# Patient Record
Sex: Female | Born: 1950
Health system: Southern US, Community
[De-identification: ages and names within clinical notes are randomized; demographics above are authoritative.]

## PROBLEM LIST (undated history)

## (undated) DIAGNOSIS — I951 Orthostatic hypotension: Secondary | ICD-10-CM

## (undated) DIAGNOSIS — Z950 Presence of cardiac pacemaker: Secondary | ICD-10-CM

## (undated) DIAGNOSIS — J45909 Unspecified asthma, uncomplicated: Secondary | ICD-10-CM

## (undated) DIAGNOSIS — I495 Sick sinus syndrome: Secondary | ICD-10-CM

## (undated) DIAGNOSIS — S149XXA Injury of unspecified nerves of neck, initial encounter: Secondary | ICD-10-CM

## (undated) DIAGNOSIS — D649 Anemia, unspecified: Secondary | ICD-10-CM

## (undated) DIAGNOSIS — Z9989 Dependence on other enabling machines and devices: Secondary | ICD-10-CM

## (undated) DIAGNOSIS — I639 Cerebral infarction, unspecified: Secondary | ICD-10-CM

## (undated) DIAGNOSIS — K219 Gastro-esophageal reflux disease without esophagitis: Secondary | ICD-10-CM

## (undated) DIAGNOSIS — G709 Myoneural disorder, unspecified: Secondary | ICD-10-CM

## (undated) DIAGNOSIS — L932 Other local lupus erythematosus: Secondary | ICD-10-CM

## (undated) DIAGNOSIS — M8580 Other specified disorders of bone density and structure, unspecified site: Secondary | ICD-10-CM

## (undated) DIAGNOSIS — I5189 Other ill-defined heart diseases: Secondary | ICD-10-CM

## (undated) DIAGNOSIS — M810 Age-related osteoporosis without current pathological fracture: Secondary | ICD-10-CM

## (undated) DIAGNOSIS — M898X9 Other specified disorders of bone, unspecified site: Secondary | ICD-10-CM

## (undated) DIAGNOSIS — I480 Paroxysmal atrial fibrillation: Secondary | ICD-10-CM

## (undated) DIAGNOSIS — F32A Depression, unspecified: Secondary | ICD-10-CM

## (undated) DIAGNOSIS — E785 Hyperlipidemia, unspecified: Secondary | ICD-10-CM

## (undated) DIAGNOSIS — F329 Major depressive disorder, single episode, unspecified: Secondary | ICD-10-CM

## (undated) DIAGNOSIS — M545 Low back pain, unspecified: Secondary | ICD-10-CM

## (undated) DIAGNOSIS — S134XXA Sprain of ligaments of cervical spine, initial encounter: Secondary | ICD-10-CM

## (undated) DIAGNOSIS — S22080A Wedge compression fracture of T11-T12 vertebra, initial encounter for closed fracture: Secondary | ICD-10-CM

## (undated) DIAGNOSIS — I2510 Atherosclerotic heart disease of native coronary artery without angina pectoris: Secondary | ICD-10-CM

## (undated) DIAGNOSIS — E559 Vitamin D deficiency, unspecified: Secondary | ICD-10-CM

## (undated) DIAGNOSIS — I10 Essential (primary) hypertension: Secondary | ICD-10-CM

## (undated) DIAGNOSIS — G4730 Sleep apnea, unspecified: Secondary | ICD-10-CM

## (undated) DIAGNOSIS — G4733 Obstructive sleep apnea (adult) (pediatric): Secondary | ICD-10-CM

## (undated) DIAGNOSIS — F419 Anxiety disorder, unspecified: Secondary | ICD-10-CM

## (undated) DIAGNOSIS — T50905A Adverse effect of unspecified drugs, medicaments and biological substances, initial encounter: Secondary | ICD-10-CM

## (undated) DIAGNOSIS — T7840XA Allergy, unspecified, initial encounter: Secondary | ICD-10-CM

## (undated) DIAGNOSIS — I509 Heart failure, unspecified: Secondary | ICD-10-CM

## (undated) DIAGNOSIS — G8929 Other chronic pain: Secondary | ICD-10-CM

## (undated) DIAGNOSIS — M5020 Other cervical disc displacement, unspecified cervical region: Secondary | ICD-10-CM

## (undated) DIAGNOSIS — G589 Mononeuropathy, unspecified: Secondary | ICD-10-CM

## (undated) DIAGNOSIS — M1990 Unspecified osteoarthritis, unspecified site: Secondary | ICD-10-CM

## (undated) HISTORY — DX: Paroxysmal atrial fibrillation: I48.0

## (undated) HISTORY — DX: Age-related osteoporosis without current pathological fracture: M81.0

## (undated) HISTORY — DX: Vitamin D deficiency, unspecified: E55.9

## (undated) HISTORY — DX: Other ill-defined heart diseases: I51.89

## (undated) HISTORY — DX: Major depressive disorder, single episode, unspecified: F32.9

## (undated) HISTORY — DX: Anemia, unspecified: D64.9

## (undated) HISTORY — DX: Allergy, unspecified, initial encounter: T78.40XA

## (undated) HISTORY — PX: APPENDECTOMY: SHX54

## (undated) HISTORY — PX: COLONOSCOPY: SHX174

## (undated) HISTORY — PX: INSERT / REPLACE / REMOVE PACEMAKER: SUR710

## (undated) HISTORY — DX: Orthostatic hypotension: I95.1

## (undated) HISTORY — DX: Presence of cardiac pacemaker: Z95.0

## (undated) HISTORY — PX: FOREARM FRACTURE SURGERY: SHX649

## (undated) HISTORY — DX: Gastro-esophageal reflux disease without esophagitis: K21.9

## (undated) HISTORY — DX: Wedge compression fracture of t11-T12 vertebra, initial encounter for closed fracture: S22.080A

## (undated) HISTORY — DX: Sleep apnea, unspecified: G47.30

## (undated) HISTORY — DX: Other specified disorders of bone, unspecified site: M89.8X9

## (undated) HISTORY — DX: Atherosclerotic heart disease of native coronary artery without angina pectoris: I25.10

## (undated) HISTORY — DX: Sick sinus syndrome: I49.5

## (undated) HISTORY — DX: Unspecified osteoarthritis, unspecified site: M19.90

## (undated) HISTORY — DX: Other cervical disc displacement, unspecified cervical region: M50.20

## (undated) HISTORY — DX: Sprain of ligaments of cervical spine, initial encounter: S13.4XXA

## (undated) HISTORY — DX: Mononeuropathy, unspecified: G58.9

## (undated) HISTORY — PX: FOREARM HARDWARE REMOVAL: SHX1675

## (undated) HISTORY — DX: Other specified disorders of bone density and structure, unspecified site: M85.80

## (undated) HISTORY — DX: Cerebral infarction, unspecified: I63.9

## (undated) HISTORY — DX: Anxiety disorder, unspecified: F41.9

## (undated) HISTORY — DX: Injury of unspecified nerves of neck, initial encounter: S14.9XXA

## (undated) HISTORY — DX: Depression, unspecified: F32.A

## (undated) HISTORY — DX: Essential (primary) hypertension: I10

---

## 2010-06-07 DIAGNOSIS — S134XXA Sprain of ligaments of cervical spine, initial encounter: Secondary | ICD-10-CM

## 2010-06-07 HISTORY — DX: Sprain of ligaments of cervical spine, initial encounter: S13.4XXA

## 2014-01-06 LAB — HM MAMMOGRAPHY: HM Mammogram: NORMAL

## 2014-08-14 ENCOUNTER — Encounter: Payer: Self-pay | Admitting: Behavioral Health

## 2014-08-14 ENCOUNTER — Telehealth: Payer: Self-pay | Admitting: Behavioral Health

## 2014-08-14 NOTE — Addendum Note (Signed)
Addended by: Eduard Roux E on: 08/14/2014 03:03 PM   Modules accepted: Medications

## 2014-08-14 NOTE — Telephone Encounter (Signed)
Pre-Visit Call completed with patient and chart updated.   Pre-Visit Info documented in Specialty Comments under [employer].    

## 2014-08-14 NOTE — Telephone Encounter (Signed)
Per the patient's husband, he will have his wife to return the call later.

## 2014-08-15 ENCOUNTER — Ambulatory Visit (INDEPENDENT_AMBULATORY_CARE_PROVIDER_SITE_OTHER): Payer: Managed Care, Other (non HMO) | Admitting: Physician Assistant

## 2014-08-15 ENCOUNTER — Encounter: Payer: Self-pay | Admitting: Physician Assistant

## 2014-08-15 ENCOUNTER — Telehealth: Payer: Self-pay | Admitting: *Deleted

## 2014-08-15 VITALS — BP 124/50 | HR 61 | Temp 98.0°F | Ht 66.5 in | Wt 212.4 lb

## 2014-08-15 DIAGNOSIS — M25659 Stiffness of unspecified hip, not elsewhere classified: Secondary | ICD-10-CM

## 2014-08-15 DIAGNOSIS — H8110 Benign paroxysmal vertigo, unspecified ear: Secondary | ICD-10-CM | POA: Diagnosis not present

## 2014-08-15 DIAGNOSIS — I10 Essential (primary) hypertension: Secondary | ICD-10-CM | POA: Diagnosis not present

## 2014-08-15 DIAGNOSIS — F32A Depression, unspecified: Secondary | ICD-10-CM

## 2014-08-15 DIAGNOSIS — F419 Anxiety disorder, unspecified: Secondary | ICD-10-CM

## 2014-08-15 DIAGNOSIS — M25619 Stiffness of unspecified shoulder, not elsewhere classified: Secondary | ICD-10-CM | POA: Diagnosis not present

## 2014-08-15 DIAGNOSIS — F418 Other specified anxiety disorders: Secondary | ICD-10-CM

## 2014-08-15 DIAGNOSIS — F329 Major depressive disorder, single episode, unspecified: Secondary | ICD-10-CM

## 2014-08-15 LAB — RHEUMATOID FACTOR: Rhuematoid fact SerPl-aCnc: <10 IU/mL (ref <=14)

## 2014-08-15 MED ORDER — MECLIZINE HCL 25 MG PO TABS
25.0000 mg | ORAL_TABLET | Freq: Three times a day (TID) | ORAL | Status: DC | PRN
Start: 2014-08-15 — End: 2015-11-24

## 2014-08-15 MED ORDER — MECLIZINE HCL 25 MG PO TABS: 25.0000 mg | ORAL_TABLET | Freq: Three times a day (TID) | ORAL | Status: DC | PRN

## 2014-08-15 NOTE — Patient Instructions (Signed)
Please take Meclizine as directed if needed for dizziness. Stay well hydrated but limit salt intake. If symptoms are not improving we will proceed with imaging. You can try the exercises below to see if they help some with symptoms.  Please go to the lab.  I will call you with your results. We will treat based on findings. Continue your Arctic Spray to help with pain. Stay active.  Please let me know when you are needing refills of chronic medications so that we can get them put under my name.  Welcome to [facility]!

## 2014-08-15 NOTE — Telephone Encounter (Signed)
Medical records received via mail from [facility]. Forwarded to [place]. JG//CMA

## 2014-08-15 NOTE — Progress Notes (Signed)
Patient presents to clinic today to establish care.   Patient with history of hypertension, currently on combination of atenolol 100 mg, amlodipine 10 mg and Diovan-HCT 320-25 mg daily. Patient denies chest pain, palpitations, lightheadedness, dizziness, vision changes or frequent headaches.  Patient also with history of anxiety and depression well-controlled with Zoloft 100 mg daily. Xanax PRN. Denies SI/HI. Denies panic attack.  Is currently on vitamin D supplementation.   Patient endorses positional dizziness over the past week. Denies trauma or injury. Denies lightheadedness, shortness of breath, chest pain or palpitations.  Endorses shoulder and hip stiffness with difficulty standing from seated position.  Past Medical History  Diagnosis Date  . Whiplash injury 06/07/2010  . Bronchitis   . Hypertension   . Anxiety   . Vitamin D deficiency   . Degenerative disorder of bone   . Herniated disc, cervical   . Arthritis     neck and back  . Pinched nerve in neck   . Sleep apnea   . GERD (gastroesophageal reflux disease)   . Osteopenia   . Depression   . Obstructive sleep apnea     Current Outpatient Prescriptions on File Prior to Visit  Medication Sig Dispense Refill  . amLODipine (NORVASC) 10 MG tablet Take 10 mg by mouth daily.    Marland Kitchen atenolol (TENORMIN) 100 MG tablet Take 100 mg by mouth daily.    . Calcium Carbonate-Vitamin D (CALCIUM 600+D) 600-200 MG-UNIT TABS Take 1 tablet by mouth daily.    . Cholecalciferol (VITAMIN D-3 PO) Take by mouth daily.    . Ergocalciferol (VITAMIN D2 PO) Take by mouth once a week.    . sertraline (ZOLOFT) 100 MG tablet Take 100 mg by mouth daily.     No current facility-administered medications on file prior to visit.    Allergies  Allergen Reactions  . Ace Inhibitors Cough  . Sulfur Nausea And Vomiting    Family History  Problem Relation Age of Onset  . Cancer Mother   . Stroke Father   . Hypertension Father   . Diabetes Neg Hx      Social History   Social History  . Marital Status: Married    Spouse Name: N/A  . Number of Children: N/A  . Years of Education: N/A   Social History Main Topics  . Smoking status: Former Smoker    Quit date: 09/06/2013  . Smokeless tobacco: None     Comment: Pt started to use vaporizer-uses rarely  . Alcohol Use: 0.0 oz/week    0 Standard drinks or equivalent per week     Comment: Pt drinks wine 1-2 per month  . Drug Use: No  . Sexual Activity: Not Asked   Other Topics Concern  . None   Social History Narrative   Review of Systems  Constitutional: Negative for fever and weight loss.  Eyes: Negative for blurred vision and double vision.  Cardiovascular: Negative for chest pain and palpitations.  Neurological: Negative for dizziness, loss of consciousness and headaches.  Psychiatric/Behavioral: Positive for depression. Negative for suicidal ideas, hallucinations and substance abuse. The patient is not nervous/anxious and does not have insomnia.    BP 124/50 mmHg  Pulse 61  Temp(Src) 98 F (36.7 C) (Oral)  Ht 5' 6.5" (1.689 m)  Wt 212 lb 6.4 oz (96.344 kg)  BMI 33.77 kg/m2  SpO2 98%  Physical Exam  Constitutional: She is oriented to person, place, and time and well-developed, well-nourished, and in no distress.  HENT:  Head: Normocephalic and atraumatic.  Right Ear: External ear normal.  Left Ear: External ear normal.  Nose: Nose normal.  Mouth/Throat: Oropharynx is clear and moist.  Eyes: Conjunctivae are normal.  Neck: Neck supple.  Cardiovascular: Normal rate, regular rhythm, normal heart sounds and intact distal pulses.   Pulmonary/Chest: Effort normal and breath sounds normal. No respiratory distress. She has no wheezes. She has no rales. She exhibits no tenderness.  Lymphadenopathy:    She has no cervical adenopathy.  Neurological: She is alert and oriented to person, place, and time.  Skin: Skin is warm and dry. No rash noted.  Psychiatric: Affect  normal.  Vitals reviewed.   Recent Results (from the past 2160 hour(s))  TSH     Status: None   Collection Time: 08/15/14  3:10 PM  Result Value Ref Range   TSH 1.15 0.35 - 4.50 uIU/mL  Rheumatoid Factor     Status: None   Collection Time: 08/15/14  3:10 PM  Result Value Ref Range   Rhuematoid fact SerPl-aCnc <10 <=14 IU/mL    Comment:                            Interpretive Table                     Low Positive: 15 - 41 IU/mL                     High Positive:  >= 42 IU/mL    In addition to the RF result, and clinical symptoms including joint  involvement, the 2010 ACR Classification Criteria for  scoring/diagnosing Rheumatoid Arthritis include the results of the  following tests:  CRP (36144), ESR (15010), and CCP (APCA) (31540).  www.rheumatology.org/practice/clinical/classification/ra/ra_2010.asp   CRP High sensitivity     Status: None   Collection Time: 08/15/14  3:10 PM  Result Value Ref Range   CRP, High Sensitivity 2.780 0.000 - 5.000 mg/L    Comment: Note:  An elevated hs-CRP (>5 mg/L) should be repeated after 2 weeks to rule out recent infection or trauma.    Assessment/Plan: Essential hypertension Asymptomatic. BP well controlled. Medications refilled. Continue current regimen.  Benign paroxysmal positional vertigo Rx Meclizine. Epley maneuvers discussed. Handout given. PT for vestibular rehabilitation if not improving.  Anxiety and depression Well-controlled with current regimen. Medications refilled.  Hip stiffness Long-standing history. OA versus PMR. WIll check TSH, RF and CRP today.

## 2014-08-15 NOTE — Progress Notes (Signed)
Pre visit review using our clinic review tool, if applicable. No additional management support is needed unless otherwise documented below in the visit note. 

## 2014-08-16 ENCOUNTER — Telehealth: Payer: Self-pay | Admitting: *Deleted

## 2014-08-16 DIAGNOSIS — M25551 Pain in right hip: Secondary | ICD-10-CM

## 2014-08-16 DIAGNOSIS — M25552 Pain in left hip: Principal | ICD-10-CM

## 2014-08-16 LAB — TSH: TSH: 1.15 u[IU]/mL (ref 0.35–4.50)

## 2014-08-16 LAB — HIGH SENSITIVITY CRP: CRP, High Sensitivity: 2.78 mg/L (ref 0.000–5.000)

## 2014-08-16 NOTE — Telephone Encounter (Signed)
Called and spoke with the pt and informed her of recent lab results and note.  Pt verbalized understanding and agreed to the referral to the Ortho.//AB/CMA

## 2014-08-16 NOTE — Telephone Encounter (Signed)
-----   Message from Brunetta Jeans, PA-C sent at 08/16/2014 12:33 PM EDT ----- All labs are good. No sign of inflammation to indicate autoimmune or rheumatological condition. Would recommend Ortho referral for further assessment and management.

## 2014-08-17 NOTE — Telephone Encounter (Signed)
Referral placed.

## 2014-08-21 DIAGNOSIS — H8110 Benign paroxysmal vertigo, unspecified ear: Secondary | ICD-10-CM | POA: Insufficient documentation

## 2014-08-21 DIAGNOSIS — F419 Anxiety disorder, unspecified: Secondary | ICD-10-CM | POA: Insufficient documentation

## 2014-08-21 DIAGNOSIS — I10 Essential (primary) hypertension: Secondary | ICD-10-CM | POA: Insufficient documentation

## 2014-08-21 DIAGNOSIS — F329 Major depressive disorder, single episode, unspecified: Secondary | ICD-10-CM | POA: Insufficient documentation

## 2014-08-21 DIAGNOSIS — M25659 Stiffness of unspecified hip, not elsewhere classified: Secondary | ICD-10-CM | POA: Insufficient documentation

## 2014-08-21 NOTE — Assessment & Plan Note (Signed)
Well-controlled with current regimen. Medications refilled.

## 2014-08-21 NOTE — Assessment & Plan Note (Signed)
Long-standing history. OA versus PMR. WIll check TSH, RF and CRP today.

## 2014-08-21 NOTE — Assessment & Plan Note (Signed)
Rx Meclizine. Epley maneuvers discussed. Handout given. PT for vestibular rehabilitation if not improving.

## 2014-08-21 NOTE — Assessment & Plan Note (Signed)
Asymptomatic. BP well controlled. Medications refilled. Continue current regimen.

## 2014-10-17 ENCOUNTER — Encounter: Payer: Self-pay | Admitting: Physical Therapy

## 2014-10-17 ENCOUNTER — Ambulatory Visit: Payer: Managed Care, Other (non HMO) | Attending: Orthopedic Surgery | Admitting: Physical Therapy

## 2014-10-17 DIAGNOSIS — M25552 Pain in left hip: Secondary | ICD-10-CM

## 2014-10-17 DIAGNOSIS — M545 Low back pain, unspecified: Secondary | ICD-10-CM

## 2014-10-17 DIAGNOSIS — M25551 Pain in right hip: Secondary | ICD-10-CM | POA: Diagnosis present

## 2014-10-17 NOTE — Therapy (Signed)
[address] [place], [place], [address] Phone: [phone]   Fax:  [phone]  Physical Therapy Evaluation  Patient Details  Name: Shannon [name] MRN: [id number] Date of Birth: Mar 28, 1950 Referring Provider:  Rod Can, MD  Encounter Date: 10/17/2014      PT End of Session - 10/17/14 1348    Visit Number 1   Date for PT Re-Evaluation 12/17/14   PT Start Time 0630   PT Stop Time 1410   PT Time Calculation (min) 51 min   Activity Tolerance Patient tolerated treatment well   Behavior During Therapy [facility] for tasks assessed/performed      Past Medical History  Diagnosis Date  . Whiplash injury 06/07/2010  . Bronchitis   . Hypertension   . Anxiety   . Vitamin D deficiency   . Degenerative disorder of bone   . Herniated disc, cervical   . Arthritis     neck and back  . Pinched nerve in neck   . Sleep apnea   . GERD (gastroesophageal reflux disease)   . Osteopenia   . Depression   . Obstructive sleep apnea     Past Surgical History  Procedure Laterality Date  . Appendectomy    . Arm surgery Left     There were no vitals filed for this visit.  Visit Diagnosis:  Hip pain, left - Plan: PT plan of care cert/re-cert  Hip pain, right - Plan: PT plan of care cert/re-cert  Midline low back pain without sciatica - Plan: PT plan of care cert/re-cert      Subjective Assessment - 10/17/14 1322    Subjective Reports that she has had some low back pain for a number of years, but reports that the hips have started having pain in the last 6 months.     Limitations Sitting;Standing;Walking   How long can you sit comfortably? 2 hours   Diagnostic tests x-rays   Patient Stated Goals have less pain   Currently in Pain? Yes   Pain Score 3    Pain Location Back  and hips   Pain Orientation Right;Left;Lower   Pain Descriptors / Indicators Aching;Tender;Sore   Pain Onset More than a month ago   Pain  Frequency Constant   Aggravating Factors  sitting, stnading and walking 10/10   Pain Relieving Factors pain medication and lying down, pain at best a 3/10   Effect of Pain on Daily Activities can't sit, stand or walk            [facility] PT Assessment - 10/17/14 0001    Assessment   Medical Diagnosis low back pain, and bialteral hip pain   Onset Date/Surgical Date 10/04/14   Prior Therapy chiropractic in the past   Precautions   Precautions None   Balance Screen   Has the patient fallen in the past 6 months No   Has the patient had a decrease in activity level because of a fear of falling?  No   Is the patient reluctant to leave their home because of a fear of falling?  No   Home Environment   Additional Comments housework   Prior Function   Level of Independence Independent   Vocation Retired   Leisure has grandchildren, does not exercise   Posture/Postural Control   Posture Comments fwd head, rounded shoulders, slouched sitting, sits with weigth off of the right side   AROM   Overall AROM Comments Lumbar ROM decreased  25% with some tightness, hip ROM WFL's   Strength   Overall Strength Comments 4-/5 for the hips with some increased pain int he GT area and in the lwo back   Flexibility   Soft Tissue Assessment /Muscle Length --  very tight HS, piriformis and ITB   Palpation   Palpation comment tight and tender in the lumbar area, tender in the /GT area   Special Tests    Special Tests --  flexion exercises decrease pain                   [facility] Adult PT Treatment/Exercise - 10/17/14 0001    Modalities   Modalities Moist Heat;Electrical Stimulation   Moist Heat Therapy   Number Minutes Moist Heat 15 Minutes   Moist Heat Location Hip;Lumbar Spine   Electrical Stimulation   Electrical Stimulation Location hip and low back bilateral   Electrical Stimulation Action premod   Electrical Stimulation Parameters tolerance   Electrical Stimulation Goals Pain                 PT Education - 10/17/14 1347    Education provided Yes   Education Details gave flexibility exercises for HS, piriformis mms and ITB   Person(s) Educated Patient   Methods Explanation;Demonstration;Handout   Comprehension Verbalized understanding          PT Short Term Goals - 10/17/14 1449    PT SHORT TERM GOAL #1   Title independent with initial HEP   Time 2   Period Weeks   Status New           PT Long Term Goals - 10/17/14 1449    PT LONG TERM GOAL #1   Title understand proper posture and body mechanics   Time 8   Period Weeks   Status New   PT LONG TERM GOAL #2   Title decrease pain 50%   Time 8   Period Weeks   Status New   PT LONG TERM GOAL #3   Title increase lumbar ROM to WNL's   Time 8   Period Weeks   Status New   PT LONG TERM GOAL #4   Title sit with 50% less pain   Time 8   Period Weeks   Status New               Plan - 10/17/14 1349    Clinical Impression Statement Patient with low back and bilateral hip pain.  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  Tight HS and ITB.  weak core   Rehab Potential Good   PT Frequency 2x / week   PT Duration 8 weeks   PT Next Visit Plan May add ionto and exercises   Consulted and Agree with Plan of Care Patient         Problem List Patient Active Problem List   Diagnosis Date Noted  . Benign paroxysmal positional vertigo 08/21/2014  . Essential hypertension 08/21/2014  . Anxiety and depression 08/21/2014  . Hip stiffness 08/21/2014    Sumner Boast., PT 10/17/2014, 2:55 PM  [address] [place], [place], [address] Phone: [phone]   Fax:  [phone]

## 2014-10-17 NOTE — Patient Instructions (Signed)
Knee-to-Chest: with Neck Flexion Stretch (Supine)   Pull left knee to chest, tucking chin and lifting head. Hold __10__ seconds. Relax. Repeat _10___ times per set. Do _2___ sets per session. Do __2__ sessions per day.  Trunk: Knees to Chest   Lie on firm, flat surface. Keep head and shoulders flat on surface. Tuck hands behind knees and pull to chest. Hold _10___ seconds. Repeat __10__ times. Do _2___ sessions per day. CAUTION: Movement should be gentle and slow.  Caudal Rotation: Hip Roll, Neutral Lordosis - Supine   Lie with knees bent and slightly elevated, feet flat. Tighten stomach, lower knees out to right side, rotating hips and trunk. Keep stomach tight for return. Repeat _10___ times per set. Do __2__ sets per session. Do _2___ sessions per week.  Pelvic Tilt: Anterior - Legs Bent (Supine)   Rotate pelvis up and arch back. Hold ____ seconds. Relax. Repeat ____ times per set. Do ____ sets per session. Do ____ sessions per day.  Piriformis Stretch   Lying on back, pull right knee toward opposite shoulder. Hold __30__ seconds. Repeat _4___ times. Do _2___ sessions per day.  

## 2014-10-23 ENCOUNTER — Ambulatory Visit: Payer: Managed Care, Other (non HMO) | Admitting: Physical Therapy

## 2014-10-26 ENCOUNTER — Encounter: Payer: Self-pay | Admitting: Physical Therapy

## 2014-10-26 ENCOUNTER — Ambulatory Visit: Payer: Managed Care, Other (non HMO) | Admitting: Physical Therapy

## 2014-10-26 DIAGNOSIS — M545 Low back pain, unspecified: Secondary | ICD-10-CM

## 2014-10-26 DIAGNOSIS — M25551 Pain in right hip: Secondary | ICD-10-CM

## 2014-10-26 DIAGNOSIS — M25552 Pain in left hip: Secondary | ICD-10-CM | POA: Diagnosis not present

## 2014-10-26 NOTE — Therapy (Signed)
[address] [place], [place], [address] Phone: [phone]   Fax:  [phone]  Physical Therapy Treatment  Patient Details  Name: Shannon [name] MRN: [id number] Date of Birth: Mar 03, 1950 No Data Recorded  Encounter Date: 10/26/2014      PT End of Session - 10/26/14 1516    Visit Number 2   PT Start Time 7902   PT Stop Time 1530   PT Time Calculation (min) 62 min   Activity Tolerance Patient tolerated treatment well   Behavior During Therapy [facility] for tasks assessed/performed      Past Medical History  Diagnosis Date  . Whiplash injury 06/07/2010  . Bronchitis   . Hypertension   . Anxiety   . Vitamin D deficiency   . Degenerative disorder of bone   . Herniated disc, cervical   . Arthritis     neck and back  . Pinched nerve in neck   . Sleep apnea   . GERD (gastroesophageal reflux disease)   . Osteopenia   . Depression   . Obstructive sleep apnea     Past Surgical History  Procedure Laterality Date  . Appendectomy    . Arm surgery Left     There were no vitals filed for this visit.  Visit Diagnosis:  Hip pain, right  Midline low back pain without sciatica  Hip pain, left      Subjective Assessment - 10/26/14 1428    Subjective Pt reports that between her exercises and medication things has really helped. She has more mobility and less pain when she sleeps.     Patient Stated Goals have less pain   Currently in Pain? Yes   Pain Score 3   LB 8/10   Pain Orientation Right;Left   Pain Descriptors / Indicators Dull;Aching   Pain Onset More than a month ago                         [facility] Adult PT Treatment/Exercise - 10/26/14 0001    Exercises   Exercises Knee/Hip   Knee/Hip Exercises: Stretches   Passive Hamstring Stretch 5 reps;10 seconds   ITB Stretch 5 reps;10 seconds   Piriformis Stretch 5 reps;10 seconds   Knee/Hip Exercises: Aerobic   Nustep L4 x81mn     Knee/Hip Exercises: Standing   Hip Abduction AROM;1 set;15 reps;Both;Knee straight   Hip Extension AROM;Both;1 set;15 reps;Knee straight   Forward Step Up 10 reps;Step Height: 6";1 set   Knee/Hip Exercises: Seated   Long Arc Quad Both;2 sets;10 reps  On sit fit    Long Arc Quad Weight 3 lbs.   Marching Strengthening;Both;2 sets;10 reps  on sit fit   Marching Weights 3 lbs.   Hamstring Curl Strengthening;Both;2 sets;15 reps   Hamstring Limitations red Tband    Abduction/Adduction  2 sets;20 reps   Abd/Adduction Limitations black Tband    Sit to Sand 10 reps;without UE support;2 sets   Modalities   Modalities Moist Heat;Electrical Stimulation   Moist Heat Therapy   Number Minutes Moist Heat 15 Minutes   Moist Heat Location Hip;Lumbar Spine   Electrical Stimulation   Electrical Stimulation Location hip and low back bilateral   Electrical Stimulation Action premod    Electrical Stimulation Parameters to tolerance    Electrical Stimulation Goals Pain                  PT Short Term Goals -  10/26/14 1518    PT SHORT TERM GOAL #1   Title independent with initial HEP   Status Partially Met           PT Long Term Goals - 10/17/14 1449    PT LONG TERM GOAL #1   Title understand proper posture and body mechanics   Time 8   Period Weeks   Status New   PT LONG TERM GOAL #2   Title decrease pain 50%   Time 8   Period Weeks   Status New   PT LONG TERM GOAL #3   Title increase lumbar ROM to WNL's   Time 8   Period Weeks   Status New   PT LONG TERM GOAL #4   Title sit with 50% less pain   Time 8   Period Weeks   Status New               Plan - 10/26/14 1517    Clinical Impression Statement Pt reports improvement overall increase mobility and decrease pain. Tolerated gym level exercises with Tband, ankle weights, and AROM well.   Rehab Potential Good   PT Frequency 2x / week   PT Duration 8 weeks   PT Next Visit Plan progress with exercises         Problem List Patient Active Problem List   Diagnosis Date Noted  . Benign paroxysmal positional vertigo 08/21/2014  . Essential hypertension 08/21/2014  . Anxiety and depression 08/21/2014  . Hip stiffness 08/21/2014    Scot Jun, PTA  10/26/2014, 3:21 PM  [address] [place], [place], [address] Phone: [phone]   Fax:  [phone]  Name: Shannon [name] MRN: [id number] Date of Birth: June 13, 1950

## 2014-10-27 ENCOUNTER — Ambulatory Visit (INDEPENDENT_AMBULATORY_CARE_PROVIDER_SITE_OTHER): Payer: Managed Care, Other (non HMO) | Admitting: Physician Assistant

## 2014-10-27 ENCOUNTER — Encounter: Payer: Self-pay | Admitting: Physician Assistant

## 2014-10-27 VITALS — BP 132/58 | HR 65 | Temp 98.2°F | Resp 16 | Ht 67.0 in | Wt 215.2 lb

## 2014-10-27 DIAGNOSIS — R5383 Other fatigue: Secondary | ICD-10-CM

## 2014-10-27 LAB — CBC
HCT: 36.2 % (ref 36.0–46.0)
HEMOGLOBIN: 11.9 g/dL — AB (ref 12.0–15.0)
MCH: 28.9 pg (ref 26.0–34.0)
MCHC: 32.9 g/dL (ref 30.0–36.0)
MCV: 87.9 fL (ref 78.0–100.0)
MPV: 9.3 fL (ref 8.6–12.4)
PLATELETS: 331 10*3/uL (ref 150–400)
RBC: 4.12 MIL/uL (ref 3.87–5.11)
RDW: 14.1 % (ref 11.5–15.5)
WBC: 6.6 10*3/uL (ref 4.0–10.5)

## 2014-10-27 LAB — T4, FREE: Free T4: 1.04 ng/dL (ref 0.80–1.80)

## 2014-10-27 LAB — TSH: TSH: 1.507 u[IU]/mL (ref 0.350–4.500)

## 2014-10-27 LAB — VITAMIN B12: VITAMIN B 12: 430 pg/mL (ref 211–911)

## 2014-10-27 NOTE — Patient Instructions (Addendum)
Please go to the lab for blood work. I will call with results. Stop the Diclofenac and continue the other pain medication.  Follow-up with Rheumatology as scheduled.  We will treat based on findings.

## 2014-10-27 NOTE — Progress Notes (Signed)
Pre visit review using our clinic review tool, if applicable. No additional management support is needed unless otherwise documented below in the visit note/SLS  

## 2014-10-28 DIAGNOSIS — R5383 Other fatigue: Secondary | ICD-10-CM | POA: Insufficient documentation

## 2014-10-28 LAB — VITAMIN D 25 HYDROXY (VIT D DEFICIENCY, FRACTURES): VIT D 25 HYDROXY: 49 ng/mL (ref 30–100)

## 2014-10-28 NOTE — Progress Notes (Signed)
Patient presents to clinic today c/o continued fatigue associated with stiffness and weakness in shoulders and hips. Is followed now by Rheumatology. Endorses workup unremarkable thus far. No labs available for review. Has follow-up scheduled this coming week. Is wanting further workup regarding fatigue. Denies hx of anemia or vitamin deficiency.  Past Medical History  Diagnosis Date  . Whiplash injury 06/07/2010  . Bronchitis   . Hypertension   . Anxiety   . Vitamin D deficiency   . Degenerative disorder of bone   . Herniated disc, cervical   . Arthritis     neck and back  . Pinched nerve in neck   . Sleep apnea   . GERD (gastroesophageal reflux disease)   . Osteopenia   . Depression   . Obstructive sleep apnea     Current Outpatient Prescriptions on File Prior to Visit  Medication Sig Dispense Refill  . albuterol (PROVENTIL HFA;VENTOLIN HFA) 108 (90 BASE) MCG/ACT inhaler Inhale 1 puff into the lungs every 4 (four) hours as needed for wheezing or shortness of breath.    . ALPRAZolam (XANAX) 0.5 MG tablet Take 0.5 mg by mouth 3 (three) times daily as needed for anxiety.    Marland Kitchen amLODipine (NORVASC) 10 MG tablet Take 10 mg by mouth daily.    Marland Kitchen atenolol (TENORMIN) 100 MG tablet Take 100 mg by mouth daily.    . Calcium Carbonate-Vitamin D (CALCIUM 600+D) 600-200 MG-UNIT TABS Take 1 tablet by mouth daily.    . Cholecalciferol (VITAMIN D-3 PO) Take by mouth daily.    . meclizine (ANTIVERT) 25 MG tablet Take 1 tablet (25 mg total) by mouth 3 (three) times daily as needed for dizziness. 30 tablet 0  . sertraline (ZOLOFT) 100 MG tablet Take 100 mg by mouth daily.    . valsartan-hydrochlorothiazide (DIOVAN-HCT) 320-25 MG per tablet Take 1 tablet by mouth daily.     No current facility-administered medications on file prior to visit.    Allergies  Allergen Reactions  . Ace Inhibitors Cough  . Sulfur Nausea And Vomiting    Family History  Problem Relation Age of Onset  . Cancer  Mother   . Stroke Father   . Hypertension Father   . Diabetes Neg Hx     Social History   Social History  . Marital Status: Married    Spouse Name: N/A  . Number of Children: N/A  . Years of Education: N/A   Social History Main Topics  . Smoking status: Former Smoker    Quit date: 09/06/2013  . Smokeless tobacco: None     Comment: Pt started to use vaporizer-uses rarely  . Alcohol Use: 0.0 oz/week    0 Standard drinks or equivalent per week     Comment: Pt drinks wine 1-2 per month  . Drug Use: No  . Sexual Activity: Not Asked   Other Topics Concern  . None   Social History Narrative    Review of Systems - See HPI.  All other ROS are negative.  BP 132/58 mmHg  Pulse 65  Temp(Src) 98.2 F (36.8 C) (Oral)  Resp 16  Ht '5\' 7"'  (1.702 m)  Wt 215 lb 4 oz (97.637 kg)  BMI 33.71 kg/m2  SpO2 98%  Physical Exam  Constitutional: She is oriented to person, place, and time and well-developed, well-nourished, and in no distress.  HENT:  Head: Normocephalic and atraumatic.  Eyes: Conjunctivae are normal.  Neck: Neck supple.  Cardiovascular: Normal rate, regular rhythm, normal  heart sounds and intact distal pulses.   Pulmonary/Chest: Effort normal and breath sounds normal. No respiratory distress. She has no wheezes. She has no rales. She exhibits no tenderness.  Neurological: She is alert and oriented to person, place, and time.  Skin: Skin is warm and dry. No rash noted.  Vitals reviewed.   Recent Results (from the past 2160 hour(s))  TSH     Status: None   Collection Time: 08/15/14  3:10 PM  Result Value Ref Range   TSH 1.15 0.35 - 4.50 uIU/mL  Rheumatoid Factor     Status: None   Collection Time: 08/15/14  3:10 PM  Result Value Ref Range   Rhuematoid fact SerPl-aCnc <10 <=14 IU/mL    Comment:                            Interpretive Table                     Low Positive: 15 - 41 IU/mL                     High Positive:  >= 42 IU/mL    In addition to the RF  result, and clinical symptoms including joint  involvement, the 2010 ACR Classification Criteria for  scoring/diagnosing Rheumatoid Arthritis include the results of the  following tests:  CRP (14970), ESR (15010), and CCP (APCA) (26378).  www.rheumatology.org/practice/clinical/classification/ra/ra_2010.asp   CRP High sensitivity     Status: None   Collection Time: 08/15/14  3:10 PM  Result Value Ref Range   CRP, High Sensitivity 2.780 0.000 - 5.000 mg/L    Comment: Note:  An elevated hs-CRP (>5 mg/L) should be repeated after 2 weeks to rule out recent infection or trauma.  CBC     Status: Abnormal   Collection Time: 10/27/14  4:08 PM  Result Value Ref Range   WBC 6.6 4.0 - 10.5 K/uL   RBC 4.12 3.87 - 5.11 MIL/uL   Hemoglobin 11.9 (L) 12.0 - 15.0 g/dL   HCT 36.2 36.0 - 46.0 %   MCV 87.9 78.0 - 100.0 fL   MCH 28.9 26.0 - 34.0 pg   MCHC 32.9 30.0 - 36.0 g/dL   RDW 14.1 11.5 - 15.5 %   Platelets 331 150 - 400 K/uL   MPV 9.3 8.6 - 12.4 fL  TSH     Status: None   Collection Time: 10/27/14  4:08 PM  Result Value Ref Range   TSH 1.507 0.350 - 4.500 uIU/mL  T4, free     Status: None   Collection Time: 10/27/14  4:08 PM  Result Value Ref Range   Free T4 1.04 0.80 - 1.80 ng/dL  B12     Status: None   Collection Time: 10/27/14  4:08 PM  Result Value Ref Range   Vitamin B-12 430 211 - 911 pg/mL  Vitamin D (25 hydroxy)     Status: None   Collection Time: 10/27/14  4:08 PM  Result Value Ref Range   Vit D, 25-Hydroxy 49 30 - 100 ng/mL    Comment: Vitamin D Status           25-OH Vitamin D        Deficiency                <20 ng/mL        Insufficiency         20 -  29 ng/mL        Optimal             > or = 30 ng/mL   For 25-OH Vitamin D testing on patients on D2-supplementation and patients for whom quantitation of D2 and D3 fractions is required, the QuestAssureD 25-OH VIT D, (D2,D3), LC/MS/MS is recommended: order code [id number] (patients > 2 yrs).     Assessment/Plan: Other  fatigue Will recheck CBC, TSH, T4. Will also check B12 and Vitamin D level. Still think their is a PMR component giving hips and shoulder girdle symptoms. Will obtain records from Rheumatology to assess further. Encouraged patient to follow-up with specialist this week as scheduled.

## 2014-10-28 NOTE — Assessment & Plan Note (Signed)
Will recheck CBC, TSH, T4. Will also check B12 and Vitamin D level. Still think their is a PMR component giving hips and shoulder girdle symptoms. Will obtain records from Rheumatology to assess further. Encouraged patient to follow-up with specialist this week as scheduled.

## 2014-10-31 ENCOUNTER — Ambulatory Visit: Payer: Managed Care, Other (non HMO) | Admitting: Physical Therapy

## 2014-10-31 ENCOUNTER — Encounter: Payer: Self-pay | Admitting: Physical Therapy

## 2014-10-31 DIAGNOSIS — M25552 Pain in left hip: Secondary | ICD-10-CM | POA: Diagnosis not present

## 2014-10-31 DIAGNOSIS — M545 Low back pain, unspecified: Secondary | ICD-10-CM

## 2014-10-31 DIAGNOSIS — M25551 Pain in right hip: Secondary | ICD-10-CM

## 2014-10-31 NOTE — Therapy (Signed)
[place] [address] [place], [place], [address] Phone: [phone]   Fax:  [phone]  Physical Therapy Treatment  Patient Details  Name: Shannon [name] MRN: [id number] Date of Birth: [birth year] No Data Recorded  Encounter Date: 10/31/2014      PT End of Session - 10/31/14 1307    Visit Number 3   Date for PT Re-Evaluation 12/17/14   PT Start Time 9242   PT Stop Time 1330   PT Time Calculation (min) 55 min      Past Medical History  Diagnosis Date  . Whiplash injury 06/07/2010  . Bronchitis   . Hypertension   . Anxiety   . Vitamin D deficiency   . Degenerative disorder of bone   . Herniated disc, cervical   . Arthritis     neck and back  . Pinched nerve in neck   . Sleep apnea   . GERD (gastroesophageal reflux disease)   . Osteopenia   . Depression   . Obstructive sleep apnea     Past Surgical History  Procedure Laterality Date  . Appendectomy    . Arm surgery Left     There were no vitals filed for this visit.  Visit Diagnosis:  Hip pain, right  Midline low back pain without sciatica  Hip pain, left      Subjective Assessment - 10/31/14 1239    Subjective left knee very swollen from last session, back/hips much better-getting stronger and up and down easier   Currently in Pain? Yes   Pain Score 3    Pain Location Back                         [facility] Adult PT Treatment/Exercise - 10/31/14 0001    Exercises   Exercises Lumbar   Lumbar Exercises: Supine   Ab Set 15 reps;10 reps  3 way with weighted ball   Large Ball Abdominal Isometric Limitations 15 times   Knee/Hip Exercises: Aerobic   Nustep L4 x28mn    Knee/Hip Exercises: Supine   Bridges with Ball Squeeze Strengthening;Both;2 sets;10 reps   Straight Leg Raises Strengthening;Both;1 set;4 sets;15 reps  red tband,2nd set with abd   Other Supine Knee/Hip Exercises bridge with ball, KTC and obl 15 times each   Other Supine Knee/Hip Exercises marching and hip abd red tband 15 times each   Knee/Hip Exercises: Sidelying   Hip ABduction Strengthening;Both;1 set;15 reps  plus CC and CW circles 2#   Clams 15 each  red tband   Modalities   Modalities Moist Heat;Electrical Stimulation   Moist Heat Therapy   Number Minutes Moist Heat 15 Minutes   Moist Heat Location Hip;Lumbar Spine   Electrical Stimulation   Electrical Stimulation Location hip and low back bilateral   Electrical Stimulation Action premod   Electrical Stimulation Goals Pain                  PT Short Term Goals - 10/26/14 1518    PT SHORT TERM GOAL #1   Title independent with initial HEP   Status Partially Met           PT Long Term Goals - 10/17/14 1449    PT LONG TERM GOAL #1   Title understand proper posture and body mechanics   Time 8   Period Weeks   Status New   PT LONG TERM GOAL #2   Title decrease pain  50%   Time 8   Period Weeks   Status New   PT LONG TERM GOAL #3   Title increase lumbar ROM to WNL's   Time 8   Period Weeks   Status New   PT LONG TERM GOAL #4   Title sit with 50% less pain   Time 8   Period Weeks   Status New               Plan - 10/31/14 1307    Clinical Impression Statement pt tolerated ther ex well, did supine core and hip stab vs stadning to see if decreased aggravation to knees   PT Next Visit Plan progress with exercises        Problem List Patient Active Problem List   Diagnosis Date Noted  . Other fatigue 10/28/2014  . Benign paroxysmal positional vertigo 08/21/2014  . Essential hypertension 08/21/2014  . Anxiety and depression 08/21/2014  . Hip stiffness 08/21/2014    PAYSEUR,ANGIE PTA 10/31/2014, 1:09 PM  [place] [address] [place], [place], [address] Phone: [phone]   Fax:  [phone]  Name: Shannon [name] MRN: [id number] Date of Birth: [birth year]

## 2014-11-02 ENCOUNTER — Ambulatory Visit: Payer: Managed Care, Other (non HMO) | Admitting: Physical Therapy

## 2014-11-06 ENCOUNTER — Encounter: Payer: Managed Care, Other (non HMO) | Admitting: Physical Therapy

## 2014-11-07 ENCOUNTER — Encounter: Payer: Self-pay | Admitting: Physical Therapy

## 2014-11-07 ENCOUNTER — Ambulatory Visit: Payer: Managed Care, Other (non HMO) | Attending: Orthopedic Surgery | Admitting: Physical Therapy

## 2014-11-07 DIAGNOSIS — M25552 Pain in left hip: Secondary | ICD-10-CM | POA: Diagnosis present

## 2014-11-07 DIAGNOSIS — M545 Low back pain, unspecified: Secondary | ICD-10-CM

## 2014-11-07 DIAGNOSIS — M25551 Pain in right hip: Secondary | ICD-10-CM | POA: Diagnosis present

## 2014-11-07 NOTE — Therapy (Signed)
[place] [address] [place], [place], [address] Phone: [phone]   Fax:  [phone]  Physical Therapy Treatment  Patient Details  Name: Shannon [name] MRN: [id number] Date of Birth: 09/13/1950 No Data Recorded  Encounter Date: 11/07/2014      PT End of Session - 11/07/14 1442    Visit Number 4   PT Start Time 4944   PT Stop Time 1452   PT Time Calculation (min) 57 min      Past Medical History  Diagnosis Date  . Whiplash injury 06/07/2010  . Bronchitis   . Hypertension   . Anxiety   . Vitamin D deficiency   . Degenerative disorder of bone   . Herniated disc, cervical   . Arthritis     neck and back  . Pinched nerve in neck   . Sleep apnea   . GERD (gastroesophageal reflux disease)   . Osteopenia   . Depression   . Obstructive sleep apnea     Past Surgical History  Procedure Laterality Date  . Appendectomy    . Arm surgery Left     There were no vitals filed for this visit.  Visit Diagnosis:  Hip pain, right  Midline low back pain without sciatica  Hip pain, left      Subjective Assessment - 11/07/14 1423    Subjective Reports knee is less swollen sitll slightly irritated. Pt c/o of increased stiffness and pain in low back and hip from a long car ride yesterday.    Currently in Pain? Yes   Pain Score 6    Pain Location Back   Pain Orientation Lower;Left;Right                         [facility] Adult PT Treatment/Exercise - 11/07/14 0001    Lumbar Exercises: Supine   Large Ball Abdominal Isometric Limitations 15 times   Knee/Hip Exercises: Aerobic   Nustep L4 x68mn    Knee/Hip Exercises: Supine   Bridges with Ball Squeeze Strengthening;Both;2 sets;10 reps   Straight Leg Raises Strengthening;Both;1 set;4 sets;15 reps  red tband,2nd set with abd   Other Supine Knee/Hip Exercises bridge with ball, KTC and obl 15 times each   Other Supine Knee/Hip Exercises marching and  hip abd red tband 15 times each   Knee/Hip Exercises: Sidelying   Clams 15 each  red tband   Modalities   Modalities Moist Heat;Electrical Stimulation   Moist Heat Therapy   Number Minutes Moist Heat 15 Minutes   Moist Heat Location Hip;Lumbar Spine   Electrical Stimulation   Electrical Stimulation Location hip and low back bilateral   Electrical Stimulation Action premod   Electrical Stimulation Goals Pain                  PT Short Term Goals - 10/26/14 1518    PT SHORT TERM GOAL #1   Title independent with initial HEP   Status Partially Met           PT Long Term Goals - 10/17/14 1449    PT LONG TERM GOAL #1   Title understand proper posture and body mechanics   Time 8   Period Weeks   Status New   PT LONG TERM GOAL #2   Title decrease pain 50%   Time 8   Period Weeks   Status New   PT LONG TERM GOAL #3   Title increase  lumbar ROM to WNL's   Time 8   Period Weeks   Status New   PT LONG TERM GOAL #4   Title sit with 50% less pain   Time 8   Period Weeks   Status New               Plan - 11/07/14 1442    Clinical Impression Statement Pt tolerated all lumbar and abdominal exercises without any complaint of increased pain. Pt requires verbal and tactile ques for proper technique.    PT Next Visit Plan Progress to more standing exercises if knee pain is still decreasing.        Problem List Patient Active Problem List   Diagnosis Date Noted  . Other fatigue 10/28/2014  . Benign paroxysmal positional vertigo 08/21/2014  . Essential hypertension 08/21/2014  . Anxiety and depression 08/21/2014  . Hip stiffness 08/21/2014    Crist Fat, SPTA 11/07/2014, 2:46 PM  [place] [facility] [address] [facility] [place], [place], [address] Phone: [phone]   Fax:  [phone]  Name: Shannon [name] MRN: [id number] Date of Birth: [birth year]

## 2014-11-09 ENCOUNTER — Encounter: Payer: Self-pay | Admitting: Physical Therapy

## 2014-11-09 ENCOUNTER — Ambulatory Visit: Payer: Managed Care, Other (non HMO) | Admitting: Physical Therapy

## 2014-11-09 DIAGNOSIS — M545 Low back pain, unspecified: Secondary | ICD-10-CM

## 2014-11-09 DIAGNOSIS — M25551 Pain in right hip: Secondary | ICD-10-CM

## 2014-11-09 NOTE — Therapy (Signed)
[place] [place], [place], [address] Phone: [phone]   Fax:  [phone]  Physical Therapy Treatment  Patient Details  Name: Shannon [name] MRN: [id number] Date of Birth: 04-Jun-1950 No Data Recorded  Encounter Date: 11/09/2014      PT End of Session - 11/09/14 1512    Visit Number 5   Date for PT Re-Evaluation 12/17/14   PT Start Time 1440   PT Stop Time 1535   PT Time Calculation (min) 55 min      Past Medical History  Diagnosis Date  . Whiplash injury 06/07/2010  . Bronchitis   . Hypertension   . Anxiety   . Vitamin D deficiency   . Degenerative disorder of bone   . Herniated disc, cervical   . Arthritis     neck and back  . Pinched nerve in neck   . Sleep apnea   . GERD (gastroesophageal reflux disease)   . Osteopenia   . Depression   . Obstructive sleep apnea     Past Surgical History  Procedure Laterality Date  . Appendectomy    . Arm surgery Left     There were no vitals filed for this visit.  Visit Diagnosis:  Hip pain, right  Midline low back pain without sciatica      Subjective Assessment - 11/09/14 1443    Subjective feeling better than last session   Currently in Pain? Yes   Pain Score 6    Pain Location Back                         [facility] Adult PT Treatment/Exercise - 11/09/14 0001    Lumbar Exercises: Aerobic   Elliptical Nustep L 5 6 min   Lumbar Exercises: Machines for Strengthening   Cybex Lumbar Extension 20# 2 sets 10   Cybex Knee Extension 5 # 2 sets 10  10# too heavy   Cybex Knee Flexion 20# 2 sets 10   Leg Press 30# 2 sets 10   Other Lumbar Machine Exercise ab pull blue tband 2 sets 10   Other Lumbar Machine Exercise seated row 20# 2 sets 15   Knee/Hip Exercises: Standing   Hip Abduction Stengthening;Right;Left;1 set;15 reps;Knee straight  green tband   Hip Extension Stengthening;Right;Left;1 set;15 reps;Knee straight  green  tband   Moist Heat Therapy   Number Minutes Moist Heat 15 Minutes   Moist Heat Location Hip;Lumbar Spine   Electrical Stimulation   Electrical Stimulation Location hip and low back bilateral   Electrical Stimulation Action premod   Electrical Stimulation Goals Pain                  PT Short Term Goals - 11/09/14 1501    PT SHORT TERM GOAL #1   Title independent with initial HEP   Status Achieved           PT Long Term Goals - 11/09/14 1501    PT LONG TERM GOAL #1   Title understand proper posture and body mechanics   Status On-going   PT LONG TERM GOAL #2   Title decrease pain 50%   Status On-going   PT LONG TERM GOAL #3   Title increase lumbar ROM to WNL's   Baseline WFLS except fflexion decreased 25%   Status On-going   PT LONG TERM GOAL #4   Title sit with 50% less pain   Status On-going  Plan - 11/09/14 1513    Clinical Impression Statement pt tolerated ther ex well, VCing for correct BM thru ther ex. Progressing with goals.   PT Next Visit Plan increase HEP        Problem List Patient Active Problem List   Diagnosis Date Noted  . Other fatigue 10/28/2014  . Benign paroxysmal positional vertigo 08/21/2014  . Essential hypertension 08/21/2014  . Anxiety and depression 08/21/2014  . Hip stiffness 08/21/2014    PAYSEUR,ANGIE PTA 11/09/2014, 3:16 PM  [place] [address] [place], [place], [address] Phone: [phone]   Fax:  [phone]  Name: Shannon [name] MRN: [id number] Date of Birth: Oct 17, 1950

## 2014-11-13 ENCOUNTER — Ambulatory Visit (INDEPENDENT_AMBULATORY_CARE_PROVIDER_SITE_OTHER): Payer: Managed Care, Other (non HMO) | Admitting: Medical

## 2014-11-13 ENCOUNTER — Ambulatory Visit: Payer: Managed Care, Other (non HMO) | Admitting: Physical Therapy

## 2014-11-13 ENCOUNTER — Encounter: Payer: Self-pay | Admitting: Medical

## 2014-11-13 VITALS — BP 112/70 | HR 88 | Temp 98.8°F | Ht 67.0 in | Wt 216.0 lb

## 2014-11-13 DIAGNOSIS — J3489 Other specified disorders of nose and nasal sinuses: Secondary | ICD-10-CM

## 2014-11-13 DIAGNOSIS — M791 Myalgia: Secondary | ICD-10-CM

## 2014-11-13 DIAGNOSIS — IMO0001 Reserved for inherently not codable concepts without codable children: Secondary | ICD-10-CM

## 2014-11-13 DIAGNOSIS — R059 Cough, unspecified: Secondary | ICD-10-CM

## 2014-11-13 DIAGNOSIS — J040 Acute laryngitis: Secondary | ICD-10-CM | POA: Diagnosis not present

## 2014-11-13 DIAGNOSIS — R05 Cough: Secondary | ICD-10-CM

## 2014-11-13 DIAGNOSIS — M609 Myositis, unspecified: Secondary | ICD-10-CM

## 2014-11-13 DIAGNOSIS — J028 Acute pharyngitis due to other specified organisms: Secondary | ICD-10-CM | POA: Diagnosis not present

## 2014-11-13 LAB — POCT INFLUENZA A/B: INFLUENZA A, POC: NEGATIVE

## 2014-11-13 LAB — POCT RAPID STREP A (OFFICE): Rapid Strep A Screen: NEGATIVE

## 2014-11-13 MED ORDER — BENZONATATE 100 MG PO CAPS: 100.0000 mg | ORAL_CAPSULE | Freq: Three times a day (TID) | ORAL | Status: DC | PRN

## 2014-11-13 MED ORDER — AZITHROMYCIN 250 MG PO TABS: ORAL_TABLET | ORAL | Status: DC

## 2014-11-13 MED ORDER — FLUTICASONE PROPIONATE 50 MCG/ACT NA SUSP: 2.0000 | Freq: Every day | NASAL | Status: DC

## 2014-11-13 NOTE — Progress Notes (Signed)
Pre visit review using our clinic review tool, if applicable. No additional management support is needed unless otherwise documented below in the visit note. 

## 2014-11-13 NOTE — Progress Notes (Signed)
Subjective:    Patient ID: Shannon [name], female    DOB: 10-16-1950, 64 y.o.   MRN: [id number]  HPI  One day of pnd, laryngiitis, sinus pressure and st. Mild pain on swallowing. Symptom for last 24 hours.  Last 2 years ago bronchitis each year.  Pt quit smoking almost a year ago.   Review of Systems  Constitutional: Negative for fever and chills.  HENT: Positive for congestion, sinus pressure, sneezing, sore throat and voice change. Negative for trouble swallowing.   Respiratory: Positive for cough. Negative for chest tightness, shortness of breath and wheezing.   Cardiovascular: Negative for chest pain and palpitations.  Gastrointestinal: Negative for abdominal pain.  Musculoskeletal: Positive for back pain.       Hx of back pain. Little more than usual. Not flu like now.  Skin: Negative for pallor and rash.  Neurological: Negative for dizziness, weakness and headaches.  Hematological: Negative for adenopathy. Does not bruise/bleed easily.  Psychiatric/Behavioral: Negative for behavioral problems and confusion.    Past Medical History  Diagnosis Date  . Whiplash injury 06/07/2010  . Bronchitis   . Hypertension   . Anxiety   . Vitamin D deficiency   . Degenerative disorder of bone   . Herniated disc, cervical   . Arthritis     neck and back  . Pinched nerve in neck   . Sleep apnea   . GERD (gastroesophageal reflux disease)   . Osteopenia   . Depression   . Obstructive sleep apnea     Social History   Social History  . Marital Status: Married    Spouse Name: N/A  . Number of Children: N/A  . Years of Education: N/A   Occupational History  . Not on file.   Social History Main Topics  . Smoking status: Former Smoker    Quit date: 09/06/2013  . Smokeless tobacco: Not on file     Comment: Pt started to use vaporizer-uses rarely  . Alcohol Use: 0.0 oz/week    0 Standard drinks or equivalent per week     Comment: Pt drinks wine 1-2 per month  . Drug Use: No    . Sexual Activity: Not on file   Other Topics Concern  . Not on file   Social History Narrative    Past Surgical History  Procedure Laterality Date  . Appendectomy    . Arm surgery Left     Family History  Problem Relation Age of Onset  . Cancer Mother   . Stroke Father   . Hypertension Father   . Diabetes Neg Hx     Allergies  Allergen Reactions  . Ace Inhibitors Cough  . Sulfur Nausea And Vomiting    Current Outpatient Prescriptions on File Prior to Visit  Medication Sig Dispense Refill  . albuterol (PROVENTIL HFA;VENTOLIN HFA) 108 (90 BASE) MCG/ACT inhaler Inhale 1 puff into the lungs every 4 (four) hours as needed for wheezing or shortness of breath.    . ALPRAZolam (XANAX) 0.5 MG tablet Take 0.5 mg by mouth 3 (three) times daily as needed for anxiety.    Marland Kitchen amLODipine (NORVASC) 10 MG tablet Take 10 mg by mouth daily.    Marland Kitchen atenolol (TENORMIN) 100 MG tablet Take 100 mg by mouth daily.    . Calcium Carbonate-Vitamin D (CALCIUM 600+D) 600-200 MG-UNIT TABS Take 1 tablet by mouth daily.    . Cholecalciferol (VITAMIN D-3 PO) Take by mouth daily.    . ergocalciferol (VITAMIN D2) 50000  UNITS capsule Take 50,000 Units by mouth once a week.    . meclizine (ANTIVERT) 25 MG tablet Take 1 tablet (25 mg total) by mouth 3 (three) times daily as needed for dizziness. 30 tablet 0  . sertraline (ZOLOFT) 100 MG tablet Take 100 mg by mouth daily.    . valsartan-hydrochlorothiazide (DIOVAN-HCT) 320-25 MG per tablet Take 1 tablet by mouth daily.     No current facility-administered medications on file prior to visit.    BP 112/70 mmHg  Pulse 88  Temp(Src) 98.8 F (37.1 C) (Oral)  Ht 5\' 7"  (1.702 m)  Wt 216 lb (97.977 kg)  BMI 33.82 kg/m2  SpO2 98%       Objective:   Physical Exam  General  Mental Status - Alert. General Appearance - Well groomed. Not in acute distress.  Skin Rashes- No Rashes.  HEENT Head- Normal. Ear Auditory Canal - Left- Normal. Right -  Normal.Tympanic Membrane- Left- Normal. Right- Normal. Eye Sclera/Conjunctiva- Left- Normal. Right- Normal. Nose & Sinuses Nasal Mucosa- Left-  Mild boggy + Congested. Right-  Mild   boggy + Congested. Mouth & Throat Lips: Upper Lip- Normal: no dryness, cracking, pallor, cyanosis, or vesicular eruption. Lower Lip-Normal: no dryness, cracking, pallor, cyanosis or vesicular eruption. Buccal Mucosa- Bilateral- No Aphthous ulcers. Oropharynx- No Discharge or Erythema. +pnd Tonsils: Characteristics- Bilateral- No Erythema or Congestion. Size/Enlargement- Bilateral- No enlargement. Discharge- bilateral-None.  Neck Neck- Supple. No Masses.   Chest and Lung Exam Auscultation: Breath Sounds:- even and unlabored  Cardiovascular Auscultation:Rythm- Regular, rate and rhythm. Murmurs & Other Heart Sounds:Ausculatation of the heart reveal- No Murmurs.  Lymphatic Head & Neck General Head & Neck Lymphatics: Bilateral: Description- No Localized lymphadenopathy.       Assessment & Plan:  Rest hydrate tylenol for fever,  Flonase nasal spray.  Benzonatate for cough.  Azithromycin antibiotic(if symptoms persist or worsen by wed or Thursday)  Follow up 7 days or as needed

## 2014-11-13 NOTE — Patient Instructions (Addendum)
Rest hydrate tylenol for fever,  Flonase nasal spray.  Benzonatate for cough.  Azithromycin antibiotic(if symptoms persist or worsen by wed or Thursday)  Follow up 7 days or as needed  Not flu test and rapid strep both were negative.

## 2014-11-16 ENCOUNTER — Ambulatory Visit: Payer: Managed Care, Other (non HMO) | Admitting: Physical Therapy

## 2014-11-21 ENCOUNTER — Other Ambulatory Visit: Payer: Self-pay | Admitting: Medical

## 2014-11-21 NOTE — Telephone Encounter (Signed)
Called patient to see why she needs refill on Zithromax. Left message for call back.

## 2014-11-21 NOTE — Telephone Encounter (Signed)
Returning your call   Received: Today    Kirwin, CMA    Phone Number: [phone]    Caller name: Aryana  Relationship to patient: Self  Can be reached: [phone] Reason for call: Pt is returning your call      Patient scheduled for Wed, 11.16.16 to discuss, provider aware; Rx Denied/SLS

## 2014-11-22 ENCOUNTER — Ambulatory Visit (INDEPENDENT_AMBULATORY_CARE_PROVIDER_SITE_OTHER): Payer: Managed Care, Other (non HMO) | Admitting: Physician Assistant

## 2014-11-22 ENCOUNTER — Encounter: Payer: Self-pay | Admitting: Physician Assistant

## 2014-11-22 VITALS — BP 144/69 | HR 56 | Temp 98.1°F | Resp 16 | Ht 67.0 in | Wt 216.2 lb

## 2014-11-22 DIAGNOSIS — M791 Myalgia, unspecified site: Secondary | ICD-10-CM | POA: Insufficient documentation

## 2014-11-22 DIAGNOSIS — M353 Polymyalgia rheumatica: Secondary | ICD-10-CM | POA: Diagnosis not present

## 2014-11-22 MED ORDER — PREDNISONE 5 MG PO TABS
15.0000 mg | ORAL_TABLET | Freq: Every day | ORAL | Status: DC
Start: 2014-11-22 — End: 2014-12-05

## 2014-11-22 NOTE — Progress Notes (Signed)
Pre visit review using our clinic review tool, if applicable. No additional management support is needed unless otherwise documented below in the visit note/SLS  

## 2014-11-22 NOTE — Patient Instructions (Signed)
Please start the steroid as directed daily over the next week. Call me and let me know how you symptoms are doing. You will be contacted by Rheumatology (Ang) for assessment.  Please return to lab one morning around 8Am for a cortisol level check. The ladies at the front desk can help schedule this.

## 2014-11-22 NOTE — Addendum Note (Signed)
Addended by: Raiford Noble on: 11/22/2014 12:45 PM   Modules accepted: Orders

## 2014-11-22 NOTE — Progress Notes (Signed)
Patient presents to clinic today for management of chronic myalgia symptoms. Patient previously evaluated here in office. ESR elevated and patient referred to Rheumatology. Endorses Rheumatology workup negative but feels the specialist is not listening to her complaints of helping give suggestions for diagnosis or management. Patient with hip and shoulder stiffness and pain. Also with chronic lower back pain for which she sees Orthopedics. 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 Patient does note every time she has a macrolide for infection, she feels much better.  Past Medical History  Diagnosis Date  . Whiplash injury 06/07/2010  . Bronchitis   . Hypertension   . Anxiety   . Vitamin D deficiency   . Degenerative disorder of bone   . Herniated disc, cervical   . Arthritis     neck and back  . Pinched nerve in neck   . Sleep apnea   . GERD (gastroesophageal reflux disease)   . Osteopenia   . Depression   . Obstructive sleep apnea     Current Outpatient Prescriptions on File Prior to Visit  Medication Sig Dispense Refill  . albuterol (PROVENTIL HFA;VENTOLIN HFA) 108 (90 BASE) MCG/ACT inhaler Inhale 1 puff into the lungs every 4 (four) hours as needed for wheezing or shortness of breath.    . ALPRAZolam (XANAX) 0.5 MG tablet Take 0.5 mg by mouth 3 (three) times daily as needed for anxiety.    Marland Kitchen amLODipine (NORVASC) 10 MG tablet Take 10 mg by mouth daily.    Marland Kitchen atenolol (TENORMIN) 100 MG tablet Take 100 mg by mouth daily.    Marland Kitchen azithromycin (ZITHROMAX) 250 MG tablet Take 2 tablets by mouth on day 1, followed by 1 tablet by mouth daily for 4 days. 6 tablet 0  . benzonatate (TESSALON) 100 MG capsule Take 1 capsule (100 mg total) by mouth 3 (three) times daily as needed. 21 capsule 0  . Calcium Carbonate-Vitamin D (CALCIUM 600+D) 600-200 MG-UNIT TABS Take 1 tablet by mouth daily.    . Cholecalciferol (VITAMIN D-3 PO) Take by mouth  daily.    . ergocalciferol (VITAMIN D2) 50000 UNITS capsule Take 50,000 Units by mouth once a week.    . fluticasone (FLONASE) 50 MCG/ACT nasal spray Place 2 sprays into both nostrils daily. 16 g 1  . meclizine (ANTIVERT) 25 MG tablet Take 1 tablet (25 mg total) by mouth 3 (three) times daily as needed for dizziness. 30 tablet 0  . sertraline (ZOLOFT) 100 MG tablet Take 100 mg by mouth daily.    . valsartan-hydrochlorothiazide (DIOVAN-HCT) 320-25 MG per tablet Take 1 tablet by mouth daily.     No current facility-administered medications on file prior to visit.    Allergies  Allergen Reactions  . Ace Inhibitors Cough  . Sulfur Nausea And Vomiting  . Voltaren [Diclofenac Sodium] Other (See Comments)    Hypersensitivity    Family History  Problem Relation Age of Onset  . Cancer Mother   . Stroke Father   . Hypertension Father   . Diabetes Neg Hx     Social History   Social History  . Marital Status: Married    Spouse Name: N/A  . Number of Children: N/A  . Years of Education: N/A   Social History Main Topics  . Smoking status: Former Smoker    Quit date: 09/06/2013  . Smokeless tobacco: None     Comment: Pt started to use vaporizer-uses rarely  . Alcohol  Use: 0.0 oz/week    0 Standard drinks or equivalent per week     Comment: Pt drinks wine 1-2 per month  . Drug Use: No  . Sexual Activity: Not Asked   Other Topics Concern  . None   Social History Narrative    Review of Systems - See HPI.  All other ROS are negative.  BP 144/69 mmHg  Pulse 56  Temp(Src) 98.1 F (36.7 C) (Oral)  Resp 16  Ht '5\' 7"'  (1.702 m)  Wt 216 lb 4 oz (98.09 kg)  BMI 33.86 kg/m2  SpO2 97%  Physical Exam  Constitutional: She is oriented to person, place, and time and well-developed, well-nourished, and in no distress.  HENT:  Head: Normocephalic and atraumatic.  Cardiovascular: Normal rate, regular rhythm, normal heart sounds and intact distal pulses.   Pulmonary/Chest: Effort  normal and breath sounds normal. No respiratory distress. She has no wheezes. She has no rales. She exhibits no tenderness.  Neurological: She is oriented to person, place, and time.  Skin: Skin is warm and dry. No rash noted.  Vitals reviewed.   Recent Results (from the past 2160 hour(s))  CBC     Status: Abnormal   Collection Time: 10/27/14  4:08 PM  Result Value Ref Range   WBC 6.6 4.0 - 10.5 K/uL   RBC 4.12 3.87 - 5.11 MIL/uL   Hemoglobin 11.9 (L) 12.0 - 15.0 g/dL   HCT 36.2 36.0 - 46.0 %   MCV 87.9 78.0 - 100.0 fL   MCH 28.9 26.0 - 34.0 pg   MCHC 32.9 30.0 - 36.0 g/dL   RDW 14.1 11.5 - 15.5 %   Platelets 331 150 - 400 K/uL   MPV 9.3 8.6 - 12.4 fL  TSH     Status: None   Collection Time: 10/27/14  4:08 PM  Result Value Ref Range   TSH 1.507 0.350 - 4.500 uIU/mL  T4, free     Status: None   Collection Time: 10/27/14  4:08 PM  Result Value Ref Range   Free T4 1.04 0.80 - 1.80 ng/dL  B12     Status: None   Collection Time: 10/27/14  4:08 PM  Result Value Ref Range   Vitamin B-12 430 211 - 911 pg/mL  Vitamin D (25 hydroxy)     Status: None   Collection Time: 10/27/14  4:08 PM  Result Value Ref Range   Vit D, 25-Hydroxy 49 30 - 100 ng/mL    Comment: Vitamin D Status           25-OH Vitamin D        Deficiency                <20 ng/mL        Insufficiency         20 - 29 ng/mL        Optimal             > or = 30 ng/mL   For 25-OH Vitamin D testing on patients on D2-supplementation and patients for whom quantitation of D2 and D3 fractions is required, the QuestAssureD 25-OH VIT D, (D2,D3), LC/MS/MS is recommended: order code [id number] (patients > 2 yrs).   POCT rapid strep A     Status: None   Collection Time: 11/13/14  4:40 PM  Result Value Ref Range   Rapid Strep A Screen Negative Negative  POCT Influenza A/B     Status: None   Collection Time:  11/13/14  4:40 PM  Result Value Ref Range   Influenza A, POC Negative Negative   Influenza B, POC  Negative     Assessment/Plan: Polymyalgia ([place]) Referral to [facility] for Rheumatology placed. 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 Symptoms improved with Azithromycin (macrolide) which has anti-inflammatory properties. Literature reviewed and it seems there are multiple studies in [place] regarding use of macrolides for chronic inflammatory conditions. Discussed with patient that it is not standard of care at present but she can discuss with new specialist. Will begin trial of low-dose (15 mg) prednisone daily to assess for improvement which should be seen in a PMR setting.

## 2014-11-22 NOTE — Assessment & Plan Note (Signed)
Referral to [facility] for Rheumatology placed. 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 Symptoms improved with Azithromycin (macrolide) which has anti-inflammatory properties. Literature reviewed and it seems there are multiple studies in [place] regarding use of macrolides for chronic inflammatory conditions. Discussed with patient that it is not standard of care at present but she can discuss with new specialist. Will begin trial of low-dose (15 mg) prednisone daily to assess for improvement which should be seen in a PMR setting.

## 2014-11-23 ENCOUNTER — Other Ambulatory Visit (INDEPENDENT_AMBULATORY_CARE_PROVIDER_SITE_OTHER): Payer: Managed Care, Other (non HMO)

## 2014-11-23 DIAGNOSIS — M353 Polymyalgia rheumatica: Secondary | ICD-10-CM

## 2014-11-23 LAB — CORTISOL: CORTISOL PLASMA: 22.1 ug/dL

## 2014-11-28 ENCOUNTER — Ambulatory Visit: Payer: Managed Care, Other (non HMO) | Admitting: Physical Therapy

## 2014-11-28 ENCOUNTER — Encounter: Payer: Self-pay | Admitting: Physical Therapy

## 2014-11-28 ENCOUNTER — Encounter: Payer: Self-pay | Admitting: Physician Assistant

## 2014-11-28 DIAGNOSIS — M25551 Pain in right hip: Secondary | ICD-10-CM

## 2014-11-28 DIAGNOSIS — M25552 Pain in left hip: Secondary | ICD-10-CM

## 2014-11-28 DIAGNOSIS — M545 Low back pain, unspecified: Secondary | ICD-10-CM

## 2014-11-28 NOTE — Therapy (Signed)
[address] [place], [place], [address] Phone: [phone]   Fax:  [phone]  Physical Therapy Treatment  Patient Details  Name: Sharlotte Flam MRN: [id number] Date of Birth: Sep 21, 1950 No Data Recorded  Encounter Date: 11/28/2014      PT End of Session - 11/28/14 1615    Visit Number 6   Date for PT Re-Evaluation 12/17/14   PT Start Time 1530   PT Stop Time 1616   PT Time Calculation (min) 46 min      Past Medical History  Diagnosis Date  . Whiplash injury 06/07/2010  . Bronchitis   . Hypertension   . Anxiety   . Vitamin D deficiency   . Degenerative disorder of bone   . Herniated disc, cervical   . Arthritis     neck and back  . Pinched nerve in neck   . Sleep apnea   . GERD (gastroesophageal reflux disease)   . Osteopenia   . Depression   . Obstructive sleep apnea     Past Surgical History  Procedure Laterality Date  . Appendectomy    . Arm surgery Left     There were no vitals filed for this visit.  Visit Diagnosis:  Hip pain, right  Midline low back pain without sciatica  Hip pain, left      Subjective Assessment - 11/28/14 1533    Subjective Pt reports she is feeling much better. She had to take a few weeks off secondary to a sinus infection. Reports that the antibiotics and steroids she was taking really seemed to reduce the pain in her hip to a tolerable rate. She has been able to do more the last few days than she has in a long time.  Reports she is investigating a research study being done on a bacteria that comes from a herniated disk that her doctors are looking into.    Currently in Pain? Yes   Pain Score 4    Pain Location Back   Pain Orientation Lower;Left;Right   Pain Descriptors / Indicators Dull;Aching                         [facility] Adult PT Treatment/Exercise - 11/28/14 0001    Lumbar Exercises: Machines for Strengthening   Cybex Knee  Extension 5 # 2 sets 15  10# too heavy   Cybex Knee Flexion 20# 2 sets 10   Leg Press 30# 2 sets 10   Other Lumbar Machine Exercise ab pull blue tband 2 sets 10   Other Lumbar Machine Exercise Ab press 25# both sides 2x 10   Knee/Hip Exercises: Stretches   Passive Hamstring Stretch 2 reps;30 seconds   ITB Stretch 2 reps;30 seconds   Gastroc Stretch 2 reps;30 seconds;Both   Knee/Hip Exercises: Machines for Strengthening   Hip Cybex 5# extension/abduction both 2x 10   Manual Therapy   Manual Therapy Passive ROM                  PT Short Term Goals - 11/09/14 1501    PT SHORT TERM GOAL #1   Title independent with initial HEP   Status Achieved           PT Long Term Goals - 11/09/14 1501    PT LONG TERM GOAL #1   Title understand proper posture and body mechanics   Status On-going   PT LONG TERM GOAL #2  Title decrease pain 50%   Status On-going   PT LONG TERM GOAL #3   Title increase lumbar ROM to WNL's   Baseline WFLS except fflexion decreased 25%   Status On-going   PT LONG TERM GOAL #4   Title sit with 50% less pain   Status On-going               Plan - 11/28/14 1616    Clinical Impression Statement Pt able to tolerate all exercises well, reported decreased pain and declined modalities. Self reports weakness and tightness. Pt required less VC's with exercises, and was very modivated for therapy due to decreased pain.    PT Next Visit Plan Continue to add lumbar resistance exercises and stretches.         Problem List Patient Active Problem List   Diagnosis Date Noted  . Polymyalgia ([place]) 11/22/2014  . Other fatigue 10/28/2014  . Benign paroxysmal positional vertigo 08/21/2014  . Essential hypertension 08/21/2014  . Anxiety and depression 08/21/2014  . Hip stiffness 08/21/2014    Crist Fat, SPTA 11/28/2014, 4:19 PM  [place] [place], [place],  [address] Phone: [phone]   Fax:  [phone]  Name: Rosalin Causer MRN: [id number] Date of Birth: 30-Jan-1950

## 2014-12-05 ENCOUNTER — Ambulatory Visit: Payer: Managed Care, Other (non HMO) | Admitting: Physical Therapy

## 2014-12-05 ENCOUNTER — Encounter: Payer: Self-pay | Admitting: Physical Therapy

## 2014-12-05 ENCOUNTER — Other Ambulatory Visit: Payer: Self-pay | Admitting: Physician Assistant

## 2014-12-05 DIAGNOSIS — M545 Low back pain, unspecified: Secondary | ICD-10-CM

## 2014-12-05 DIAGNOSIS — M25552 Pain in left hip: Secondary | ICD-10-CM

## 2014-12-05 DIAGNOSIS — M353 Polymyalgia rheumatica: Secondary | ICD-10-CM

## 2014-12-05 DIAGNOSIS — M25551 Pain in right hip: Secondary | ICD-10-CM | POA: Diagnosis not present

## 2014-12-05 MED ORDER — PREDNISONE 5 MG PO TABS: 15.0000 mg | ORAL_TABLET | Freq: Every day | ORAL | Status: DC

## 2014-12-05 NOTE — Therapy (Signed)
[address] [place], [place], [address] Phone: [phone]   Fax:  [phone]  Physical Therapy Treatment  Patient Details  Name: Shannon [name] MRN: [id number] Date of Birth: Feb 11, 1950 No Data Recorded  Encounter Date: 12/05/2014      PT End of Session - 12/05/14 1239    Visit Number 7   Date for PT Re-Evaluation 12/17/14   PT Start Time [id number]   PT Stop Time 1240   PT Time Calculation (min) 55 min      Past Medical History  Diagnosis Date  . Whiplash injury 06/07/2010  . Bronchitis   . Hypertension   . Anxiety   . Vitamin D deficiency   . Degenerative disorder of bone   . Herniated disc, cervical   . Arthritis     neck and back  . Pinched nerve in neck   . Sleep apnea   . GERD (gastroesophageal reflux disease)   . Osteopenia   . Depression   . Obstructive sleep apnea     Past Surgical History  Procedure Laterality Date  . Appendectomy    . Arm surgery Left     There were no vitals filed for this visit.  Visit Diagnosis:  Hip pain, right  Midline low back pain without sciatica  Hip pain, left      Subjective Assessment - 12/05/14 1155    Subjective Pt reports she is in a little more pain this time than the previous visit, she thinks it is due to the change in the weather and the rain. Still reporting improvement from previous sessions but declines some since last visit.    Currently in Pain? Yes   Pain Score 4    Pain Location Back   Pain Orientation Lower;Right;Left   Pain Descriptors / Indicators Aching;Dull   Pain Type Chronic pain   Pain Onset More than a month ago   Pain Frequency Constant                         [facility] Adult PT Treatment/Exercise - 12/05/14 0001    Lumbar Exercises: Machines for Strengthening   Cybex Knee Extension 5 # 2 sets 15  10# too heavy   Cybex Knee Flexion 20# 2 sets 10   Leg Press 30# 2 sets 10   Lumbar Exercises: Seated    Sit to Stand 10 reps  2 sets    Sit to Stand Limitations with weighted yellow ball press    Lumbar Exercises: Supine   Other Supine Lumbar Exercises seated dyna disk lumbar CW, CCW, Flex/ex    Other Supine Lumbar Exercises seated oblique on dyna disk with blue weighted ball 2x10    Knee/Hip Exercises: Stretches   Passive Hamstring Stretch 2 reps;30 seconds   ITB Stretch 2 reps;30 seconds   Piriformis Stretch 2 reps;30 seconds;Both   Gastroc Stretch 2 reps;30 seconds;Both   Knee/Hip Exercises: Aerobic   Nustep L5 x 31min   Knee/Hip Exercises: Standing   Hip Abduction Stengthening;Left;15 reps;Knee straight;AROM;Right;2 sets;10 reps  5#   Hip Extension Stengthening;Right;Left;15 reps;Knee straight;AROM;2 sets;10 reps  5#   Lateral Step Up Both;2 sets;10 reps  6" up and over    Knee/Hip Exercises: Supine   Bridges with Diona Foley Squeeze Strengthening;Both;2 sets;10 reps  5 sec hold                   PT Short Term Goals -  11/09/14 1501    PT SHORT TERM GOAL #1   Title independent with initial HEP   Status Achieved           PT Long Term Goals - 11/09/14 1501    PT LONG TERM GOAL #1   Title understand proper posture and body mechanics   Status On-going   PT LONG TERM GOAL #2   Title decrease pain 50%   Status On-going   PT LONG TERM GOAL #3   Title increase lumbar ROM to WNL's   Baseline WFLS except fflexion decreased 25%   Status On-going   PT LONG TERM GOAL #4   Title sit with 50% less pain   Status On-going               Plan - 12/05/14 1240    Clinical Impression Statement Pt reported relief with lumbar stabilzation exercises. Pt presented with increased tightness in the lower extrimities.  Pt declined modalities, reported relief with stretching.    PT Next Visit Plan Work on core , lumbar strenghtening, and balance        Problem List Patient Active Problem List   Diagnosis Date Noted  . Polymyalgia ([place]) 11/22/2014  . Other fatigue  10/28/2014  . Benign paroxysmal positional vertigo 08/21/2014  . Essential hypertension 08/21/2014  . Anxiety and depression 08/21/2014  . Hip stiffness 08/21/2014    Crist Fat, SPTA 12/05/2014, 12:46 PM  [address] [place], [place], [address] Phone: [phone]   Fax:  [phone]  Name: Shannon [name] MRN: [id number] Date of Birth: May 10, 1950

## 2014-12-07 ENCOUNTER — Ambulatory Visit: Payer: Managed Care, Other (non HMO) | Attending: Orthopedic Surgery | Admitting: Physical Therapy

## 2014-12-07 ENCOUNTER — Encounter: Payer: Self-pay | Admitting: Physical Therapy

## 2014-12-07 DIAGNOSIS — M545 Low back pain, unspecified: Secondary | ICD-10-CM

## 2014-12-07 DIAGNOSIS — M25552 Pain in left hip: Secondary | ICD-10-CM

## 2014-12-07 DIAGNOSIS — M25551 Pain in right hip: Secondary | ICD-10-CM | POA: Diagnosis not present

## 2014-12-07 NOTE — Therapy (Signed)
[place] [address] [place], [place], [address] Phone: [phone]   Fax:  [phone]  Physical Therapy Treatment  Patient Details  Name: Shannon [name] MRN: [id number] Date of Birth: 08-26-1950 No Data Recorded  Encounter Date: 12/07/2014      PT End of Session - 12/07/14 1519    Visit Number 8   Date for PT Re-Evaluation 12/17/14   PT Start Time 1445   PT Stop Time 1530   PT Time Calculation (min) 45 min      Past Medical History  Diagnosis Date  . Whiplash injury 06/07/2010  . Bronchitis   . Hypertension   . Anxiety   . Vitamin D deficiency   . Degenerative disorder of bone   . Herniated disc, cervical   . Arthritis     neck and back  . Pinched nerve in neck   . Sleep apnea   . GERD (gastroesophageal reflux disease)   . Osteopenia   . Depression   . Obstructive sleep apnea     Past Surgical History  Procedure Laterality Date  . Appendectomy    . Arm surgery Left     There were no vitals filed for this visit.  Visit Diagnosis:  Hip pain, right  Midline low back pain without sciatica  Hip pain, left      Subjective Assessment - 12/07/14 1450    Subjective Pt reports  she is feeling better today. Complained of soreness in the hips but thinks it may be from exercise.    Currently in Pain? Yes   Pain Score 3    Pain Location Back   Pain Orientation Right;Left;Lower   Pain Descriptors / Indicators Aching;Sore   Pain Type Chronic pain                         [facility] Adult PT Treatment/Exercise - 12/07/14 0001    Lumbar Exercises: Machines for Strengthening   Cybex Knee Extension 5 # 2 sets 15  10# too heavy   Cybex Knee Flexion 20# 2 sets 10   Lumbar Exercises: Supine   Bridge 10 reps  2 sets 10 sec hold with ball squeeze    Other Supine Lumbar Exercises seated dyna disk lumbar CW, CCW, Flex/ex    Other Supine Lumbar Exercises seated oblique on dyna disk with blue  weighted ball 2x10    Knee/Hip Exercises: Standing   Hip Abduction Stengthening;Left;15 reps;Knee straight;AROM;Right;2 sets;10 reps  5#   Hip Extension Stengthening;Right;Left;15 reps;Knee straight;AROM;2 sets;10 reps  5#   Lateral Step Up Both;2 sets;15 reps  6" up and over    Manual Therapy   Manual Therapy Passive ROM                  PT Short Term Goals - 11/09/14 1501    PT SHORT TERM GOAL #1   Title independent with initial HEP   Status Achieved           PT Long Term Goals - 12/07/14 1502    PT LONG TERM GOAL #1   Title understand proper posture and body mechanics   Baseline Pt reports she has been working on    Status On-going   PT LONG TERM GOAL #2   Baseline pt reported decrease pain 75%   PT LONG TERM GOAL #3   Title increase lumbar ROM to WNL's   Status On-going   PT LONG TERM  GOAL #4   Baseline sitting is 40% easier    Status On-going               Plan - 12/07/14 1520    Clinical Impression Statement Pt declined any modlities due to decreased c/o  pain. Pt reports she feels as if her quads and L knee are getting stronger.    PT Next Visit Plan Work on core , lumbar strenghtening, and balance        Problem List Patient Active Problem List   Diagnosis Date Noted  . Polymyalgia ([place]) 11/22/2014  . Other fatigue 10/28/2014  . Benign paroxysmal positional vertigo 08/21/2014  . Essential hypertension 08/21/2014  . Anxiety and depression 08/21/2014  . Hip stiffness 08/21/2014    Crist Fat, SPTA 12/07/2014, 3:30 PM  [address] [place], [place], [address] Phone: [phone]   Fax:  [phone]  Name: Emma-Louise Kihn MRN: [id number] Date of Birth: April 17, 1950

## 2014-12-13 ENCOUNTER — Encounter: Payer: Self-pay | Admitting: Physical Therapy

## 2014-12-13 ENCOUNTER — Ambulatory Visit: Payer: Managed Care, Other (non HMO) | Admitting: Physical Therapy

## 2014-12-13 DIAGNOSIS — M25551 Pain in right hip: Secondary | ICD-10-CM

## 2014-12-13 DIAGNOSIS — M25552 Pain in left hip: Secondary | ICD-10-CM

## 2014-12-13 DIAGNOSIS — M545 Low back pain, unspecified: Secondary | ICD-10-CM

## 2014-12-13 NOTE — Therapy (Signed)
[place] [address], [place], [address] Phone: [phone]   Fax:  [phone]  Physical Therapy Treatment  Patient Details  Name: Shannon [name] MRN: [id number] Date of Birth: [birth year] No Data Recorded  Encounter Date: 12/13/2014      PT End of Session - 12/13/14 1426    Visit Number 9   Date for PT Re-Evaluation 12/17/14   PT Start Time 1345   PT Stop Time 1427   PT Time Calculation (min) 42 min   Activity Tolerance Patient tolerated treatment well   Behavior During Therapy [facility] for tasks assessed/performed      Past Medical History  Diagnosis Date  . Whiplash injury 06/07/2010  . Bronchitis   . Hypertension   . Anxiety   . Vitamin D deficiency   . Degenerative disorder of bone   . Herniated disc, cervical   . Arthritis     neck and back  . Pinched nerve in neck   . Sleep apnea   . GERD (gastroesophageal reflux disease)   . Osteopenia   . Depression   . Obstructive sleep apnea     Past Surgical History  Procedure Laterality Date  . Appendectomy    . Arm surgery Left     There were no vitals filed for this visit.  Visit Diagnosis:  Hip pain, left  Midline low back pain without sciatica  Hip pain, right      Subjective Assessment - 12/13/14 1349    Pain Descriptors / Indicators Tightness;Aching                         [facility] Adult PT Treatment/Exercise - 12/13/14 0001    Lumbar Exercises: Stretches   Passive Hamstring Stretch 3 reps;10 seconds   Single Knee to Chest Stretch 10 seconds;5 reps   Piriformis Stretch 10 seconds;5 reps   Lumbar Exercises: Machines for Strengthening   Cybex Knee Extension 5 # 2 sets 15   Cybex Knee Flexion 20# 2 sets 15   Lumbar Exercises: Seated   Sit to Stand 10 reps  2 sets holding blue weighted ball    Lumbar Exercises: Supine   Bridge 15 reps;2 seconds   Other Supine Lumbar Exercises seated dyna disk lumbar CW, CCW,  Flex/ex    Other Supine Lumbar Exercises seated oblique on dyna disk with blue weighted ball 2x10; Bilat shoulder flex with blue weighted ball 2x10     Knee/Hip Exercises: Aerobic   Nustep L4 x 69min   Knee/Hip Exercises: Standing   Hip Abduction Stengthening;Left;15 reps;Knee straight;AROM;Right;2 sets;10 reps   Abduction Limitations #5   Hip Extension Stengthening;Right;Left;15 reps;Knee straight;AROM;2 sets;10 reps   Extension Limitations #5   Lateral Step Up 15 reps;1 set;Both;Step Height: 6"  #3                   PT Short Term Goals - 11/09/14 1501    PT SHORT TERM GOAL #1   Title independent with initial HEP   Status Achieved           PT Long Term Goals - 12/13/14 1424    PT LONG TERM GOAL #2   Title decrease pain 50%   Status Achieved   PT LONG TERM GOAL #4   Title sit with 50% less pain   Status On-going               Plan - 12/13/14 1427  Clinical Impression Statement Pt reports that she fells better overall but overdid it yesterday. Completed all interventions well. Report relief with piriformis stretching.   Rehab Potential Good   PT Frequency 2x / week   PT Duration 8 weeks   PT Next Visit Plan Work on core , lumbar strenghtening, and balance        Problem List Patient Active Problem List   Diagnosis Date Noted  . Polymyalgia ([place]) 11/22/2014  . Other fatigue 10/28/2014  . Benign paroxysmal positional vertigo 08/21/2014  . Essential hypertension 08/21/2014  . Anxiety and depression 08/21/2014  . Hip stiffness 08/21/2014    Scot Jun, PTA  12/13/2014, 2:34 PM  [address] [place], [place], [address] Phone: [phone]   Fax:  [phone]  Name: Shannon [name] MRN: [id number] Date of Birth: 1950-09-29

## 2014-12-19 ENCOUNTER — Ambulatory Visit: Payer: Managed Care, Other (non HMO) | Admitting: Physical Therapy

## 2014-12-19 ENCOUNTER — Encounter: Payer: Self-pay | Admitting: Physical Therapy

## 2014-12-19 DIAGNOSIS — M25551 Pain in right hip: Secondary | ICD-10-CM | POA: Diagnosis not present

## 2014-12-19 DIAGNOSIS — M545 Low back pain, unspecified: Secondary | ICD-10-CM

## 2014-12-19 DIAGNOSIS — M25552 Pain in left hip: Secondary | ICD-10-CM

## 2014-12-19 NOTE — Therapy (Signed)
[address] [place], [place], [address] Phone: [phone]   Fax:  [phone]  Physical Therapy Treatment  Patient Details  Name: Shannon [name] MRN: [id number] Date of Birth: 01-24-1950 No Data Recorded  Encounter Date: 12/19/2014      PT End of Session - 12/19/14 1516    Visit Number 10   Date for PT Re-Evaluation 12/17/14   PT Start Time 1430   PT Stop Time 1516   PT Time Calculation (min) 46 min   Activity Tolerance Patient tolerated treatment well   Behavior During Therapy [facility] for tasks assessed/performed      Past Medical History  Diagnosis Date  . Whiplash injury 06/07/2010  . Bronchitis   . Hypertension   . Anxiety   . Vitamin D deficiency   . Degenerative disorder of bone   . Herniated disc, cervical   . Arthritis     neck and back  . Pinched nerve in neck   . Sleep apnea   . GERD (gastroesophageal reflux disease)   . Osteopenia   . Depression   . Obstructive sleep apnea     Past Surgical History  Procedure Laterality Date  . Appendectomy    . Arm surgery Left     There were no vitals filed for this visit.  Visit Diagnosis:  Hip pain, right  Midline low back pain without sciatica  Hip pain, left      Subjective Assessment - 12/19/14 1432    Subjective Pt reports that things are going well, having difficulty standing from her couch. Was tired after last treatment but wasn't too sore.   Currently in Pain? Yes   Pain Score 4    Pain Location Back            [facility] PT Assessment - 12/19/14 0001    AROM   Overall AROM Comments Lumbar ROM WFL, hip ROM WFL's   Strength   Overall Strength Comments 4/5 for the hips                      [facility] Adult PT Treatment/Exercise - 12/19/14 0001    Lumbar Exercises: Stretches   Passive Hamstring Stretch 3 reps;10 seconds   Single Knee to Chest Stretch 2 reps;10 seconds   Piriformis Stretch 10 seconds;3 reps   Lumbar Exercises: Aerobic   Stationary Bike L0 x3    Elliptical Nustep L 5 7 min   Lumbar Exercises: Machines for Strengthening   Cybex Knee Extension 5 # 2 sets 15   Cybex Knee Flexion 20# 2 sets 15   Leg Press 20# 2 sets 15   Lumbar Exercises: Standing   Row 15 reps;Theraband  2 sets   Theraband Level (Row) Level 3 (Green)   Lumbar Exercises: Seated   Sit to Stand 10 reps  2 sets holding blue weighted ball   Lumbar Exercises: Supine   Other Supine Lumbar Exercises seated dyna disk lumbar CW, CCW, Flex/ex 2x10   Other Supine Lumbar Exercises seated oblique on dyna disk with blue weighted ball 2x10; Bilat shoulder flex with blue weighted ball 2x10     Knee/Hip Exercises: Standing   Hip Abduction Stengthening;Left;15 reps;Knee straight;AROM;Right;2 sets;10 reps   Abduction Limitations #5   Hip Extension Stengthening;Right;Left;15 reps;Knee straight;AROM;2 sets;10 reps   Extension Limitations #5                  PT Short Term Goals -  11/09/14 1501    PT SHORT TERM GOAL #1   Title independent with initial HEP   Status Achieved           PT Long Term Goals - 12/19/14 1516    PT LONG TERM GOAL #3   Title increase lumbar ROM to WNL's   Status Partially Met   PT LONG TERM GOAL #4   Title sit with 50% less pain   Status Partially Met               Plan - 12/19/14 1516    Clinical Impression Statement Continues to reports relief with piriformis stretch. Pt completed all interventions this date without issues. Pt reports that she is doing well. Pt lumbar ROM has improved and she has partially met some goals.    Rehab Potential Good   PT Frequency 2x / week   PT Duration 8 weeks   PT Next Visit Plan Work on core , lumbar strenghtening, and balance        Problem List Patient Active Problem List   Diagnosis Date Noted  . Polymyalgia ([place]) 11/22/2014  . Other fatigue 10/28/2014  . Benign paroxysmal positional vertigo 08/21/2014  . Essential  hypertension 08/21/2014  . Anxiety and depression 08/21/2014  . Hip stiffness 08/21/2014    Scot Jun, PTA  12/19/2014, 3:19 PM  [address] [place], [place], [address] Phone: [phone]   Fax:  [phone]  Name: Shannon [name] MRN: [id number] Date of Birth: 10-25-1950

## 2014-12-25 ENCOUNTER — Encounter: Payer: Self-pay | Admitting: Physician Assistant

## 2014-12-26 ENCOUNTER — Encounter: Payer: Self-pay | Admitting: Physician Assistant

## 2014-12-27 ENCOUNTER — Encounter: Payer: Self-pay | Admitting: Physical Therapy

## 2014-12-27 ENCOUNTER — Ambulatory Visit: Payer: Managed Care, Other (non HMO) | Admitting: Physical Therapy

## 2014-12-27 DIAGNOSIS — M25551 Pain in right hip: Secondary | ICD-10-CM | POA: Diagnosis not present

## 2014-12-27 DIAGNOSIS — M545 Low back pain, unspecified: Secondary | ICD-10-CM

## 2014-12-27 DIAGNOSIS — M25552 Pain in left hip: Secondary | ICD-10-CM

## 2014-12-27 NOTE — Therapy (Addendum)
[address] [place], [place], [address] Phone: [phone]   Fax:  [phone]  Physical Therapy Treatment  Patient Details  Name: Shannon [name] MRN: [id number] Date of Birth: Jun 24, 1950 No Data Recorded  Encounter Date: 12/27/2014      PT End of Session - 12/27/14 1427    Visit Number 11   Date for PT Re-Evaluation 01/17/15   PT Start Time 1340   PT Stop Time 1429   PT Time Calculation (min) 49 min   Activity Tolerance Patient tolerated treatment well   Behavior During Therapy [facility] for tasks assessed/performed      Past Medical History  Diagnosis Date  . Whiplash injury 06/07/2010  . Bronchitis   . Hypertension   . Anxiety   . Vitamin D deficiency   . Degenerative disorder of bone   . Herniated disc, cervical   . Arthritis     neck and back  . Pinched nerve in neck   . Sleep apnea   . GERD (gastroesophageal reflux disease)   . Osteopenia   . Depression   . Obstructive sleep apnea     Past Surgical History  Procedure Laterality Date  . Appendectomy    . Arm surgery Left     There were no vitals filed for this visit.  Visit Diagnosis:  Hip pain, left  Midline low back pain without sciatica  Hip pain, right      Subjective Assessment - 12/27/14 1340    Subjective Things are going good, "I need more sleep"   Currently in Pain? Yes   Pain Score 3    Pain Location Back   Pain Orientation Right                         [facility] Adult PT Treatment/Exercise - 12/27/14 0001    Lumbar Exercises: Machines for Strengthening   Cybex Knee Extension 10 # 2 sets 15   Cybex Knee Flexion 25# 2 sets 15   Leg Press 30#  3 sets 10   Other Lumbar Machine Exercise Seated rows #20 2x15    Other Lumbar Machine Exercise Lat pull downs #20 2x15    Lumbar Exercises: Standing   Row 15 reps;Theraband  2 sets    Theraband Level (Row) Level 3 (Green)   Shoulder Extension 15  reps;Theraband  2 sets    Theraband Level (Shoulder Extension) Level 3 (Green)   Other Standing Lumbar Exercises Standing march #3 2x10    Knee/Hip Exercises: Aerobic   Nustep L4 x 45mn   Knee/Hip Exercises: Standing   Hip ADduction 1 set;Both;15 reps   Hip ADduction Limitations 5   Hip Abduction Stengthening;Left;Knee straight;AROM;Right;1 set;15 reps   Abduction Limitations 5   Hip Extension Stengthening;Right;Left;Knee straight;AROM;1 set;15 reps   Extension Limitations 5   Lateral Step Up Both;Step Height: 6";10 reps;2 sets  #3 ankle weight   Knee/Hip Exercises: Seated   Sit to Sand 10 reps;without UE support;2 sets  OHP with blue weighted ball                  PT Short Term Goals - 11/09/14 1501    PT SHORT TERM GOAL #1   Title independent with initial HEP   Status Achieved           PT Long Term Goals - 12/27/14 1342    PT LONG TERM GOAL #1   Title understand proper  posture and body mechanics   Status Achieved   PT LONG TERM GOAL #4   Title sit with 50% less pain   Status Achieved               Plan - 12/27/14 1428    Clinical Impression Statement Pt performed well in Pt this day, No c/o increase pain during treatment, Tolerated a more active treatment this date without any issues.   Rehab Potential Good   PT Frequency 2x / week   PT Duration 8 weeks   PT Next Visit Plan continue Work on core , lumbar strenghtening, and balance        Problem List Patient Active Problem List   Diagnosis Date Noted  . Polymyalgia ([place]) 11/22/2014  . Other fatigue 10/28/2014  . Benign paroxysmal positional vertigo 08/21/2014  . Essential hypertension 08/21/2014  . Anxiety and depression 08/21/2014  . Hip stiffness 08/21/2014    PHYSICAL THERAPY DISCHARGE SUMMARY  Visits from Start of Care: 11   Plan: Patient agrees to discharge.  Patient goals were partially met. Patient is being discharged due to being pleased with the current functional  level.  ?????       Scot Jun, PTA 12/27/2014, 2:30 PM  [address] [place], [place], [address] Phone: [phone]   Fax:  [phone]  Name: Shannon [name] MRN: [id number] Date of Birth: [birth year]

## 2015-01-03 ENCOUNTER — Ambulatory Visit: Payer: Managed Care, Other (non HMO) | Admitting: Physical Therapy

## 2015-01-03 ENCOUNTER — Encounter: Payer: Self-pay | Admitting: Physician Assistant

## 2015-01-09 ENCOUNTER — Ambulatory Visit: Payer: Managed Care, Other (non HMO) | Admitting: Physical Therapy

## 2015-01-11 ENCOUNTER — Ambulatory Visit: Payer: Managed Care, Other (non HMO) | Admitting: Physical Therapy

## 2015-01-12 ENCOUNTER — Other Ambulatory Visit: Payer: Self-pay | Admitting: Physician Assistant

## 2015-01-12 DIAGNOSIS — M353 Polymyalgia rheumatica: Secondary | ICD-10-CM

## 2015-01-12 MED ORDER — PREDNISONE 5 MG PO TABS: 10.0000 mg | ORAL_TABLET | Freq: Every day | ORAL | Status: DC

## 2015-01-24 ENCOUNTER — Other Ambulatory Visit: Payer: Self-pay | Admitting: Physician Assistant

## 2015-01-24 ENCOUNTER — Encounter: Payer: Self-pay | Admitting: Physician Assistant

## 2015-01-24 MED ORDER — TRAMADOL HCL 50 MG PO TABS: 50.0000 mg | ORAL_TABLET | Freq: Two times a day (BID) | ORAL | Status: DC | PRN

## 2015-01-29 ENCOUNTER — Ambulatory Visit (INDEPENDENT_AMBULATORY_CARE_PROVIDER_SITE_OTHER): Payer: Self-pay | Admitting: Physician Assistant

## 2015-01-29 ENCOUNTER — Encounter: Payer: Self-pay | Admitting: Physician Assistant

## 2015-01-29 VITALS — BP 112/58 | HR 60 | Temp 98.5°F | Resp 18 | Ht 67.0 in | Wt 217.0 lb

## 2015-01-29 DIAGNOSIS — M353 Polymyalgia rheumatica: Secondary | ICD-10-CM

## 2015-01-29 MED ORDER — DULOXETINE HCL 30 MG PO CPEP: 30.0000 mg | ORAL_CAPSULE | Freq: Every day | ORAL | Status: DC

## 2015-01-29 NOTE — Progress Notes (Signed)
Patient presents to clinic today for further management of polymyalgia with arthralgias of hips and pelvic girdle. Patient has had follow-up with Orthopedics who felt she had bursitis of both hips. PT was ordered which patient attended but without marked improvement in her symptoms. Prednisone has been helping with hip stiffness and pain and has completely taken care of shoulder girdle pain. Patient has still not seen Rheumatology at [facility] but has appointment scheduled in the next couple of months. Denies new or worsening symptoms.  Past Medical History  Diagnosis Date  . Whiplash injury 06/07/2010  . Bronchitis   . Hypertension   . Anxiety   . Vitamin D deficiency   . Degenerative disorder of bone   . Herniated disc, cervical   . Arthritis     neck and back  . Pinched nerve in neck   . Sleep apnea   . GERD (gastroesophageal reflux disease)   . Osteopenia   . Depression   . Obstructive sleep apnea     Current Outpatient Prescriptions on File Prior to Visit  Medication Sig Dispense Refill  . albuterol (PROVENTIL HFA;VENTOLIN HFA) 108 (90 BASE) MCG/ACT inhaler Inhale 1 puff into the lungs every 4 (four) hours as needed for wheezing or shortness of breath.    . ALPRAZolam (XANAX) 0.5 MG tablet Take 0.5 mg by mouth 3 (three) times daily as needed for anxiety.    Marland Kitchen amLODipine (NORVASC) 10 MG tablet Take 10 mg by mouth daily.    Marland Kitchen atenolol (TENORMIN) 100 MG tablet Take 100 mg by mouth daily.    . Calcium Carbonate-Vitamin D (CALCIUM 600+D) 600-200 MG-UNIT TABS Take 1 tablet by mouth daily.    . Cholecalciferol (VITAMIN D-3 PO) Take by mouth daily.    . ergocalciferol (VITAMIN D2) 50000 UNITS capsule Take 50,000 Units by mouth once a week.    . meclizine (ANTIVERT) 25 MG tablet Take 1 tablet (25 mg total) by mouth 3 (three) times daily as needed for dizziness. 30 tablet 0  . naproxen (NAPROSYN) 500 MG tablet Take 500 mg by mouth 2 (two) times daily with a meal.    . predniSONE  (DELTASONE) 5 MG tablet Take 2 tablets (10 mg total) by mouth daily with breakfast. 60 tablet 0  . sertraline (ZOLOFT) 100 MG tablet Take 100 mg by mouth daily.    . traMADol (ULTRAM) 50 MG tablet Take 1 tablet (50 mg total) by mouth every 12 (twelve) hours as needed. 60 tablet 0  . valsartan-hydrochlorothiazide (DIOVAN-HCT) 320-25 MG per tablet Take 1 tablet by mouth daily.     No current facility-administered medications on file prior to visit.    Allergies  Allergen Reactions  . Ace Inhibitors Cough  . Sulfur Nausea And Vomiting  . Voltaren [Diclofenac Sodium] Other (See Comments)    Hypersensitivity    Family History  Problem Relation Age of Onset  . Cancer Mother   . Stroke Father   . Hypertension Father   . Diabetes Neg Hx     Social History   Social History  . Marital Status: Married    Spouse Name: N/A  . Number of Children: N/A  . Years of Education: N/A   Social History Main Topics  . Smoking status: Former Smoker    Quit date: 09/06/2013  . Smokeless tobacco: None     Comment: Pt started to use vaporizer-uses rarely  . Alcohol Use: 0.0 oz/week    0 Standard drinks or equivalent per week  Comment: Pt drinks wine 1-2 per month  . Drug Use: No  . Sexual Activity: Not Asked   Other Topics Concern  . None   Social History Narrative   Review of Systems - See HPI.  All other ROS are negative.  BP 112/58 mmHg  Pulse 60  Temp(Src) 98.5 F (36.9 C) (Oral)  Resp 18  Ht 5\' 7"  (1.702 m)  Wt 217 lb (98.431 kg)  BMI 33.98 kg/m2  SpO2 98%  Physical Exam  Constitutional: She is well-developed, well-nourished, and in no distress.  HENT:  Head: Normocephalic and atraumatic.  Eyes: Conjunctivae are normal.  Cardiovascular: Normal rate, regular rhythm, normal heart sounds and intact distal pulses.   Pulmonary/Chest: Effort normal.  Musculoskeletal:       Right hip: She exhibits tenderness. She exhibits normal strength.       Left hip: She exhibits  tenderness. She exhibits normal strength.  Vitals reviewed.   Recent Results (from the past 2160 hour(s))  POCT rapid strep A     Status: None   Collection Time: 11/13/14  4:40 PM  Result Value Ref Range   Rapid Strep A Screen Negative Negative  POCT Influenza A/B     Status: None   Collection Time: 11/13/14  4:40 PM  Result Value Ref Range   Influenza A, POC Negative Negative   Influenza B, POC  Negative  Cortisol     Status: None   Collection Time: 11/23/14  7:52 AM  Result Value Ref Range   Cortisol, Plasma 22.1 ug/dL    Comment: AM:  4.3 - 22.4 ug/dLPM:  3.1 - 16.7 ug/dL    Assessment/Plan: Polymyalgia ([place]) Do still feel there is a PMR component to symptoms. Patient also with bursitis. Recommended for this she follow-up with Orthopedics for injections. Regarding PMR will keep prednisone at 10 mg per day until she sees Rheumatology. Will add on Cymbalta 30 mg daily. Follow-up 3-4 weeks.

## 2015-01-29 NOTE — Progress Notes (Signed)
Pre visit review using our clinic review tool, if applicable. No additional management support is needed unless otherwise documented below in the visit note. 

## 2015-01-29 NOTE — Assessment & Plan Note (Signed)
Do still feel there is a PMR component to symptoms. Patient also with bursitis. Recommended for this she follow-up with Orthopedics for injections. Regarding PMR will keep prednisone at 10 mg per day until she sees Rheumatology. Will add on Cymbalta 30 mg daily. Follow-up 3-4 weeks.

## 2015-01-29 NOTE — Patient Instructions (Signed)
Please continue the prednisone as directed. Begin the Cymbalta daily as directed. Tramadol on rare occasion for severe pain. ES Tylenol otherwise. Please follow-up with Orthopedics for injection for bursitis. Call Dr. Demetrio Lapping office to see if they can put you on a wait list to get you in sooner.  Follow-up in 1 month.

## 2015-02-16 ENCOUNTER — Other Ambulatory Visit: Payer: Self-pay | Admitting: Physician Assistant

## 2015-02-16 DIAGNOSIS — M353 Polymyalgia rheumatica: Secondary | ICD-10-CM

## 2015-02-18 MED ORDER — PREDNISONE 5 MG PO TABS: 10.0000 mg | ORAL_TABLET | Freq: Every day | ORAL | Status: DC

## 2015-02-20 MED ORDER — ERGOCALCIFEROL 1.25 MG (50000 UT) PO CAPS: 50000.0000 [IU] | ORAL_CAPSULE | ORAL | Status: DC

## 2015-02-20 NOTE — Addendum Note (Signed)
Addended by: Raiford Noble on: 02/20/2015 07:16 AM   Modules accepted: Orders

## 2015-03-02 ENCOUNTER — Ambulatory Visit (INDEPENDENT_AMBULATORY_CARE_PROVIDER_SITE_OTHER): Payer: 59 | Admitting: Physician Assistant

## 2015-03-02 ENCOUNTER — Encounter: Payer: Self-pay | Admitting: Physician Assistant

## 2015-03-02 VITALS — BP 138/60 | HR 62 | Temp 98.1°F | Ht 67.0 in | Wt 218.0 lb

## 2015-03-02 DIAGNOSIS — G4733 Obstructive sleep apnea (adult) (pediatric): Secondary | ICD-10-CM | POA: Diagnosis not present

## 2015-03-02 DIAGNOSIS — M353 Polymyalgia rheumatica: Secondary | ICD-10-CM | POA: Diagnosis not present

## 2015-03-02 NOTE — Progress Notes (Signed)
Pre visit review using our clinic review tool, if applicable. No additional management support is needed unless otherwise documented below in the visit note. 

## 2015-03-02 NOTE — Patient Instructions (Signed)
I am glad you are feeling better! Please continue your medications as directed. Let me know if you change your mind about the Cymbalta dose.  Follow-up in 3 months.  I will work on getting new CPAP supplies for you.

## 2015-03-04 DIAGNOSIS — G4733 Obstructive sleep apnea (adult) (pediatric): Secondary | ICD-10-CM | POA: Insufficient documentation

## 2015-03-04 NOTE — Assessment & Plan Note (Signed)
Needs new equipment due to age of current machine and supplier is out of state. Will set up order with [employer]. Patient is aware she may need new study before insurance will pay for new equipment.

## 2015-03-04 NOTE — Progress Notes (Signed)
Patient presents to clinic today for follow-up of chronic pain after the addition of Cymbalta 30 mg daily. Endorses taking medication as directed. Endorses she did not notice any improvement the first couple of weeks but now is noting a substantial difference. States the pain is still present but only mildly so and her mood has improved as well. Has upcoming appointment with Rheumatology.  Patient also requesting order for new CPAP machine and equipment. Endorses her current machine is > 17 years old. Is from company in [place]. Would like to be set up with a supplier in the area. Endorses last sleep study > 5 years ago.   Past Medical History  Diagnosis Date  . Whiplash injury 06/07/2010  . Bronchitis   . Hypertension   . Anxiety   . Vitamin D deficiency   . Degenerative disorder of bone   . Herniated disc, cervical   . Arthritis     neck and back  . Pinched nerve in neck   . Sleep apnea   . GERD (gastroesophageal reflux disease)   . Osteopenia   . Depression   . Obstructive sleep apnea     Current Outpatient Prescriptions on File Prior to Visit  Medication Sig Dispense Refill  . albuterol (PROVENTIL HFA;VENTOLIN HFA) 108 (90 BASE) MCG/ACT inhaler Inhale 1 puff into the lungs every 4 (four) hours as needed for wheezing or shortness of breath.    . ALPRAZolam (XANAX) 0.5 MG tablet Take 0.5 mg by mouth 3 (three) times daily as needed for anxiety.    Marland Kitchen amLODipine (NORVASC) 10 MG tablet Take 10 mg by mouth daily.    Marland Kitchen atenolol (TENORMIN) 100 MG tablet Take 100 mg by mouth daily.    . Calcium Carbonate-Vitamin D (CALCIUM 600+D) 600-200 MG-UNIT TABS Take 1 tablet by mouth daily.    . DULoxetine (CYMBALTA) 30 MG capsule Take 1 capsule (30 mg total) by mouth daily. 30 capsule 3  . ergocalciferol (VITAMIN D2) 50000 units capsule Take 1 capsule (50,000 Units total) by mouth once a week. 12 capsule 0  . meclizine (ANTIVERT) 25 MG tablet Take 1 tablet (25 mg total) by mouth 3 (three) times  daily as needed for dizziness. 30 tablet 0  . naproxen (NAPROSYN) 500 MG tablet Take 500 mg by mouth 2 (two) times daily with a meal.    . predniSONE (DELTASONE) 5 MG tablet Take 2 tablets (10 mg total) by mouth daily with breakfast. 60 tablet 0  . sertraline (ZOLOFT) 100 MG tablet Take 100 mg by mouth daily.    . traMADol (ULTRAM) 50 MG tablet Take 1 tablet (50 mg total) by mouth every 12 (twelve) hours as needed. 60 tablet 0  . valsartan-hydrochlorothiazide (DIOVAN-HCT) 320-25 MG per tablet Take 1 tablet by mouth daily.     No current facility-administered medications on file prior to visit.    Allergies  Allergen Reactions  . Ace Inhibitors Cough  . Sulfur Nausea And Vomiting  . Voltaren [Diclofenac Sodium] Other (See Comments)    Hypersensitivity    Family History  Problem Relation Age of Onset  . Cancer Mother   . Stroke Father   . Hypertension Father   . Diabetes Neg Hx     Social History   Social History  . Marital Status: Married    Spouse Name: N/A  . Number of Children: N/A  . Years of Education: N/A   Social History Main Topics  . Smoking status: Former Smoker  Quit date: 09/06/2013  . Smokeless tobacco: None     Comment: Pt started to use vaporizer-uses rarely  . Alcohol Use: 0.0 oz/week    0 Standard drinks or equivalent per week     Comment: Pt drinks wine 1-2 per month  . Drug Use: No  . Sexual Activity: Not Asked   Other Topics Concern  . None   Social History Narrative    Review of Systems - See HPI.  All other ROS are negative.  BP 138/60 mmHg  Pulse 62  Temp(Src) 98.1 F (36.7 C) (Oral)  Ht 5\' 7"  (1.702 m)  Wt 218 lb (98.884 kg)  BMI 34.14 kg/m2  SpO2 97%  Physical Exam  Constitutional: She is oriented to person, place, and time and well-developed, well-nourished, and in no distress.  HENT:  Head: Normocephalic and atraumatic.  Eyes: Conjunctivae are normal.  Cardiovascular: Normal rate, regular rhythm, normal heart sounds and  intact distal pulses.   Pulmonary/Chest: Effort normal and breath sounds normal. No respiratory distress. She has no wheezes. She has no rales. She exhibits no tenderness.  Neurological: She is alert and oriented to person, place, and time.  Skin: Skin is warm and dry. No rash noted.  Psychiatric: Affect normal.  Vitals reviewed.   No results found for this or any previous visit (from the past 2160 hour(s)).  Assessment/Plan: Polymyalgia ([facility]) Pain much improved with Cymbalta. Will continue in addition to chronic medications. Follow-up 3 months.  OSA (obstructive sleep apnea) Needs new equipment due to age of current machine and supplier is out of state. Will set up order with [employer]. Patient is aware she may need new study before insurance will pay for new equipment.

## 2015-03-04 NOTE — Assessment & Plan Note (Signed)
Pain much improved with Cymbalta. Will continue in addition to chronic medications. Follow-up 3 months.

## 2015-03-06 ENCOUNTER — Encounter (HOSPITAL_BASED_OUTPATIENT_CLINIC_OR_DEPARTMENT_OTHER): Payer: Self-pay

## 2015-03-06 ENCOUNTER — Emergency Department (HOSPITAL_BASED_OUTPATIENT_CLINIC_OR_DEPARTMENT_OTHER)
Admission: EM | Admit: 2015-03-06 | Discharge: 2015-03-06 | Disposition: A | Discharge status: Home / Self Care | Payer: 59 | Attending: Emergency Medicine | Admitting: Emergency Medicine

## 2015-03-06 DIAGNOSIS — W1839XA Other fall on same level, initial encounter: Secondary | ICD-10-CM | POA: Insufficient documentation

## 2015-03-06 DIAGNOSIS — I10 Essential (primary) hypertension: Secondary | ICD-10-CM | POA: Diagnosis not present

## 2015-03-06 DIAGNOSIS — S4991XA Unspecified injury of right shoulder and upper arm, initial encounter: Secondary | ICD-10-CM | POA: Diagnosis not present

## 2015-03-06 DIAGNOSIS — S29001A Unspecified injury of muscle and tendon of front wall of thorax, initial encounter: Secondary | ICD-10-CM | POA: Diagnosis not present

## 2015-03-06 DIAGNOSIS — Y93K1 Activity, walking an animal: Secondary | ICD-10-CM | POA: Insufficient documentation

## 2015-03-06 DIAGNOSIS — Y998 Other external cause status: Secondary | ICD-10-CM | POA: Diagnosis not present

## 2015-03-06 DIAGNOSIS — Y9289 Other specified places as the place of occurrence of the external cause: Secondary | ICD-10-CM | POA: Diagnosis not present

## 2015-03-06 DIAGNOSIS — S6991XA Unspecified injury of right wrist, hand and finger(s), initial encounter: Secondary | ICD-10-CM | POA: Insufficient documentation

## 2015-03-06 NOTE — ED Notes (Signed)
Pt has decided to leave and come back when we are less busy, advised to stay, pt still wants to leave as she does not want to sit and wait that long

## 2015-03-06 NOTE — ED Notes (Signed)
Pt fell around noon and injured right arm while walking the dog, c/o right hand and right wrist pain as well as right rib cage pain

## 2015-03-12 ENCOUNTER — Telehealth: Payer: Self-pay | Admitting: *Deleted

## 2015-03-12 DIAGNOSIS — G4733 Obstructive sleep apnea (adult) (pediatric): Secondary | ICD-10-CM

## 2015-03-12 NOTE — Telephone Encounter (Signed)
Spoke with patient and informed [per provider VO] that we either need the records of her last sleep study [for [facility]], if within past 5 years] or we will need to enter referral to pulmonology to set up new sleep study [with Dr. Alva]; pt states it was much longer than 5 years ago, understood & agreed. New referral placed/SLS 03/06

## 2015-03-15 ENCOUNTER — Other Ambulatory Visit: Payer: Self-pay | Admitting: Physician Assistant

## 2015-03-15 DIAGNOSIS — M353 Polymyalgia rheumatica: Secondary | ICD-10-CM

## 2015-03-16 MED ORDER — AMLODIPINE BESYLATE 10 MG PO TABS: 10.0000 mg | ORAL_TABLET | Freq: Every day | ORAL | Status: DC

## 2015-03-16 MED ORDER — ATENOLOL 100 MG PO TABS: 100.0000 mg | ORAL_TABLET | Freq: Every day | ORAL | Status: DC

## 2015-03-16 MED ORDER — PREDNISONE 5 MG PO TABS: 10.0000 mg | ORAL_TABLET | Freq: Every day | ORAL | Status: DC

## 2015-04-12 ENCOUNTER — Encounter: Payer: Self-pay | Admitting: Physician Assistant

## 2015-04-13 MED ORDER — VALSARTAN-HYDROCHLOROTHIAZIDE 320-25 MG PO TABS: 1.0000 | ORAL_TABLET | Freq: Every day | ORAL | Status: DC

## 2015-04-18 DIAGNOSIS — M353 Polymyalgia rheumatica: Secondary | ICD-10-CM | POA: Insufficient documentation

## 2015-04-25 ENCOUNTER — Other Ambulatory Visit: Payer: Self-pay | Admitting: Physician Assistant

## 2015-04-25 DIAGNOSIS — E559 Vitamin D deficiency, unspecified: Secondary | ICD-10-CM

## 2015-04-25 NOTE — Telephone Encounter (Signed)
Follow lab -- ok to order Vitamin D level

## 2015-04-25 NOTE — Telephone Encounter (Signed)
Last vitamin D level 10/27/14 was 49. Do you want to refill or bring pt in for follow up labs?

## 2015-04-26 ENCOUNTER — Ambulatory Visit (INDEPENDENT_AMBULATORY_CARE_PROVIDER_SITE_OTHER): Payer: 59 | Admitting: Pulmonary Disease

## 2015-04-26 ENCOUNTER — Encounter: Payer: Self-pay | Admitting: Pulmonary Disease

## 2015-04-26 VITALS — BP 129/78 | HR 63 | Ht 67.0 in | Wt 217.0 lb

## 2015-04-26 DIAGNOSIS — G4733 Obstructive sleep apnea (adult) (pediatric): Secondary | ICD-10-CM | POA: Diagnosis not present

## 2015-04-26 NOTE — Assessment & Plan Note (Signed)
We made attempts to contact her DME and her doctor's office in [place] but could not obtain a copy of her sleep study. I would like to obtain her download from her current CPAP We will connect her with her DME to obtain CPAP supplies   Weight loss encouraged, compliance with goal of at least 4-6 hrs every night is the expectation. Advised against medications with sedative side effects Cautioned against driving when sleepy - understanding that sleepiness will vary on a day to day basis

## 2015-04-26 NOTE — Patient Instructions (Signed)
We will set you up with the DME And provide you with CPAP supplies including nasal pillows

## 2015-04-26 NOTE — Progress Notes (Signed)
Subjective:    Patient ID: Shannon [name], female    DOB: 10/06/1950, 65 y.o.   MRN: [id number]  HPI  Chief Complaint  Patient presents with  . Sleep Consult    Referred Dr. Hassell Done; wears CPAP machine every night, approx. 7 hours nightly. needs to be set up with DME locally and new CPAP supplies. Epworth Score: 39    65 year old woman with OSA presents to establish care. She has moved from [place] to [place]. She needs CPAP supplies and has to establish with a new DME  She was initially diagnosed in [place] about 10 years ago and has been maintained on CPAP since then. This is her second machine which she obtained 2 years ago, she uses nasal pillows and is very compliant. She had good results with CPAP including improvement in her daytime fatigue and somnolence and abolishment of snoring per her husband. Epworth sleepiness score is 2 Bedtime is around 10:30 PM, sleep latency is minimal, she sleeps on her left side with one pillow reports one nocturnal awakening and is out of bed by 6 AM feeling rested without dryness of mouth or headaches. She was just started on prednisone due to hip pain and has developed insomnia bedtime as late as 1 AM. She has gained about 20 pounds over the last few years. There is no history suggestive of cataplexy, sleep paralysis or parasomnias  She worked for a [employer] before retiring she quit smoking in October 2015.    Past Medical History  Diagnosis Date  . Whiplash injury 06/07/2010  . Bronchitis   . Hypertension   . Anxiety   . Vitamin D deficiency   . Degenerative disorder of bone   . Herniated disc, cervical   . Arthritis     neck and back  . Pinched nerve in neck   . Sleep apnea   . GERD (gastroesophageal reflux disease)   . Osteopenia   . Depression   . Obstructive sleep apnea    Past Surgical History  Procedure Laterality Date  . Appendectomy    . Arm surgery Left     Allergies  Allergen Reactions    . Ace Inhibitors Cough  . Oxycodone     hallucinations  . Sulfur Nausea And Vomiting  . Voltaren [Diclofenac Sodium] Other (See Comments)    Hypersensitivity    Social History   Social History  . Marital Status: Married    Spouse Name: N/A  . Number of Children: N/A  . Years of Education: N/A   Occupational History  . Not on file.   Social History Main Topics  . Smoking status: Former Smoker    Quit date: 09/06/2013  . Smokeless tobacco: Not on file     Comment: Pt started to use vaporizer-uses rarely  . Alcohol Use: 0.0 oz/week    0 Standard drinks or equivalent per week     Comment: Pt drinks wine 1-2 per month  . Drug Use: No  . Sexual Activity: Not on file   Other Topics Concern  . Not on file   Social History Narrative     Family History  Problem Relation Age of Onset  . Cancer Mother   . Stroke Father   . Hypertension Father   . Diabetes Neg Hx      Review of Systems  Constitutional: Negative for fever, chills and unexpected weight change.  HENT: Negative for congestion, dental problem, ear pain, nosebleeds, postnasal drip, rhinorrhea, sinus pressure,  sneezing, sore throat, trouble swallowing and voice change.   Eyes: Negative for visual disturbance.  Respiratory: Negative for cough, choking and shortness of breath.   Cardiovascular: Negative for chest pain and leg swelling.  Gastrointestinal: Negative for vomiting, abdominal pain and diarrhea.  Genitourinary: Negative for difficulty urinating.  Musculoskeletal: Negative for arthralgias.  Skin: Negative for rash.  Neurological: Negative for tremors, syncope and headaches.  Hematological: Does not bruise/bleed easily.       Objective:   Physical Exam  Gen. Pleasant, obese, in no distress ENT - no lesions, no post nasal drip Neck: No JVD, no thyromegaly, no carotid bruits Lungs: no use of accessory muscles, no dullness to percussion, decreased without rales or rhonchi  Cardiovascular: Rhythm  regular, heart sounds  normal, no murmurs or gallops, no peripheral edema Musculoskeletal: No deformities, no cyanosis or clubbing , no tremors       Assessment & Plan:

## 2015-05-01 NOTE — Telephone Encounter (Signed)
Lab appt scheduled and future orders placed.

## 2015-05-02 ENCOUNTER — Other Ambulatory Visit (INDEPENDENT_AMBULATORY_CARE_PROVIDER_SITE_OTHER): Payer: 59

## 2015-05-02 DIAGNOSIS — E559 Vitamin D deficiency, unspecified: Secondary | ICD-10-CM | POA: Diagnosis not present

## 2015-05-02 LAB — VITAMIN D 25 HYDROXY (VIT D DEFICIENCY, FRACTURES): VITD: 50.74 ng/mL (ref 30.00–100.00)

## 2015-05-07 ENCOUNTER — Encounter: Payer: Self-pay | Admitting: Physician Assistant

## 2015-05-09 MED ORDER — SERTRALINE HCL 100 MG PO TABS: 100.0000 mg | ORAL_TABLET | Freq: Every day | ORAL | Status: DC

## 2015-05-23 ENCOUNTER — Other Ambulatory Visit: Payer: Self-pay | Admitting: Physician Assistant

## 2015-05-23 NOTE — Telephone Encounter (Signed)
Refill sent per [facility] refill protocol/SLS  

## 2015-05-30 ENCOUNTER — Ambulatory Visit: Payer: 59 | Admitting: Physician Assistant

## 2015-05-31 ENCOUNTER — Telehealth: Payer: Self-pay | Admitting: Pulmonary Disease

## 2015-05-31 NOTE — Telephone Encounter (Signed)
PSG 05/2004-mild OSA with AHI 11/hour corrected by CPAP 9 cm

## 2015-06-01 ENCOUNTER — Ambulatory Visit (INDEPENDENT_AMBULATORY_CARE_PROVIDER_SITE_OTHER): Payer: PRIVATE HEALTH INSURANCE | Admitting: Physician Assistant

## 2015-06-01 ENCOUNTER — Encounter: Payer: Self-pay | Admitting: Physician Assistant

## 2015-06-01 VITALS — BP 108/72 | HR 60 | Temp 98.8°F | Ht 67.0 in | Wt 219.4 lb

## 2015-06-01 DIAGNOSIS — F418 Other specified anxiety disorders: Secondary | ICD-10-CM

## 2015-06-01 DIAGNOSIS — I10 Essential (primary) hypertension: Secondary | ICD-10-CM | POA: Diagnosis not present

## 2015-06-01 DIAGNOSIS — F419 Anxiety disorder, unspecified: Secondary | ICD-10-CM

## 2015-06-01 DIAGNOSIS — IMO0001 Reserved for inherently not codable concepts without codable children: Secondary | ICD-10-CM

## 2015-06-01 DIAGNOSIS — R61 Generalized hyperhidrosis: Secondary | ICD-10-CM

## 2015-06-01 DIAGNOSIS — F329 Major depressive disorder, single episode, unspecified: Secondary | ICD-10-CM

## 2015-06-01 LAB — TSH: TSH: 0.6 mIU/L

## 2015-06-01 LAB — T4: T4, Total: 6.7 ug/dL (ref 4.5–12.0)

## 2015-06-01 NOTE — Progress Notes (Signed)
Pre visit review using our clinic review tool, if applicable. No additional management support is needed unless otherwise documented below in the visit note. 

## 2015-06-01 NOTE — Patient Instructions (Signed)
Please go to the lab for blood work. Continue medications as directed but decrease Amlodipine to 5 mg (1/2 tablet) daily. Call Rheumatology to discuss dose of Prednisone.  We will alter regimen based on labs, Rheumatology's input and research I discussed with you.

## 2015-06-04 NOTE — Progress Notes (Signed)
Patient presents to clinic today for follow-up of anxiety/depression and hypertension.   Patient currently on regimen of amlodipine, atenolol, diovan-hct. Endorses taking as directed. Patient denies chest pain, palpitations, dizziness, vision changes or frequent headaches. Has noted lightheadedness on standing.   BP Readings from Last 3 Encounters:  06/01/15 108/72  04/26/15 129/78  03/06/15 125/64   Patient also currently on Sertraline and Cymbalta for mood and chronic pain from fibromyalgia. Endorses doing well overall on current regimen. Denies SI/HI. Is feeling better recently than she has in a long time.   Past Medical History  Diagnosis Date  . Whiplash injury 06/07/2010  . Bronchitis   . Hypertension   . Anxiety   . Vitamin D deficiency   . Degenerative disorder of bone   . Herniated disc, cervical   . Arthritis     neck and back  . Pinched nerve in neck   . Sleep apnea   . GERD (gastroesophageal reflux disease)   . Osteopenia   . Depression   . Obstructive sleep apnea     Current Outpatient Prescriptions on File Prior to Visit  Medication Sig Dispense Refill  . albuterol (PROVENTIL HFA;VENTOLIN HFA) 108 (90 BASE) MCG/ACT inhaler Inhale 1 puff into the lungs every 4 (four) hours as needed for wheezing or shortness of breath.    . ALPRAZolam (XANAX) 0.5 MG tablet Take 0.5 mg by mouth 3 (three) times daily as needed for anxiety.    Marland Kitchen amLODipine (NORVASC) 10 MG tablet Take 1 tablet (10 mg total) by mouth daily. 90 tablet 1  . atenolol (TENORMIN) 100 MG tablet Take 1 tablet (100 mg total) by mouth daily. 90 tablet 1  . Calcium Carbonate-Vitamin D (CALCIUM 600+D) 600-200 MG-UNIT TABS Take 1 tablet by mouth daily.    . DULoxetine (CYMBALTA) 30 MG capsule TAKE ONE CAPSULE BY MOUTH ONCE DAILY 30 capsule 3  . ergocalciferol (VITAMIN D2) 50000 units capsule Take 1 capsule (50,000 Units total) by mouth once a week. (Patient taking differently: Take 50,000 Units by mouth every  30 (thirty) days. ) 12 capsule 0  . meclizine (ANTIVERT) 25 MG tablet Take 1 tablet (25 mg total) by mouth 3 (three) times daily as needed for dizziness. 30 tablet 0  . naproxen (NAPROSYN) 500 MG tablet Take 500 mg by mouth 2 (two) times daily with a meal.    . predniSONE (DELTASONE) 5 MG tablet Take 2 tablets (10 mg total) by mouth daily with breakfast. (Patient taking differently: Take 40 mg by mouth daily with breakfast. ) 60 tablet 1  . sertraline (ZOLOFT) 100 MG tablet Take 1 tablet (100 mg total) by mouth daily. 30 tablet 3  . valsartan-hydrochlorothiazide (DIOVAN-HCT) 320-25 MG tablet Take 1 tablet by mouth daily. 90 tablet 1   No current facility-administered medications on file prior to visit.    Allergies  Allergen Reactions  . Ace Inhibitors Cough  . Oxycodone     hallucinations  . Sulfur Nausea And Vomiting  . Voltaren [Diclofenac Sodium] Other (See Comments)    Hypersensitivity    Family History  Problem Relation Age of Onset  . Cancer Mother   . Stroke Father   . Hypertension Father   . Diabetes Neg Hx     Social History   Social History  . Marital Status: Married    Spouse Name: N/A  . Number of Children: N/A  . Years of Education: N/A   Social History Main Topics  . Smoking status: Former  Smoker    Quit date: 09/06/2013  . Smokeless tobacco: None     Comment: Pt started to use vaporizer-uses rarely  . Alcohol Use: 0.0 oz/week    0 Standard drinks or equivalent per week     Comment: Pt drinks wine 1-2 per month  . Drug Use: No  . Sexual Activity: Not Asked   Other Topics Concern  . None   Social History Narrative    Review of Systems - See HPI.  All other ROS are negative.  BP 108/72 mmHg  Pulse 60  Temp(Src) 98.8 F (37.1 C) (Oral)  Ht 5\' 7"  (1.702 m)  Wt 219 lb 6 oz (99.508 kg)  BMI 34.35 kg/m2  SpO2 97%  Physical Exam  Constitutional: She is oriented to person, place, and time and well-developed, well-nourished, and in no distress.    HENT:  Head: Normocephalic and atraumatic.  Eyes: Conjunctivae are normal.  Cardiovascular: Normal rate, regular rhythm, normal heart sounds and intact distal pulses.   Pulmonary/Chest: Effort normal. No respiratory distress. She has wheezes. She has no rales. She exhibits no tenderness.  Neurological: She is alert and oriented to person, place, and time.  Skin: Skin is warm and dry. No rash noted.  Psychiatric: Affect normal.  Vitals reviewed.   Recent Results (from the past 2160 hour(s))  Vitamin D (25 hydroxy)     Status: None   Collection Time: 05/02/15  1:59 PM  Result Value Ref Range   VITD 50.74 30.00 - 100.00 ng/mL  TSH     Status: None   Collection Time: 06/01/15  4:03 PM  Result Value Ref Range   TSH 0.60 mIU/L    Comment:   Reference Range   > or = 20 Years  0.40-4.50   Pregnancy Range First trimester  0.26-2.66 Second trimester 0.55-2.73 Third trimester  0.43-2.91     T4     Status: None   Collection Time: 06/01/15  4:03 PM  Result Value Ref Range   T4, Total 6.7 4.5 - 12.0 ug/dL    Assessment/Plan: 1. Essential hypertension BP low today. Will decrease amlodipine to 5 mg daily. Continue other medications as directed along with good hydration. FU 2 weeks for reassessment.  2. Sweating Suspect related to amount of prednisone currently prescribed by specialist. Will check TSH and T4. Discussed with her that other medications could be contributing, especially sertraline and Cymbalta and we could consider changing regimen. She declines until lab assessment is complete and until she speaks with her specialist. - TSH - T4  3. Anxiety and Depression Will continue current medication regimen for now as mood overall well controlled.

## 2015-06-06 ENCOUNTER — Other Ambulatory Visit: Payer: Self-pay | Admitting: Physician Assistant

## 2015-06-06 ENCOUNTER — Encounter: Payer: Self-pay | Admitting: Physician Assistant

## 2015-06-06 DIAGNOSIS — IMO0001 Reserved for inherently not codable concepts without codable children: Secondary | ICD-10-CM

## 2015-06-07 ENCOUNTER — Other Ambulatory Visit (INDEPENDENT_AMBULATORY_CARE_PROVIDER_SITE_OTHER): Payer: PRIVATE HEALTH INSURANCE

## 2015-06-07 DIAGNOSIS — M609 Myositis, unspecified: Secondary | ICD-10-CM | POA: Diagnosis not present

## 2015-06-07 DIAGNOSIS — IMO0001 Reserved for inherently not codable concepts without codable children: Secondary | ICD-10-CM

## 2015-06-07 DIAGNOSIS — M791 Myalgia: Secondary | ICD-10-CM | POA: Diagnosis not present

## 2015-06-07 LAB — SEDIMENTATION RATE: Sed Rate: 14 mm/hr (ref 0–30)

## 2015-06-14 ENCOUNTER — Telehealth: Payer: Self-pay | Admitting: Pulmonary Disease

## 2015-06-14 NOTE — Telephone Encounter (Signed)
LMTCB  Per chart RA received copy of SST. Order for supplies was pended until this was received as it will have to be sent to new DME.

## 2015-06-15 NOTE — Telephone Encounter (Signed)
lmomtcb for pt 

## 2015-06-18 NOTE — Telephone Encounter (Signed)
I called Shannon [name] and explained that I was not aware that the sleep study had been brought into the office. I LVM that I had given it to [place] from [place] along with the order

## 2015-06-18 NOTE — Telephone Encounter (Signed)
lmtcb x2 for pt. 

## 2015-06-18 NOTE — Telephone Encounter (Signed)
Spoke with the pt  She states Rodena Piety was waiting on her PSG report before CPAP supplies order could be sent  She states that she dropped off a copy of her results last wk and still has not received her CPAP supplies  Rodena Piety, do you recall this?

## 2015-06-18 NOTE — Telephone Encounter (Signed)
Shannon [name] from [facility] just so happened to be in the office today and I printed her a copy of the sleep study that has now been scanned into the patients chart and gave her a copy of the original order

## 2015-07-25 ENCOUNTER — Telehealth: Payer: Self-pay | Admitting: Pulmonary Disease

## 2015-07-25 NOTE — Telephone Encounter (Signed)
Need to make sure that they have checked BOTH pt's insurances as pt has double coverages.   LMTCB for [employer]

## 2015-07-26 NOTE — Telephone Encounter (Signed)
Called spoke with pt. She states that she discussed the insurance issue with the pt on 07/25/15. She states that the patient only uses one insurance company at this time. She states she offered a cash amount for the CPAP and a web site where she can get discounted supplies. She states the patient is aware of all the options and that she was calling to make [place] aware of situation. She voiced understanding and had no further.   Will send message to RA to make him aware

## 2015-08-14 ENCOUNTER — Encounter: Payer: Self-pay | Admitting: Physician Assistant

## 2015-08-14 ENCOUNTER — Ambulatory Visit (INDEPENDENT_AMBULATORY_CARE_PROVIDER_SITE_OTHER): Payer: PRIVATE HEALTH INSURANCE | Admitting: Physician Assistant

## 2015-08-14 VITALS — BP 116/68 | HR 64 | Temp 98.1°F | Resp 16 | Ht 67.0 in | Wt 221.5 lb

## 2015-08-14 DIAGNOSIS — R0789 Other chest pain: Secondary | ICD-10-CM | POA: Diagnosis not present

## 2015-08-14 DIAGNOSIS — L932 Other local lupus erythematosus: Secondary | ICD-10-CM | POA: Diagnosis not present

## 2015-08-14 DIAGNOSIS — T465X5A Adverse effect of other antihypertensive drugs, initial encounter: Principal | ICD-10-CM

## 2015-08-14 MED ORDER — VALSARTAN 320 MG PO TABS: 320.0000 mg | ORAL_TABLET | Freq: Every day | ORAL | 1 refills | Status: DC

## 2015-08-14 NOTE — Patient Instructions (Signed)
Please stop the Diovan HCT and start the generic Valsartan as directed. Increase amlodipine to 10 mg daily.  Continue all other medications as directed.  Check BP at home and record readings.  If you notice BP > 140/90 on multiple readings, please call me.  Please continue prednisone until you see Rheumatology.  Please speak to him regarding potential for drug-induced lupus. We will see how you respond off of the HCTZ.  Follow-up with me in 2 weeks.

## 2015-08-14 NOTE — Progress Notes (Signed)
Patient presents to clinic today to discuss recent Rheumatology appointments and continued symptoms. Patient is currently followed by specialist at [place] being treated for polymyalgia rheumatica. Denies improvement in symptoms with steroids. States that the rheumatologist is uncertain about her diagnosis. She does endorse she is unhappy because they have not done any further testing. Patient states she has done some research on drug induced lupus and feels that a lot of her symptoms fit. She would like to discuss this today.   Past Medical History:  Diagnosis Date  . Anxiety   . Arthritis    neck and back  . Bronchitis   . Degenerative disorder of bone   . Depression   . GERD (gastroesophageal reflux disease)   . Herniated disc, cervical   . Hypertension   . Obstructive sleep apnea   . Osteopenia   . Pinched nerve in neck   . Sleep apnea   . Vitamin D deficiency   . Whiplash injury 06/07/2010    Current Outpatient Prescriptions on File Prior to Visit  Medication Sig Dispense Refill  . albuterol (PROVENTIL HFA;VENTOLIN HFA) 108 (90 BASE) MCG/ACT inhaler Inhale 1 puff into the lungs every 4 (four) hours as needed for wheezing or shortness of breath.    . ALPRAZolam (XANAX) 0.5 MG tablet Take 0.5 mg by mouth 3 (three) times daily as needed for anxiety.    Marland Kitchen amLODipine (NORVASC) 10 MG tablet Take 1 tablet (10 mg total) by mouth daily. (Patient taking differently: Take 5 mg by mouth daily. ) 90 tablet 1  . atenolol (TENORMIN) 100 MG tablet Take 1 tablet (100 mg total) by mouth daily. 90 tablet 1  . Calcium Carbonate-Vitamin D (CALCIUM 600+D) 600-200 MG-UNIT TABS Take 1 tablet by mouth daily.    . DULoxetine (CYMBALTA) 30 MG capsule TAKE ONE CAPSULE BY MOUTH ONCE DAILY 30 capsule 3  . ergocalciferol (VITAMIN D2) 50000 units capsule Take 1 capsule (50,000 Units total) by mouth once a week. (Patient taking differently: Take 50,000 Units by mouth every 30 (thirty) days. ) 12 capsule  0  . meclizine (ANTIVERT) 25 MG tablet Take 1 tablet (25 mg total) by mouth 3 (three) times daily as needed for dizziness. 30 tablet 0  . predniSONE (DELTASONE) 5 MG tablet Take 2 tablets (10 mg total) by mouth daily with breakfast. (Patient taking differently: Take 12.5 mg by mouth daily with breakfast. ) 60 tablet 1  . valsartan-hydrochlorothiazide (DIOVAN-HCT) 320-25 MG tablet Take 1 tablet by mouth daily. 90 tablet 1  . sertraline (ZOLOFT) 100 MG tablet Take 1 tablet (100 mg total) by mouth daily. (Patient not taking: Reported on 08/14/2015) 30 tablet 3   No current facility-administered medications on file prior to visit.     Allergies  Allergen Reactions  . Ace Inhibitors Cough  . Oxycodone     hallucinations  . Sulfur Nausea And Vomiting  . Voltaren [Diclofenac Sodium] Other (See Comments)    Hypersensitivity    Family History  Problem Relation Age of Onset  . Cancer Mother   . Stroke Father   . Hypertension Father   . Diabetes Neg Hx     Social History   Social History  . Marital status: Married    Spouse name: N/A  . Number of children: N/A  . Years of education: N/A   Social History Main Topics  . Smoking status: Former Smoker    Quit date: 09/06/2013  . Smokeless tobacco: None  Comment: Pt started to use vaporizer-uses rarely  . Alcohol use 0.0 oz/week     Comment: Pt drinks wine 1-2 per month  . Drug use: No  . Sexual activity: Not Asked   Other Topics Concern  . None   Social History Narrative  . None   Review of Systems - See HPI.  All other ROS are negative.  BP 116/68 (BP Location: Left Arm, Patient Position: Sitting, Cuff Size: Large)   Pulse 64   Temp 98.1 F (36.7 C) (Oral)   Resp 16   Ht '5\' 7"'  (1.702 m)   Wt 221 lb 8 oz (100.5 kg)   SpO2 98%   BMI 34.69 kg/m   Physical Exam  Constitutional: She is oriented to person, place, and time and well-developed, well-nourished, and in no distress.  HENT:  Head: Normocephalic and atraumatic.    Eyes: Conjunctivae are normal.  Neck: Neck supple.  Cardiovascular: Normal rate, regular rhythm, normal heart sounds and intact distal pulses.   Pulmonary/Chest: Effort normal and breath sounds normal. No respiratory distress. She has no wheezes. She has no rales. She exhibits no tenderness.  Neurological: She is alert and oriented to person, place, and time.  Skin: Skin is warm and dry. No rash noted.  Psychiatric: Affect normal.  Vitals reviewed.   Recent Results (from the past 2160 hour(s))  TSH     Status: None   Collection Time: 06/01/15  4:03 PM  Result Value Ref Range   TSH 0.60 mIU/L    Comment:   Reference Range   > or = 20 Years  0.40-4.50   Pregnancy Range First trimester  0.26-2.66 Second trimester 0.55-2.73 Third trimester  0.43-2.91     T4     Status: None   Collection Time: 06/01/15  4:03 PM  Result Value Ref Range   T4, Total 6.7 4.5 - 12.0 ug/dL  Sed Rate (ESR)     Status: None   Collection Time: 06/07/15  1:21 PM  Result Value Ref Range   Sed Rate 14 0 - 30 mm/hr    Assessment/Plan: 1. Chest tightness Noted at end of visit. Intermittent and typically associated with anxiety. EKG obtained revealing sinus rhythm. Discussed breathing exercises for stress/anxiety.  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 Will defer to rheumatology for further lab testing per her preference. She has appointment in 2 weeks. Her symptoms, arthralgias, fatigue, intermittent rash, etc, show the potential for drug-induced lupus. She also fits the common age range at presentation 50-70. Her hydrochlorothiazide can definitely be a culprit. We'll attempt trial off of the hydrochlorothiazide, using plain Diovan instead. We'll increase amlodipine dose to compensate for lack of HCTZ. Patient to check blood pressures daily. She is to monitor her progress.    Leeanne Rio, PA-C

## 2015-08-21 ENCOUNTER — Encounter: Payer: Self-pay | Admitting: Physician Assistant

## 2015-08-28 ENCOUNTER — Encounter: Payer: Self-pay | Admitting: Physician Assistant

## 2015-08-28 ENCOUNTER — Ambulatory Visit (INDEPENDENT_AMBULATORY_CARE_PROVIDER_SITE_OTHER): Payer: PRIVATE HEALTH INSURANCE | Admitting: Physician Assistant

## 2015-08-28 VITALS — BP 138/72 | HR 66 | Temp 98.2°F | Resp 16 | Ht 67.0 in | Wt 222.4 lb

## 2015-08-28 DIAGNOSIS — Z780 Asymptomatic menopausal state: Secondary | ICD-10-CM

## 2015-08-28 DIAGNOSIS — E559 Vitamin D deficiency, unspecified: Secondary | ICD-10-CM

## 2015-08-28 DIAGNOSIS — I10 Essential (primary) hypertension: Secondary | ICD-10-CM

## 2015-08-28 NOTE — Patient Instructions (Signed)
Please go to the lab for blood work. I will call with results.  Continue current regimen. Your BP looks good and I am so glad you are feeling better. We will stay off of the HCTZ.  You will be contacted for a bone density test.  Follow-up with me in 6 months.

## 2015-08-29 ENCOUNTER — Other Ambulatory Visit: Payer: Self-pay | Admitting: Physician Assistant

## 2015-08-29 DIAGNOSIS — Z780 Asymptomatic menopausal state: Secondary | ICD-10-CM | POA: Insufficient documentation

## 2015-08-29 DIAGNOSIS — I10 Essential (primary) hypertension: Secondary | ICD-10-CM

## 2015-08-29 DIAGNOSIS — E559 Vitamin D deficiency, unspecified: Secondary | ICD-10-CM | POA: Insufficient documentation

## 2015-08-29 DIAGNOSIS — R9431 Abnormal electrocardiogram [ECG] [EKG]: Secondary | ICD-10-CM

## 2015-08-29 DIAGNOSIS — Z8249 Family history of ischemic heart disease and other diseases of the circulatory system: Secondary | ICD-10-CM

## 2015-08-29 LAB — VITAMIN D 25 HYDROXY (VIT D DEFICIENCY, FRACTURES): VITD: 48.92 ng/mL (ref 30.00–100.00)

## 2015-08-29 MED ORDER — ERGOCALCIFEROL 1.25 MG (50000 UT) PO CAPS: 50000.0000 [IU] | ORAL_CAPSULE | ORAL | 3 refills | Status: DC

## 2015-08-29 NOTE — Assessment & Plan Note (Signed)
With history of vitamin D deficiency. Chronic steroid use. Will obtain bone density. We'll repeat vitamin D level.

## 2015-08-29 NOTE — Assessment & Plan Note (Signed)
Blood pressure stable with new regimen. Symptoms have resolved with discontinuation of hydrochlorothiazide. We'll continue current regimen. Dietary and exercise recommendations reviewed. Follow-up scheduled.

## 2015-08-29 NOTE — Progress Notes (Signed)
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 At last visit patient's Diovan HCT was switched to plain Diovan. Amlodipine was increased to 10 mg. Patient endorses taking medications daily as directed. Endorses almost complete resolution in pain of her hips and shoulders since stopping hydrochlorothiazide. Endorses she is sleeping better and has more energy. Endorses uplifted mood. Patient denies chest pain, palpitations, lightheadedness, dizziness, vision changes or frequent headaches.  BP Readings from Last 3 Encounters:  08/28/15 138/72  08/14/15 116/68  06/01/15 108/72     Past Medical History:  Diagnosis Date  . Anxiety   . Arthritis    neck and back  . Bronchitis   . Degenerative disorder of bone   . Depression   . GERD (gastroesophageal reflux disease)   . Herniated disc, cervical   . Hypertension   . Obstructive sleep apnea   . Osteopenia   . Pinched nerve in neck   . Sleep apnea   . Vitamin D deficiency   . Whiplash injury 06/07/2010    Current Outpatient Prescriptions on File Prior to Visit  Medication Sig Dispense Refill  . albuterol (PROVENTIL HFA;VENTOLIN HFA) 108 (90 BASE) MCG/ACT inhaler Inhale 1 puff into the lungs every 4 (four) hours as needed for wheezing or shortness of breath.    . ALPRAZolam (XANAX) 0.5 MG tablet Take 0.5 mg by mouth 3 (three) times daily as needed for anxiety.    Marland Kitchen amLODipine (NORVASC) 10 MG tablet Take 1 tablet (10 mg total) by mouth daily. 90 tablet 1  . atenolol (TENORMIN) 100 MG tablet Take 1 tablet (100 mg total) by mouth daily. 90 tablet 1  . Calcium Carbonate-Vitamin D (CALCIUM 600+D) 600-200 MG-UNIT TABS Take 1 tablet by mouth daily.    . DULoxetine (CYMBALTA) 30 MG capsule TAKE ONE CAPSULE BY MOUTH ONCE DAILY 30 capsule 3  . ergocalciferol (VITAMIN D2) 50000 units capsule Take 1 capsule (50,000 Units total) by mouth once a week. (Patient  taking differently: Take 50,000 Units by mouth every 30 (thirty) days. ) 12 capsule 0  . meclizine (ANTIVERT) 25 MG tablet Take 1 tablet (25 mg total) by mouth 3 (three) times daily as needed for dizziness. 30 tablet 0  . predniSONE (DELTASONE) 5 MG tablet Take 2 tablets (10 mg total) by mouth daily with breakfast. 60 tablet 1  . valsartan (DIOVAN) 320 MG tablet Take 1 tablet (320 mg total) by mouth daily. 30 tablet 1  . sertraline (ZOLOFT) 100 MG tablet Take 1 tablet (100 mg total) by mouth daily. (Patient not taking: Reported on 08/14/2015) 30 tablet 3   No current facility-administered medications on file prior to visit.     Allergies  Allergen Reactions  . Ace Inhibitors Cough  . Oxycodone     hallucinations  . Sulfur Nausea And Vomiting  . Voltaren [Diclofenac Sodium] Other (See Comments)    Hypersensitivity    Family History  Problem Relation Age of Onset  . Cancer Mother   . Stroke Father   . Hypertension Father   . Diabetes Neg Hx     Social History   Social History  . Marital status: Married    Spouse name: N/A  . Number of children: N/A  . Years of education: N/A   Social History Main Topics  . Smoking status: Former Smoker    Quit date: 09/06/2013  . Smokeless tobacco: None     Comment:  Pt started to use vaporizer-uses rarely  . Alcohol use 0.0 oz/week     Comment: Pt drinks wine 1-2 per month  . Drug use: No  . Sexual activity: Not Asked   Other Topics Concern  . None   Social History Narrative  . None    Review of Systems - See HPI.  All other ROS are negative.  BP 138/72 (BP Location: Left Arm, Patient Position: Sitting, Cuff Size: Large)   Pulse 66   Temp 98.2 F (36.8 C) (Oral)   Resp 16   Ht '5\' 7"'  (1.702 m)   Wt 222 lb 6 oz (100.9 kg)   SpO2 98%   BMI 34.83 kg/m   Physical Exam  Constitutional: She is oriented to person, place, and time and well-developed, well-nourished, and in no distress.  HENT:  Head: Normocephalic and atraumatic.    Eyes: Conjunctivae are normal.  Neck: Neck supple.  Cardiovascular: Normal rate, regular rhythm, normal heart sounds and intact distal pulses.   Pulmonary/Chest: Effort normal and breath sounds normal. No respiratory distress. She has no wheezes. She has no rales. She exhibits no tenderness.  Neurological: She is alert and oriented to person, place, and time.  Skin: Skin is warm and dry. No rash noted.  Psychiatric: Affect normal.  Vitals reviewed.   Recent Results (from the past 2160 hour(s))  TSH     Status: None   Collection Time: 06/01/15  4:03 PM  Result Value Ref Range   TSH 0.60 mIU/L    Comment:   Reference Range   > or = 20 Years  0.40-4.50   Pregnancy Range First trimester  0.26-2.66 Second trimester 0.55-2.73 Third trimester  0.43-2.91     T4     Status: None   Collection Time: 06/01/15  4:03 PM  Result Value Ref Range   T4, Total 6.7 4.5 - 12.0 ug/dL  Sed Rate (ESR)     Status: None   Collection Time: 06/07/15  1:21 PM  Result Value Ref Range   Sed Rate 14 0 - 30 mm/hr  Vitamin D (25 hydroxy)     Status: None   Collection Time: 08/28/15  4:30 PM  Result Value Ref Range   VITD 48.92 30.00 - 100.00 ng/mL    Assessment/Plan: Essential hypertension Blood pressure stable with new regimen. Symptoms have resolved with discontinuation of hydrochlorothiazide. We'll continue current regimen. Dietary and exercise recommendations reviewed. Follow-up scheduled.  Postmenopausal estrogen deficiency With history of vitamin D deficiency. Chronic steroid use. Will obtain bone density. We'll repeat vitamin D level.    Leeanne Rio, PA-C

## 2015-09-04 ENCOUNTER — Inpatient Hospital Stay: Admission: RE | Admit: 2015-09-04 | Payer: PRIVATE HEALTH INSURANCE | Source: Ambulatory Visit

## 2015-09-05 ENCOUNTER — Telehealth (HOSPITAL_COMMUNITY): Payer: Self-pay | Admitting: *Deleted

## 2015-09-05 NOTE — Telephone Encounter (Signed)
Left message on voicemail per DPR in reference to upcoming appointment scheduled on 9/01/07/15 with detailed instructions given per Myocardial Perfusion Study Information Sheet for the test. LM to arrive 15 minutes early, and that it is imperative to arrive on time for appointment to keep from having the test rescheduled. If you need to cancel or reschedule your appointment, please call the office within 24 hours of your appointment. Failure to do so may result in a cancellation of your appointment, and a $50 no show fee. Phone number given for call back for any questions. Hubbard Robinson, RN

## 2015-09-07 ENCOUNTER — Ambulatory Visit (HOSPITAL_COMMUNITY): Payer: No Typology Code available for payment source | Attending: Internal Medicine

## 2015-09-07 DIAGNOSIS — Z8249 Family history of ischemic heart disease and other diseases of the circulatory system: Secondary | ICD-10-CM | POA: Diagnosis not present

## 2015-09-07 DIAGNOSIS — R079 Chest pain, unspecified: Secondary | ICD-10-CM | POA: Insufficient documentation

## 2015-09-07 DIAGNOSIS — R9431 Abnormal electrocardiogram [ECG] [EKG]: Secondary | ICD-10-CM | POA: Diagnosis not present

## 2015-09-07 DIAGNOSIS — I119 Hypertensive heart disease without heart failure: Secondary | ICD-10-CM | POA: Diagnosis not present

## 2015-09-07 DIAGNOSIS — I10 Essential (primary) hypertension: Secondary | ICD-10-CM | POA: Diagnosis not present

## 2015-09-07 LAB — MYOCARDIAL PERFUSION IMAGING
CHL CUP NUCLEAR SSS: 1
CHL CUP RESTING HR STRESS: 56 {beats}/min
LHR: 0.24
LVDIAVOL: 142 mL (ref 46–106)
LVSYSVOL: 75 mL
NUC STRESS TID: 1.15
Peak HR: 81 {beats}/min
SDS: 0
SRS: 1

## 2015-09-07 IMAGING — NM NM MISC PROCEDURE
9 series · 54 of 54 positions shown · non-contrast
Comparison: none

[Series 1: stress-gsp · 6.40mm/px · 6 of 512 frames shown]
[frame 43/512]
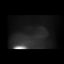
[frame 128/512]
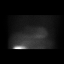
[frame 214/512]
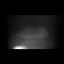
[frame 299/512]
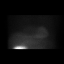
[frame 384/512]
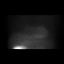
[frame 470/512]
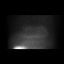

[Series 1: stress-gsp_(id)_sa · 6.4mm · 6.40mm/px · 6 of 512 frames shown]
[frame 43/512]
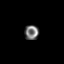
[frame 128/512]
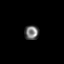
[frame 214/512]
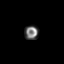
[frame 299/512]
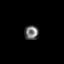
[frame 384/512]
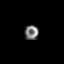
[frame 470/512]
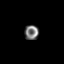

[Series 1: wbr_s-proj_st stress-gsp · 6.40mm/px · 6 of 512 frames shown]
[frame 43/512]
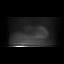
[frame 128/512]
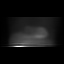
[frame 214/512]
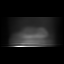
[frame 299/512]
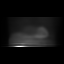
[frame 384/512]
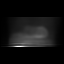
[frame 470/512]
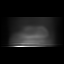

[Series 1: wbr_r-proj_st rest · 6.40mm/px · 6 of 64 frames shown]
[frame 6/64]
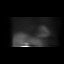
[frame 16/64]
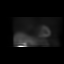
[frame 27/64]
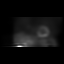
[frame 38/64]
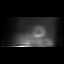
[frame 48/64]
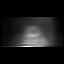
[frame 59/64]
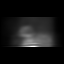

[Series 1: wbr_s-proj_st stress-sum-em · 6.40mm/px · 6 of 64 frames shown]
[frame 6/64]
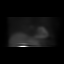
[frame 16/64]
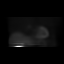
[frame 27/64]
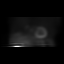
[frame 38/64]
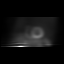
[frame 48/64]
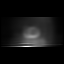
[frame 59/64]
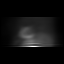

[Series 1: rest_(id)_sa · 6.4mm · 6.40mm/px · 6 of 64 frames shown]
[frame 6/64]
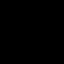
[frame 16/64]
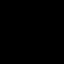
[frame 27/64]
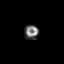
[frame 38/64]
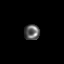
[frame 48/64]
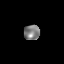
[frame 59/64]
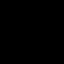

[Series 1: stress-sum-em_(id)_sa · 6.4mm · 6.40mm/px · 6 of 64 frames shown]
[frame 6/64]
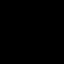
[frame 16/64]
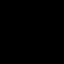
[frame 27/64]
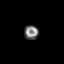
[frame 38/64]
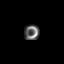
[frame 48/64]
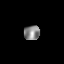
[frame 59/64]
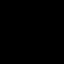

[Series 1: rest · 6.40mm/px · 6 of 64 frames shown]
[frame 6/64]
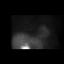
[frame 16/64]
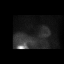
[frame 27/64]
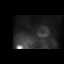
[frame 38/64]
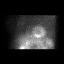
[frame 48/64]
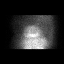
[frame 59/64]
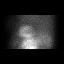

[Series 1: stress-sum-em · 6.40mm/px · 6 of 64 frames shown]
[frame 6/64]
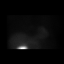
[frame 16/64]
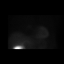
[frame 27/64]
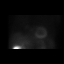
[frame 38/64]
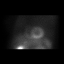
[frame 48/64]
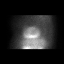
[frame 59/64]
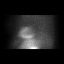

[54 of 54 positions shown; findings below may reference images not displayed]

Canned report from images found in remote index.

Refer to host system for actual result text.

## 2015-09-07 MED ORDER — TECHNETIUM TC 99M TETROFOSMIN IV KIT
32.3000 | PACK | Freq: Once | INTRAVENOUS | Status: AC | PRN
  Administered 2015-09-07: 32.3 via INTRAVENOUS
  Filled 2015-09-07: qty 32

## 2015-09-07 MED ORDER — REGADENOSON 0.4 MG/5ML IV SOLN
0.4000 mg | Freq: Once | INTRAVENOUS | Status: AC
  Administered 2015-09-07: 0.4 mg via INTRAVENOUS

## 2015-09-07 MED ORDER — TECHNETIUM TC 99M TETROFOSMIN IV KIT
10.3000 | PACK | Freq: Once | INTRAVENOUS | Status: AC | PRN
  Administered 2015-09-07: 10 via INTRAVENOUS
  Filled 2015-09-07: qty 10

## 2015-09-11 ENCOUNTER — Other Ambulatory Visit: Payer: Self-pay | Admitting: Physician Assistant

## 2015-09-11 ENCOUNTER — Ambulatory Visit
Admission: RE | Admit: 2015-09-11 | Discharge: 2015-09-11 | Disposition: A | Discharge status: Home / Self Care | Payer: PRIVATE HEALTH INSURANCE | Source: Ambulatory Visit | Attending: Physician Assistant | Admitting: Physician Assistant

## 2015-09-11 DIAGNOSIS — Z780 Asymptomatic menopausal state: Secondary | ICD-10-CM

## 2015-09-11 DIAGNOSIS — E559 Vitamin D deficiency, unspecified: Secondary | ICD-10-CM

## 2015-09-12 ENCOUNTER — Encounter: Payer: Self-pay | Admitting: Physician Assistant

## 2015-09-12 DIAGNOSIS — M8580 Other specified disorders of bone density and structure, unspecified site: Secondary | ICD-10-CM | POA: Insufficient documentation

## 2015-09-16 ENCOUNTER — Other Ambulatory Visit: Payer: Self-pay | Admitting: Physician Assistant

## 2015-09-16 DIAGNOSIS — R9430 Abnormal result of cardiovascular function study, unspecified: Secondary | ICD-10-CM

## 2015-09-18 ENCOUNTER — Encounter: Payer: Self-pay | Admitting: Physician Assistant

## 2015-09-18 ENCOUNTER — Telehealth: Payer: Self-pay | Admitting: Physician Assistant

## 2015-09-18 ENCOUNTER — Telehealth: Payer: Self-pay | Admitting: *Deleted

## 2015-09-18 NOTE — Telephone Encounter (Signed)
Received fax from [place] requesting alternative drug for Atenolol, as it is on Back Order from [place]; called our Outpatient Pharmacy at [facility] and verified that they do have medication in stock. LMOM with contact name and number for return call RE: medication and further provider instructions [or change to Bystolic]/SLS [phone]

## 2015-09-18 NOTE — Telephone Encounter (Signed)
I called pt insurance and was informed that precert is not required for Echo however it must be completed within 30 days of in patient stay or 30 days of outpatient surgery. Please advise.

## 2015-09-19 ENCOUNTER — Other Ambulatory Visit: Payer: Self-pay | Admitting: *Deleted

## 2015-09-19 MED ORDER — ATENOLOL 100 MG PO TABS: 100.0000 mg | ORAL_TABLET | Freq: Every day | ORAL | 1 refills | Status: DC

## 2015-09-19 MED FILL — ATENOLOL 100 MG TABLET: 100 | 90 days supply | Qty: 90 | Fill #0

## 2015-09-19 NOTE — Telephone Encounter (Signed)
Can we proceed with precertification then since she has not recent hospitalization or surgery?

## 2015-09-19 NOTE — Telephone Encounter (Signed)
Called insurance again faxed request for precert/notification as discussed with provider. Notified pt that we are working on it and insurance has given [place] unexpected feedback and request has been faxed as urgent.

## 2015-09-19 NOTE — Telephone Encounter (Signed)
Insurance does not consider the procedure to be "outpatient surgery" so it is not covered. Please advise.

## 2015-09-19 NOTE — Telephone Encounter (Signed)
She just had an abnormal ejection fraction on a Lexiscan done with Cardiology last week. That should cover their requirements of outpatient procedure within 30 days.

## 2015-09-24 ENCOUNTER — Encounter: Payer: Self-pay | Admitting: Physician Assistant

## 2015-09-24 ENCOUNTER — Other Ambulatory Visit: Payer: Self-pay | Admitting: Physician Assistant

## 2015-09-24 MED ORDER — DULOXETINE HCL 30 MG PO CPEP: 30.0000 mg | ORAL_CAPSULE | Freq: Every day | ORAL | 5 refills | Status: DC

## 2015-10-08 ENCOUNTER — Other Ambulatory Visit: Payer: Self-pay | Admitting: Physician Assistant

## 2015-10-08 ENCOUNTER — Encounter: Payer: Self-pay | Admitting: Physician Assistant

## 2015-10-08 NOTE — Telephone Encounter (Signed)
We determined Josem Kaufmann was not required. I rerouted to [facility] today.

## 2015-10-08 NOTE — Telephone Encounter (Signed)
Shannon [name] -- did we ever get an answer? Patient sent Mychart message stating she has not heard anything. Please check on this and contact patient.

## 2015-10-09 NOTE — Telephone Encounter (Signed)
Called Cardiology to discuss with [facility] there as it has still not been scheduled. They had left for the day. LMOM for call back or to have them send me staff message to update me on what the hold up is.

## 2015-10-18 ENCOUNTER — Other Ambulatory Visit (HOSPITAL_COMMUNITY): Payer: PRIVATE HEALTH INSURANCE

## 2015-10-24 ENCOUNTER — Ambulatory Visit (HOSPITAL_COMMUNITY): Payer: No Typology Code available for payment source | Attending: Cardiology

## 2015-10-24 ENCOUNTER — Other Ambulatory Visit: Payer: Self-pay

## 2015-10-24 DIAGNOSIS — R079 Chest pain, unspecified: Secondary | ICD-10-CM | POA: Diagnosis not present

## 2015-10-24 DIAGNOSIS — R0989 Other specified symptoms and signs involving the circulatory and respiratory systems: Secondary | ICD-10-CM

## 2015-10-24 DIAGNOSIS — R9430 Abnormal result of cardiovascular function study, unspecified: Secondary | ICD-10-CM

## 2015-10-29 ENCOUNTER — Other Ambulatory Visit: Payer: Self-pay | Admitting: Physician Assistant

## 2015-10-29 DIAGNOSIS — I5189 Other ill-defined heart diseases: Secondary | ICD-10-CM

## 2015-10-31 NOTE — Progress Notes (Signed)
Cardiology Office Note    Date:  11/02/2015   ID:  Shannon [name], DOB Dec 14, 1950, MRN [id number]  PCP:  Leeanne Rio, PA-C  Cardiologist:  [facility] requests Dr. Johnsie Cancel  CC: Diastolic dysfunction   History of Present Illness:  Shannon [name] is a 65 y.o. female with a history of HTN, GERD, OSA and depression who presents to clinic for cardiac evaluation after a 2D ECHO showed G2DD (she requested a specialist).  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 She recently had a nuclear stress test for atypical chest pain/jaw pain which showed no ischemia but EF 48%. A follow up echo was performed which showed normal LV function with G2DD and mild LAE.  Today she presents for cardiology evaluation. When she was taken off her HCTZ she had some LE swelling but none recently. Only after long car rides. No orthopnea or PND. No dizziness or syncope. She has had some left sided chest pain that she thinks is related to her shoulder. She does have SOB. 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 Not very active due to this chronic pain she has had from the lupus. Chest pain/sob not exertional. She wants to start exercising more. No blood in stool or urine.     Past Medical History:  Diagnosis Date  . Anxiety   . Arthritis    neck and back  . Bronchitis   . Degenerative disorder of bone   . Depression   . GERD (gastroesophageal reflux disease)   . Herniated disc, cervical   . Hypertension   . Obstructive sleep apnea   . Osteopenia   . Pinched nerve in neck   . Sleep apnea   . Vitamin D deficiency   . Whiplash injury 06/07/2010    Past Surgical History:  Procedure Laterality Date  . APPENDECTOMY    . arm surgery Left     Current Medications: Outpatient Medications Prior to Visit  Medication Sig Dispense Refill  . albuterol (PROVENTIL HFA;VENTOLIN HFA) 108  (90 BASE) MCG/ACT inhaler Inhale 1 puff into the lungs every 4 (four) hours as needed for wheezing or shortness of breath.    . ALPRAZolam (XANAX) 0.5 MG tablet Take 0.5 mg by mouth 3 (three) times daily as needed for anxiety.    Marland Kitchen amLODipine (NORVASC) 10 MG tablet Take 1 tablet (10 mg total) by mouth daily. 90 tablet 1  . atenolol (TENORMIN) 100 MG tablet Take 1 tablet (100 mg total) by mouth daily. 90 tablet 1  . Calcium Carbonate-Vitamin D (CALCIUM 600+D) 600-200 MG-UNIT TABS Take 1 tablet by mouth daily.    . DULoxetine (CYMBALTA) 30 MG capsule Take 1 capsule (30 mg total) by mouth daily. 30 capsule 5  . ergocalciferol (VITAMIN D2) 50000 units capsule Take 1 capsule (50,000 Units total) by mouth every 30 (thirty) days. 4 capsule 3  . meclizine (ANTIVERT) 25 MG tablet Take 1 tablet (25 mg total) by mouth 3 (three) times daily as needed for dizziness. 30 tablet 0  . sertraline (ZOLOFT) 100 MG tablet Take 1 tablet (100 mg total) by mouth daily. 30 tablet 3  . valsartan (DIOVAN) 320 MG tablet TAKE ONE TABLET BY MOUTH ONCE DAILY 30 tablet 5  . predniSONE (DELTASONE) 5 MG tablet Take 2 tablets (10 mg total) by mouth daily with breakfast. 60 tablet 1   No facility-administered  medications prior to visit.      Allergies:   Ace inhibitors; Oxycodone; Sulfur; and Voltaren [diclofenac sodium]   Social History   Social History  . Marital status: Married    Spouse name: N/A  . Number of children: N/A  . Years of education: N/A   Social History Main Topics  . Smoking status: Former Smoker    Quit date: 09/06/2013  . Smokeless tobacco: Never Used     Comment: Pt started to use vaporizer-uses rarely  . Alcohol use 0.0 oz/week     Comment: Pt drinks wine 1-2 per month  . Drug use: No  . Sexual activity: Not Asked   Other Topics Concern  . None   Social History Narrative  . None     Family History:  The patient's family history includes Cancer in her mother; Hypertension in her father;  Stroke in her father.     ROS:   Please see the history of present illness.    ROS All other systems reviewed and are negative.   PHYSICAL EXAM:   VS:  BP 116/60 (BP Location: Right Arm)   Pulse 69   Ht 5\' 7"  (1.702 m)   Wt 221 lb 12.8 oz (100.6 kg)   BMI 34.74 kg/m    GEN: Well nourished, well developed, in no acute distress  HEENT: normal  Neck: no JVD, carotid bruits, or masses Cardiac: RRR; no murmurs, rubs, or gallops,no edema  Respiratory:  clear to auscultation bilaterally, normal work of breathing GI: soft, nontender, nondistended, + BS MS: no deformity or atrophy  Skin: warm and dry, no rash Neuro:  Alert and Oriented x 3, Strength and sensation are intact Psych: euthymic mood, full affect  Wt Readings from Last 3 Encounters:  11/02/15 221 lb 12.8 oz (100.6 kg)  08/28/15 222 lb 6 oz (100.9 kg)  08/14/15 221 lb 8 oz (100.5 kg)      Studies/Labs Reviewed:   EKG:  EKG is NOT ordered today.  Recent Labs: 06/01/2015: TSH 0.60   Lipid Panel No results found for: CHOL, TRIG, HDL, CHOLHDL, VLDL, LDLCALC, LDLDIRECT  Additional studies/ records that were reviewed today include:  2D ECHO: 10/24/2015 LV EF: 55% -   60% Study Conclusions - Left ventricle: The cavity size was normal. Wall thickness was   increased in a pattern of mild LVH. Systolic function was normal.   The estimated ejection fraction was in the range of 55% to 60%.   Wall motion was normal; there were no regional wall motion   abnormalities. Features are consistent with a pseudonormal left   ventricular filling pattern, with concomitant abnormal relaxation   and increased filling pressure (grade 2 diastolic dysfunction). - Left atrium: The atrium was moderately dilated. Impressions: - Normal LV systolic function; grade 2 diastolic dysfunction; mild   LAE.   Myoview 09/07/15 Study Highlights    Nuclear stress EF: 48%.  There was no ST segment deviation noted during stress.  The study is  normal.  The left ventricular ejection fraction is mildly decreased (45-54%).   Normal resting and stress perfusion. No ischemia or infarction EF 48% mild diffuse hypokinesis suggest echo or MRI correlation       ASSESSMENT & PLAN:   Diastolic dysfunction: asymptomatic with no clinical CHF. Focus is on BP control and continue CPAP. I have encouraged her to start a graded exercise program. I have discussed s/s CHF and she will call us if she has any issues. I think  she can follow up on a PRN basis as she has an excellent PCP that she has a good relationship with.   Chest pain/SOB: atypical. Recent low risk nuclear stress test.   HTN: BP well controlled 126/74 today.  OSA: complaint with CPAP.   Medication Adjustments/Labs and Tests Ordered: Current medicines are reviewed at length with the patient today.  Concerns regarding medicines are outlined above.  Medication changes, Labs and Tests ordered today are listed in the Patient Instructions below. Patient Instructions  Medication Instructions:  Your physician recommends that you continue on your current medications as directed. Please refer to the Current Medication list given to you today.   Labwork: None ordered  Testing/Procedures: None ordered  Follow-Up: Your physician recommends that you schedule a follow-up appointment in: PRN\    Any Other Special Instructions Will Be Listed Below (If Applicable).     If you need a refill on your cardiac medications before your next appointment, please call your pharmacy.      Signed, Angelena Form, PA-C  11/02/2015 11:57 AM    [facility] [address], [place], [place]  [address] Phone: [phone]; Fax: [phone]

## 2015-11-01 ENCOUNTER — Encounter: Payer: Self-pay | Admitting: Physician Assistant

## 2015-11-02 ENCOUNTER — Encounter: Payer: Self-pay | Admitting: Physician Assistant

## 2015-11-02 ENCOUNTER — Ambulatory Visit (INDEPENDENT_AMBULATORY_CARE_PROVIDER_SITE_OTHER): Payer: PRIVATE HEALTH INSURANCE | Admitting: Physician Assistant

## 2015-11-02 VITALS — BP 116/60 | HR 69 | Ht 67.0 in | Wt 221.8 lb

## 2015-11-02 DIAGNOSIS — Z9989 Dependence on other enabling machines and devices: Secondary | ICD-10-CM

## 2015-11-02 DIAGNOSIS — G4733 Obstructive sleep apnea (adult) (pediatric): Secondary | ICD-10-CM | POA: Diagnosis not present

## 2015-11-02 DIAGNOSIS — I10 Essential (primary) hypertension: Secondary | ICD-10-CM | POA: Diagnosis not present

## 2015-11-02 DIAGNOSIS — I519 Heart disease, unspecified: Secondary | ICD-10-CM

## 2015-11-02 DIAGNOSIS — I5189 Other ill-defined heart diseases: Secondary | ICD-10-CM

## 2015-11-02 NOTE — Patient Instructions (Signed)
Medication Instructions:  Your physician recommends that you continue on your current medications as directed. Please refer to the Current Medication list given to you today.   Labwork: None ordered  Testing/Procedures: None ordered  Follow-Up: Your physician recommends that you schedule a follow-up appointment in: PRN\    Any Other Special Instructions Will Be Listed Below (If Applicable).     If you need a refill on your cardiac medications before your next appointment, please call your pharmacy.

## 2015-11-05 ENCOUNTER — Other Ambulatory Visit: Payer: Self-pay | Admitting: Physician Assistant

## 2015-11-07 DIAGNOSIS — S22080A Wedge compression fracture of T11-T12 vertebra, initial encounter for closed fracture: Secondary | ICD-10-CM

## 2015-11-07 HISTORY — DX: Wedge compression fracture of T11-T12 vertebra, initial encounter for closed fracture: S22.080A

## 2015-11-15 ENCOUNTER — Inpatient Hospital Stay (HOSPITAL_COMMUNITY)
Admission: EM | Admit: 2015-11-15 | Discharge: 2015-11-19 | DRG: 312 | Disposition: A | Discharge status: Home Health | Payer: PRIVATE HEALTH INSURANCE | Attending: Internal Medicine | Admitting: Internal Medicine

## 2015-11-15 ENCOUNTER — Emergency Department (HOSPITAL_COMMUNITY): Payer: PRIVATE HEALTH INSURANCE

## 2015-11-15 DIAGNOSIS — W010XXA Fall on same level from slipping, tripping and stumbling without subsequent striking against object, initial encounter: Secondary | ICD-10-CM | POA: Diagnosis present

## 2015-11-15 DIAGNOSIS — I493 Ventricular premature depolarization: Secondary | ICD-10-CM | POA: Diagnosis present

## 2015-11-15 DIAGNOSIS — K219 Gastro-esophageal reflux disease without esophagitis: Secondary | ICD-10-CM | POA: Diagnosis present

## 2015-11-15 DIAGNOSIS — S22000S Wedge compression fracture of unspecified thoracic vertebra, sequela: Secondary | ICD-10-CM | POA: Diagnosis not present

## 2015-11-15 DIAGNOSIS — M8580 Other specified disorders of bone density and structure, unspecified site: Secondary | ICD-10-CM | POA: Diagnosis present

## 2015-11-15 DIAGNOSIS — I4891 Unspecified atrial fibrillation: Secondary | ICD-10-CM

## 2015-11-15 DIAGNOSIS — F329 Major depressive disorder, single episode, unspecified: Secondary | ICD-10-CM | POA: Diagnosis present

## 2015-11-15 DIAGNOSIS — G8929 Other chronic pain: Secondary | ICD-10-CM | POA: Diagnosis present

## 2015-11-15 DIAGNOSIS — S22000A Wedge compression fracture of unspecified thoracic vertebra, initial encounter for closed fracture: Secondary | ICD-10-CM | POA: Diagnosis present

## 2015-11-15 DIAGNOSIS — W19XXXA Unspecified fall, initial encounter: Secondary | ICD-10-CM | POA: Diagnosis present

## 2015-11-15 DIAGNOSIS — G4733 Obstructive sleep apnea (adult) (pediatric): Secondary | ICD-10-CM | POA: Diagnosis present

## 2015-11-15 DIAGNOSIS — I951 Orthostatic hypotension: Secondary | ICD-10-CM | POA: Diagnosis not present

## 2015-11-15 DIAGNOSIS — Z87891 Personal history of nicotine dependence: Secondary | ICD-10-CM

## 2015-11-15 DIAGNOSIS — E785 Hyperlipidemia, unspecified: Secondary | ICD-10-CM | POA: Diagnosis present

## 2015-11-15 DIAGNOSIS — S22089A Unspecified fracture of T11-T12 vertebra, initial encounter for closed fracture: Secondary | ICD-10-CM | POA: Diagnosis present

## 2015-11-15 DIAGNOSIS — F32A Depression, unspecified: Secondary | ICD-10-CM | POA: Diagnosis present

## 2015-11-15 DIAGNOSIS — F411 Generalized anxiety disorder: Secondary | ICD-10-CM | POA: Diagnosis present

## 2015-11-15 DIAGNOSIS — Z8249 Family history of ischemic heart disease and other diseases of the circulatory system: Secondary | ICD-10-CM

## 2015-11-15 DIAGNOSIS — M545 Low back pain: Secondary | ICD-10-CM | POA: Diagnosis not present

## 2015-11-15 DIAGNOSIS — I10 Essential (primary) hypertension: Secondary | ICD-10-CM | POA: Diagnosis present

## 2015-11-15 DIAGNOSIS — W19XXXS Unspecified fall, sequela: Secondary | ICD-10-CM | POA: Diagnosis not present

## 2015-11-15 DIAGNOSIS — Y92009 Unspecified place in unspecified non-institutional (private) residence as the place of occurrence of the external cause: Secondary | ICD-10-CM

## 2015-11-15 DIAGNOSIS — S22080A Wedge compression fracture of T11-T12 vertebra, initial encounter for closed fracture: Secondary | ICD-10-CM

## 2015-11-15 HISTORY — DX: Heart failure, unspecified: I50.9

## 2015-11-15 HISTORY — DX: Myoneural disorder, unspecified: G70.9

## 2015-11-15 HISTORY — DX: Hyperlipidemia, unspecified: E78.5

## 2015-11-15 LAB — BASIC METABOLIC PANEL
Anion gap: 10 (ref 5–15)
BUN: 10 mg/dL (ref 6–20)
CHLORIDE: 105 mmol/L (ref 101–111)
CO2: 24 mmol/L (ref 22–32)
Calcium: 9.1 mg/dL (ref 8.9–10.3)
Creatinine, Ser: 0.87 mg/dL (ref 0.44–1.00)
GFR calc non Af Amer: >60 mL/min (ref >60)
Glucose, Bld: 126 mg/dL — ABNORMAL HIGH (ref 65–99)
POTASSIUM: 4.3 mmol/L (ref 3.5–5.1)
SODIUM: 139 mmol/L (ref 135–145)

## 2015-11-15 LAB — CBC
HEMATOCRIT: 36.8 % (ref 36.0–46.0)
Hemoglobin: 12 g/dL (ref 12.0–15.0)
MCH: 29.6 pg (ref 26.0–34.0)
MCHC: 32.6 g/dL (ref 30.0–36.0)
MCV: 90.9 fL (ref 78.0–100.0)
Platelets: 334 10*3/uL (ref 150–400)
RBC: 4.05 MIL/uL (ref 3.87–5.11)
RDW: 13.5 % (ref 11.5–15.5)
WBC: 13.8 10*3/uL — AB (ref 4.0–10.5)

## 2015-11-15 LAB — URINE MICROSCOPIC-ADD ON

## 2015-11-15 LAB — I-STAT CHEM 8, ED
BUN: 11 mg/dL (ref 6–20)
CHLORIDE: 104 mmol/L (ref 101–111)
Calcium, Ion: 1.19 mmol/L (ref 1.15–1.40)
Creatinine, Ser: 0.9 mg/dL (ref 0.44–1.00)
Glucose, Bld: 123 mg/dL — ABNORMAL HIGH (ref 65–99)
HEMATOCRIT: 36 % (ref 36.0–46.0)
Hemoglobin: 12.2 g/dL (ref 12.0–15.0)
Potassium: 4.3 mmol/L (ref 3.5–5.1)
SODIUM: 141 mmol/L (ref 135–145)
TCO2: 27 mmol/L (ref 0–100)

## 2015-11-15 LAB — PREPARE FRESH FROZEN PLASMA
UNIT DIVISION: 0
Unit division: 0

## 2015-11-15 LAB — I-STAT TROPONIN, ED: Troponin i, poc: 0.03 ng/mL (ref 0.00–0.08)

## 2015-11-15 LAB — URINALYSIS, ROUTINE W REFLEX MICROSCOPIC
GLUCOSE, UA: NEGATIVE mg/dL
HGB URINE DIPSTICK: NEGATIVE
KETONES UR: 15 mg/dL — AB
LEUKOCYTES UA: NEGATIVE
Nitrite: NEGATIVE
PH: 5.5 (ref 5.0–8.0)
Protein, ur: 30 mg/dL — AB
Specific Gravity, Urine: 1.024 (ref 1.005–1.030)

## 2015-11-15 IMAGING — DX DG LUMBAR SPINE 2-3V
3 series · 3 of 3 positions shown · non-contrast
Comparison: None.

CLINICAL DATA: Fell today with pain in the back

EXAM:
LUMBAR SPINE - 2-3 VIEW

[t lumbar spine ap]
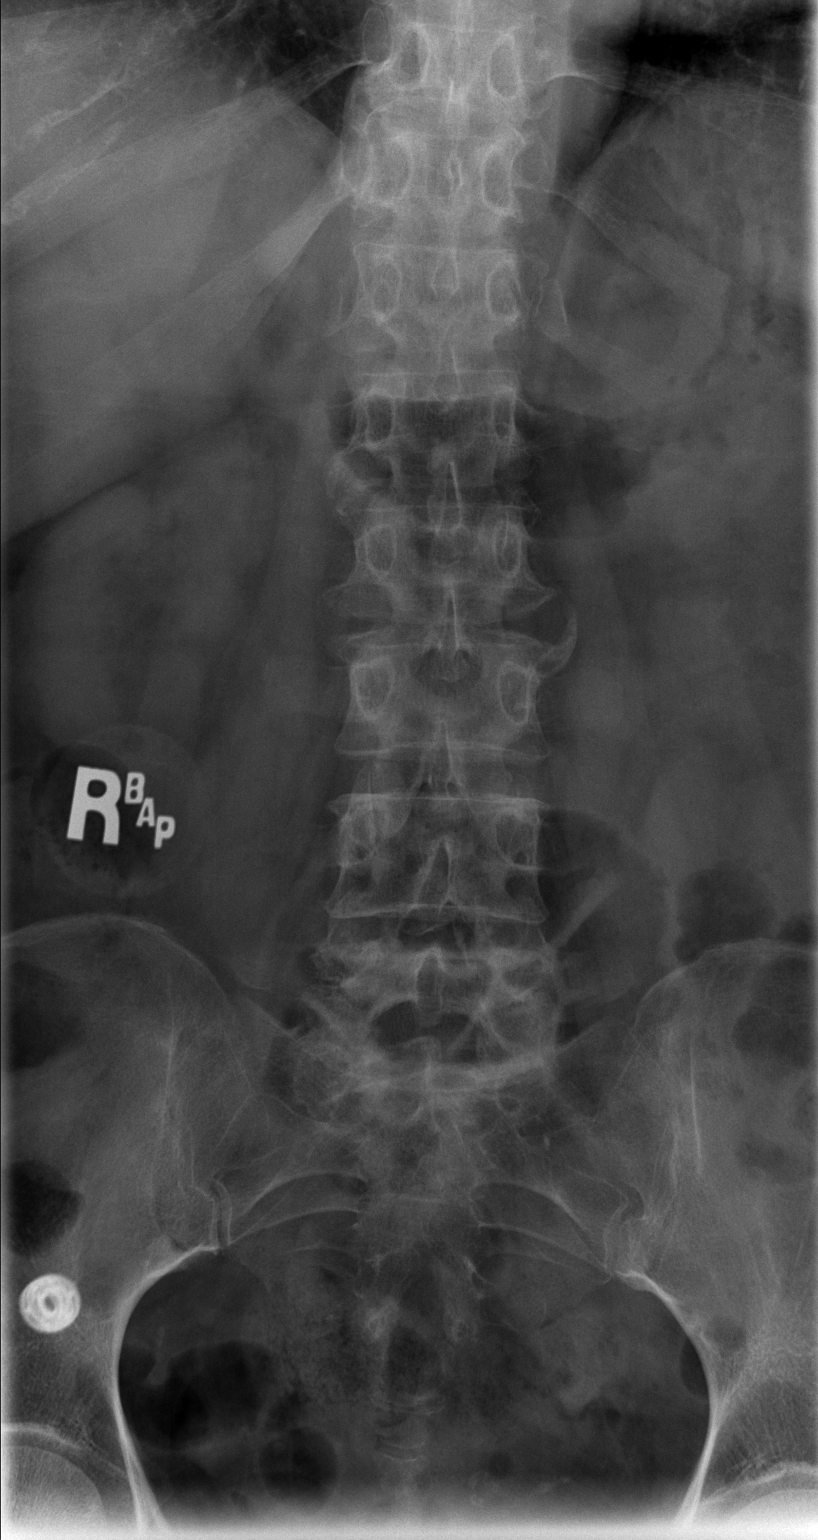

[t lumbar spine lat]
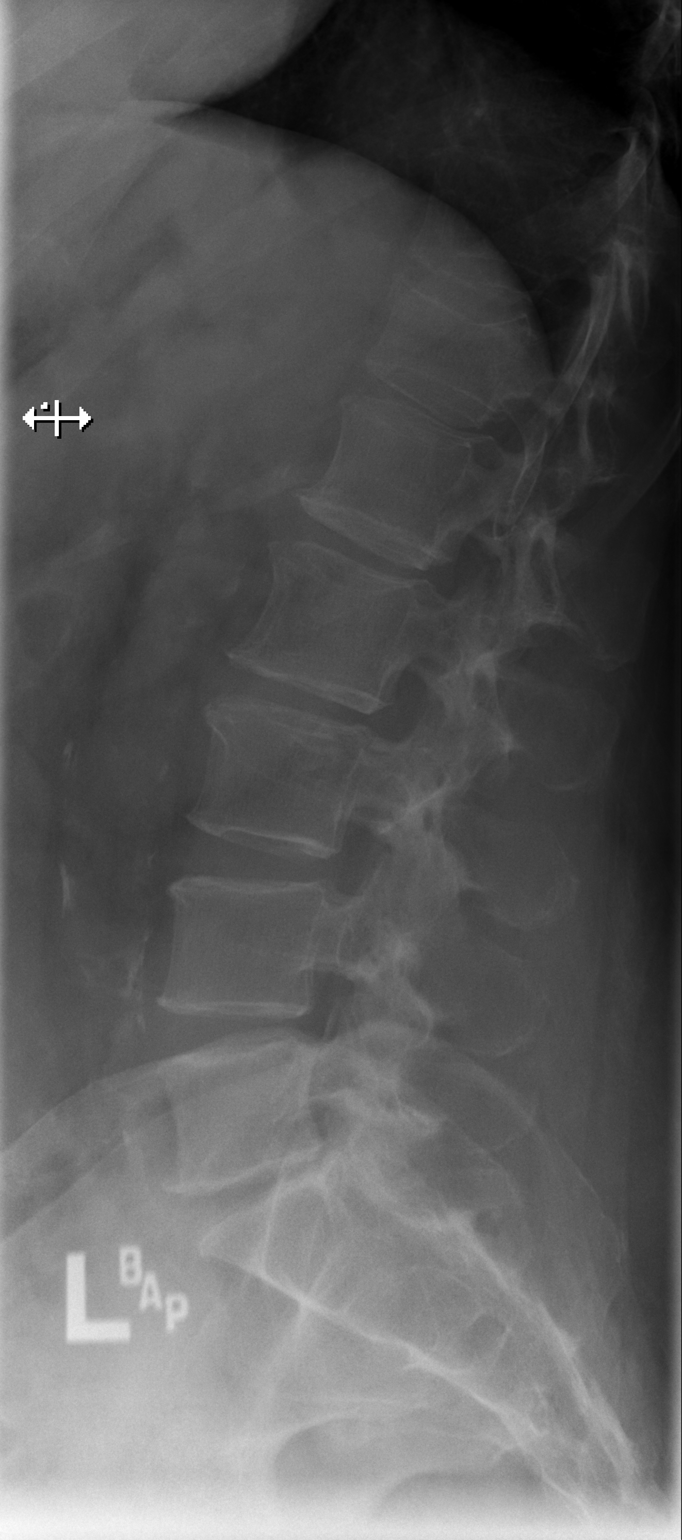

[t lumbar l-5 s-1 spot]
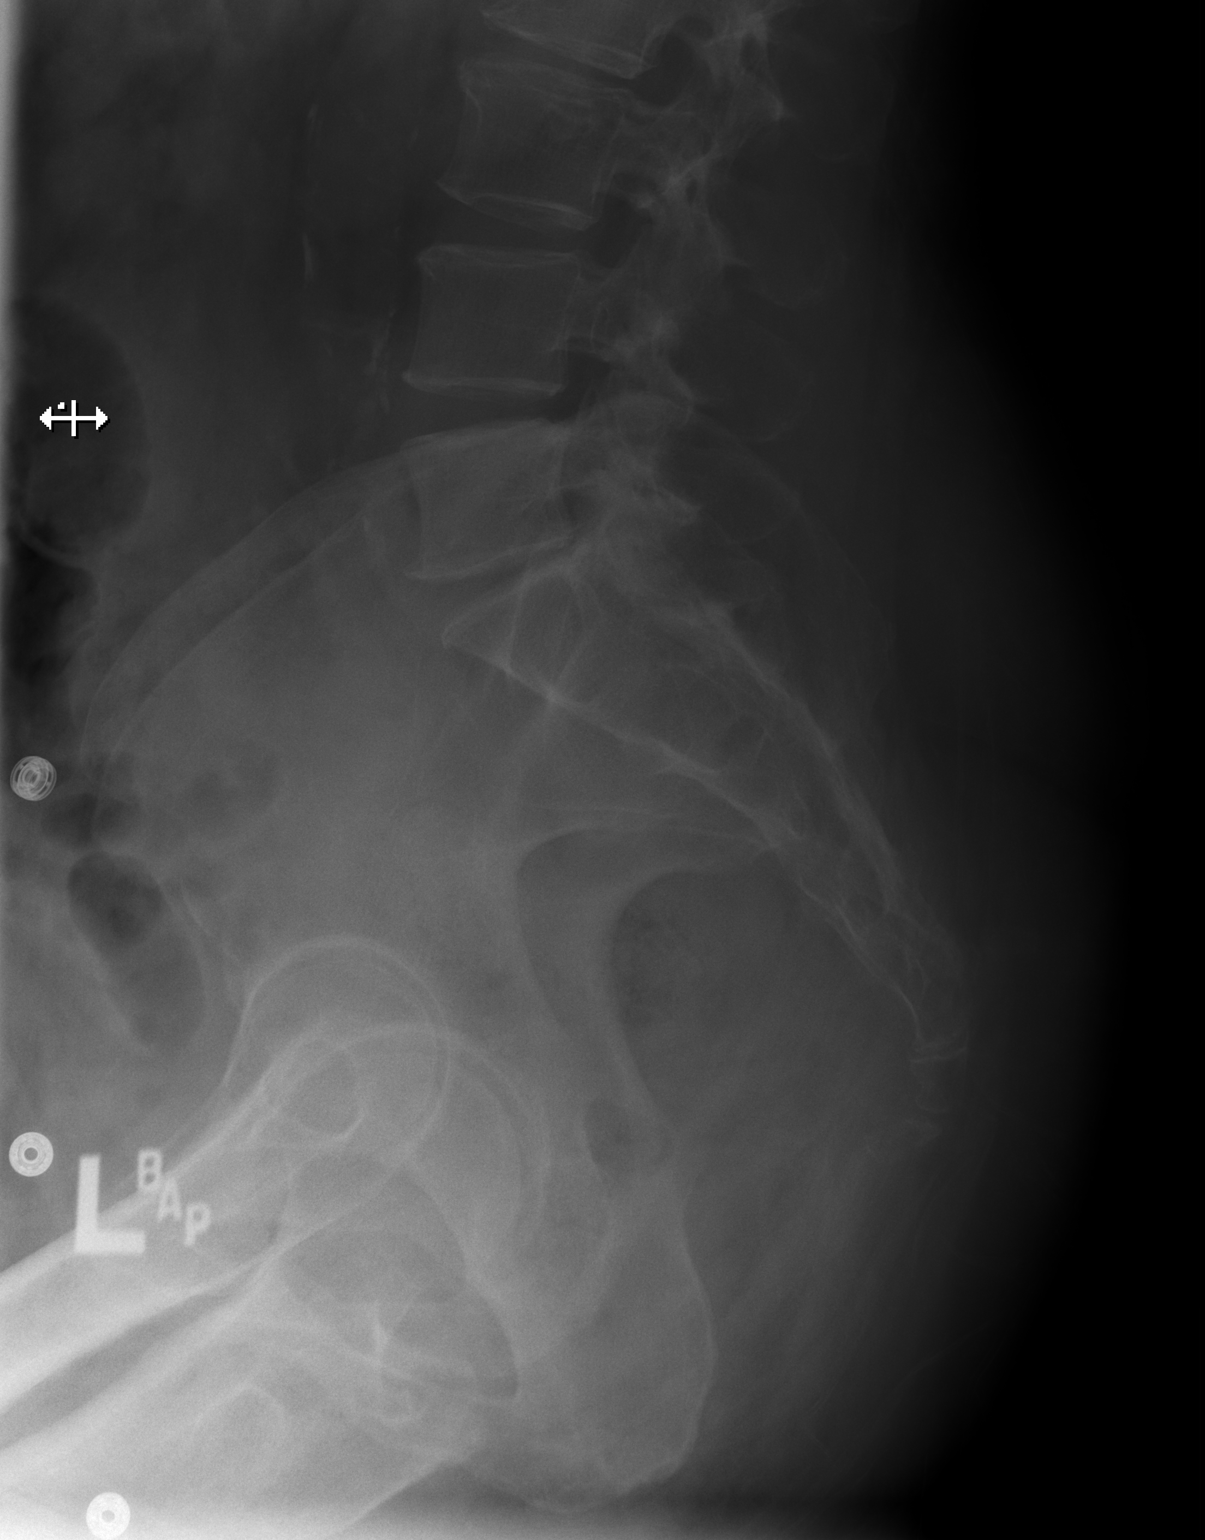

[3 of 3 positions shown; findings below may reference images not displayed]

FINDINGS: The lumbar vertebrae are in normal alignment. Intervertebral disc
spaces appear normal with the exception of degenerative disc disease
at L5-S1. However, there does appear to be a partial compression
deformity of the anterior superior aspect of T12 vertebral body most
likely acute. No retropulsion is seen. CT or MRI could be useful for
further evaluation if indicated.
IMPRESSION: 1. Suspect acute compression fracture of the anterior superior
aspect of T12 vertebral body. Consider CT or MRI if further
assessment is warranted.
2. Degenerative disc disease at L5-S1

## 2015-11-15 IMAGING — DX DG PELVIS 1-2V
2 series · 2 of 2 positions shown · non-contrast
Comparison: None.

CLINICAL DATA: Fell today with pain in the low back

EXAM:
PELVIS - 1-2 VIEW

[t pelvis ap (1 of 2)]
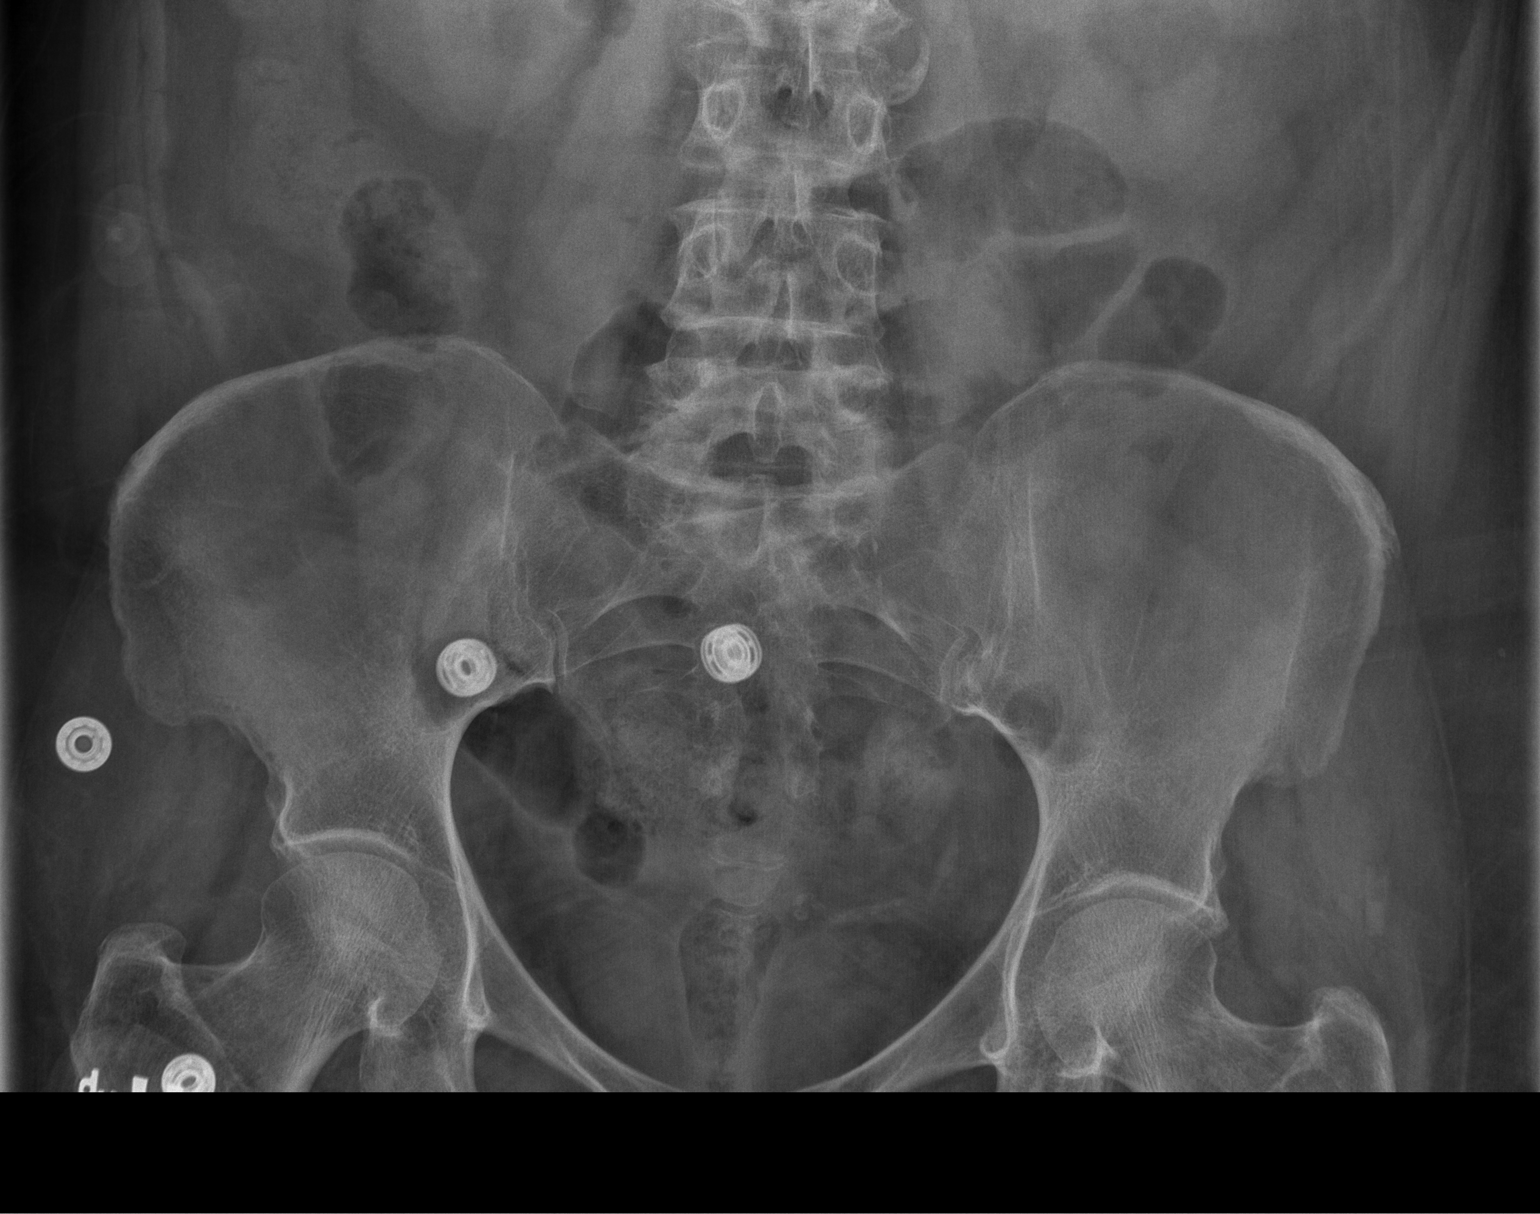

[t pelvis ap (2 of 2)]
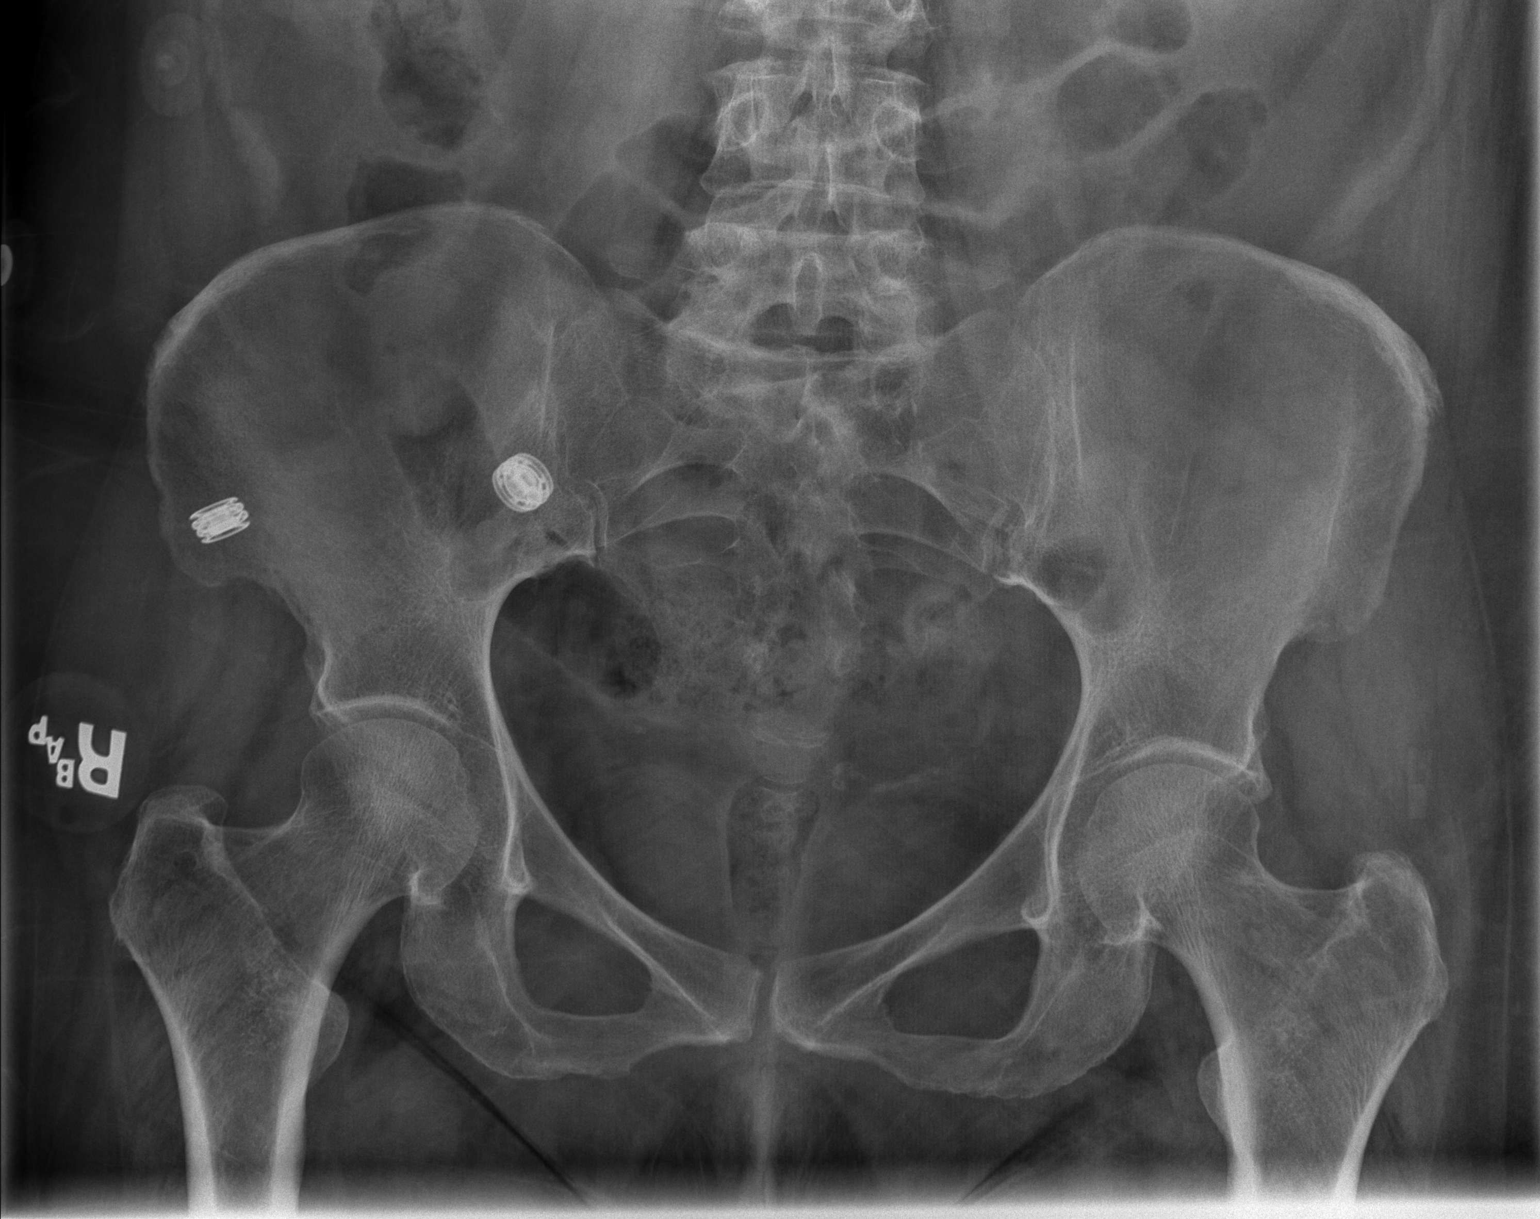

[2 of 2 positions shown; findings below may reference images not displayed]

FINDINGS: Both hips are normal position with normal hip joint spaces. The
pelvic rami are intact. The SI joints are corticated. The sacral
foramina appear normal. No acute fracture is seen.
IMPRESSION: Negative.

## 2015-11-15 IMAGING — CT CT T SPINE W/O CM
3 of 7 series · 5 of 33 positions shown, 6 images · non-contrast
Comparison: Chest and lumbar spine radiographs earlier today

CLINICAL DATA: Fall today. Back pain. T12 compression fracture on
radiographs. Initial encounter.

EXAM:
CT THORACIC SPINE WITHOUT CONTRAST
TECHNIQUE: Multidetector CT imaging of the thoracic spine was performed without
intravenous contrast administration. Multiplanar CT image
reconstructions were also generated.

[Series 3010: orthog upper bone · axial · 0.38mm/px · z∈[+65,+132]mm · 2 of 115 slices shown]
[im 39/115  bone]
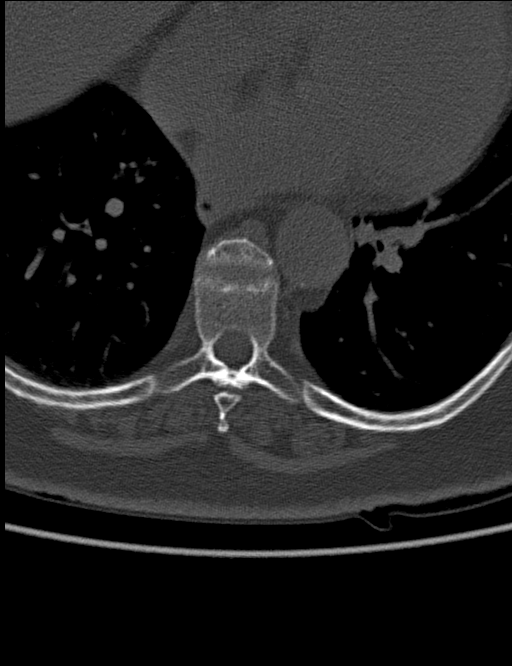
[im 77/115  bone]
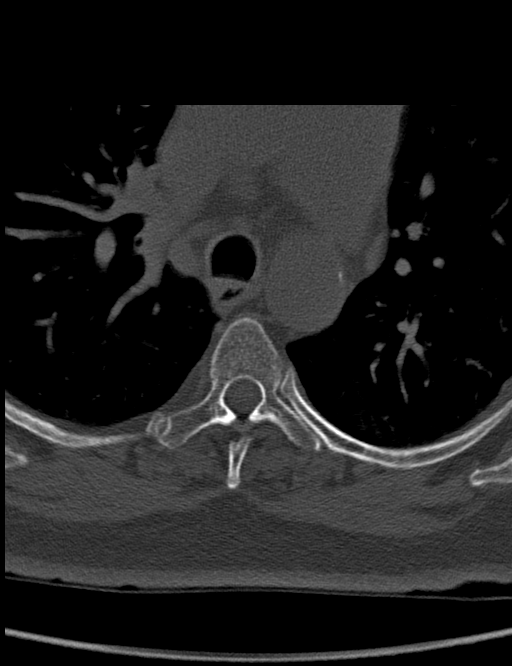

[Series 3011: orthog lower bone · axial · 0.38mm/px · z∈[+40,+129]mm · 2 of 141 slices shown, 3 images]
[im 47/141  soft-tissue]
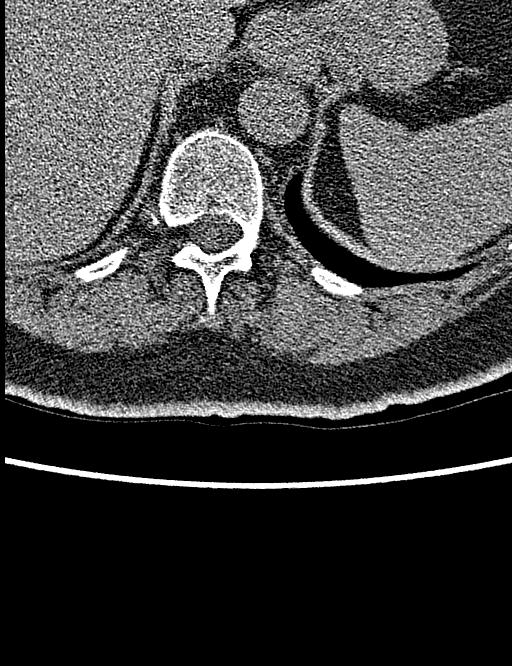
[im 47/141  bone]
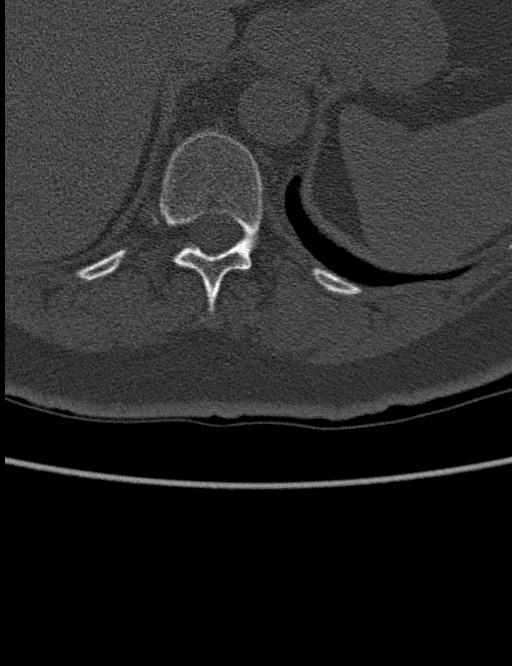
[im 94/141  bone]
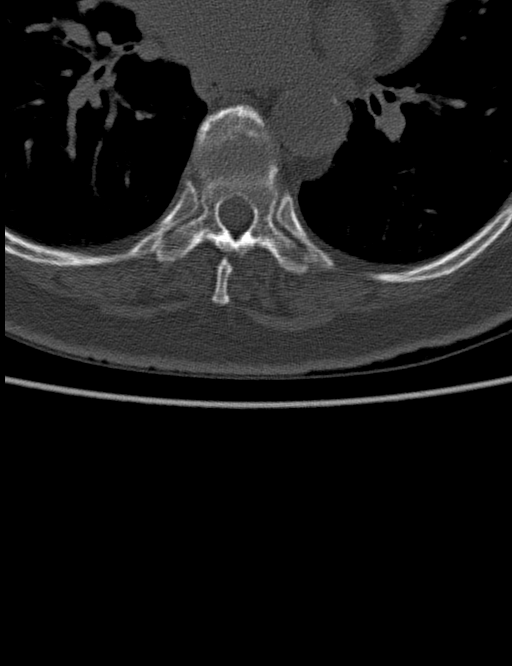

[Series 3012: coronal upper bone · coronal · 0.38mm/px · 1 of 63 slices shown]
[im 3/63  bone]
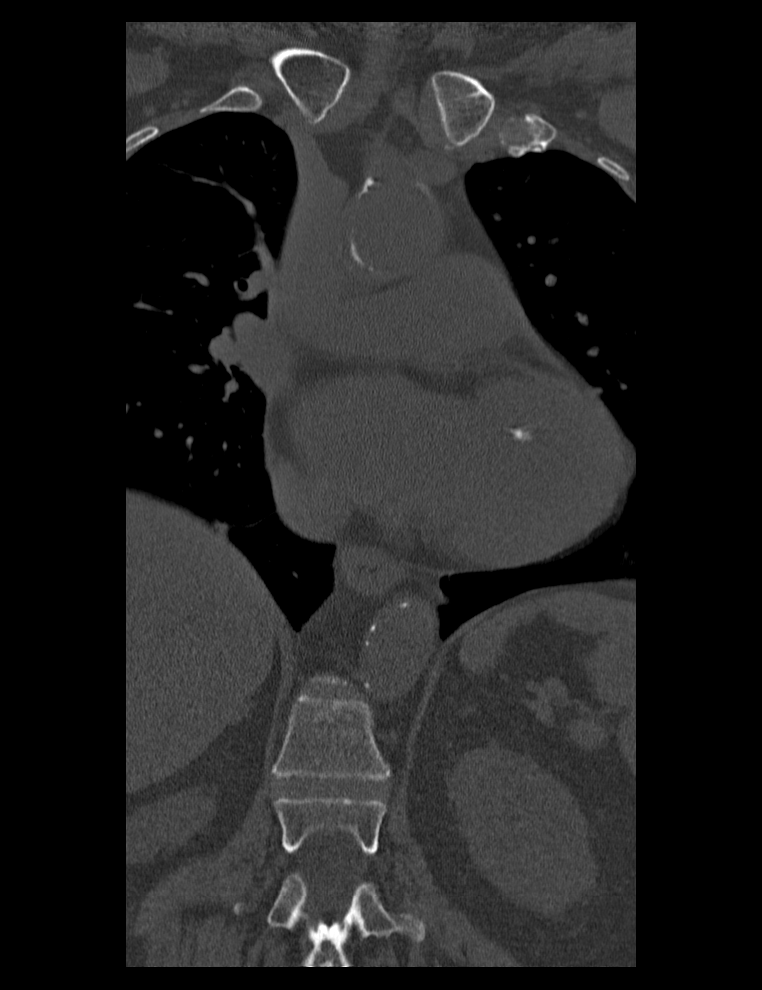

[5 of 33 positions shown; findings below may reference images not displayed]

FINDINGS: Alignment: No subluxation.

Vertebrae: As seen on earlier radiographs, there is a fracture of
T12 involving the superior endplate and anterior vertebral body with
minimal vertebral body height loss predominantly on the left. There
is no retropulsion or posterior element fracture identified. No
additional thoracic spine fracture is identified.

Paraspinal and other soft tissues: Aortic atherosclerosis without
aneurysm. Small sliding hiatal hernia. Coronary artery
atherosclerosis.

Disc levels: Mild thoracic spondylosis. No evidence of significant
spinal stenosis on this non myelographic examination. Mild to
moderate left neural foraminal stenosis at T7-8 due to facet
arthrosis.
IMPRESSION: 1. Acute appearing T12 compression fracture with minimal height
loss.
2. Aortic atherosclerosis.

## 2015-11-15 IMAGING — DX DG CHEST 2V
2 series · 2 of 2 positions shown · non-contrast
Comparison: None.

CLINICAL DATA: Fell today with pain in the lower back

EXAM:
CHEST  2 VIEW

[w chest lat]
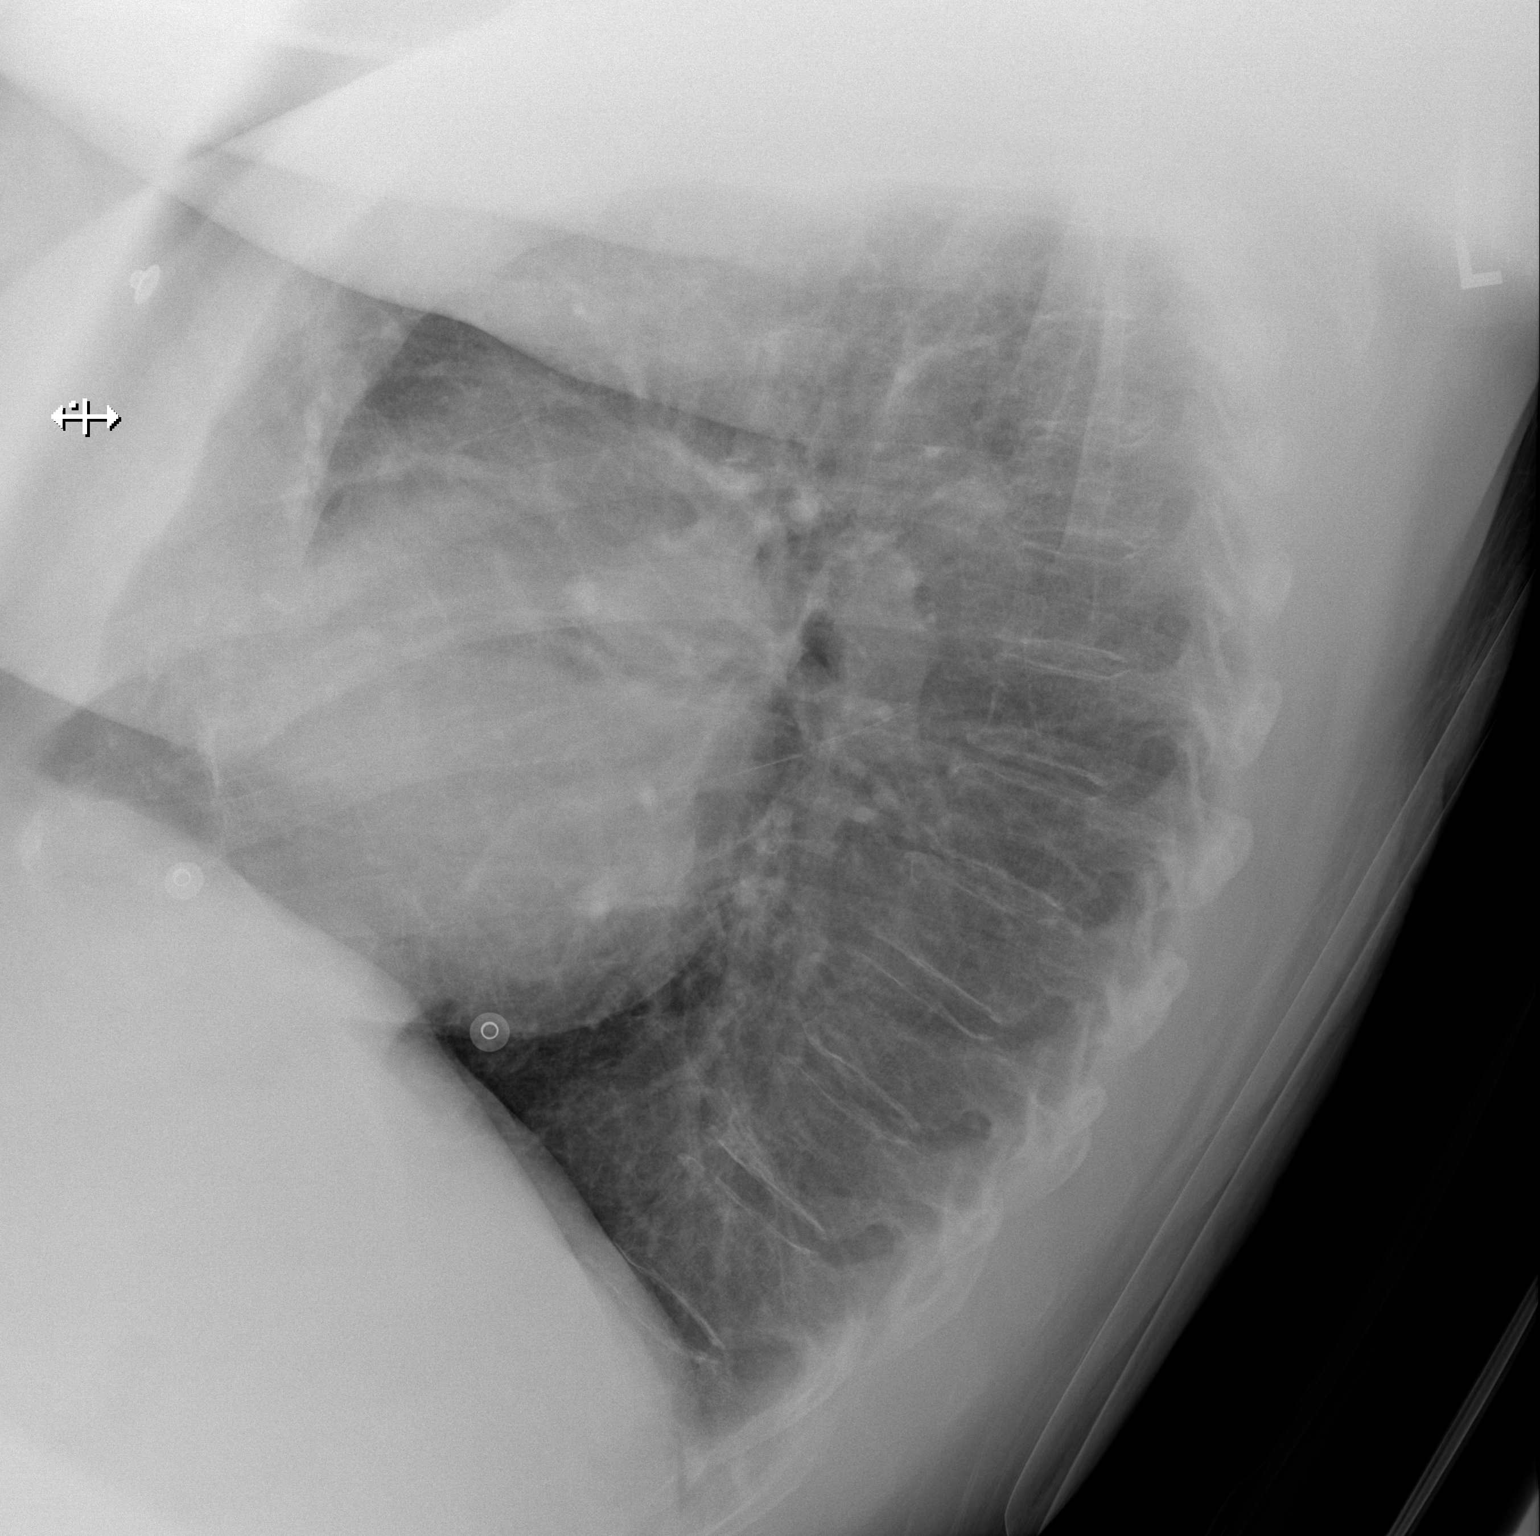

[x chest ap]
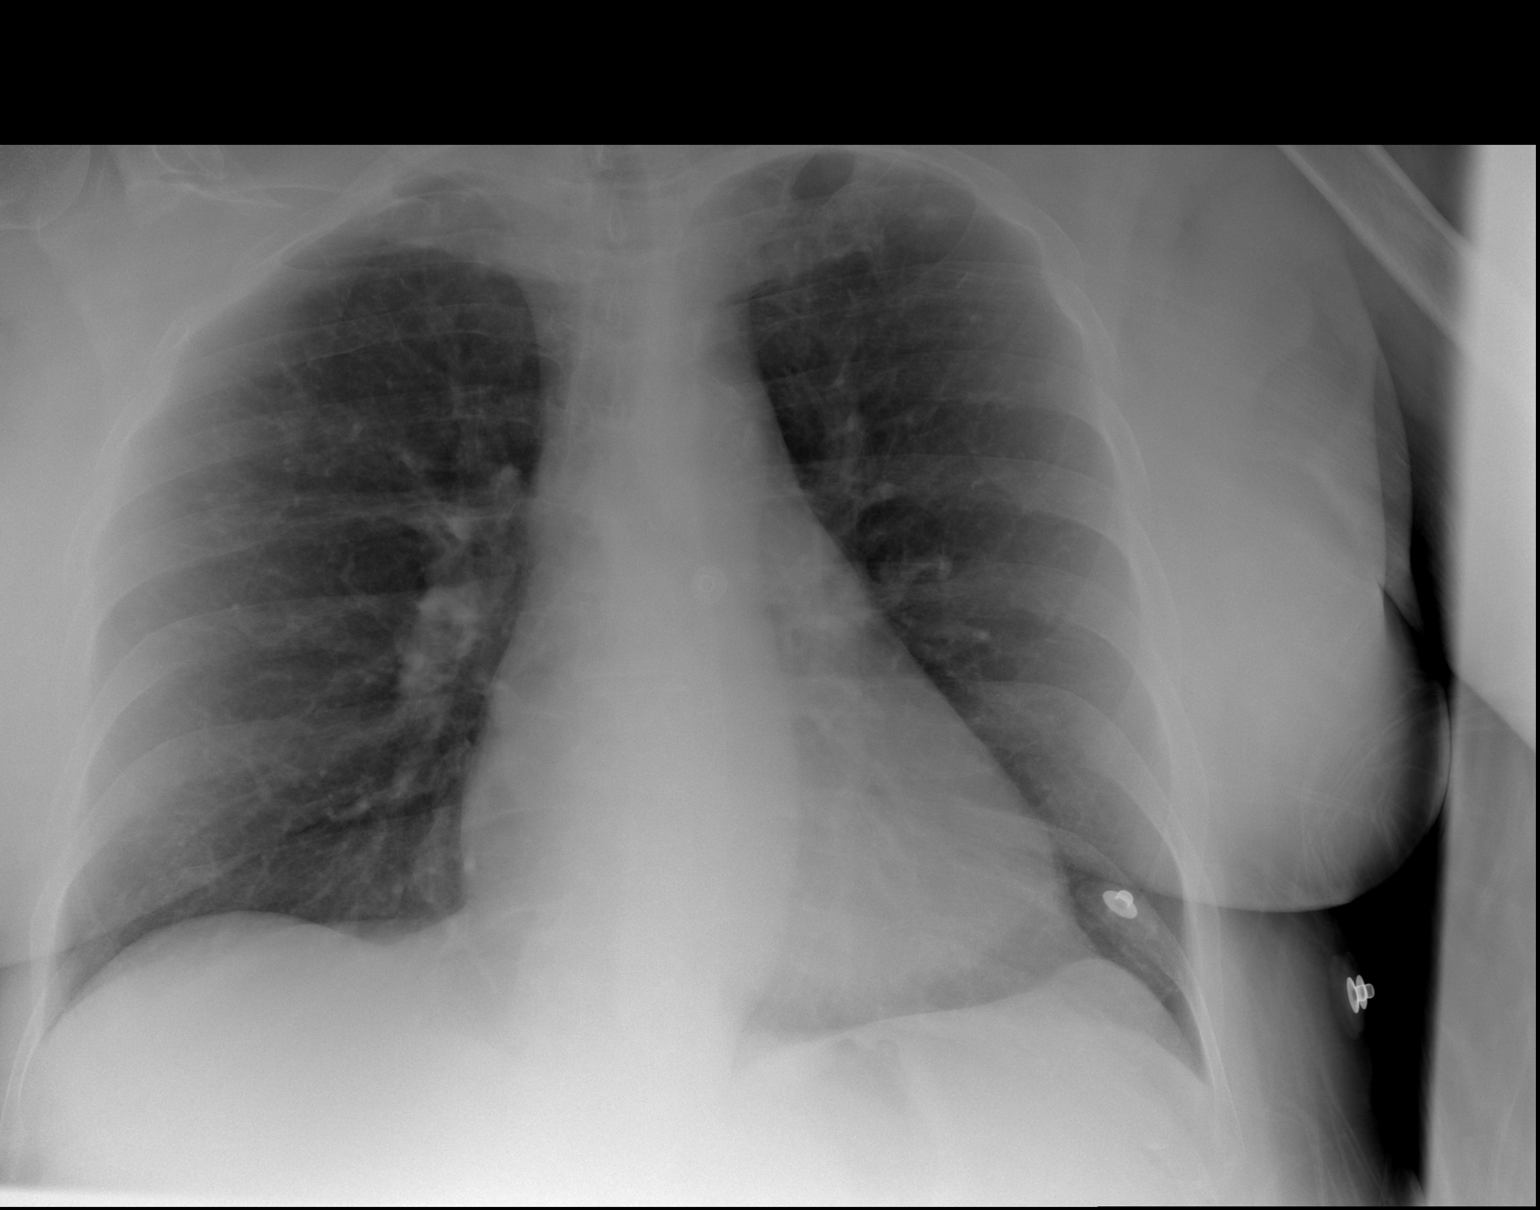

[2 of 2 positions shown; findings below may reference images not displayed]

FINDINGS: No active infiltrate or effusion is seen. Mediastinal and hilar
contours are unremarkable. The heart is within upper limits normal.
No acute bony abnormality is seen.
IMPRESSION: No active lung disease. No fracture is seen. The heart is upper
normal in size.

## 2015-11-15 MED ORDER — FENTANYL CITRATE (PF) 100 MCG/2ML IJ SOLN
50.0000 ug | Freq: Once | INTRAMUSCULAR | Status: AC
  Administered 2015-11-15: 50 ug via INTRAVENOUS
  Filled 2015-11-15: qty 2

## 2015-11-15 MED ORDER — SODIUM CHLORIDE 0.9 % IV SOLN
Freq: Once | INTRAVENOUS | Status: AC
  Administered 2015-11-15: 999 mL/h via INTRAVENOUS

## 2015-11-15 MED ORDER — SODIUM CHLORIDE 0.9 % IV BOLUS (SEPSIS)
500.0000 mL | Freq: Once | INTRAVENOUS | Status: AC
  Administered 2015-11-15: 500 mL via INTRAVENOUS

## 2015-11-15 MED ORDER — KETOROLAC TROMETHAMINE 30 MG/ML IJ SOLN
30.0000 mg | Freq: Once | INTRAMUSCULAR | Status: AC
  Administered 2015-11-15: 30 mg via INTRAVENOUS
  Filled 2015-11-15: qty 1

## 2015-11-15 MED ORDER — HYDROCODONE-ACETAMINOPHEN 5-325 MG PO TABS: 1.0000 | ORAL_TABLET | Freq: Four times a day (QID) | ORAL | 0 refills | Status: DC | PRN

## 2015-11-15 MED ORDER — HYDROCODONE-ACETAMINOPHEN 5-325 MG PO TABS
2.0000 | ORAL_TABLET | Freq: Once | ORAL | Status: AC
  Administered 2015-11-15: 2 via ORAL
  Filled 2015-11-15: qty 2

## 2015-11-15 NOTE — ED Notes (Signed)
Admitting MD at the bedside.  

## 2015-11-15 NOTE — ED Notes (Signed)
Dietary called stating that they do not have soup of the day - beef vegetable soup. Pt/pt's husband requested cream of chicken.

## 2015-11-15 NOTE — ED Triage Notes (Signed)
Pt to ED via [facility]  With c/o fall-- pulled down by dog when walking today-- landed on buttocks-- c/o severe back pain, hx of chronic pain in back.  On EMS arrival to house, pt was extremely diaphoretic, dizzy- c/o dizziness started this am, with lightheadedness.

## 2015-11-15 NOTE — ED Notes (Signed)
Ortho tech paged for brace application. 

## 2015-11-15 NOTE — ED Provider Notes (Signed)
[facility] DEPT Provider Note  CSN: [id number] Arrival date & time: 11/15/15  1402  History   Chief Complaint Chief Complaint  Patient presents with  . Fall  . level 1 downgraded   HPI Shannon [name] is a 65 y.o. female.   Fall  This is a new problem. The current episode started 1 to 2 hours ago. The problem occurs constantly. The problem has been resolved (Pulled down to the ground by her dog). Pertinent negatives include no chest pain and no shortness of breath.   No past medical history on file.  There are no active problems to display for this patient.  No past surgical history on file.  OB History    No data available     Home Medications    Prior to Admission medications   Not on File   Family History No family history on file.  Social History Social History  Substance Use Topics  . Smoking status: Not on file  . Smokeless tobacco: Not on file  . Alcohol use Not on file   Allergies   Patient has no allergy information on record.   Review of Systems Review of Systems  Respiratory: Negative for shortness of breath and stridor.   Cardiovascular: Negative for chest pain.  Neurological: Positive for light-headedness. Negative for syncope.  All other systems reviewed and are negative.  Physical Exam Updated Vital Signs There were no vitals taken for this visit.  Physical Exam  Constitutional: She is oriented to person, place, and time. She appears well-developed and well-nourished. No distress.  HENT:  Head: Normocephalic and atraumatic.  Eyes: EOM are normal. Pupils are equal, round, and reactive to light.  Neck: Normal range of motion.  Cardiovascular: Normal rate and regular rhythm.   Pulmonary/Chest: Effort normal. No respiratory distress. She has no wheezes.  Abdominal: Soft. She exhibits no distension and no mass. There is no tenderness. There is no guarding.  Musculoskeletal: Normal range of motion.  Pelvis stable  Tenderness to lumbar  spine without step offs or deformities  Neurological: She is alert and oriented to person, place, and time. No cranial nerve deficit. Coordination normal.  5/5 plantar flexion,dorsiflexion and hip flexion.   Upper lumbar tenderness without stepoffs or deformities.   Skin: Skin is warm. Capillary refill takes less than 2 seconds. She is not diaphoretic.  Psychiatric: She has a normal mood and affect. Her behavior is normal. Thought content normal.  Nursing note and vitals reviewed.  ED Treatments / Results  Labs (all labs ordered are listed, but only abnormal results are displayed) Labs Reviewed  CBC - Abnormal; Notable for the following:       Result Value   WBC 13.8 (*)    All other components within normal limits  BASIC METABOLIC PANEL  I-STAT TROPOININ, ED  I-STAT CHEM 8, ED  PREPARE FRESH FROZEN PLASMA  TYPE AND SCREEN    EKG  EKG Interpretation  Date/Time:  Thursday November 15 2015 14:05:40 EST Ventricular Rate:  64 PR Interval:    QRS Duration: 95 QT Interval:  459 QTC Calculation: 474 R Axis:   78 Text Interpretation:  Junctional rhythm Anterior infarct, age indeterminate no prior EKG for comparison  Confirmed by LIU MD, DANA [phone]) on 11/15/2015 2:22:13 PM      Radiology No results found.  Procedures Procedures (including critical care time)  Medications Ordered in ED Medications - No data to display   Initial Impression / Assessment and Plan / ED Course  I have reviewed the triage vital signs and the nursing notes.  Pertinent labs & imaging results that were available during my care of the patient were reviewed by me and considered in my medical decision making (see chart for details).  Clinical Course     Final Clinical Impressions(s) / ED Diagnoses   Patient is a 65 year old female who presents to emergency department today as initial level I trauma after a fall. Patient was reportedly being pulled by her dog when she fell to the ground. She  landed on her buttocks and complains only of lower back pain. She denies hitting her head or losing consciousness.  Patient was found by EMS to have a systolic blood pressure in the 70s. She was diaphoretic and pale.  Patient reports a history of lightheadedness and states that her primary care physician has been changing her blood pressure medications. She also endorses previous episodes of dehydration and endorses a decrease in PO intake. She also endorsed the pain in her back was worse and could have been a factor.   Most recently PCP added back amlodipine and discontinued her hydrochlorothiazide. She has chronic pain in her back. She denies any overuse of her pain medication. Patient denies any chest pain or shortness of breath.  Level I was downgraded.  Possible medication induced with her amlodipine. Patient also endorses decrease in PO intake and history of problems with the same. Denies any rectal bleeding. Patient also endorses increased urination last night but denies dysuria. Will obtain POCT urinalysis.   EKG shows sinus rhythm with no ischemia and regular rate.  Patient well appearing with reassuring vitals here.  X-rays show possible T12 compression fracture. We'll obtain CT to reevaluate. Neurovascularly intact. Well appearing.  Patient is point tender along this area. Plan for TLSO brace. After CT, will speak with neurosurgery about plan with TLSO brace and follow-up in the clinic.  Patient will need to follow up with her PCP regarding her intermittent lightheadedness and possibly decrease in amlodipine. Also encouraged increased PO intake.   Patient was in agreement with this plan and care  transferred to Dr. Ellene Route pending CT and POCT UA.  Final diagnoses:  T12 compression fracture [facility])  Fall, initial encounter      Roberto Scales, MD 11/15/15 [place] Liu, MD 11/15/15 1758

## 2015-11-15 NOTE — H&P (Signed)
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 Patient presented to the emergency department on 11/9 after sustaining a fall at home. Patient was told to the ground by her dog and landed on her buttocks. She complained of lumbar back pain. No radiation of the pain. No alleviating or exacerbating factors pain. No fever chills at home. No bowel or bladder incontinence. No abdominal pain. Patient does not take anti-coagulants. Patient states she was in her normal state of health prior to falling at home. Patient reports that she recently stopped taking prednisone; she is on this medication for nearly one year and stopped taking it after a long taper last Tuesday. Patient states she has seen a variety of rheumatologists for chronic joint pain, but was never formally diagnosed with RA. 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 For management of essential hypertension, patient currently takes valsartan, amlodipine, and atenolol. She last took all 3 medications the evening prior to admission. Patient states her primary care doctor has been adjusting her antihypertensive regimen, as patient has experienced presyncope at home.  ED Course:  Patient was hemodynamically stable in the emergency department. She was afebrile, heart rate in the 60s, an initial blood pressure of 118/75. CT of the thoracic spine reveals T12 compression fracture. During recheck of orthostatic vital signs, patient was noted to be hypotensive with a systolic blood pressure in the 50s. Patient was given IV fluid for hydration and vital signs improved.  Review of Systems: A  complete ROS was obtained; pertinent positives negatives are denoted in the HPI. Otherwise, all systems are negative.   Past Medical History -Essential hypertension on 3 drug regimen -Suspected heart failure with reduced ejection fraction. 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 She quit smoking cigarettes approximately 2 years ago. Occasional and social alcohol use. No recreational drug use.  Family History Patient notes both her father mother have essential hypertension.   Physical Exam: Vitals:   11/15/15 2232 11/15/15 2234 11/15/15 2235 11/15/15 2242  BP: 101/62 (!) 72/48 (!) 58/39 116/64  Pulse: 66 67 68 (!) 58  Resp:      Temp:      TempSrc:      SpO2: 94% 94% 100% 95%   General: Appears calm and comfortable. ENT: Grossly normal hearing, MMM. Cardiovascular: RRR. No M/R/G. No LE edema.  Respiratory: CTA bilaterally. No wheezes or crackles. Normal respiratory effort. Abdomen: Soft, non-tender. Bowel sounds present.  Skin: No rash or induration seen on limited exam. Musculoskeletal: Grossly normal tone BUE/BLE. Appropriate ROM. Has lumbar back brace.  Psychiatric: Grossly normal mood and affect. Neurologic: Moves all extremities in coordinated fashion.  I have personally reviewed the following labs, culture data, and imaging studies.  Labs:  Admission laboratory shows mild leukocytosis of 13.8, hemoglobin 12, platelet count 334. Patient has normal renal function. Initial troponin negative. UA is negative for nitrates and leukocytes.  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 Patient presented to the emergency department on 11/9 after sustaining a fall at home.   #1 Fall / T12 Compression Fx Patient's fall was mechanical in nature, as her dog tripped her. No concern for arrhythmia, CV or ACS. Patient is mentating well currently. She was noted to have a T12 compression fracture. Patient is currently well controlled. Plan: -Norco for pain control -Continue lumbar brace  #2 Orthostatic hypotension Patient was noted to have positive orthostasis in the emergency department. It appears patient has been experiencing presyncope with her 3 drug regimen prescribed for essential hypertension, which consists of valsartan, amlodipine, and atenolol. Her hypotension may be secondary to an excessive antihypertensive regimen. However, patient recently completed completed a 64-year-long course of prednisone that was gradually tapered. Adrenal insufficiency is possible, but felt to be less likely at this time. I doubt the symptomatic hypotension is from an infection, as her exam is benign, there is no clear source of infection. Her abdomen is soft and nontender, and I highly doubt there is an intra-peritoneal bleed. Plan: -IV fluids for hydration  -Recheck orthostatic vital signs  -Transthoracic echo in the morning  -Tele -Stress dose steroids for refractory hypertension   #3 HFrEF Patient notes a history of her left ventricle not contracting properly. She denies a history of CAD. Other records are not available to me at this time. She appears approximate euvolemic on examination. It is possible her hypotension may be secondary to a cardiac cause, though I think this is less likely. She is warm and well perfused on examination. Plan: -Judicious use of IV fluid -Check transthoracic echo in the morning  #4 Depression This appears to be a stable condition. Plan: -Continue home duloxetine  DVT prophylaxis: Subq Lovenox Code Status: Full Code Disposition  Plan: Anticipate D/C home in 1-2 days Consults called: N/A Admission status: Observation  Clydene Laming, MD [facility] Page:[phone]  If 7PM-7AM, please contact night-coverage [email or web address] Password [email or web address]

## 2015-11-15 NOTE — ED Provider Notes (Signed)
  Physical Exam  BP 125/60   Pulse 63   Temp 97.9 F (36.6 C) (Oral)   Resp 13   SpO2 99%   Physical Exam  Constitutional: She is oriented to person, place, and time. She appears well-developed and well-nourished. No distress.  Abdominal: Soft. She exhibits no distension. There is no tenderness. There is no rebound and no guarding.  Neurological: She is alert and oriented to person, place, and time.  5/5 strength of hip flexion/extension, knee flexion/extension, and dorsiflexion/plantarflexion bilaterally. Sensation intact in all distributions. No ankle clonus. 2+ symmetric patellar reflexes bilaterally.  Skin: She is not diaphoretic.    ED Course  Procedures  MDM Care of patient taken over from Dr. Martyn Ehrich at 1600. Briefly, patient presents after a fall from standing when she was walking her dog. Initially called as Level 1 found to be hypotensive, though per the patient this has been a recurring problem and she is following her primary care physician for it. EKG reveals junctional rhythm with no priors for comparison. Plain film with possible T12 thoracic compression fracture. Plan is to follow up on CT thoracic spine. If fracture found, will place in TLSO brace and arrange neurosurgery follow up.  Patient found to have T12 compression fracture without height loss. Normal neurological examination on re-assessment. No abdominal tenderness. She was given an additional toradol and Norco for pain relief and was fitted with [employer] Quick-Draw brace on discussion with neurosurgery who recommend outpatient follow-up in 1-2 weeks. Patient was ambulatory and was set to be discharged. While getting ready to be discharged, she became lightheaded and diaphoretic on standing and was found to be hypotensive while standing to 50s/30s, normotensive while laying down. Do not suspect hemorrhagic shock as she has been normotensive for several hours while here and has no abdominal tenderness. Mechanism not likely to  have caused hemorrhagic shock that is just appearing now. She will be admitted to hospitalist service for management of orthostatic hypotension.     Harlin Heys, MD 11/16/15 Laureen Abrahams    Blanchie Dessert, MD 11/16/15 2036

## 2015-11-15 NOTE — ED Notes (Signed)
PT AO x 4, fell out her house while walking her dog out, became very diaphoretic and having low BP on EMS arrival with a BP of 50/40.  Pt assessed on arrival 1L NS given and 50 mcg Fentanyl IV for pain, BP up to 100's/60's, CT back shows a T 12 compression fx and back braise placed on the pt, pt then able to walk with assistance and a walker. Right before getting dc from the hospital, pt got dizzy and diaphoretic with positive Orthostatic hypotension. Another bag of fluids given, pt still AO x 4 no neuro deficit present. Has a 13.8 WBC some ketones and protein on urine, but no infection identified. Junctional rhythm on monitor.

## 2015-11-15 NOTE — ED Notes (Signed)
Pt unable to complete orthostatic vital signs due to pain with repositioning.

## 2015-11-15 NOTE — Progress Notes (Signed)
Orthopedic Tech Progress Note Patient Details:  Shannon [name] 03-Jul-1950 [id number]  Patient ID: Shannon [name], female   DOB: 08/30/1950, 65 y.o.   MRN: [id number]   Shannon [name] 11/15/2015, 6:35 PMCalled [employer] for Quick Draw brace.

## 2015-11-16 DIAGNOSIS — W19XXXA Unspecified fall, initial encounter: Secondary | ICD-10-CM | POA: Diagnosis present

## 2015-11-16 DIAGNOSIS — S22000S Wedge compression fracture of unspecified thoracic vertebra, sequela: Secondary | ICD-10-CM | POA: Diagnosis not present

## 2015-11-16 DIAGNOSIS — F329 Major depressive disorder, single episode, unspecified: Secondary | ICD-10-CM

## 2015-11-16 DIAGNOSIS — I4891 Unspecified atrial fibrillation: Secondary | ICD-10-CM | POA: Diagnosis not present

## 2015-11-16 DIAGNOSIS — F32A Depression, unspecified: Secondary | ICD-10-CM | POA: Diagnosis present

## 2015-11-16 DIAGNOSIS — W19XXXS Unspecified fall, sequela: Secondary | ICD-10-CM | POA: Diagnosis not present

## 2015-11-16 DIAGNOSIS — S22000A Wedge compression fracture of unspecified thoracic vertebra, initial encounter for closed fracture: Secondary | ICD-10-CM | POA: Diagnosis present

## 2015-11-16 DIAGNOSIS — I951 Orthostatic hypotension: Secondary | ICD-10-CM | POA: Diagnosis not present

## 2015-11-16 DIAGNOSIS — I10 Essential (primary) hypertension: Secondary | ICD-10-CM

## 2015-11-16 LAB — COMPREHENSIVE METABOLIC PANEL
ALBUMIN: 3.3 g/dL — AB (ref 3.5–5.0)
ALK PHOS: 62 U/L (ref 38–126)
ALT: 26 U/L (ref 14–54)
ANION GAP: 9 (ref 5–15)
AST: 26 U/L (ref 15–41)
BILIRUBIN TOTAL: 0.4 mg/dL (ref 0.3–1.2)
BUN: 9 mg/dL (ref 6–20)
CALCIUM: 8.8 mg/dL — AB (ref 8.9–10.3)
CO2: 21 mmol/L — AB (ref 22–32)
Chloride: 112 mmol/L — ABNORMAL HIGH (ref 101–111)
Creatinine, Ser: 0.66 mg/dL (ref 0.44–1.00)
GFR calc non Af Amer: >60 mL/min (ref >60)
Glucose, Bld: 118 mg/dL — ABNORMAL HIGH (ref 65–99)
POTASSIUM: 3.6 mmol/L (ref 3.5–5.1)
SODIUM: 142 mmol/L (ref 135–145)
TOTAL PROTEIN: 5.9 g/dL — AB (ref 6.5–8.1)

## 2015-11-16 LAB — CBC
HCT: 34.5 % — ABNORMAL LOW (ref 36.0–46.0)
HEMATOCRIT: 34.1 % — AB (ref 36.0–46.0)
HEMOGLOBIN: 11.2 g/dL — AB (ref 12.0–15.0)
HEMOGLOBIN: 10.9 g/dL — AB (ref 12.0–15.0)
MCH: 29.6 pg (ref 26.0–34.0)
MCH: 29.2 pg (ref 26.0–34.0)
MCHC: 32.5 g/dL (ref 30.0–36.0)
MCHC: 32 g/dL (ref 30.0–36.0)
MCV: 91 fL (ref 78.0–100.0)
MCV: 91.4 fL (ref 78.0–100.0)
PLATELETS: 296 10*3/uL (ref 150–400)
Platelets: 262 10*3/uL (ref 150–400)
RBC: 3.79 MIL/uL — AB (ref 3.87–5.11)
RBC: 3.73 MIL/uL — AB (ref 3.87–5.11)
RDW: 13.8 % (ref 11.5–15.5)
RDW: 14 % (ref 11.5–15.5)
WBC: 12.7 10*3/uL — ABNORMAL HIGH (ref 4.0–10.5)
WBC: 9.3 10*3/uL (ref 4.0–10.5)

## 2015-11-16 LAB — CREATININE, SERUM
CREATININE: 0.78 mg/dL (ref 0.44–1.00)
GFR calc Af Amer: >60 mL/min (ref >60)

## 2015-11-16 LAB — BLOOD PRODUCT ORDER (VERBAL) VERIFICATION

## 2015-11-16 MED ORDER — TRAMADOL HCL 50 MG PO TABS
50.0000 mg | ORAL_TABLET | Freq: Every evening | ORAL | Status: DC | PRN
  Administered 2015-11-16: 50 mg via ORAL
  Filled 2015-11-16: qty 1

## 2015-11-16 MED ORDER — ACETAMINOPHEN 325 MG PO TABS
325.0000 mg | ORAL_TABLET | Freq: Four times a day (QID) | ORAL | Status: DC | PRN
  Administered 2015-11-16 (×2): 650 mg via ORAL
  Filled 2015-11-16 (×2): qty 2

## 2015-11-16 MED ORDER — HYDROCODONE-ACETAMINOPHEN 10-325 MG PO TABS
1.0000 | ORAL_TABLET | Freq: Four times a day (QID) | ORAL | Status: DC | PRN
  Administered 2015-11-16: 1 via ORAL
  Filled 2015-11-16: qty 2

## 2015-11-16 MED ORDER — ENOXAPARIN SODIUM 40 MG/0.4ML ~~LOC~~ SOLN
40.0000 mg | Freq: Every day | SUBCUTANEOUS | Status: DC
  Administered 2015-11-16 – 2015-11-17 (×2): 40 mg via SUBCUTANEOUS
  Filled 2015-11-16 (×2): qty 0.4

## 2015-11-16 MED ORDER — HYDROCODONE-ACETAMINOPHEN 5-325 MG PO TABS
1.0000 | ORAL_TABLET | Freq: Four times a day (QID) | ORAL | Status: DC | PRN
  Administered 2015-11-16 – 2015-11-19 (×15): 1 via ORAL
  Filled 2015-11-16 (×17): qty 1

## 2015-11-16 MED ORDER — DULOXETINE HCL 30 MG PO CPEP
30.0000 mg | ORAL_CAPSULE | Freq: Every day | ORAL | Status: DC
  Administered 2015-11-16 – 2015-11-19 (×4): 30 mg via ORAL
  Filled 2015-11-16 (×4): qty 1

## 2015-11-16 MED ORDER — COSYNTROPIN 0.25 MG IJ SOLR
0.2500 mg | Freq: Once | INTRAMUSCULAR | Status: AC
  Administered 2015-11-17: 0.25 mg via INTRAVENOUS
  Filled 2015-11-16: qty 0.25

## 2015-11-16 MED ORDER — SODIUM CHLORIDE 0.9 % IV BOLUS (SEPSIS)
500.0000 mL | Freq: Once | INTRAVENOUS | Status: AC
  Administered 2015-11-16: 500 mL via INTRAVENOUS

## 2015-11-16 NOTE — Consult Note (Signed)
CARDIOLOGY CONSULT NOTE   Patient ID: Shannon [name] MRN: [id number] DOB/AGE: 1950-08-15 65 y.o.   This patient has two different identity in [place]. This admission her MRN # is listed above. Old MRN # is [id number]. Spoke with IT department and they will merge records with old MRN #.   Admit date: 11/15/2015  Primary Physician   PROVIDER NOT [facility]   New  Reason for Consultation   Orthostatic hypotension Requesting Physician  Dr. Charlies Silvers  HPI: Shannon [name] is a 65 y.o. female with a history of HTN, GERD, OSA and depression who recently seen in clinic for cardiac evaluation after a 2D ECHO showed G2DD (she requested a specialist) now presented to [facility] after fall 11/15/15.   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 She recently had a nuclear stress test for atypical chest pain/jaw pain which showed no ischemia but EF 48%. A follow up echo was performed which showed normal LV function with G2DD and mild LAE.  Seen in Clinic by Angelena Form, PA-C 11/02/15. When she was taken off her HCTZ she had some LE swelling but none recently. Only after long car rides. No orthopnea or PND. No dizziness or syncope. She has had some left sided chest pain that she thinks is related to her shoulder. She does have SOB. 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 Not very active due to this chronic pain she has had from the lupus. Chest pain/sob not exertional. She wants to start exercising more. No blood in stool or urine. Advised life style modification and f/u PRN.   [place] when she sustained fall at home. She pulled down by dog and landed on her buttock. She was felling dizzy before and after fall. No syncope. Upon arrival normal. She was given Fentanyl/Norco for pain. However around 2242 when she about to discharge from ER  to get orthostatic her BP dropped to 72/48-->58/39 (she was dizzy) requiring hydration and overnight admission.   Orthostatic VS for the past 24 hrs:  BP- Lying Pulse- Lying BP- Sitting Pulse- Sitting BP- Standing at 0 minutes Pulse- Standing at 0 minutes  11/16/15 0238 131/66 68 128/72 76 108/55 79  11/15/15 2233 101/62 52 (!) 72/48 63 (!) 58/39 65    EKG showed sinus rhythm at rate of 64 bpm. Troponin negative. Electrolyte normal. CT of the thoracic spine reveals T12 compression fracture. Her BP usually runs in 100/60 Amlodipine 10mg  qd, atenolol 100mg  qd and valsartan 320mg  qd. Her antihypertensive held and BP stable now.    Past Medical History:  Diagnosis Date  . Anxiety   . Arthritis    neck and back  . Bronchitis   . Degenerative disorder of bone   . Depression   . GERD (gastroesophageal reflux disease)   . Herniated disc, cervical   . Hypertension   . Obstructive sleep apnea   . Osteopenia   . Pinched nerve in neck   . Sleep apnea   . Vitamin D deficiency   . Whiplash injury 06/07/2010         Past Surgical History:  Procedure Laterality Date  . APPENDECTOMY    . arm surgery Left      Allergies  Allergen Reactions  . Ace Inhibitors Cough  . Hctz [Hydrochlorothiazide] Other (See Comments)    Caused  drug-induced LUPUS  . Other Other (See Comments)    Unknown anti-inflammatory: Caused extremely aggressive behavior (not Prednisone)  . Oxycodone Other (See Comments)    Hallucinations  . Sulfa Antibiotics Nausea And Vomiting    I have reviewed the patient's current medications . DULoxetine  30 mg Oral Daily  . enoxaparin (LOVENOX) injection  40 mg Subcutaneous Daily    acetaminophen, traMADol  Prior to Admission medications   Medication Sig Start Date End Date Taking? Authorizing Provider  acetaminophen (TYLENOL) 325 MG tablet Take 325-650 mg by mouth every 6 (six) hours as needed for mild pain.   Yes Historical Provider, MD  amLODipine  (NORVASC) 10 MG tablet Take 10 mg by mouth daily.   Yes Historical Provider, MD  atenolol (TENORMIN) 100 MG tablet Take 100 mg by mouth at bedtime.   Yes Historical Provider, MD  Calcium Carb-Cholecalciferol (CALCIUM + D3 PO) Take 1 tablet by mouth at bedtime.   Yes Historical Provider, MD  Cholecalciferol (VITAMIN D-3 PO) Take 1 capsule by mouth at bedtime.   Yes Historical Provider, MD  DULoxetine (CYMBALTA) 30 MG capsule Take 30 mg by mouth daily.   Yes Historical Provider, MD  naproxen sodium (ALEVE) 220 MG tablet Take 220 mg by mouth 2 (two) times daily as needed (for pain).   Yes Historical Provider, MD  traMADol (ULTRAM) 50 MG tablet Take 50 mg by mouth at bedtime as needed (for pain).   Yes Historical Provider, MD  valsartan (DIOVAN) 320 MG tablet Take 320 mg by mouth daily.   Yes Historical Provider, MD  Vitamin D, Ergocalciferol, (DRISDOL) 50000 units CAPS capsule Take 50,000 Units by mouth every 30 (thirty) days.   Yes Historical Provider, MD  HYDROcodone-acetaminophen (NORCO/VICODIN) 5-325 MG tablet Take 1-2 tablets by mouth every 6 (six) hours as needed for severe pain. 11/15/15   Harlin Heys, MD     Social History   Social History  . Marital status: Married    Spouse name: N/A  . Number of children: N/A  . Years of education: N/A   Occupational History  . Not on file.   Social History Main Topics  . Smoking status: Not on file  . Smokeless tobacco: Not on file  . Alcohol use Not on file  . Drug use: Unknown  . Sexual activity: Not on file   Other Topics Concern  . Not on file   Social History Narrative  . No narrative on file    No family status information on file.   The patient's family history includes Cancer in her mother; Hypertension in her father; Stroke in her father.   .   ROS:  Full 14 point review of systems complete and found to be negative unless listed above.  Physical Exam: Blood pressure 132/62, pulse 93, temperature 99.3 F (37.4 C),  temperature source Oral, resp. rate 20, height 5\' 7"  (1.702 m), weight 225 lb 11.2 oz (102.4 kg), SpO2 94 %.  General: Well developed, well nourished, female in no acute distress Head: Eyes PERRLA, No xanthomas. Normocephalic and atraumatic, oropharynx without edema or exudate.  Lungs: Resp regular and unlabored, CTA. Heart: RRR no s3, s4, or murmurs..   Neck: No carotid bruits. No lymphadenopathy.  No JVD. Abdomen: Bowel sounds present, abdomen soft and non-tender without masses or hernias noted. Msk:  No spine or cva tenderness. No weakness, no joint deformities or effusions. Extremities: No clubbing, cyanosis or edema. DP/PT/Radials 2+ and equal bilaterally. Neuro: Alert and oriented X  3. No focal deficits noted. Psych:  Good affect, responds appropriately Skin: No rashes or lesions noted.  Labs:   Lab Results  Component Value Date   WBC 9.3 11/16/2015   HGB 10.9 (L) 11/16/2015   HCT 34.1 (L) 11/16/2015   MCV 91.4 11/16/2015   PLT 262 11/16/2015   No results for input(s): INR in the last 72 hours.  Recent Labs Lab 11/16/15 0317  NA 142  K 3.6  CL 112*  CO2 21*  BUN 9  CREATININE 0.66  CALCIUM 8.8*  PROT 5.9*  BILITOT 0.4  ALKPHOS 62  ALT 26  AST 26  GLUCOSE 118*  ALBUMIN 3.3*   No results found for: MG No results for input(s): CKTOTAL, CKMB, TROPONINI in the last 72 hours.  Recent Labs  11/15/15 1427  TROPIPOC 0.03   Additional studies/ records that were reviewed today include:  2D ECHO: 10/24/2015 LV EF: 55% - 60% Study Conclusions - Left ventricle: The cavity size was normal. Wall thickness was increased in a pattern of mild LVH. Systolic function was normal. The estimated ejection fraction was in the range of 55% to 60%. Wall motion was normal; there were no regional wall motion abnormalities. Features are consistent with a pseudonormal left ventricular filling pattern, with concomitant abnormal relaxation and increased filling pressure  (grade 2 diastolic dysfunction). - Left atrium: The atrium was moderately dilated. Impressions: - Normal LV systolic function; grade 2 diastolic dysfunction; mild LAE.   Myoview 09/07/15 Study Highlights    Nuclear stress EF: 48%.  There was no ST segment deviation noted during stress.  The study is normal.  The left ventricular ejection fraction is mildly decreased (45-54%).  Normal resting and stress perfusion. No ischemia or infarction EF 48% mild diffuse hypokinesis suggest echo or MRI correlation      Radiology:  Dg Chest 2 View  Result Date: 11/15/2015 CLINICAL DATA:  [place] today with pain in the lower back EXAM: CHEST  2 VIEW COMPARISON:  None. FINDINGS: No active infiltrate or effusion is seen. Mediastinal and hilar contours are unremarkable. The heart is within upper limits normal. No acute bony abnormality is seen. IMPRESSION: No active lung disease. No fracture is seen. The heart is upper normal in size. Electronically Signed   By: Ivar Drape M.D.   On: 11/15/2015 15:12   Dg Lumbar Spine 2-3 Views  Result Date: 11/15/2015 CLINICAL DATA:  [place] today with pain in the back EXAM: LUMBAR SPINE - 2-3 VIEW COMPARISON:  None. FINDINGS: The lumbar vertebrae are in normal alignment. Intervertebral disc spaces appear normal with the exception of degenerative disc disease at L5-S1. However, there does appear to be a partial compression deformity of the anterior superior aspect of T12 vertebral body most likely acute. No retropulsion is seen. CT or MRI could be useful for further evaluation if indicated. IMPRESSION: 1. Suspect acute compression fracture of the anterior superior aspect of T12 vertebral body. Consider CT or MRI if further assessment is warranted. 2. Degenerative disc disease at L5-S1 Electronically Signed   By: Ivar Drape M.D.   On: 11/15/2015 15:14   Dg Pelvis 1-2 Views  Result Date: 11/15/2015 CLINICAL DATA:  [place] today with pain in the low back EXAM: PELVIS -  1-2 VIEW COMPARISON:  None. FINDINGS: Both hips are normal position with normal hip joint spaces. The pelvic rami are intact. The SI joints are corticated. The sacral foramina appear normal. No acute fracture is seen. IMPRESSION: Negative. Electronically Signed  By: Ivar Drape M.D.   On: 11/15/2015 15:15   Ct Thoracic Spine Wo Contrast  Result Date: 11/15/2015 CLINICAL DATA:  Fall today. Back pain. T12 compression fracture on radiographs. Initial encounter. EXAM: CT THORACIC SPINE WITHOUT CONTRAST TECHNIQUE: Multidetector CT imaging of the thoracic spine was performed without intravenous contrast administration. Multiplanar CT image reconstructions were also generated. COMPARISON:  Chest and lumbar spine radiographs earlier today FINDINGS: Alignment: No subluxation. Vertebrae: As seen on earlier radiographs, there is a fracture of T12 involving the superior endplate and anterior vertebral body with minimal vertebral body height loss predominantly on the left. There is no retropulsion or posterior element fracture identified. No additional thoracic spine fracture is identified. Paraspinal and other soft tissues: Aortic atherosclerosis without aneurysm. Small sliding hiatal hernia. Coronary artery atherosclerosis. Disc levels: Mild thoracic spondylosis. No evidence of significant spinal stenosis on this non myelographic examination. Mild to moderate left neural foraminal stenosis at T7-8 due to facet arthrosis. IMPRESSION: 1. Acute appearing T12 compression fracture with minimal height loss. 2. Aortic atherosclerosis. Electronically Signed   By: Logan Bores M.D.   On: 11/15/2015 16:50    ASSESSMENT AND PLAN:     1. Orthostatic hypotension - Likely due to multiple antihypertensive. She take all at night (Amlodipine 10mg  qd, atenolol 100mg  qd and valsartan 32mg  qd). She was positive for orthostatic in ER. Her fall like due to this. Currently BP stable. Slowly add antihypertensive and spread out through out  day. May try compression stocking or abdominal binder if ongoing issue. Telemetry showed sinus rhythm at rate of 70-80s with PVCs.   2. Chronic diastolic dysfunction - She is euvolemic.   3. OSA on CPAP - Advised to loose weight.    SignedLeanor Kail, PA 11/16/2015, 2:41 PM Pager [phone]  Co-Sign MD  Attending Note:   The patient was seen and examined.  Agree with assessment and plan as noted above.  Changes made to the above note as needed.  Patient seen and independently examined with Nilda Calamity, PA .   We discussed all aspects of the encounter. I agree with the assessment and plan as stated above.  1. Hypotension:   This is curious that she tolerated her BP meds for years and now became very hypotensive. ? Had drug induced lupus.  She was on prednisone for a year - the prednisone was stopped recently  Although her sodium and potassium levels do not suggest adrenal insufficiency, her extremely low BP is one that is suggestive of adrenal insufficiency. I would do a Cortrosyn stim test tomorrow  2. Hypertension:   Would slowly restart bp meds when then are needed. Would add Valsartan and beta blocker first and hold off on amlodipine for now.    I have spent a total of 40 minutes with patient reviewing hospital  notes , telemetry, EKGs, labs and examining patient as well as establishing an assessment and plan that was discussed with the patient. > 50% of time was spent in direct patient care.    Thayer Headings, Brooke Bonito., MD, [facility] 11/16/2015, 3:33 PM [address]  [place] Pager [phone]

## 2015-11-16 NOTE — Progress Notes (Signed)
Patient ID: Shannon [name], female   DOB: March 11, 1950, 65 y.o.   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 Patient presented to the emergency department on 11/9 after sustaining a fall at home. She complained of lumbar back pain.   Patient was hemodynamically stable in the emergency department. She was afebrile, heart rate in the 60s, an initial blood pressure of 118/75. Her BP was as low as 58/39. CT of the thoracic spine revealed T12 compression fracture and has a brace on.  Assessment & Plan:   Active Problems:   Orthostatic hypotension - Unclear etiology  - 2 D ECHO is pending - Continue to monitor vital signs and we will repeat orthostatic vitals - Appreciate cardiology consult and recommendations  Fall / acute T12 compression fracture - Has a brace placed, will follow-up with neurosurgery on an outpatient basis  Depression - Stable mental status - Continue Cymbalta    DVT prophylaxis: Lovenox subQ Code Status: full code  Family Communication: spoke with pt husband over the phone [phone] Disposition Plan: continue orthostatic vital signs    Consultants:   Cardiology   Procedures:   None   Antimicrobials:   None    Subjective: No overnight events.  Objective: Vitals:   11/15/15 2345 11/16/15 0014 11/16/15 0517 11/16/15 0948  BP: 121/58 (!) 151/67 (!) 145/61 (!) 126/58  Pulse: 60 68 74 78  Resp:  18 18 20   Temp:  98 F (36.7 C) 98.1 F (36.7 C) 98.7 F (37.1 C)  TempSrc:  Oral Oral Oral  SpO2: 100% 100% 96% 95%  Weight:  102.4 kg (225 lb 11.2 oz)    Height:  5\' 7"  (1.702 m)      Intake/Output Summary (Last 24 hours) at 11/16/15  1003 Last data filed at 11/15/15 1840  Gross per 24 hour  Intake             1000 ml  Output                0 ml  Net             1000 ml   Filed Weights   11/16/15 0014  Weight: 102.4 kg (225 lb 11.2 oz)    Examination:  General exam: Appears calm and comfortable  Respiratory system: Clear to auscultation. Respiratory effort normal. Cardiovascular system: S1 & S2 heard, RRR. No JVD, murmurs, rubs, gallops or clicks. No pedal edema. Gastrointestinal system: Abdomen is nondistended, soft and nontender. No organomegaly or masses felt. Normal bowel sounds heard. Central nervous system: Alert and oriented. No focal neurological deficits. Has lumbar brace on  Extremities: Symmetric 5 x 5 power. Skin: No rashes, lesions or ulcers Psychiatry: Judgement and insight appear normal. Mood & affect appropriate.   Data Reviewed: I have personally reviewed following labs and imaging studies  CBC:  Recent Labs Lab 11/15/15 1400 11/15/15 1429 11/16/15 0110 11/16/15 0317  WBC 13.8*  --  12.7* 9.3  HGB 12.0 12.2 11.2* 10.9*  HCT 36.8 36.0 34.5* 34.1*  MCV 90.9  --  91.0 91.4  PLT 334  --  296 [id number]   Basic Metabolic Panel:  Recent Labs Lab 11/15/15 1400 11/15/15 1429 11/16/15 0110 11/16/15 0317  NA 139 141  --  142  K 4.3 4.3  --  3.6  CL 105 104  --  112*  CO2 24  --   --  21*  GLUCOSE 126* 123*  --  118*  BUN 10 11  --  9  CREATININE 0.87 0.90 0.78 0.66  CALCIUM 9.1  --   --  8.8*   GFR: Estimated Creatinine Clearance: 87.4 mL/min (by C-G formula based on SCr of 0.66 mg/dL). Liver Function Tests:  Recent Labs Lab 11/16/15 0317  AST 26  ALT 26  ALKPHOS 62  BILITOT 0.4  PROT 5.9*  ALBUMIN 3.3*   No results for input(s): LIPASE, AMYLASE in the last 168 hours. No results for input(s): AMMONIA in the last 168 hours. Coagulation Profile: No results for input(s): INR, PROTIME in the last 168 hours. Cardiac Enzymes: No results for input(s): CKTOTAL, CKMB, CKMBINDEX,  TROPONINI in the last 168 hours. BNP (last 3 results) No results for input(s): PROBNP in the last 8760 hours. HbA1C: No results for input(s): HGBA1C in the last 72 hours. CBG: No results for input(s): GLUCAP in the last 168 hours. Lipid Profile: No results for input(s): CHOL, HDL, LDLCALC, TRIG, CHOLHDL, LDLDIRECT in the last 72 hours. Thyroid Function Tests: No results for input(s): TSH, T4TOTAL, FREET4, T3FREE, THYROIDAB in the last 72 hours. Anemia Panel: No results for input(s): VITAMINB12, FOLATE, FERRITIN, TIBC, IRON, RETICCTPCT in the last 72 hours. Urine analysis:    Component Value Date/Time   COLORURINE YELLOW 11/15/2015 1838   APPEARANCEUR HAZY (A) 11/15/2015 1838   LABSPEC 1.024 11/15/2015 1838   PHURINE 5.5 11/15/2015 1838   GLUCOSEU NEGATIVE 11/15/2015 1838   HGBUR NEGATIVE 11/15/2015 1838   BILIRUBINUR SMALL (A) 11/15/2015 1838   KETONESUR 15 (A) 11/15/2015 1838   PROTEINUR 30 (A) 11/15/2015 1838   NITRITE NEGATIVE 11/15/2015 1838   LEUKOCYTESUR NEGATIVE 11/15/2015 1838   Sepsis Labs: @LABRCNTIP (procalcitonin:4,lacticidven:4)   )No results found for this or any previous visit (from the past 240 hour(s)).    Radiology Studies: Dg Chest 2 View Result Date: 11/15/2015 No active lung disease. No fracture is seen. The heart is upper normal in size.   Dg Lumbar Spine 2-3 Views Result Date: 11/15/2015 1. Suspect acute compression fracture of the anterior superior aspect of T12 vertebral body. Consider CT or MRI if further assessment is warranted. 2. Degenerative disc disease at L5-S1   Dg Pelvis 1-2 Views Result Date: 11/15/2015 Negative.   Ct Thoracic Spine Wo Contrast Result Date: 11/15/2015  1. Acute appearing T12 compression fracture with minimal height loss. 2. Aortic atherosclerosis.     Scheduled Meds: . DULoxetine  30 mg Oral Daily  . enoxaparin (LOVENOX) injection  40 mg Subcutaneous Daily   Continuous Infusions:   LOS: 0 days    Time spent:  15 minutes  Greater than 50% of the time spent on counseling and coordinating the care.   Leisa Lenz, MD [facility] Pager [phone]  If 7PM-7AM, please contact night-coverage [email or web address] Password [email or web address] 11/16/2015, 10:03 AM

## 2015-11-17 DIAGNOSIS — I10 Essential (primary) hypertension: Secondary | ICD-10-CM | POA: Diagnosis not present

## 2015-11-17 DIAGNOSIS — Z5181 Encounter for therapeutic drug level monitoring: Secondary | ICD-10-CM | POA: Diagnosis not present

## 2015-11-17 DIAGNOSIS — S22080A Wedge compression fracture of T11-T12 vertebra, initial encounter for closed fracture: Secondary | ICD-10-CM

## 2015-11-17 DIAGNOSIS — F329 Major depressive disorder, single episode, unspecified: Secondary | ICD-10-CM | POA: Diagnosis not present

## 2015-11-17 DIAGNOSIS — I4891 Unspecified atrial fibrillation: Secondary | ICD-10-CM | POA: Diagnosis not present

## 2015-11-17 DIAGNOSIS — I951 Orthostatic hypotension: Secondary | ICD-10-CM | POA: Diagnosis not present

## 2015-11-17 LAB — ACTH STIMULATION, 3 TIME POINTS
CORTISOL 30 MIN: 26.7 ug/dL
CORTISOL 60 MIN: 29.7 ug/dL
Cortisol, Base: 9.4 ug/dL

## 2015-11-17 MED ORDER — METOPROLOL TARTRATE 5 MG/5ML IV SOLN
2.5000 mg | Freq: Three times a day (TID) | INTRAVENOUS | Status: DC | PRN
  Administered 2015-11-17: 5 mg via INTRAVENOUS
  Filled 2015-11-17: qty 5

## 2015-11-17 MED ORDER — LOSARTAN POTASSIUM 50 MG PO TABS
25.0000 mg | ORAL_TABLET | Freq: Every day | ORAL | Status: DC
  Administered 2015-11-18 – 2015-11-19 (×2): 25 mg via ORAL
  Filled 2015-11-17 (×3): qty 1

## 2015-11-17 MED ORDER — METOPROLOL TARTRATE 5 MG/5ML IV SOLN
2.5000 mg | Freq: Once | INTRAVENOUS | Status: AC
  Administered 2015-11-17: 2.5 mg via INTRAVENOUS
  Filled 2015-11-17: qty 5

## 2015-11-17 MED ORDER — ATENOLOL 25 MG PO TABS
25.0000 mg | ORAL_TABLET | Freq: Every day | ORAL | Status: DC
  Filled 2015-11-17: qty 1

## 2015-11-17 NOTE — Evaluation (Addendum)
Physical Therapy Evaluation Patient Details Name: Shannon [name] MRN: [id number] DOB: Jul 09, 1950 Today's Date: 11/17/2015   History of Present Illness  Ms. 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 Patient presented to the emergency department on 11/9 after sustaining a fall at home due to dog tripping her. CT revealed T12 compression fracture.Pt also know to be hypotensive in ED with BP in 50s.  Clinical Impression  Pt admitted with above. Pt mobility greatly limited by significant pain upon movement. Pt required mod/maxA to achieve sitting EOB and was unable to attempt standing. Pt became diaphoretic however unable to get BP to take in sitting due to pt dependently supporting self on bilat UE. Pts BP 120/49 upon laying down. Currently at this time pt unsafe to return home as she was indep PTA but now requires mod/maxA due to pain. If pain can get under control and she can tolerate mobility will re-assess d/c recommendations. Recommend OT eval for ADLs assessment and education.    Follow Up Recommendations SNF    Equipment Recommendations  None recommended by PT    Recommendations for Other Services       Precautions / Restrictions Precautions Precautions: Fall Precaution Comments: T12 compression fracture Required Braces or Orthoses: Spinal Brace Spinal Brace: Lumbar corset Restrictions Weight Bearing Restrictions: No      Mobility  Bed Mobility Overal bed mobility: Needs Assistance Bed Mobility: Rolling;Sidelying to Sit;Sit to Sidelying Rolling: Min guard Sidelying to sit: Max assist     Sit to sidelying: Mod assist General bed mobility comments: pt initiated transfer however required maxA to complete sitting due to severe onset of pain. Pt required modA for LE management to get back into  bed  Transfers                 General transfer comment: pt unable to attempt standing due to excruciating pain  Ambulation/Gait             General Gait Details: unable  Stairs            Wheelchair Mobility    Modified Rankin (Stroke Patients Only)       Balance Overall balance assessment: Needs assistance Sitting-balance support: Feet supported;Bilateral upper extremity supported Sitting balance-Leahy Scale: Poor Sitting balance - Comments: pt dependent on UEs to prop self up due to pain. Pt unable to lift UE off bed without modA to maintain balance                                     Pertinent Vitals/Pain Pain Assessment: 0-10 Pain Score: 10-Worst pain ever Pain Location: back when sitting up, 0/10 in supine and at rest Pain Descriptors / Indicators: Sharp;Pressure ("bone on bone") Pain Intervention(s): Limited activity within patient's tolerance;Repositioned    Home Living Family/patient expects to be discharged to:: Private residence Living Arrangements: Spouse/significant other Available Help at Discharge: Family;Available PRN/intermittently (pt reports spouse can quit part time if need) Type of Home: Apartment Home Access: Level entry (but long walk from car)     Home Layout: One level Home Equipment: Walker - 2 wheels;Cane - single point;Grab bars - tub/shower;Shower seat      Prior Function Level of Independence: Independent         Comments: had been walking     Hand  Dominance   Dominant Hand: Right    Extremity/Trunk Assessment   Upper Extremity Assessment: Overall WFL for tasks assessed (unable to fully assess due to severe pain)           Lower Extremity Assessment:  (unable to full assess due to pain)      Cervical / Trunk Assessment: Other exceptions  Communication   Communication: No difficulties  Cognition Arousal/Alertness: Awake/alert Behavior During Therapy: WFL for tasks  assessed/performed Overall Cognitive Status: Within Functional Limits for tasks assessed                      General Comments General comments (skin integrity, edema, etc.): Pts BP 120/49 in supine upon returing from sitting. Pt with complete dependence on UEs an unable to take BP when in sitting    Exercises     Assessment/Plan    PT Assessment Patient needs continued PT services  PT Problem List Decreased strength;Decreased activity tolerance;Decreased balance;Decreased mobility;Decreased knowledge of precautions;Pain          PT Treatment Interventions DME instruction;Gait training;Stair training;Functional mobility training;Therapeutic activities;Therapeutic exercise;Balance training    PT Goals (Current goals can be found in the Care Plan section)  Acute Rehab PT Goals Patient Stated Goal: home PT Goal Formulation: With patient Time For Goal Achievement: 12/01/15 Potential to Achieve Goals: Good Additional Goals Additional Goal #1: Pt indep with donning LSO brace and 100% compliant with precautions ie. not wearing it while in bed.    Frequency Min 5X/week   Barriers to discharge Decreased caregiver support currently spouse with part time job    Co-evaluation               End of Session Equipment Utilized During Treatment: Gait belt;Back brace Activity Tolerance: Patient limited by pain Patient left: in bed;with call bell/phone within reach;with bed alarm set (on side with pillow between needs) Nurse Communication: Mobility status    Functional Assessment Tool Used: clinical judgement Functional Limitation: Mobility: Walking and moving around Mobility: Walking and Moving Around Current Status [id number]): At least 80 percent but less than 100 percent impaired, limited or restricted Mobility: Walking and Moving Around Goal Status [phone]): At least 1 percent but less than 20 percent impaired, limited or restricted    Time: 0932-0957 PT Time Calculation  (min) (ACUTE ONLY): 25 min   Charges:   PT Evaluation $PT Eval Moderate Complexity: 1 Procedure PT Treatments $Therapeutic Activity: 8-22 mins   PT G Codes:   PT G-Codes **NOT FOR INPATIENT CLASS** Functional Assessment Tool Used: clinical judgement Functional Limitation: Mobility: Walking and moving around Mobility: Walking and Moving Around Current Status [id number]): At least 80 percent but less than 100 percent impaired, limited or restricted Mobility: Walking and Moving Around Goal Status [phone]): At least 1 percent but less than 20 percent impaired, limited or restricted    Kingsley Callander 11/17/2015, 10:19 AM  Kittie Plater, PT, DPT Pager #: [phone] Office #: [phone]

## 2015-11-17 NOTE — Progress Notes (Signed)
Patient ID: Antoine Reddington, female   DOB: 11-May-1950, 65 y.o.   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 Patient presented to the emergency department on 11/9 after sustaining a fall at home. She complained of lumbar back pain.   Patient was hemodynamically stable in the emergency department. She was afebrile, heart rate in the 60s, an initial blood pressure of 118/75. Her BP was as low as 58/39. CT of the thoracic spine revealed T12 compression fracture and has a brace placed.  Assessment & Plan:   Active Problems:   Orthostatic hypotension - Unclear etiology  - 2 D ECHO is pending as of 11/11 - Cosyntropin test negative - Cardio following - Resume antihypertensives slowly  Fall / acute T12 compression fracture - Has a brace placed, will follow-up with neurosurgery on an outpatient basis  Depression - Stable mental status - Continue Cymbalta    DVT prophylaxis: Lovenox subQ Code Status: full code  Family Communication: spoke with pt husband over the phone [phone] Disposition Plan: to SNF per PT eval    Consultants:   Cardiology   PT  Procedures:   2 D ECHO pending    Antimicrobials:   None    Subjective: No overnight events.  Objective: Vitals:   11/16/15 2144 11/17/15 0122 11/17/15 0621 11/17/15 0923  BP: (!) 146/59 (!) 158/69 (!) 161/78 137/69  Pulse: 85 93 98 80  Resp: 18 20 20 16   Temp: 99.4 F (37.4 C) 98.6 F (37 C) 99.1 F (37.3 C) 99.3 F (37.4 C)  TempSrc: Oral Oral Oral Oral  SpO2: 93% 95% 93% 94%  Weight:      Height:       No intake or output data in the 24 hours ending 11/17/15 1242 Filed Weights   11/16/15 0014    Weight: 102.4 kg (225 lb 11.2 oz)    Examination:  General exam: Appears calm and comfortable, no distress    Respiratory system: No wheezing, no rhonchi  Cardiovascular system: S1 & S2 heard, Rate controlled  Gastrointestinal system: (+) BS, non tender  Central nervous system: No focal neurological deficits.  Extremities: No edema, palpable pulses  Skin: skin is warm and dry  Psychiatry: Normal mood and affect  Data Reviewed: I have personally reviewed following labs and imaging studies  CBC:  Recent Labs Lab 11/15/15 1400 11/15/15 1429 11/16/15 0110 11/16/15 0317  WBC 13.8*  --  12.7* 9.3  HGB 12.0 12.2 11.2* 10.9*  HCT 36.8 36.0 34.5* 34.1*  MCV 90.9  --  91.0 91.4  PLT 334  --  296 [id number]   Basic Metabolic Panel:  Recent Labs Lab 11/15/15 1400 11/15/15 1429 11/16/15 0110 11/16/15 0317  NA 139 141  --  142  K 4.3 4.3  --  3.6  CL 105 104  --  112*  CO2 24  --   --  21*  GLUCOSE 126* 123*  --  118*  BUN 10 11  --  9  CREATININE 0.87 0.90 0.78 0.66  CALCIUM 9.1  --   --  8.8*   GFR: Estimated Creatinine Clearance: 87.4 mL/min (by C-G formula based on SCr of 0.66 mg/dL). Liver Function Tests:  Recent Labs Lab 11/16/15 0317  AST  26  ALT 26  ALKPHOS 62  BILITOT 0.4  PROT 5.9*  ALBUMIN 3.3*   No results for input(s): LIPASE, AMYLASE in the last 168 hours. No results for input(s): AMMONIA in the last 168 hours. Coagulation Profile: No results for input(s): INR, PROTIME in the last 168 hours. Cardiac Enzymes: No results for input(s): CKTOTAL, CKMB, CKMBINDEX, TROPONINI in the last 168 hours. BNP (last 3 results) No results for input(s): PROBNP in the last 8760 hours. HbA1C: No results for input(s): HGBA1C in the last 72 hours. CBG: No results for input(s): GLUCAP in the last 168 hours. Lipid Profile: No results for input(s): CHOL, HDL, LDLCALC, TRIG, CHOLHDL, LDLDIRECT in the last 72 hours. Thyroid Function Tests: No results for input(s): TSH,  T4TOTAL, FREET4, T3FREE, THYROIDAB in the last 72 hours. Anemia Panel: No results for input(s): VITAMINB12, FOLATE, FERRITIN, TIBC, IRON, RETICCTPCT in the last 72 hours. Urine analysis:    Component Value Date/Time   COLORURINE YELLOW 11/15/2015 1838   APPEARANCEUR HAZY (A) 11/15/2015 1838   LABSPEC 1.024 11/15/2015 1838   PHURINE 5.5 11/15/2015 1838   GLUCOSEU NEGATIVE 11/15/2015 1838   HGBUR NEGATIVE 11/15/2015 1838   BILIRUBINUR SMALL (A) 11/15/2015 1838   KETONESUR 15 (A) 11/15/2015 1838   PROTEINUR 30 (A) 11/15/2015 1838   NITRITE NEGATIVE 11/15/2015 1838   LEUKOCYTESUR NEGATIVE 11/15/2015 1838   Sepsis Labs: @LABRCNTIP (procalcitonin:4,lacticidven:4)   )No results found for this or any previous visit (from the past 240 hour(s)).    Radiology Studies: Dg Chest 2 View Result Date: 11/15/2015 No active lung disease. No fracture is seen. The heart is upper normal in size.   Dg Lumbar Spine 2-3 Views Result Date: 11/15/2015 1. Suspect acute compression fracture of the anterior superior aspect of T12 vertebral body. Consider CT or MRI if further assessment is warranted. 2. Degenerative disc disease at L5-S1   Dg Pelvis 1-2 Views Result Date: 11/15/2015 Negative.   Ct Thoracic Spine Wo Contrast Result Date: 11/15/2015  1. Acute appearing T12 compression fracture with minimal height loss. 2. Aortic atherosclerosis.     Scheduled Meds: . atenolol  25 mg Oral Daily  . DULoxetine  30 mg Oral Daily  . enoxaparin (LOVENOX) injection  40 mg Subcutaneous Daily  . losartan  25 mg Oral Daily   Continuous Infusions:   LOS: 0 days    Time spent: 15 minutes  Greater than 50% of the time spent on counseling and coordinating the care.   Leisa Lenz, MD [facility] Pager [phone]  If 7PM-7AM, please contact night-coverage [email or web address] Password [email or web address] 11/17/2015, 12:42 PM

## 2015-11-17 NOTE — Progress Notes (Signed)
Patient HR 145-160, BP 180/97, 93% room air. Telemetry notified this nurse of heart rhythm changing to afib.  No noted distress or complaints of pain or discomfort. Skin warm and dry, no shortness of breath. , patient does state she was on the phone with a family member who made her upset. Call to MD with new orders to begin low dose metoprolol 2.5mg  IV every 8 hours up to 5mg  as long as BP remains 120/80 or above with heart rate above 60. Cardiologist to review and see patient in the morning.  Will continue to monitor.

## 2015-11-17 NOTE — Progress Notes (Addendum)
    Patient Name: Shannon [name] Date of Encounter: 11/17/2015  Principal Problem:   Orthostatic hypotension Active Problems:   Fall   Thoracic compression fracture [facility])   Depression   Length of Stay: 0  SUBJECTIVE  Denies any dizziness or falls.   CURRENT MEDS . DULoxetine  30 mg Oral Daily  . enoxaparin (LOVENOX) injection  40 mg Subcutaneous Daily   OBJECTIVE  Vitals:   11/16/15 1802 11/16/15 2144 11/17/15 0122 11/17/15 0621  BP: (!) 143/64 (!) 146/59 (!) 158/69 (!) 161/78  Pulse: 86 85 93 98  Resp: 20 18 20 20   Temp: 99.3 F (37.4 C) 99.4 F (37.4 C) 98.6 F (37 C) 99.1 F (37.3 C)  TempSrc: Oral Oral Oral Oral  SpO2: 93% 93% 95% 93%  Weight:      Height:       No intake or output data in the 24 hours ending 11/17/15 0842 Filed Weights   11/16/15 0014  Weight: 225 lb 11.2 oz (102.4 kg)    PHYSICAL EXAM  General: Pleasant, NAD. Neuro: Alert and oriented X 3. Moves all extremities spontaneously. Psych: Normal affect. HEENT:  Normal  Neck: Supple without bruits or JVD. Lungs:  Resp regular and unlabored, CTA. Heart: RRR no s3, s4, or murmurs. Abdomen: Soft, non-tender, non-distended, BS + x 4.  Extremities: No clubbing, cyanosis or edema. DP/PT/Radials 2+ and equal bilaterally.  Accessory Clinical Findings  CBC  Recent Labs  11/16/15 0110 11/16/15 0317  WBC 12.7* 9.3  HGB 11.2* 10.9*  HCT 34.5* 34.1*  MCV 91.0 91.4  PLT 296 [id number]   Basic Metabolic Panel  Recent Labs  11/15/15 1400 11/15/15 1429 11/16/15 0110 11/16/15 0317  NA 139 141  --  142  K 4.3 4.3  --  3.6  CL 105 104  --  112*  CO2 24  --   --  21*  GLUCOSE 126* 123*  --  118*  BUN 10 11  --  9  CREATININE 0.87 0.90 0.78 0.66  CALCIUM 9.1  --   --  8.8*   Liver Function Tests  Recent Labs  11/16/15 0317  AST 26  ALT 26  ALKPHOS 62  BILITOT 0.4  PROT 5.9*  ALBUMIN 3.3*   Radiology/Studies  Dg Chest 2 View  Result Date: 11/15/2015 CLINICAL DATA:  [place] today  with pain in the lower back EXAM: CHEST  2 VIEW COMPARISON:  None. FINDINGS: No active infiltrate or effusion is seen. Mediastinal and hilar contours are unremarkable. The heart is within upper limits normal. No acute bony abnormality is seen. IMPRESSION: No active lung disease. No fracture is seen. The heart is upper normal in size. Electronically Signed   By: Judeth Cornfield: SR, PVCs.    ASSESSMENT AND PLAN  1. Orthostatic hypotension - Likely due to multiple antihypertensive. She take all at night (Amlodipine 10mg  qd, atenolol 100mg  qd and valsartan 32mg  qd). She was positive for orthostatic in ER. S/p discontinuation of chronic prednisone therapy, negative cosyntropine test. I would slowly restart antihypertensives, start with losartan 25 mg po daily and atenolol 25 mg po daily.  We will start Pt& OT since she hasn't been out of bed since the admission.   2. Chronic diastolic dysfunction - She is euvolemic.   3. OSA on CPAP - Advised to loose weight.    Signed, Ena Dawley MD, [facility] 11/17/2015

## 2015-11-18 ENCOUNTER — Observation Stay (HOSPITAL_BASED_OUTPATIENT_CLINIC_OR_DEPARTMENT_OTHER): Payer: PRIVATE HEALTH INSURANCE

## 2015-11-18 ENCOUNTER — Other Ambulatory Visit (HOSPITAL_COMMUNITY): Payer: PRIVATE HEALTH INSURANCE

## 2015-11-18 ENCOUNTER — Encounter (HOSPITAL_COMMUNITY): Payer: Self-pay | Admitting: *Deleted

## 2015-11-18 DIAGNOSIS — Z5181 Encounter for therapeutic drug level monitoring: Secondary | ICD-10-CM

## 2015-11-18 DIAGNOSIS — M545 Low back pain: Secondary | ICD-10-CM | POA: Diagnosis present

## 2015-11-18 DIAGNOSIS — F329 Major depressive disorder, single episode, unspecified: Secondary | ICD-10-CM | POA: Diagnosis present

## 2015-11-18 DIAGNOSIS — I509 Heart failure, unspecified: Secondary | ICD-10-CM | POA: Diagnosis not present

## 2015-11-18 DIAGNOSIS — S22080A Wedge compression fracture of T11-T12 vertebra, initial encounter for closed fracture: Secondary | ICD-10-CM | POA: Diagnosis not present

## 2015-11-18 DIAGNOSIS — Y92009 Unspecified place in unspecified non-institutional (private) residence as the place of occurrence of the external cause: Secondary | ICD-10-CM | POA: Diagnosis not present

## 2015-11-18 DIAGNOSIS — F3289 Other specified depressive episodes: Secondary | ICD-10-CM | POA: Diagnosis not present

## 2015-11-18 DIAGNOSIS — I493 Ventricular premature depolarization: Secondary | ICD-10-CM | POA: Diagnosis present

## 2015-11-18 DIAGNOSIS — S22089A Unspecified fracture of T11-T12 vertebra, initial encounter for closed fracture: Secondary | ICD-10-CM | POA: Diagnosis present

## 2015-11-18 DIAGNOSIS — I951 Orthostatic hypotension: Secondary | ICD-10-CM | POA: Diagnosis present

## 2015-11-18 DIAGNOSIS — S22000S Wedge compression fracture of unspecified thoracic vertebra, sequela: Secondary | ICD-10-CM | POA: Diagnosis not present

## 2015-11-18 DIAGNOSIS — I4891 Unspecified atrial fibrillation: Secondary | ICD-10-CM | POA: Diagnosis present

## 2015-11-18 DIAGNOSIS — K219 Gastro-esophageal reflux disease without esophagitis: Secondary | ICD-10-CM | POA: Diagnosis present

## 2015-11-18 DIAGNOSIS — Z8249 Family history of ischemic heart disease and other diseases of the circulatory system: Secondary | ICD-10-CM | POA: Diagnosis not present

## 2015-11-18 DIAGNOSIS — G4733 Obstructive sleep apnea (adult) (pediatric): Secondary | ICD-10-CM | POA: Diagnosis present

## 2015-11-18 DIAGNOSIS — W19XXXS Unspecified fall, sequela: Secondary | ICD-10-CM | POA: Diagnosis not present

## 2015-11-18 DIAGNOSIS — Z7901 Long term (current) use of anticoagulants: Secondary | ICD-10-CM

## 2015-11-18 DIAGNOSIS — F411 Generalized anxiety disorder: Secondary | ICD-10-CM | POA: Diagnosis present

## 2015-11-18 DIAGNOSIS — G8929 Other chronic pain: Secondary | ICD-10-CM | POA: Diagnosis present

## 2015-11-18 DIAGNOSIS — I10 Essential (primary) hypertension: Secondary | ICD-10-CM | POA: Diagnosis present

## 2015-11-18 DIAGNOSIS — M8580 Other specified disorders of bone density and structure, unspecified site: Secondary | ICD-10-CM | POA: Diagnosis present

## 2015-11-18 DIAGNOSIS — E785 Hyperlipidemia, unspecified: Secondary | ICD-10-CM | POA: Diagnosis present

## 2015-11-18 DIAGNOSIS — W010XXA Fall on same level from slipping, tripping and stumbling without subsequent striking against object, initial encounter: Secondary | ICD-10-CM | POA: Diagnosis present

## 2015-11-18 DIAGNOSIS — Z87891 Personal history of nicotine dependence: Secondary | ICD-10-CM | POA: Diagnosis not present

## 2015-11-18 MED ORDER — METOPROLOL TARTRATE 50 MG PO TABS
50.0000 mg | ORAL_TABLET | Freq: Two times a day (BID) | ORAL | Status: DC
  Administered 2015-11-18 – 2015-11-19 (×3): 50 mg via ORAL
  Filled 2015-11-18 (×3): qty 1

## 2015-11-18 MED ORDER — APIXABAN 5 MG PO TABS
5.0000 mg | ORAL_TABLET | Freq: Two times a day (BID) | ORAL | Status: DC
  Administered 2015-11-18 – 2015-11-19 (×3): 5 mg via ORAL
  Filled 2015-11-18 (×3): qty 1

## 2015-11-18 MED ORDER — SENNA 8.6 MG PO TABS
1.0000 | ORAL_TABLET | Freq: Every day | ORAL | Status: DC
  Administered 2015-11-18 – 2015-11-19 (×2): 8.6 mg via ORAL
  Filled 2015-11-18 (×2): qty 1

## 2015-11-18 NOTE — Progress Notes (Signed)
Physical Therapy Treatment Patient Details Name: Shannon [name] MRN: [id number] DOB: 1950/03/06 Today's Date: 11/18/2015    History of Present Illness Ms. 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 Patient presented to the emergency department on 11/9 after sustaining a fall at home due to dog tripping her. CT revealed T12 compression fracture.Pt also know to be hypotensive in ED with BP in 50s.    PT Comments    Pt much improved today and was able to ambulate. If pt con't to progress and can achieve supervision level of function over the next couple of days pt would be able to d/c home with assist of spouse and HHPT. Pt does con't to become diaphoretic with c/o lightheadedness with mobility. See other comments for BP numbers. Acute PT to con't to follow.  Follow Up Recommendations  SNF     Equipment Recommendations  None recommended by PT    Recommendations for Other Services       Precautions / Restrictions Precautions Precautions: Fall Precaution Comments: T12 compression fracture Required Braces or Orthoses: Spinal Brace Spinal Brace: Lumbar corset Restrictions Weight Bearing Restrictions: No    Mobility  Bed Mobility Overal bed mobility: Needs Assistance Bed Mobility: Rolling;Sidelying to Sit Rolling: Min guard Sidelying to sit: Min assist       General bed mobility comments: pt required significant increase in time however was able to complete without physical assist. pt stopped 1/2 way through due to onset of pain, max enocuragement given  Transfers Overall transfer level: Needs assistance Equipment used: Rolling walker (2 wheeled) Transfers: Sit to/from Stand Sit to Stand: Min assist         General transfer comment: v/c's for hand placement, minA to stabilize pt during  transition of hands from bed to walker  Ambulation/Gait Ambulation/Gait assistance: Min assist;+2 safety/equipment (2nd person for chair follow) Ambulation Distance (Feet): 20 Feet (x1, 30x1) Assistive device: Rolling walker (2 wheeled) Gait Pattern/deviations: Step-through pattern;Decreased stride length Gait velocity: slow Gait velocity interpretation: Below normal speed for age/gender General Gait Details: slow and guarded, pt with report of onset of lightheadedness requiring pt to sit, pt dependent on UEs due to L LE pain   Stairs            Wheelchair Mobility    Modified Rankin (Stroke Patients Only)       Balance Overall balance assessment: Needs assistance Sitting-balance support: Feet supported;No upper extremity supported Sitting balance-Leahy Scale: Fair Sitting balance - Comments: unable to don back brace without assist   Standing balance support: Bilateral upper extremity supported Standing balance-Leahy Scale: Poor Standing balance comment: requies use of RW                    Cognition Arousal/Alertness: Awake/alert Behavior During Therapy: WFL for tasks assessed/performed Overall Cognitive Status: Within Functional Limits for tasks assessed                      Exercises      General Comments General comments (skin integrity, edema, etc.): pt became diapheretic during ambulation, BP intiially 140/69 in sitting, standing 117/68 and s/p amb 127/71.      Pertinent Vitals/Pain Pain Assessment: 0-10 Pain Score: 7  Pain Location: back and lhip Pain Descriptors / Indicators: Sharp Pain Intervention(s): Monitored during session    Home Living  Prior Function            PT Goals (current goals can now be found in the care plan section) Acute Rehab PT Goals Patient Stated Goal: home Progress towards PT goals: Progressing toward goals    Frequency    Min 5X/week      PT Plan Current plan  remains appropriate    Co-evaluation             End of Session Equipment Utilized During Treatment: Gait belt;Back brace Activity Tolerance: Patient tolerated treatment well Patient left: in chair;with call bell/phone within reach;with family/visitor present     Time: [id number] PT Time Calculation (min) (ACUTE ONLY): 32 min  Charges:  $Gait Training: 8-22 mins $Therapeutic Activity: 8-22 mins                    G Codes:      Kingsley Callander 11/18/2015, 1:44 PM   Kittie Plater, PT, DPT Pager #: [phone] Office #: [phone]

## 2015-11-18 NOTE — Clinical Social Work Placement (Signed)
   CLINICAL SOCIAL WORK PLACEMENT  NOTE  Date:  11/18/2015  Patient Details  Name: Shannon [name] MRN: [id number] Date of Birth: 23-Mar-1950  Clinical Social Work is seeking post-discharge placement for this patient at the [place] level of care (*CSW will initial, date and re-position this form in  chart as items are completed):  Yes   Patient/family provided with [facility] Work Department's list of facilities offering this level of care within the geographic area requested by the patient (or if unable, by the patient's family).  Yes   Patient/family informed of their freedom to choose among providers that offer the needed level of care, that participate in Medicare, Medicaid or managed care program needed by the patient, have an available bed and are willing to accept the patient.  Yes   Patient/family informed of [facility]'s ownership interest in [facility] and [facility], as well as of the fact that they are under no obligation to receive care at these facilities.  PASRR submitted to EDS on 11/18/15     PASRR number received on 11/18/15     Existing PASRR number confirmed on       FL2 transmitted to all facilities in geographic area requested by pt/family on 11/18/15     FL2 transmitted to all facilities within larger geographic area on       Patient informed that his/her managed care company has contracts with or will negotiate with certain facilities, including the following:            Patient/family informed of bed offers received.  Patient chooses bed at       Physician recommends and patient chooses bed at      Patient to be transferred to   on  .  Patient to be transferred to facility by       Patient family notified on   of transfer.  Name of family member notified:        PHYSICIAN Please sign FL2     Additional Comment:    _______________________________________________ Glendon Axe A 11/18/2015, 12:34  PM

## 2015-11-18 NOTE — NC FL2 (Signed)
[place] LEVEL OF CARE SCREENING TOOL     IDENTIFICATION  Patient Name: Shannon [name] Birthdate: 25-Oct-1950 Sex: female Admission Date (Current Location): 11/15/2015  [facility] and [place] Number:  Herbalist and Address:  [place], [place] [address], [place], [place] [address]      Provider Number: [id number]  Attending Physician Name and Address:  Robbie Lis, MD  Relative Name and Phone Number:       Current Level of Care: Hospital Recommended Level of Care: [place] Prior Approval Number:    Date Approved/Denied:   PASRR Number:   [id number]  Discharge Plan: SNF    Current Diagnoses: Patient Active Problem List   Diagnosis Date Noted  . New onset atrial fibrillation ([place])   . Fall 11/16/2015  . Thoracic compression fracture ([place]) 11/16/2015  . Depression 11/16/2015  . Orthostatic hypotension 11/15/2015    Orientation RESPIRATION BLADDER Height & Weight     Self, Time, Situation, Place  Normal Continent Weight: 225 lb 11.2 oz (102.4 kg) Height:  5\' 7"  (170.2 cm)  BEHAVIORAL SYMPTOMS/MOOD NEUROLOGICAL BOWEL NUTRITION STATUS   (none )  (none ) Continent Diet (Heart Healthy )  AMBULATORY STATUS COMMUNICATION OF NEEDS Skin   Limited Assist Verbally Normal                       Personal Care Assistance Level of Assistance  Bathing, Feeding, Dressing Bathing Assistance: Limited assistance Feeding assistance: Independent Dressing Assistance: Limited assistance     Functional Limitations Info  Sight, Hearing, Speech Sight Info: Adequate Hearing Info: Adequate Speech Info: Adequate    SPECIAL CARE FACTORS FREQUENCY  PT (By licensed PT)     PT Frequency: 5              Contractures      Additional Factors Info  Code Status, Allergies Code Status Info: FULL CODE  Allergies Info: Ace Inhibitors, Hctz Hydrochlorothiazide, Other, Oxycodone, Sulfa Antibiotics            Current Medications (11/18/2015):  This is the current hospital active medication list Current Facility-Administered Medications  Medication Dose Route Frequency Provider Last Rate Last Dose  . acetaminophen (TYLENOL) tablet 325-650 mg  325-650 mg Oral Q6H PRN Rexanne Mano, MD   650 mg at 11/16/15 1518  . apixaban (ELIQUIS) tablet 5 mg  5 mg Oral BID Dorothy Spark, MD   5 mg at 11/18/15 1006  . DULoxetine (CYMBALTA) DR capsule 30 mg  30 mg Oral Daily Rexanne Mano, MD   30 mg at 11/18/15 1007  . HYDROcodone-acetaminophen (NORCO/VICODIN) 5-325 MG per tablet 1-2 tablet  1-2 tablet Oral Q6H PRN Robbie Lis, MD   1 tablet at 11/18/15 0451  . losartan (COZAAR) tablet 25 mg  25 mg Oral Daily Dorothy Spark, MD   25 mg at 11/18/15 1007  . metoprolol (LOPRESSOR) tablet 50 mg  50 mg Oral BID Dorothy Spark, MD   50 mg at 11/18/15 1006  . senna (SENOKOT) tablet 8.6 mg  1 tablet Oral Daily Robbie Lis, MD      . traMADol Veatrice Bourbon) tablet 50 mg  50 mg Oral QHS PRN Rexanne Mano, MD   50 mg at 11/16/15 0108     Discharge Medications: Please see discharge summary for a list of discharge medications.  Relevant Imaging Results:  Relevant Lab Results:   Additional Information SSN  [id number]  [name]

## 2015-11-18 NOTE — Progress Notes (Signed)
Patient Name: Shannon [name] Date of Encounter: 11/18/2015  Principal Problem:   Orthostatic hypotension Active Problems:   Fall   Thoracic compression fracture [facility])   Depression   Length of Stay: 0  SUBJECTIVE  Denies any dizziness or falls. She developed rapid heart rate the last night at 6 ppm that felt like anxiety attack.   CURRENT MEDS . atenolol  25 mg Oral Daily  . DULoxetine  30 mg Oral Daily  . enoxaparin (LOVENOX) injection  40 mg Subcutaneous Daily  . losartan  25 mg Oral Daily   OBJECTIVE  Vitals:   11/17/15 1858 11/17/15 2133 11/18/15 0141 11/18/15 0517  BP:  132/90 129/79 131/82  Pulse: (!) 176 93 (!) 141 (!) 112  Resp:  18 18 18   Temp:  99.6 F (37.6 C) 99.9 F (37.7 C) 98.7 F (37.1 C)  TempSrc:  Oral Oral Oral  SpO2:  93% 94% 96%  Weight:      Height:       No intake or output data in the 24 hours ending 11/18/15 0810 Filed Weights   11/16/15 0014  Weight: 225 lb 11.2 oz (102.4 kg)    PHYSICAL EXAM  General: Pleasant, NAD. Neuro: Alert and oriented X 3. Moves all extremities spontaneously. Psych: Normal affect. HEENT:  Normal  Neck: Supple without bruits or JVD. Lungs:  Resp regular and unlabored, CTA. Heart: RRR no s3, s4, or murmurs. Abdomen: Soft, non-tender, non-distended, BS + x 4.  Extremities: No clubbing, cyanosis or edema. DP/PT/Radials 2+ and equal bilaterally.  Accessory Clinical Findings  CBC  Recent Labs  11/16/15 0110 11/16/15 0317  WBC 12.7* 9.3  HGB 11.2* 10.9*  HCT 34.5* 34.1*  MCV 91.0 91.4  PLT 296 [id number]   Basic Metabolic Panel  Recent Labs  11/15/15 1400 11/15/15 1429 11/16/15 0110 11/16/15 0317  NA 139 141  --  142  K 4.3 4.3  --  3.6  CL 105 104  --  112*  CO2 24  --   --  21*  GLUCOSE 126* 123*  --  118*  BUN 10 11  --  9  CREATININE 0.87 0.90 0.78 0.66  CALCIUM 9.1  --   --  8.8*   Liver Function Tests  Recent Labs  11/16/15 0317  AST 26  ALT 26  ALKPHOS 62  BILITOT 0.4  PROT  5.9*  ALBUMIN 3.3*   Radiology/Studies  Dg Chest 2 View  Result Date: 11/15/2015 CLINICAL DATA:  [place] today with pain in the lower back EXAM: CHEST  2 VIEW COMPARISON:  None. FINDINGS: No active infiltrate or effusion is seen. Mediastinal and hilar contours are unremarkable. The heart is within upper limits normal. No acute bony abnormality is seen. IMPRESSION: No active lung disease. No fracture is seen. The heart is upper normal in size. Electronically Signed   By: Judeth Cornfield: SR, PVCs.    ASSESSMENT AND PLAN  1. Atrial fibrillation with RVR - new diagnosis, HR up to 170 for 12 hours cardioverted spontaneously on its own, the patient states that she has had similar episodes in the past, she felt it was anxiety.  We will switch atenolol, to metoprolol 50 mg po BID, start Eliquis, she will be discharged to a skilled nursing facility. Echocardiogram is pending. She is a new patient of Dr Johnsie Cancel.  2. Orthostatic hypotension - Likely due to multiple antihypertensive. She take all at night (Amlodipine 10mg  qd, atenolol 100mg  qd and valsartan  32mg  qd). She was positive for orthostatic in ER. S/p discontinuation of chronic prednisone therapy, negative cosyntropine test. I would slowly restart antihypertensives, started with losartan 25 mg po daily. Now adding metoprolol 50 mg po BID.   3. Chronic diastolic dysfunction - She is euvolemic.   4. OSA on CPAP - Advised to loose weight.    Signed, Ena Dawley MD, [facility] 11/18/2015

## 2015-11-18 NOTE — Progress Notes (Signed)
Patient continues to be in Afib with heart rate 140's -160's. Md notified. Order given for one time dose of Metoprolol 2.5 mg IV.  Will continue to monitor.

## 2015-11-18 NOTE — Progress Notes (Signed)
*  PRELIMINARY RESULTS* Echocardiogram 2D Echocardiogram has been performed.  Leavy Cella 11/18/2015, 2:12 PM

## 2015-11-18 NOTE — Clinical Social Work Note (Signed)
Clinical Social Work Assessment  Patient Details  Name: Shannon [name] MRN: [id number] Date of Birth: 1950-11-09  Date of referral:  11/18/15               Reason for consult:  Facility Placement, Discharge Planning                Permission sought to share information with:  Case Manager, Family Supports, Customer service manager Permission granted to share information::  Yes, Verbal Permission Granted  Name::      [name] Yablonski )  Agency::   (SNF's )  Relationship::   (Spouse )  Contact Information:   [phone])  Housing/Transportation Living arrangements for the past 2 months:  [place] of Information:  Patient, Spouse Patient Interpreter Needed:  None Criminal Activity/Legal Involvement Pertinent to Current Situation/Hospitalization:  No - Comment as needed Significant Relationships:  Spouse Lives with:  Spouse Do you feel safe going back to the place where you live?  Yes Need for family participation in patient care:  Yes (Comment)  Care giving concerns:  Patient admitted from home with husband.   Social Worker assessment / plan:  MSW met with patient and husband at length in reference to post-acute placement for SNF. MSW introduced MSW role and SNF process. MSW reviewed and provided SNF list. Patient husband disclosed that patient does NOT have insurance. Patient and husband reported possibility of paying privately at [facility]. MSW encouraged patient and husband to review SNF's on Medicare.gov and choose at least 3 facilities to make inquiries for private pay rates. MSW explored possibility of Letter of Guarantee in the event patient and family unable to pay privately and if approved by CSW AD.   PT contacted MSW in regards to possibility of patient being able to return home. PT stated patient has made some improves and will determine final recommendation after patient works with PT on Monday, 11/13. No further concerns reported at this time. MSW remains  available as needed.   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 Patient and husband agreeable to SNF placement and prefers [employer] area. Husband supportive and strongly involved in care. Pt and husband pleasant and appreciated social work intervention.   Patient/Family's Understanding of and Emotional Response to Diagnosis, Current Treatment, and Prognosis:  Pt and husband knowledgeable of medical interventions.   Emotional Assessment Appearance:  Appears stated age Attitude/Demeanor/Rapport:   (Pleasant ) Affect (typically observed):  Accepting, Calm, Pleasant Orientation:  Oriented to Situation, Oriented to  Time, Oriented to Place, Oriented to Self Alcohol / Substance use:  Not Applicable Psych involvement (Current and /or in the community):  No (Comment)  Discharge Needs  Concerns to be addressed:  Care Coordination Readmission within the last 30 days:  No Current discharge risk:  Dependent with Mobility Barriers to Discharge:  Continued Medical Work up, Inadequate or no insurance   [place], [place] A 11/18/2015, 12:26 PM

## 2015-11-18 NOTE — Progress Notes (Signed)
Patient ID: Shannon [name], female   DOB: 1950-04-20, 65 y.o.   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 Patient presented to the emergency department on 11/9 after sustaining a fall at home. She complained of lumbar back pain.   Patient was hemodynamically stable in the emergency department. She was afebrile, heart rate in the 60s, an initial blood pressure of 118/75. Her BP was as low as 58/39. CT of the thoracic spine revealed T12 compression fracture and has a brace placed.  Assessment & Plan:   Active Problems:   Orthostatic hypotension - Unclear etiology  - 2 D ECHO is pending as of 11/12 - Cosyntropin test negative - Per cardio, hypotension likely from antihypertensives so recommendation was to resume BP meds slowly  - Started losartan 25 mg daily and metoprolol 50 mg PO BID - Cardio following  Atrial fibrillation, new onset  - CHADS vasc score 2 - Per cardio, will need AC, likely eliquis  - Rate controlled with metoprolol - Saponaceously converted to sinus rhythm   Fall / acute T12 compression fracture - Has a brace placed, will follow-up with neurosurgery on an outpatient basis  Depression - Stable mental status - Continue Cymbalta    DVT prophylaxis: On apixaban  Code Status: full code  Family Communication: spoke with pt husband over the phone [phone] Disposition Plan: to SNF per PT eval    Consultants:   Cardiology   PT  Procedures:   2 D ECHO pending    Antimicrobials:   None    Subjective: No overnight events.  Objective: Vitals:   11/17/15 2133 11/18/15 0141 11/18/15 0517 11/18/15 1039  BP: 132/90 129/79 131/82 115/69    Pulse: 93 (!) 141 (!) 112 89  Resp: 18 18 18 20   Temp: 99.6 F (37.6 C) 99.9 F (37.7 C) 98.7 F (37.1 C) 99.4 F (37.4 C)  TempSrc: Oral Oral Oral Oral  SpO2: 93% 94% 96% 94%  Weight:      Height:       No intake or output data in the 24 hours ending 11/18/15 1228 Filed Weights   11/16/15 0014  Weight: 102.4 kg (225 lb 11.2 oz)    Examination:  General exam: no distress    Respiratory system: No wheezing, no rhonchi, bilateral air entry  Cardiovascular system: S1 & S2 heard, rate controlled  Gastrointestinal system: (+) BS, non tender  Central nervous system: Non focal  Extremities: No edema, palpable pulses  Skin: no lesions or ulcers  Psychiatry: Normal mood and affect, not agitated or restless   Data Reviewed: I have personally reviewed following labs and imaging studies  CBC:  Recent Labs Lab 11/15/15 1400 11/15/15 1429 11/16/15 0110 11/16/15 0317  WBC 13.8*  --  12.7* 9.3  HGB 12.0 12.2 11.2* 10.9*  HCT 36.8 36.0 34.5* 34.1*  MCV 90.9  --  91.0 91.4  PLT 334  --  296 [id number]   Basic Metabolic Panel:  Recent Labs Lab 11/15/15 1400 11/15/15 1429 11/16/15 0110 11/16/15 0317  NA 139 141  --  142  K 4.3 4.3  --  3.6  CL 105 104  --  112*  CO2 24  --   --  21*  GLUCOSE 126*  123*  --  118*  BUN 10 11  --  9  CREATININE 0.87 0.90 0.78 0.66  CALCIUM 9.1  --   --  8.8*   GFR: Estimated Creatinine Clearance: 87.4 mL/min (by C-G formula based on SCr of 0.66 mg/dL). Liver Function Tests:  Recent Labs Lab 11/16/15 0317  AST 26  ALT 26  ALKPHOS 62  BILITOT 0.4  PROT 5.9*  ALBUMIN 3.3*   No results for input(s): LIPASE, AMYLASE in the last 168 hours. No results for input(s): AMMONIA in the last 168 hours. Coagulation Profile: No results for input(s): INR, PROTIME in the last 168 hours. Cardiac Enzymes: No results for input(s): CKTOTAL, CKMB, CKMBINDEX, TROPONINI in the last 168 hours. BNP (last 3 results) No results for input(s): PROBNP in the  last 8760 hours. HbA1C: No results for input(s): HGBA1C in the last 72 hours. CBG: No results for input(s): GLUCAP in the last 168 hours. Lipid Profile: No results for input(s): CHOL, HDL, LDLCALC, TRIG, CHOLHDL, LDLDIRECT in the last 72 hours. Thyroid Function Tests: No results for input(s): TSH, T4TOTAL, FREET4, T3FREE, THYROIDAB in the last 72 hours. Anemia Panel: No results for input(s): VITAMINB12, FOLATE, FERRITIN, TIBC, IRON, RETICCTPCT in the last 72 hours. Urine analysis:    Component Value Date/Time   COLORURINE YELLOW 11/15/2015 1838   APPEARANCEUR HAZY (A) 11/15/2015 1838   LABSPEC 1.024 11/15/2015 1838   PHURINE 5.5 11/15/2015 1838   GLUCOSEU NEGATIVE 11/15/2015 1838   HGBUR NEGATIVE 11/15/2015 1838   BILIRUBINUR SMALL (A) 11/15/2015 1838   KETONESUR 15 (A) 11/15/2015 1838   PROTEINUR 30 (A) 11/15/2015 1838   NITRITE NEGATIVE 11/15/2015 1838   LEUKOCYTESUR NEGATIVE 11/15/2015 1838   Sepsis Labs: @LABRCNTIP (procalcitonin:4,lacticidven:4)   )No results found for this or any previous visit (from the past 240 hour(s)).    Radiology Studies: Dg Chest 2 View Result Date: 11/15/2015 No active lung disease. No fracture is seen. The heart is upper normal in size.   Dg Lumbar Spine 2-3 Views Result Date: 11/15/2015 1. Suspect acute compression fracture of the anterior superior aspect of T12 vertebral body. Consider CT or MRI if further assessment is warranted. 2. Degenerative disc disease at L5-S1   Dg Pelvis 1-2 Views Result Date: 11/15/2015 Negative.   Ct Thoracic Spine Wo Contrast Result Date: 11/15/2015  1. Acute appearing T12 compression fracture with minimal height loss. 2. Aortic atherosclerosis.     Scheduled Meds: . apixaban  5 mg Oral BID  . DULoxetine  30 mg Oral Daily  . losartan  25 mg Oral Daily  . metoprolol tartrate  50 mg Oral BID  . senna  1 tablet Oral Daily   Continuous Infusions:   LOS: 0 days    Time spent: 25 minutes  Greater than  50% of the time spent on counseling and coordinating the care.   Leisa Lenz, MD [facility] Pager [phone]  If 7PM-7AM, please contact night-coverage [email or web address] Password [facility] 11/18/2015, 12:28 PM

## 2015-11-19 ENCOUNTER — Encounter (HOSPITAL_COMMUNITY): Payer: Self-pay | Admitting: *Deleted

## 2015-11-19 LAB — CBC
HEMATOCRIT: 36.2 % (ref 36.0–46.0)
HEMOGLOBIN: 11.7 g/dL — AB (ref 12.0–15.0)
MCH: 29.2 pg (ref 26.0–34.0)
MCHC: 32.3 g/dL (ref 30.0–36.0)
MCV: 90.3 fL (ref 78.0–100.0)
Platelets: 335 10*3/uL (ref 150–400)
RBC: 4.01 MIL/uL (ref 3.87–5.11)
RDW: 13.7 % (ref 11.5–15.5)
WBC: 8.2 10*3/uL (ref 4.0–10.5)

## 2015-11-19 MED ORDER — APIXABAN 5 MG PO TABS: 5.0000 mg | ORAL_TABLET | Freq: Two times a day (BID) | ORAL | 0 refills | Status: DC

## 2015-11-19 MED ORDER — POLYETHYLENE GLYCOL 3350 17 G PO PACK
17.0000 g | PACK | Freq: Two times a day (BID) | ORAL | Status: DC
  Administered 2015-11-19: 17 g via ORAL
  Filled 2015-11-19: qty 1

## 2015-11-19 MED ORDER — METOPROLOL TARTRATE 50 MG PO TABS: 50.0000 mg | ORAL_TABLET | Freq: Two times a day (BID) | ORAL | 0 refills | Status: DC

## 2015-11-19 MED ORDER — LOSARTAN POTASSIUM 25 MG PO TABS: 25.0000 mg | ORAL_TABLET | Freq: Every day | ORAL | 0 refills | Status: DC

## 2015-11-19 NOTE — Progress Notes (Signed)
Patient is discharged from room 5C07 at this time. Alert and in stable condition. IV site d/c'd as well as tele. Instructions read to patient with understanding verbalized. Patient stated she has already filled out her prescription for her pain medication. Left unit via wheelchair with husband and all belongings at side.

## 2015-11-19 NOTE — Evaluation (Signed)
Occupational Therapy Evaluation Patient Details Name: Shannon [name] MRN: [id number] DOB: 22-Aug-1950 Today's Date: 11/19/2015    History of Present Illness Ms. 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 Patient presented to the emergency department on 11/9 after sustaining a fall at home due to dog tripping her. CT revealed T12 compression fracture.Pt also know to be hypotensive in ED with BP in 50s.   Clinical Impression   This 65 yo female admitted with above presents to acute OT with all education completed with pt and family. We will sign off. Also called CM to let her know that we and PT had changed our D/C plan for pt to HHOT/PT and pt needs a 3n1 and tub seat.    Follow Up Recommendations  Home health OT;Supervision/Assistance - 24 hour;Other (comment) (24 hour S until family feel she is safe to leave alone)    Equipment Recommendations  3 in 1 bedside comode;Tub/shower seat;Other (comment) (they are aware that tub seat is an out of pocket expense)       Precautions / Restrictions Precautions Precautions: Fall (slipped on wet ground while walking her dog) Precaution Comments: T12 compression fracture Required Braces or Orthoses: Spinal Brace Spinal Brace: Lumbar corset;Applied in sitting position Restrictions Weight Bearing Restrictions: No      Mobility Bed Mobility               General bed mobility comments: Pt up in recliner upon arrival  Transfers Overall transfer level: Needs assistance Equipment used: Rolling walker (2 wheeled) Transfers: Sit to/from Stand Sit to Stand: Min guard         General transfer comment: v/c's for hand placement         ADL Overall ADL's : Needs assistance/impaired Eating/Feeding: Independent;Sitting   Grooming: Set  up;Supervision/safety;Standing   Upper Body Bathing: Set up;Supervision/ safety;Sitting   Lower Body Bathing: Min guard;Sit to/from stand   Upper Body Dressing : Set up;Supervision/safety;Sitting   Lower Body Dressing: Min guard;Sit to/from stand   Toilet Transfer: Min guard;Ambulation;RW;BSC (over toilet)   Toileting- Clothing Manipulation and Hygiene: Min guard;Sit to/from stand   Tub/ Shower Transfer: Min guard;Ambulation;Rolling walker;Shower Scientist, research (medical) Details (indicate cue type and reason): side step with husband A'ing with holding RW                   Pertinent Vitals/Pain Pain Assessment: 0-10 Pain Score: 2  Pain Location: left flank area Pain Descriptors / Indicators: Aching;Sore Pain Intervention(s): Monitored during session     Hand Dominance Right   Extremity/Trunk Assessment Upper Extremity Assessment Upper Extremity Assessment: Overall WFL for tasks assessed           Communication Communication Communication: No difficulties   Cognition Arousal/Alertness: Awake/alert Behavior During Therapy: WFL for tasks assessed/performed Overall Cognitive Status: Within Functional Limits for tasks assessed                                Home Living Family/patient expects to be discharged to:: Private residence Living Arrangements: Spouse/significant other Available Help at Discharge: Family;Available PRN/intermittently (pt reports husband can quit part time job if needed)) Type of Home: Apartment Home Access: Level entry (but long walk from car)     Home Layout: One level     Bathroom Shower/Tub: Tub/shower unit;Curtain Shower/tub characteristics: Curtain  Bathroom Toilet: Handicapped height Bathroom Accessibility: Yes   Home Equipment: Walker - 2 wheels;Cane - single point;Grab bars - tub/shower          Prior Functioning/Environment Level of Independence: Independent        Comments: had been walking         OT Problem List: Decreased strength;Impaired balance (sitting and/or standing);Pain      OT Goals(Current goals can be found in the care plan section) Acute Rehab OT Goals Patient Stated Goal: to go home  OT Frequency:                End of Session Equipment Utilized During Treatment: Rolling walker  Activity Tolerance: Patient tolerated treatment well Patient left:  (with PT walking back to room)   Time: 1415 ("dove tail" session with PT)-1435 OT Time Calculation (min): 20 min Charges:  OT General Charges $OT Visit: 1 Procedure OT Evaluation $OT Eval Moderate Complexity: 1 Procedure  Almon Register [id number] 11/19/2015, 2:55 PM

## 2015-11-19 NOTE — Discharge Instructions (Signed)

## 2015-11-19 NOTE — Progress Notes (Signed)
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 Patient presented to the emergency department on 11/9 after sustaining a fall at home due to dog tripping her. CT revealed T12 compression fracture.Pt also know to be hypotensive in ED with BP in 50s.    PT Comments    Patient with improved tolerance for upright activity (pain and BP). Patient mobilizing well enough to now discharge home with HHPT. Pt/husband in agreement. Educated in way to pad/modify their recliner to support her back. Educated husband that pt does not need a lift chair or wheelchair. No further questions.   Follow Up Recommendations  Home health PT;Supervision for mobility/OOB     Equipment Recommendations  None recommended by PT (Patient received RW in ED)    Recommendations for Other Services       Precautions / Restrictions Precautions Precautions: Fall Precaution Comments: T12 compression fracture Required Braces or Orthoses: Spinal Brace Spinal Brace: Lumbar corset;Applied in sitting position Restrictions Weight Bearing Restrictions: No    Mobility  Bed Mobility               General bed mobility comments: Reports she practiced with OT. Verbally described correct technique  Transfers Overall transfer level: Needs assistance Equipment used: Rolling walker (2 wheeled) Transfers: Sit to/from Stand Sit to Stand: Min guard         General transfer comment: v/c's for hand placement  Ambulation/Gait Ambulation/Gait assistance: Min guard;Supervision Ambulation Distance (Feet): 150 Feet (seated rest; 150 ft again) Assistive device: Rolling walker (2 wheeled) Gait  Pattern/deviations: Step-through pattern;Decreased stride length Gait velocity: slow   General Gait Details: slow and guarded; steady; educated on proper use of RW   Stairs            Wheelchair Mobility    Modified Rankin (Stroke Patients Only)       Balance           Standing balance support: Bilateral upper extremity supported Standing balance-Leahy Scale: Poor                      Cognition Arousal/Alertness: Awake/alert Behavior During Therapy: WFL for tasks assessed/performed Overall Cognitive Status: Within Functional Limits for tasks assessed                      Exercises      General Comments General comments (skin integrity, edema, etc.): husband present for education. Dovetailed session with OT       Pertinent Vitals/Pain Pain Assessment: 0-10 Pain Score: 4  Pain Location: back; left side Pain Descriptors / Indicators: Discomfort;Nagging Pain Intervention(s): Limited activity within patient's tolerance;Monitored during session;Repositioned    Home Living Family/patient expects to be discharged to:: Private residence Living Arrangements: Spouse/significant other Available Help at Discharge: Family;Available PRN/intermittently (pt reports husband can quit part time job if needed)) Type of Home: [place] Access: Level entry (but long walk from car)   Home Layout: One [place]: [place] - 2 wheels;Cane - single point;Grab bars - tub/shower      Prior Function Level of Independence: Independent      Comments: had been walking   PT Goals (current goals can now be found in the care plan section) Acute Rehab  PT Goals Patient Stated Goal: home Time For Goal Achievement: 12/01/15 Progress towards PT goals: Progressing toward goals    Frequency    Min 5X/week      PT Plan Discharge plan needs to be updated    Co-evaluation             End of Session Equipment Utilized During Treatment: Gait  belt;Back brace Activity Tolerance: Patient tolerated treatment well Patient left: in chair;with call bell/phone within reach;with family/visitor present     Time: 1440-1450 PT Time Calculation (min) (ACUTE ONLY): 10 min  Charges:  $Gait Training: 8-22 mins                    G Codes:      Virgilio Broadhead Dec 14, 2015, 3:14 PM Pager [phone]

## 2015-11-19 NOTE — Clinical Social Work Note (Signed)
CSW met with pt and daughter to provide bed offers for SNF. CSW also explained options for private pay as pt may not have a SNF benefit with her insurance.  PT changed recommendations to HHPT. CSW updated RNCM. CSW is signing off as no further needs identified. Pt is for discharge today.   Darden Dates, MSW, LCSW  Clinical Social Worker  [phone]

## 2015-11-19 NOTE — Discharge Summary (Signed)
Physician Discharge Summary  Shannon [name] [id number] DOB: 1950/12/21 DOA: 11/15/2015  PCP: PROVIDER NOT IN SYSTEM  Admit date: 11/15/2015 Discharge date: 11/19/2015  Recommendations for Outpatient Follow-up:  Continue apixaban as prescribed twice a day Continue metoprolol and losartan  Discharge Diagnoses:  Principal Problem:   Orthostatic hypotension Active Problems:   Fall   Thoracic compression fracture [facility])   Depression   New onset atrial fibrillation ([place])    Discharge Condition: stable   Diet recommendation: as tolerated   History of present illness:  65 year old female with past medical history significant for essential hypertension, suspected heart failure with reduced ejection fraction, 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 Patient presented to the emergency department on 11/9 after sustaining a fall at home. She complained of lumbar back pain.   Patient was hemodynamically stable in the emergency department. She was afebrile, heart rate in the 60s, an initial blood pressure of 118/75. Her BP was as low as 58/39. CT of the thoracic spine revealed T12 compression fracture and has a brace placed.  Hospital Course:    Assessment & Plan:   Active Problems:   Orthostatic hypotension - Unclear etiology  - 2 D ECHO showed EF 55% - Cosyntropin test negative - Per cardio, hypotension likely from antihypertensives so recommendation was to resume BP meds slowly  - Started losartan 25 mg daily and metoprolol   Atrial fibrillation, new onset  - CHADS vasc score 2 - Per cardio, continue apixaba - Rate controlled with metoprolol - Saponaceously converted to sinus rhythm   Fall / acute T12 compression fracture - Has a brace placed, will follow-up with neurosurgery on an outpatient basis  Depression - Stable mental status - Continue Cymbalta    DVT prophylaxis: On apixaban  Code Status: full code  Family Communication:  spoke with pt husband over the phone [phone]    Consultants:   Cardiology   PT  Procedures:   2 D ECHO - EF 55%  Antimicrobials:   None     Signed:  Leisa Lenz, MD  [facility] 11/19/2015, 3:34 PM  Pager #: [phone]  Time spent in minutes: less than 30 minutes    Discharge Exam: Vitals:   11/19/15 0922 11/19/15 1406  BP: 121/69 (!) 143/67  Pulse: 71 65  Resp: 16 16  Temp: 97.9 F (36.6 C) 98.2 F (36.8 C)   Vitals:   11/19/15 0616 11/19/15 0647 11/19/15 0922 11/19/15 1406  BP: (!) 164/70 (!) 115/48 121/69 (!) 143/67  Pulse: 66 66 71 65  Resp: 16 16 16 16   Temp: 98 F (36.7 C) 98 F (36.7 C) 97.9 F (36.6 C) 98.2 F (36.8 C)  TempSrc: Oral Oral Oral Oral  SpO2: 97% 97% 96% 95%  Weight:      Height:        General: Pt is alert, follows commands appropriately, not in acute distress Cardiovascular: Regular rate and rhythm, S1/S2 +, no murmurs Respiratory: Clear to auscultation bilaterally, no wheezing, no crackles, no rhonchi Abdominal: Soft, non tender, non distended, bowel sounds +, no guarding Extremities: no edema, no cyanosis, pulses palpable bilaterally DP and PT; lumbar brace (+) Neuro: Grossly nonfocal  Discharge Instructions  Discharge Instructions    Call MD for:  difficulty breathing, headache or visual disturbances    Complete by:  As directed    Call MD for:  redness, tenderness, or signs of infection (pain, swelling, redness, odor or green/yellow discharge around incision site)    Complete by:  As directed    Call MD for:  severe uncontrolled pain    Complete by:  As directed    Diet - low sodium heart healthy    Complete by:  As directed    Discharge instructions    Complete by:  As directed    Continue apixaban as prescribed twice a day Continue metoprolol and losartan   Increase activity slowly    Complete by:  As directed        Medication List    STOP taking these medications   ALEVE 220 MG  tablet Generic drug:  naproxen sodium   amLODipine 10 MG tablet Commonly known as:  NORVASC   atenolol 100 MG tablet Commonly known as:  TENORMIN   CALCIUM + D3 PO   valsartan 320 MG tablet Commonly known as:  DIOVAN     TAKE these medications   acetaminophen 325 MG tablet Commonly known as:  TYLENOL Take 325-650 mg by mouth every 6 (six) hours as needed for mild pain.   apixaban 5 MG Tabs tablet Commonly known as:  ELIQUIS Take 1 tablet (5 mg total) by mouth 2 (two) times daily.   DULoxetine 30 MG capsule Commonly known as:  CYMBALTA Take 30 mg by mouth daily.   HYDROcodone-acetaminophen 5-325 MG tablet Commonly known as:  NORCO/VICODIN Take 1-2 tablets by mouth every 6 (six) hours as needed for severe pain.   losartan 25 MG tablet Commonly known as:  COZAAR Take 1 tablet (25 mg total) by mouth daily. Start taking on:  11/20/2015   metoprolol 50 MG tablet Commonly known as:  LOPRESSOR Take 1 tablet (50 mg total) by mouth 2 (two) times daily.   traMADol 50 MG tablet Commonly known as:  ULTRAM Take 50 mg by mouth at bedtime as needed (for pain).   Vitamin D (Ergocalciferol) 50000 units Caps capsule Commonly known as:  DRISDOL Take 50,000 Units by mouth every 30 (thirty) days.   VITAMIN D-3 PO Take 1 capsule by mouth at bedtime.      Follow-up Information    CABBELL,KYLE L, MD Follow up in 1 week(s).   Specialty:  Neurosurgery Contact information: [address] [place] [address] [facility] Follow up.   Why:  [place] boy information: [place] [address] [phone]        [facility] Follow up.   Why:  Home Health Phycial Therapy and Occupational Therapy Contact information: [place] [address] [phone]        [facility] Follow up.   Specialty:   Rehabilitation Why:  Outpatient Physical Therapy and Occupational Therapy Contact information: [place] [id number] [address] [place] [phone]       Charlie Pitter, PA-C Follow up on 12/05/2015.   Specialties:  Cardiology, Radiology Why:  at 3:00 pm for follow up.  Contact information: [address] [place] [address] [phone]            The results of significant diagnostics from this hospitalization (including imaging, microbiology, ancillary and laboratory) are listed below for reference.    Significant Diagnostic Studies: Dg Chest 2 View  Result Date: 11/15/2015 CLINICAL DATA:  [place] today with pain in the lower back EXAM: CHEST  2 VIEW COMPARISON:  None. FINDINGS: No active infiltrate or  effusion is seen. Mediastinal and hilar contours are unremarkable. The heart is within upper limits normal. No acute bony abnormality is seen. IMPRESSION: No active lung disease. No fracture is seen. The heart is upper normal in size. Electronically Signed   By: Ivar Drape M.D.   On: 11/15/2015 15:12   Dg Lumbar Spine 2-3 Views  Result Date: 11/15/2015 CLINICAL DATA:  [place] today with pain in the back EXAM: LUMBAR SPINE - 2-3 VIEW COMPARISON:  None. FINDINGS: The lumbar vertebrae are in normal alignment. Intervertebral disc spaces appear normal with the exception of degenerative disc disease at L5-S1. However, there does appear to be a partial compression deformity of the anterior superior aspect of T12 vertebral body most likely acute. No retropulsion is seen. CT or MRI could be useful for further evaluation if indicated. IMPRESSION: 1. Suspect acute compression fracture of the anterior superior aspect of T12 vertebral body. Consider CT or MRI if further assessment is warranted. 2. Degenerative disc disease at L5-S1 Electronically Signed   By: Ivar Drape M.D.   On: 11/15/2015 15:14   Dg Pelvis 1-2 Views  Result Date:  11/15/2015 CLINICAL DATA:  [place] today with pain in the low back EXAM: PELVIS - 1-2 VIEW COMPARISON:  None. FINDINGS: Both hips are normal position with normal hip joint spaces. The pelvic rami are intact. The SI joints are corticated. The sacral foramina appear normal. No acute fracture is seen. IMPRESSION: Negative. Electronically Signed   By: Ivar Drape M.D.   On: 11/15/2015 15:15   Ct Thoracic Spine Wo Contrast  Result Date: 11/15/2015 CLINICAL DATA:  Fall today. Back pain. T12 compression fracture on radiographs. Initial encounter. EXAM: CT THORACIC SPINE WITHOUT CONTRAST TECHNIQUE: Multidetector CT imaging of the thoracic spine was performed without intravenous contrast administration. Multiplanar CT image reconstructions were also generated. COMPARISON:  Chest and lumbar spine radiographs earlier today FINDINGS: Alignment: No subluxation. Vertebrae: As seen on earlier radiographs, there is a fracture of T12 involving the superior endplate and anterior vertebral body with minimal vertebral body height loss predominantly on the left. There is no retropulsion or posterior element fracture identified. No additional thoracic spine fracture is identified. Paraspinal and other soft tissues: Aortic atherosclerosis without aneurysm. Small sliding hiatal hernia. Coronary artery atherosclerosis. Disc levels: Mild thoracic spondylosis. No evidence of significant spinal stenosis on this non myelographic examination. Mild to moderate left neural foraminal stenosis at T7-8 due to facet arthrosis. IMPRESSION: 1. Acute appearing T12 compression fracture with minimal height loss. 2. Aortic atherosclerosis. Electronically Signed   By: Logan Bores M.D.   On: 11/15/2015 16:50    Microbiology: No results found for this or any previous visit (from the past 240 hour(s)).   Labs: Basic Metabolic Panel:  Recent Labs Lab 11/15/15 1400 11/15/15 1429 11/16/15 0110 11/16/15 0317  NA 139 141  --  142  K 4.3 4.3  --   3.6  CL 105 104  --  112*  CO2 24  --   --  21*  GLUCOSE 126* 123*  --  118*  BUN 10 11  --  9  CREATININE 0.87 0.90 0.78 0.66  CALCIUM 9.1  --   --  8.8*   Liver Function Tests:  Recent Labs Lab 11/16/15 0317  AST 26  ALT 26  ALKPHOS 62  BILITOT 0.4  PROT 5.9*  ALBUMIN 3.3*   No results for input(s): LIPASE, AMYLASE in the last 168 hours. No results for input(s): AMMONIA in the last 168  hours. CBC:  Recent Labs Lab 11/15/15 1400 11/15/15 1429 11/16/15 0110 11/16/15 0317 11/19/15 0554  WBC 13.8*  --  12.7* 9.3 8.2  HGB 12.0 12.2 11.2* 10.9* 11.7*  HCT 36.8 36.0 34.5* 34.1* 36.2  MCV 90.9  --  91.0 91.4 90.3  PLT 334  --  296 262 335   Cardiac Enzymes: No results for input(s): CKTOTAL, CKMB, CKMBINDEX, TROPONINI in the last 168 hours. BNP: BNP (last 3 results) No results for input(s): BNP in the last 8760 hours.  ProBNP (last 3 results) No results for input(s): PROBNP in the last 8760 hours.  CBG: No results for input(s): GLUCAP in the last 168 hours.

## 2015-11-19 NOTE — Care Management Note (Addendum)
Case Management Note  Patient Details  Name: Shannon [name] MRN: [id number] Date of Birth: 03/23/1950  Subjective/Objective:                    Action/Plan: Pt discharging home with orders for [facility] services. CM met with the patient and provided her a list of [place] agencies in [place]. She selected [place]. Shannon [name] with [facility] notified. Pt does not have coverage for [facility] services but is interested in private pay for at least PT. She does not feel she needs [facility] RN and aide and may not want OT. Shannon [name] going to speak with her about the cost of the services. Pt also with recommendations for 3 in 1 and tub bench. Shannon [name] with [facility] notified and will deliver the 3 in 1 to the room. Pt is going to acquire the tub bench at another venue. No PCP listed. Pt states she sees Dr Raiford Noble. Information given to Shannon [name] at [facility]. Pt discharging home with Eliquis. CM provided her with the 30 day free card and the $10 co pay card with her commercial insurance.  Instructions on use of the cards given to pt and her husband. Bedside RN updated.   Expected Discharge Date:                  Expected Discharge Plan:  [place]  In-House Referral:     Discharge planning Services  CM Consult  Post Acute Care Choice:    Choice offered to:  Patient, Spouse  DME Arranged:  3-N-1 DME Agency:  [place]:  RN, PT, OT, Nurse's Aide (Pt refusing RN/ aide) [place] Agency:  [place]  Status of Service:  Completed, signed off  If discussed at [place] of Stay Meetings, dates discussed:    Additional Comments:  Pollie Friar, RN 11/19/2015, 4:13 PM

## 2015-11-19 NOTE — Progress Notes (Signed)
Patient Name: Shannon [name] Date of Encounter: 11/19/2015  Primary Cardiologist: Dr. Debbe Odea Problem List     Principal Problem:   Orthostatic hypotension Active Problems:   Fall   Thoracic compression fracture [facility])   Depression   New onset atrial fibrillation ([facility])     Subjective   No complaints today  Inpatient Medications    Scheduled Meds: . apixaban  5 mg Oral BID  . DULoxetine  30 mg Oral Daily  . losartan  25 mg Oral Daily  . metoprolol tartrate  50 mg Oral BID  . polyethylene glycol  17 g Oral BID  . senna  1 tablet Oral Daily   Continuous Infusions:  PRN Meds: acetaminophen, HYDROcodone-acetaminophen, traMADol   Vital Signs    Vitals:   11/19/15 0126 11/19/15 0616 11/19/15 0647 11/19/15 0922  BP: 138/64 (!) 164/70 (!) 115/48 121/69  Pulse: 62 66 66 71  Resp: 20 16 16 16   Temp: 98.2 F (36.8 C) 98 F (36.7 C) 98 F (36.7 C) 97.9 F (36.6 C)  TempSrc:  Oral Oral Oral  SpO2: 96% 97% 97% 96%  Weight:      Height:        Intake/Output Summary (Last 24 hours) at 11/19/15 1259 Last data filed at 11/19/15 0834  Gross per 24 hour  Intake              360 ml  Output                0 ml  Net              360 ml   Filed Weights   11/16/15 0014  Weight: 225 lb 11.2 oz (102.4 kg)    Physical Exam    GEN: Well nourished, well developed, in no acute distress.  HEENT: Grossly normal.  Neck: Supple, no JVD, carotid bruits, or masses. Cardiac: RRR, no murmurs, rubs, or gallops. No clubbing, cyanosis, edema.  Radials/DP/PT 2+ and equal bilaterally.  Respiratory:  Respirations regular and unlabored, clear to auscultation bilaterally. GI: Soft, nontender, nondistended, BS + x 4. MS: no deformity or atrophy. Skin: warm and dry, no rash. Neuro:  Strength and sensation are intact. Psych: AAOx3.  Normal affect.  Labs    CBC  Recent Labs  11/19/15 0554  WBC 8.2  HGB 11.7*  HCT 36.2  MCV 90.3  PLT [id number]   Basic Metabolic Panel No  results for input(s): NA, K, CL, CO2, GLUCOSE, BUN, CREATININE, CALCIUM, MG, PHOS in the last 72 hours. Liver Function Tests No results for input(s): AST, ALT, ALKPHOS, BILITOT, PROT, ALBUMIN in the last 72 hours. No results for input(s): LIPASE, AMYLASE in the last 72 hours. Cardiac Enzymes No results for input(s): CKTOTAL, CKMB, CKMBINDEX, TROPONINI in the last 72 hours. BNP Invalid input(s): POCBNP D-Dimer No results for input(s): DDIMER in the last 72 hours. Hemoglobin A1C No results for input(s): HGBA1C in the last 72 hours. Fasting Lipid Panel No results for input(s): CHOL, HDL, LDLCALC, TRIG, CHOLHDL, LDLDIRECT in the last 72 hours. Thyroid Function Tests No results for input(s): TSH, T4TOTAL, T3FREE, THYROIDAB in the last 72 hours.  Invalid input(s): FREET3  Telemetry     - Personally Reviewed  ECG     - Personally Reviewed  Radiology    No results found.  Cardiac Studies   2D echo  Study Conclusions  - Left ventricle: The cavity size was normal. There was moderate   concentric hypertrophy. Systolic  function was normal. The   estimated ejection fraction was in the range of 55% to 60%. Wall   motion was normal; there were no regional wall motion   abnormalities. Left ventricular diastolic function parameters   were normal. - Left atrium: The atrium was mildly to moderately dilated. - Right atrium: The atrium was mildly dilated.  Patient Profile      65 y.o. female with a history of HTN, GERD, OSA and depression who recently seen in clinic for cardiac evaluation after a 2D ECHO showed G2DD (she requested a specialist) now presented to [facility] after fall 11/15/15 and noted to have orthostatic hypotension and brief episode of afib with RVR.    Assessment & Plan    1. Atrial fibrillation with RVR - new diagnosis, HR up to 170 for 12 hours cardioverted spontaneously on its own, the patient states that she has had similar episodes in the past, she felt it was  anxiety.  Continue metoprolol 50 mg po BID.  Started on Eliquis, she will be discharged to a skilled nursing facility. Echocardiogram with normal LVF and biatrial enlargement.  She is a new patient of Dr Johnsie Cancel.  2. Orthostatic hypotension - Likely due to multiple antihypertensive. She took all her meds at night (Amlodipine 10mg  qd, atenolol 100mg  qd and valsartan 32mg  qd). She was positive for orthostatic in ER. S/p discontinuation of chronic prednisone therapy, negative cosyntropine test. Slowly restarting antihypertensives. Continue BB and low dose ARB.   BP well controlled today.  3. Chronic diastolic dysfunction - She is euvolemic.   4. OSA on CPAP - Advised to loose weight.   No new recs at this time.  Please have patient followup in office in 1-2 weeks with Dr. Johnsie Cancel. Will sign off.  Signed, Fransico Him, MD  11/19/2015, 12:59 PM

## 2015-11-20 ENCOUNTER — Encounter: Payer: Self-pay | Admitting: Physician Assistant

## 2015-11-21 ENCOUNTER — Ambulatory Visit: Payer: PRIVATE HEALTH INSURANCE | Admitting: Physician Assistant

## 2015-11-21 ENCOUNTER — Telehealth: Payer: Self-pay | Admitting: *Deleted

## 2015-11-21 ENCOUNTER — Telehealth: Payer: Self-pay | Admitting: Physician Assistant

## 2015-11-21 NOTE — Telephone Encounter (Signed)
Needs verbal orders for physical therapy:  Week 1 - 1 time per week Week 2 - 2 times per week  Please return call to [place] at [phone].

## 2015-11-21 NOTE — Telephone Encounter (Signed)
Admit date: 11/15/2015 Discharge date: 11/19/2015  Recommendations for Outpatient Follow-up:  Continue apixaban as prescribed twice a day Continue metoprolol and losartan  Discharge Diagnoses:  Principal Problem:   Orthostatic hypotension Active Problems:   Fall   Thoracic compression fracture [facility])   Depression   New onset atrial fibrillation [facility])    Discharge Condition: stable     Transition Care Management Follow-up Telephone Call   How have you been since you were released from the hospital? "I think I'm doing really well".   Do you understand why you were in the hospital? yes   Do you understand the discharge instructions? yes   Where were you discharged to? Home with husband   Items Reviewed:  Medications reviewed: yes  Allergies reviewed: yes  Dietary changes reviewed: yes  Referrals reviewed: yes    Functional Questionnaire:   Activities of Daily Living (ADLs):   She states they are independent in the following: ambulation, bathing and hygiene, feeding, continence, grooming, toileting and dressing States they require assistance with the following: using walker and husband preparing meals.   Any transportation issues/concerns?: no   Any patient concerns? no   Confirmed importance and date/time of follow-up visits scheduled yes  Provider Appointment booked with Elyn Aquas PA-C 11/26/15 @1030 .  Confirmed with patient if condition begins to worsen call PCP or go to the ER.  Patient was given the office number and encouraged to call back with question or concerns.  : yes

## 2015-11-21 NOTE — Telephone Encounter (Signed)
Please advise 

## 2015-11-22 ENCOUNTER — Encounter: Payer: Self-pay | Admitting: Physician Assistant

## 2015-11-22 NOTE — Telephone Encounter (Signed)
Ok to give verbal orders?

## 2015-11-22 NOTE — Telephone Encounter (Signed)
Shannon [name] at [facility] care ok per [facility] for verbal orders for physical therapy

## 2015-11-24 ENCOUNTER — Inpatient Hospital Stay (HOSPITAL_COMMUNITY)
Admission: EM | Admit: 2015-11-24 | Discharge: 2015-11-26 | DRG: 309 | Disposition: A | Discharge status: Home Health | Payer: No Typology Code available for payment source | Attending: Internal Medicine | Admitting: Internal Medicine

## 2015-11-24 ENCOUNTER — Encounter (HOSPITAL_COMMUNITY): Payer: Self-pay | Admitting: *Deleted

## 2015-11-24 DIAGNOSIS — Z87891 Personal history of nicotine dependence: Secondary | ICD-10-CM

## 2015-11-24 DIAGNOSIS — Z888 Allergy status to other drugs, medicaments and biological substances status: Secondary | ICD-10-CM

## 2015-11-24 DIAGNOSIS — I480 Paroxysmal atrial fibrillation: Principal | ICD-10-CM | POA: Diagnosis present

## 2015-11-24 DIAGNOSIS — I10 Essential (primary) hypertension: Secondary | ICD-10-CM | POA: Diagnosis present

## 2015-11-24 DIAGNOSIS — F329 Major depressive disorder, single episode, unspecified: Secondary | ICD-10-CM | POA: Diagnosis present

## 2015-11-24 DIAGNOSIS — I248 Other forms of acute ischemic heart disease: Secondary | ICD-10-CM | POA: Diagnosis present

## 2015-11-24 DIAGNOSIS — I5032 Chronic diastolic (congestive) heart failure: Secondary | ICD-10-CM | POA: Diagnosis present

## 2015-11-24 DIAGNOSIS — W19XXXA Unspecified fall, initial encounter: Secondary | ICD-10-CM | POA: Diagnosis present

## 2015-11-24 DIAGNOSIS — R748 Abnormal levels of other serum enzymes: Secondary | ICD-10-CM | POA: Diagnosis not present

## 2015-11-24 DIAGNOSIS — Z882 Allergy status to sulfonamides status: Secondary | ICD-10-CM

## 2015-11-24 DIAGNOSIS — S22089A Unspecified fracture of T11-T12 vertebra, initial encounter for closed fracture: Secondary | ICD-10-CM | POA: Diagnosis present

## 2015-11-24 DIAGNOSIS — R778 Other specified abnormalities of plasma proteins: Secondary | ICD-10-CM | POA: Diagnosis present

## 2015-11-24 DIAGNOSIS — I951 Orthostatic hypotension: Secondary | ICD-10-CM | POA: Diagnosis present

## 2015-11-24 DIAGNOSIS — S22000A Wedge compression fracture of unspecified thoracic vertebra, initial encounter for closed fracture: Secondary | ICD-10-CM | POA: Diagnosis present

## 2015-11-24 DIAGNOSIS — I110 Hypertensive heart disease with heart failure: Secondary | ICD-10-CM | POA: Diagnosis present

## 2015-11-24 DIAGNOSIS — I4891 Unspecified atrial fibrillation: Secondary | ICD-10-CM | POA: Diagnosis not present

## 2015-11-24 DIAGNOSIS — S22000S Wedge compression fracture of unspecified thoracic vertebra, sequela: Secondary | ICD-10-CM | POA: Diagnosis not present

## 2015-11-24 DIAGNOSIS — E559 Vitamin D deficiency, unspecified: Secondary | ICD-10-CM | POA: Diagnosis present

## 2015-11-24 DIAGNOSIS — Z885 Allergy status to narcotic agent status: Secondary | ICD-10-CM

## 2015-11-24 DIAGNOSIS — G4733 Obstructive sleep apnea (adult) (pediatric): Secondary | ICD-10-CM | POA: Diagnosis present

## 2015-11-24 DIAGNOSIS — R7989 Other specified abnormal findings of blood chemistry: Secondary | ICD-10-CM | POA: Diagnosis present

## 2015-11-24 LAB — CBC WITH DIFFERENTIAL/PLATELET
Basophils Absolute: 0 10*3/uL (ref 0.0–0.1)
Basophils Relative: 0 %
EOS ABS: 0.5 10*3/uL (ref 0.0–0.7)
EOS PCT: 6 %
HCT: 37.7 % (ref 36.0–46.0)
Hemoglobin: 12.3 g/dL (ref 12.0–15.0)
LYMPHS ABS: 1.9 10*3/uL (ref 0.7–4.0)
Lymphocytes Relative: 20 %
MCH: 29.3 pg (ref 26.0–34.0)
MCHC: 32.6 g/dL (ref 30.0–36.0)
MCV: 89.8 fL (ref 78.0–100.0)
MONO ABS: 0.6 10*3/uL (ref 0.1–1.0)
MONOS PCT: 6 %
Neutro Abs: 6.5 10*3/uL (ref 1.7–7.7)
Neutrophils Relative %: 68 %
PLATELETS: 374 10*3/uL (ref 150–400)
RBC: 4.2 MIL/uL (ref 3.87–5.11)
RDW: 13.7 % (ref 11.5–15.5)
WBC: 9.6 10*3/uL (ref 4.0–10.5)

## 2015-11-24 LAB — BASIC METABOLIC PANEL
Anion gap: 10 (ref 5–15)
CALCIUM: 9.4 mg/dL (ref 8.9–10.3)
CO2: 24 mmol/L (ref 22–32)
CREATININE: 0.64 mg/dL (ref 0.44–1.00)
Chloride: 106 mmol/L (ref 101–111)
GFR calc non Af Amer: >60 mL/min (ref >60)
Glucose, Bld: 125 mg/dL — ABNORMAL HIGH (ref 65–99)
Potassium: 3.9 mmol/L (ref 3.5–5.1)
SODIUM: 140 mmol/L (ref 135–145)

## 2015-11-24 LAB — TROPONIN I
TROPONIN I: 0.04 ng/mL — AB (ref <0.03)
Troponin I: 0.05 ng/mL (ref <0.03)

## 2015-11-24 MED ORDER — HYDROCODONE-ACETAMINOPHEN 5-325 MG PO TABS
1.0000 | ORAL_TABLET | Freq: Once | ORAL | Status: AC
  Administered 2015-11-24: 1 via ORAL
  Filled 2015-11-24: qty 1

## 2015-11-24 MED ORDER — DILTIAZEM LOAD VIA INFUSION
10.0000 mg | Freq: Once | INTRAVENOUS | Status: AC
  Administered 2015-11-24: 10 mg via INTRAVENOUS
  Filled 2015-11-24: qty 10

## 2015-11-24 MED ORDER — DULOXETINE HCL 30 MG PO CPEP
30.0000 mg | ORAL_CAPSULE | Freq: Every day | ORAL | Status: DC
  Administered 2015-11-24 – 2015-11-26 (×3): 30 mg via ORAL
  Filled 2015-11-24 (×2): qty 1

## 2015-11-24 MED ORDER — METOPROLOL TARTRATE 50 MG PO TABS
50.0000 mg | ORAL_TABLET | Freq: Two times a day (BID) | ORAL | Status: DC
  Administered 2015-11-24 – 2015-11-26 (×4): 50 mg via ORAL
  Filled 2015-11-24 (×4): qty 1

## 2015-11-24 MED ORDER — ONDANSETRON HCL 4 MG/2ML IJ SOLN: 4.0000 mg | Freq: Four times a day (QID) | INTRAMUSCULAR | Status: DC | PRN

## 2015-11-24 MED ORDER — ALBUTEROL SULFATE (2.5 MG/3ML) 0.083% IN NEBU: 3.0000 mL | INHALATION_SOLUTION | RESPIRATORY_TRACT | Status: DC | PRN

## 2015-11-24 MED ORDER — DEXTROSE 5 % IV SOLN
10.0000 mg/h | INTRAVENOUS | Status: DC
  Administered 2015-11-24 (×3): 10 mg/h via INTRAVENOUS
  Filled 2015-11-24 (×2): qty 100

## 2015-11-24 MED ORDER — ACETAMINOPHEN 325 MG PO TABS: 650.0000 mg | ORAL_TABLET | ORAL | Status: DC | PRN

## 2015-11-24 MED ORDER — APIXABAN 5 MG PO TABS
5.0000 mg | ORAL_TABLET | Freq: Two times a day (BID) | ORAL | Status: DC
  Administered 2015-11-24 – 2015-11-26 (×4): 5 mg via ORAL
  Filled 2015-11-24 (×4): qty 1

## 2015-11-24 MED ORDER — ALPRAZOLAM 0.5 MG PO TABS: 0.5000 mg | ORAL_TABLET | Freq: Three times a day (TID) | ORAL | Status: DC | PRN

## 2015-11-24 MED ORDER — DEXTROSE 5 % IV SOLN
5.0000 mg/h | INTRAVENOUS | Status: DC
  Administered 2015-11-24 – 2015-11-25 (×2): 5 mg/h via INTRAVENOUS
  Filled 2015-11-24: qty 100

## 2015-11-24 MED ORDER — LOSARTAN POTASSIUM 25 MG PO TABS
25.0000 mg | ORAL_TABLET | Freq: Every day | ORAL | Status: DC
  Filled 2015-11-24: qty 1

## 2015-11-24 MED ORDER — HYDROCODONE-ACETAMINOPHEN 5-325 MG PO TABS
1.0000 | ORAL_TABLET | Freq: Four times a day (QID) | ORAL | Status: DC | PRN
  Administered 2015-11-24 – 2015-11-26 (×5): 1 via ORAL
  Filled 2015-11-24: qty 2
  Filled 2015-11-24 (×3): qty 1
  Filled 2015-11-24: qty 2

## 2015-11-24 MED ORDER — LOSARTAN POTASSIUM 25 MG PO TABS
25.0000 mg | ORAL_TABLET | Freq: Every day | ORAL | Status: DC
  Administered 2015-11-24 – 2015-11-25 (×2): 25 mg via ORAL
  Filled 2015-11-24: qty 1

## 2015-11-24 MED ORDER — DULOXETINE HCL 30 MG PO CPEP
30.0000 mg | ORAL_CAPSULE | Freq: Every day | ORAL | Status: DC
  Filled 2015-11-24: qty 1

## 2015-11-24 NOTE — ED Notes (Signed)
Pt hooked up and ready for transport.

## 2015-11-24 NOTE — ED Notes (Signed)
Pt requesting regular pain meds for back pain.

## 2015-11-24 NOTE — H&P (Signed)
History and Physical  Shannon [name] [id number] DOB: March 02, 1950 DOA: 11/24/2015  Referring physician: Judithann Sheen, ED provider PCP: Leeanne Rio, PA-C  Outpatient Specialists:   Chief Complaint: Palpitations  HPI: Shannon [name] is a 65 y.o. female with a history of CHF, GERD, hypertension, hyperlipidemia, drug-induced lupus with HCTZ, recent T12 compression fracture, A. fib on Eliquis who was admitted from 11/9 until 11/13 for new onset A. fib. She has not followed up with A. fib clinic after her recent hospitalization. She has been feeling fine until this morning when she woke up, took her hydrocodone and went back to sleep. She woke up with palpitations and had her husband checked her blood pressure and heart rate and it was noted to be elevated. Patient was brought to the hospital for evaluation. Provoking factors.  Emergency Department Course: On presentation, the patient was noted to be in atrial fibrillation with RVR. Patient was started on Cardizem drip, which has improved her symptoms. Initial troponin was 0.05. She reports being compliant with her Eliquis.  Review of Systems:   Pt denies any fevers, chills, nausea, vomiting, diarrhea, constipation, abdominal pain, shortness of breath, dyspnea on exertion, orthopnea, cough, wheezing, palpitations, headache, vision changes, lightheadedness, dizziness, melena, rectal bleeding.  Review of systems are otherwise negative  Past Medical History:  Diagnosis Date  . Anxiety   . Arthritis    neck and back  . Bronchitis   . CHF (congestive heart failure) ([place])   . Degenerative disorder of bone   . Depression   . GERD (gastroesophageal reflux disease)   . Herniated disc, cervical   . Hyperlipidemia   . Hypertension   . Lupus    HCTZ induced  . 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 Obstructive sleep apnea   . Osteopenia   . Pinched nerve in  neck   . Sleep apnea   . Vitamin D deficiency   . Whiplash injury 06/07/2010   Past Surgical History:  Procedure Laterality Date  . APPENDECTOMY    . arm surgery Left    Social History:  reports that she quit smoking about 2 years ago. Her smoking use included Cigarettes. She has quit using smokeless tobacco. She reports that she drinks about 0.6 oz of alcohol per week . She reports that she does not use drugs. Patient lives at [place]  . Ace Inhibitors Cough  . Ace Inhibitors Cough  . Hctz [Hydrochlorothiazide] Other (See Comments)    Caused drug-induced LUPUS  . Other Other (See Comments)    Unknown anti-inflammatory: Caused extremely aggressive behavior (not Prednisone)  . Oxycodone     hallucinations  . Oxycodone Other (See Comments)    Hallucinations  . Sulfa Antibiotics Nausea And Vomiting  . Sulfur Nausea And Vomiting  . Voltaren [Diclofenac Sodium] Other (See Comments)    Hypersensitivity    Family History  Problem Relation Age of Onset  . Cancer Mother   . Stroke Father   . Hypertension Father   . Diabetes Neg Hx       Prior to Admission medications   Medication Sig Start Date End Date Taking? Authorizing Provider  acetaminophen (TYLENOL) 325 MG tablet Take 325-650 mg by mouth every 6 (six) hours as needed for mild pain.   Yes Historical Provider, MD  albuterol (PROVENTIL HFA;VENTOLIN HFA) 108 (90 BASE) MCG/ACT inhaler Inhale 1 puff into the lungs every  4 (four) hours as needed for wheezing or shortness of breath.   Yes Historical Provider, MD  ALPRAZolam Duanne Moron) 0.5 MG tablet Take 0.5 mg by mouth 3 (three) times daily as needed for anxiety.   Yes Historical Provider, MD  apixaban (ELIQUIS) 5 MG TABS tablet Take 1 tablet (5 mg total) by mouth 2 (two) times daily. 11/19/15  Yes Robbie Lis, MD  Cholecalciferol (VITAMIN D-3 PO) Take 1 capsule by mouth at bedtime.   Yes Historical Provider, MD  DULoxetine (CYMBALTA) 30 MG capsule Take 1  capsule (30 mg total) by mouth daily. 09/24/15  Yes Brunetta Jeans, PA-C  ergocalciferol (VITAMIN D2) 50000 units capsule Take 1 capsule (50,000 Units total) by mouth every 30 (thirty) days. 08/29/15  Yes Brunetta Jeans, PA-C  HYDROcodone-acetaminophen (NORCO/VICODIN) 5-325 MG tablet Take 1-2 tablets by mouth every 6 (six) hours as needed for severe pain. 11/15/15  Yes Harlin Heys, MD  losartan (COZAAR) 25 MG tablet Take 1 tablet (25 mg total) by mouth daily. 11/20/15  Yes Robbie Lis, MD  metoprolol (LOPRESSOR) 50 MG tablet Take 1 tablet (50 mg total) by mouth 2 (two) times daily. 11/19/15  Yes Robbie Lis, MD  traMADol (ULTRAM) 50 MG tablet Take 50 mg by mouth at bedtime as needed (for pain).   Yes Historical Provider, MD  amLODipine (NORVASC) 10 MG tablet TAKE ONE TABLET BY MOUTH ONCE DAILY Patient not taking: Reported on 11/21/2015 11/05/15   Brunetta Jeans, PA-C  atenolol (TENORMIN) 100 MG tablet Take 1 tablet (100 mg total) by mouth daily. Patient not taking: Reported on 11/21/2015 09/19/15   Brunetta Jeans, PA-C  sertraline (ZOLOFT) 100 MG tablet Take 1 tablet (100 mg total) by mouth daily. Patient not taking: Reported on 11/21/2015 05/09/15   Brunetta Jeans, PA-C  valsartan (DIOVAN) 320 MG tablet TAKE ONE TABLET BY MOUTH ONCE DAILY Patient not taking: Reported on 11/21/2015 10/08/15   Brunetta Jeans, PA-C    Physical Exam: BP 119/82   Pulse 84   Temp 98.2 F (36.8 C) (Oral)   Resp 16   Ht 5\' 7"  (1.702 m)   Wt 95.7 kg (211 lb)   SpO2 94%   BMI 33.05 kg/m   General: Elderly Caucasian female. Awake and alert and oriented x3. No acute cardiopulmonary distress.  HEENT: Normocephalic atraumatic.  Right and left ears normal in appearance.  Pupils equal, round, reactive to light. Extraocular muscles are intact. Sclerae anicteric and noninjected.  Moist mucosal membranes. No mucosal lesions.  Neck: Neck supple without lymphadenopathy. No carotid bruits. No masses palpated.    Cardiovascular: Irregular rate. No murmurs, rubs, gallops auscultated. No JVD.  Respiratory: Good respiratory effort with no wheezes, rales, rhonchi. Lungs clear to auscultation bilaterally.  No accessory muscle use. Abdomen: Soft, nontender, nondistended. Active bowel sounds. No masses or hepatosplenomegaly  Skin: No rashes, lesions, or ulcerations.  Dry, warm to touch. 2+ dorsalis pedis and radial pulses. Musculoskeletal: No calf or leg pain. All major joints not erythematous nontender.  No upper or lower joint deformation.  Lay still and then due to back pain from compression fracture  No contractures  Psychiatric: Intact judgment and insight. Pleasant and cooperative. Neurologic: No focal neurological deficits. Strength is 5/5 and symmetric in upper and lower extremities.  Cranial nerves II through XII are grossly intact.           Labs on Admission: I have personally reviewed following labs and imaging studies  CBC:  Recent Labs Lab 11/19/15 0554 11/24/15 0939  WBC 8.2 9.6  NEUTROABS  --  6.5  HGB 11.7* 12.3  HCT 36.2 37.7  MCV 90.3 89.8  PLT 335 [id number]   Basic Metabolic Panel:  Recent Labs Lab 11/24/15 0939  NA 140  K 3.9  CL 106  CO2 24  GLUCOSE 125*  BUN <5*  CREATININE 0.64  CALCIUM 9.4   GFR: Estimated Creatinine Clearance: 84.3 mL/min (by C-G formula based on SCr of 0.64 mg/dL). Liver Function Tests: No results for input(s): AST, ALT, ALKPHOS, BILITOT, PROT, ALBUMIN in the last 168 hours. No results for input(s): LIPASE, AMYLASE in the last 168 hours. No results for input(s): AMMONIA in the last 168 hours. Coagulation Profile: No results for input(s): INR, PROTIME in the last 168 hours. Cardiac Enzymes:  Recent Labs Lab 11/24/15 0939  TROPONINI 0.05*   BNP (last 3 results) No results for input(s): PROBNP in the last 8760 hours. HbA1C: No results for input(s): HGBA1C in the last 72 hours. CBG: No results for input(s): GLUCAP in the last 168  hours. Lipid Profile: No results for input(s): CHOL, HDL, LDLCALC, TRIG, CHOLHDL, LDLDIRECT in the last 72 hours. Thyroid Function Tests: No results for input(s): TSH, T4TOTAL, FREET4, T3FREE, THYROIDAB in the last 72 hours. Anemia Panel: No results for input(s): VITAMINB12, FOLATE, FERRITIN, TIBC, IRON, RETICCTPCT in the last 72 hours. Urine analysis:    Component Value Date/Time   COLORURINE YELLOW 11/15/2015 1838   APPEARANCEUR HAZY (A) 11/15/2015 1838   LABSPEC 1.024 11/15/2015 1838   PHURINE 5.5 11/15/2015 1838   GLUCOSEU NEGATIVE 11/15/2015 1838   HGBUR NEGATIVE 11/15/2015 1838   BILIRUBINUR SMALL (A) 11/15/2015 1838   KETONESUR 15 (A) 11/15/2015 1838   PROTEINUR 30 (A) 11/15/2015 1838   NITRITE NEGATIVE 11/15/2015 1838   LEUKOCYTESUR NEGATIVE 11/15/2015 1838   Sepsis Labs: @LABRCNTIP (procalcitonin:4,lacticidven:4) )No results found for this or any previous visit (from the past 240 hour(s)).   Radiological Exams on Admission: No results found.  EKG: Independently reviewed. Atrial fibrillation with RVR. Repolarization abnormalities in leads 2, 3, aVF, V2.  Assessment/Plan: Principal Problem:   Atrial fibrillation with RVR (HCC) Active Problems:   Essential hypertension   Thoracic compression fracture (HCC)   Elevated troponin I level    This patient was discussed with the ED physician, including pertinent vitals, physical exam findings, labs, and imaging.  We also discussed care given by the ED provider.  #1 A fibrillation with RVR  Observation  Continue Cardizem drip and converted to by mouth per protocol  Repeat BMP in the morning  Continue Eliquis #2 elevated troponin I level  We'll repeat troponin I level #3 thoracic compression fracture  Continue pain control 4 essential hypertension  Continue home medications  DVT prophylaxis: Eliquis Consultants: None Code Status: Full code Family Communication: Husband in the room  Disposition Plan:  Patient will be able to return home following observation   Truett Mainland, DO [facility] Pager [phone]  If 7PM-7AM, please contact night-coverage [email or web address] Password [email or web address]

## 2015-11-24 NOTE — ED Notes (Signed)
Called [facility]. They have not seen order for bed request and stated different type of bed request must be made.  Dr Nehemiah Settle paged.

## 2015-11-24 NOTE — ED Notes (Addendum)
Went to draw Troponin and Doctor in room advised same had already been drawn.   Same has not been clicked off.

## 2015-11-24 NOTE — ED Provider Notes (Signed)
[facility] DEPT Provider Note   CSN: [id number] Arrival date & time: 11/24/15  [id number]     History   Chief Complaint Chief Complaint  Patient presents with  . Palpitations    HPI Sole Wheatley is a 65 y.o. female.  Patient is 65 yo F with PMH of CHF, hypertension, recent T12 compression fracture following mechanical fall on 11/8, presenting today via EMS after episode of palpitations determined to be a fib with RVR starting at 7:30 AM. Patient was admitted for episode of hypotension in ED following fall on 11/8, had negative cosyntropin test and echo showing EF 55%, and cardiology determined hypotension was due to HCTZ. She also had transient episode of a fib while admitted which spontaneously converted to sinus rhythm. She was started on losartan, metoprolol, and Eliquis, and scheduled for f/u with cardiology on 11/29. Today she woke up around 7:30 AM, took her morning medications including losartan, metoprolol, and Norco for back pain, with episode of palpitations starting shortly after. Palpitations would not subside so she called EMS who noted her to be in a fib with RVR at 150-160. She was given 10 mg Cardizem and heart rate improved to 90-140 but sustained a fib. She denies any chest pain, shortness of breath, swelling or pain in legs, or headache.      Past Medical History:  Diagnosis Date  . Anxiety   . Arthritis    neck and back  . Bronchitis   . CHF (congestive heart failure) ([place])   . Degenerative disorder of bone   . Depression   . GERD (gastroesophageal reflux disease)   . Herniated disc, cervical   . Hyperlipidemia   . Hypertension   . Lupus    HCTZ induced  . Neuromuscular disorder ([place])    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 HTN  . Obstructive sleep apnea   . Osteopenia   . Pinched nerve in neck   . Sleep apnea   . Vitamin D deficiency   . Whiplash injury 06/07/2010    Patient Active Problem List   Diagnosis Date Noted  . New  onset atrial fibrillation ([place])   . Fall 11/16/2015  . Thoracic compression fracture ([place]) 11/16/2015  . Depression 11/16/2015  . Orthostatic hypotension 11/15/2015  . Osteopenia 09/12/2015  . Postmenopausal estrogen deficiency 08/29/2015  . Vitamin D deficiency 08/29/2015  . OSA (obstructive sleep apnea) 03/04/2015  . Polymyalgia ([place]) 11/22/2014  . Other fatigue 10/28/2014  . Benign paroxysmal positional vertigo 08/21/2014  . Essential hypertension 08/21/2014  . Anxiety and depression 08/21/2014  . Hip stiffness 08/21/2014    Past Surgical History:  Procedure Laterality Date  . APPENDECTOMY    . arm surgery Left     OB History    No data available       Home Medications    Prior to Admission medications   Medication Sig Start Date End Date Taking? Authorizing Provider  acetaminophen (TYLENOL) 325 MG tablet Take 325-650 mg by mouth every 6 (six) hours as needed for mild pain.    Historical Provider, MD  albuterol (PROVENTIL HFA;VENTOLIN HFA) 108 (90 BASE) MCG/ACT inhaler Inhale 1 puff into the lungs every 4 (four) hours as needed for wheezing or shortness of breath.    Historical Provider, MD  ALPRAZolam Duanne Moron) 0.5 MG tablet Take 0.5 mg by mouth 3 (three) times daily as needed for anxiety.    Historical Provider, MD  amLODipine (NORVASC) 10 MG tablet TAKE ONE TABLET BY  MOUTH ONCE DAILY Patient not taking: Reported on 11/21/2015 11/05/15   Brunetta Jeans, PA-C  apixaban (ELIQUIS) 5 MG TABS tablet Take 1 tablet (5 mg total) by mouth 2 (two) times daily. 11/19/15   Robbie Lis, MD  atenolol (TENORMIN) 100 MG tablet Take 1 tablet (100 mg total) by mouth daily. Patient not taking: Reported on 11/21/2015 09/19/15   Brunetta Jeans, PA-C  Calcium Carbonate-Vitamin D (CALCIUM 600+D) 600-200 MG-UNIT TABS Take 1 tablet by mouth daily.    Historical Provider, MD  Cholecalciferol (VITAMIN D-3 PO) Take 1 capsule by mouth at bedtime.    Historical Provider, MD  DULoxetine  (CYMBALTA) 30 MG capsule Take 1 capsule (30 mg total) by mouth daily. 09/24/15   Brunetta Jeans, PA-C  DULoxetine (CYMBALTA) 30 MG capsule Take 30 mg by mouth daily.    Historical Provider, MD  ergocalciferol (VITAMIN D2) 50000 units capsule Take 1 capsule (50,000 Units total) by mouth every 30 (thirty) days. 08/29/15   Brunetta Jeans, PA-C  HYDROcodone-acetaminophen (NORCO/VICODIN) 5-325 MG tablet Take 1-2 tablets by mouth every 6 (six) hours as needed for severe pain. 11/15/15   Harlin Heys, MD  losartan (COZAAR) 25 MG tablet Take 1 tablet (25 mg total) by mouth daily. 11/20/15   Robbie Lis, MD  meclizine (ANTIVERT) 25 MG tablet Take 1 tablet (25 mg total) by mouth 3 (three) times daily as needed for dizziness. Patient not taking: Reported on 11/21/2015 08/15/14   Brunetta Jeans, PA-C  metoprolol (LOPRESSOR) 50 MG tablet Take 1 tablet (50 mg total) by mouth 2 (two) times daily. 11/19/15   Robbie Lis, MD  sertraline (ZOLOFT) 100 MG tablet Take 1 tablet (100 mg total) by mouth daily. Patient not taking: Reported on 11/21/2015 05/09/15   Brunetta Jeans, PA-C  traMADol (ULTRAM) 50 MG tablet Take 50 mg by mouth at bedtime as needed (for pain).    Historical Provider, MD  valsartan (DIOVAN) 320 MG tablet TAKE ONE TABLET BY MOUTH ONCE DAILY Patient not taking: Reported on 11/21/2015 10/08/15   Brunetta Jeans, PA-C  Vitamin D, Ergocalciferol, (DRISDOL) 50000 units CAPS capsule Take 50,000 Units by mouth every 30 (thirty) days.    Historical Provider, MD    Family History Family History  Problem Relation Age of Onset  . Cancer Mother   . Stroke Father   . Hypertension Father   . Diabetes Neg Hx     Social History Social History  Substance Use Topics  . Smoking status: Former Smoker    Types: Cigarettes    Quit date: 2015  . Smokeless tobacco: Former Systems developer     Comment: Pt started to use vaporizer-uses rarely  . Alcohol use 0.6 oz/week    1 Glasses of wine per week     Comment:  social or occationally     Allergies   Ace inhibitors; Ace inhibitors; Hctz [hydrochlorothiazide]; Other; Oxycodone; Oxycodone; Sulfa antibiotics; Sulfur; and Voltaren [diclofenac sodium]   Review of Systems Review of Systems  Constitutional: Negative for chills and fever.  HENT: Negative for ear pain and sore throat.   Eyes: Negative for pain and visual disturbance.  Respiratory: Negative for cough and shortness of breath.   Cardiovascular: Positive for palpitations. Negative for chest pain and leg swelling.  Gastrointestinal: Negative for abdominal pain, blood in stool, nausea and vomiting.  Genitourinary: Negative for dysuria, flank pain and hematuria.  Musculoskeletal: Positive for back pain (secondary to prior T12 fracture). Negative for  neck pain.  Skin: Negative for color change and rash.  Neurological: Negative for dizziness, seizures, syncope, weakness, numbness and headaches.     Physical Exam Updated Vital Signs BP 151/88   Pulse 117   Temp 98.2 F (36.8 C) (Oral)   Resp 24   Ht 5\' 7"  (1.702 m)   Wt 95.7 kg   SpO2 96%   BMI 33.05 kg/m   Physical Exam  Constitutional: She appears well-developed and well-nourished. No distress.  HENT:  Head: Normocephalic and atraumatic.  Mouth/Throat: Oropharynx is clear and moist.  Eyes: Conjunctivae are normal.  Neck: Normal range of motion.  Cardiovascular: Normal heart sounds and intact distal pulses.   Irregularly irregular rhythm noted with HR 90-130  Pulmonary/Chest: Effort normal and breath sounds normal. No respiratory distress.  Abdominal: Soft. There is no tenderness.  Musculoskeletal: Normal range of motion. She exhibits no edema or tenderness.  Neurological: She is alert.  Skin: Skin is warm and dry.  Psychiatric: She has a normal mood and affect.  Nursing note and vitals reviewed.    ED Treatments / Results  Labs (all labs ordered are listed, but only abnormal results are displayed) Labs Reviewed    BASIC METABOLIC PANEL  TROPONIN I  CBC WITH DIFFERENTIAL/PLATELET    EKG  EKG Interpretation  Date/Time:  Saturday November 24 2015 09:35:27 EST Ventricular Rate:  134 PR Interval:    QRS Duration: 98 QT Interval:  288 QTC Calculation: 430 R Axis:   67 Text Interpretation:  Atrial fibrillation Repol abnrm suggests ischemia, diffuse leads Baseline wander in lead(s) I II aVR aVF V1 V2 V3 V4 V5 Confirmed by RAY MD, DANIELLE ([id number]) on 11/24/2015 10:00:45 AM       Radiology No results found.  Procedures Procedures (including critical care time)  Medications Ordered in ED Medications  diltiazem (CARDIZEM) 1 mg/mL load via infusion 10 mg (not administered)    And  diltiazem (CARDIZEM) 100 mg in dextrose 5 % 100 mL (1 mg/mL) infusion (not administered)     Initial Impression / Assessment and Plan / ED Course  I have reviewed the triage vital signs and the nursing notes.  Pertinent labs & imaging results that were available during my care of the patient were reviewed by me and considered in my medical decision making (see chart for details).  Clinical Course    Patient is 65 yo F presenting via EMS after episode of palpitations determined to be a fib with RVR. After receiving 10 mg Cardizem en route, she presented in a fib with HR of 90-130. Cardizem load and infusion was ordered and HR stabilized to 80-90, but she remained in sustained a fib. Denied chest pain, and initial troponin 0.04. EKG was reviewed and patient evaluated by attending Dr. Pattricia Boss, who agreed patient was stable and recommended consult to hospitalist for admission. Hospitalist, Dr. Loma Boston, agreed to admit patient for observation. Patient notified and agreeable to plan.  Final Clinical Impressions(s) / ED Diagnoses   Final diagnoses:  Atrial fibrillation with RVR [facility])    New Prescriptions New Prescriptions   No medications on file     Rosilyn Mings II, [place] 11/24/15 2002     Pattricia Boss, MD 12/03/15 [phone]

## 2015-11-24 NOTE — Progress Notes (Signed)
2W charge nurse attempted to receive report x2.    Izola Price, RN

## 2015-11-24 NOTE — ED Triage Notes (Signed)
Pt states she woke up at 0700 feeling "funny", like palpitations.  When EMS arrived pt hr was 150 - 160 afib. Pt became diaphoretic and EMS gave 10 mg cardizen with improvement of HR to 90 - 140, afib.  BP 131/68, cbg 131.  Pt took her metoprolol and losartan.

## 2015-11-24 NOTE — ED Notes (Signed)
Pt O2 sats dropped to 89% when sleeping on 4L [place].  Upon waking and speaking for several min, sats increased to 99%.

## 2015-11-24 NOTE — ED Notes (Signed)
O2 sats dropped to 68% while pt was sleeping.  Placed on NRB then 4L O2.  PA notified.

## 2015-11-24 NOTE — Plan of Care (Signed)
Problem: Pain Managment: Goal: General experience of comfort will improve Outcome: Progressing Pain medication given with relieve

## 2015-11-24 NOTE — ED Notes (Signed)
Pt eating lunch

## 2015-11-24 NOTE — Plan of Care (Signed)
Problem: Cardiac: Goal: Ability to achieve and maintain adequate cardiopulmonary perfusion will improve Outcome: Progressing Heart rate is decreasing. On Cardizem drip.

## 2015-11-24 NOTE — Plan of Care (Signed)
Problem: Activity: Goal: Risk for activity intolerance will decrease Outcome: Not Progressing Will continue to monitor. HR increases with activity.

## 2015-11-24 NOTE — ED Notes (Signed)
PA notified of elevated trop.

## 2015-11-25 ENCOUNTER — Encounter (HOSPITAL_COMMUNITY): Payer: Self-pay | Admitting: *Deleted

## 2015-11-25 DIAGNOSIS — R748 Abnormal levels of other serum enzymes: Secondary | ICD-10-CM

## 2015-11-25 DIAGNOSIS — S22000S Wedge compression fracture of unspecified thoracic vertebra, sequela: Secondary | ICD-10-CM

## 2015-11-25 DIAGNOSIS — I5032 Chronic diastolic (congestive) heart failure: Secondary | ICD-10-CM

## 2015-11-25 DIAGNOSIS — I248 Other forms of acute ischemic heart disease: Secondary | ICD-10-CM | POA: Diagnosis present

## 2015-11-25 DIAGNOSIS — I110 Hypertensive heart disease with heart failure: Secondary | ICD-10-CM | POA: Diagnosis present

## 2015-11-25 DIAGNOSIS — Z882 Allergy status to sulfonamides status: Secondary | ICD-10-CM | POA: Diagnosis not present

## 2015-11-25 DIAGNOSIS — F329 Major depressive disorder, single episode, unspecified: Secondary | ICD-10-CM | POA: Diagnosis present

## 2015-11-25 DIAGNOSIS — Z885 Allergy status to narcotic agent status: Secondary | ICD-10-CM | POA: Diagnosis not present

## 2015-11-25 DIAGNOSIS — I480 Paroxysmal atrial fibrillation: Secondary | ICD-10-CM | POA: Diagnosis present

## 2015-11-25 DIAGNOSIS — G4733 Obstructive sleep apnea (adult) (pediatric): Secondary | ICD-10-CM | POA: Diagnosis present

## 2015-11-25 DIAGNOSIS — I951 Orthostatic hypotension: Secondary | ICD-10-CM | POA: Diagnosis present

## 2015-11-25 DIAGNOSIS — I10 Essential (primary) hypertension: Secondary | ICD-10-CM | POA: Diagnosis not present

## 2015-11-25 DIAGNOSIS — S22089A Unspecified fracture of T11-T12 vertebra, initial encounter for closed fracture: Secondary | ICD-10-CM | POA: Diagnosis present

## 2015-11-25 DIAGNOSIS — Z87891 Personal history of nicotine dependence: Secondary | ICD-10-CM | POA: Diagnosis not present

## 2015-11-25 DIAGNOSIS — I4891 Unspecified atrial fibrillation: Secondary | ICD-10-CM

## 2015-11-25 DIAGNOSIS — Z888 Allergy status to other drugs, medicaments and biological substances status: Secondary | ICD-10-CM | POA: Diagnosis not present

## 2015-11-25 DIAGNOSIS — W19XXXA Unspecified fall, initial encounter: Secondary | ICD-10-CM | POA: Diagnosis present

## 2015-11-25 DIAGNOSIS — E559 Vitamin D deficiency, unspecified: Secondary | ICD-10-CM | POA: Diagnosis present

## 2015-11-25 LAB — BASIC METABOLIC PANEL
Anion gap: 10 (ref 5–15)
BUN: 11 mg/dL (ref 6–20)
CHLORIDE: 101 mmol/L (ref 101–111)
CO2: 26 mmol/L (ref 22–32)
Calcium: 9.8 mg/dL (ref 8.9–10.3)
Creatinine, Ser: 0.87 mg/dL (ref 0.44–1.00)
GFR calc Af Amer: >60 mL/min (ref >60)
GFR calc non Af Amer: >60 mL/min (ref >60)
GLUCOSE: 107 mg/dL — AB (ref 65–99)
POTASSIUM: 3.5 mmol/L (ref 3.5–5.1)
Sodium: 137 mmol/L (ref 135–145)

## 2015-11-25 MED ORDER — MAGNESIUM HYDROXIDE 400 MG/5ML PO SUSP
30.0000 mL | Freq: Every day | ORAL | Status: DC | PRN
  Administered 2015-11-25: 30 mL via ORAL
  Filled 2015-11-25: qty 30

## 2015-11-25 MED ORDER — SODIUM CHLORIDE 0.9% FLUSH: 3.0000 mL | INTRAVENOUS | Status: DC | PRN

## 2015-11-25 MED ORDER — SODIUM CHLORIDE 0.9% FLUSH: 3.0000 mL | Freq: Two times a day (BID) | INTRAVENOUS | Status: DC

## 2015-11-25 MED ORDER — SODIUM CHLORIDE 0.9 % IV SOLN: 250.0000 mL | INTRAVENOUS | Status: DC

## 2015-11-25 NOTE — Consult Note (Signed)
Cardiology Consultation   Patient ID: Shannon [name]; [id number]; 09/25/1950   Admit date: 11/24/2015 Date of Consult: 11/25/2015  Referring MD:  Dr. Alfredia Ferguson  Patient Care Team: Brunetta Jeans, PA-C as PCP - General (Family Medicine)    Reason for Consultation: AFib with RVR   History of Present Illness: Shannon [name] is a 65 y.o. female with a hx of HTN, GERD, OSA, depression.  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 Nuclear stress test done for chest pain in 9/17 was low risk with EF 48 and no ischemia and subsequent Echo was done and demonstrated normal ejection fraction but mod diastolic dysfunction. She was then seen by Angelena Form, PA-C in the office in 10/17.  FU as needed was recommended.  She was admitted 11/17 with significant orthostatic hypotension in the setting of a fall at home.  CT of the thoracic spine revealed a T12 compression fracture and a brace was placed.  She had a negative cosyntropine test.  Her BP meds were slowly resumed.  She had a brief episode of afib with RVR and converted spontaneously on her own and stated that she had had similar episodes in the past and thought it was anxiety.  She was started on Eliquis.  She also has OSA and uses CPAP at home.  She was discharged home on 11/13/29017 in NSR with an appt to followup in afib clinic which has not yet occurred yet.   She was doing well at home but then this am she woke up and took her pain med and went back to sleep and woke up with palpitations and her husband checked her BP and her HR was elevated and he brought her to the ER.  She felt tightness in her chest as well as diaphoresis.  She was in afib with RVR and was started on cardizem gtt.  Her HR has now slowed down. She says that she feels terrible when in atrial fibrillation.  Cardiology is now asked to consult.     Prior Cardiac Studies: Echo 11/18/15 Mod LVH, EF 55-60,  no RWMA, normal diastolic function, mild to mod LAE, mild RAE  Echo 10/24/15 Mild LVH, EF 55-60, Gr 2 DD, no RWMA, mod LAE  Myoview 09/07/15 Normal resting and stress perfusion. No ischemia or infarction EF 48% mild diffuse hypokinesis suggest echo or MRI correlation   Past Medical History:  Diagnosis Date  . Anxiety   . Arthritis    neck and back  . Bronchitis   . CHF (congestive heart failure) ([place])   . Degenerative disorder of bone   . Depression   . GERD (gastroesophageal reflux disease)   . Herniated disc, cervical   . Hyperlipidemia   . Hypertension   . Lupus    HCTZ induced  . 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 Obstructive sleep apnea   . Osteopenia   . Pinched nerve in neck   . Sleep apnea   . Vitamin D deficiency   . Whiplash injury 06/07/2010    Past Surgical History:  Procedure Laterality Date  . APPENDECTOMY    . arm surgery Left       Home Meds: Prior to Admission medications   Medication Sig Start Date End Date Taking? Authorizing Provider  acetaminophen (TYLENOL) 325 MG tablet Take 325-650 mg by mouth every 6 (six) hours as needed  for mild pain.   Yes Historical Provider, MD  albuterol (PROVENTIL HFA;VENTOLIN HFA) 108 (90 BASE) MCG/ACT inhaler Inhale 1 puff into the lungs every 4 (four) hours as needed for wheezing or shortness of breath.   Yes Historical Provider, MD  ALPRAZolam Duanne Moron) 0.5 MG tablet Take 0.5 mg by mouth 3 (three) times daily as needed for anxiety.   Yes Historical Provider, MD  apixaban (ELIQUIS) 5 MG TABS tablet Take 1 tablet (5 mg total) by mouth 2 (two) times daily. 11/19/15  Yes Robbie Lis, MD  Cholecalciferol (VITAMIN D-3 PO) Take 1 capsule by mouth at bedtime.   Yes Historical Provider, MD  DULoxetine (CYMBALTA) 30 MG capsule Take 1 capsule (30 mg total) by mouth daily. 09/24/15  Yes Brunetta Jeans, PA-C  ergocalciferol (VITAMIN D2) 50000 units capsule Take 1 capsule  (50,000 Units total) by mouth every 30 (thirty) days. 08/29/15  Yes Brunetta Jeans, PA-C  HYDROcodone-acetaminophen (NORCO/VICODIN) 5-325 MG tablet Take 1-2 tablets by mouth every 6 (six) hours as needed for severe pain. 11/15/15  Yes Harlin Heys, MD  losartan (COZAAR) 25 MG tablet Take 1 tablet (25 mg total) by mouth daily. 11/20/15  Yes Robbie Lis, MD  metoprolol (LOPRESSOR) 50 MG tablet Take 1 tablet (50 mg total) by mouth 2 (two) times daily. 11/19/15  Yes Robbie Lis, MD  traMADol (ULTRAM) 50 MG tablet Take 50 mg by mouth at bedtime as needed (for pain).   Yes Historical Provider, MD  amLODipine (NORVASC) 10 MG tablet TAKE ONE TABLET BY MOUTH ONCE DAILY Patient not taking: Reported on 11/21/2015 11/05/15   Brunetta Jeans, PA-C  atenolol (TENORMIN) 100 MG tablet Take 1 tablet (100 mg total) by mouth daily. Patient not taking: Reported on 11/21/2015 09/19/15   Brunetta Jeans, PA-C  sertraline (ZOLOFT) 100 MG tablet Take 1 tablet (100 mg total) by mouth daily. Patient not taking: Reported on 11/21/2015 05/09/15   Brunetta Jeans, PA-C  valsartan (DIOVAN) 320 MG tablet TAKE ONE TABLET BY MOUTH ONCE DAILY Patient not taking: Reported on 11/21/2015 10/08/15   Brunetta Jeans, PA-C    Current Medications: . apixaban  5 mg Oral BID  . DULoxetine  30 mg Oral Daily  . losartan  25 mg Oral Daily  . metoprolol  50 mg Oral BID     Allergies:    Allergies  Allergen Reactions  . Ace Inhibitors Cough  . Ace Inhibitors Cough  . Hctz [Hydrochlorothiazide] Other (See Comments)    Caused drug-induced LUPUS  . Other Other (See Comments)    Unknown anti-inflammatory: Caused extremely aggressive behavior (not Prednisone)  . Oxycodone     hallucinations  . Oxycodone Other (See Comments)    Hallucinations  . Sulfa Antibiotics Nausea And Vomiting  . Sulfur Nausea And Vomiting  . Voltaren [Diclofenac Sodium] Other (See Comments)    Hypersensitivity    Social History:   The patient   reports that she quit smoking about 2 years ago. Her smoking use included Cigarettes. She has quit using smokeless tobacco. She reports that she drinks about 0.6 oz of alcohol per week . She reports that she does not use drugs.    Family History:   The patient's family history includes Cancer in her mother; Hypertension in her father; Stroke in her father.   ROS:  Please see the history of present illness.  ROS All other ROS reviewed and negative.      Vital Signs: Blood pressure Marland Kitchen)  145/83, pulse 91, temperature 98.6 F (37 C), temperature source Oral, resp. rate 20, height 5\' 7"  (1.702 m), weight 214 lb 15.2 oz (97.5 kg), SpO2 95 %.   PHYSICAL EXAM: General:  Well nourished, well developed, in no acute distress HEENT: normal Lymph: no adenopathy Neck: no JVD Endocrine:  No thryomegaly Vascular: No carotid bruits; FA pulses 2+ bilaterally without bruits  Cardiac:  normal S1, S2; RRR; no murmur  Lungs:  clear to auscultation bilaterally, no wheezing, rhonchi or rales  Abd: soft, nontender, no hepatomegaly  Ext: no edema Musculoskeletal:  No deformities, BUE and BLE strength normal and equal Skin: warm and dry  Neuro:  CNs 2-12 intact, no focal abnormalities noted Psych:  Normal affect    EKG:  Atrial fibrillation with anterolateral ST/T wave abnomality  Labs:  Recent Labs  11/24/15 0939 11/24/15 1246  TROPONINI 0.05* 0.04*   Lab Results  Component Value Date   WBC 9.6 11/24/2015   HGB 12.3 11/24/2015   HCT 37.7 11/24/2015   MCV 89.8 11/24/2015   PLT 374 11/24/2015    Recent Labs Lab 11/25/15 0402  NA 137  K 3.5  CL 101  CO2 26  BUN 11  CREATININE 0.87  CALCIUM 9.8  GLUCOSE 107*   No results found for: CHOL, HDL, LDLCALC, TRIG No results found for: DDIMER  Radiology/Studies:  Dg Chest 2 View  Result Date: 11/15/2015 CLINICAL DATA:  [place] today with pain in the lower back EXAM: CHEST  2 VIEW COMPARISON:  None. FINDINGS: No active infiltrate or effusion  is seen. Mediastinal and hilar contours are unremarkable. The heart is within upper limits normal. No acute bony abnormality is seen. IMPRESSION: No active lung disease. No fracture is seen. The heart is upper normal in size. Electronically Signed   By: Ivar Drape M.D.   On: 11/15/2015 15:12   Dg Lumbar Spine 2-3 Views  Result Date: 11/15/2015 CLINICAL DATA:  [place] today with pain in the back EXAM: LUMBAR SPINE - 2-3 VIEW COMPARISON:  None. FINDINGS: The lumbar vertebrae are in normal alignment. Intervertebral disc spaces appear normal with the exception of degenerative disc disease at L5-S1. However, there does appear to be a partial compression deformity of the anterior superior aspect of T12 vertebral body most likely acute. No retropulsion is seen. CT or MRI could be useful for further evaluation if indicated. IMPRESSION: 1. Suspect acute compression fracture of the anterior superior aspect of T12 vertebral body. Consider CT or MRI if further assessment is warranted. 2. Degenerative disc disease at L5-S1 Electronically Signed   By: Ivar Drape M.D.   On: 11/15/2015 15:14   Dg Pelvis 1-2 Views  Result Date: 11/15/2015 CLINICAL DATA:  [place] today with pain in the low back EXAM: PELVIS - 1-2 VIEW COMPARISON:  None. FINDINGS: Both hips are normal position with normal hip joint spaces. The pelvic rami are intact. The SI joints are corticated. The sacral foramina appear normal. No acute fracture is seen. IMPRESSION: Negative. Electronically Signed   By: Ivar Drape M.D.   On: 11/15/2015 15:15   Ct Thoracic Spine Wo Contrast  Result Date: 11/15/2015 CLINICAL DATA:  Fall today. Back pain. T12 compression fracture on radiographs. Initial encounter. EXAM: CT THORACIC SPINE WITHOUT CONTRAST TECHNIQUE: Multidetector CT imaging of the thoracic spine was performed without intravenous contrast administration. Multiplanar CT image reconstructions were also generated. COMPARISON:  Chest and lumbar spine radiographs  earlier today FINDINGS: Alignment: No subluxation. Vertebrae: As seen on earlier radiographs,  there is a fracture of T12 involving the superior endplate and anterior vertebral body with minimal vertebral body height loss predominantly on the left. There is no retropulsion or posterior element fracture identified. No additional thoracic spine fracture is identified. Paraspinal and other soft tissues: Aortic atherosclerosis without aneurysm. Small sliding hiatal hernia. Coronary artery atherosclerosis. Disc levels: Mild thoracic spondylosis. No evidence of significant spinal stenosis on this non myelographic examination. Mild to moderate left neural foraminal stenosis at T7-8 due to facet arthrosis. IMPRESSION: 1. Acute appearing T12 compression fracture with minimal height loss. 2. Aortic atherosclerosis. Electronically Signed   By: Logan Bores M.D.   On: 11/15/2015 16:50     PROBLEM LIST:  Principal Problem:   Atrial fibrillation with RVR (HCC) Active Problems:   Essential hypertension   Thoracic compression fracture (HCC)   Elevated troponin I level     ASSESSMENT AND PLAN:  1. Recurrent afib with RVR - initial episode 11/8 but spontaneously converted to NSR and was sent home in NSR but had not followed up in afib clinic.  Now with recurrent afib with RVR.  She is very symptomatic when she is in afib and last admission was hypotensive on admission in afib.  She has been on Eliquis since 11/8 and has not missed a dose.  Her husband says that she has had some falls but they are due to tripping over the dog or the rub.  I think her risk of Caredioembolic event ([id number] score is 4 HTN/female/h/o CHF) outweigh risk of bleeding and would continue on NOAC.  Will make NPO after MN for TEE/DCCV in am.  Risks of procedure explained including the risk of bleeding, aspiration, esophageal perforation, risk of anesthesia or need for temporary pacing post DCCV.  Patient understands and wishes to proceed.   Will need close followup in EP clinic.  Continue Cardizem IV for now and change to PO after DCCV.    2.  HTN - BP controlled.  She has had problems with hypotension in the past on multiple BP meds.  She needs to be on BB and CCB to prevent further afib after DCCV so would stop cozaar to avoid future hypotension.    3.  T12 thoracic compression fx  4.  Chronic diastolic CHF - appears euvolemic on exam.    5.  OSA on CPAP - encouraged compliance  6.  Elevated troponin - this is minimally elevated in setting of afib with RVR and recent nuclear stress test with no ischemia and normal LVF by echo.  No further workup at this time as this is likely demand ischemia from afib with RVR   Signed, Richardson Dopp, PA-C  11/25/2015 1:51 PM

## 2015-11-25 NOTE — Progress Notes (Signed)
PROGRESS NOTE    Shannon [name]  [id number] DOB: [birth year] DOA: 11/24/2015 PCP: Leeanne Rio, PA-C  Brief Narrative:  Shannon [name] is a 65 y.o. female with a history of CHF, GERD, hypertension, hyperlipidemia, drug-induced lupus with HCTZ, recent T12 compression fracture, A. fib on Eliquis who was admitted from 11/9 until 11/13 for new onset A. fib. She has not followed up with A. fib clinic after her recent hospitalization. She has been feeling fine until this morning when she woke up, took her hydrocodone and went back to sleep. She woke up with palpitations and had her husband checked her blood pressure and heart rate and it was noted to be elevated. Patient was brought to the hospital for evaluation and found to be in Atrial Fibrillation with RVR. She was started on a Cardizem gtt and admitted and Cardiology was consulted for further evaluation and recommendations. Plan is to undergo TEE/DCCV in Am.  Assessment & Plan:   Principal Problem:   Atrial fibrillation with RVR (HCC) Active Problems:   Essential hypertension   Thoracic compression fracture (HCC)   Elevated troponin I level  Paroxysmal A fibrillation with RVR -On Cardizem gtt; Repeat Episode -C/w Apixaban for Anticoagulation as CHADSVASC is 4 -Cardiology Consulted for Further Evaluation and Recc's -Per Cardiology continue IV Cardizem and switch tomorrow to po. -C/w Metoprolol 50 mg po BID -Patient to undergo TEE/DCCV in Am; Will be Made NPO at Midnight  Elevated Troponin in the Setting of Atrial Fibrillation -Troponin I went from 0.05 -> 0.04;  -Likely Demand Ischemia from A Fib RVR -Recent Nuclear Stress Test with Normal LV Function by ECHO and no Ischemia -No further Workup at this time per Cardiology  Obstructive Sleep Apnea -Maintain Compliance with CPAP -Likely caused A. Fib as patient did not wear CPAP Night before.  Essential Hypertension -Has Hx of Hypotension with Atrial  Fibrillation -Per Cardiology Will stop Losartan after DCCV -Will be placed on BB and CCB per Cardiology  Chronic Diastolic CHF -Currently not Decompensated -Continue to Monitor  Depression/Anxiety -C/w Alprazolam 0.5 mg po TIDprn -C/w Duloxetine 30 mg po Daily  Thoracic Compression Fracture at T12 -Pain Control with Hydrocodone/Acetaminophen 1-2 Tab po q6hprn for Severe Pain  Hx of Drug Induced Lupus -No longer on HCTZ. -Stable with No Active Issues at this point  DVT prophylaxis: Anticoagulated with Axpixaban Code Status: FULL Family Communication: No Family present at bedside during the encounter Disposition Plan: Cardioversion in AM and possibly Home after.   Consultants:   Cardiology Dr. Fransico Him  Procedures: TEE/DCCV in AM  Antimicrobials: None  Subjective: Seen and examined at bedside and stated she felt a lot better. No CP/N/V/Abdominal Pain. States she did not wear her CPAP the night she woke up with A.Fib RVR. No other concerns or complaints at this time.   Objective: Vitals:   11/24/15 2100 11/24/15 2117 11/25/15 0501 11/25/15 1415  BP:  (!) 144/126 (!) 145/83 130/82  Pulse:  95 91 88  Resp:   20 20  Temp:  98.5 F (36.9 C) 98.6 F (37 C) 98.2 F (36.8 C)  TempSrc:  Oral Oral Oral  SpO2:  96% 95% 97%  Weight: 97.5 kg (214 lb 15.2 oz)     Height: 5\' 7"  (1.702 m)       Intake/Output Summary (Last 24 hours) at 11/25/15 1513 Last data filed at 11/25/15 1417  Gross per 24 hour  Intake  240 ml  Output             1300 ml  Net            -1060 ml   Filed Weights   11/24/15 0936 11/24/15 2100  Weight: 95.7 kg (211 lb) 97.5 kg (214 lb 15.2 oz)    Examination: Physical Exam:  Constitutional: WN/WD, NAD and appears calm and comfortable Eyes: Lids and conjunctivae normal, sclerae anicteric  ENMT: External Ears, Nose appear normal. Grossly normal hearing.  Neck: Appears normal, supple, no cervical masses, normal ROM, no appreciable  thyromegaly Respiratory: Clear to auscultation bilaterally, no wheezing, rales, rhonchi or crackles. Normal respiratory effort and patient is not tachypenic. No accessory muscle use.  Cardiovascular: Irregularly Irregular, Slight 2/6 Systolic Murmur. No rubs / gallops. S1 and S2 auscultated. No extremity edema. 2+ pedal pulses.  Abdomen: Soft, non-tender, non-distended. No masses palpated. No appreciable hepatosplenomegaly. Bowel sounds positive.  GU: Deferred. Musculoskeletal: No clubbing / cyanosis of digits/nails. No joint deformity upper and lower extremities.  Skin: No rashes, lesions, ulcers. No induration; Warm and dry.  Neurologic: CN 2-12 grossly intact with no focal deficits. Sensation intact in all 4 Extremities. Romberg sign cerebellar reflexes not assessed.  Psychiatric: Normal judgment and insight. Alert and oriented x 3. Normal mood and appropriate affect.   Data Reviewed: I have personally reviewed following labs and imaging studies  CBC:  Recent Labs Lab 11/19/15 0554 11/24/15 0939  WBC 8.2 9.6  NEUTROABS  --  6.5  HGB 11.7* 12.3  HCT 36.2 37.7  MCV 90.3 89.8  PLT 335 [id number]   Basic Metabolic Panel:  Recent Labs Lab 11/24/15 0939 11/25/15 0402  NA 140 137  K 3.9 3.5  CL 106 101  CO2 24 26  GLUCOSE 125* 107*  BUN <5* 11  CREATININE 0.64 0.87  CALCIUM 9.4 9.8   GFR: Estimated Creatinine Clearance: 78.4 mL/min (by C-G formula based on SCr of 0.87 mg/dL). Liver Function Tests: No results for input(s): AST, ALT, ALKPHOS, BILITOT, PROT, ALBUMIN in the last 168 hours. No results for input(s): LIPASE, AMYLASE in the last 168 hours. No results for input(s): AMMONIA in the last 168 hours. Coagulation Profile: No results for input(s): INR, PROTIME in the last 168 hours. Cardiac Enzymes:  Recent Labs Lab 11/24/15 0939 11/24/15 1246  TROPONINI 0.05* 0.04*   BNP (last 3 results) No results for input(s): PROBNP in the last 8760 hours. HbA1C: No results for  input(s): HGBA1C in the last 72 hours. CBG: No results for input(s): GLUCAP in the last 168 hours. Lipid Profile: No results for input(s): CHOL, HDL, LDLCALC, TRIG, CHOLHDL, LDLDIRECT in the last 72 hours. Thyroid Function Tests: No results for input(s): TSH, T4TOTAL, FREET4, T3FREE, THYROIDAB in the last 72 hours. Anemia Panel: No results for input(s): VITAMINB12, FOLATE, FERRITIN, TIBC, IRON, RETICCTPCT in the last 72 hours. Sepsis Labs: No results for input(s): PROCALCITON, LATICACIDVEN in the last 168 hours.  No results found for this or any previous visit (from the past 240 hour(s)).   Radiology Studies: No results found.  Scheduled Meds: . apixaban  5 mg Oral BID  . DULoxetine  30 mg Oral Daily  . metoprolol  50 mg Oral BID  . sodium chloride flush  3 mL Intravenous Q12H   Continuous Infusions: . sodium chloride    . diltiazem (CARDIZEM) infusion 5 mg/hr (11/25/15 1438)    LOS: 0 days   Kerney Elbe, DO [facility] Pager [phone]  If  7PM-7AM, please contact night-coverage [email or web address] Password [email or web address] 11/25/2015, 3:13 PM

## 2015-11-25 NOTE — Discharge Planning (Signed)
Wasted one Vicodin in Radiation protection practitioner with Jeneen Rinks, Therapist, sports.

## 2015-11-25 NOTE — Progress Notes (Signed)
Patient admitted from ED, continues on Cardizem drip. HR in the 120's with exertion. Oriented to room and surroundings. Spouse by the bedside. Denies pain at this time.

## 2015-11-25 NOTE — Progress Notes (Signed)
HR continues in the low 90's to 100. Patient denies any pain. Cardizem drip continues on 70ml/hr. Systolic BP in the [id number]. ' this is the best I ever felt ", patient said. Denies any pain at this time. Will continue to monitor.

## 2015-11-25 NOTE — Progress Notes (Signed)
   11/25/15 0014  BiPAP/CPAP/SIPAP  BiPAP/CPAP/SIPAP Pt Type Adult  Mask Type Nasal pillows  Set Rate 0 breaths/min  Respiratory Rate 15 breaths/min  Oxygen Percent 21 %  Flow Rate 0 lpm  BiPAP/CPAP/SIPAP CPAP  Patient Home Equipment Yes  Auto Titrate Yes  Assist patient with her CPAP home unit and home settings.

## 2015-11-26 ENCOUNTER — Ambulatory Visit: Payer: Self-pay | Admitting: Physician Assistant

## 2015-11-26 LAB — COMPREHENSIVE METABOLIC PANEL
ALBUMIN: 3.3 g/dL — AB (ref 3.5–5.0)
ALT: 22 U/L (ref 14–54)
ANION GAP: 7 (ref 5–15)
AST: 18 U/L (ref 15–41)
Alkaline Phosphatase: 76 U/L (ref 38–126)
BUN: 15 mg/dL (ref 6–20)
CHLORIDE: 98 mmol/L — AB (ref 101–111)
CO2: 29 mmol/L (ref 22–32)
CREATININE: 0.91 mg/dL (ref 0.44–1.00)
Calcium: 9.3 mg/dL (ref 8.9–10.3)
GFR calc non Af Amer: >60 mL/min (ref >60)
Glucose, Bld: 105 mg/dL — ABNORMAL HIGH (ref 65–99)
Potassium: 3.8 mmol/L (ref 3.5–5.1)
SODIUM: 134 mmol/L — AB (ref 135–145)
Total Bilirubin: 0.5 mg/dL (ref 0.3–1.2)
Total Protein: 6.3 g/dL — ABNORMAL LOW (ref 6.5–8.1)

## 2015-11-26 LAB — CBC WITH DIFFERENTIAL/PLATELET
BASOS PCT: 1 %
Basophils Absolute: 0.1 10*3/uL (ref 0.0–0.1)
EOS ABS: 0.6 10*3/uL (ref 0.0–0.7)
EOS PCT: 6 %
HCT: 32.9 % — ABNORMAL LOW (ref 36.0–46.0)
Hemoglobin: 10.7 g/dL — ABNORMAL LOW (ref 12.0–15.0)
LYMPHS ABS: 2.9 10*3/uL (ref 0.7–4.0)
Lymphocytes Relative: 28 %
MCH: 29.2 pg (ref 26.0–34.0)
MCHC: 32.5 g/dL (ref 30.0–36.0)
MCV: 89.9 fL (ref 78.0–100.0)
Monocytes Absolute: 0.8 10*3/uL (ref 0.1–1.0)
Monocytes Relative: 8 %
Neutro Abs: 6.1 10*3/uL (ref 1.7–7.7)
Neutrophils Relative %: 57 %
PLATELETS: 396 10*3/uL (ref 150–400)
RBC: 3.66 MIL/uL — AB (ref 3.87–5.11)
RDW: 13.6 % (ref 11.5–15.5)
WBC: 10.5 10*3/uL (ref 4.0–10.5)

## 2015-11-26 LAB — MAGNESIUM: Magnesium: 2.3 mg/dL (ref 1.7–2.4)

## 2015-11-26 LAB — PHOSPHORUS: PHOSPHORUS: 4.6 mg/dL (ref 2.5–4.6)

## 2015-11-26 MED ORDER — DILTIAZEM HCL ER COATED BEADS 120 MG PO CP24
120.0000 mg | ORAL_CAPSULE | Freq: Every day | ORAL | Status: DC
  Administered 2015-11-26: 120 mg via ORAL
  Filled 2015-11-26: qty 1

## 2015-11-26 MED ORDER — DILTIAZEM HCL ER COATED BEADS 120 MG PO CP24
120.0000 mg | ORAL_CAPSULE | Freq: Every day | ORAL | 0 refills | Status: DC
Start: 2015-11-26 — End: 2015-12-05

## 2015-11-26 NOTE — Progress Notes (Signed)
Pt converted over to NSR 1439. Fransico Him, MD now notified of pt's conversion. Pt currently in sinus brady. Orders are to stop cardizem drip.

## 2015-11-26 NOTE — Discharge Summary (Signed)
Physician Discharge Summary  Shannon [name] DOB: 07/16/1950 DOA: 11/24/2015  PCP: Leeanne Rio, PA-C  Admit date: 11/24/2015 Discharge date: 11/26/2015  Admitted From: Home Disposition:  Home with Resumption of Home PT  Recommendations for Outpatient Follow-up:  1. Follow up with PCP in 1-2 weeks 2. Follow up with Cardiology in 1-2 Weeks 3. Follow up with Atrial Fibrillation Clinic and EP Clinic 4. Please obtain BMP/CBC in one week  Home Health: Yes Equipment/Devices: [facility]  Discharge Condition: Stable CODE STATUS: FULL Diet recommendation: Heart Healthy  Brief/Interim Summary: Shannon Kassebaum Wilsonis a 65 y.o.femalewith a history of CHF, GERD, hypertension, hyperlipidemia, drug-induced lupus with HCTZ, recent T12 compression fracture, A. fib on Eliquis who was admitted from 11/9 until 11/13 for new onset A. fib. She has not followed up with A. fib clinic after her recent hospitalization. She has been feeling fine until this morning when she woke up, took her hydrocodone and went back to sleep. She woke up with palpitations and had her husband checked her blood pressure and heart rate and it was noted to be elevated. Patient was brought to the hospital for evaluation and found to be in Atrial Fibrillation with RVR. She was started on a Cardizem gtt and admitted and Cardiology was consulted for further evaluation and recommendations. She was to undergo TEE/DCCV in this Am but spontaneously converted to NSR. Cardiology evaluated her and placed her on po Cardizem 120 mg daily with Follow up with [facility] Range in the next 7-10 days. She was medically stable for D/C at this point and will follow up with PCP.    Discharge Diagnoses:  Principal Problem:   Atrial fibrillation with RVR (HCC) Active Problems:   Essential hypertension   Thoracic compression fracture (HCC)   Elevated troponin I level  Paroxysmal Afibrillation with RVR -C/w  Apixaban for Anticoagulation as CHADSVASC is 4 -Cardiology Consulted for Further Evaluation and Recc's -C/w Metoprolol 50 mg po BID -Spontaneously conversion to NSR -Started Cardizem 120 mg po Daily. -Follow up with EP Clinic, [facility], and with Cardiology as an outpatient -Appointments with Cardiology [facility] Range in 7-10 days and with Dr. Johnsie Cancel in 6-9 weeks  Elevated Troponin in the Setting of Atrial Fibrillation -Troponin I went from 0.05 -> 0.04;  -Likely Demand Ischemia from A Fib RVR -Recent Nuclear Stress Test with Normal LV Function by ECHO and no Ischemia -No further Workup at this time per Cardiology  Obstructive Sleep Apnea -Maintain Compliance with CPAP -Likely caused A. Fib as patient did not wear CPAP Night before.  Essential Hypertension -Has Hx of Hypotension with Atrial Fibrillation -Per Cardiology Will stop Losartan after DCCV however spontaneously converted so resumed -Will be placed on BB and CCB per Cardiology -C/w Losartan as an outpatient have Cardiology evaluate the need for it.   Chronic Diastolic CHF -Currently not Decompensated -Continue to Monitor  Depression/Anxiety -C/w Alprazolam 0.5 mg po TIDprn -C/w Duloxetine 30 mg po Daily  Thoracic Compression Fracture at T12 -Pain Control with Hydrocodone/Acetaminophen 1-2 Tab po q6hprn for Severe Pain  Hx of Drug Induced Lupus -No longer on HCTZ. -Stable with No Active Issues at this point  Discharge Instructions  Discharge Instructions    Call MD for:  difficulty breathing, headache or visual disturbances    Complete by:  As directed    Call MD for:  persistant dizziness or light-headedness    Complete by:  As directed    Call MD for:  persistant nausea  and vomiting    Complete by:  As directed    Call MD for:  severe uncontrolled pain    Complete by:  As directed    Diet - low sodium heart healthy    Complete by:  As directed    Discharge instructions    Complete  by:  As directed    Follow up with PCP and with Cardiology within 1 week. If symptoms change or worsen. Please return to the ER for Evaluation. .   Increase activity slowly    Complete by:  As directed        Medication List    STOP taking these medications   amLODipine 10 MG tablet Commonly known as:  NORVASC   atenolol 100 MG tablet Commonly known as:  TENORMIN   sertraline 100 MG tablet Commonly known as:  ZOLOFT   valsartan 320 MG tablet Commonly known as:  DIOVAN     TAKE these medications   acetaminophen 325 MG tablet Commonly known as:  TYLENOL Take 325-650 mg by mouth every 6 (six) hours as needed for mild pain.   albuterol 108 (90 Base) MCG/ACT inhaler Commonly known as:  PROVENTIL HFA;VENTOLIN HFA Inhale 1 puff into the lungs every 4 (four) hours as needed for wheezing or shortness of breath.   ALPRAZolam 0.5 MG tablet Commonly known as:  XANAX Take 0.5 mg by mouth 3 (three) times daily as needed for anxiety.   apixaban 5 MG Tabs tablet Commonly known as:  ELIQUIS Take 1 tablet (5 mg total) by mouth 2 (two) times daily.   diltiazem 120 MG 24 hr capsule Commonly known as:  CARDIZEM CD Take 1 capsule (120 mg total) by mouth daily.   DULoxetine 30 MG capsule Commonly known as:  CYMBALTA Take 1 capsule (30 mg total) by mouth daily.   ergocalciferol 50000 units capsule Commonly known as:  VITAMIN D2 Take 1 capsule (50,000 Units total) by mouth every 30 (thirty) days.   HYDROcodone-acetaminophen 5-325 MG tablet Commonly known as:  NORCO/VICODIN Take 1-2 tablets by mouth every 6 (six) hours as needed for severe pain.   losartan 25 MG tablet Commonly known as:  COZAAR Take 1 tablet (25 mg total) by mouth daily.   metoprolol 50 MG tablet Commonly known as:  LOPRESSOR Take 1 tablet (50 mg total) by mouth 2 (two) times daily.   traMADol 50 MG tablet Commonly known as:  ULTRAM Take 50 mg by mouth at bedtime as needed (for pain).   VITAMIN D-3  PO Take 1 capsule by mouth at bedtime.      Follow-up Information    [facility] Follow up.   Specialty:  Cardiology Why:  WOUND CHECK  Contact information: [address], [place]       Leeanne Rio, PA-C Follow up.   Specialty:  Family Medicine Contact information: [address] [place] [address] [phone]          Allergies  Allergen Reactions  . Ace Inhibitors Cough  . Ace Inhibitors Cough  . Hctz [Hydrochlorothiazide] Other (See Comments)    Caused drug-induced LUPUS  . Other Other (See Comments)    Unknown anti-inflammatory: Caused extremely aggressive behavior (not Prednisone)  . Oxycodone     hallucinations  . Oxycodone Other (See Comments)    Hallucinations  . Sulfa Antibiotics Nausea And Vomiting  . Sulfur Nausea And Vomiting  . Voltaren [Diclofenac Sodium]  Other (See Comments)    Hypersensitivity    Consultations:  Cardiology  Procedures/Studies: Dg Chest 2 View  Result Date: 11/15/2015 CLINICAL DATA:  [place] today with pain in the lower back EXAM: CHEST  2 VIEW COMPARISON:  None. FINDINGS: No active infiltrate or effusion is seen. Mediastinal and hilar contours are unremarkable. The heart is within upper limits normal. No acute bony abnormality is seen. IMPRESSION: No active lung disease. No fracture is seen. The heart is upper normal in size. Electronically Signed   By: Ivar Drape M.D.   On: 11/15/2015 15:12   Dg Lumbar Spine 2-3 Views  Result Date: 11/15/2015 CLINICAL DATA:  [place] today with pain in the back EXAM: LUMBAR SPINE - 2-3 VIEW COMPARISON:  None. FINDINGS: The lumbar vertebrae are in normal alignment. Intervertebral disc spaces appear normal with the exception of degenerative disc disease at L5-S1. However, there does appear to be a partial compression deformity of the anterior superior aspect of T12 vertebral body most likely acute. No  retropulsion is seen. CT or MRI could be useful for further evaluation if indicated. IMPRESSION: 1. Suspect acute compression fracture of the anterior superior aspect of T12 vertebral body. Consider CT or MRI if further assessment is warranted. 2. Degenerative disc disease at L5-S1 Electronically Signed   By: Ivar Drape M.D.   On: 11/15/2015 15:14   Dg Pelvis 1-2 Views  Result Date: 11/15/2015 CLINICAL DATA:  [place] today with pain in the low back EXAM: PELVIS - 1-2 VIEW COMPARISON:  None. FINDINGS: Both hips are normal position with normal hip joint spaces. The pelvic rami are intact. The SI joints are corticated. The sacral foramina appear normal. No acute fracture is seen. IMPRESSION: Negative. Electronically Signed   By: Ivar Drape M.D.   On: 11/15/2015 15:15   Ct Thoracic Spine Wo Contrast  Result Date: 11/15/2015 CLINICAL DATA:  Fall today. Back pain. T12 compression fracture on radiographs. Initial encounter. EXAM: CT THORACIC SPINE WITHOUT CONTRAST TECHNIQUE: Multidetector CT imaging of the thoracic spine was performed without intravenous contrast administration. Multiplanar CT image reconstructions were also generated. COMPARISON:  Chest and lumbar spine radiographs earlier today FINDINGS: Alignment: No subluxation. Vertebrae: As seen on earlier radiographs, there is a fracture of T12 involving the superior endplate and anterior vertebral body with minimal vertebral body height loss predominantly on the left. There is no retropulsion or posterior element fracture identified. No additional thoracic spine fracture is identified. Paraspinal and other soft tissues: Aortic atherosclerosis without aneurysm. Small sliding hiatal hernia. Coronary artery atherosclerosis. Disc levels: Mild thoracic spondylosis. No evidence of significant spinal stenosis on this non myelographic examination. Mild to moderate left neural foraminal stenosis at T7-8 due to facet arthrosis. IMPRESSION: 1. Acute appearing T12  compression fracture with minimal height loss. 2. Aortic atherosclerosis. Electronically Signed   By: Logan Bores M.D.   On: 11/15/2015 16:50    Subjective: Seen and examined at bedside and doing well. No other concerns or complaints at this time. No N/V/Abdominal Pain or Cp. Ready to be D/C'd home.   Discharge Exam: Vitals:   11/26/15 0209 11/26/15 0455  BP: (!) 149/68 (!) 146/72  Pulse: 62 70  Resp:  20  Temp:  98.3 F (36.8 C)   Vitals:   11/25/15 1415 11/25/15 2030 11/26/15 0209 11/26/15 0455  BP: 130/82 137/63 (!) 149/68 (!) 146/72  Pulse: 88 70 62 70  Resp: 20 20  20   Temp: 98.2 F (36.8 C) 98.4 F (36.9 C)  98.3 F (36.8 C)  TempSrc: Oral Oral  Oral  SpO2: 97% 96%  98%  Weight:      Height:       General: Pt is alert, awake, not in acute distress Cardiovascular: RRR, S1/S2 +, no rubs, no gallops Respiratory: CTA bilaterally, no wheezing, no rhonchi Abdominal: Soft, NT, ND, bowel sounds + Extremities: no edema, no cyanosis   The results of significant diagnostics from this hospitalization (including imaging, microbiology, ancillary and laboratory) are listed below for reference.    Microbiology: No results found for this or any previous visit (from the past 240 hour(s)).   Labs: BNP (last 3 results) No results for input(s): BNP in the last 8760 hours. Basic Metabolic Panel:  Recent Labs Lab 11/24/15 0939 11/25/15 0402 11/26/15 0221  NA 140 137 134*  K 3.9 3.5 3.8  CL 106 101 98*  CO2 24 26 29   GLUCOSE 125* 107* 105*  BUN <5* 11 15  CREATININE 0.64 0.87 0.91  CALCIUM 9.4 9.8 9.3  MG  --   --  2.3  PHOS  --   --  4.6   Liver Function Tests:  Recent Labs Lab 11/26/15 0221  AST 18  ALT 22  ALKPHOS 76  BILITOT 0.5  PROT 6.3*  ALBUMIN 3.3*   No results for input(s): LIPASE, AMYLASE in the last 168 hours. No results for input(s): AMMONIA in the last 168 hours. CBC:  Recent Labs Lab 11/24/15 0939 11/26/15 0221  WBC 9.6 10.5  NEUTROABS  6.5 6.1  HGB 12.3 10.7*  HCT 37.7 32.9*  MCV 89.8 89.9  PLT 374 396   Cardiac Enzymes:  Recent Labs Lab 11/24/15 0939 11/24/15 1246  TROPONINI 0.05* 0.04*   BNP: Invalid input(s): POCBNP CBG: No results for input(s): GLUCAP in the last 168 hours. D-Dimer No results for input(s): DDIMER in the last 72 hours. Hgb A1c No results for input(s): HGBA1C in the last 72 hours. Lipid Profile No results for input(s): CHOL, HDL, LDLCALC, TRIG, CHOLHDL, LDLDIRECT in the last 72 hours. Thyroid function studies No results for input(s): TSH, T4TOTAL, T3FREE, THYROIDAB in the last 72 hours.  Invalid input(s): FREET3 Anemia work up No results for input(s): VITAMINB12, FOLATE, FERRITIN, TIBC, IRON, RETICCTPCT in the last 72 hours. Urinalysis    Component Value Date/Time   COLORURINE YELLOW 11/15/2015 1838   APPEARANCEUR HAZY (A) 11/15/2015 1838   LABSPEC 1.024 11/15/2015 1838   PHURINE 5.5 11/15/2015 1838   GLUCOSEU NEGATIVE 11/15/2015 1838   HGBUR NEGATIVE 11/15/2015 1838   BILIRUBINUR SMALL (A) 11/15/2015 1838   KETONESUR 15 (A) 11/15/2015 1838   PROTEINUR 30 (A) 11/15/2015 1838   NITRITE NEGATIVE 11/15/2015 1838   LEUKOCYTESUR NEGATIVE 11/15/2015 1838   Sepsis Labs Invalid input(s): PROCALCITONIN,  WBC,  LACTICIDVEN Microbiology No results found for this or any previous visit (from the past 240 hour(s)).  Time coordinating discharge: Over 30 minutes  SIGNED:  Kerney Elbe, DO [facility] 11/26/2015, 11:46 AM Pager [phone]  If 7PM-7AM, please contact night-coverage [email or web address] Password [email or web address]

## 2015-11-26 NOTE — Care Management Note (Signed)
Case Management Note Marvetta Gibbons RN, BSN Unit 2W-Case Manager [phone]  Patient Details  Name: Shannon [name] MRN: [id number] Date of Birth: April 08, 1950  Subjective/Objective:   Pt admitted with afib-new                 Action/Plan: PTA pt lived at home with spouse- per Santiago Glad with [facility] was active with HHPT- order placed to resume [place] with [facility] notified of discharge and resumption of services for home.   Expected Discharge Date:  11/26/15               Expected Discharge Plan:  [place]  In-House Referral:     Discharge planning Services  CM Consult  Post Acute Care Choice:  Home Health, Resumption of Svcs/PTA Provider Choice offered to:  Patient  DME Arranged:    DME Agency:     HH Arranged:  PT [place]:  [place]  Status of Service:  Completed, signed off  If discussed at [place] of Stay Meetings, dates discussed:    Additional Comments:  Dawayne Patricia, RN 11/26/2015, 2:44 PM

## 2015-11-26 NOTE — Progress Notes (Signed)
Order received to discharge patient.  Patient expresses readiness to discharge.  Telemetry monitor removed and CCMD notified.  PIV access removed without difficulty.  Discharge instructions, follow up, medications and instructions for their use were discussed with patient and patient voiced understanding.  

## 2015-11-26 NOTE — Progress Notes (Signed)
This RN administered 1 tablet of vicodin to pt. This pt's assigned RN was at lunch. The pt wanted 2 tablets originally, but decided that she just wanted 1 tablet after both tablets had been opened. This RN wasted 1 tablet in the sharps container and 1 tablet was given to the pt. Delsa Sale, RN witnessed this waste.   Grant Fontana BSN, RN

## 2015-11-26 NOTE — Progress Notes (Signed)
Patient Name: Shannon [name] Date of Encounter: 11/26/2015  Primary Cardiologist: Dr. Debbe Odea Problem List     Principal Problem:   Atrial fibrillation with RVR [facility]) Active Problems:   Essential hypertension   Thoracic compression fracture (HCC)   Elevated troponin I level    Patient Profile     Shannon [name] is a 65 y.o. female with a hx of HTN, GERD, OSA, and depression.  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 Nuclear stress test done for chest pain in 9/17 was low risk with EF 48 and no ischemia and subsequent Echo was done and demonstrated normal ejection fraction but mod diastolic dysfunction.   She was admitted 11/17 with significant orthostatic hypotension in the setting of a fall at home.  CT of the thoracic spine revealed a T12 compression fracture and a brace was placed.   She had a brief episode of afib with RVR and converted spontaneously on her own. Was placed on Eliquis. Discharged home on 11/19/15 and presented back on 11/24/15 with chest pain, found to be in afib w/ RVR.    Subjective   Feels better. Converted to NSR.   Inpatient Medications    Scheduled Meds: . apixaban  5 mg Oral BID  . DULoxetine  30 mg Oral Daily  . metoprolol  50 mg Oral BID   Continuous Infusions: . diltiazem (CARDIZEM) infusion Stopped (11/26/15 0213)   PRN Meds: acetaminophen, albuterol, ALPRAZolam, HYDROcodone-acetaminophen, magnesium hydroxide, ondansetron (ZOFRAN) IV   Vital Signs    Vitals:   11/25/15 1415 11/25/15 2030 11/26/15 0209 11/26/15 0455  BP: 130/82 137/63 (!) 149/68 (!) 146/72  Pulse: 88 70 62 70  Resp: 20 20  20   Temp: 98.2 F (36.8 C) 98.4 F (36.9 C)  98.3 F (36.8 C)  TempSrc: Oral Oral  Oral  SpO2: 97% 96%  98%  Weight:      Height:        Intake/Output Summary (Last 24 hours) at 11/26/15 0850 Last data filed at 11/25/15 1417  Gross per 24 hour  Intake               240 ml  Output                0 ml  Net              240 ml   Filed Weights   11/24/15 0936 11/24/15 2100  Weight: 211 lb (95.7 kg) 214 lb 15.2 oz (97.5 kg)    Physical Exam   GEN: Well nourished, well developed, in no acute distress.  HEENT: Grossly normal.  Neck: Supple, no JVD, carotid bruits, or masses. Cardiac: RRR, no murmurs, rubs, or gallops. No clubbing, cyanosis, edema.  Radials/DP/PT 2+ and equal bilaterally.  Respiratory:  Respirations regular and unlabored, clear to auscultation bilaterally. GI: Soft, nontender, nondistended, BS + x 4. MS: no deformity or atrophy. Skin: warm and dry, no rash. Neuro:  Strength and sensation are intact. Psych: AAOx3.  Normal affect.  Labs    CBC  Recent Labs  11/24/15 0939 11/26/15 0221  WBC 9.6 10.5  NEUTROABS 6.5 6.1  HGB 12.3 10.7*  HCT 37.7 32.9*  MCV 89.8 89.9  PLT 374 [id number]   Basic Metabolic Panel  Recent Labs  11/25/15 0402 11/26/15 0221  NA 137 134*  K 3.5 3.8  CL 101 98*  CO2 26 29  GLUCOSE 107* 105*  BUN 11 15  CREATININE 0.87 0.91  CALCIUM 9.8 9.3  MG  --  2.3  PHOS  --  4.6   Liver Function Tests  Recent Labs  11/26/15 0221  AST 18  ALT 22  ALKPHOS 76  BILITOT 0.5  PROT 6.3*  ALBUMIN 3.3*   No results for input(s): LIPASE, AMYLASE in the last 72 hours. Cardiac Enzymes  Recent Labs  11/24/15 0939 11/24/15 1246  TROPONINI 0.05* 0.04*   BNP Invalid input(s): POCBNP D-Dimer No results for input(s): DDIMER in the last 72 hours. Hemoglobin A1C No results for input(s): HGBA1C in the last 72 hours. Fasting Lipid Panel No results for input(s): CHOL, HDL, LDLCALC, TRIG, CHOLHDL, LDLDIRECT in the last 72 hours. Thyroid Function Tests No results for input(s): TSH, T4TOTAL, T3FREE, THYROIDAB in the last 72 hours.  Invalid input(s): FREET3  Telemetry    NSR 70s - Personally Reviewed  Radiology    No results found.    Patient Profile     Shannon [name] is a 65  y.o. female with a hx of HTN, GERD, OSA, and depression.  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 Nuclear stress test done for chest pain in 9/17 was low risk with EF 48 and no ischemia and subsequent Echo was done and demonstrated normal ejection fraction but mod diastolic dysfunction.   She was admitted 11/17 with significant orthostatic hypotension in the setting of a fall at home.  CT of the thoracic spine revealed a T12 compression fracture and a brace was placed.   She had a brief episode of afib with RVR and converted spontaneously on her own. Was placed on Eliquis. Discharged home on 11/19/15 and presented back on 11/24/15 with chest pain, found to be in afib w/ RVR.   Assessment & Plan    1. Recurrent afib with RVR - initial episode 11/8 but spontaneously converted to NSR and was sent home in NSR but had not followed up in afib clinic.  Now with recurrent afib with RVR.  She is very symptomatic when she is in afib and last admission was hypotensive on admission in afib.  She has been on Eliquis since 11/8 and has not missed a dose.  Her  risk of Caredioembolic event ([id number] score is 4 HTN/female/h/o CHF) outweigh risk of bleeding and would continue on NOAC. Spontaneous conversion to NSR overnight. HR controlled in the 70s. Continue Metoprolol. Transition to PO Cardizem 120 mg daily. Will need close followup in EP clinic.    2.  HTN - BP controlled.  She has had problems with hypotension in the past on multiple BP meds.  She needs to be on BB and CCB to prevent further afib after DCCV. Add Cardizem 120 mg daily. Cozaar was discontinued to avoid future hypotension.    3.  T12 thoracic compression fx  4.  Chronic diastolic CHF - appears euvolemic on exam.    5.  OSA on CPAP - encouraged compliance  6.  Elevated troponin - this is minimally elevated in setting of afib with RVR and recent nuclear stress test with no ischemia  and normal LVF by echo.  No further workup at this time as this is likely demand ischemia from afib with RVR   Signed, Lyda Jester, PA-C  11/26/2015, 8:50 AM   Agree with note written by Ellen Henri  PAC  PAF converted to NSR. On Eliquis  Feels better. Would start on PO long acting Cardizem CD 120. Can be D/Cd home today. Follow up with [facility] 7-10 days then Dr Johnsie Cancel 6-9 weeks.  Quay Burow 11/26/2015 10:42 AM

## 2015-11-27 ENCOUNTER — Ambulatory Visit (INDEPENDENT_AMBULATORY_CARE_PROVIDER_SITE_OTHER): Payer: No Typology Code available for payment source | Admitting: Physician Assistant

## 2015-11-27 ENCOUNTER — Encounter: Payer: Self-pay | Admitting: Physician Assistant

## 2015-11-27 ENCOUNTER — Telehealth: Payer: Self-pay

## 2015-11-27 VITALS — BP 102/60 | HR 64 | Temp 98.8°F | Resp 16 | Ht 67.0 in | Wt 221.0 lb

## 2015-11-27 DIAGNOSIS — S22000S Wedge compression fracture of unspecified thoracic vertebra, sequela: Secondary | ICD-10-CM

## 2015-11-27 DIAGNOSIS — Z23 Encounter for immunization: Secondary | ICD-10-CM | POA: Diagnosis not present

## 2015-11-27 DIAGNOSIS — I4891 Unspecified atrial fibrillation: Secondary | ICD-10-CM | POA: Diagnosis not present

## 2015-11-27 DIAGNOSIS — D649 Anemia, unspecified: Secondary | ICD-10-CM

## 2015-11-27 MED ORDER — HYDROCODONE-ACETAMINOPHEN 5-325 MG PO TABS: 1.0000 | ORAL_TABLET | Freq: Three times a day (TID) | ORAL | 0 refills | Status: DC | PRN

## 2015-11-27 NOTE — Telephone Encounter (Signed)
Information provided by patients husband, [name] (pt unavailable).   Transition Care Management Follow-up Telephone Call   Date discharged? 11/26/15   How have you been since you were released from the hospital? "she's still in a lot of pain, still using her walker, but she hasn't had an increase in her heart rate or gone back into A-Fib"   Do you understand why you were in the hospital? Yes. Information on A-Fib emailed to patient via [facility] portal.    Do you understand the discharge instructions? yes   Where were you discharged to? Home   Items Reviewed:  Medications reviewed: yes  Allergies reviewed: yes  Dietary changes reviewed: yes, Heart Healthy diet reviewed. Heart healthy diet information emailed to pt via [facility] portal to [email or web address].   Referrals reviewed: yes, cardiology   Functional Questionnaire:   Activities of Daily Living (ADLs):   She states they are independent in the following: ambulation, bathing and hygiene, feeding, continence, grooming, toileting and dressing. Patient uses walker and needs assistance (from husband) getting in/out of shower d/t fall/compression fracture.  States they require assistance with the following: none   Any transportation issues/concerns?: no   Any patient concerns? yes, low Sodium level (134) while in hospital.    Confirmed importance and date/time of follow-up visits scheduled yes  Provider Appointment booked with PCP today (11/27/15 at 11).   Confirmed with patient if condition begins to worsen call PCP or go to the ER.  Patient was given the office number and encouraged to call back with question or concerns.  : yes

## 2015-11-27 NOTE — Patient Instructions (Signed)
Please call the [place] office to schedule lab appointment for next Monday.  Please continue medications as directed by Cardiology. Do not take more than as directed. Continue working on reducing intake of pain medication. Can switch to Tylenol as pain continued to improve. Continue Physical Therapy as scheduled.  Follow-up with me in 2 weeks. Follow-up with Cardiology as scheduled. Do not forget to call Cardiology to schedule follow-up with the A. Fib clinic.  If you note any chest pain, racing heart, shortness of breath, lightheadedness or dizziness, please call 911.
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
## 2015-11-27 NOTE — Progress Notes (Signed)
Pre visit review using our clinic review tool, if applicable. No additional management support is needed unless otherwise documented below in the visit note. 

## 2015-11-28 ENCOUNTER — Ambulatory Visit: Payer: PRIVATE HEALTH INSURANCE | Admitting: Cardiovascular Disease

## 2015-12-04 ENCOUNTER — Other Ambulatory Visit (INDEPENDENT_AMBULATORY_CARE_PROVIDER_SITE_OTHER): Payer: No Typology Code available for payment source

## 2015-12-04 DIAGNOSIS — D649 Anemia, unspecified: Secondary | ICD-10-CM | POA: Diagnosis not present

## 2015-12-04 LAB — BASIC METABOLIC PANEL
BUN: 10 mg/dL (ref 6–23)
CALCIUM: 9.4 mg/dL (ref 8.4–10.5)
CO2: 27 mEq/L (ref 19–32)
Chloride: 103 mEq/L (ref 96–112)
Creatinine, Ser: 0.78 mg/dL (ref 0.40–1.20)
GFR: 78.91 mL/min (ref >60.00)
Glucose, Bld: 118 mg/dL — ABNORMAL HIGH (ref 70–99)
POTASSIUM: 4 meq/L (ref 3.5–5.1)
SODIUM: 138 meq/L (ref 135–145)

## 2015-12-05 ENCOUNTER — Encounter: Payer: Self-pay | Admitting: Physician Assistant

## 2015-12-05 ENCOUNTER — Ambulatory Visit (INDEPENDENT_AMBULATORY_CARE_PROVIDER_SITE_OTHER): Payer: No Typology Code available for payment source | Admitting: Physician Assistant

## 2015-12-05 VITALS — BP 140/60 | HR 67 | Ht 67.0 in | Wt 223.4 lb

## 2015-12-05 DIAGNOSIS — I5189 Other ill-defined heart diseases: Secondary | ICD-10-CM

## 2015-12-05 DIAGNOSIS — I519 Heart disease, unspecified: Secondary | ICD-10-CM | POA: Diagnosis not present

## 2015-12-05 DIAGNOSIS — I10 Essential (primary) hypertension: Secondary | ICD-10-CM

## 2015-12-05 DIAGNOSIS — I951 Orthostatic hypotension: Secondary | ICD-10-CM

## 2015-12-05 DIAGNOSIS — I480 Paroxysmal atrial fibrillation: Secondary | ICD-10-CM

## 2015-12-05 LAB — CBC
HEMATOCRIT: 31.4 % — AB (ref 36.0–46.0)
Hemoglobin: 10.4 g/dL — ABNORMAL LOW (ref 12.0–15.0)
MCHC: 33.1 g/dL (ref 30.0–36.0)
MCV: 88.6 fl (ref 78.0–100.0)
Platelets: 393 10*3/uL (ref 150.0–400.0)
RBC: 3.55 Mil/uL — ABNORMAL LOW (ref 3.87–5.11)
RDW: 14.4 % (ref 11.5–15.5)
WBC: 8.1 10*3/uL (ref 4.0–10.5)

## 2015-12-05 NOTE — Progress Notes (Addendum)
Cardiology Office Note    Date:  12/05/2015  ID:  Shannon [name], DOB Dec 09, 1950, MRN [id number] PCP:  Shannon Rio, PA-C  Cardiologist:  Seen as a new patient 11/02/15 by Shannon [name] at which time she requested Shannon [name]   Chief Complaint: f/u afib  History of Present Illness:  Shannon [name] is a 65 y.o. 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  To recap hx, nuclear stress test done for chest pain in 9/17 was low risk with EF 48% and no ischemia. Subsequent echo was done 10/2015 demonstrating EF 55-60%, grade 2 DD. She was admitted 11/9-11/13/17 with fall and profound orthostatic hypotension felt due to antihypertensives, which were adjusted. She sustained a T12 compresison fx. She had a negative cosyntropine test. She had a brief episode of afib with RVR and converted spontaneously on her own and stated that she had had similar episodes in the past and thought it was anxiety. She was started on Eliquis. She was readmitted 11/18-11/19/17 with palpitations and recurrent atrial fib RVR. She was placed on IV cardizem with plans for TEE/DCCV but again spontaneously converted to NSR. She was subsequently transitioned to oral cardizem and discharged home. Troponin was 0.05, felt due to demand ischemia. Otherwise labs notable for K 3.8, Cr 0.91, glucose 105, Mg 2.3, Hgb 12.3. Thyroid function was normal in 05/2015. Repeat echo 11/18/15: mod LVH, EF 0000000, normal diastolic parameters, mild-mod LAE, mildly dilated RA. CHADSVASC is at least 2 for female and HTN (1 point was added in hospital for possible diastolic CHF although most recent echo showed normal diastolic parameters).  She returns for follow-up today with her husband. She is doing well. She has not had any recurrent palpitations on current regimen. She  denies chest pain or dyspnea. She has not been using her CPAP recently because it's been difficult with her back injury. She was compliant prior to the fracture though. She is happy to report she was actually discharged from PT after 2 visits because she was doing so well. She has not had any recurrent symptoms of orthostasis. No recurrent falls. She is still using a walker to be safe. It was recommended to stop her losartan to be able to maximize rate controlling meds but this was still on her discharge list - she denies taking it. No bleeding issues. PCP rechecked CBC/BMET yesterday, labs pending at this time.   Past Medical History:  Diagnosis Date  . Anxiety   . Arthritis    neck and back  . Degenerative disorder of bone   . Depression   . Diastolic dysfunction   . GERD (gastroesophageal reflux disease)   . Herniated disc, cervical   . Hyperlipidemia   . Hypertension   . Lupus    HCTZ induced  . 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 Obstructive sleep apnea   . Orthostatic hypotension   . Osteopenia   . PAF (paroxysmal atrial fibrillation) ([place])   . Pinched nerve in neck   . T12 compression fracture ([place]) 11/2015  . Vitamin D deficiency   . Whiplash injury 06/07/2010    Past Surgical History:  Procedure Laterality Date  . APPENDECTOMY    . arm surgery Left     Current Medications: Current Outpatient Prescriptions  Medication Sig Dispense Refill  .  acetaminophen (TYLENOL) 325 MG tablet Take 325-650 mg by mouth every 6 (six) hours as needed for mild pain.    Marland Kitchen albuterol (PROVENTIL HFA;VENTOLIN HFA) 108 (90 BASE) MCG/ACT inhaler Inhale 1 puff into the lungs every 4 (four) hours as needed for wheezing or shortness of breath.    . ALPRAZolam (XANAX) 0.5 MG tablet Take 0.5 mg by mouth 3 (three) times daily as needed for anxiety.    Marland Kitchen apixaban (ELIQUIS) 5 MG TABS tablet Take 1 tablet (5 mg total) by mouth 2 (two) times daily. 60  tablet 0  . Cholecalciferol (VITAMIN D-3 PO) Take 1 capsule by mouth at bedtime.    Marland Kitchen diltiazem (CARDIZEM) 120 MG tablet Take 120 mg by mouth 4 (four) times daily.    . DULoxetine (CYMBALTA) 30 MG capsule Take 1 capsule (30 mg total) by mouth daily. 30 capsule 5  . ergocalciferol (VITAMIN D2) 50000 units capsule Take 1 capsule (50,000 Units total) by mouth every 30 (thirty) days. 4 capsule 3  . HYDROcodone-acetaminophen (NORCO/VICODIN) 5-325 MG tablet Take 1 tablet by mouth every 8 (eight) hours as needed for severe pain. 30 tablet 0  . metoprolol (LOPRESSOR) 50 MG tablet Take 1 tablet (50 mg total) by mouth 2 (two) times daily. 60 tablet 0   No current facility-administered medications for this visit.      Allergies:   Ace inhibitors; Ace inhibitors; Hctz [hydrochlorothiazide]; Other; Oxycodone; Oxycodone; Sulfa antibiotics; Sulfur; and Voltaren [diclofenac sodium]   Social History   Social History  . Marital status: Married    Spouse name: N/A  . Number of children: N/A  . Years of education: N/A   Social History Main Topics  . Smoking status: Former Smoker    Types: Cigarettes    Quit date: 2015  . Smokeless tobacco: Former Systems developer     Comment: Pt started to use vaporizer-uses rarely  . Alcohol use 0.6 oz/week    1 Glasses of wine per week     Comment: social or occationally  . Drug use: No  . Sexual activity: Yes   Other Topics Concern  . None   Social History Narrative   ** Merged History Encounter **         Family History:  The patient's family history includes Cancer in her mother; Hypertension in her father; Stroke in her father.   ROS:   Please see the history of present illness. All other systems are reviewed and otherwise negative.    PHYSICAL EXAM:   VS:  BP 140/60   Pulse 67   Ht 5\' 7"  (1.702 m)   Wt 223 lb 6 oz (101.3 kg)   SpO2 95%   BMI 34.99 kg/m   BMI: Body mass index is 34.99 kg/m. GEN: Well nourished, well developed well appearing WF in no  acute distress  HEENT: normocephalic, atraumatic Neck: no JVD, carotid bruits, or masses Cardiac: RRR; no murmurs, rubs, or gallops, no edema  Respiratory:  clear to auscultation bilaterally, normal work of breathing GI: soft, nontender, nondistended, + BS MS: no deformity or atrophy  Skin: warm and dry, no rash Neuro:  Alert and Oriented x 3, Strength and sensation are intact, follows commands Psych: euthymic mood, full affect  Wt Readings from Last 3 Encounters:  12/05/15 223 lb 6 oz (101.3 kg)  11/27/15 221 lb (100.2 kg)  11/24/15 214 lb 15.2 oz (97.5 kg)      Studies/Labs Reviewed:   EKG:  EKG was ordered today and personally  reviewed by me and demonstrates sinus bradycardia 57bpm, nonspecific ST-T changes with TWI V2-V5, I, avL - appears stable  Recent Labs: 06/01/2015: TSH 0.60 11/26/2015: ALT 22; Magnesium 2.3 12/04/2015: BUN 10; Creatinine, Ser 0.78; Hemoglobin 10.4; Platelets 393.0; Potassium 4.0; Sodium 138   Lipid Panel No results found for: CHOL, TRIG, HDL, CHOLHDL, VLDL, LDLCALC, LDLDIRECT  Additional studies/ records that were reviewed today include: Summarized above.    ASSESSMENT & PLAN:   1. Paroxysmal atrial fib - maintaining NSR on current regimen. Long disussion about what atrial fib means and the various ways of helping control it. We discussed importance of healthy weight loss and compliance with OSA, as well as how we weigh risk of bleeding versus risk of stroke. Continue current medications. If she has any further episodes would need to consider antiarrhythmic therapy. We also discussed AliveCor as a possible monitoring mechanism. Addendum: Hgb returned slightly lower than prior - see PCP note - primary care plans to further follow and evaluate - appreciate their attention to detail. Sent message to PA about this, thanking him for following this and asking him to let us know if we can help in any way. 2. Diastolic dysfunction - no evidence of HF on exam.  Continue BP control.  3. Essential HTN - see below. 4. Orthostatic hypotension - no evidence of recurrence. Will remove losartan from med list. I would not press for stricter BP control given her history of profound orthostasis. Continue current regimen.  Disposition: F/u with Shannon [name] in 3 months. (The patient's husband is a patient of his.) At this time will defer afib clinic referral given controlled symptoms.   Medication Adjustments/Labs and Tests Ordered: Current medicines are reviewed at length with the patient today.  Concerns regarding medicines are outlined above. Medication changes, Labs and Tests ordered today are summarized above and listed in the Patient Instructions accessible in Encounters.   Raechel Ache PA-C  12/05/2015 3:18 PM    [facility] [place], [place], [place]  [address] Phone: [phone]; Fax: [phone]

## 2015-12-05 NOTE — Patient Instructions (Addendum)
Medication Instructions:  Your physician recommends that you continue on your current medications as directed. Please refer to the Current Medication list given to you today.   Labwork: None ordered  Testing/Procedures: None ordered  Follow-Up: Your physician recommends that you schedule a follow-up appointment in: 3 MONTHS WITH DR. NISHAN   Any Other Special Instructions Will Be Listed Below (If Applicable).   If you need a refill on your cardiac medications before your next appointment, please call your pharmacy.   

## 2015-12-07 ENCOUNTER — Observation Stay (HOSPITAL_COMMUNITY)
Admission: EM | Admit: 2015-12-07 | Discharge: 2015-12-08 | Disposition: A | Discharge status: Home / Self Care | Payer: No Typology Code available for payment source | Attending: Internal Medicine | Admitting: Internal Medicine

## 2015-12-07 ENCOUNTER — Encounter (HOSPITAL_COMMUNITY): Payer: Self-pay | Admitting: Emergency Medicine

## 2015-12-07 DIAGNOSIS — F329 Major depressive disorder, single episode, unspecified: Secondary | ICD-10-CM | POA: Diagnosis not present

## 2015-12-07 DIAGNOSIS — M5137 Other intervertebral disc degeneration, lumbosacral region: Secondary | ICD-10-CM | POA: Insufficient documentation

## 2015-12-07 DIAGNOSIS — K449 Diaphragmatic hernia without obstruction or gangrene: Secondary | ICD-10-CM | POA: Diagnosis not present

## 2015-12-07 DIAGNOSIS — I4891 Unspecified atrial fibrillation: Secondary | ICD-10-CM

## 2015-12-07 DIAGNOSIS — E876 Hypokalemia: Secondary | ICD-10-CM | POA: Diagnosis not present

## 2015-12-07 DIAGNOSIS — G4733 Obstructive sleep apnea (adult) (pediatric): Secondary | ICD-10-CM | POA: Diagnosis not present

## 2015-12-07 DIAGNOSIS — Z833 Family history of diabetes mellitus: Secondary | ICD-10-CM | POA: Insufficient documentation

## 2015-12-07 DIAGNOSIS — M320 Drug-induced systemic lupus erythematosus: Secondary | ICD-10-CM | POA: Diagnosis not present

## 2015-12-07 DIAGNOSIS — I10 Essential (primary) hypertension: Secondary | ICD-10-CM | POA: Diagnosis present

## 2015-12-07 DIAGNOSIS — E559 Vitamin D deficiency, unspecified: Secondary | ICD-10-CM | POA: Insufficient documentation

## 2015-12-07 DIAGNOSIS — Z7901 Long term (current) use of anticoagulants: Secondary | ICD-10-CM | POA: Diagnosis not present

## 2015-12-07 DIAGNOSIS — Z87891 Personal history of nicotine dependence: Secondary | ICD-10-CM | POA: Insufficient documentation

## 2015-12-07 DIAGNOSIS — K5900 Constipation, unspecified: Secondary | ICD-10-CM | POA: Diagnosis not present

## 2015-12-07 DIAGNOSIS — K219 Gastro-esophageal reflux disease without esophagitis: Secondary | ICD-10-CM | POA: Diagnosis not present

## 2015-12-07 DIAGNOSIS — M479 Spondylosis, unspecified: Secondary | ICD-10-CM | POA: Insufficient documentation

## 2015-12-07 DIAGNOSIS — M8580 Other specified disorders of bone density and structure, unspecified site: Secondary | ICD-10-CM | POA: Diagnosis not present

## 2015-12-07 DIAGNOSIS — I480 Paroxysmal atrial fibrillation: Secondary | ICD-10-CM | POA: Diagnosis present

## 2015-12-07 DIAGNOSIS — I5032 Chronic diastolic (congestive) heart failure: Secondary | ICD-10-CM | POA: Diagnosis present

## 2015-12-07 DIAGNOSIS — Z9889 Other specified postprocedural states: Secondary | ICD-10-CM | POA: Insufficient documentation

## 2015-12-07 DIAGNOSIS — D72829 Elevated white blood cell count, unspecified: Secondary | ICD-10-CM | POA: Insufficient documentation

## 2015-12-07 DIAGNOSIS — Z888 Allergy status to other drugs, medicaments and biological substances status: Secondary | ICD-10-CM | POA: Insufficient documentation

## 2015-12-07 DIAGNOSIS — R748 Abnormal levels of other serum enzymes: Secondary | ICD-10-CM | POA: Insufficient documentation

## 2015-12-07 DIAGNOSIS — F419 Anxiety disorder, unspecified: Secondary | ICD-10-CM | POA: Diagnosis present

## 2015-12-07 DIAGNOSIS — Z886 Allergy status to analgesic agent status: Secondary | ICD-10-CM | POA: Insufficient documentation

## 2015-12-07 DIAGNOSIS — E785 Hyperlipidemia, unspecified: Secondary | ICD-10-CM | POA: Insufficient documentation

## 2015-12-07 DIAGNOSIS — Z8249 Family history of ischemic heart disease and other diseases of the circulatory system: Secondary | ICD-10-CM | POA: Insufficient documentation

## 2015-12-07 DIAGNOSIS — I110 Hypertensive heart disease with heart failure: Secondary | ICD-10-CM | POA: Diagnosis not present

## 2015-12-07 DIAGNOSIS — Z885 Allergy status to narcotic agent status: Secondary | ICD-10-CM | POA: Insufficient documentation

## 2015-12-07 DIAGNOSIS — G588 Other specified mononeuropathies: Secondary | ICD-10-CM | POA: Diagnosis not present

## 2015-12-07 DIAGNOSIS — I951 Orthostatic hypotension: Secondary | ICD-10-CM | POA: Insufficient documentation

## 2015-12-07 DIAGNOSIS — G709 Myoneural disorder, unspecified: Secondary | ICD-10-CM | POA: Insufficient documentation

## 2015-12-07 DIAGNOSIS — Z9049 Acquired absence of other specified parts of digestive tract: Secondary | ICD-10-CM | POA: Insufficient documentation

## 2015-12-07 DIAGNOSIS — Z823 Family history of stroke: Secondary | ICD-10-CM | POA: Insufficient documentation

## 2015-12-07 DIAGNOSIS — I700 Atherosclerosis of aorta: Secondary | ICD-10-CM | POA: Diagnosis not present

## 2015-12-07 DIAGNOSIS — Z882 Allergy status to sulfonamides status: Secondary | ICD-10-CM | POA: Insufficient documentation

## 2015-12-07 LAB — CBC
HCT: 37.2 % (ref 36.0–46.0)
HEMOGLOBIN: 12.3 g/dL (ref 12.0–15.0)
MCH: 29.4 pg (ref 26.0–34.0)
MCHC: 33.1 g/dL (ref 30.0–36.0)
MCV: 88.8 fL (ref 78.0–100.0)
PLATELETS: 414 10*3/uL — AB (ref 150–400)
RBC: 4.19 MIL/uL (ref 3.87–5.11)
RDW: 13.4 % (ref 11.5–15.5)
WBC: 10.6 10*3/uL — ABNORMAL HIGH (ref 4.0–10.5)

## 2015-12-07 NOTE — ED Triage Notes (Signed)
Per EMS pt diagnosed with a-fib 3 weeks ago. Pt takes metroprolol and eloquis for a-fib, pt felt fluttering started an hour ago and EMS states pt has HR of 150.

## 2015-12-07 NOTE — ED Provider Notes (Addendum)
[facility] DEPT Provider Note   CSN: [id number] Arrival date & time: 12/07/15  2331 By signing my name below, I, Georgette Shell, attest that this documentation has been prepared under the direction and in the presence of Ezequiel Essex, MD. Electronically Signed: Georgette Shell, ED Scribe. 12/07/15. 11:59 PM.  History   Chief Complaint Chief Complaint  Patient presents with  . Atrial Fibrillation   HPI Comments: Shannon [name] is a 65 y.o. female with h/o HTN and A-fib on Eliquis and Metroprolol who presents to the Emergency Department complaining of sensation of heart palpations onset ~10 pm tonight with associated shortness of breath and urinary frequency. Pt reports normal bowel movement and normal appetite. Pt was just diagnosed with A-fib three weeks ago. She relates her symptoms at this time to her A-fib; pt also notes she urinates more frequently with A-fib exacerbations. Pt is on oxygen at home. Denies any pain at this time. Pt further denies leg swelling, light-headedness, a dizziness, fever, or any other associated symptoms.   The history is provided by the patient. No language interpreter was used.    Past Medical History:  Diagnosis Date  . Anxiety   . Arthritis    neck and back  . Degenerative disorder of bone   . Depression   . Diastolic dysfunction   . GERD (gastroesophageal reflux disease)   . Herniated disc, cervical   . Hyperlipidemia   . Hypertension   . Lupus    HCTZ induced  . 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 Obstructive sleep apnea   . Orthostatic hypotension   . Osteopenia   . PAF (paroxysmal atrial fibrillation) ([place])   . Pinched nerve in neck   . T12 compression fracture ([place]) 11/2015  . Vitamin D deficiency   . Whiplash injury 06/07/2010    Patient Active Problem List   Diagnosis Date Noted  . Elevated troponin I level 11/24/2015  . Atrial fibrillation with RVR ([place])   . Fall  11/16/2015  . Thoracic compression fracture ([place]) 11/16/2015  . Depression 11/16/2015  . Orthostatic hypotension 11/15/2015  . Osteopenia 09/12/2015  . Postmenopausal estrogen deficiency 08/29/2015  . Vitamin D deficiency 08/29/2015  . OSA (obstructive sleep apnea) 03/04/2015  . Polymyalgia ([place]) 11/22/2014  . Other fatigue 10/28/2014  . Benign paroxysmal positional vertigo 08/21/2014  . Essential hypertension 08/21/2014  . Anxiety and depression 08/21/2014  . Hip stiffness 08/21/2014    Past Surgical History:  Procedure Laterality Date  . APPENDECTOMY    . arm surgery Left     OB History    No data available       Home Medications    Prior to Admission medications   Medication Sig Start Date End Date Taking? Authorizing Provider  acetaminophen (TYLENOL) 325 MG tablet Take 325-650 mg by mouth every 6 (six) hours as needed for mild pain.    Historical Provider, MD  albuterol (PROVENTIL HFA;VENTOLIN HFA) 108 (90 BASE) MCG/ACT inhaler Inhale 1 puff into the lungs every 4 (four) hours as needed for wheezing or shortness of breath.    Historical Provider, MD  ALPRAZolam Duanne Moron) 0.5 MG tablet Take 0.5 mg by mouth 3 (three) times daily as needed for anxiety.    Historical Provider, MD  apixaban (ELIQUIS) 5 MG TABS tablet Take 1 tablet (5 mg total) by mouth 2 (two) times daily. 11/19/15   Robbie Lis, MD  Cholecalciferol (VITAMIN D-3 PO) Take  1 capsule by mouth at bedtime.    Historical Provider, MD  diltiazem (CARDIZEM) 120 MG tablet Take 120 mg by mouth 4 (four) times daily.    Historical Provider, MD  DULoxetine (CYMBALTA) 30 MG capsule Take 1 capsule (30 mg total) by mouth daily. 09/24/15   Brunetta Jeans, PA-C  ergocalciferol (VITAMIN D2) 50000 units capsule Take 1 capsule (50,000 Units total) by mouth every 30 (thirty) days. 08/29/15   Brunetta Jeans, PA-C  HYDROcodone-acetaminophen (NORCO/VICODIN) 5-325 MG tablet Take 1 tablet by mouth every 8 (eight) hours as needed for  severe pain. 11/27/15   Brunetta Jeans, PA-C  metoprolol (LOPRESSOR) 50 MG tablet Take 1 tablet (50 mg total) by mouth 2 (two) times daily. 11/19/15   Robbie Lis, MD    Family History Family History  Problem Relation Age of Onset  . Cancer Mother   . Stroke Father   . Hypertension Father   . Diabetes Neg Hx     Social History Social History  Substance Use Topics  . Smoking status: Former Smoker    Types: Cigarettes    Quit date: 2015  . Smokeless tobacco: Former Systems developer     Comment: Pt started to use vaporizer-uses rarely  . Alcohol use 0.6 oz/week    1 Glasses of wine per week     Comment: social or occationally     Allergies   Ace inhibitors; Ace inhibitors; Hctz [hydrochlorothiazide]; Other; Oxycodone; Oxycodone; Sulfa antibiotics; Sulfur; and Voltaren [diclofenac sodium]   Review of Systems Review of Systems 10 Systems reviewed and all are negative for acute change except as noted in the HPI. Physical Exam Updated Vital Signs BP 181/94 (BP Location: Left Arm)   Pulse 102   Temp 98.2 F (36.8 C) (Oral)   Resp 17   Ht 5\' 7"  (1.702 m)   Wt 223 lb (101.2 kg)   SpO2 96%   BMI 34.93 kg/m   Physical Exam  Constitutional: She is oriented to person, place, and time. She appears well-developed and well-nourished. No distress.  HENT:  Head: Normocephalic and atraumatic.  Mouth/Throat: Oropharynx is clear and moist. No oropharyngeal exudate.  Eyes: Conjunctivae and EOM are normal. Pupils are equal, round, and reactive to light.  Neck: Normal range of motion. Neck supple.  No meningismus.  Cardiovascular: Normal heart sounds and intact distal pulses.  An irregularly irregular rhythm present. Tachycardia present.   No murmur heard. Irregularly irregular tachycardia. Rate varies from 100-130s.  Pulmonary/Chest: Effort normal and breath sounds normal. No respiratory distress. She has no wheezes. She has no rales.  Abdominal: Soft. There is no tenderness. There is no  rebound and no guarding.  Musculoskeletal: Normal range of motion. She exhibits no edema or tenderness.  Neurological: She is alert and oriented to person, place, and time. No cranial nerve deficit. She exhibits normal muscle tone. Coordination normal.   5/5 strength throughout. CN 2-12 intact.Equal grip strength.   Skin: Skin is warm.  Psychiatric: She has a normal mood and affect. Her behavior is normal.  Nursing note and vitals reviewed.    ED Treatments / Results  DIAGNOSTIC STUDIES: Oxygen Saturation is 96% on RA, adequate by my interpretation.    COORDINATION OF CARE: 11:56 PM Discussed treatment plan with pt at bedside and pt agreed to plan.  Labs (all  labs ordered are listed, but only abnormal results are displayed) Labs Reviewed  BASIC METABOLIC PANEL - Abnormal; Notable for the following:  Result Value   Potassium 3.3 (*)    Glucose, Bld 129 (*)    All other components within normal limits  CBC - Abnormal; Notable for the following:    WBC 10.6 (*)    Platelets 414 (*)    All other components within normal limits  LIPID PANEL - Abnormal; Notable for the following:    LDL Cholesterol 118 (*)    All other components within normal limits  TROPONIN I - Abnormal; Notable for the following:    Troponin I 0.16 (*)    All other components within normal limits  BRAIN NATRIURETIC PEPTIDE - Abnormal; Notable for the following:    B Natriuretic Peptide 341.2 (*)    All other components within normal limits  PROTIME-INR  TSH  HEMOGLOBIN A1C  TROPONIN I  TROPONIN I  BASIC METABOLIC PANEL  CBC  URINALYSIS, ROUTINE W REFLEX MICROSCOPIC (NOT AT [facility])  I-STAT TROPOININ, ED    EKG  EKG Interpretation None       Radiology No results found.  Procedures .Cardioversion Date/Time: 12/08/2015 1:47 AM Performed by: Ezequiel Essex Authorized by: Ezequiel Essex   Consent:    Consent obtained:  Written   Consent given by:  Patient   Risks discussed:   Cutaneous burn, death, induced arrhythmia and pain   Alternatives discussed:  Rate-control medication and alternative treatment Pre-procedure details:    Cardioversion basis:  Emergent   Rhythm:  Atrial fibrillation   Electrode placement:  Anterior-posterior Attempt one:    Cardioversion mode:  Synchronous   Waveform:  Biphasic   Shock (Joules):  100   Shock outcome:  No change in rhythm Attempt two:    Cardioversion mode:  Synchronous   Waveform:  Biphasic   Shock (Joules):  150   Shock outcome:  No change in rhythm Attempt three:    Cardioversion mode:  Synchronous   Waveform:  Biphasic   Shock (Joules):  200   Shock outcome:  No change in rhythm Post-procedure details:    Patient status:  Awake   Patient tolerance of procedure:  Tolerated well, no immediate complications Comments:     Brief episode of sinus but back to Afib   (including critical care time)  Medications Ordered in ED Medications - No data to display   Initial Impression / Assessment and Plan / ED Course  I have reviewed the triage vital signs and the nursing notes.  Pertinent labs & imaging results that were available during my care of the patient were reviewed by me and considered in my medical decision making (see chart for details).  Clinical Course     Final Clinical Impressions(s) / ED Diagnoses   Final diagnoses:  Atrial fibrillation with RVR ([place])  Patient presents with palpitations that onset around 10 PM while at rest. No chest pain or shortness of breath. History of atrial fibrillation of Eliquis and metoprolol, states compliance.  EKG with rapid Afib and nonspecific ST depressions.  Patient admits to no missed doses of Eliquis in the past 3 weeks. Discussed with cardiology Dr. Karlyn Agee who agrees with cardioversion given patient's palpitations and shortness of breath.  Cardioversion attempted as above with brief return to sinus but will be back to A. Fib. Labs mild hypokalemia and minimal  troponin elevation  Discussed with cardiology Dr> Karlyn Agee who agrees with Cardizem loading dose and medicine admission. Admission d/w Dr. Blaine Hamper.   This patients CHA2DS2-VASc Score and unadjusted Ischemic Stroke Rate (% per year) is equal to 2  Above score calculated as 1 point each if present [CHF, HTN, DM, Vascular=MI/PAD/Aortic Plaque, Age if 65-74, or Female] Above score calculated as 2 points each if present [Age > 75, or Stroke/TIA/TE]       Procedural sedation Performed by: Ezequiel Essex Consent: Verbal consent obtained. Risks and benefits: risks, benefits and alternatives were discussed Required items: required blood products, implants, devices, and special equipment available Patient identity confirmed: arm band and provided demographic data Time out: Immediately prior to procedure a "time out" was called to verify the correct patient, procedure, equipment, support staff and site/side marked as required.  Sedation type: moderate (conscious) sedation NPO time confirmed and considedered  Sedatives: ETOMIDATE  Physician Time at Bedside: 15  Vitals: Vital signs were monitored during sedation. Cardiac Monitor, pulse oximeter Patient tolerance: Patient tolerated the procedure well with no immediate complications. Comments: Pt with uneventful recovered. Returned to pre-procedural sedation baseline  CRITICAL CARE Performed by: Ezequiel Essex Total critical care time: 35 minutes Critical care time was exclusive of separately billable procedures and treating other patients. Critical care was necessary to treat or prevent imminent or life-threatening deterioration. Critical care was time spent personally by me on the following activities: development of treatment plan with patient and/or surrogate as well as nursing, discussions with consultants, evaluation of patient's response to treatment, examination of patient, obtaining history from patient or surrogate, ordering and performing  treatments and interventions, ordering and review of laboratory studies, ordering and review of radiographic studies, pulse oximetry and re-evaluation of patient's condition.   New Prescriptions New Prescriptions   No medications on file  I personally performed the services described in this documentation, which was scribed in my presence. The recorded information has been reviewed and is accurate.     Ezequiel Essex, MD 12/08/15 [id number]    Ezequiel Essex, MD 12/08/15 [phone]

## 2015-12-07 NOTE — ED Notes (Signed)
Pt states she laid down for be at approximately 2200 hrs when she felt her heart start to race. Pt's family called 9-1-1.

## 2015-12-08 ENCOUNTER — Emergency Department (HOSPITAL_COMMUNITY): Payer: No Typology Code available for payment source

## 2015-12-08 DIAGNOSIS — F418 Other specified anxiety disorders: Secondary | ICD-10-CM | POA: Diagnosis not present

## 2015-12-08 DIAGNOSIS — I10 Essential (primary) hypertension: Secondary | ICD-10-CM | POA: Diagnosis not present

## 2015-12-08 DIAGNOSIS — I4891 Unspecified atrial fibrillation: Secondary | ICD-10-CM | POA: Diagnosis not present

## 2015-12-08 DIAGNOSIS — E876 Hypokalemia: Secondary | ICD-10-CM

## 2015-12-08 DIAGNOSIS — I5032 Chronic diastolic (congestive) heart failure: Secondary | ICD-10-CM | POA: Diagnosis present

## 2015-12-08 LAB — BASIC METABOLIC PANEL
ANION GAP: 11 (ref 5–15)
Anion gap: 11 (ref 5–15)
BUN: 6 mg/dL (ref 6–20)
BUN: 9 mg/dL (ref 6–20)
CALCIUM: 9.4 mg/dL (ref 8.9–10.3)
CALCIUM: 9.5 mg/dL (ref 8.9–10.3)
CHLORIDE: 102 mmol/L (ref 101–111)
CO2: 24 mmol/L (ref 22–32)
CO2: 25 mmol/L (ref 22–32)
CREATININE: 0.7 mg/dL (ref 0.44–1.00)
CREATININE: 0.75 mg/dL (ref 0.44–1.00)
Chloride: 103 mmol/L (ref 101–111)
GFR calc non Af Amer: >60 mL/min (ref >60)
GLUCOSE: 116 mg/dL — AB (ref 65–99)
GLUCOSE: 129 mg/dL — AB (ref 65–99)
Potassium: 3.3 mmol/L — ABNORMAL LOW (ref 3.5–5.1)
Potassium: 3.4 mmol/L — ABNORMAL LOW (ref 3.5–5.1)
Sodium: 138 mmol/L (ref 135–145)
Sodium: 138 mmol/L (ref 135–145)

## 2015-12-08 LAB — LIPID PANEL
CHOL/HDL RATIO: 3.9 ratio
CHOLESTEROL: 189 mg/dL (ref 0–200)
HDL: 48 mg/dL (ref >40)
LDL CALC: 118 mg/dL — AB (ref 0–99)
TRIGLYCERIDES: 116 mg/dL (ref <150)
VLDL: 23 mg/dL (ref 0–40)

## 2015-12-08 LAB — TROPONIN I
TROPONIN I: 0.06 ng/mL — AB (ref <0.03)
Troponin I: 0.16 ng/mL (ref <0.03)
Troponin I: 0.06 ng/mL (ref <0.03)

## 2015-12-08 LAB — BRAIN NATRIURETIC PEPTIDE: B Natriuretic Peptide: 341.2 pg/mL — ABNORMAL HIGH (ref 0.0–100.0)

## 2015-12-08 LAB — TSH: TSH: 1.002 u[IU]/mL (ref 0.350–4.500)

## 2015-12-08 LAB — I-STAT TROPONIN, ED: Troponin i, poc: 0.03 ng/mL (ref 0.00–0.08)

## 2015-12-08 LAB — MAGNESIUM: MAGNESIUM: 2.1 mg/dL (ref 1.7–2.4)

## 2015-12-08 LAB — PROTIME-INR
INR: 1.18
Prothrombin Time: 15.1 seconds (ref 11.4–15.2)

## 2015-12-08 IMAGING — DX DG CHEST 2V
2 series · 2 of 2 positions shown · non-contrast
Comparison: None.

CLINICAL DATA: Acute onset of atrial fibrillation and shortness of
breath. Initial encounter.

EXAM:
CHEST  2 VIEW

[chest pa]
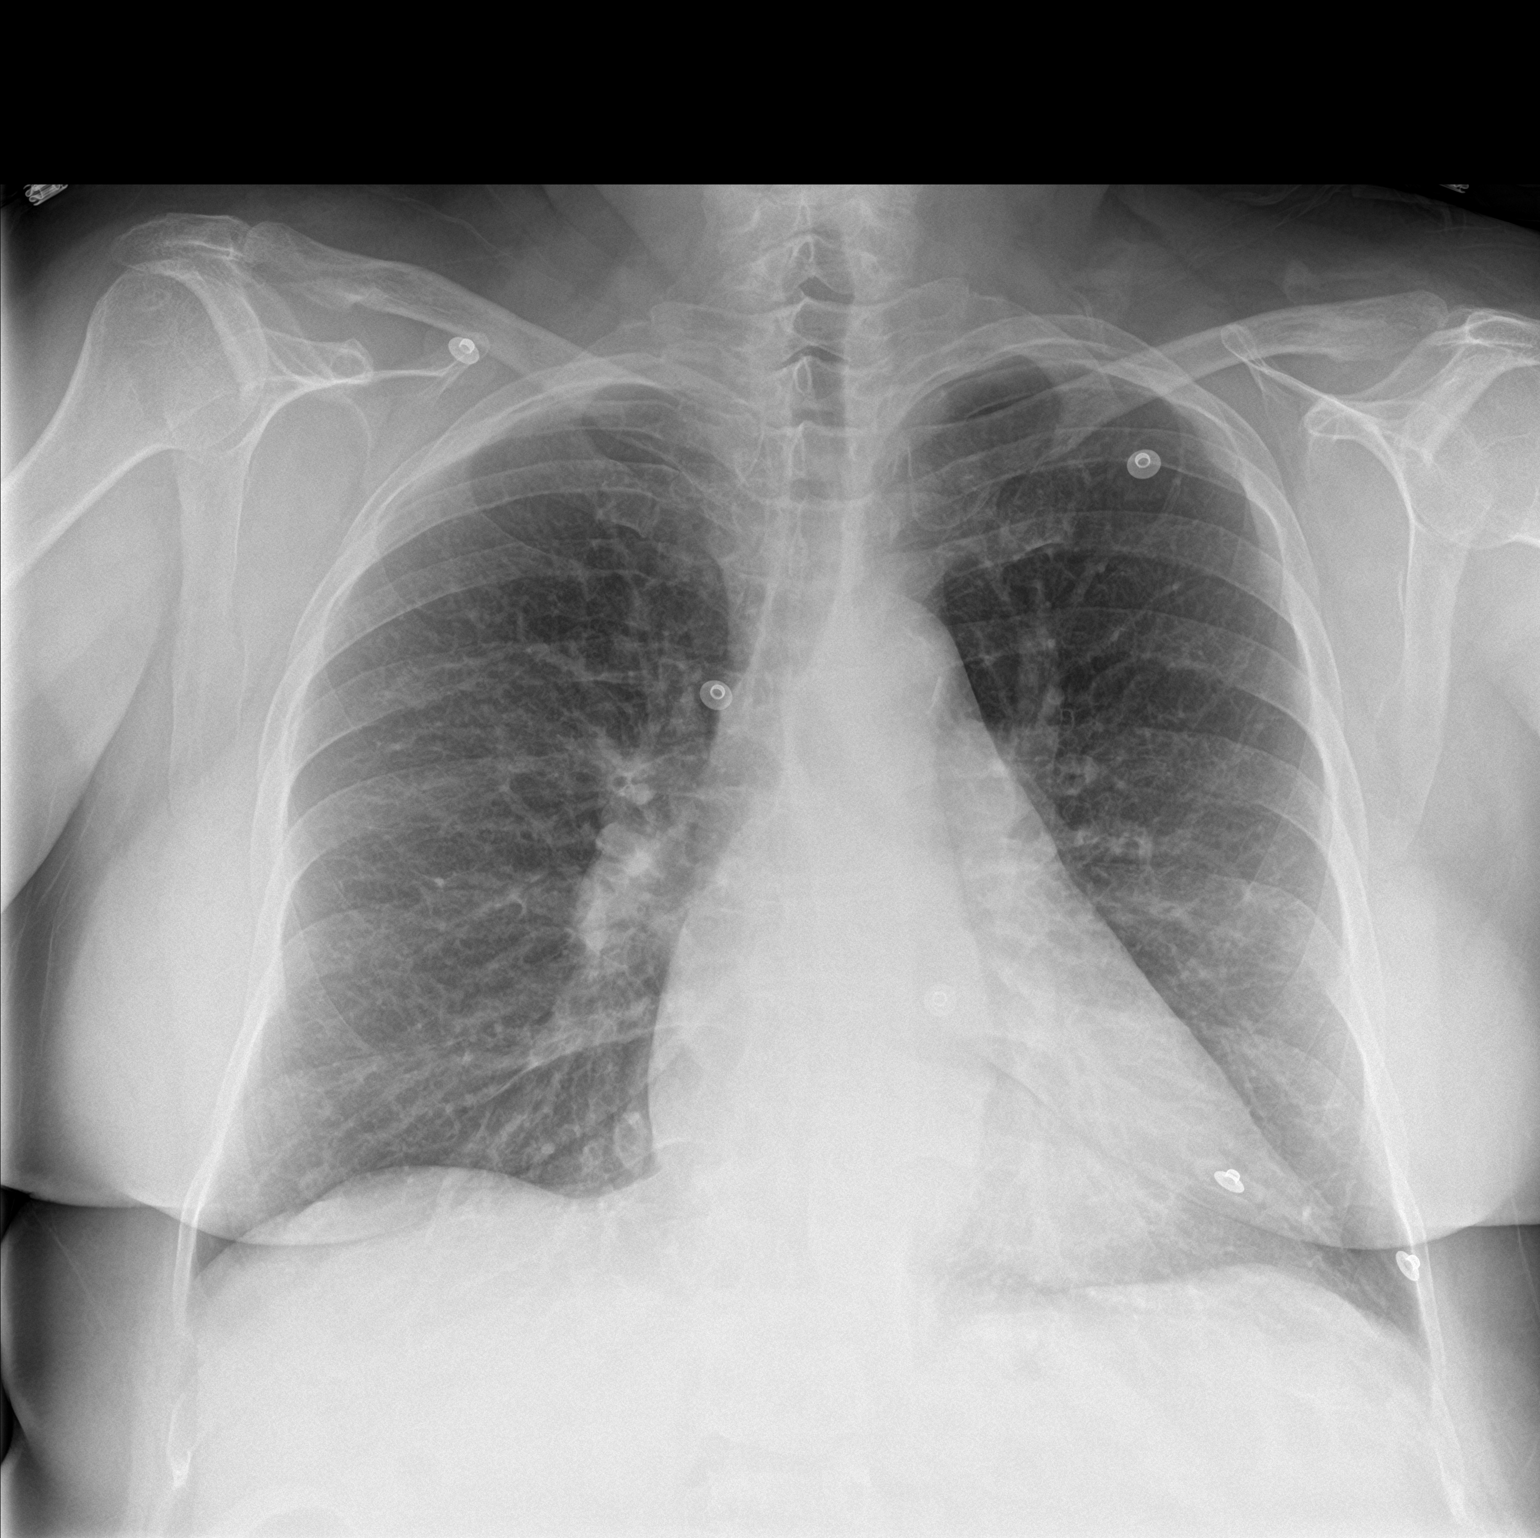

[chest lat]
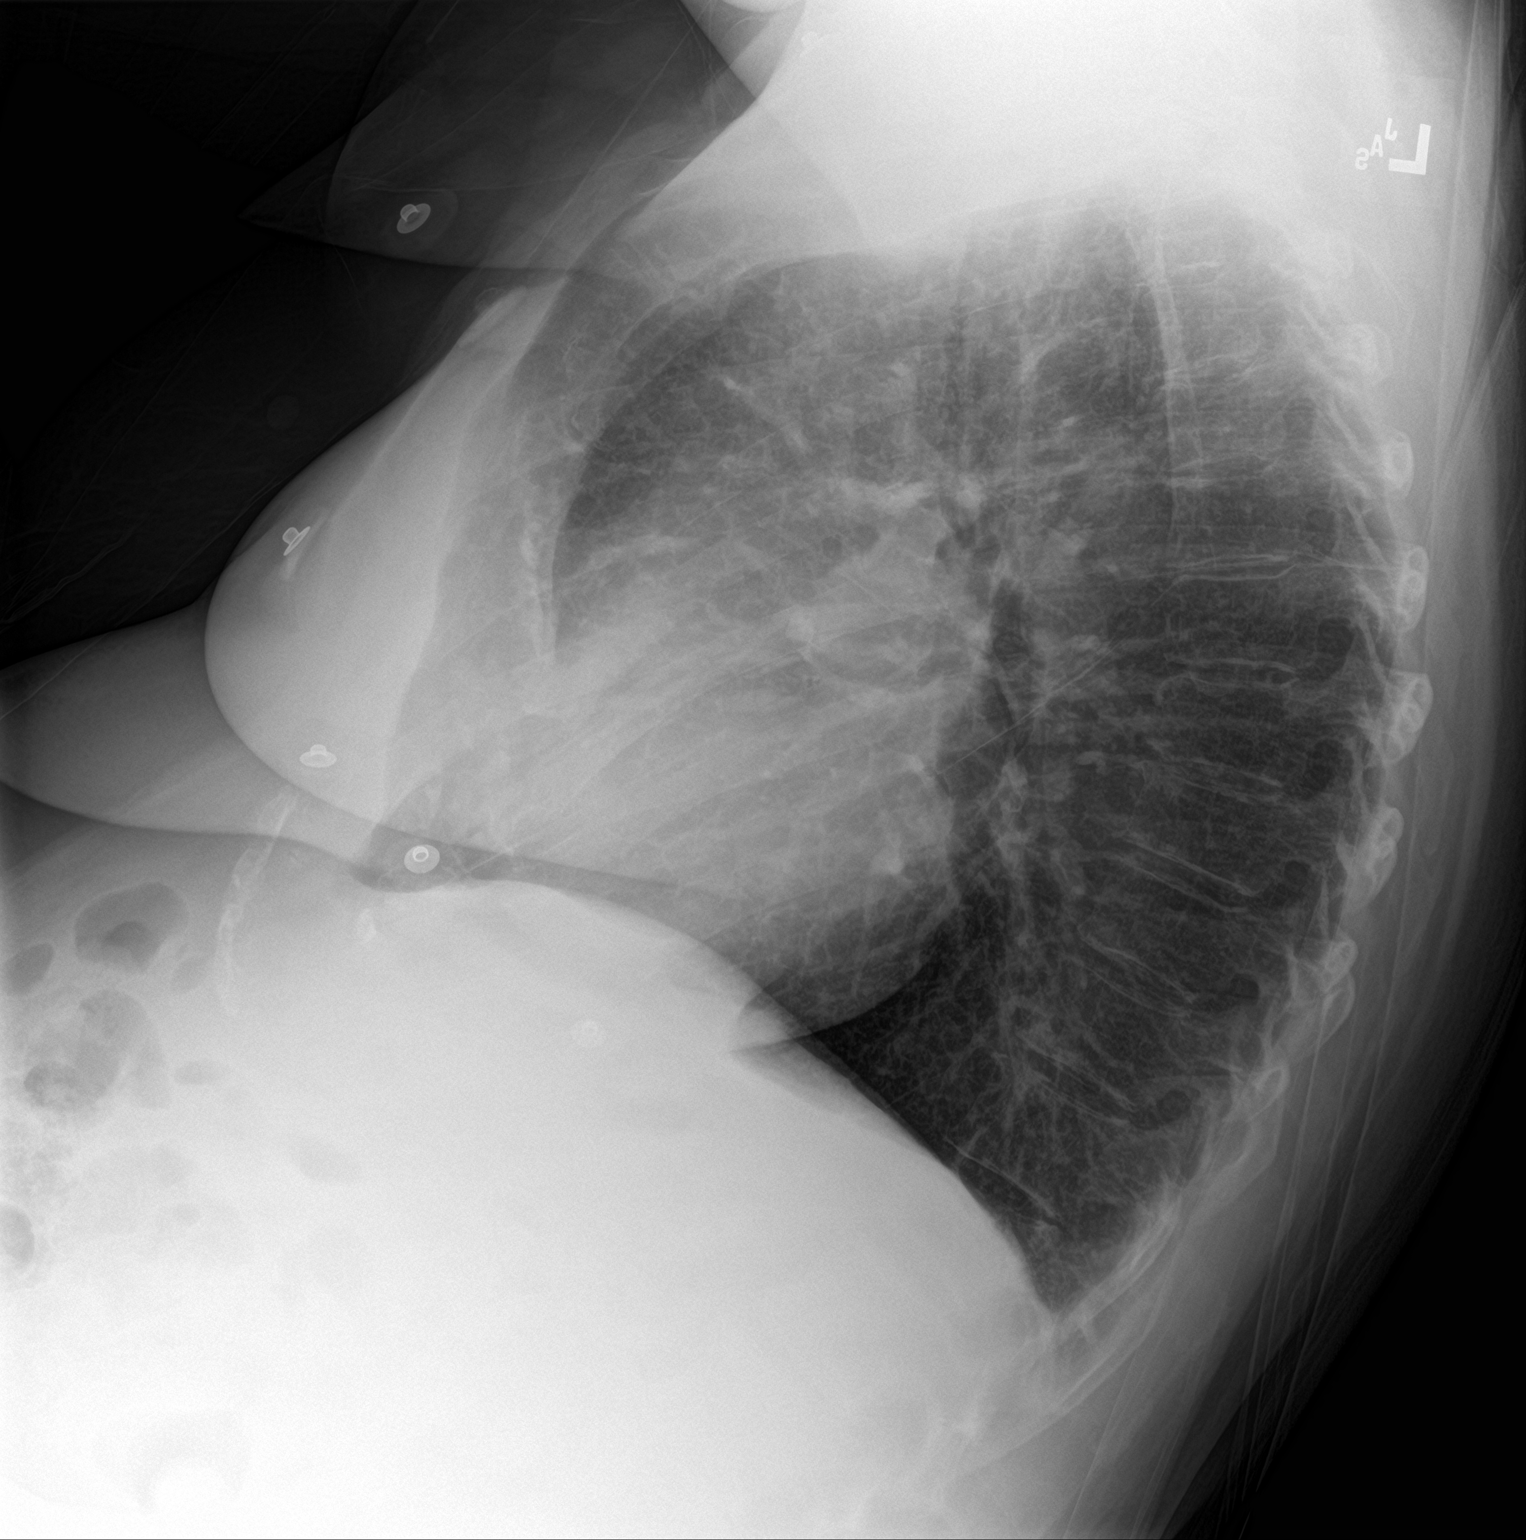

[2 of 2 positions shown; findings below may reference images not displayed]

FINDINGS: The lungs are well-aerated. Vascular congestion is noted. Mild
peribronchial thickening is noted. There is no evidence of focal
opacification, pleural effusion or pneumothorax.

The heart is normal in size; the mediastinal contour is within
normal limits. No acute osseous abnormalities are seen.
IMPRESSION: Vascular congestion noted.  Mild peribronchial thickening noted.

## 2015-12-08 MED ORDER — POTASSIUM CHLORIDE CRYS ER 20 MEQ PO TBCR
40.0000 meq | EXTENDED_RELEASE_TABLET | Freq: Once | ORAL | Status: AC
  Administered 2015-12-08: 40 meq via ORAL
  Filled 2015-12-08: qty 2

## 2015-12-08 MED ORDER — SENNOSIDES-DOCUSATE SODIUM 8.6-50 MG PO TABS
1.0000 | ORAL_TABLET | Freq: Every evening | ORAL | Status: DC | PRN
  Filled 2015-12-08: qty 1

## 2015-12-08 MED ORDER — ZOLPIDEM TARTRATE 5 MG PO TABS: 5.0000 mg | ORAL_TABLET | Freq: Every evening | ORAL | Status: DC | PRN

## 2015-12-08 MED ORDER — DILTIAZEM LOAD VIA INFUSION
10.0000 mg | Freq: Once | INTRAVENOUS | Status: AC
  Administered 2015-12-08: 10 mg via INTRAVENOUS
  Filled 2015-12-08: qty 10

## 2015-12-08 MED ORDER — ALPRAZOLAM 0.25 MG PO TABS: 0.5000 mg | ORAL_TABLET | Freq: Three times a day (TID) | ORAL | Status: DC | PRN

## 2015-12-08 MED ORDER — ETOMIDATE 2 MG/ML IV SOLN
INTRAVENOUS | Status: AC | PRN
  Administered 2015-12-08: 10 mg via INTRAVENOUS

## 2015-12-08 MED ORDER — DILTIAZEM HCL 30 MG PO TABS
30.0000 mg | ORAL_TABLET | Freq: Four times a day (QID) | ORAL | Status: DC
  Administered 2015-12-08: 30 mg via ORAL
  Filled 2015-12-08 (×2): qty 1

## 2015-12-08 MED ORDER — FUROSEMIDE 10 MG/ML IJ SOLN: 20.0000 mg | Freq: Three times a day (TID) | INTRAMUSCULAR | Status: DC

## 2015-12-08 MED ORDER — APIXABAN 5 MG PO TABS
5.0000 mg | ORAL_TABLET | Freq: Two times a day (BID) | ORAL | Status: DC
  Administered 2015-12-08: 5 mg via ORAL
  Filled 2015-12-08 (×3): qty 1

## 2015-12-08 MED ORDER — HYDROCODONE-ACETAMINOPHEN 5-325 MG PO TABS
1.0000 | ORAL_TABLET | ORAL | Status: DC | PRN
  Administered 2015-12-08: 1 via ORAL

## 2015-12-08 MED ORDER — ETOMIDATE 2 MG/ML IV SOLN
5.0000 mg | Freq: Once | INTRAVENOUS | Status: AC
  Administered 2015-12-08: 5 mg via INTRAVENOUS

## 2015-12-08 MED ORDER — DULOXETINE HCL 30 MG PO CPEP
30.0000 mg | ORAL_CAPSULE | Freq: Every day | ORAL | Status: DC
  Administered 2015-12-08: 30 mg via ORAL
  Filled 2015-12-08: qty 1

## 2015-12-08 MED ORDER — HYDROCODONE-ACETAMINOPHEN 5-325 MG PO TABS
1.0000 | ORAL_TABLET | Freq: Three times a day (TID) | ORAL | Status: DC | PRN
  Filled 2015-12-08: qty 1

## 2015-12-08 MED ORDER — ONDANSETRON HCL 4 MG/2ML IJ SOLN: 4.0000 mg | Freq: Four times a day (QID) | INTRAMUSCULAR | Status: DC | PRN

## 2015-12-08 MED ORDER — POLYETHYLENE GLYCOL 3350 17 G PO PACK: 17.0000 g | PACK | Freq: Every day | ORAL | Status: DC | PRN

## 2015-12-08 MED ORDER — DILTIAZEM HCL 100 MG IV SOLR
INTRAVENOUS | Status: AC
  Filled 2015-12-08: qty 100

## 2015-12-08 MED ORDER — FUROSEMIDE 10 MG/ML IJ SOLN
20.0000 mg | Freq: Every day | INTRAMUSCULAR | Status: DC
  Administered 2015-12-08: 20 mg via INTRAVENOUS
  Filled 2015-12-08: qty 2

## 2015-12-08 MED ORDER — ALBUTEROL SULFATE (2.5 MG/3ML) 0.083% IN NEBU: 2.5000 mg | INHALATION_SOLUTION | RESPIRATORY_TRACT | Status: DC | PRN

## 2015-12-08 MED ORDER — FLECAINIDE ACETATE 100 MG PO TABS: 100.0000 mg | ORAL_TABLET | Freq: Two times a day (BID) | ORAL | 0 refills | Status: DC

## 2015-12-08 MED ORDER — POTASSIUM CHLORIDE 20 MEQ/15ML (10%) PO SOLN: 20.0000 meq | Freq: Once | ORAL | Status: DC

## 2015-12-08 MED ORDER — FLECAINIDE ACETATE 100 MG PO TABS
300.0000 mg | ORAL_TABLET | Freq: Once | ORAL | Status: AC
  Administered 2015-12-08: 300 mg via ORAL
  Filled 2015-12-08: qty 3

## 2015-12-08 MED ORDER — METOPROLOL TARTRATE 25 MG PO TABS
50.0000 mg | ORAL_TABLET | Freq: Two times a day (BID) | ORAL | Status: DC
  Administered 2015-12-08: 50 mg via ORAL
  Filled 2015-12-08: qty 2

## 2015-12-08 MED ORDER — VITAMIN D 1000 UNITS PO TABS: 1000.0000 [IU] | ORAL_TABLET | Freq: Every day | ORAL | Status: DC

## 2015-12-08 MED ORDER — DILTIAZEM HCL 100 MG IV SOLR
5.0000 mg/h | INTRAVENOUS | Status: DC
  Administered 2015-12-08: 5 mg/h via INTRAVENOUS

## 2015-12-08 NOTE — Sedation Documentation (Signed)
Shocked at 100 J.

## 2015-12-08 NOTE — Sedation Documentation (Signed)
Pt shocked at 200J

## 2015-12-08 NOTE — Sedation Documentation (Signed)
Pt shocked at 150J.

## 2015-12-08 NOTE — Progress Notes (Signed)
Per Dr. Lovena Le, patient can be discharge home on Flecainide 100 mg BID, and metoprolol. No diltiazem. She will need to follow up in our office to see Dr. Lovena Le for EKG and ongoing evaluation in 2 weeks.

## 2015-12-08 NOTE — ED Notes (Signed)
Notified admitting provider of critical lab

## 2015-12-08 NOTE — Discharge Instructions (Signed)
Atrial Fibrillation °Introduction °Atrial fibrillation is a type of heartbeat that is irregular or fast (rapid). If you have this condition, your heart keeps quivering in a weird (chaotic) way. This condition can make it so your heart cannot pump blood normally. Having this condition gives a person more risk for stroke, heart failure, and other heart problems. There are different types of atrial fibrillation. Talk with your doctor to learn about the type that you have. °Follow these instructions at home: °· Take over-the-counter and prescription medicines only as told by your doctor. °· If your doctor prescribed a blood-thinning medicine, take it exactly as told. Taking too much of it can cause bleeding. If you do not take enough of it, you will not have the protection that you need against stroke and other problems. °· Do not use any tobacco products. These include cigarettes, chewing tobacco, and e-cigarettes. If you need help quitting, ask your doctor. °· If you have apnea (obstructive sleep apnea), manage it as told by your doctor. °· Do not drink alcohol. °· Do not drink beverages that have caffeine. These include coffee, soda, and tea. °· Maintain a healthy weight. Do not use diet pills unless your doctor says they are safe for you. Diet pills may make heart problems worse. °· Follow diet instructions as told by your doctor. °· Exercise regularly as told by your doctor. °· Keep all follow-up visits as told by your doctor. This is important. °Contact a doctor if: °· You notice a change in the speed, rhythm, or strength of your heartbeat. °· You are taking a blood-thinning medicine and you notice more bruising. °· You get tired more easily when you move or exercise. °Get help right away if: °· You have pain in your chest or your belly (abdomen). °· You have sweating or weakness. °· You feel sick to your stomach (nauseous). °· You notice blood in your throw up (vomit), poop (stool), or pee (urine). °· You are  short of breath. °· You suddenly have swollen feet and ankles. °· You feel dizzy. °· Your suddenly get weak or numb in your face, arms, or legs, especially if it happens on one side of your body. °· You have trouble talking, trouble understanding, or both. °· Your face or your eyelid droops on one side. °These symptoms may be an emergency. Do not wait to see if the symptoms will go away. Get medical help right away. Call your local emergency services (911 in the [place] Do not drive yourself to the hospital.  °This information is not intended to replace advice given to you by your health care provider. Make sure you discuss any questions you have with your health care provider. °Document Released: 10/02/2007 Document Revised: 05/31/2015 Document Reviewed: 04/19/2014 °© 2017 Elsevier ° °

## 2015-12-08 NOTE — Consult Note (Signed)
Chief Complaint: f/u afib  History of Present Illness:  Shannon [name] is a 65 y.o. 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  To recap hx, nuclear stress test done for chest pain in 9/17 was low risk with EF 48% and no ischemia. Subsequent echo was done 10/2015 demonstrating EF 55-60%, grade 2 DD. She was admitted 11/9-11/13/17 with fall and profound orthostatic hypotension felt due to antihypertensives, which were adjusted. She sustained a T12 compresison fx. She had a negative cosyntropine test. She had a brief episode of afib with RVR and converted spontaneously on her own and stated that she had had similar episodes in the past and thought it was anxiety. She was started on Eliquis. She was readmitted 11/18-11/19/17 with palpitations and recurrent atrial fib RVR. She was placed on IV cardizem with plans for TEE/DCCV but again spontaneously converted to NSR. She was subsequently transitioned to oral cardizem and discharged home. Troponin was 0.05, felt due to demand ischemia. Otherwise labs notable for K 3.8, Cr 0.91, glucose 105, Mg 2.3, Hgb 12.3. Thyroid function was normal in 05/2015. Repeat echo 11/18/15: mod LVH, EF 0000000, normal diastolic parameters, mild-mod LAE, mildly dilated RA. CHADSVASC is at least 2 for female and HTN (1 point was added in hospital for possible diastolic CHF although most recent echo showed normal diastolic parameters).  She returns for follow-up today with her husband. She is doing well. She has not had any recurrent palpitations on current regimen. She denies chest pain or dyspnea. She has not been using her CPAP recently because it's been difficult with her back injury. She was compliant prior to the fracture though. She is happy to report she was actually discharged from PT after 2 visits because  she was doing so well. She has not had any recurrent symptoms of orthostasis. No recurrent falls. She is still using a walker to be safe. It was recommended to stop her losartan to be able to maximize rate controlling meds but this was still on her discharge list - she denies taking it. No bleeding issues. PCP rechecked CBC/BMET yesterday, labs pending at this time.       Past Medical History:  Diagnosis Date  . Anxiety   . Arthritis    neck and back  . Degenerative disorder of bone   . Depression   . Diastolic dysfunction   . GERD (gastroesophageal reflux disease)   . Herniated disc, cervical   . Hyperlipidemia   . Hypertension   . Lupus    HCTZ induced  . 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 Obstructive sleep apnea   . Orthostatic hypotension   . Osteopenia   . PAF (paroxysmal atrial fibrillation) ([place])   . Pinched nerve in neck   . T12 compression fracture ([place]) 11/2015  . Vitamin D deficiency   . Whiplash injury 06/07/2010         Past Surgical History:  Procedure Laterality Date  . APPENDECTOMY    . arm surgery Left     Current Medications:       Current Outpatient Prescriptions  Medication Sig Dispense Refill  . acetaminophen (TYLENOL) 325 MG tablet Take 325-650 mg by mouth every 6 (six) hours as needed for mild pain.    Marland Kitchen albuterol (PROVENTIL HFA;VENTOLIN HFA) 108 (90 BASE)  MCG/ACT inhaler Inhale 1 puff into the lungs every 4 (four) hours as needed for wheezing or shortness of breath.    . ALPRAZolam (XANAX) 0.5 MG tablet Take 0.5 mg by mouth 3 (three) times daily as needed for anxiety.    Marland Kitchen apixaban (ELIQUIS) 5 MG TABS tablet Take 1 tablet (5 mg total) by mouth 2 (two) times daily. 60 tablet 0  . Cholecalciferol (VITAMIN D-3 PO) Take 1 capsule by mouth at bedtime.    Marland Kitchen diltiazem (CARDIZEM) 120 MG tablet Take 120 mg by mouth 4 (four) times daily.    . DULoxetine (CYMBALTA)  30 MG capsule Take 1 capsule (30 mg total) by mouth daily. 30 capsule 5  . ergocalciferol (VITAMIN D2) 50000 units capsule Take 1 capsule (50,000 Units total) by mouth every 30 (thirty) days. 4 capsule 3  . HYDROcodone-acetaminophen (NORCO/VICODIN) 5-325 MG tablet Take 1 tablet by mouth every 8 (eight) hours as needed for severe pain. 30 tablet 0  . metoprolol (LOPRESSOR) 50 MG tablet Take 1 tablet (50 mg total) by mouth 2 (two) times daily. 60 tablet 0   No current facility-administered medications for this visit.      Allergies:   Ace inhibitors; Ace inhibitors; Hctz [hydrochlorothiazide]; Other; Oxycodone; Oxycodone; Sulfa antibiotics; Sulfur; and Voltaren [diclofenac sodium]   Social History        Social History  . Marital status: Married    Spouse name: N/A  . Number of children: N/A  . Years of education: N/A         Social History Main Topics  . Smoking status: Former Smoker    Types: Cigarettes    Quit date: 2015  . Smokeless tobacco: Former Systems developer     Comment: Pt started to use vaporizer-uses rarely  . Alcohol use 0.6 oz/week    1 Glasses of wine per week     Comment: social or occationally  . Drug use: No  . Sexual activity: Yes       Other Topics Concern  . None      Social History Narrative   ** Merged History Encounter **         Family History:  The patient's family history includes Cancer in her mother; Hypertension in her father; Stroke in her father.   ROS:   Please see the history of present illness. All other systems are reviewed and otherwise negative.    PHYSICAL EXAM:   VS:  BP 140/60   Pulse 67   Ht 5\' 7"  (1.702 m)   Wt 223 lb 6 oz (101.3 kg)   SpO2 95%   BMI 34.99 kg/m   BMI: Body mass index is 34.99 kg/m. GEN: Well nourished, well developed well appearing WF in no acute distress  HEENT: normocephalic, atraumatic Neck: no JVD, carotid bruits, or masses Cardiac: RRR; no murmurs, rubs, or gallops, no edema   Respiratory:  clear to auscultation bilaterally, normal work of breathing GI: soft, nontender, nondistended, + BS MS: no deformity or atrophy  Skin: warm and dry, no rash Neuro:  Alert and Oriented x 3, Strength and sensation are intact, follows commands Psych: euthymic mood, full affect     Wt Readings from Last 3 Encounters:  12/05/15 223 lb 6 oz (101.3 kg)  11/27/15 221 lb (100.2 kg)  11/24/15 214 lb 15.2 oz (97.5 kg)      Studies/Labs Reviewed:   EKG:  EKG was ordered today and personally reviewed by me and demonstrates sinus bradycardia 57bpm, nonspecific  ST-T changes with TWI V2-V5, I, avL - appears stable  Recent Labs: 06/01/2015: TSH 0.60 11/26/2015: ALT 22; Magnesium 2.3 12/04/2015: BUN 10; Creatinine, Ser 0.78; Hemoglobin 10.4; Platelets 393.0; Potassium 4.0; Sodium 138   Lipid Panel Labs (Brief)  No results found for: CHOL, TRIG, HDL, CHOLHDL, VLDL, LDLCALC, LDLDIRECT    Additional studies/ records that were reviewed today include: Summarized above.    ASSESSMENT & PLAN:   1. Paroxysmal atrial fib - maintaining NSR on current regimen. Long disussion about what atrial fib means and the various ways of helping control it. We discussed importance of healthy weight loss and compliance with OSA, as well as how we weigh risk of bleeding versus risk of stroke. Continue current medications. If she has any further episodes would need to consider antiarrhythmic therapy. We also discussed AliveCor as a possible monitoring mechanism. Addendum: Hgb returned slightly lower than prior - see PCP note - primary care plans to further follow and evaluate - appreciate their attention to detail. Sent message to PA about this, thanking him for following this and asking him to let us know if we can help in any way. 2. Diastolic dysfunction - no evidence of HF on exam. Continue BP control.  3. Essential HTN - see below. 4. Orthostatic hypotension - no evidence of recurrence.  Will remove losartan from med list. I would not press for stricter BP control given her history of profound orthostasis. Continue current regimen.  Disposition: F/u with Dr. Johnsie Cancel in 3 months. (The patient's husband is a patient of his.) At this time will defer afib clinic referral given controlled symptoms.   Medication Adjustments/Labs and Tests Ordered: Current medicines are reviewed at length with the patient today.  Concerns regarding medicines are outlined above. Medication changes, Labs and Tests ordered today are summarized above and listed in the Patient Instructions accessible in Encounters.   Raechel Ache PA-C  12/05/2015 3:18 PM    [facility] [address], [place], [place]  [address] Phone: [phone]; Fax: [phone]   EP Attending  Patient seen and examined. Since her clinic visit a week ago, the patient has done well until she went into atrial fib last night at 10 p.m. She presented to the ED and was cardioverted 3 times and had ERAF each time. She has been treated with IV cardizem to control her ventricular rate. She feels better but can tell that she is still out of rhythm. The patient has not had syncope. She denies peripheral edema or sob. She has a h/o using too much caffeine but has cut down on her consumption in the past few weeks. She has not been prescribed an AA drug up until now. Her exam is notable for a well appearing woman in NAD. Lungs are clear and CV reveals and IRIR rhythm. Abdomen is soft and nontender and her extremities are without edema. Neuro is normal. ECG reveals atrial fib with a RVR/CVR. Tele the same.  A/P 1. PAF - she has failed DCCV. I have recommended she be given 300 mg of oral flecainide with hopes of converting her to NSR. She could undergo repeat DCCV at 1600 if she has not converted. She will need to be monitored for 2 hours after conversion. If she does not convert on her own, would suggest she undergo  repeat DCCV rather than admit to the hospital for observation. Finally, she will need to be placed on flecainide 100 mg twice daily with an  ECG in a week if she is able to be discharged home. This would be in addition to her beta blocker and her [place].  Mikle Bosworth.D.

## 2015-12-08 NOTE — ED Notes (Addendum)
Spoke with Dr. Ree Kida and she said that it will be at least an hour before she can discharge patient. Dr. Has been made aware again of continued Sinus Rhythm.

## 2015-12-08 NOTE — ED Notes (Signed)
Pt eating breakfast at this time.  

## 2015-12-08 NOTE — Consult Note (Addendum)
CARDIOLOGY CONSULT NOTE   Patient ID: Rexanne Sadoff MRN: [id number] DOB/AGE: [birth year] 65 y.o.  Admit Date: 12/07/2015 Referring Physician: TRH-Niu MD Primary Physician: Leeanne Rio, PA-C Consulting Cardiologist: Sallyanne Kuster, MD Primary Cardiologist New-Requests Dr. Johnsie Cancel  Reason for Consultation: Atrial fib with RVR  Clinical Summary Ms. Rueb is a 65 y.o.female with known history of anxiety, depression, hypertension, obstructive sleep apnea, and GERD, who is not been seen by cardiologist, but wishes to be established with Dr. Johnsie Cancel, has been seen by Melina Copa PA on 12/05/2015, for posthospitalization evaluation of diastolic dysfunction, atypical chest pain, hypertension, and history of paroxysmal atrial fibrillation. She apparently had drug-induced lupus which improved with removal of HCTZ portion of Diovan HCTZ regimen and changed to plain Diovan.She was stable but did have continued complaints of back pain.  She was recently admitted to the hospital 11/9 through 11/13 after fall and profound orthostatic hypotension felt due to antihypertension medications which were adjusted. Diovan was discontinued. The patient fell sustaining a T 12 compression fracture. During that hospitalization she did have a brief episode of A. fib RVR and converted spontaneously on her own to normal sinus rhythm. She was started on ELIQUIS. She has been medically compliant, and does use CPAP as directed.  She was in her usual state of health when she was sitting in bed last evening playing a video game on her phone when she had sudden onset of rapid heart rhythm. She had recently bought an apple Y and checked her heart rate on the watch and found it to be 123 bpm. She called EMS and was brought to ER. She was not on diltiazem as an outpatient due to hypotension. She presented to the emergency room with complaints of A. fib RVR heart rate of 150 bpm. The patient had attempted cardioversion without  success 3. Patient's potassium was low at 3.3 which was repleted.  She is currently comfortable with exception of back pain from her fracture. Heart rate is labile 107-226 bpm A. fib. She is on a diltiazem drip at 10 mg an hour..  Allergies  Allergen Reactions  . Ace Inhibitors Cough  . Ace Inhibitors Cough  . Hctz [Hydrochlorothiazide] Other (See Comments)    Caused drug-induced LUPUS  . Other Other (See Comments)    Unknown anti-inflammatory: Caused extremely aggressive behavior (not Prednisone)  . Oxycodone     hallucinations  . Oxycodone Other (See Comments)    Hallucinations  . Sulfa Antibiotics Nausea And Vomiting  . Sulfur Nausea And Vomiting  . Voltaren [Diclofenac Sodium] Other (See Comments)    Hypersensitivity    Medications Scheduled Medications: . apixaban  5 mg Oral BID  . cholecalciferol  1,000 Units Oral QHS  . DULoxetine  30 mg Oral Daily  . furosemide  20 mg Intravenous Daily  . metoprolol  50 mg Oral BID  . potassium chloride  20 mEq Oral Once  . potassium chloride  40 mEq Oral Once     Infusions: . diltiazem (CARDIZEM) infusion 10 mg/hr (12/08/15 0720)     PRN Medications:  albuterol, ALPRAZolam, HYDROcodone-acetaminophen, ondansetron (ZOFRAN) IV, polyethylene glycol, senna-docusate, zolpidem   Past Medical History:  Diagnosis Date  . Anxiety   . Arthritis    neck and back  . Degenerative disorder of bone   . Depression   . Diastolic dysfunction   . GERD (gastroesophageal reflux disease)   . Herniated disc, cervical   . Hyperlipidemia   . Hypertension   . Lupus  HCTZ induced  . 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 Obstructive sleep apnea   . Orthostatic hypotension   . Osteopenia   . PAF (paroxysmal atrial fibrillation) ([place])   . Pinched nerve in neck   . T12 compression fracture ([place]) 11/2015  . Vitamin D deficiency   . Whiplash injury 06/07/2010    Past Surgical History:   Procedure Laterality Date  . APPENDECTOMY    . arm surgery Left     Family History  Problem Relation Age of Onset  . Cancer Mother   . Stroke Father   . Hypertension Father   . Diabetes Neg Hx     Social History Ms. Hain reports that she quit smoking about 2 years ago. Her smoking use included Cigarettes. She has quit using smokeless tobacco. Ms. Tharaldson reports that she drinks about 0.6 oz of alcohol per week .  Review of Systems Complete review of systems are found to be negative unless outlined in H&P above.  Physical Examination Blood pressure 131/67, pulse 97, temperature 98.4 F (36.9 C), temperature source Oral, resp. rate 13, height 5\' 7"  (1.702 m), weight 223 lb (101.2 kg), SpO2 98 %. No intake or output data in the 24 hours ending 12/08/15 0852  Telemetry:Atrial fibrillation labile heart rates between 107 and 129 bpm at rest  GEN: No acute distress HEENT: Conjunctiva and lids normal, oropharynx clear with moist mucosa. Neck: Supple, no elevated JVP or carotid bruits, no thyromegaly. Lungs: Clear to auscultation, nonlabored breathing at rest. Cardiac: Iregular rate and rhythm, no S3 or significant systolic murmur, no pericardial rub. Abdomen: Soft, nontender, no hepatomegaly, bowel sounds present, no guarding or rebound. Extremities: No pitting edema, distal pulses 2+. Skin: Warm and dry. Musculoskeletal: No kyphosis. Complaints of back pain, especially with movement Neuropsychiatric: Alert and oriented x3, affect grossly appropriate.  Prior Cardiac Testing/Procedures Myoview 09/07/15 Study Highlights    Nuclear stress EF: 48%.  There was no ST segment deviation noted during stress.  The study is normal.  The left ventricular ejection fraction is mildly decreased (45-54%).  Normal resting and stress perfusion. No ischemia or infarction EF 48% mild diffuse hypokinesis suggest echo or MRI correlation    2D ECHO: 10/24/2015 LV EF: 55% - 60% Study  Conclusions - Left ventricle: The cavity size was normal. Wall thickness was increased in a pattern of mild LVH. Systolic function was normal. The estimated ejection fraction was in the range of 55% to 60%. Wall motion was normal; there were no regional wall motion abnormalities. Features are consistent with a pseudonormal left ventricular filling pattern, with concomitant abnormal relaxation and increased filling pressure (grade 2 diastolic dysfunction). - Left atrium: The atrium was moderately dilated. Impressions: - Normal LV systolic function; grade 2 diastolic dysfunction; mild LAE. Lab Results  Basic Metabolic Panel:  Recent Labs Lab 12/04/15 1125 12/07/15 2340  NA 138 138  K 4.0 3.3*  CL 103 103  CO2 27 24  GLUCOSE 118* 129*  BUN 10 9  CREATININE 0.78 0.75  CALCIUM 9.4 9.4   CBC:  Recent Labs Lab 12/04/15 1125 12/07/15 2340  WBC 8.1 10.6*  HGB 10.4* 12.3  HCT 31.4* 37.2  MCV 88.6 88.8  PLT 393.0 414*    Cardiac Enzymes:  Recent Labs Lab 12/08/15 0252  TROPONINI 0.16*    Radiology: Dg Chest 2 View  Result Date: 12/08/2015 CLINICAL DATA:  Acute onset of atrial fibrillation and shortness  of breath. Initial encounter. EXAM: CHEST  2 VIEW COMPARISON:  None. FINDINGS: The lungs are well-aerated. Vascular congestion is noted. Mild peribronchial thickening is noted. There is no evidence of focal opacification, pleural effusion or pneumothorax. The heart is normal in size; the mediastinal contour is within normal limits. No acute osseous abnormalities are seen. IMPRESSION: Vascular congestion noted.  Mild peribronchial thickening noted. Electronically Signed   By: Garald Balding M.D.   On: 12/08/2015 00:54     ECG: Will order. Not completed in ER.   Impression and Recommendations  1. Atrial fib with RVR: Patient had similar episode during hospitalization approximately 3 weeks ago. The patient converted to normal sinus rhythm spontaneously. She  was not sent home on diltiazem. She states she never received a prescription for this although discharge summary and follow-up appointment state that she is on diltiazem 120 mg daily   She was in her usual state of health sitting in the bed playing a video game when heart rate increased suddenly became irregular and rapid. She is currently on IV diltiazem at 10 mg an hour being titrated for blood pressure. Heart rate remains elevated between 107 and 129 bpm. She had attempted cardioversion 3 since admission to the ER which was unsuccessful.  Would transition to by mouth diltiazem, 30 mg every 6 hours. Eventually returned her to full dose 120 mg XL daily Blood pressure is stable at 131/67 on diltiazem drip. Continue ELIQUIS 5 mg twice a day. There is evidence of elevated white blood cells. Would check UA.  2. Hypertension: History of orthostatic hypotension with discontinuation of Diovan at home due to fall. Blood pressure stable now.  3. Hypokalemia: Now being repleted.  Can likely return home in a.m. when she has been started back on by mouth medication and heart rate is better controlled.  Signed: Phill Myron. [place] NP [place]  12/08/2015, 8:52 AM Co-Sign MD  EP Attending  Patient seen and examined. Please refer to my consult note dictated seperately.  Mikle Bosworth.D.

## 2015-12-08 NOTE — Sedation Documentation (Signed)
Pt back in A-fib.

## 2015-12-08 NOTE — ED Notes (Addendum)
No response from cardiology doc. Had receptionist page again.
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
## 2015-12-08 NOTE — ED Notes (Signed)
Pt reports that she just began to have a "not right feeling" in her chest.  Her heart rate has increased, RN titrated the Cardizem up 2.5 to 7.5 per order.

## 2015-12-08 NOTE — ED Notes (Addendum)
Cardiology team notified of Sinus Rhythm and is texting Dr. Lovena Le now.

## 2015-12-08 NOTE — ED Notes (Signed)
Paged Dr. Lovena Le. Patient converted to NSR.

## 2015-12-08 NOTE — Sedation Documentation (Signed)
Pt still in Afib

## 2015-12-08 NOTE — ED Notes (Signed)
Dr. Lovena Le ordered flecainide tablet 300mg  for patient's AFIB.  If she converts, she needs to be monitored for 1-2 hours prior to being discharged.  If patient ends up converting and staying converted patient does not have to be admitted.

## 2015-12-08 NOTE — Discharge Summary (Signed)
Physician Discharge Summary  Shannon [name] [id number] DOB: Sep 21, 1950 DOA: 12/07/2015  PCP: Shannon Rio, PA-C  Admit date: 12/07/2015 Discharge date: 12/08/2015  Time spent: 45 minutes  Recommendations for Outpatient Follow-up:  Patient will be discharged to home.  Patient will need to follow up with primary care provider within one week of discharge.  Patient should continue medications as prescribed.  Patient should follow a heart healthy diet.   Discharge Diagnoses:  Principal Problem:   Atrial fibrillation with RVR (Shannon [name]) Active Problems:   Essential hypertension   Anxiety and depression   Hypokalemia   Chronic diastolic CHF (congestive heart failure) (Shannon [name])   Discharge Condition: Stable  Diet recommendation: Heart healthy  Filed Weights   12/07/15 2334  Weight: 101.2 kg (223 lb)    History of present illness:  On 12/08/2015 by Shannon [name] a 65 y.o.femalewith medical history significant of hypertension, GERD, depression, anxiety, OSA on CPAP, dCHF, recently diagnosed A fib on Eliquis,who presents with palpitation and shortness breast.  Patient was diagnosed as atrial fibrillation 3 weeks ago, and started with metoprolol and Eliquis. She states that she started having heart racing at about ~10 PM. It is associated with shortness of breath and heart racing. She denies chest pain, fever, chills, cough. She has constipation, but no nausea, vomiting, diarrhea, abdominal pain. No dysuria or burning on urination. No unilateral weakness. No dizziness or lightheadedness. Pt was started with cardizem gtt and her SOB has improved.  Hospital Course:  Atrial fibrillation with RVR -Triggering factor is not clear. potassium is low at 3.3 and magnesium 2.1  -No signs of infection, pending UA -CHADSVASC 3 -Started a few weeks ago, and was supposed to be on diltiazem -Echocardiogram 10/24/2015 showed an EF of 0000000, grade 2 diastolic  dysfunction -Echocardiogram 11/18/2015 showed EF of 55-60% -Myoview 09/07/2015 showed EF of 48%, normal resting and stress perfusion, no ischemia or infarction -Cardioversion was attempted 3 times in the emergency department overnight -Continue Eliquis -Was placed on Cardizem drip -TSH 1.002 -Cardiology consulted and appreciated -EP, Dr. Lovena [name], consulted. Patient given flecainide 300mg  PO.  Converted back to sinus rhythm.  Recommended patient continue flecainide 100mg  BID and metoprolol and follow up in 2 weeks.  Chronic diastolic CHF (congestive heart failure)  -Echocardiogram as stated above -BNP mildly elevated at 341 -Chest x-ray showed mild vascular congestion -Suspect due to atrial fibrillation with RVR -Patient started on Lasix IV 20 mg daily -Continue metoprolol  Elevated troponin -Upon admission, troponin 0.16, currently trending downward to 0.06 -Suspect secondary to demand ischemia and atrial fibrillation with RVR  Essential hypertension -continue metoprolol -on IV lasix  Hypokalemia -Replacing, continue to monitor -Magnesium normal, 2.1  Depression and anxiety -Continue Cymbalta, as needed Xanax  Consultants Cardiology   Procedures  Cardioversion by EDP  Discharge Exam: Vitals:   12/08/15 1600 12/08/15 1615  BP: 110/65 104/56  Pulse: (!) 58 (!) 52  Resp: 15 20  Temp:      Exam  General: Well developed, well nourished, NAD, appears stated age  HEENT: NCAT, mucous membranes moist.   Cardiovascular: S1 S2 auscultated, regular  Respiratory: Clear to auscultation bilaterally with equal chest rise  Abdomen: Soft, nontender, nondistended, + bowel sounds  Extremities: warm dry without cyanosis clubbing or edema  Neuro: AAOx3, nonfocal  Psych: Normal affect and demeanor with intact judgement and insight  Discharge Instructions Discharge Instructions    Discharge instructions    Complete by:  As directed  Patient will be discharged to  home. Follow up with the primary care physician in one week. Followed Dr. Lovena [name], cardiologist [place] within 2 weeks. Patient to resume normal activity as tolerated. Follow heart healthy diet.     Current Discharge Medication List    START taking these medications   Details  flecainide (TAMBOCOR) 100 MG tablet Take 1 tablet (100 mg total) by mouth 2 (two) times daily. Qty: 60 tablet, Refills: 0      CONTINUE these medications which have NOT CHANGED   Details  albuterol (PROVENTIL HFA;VENTOLIN HFA) 108 (90 BASE) MCG/ACT inhaler Inhale 1 puff into the lungs every 4 (four) hours as needed for wheezing or shortness of breath.    ALPRAZolam (XANAX) 0.5 MG tablet Take 0.5 mg by mouth 3 (three) times daily as needed for anxiety.    apixaban (ELIQUIS) 5 MG TABS tablet Take 1 tablet (5 mg total) by mouth 2 (two) times daily. Qty: 60 tablet, Refills: 0    Cholecalciferol (VITAMIN D-3 PO) Take 1 capsule by mouth at bedtime.    DULoxetine (CYMBALTA) 30 MG capsule Take 1 capsule (30 mg total) by mouth daily. Qty: 30 capsule, Refills: 5    HYDROcodone-acetaminophen (NORCO/VICODIN) 5-325 MG tablet Take 1 tablet by mouth every 8 (eight) hours as needed for severe pain. Qty: 30 tablet, Refills: 0    metoprolol (LOPRESSOR) 50 MG tablet Take 1 tablet (50 mg total) by mouth 2 (two) times daily. Qty: 60 tablet, Refills: 0      STOP taking these medications     ergocalciferol (VITAMIN D2) 50000 units capsule        Allergies  Allergen Reactions  . Ace Inhibitors Cough  . Ace Inhibitors Cough  . Hctz [Hydrochlorothiazide] Other (See Comments)    Caused drug-induced LUPUS  . Other Other (See Comments)    Unknown anti-inflammatory: Caused extremely aggressive behavior (not Prednisone)  . Oxycodone     hallucinations  . Oxycodone Other (See Comments)    Hallucinations  . Sulfa Antibiotics Nausea And Vomiting  . Sulfur Nausea And Vomiting  . Voltaren [Diclofenac Sodium] Other (See Comments)      Hypersensitivity   Follow-up Information    Shannon Rio, PA-C. Schedule an appointment as soon as possible for a visit.   Specialty:  Family Medicine Why:  Hospital follow up  Contact information: [address] [place] [address] [phone]        Shannon Peru, MD. Schedule an appointment as soon as possible for a visit in 2 week(s).   Specialty:  Cardiology Why:  Hospital follow up  Contact information: [id number] [place] [address] [phone]            The results of significant diagnostics from this hospitalization (including imaging, microbiology, ancillary and laboratory) are listed below for reference.    Significant Diagnostic Studies: Dg Chest 2 View  Result Date: 12/08/2015 CLINICAL DATA:  Acute onset of atrial fibrillation and shortness of breath. Initial encounter. EXAM: CHEST  2 VIEW COMPARISON:  None. FINDINGS: The lungs are well-aerated. Vascular congestion is noted. Mild peribronchial thickening is noted. There is no evidence of focal opacification, pleural effusion or pneumothorax. The heart is normal in size; the mediastinal contour is within normal limits. No acute osseous abnormalities are seen. IMPRESSION: Vascular congestion noted.  Mild peribronchial thickening noted. Electronically Signed   By: Garald Balding M.D.   On: 12/08/2015 00:54   Dg Chest 2 View  Result Date: 11/15/2015 CLINICAL DATA:  [place] today with pain in the lower back EXAM: CHEST  2 VIEW COMPARISON:  None. FINDINGS: No active infiltrate or effusion is seen. Mediastinal and hilar contours are unremarkable. The heart is within upper limits normal. No acute bony abnormality is seen. IMPRESSION: No active lung disease. No fracture is seen. The heart is upper normal in size. Electronically Signed   By: Ivar Drape M.D.   On: 11/15/2015 15:12   Dg Lumbar Spine 2-3 Views  Result Date: 11/15/2015 CLINICAL DATA:  [place] today with pain in the back  EXAM: LUMBAR SPINE - 2-3 VIEW COMPARISON:  None. FINDINGS: The lumbar vertebrae are in normal alignment. Intervertebral disc spaces appear normal with the exception of degenerative disc disease at L5-S1. However, there does appear to be a partial compression deformity of the anterior superior aspect of T12 vertebral body most likely acute. No retropulsion is seen. CT or MRI could be useful for further evaluation if indicated. IMPRESSION: 1. Suspect acute compression fracture of the anterior superior aspect of T12 vertebral body. Consider CT or MRI if further assessment is warranted. 2. Degenerative disc disease at L5-S1 Electronically Signed   By: Ivar Drape M.D.   On: 11/15/2015 15:14   Dg Pelvis 1-2 Views  Result Date: 11/15/2015 CLINICAL DATA:  [place] today with pain in the low back EXAM: PELVIS - 1-2 VIEW COMPARISON:  None. FINDINGS: Both hips are normal position with normal hip joint spaces. The pelvic rami are intact. The SI joints are corticated. The sacral foramina appear normal. No acute fracture is seen. IMPRESSION: Negative. Electronically Signed   By: Ivar Drape M.D.   On: 11/15/2015 15:15   Ct Thoracic Spine Wo Contrast  Result Date: 11/15/2015 CLINICAL DATA:  [place] today. Back pain. T12 compression fracture on radiographs. Initial encounter. EXAM: CT THORACIC SPINE WITHOUT CONTRAST TECHNIQUE: Multidetector CT imaging of the thoracic spine was performed without intravenous contrast administration. Multiplanar CT image reconstructions were also generated. COMPARISON:  Chest and lumbar spine radiographs earlier today FINDINGS: Alignment: No subluxation. Vertebrae: As seen on earlier radiographs, there is a fracture of T12 involving the superior endplate and anterior vertebral body with minimal vertebral body height loss predominantly on the left. There is no retropulsion or posterior element fracture identified. No additional thoracic spine fracture is identified. Paraspinal and other soft tissues:  Aortic atherosclerosis without aneurysm. Small sliding hiatal hernia. Coronary artery atherosclerosis. Disc levels: Mild thoracic spondylosis. No evidence of significant spinal stenosis on this non myelographic examination. Mild to moderate left neural foraminal stenosis at T7-8 due to facet arthrosis. IMPRESSION: 1. Acute appearing T12 compression fracture with minimal height loss. 2. Aortic atherosclerosis. Electronically Signed   By: Logan Bores M.D.   On: 11/15/2015 16:50    Microbiology: No results found for this or any previous visit (from the past 240 hour(s)).   Labs: Basic Metabolic Panel:  Recent Labs Lab 12/04/15 1125 12/07/15 2340 12/08/15 0945  NA 138 138 138  K 4.0 3.3* 3.4*  CL 103 103 102  CO2 27 24 25   GLUCOSE 118* 129* 116*  BUN 10 9 6   CREATININE 0.78 0.75 0.70  CALCIUM 9.4 9.4 9.5  MG  --   --  2.1   Liver Function Tests: No results for input(s): AST, ALT, ALKPHOS, BILITOT, PROT, ALBUMIN in the last 168 hours. No results for input(s): LIPASE, AMYLASE in the last 168 hours. No results for input(s): AMMONIA in the last 168 hours. CBC:  Recent Labs Lab 12/04/15 1125 12/07/15 2340  WBC 8.1 10.6*  HGB 10.4* 12.3  HCT 31.4* 37.2  MCV 88.6 88.8  PLT 393.0 414*   Cardiac Enzymes:  Recent Labs Lab 12/08/15 0252 12/08/15 0945 12/08/15 1539  TROPONINI 0.16* 0.06* 0.06*   BNP: BNP (last 3 results)  Recent Labs  12/08/15 0257  BNP 341.2*    ProBNP (last 3 results) No results for input(s): PROBNP in the last 8760 hours.  CBG: No results for input(s): GLUCAP in the last 168 hours.     SignedCristal Ford  [facility] 12/08/2015, 5:56 PM

## 2015-12-08 NOTE — Progress Notes (Signed)
PROGRESS NOTE    Shannon [name]  [id number] DOB: 06-24-1950 DOA: 12/07/2015 PCP: Leeanne Rio, PA-C   Chief Complaint  Patient presents with  . Atrial Fibrillation    Brief Narrative:  HPI On 12/08/2015 by Dr. Ivor Costa Shannon [name] is a 65 y.o. female with medical history significant of hypertension, GERD, depression, anxiety, OSA on CPAP, dCHF, recently diagnosed A fib on Eliquis,who presents with palpitation and shortness breast.  Patient was diagnosed as atrial fibrillation 3 weeks ago, and started with metoprolol and Eliquis. She states that she started having heart racing at about ~10 PM. It is associated with shortness of breath and heart racing. She denies chest pain, fever, chills, cough. She has constipation, but no nausea, vomiting, diarrhea, abdominal pain. No dysuria or burning on urination. No unilateral weakness. No dizziness or lightheadedness. Pt was started with cardizem gtt and her SOB has improved. Assessment & Plan   Atrial fibrillation with RVR -Triggering factor is not clear. potassium is low at 3.3 and magnesium 2.1  -No signs of infection, pending UA -CHADSVASC 3 -Started a few weeks ago, and was supposed to be on diltiazem -Echocardiogram 10/24/2015 showed an EF of 0000000, grade 2 diastolic dysfunction -Echocardiogram 11/18/2015 showed EF of 55-60% -Myoview 09/07/2015 showed EF of 48%, normal resting and stress perfusion, no ischemia or infarction -Cardioversion was attempted 3 times in the emergency department overnight -Continue Eliquis -Currently on Cardizem drip -TSH 1.002 -Cardiology consulted and appreciated  Chronic diastolic CHF (congestive heart failure)  -Echocardiogram as stated above -BNP mildly elevated at 341 -Chest x-ray showed mild vascular congestion -Suspect due to atrial fibrillation with RVR -Patient started on Lasix IV 20 mg daily -Continue metoprolol -Monitor intake and output, daily weights  Elevated  troponin -Upon admission, troponin 0.16, currently trending downward to 0.06 -Suspect secondary to demand ischemia and atrial fibrillation with RVR  Essential hypertension -continue metoprolol -on IV lasix  Hypokalemia -Replacing, continue to monitor -Magnesium normal, 2.1  Depression and anxiety -Continue Cymbalta, as needed Xanax  DVT Prophylaxis  Eliquis  Code Status: Full  Family Communication: Husband at bedside  Disposition Plan: Currently observation.   Consultants Cardiology   Procedures  Cardioversion by EDP  Antibiotics   Anti-infectives    None      Subjective:   [name] seen and examined today.  Denies currently chest pain, shortness of breath, abdominal pain, nausea, vomiting, diarrhea, constipation, headache, dizziness. Endorses frequent urination, but with no pain or burning.  Does have some chest fullness when her heart rate increases and feels short of breath.  Objective:   Vitals:   12/08/15 0945 12/08/15 0952 12/08/15 1015 12/08/15 1033  BP: 152/91 152/91 155/78 (!) 137/101  Pulse: 98 100 86   Resp: 19  22   Temp:      TempSrc:      SpO2: 98%  98%   Weight:      Height:        Intake/Output Summary (Last 24 hours) at 12/08/15 1143 Last data filed at 12/08/15 0934  Gross per 24 hour  Intake          2519.71 ml  Output                0 ml  Net          2519.71 ml   Filed Weights   12/07/15 2334  Weight: 101.2 kg (223 lb)    Exam  General: Well developed, well nourished, NAD, appears  stated age  25: NCAT, mucous membranes moist.   Cardiovascular: S1 S2 auscultated, irregularly irregular  Respiratory: Clear to auscultation bilaterally with equal chest rise  Abdomen: Soft, nontender, nondistended, + bowel sounds  Extremities: warm dry without cyanosis clubbing or edema  Neuro: AAOx3, nonfocal  Psych: Normal affect and demeanor with intact judgement and insight   Data Reviewed: I have personally reviewed  following labs and imaging studies  CBC:  Recent Labs Lab 12/04/15 1125 12/07/15 2340  WBC 8.1 10.6*  HGB 10.4* 12.3  HCT 31.4* 37.2  MCV 88.6 88.8  PLT 393.0 [id number]*   Basic Metabolic Panel:  Recent Labs Lab 12/04/15 1125 12/07/15 2340 12/08/15 0945  NA 138 138 138  K 4.0 3.3* 3.4*  CL 103 103 102  CO2 27 24 25   GLUCOSE 118* 129* 116*  BUN 10 9 6   CREATININE 0.78 0.75 0.70  CALCIUM 9.4 9.4 9.5  MG  --   --  2.1   GFR: Estimated Creatinine Clearance: 86.8 mL/min (by C-G formula based on SCr of 0.7 mg/dL). Liver Function Tests: No results for input(s): AST, ALT, ALKPHOS, BILITOT, PROT, ALBUMIN in the last 168 hours. No results for input(s): LIPASE, AMYLASE in the last 168 hours. No results for input(s): AMMONIA in the last 168 hours. Coagulation Profile:  Recent Labs Lab 12/08/15 0252  INR 1.18   Cardiac Enzymes:  Recent Labs Lab 12/08/15 0252 12/08/15 0945  TROPONINI 0.16* 0.06*   BNP (last 3 results) No results for input(s): PROBNP in the last 8760 hours. HbA1C: No results for input(s): HGBA1C in the last 72 hours. CBG: No results for input(s): GLUCAP in the last 168 hours. Lipid Profile:  Recent Labs  12/08/15 0305  CHOL 189  HDL 48  LDLCALC 118*  TRIG 116  CHOLHDL 3.9   Thyroid Function Tests:  Recent Labs  12/08/15 0255  TSH 1.002   Anemia Panel: No results for input(s): VITAMINB12, FOLATE, FERRITIN, TIBC, IRON, RETICCTPCT in the last 72 hours. Urine analysis:    Component Value Date/Time   COLORURINE YELLOW 11/15/2015 1838   APPEARANCEUR HAZY (A) 11/15/2015 1838   LABSPEC 1.024 11/15/2015 1838   PHURINE 5.5 11/15/2015 1838   GLUCOSEU NEGATIVE 11/15/2015 1838   HGBUR NEGATIVE 11/15/2015 1838   BILIRUBINUR SMALL (A) 11/15/2015 1838   KETONESUR 15 (A) 11/15/2015 1838   PROTEINUR 30 (A) 11/15/2015 1838   NITRITE NEGATIVE 11/15/2015 1838   LEUKOCYTESUR NEGATIVE 11/15/2015 1838   Sepsis  Labs: @LABRCNTIP (procalcitonin:4,lacticidven:4)  )No results found for this or any previous visit (from the past 240 hour(s)).    Radiology Studies: Dg Chest 2 View  Result Date: 12/08/2015 CLINICAL DATA:  Acute onset of atrial fibrillation and shortness of breath. Initial encounter. EXAM: CHEST  2 VIEW COMPARISON:  None. FINDINGS: The lungs are well-aerated. Vascular congestion is noted. Mild peribronchial thickening is noted. There is no evidence of focal opacification, pleural effusion or pneumothorax. The heart is normal in size; the mediastinal contour is within normal limits. No acute osseous abnormalities are seen. IMPRESSION: Vascular congestion noted.  Mild peribronchial thickening noted. Electronically Signed   By: Garald Balding M.D.   On: 12/08/2015 00:54     Scheduled Meds: . apixaban  5 mg Oral BID  . cholecalciferol  1,000 Units Oral QHS  . diltiazem  30 mg Oral Q6H  . DULoxetine  30 mg Oral Daily  . furosemide  20 mg Intravenous Daily  . metoprolol  50 mg Oral BID  . potassium  chloride  20 mEq Oral Once   Continuous Infusions: . diltiazem (CARDIZEM) infusion 10 mL/hr at 12/08/15 0956     LOS: 0 days   Time Spent in minutes   30 minutes  Simya Tercero D.O. on 12/08/2015 at 11:43 AM  Between 7am to 7pm - Pager - [phone]  After 7pm go to [email or web address] - password [email or web address]  And look for the night coverage person covering for me after hours  [facility] Office  [phone]

## 2015-12-08 NOTE — ED Notes (Signed)
Pt still in AFIB at this time.

## 2015-12-09 LAB — HEMOGLOBIN A1C
HEMOGLOBIN A1C: 5.5 % (ref 4.8–5.6)
MEAN PLASMA GLUCOSE: 111 mg/dL

## 2015-12-10 ENCOUNTER — Telehealth: Payer: Self-pay

## 2015-12-10 NOTE — Telephone Encounter (Signed)
Transition Care Management Follow-up Telephone Call   Date discharged? 12/07/15--Pt states she was never admitted. There were no rooms for admission, therefore stayed in Emergency Department until discharge.    How have you been since you were released from the hospital? "alright"   Do you understand why you were in the hospital? yes, "a-fib"   Do you understand the discharge instructions? yes   Where were you discharged to? Home.    Items Reviewed:  Medications reviewed: yes, same with addition of Tambocor  Allergies reviewed: yes  Dietary changes reviewed: yes, decrease coffee to 1 cup/day  Referrals reviewed: none.    Functional Questionnaire:   Activities of Daily Living (ADLs):   She states they are independent in the following: ambulation, bathing and hygiene, feeding, continence, grooming, toileting and dressing States they require assistance with the following: none   Any transportation issues/concerns?: no   Any patient concerns? no   Confirmed importance and date/time of follow-up visits scheduled yes  Provider Appointment booked with PCP 12/11/15.  Confirmed with patient if condition begins to worsen call PCP or go to the ER.  Patient was given the office number and encouraged to call back with question or concerns.  : yes

## 2015-12-11 ENCOUNTER — Ambulatory Visit (INDEPENDENT_AMBULATORY_CARE_PROVIDER_SITE_OTHER): Payer: No Typology Code available for payment source | Admitting: Physician Assistant

## 2015-12-11 ENCOUNTER — Encounter: Payer: Self-pay | Admitting: Physician Assistant

## 2015-12-11 VITALS — BP 116/60 | HR 46 | Temp 98.8°F | Resp 14 | Ht 67.0 in | Wt 218.0 lb

## 2015-12-11 DIAGNOSIS — R001 Bradycardia, unspecified: Secondary | ICD-10-CM

## 2015-12-11 DIAGNOSIS — I4891 Unspecified atrial fibrillation: Secondary | ICD-10-CM

## 2015-12-11 DIAGNOSIS — E876 Hypokalemia: Secondary | ICD-10-CM | POA: Diagnosis not present

## 2015-12-11 LAB — COMPREHENSIVE METABOLIC PANEL
ALT: 12 U/L (ref 0–35)
AST: 11 U/L (ref 0–37)
Albumin: 4 g/dL (ref 3.5–5.2)
Alkaline Phosphatase: 108 U/L (ref 39–117)
BUN: 15 mg/dL (ref 6–23)
CO2: 28 meq/L (ref 19–32)
Calcium: 9.4 mg/dL (ref 8.4–10.5)
Chloride: 103 mEq/L (ref 96–112)
Creatinine, Ser: 0.9 mg/dL (ref 0.40–1.20)
GFR: 66.89 mL/min (ref >60.00)
GLUCOSE: 98 mg/dL (ref 70–99)
POTASSIUM: 4 meq/L (ref 3.5–5.1)
SODIUM: 138 meq/L (ref 135–145)
TOTAL PROTEIN: 6.7 g/dL (ref 6.0–8.3)
Total Bilirubin: 0.4 mg/dL (ref 0.2–1.2)

## 2015-12-11 MED ORDER — HYDROCODONE-ACETAMINOPHEN 5-325 MG PO TABS: 1.0000 | ORAL_TABLET | Freq: Three times a day (TID) | ORAL | 0 refills | Status: DC | PRN
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
## 2015-12-11 NOTE — Patient Instructions (Signed)
I have spoken with the Cardiology office.  They recommend we cut the Flecainide in half (50 mg or 1/2 tablet twice daily).  If heart rate not improving we may need to tweak the dose of Metoprolol as well.   Symptoms should improve with this change.  Continue all other medications as directed. Stay well-hydrated and get plenty of rest.   I am rechecking labs today.  I want to see you on Thursday for reassessment.   If you note any significant shortness of breath, racing heart/palpitations, chest discomfort, please call 911.   Continue pain medication as directed for now.

## 2015-12-11 NOTE — Progress Notes (Signed)
Pre visit review using our clinic review tool, if applicable. No additional management support is needed unless otherwise documented below in the visit note. 

## 2015-12-13 ENCOUNTER — Ambulatory Visit (INDEPENDENT_AMBULATORY_CARE_PROVIDER_SITE_OTHER): Payer: No Typology Code available for payment source | Admitting: Physician Assistant

## 2015-12-13 ENCOUNTER — Encounter: Payer: Self-pay | Admitting: Physician Assistant

## 2015-12-13 VITALS — BP 130/72 | HR 63 | Temp 99.2°F | Resp 16 | Ht 67.0 in | Wt 217.0 lb

## 2015-12-13 DIAGNOSIS — I4891 Unspecified atrial fibrillation: Secondary | ICD-10-CM
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
## 2015-12-13 NOTE — Patient Instructions (Signed)
EKG shows improved pulse but no changes from prior.  Continue current medication regimen until follow-up with Cardiology.  Keep close eye on heart rate and blood pressure. If you notice low pulse < 60 or high pulse above 100, please let us or Cardiology know. If there is any recurrence of palpitations, please call 911 or go to the ER.  Continue staying active as this will help to recondition you so you have more energy and do not get winded as easily.   Again, follow-up with Cardiology next week as scheduled.

## 2015-12-13 NOTE — Progress Notes (Signed)
Pre visit review using our clinic review tool, if applicable. No additional management support is needed unless otherwise documented below in the visit note. 

## 2015-12-16 ENCOUNTER — Encounter: Payer: Self-pay | Admitting: Physician Assistant

## 2015-12-17 ENCOUNTER — Telehealth: Payer: Self-pay | Admitting: Physician Assistant

## 2015-12-17 NOTE — Telephone Encounter (Signed)
Spoke with patient concerning MyChart message.  Endorses doing very well since EMS assessment.  HR averaging -- 70-80. Is taking medications as directed.  Energy level is still doing well. Denies recurrence of racing heart. Denies lightheadedness, dizziness, SOB, chest pain.   Patient did speak with Cardiology -- appointment still on Wednesday but has been switched to see the Electrophysiologist.   Discussed alarm signs/symptoms that would prompt immediate ER assessment. Patient and husband voice understanding and agreement with plan.

## 2015-12-17 NOTE — Telephone Encounter (Signed)
Received mychart message from patient regarding recurrent AF. Recently seen by Dr. Lovena Le and started on flecainide. Spoke with [facility] triage to help add patient in on EP schedule (or AF clinic if EP full). They will call patient with this information. Stayce Delancy PA-C

## 2015-12-19 ENCOUNTER — Encounter: Payer: Self-pay | Admitting: Internal Medicine

## 2015-12-19 ENCOUNTER — Ambulatory Visit (INDEPENDENT_AMBULATORY_CARE_PROVIDER_SITE_OTHER): Payer: No Typology Code available for payment source | Admitting: Internal Medicine

## 2015-12-19 ENCOUNTER — Ambulatory Visit: Payer: Self-pay | Admitting: Physician Assistant

## 2015-12-19 VITALS — BP 142/70 | HR 60 | Ht 67.0 in | Wt 218.9 lb

## 2015-12-19 DIAGNOSIS — I4891 Unspecified atrial fibrillation: Secondary | ICD-10-CM | POA: Diagnosis not present

## 2015-12-19 MED ORDER — FLECAINIDE ACETATE 50 MG PO TABS: 75.0000 mg | ORAL_TABLET | Freq: Two times a day (BID) | ORAL | 6 refills | Status: DC

## 2015-12-19 MED ORDER — METOPROLOL TARTRATE 50 MG PO TABS: 50.0000 mg | ORAL_TABLET | Freq: Two times a day (BID) | ORAL | 6 refills | Status: DC

## 2015-12-19 NOTE — Progress Notes (Signed)
HPI Shannon [name] returns today for followup of PAF. She is a pleasant obese middle aged woman who has developed atrial fib with a RVR. She was initially placed on 100 mg of flecainide along with 50 mg of lopressor, both taken bid. She developed symptomatic bradycardia and had her dose of flecainide reduced to 50 bid. She has had occaisional episodes of recurrent atrial fib with a  RVR. She experiences sob with her atrial fib.  Allergies  Allergen Reactions  . Ace Inhibitors Cough  . Hctz [Hydrochlorothiazide] Other (See Comments)    Caused drug-induced LUPUS  . Other Other (See Comments)    Unknown anti-inflammatory: Caused extremely aggressive behavior (not Prednisone)  . Oxycodone Other (See Comments)    Hallucinations  . Sulfa Antibiotics Nausea And Vomiting  . Sulfur Nausea And Vomiting  . Voltaren [Diclofenac Sodium] Other (See Comments)    Hypersensitivity     Current Outpatient Prescriptions  Medication Sig Dispense Refill  . albuterol (PROVENTIL HFA;VENTOLIN HFA) 108 (90 BASE) MCG/ACT inhaler Inhale 1 puff into the lungs every 4 (four) hours as needed for wheezing or shortness of breath.    . ALPRAZolam (XANAX) 0.5 MG tablet Take 0.5 mg by mouth 3 (three) times daily as needed for anxiety.    Marland Kitchen apixaban (ELIQUIS) 5 MG TABS tablet Take 1 tablet (5 mg total) by mouth 2 (two) times daily. 60 tablet 0  . Cholecalciferol (VITAMIN D-3 PO) Take 1 capsule by mouth at bedtime.    . DULoxetine (CYMBALTA) 30 MG capsule Take 1 capsule (30 mg total) by mouth daily. 30 capsule 5  . ergocalciferol (VITAMIN D2) 50000 units capsule Take 1 capsule (50,000 Units total) by mouth every 30 (thirty) days. 4 capsule 3  . HYDROcodone-acetaminophen (NORCO/VICODIN) 5-325 MG tablet Take 1 tablet by mouth every 8 (eight) hours as needed for severe pain. 30 tablet 0  . metoprolol (LOPRESSOR) 50 MG tablet Take 1 tablet (50 mg total) by mouth 2 (two) times daily. 60 tablet 6  . flecainide (TAMBOCOR) 50  MG tablet Take 1.5 tablets (75 mg total) by mouth 2 (two) times daily. 90 tablet 6   No current facility-administered medications for this visit.      Past Medical History:  Diagnosis Date  . Anxiety   . Arthritis    neck and back  . Degenerative disorder of bone   . Depression   . Diastolic dysfunction   . GERD (gastroesophageal reflux disease)   . Herniated disc, cervical   . Hyperlipidemia   . Hypertension   . Lupus    HCTZ induced  . 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 Obstructive sleep apnea   . Orthostatic hypotension   . Osteopenia   . PAF (paroxysmal atrial fibrillation) ([place])   . Pinched nerve in neck   . T12 compression fracture ([place]) 11/2015  . Vitamin D deficiency   . Whiplash injury 06/07/2010    ROS:   All systems reviewed and negative except as noted in the HPI.   Past Surgical History:  Procedure Laterality Date  . APPENDECTOMY    . arm surgery Left      Family History  Problem Relation Age of Onset  . Cancer Mother   . Stroke Father   . Hypertension Father   . Diabetes Neg Hx      Social History   Social History  . Marital status: Married  Spouse name: N/A  . Number of children: N/A  . Years of education: N/A   Occupational History  . Not on file.   Social History Main Topics  . Smoking status: Former Smoker    Types: Cigarettes    Quit date: 2015  . Smokeless tobacco: Former Systems developer     Comment: Pt started to use vaporizer-uses rarely  . Alcohol use 0.6 oz/week    1 Glasses of wine per week     Comment: social or occationally  . Drug use: No  . Sexual activity: Yes   Other Topics Concern  . Not on file   Social History Narrative   ** Merged History Encounter **         BP (!) 142/70   Pulse 60   Ht 5\' 7"  (1.702 m)   Wt 218 lb 14.4 oz (99.3 kg)   BMI 34.28 kg/m   Physical Exam:  obese appearing NAD HEENT: Unremarkable Neck:  No JVD, no  thyromegally Lymphatics:  No adenopathy Back:  No CVA tenderness Lungs:  Clear with no wheezes HEART:  IRegular rate rhythm, no murmurs, no rubs, no clicks Abd:  soft, positive bowel sounds, no organomegally, no rebound, no guarding Ext:  2 plus pulses, no edema, no cyanosis, no clubbing Skin:  No rashes no nodules Neuro:  CN II through XII intact, motor grossly intact  ECG - nsr   Assess/Plan: 1. PAF - we discussed the treatment options in detail. I have recommended she increase her flecainide to 75 bid. She will continue lopressor. If her HR goes back down, we will need to reduce the metoprolol to 25 bid. 2. HTN - her blood pressure is reasonably well controlled. If high when we see her back, consider additional medical therapy. 3. Sinus node dysfunction - she became quite bradycardic with 100 bid of flecainide and 50 bid of metoprolol. We discussed whether she will need additional medical adjustments. 4. Coags - she is tolerating her medications nicely.  Mikle Bosworth.D.

## 2015-12-19 NOTE — Patient Instructions (Addendum)
Medication Instructions:  Your physician has recommended you make the following change in your medication:  1) Increase Flecainide to 75 mg twice daily   Labwork: None ordered   Testing/Procedures: None ordered   Follow-Up: Your physician wants you to follow-up in: 6 weeks with Dr. Lovena Le  Any Other Special Instructions Will Be Listed Below (If Applicable).     If you need a refill on your cardiac medications before your next appointment, please call your pharmacy.

## 2015-12-22 ENCOUNTER — Encounter: Payer: Self-pay | Admitting: Physician Assistant

## 2015-12-24 ENCOUNTER — Encounter: Payer: Self-pay | Admitting: Physician Assistant

## 2015-12-24 ENCOUNTER — Telehealth: Payer: Self-pay | Admitting: Physician Assistant

## 2015-12-24 ENCOUNTER — Encounter: Payer: Self-pay | Admitting: Internal Medicine

## 2015-12-24 ENCOUNTER — Telehealth: Payer: Self-pay

## 2015-12-24 MED ORDER — APIXABAN 5 MG PO TABS: 5.0000 mg | ORAL_TABLET | Freq: Two times a day (BID) | ORAL | 1 refills | Status: DC

## 2015-12-24 MED ORDER — ALPRAZOLAM 0.5 MG PO TABS: 0.5000 mg | ORAL_TABLET | Freq: Three times a day (TID) | ORAL | 1 refills | Status: DC | PRN

## 2015-12-24 NOTE — Telephone Encounter (Signed)
Pt states that husband is at pharmacy to p/u eliquis and can not use the coupon card due to not having insurance and asking what should she do, please advise

## 2015-12-24 NOTE — Telephone Encounter (Signed)
Noted. Please have her let us know if she has any issue running out before her insurance takes effect.

## 2015-12-24 NOTE — Telephone Encounter (Signed)
Spoke with patient and she doesn't have insurance right now. She gets new insurance the 1st of the year. The pharmacy states they couldn't use the co-pay card without insurance. She called Cardiology to send in 1/2 a rx of the Eliquis which will cost her $200 instead of $400 for 30 days supply. At the 1st of the year she will be able to use the co-pay card and pay $10 a month.

## 2015-12-24 NOTE — Telephone Encounter (Signed)
Called, spoke with pt. Pt sent MyChart msg concerning HR 120s-150s. Pt stated HR 120-155 at 2 AM this morning. Pt stated she is taking all meds as prescribed. Pt has not missed any doses. Pt stated HR slowly decreased throughout the day. 2-3 hours ago HR 60s. HR now is 66. Pt stated she took 1/4 of a tablet (25 mg) extra this morning. Informed to please call our office for HR issues. Informed will speak with Dr. Lovena Le to advise. Informed I will call back tomorrow.

## 2015-12-25 NOTE — Telephone Encounter (Signed)
Called, spoke with pt. Pt stated HR 87 this AM. Pt stated she is feeling better today. Pt stated she has experienced some SOB in the past while taking the Flecainide, but today she is not. Dr. Lovena Le stated if symptoms return (HR 120-150s) will likely increase Flecianide to 100 mg twice a day and decrease Metoprolol. Informed pt to call our office if pt has SOB or HR increases or starts racing. Pt verbalized understanding and agreed with plan.

## 2015-12-26 ENCOUNTER — Other Ambulatory Visit: Payer: Self-pay | Admitting: Physician Assistant

## 2015-12-26 DIAGNOSIS — Z79899 Other long term (current) drug therapy: Secondary | ICD-10-CM

## 2015-12-27 ENCOUNTER — Encounter: Payer: Self-pay | Admitting: Physician Assistant

## 2015-12-27 ENCOUNTER — Ambulatory Visit: Payer: Self-pay | Admitting: Nurse Practitioner

## 2015-12-27 MED ORDER — HYDROCODONE-ACETAMINOPHEN 5-325 MG PO TABS: 1.0000 | ORAL_TABLET | Freq: Three times a day (TID) | ORAL | 0 refills | Status: DC | PRN

## 2015-12-28 ENCOUNTER — Telehealth: Payer: Self-pay | Admitting: Internal Medicine

## 2015-12-28 ENCOUNTER — Ambulatory Visit (INDEPENDENT_AMBULATORY_CARE_PROVIDER_SITE_OTHER): Payer: No Typology Code available for payment source | Admitting: Interventional Cardiology

## 2015-12-28 ENCOUNTER — Ambulatory Visit: Payer: No Typology Code available for payment source

## 2015-12-28 ENCOUNTER — Encounter: Payer: Self-pay | Admitting: Interventional Cardiology

## 2015-12-28 VITALS — BP 142/72 | HR 60 | Resp 22 | Ht 67.0 in | Wt 217.0 lb

## 2015-12-28 DIAGNOSIS — I5033 Acute on chronic diastolic (congestive) heart failure: Secondary | ICD-10-CM

## 2015-12-28 DIAGNOSIS — R0609 Other forms of dyspnea: Secondary | ICD-10-CM | POA: Diagnosis not present

## 2015-12-28 DIAGNOSIS — I10 Essential (primary) hypertension: Secondary | ICD-10-CM

## 2015-12-28 DIAGNOSIS — Z79899 Other long term (current) drug therapy: Secondary | ICD-10-CM

## 2015-12-28 DIAGNOSIS — I4891 Unspecified atrial fibrillation: Secondary | ICD-10-CM

## 2015-12-28 MED ORDER — METOPROLOL TARTRATE 25 MG PO TABS: 25.0000 mg | ORAL_TABLET | Freq: Two times a day (BID) | ORAL | 6 refills | Status: DC

## 2015-12-28 MED ORDER — POTASSIUM CHLORIDE CRYS ER 20 MEQ PO TBCR: 20.0000 meq | EXTENDED_RELEASE_TABLET | Freq: Every day | ORAL | 6 refills | Status: DC

## 2015-12-28 MED ORDER — FUROSEMIDE 40 MG PO TABS: 40.0000 mg | ORAL_TABLET | Freq: Every day | ORAL | 6 refills | Status: DC

## 2015-12-28 NOTE — Progress Notes (Signed)
Cardiology Office Note   Date:  12/28/2015   ID:  [name], DOB [birth year], MRN [id number]  PCP:  Leeanne Rio, PA-C    No chief complaint on file. SHOB   Wt Readings from Last 3 Encounters:  12/28/15 217 lb (98.4 kg)  12/19/15 218 lb 14.4 oz (99.3 kg)  12/13/15 217 lb (98.4 kg)       History of Present Illness: Shannon [name] is a 65 y.o. female  who has had paroxysmal atrial fibrillation. She came in today for a routine ECG after increase in her flecainide dose. She reported some shortness of breath to the nurse. She reports that when she gets up and tries to walk, she has significant shortness of breath. It resolves with sitting. Of note, she had an elevated BNP several weeks ago. She has not been on any diuretic. She previously was on HCTZ but this was stopped due to an allergy. She has had trouble with getting short of breath when lying flat. In the middle the night, she'll get up and sleep on a recliner with some relief. She has had a fracture in her back which has also been very limiting to her. She is not using a walker.  She was initially started on 100 mg of flecainide twice a day and had low blood pressure readings. Her flecainide was decreased to 50 mg twice a day. She had more atrial fibrillation on this lower dose of flecainide to her flecainide was increased 75 mg twice a day. She does report having one episode of atrial fibrillation on this 75 mg dose. No lightheadedness or syncope. No other palpitations other than the atrial fibrillation. Her activity is quite limited because of her back injury.  ECG today revealed anterior T-wave inversion. Going back through the chart, she has had a similar pattern on previous ECGs. Her QT interval was measured by the machine is elevated but in the precordial leads, the QT interval appears to be less than half of the RR interval.  Her blood pressure was quite elevated this morning. She states it was in the  [id number] systolic range when she first woke up. After taking her medicines, it is down into the 140s range.    Past Medical History:  Diagnosis Date  . Anxiety   . Arthritis    neck and back  . Degenerative disorder of bone   . Depression   . Diastolic dysfunction   . GERD (gastroesophageal reflux disease)   . Herniated disc, cervical   . Hyperlipidemia   . Hypertension   . Lupus    HCTZ induced  . 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 Obstructive sleep apnea   . Orthostatic hypotension   . Osteopenia   . PAF (paroxysmal atrial fibrillation) ([place])   . Pinched nerve in neck   . T12 compression fracture ([place]) 11/2015  . Vitamin D deficiency   . Whiplash injury 06/07/2010    Past Surgical History:  Procedure Laterality Date  . APPENDECTOMY    . arm surgery Left      Current Outpatient Prescriptions  Medication Sig Dispense Refill  . albuterol (PROVENTIL HFA;VENTOLIN HFA) 108 (90 BASE) MCG/ACT inhaler Inhale 1 puff into the lungs every 4 (four) hours as needed for wheezing or shortness of breath.    . ALPRAZolam (XANAX) 0.5 MG tablet Take 1 tablet (0.5 mg total) by mouth 3 (three) times daily  as needed for anxiety. 30 tablet 1  . apixaban (ELIQUIS) 5 MG TABS tablet Take 1 tablet (5 mg total) by mouth 2 (two) times daily. 60 tablet 1  . Cholecalciferol (VITAMIN D-3 PO) Take 1 capsule by mouth at bedtime.    . DULoxetine (CYMBALTA) 30 MG capsule Take 1 capsule (30 mg total) by mouth daily. 30 capsule 5  . ergocalciferol (VITAMIN D2) 50000 units capsule Take 1 capsule (50,000 Units total) by mouth every 30 (thirty) days. 4 capsule 3  . flecainide (TAMBOCOR) 50 MG tablet Take 1.5 tablets (75 mg total) by mouth 2 (two) times daily. 90 tablet 6  . furosemide (LASIX) 40 MG tablet Take 1 tablet (40 mg total) by mouth daily. 30 tablet 6  . HYDROcodone-acetaminophen (NORCO/VICODIN) 5-325 MG tablet Take 1 tablet by mouth every 8 (eight)  hours as needed for severe pain. 15 tablet 0  . metoprolol tartrate (LOPRESSOR) 25 MG tablet Take 1 tablet (25 mg total) by mouth 2 (two) times daily. 60 tablet 6  . potassium chloride SA (KLOR-CON M20) 20 MEQ tablet Take 1 tablet (20 mEq total) by mouth daily. 30 tablet 6   No current facility-administered medications for this visit.     Allergies:   Ace inhibitors; Hctz [hydrochlorothiazide]; Other; Oxycodone; Sulfa antibiotics; Sulfur; and Voltaren [diclofenac sodium]    Social History:  The patient  reports that she quit smoking about 2 years ago. Her smoking use included Cigarettes. She has quit using smokeless tobacco. She reports that she drinks about 0.6 oz of alcohol per week . She reports that she does not use drugs.   Family History:  The patient's family history includes Cancer in her mother; Hypertension in her father; Stroke in her father.    ROS:  Please see the history of present illness.   Otherwise, review of systems are positive for [facility], worse with lying falt or exertion.   All other systems are reviewed and negative.    PHYSICAL EXAM: VS:  BP (!) 142/72 (BP Location: Right Arm, Patient Position: Sitting, Cuff Size: Normal)   Pulse 60   Resp (!) 22   Ht 5\' 7"  (1.702 m)   Wt 217 lb (98.4 kg)   SpO2 96%   BMI 33.99 kg/m  , BMI Body mass index is 33.99 kg/m. GEN: Well nourished, well developed, in no acute distress  HEENT: normal  Neck: no JVD, carotid bruits, or masses Cardiac: bradycardic; no murmurs, rubs, or gallops,no edema  Respiratory:  clear to auscultation bilaterally, normal work of breathing GI: soft, nontender, nondistended, + BS MS: no deformity or atrophy  Skin: warm and dry, no rash Neuro:  Slow gait with walker Psych: euthymic mood, full affect   EKG:   The ekg ordered today demonstrates sinus bradycardia, anterior T wave inversions, QT interval appears to be < half of the RR interval   Recent Labs: 12/07/2015: Hemoglobin 12.3; Platelets  414 12/08/2015: B Natriuretic Peptide 341.2; Magnesium 2.1; TSH 1.002 12/11/2015: ALT 12; BUN 15; Creatinine, Ser 0.90; Potassium 4.0; Sodium 138   Lipid Panel    Component Value Date/Time   CHOL 189 12/08/2015 0305   TRIG 116 12/08/2015 0305   HDL 48 12/08/2015 0305   CHOLHDL 3.9 12/08/2015 0305   VLDL 23 12/08/2015 0305   LDLCALC 118 (H) 12/08/2015 0305     Other studies Reviewed: Additional studies/ records that were reviewed today with results demonstrating: prior labs and ECG.   ASSESSMENT AND PLAN:  1. Acute on  chronic diastolic heart failure: We'll start furosemide 40 mg daily along with potassium 20 mEq daily. She'll need follow-up in one week with electrolytes checked. 2. Atrial fibrillation: Continue flecainide at 75 mg twice a day. She had more atrial fibrillation when on 50 mg twice a day. Will defer antiarrhythmic therapy changes to Dr. Lovena Le. Given her bradycardia, will decrease metoprolol to 25 mg twice a day. 3. Hypertension: Hopefully, furosemide will help with blood pressure. She is concerned more about low blood pressure readings. If her blood pressure drops too low, she can hold her furosemide. I suspect she may still have some elevated blood pressure readings in the future. If that is the case, could start losartan and could consider stopping potassium at that point.   Current medicines are reviewed at length with the patient today.  The patient concerns regarding her medicines were addressed.  The following changes have been made:  As above  Labs/ tests ordered today include:  No orders of the defined types were placed in this encounter.   Recommend 150 minutes/week of aerobic exercise Low fat, low carb, high fiber diet recommended  Disposition:   FU in 1week with Dr. Lovena Le.   Signed, Larae Grooms, MD  12/28/2015 1:10 PM    [facility] [place] [address], [place], [place]  [address] Phone: [phone]; Fax: [phone]

## 2015-12-28 NOTE — Patient Instructions (Addendum)
Medication Instructions:  1) DECREASE Metoprolol to 25 mg twice a day 2) START Lasix 40 mg daily 3) START Potassium 20 mg daily  Labwork: None Ordered   Testing/Procedures: None Ordered   Follow-Up: Appointment on 01/09/15 at 1:30 (arriving at 1:15 PM) with Melina Copa, PA    Any Other Special Instructions Will Be Listed Below (If Applicable).     If you need a refill on your cardiac medications before your next appointment, please call your pharmacy.

## 2015-12-28 NOTE — Patient Instructions (Signed)
Nurse Visit   1) Reason for visit: EKG r/t Flecainide increase to 75 mg BID    2) Name of MD requesting visit: Dr. Lovena Le  3) H&P: Pt started increased dose of Flecainide on 12/19/15  4) ROS related to problem: Pt reports tolerating medication well. Pt reports SOB and elevated BP this morning (prior to taking medication this AM) 187/108.    5) Assessment and plan per MD: Dr. Irish Lack reviewed EKG. Pt should keep taking Flecainide 75 mg twice daily. Dr. Irish Lack saw pt as an add-on consult (for SOB and elevated BP). See Dr. Michael Boston note for visit.

## 2015-12-28 NOTE — Telephone Encounter (Signed)
Called, spoke with pt. Pt stated she is experiencing SOB, which was worse last night. Informed pt to sit upright instead of lying down. Asked if pt has taken Albuterol. Pt stated "no". Pt stated she thinks she is our of Albuterol. Pt was able to carry on conversation with little difficulty. Pt's BP this morning was 187/108. Pt's systolic BP was 123456 yesterday (pt could not recall exact BP reading). Pt stated she went out to a restaurant to eat last night. Informed pt needs to stay away form salty foods. Pt is scheduled to come in for a nurse visit for an EKG (related to Flecainide increase). Informed pt if SOB worsens or pt develops other symptoms (dizziness, CP) to report to the emergency department. Otherwise, we will see her in the office for her EKG at 11:00 AM. Informed pt may arrive early, if she wishes. Pt verbalized understanding and agreed with plan.

## 2015-12-28 NOTE — Telephone Encounter (Signed)
Pt c/o Shortness Of Breath: STAT if SOB developed within the last 24 hours or pt is noticeably SOB on the phone  1. Are you currently SOB (can you hear that pt is SOB on the phone)? Pt's husband calling 2. How long have you been experiencing SOB? Since yesterday 3. Are you SOB when sitting or when up moving around? Moving around 4.  Are you currently experiencing any other symptoms? Had some dizziness-but not at time of call

## 2016-01-02 NOTE — Addendum Note (Signed)
Addended by: [name] on: 01/02/2016 09:55 AM   Modules accepted: Orders

## 2016-01-04 ENCOUNTER — Telehealth: Payer: Self-pay | Admitting: Internal Medicine

## 2016-01-04 NOTE — Telephone Encounter (Signed)
New Message:   Pt says she needs talk to you about her afib. She says she is trying to get her numbers down.

## 2016-01-04 NOTE — Telephone Encounter (Signed)
Patient states she was in afib all day since 0900.  She took her medications as instructed. When she did not convert after taking her morning Flecainide, she took 25 mg a couple hours later. About an hour later, she took Flecainide 25 mg again because she still had not converted. She states Dr. Lovena Le told her taking an extra Flecainide would be fine.  She reports her HR ranged from 88-133 on her BP monitor while in afib.  After the second 25 mg dose was taken, she states her heart "calmed down." She feels better now and thinks she has converted back to NSR. Her HR is now reading 68 and she denies fluttering or any other symptom. BP stable. Instructed patient to continue to monitor VS and bring BP and HR log to OV already scheduled OV with [facility] Dunn 1/3. She was grateful for call.

## 2016-01-04 NOTE — Telephone Encounter (Signed)
This encounter was created in error - please disregard.

## 2016-01-05 NOTE — Telephone Encounter (Signed)
The patient would actually be better suited to follow up with EP NP or EP MD given her difficult to control atrial fib followed by Dr. Lovena Le - thanks!

## 2016-01-08 ENCOUNTER — Telehealth: Payer: Self-pay

## 2016-01-08 NOTE — Telephone Encounter (Signed)
Received incoming call from pt. Moved appt from [facility] Dunn's schedule to Dr. Tanna Furry schedule. Appt 01/09/16 arriving at 10:00 AM for 10:15 AM appt time. Pt verbalized understanding and agreed with plan.

## 2016-01-08 NOTE — Telephone Encounter (Signed)
Called, left voice message for pt. Informed Dr. Lovena Le has an appointment opening for tomorrow (1/3). I would like to move pt's appt from [facility] Dunn's schedule to Dr. Tanna Furry. Requested call back asap.

## 2016-01-09 ENCOUNTER — Ambulatory Visit (INDEPENDENT_AMBULATORY_CARE_PROVIDER_SITE_OTHER): Payer: BLUE CROSS/BLUE SHIELD | Admitting: Internal Medicine

## 2016-01-09 ENCOUNTER — Telehealth: Payer: Self-pay

## 2016-01-09 ENCOUNTER — Encounter: Payer: Self-pay | Admitting: Internal Medicine

## 2016-01-09 VITALS — BP 128/76 | HR 52 | Ht 67.0 in | Wt 215.0 lb

## 2016-01-09 DIAGNOSIS — I4891 Unspecified atrial fibrillation: Secondary | ICD-10-CM

## 2016-01-09 DIAGNOSIS — Z79899 Other long term (current) drug therapy: Secondary | ICD-10-CM

## 2016-01-09 MED ORDER — CARVEDILOL 6.25 MG PO TABS: 6.2500 mg | ORAL_TABLET | Freq: Two times a day (BID) | ORAL | 11 refills | Status: DC

## 2016-01-09 MED ORDER — FLECAINIDE ACETATE 50 MG PO TABS: ORAL_TABLET | ORAL | 11 refills | Status: DC

## 2016-01-09 NOTE — Patient Instructions (Addendum)
Medication Instructions:  Your physician has recommended you make the following change in your medication:  1) INCREASE Flecainide to 50-100 mg daily as needed for palpitations 2) Weak off Lasix and Potassium 3) Stop Metoprolol 4) START Carvedilol 6.25 twice daily  Labwork: Today: BMP   Testing/Procedures: None Ordered   Follow-Up: Your physician wants you to follow-up in: 1 year with Dr. Lovena Le. You will receive a reminder letter in the mail two months in advance. If you don't receive a letter, please call our office to schedule the follow-up appointment.   Special Instructions:  Call your pharmacy to see which drug is the least expensive (Xarelto/Pradaxa/Savaysa). Call our office if you would like to switch from Eliquis to one of the drugs listed above.

## 2016-01-09 NOTE — Telephone Encounter (Signed)
Pt requested Flecainide and Carvedilol to be sent in to [facility]. Sent new Rx to [facility].

## 2016-01-09 NOTE — Progress Notes (Signed)
HPI Shannon [name] returns today for followup of PAF. She is a pleasant obese middle aged woman who has developed atrial fib with a RVR. She was initially placed on 100 mg of flecainide along with 50 mg of lopressor, both taken bid. She developed symptomatic bradycardia and had her dose of flecainide reduced to 50 bid. She has had occaisional episodes of recurrent atrial fib with a  RVR. When I saw her last, she was increased to 75 bid and metoprolol was reduced to 25 bid. She has improved. She notes though that her blood pressure has been high. She takes an extra 50 mg of flecainide. Finally, she thinks that the lasix she was prescribed has had no effect on her dyspnea. Allergies  Allergen Reactions  . Ace Inhibitors Cough  . Hctz [Hydrochlorothiazide] Other (See Comments)    Caused drug-induced LUPUS  . Other Other (See Comments)    Unknown anti-inflammatory: Caused extremely aggressive behavior (not Prednisone)  . Oxycodone Other (See Comments)    Hallucinations  . Sulfa Antibiotics Nausea And Vomiting  . Sulfur Nausea And Vomiting  . Voltaren [Diclofenac Sodium] Other (See Comments)    Hypersensitivity     Current Outpatient Prescriptions  Medication Sig Dispense Refill  . ALPRAZolam (XANAX) 0.5 MG tablet Take 1 tablet (0.5 mg total) by mouth 3 (three) times daily as needed for anxiety. 30 tablet 1  . apixaban (ELIQUIS) 5 MG TABS tablet Take 1 tablet (5 mg total) by mouth 2 (two) times daily. 60 tablet 1  . Cholecalciferol (VITAMIN D-3 PO) Take 1 capsule by mouth at bedtime.    . DULoxetine (CYMBALTA) 30 MG capsule Take 1 capsule (30 mg total) by mouth daily. 30 capsule 5  . ergocalciferol (VITAMIN D2) 50000 units capsule Take 1 capsule (50,000 Units total) by mouth every 30 (thirty) days. 4 capsule 3  . flecainide (TAMBOCOR) 50 MG tablet Take 1.5 tablets (75 mg total) by mouth 2 (two) times daily. 90 tablet 6  . furosemide (LASIX) 40 MG tablet Take 1 tablet (40 mg total) by  mouth daily. 30 tablet 6  . HYDROcodone-acetaminophen (NORCO/VICODIN) 5-325 MG tablet Take 1 tablet by mouth every 8 (eight) hours as needed for severe pain. 15 tablet 0  . metoprolol tartrate (LOPRESSOR) 25 MG tablet Take 1 tablet (25 mg total) by mouth 2 (two) times daily. 60 tablet 6  . potassium chloride SA (KLOR-CON M20) 20 MEQ tablet Take 1 tablet (20 mEq total) by mouth daily. 30 tablet 6   No current facility-administered medications for this visit.      Past Medical History:  Diagnosis Date  . Anxiety   . Arthritis    neck and back  . Degenerative disorder of bone   . Depression   . Diastolic dysfunction   . GERD (gastroesophageal reflux disease)   . Herniated disc, cervical   . Hyperlipidemia   . Hypertension   . Lupus    HCTZ induced  . 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 Obstructive sleep apnea   . Orthostatic hypotension   . Osteopenia   . PAF (paroxysmal atrial fibrillation) ([place])   . Pinched nerve in neck   . T12 compression fracture ([place]) 11/2015  . Vitamin D deficiency   . Whiplash injury 06/07/2010    ROS:   All systems reviewed and negative except as noted in the HPI.   Past Surgical History:  Procedure  Laterality Date  . APPENDECTOMY    . arm surgery Left      Family History  Problem Relation Age of Onset  . Cancer Mother   . Stroke Father   . Hypertension Father   . Diabetes Neg Hx      Social History   Social History  . Marital status: Married    Spouse name: N/A  . Number of children: N/A  . Years of education: N/A   Occupational History  . Not on file.   Social History Main Topics  . Smoking status: Former Smoker    Types: Cigarettes    Quit date: 2015  . Smokeless tobacco: Former Systems developer     Comment: Pt started to use vaporizer-uses rarely  . Alcohol use 0.6 oz/week    1 Glasses of wine per week     Comment: social or occationally  . Drug use: No  . Sexual activity:  Yes   Other Topics Concern  . Not on file   Social History Narrative   ** Merged History Encounter **         BP 128/76   Pulse (!) 52   Ht 5\' 7"  (1.702 m)   Wt 215 lb (97.5 kg)   BMI 33.67 kg/m   Physical Exam:  obese appearing NAD HEENT: Unremarkable Neck:  6 cm JVD, no thyromegally Lymphatics:  No adenopathy Back:  No CVA tenderness Lungs:  Clear with no wheezes HEART:  IRegular rate rhythm, no murmurs, no rubs, no clicks Abd:  soft, positive bowel sounds, no organomegally, no rebound, no guarding Ext:  2 plus pulses, no edema, no cyanosis, no clubbing Skin:  No rashes no nodules Neuro:  CN II through XII intact, motor grossly intact  ECG - nsr   Assess/Plan: 1. PAF - we discussed the treatment options in detail. I have recommended she continue her flecainide to 75 bid. She will discontinue lopressor switching to coreg. 2. HTN - her blood pressure is now not well controlled. We will start coreg 3. Sinus node dysfunction - she became quite bradycardic with 100 bid of flecainide but appears to be ok on 75 mg bid.  4. Coags - she is tolerating her medications nicely.  Mikle Bosworth.D.

## 2016-01-10 LAB — BASIC METABOLIC PANEL
BUN/Creatinine Ratio: 11 — ABNORMAL LOW (ref 12–28)
BUN: 9 mg/dL (ref 8–27)
CALCIUM: 9.7 mg/dL (ref 8.7–10.3)
CO2: 27 mmol/L (ref 18–29)
CREATININE: 0.81 mg/dL (ref 0.57–1.00)
Chloride: 98 mmol/L (ref 96–106)
GFR calc Af Amer: 89 mL/min/{1.73_m2} (ref >59)
GFR, EST NON AFRICAN AMERICAN: 77 mL/min/{1.73_m2} (ref >59)
Glucose: 93 mg/dL (ref 65–99)
Potassium: 4.2 mmol/L (ref 3.5–5.2)
Sodium: 142 mmol/L (ref 134–144)

## 2016-01-11 ENCOUNTER — Encounter: Payer: Self-pay | Admitting: *Deleted

## 2016-01-14 ENCOUNTER — Telehealth: Payer: Self-pay

## 2016-01-14 NOTE — Telephone Encounter (Signed)
Called, unable to reach pt. Left voice message to see how pt is doing. Following up from pt's ov last week. Requested called back.

## 2016-01-15 NOTE — Telephone Encounter (Signed)
Received incoming call from pt. Pt stated systolic BPs have been [id number]. Pt experiencing a little SOB with exertion. Pt is taking Lasix 20 mg twice a day and potassium 1/2 tab (10 mEq) daily. Pt stated she would like to switch from Eliquis to to Xarelto. Will forward to Dr. Lovena Le to advise.

## 2016-01-15 NOTE — Telephone Encounter (Signed)
Ok to switch to Xarelto 20 mg daily with evening meal. GT

## 2016-01-16 ENCOUNTER — Telehealth: Payer: Self-pay

## 2016-01-16 MED ORDER — FLECAINIDE ACETATE 50 MG PO TABS: 75.0000 mg | ORAL_TABLET | Freq: Two times a day (BID) | ORAL | 3 refills | Status: DC

## 2016-01-16 NOTE — Telephone Encounter (Addendum)
Called pt. Informed Dr. Lovena Le stated pt may switch from Elquis to Xarelto 20 mg daily. Pt stated she just got Rx of Eliquis filled in 121/18/17. Pt would like to wait to switch when Rx for Eliquis is complete. Informed we will wait to switch the medication until 02/24/16.  Pt gave update on how BP/HR/SOB has been. Pt stated BP at 10:00 AM today was 144/110, HR 121. Pt stated she has been taking Flecainide 75 mg twice a day. Pt stated she took extra 100 mg at 2:30 AM due to increased HR/palpitations. Pt taking all medications as prescribed. Pt taking Lasix 20 mg daily and potassium 1/2 tab (10 mEq) daily (pt was instructed at last vist to wean off both medications).  Informed pt to keep taking daily BPs and HR.  Pt has a hx of HR fluctuating from upper 50s to > 100. Informed pt to call our office if HR sustains above 110s. Pt stated SOB is a little worse but not "bad". Pt stated SOB is worse when in cold weather. Informed pt to maintain a low sodium diet. Informed I will forward information to Dr. Lovena Le to advise.

## 2016-01-20 ENCOUNTER — Encounter: Payer: Self-pay | Admitting: Internal Medicine

## 2016-01-21 MED ORDER — CARVEDILOL 12.5 MG PO TABS: 12.5000 mg | ORAL_TABLET | Freq: Two times a day (BID) | ORAL | 1 refills | Status: DC

## 2016-01-21 NOTE — Telephone Encounter (Signed)
I spoke with patient. Patient states BP continues to be high: BP in the 160s-170s Recent BP readings:  01/20/16 172/85 pulse 68   01/19/16 158/88 pulse 66-after medication 01/19/16 174/93 pulse 68 -after medication 01/19/16 189/108 pulse 80-before medication 01/19/16 195/78 pulse 78-before medication  Pt states 01/09/16 metoprolol 25mg  bid was changed to coreg 6.25mg    Pt states she does have headache and blurred vision when BP high. Pt states last lasix dose was on Wed 01/16/16-pt states she had a third kind of back pain while on lasix that resolved after she stopped lasix. She wanted Dr Lovena Le to know that she felt like the third kind of back pain was associated with lasix.   01/21/16--pt states BP this AM 173/92, pulse 64  I reviewed with Dr Lillard Anes Dr Curt Bears increase coreg to 12.5mg  bid restart amlodipine 5mg  daily.

## 2016-01-25 NOTE — Telephone Encounter (Signed)
°  New Prob   Requesting a new prescription for Xarelto. Pt states she uses [facility] on [address].  Pt also has some questions regarding taking a generic multi vitamin.  Please call.

## 2016-01-29 ENCOUNTER — Ambulatory Visit: Payer: Self-pay | Admitting: Internal Medicine

## 2016-01-29 MED ORDER — RIVAROXABAN 20 MG PO TABS: 20.0000 mg | ORAL_TABLET | Freq: Every day | ORAL | 11 refills | Status: DC

## 2016-01-29 NOTE — Telephone Encounter (Signed)
Called, pt unavailable. Left voice message to cal lback. D/c'd Eliquis.  Sent in new Rx for Xarelto 20 mg daily to [facility].

## 2016-01-29 NOTE — Telephone Encounter (Signed)
New Message   Per pt husband has been waiting on a call from [place] regarding his wife & her new prescription for Xerelto.

## 2016-01-30 ENCOUNTER — Telehealth: Payer: Self-pay | Admitting: Internal Medicine

## 2016-01-30 NOTE — Telephone Encounter (Signed)
Pt called to request refill, said she already requested it and not at pharmacy, I told her per [place] it was called into [facility] 01-29-16 at 607pm.

## 2016-02-16 ENCOUNTER — Encounter: Payer: Self-pay | Admitting: Physician Assistant

## 2016-02-29 ENCOUNTER — Encounter: Payer: Self-pay | Admitting: Physician Assistant

## 2016-02-29 ENCOUNTER — Ambulatory Visit: Payer: PRIVATE HEALTH INSURANCE | Admitting: Physician Assistant

## 2016-02-29 ENCOUNTER — Ambulatory Visit (INDEPENDENT_AMBULATORY_CARE_PROVIDER_SITE_OTHER): Payer: BLUE CROSS/BLUE SHIELD | Admitting: Physician Assistant

## 2016-02-29 VITALS — BP 130/70 | HR 68 | Temp 98.8°F | Resp 14 | Ht 67.0 in | Wt 218.0 lb

## 2016-02-29 DIAGNOSIS — F418 Other specified anxiety disorders: Secondary | ICD-10-CM

## 2016-02-29 DIAGNOSIS — F419 Anxiety disorder, unspecified: Secondary | ICD-10-CM

## 2016-02-29 DIAGNOSIS — I10 Essential (primary) hypertension: Secondary | ICD-10-CM

## 2016-02-29 DIAGNOSIS — S22000S Wedge compression fracture of unspecified thoracic vertebra, sequela: Secondary | ICD-10-CM

## 2016-02-29 DIAGNOSIS — F329 Major depressive disorder, single episode, unspecified: Secondary | ICD-10-CM

## 2016-02-29 NOTE — Progress Notes (Signed)
Patient presents to clinic today for follow-up of hypertension, hyperlipidemia and thoracic vertebral fracture. Patient is currently on a regimen of Carvedilol 12.5 mg BID, Amlodipine 5 mg daily and Flecainide 50 mg daily. Is followed by Cardiology for atrial fibrillation. Has FU scheduled with specialist. Patient denies chest pain, palpitations, lightheadedness, dizziness, vision changes or frequent headaches.   BP Readings from Last 3 Encounters:  02/29/16 130/70  01/09/16 128/76  12/28/15 (!) 142/72    Patient also suffered a thoracic fracture at the end of last year. Endorses doing well overall. Has had resolution of pain in this area. Has noted some right-sided low back pain over the past week. Denies trauma or injury to the area. Denies radiation of pain into lower extremities. Denies saddle anesthesia. Denies numbness, tingling of extremities. Denies change to bowel/bladder habits.   Past Medical History:  Diagnosis Date  . Anxiety   . Arthritis    neck and back  . Degenerative disorder of bone   . Depression   . Diastolic dysfunction   . GERD (gastroesophageal reflux disease)   . Herniated disc, cervical   . Hyperlipidemia   . Hypertension   . Lupus    HCTZ induced  . 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 Obstructive sleep apnea   . Orthostatic hypotension   . Osteopenia   . PAF (paroxysmal atrial fibrillation) ([place])   . Pinched nerve in neck   . T12 compression fracture ([place]) 11/2015  . Vitamin D deficiency   . Whiplash injury 06/07/2010    Current Outpatient Prescriptions on File Prior to Visit  Medication Sig Dispense Refill  . ALPRAZolam (XANAX) 0.5 MG tablet Take 1 tablet (0.5 mg total) by mouth 3 (three) times daily as needed for anxiety. 30 tablet 1  . amLODipine (NORVASC) 5 MG tablet Take 1 tablet (5 mg total) by mouth daily.    . carvedilol (COREG) 12.5 MG tablet Take 1 tablet (12.5 mg total) by mouth 2  (two) times daily. 180 tablet 1  . Cholecalciferol (VITAMIN D-3 PO) Take 1 capsule by mouth at bedtime.    . DULoxetine (CYMBALTA) 30 MG capsule Take 1 capsule (30 mg total) by mouth daily. 30 capsule 5  . ergocalciferol (VITAMIN D2) 50000 units capsule Take 1 capsule (50,000 Units total) by mouth every 30 (thirty) days. 4 capsule 3  . flecainide (TAMBOCOR) 50 MG tablet Take 1.5 tablets (75 mg total) by mouth 2 (two) times daily. May take additional 1-2 tablets (50-100 mg) daily as needed for palpitations 120 tablet 3  . rivaroxaban (XARELTO) 20 MG TABS tablet Take 1 tablet (20 mg total) by mouth daily with supper. 30 tablet 11   No current facility-administered medications on file prior to visit.     Allergies  Allergen Reactions  . Ace Inhibitors Cough  . Hctz [Hydrochlorothiazide] Other (See Comments)    Caused drug-induced LUPUS  . Other Other (See Comments)    Unknown anti-inflammatory: Caused extremely aggressive behavior (not Prednisone)  . Oxycodone Other (See Comments)    Hallucinations  . Sulfa Antibiotics Nausea And Vomiting  . Sulfur Nausea And Vomiting  . Voltaren [Diclofenac Sodium] Other (See Comments)    Hypersensitivity    Family History  Problem Relation Age of Onset  . Cancer Mother   . Stroke Father   . Hypertension Father   . Hypertension Maternal Grandmother   . Diabetes Neg Hx  Social History   Social History  . Marital status: Married    Spouse name: N/A  . Number of children: N/A  . Years of education: N/A   Social History Main Topics  . Smoking status: Former Smoker    Types: Cigarettes    Quit date: 2015  . Smokeless tobacco: Former Systems developer     Comment: Pt started to use vaporizer-uses rarely  . Alcohol use 0.6 oz/week    1 Glasses of wine per week     Comment: social or occationally  . Drug use: No  . Sexual activity: Yes   Other Topics Concern  . None   Social History Narrative   ** Merged History Encounter **       Review of  Systems - See HPI.  All other ROS are negative.  BP 130/70   Pulse 68   Temp 98.8 F (37.1 C) (Oral)   Resp 14   Ht '5\' 7"'  (1.702 m)   Wt 218 lb (98.9 kg)   SpO2 97%   BMI 34.14 kg/m   Physical Exam  Constitutional: She is oriented to person, place, and time and well-developed, well-nourished, and in no distress.  HENT:  Head: Normocephalic and atraumatic.  Eyes: Conjunctivae are normal.  Neck: Neck supple.  Cardiovascular: Normal rate, regular rhythm and normal heart sounds.   Pulmonary/Chest: Effort normal and breath sounds normal. No respiratory distress. She has no wheezes. She has no rales. She exhibits no tenderness.  Musculoskeletal:       Cervical back: Normal.       Thoracic back: Normal.       Lumbar back: She exhibits pain. She exhibits normal range of motion, no tenderness, no bony tenderness and no spasm.  Neurological: She is alert and oriented to person, place, and time.  Skin: Skin is warm and dry. No rash noted.  Psychiatric: Affect normal.  Vitals reviewed.   Recent Results (from the past 2160 hour(s))  CBC     Status: Abnormal   Collection Time: 12/04/15 11:25 AM  Result Value Ref Range   WBC 8.1 4.0 - 10.5 K/uL   RBC 3.55 (L) 3.87 - 5.11 Mil/uL   Platelets 393.0 150.0 - 400.0 K/uL   Hemoglobin 10.4 (L) 12.0 - 15.0 g/dL   HCT 31.4 (L) 36.0 - 46.0 %   MCV 88.6 78.0 - 100.0 fl   MCHC 33.1 30.0 - 36.0 g/dL   RDW 14.4 11.5 - 56.3 %  Basic metabolic panel     Status: Abnormal   Collection Time: 12/04/15 11:25 AM  Result Value Ref Range   Sodium 138 135 - 145 mEq/L   Potassium 4.0 3.5 - 5.1 mEq/L   Chloride 103 96 - 112 mEq/L   CO2 27 19 - 32 mEq/L   Glucose, Bld 118 (H) 70 - 99 mg/dL   BUN 10 6 - 23 mg/dL   Creatinine, Ser 0.78 0.40 - 1.20 mg/dL   Calcium 9.4 8.4 - 10.5 mg/dL   GFR 78.91 >60.00 mL/min  Basic metabolic panel     Status: Abnormal   Collection Time: 12/07/15 11:40 PM  Result Value Ref Range   Sodium 138 135 - 145 mmol/L   Potassium  3.3 (L) 3.5 - 5.1 mmol/L   Chloride 103 101 - 111 mmol/L   CO2 24 22 - 32 mmol/L   Glucose, Bld 129 (H) 65 - 99 mg/dL   BUN 9 6 - 20 mg/dL   Creatinine, Ser 0.75 0.44 -  1.00 mg/dL   Calcium 9.4 8.9 - 10.3 mg/dL   GFR calc non Af Amer >60 >60 mL/min   GFR calc Af Amer >60 >60 mL/min    Comment: (NOTE) The eGFR has been calculated using the CKD EPI equation. This calculation has not been validated in all clinical situations. eGFR's persistently <60 mL/min signify possible Chronic Kidney Disease.    Anion gap 11 5 - 15  CBC     Status: Abnormal   Collection Time: 12/07/15 11:40 PM  Result Value Ref Range   WBC 10.6 (H) 4.0 - 10.5 K/uL   RBC 4.19 3.87 - 5.11 MIL/uL   Hemoglobin 12.3 12.0 - 15.0 g/dL   HCT 37.2 36.0 - 46.0 %   MCV 88.8 78.0 - 100.0 fL   MCH 29.4 26.0 - 34.0 pg   MCHC 33.1 30.0 - 36.0 g/dL   RDW 13.4 11.5 - 15.5 %   Platelets 414 (H) 150 - 400 K/uL  I-stat troponin, ED     Status: None   Collection Time: 12/07/15 11:52 PM  Result Value Ref Range   Troponin i, poc 0.03 0.00 - 0.08 ng/mL   Comment 3            Comment: Due to the release kinetics of cTnI, a negative result within the first hours of the onset of symptoms does not rule out myocardial infarction with certainty. If myocardial infarction is still suspected, repeat the test at appropriate intervals.   Protime-INR     Status: None   Collection Time: 12/08/15  2:52 AM  Result Value Ref Range   Prothrombin Time 15.1 11.4 - 15.2 seconds   INR 1.18   Troponin I (q 6hr x 3)     Status: Abnormal   Collection Time: 12/08/15  2:52 AM  Result Value Ref Range   Troponin I 0.16 (HH) <0.03 ng/mL    Comment: CRITICAL RESULT CALLED TO, READ BACK BY AND VERIFIED WITH: GLOUSTER,J RN 12/08/2015 0404 JORDANS   TSH     Status: None   Collection Time: 12/08/15  2:55 AM  Result Value Ref Range   TSH 1.002 0.350 - 4.500 uIU/mL    Comment: Performed by a 3rd Generation assay with a functional sensitivity of <=0.01  uIU/mL.  Brain natriuretic peptide     Status: Abnormal   Collection Time: 12/08/15  2:57 AM  Result Value Ref Range   B Natriuretic Peptide 341.2 (H) 0.0 - 100.0 pg/mL  Hemoglobin A1c     Status: None   Collection Time: 12/08/15  3:05 AM  Result Value Ref Range   Hgb A1c MFr Bld 5.5 4.8 - 5.6 %    Comment: (NOTE)         Pre-diabetes: 5.7 - 6.4         Diabetes: >6.4         Glycemic control for adults with diabetes: <7.0    Mean Plasma Glucose 111 mg/dL    Comment: (NOTE) Performed At: [facility] [place], [place] [id number] Lindon Romp MD [id number]   Lipid panel     Status: Abnormal   Collection Time: 12/08/15  3:05 AM  Result Value Ref Range   Cholesterol 189 0 - 200 mg/dL   Triglycerides 116 <150 mg/dL   HDL 48 >40 mg/dL   Total CHOL/HDL Ratio 3.9 RATIO   VLDL 23 0 - 40 mg/dL   LDL Cholesterol 118 (H) 0 - 99 mg/dL    Comment:  Total Cholesterol/HDL:CHD Risk Coronary Heart Disease Risk Table                     Men   Women  1/2 Average Risk   3.4   3.3  Average Risk       5.0   4.4  2 X Average Risk   9.6   7.1  3 X Average Risk  23.4   11.0        Use the calculated Patient Ratio above and the CHD Risk Table to determine the patient's CHD Risk.        ATP III CLASSIFICATION (LDL):  <100     mg/dL   Optimal  100-129  mg/dL   Near or Above                    Optimal  130-159  mg/dL   Borderline  160-189  mg/dL   High  >190     mg/dL   Very High   Troponin I (q 6hr x 3)     Status: Abnormal   Collection Time: 12/08/15  9:45 AM  Result Value Ref Range   Troponin I 0.06 (HH) <0.03 ng/mL    Comment: CRITICAL VALUE NOTED.  VALUE IS CONSISTENT WITH PREVIOUSLY REPORTED AND CALLED VALUE.  Basic metabolic panel     Status: Abnormal   Collection Time: 12/08/15  9:45 AM  Result Value Ref Range   Sodium 138 135 - 145 mmol/L   Potassium 3.4 (L) 3.5 - 5.1 mmol/L   Chloride 102 101 - 111 mmol/L   CO2 25 22 - 32 mmol/L   Glucose,  Bld 116 (H) 65 - 99 mg/dL   BUN 6 6 - 20 mg/dL   Creatinine, Ser 0.70 0.44 - 1.00 mg/dL   Calcium 9.5 8.9 - 10.3 mg/dL   GFR calc non Af Amer >60 >60 mL/min   GFR calc Af Amer >60 >60 mL/min    Comment: (NOTE) The eGFR has been calculated using the CKD EPI equation. This calculation has not been validated in all clinical situations. eGFR's persistently <60 mL/min signify possible Chronic Kidney Disease.    Anion gap 11 5 - 15  Magnesium     Status: None   Collection Time: 12/08/15  9:45 AM  Result Value Ref Range   Magnesium 2.1 1.7 - 2.4 mg/dL  Troponin I (q 6hr x 3)     Status: Abnormal   Collection Time: 12/08/15  3:39 PM  Result Value Ref Range   Troponin I 0.06 (HH) <0.03 ng/mL    Comment: CRITICAL VALUE NOTED.  VALUE IS CONSISTENT WITH PREVIOUSLY REPORTED AND CALLED VALUE.  Comp Met (CMET)     Status: None   Collection Time: 12/11/15  2:49 PM  Result Value Ref Range   Sodium 138 135 - 145 mEq/L   Potassium 4.0 3.5 - 5.1 mEq/L   Chloride 103 96 - 112 mEq/L   CO2 28 19 - 32 mEq/L   Glucose, Bld 98 70 - 99 mg/dL   BUN 15 6 - 23 mg/dL   Creatinine, Ser 0.90 0.40 - 1.20 mg/dL   Total Bilirubin 0.4 0.2 - 1.2 mg/dL   Alkaline Phosphatase 108 39 - 117 U/L   AST 11 0 - 37 U/L   ALT 12 0 - 35 U/L   Total Protein 6.7 6.0 - 8.3 g/dL   Albumin 4.0 3.5 - 5.2 g/dL   Calcium 9.4 8.4 - 10.5  mg/dL   GFR 66.89 >60.00 mL/min  Basic metabolic panel     Status: Abnormal   Collection Time: 01/09/16 11:55 AM  Result Value Ref Range   Glucose 93 65 - 99 mg/dL   BUN 9 8 - 27 mg/dL   Creatinine, Ser 0.81 0.57 - 1.00 mg/dL   GFR calc non Af Amer 77 >59 mL/min/1.73   GFR calc Af Amer 89 >59 mL/min/1.73   BUN/Creatinine Ratio 11 (L) 12 - 28   Sodium 142 134 - 144 mmol/L   Potassium 4.2 3.5 - 5.2 mmol/L   Chloride 98 96 - 106 mmol/L   CO2 27 18 - 29 mmol/L   Calcium 9.7 8.7 - 10.3 mg/dL    Assessment/Plan: Essential hypertension BP stable. Asymptomatic. Continue current regimen. FU  with specialists as scheduled.   Thoracic compression fracture [place]) Doing very well. Thoracic pain has completely resolved. Is having mild lumbosacral pain without radicular symptoms. Exam unremarkable. Will consider restarting physical therapy. Discussed OTC pain relief. Stretching exercises reviewed. Again if not resolving, will need PT.  Anxiety and depression Doing well. Continue current regimen.    Leeanne Rio, PA-C

## 2016-02-29 NOTE — Progress Notes (Signed)
Pre visit review using our clinic review tool, if applicable. No additional management support is needed unless otherwise documented below in the visit note. 

## 2016-02-29 NOTE — Patient Instructions (Signed)
Please take Tylenol Arthritis as directed. Apply topical Icy Hot or Aspercreme to the area. Limit heavy lifting or overexertion. Please consider repeat physical therapy assessment.  Please continue chronic medications as directed. Send me a message if pain is not improving over the weekend.

## 2016-03-02 NOTE — Assessment & Plan Note (Signed)
Doing well. Continue current regimen.  

## 2016-03-02 NOTE — Assessment & Plan Note (Signed)
Doing very well. Thoracic pain has completely resolved. Is having mild lumbosacral pain without radicular symptoms. Exam unremarkable. Will consider restarting physical therapy. Discussed OTC pain relief. Stretching exercises reviewed. Again if not resolving, will need PT.

## 2016-03-02 NOTE — Assessment & Plan Note (Signed)
BP stable. Asymptomatic. Continue current regimen. FU with specialists as scheduled.

## 2016-03-04 ENCOUNTER — Encounter: Payer: Self-pay | Admitting: Physician Assistant

## 2016-03-04 ENCOUNTER — Ambulatory Visit: Payer: Self-pay | Admitting: Cardiovascular Disease

## 2016-03-06 ENCOUNTER — Encounter: Payer: Self-pay | Admitting: Physician Assistant

## 2016-03-20 ENCOUNTER — Observation Stay (HOSPITAL_COMMUNITY)
Admission: EM | Admit: 2016-03-20 | Discharge: 2016-03-21 | Disposition: A | Discharge status: Home / Self Care | Payer: BLUE CROSS/BLUE SHIELD | Attending: Internal Medicine | Admitting: Internal Medicine

## 2016-03-20 ENCOUNTER — Emergency Department (HOSPITAL_COMMUNITY): Payer: BLUE CROSS/BLUE SHIELD

## 2016-03-20 ENCOUNTER — Encounter (HOSPITAL_COMMUNITY): Payer: Self-pay

## 2016-03-20 ENCOUNTER — Telehealth: Payer: Self-pay | Admitting: Physician Assistant

## 2016-03-20 DIAGNOSIS — G709 Myoneural disorder, unspecified: Secondary | ICD-10-CM | POA: Insufficient documentation

## 2016-03-20 DIAGNOSIS — M898X9 Other specified disorders of bone, unspecified site: Secondary | ICD-10-CM | POA: Insufficient documentation

## 2016-03-20 DIAGNOSIS — Z886 Allergy status to analgesic agent status: Secondary | ICD-10-CM | POA: Insufficient documentation

## 2016-03-20 DIAGNOSIS — Z882 Allergy status to sulfonamides status: Secondary | ICD-10-CM | POA: Insufficient documentation

## 2016-03-20 DIAGNOSIS — I480 Paroxysmal atrial fibrillation: Secondary | ICD-10-CM

## 2016-03-20 DIAGNOSIS — Z7901 Long term (current) use of anticoagulants: Secondary | ICD-10-CM | POA: Insufficient documentation

## 2016-03-20 DIAGNOSIS — M320 Drug-induced systemic lupus erythematosus: Secondary | ICD-10-CM | POA: Insufficient documentation

## 2016-03-20 DIAGNOSIS — M8580 Other specified disorders of bone density and structure, unspecified site: Secondary | ICD-10-CM | POA: Insufficient documentation

## 2016-03-20 DIAGNOSIS — Z87891 Personal history of nicotine dependence: Secondary | ICD-10-CM | POA: Diagnosis not present

## 2016-03-20 DIAGNOSIS — Z885 Allergy status to narcotic agent status: Secondary | ICD-10-CM | POA: Insufficient documentation

## 2016-03-20 DIAGNOSIS — I4581 Long QT syndrome: Secondary | ICD-10-CM | POA: Insufficient documentation

## 2016-03-20 DIAGNOSIS — I4891 Unspecified atrial fibrillation: Secondary | ICD-10-CM | POA: Diagnosis present

## 2016-03-20 DIAGNOSIS — M4692 Unspecified inflammatory spondylopathy, cervical region: Secondary | ICD-10-CM | POA: Insufficient documentation

## 2016-03-20 DIAGNOSIS — I5032 Chronic diastolic (congestive) heart failure: Secondary | ICD-10-CM | POA: Diagnosis not present

## 2016-03-20 DIAGNOSIS — I110 Hypertensive heart disease with heart failure: Secondary | ICD-10-CM | POA: Insufficient documentation

## 2016-03-20 DIAGNOSIS — E785 Hyperlipidemia, unspecified: Secondary | ICD-10-CM | POA: Insufficient documentation

## 2016-03-20 DIAGNOSIS — Z823 Family history of stroke: Secondary | ICD-10-CM | POA: Insufficient documentation

## 2016-03-20 DIAGNOSIS — E559 Vitamin D deficiency, unspecified: Secondary | ICD-10-CM | POA: Insufficient documentation

## 2016-03-20 DIAGNOSIS — I4892 Unspecified atrial flutter: Secondary | ICD-10-CM | POA: Insufficient documentation

## 2016-03-20 DIAGNOSIS — E669 Obesity, unspecified: Secondary | ICD-10-CM | POA: Diagnosis not present

## 2016-03-20 DIAGNOSIS — I700 Atherosclerosis of aorta: Secondary | ICD-10-CM | POA: Diagnosis not present

## 2016-03-20 DIAGNOSIS — Z8249 Family history of ischemic heart disease and other diseases of the circulatory system: Secondary | ICD-10-CM | POA: Insufficient documentation

## 2016-03-20 DIAGNOSIS — I481 Persistent atrial fibrillation: Principal | ICD-10-CM | POA: Insufficient documentation

## 2016-03-20 DIAGNOSIS — Z809 Family history of malignant neoplasm, unspecified: Secondary | ICD-10-CM | POA: Insufficient documentation

## 2016-03-20 DIAGNOSIS — Z6833 Body mass index (BMI) 33.0-33.9, adult: Secondary | ICD-10-CM | POA: Diagnosis not present

## 2016-03-20 DIAGNOSIS — I10 Essential (primary) hypertension: Secondary | ICD-10-CM | POA: Diagnosis not present

## 2016-03-20 DIAGNOSIS — R748 Abnormal levels of other serum enzymes: Secondary | ICD-10-CM | POA: Diagnosis not present

## 2016-03-20 DIAGNOSIS — I471 Supraventricular tachycardia: Secondary | ICD-10-CM | POA: Diagnosis not present

## 2016-03-20 DIAGNOSIS — Z79899 Other long term (current) drug therapy: Secondary | ICD-10-CM | POA: Diagnosis not present

## 2016-03-20 DIAGNOSIS — G4733 Obstructive sleep apnea (adult) (pediatric): Secondary | ICD-10-CM | POA: Diagnosis present

## 2016-03-20 DIAGNOSIS — F329 Major depressive disorder, single episode, unspecified: Secondary | ICD-10-CM | POA: Diagnosis not present

## 2016-03-20 DIAGNOSIS — Z9889 Other specified postprocedural states: Secondary | ICD-10-CM | POA: Diagnosis not present

## 2016-03-20 DIAGNOSIS — M5020 Other cervical disc displacement, unspecified cervical region: Secondary | ICD-10-CM | POA: Insufficient documentation

## 2016-03-20 DIAGNOSIS — F419 Anxiety disorder, unspecified: Secondary | ICD-10-CM | POA: Diagnosis not present

## 2016-03-20 LAB — CBC WITH DIFFERENTIAL/PLATELET
BASOS ABS: 0 10*3/uL (ref 0.0–0.1)
Basophils Relative: 0 %
EOS ABS: 0.2 10*3/uL (ref 0.0–0.7)
Eosinophils Relative: 2 %
HCT: 41.3 % (ref 36.0–46.0)
HEMOGLOBIN: 13.4 g/dL (ref 12.0–15.0)
LYMPHS ABS: 1.7 10*3/uL (ref 0.7–4.0)
Lymphocytes Relative: 17 %
MCH: 28.3 pg (ref 26.0–34.0)
MCHC: 32.4 g/dL (ref 30.0–36.0)
MCV: 87.3 fL (ref 78.0–100.0)
Monocytes Absolute: 0.7 10*3/uL (ref 0.1–1.0)
Monocytes Relative: 7 %
NEUTROS PCT: 74 %
Neutro Abs: 7.3 10*3/uL (ref 1.7–7.7)
Platelets: 299 10*3/uL (ref 150–400)
RBC: 4.73 MIL/uL (ref 3.87–5.11)
RDW: 14.6 % (ref 11.5–15.5)
WBC: 9.9 10*3/uL (ref 4.0–10.5)

## 2016-03-20 LAB — COMPREHENSIVE METABOLIC PANEL
ALK PHOS: 118 U/L (ref 38–126)
ALT: 23 U/L (ref 14–54)
ANION GAP: 9 (ref 5–15)
AST: 23 U/L (ref 15–41)
Albumin: 4.1 g/dL (ref 3.5–5.0)
BUN: 8 mg/dL (ref 6–20)
CALCIUM: 9.9 mg/dL (ref 8.9–10.3)
CHLORIDE: 105 mmol/L (ref 101–111)
CO2: 25 mmol/L (ref 22–32)
Creatinine, Ser: 0.73 mg/dL (ref 0.44–1.00)
Glucose, Bld: 101 mg/dL — ABNORMAL HIGH (ref 65–99)
Potassium: 3.9 mmol/L (ref 3.5–5.1)
SODIUM: 139 mmol/L (ref 135–145)
Total Bilirubin: 0.4 mg/dL (ref 0.3–1.2)
Total Protein: 7.8 g/dL (ref 6.5–8.1)

## 2016-03-20 LAB — PROTIME-INR
INR: 1.41
PROTHROMBIN TIME: 17.4 s — AB (ref 11.4–15.2)

## 2016-03-20 LAB — TROPONIN I
TROPONIN I: 0.05 ng/mL — AB (ref <0.03)
Troponin I: 0.04 ng/mL (ref <0.03)
Troponin I: 0.04 ng/mL (ref <0.03)

## 2016-03-20 IMAGING — CR DG CHEST 2V
2 series · 2 of 2 positions shown · non-contrast
Comparison: None.

CLINICAL DATA: Atrial fibrillation. Shortness of breath and
dizziness.

EXAM:
CHEST  2 VIEW

[chest pa]
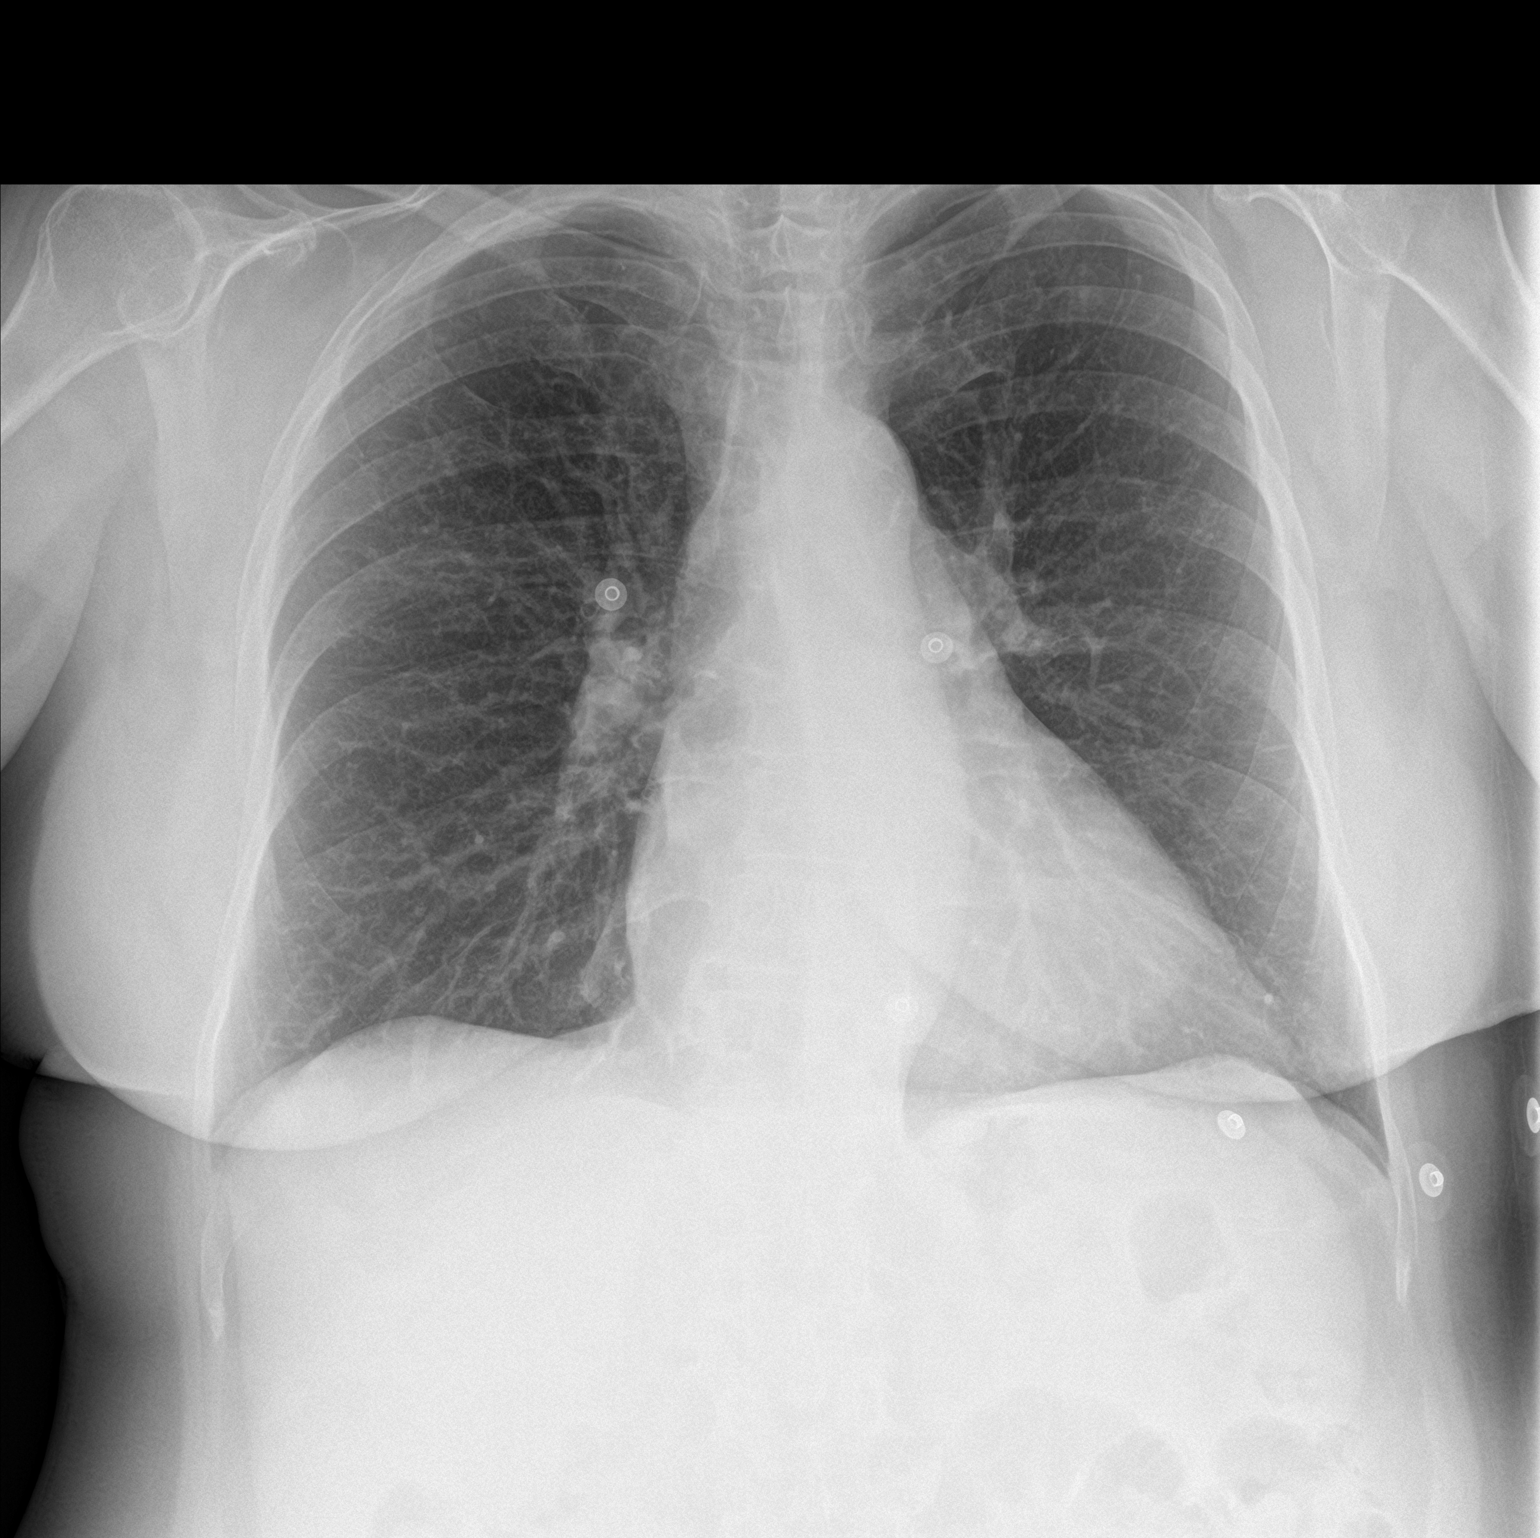

[chest lat]
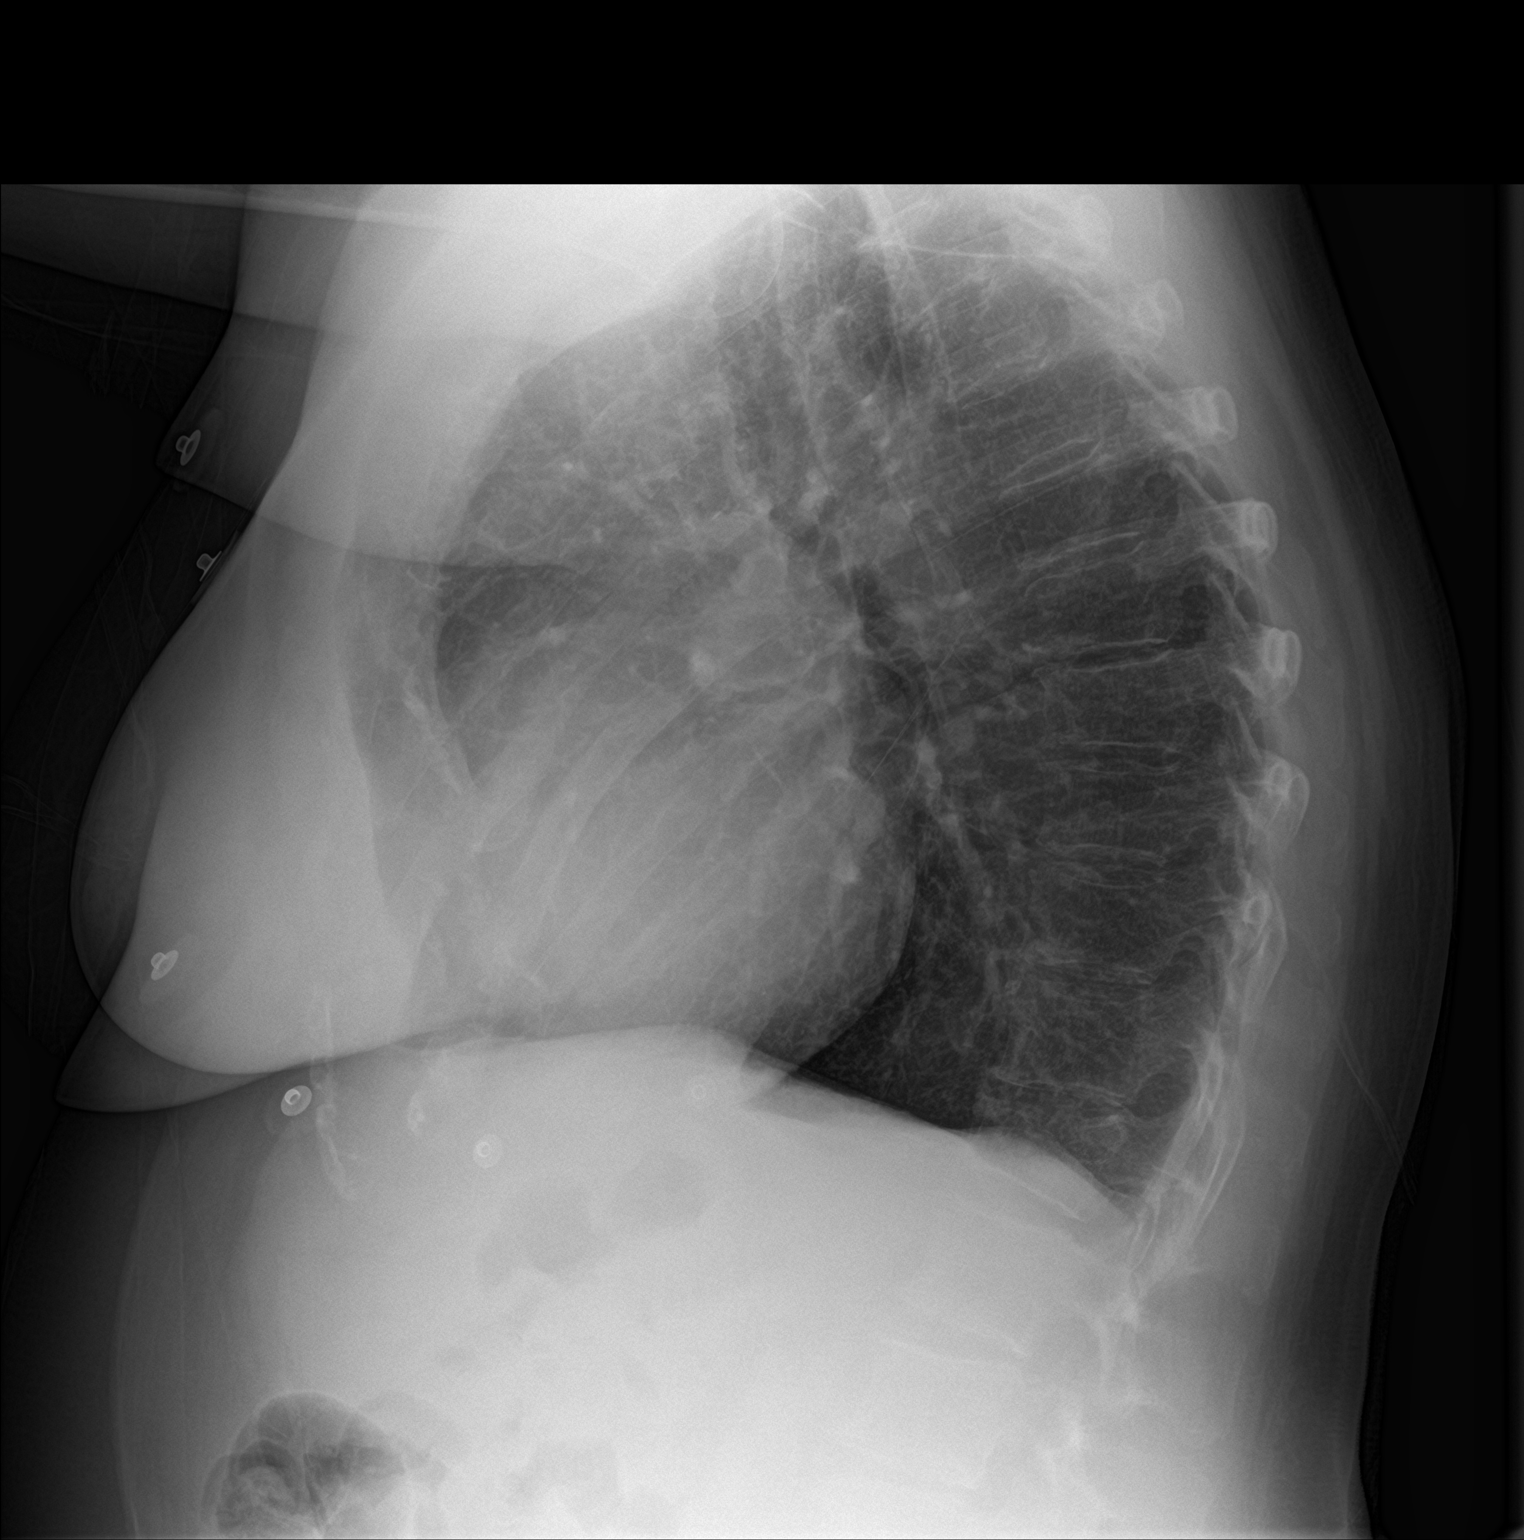

[2 of 2 positions shown; findings below may reference images not displayed]

FINDINGS: Lungs are clear. Heart is upper normal in size with pulmonary
vascularity within normal limits. No adenopathy. There is
atherosclerotic calcification in the aorta. There is anterior
wedging of the L1 vertebral body.
IMPRESSION: No edema or consolidation. Age uncertain anterior wedging of the L1
vertebral body. Aortic atherosclerosis. No adenopathy.

## 2016-03-20 MED ORDER — SODIUM CHLORIDE 0.9 % IV BOLUS (SEPSIS)
1000.0000 mL | Freq: Once | INTRAVENOUS | Status: AC
  Administered 2016-03-20: 1000 mL via INTRAVENOUS

## 2016-03-20 MED ORDER — NITROGLYCERIN 0.4 MG SL SUBL: 0.4000 mg | SUBLINGUAL_TABLET | SUBLINGUAL | Status: DC | PRN

## 2016-03-20 MED ORDER — ACETAMINOPHEN 325 MG PO TABS: 650.0000 mg | ORAL_TABLET | ORAL | Status: DC | PRN

## 2016-03-20 MED ORDER — AMLODIPINE BESYLATE 5 MG PO TABS
5.0000 mg | ORAL_TABLET | Freq: Every day | ORAL | Status: DC
  Administered 2016-03-21: 5 mg via ORAL
  Filled 2016-03-20: qty 1

## 2016-03-20 MED ORDER — DILTIAZEM HCL-DEXTROSE 100-5 MG/100ML-% IV SOLN (PREMIX)
5.0000 mg/h | INTRAVENOUS | Status: DC
  Administered 2016-03-20: 5 mg/h via INTRAVENOUS
  Filled 2016-03-20: qty 100

## 2016-03-20 MED ORDER — RIVAROXABAN 20 MG PO TABS
20.0000 mg | ORAL_TABLET | Freq: Every day | ORAL | Status: DC
  Administered 2016-03-20: 20 mg via ORAL
  Filled 2016-03-20: qty 1

## 2016-03-20 MED ORDER — ASPIRIN EC 81 MG PO TBEC
81.0000 mg | DELAYED_RELEASE_TABLET | Freq: Every day | ORAL | Status: DC
  Administered 2016-03-21: 81 mg via ORAL
  Filled 2016-03-20: qty 1

## 2016-03-20 MED ORDER — VITAMIN D (ERGOCALCIFEROL) 1.25 MG (50000 UNIT) PO CAPS
50000.0000 [IU] | ORAL_CAPSULE | ORAL | Status: DC
  Administered 2016-03-20: 50000 [IU] via ORAL
  Filled 2016-03-20: qty 1

## 2016-03-20 MED ORDER — FLECAINIDE ACETATE 50 MG PO TABS: 75.0000 mg | ORAL_TABLET | Freq: Two times a day (BID) | ORAL | Status: DC

## 2016-03-20 MED ORDER — ONDANSETRON HCL 4 MG/2ML IJ SOLN: 4.0000 mg | Freq: Four times a day (QID) | INTRAMUSCULAR | Status: DC | PRN

## 2016-03-20 MED ORDER — DULOXETINE HCL 30 MG PO CPEP
30.0000 mg | ORAL_CAPSULE | Freq: Every day | ORAL | Status: DC
  Administered 2016-03-21: 30 mg via ORAL
  Filled 2016-03-20: qty 1

## 2016-03-20 MED ORDER — SODIUM CHLORIDE 0.9% FLUSH: 3.0000 mL | INTRAVENOUS | Status: DC | PRN

## 2016-03-20 MED ORDER — SODIUM CHLORIDE 0.9% FLUSH
3.0000 mL | Freq: Two times a day (BID) | INTRAVENOUS | Status: DC
  Administered 2016-03-20 – 2016-03-21 (×2): 3 mL via INTRAVENOUS

## 2016-03-20 MED ORDER — ALPRAZOLAM 0.5 MG PO TABS: 0.5000 mg | ORAL_TABLET | Freq: Three times a day (TID) | ORAL | Status: DC | PRN

## 2016-03-20 MED ORDER — FLECAINIDE ACETATE 50 MG PO TABS
75.0000 mg | ORAL_TABLET | Freq: Two times a day (BID) | ORAL | Status: DC
  Administered 2016-03-20 – 2016-03-21 (×2): 75 mg via ORAL
  Filled 2016-03-20 (×2): qty 2

## 2016-03-20 MED ORDER — SODIUM CHLORIDE 0.9 % IV SOLN: 250.0000 mL | INTRAVENOUS | Status: DC | PRN

## 2016-03-20 MED ORDER — CARVEDILOL 12.5 MG PO TABS
12.5000 mg | ORAL_TABLET | Freq: Two times a day (BID) | ORAL | Status: DC
  Administered 2016-03-20: 12.5 mg via ORAL
  Filled 2016-03-20 (×2): qty 1

## 2016-03-20 NOTE — ED Notes (Signed)
Patient transported to X-ray 

## 2016-03-20 NOTE — H&P (Signed)
CARDIOLOGY CONSULT NOTE   Patient ID: Shannon [name] MRN: [id number] DOB/AGE: [birth year] 66 y.o.  Admit date: 03/20/2016  Requesting Physician: Dr. Amedeo Plenty (ER Attending) Primary Physician:   Leeanne Rio, PA-C Primary Cardiologist:   Dr. Lovena Le Reason for Consultation:   Afib with RVR  HPI: Shannon [name] is a 66 y.o. female with a history of paroxysmal atrial fibrillation first diagnosed on 11/24/2015, failed DCCV x 3, on Xarelto. 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   Patient has been having her afib rate managed by Dr. Lovena Le.  Flecainide 100 mg BID caused her to be bradycardic and 50 mg BID and she was still having break through tachycardia.   She has been instructed to come to the ER by Dr. Lovena Le after calling the office to tell him that She noticed last night that she has had a flutter sensation in her chest. Mild associated pressure and an episode of dizziness and near syncope x 3 with dyspnea on exertion.  The patient tries to stay away from caffeine but had a glass of tea from [employer] yesterday evening which precluded her tachycardia. She has had her daily dose of Flecainide today and yesterday. Took extra 25 mg at midnight last night and extra 25 mg, 6 am this morning  In the emergency department by EMS she has been asymptomatic with HR 100-140, she still has very mild chest pressure. Non acute chest xray, chest xray is non ischemic with afib w/rvr. BP is 129/90, Troponin is 0.05 which what it was 3 months ago when she was diagnosed with afib.      Past Medical History:  Diagnosis Date  . Anxiety   . Arthritis    neck and back  . Degenerative disorder of bone   . Depression   . Diastolic dysfunction     . GERD (gastroesophageal reflux disease)   . Herniated disc, cervical   . Hyperlipidemia   . Hypertension   . Lupus    HCTZ induced  . 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 Obstructive sleep apnea   . Orthostatic hypotension   . Osteopenia   . PAF (paroxysmal atrial fibrillation) ([place])   . Pinched nerve in neck   . T12 compression fracture ([place]) 11/2015  . Vitamin D deficiency   . Whiplash injury 06/07/2010     Past Surgical History:  Procedure Laterality Date  . APPENDECTOMY    . arm surgery Left     Allergies  Allergen Reactions  . Ace Inhibitors Cough  . Hctz [Hydrochlorothiazide] Other (See Comments)    Caused drug-induced LUPUS  . Other Other (See Comments)    Unknown anti-inflammatory: Caused extremely aggressive behavior (not Prednisone)  . Oxycodone Other (See Comments)    Hallucinations  . Sulfa Antibiotics Nausea And Vomiting  . Sulfur Nausea And Vomiting  . Voltaren [Diclofenac Sodium] Other (See Comments)    Hypersensitivity    I have reviewed the patient's current medications   Prior to Admission medications   Medication Sig Start Date End Date Taking? Authorizing Provider  acetaminophen (TYLENOL) 650 MG CR tablet Take 1,300 mg by mouth every 8 (eight) hours as needed for pain.   Yes Historical Provider,  MD  amLODipine (NORVASC) 5 MG tablet Take 1 tablet (5 mg total) by mouth daily. 01/21/16  Yes Will Meredith Leeds, MD  carvedilol (COREG) 12.5 MG tablet Take 1 tablet (12.5 mg total) by mouth 2 (two) times daily. 01/21/16 04/20/16 Yes Will Meredith Leeds, MD  Cholecalciferol (VITAMIN D-3 PO) Take 1 capsule by mouth at bedtime.   Yes Historical Provider, MD  DULoxetine (CYMBALTA) 30 MG capsule Take 1 capsule (30 mg total) by mouth daily. 09/24/15  Yes Brunetta Jeans, PA-C  ergocalciferol (VITAMIN D2) 50000 units capsule Take 1 capsule (50,000 Units total) by mouth every 30 (thirty) days. 08/29/15  Yes  Brunetta Jeans, PA-C  flecainide (TAMBOCOR) 50 MG tablet Take 1.5 tablets (75 mg total) by mouth 2 (two) times daily. May take additional 1-2 tablets (50-100 mg) daily as needed for palpitations 01/16/16  Yes Evans Lance, MD  rivaroxaban (XARELTO) 20 MG TABS tablet Take 1 tablet (20 mg total) by mouth daily with supper. 01/29/16  Yes Evans Lance, MD  ALPRAZolam Duanne Moron) 0.5 MG tablet Take 1 tablet (0.5 mg total) by mouth 3 (three) times daily as needed for anxiety. 12/24/15   Brunetta Jeans, PA-C     Social History   Social History  . Marital status: Married    Spouse name: N/A  . Number of children: N/A  . Years of education: N/A   Occupational History  . Not on file.   Social History Main Topics  . Smoking status: Former Smoker    Types: Cigarettes    Quit date: 2015  . Smokeless tobacco: Former Systems developer     Comment: Pt started to use vaporizer-uses rarely  . Alcohol use 0.6 oz/week    1 Glasses of wine per week     Comment: social or occationally  . Drug use: No  . Sexual activity: Yes   Other Topics Concern  . Not on file   Social History Narrative   ** Merged History Encounter **        Family Status  Relation Status  . Mother Deceased  . Father Deceased  . Maternal Grandmother Deceased  . Maternal Grandfather Deceased  . Paternal Grandmother Deceased  . Paternal Grandfather Deceased  . Neg Hx    Family History  Problem Relation Age of Onset  . Cancer Mother   . Stroke Father   . Hypertension Father   . Hypertension Maternal Grandmother   . Diabetes Neg Hx           ROS:  Full 14 point review of systems complete and found to be negative unless listed above.  Physical Exam: Blood pressure 131/89, pulse (!) 117, temperature 97.3 F (36.3 C), temperature source Oral, resp. rate 20, height 5\' 7"  (1.702 m), weight 213 lb (96.6 kg), SpO2 97 %.   General: Well developed, well nourished, female in no acute distress Head: No xanthomas. Normocephalic and  atraumatic, Lungs: normal effort and rate of breathing. Heart: irregularly irregular, tachycardic  Neck: No carotid bruits. No lymphadenopathy. Abdomen: Bowel sounds present, abdomen soft and non-tender Msk:  Spontaneously moves all 4 extremities Extremities: No clubbing or cyanosis.  No le edema  Neuro: Alert and oriented X 3. No focal deficits noted. Psych:  Good affect, responds appropriately Skin: No rashes or lesions noted.     Labs:   Lab Results  Component Value Date   WBC 9.9 03/20/2016   HGB 13.4 03/20/2016   HCT 41.3 03/20/2016   MCV 87.3  03/20/2016   PLT 299 03/20/2016    Recent Labs  03/20/16 1250  INR 1.41     Recent Labs Lab 03/20/16 1250  NA 139  K 3.9  CL 105  CO2 25  BUN 8  CREATININE 0.73  CALCIUM 9.9  PROT 7.8  BILITOT 0.4  ALKPHOS 118  ALT 23  AST 23  GLUCOSE 101*  ALBUMIN 4.1    Recent Labs  03/20/16 1250  TROPONINI 0.05*    Echo   11/18/2015 Study Conclusions  - Left ventricle: The cavity size was normal. There was moderate   concentric hypertrophy. Systolic function was normal. The   estimated ejection fraction was in the range of 55% to 60%. Wall   motion was normal; there were no regional wall motion   abnormalities. Left ventricular diastolic function parameters   were normal. - Left atrium: The atrium was mildly to moderately dilated. - Right atrium: The atrium was mildly dilated.  Nuclear Perfusion scan 09/2015   Study Highlights     Nuclear stress EF: 48%.  There was no ST segment deviation noted during stress.  The study is normal.  The left ventricular ejection fraction is mildly decreased (45-54%).   Normal resting and stress perfusion. No ischemia or infarction EF 48% mild diffuse hypokinesis suggest echo or MRI correlation      ECG:     Atrial fibrillation with RVR, HR 108 - personally reviewed   Radiology:  Dg Chest 2 View 03/20/2016 No edema or consolidation. Age uncertain anterior  wedging of the L1 vertebral body. Aortic atherosclerosis. No adenopathy.       ASSESSMENT AND PLAN:      Principal Problem:   Paroxysmal atrial fibrillation with RVR (HCC) Active Problems:   Essential hypertension   OSA (obstructive sleep apnea)   Chronic diastolic CHF (congestive heart failure) (HCC)  Atrial fibrillation with rvr :  --   Give Cardizem bolus and start on drip --   Patient feels like she has been doing well on Flecainide but we will discuss with Dr. Lovena Le and EP in the morning to      Discuss possible change in current dose vs change in antiarrhythmic (Tikosyn) vs potential ablation. -  plan to obtain rate control, admit and observe over night, continue home meds  Chronic diastolic CHF:  -- appears euvolemic on exam.  -- Of note pt cannot take lasix because it made her drug induced polyarthralgia return  OSA on CPAP - reports compliance  Elevated troponin - this is minimally elevated in setting of afib with RVR and recent nuclear stress test with no ischemia and normal LVF by echo. - Cycle Troponins    Signed: Linus Mako, PA-C 03/20/2016 4:40 PM

## 2016-03-20 NOTE — Telephone Encounter (Signed)
Patient's husband calling and states that his wife has been in Afib since last night. He states that her HR has been anywhere from 100-140. He states that she is lightheaded and dizzy now and that her BP is low. Her BP was 94/69 and is now 90/60. He states that she has taken her meds as directed and that she took an additional 1/2 tablet of flecainide twice last night which has not helped. He states that she is starting to feel worse. Given her symptoms the patient was advised to be seen in the ER.

## 2016-03-20 NOTE — ED Notes (Signed)
Cardiology at bedside.

## 2016-03-20 NOTE — ED Notes (Signed)
ED Provider at bedside. 

## 2016-03-20 NOTE — Telephone Encounter (Signed)
Shannon [name] states his wife has been in A Fib for most of the night--she is now having dizzy spells, and her blood pressure is low.

## 2016-03-20 NOTE — ED Provider Notes (Signed)
[facility] DEPT Provider Note   CSN: [id number] Arrival date & time: 03/20/16  1153     History   Chief Complaint Chief Complaint  Patient presents with  . Atrial Fibrillation    HPI Shannon [name] is a 66 y.o. female.    Pt is a 66 y/o F with PMH of afib (on xarelto), htn, hld, gerd, anxiety, and depression who presents to ED for heart palpitations, shortness of breath, and dizziness, onset yesterday evening, took HR ranging from 100-140s, called her cardiologist (Dr. Lovena Le) this AM and recommended taking additional 0.5 tab of flexainide, but symptoms persist. Denies CP. Reports heart palpitations started while sitting, shortness of breath is worse with exertion, dizziness with position changes; states she has experienced similar symptoms when she was previously in afib. Denies recent head trauma, fall, or LOC. States that she took her BP this AM and noted to be 42V systolic/ 95G diastolic. Denies fevers, chills, unexplained weight loss, vision or gait changes, CP, cough, hemoptysis, abd pain, n/v/d, dysuria, extremity numbness/tingling/weakness, or any additional concerns. Hx of electrical cardioversion on 12/07/15, did not convert at that time. Last echo 11/18/15- LVEF 55-60%.      Past Medical History:  Diagnosis Date  . Anxiety   . Arthritis    neck and back  . Degenerative disorder of bone   . Depression   . Diastolic dysfunction   . GERD (gastroesophageal reflux disease)   . Herniated disc, cervical   . Hyperlipidemia   . Hypertension   . Lupus    HCTZ induced  . 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 Obstructive sleep apnea   . Orthostatic hypotension   . Osteopenia   . PAF (paroxysmal atrial fibrillation) ([place])   . Pinched nerve in neck   . T12 compression fracture ([place]) 11/2015  . Vitamin D deficiency   . Whiplash injury 06/07/2010    Patient Active Problem List   Diagnosis Date Noted  .  Paroxysmal atrial fibrillation with RVR ([place]) 03/20/2016  . Chronic diastolic CHF (congestive heart failure) ([place]) 12/08/2015  . Atrial fibrillation with RVR ([place])   . Thoracic compression fracture ([place]) 11/16/2015  . Orthostatic hypotension 11/15/2015  . Osteopenia 09/12/2015  . Vitamin D deficiency 08/29/2015  . OSA (obstructive sleep apnea) 03/04/2015  . Polymyalgia ([place]) 11/22/2014  . Essential hypertension 08/21/2014  . Anxiety and depression 08/21/2014  . Hip stiffness 08/21/2014    Past Surgical History:  Procedure Laterality Date  . APPENDECTOMY    . arm surgery Left     OB History    No data available       Home Medications    Prior to Admission medications   Medication Sig Start Date End Date Taking? Authorizing Provider  acetaminophen (TYLENOL) 650 MG CR tablet Take 1,300 mg by mouth every 8 (eight) hours as needed for pain.   Yes Historical Provider, MD  amLODipine (NORVASC) 5 MG tablet Take 1 tablet (5 mg total) by mouth daily. 01/21/16  Yes Will Meredith Leeds, MD  carvedilol (COREG) 12.5 MG tablet Take 1 tablet (12.5 mg total) by mouth 2 (two) times daily. 01/21/16 04/20/16 Yes Will Meredith Leeds, MD  Cholecalciferol (VITAMIN D-3 PO) Take 1 capsule by mouth at bedtime.   Yes Historical Provider, MD  DULoxetine (CYMBALTA) 30 MG capsule Take 1 capsule (30 mg total) by mouth daily. 09/24/15  Yes Brunetta Jeans, PA-C  ergocalciferol (VITAMIN D2) 50000  units capsule Take 1 capsule (50,000 Units total) by mouth every 30 (thirty) days. 08/29/15  Yes Brunetta Jeans, PA-C  flecainide (TAMBOCOR) 50 MG tablet Take 1.5 tablets (75 mg total) by mouth 2 (two) times daily. May take additional 1-2 tablets (50-100 mg) daily as needed for palpitations 01/16/16  Yes Evans Lance, MD  rivaroxaban (XARELTO) 20 MG TABS tablet Take 1 tablet (20 mg total) by mouth daily with supper. 01/29/16  Yes Evans Lance, MD  ALPRAZolam Duanne Moron) 0.5 MG tablet Take 1 tablet (0.5 mg total) by mouth 3  (three) times daily as needed for anxiety. 12/24/15   Brunetta Jeans, PA-C    Family History Family History  Problem Relation Age of Onset  . Cancer Mother   . Stroke Father   . Hypertension Father   . Hypertension Maternal Grandmother   . Diabetes Neg Hx     Social History Social History  Substance Use Topics  . Smoking status: Former Smoker    Types: Cigarettes    Quit date: 2015  . Smokeless tobacco: Former Systems developer     Comment: Pt started to use vaporizer-uses rarely  . Alcohol use 0.6 oz/week    1 Glasses of wine per week     Comment: social or occationally     Allergies   Ace inhibitors; Hctz [hydrochlorothiazide]; Other; Oxycodone; Sulfa antibiotics; Sulfur; and Voltaren [diclofenac sodium]   Review of Systems Review of Systems  Constitutional: Negative for chills, fever and unexpected weight change.  Eyes: Negative for pain and visual disturbance.  Respiratory: Positive for shortness of breath. Negative for cough.   Cardiovascular: Positive for palpitations. Negative for chest pain and leg swelling.  Gastrointestinal: Negative for abdominal pain, diarrhea, nausea and vomiting.  Genitourinary: Negative for dysuria and hematuria.  Musculoskeletal: Negative for back pain, neck pain and neck stiffness.  Skin: Negative for rash.  Neurological: Positive for dizziness. Negative for weakness, numbness and headaches.  All other systems reviewed and are negative.    Physical Exam Updated Vital Signs BP 129/90   Pulse (!) 113   Temp 97.3 F (36.3 C) (Oral)   Resp 13   Ht 5\' 7"  (1.702 m)   Wt 96.6 kg   SpO2 98%   BMI 33.36 kg/m   Physical Exam  Constitutional: She is oriented to person, place, and time. She appears well-developed and well-nourished.  HENT:  Head: Normocephalic and atraumatic.  Right Ear: Tympanic membrane and ear canal normal.  Left Ear: Tympanic membrane and ear canal normal.  Nose: Nose normal.  Mouth/Throat: Uvula is midline, oropharynx  is clear and moist and mucous membranes are normal.  Eyes: Conjunctivae and EOM are normal. Pupils are equal, round, and reactive to light.  Neck: Normal range of motion. Neck supple.  Cardiovascular: Normal heart sounds.  An irregularly irregular rhythm present. Tachycardia present.  Exam reveals no gallop and no friction rub.   No murmur heard. Pulses:      Radial pulses are 2+ on the right side, and 2+ on the left side.       Dorsalis pedis pulses are 2+ on the right side, and 2+ on the left side.  Pulmonary/Chest: Effort normal and breath sounds normal. She has no wheezes. She has no rales.  Abdominal: Soft. Bowel sounds are normal. There is no tenderness. There is no rebound and no guarding.  Musculoskeletal: Normal range of motion. She exhibits no edema.  Neurological: She is alert and oriented to person, place, and  time. She has normal strength.  No acute neuro deficits.   Skin: Skin is warm and dry.  Nursing note and vitals reviewed.    ED Treatments / Results  Labs (all labs ordered are listed, but only abnormal results are displayed) Labs Reviewed  COMPREHENSIVE METABOLIC PANEL - Abnormal; Notable for the following:       Result Value   Glucose, Bld 101 (*)    All other components within normal limits  TROPONIN I - Abnormal; Notable for the following:    Troponin I 0.05 (*)    All other components within normal limits  PROTIME-INR - Abnormal; Notable for the following:    Prothrombin Time 17.4 (*)    All other components within normal limits  CBC WITH DIFFERENTIAL/PLATELET    EKG  EKG Interpretation  Date/Time:  Thursday March 20 2016 12:04:16 EDT Ventricular Rate:  108 PR Interval:    QRS Duration: 95 QT Interval:  399 QTC Calculation: 535 R Axis:   -24 Text Interpretation:  Ectopic atrial tachycardia, unifocal Borderline left axis deviation Repol abnrm suggests ischemia, diffuse leads Prolonged QT interval Suspect Atrial Fib Confirmed by Rogene Houston  MD, SCOTT  [phone]) on 03/20/2016 12:24:46 PM       Radiology Dg Chest 2 View  Result Date: 03/20/2016 CLINICAL DATA:  Atrial fibrillation. Shortness of breath and dizziness. EXAM: CHEST  2 VIEW COMPARISON:  None. FINDINGS: Lungs are clear. Heart is upper normal in size with pulmonary vascularity within normal limits. No adenopathy. There is atherosclerotic calcification in the aorta. There is anterior wedging of the L1 vertebral body. IMPRESSION: No edema or consolidation. Age uncertain anterior wedging of the L1 vertebral body. Aortic atherosclerosis. No adenopathy. Electronically Signed   By: Lowella Grip III M.D.   On: 03/20/2016 13:11    Procedures Procedures (including critical care time)  Medications Ordered in ED Medications  sodium chloride 0.9 % bolus 1,000 mL (1,000 mLs Intravenous New Bag/Given 03/20/16 1412)     Initial Impression / Assessment and Plan / ED Course  I have reviewed the triage vital signs and the nursing notes.  Pertinent labs & imaging results that were available during my care of the patient were reviewed by me and considered in my medical decision making (see chart for details).    Pt is a 66 y/o F who presents to ED for heart palpitations, SOB, and dizziness, noted to be in afib. Denies chest pain. Will get labs for electrolyte abnormality, CXR for acute pulmonoary process and EKG for ischemia. Took: Carverdilol 12.5mg , norvasc 5mg , Xarelto, and flecainide 100mg  this AM. Doubt PE at this time. Will discuss with her cardiologist Dr. Lovena Le.  1:51 PM Pt updated on results, trop 0.05. Left message with Dr. Tanna Furry office (cardiology) to discuss further.  2:13 PM Discussed with Dr. Lovena Le, updated on results. Will plan to have cardiology consulted. Pt updated on results and plan, denies CP, offers no additional concerns at this time.   3:45 PM  Discussed with cardiology PA, will discuss with attending and call back with plan   The patient was discussed with  and seen by Dr. Rogene Houston who agrees with the treatment plan.  EKG: atrial fibrillation, rate 108.    Final Clinical Impressions(s) / ED Diagnoses   Final diagnoses:  Atrial fibrillation, unspecified type [facility])    New Prescriptions New Prescriptions   No medications on file     Ulice Bold, NP 03/20/16 [place], MD 03/24/16 1856

## 2016-03-20 NOTE — ED Triage Notes (Signed)
Pt BIB [facility] from home where pt reports that she felt herself go into Afib yesterday around 1730. Pt reports attempting to take more of her medications to help, as advised by her PCP. Pt reports she felt this lower her BP and her PCP advised her to come to ED. [facility] reports the call was for SOB. EMS also reports that pt had exertional SOB, dizziness, and diaphoresis. Initial BP by EMS 100/60.

## 2016-03-20 NOTE — ED Notes (Signed)
Pt wheeled to restroom; reports she feels tired and weak upon getting back to room and back on monitor.

## 2016-03-20 NOTE — Progress Notes (Signed)
Patient arrives to room 2W36 from the ER.  Telemetry monitor applied and CCMD notified.  Patient oriented to unit and room to include call light and phone.  Will continue to monitor.

## 2016-03-21 ENCOUNTER — Encounter: Payer: Self-pay | Admitting: Physician Assistant

## 2016-03-21 DIAGNOSIS — I481 Persistent atrial fibrillation: Secondary | ICD-10-CM | POA: Diagnosis not present

## 2016-03-21 DIAGNOSIS — I480 Paroxysmal atrial fibrillation: Secondary | ICD-10-CM | POA: Diagnosis not present

## 2016-03-21 DIAGNOSIS — G4733 Obstructive sleep apnea (adult) (pediatric): Secondary | ICD-10-CM | POA: Diagnosis not present

## 2016-03-21 DIAGNOSIS — I110 Hypertensive heart disease with heart failure: Secondary | ICD-10-CM | POA: Diagnosis not present

## 2016-03-21 DIAGNOSIS — I10 Essential (primary) hypertension: Secondary | ICD-10-CM | POA: Diagnosis not present

## 2016-03-21 DIAGNOSIS — I5032 Chronic diastolic (congestive) heart failure: Secondary | ICD-10-CM | POA: Diagnosis not present

## 2016-03-21 LAB — CBC
HCT: 35.6 % — ABNORMAL LOW (ref 36.0–46.0)
Hemoglobin: 11.3 g/dL — ABNORMAL LOW (ref 12.0–15.0)
MCH: 28.2 pg (ref 26.0–34.0)
MCHC: 31.7 g/dL (ref 30.0–36.0)
MCV: 88.8 fL (ref 78.0–100.0)
Platelets: 312 10*3/uL (ref 150–400)
RBC: 4.01 MIL/uL (ref 3.87–5.11)
RDW: 15.3 % (ref 11.5–15.5)
WBC: 8.4 10*3/uL (ref 4.0–10.5)

## 2016-03-21 LAB — HIV ANTIBODY (ROUTINE TESTING W REFLEX): HIV Screen 4th Generation wRfx: NONREACTIVE

## 2016-03-21 LAB — BASIC METABOLIC PANEL
ANION GAP: 8 (ref 5–15)
BUN: 12 mg/dL (ref 6–20)
CALCIUM: 9.4 mg/dL (ref 8.9–10.3)
CO2: 27 mmol/L (ref 22–32)
Chloride: 103 mmol/L (ref 101–111)
Creatinine, Ser: 0.99 mg/dL (ref 0.44–1.00)
GFR, EST NON AFRICAN AMERICAN: 59 mL/min — AB (ref >60)
GLUCOSE: 103 mg/dL — AB (ref 65–99)
POTASSIUM: 3.9 mmol/L (ref 3.5–5.1)
Sodium: 138 mmol/L (ref 135–145)

## 2016-03-21 LAB — TROPONIN I: TROPONIN I: 0.04 ng/mL — AB (ref <0.03)

## 2016-03-21 MED ORDER — CARVEDILOL 6.25 MG PO TABS
6.2500 mg | ORAL_TABLET | Freq: Two times a day (BID) | ORAL | Status: DC
  Administered 2016-03-21: 6.25 mg via ORAL

## 2016-03-21 NOTE — Progress Notes (Signed)
Discharged to home with family office visits in place teaching done  

## 2016-03-21 NOTE — Discharge Instructions (Signed)
Come to short stay at [facility] at 6:45AM Monday morning. Nothing to eat or drink after midnight Sunday night. Stay on your medications as scheduled. Do not miss any doses of your Xarelto.  Come to short stay at [facility] at 6AM on Tuesday morning. Nothing to eat or drink after midnight Monday night. Ok to take medications as scheduled Tuesday morning. Do not miss any doses of Xarelto.

## 2016-03-21 NOTE — Consult Note (Signed)
ELECTROPHYSIOLOGY CONSULT NOTE    Patient ID: Shannon [name] MRN: [id number], DOB/AGE: December 09, 1950 66 y.o.  Admit date: 03/20/2016 Date of Consult: 03/21/2016  Primary Physician: Leeanne Rio, PA-C Primary Electrophysiologist: Leamon Arnt MD: [facility]  Reason for Consultation: atrial fibrillation  HPI:  Shannon [name] is a 66 y.o. female with a past medical history significant for persistent atrial fibrillation, hyperlipidemia, chronic diastolic heart failure, obesity, and OSA.  She has been managed with Flecainide and BB.  With higher doses of BB, she has had symptomatic bradycardia.  She last underwent cardioversion in December of last year and has maintained SR since. Her Metoprolol was changed to Coreg with subsequent worsening fatigue.  Yesterday, she went into AF and took an extra 25mg  of Flecainide followed by another extra 25mg  yesterday evening. She then developed hypotension and pre-syncope for which EMS was called. On their arrival, she was in AF with RVR and hypotensive. She was transferred to [facility] for further evaluation. Her BP improved with fluid resuscitation. She was placed on Dilt drip and converted to SR.  EP has been asked to evaluate for treatment options.  Echo 11/2015 demonstrated normal LVEF, LA 44.  She currently feels much improved. She denies chest pain, shortness of breath, LE edema, recent fevers, chills, nausea or vomiting.   She has been working on lifesytle modification. She is compliant with CPAP, has been working on diet, does not drink ETOH, has cut back on caffeine.  She has not been able to exercise because of back pain (vertebral fracture).   Past Medical History:  Diagnosis Date  . Anxiety   . Arthritis    neck and back  . Degenerative disorder of bone   . Depression   . Diastolic dysfunction   . GERD (gastroesophageal reflux disease)   . Herniated disc, cervical   . Hyperlipidemia   . Hypertension   . Lupus    HCTZ  induced  . 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 Obstructive sleep apnea   . Orthostatic hypotension   . Osteopenia   . PAF (paroxysmal atrial fibrillation) ([place])   . Pinched nerve in neck   . T12 compression fracture ([place]) 11/2015  . Vitamin D deficiency   . Whiplash injury 06/07/2010     Surgical History:  Past Surgical History:  Procedure Laterality Date  . APPENDECTOMY    . arm surgery Left      Prescriptions Prior to Admission  Medication Sig Dispense Refill Last Dose  . acetaminophen (TYLENOL) 650 MG CR tablet Take 1,300 mg by mouth every 8 (eight) hours as needed for pain.   Past Week at Unknown time  . amLODipine (NORVASC) 5 MG tablet Take 1 tablet (5 mg total) by mouth daily.   03/20/2016 at Unknown time  . carvedilol (COREG) 12.5 MG tablet Take 1 tablet (12.5 mg total) by mouth 2 (two) times daily. 180 tablet 1 03/20/2016 at 0900  . Cholecalciferol (VITAMIN D-3 PO) Take 1 capsule by mouth at bedtime.   03/19/2016 at Unknown time  . DULoxetine (CYMBALTA) 30 MG capsule Take 1 capsule (30 mg total) by mouth daily. 30 capsule 5 03/19/2016 at Unknown time  . ergocalciferol (VITAMIN D2) 50000 units capsule Take 1 capsule (50,000 Units total) by mouth every 30 (thirty) days. 4 capsule 3 Past Week at Unknown time  . flecainide (TAMBOCOR) 50 MG tablet Take 1.5 tablets (75 mg total) by mouth 2 (  two) times daily. May take additional 1-2 tablets (50-100 mg) daily as needed for palpitations 120 tablet 3 03/20/2016 at Unknown time  . rivaroxaban (XARELTO) 20 MG TABS tablet Take 1 tablet (20 mg total) by mouth daily with supper. 30 tablet 11 03/19/2016 at 2200  . ALPRAZolam (XANAX) 0.5 MG tablet Take 1 tablet (0.5 mg total) by mouth 3 (three) times daily as needed for anxiety. 30 tablet 1 Unknown at Unknown    Inpatient Medications:  . amLODipine  5 mg Oral Daily  . aspirin EC  81 mg Oral Daily  . carvedilol  12.5 mg Oral BID  . DULoxetine   30 mg Oral Daily  . flecainide  75 mg Oral BID  . rivaroxaban  20 mg Oral Q supper  . sodium chloride flush  3 mL Intravenous Q12H  . Vitamin D (Ergocalciferol)  50,000 Units Oral Q30 days    Allergies:  Allergies  Allergen Reactions  . Ace Inhibitors Cough  . Hctz [Hydrochlorothiazide] Other (See Comments)    Caused drug-induced LUPUS  . Other Other (See Comments)    Unknown anti-inflammatory: Caused extremely aggressive behavior (not Prednisone)  . Oxycodone Other (See Comments)    Hallucinations  . Sulfa Antibiotics Nausea And Vomiting  . Sulfur Nausea And Vomiting  . Voltaren [Diclofenac Sodium] Other (See Comments)    Hypersensitivity    Social History   Social History  . Marital status: Married    Spouse name: N/A  . Number of children: N/A  . Years of education: N/A   Occupational History  . Not on file.   Social History Main Topics  . Smoking status: Former Smoker    Types: Cigarettes    Quit date: 2015  . Smokeless tobacco: Former Systems developer     Comment: Pt started to use vaporizer-uses rarely  . Alcohol use 0.6 oz/week    1 Glasses of wine per week     Comment: social or occationally  . Drug use: No  . Sexual activity: Yes   Other Topics Concern  . Not on file   Social History Narrative   ** Merged History Encounter **         Family History  Problem Relation Age of Onset  . Cancer Mother   . Stroke Father   . Hypertension Father   . Hypertension Maternal Grandmother   . Diabetes Neg Hx      Review of Systems: All other systems reviewed and are otherwise negative except as noted above.  Physical Exam: Vitals:   03/20/16 1800 03/20/16 1838 03/20/16 2129 03/21/16 0543  BP: (!) 141/96 138/73 (!) 106/58 (!) 110/54  Pulse: (!) 123 84 66 (!) 57  Resp: 20 (!) 22 18   Temp:  98.4 F (36.9 C) 98.2 F (36.8 C) 98.2 F (36.8 C)  TempSrc:  Oral Oral Oral  SpO2: 97% 98% 98% 98%  Weight:    216 lb 14.9 oz (98.4 kg)  Height:        GEN- The  patient is obese appearing, alert and oriented x 3 today.   HEENT: normocephalic, atraumatic; sclera clear, conjunctiva pink; hearing intact; oropharynx clear; neck supple  Lungs- Clear to ausculation bilaterally, normal work of breathing.  No wheezes, rales, rhonchi Heart- Regular rate and rhythm, no murmurs, rubs or gallops, PMI not laterally displaced GI- soft, non-tender, non-distended, bowel sounds present, no hepatosplenomegaly Extremities- no clubbing, cyanosis, or edema; DP/PT/radial pulses 2+ bilaterally MS- no significant deformity or atrophy Skin- warm and  dry, no rash or lesion Psych- euthymic mood, full affect Neuro- strength and sensation are intact  Labs:   Lab Results  Component Value Date   WBC 8.4 03/21/2016   HGB 11.3 (L) 03/21/2016   HCT 35.6 (L) 03/21/2016   MCV 88.8 03/21/2016   PLT 312 03/21/2016    Recent Labs Lab 03/20/16 1250 03/21/16 0511  NA 139 138  K 3.9 3.9  CL 105 103  CO2 25 27  BUN 8 12  CREATININE 0.73 0.99  CALCIUM 9.9 9.4  PROT 7.8  --   BILITOT 0.4  --   ALKPHOS 118  --   ALT 23  --   AST 23  --   GLUCOSE 101* 103*      Radiology/Studies: Dg Chest 2 View Result Date: 03/20/2016 CLINICAL DATA:  Atrial fibrillation. Shortness of breath and dizziness. EXAM: CHEST  2 VIEW COMPARISON:  None. FINDINGS: Lungs are clear. Heart is upper normal in size with pulmonary vascularity within normal limits. No adenopathy. There is atherosclerotic calcification in the aorta. There is anterior wedging of the L1 vertebral body. IMPRESSION: No edema or consolidation. Age uncertain anterior wedging of the L1 vertebral body. Aortic atherosclerosis. No adenopathy. Electronically Signed   By: Lowella Grip III M.D.   On: 03/20/2016 13:11    EKG: sinus brady, 57, TWI (personally reviewed)  TELEMETRY:  Sinus brady, sinus rhythm (personally reviewed)  Assessment/Plan: 1.  Persistent atrial fibrillation The patient has symptomatic persistent atrial  fibrillation and has failed Flecainide. Other AAD options are limited 2/2 symptomatic bradycardia. We have discussed Tikosyn or ablation as options today.  She is leaning towards ablation but would like to discuss further with Dr Rayann Heman She reports compliance with Xarelto for last 4 weeks with no missed doses. Continue Xarelto long term for CHADS2VASC of 2  2.  HTN Stable No change required today  3.  Obesity Body mass index is 33.98 kg/m. Weight loss advised We have discussed walking in pool at nearby [facility]  4. OSA She is compliant with CPAP    Signed, Shannon Marshall, NP 03/21/2016 8:58 AM   I have seen, examined the patient, and reviewed the above assessment and plan.  On exam, overweight, RRR.  Changes to above are made where necessary.  She has persistent afib and has failed medical therapy with flecainide.  Given bradycardia, medical options include tikosyn and amiodarone. Therapeutic strategies for afib including medicine and ablation were discussed in detail with the patient today. Risk, benefits, and alternatives to EP study and radiofrequency ablation for afib were also discussed in detail today. These risks include but are not limited to stroke, bleeding, vascular damage, tamponade, perforation, damage to the esophagus, lungs, and other structures, pulmonary vein stenosis, worsening renal function, and death. The patient understands these risk and wishes to proceed.  We will therefore proceed with catheter ablation at the next available time. She is scheduled for TEE on Monday and ablation this coming Tuesday. Compliance with xarelto in the interim is encouraged.  OK to discharge to home at this time.   Co Sign: [name], MD 03/21/2016 12:23 PM

## 2016-03-21 NOTE — Discharge Summary (Signed)
ELECTROPHYSIOLOGY PROCEDURE DISCHARGE SUMMARY    Patient ID: Shannon [name],  MRN: [id number], DOB/AGE: [birth year] 66 y.o.  Admit date: 03/20/2016 Discharge date: 03/21/2016  Primary Care Physician: Leeanne Rio, PA-C Electrophysiologist: Lovena Le  Primary Discharge Diagnosis:  1.  Persistent atrial fibrillation  Secondary Discharge Diagnosis: 1.  Chronic diastolic heart failure 2.  HTN 3.  Obesity 4.  OSA   Allergies  Allergen Reactions  . Ace Inhibitors Cough  . Hctz [Hydrochlorothiazide] Other (See Comments)    Caused drug-induced LUPUS  . Other Other (See Comments)    Unknown anti-inflammatory: Caused extremely aggressive behavior (not Prednisone)  . Oxycodone Other (See Comments)    Hallucinations  . Sulfa Antibiotics Nausea And Vomiting  . Sulfur Nausea And Vomiting  . Voltaren [Diclofenac Sodium] Other (See Comments)    Hypersensitivity    Brief HPI/Hospital Course:  Shannon [name] is a 66 y.o. female with a past medical history significant for persistent atrial fibrillation, hyperlipidemia, chronic diastolic heart failure, obesity, and OSA.  She has been managed with Flecainide and BB.  With higher doses of BB, she has had symptomatic bradycardia.  She last underwent cardioversion in December of last year and has maintained SR since. Her Metoprolol was changed to Coreg with subsequent worsening fatigue.  Yesterday, she went into AF and took an extra 25mg  of Flecainide followed by another extra 25mg  yesterday evening. She then developed hypotension and pre-syncope for which EMS was called. On their arrival, she was in AF with RVR and hypotensive. She was transferred to [facility] for further evaluation. Her BP improved with fluid resuscitation. She was placed on Dilt drip and converted to SR.  She was seen by Dr Rayann Heman with treatment options discussed. She was offered Tikosyn vs ablation. After discussion of risks, benefits, she would prefer to pursue  ablation.Will plan for next week.  Instructions in AVS.     Physical Exam: Vitals:   03/20/16 1838 03/20/16 2129 03/21/16 0543 03/21/16 1307  BP: 138/73 (!) 106/58 (!) 110/54 (!) 110/52  Pulse: 84 66 (!) 57 60  Resp: (!) 22 18  20   Temp: 98.4 F (36.9 C) 98.2 F (36.8 C) 98.2 F (36.8 C) 98.1 F (36.7 C)  TempSrc: Oral Oral Oral Oral  SpO2: 98% 98% 98% 98%  Weight:   216 lb 14.9 oz (98.4 kg)   Height:          Labs:   Lab Results  Component Value Date   WBC 8.4 03/21/2016   HGB 11.3 (L) 03/21/2016   HCT 35.6 (L) 03/21/2016   MCV 88.8 03/21/2016   PLT 312 03/21/2016     Recent Labs Lab 03/20/16 1250 03/21/16 0511  NA 139 138  K 3.9 3.9  CL 105 103  CO2 25 27  BUN 8 12  CREATININE 0.73 0.99  CALCIUM 9.9 9.4  PROT 7.8  --   BILITOT 0.4  --   ALKPHOS 118  --   ALT 23  --   AST 23  --   GLUCOSE 101* 103*     Discharge Medications:  Current Discharge Medication List    CONTINUE these medications which have NOT CHANGED   Details  acetaminophen (TYLENOL) 650 MG CR tablet Take 1,300 mg by mouth every 8 (eight) hours as needed for pain.    amLODipine (NORVASC) 5 MG tablet Take 1 tablet (5 mg total) by mouth daily.    carvedilol (COREG) 12.5 MG tablet Take 1 tablet (12.5 mg  total) by mouth 2 (two) times daily. Qty: 180 tablet, Refills: 1    Cholecalciferol (VITAMIN D-3 PO) Take 1 capsule by mouth at bedtime.    DULoxetine (CYMBALTA) 30 MG capsule Take 1 capsule (30 mg total) by mouth daily. Qty: 30 capsule, Refills: 5    ergocalciferol (VITAMIN D2) 50000 units capsule Take 1 capsule (50,000 Units total) by mouth every 30 (thirty) days. Qty: 4 capsule, Refills: 3    flecainide (TAMBOCOR) 50 MG tablet Take 1.5 tablets (75 mg total) by mouth 2 (two) times daily. May take additional 1-2 tablets (50-100 mg) daily as needed for palpitations Qty: 120 tablet, Refills: 3    rivaroxaban (XARELTO) 20 MG TABS tablet Take 1 tablet (20 mg total) by mouth daily with  supper. Qty: 30 tablet, Refills: 11    ALPRAZolam (XANAX) 0.5 MG tablet Take 1 tablet (0.5 mg total) by mouth 3 (three) times daily as needed for anxiety. Qty: 30 tablet, Refills: 1        Disposition: Pt is being discharged home today in good condition. Discharge Instructions    Diet - low sodium heart healthy    Complete by:  As directed    Increase activity slowly    Complete by:  As directed        Duration of Discharge Encounter: Greater than 30 minutes including physician time.  Signed, Chanetta Marshall, NP 03/21/2016 2:34 PM  I have seen, examined the patient, and reviewed the above assessment and plan.  On exam, RRR.   Changes to above are made where necessary.  DC to home.  Co Sign: [name], MD 03/21/2016 3:11 PM

## 2016-03-24 ENCOUNTER — Ambulatory Visit (HOSPITAL_BASED_OUTPATIENT_CLINIC_OR_DEPARTMENT_OTHER): Payer: BLUE CROSS/BLUE SHIELD

## 2016-03-24 ENCOUNTER — Encounter (HOSPITAL_COMMUNITY): Payer: Self-pay | Admitting: *Deleted

## 2016-03-24 ENCOUNTER — Encounter (HOSPITAL_COMMUNITY)
Admission: AD | Disposition: A | Discharge status: Home / Self Care | Payer: Self-pay | Source: Ambulatory Visit | Attending: Cardiovascular Disease

## 2016-03-24 ENCOUNTER — Ambulatory Visit (HOSPITAL_COMMUNITY)
Admission: AD | Admit: 2016-03-24 | Discharge: 2016-03-24 | Disposition: A | Discharge status: Home / Self Care | Payer: BLUE CROSS/BLUE SHIELD | Source: Ambulatory Visit | Attending: Cardiovascular Disease | Admitting: Cardiovascular Disease

## 2016-03-24 DIAGNOSIS — F329 Major depressive disorder, single episode, unspecified: Secondary | ICD-10-CM | POA: Insufficient documentation

## 2016-03-24 DIAGNOSIS — M329 Systemic lupus erythematosus, unspecified: Secondary | ICD-10-CM | POA: Diagnosis not present

## 2016-03-24 DIAGNOSIS — Z885 Allergy status to narcotic agent status: Secondary | ICD-10-CM | POA: Diagnosis not present

## 2016-03-24 DIAGNOSIS — G4733 Obstructive sleep apnea (adult) (pediatric): Secondary | ICD-10-CM | POA: Insufficient documentation

## 2016-03-24 DIAGNOSIS — I951 Orthostatic hypotension: Secondary | ICD-10-CM | POA: Diagnosis not present

## 2016-03-24 DIAGNOSIS — M47899 Other spondylosis, site unspecified: Secondary | ICD-10-CM | POA: Insufficient documentation

## 2016-03-24 DIAGNOSIS — Z888 Allergy status to other drugs, medicaments and biological substances status: Secondary | ICD-10-CM | POA: Diagnosis not present

## 2016-03-24 DIAGNOSIS — Z7982 Long term (current) use of aspirin: Secondary | ICD-10-CM | POA: Insufficient documentation

## 2016-03-24 DIAGNOSIS — M8580 Other specified disorders of bone density and structure, unspecified site: Secondary | ICD-10-CM | POA: Diagnosis not present

## 2016-03-24 DIAGNOSIS — F419 Anxiety disorder, unspecified: Secondary | ICD-10-CM | POA: Insufficient documentation

## 2016-03-24 DIAGNOSIS — I481 Persistent atrial fibrillation: Secondary | ICD-10-CM | POA: Diagnosis present

## 2016-03-24 DIAGNOSIS — Z6833 Body mass index (BMI) 33.0-33.9, adult: Secondary | ICD-10-CM | POA: Insufficient documentation

## 2016-03-24 DIAGNOSIS — M47892 Other spondylosis, cervical region: Secondary | ICD-10-CM | POA: Diagnosis not present

## 2016-03-24 DIAGNOSIS — Z79899 Other long term (current) drug therapy: Secondary | ICD-10-CM | POA: Insufficient documentation

## 2016-03-24 DIAGNOSIS — E785 Hyperlipidemia, unspecified: Secondary | ICD-10-CM | POA: Diagnosis not present

## 2016-03-24 DIAGNOSIS — Z87891 Personal history of nicotine dependence: Secondary | ICD-10-CM | POA: Insufficient documentation

## 2016-03-24 DIAGNOSIS — I110 Hypertensive heart disease with heart failure: Secondary | ICD-10-CM | POA: Insufficient documentation

## 2016-03-24 DIAGNOSIS — I340 Nonrheumatic mitral (valve) insufficiency: Secondary | ICD-10-CM

## 2016-03-24 DIAGNOSIS — I480 Paroxysmal atrial fibrillation: Secondary | ICD-10-CM

## 2016-03-24 DIAGNOSIS — K219 Gastro-esophageal reflux disease without esophagitis: Secondary | ICD-10-CM | POA: Insufficient documentation

## 2016-03-24 DIAGNOSIS — E559 Vitamin D deficiency, unspecified: Secondary | ICD-10-CM | POA: Insufficient documentation

## 2016-03-24 DIAGNOSIS — I483 Typical atrial flutter: Secondary | ICD-10-CM

## 2016-03-24 DIAGNOSIS — I5032 Chronic diastolic (congestive) heart failure: Secondary | ICD-10-CM | POA: Diagnosis not present

## 2016-03-24 DIAGNOSIS — E669 Obesity, unspecified: Secondary | ICD-10-CM | POA: Diagnosis not present

## 2016-03-24 HISTORY — PX: TEE WITHOUT CARDIOVERSION: SHX5443

## 2016-03-24 SURGERY — ECHOCARDIOGRAM, TRANSESOPHAGEAL
Anesthesia: Moderate Sedation

## 2016-03-24 MED ORDER — MIDAZOLAM HCL 10 MG/2ML IJ SOLN
INTRAMUSCULAR | Status: DC | PRN
  Administered 2016-03-24: 1 mg via INTRAVENOUS
  Administered 2016-03-24: 2 mg via INTRAVENOUS

## 2016-03-24 MED ORDER — DIPHENHYDRAMINE HCL 50 MG/ML IJ SOLN
INTRAMUSCULAR | Status: AC
  Filled 2016-03-24: qty 1

## 2016-03-24 MED ORDER — BUTAMBEN-TETRACAINE-BENZOCAINE 2-2-14 % EX AERO
INHALATION_SPRAY | CUTANEOUS | Status: DC | PRN
  Administered 2016-03-24: 2 via TOPICAL

## 2016-03-24 MED ORDER — HYDRALAZINE HCL 20 MG/ML IJ SOLN
INTRAMUSCULAR | Status: AC
  Filled 2016-03-24: qty 1

## 2016-03-24 MED ORDER — SODIUM CHLORIDE 0.9 % IV SOLN
INTRAVENOUS | Status: DC
  Administered 2016-03-24: 07:00:00 via INTRAVENOUS

## 2016-03-24 MED ORDER — FENTANYL CITRATE (PF) 100 MCG/2ML IJ SOLN
INTRAMUSCULAR | Status: DC | PRN
  Administered 2016-03-24: 25 ug via INTRAVENOUS
  Administered 2016-03-24: 12.5 ug via INTRAVENOUS

## 2016-03-24 MED ORDER — FENTANYL CITRATE (PF) 100 MCG/2ML IJ SOLN
INTRAMUSCULAR | Status: AC
  Filled 2016-03-24: qty 2

## 2016-03-24 MED ORDER — MIDAZOLAM HCL 5 MG/ML IJ SOLN
INTRAMUSCULAR | Status: AC
  Filled 2016-03-24: qty 2

## 2016-03-24 MED ORDER — METOPROLOL TARTRATE 5 MG/5ML IV SOLN
INTRAVENOUS | Status: AC
  Filled 2016-03-24: qty 5

## 2016-03-24 NOTE — Progress Notes (Signed)
  Echocardiogram Echocardiogram Transesophageal has been performed.  Shannon [name] 03/24/2016, 8:43 AM

## 2016-03-24 NOTE — Op Note (Signed)
INDICATIONS: atrial flutter  PROCEDURE:   Informed consent was obtained prior to the procedure. The risks, benefits and alternatives for the procedure were discussed and the patient comprehended these risks.  Risks include, but are not limited to, cough, sore throat, vomiting, nausea, somnolence, esophageal and stomach trauma or perforation, bleeding, low blood pressure, aspiration, pneumonia, infection, trauma to the teeth and death.    After a procedural time-out, the oropharynx was anesthetized with 20% benzocaine spray.   During this procedure the patient was administered a total of Versed 3 mg and Fentanyl 37.5 mcg to achieve and maintain moderate conscious sedation.  The patient's heart rate, blood pressure, and oxygen saturationweare monitored continuously during the procedure. The period of conscious sedation was 9 minutes, of which I was present face-to-face 100% of this time.  The transesophageal probe was inserted in the esophagus and stomach without difficulty and multiple views were obtained.  The patient was kept under observation until the patient left the procedure room.  The patient left the procedure room in stable condition.   Agitated microbubble saline contrast was not administered.  COMPLICATIONS:    There were no immediate complications.  FINDINGS:  No LA thrombus. Mild LA dilation. Normal LVEF and wall motion. 1-2+ MR.  RECOMMENDATIONS:     Proceed with RF ablation  Time Spent Directly with the Patient:  30 minutes   Emett Stapel 03/24/2016, 8:26 AM

## 2016-03-24 NOTE — Discharge Instructions (Signed)

## 2016-03-24 NOTE — H&P (View-Only) (Signed)
ELECTROPHYSIOLOGY CONSULT NOTE    Patient ID: Shannon [name] MRN: [id number], DOB/AGE: 1950/10/11 66 y.o.  Admit date: 03/20/2016 Date of Consult: 03/21/2016  Primary Physician: Leeanne Rio, PA-C Primary Electrophysiologist: Leamon Arnt MD: [facility]  Reason for Consultation: atrial fibrillation  HPI:  Shannon [name] is a 66 y.o. female with a past medical history significant for persistent atrial fibrillation, hyperlipidemia, chronic diastolic heart failure, obesity, and OSA.  She has been managed with Flecainide and BB.  With higher doses of BB, she has had symptomatic bradycardia.  She last underwent cardioversion in December of last year and has maintained SR since. Her Metoprolol was changed to Coreg with subsequent worsening fatigue.  Yesterday, she went into AF and took an extra 25mg  of Flecainide followed by another extra 25mg  yesterday evening. She then developed hypotension and pre-syncope for which EMS was called. On their arrival, she was in AF with RVR and hypotensive. She was transferred to [facility] for further evaluation. Her BP improved with fluid resuscitation. She was placed on Dilt drip and converted to SR.  EP has been asked to evaluate for treatment options.  Echo 11/2015 demonstrated normal LVEF, LA 44.  She currently feels much improved. She denies chest pain, shortness of breath, LE edema, recent fevers, chills, nausea or vomiting.   She has been working on lifesytle modification. She is compliant with CPAP, has been working on diet, does not drink ETOH, has cut back on caffeine.  She has not been able to exercise because of back pain (vertebral fracture).   Past Medical History:  Diagnosis Date  . Anxiety   . Arthritis    neck and back  . Degenerative disorder of bone   . Depression   . Diastolic dysfunction   . GERD (gastroesophageal reflux disease)   . Herniated disc, cervical   . Hyperlipidemia   . Hypertension   . Lupus    HCTZ  induced  . 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 Obstructive sleep apnea   . Orthostatic hypotension   . Osteopenia   . PAF (paroxysmal atrial fibrillation) ([place])   . Pinched nerve in neck   . T12 compression fracture ([place]) 11/2015  . Vitamin D deficiency   . Whiplash injury 06/07/2010     Surgical History:  Past Surgical History:  Procedure Laterality Date  . APPENDECTOMY    . arm surgery Left      Prescriptions Prior to Admission  Medication Sig Dispense Refill Last Dose  . acetaminophen (TYLENOL) 650 MG CR tablet Take 1,300 mg by mouth every 8 (eight) hours as needed for pain.   Past Week at Unknown time  . amLODipine (NORVASC) 5 MG tablet Take 1 tablet (5 mg total) by mouth daily.   03/20/2016 at Unknown time  . carvedilol (COREG) 12.5 MG tablet Take 1 tablet (12.5 mg total) by mouth 2 (two) times daily. 180 tablet 1 03/20/2016 at 0900  . Cholecalciferol (VITAMIN D-3 PO) Take 1 capsule by mouth at bedtime.   03/19/2016 at Unknown time  . DULoxetine (CYMBALTA) 30 MG capsule Take 1 capsule (30 mg total) by mouth daily. 30 capsule 5 03/19/2016 at Unknown time  . ergocalciferol (VITAMIN D2) 50000 units capsule Take 1 capsule (50,000 Units total) by mouth every 30 (thirty) days. 4 capsule 3 Past Week at Unknown time  . flecainide (TAMBOCOR) 50 MG tablet Take 1.5 tablets (75 mg total) by mouth 2 (  two) times daily. May take additional 1-2 tablets (50-100 mg) daily as needed for palpitations 120 tablet 3 03/20/2016 at Unknown time  . rivaroxaban (XARELTO) 20 MG TABS tablet Take 1 tablet (20 mg total) by mouth daily with supper. 30 tablet 11 03/19/2016 at 2200  . ALPRAZolam (XANAX) 0.5 MG tablet Take 1 tablet (0.5 mg total) by mouth 3 (three) times daily as needed for anxiety. 30 tablet 1 Unknown at Unknown    Inpatient Medications:  . amLODipine  5 mg Oral Daily  . aspirin EC  81 mg Oral Daily  . carvedilol  12.5 mg Oral BID  . DULoxetine   30 mg Oral Daily  . flecainide  75 mg Oral BID  . rivaroxaban  20 mg Oral Q supper  . sodium chloride flush  3 mL Intravenous Q12H  . Vitamin D (Ergocalciferol)  50,000 Units Oral Q30 days    Allergies:  Allergies  Allergen Reactions  . Ace Inhibitors Cough  . Hctz [Hydrochlorothiazide] Other (See Comments)    Caused drug-induced LUPUS  . Other Other (See Comments)    Unknown anti-inflammatory: Caused extremely aggressive behavior (not Prednisone)  . Oxycodone Other (See Comments)    Hallucinations  . Sulfa Antibiotics Nausea And Vomiting  . Sulfur Nausea And Vomiting  . Voltaren [Diclofenac Sodium] Other (See Comments)    Hypersensitivity    Social History   Social History  . Marital status: Married    Spouse name: N/A  . Number of children: N/A  . Years of education: N/A   Occupational History  . Not on file.   Social History Main Topics  . Smoking status: Former Smoker    Types: Cigarettes    Quit date: 2015  . Smokeless tobacco: Former Systems developer     Comment: Pt started to use vaporizer-uses rarely  . Alcohol use 0.6 oz/week    1 Glasses of wine per week     Comment: social or occationally  . Drug use: No  . Sexual activity: Yes   Other Topics Concern  . Not on file   Social History Narrative   ** Merged History Encounter **         Family History  Problem Relation Age of Onset  . Cancer Mother   . Stroke Father   . Hypertension Father   . Hypertension Maternal Grandmother   . Diabetes Neg Hx      Review of Systems: All other systems reviewed and are otherwise negative except as noted above.  Physical Exam: Vitals:   03/20/16 1800 03/20/16 1838 03/20/16 2129 03/21/16 0543  BP: (!) 141/96 138/73 (!) 106/58 (!) 110/54  Pulse: (!) 123 84 66 (!) 57  Resp: 20 (!) 22 18   Temp:  98.4 F (36.9 C) 98.2 F (36.8 C) 98.2 F (36.8 C)  TempSrc:  Oral Oral Oral  SpO2: 97% 98% 98% 98%  Weight:    216 lb 14.9 oz (98.4 kg)  Height:        GEN- The  patient is obese appearing, alert and oriented x 3 today.   HEENT: normocephalic, atraumatic; sclera clear, conjunctiva pink; hearing intact; oropharynx clear; neck supple  Lungs- Clear to ausculation bilaterally, normal work of breathing.  No wheezes, rales, rhonchi Heart- Regular rate and rhythm, no murmurs, rubs or gallops, PMI not laterally displaced GI- soft, non-tender, non-distended, bowel sounds present, no hepatosplenomegaly Extremities- no clubbing, cyanosis, or edema; DP/PT/radial pulses 2+ bilaterally MS- no significant deformity or atrophy Skin- warm and  dry, no rash or lesion Psych- euthymic mood, full affect Neuro- strength and sensation are intact  Labs:   Lab Results  Component Value Date   WBC 8.4 03/21/2016   HGB 11.3 (L) 03/21/2016   HCT 35.6 (L) 03/21/2016   MCV 88.8 03/21/2016   PLT 312 03/21/2016    Recent Labs Lab 03/20/16 1250 03/21/16 0511  NA 139 138  K 3.9 3.9  CL 105 103  CO2 25 27  BUN 8 12  CREATININE 0.73 0.99  CALCIUM 9.9 9.4  PROT 7.8  --   BILITOT 0.4  --   ALKPHOS 118  --   ALT 23  --   AST 23  --   GLUCOSE 101* 103*      Radiology/Studies: Dg Chest 2 View Result Date: 03/20/2016 CLINICAL DATA:  Atrial fibrillation. Shortness of breath and dizziness. EXAM: CHEST  2 VIEW COMPARISON:  None. FINDINGS: Lungs are clear. Heart is upper normal in size with pulmonary vascularity within normal limits. No adenopathy. There is atherosclerotic calcification in the aorta. There is anterior wedging of the L1 vertebral body. IMPRESSION: No edema or consolidation. Age uncertain anterior wedging of the L1 vertebral body. Aortic atherosclerosis. No adenopathy. Electronically Signed   By: Lowella Grip III M.D.   On: 03/20/2016 13:11    EKG: sinus brady, 57, TWI (personally reviewed)  TELEMETRY:  Sinus brady, sinus rhythm (personally reviewed)  Assessment/Plan: 1.  Persistent atrial fibrillation The patient has symptomatic persistent atrial  fibrillation and has failed Flecainide. Other AAD options are limited 2/2 symptomatic bradycardia. We have discussed Tikosyn or ablation as options today.  She is leaning towards ablation but would like to discuss further with Dr Rayann Heman She reports compliance with Xarelto for last 4 weeks with no missed doses. Continue Xarelto long term for CHADS2VASC of 2  2.  HTN Stable No change required today  3.  Obesity Body mass index is 33.98 kg/m. Weight loss advised We have discussed walking in pool at nearby [facility]  4. OSA She is compliant with CPAP    Signed, Chanetta Marshall, NP 03/21/2016 8:58 AM   I have seen, examined the patient, and reviewed the above assessment and plan.  On exam, overweight, RRR.  Changes to above are made where necessary.  She has persistent afib and has failed medical therapy with flecainide.  Given bradycardia, medical options include tikosyn and amiodarone. Therapeutic strategies for afib including medicine and ablation were discussed in detail with the patient today. Risk, benefits, and alternatives to EP study and radiofrequency ablation for afib were also discussed in detail today. These risks include but are not limited to stroke, bleeding, vascular damage, tamponade, perforation, damage to the esophagus, lungs, and other structures, pulmonary vein stenosis, worsening renal function, and death. The patient understands these risk and wishes to proceed.  We will therefore proceed with catheter ablation at the next available time. She is scheduled for TEE on Monday and ablation this coming Tuesday. Compliance with xarelto in the interim is encouraged.  OK to discharge to home at this time.   Co Sign: [name], MD 03/21/2016 12:23 PM

## 2016-03-24 NOTE — Interval H&P Note (Signed)
History and Physical Interval Note:  03/24/2016 8:26 AM  Shannon [name]  has presented today for surgery, with the diagnosis of a flutter  The various methods of treatment have been discussed with the patient and family. After consideration of risks, benefits and other options for treatment, the patient has consented to  Procedure(s): TRANSESOPHAGEAL ECHOCARDIOGRAM (TEE) (N/A) as a surgical intervention .  The patient's history has been reviewed, patient examined, no change in status, stable for surgery.  I have reviewed the patient's chart and labs.  Questions were answered to the patient's satisfaction.     Parisha Beaulac

## 2016-03-25 ENCOUNTER — Encounter (HOSPITAL_COMMUNITY)
Admission: RE | Disposition: A | Discharge status: Home / Self Care | Payer: Self-pay | Source: Ambulatory Visit | Attending: Internal Medicine

## 2016-03-25 ENCOUNTER — Encounter (HOSPITAL_COMMUNITY): Payer: Self-pay | Admitting: Cardiovascular Disease

## 2016-03-25 ENCOUNTER — Ambulatory Visit (HOSPITAL_COMMUNITY): Payer: BLUE CROSS/BLUE SHIELD | Admitting: Anesthesiology

## 2016-03-25 ENCOUNTER — Ambulatory Visit (HOSPITAL_COMMUNITY)
Admission: RE | Admit: 2016-03-25 | Discharge: 2016-03-26 | Disposition: A | Discharge status: Home / Self Care | Payer: BLUE CROSS/BLUE SHIELD | Source: Ambulatory Visit | Attending: Internal Medicine | Admitting: Internal Medicine

## 2016-03-25 DIAGNOSIS — G4733 Obstructive sleep apnea (adult) (pediatric): Secondary | ICD-10-CM | POA: Insufficient documentation

## 2016-03-25 DIAGNOSIS — Z7901 Long term (current) use of anticoagulants: Secondary | ICD-10-CM | POA: Diagnosis not present

## 2016-03-25 DIAGNOSIS — Z87891 Personal history of nicotine dependence: Secondary | ICD-10-CM | POA: Insufficient documentation

## 2016-03-25 DIAGNOSIS — I5032 Chronic diastolic (congestive) heart failure: Secondary | ICD-10-CM | POA: Insufficient documentation

## 2016-03-25 DIAGNOSIS — F329 Major depressive disorder, single episode, unspecified: Secondary | ICD-10-CM | POA: Diagnosis not present

## 2016-03-25 DIAGNOSIS — I4891 Unspecified atrial fibrillation: Secondary | ICD-10-CM | POA: Diagnosis present

## 2016-03-25 DIAGNOSIS — M1990 Unspecified osteoarthritis, unspecified site: Secondary | ICD-10-CM | POA: Diagnosis not present

## 2016-03-25 DIAGNOSIS — M8580 Other specified disorders of bone density and structure, unspecified site: Secondary | ICD-10-CM | POA: Diagnosis not present

## 2016-03-25 DIAGNOSIS — I110 Hypertensive heart disease with heart failure: Secondary | ICD-10-CM | POA: Insufficient documentation

## 2016-03-25 DIAGNOSIS — Z823 Family history of stroke: Secondary | ICD-10-CM | POA: Insufficient documentation

## 2016-03-25 DIAGNOSIS — E785 Hyperlipidemia, unspecified: Secondary | ICD-10-CM | POA: Insufficient documentation

## 2016-03-25 DIAGNOSIS — F419 Anxiety disorder, unspecified: Secondary | ICD-10-CM | POA: Insufficient documentation

## 2016-03-25 DIAGNOSIS — I483 Typical atrial flutter: Secondary | ICD-10-CM | POA: Insufficient documentation

## 2016-03-25 DIAGNOSIS — Z882 Allergy status to sulfonamides status: Secondary | ICD-10-CM | POA: Diagnosis not present

## 2016-03-25 DIAGNOSIS — Z8249 Family history of ischemic heart disease and other diseases of the circulatory system: Secondary | ICD-10-CM | POA: Insufficient documentation

## 2016-03-25 DIAGNOSIS — I481 Persistent atrial fibrillation: Secondary | ICD-10-CM | POA: Insufficient documentation

## 2016-03-25 DIAGNOSIS — M329 Systemic lupus erythematosus, unspecified: Secondary | ICD-10-CM | POA: Diagnosis not present

## 2016-03-25 DIAGNOSIS — Z6833 Body mass index (BMI) 33.0-33.9, adult: Secondary | ICD-10-CM | POA: Insufficient documentation

## 2016-03-25 DIAGNOSIS — E559 Vitamin D deficiency, unspecified: Secondary | ICD-10-CM | POA: Insufficient documentation

## 2016-03-25 DIAGNOSIS — E669 Obesity, unspecified: Secondary | ICD-10-CM | POA: Insufficient documentation

## 2016-03-25 DIAGNOSIS — K219 Gastro-esophageal reflux disease without esophagitis: Secondary | ICD-10-CM | POA: Insufficient documentation

## 2016-03-25 HISTORY — DX: Dependence on other enabling machines and devices: Z99.89

## 2016-03-25 HISTORY — DX: Low back pain: M54.5

## 2016-03-25 HISTORY — DX: Other local lupus erythematosus: T50.905A

## 2016-03-25 HISTORY — DX: Unspecified asthma, uncomplicated: J45.909

## 2016-03-25 HISTORY — DX: Low back pain, unspecified: M54.50

## 2016-03-25 HISTORY — PX: ATRIAL FIBRILLATION ABLATION: EP1191

## 2016-03-25 HISTORY — DX: Other chronic pain: G89.29

## 2016-03-25 HISTORY — DX: Obstructive sleep apnea (adult) (pediatric): G47.33

## 2016-03-25 HISTORY — DX: Other local lupus erythematosus: L93.2

## 2016-03-25 LAB — POCT ACTIVATED CLOTTING TIME
ACTIVATED CLOTTING TIME: 164 s
ACTIVATED CLOTTING TIME: 175 s
Activated Clotting Time: 285 seconds
Activated Clotting Time: 301 seconds
Activated Clotting Time: 318 seconds

## 2016-03-25 SURGERY — ATRIAL FIBRILLATION ABLATION
Anesthesia: General

## 2016-03-25 MED ORDER — LIDOCAINE HCL (CARDIAC) 20 MG/ML IV SOLN
INTRAVENOUS | Status: DC | PRN
  Administered 2016-03-25: 100 mg via INTRAVENOUS

## 2016-03-25 MED ORDER — ISOPROTERENOL HCL 0.2 MG/ML IJ SOLN
INTRAMUSCULAR | Status: AC
  Filled 2016-03-25: qty 5

## 2016-03-25 MED ORDER — PROTAMINE SULFATE 10 MG/ML IV SOLN
INTRAVENOUS | Status: DC | PRN
  Administered 2016-03-25: 10 mg via INTRAVENOUS

## 2016-03-25 MED ORDER — MENTHOL 3 MG MT LOZG
1.0000 | LOZENGE | OROMUCOSAL | Status: DC | PRN
  Administered 2016-03-25: 3 mg via ORAL
  Filled 2016-03-25: qty 9

## 2016-03-25 MED ORDER — SODIUM CHLORIDE 0.9 % IV SOLN
INTRAVENOUS | Status: DC | PRN
  Administered 2016-03-25 (×2): via INTRAVENOUS

## 2016-03-25 MED ORDER — ACETAMINOPHEN 325 MG PO TABS
650.0000 mg | ORAL_TABLET | ORAL | Status: DC | PRN
  Administered 2016-03-25: 23:00:00 650 mg via ORAL
  Filled 2016-03-25: qty 2

## 2016-03-25 MED ORDER — ONDANSETRON HCL 4 MG/2ML IJ SOLN: 4.0000 mg | Freq: Four times a day (QID) | INTRAMUSCULAR | Status: DC | PRN

## 2016-03-25 MED ORDER — ALPRAZOLAM 0.5 MG PO TABS: 0.5000 mg | ORAL_TABLET | Freq: Three times a day (TID) | ORAL | Status: DC | PRN

## 2016-03-25 MED ORDER — FENTANYL CITRATE (PF) 100 MCG/2ML IJ SOLN
INTRAMUSCULAR | Status: DC | PRN
  Administered 2016-03-25 (×6): 25 ug via INTRAVENOUS
  Administered 2016-03-25: 100 ug via INTRAVENOUS
  Administered 2016-03-25 (×2): 25 ug via INTRAVENOUS

## 2016-03-25 MED ORDER — HEPARIN SODIUM (PORCINE) 1000 UNIT/ML IJ SOLN
INTRAMUSCULAR | Status: DC | PRN
  Administered 2016-03-25: 3000 [IU] via INTRAVENOUS
  Administered 2016-03-25: 1000 [IU] via INTRAVENOUS
  Administered 2016-03-25: 5000 [IU] via INTRAVENOUS

## 2016-03-25 MED ORDER — HEPARIN SODIUM (PORCINE) 1000 UNIT/ML IJ SOLN
INTRAMUSCULAR | Status: DC | PRN
  Administered 2016-03-25 (×2): 1000 [IU] via INTRAVENOUS

## 2016-03-25 MED ORDER — DEXTROSE 5 % IV SOLN
INTRAVENOUS | Status: DC | PRN
  Administered 2016-03-25: 20 ug/min via INTRAVENOUS

## 2016-03-25 MED ORDER — MIDAZOLAM HCL 5 MG/5ML IJ SOLN
INTRAMUSCULAR | Status: DC | PRN
  Administered 2016-03-25: 2 mg via INTRAVENOUS

## 2016-03-25 MED ORDER — HEPARIN SODIUM (PORCINE) 1000 UNIT/ML IJ SOLN
INTRAMUSCULAR | Status: AC
  Filled 2016-03-25: qty 1

## 2016-03-25 MED ORDER — PROPOFOL 10 MG/ML IV BOLUS
INTRAVENOUS | Status: DC | PRN
  Administered 2016-03-25: 50 mg via INTRAVENOUS
  Administered 2016-03-25: 150 mg via INTRAVENOUS

## 2016-03-25 MED ORDER — LIDOCAINE HCL 2 % IJ SOLN: 0.1000 mL | Freq: Once | INTRAMUSCULAR | Status: DC

## 2016-03-25 MED ORDER — SODIUM CHLORIDE 0.9% FLUSH
3.0000 mL | Freq: Two times a day (BID) | INTRAVENOUS | Status: DC
  Administered 2016-03-25: 3 mL via INTRAVENOUS

## 2016-03-25 MED ORDER — PHENYLEPHRINE HCL 10 MG/ML IJ SOLN
INTRAMUSCULAR | Status: DC | PRN
  Administered 2016-03-25 (×2): 40 ug via INTRAVENOUS
  Administered 2016-03-25 (×2): 80 ug via INTRAVENOUS

## 2016-03-25 MED ORDER — SODIUM CHLORIDE 0.9 % IV SOLN: 250.0000 mL | INTRAVENOUS | Status: DC | PRN

## 2016-03-25 MED ORDER — CARVEDILOL 12.5 MG PO TABS
12.5000 mg | ORAL_TABLET | Freq: Two times a day (BID) | ORAL | Status: DC
  Administered 2016-03-25 – 2016-03-26 (×2): 12.5 mg via ORAL
  Filled 2016-03-25 (×2): qty 1

## 2016-03-25 MED ORDER — SODIUM CHLORIDE 0.9 % IV SOLN
INTRAVENOUS | Status: DC
  Administered 2016-03-25: 07:00:00 via INTRAVENOUS

## 2016-03-25 MED ORDER — EPHEDRINE SULFATE 50 MG/ML IJ SOLN
INTRAMUSCULAR | Status: DC | PRN
  Administered 2016-03-25 (×3): 5 mg via INTRAVENOUS
  Administered 2016-03-25: 10 mg via INTRAVENOUS

## 2016-03-25 MED ORDER — BUPIVACAINE HCL (PF) 0.25 % IJ SOLN
INTRAMUSCULAR | Status: DC | PRN
  Administered 2016-03-25: 20 mL

## 2016-03-25 MED ORDER — IOPAMIDOL (ISOVUE-370) INJECTION 76%
INTRAVENOUS | Status: DC | PRN
  Administered 2016-03-25: 3 mL via INTRAVENOUS

## 2016-03-25 MED ORDER — DULOXETINE HCL 30 MG PO CPEP
30.0000 mg | ORAL_CAPSULE | Freq: Every day | ORAL | Status: DC
  Administered 2016-03-25 – 2016-03-26 (×2): 30 mg via ORAL
  Filled 2016-03-25 (×2): qty 1

## 2016-03-25 MED ORDER — AMLODIPINE BESYLATE 5 MG PO TABS
5.0000 mg | ORAL_TABLET | Freq: Every day | ORAL | Status: DC
  Administered 2016-03-25 – 2016-03-26 (×2): 5 mg via ORAL
  Filled 2016-03-25 (×2): qty 1

## 2016-03-25 MED ORDER — IOPAMIDOL (ISOVUE-370) INJECTION 76%
INTRAVENOUS | Status: AC
  Filled 2016-03-25: qty 50

## 2016-03-25 MED ORDER — BUPIVACAINE HCL (PF) 0.25 % IJ SOLN
INTRAMUSCULAR | Status: AC
  Filled 2016-03-25: qty 30

## 2016-03-25 MED ORDER — ISOPROTERENOL HCL 0.2 MG/ML IJ SOLN
INTRAVENOUS | Status: DC | PRN
  Administered 2016-03-25: 20 ug/min via INTRAVENOUS

## 2016-03-25 MED ORDER — OFF THE BEAT BOOK
Freq: Once | Status: AC
  Administered 2016-03-25: 21:00:00
  Filled 2016-03-25: qty 1

## 2016-03-25 MED ORDER — RIVAROXABAN 20 MG PO TABS
20.0000 mg | ORAL_TABLET | Freq: Every day | ORAL | Status: DC
  Administered 2016-03-25: 17:00:00 20 mg via ORAL
  Filled 2016-03-25: qty 1

## 2016-03-25 MED ORDER — SODIUM CHLORIDE 0.9% FLUSH: 3.0000 mL | INTRAVENOUS | Status: DC | PRN

## 2016-03-25 SURGICAL SUPPLY — 18 items
BAG SNAP BAND KOVER 36X36 (MISCELLANEOUS) ×2 IMPLANT
BLANKET WARM UNDERBOD FULL ACC (MISCELLANEOUS) ×2 IMPLANT
CATH NAVISTAR SMARTTOUCH DF (ABLATOR) ×2 IMPLANT
CATH SOUNDSTAR ECO REPROCESSED (CATHETERS) ×2 IMPLANT
CATH VARIABLE LASSO NAV 2515 (CATHETERS) ×2 IMPLANT
CATH WEBSTER BI DIR CS D-F CRV (CATHETERS) ×2 IMPLANT
COVER SWIFTLINK CONNECTOR (BAG) ×2 IMPLANT
NEEDLE TRANSEP BRK 71CM 407200 (NEEDLE) ×2 IMPLANT
PACK EP LATEX FREE (CUSTOM PROCEDURE TRAY) ×1
PACK EP LF (CUSTOM PROCEDURE TRAY) ×1 IMPLANT
PAD DEFIB LIFELINK (PAD) ×2 IMPLANT
PATCH CARTO3 (PAD) ×2 IMPLANT
SHEATH AVANTI 11F 11CM (SHEATH) ×2 IMPLANT
SHEATH PINNACLE 7F 10CM (SHEATH) ×4 IMPLANT
SHEATH PINNACLE 9F 10CM (SHEATH) ×2 IMPLANT
SHEATH SWARTZ TS SL2 63CM 8.5F (SHEATH) ×2 IMPLANT
SHIELD RADPAD SCOOP 12X17 (MISCELLANEOUS) ×2 IMPLANT
TUBING SMART ABLATE COOLFLOW (TUBING) ×2 IMPLANT

## 2016-03-25 NOTE — Interval H&P Note (Signed)
History and Physical Interval Note:  03/25/2016 7:42 AM  Shannon [name]  has presented today for surgery, with the diagnosis of a-fib  The various methods of treatment have been discussed with the patient and family. After consideration of risks, benefits and other options for treatment, the patient has consented to  Procedure(s): Atrial Fibrillation Ablation (N/A) as a surgical intervention .  The patient's history has been reviewed, patient examined, no change in status, stable for surgery.  I have reviewed the patient's chart and labs.  Questions were answered to the patient's satisfaction.    TEE is reviewed.  No LAA thrombus.  Will proceed with ablation.  [name]

## 2016-03-25 NOTE — Anesthesia Postprocedure Evaluation (Signed)
Anesthesia Post Note  Patient: Shannon [name]  Procedure(s) Performed: Procedure(s) (LRB): Atrial Fibrillation Ablation (N/A)  Patient location during evaluation: PACU Anesthesia Type: General Level of consciousness: awake and alert Pain management: pain level controlled Vital Signs Assessment: post-procedure vital signs reviewed and stable Respiratory status: spontaneous breathing, nonlabored ventilation, respiratory function stable and patient connected to nasal cannula oxygen Cardiovascular status: blood pressure returned to baseline and stable Postop Assessment: no signs of nausea or vomiting Anesthetic complications: no       Last Vitals:  Vitals:   03/25/16 1211 03/25/16 1226  BP: (!) 169/74 (!) 156/68  Pulse: 71 68  Resp: 16 16  Temp: (!) 35.5 C     Last Pain:  Vitals:   03/25/16 1140  TempSrc: Tympanic                 Quashawn Jewkes DAVID

## 2016-03-25 NOTE — Progress Notes (Signed)
1,779mL of urine removed via in and out cath due to patient being unable to voluntarily urinate while on bedpan. VS stable and WNLs before sheath pull. Right pedal pulse +2.

## 2016-03-25 NOTE — Anesthesia Preprocedure Evaluation (Addendum)
Anesthesia Evaluation  Patient identified by MRN, date of birth, ID band Patient awake    Reviewed: Allergy & Precautions, H&P , NPO status , Patient's Chart, lab work & pertinent test results  Airway Mallampati: I  TM Distance: >3 FB Neck ROM: Full    Dental  (+) Dental Advidsory Given, Teeth Intact, Missing   Pulmonary sleep apnea and Continuous Positive Airway Pressure Ventilation , former smoker,    Pulmonary exam normal        Cardiovascular hypertension, Pt. on medications +CHF  Normal cardiovascular exam     Neuro/Psych PSYCHIATRIC DISORDERS  Neuromuscular disease    GI/Hepatic GERD  Medicated and Controlled,  Endo/Other    Renal/GU      Musculoskeletal  (+) Arthritis ,   Abdominal   Peds  Hematology   Anesthesia Other Findings   Reproductive/Obstetrics                           Anesthesia Physical Anesthesia Plan  ASA: III  Anesthesia Plan: General   Post-op Pain Management:    Induction: Intravenous  Airway Management Planned: LMA  Additional Equipment:   Intra-op Plan:   Post-operative Plan: Extubation in OR  Informed Consent: I have reviewed the patients History and Physical, chart, labs and discussed the procedure including the risks, benefits and alternatives for the proposed anesthesia with the patient or authorized representative who has indicated his/her understanding and acceptance.   Dental Advisory Given  Plan Discussed with: CRNA and Surgeon  Anesthesia Plan Comments:       Anesthesia Quick Evaluation

## 2016-03-25 NOTE — Progress Notes (Signed)
In and out cath was performed after patient was unable to urinate for over an hour. 1,741mL of urine was removed. 35F, 31F, and 45F venous sheaths were removed at 1328. Hemostasis was achieved at 1340. Pedal pulses palpable at +2. Gauze and tegaderm was placed over sheath removal sites and patient was instructed to keep the right leg straight for 6 hours and to  splint the area for coughing, sneezing, or laughing. Chaney Born, 6C RN was updated on patient's condition and pending arrival. Last VS: BP 159/62, HR 66 in sinus rhythm, RR 20, O2 sat 100%.

## 2016-03-25 NOTE — Progress Notes (Signed)
ACT: 175 at 1147

## 2016-03-25 NOTE — H&P (View-Only) (Signed)
ELECTROPHYSIOLOGY CONSULT NOTE    Patient ID: Shannon [name] MRN: [id number], DOB/AGE: [birth year] 66 y.o.  Admit date: 03/20/2016 Date of Consult: 03/21/2016  Primary Physician: Leeanne Rio, PA-C Primary Electrophysiologist: Leamon Arnt MD: [facility]  Reason for Consultation: atrial fibrillation  HPI:  Shannon [name] is a 66 y.o. female with a past medical history significant for persistent atrial fibrillation, hyperlipidemia, chronic diastolic heart failure, obesity, and OSA.  She has been managed with Flecainide and BB.  With higher doses of BB, she has had symptomatic bradycardia.  She last underwent cardioversion in December of last year and has maintained SR since. Her Metoprolol was changed to Coreg with subsequent worsening fatigue.  Yesterday, she went into AF and took an extra 25mg  of Flecainide followed by another extra 25mg  yesterday evening. She then developed hypotension and pre-syncope for which EMS was called. On their arrival, she was in AF with RVR and hypotensive. She was transferred to [facility] for further evaluation. Her BP improved with fluid resuscitation. She was placed on Dilt drip and converted to SR.  EP has been asked to evaluate for treatment options.  Echo 11/2015 demonstrated normal LVEF, LA 44.  She currently feels much improved. She denies chest pain, shortness of breath, LE edema, recent fevers, chills, nausea or vomiting.   She has been working on lifesytle modification. She is compliant with CPAP, has been working on diet, does not drink ETOH, has cut back on caffeine.  She has not been able to exercise because of back pain (vertebral fracture).   Past Medical History:  Diagnosis Date  . Anxiety   . Arthritis    neck and back  . Degenerative disorder of bone   . Depression   . Diastolic dysfunction   . GERD (gastroesophageal reflux disease)   . Herniated disc, cervical   . Hyperlipidemia   . Hypertension   . Lupus    HCTZ  induced  . 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 Obstructive sleep apnea   . Orthostatic hypotension   . Osteopenia   . PAF (paroxysmal atrial fibrillation) ([place])   . Pinched nerve in neck   . T12 compression fracture ([place]) 11/2015  . Vitamin D deficiency   . Whiplash injury 06/07/2010     Surgical History:  Past Surgical History:  Procedure Laterality Date  . APPENDECTOMY    . arm surgery Left      Prescriptions Prior to Admission  Medication Sig Dispense Refill Last Dose  . acetaminophen (TYLENOL) 650 MG CR tablet Take 1,300 mg by mouth every 8 (eight) hours as needed for pain.   Past Week at Unknown time  . amLODipine (NORVASC) 5 MG tablet Take 1 tablet (5 mg total) by mouth daily.   03/20/2016 at Unknown time  . carvedilol (COREG) 12.5 MG tablet Take 1 tablet (12.5 mg total) by mouth 2 (two) times daily. 180 tablet 1 03/20/2016 at 0900  . Cholecalciferol (VITAMIN D-3 PO) Take 1 capsule by mouth at bedtime.   03/19/2016 at Unknown time  . DULoxetine (CYMBALTA) 30 MG capsule Take 1 capsule (30 mg total) by mouth daily. 30 capsule 5 03/19/2016 at Unknown time  . ergocalciferol (VITAMIN D2) 50000 units capsule Take 1 capsule (50,000 Units total) by mouth every 30 (thirty) days. 4 capsule 3 Past Week at Unknown time  . flecainide (TAMBOCOR) 50 MG tablet Take 1.5 tablets (75 mg total) by mouth 2 (  two) times daily. May take additional 1-2 tablets (50-100 mg) daily as needed for palpitations 120 tablet 3 03/20/2016 at Unknown time  . rivaroxaban (XARELTO) 20 MG TABS tablet Take 1 tablet (20 mg total) by mouth daily with supper. 30 tablet 11 03/19/2016 at 2200  . ALPRAZolam (XANAX) 0.5 MG tablet Take 1 tablet (0.5 mg total) by mouth 3 (three) times daily as needed for anxiety. 30 tablet 1 Unknown at Unknown    Inpatient Medications:  . amLODipine  5 mg Oral Daily  . aspirin EC  81 mg Oral Daily  . carvedilol  12.5 mg Oral BID  . DULoxetine   30 mg Oral Daily  . flecainide  75 mg Oral BID  . rivaroxaban  20 mg Oral Q supper  . sodium chloride flush  3 mL Intravenous Q12H  . Vitamin D (Ergocalciferol)  50,000 Units Oral Q30 days    Allergies:  Allergies  Allergen Reactions  . Ace Inhibitors Cough  . Hctz [Hydrochlorothiazide] Other (See Comments)    Caused drug-induced LUPUS  . Other Other (See Comments)    Unknown anti-inflammatory: Caused extremely aggressive behavior (not Prednisone)  . Oxycodone Other (See Comments)    Hallucinations  . Sulfa Antibiotics Nausea And Vomiting  . Sulfur Nausea And Vomiting  . Voltaren [Diclofenac Sodium] Other (See Comments)    Hypersensitivity    Social History   Social History  . Marital status: Married    Spouse name: N/A  . Number of children: N/A  . Years of education: N/A   Occupational History  . Not on file.   Social History Main Topics  . Smoking status: Former Smoker    Types: Cigarettes    Quit date: 2015  . Smokeless tobacco: Former Systems developer     Comment: Pt started to use vaporizer-uses rarely  . Alcohol use 0.6 oz/week    1 Glasses of wine per week     Comment: social or occationally  . Drug use: No  . Sexual activity: Yes   Other Topics Concern  . Not on file   Social History Narrative   ** Merged History Encounter **         Family History  Problem Relation Age of Onset  . Cancer Mother   . Stroke Father   . Hypertension Father   . Hypertension Maternal Grandmother   . Diabetes Neg Hx      Review of Systems: All other systems reviewed and are otherwise negative except as noted above.  Physical Exam: Vitals:   03/20/16 1800 03/20/16 1838 03/20/16 2129 03/21/16 0543  BP: (!) 141/96 138/73 (!) 106/58 (!) 110/54  Pulse: (!) 123 84 66 (!) 57  Resp: 20 (!) 22 18   Temp:  98.4 F (36.9 C) 98.2 F (36.8 C) 98.2 F (36.8 C)  TempSrc:  Oral Oral Oral  SpO2: 97% 98% 98% 98%  Weight:    216 lb 14.9 oz (98.4 kg)  Height:        GEN- The  patient is obese appearing, alert and oriented x 3 today.   HEENT: normocephalic, atraumatic; sclera clear, conjunctiva pink; hearing intact; oropharynx clear; neck supple  Lungs- Clear to ausculation bilaterally, normal work of breathing.  No wheezes, rales, rhonchi Heart- Regular rate and rhythm, no murmurs, rubs or gallops, PMI not laterally displaced GI- soft, non-tender, non-distended, bowel sounds present, no hepatosplenomegaly Extremities- no clubbing, cyanosis, or edema; DP/PT/radial pulses 2+ bilaterally MS- no significant deformity or atrophy Skin- warm and  dry, no rash or lesion Psych- euthymic mood, full affect Neuro- strength and sensation are intact  Labs:   Lab Results  Component Value Date   WBC 8.4 03/21/2016   HGB 11.3 (L) 03/21/2016   HCT 35.6 (L) 03/21/2016   MCV 88.8 03/21/2016   PLT 312 03/21/2016    Recent Labs Lab 03/20/16 1250 03/21/16 0511  NA 139 138  K 3.9 3.9  CL 105 103  CO2 25 27  BUN 8 12  CREATININE 0.73 0.99  CALCIUM 9.9 9.4  PROT 7.8  --   BILITOT 0.4  --   ALKPHOS 118  --   ALT 23  --   AST 23  --   GLUCOSE 101* 103*      Radiology/Studies: Dg Chest 2 View Result Date: 03/20/2016 CLINICAL DATA:  Atrial fibrillation. Shortness of breath and dizziness. EXAM: CHEST  2 VIEW COMPARISON:  None. FINDINGS: Lungs are clear. Heart is upper normal in size with pulmonary vascularity within normal limits. No adenopathy. There is atherosclerotic calcification in the aorta. There is anterior wedging of the L1 vertebral body. IMPRESSION: No edema or consolidation. Age uncertain anterior wedging of the L1 vertebral body. Aortic atherosclerosis. No adenopathy. Electronically Signed   By: Lowella Grip III M.D.   On: 03/20/2016 13:11    EKG: sinus brady, 57, TWI (personally reviewed)  TELEMETRY:  Sinus brady, sinus rhythm (personally reviewed)  Assessment/Plan: 1.  Persistent atrial fibrillation The patient has symptomatic persistent atrial  fibrillation and has failed Flecainide. Other AAD options are limited 2/2 symptomatic bradycardia. We have discussed Tikosyn or ablation as options today.  She is leaning towards ablation but would like to discuss further with Dr Rayann Heman She reports compliance with Xarelto for last 4 weeks with no missed doses. Continue Xarelto long term for CHADS2VASC of 2  2.  HTN Stable No change required today  3.  Obesity Body mass index is 33.98 kg/m. Weight loss advised We have discussed walking in pool at nearby [facility]  4. OSA She is compliant with CPAP    Signed, Chanetta Marshall, NP 03/21/2016 8:58 AM   I have seen, examined the patient, and reviewed the above assessment and plan.  On exam, overweight, RRR.  Changes to above are made where necessary.  She has persistent afib and has failed medical therapy with flecainide.  Given bradycardia, medical options include tikosyn and amiodarone. Therapeutic strategies for afib including medicine and ablation were discussed in detail with the patient today. Risk, benefits, and alternatives to EP study and radiofrequency ablation for afib were also discussed in detail today. These risks include but are not limited to stroke, bleeding, vascular damage, tamponade, perforation, damage to the esophagus, lungs, and other structures, pulmonary vein stenosis, worsening renal function, and death. The patient understands these risk and wishes to proceed.  We will therefore proceed with catheter ablation at the next available time. She is scheduled for TEE on Monday and ablation this coming Tuesday. Compliance with xarelto in the interim is encouraged.  OK to discharge to home at this time.   Co Sign: [name], MD 03/21/2016 12:23 PM

## 2016-03-25 NOTE — Transfer of Care (Signed)
Immediate Anesthesia Transfer of Care Note  Patient: Shannon [name]  Procedure(s) Performed: Procedure(s): Atrial Fibrillation Ablation (N/A)  Patient Location: Cath Lab  Anesthesia Type:General  Level of Consciousness: awake, alert  and oriented  Airway & Oxygen Therapy: Patient Spontanous Breathing and Patient connected to nasal cannula oxygen  Post-op Assessment: Report given to RN, Post -op Vital signs reviewed and stable and Patient moving all extremities X 4  Post vital signs: Reviewed and stable  Last Vitals:  Vitals:   03/25/16 0612  BP: (!) 149/71  Pulse: (!) 56  Resp: 18  Temp: 37.1 C    Last Pain:  Vitals:   03/25/16 0612  TempSrc: Oral      Patients Stated Pain Goal: 3 (68/34/19 6222)  Complications: No apparent anesthesia complications

## 2016-03-25 NOTE — Care Management Note (Signed)
Case Management Note  Patient Details  Name: Shannon [name] MRN: [id number] Date of Birth: May 19, 1950  Subjective/Objective:   s/p afib ablation, patient is already on xarelto on home meds,  NCM will cont to follow for dc needs.                  Action/Plan:   Expected Discharge Date:                  Expected Discharge Plan:  Home/Self Care  In-House Referral:     Discharge planning Services  CM Consult  Post Acute Care Choice:    Choice offered to:     DME Arranged:    DME Agency:     HH Arranged:    HH Agency:     Status of Service:  In process, will continue to follow  If discussed at Long Length of Stay Meetings, dates discussed:    Additional Comments:  Zenon Mayo, RN 03/25/2016, 3:21 PM

## 2016-03-25 NOTE — Progress Notes (Signed)
[id number]

## 2016-03-25 NOTE — Discharge Summary (Signed)
ELECTROPHYSIOLOGY PROCEDURE DISCHARGE SUMMARY    Patient ID: Shannon [name],  MRN: [id number], DOB/AGE: May 22, 1950 66 y.o.  Admit date: 03/25/2016 Discharge date: 03/26/16  Primary Care Physician: Leeanne Rio, PA-C Electrophysiologist: Lovena Le  Primary Discharge Diagnosis:  Persistent atrial fibrillation and atrial flutter status post ablation this admission  Secondary Discharge Diagnosis:  1.  Hyperlipidemia 2.  Chronic diastolic heart failure 3.  Obesity 4.  OSA  Procedures This Admission:  1.  Electrophysiology study and radiofrequency catheter ablation on 03/25/16 by Dr [name].  This study demonstrated sinus rhythm upon presentation; intracardiac echo reveals a moderately enlarged left atrium with four separate pulmonary veins without evidence of pulmonary vein stenosis; successful electrical isolation and anatomical encircling of all four pulmonary veins with radiofrequency current; cavo-tricuspid isthmus ablation was performed with complete bidirectional isthmus block achieved; additional ablation performed at the SVC/RA junction; no inducible arrhythmias following ablation both on and off of Isuprel; no early apparent complications..    Brief HPI: Shannon [name] is a 66 y.o. female with a history of persistent atrial fibrillation.  They have failed medical therapy with flecainide. Risks, benefits, and alternatives to catheter ablation of atrial fibrillation were reviewed with the patient who wished to proceed.  The patient underwent TEE prior to the procedure which demonstrated normal LV function and no LAA thrombus.    Hospital Course:  The patient was admitted and underwent EPS/RFCA of atrial fibrillation with details as outlined above.  They were monitored on telemetry overnight which demonstrated SR, 70's.  Groin was without complication on the day of discharge.  The patient was examined and considered to be stable for discharge.  Wound care and  activity restrictions were reviewed with the patient.  The patient will be seen back by Roderic Palau, NP in 4 weeks and Dr Rayann Heman in 12 weeks for post ablation follow up.   This patients CHA2DS2-VASc Score and unadjusted Ischemic Stroke Rate (% per year) is equal to 2.2 % stroke rate/year from a score of 2 Above score calculated as 1 point each if present [CHF, HTN, DM, Vascular=MI/PAD/Aortic Plaque, Age if 65-74, or Female] Above score calculated as 2 points each if present [Age > 75, or Stroke/TIA/TE]   Physical Exam: Vitals:   03/25/16 1949 03/25/16 2000 03/26/16 0616 03/26/16 0700  BP: (!) 129/59  (!) 155/66 113/63  Pulse: 65  76 75  Resp: (!) 25 (!) 21 (!) 25 19  Temp: 98.7 F (37.1 C)  99.2 F (37.3 C) 99.5 F (37.5 C)  TempSrc: Oral  Oral Oral  SpO2: 97%  95% 94%  Weight:   214 lb (97.1 kg)   Height:        GEN- The patient is well appearing, alert and oriented x 3 today.   HEENT: normocephalic, atraumatic; sclera clear, conjunctiva pink; hearing intact; oropharynx clear; neck supple  Lungs- CTA b/l, normal work of breathing.  No wheezes, rales, rhonchi Heart- RRR, no murmurs, no rubs or gallops  GI- soft, non-tender, non-distended, bowel sounds present  Extremities- no clubbing, cyanosis, or edema; DP/PT/radial pulses 2+ bilaterally, groin without hematoma/bruit MS- no significant deformity or atrophy Skin- warm and dry, no rash or lesion Psych- euthymic mood, full affect Neuro- strength and sensation are intact   Labs:   Lab Results  Component Value Date   WBC 8.4 03/21/2016   HGB 11.3 (L) 03/21/2016   HCT 35.6 (L) 03/21/2016   MCV 88.8 03/21/2016   PLT 312 03/21/2016  Recent Labs Lab 03/20/16 1250 03/21/16 0511  NA 139 138  K 3.9 3.9  CL 105 103  CO2 25 27  BUN 8 12  CREATININE 0.73 0.99  CALCIUM 9.9 9.4  PROT 7.8  --   BILITOT 0.4  --   ALKPHOS 118  --   ALT 23  --   AST 23  --   GLUCOSE 101* 103*     Discharge Medications:  Allergies  as of 03/26/2016      Reactions   Ace Inhibitors Cough   Hctz [hydrochlorothiazide] Other (See Comments)   Caused drug-induced LUPUS   Other Other (See Comments)   Unknown anti-inflammatory: Caused extremely aggressive behavior (not Prednisone)   Oxycodone Other (See Comments)   Hallucinations   Sulfa Antibiotics Nausea And Vomiting   Sulfur Nausea And Vomiting   Voltaren [diclofenac Sodium] Other (See Comments)   Hypersensitivity      Medication List    TAKE these medications   acetaminophen 650 MG CR tablet Commonly known as:  TYLENOL Take 1,300 mg by mouth every 8 (eight) hours as needed for pain.   ALPRAZolam 0.5 MG tablet Commonly known as:  XANAX Take 1 tablet (0.5 mg total) by mouth 3 (three) times daily as needed for anxiety.   amLODipine 5 MG tablet Commonly known as:  NORVASC Take 1 tablet (5 mg total) by mouth daily.   carvedilol 12.5 MG tablet Commonly known as:  COREG Take 1 tablet (12.5 mg total) by mouth 2 (two) times daily.   DULoxetine 30 MG capsule Commonly known as:  CYMBALTA Take 1 capsule (30 mg total) by mouth daily.   ergocalciferol 50000 units capsule Commonly known as:  VITAMIN D2 Take 1 capsule (50,000 Units total) by mouth every 30 (thirty) days.   flecainide 50 MG tablet Commonly known as:  TAMBOCOR Take 1.5 tablets (75 mg total) by mouth 2 (two) times daily. May take additional 1-2 tablets (50-100 mg) daily as needed for palpitations   pantoprazole 40 MG tablet Commonly known as:  PROTONIX Take 1 tablet (40 mg total) by mouth daily.   rivaroxaban 20 MG Tabs tablet Commonly known as:  XARELTO Take 1 tablet (20 mg total) by mouth daily with supper.   VITAMIN D-3 PO Take 1 capsule by mouth at bedtime.       Disposition: Home Discharge Instructions    Diet - low sodium heart healthy    Complete by:  As directed    Increase activity slowly    Complete by:  As directed      Follow-up Information    [facility] Follow up on 04/22/2016.   Specialty:  Cardiology Why:  at 11:30AM  Contact information: [address] [id number] [place] [address] [place], [place] Follow up on 06/30/2016.   Specialty:  Cardiology Why:  at 11:45AM Contact information: [place] [address] [phone]           Duration of Discharge Encounter: Greater than 30 minutes including physician time.  Signed, [name], PA-C 03/26/2016 10:37 AM   I have seen, examined the patient, and reviewed the above assessment and plan.  On exam, RRR.  Groin without hematoma/ bruit.   Changes to above are made where necessary.  DC to home.  Co Sign: [name], MD 03/26/2016 10:38 AM

## 2016-03-25 NOTE — Anesthesia Procedure Notes (Signed)
Procedure Name: LMA Insertion Date/Time: 03/25/2016 8:00 AM Performed by: Neldon Newport Pre-anesthesia Checklist: Timeout performed, Patient being monitored, Suction available, Emergency Drugs available and Patient identified Patient Re-evaluated:Patient Re-evaluated prior to inductionOxygen Delivery Method: Circle system utilized Preoxygenation: Pre-oxygenation with 100% oxygen Intubation Type: IV induction Ventilation: Mask ventilation without difficulty LMA: LMA inserted LMA Size: 4.0 Tube type: Oral Number of attempts: 1 Placement Confirmation: breath sounds checked- equal and bilateral and positive ETCO2 Tube secured with: Tape Dental Injury: Teeth and Oropharynx as per pre-operative assessment

## 2016-03-26 ENCOUNTER — Encounter (HOSPITAL_COMMUNITY): Payer: Self-pay | Admitting: Internal Medicine

## 2016-03-26 DIAGNOSIS — E785 Hyperlipidemia, unspecified: Secondary | ICD-10-CM | POA: Diagnosis not present

## 2016-03-26 DIAGNOSIS — I4891 Unspecified atrial fibrillation: Secondary | ICD-10-CM | POA: Diagnosis not present

## 2016-03-26 DIAGNOSIS — I483 Typical atrial flutter: Secondary | ICD-10-CM | POA: Diagnosis not present

## 2016-03-26 DIAGNOSIS — I481 Persistent atrial fibrillation: Secondary | ICD-10-CM | POA: Diagnosis not present

## 2016-03-26 DIAGNOSIS — I5032 Chronic diastolic (congestive) heart failure: Secondary | ICD-10-CM | POA: Diagnosis not present

## 2016-03-26 MED ORDER — PANTOPRAZOLE SODIUM 40 MG PO TBEC: 40.0000 mg | DELAYED_RELEASE_TABLET | Freq: Every day | ORAL | 0 refills | Status: DC

## 2016-03-26 NOTE — Care Management Note (Signed)
Case Management Note  Patient Details  Name: Janira Mandell MRN: [id number] Date of Birth: 06/22/1950  Subjective/Objective:   s/p afib ablation, patient is for dc today, no needs.                  Action/Plan:   Expected Discharge Date:  03/26/16               Expected Discharge Plan:  Home/Self Care  In-House Referral:     Discharge planning Services  CM Consult  Post Acute Care Choice:    Choice offered to:     DME Arranged:    DME Agency:     HH Arranged:    HH Agency:     Status of Service:  Completed, signed off  If discussed at [employer] of Stay Meetings, dates discussed:    Additional Comments:  Zenon Mayo, RN 03/26/2016, 11:47 AM

## 2016-03-26 NOTE — Discharge Instructions (Signed)
No driving for 4 days. No lifting over 5 lbs for 1 week. No vigorous or sexual activity for 1 week. You may return to work in 1 week. Keep procedure site clean & dry. If you notice increased pain, swelling, bleeding or pus, call/return!  You may shower, but no soaking baths/hot tubs/pools for 1 week.   You have an appointment set up with the [facility].  Multiple studies have shown that being followed by a dedicated atrial fibrillation clinic in addition to the standard care you receive from your other physicians improves health. We believe that enrollment in the atrial fibrillation clinic will allow [place] to better care for you.   The phone number to the [facility] is [phone]. The clinic is staffed Monday through Friday from 8:30am to 5pm.  Parking Directions: The clinic is located in the Heart and Vascular Building connected to [facility]. 1)From [address] turn on to [address] and go to the 3rd entrance  (Heart and Vascular entrance) on the right. 2)Look to the right for Heart &Vascular Parking Garage. 3)A code for the entrance is required please call the clinic to receive this.   4)Take the elevators to the 1st floor. Registration is in the room with the glass walls at the end of the hallway.  If you have any trouble parking or locating the clinic, please dont hesitate to call [phone].

## 2016-03-26 NOTE — Progress Notes (Signed)
Patient has home CPAP and was on when RT entered room. Patient tolerating well at this time. RT will continue to monitor.

## 2016-03-30 ENCOUNTER — Other Ambulatory Visit: Payer: Self-pay | Admitting: Physician Assistant

## 2016-04-03 ENCOUNTER — Telehealth: Payer: Self-pay | Admitting: Internal Medicine

## 2016-04-03 ENCOUNTER — Emergency Department (HOSPITAL_COMMUNITY)
Admission: EM | Admit: 2016-04-03 | Discharge: 2016-04-04 | Disposition: A | Discharge status: Home / Self Care | Payer: BLUE CROSS/BLUE SHIELD | Attending: Emergency Medicine | Admitting: Emergency Medicine

## 2016-04-03 ENCOUNTER — Encounter (HOSPITAL_COMMUNITY): Payer: Self-pay | Admitting: Emergency Medicine

## 2016-04-03 DIAGNOSIS — Z87891 Personal history of nicotine dependence: Secondary | ICD-10-CM | POA: Diagnosis not present

## 2016-04-03 DIAGNOSIS — J45909 Unspecified asthma, uncomplicated: Secondary | ICD-10-CM | POA: Insufficient documentation

## 2016-04-03 DIAGNOSIS — I10 Essential (primary) hypertension: Secondary | ICD-10-CM | POA: Diagnosis not present

## 2016-04-03 DIAGNOSIS — Z7901 Long term (current) use of anticoagulants: Secondary | ICD-10-CM | POA: Insufficient documentation

## 2016-04-03 DIAGNOSIS — R1031 Right lower quadrant pain: Secondary | ICD-10-CM | POA: Diagnosis not present

## 2016-04-03 LAB — COMPREHENSIVE METABOLIC PANEL
ALBUMIN: 3.8 g/dL (ref 3.5–5.0)
ALT: 15 U/L (ref 14–54)
ANION GAP: 9 (ref 5–15)
AST: 17 U/L (ref 15–41)
Alkaline Phosphatase: 103 U/L (ref 38–126)
BUN: 13 mg/dL (ref 6–20)
CHLORIDE: 101 mmol/L (ref 101–111)
CO2: 27 mmol/L (ref 22–32)
Calcium: 9.6 mg/dL (ref 8.9–10.3)
Creatinine, Ser: 0.83 mg/dL (ref 0.44–1.00)
GFR calc Af Amer: >60 mL/min (ref >60)
GFR calc non Af Amer: >60 mL/min (ref >60)
GLUCOSE: 107 mg/dL — AB (ref 65–99)
Potassium: 3.8 mmol/L (ref 3.5–5.1)
Sodium: 137 mmol/L (ref 135–145)
Total Bilirubin: 0.6 mg/dL (ref 0.3–1.2)
Total Protein: 7.1 g/dL (ref 6.5–8.1)

## 2016-04-03 LAB — CBC WITH DIFFERENTIAL/PLATELET
BASOS ABS: 0 10*3/uL (ref 0.0–0.1)
BASOS PCT: 0 %
EOS ABS: 0.3 10*3/uL (ref 0.0–0.7)
Eosinophils Relative: 4 %
HCT: 32.8 % — ABNORMAL LOW (ref 36.0–46.0)
Hemoglobin: 10.5 g/dL — ABNORMAL LOW (ref 12.0–15.0)
LYMPHS PCT: 25 %
Lymphs Abs: 2.4 10*3/uL (ref 0.7–4.0)
MCH: 27.9 pg (ref 26.0–34.0)
MCHC: 32 g/dL (ref 30.0–36.0)
MCV: 87.2 fL (ref 78.0–100.0)
Monocytes Absolute: 0.8 10*3/uL (ref 0.1–1.0)
Monocytes Relative: 9 %
NEUTROS PCT: 62 %
Neutro Abs: 5.8 10*3/uL (ref 1.7–7.7)
Platelets: 390 10*3/uL (ref 150–400)
RBC: 3.76 MIL/uL — ABNORMAL LOW (ref 3.87–5.11)
RDW: 14.9 % (ref 11.5–15.5)
WBC: 9.4 10*3/uL (ref 4.0–10.5)

## 2016-04-03 LAB — PROTIME-INR
INR: 1.22
Prothrombin Time: 15.5 seconds — ABNORMAL HIGH (ref 11.4–15.2)

## 2016-04-03 LAB — I-STAT CG4 LACTIC ACID, ED: LACTIC ACID, VENOUS: 0.61 mmol/L (ref 0.5–1.9)

## 2016-04-03 MED ORDER — CARVEDILOL 12.5 MG PO TABS
12.5000 mg | ORAL_TABLET | Freq: Once | ORAL | Status: AC
  Administered 2016-04-03: 12.5 mg via ORAL
  Filled 2016-04-03: qty 1

## 2016-04-03 MED ORDER — RIVAROXABAN 20 MG PO TABS
20.0000 mg | ORAL_TABLET | Freq: Once | ORAL | Status: AC
  Administered 2016-04-03: 20 mg via ORAL
  Filled 2016-04-03: qty 1

## 2016-04-03 MED ORDER — ACETAMINOPHEN 325 MG PO TABS
650.0000 mg | ORAL_TABLET | Freq: Once | ORAL | Status: AC
  Administered 2016-04-03: 650 mg via ORAL
  Filled 2016-04-03: qty 2

## 2016-04-03 NOTE — Telephone Encounter (Signed)
New message      Pt recently had a cath.  Pt is in excruciating pain at the groin site.  Please advise

## 2016-04-03 NOTE — ED Triage Notes (Signed)
Pt. Stated, I had a cardiac cath 7 days ago and in the site started hurting really sharpe. Its releived with ice and some Tylenol last night.

## 2016-04-03 NOTE — Telephone Encounter (Signed)
Shannon [name] is calling because she had a procedure las week and was told to call if she has any increased pain . She states that the pain was very intense on last night and she took some Tylenol and it eased up some . Please call

## 2016-04-03 NOTE — Telephone Encounter (Signed)
I called and spoke to patient. She states last night her incision from cath was extremely painful. She states incision is in groin.  She states she took APAP and the pain went away.  She reports the incision is fine today, quarter sized brusing w/ pea sized knot at incision site.  Patient denies pain, swelling, and heat at site at this time. She states she is much better today, just wanted to report pain last night.  I advised her pea sized knot w/ quarter sized brusing is WNL after this procedure, but she should not have increased pain at this time.  I advised if incision becomes painful, swollen, or hot to touch call back to office immediately. She voiced understanding, agreed with plan, and voiced thanks for call.

## 2016-04-03 NOTE — Telephone Encounter (Signed)
Patient's husband, [name], is calling.  He states patient has started having excruciating pain at groin cath site.  He states she has cried out in pain. I advised him this is not WNL post cath and to take patient straight to [facility] ED.  He voiced understanding and thanks.

## 2016-04-03 NOTE — ED Provider Notes (Signed)
[facility] DEPT Provider Note   CSN: [id number] Arrival date & time: 04/03/16  1708     History   Chief Complaint Chief Complaint  Patient presents with  . Groin Pain    HPI Shannon [name] is a 66 y.o. female with a hx of Anxiety, asthma, chronic low back pain contrary to compression fracture, A. fib, GERD, hypertension, hyperlipidemia, presents to the Emergency Department 9 days after cardiac cath for A. fib ablation by Dr. Rayann Heman on 03/25/2016 complaining of gradual, persistent, improving right groin pain onset this afternoon around 3 PM. Patient reports she was feeling better today and when out running errands and lunch after which her right groin began to give her pain. She reports applying ice, taking Tylenol and resting with improvement. She denies swelling or bruising. She's had no drainage from the site. No erythema or induration.  She denies fevers or chills. She has been compliant with her Xarelto.  The history is provided by the patient and medical records. No language interpreter was used.    Past Medical History:  Diagnosis Date  . Anxiety   . Arthritis    "neck and lower back" (03/25/2016)  . Asthma 1990s X 1   "short term inhaler use"   . Chronic lower back pain   . Degenerative disorder of bone   . Depression   . Diastolic dysfunction   . Drug-induced lupus erythematosus    HCTZ induced; "still gettin over it" (03/25/2016)  . GERD (gastroesophageal reflux disease)   . Herniated disc, cervical   . Hyperlipidemia   . Hypertension   . 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 Orthostatic hypotension   . OSA on CPAP   . Osteopenia   . PAF (paroxysmal atrial fibrillation) ([place])   . Pinched nerve in neck   . T12 compression fracture ([place]) 11/2015  . Vitamin D deficiency   . Whiplash injury 06/07/2010    Patient Active Problem List   Diagnosis Date Noted  . A-fib ([place]) 03/25/2016  . Typical atrial  flutter ([place])   . Paroxysmal atrial fibrillation with RVR ([place]) 03/20/2016  . Chronic diastolic CHF (congestive heart failure) ([place]) 12/08/2015  . Elevated troponin 11/24/2015  . Atrial fibrillation with RVR ([place])   . Thoracic compression fracture ([place]) 11/16/2015  . Orthostatic hypotension 11/15/2015  . Osteopenia 09/12/2015  . Vitamin D deficiency 08/29/2015  . OSA (obstructive sleep apnea) 03/04/2015  . Polymyalgia ([place]) 11/22/2014  . Essential hypertension 08/21/2014  . Anxiety and depression 08/21/2014  . Hip stiffness 08/21/2014    Past Surgical History:  Procedure Laterality Date  . APPENDECTOMY  1990s  . ATRIAL FIBRILLATION ABLATION N/A 03/25/2016   Procedure: Atrial Fibrillation Ablation;  Surgeon: [name], MD;  Location: [facility];  Service: Cardiovascular;  Laterality: N/A;  . FOREARM FRACTURE SURGERY Left ~ 02/2011   "broke arm; shattered wrist"  . FOREARM HARDWARE REMOVAL Right ~ 07/2011  . FRACTURE SURGERY    . TEE WITHOUT CARDIOVERSION N/A 03/24/2016   Procedure: TRANSESOPHAGEAL ECHOCARDIOGRAM (TEE);  Surgeon: Sanda Klein, MD;  Location: [facility] ENDOSCOPY;  Service: Cardiovascular;  Laterality: N/A;    OB History    No data available       Home Medications    Prior to Admission medications   Medication Sig Start Date End Date Taking? Authorizing Provider  acetaminophen (TYLENOL) 650 MG CR tablet Take 650-1,300 mg by mouth every 8 (eight) hours  as needed for pain.    Yes Historical Provider, MD  amLODipine (NORVASC) 5 MG tablet Take 1 tablet (5 mg total) by mouth daily. 01/21/16  Yes Will Meredith Leeds, MD  carvedilol (COREG) 12.5 MG tablet Take 1 tablet (12.5 mg total) by mouth 2 (two) times daily. 01/21/16 04/20/16 Yes Will Meredith Leeds, MD  Cholecalciferol (VITAMIN D-3 PO) Take 1 tablet by mouth at bedtime.    Yes Historical Provider, MD  DULoxetine (CYMBALTA) 30 MG capsule TAKE ONE CAPSULE BY MOUTH ONCE DAILY Patient taking differently: Take 30 mg  by mouth at bedtime 03/31/16  Yes Brunetta Jeans, PA-C  ergocalciferol (VITAMIN D2) 50000 units capsule Take 1 capsule (50,000 Units total) by mouth every 30 (thirty) days. 08/29/15  Yes Brunetta Jeans, PA-C  flecainide (TAMBOCOR) 50 MG tablet Take 1.5 tablets (75 mg total) by mouth 2 (two) times daily. May take additional 1-2 tablets (50-100 mg) daily as needed for palpitations 01/16/16  Yes Evans Lance, MD  pantoprazole (PROTONIX) 40 MG tablet Take 1 tablet (40 mg total) by mouth daily. Patient taking differently: Take 40 mg by mouth at bedtime.  03/26/16  Yes Renee Dyane Dustman, PA-C  rivaroxaban (XARELTO) 20 MG TABS tablet Take 1 tablet (20 mg total) by mouth daily with supper. Patient taking differently: Take 20 mg by mouth at bedtime.  01/29/16  Yes Evans Lance, MD  ALPRAZolam Duanne Moron) 0.5 MG tablet Take 1 tablet (0.5 mg total) by mouth 3 (three) times daily as needed for anxiety. Patient not taking: Reported on 04/03/2016 12/24/15   Brunetta Jeans, PA-C    Family History Family History  Problem Relation Age of Onset  . Cancer Mother   . Stroke Father   . Hypertension Father   . Hypertension Maternal Grandmother   . Diabetes Neg Hx     Social History Social History  Substance Use Topics  . Smoking status: Former Smoker    Packs/day: 0.50    Years: 44.00    Types: Cigarettes, E-cigarettes    Quit date: 09/06/2013  . Smokeless tobacco: Never Used  . Alcohol use Yes     Comment: 03/25/2016 "nothing for a couple years now; was having a drink on anniversary and Christmas"     Allergies   Ace inhibitors; Hctz [hydrochlorothiazide]; Oxycodone; Prednisone; Sulfa antibiotics; Sulfur; and Voltaren [diclofenac sodium]   Review of Systems Review of Systems  Musculoskeletal:       R groin pain  All other systems reviewed and are negative.    Physical Exam Updated Vital Signs BP (!) 170/68   Pulse 75   Temp 99 F (37.2 C) (Oral)   Resp 14   SpO2 99%   Physical Exam    Constitutional: She appears well-developed and well-nourished. No distress.  Awake, alert, nontoxic appearance  HENT:  Head: Normocephalic and atraumatic.  Mouth/Throat: Oropharynx is clear and moist. No oropharyngeal exudate.  Eyes: Conjunctivae are normal. No scleral icterus.  Neck: Normal range of motion. Neck supple.  Cardiovascular: Normal rate, regular rhythm and intact distal pulses.   Pulses:      Radial pulses are 2+ on the right side, and 2+ on the left side.       Dorsalis pedis pulses are 2+ on the right side, and 2+ on the left side.       Posterior tibial pulses are 2+ on the right side, and 2+ on the left side.  Pulmonary/Chest: Effort normal and breath sounds normal. No respiratory  distress. She has no wheezes.  Equal chest expansion  Abdominal: Soft. Bowel sounds are normal. She exhibits no mass. There is no tenderness. There is no rebound and no guarding.  Musculoskeletal: Normal range of motion. She exhibits no edema.  Mild TTP to the right groin.  No lymphadenopathy. Cannulation site is clean and healing well. No erythema, induration or swelling. No fluctuance. No pulsatile mass palpable. Full range of motion of the right hip knee and ankle. No swelling or erythema to the right lower leg. No weakness. No calf tenderness.  Neurological: She is alert.  Speech is clear and goal oriented Moves extremities without ataxia  Skin: Skin is warm and dry. She is not diaphoretic.  Psychiatric: She has a normal mood and affect.  Nursing note and vitals reviewed.    ED Treatments / Results  Labs (all labs ordered are listed, but only abnormal results are displayed) Labs Reviewed  CBC WITH DIFFERENTIAL/PLATELET - Abnormal; Notable for the following:       Result Value   RBC 3.76 (*)    Hemoglobin 10.5 (*)    HCT 32.8 (*)    All other components within normal limits  COMPREHENSIVE METABOLIC PANEL  PROTIME-INR  I-STAT CG4 LACTIC ACID, ED    EKG  EKG  Interpretation None       Radiology No results found.  Procedures Procedures (including critical care time)  EMERGENCY DEPARTMENT US EXTREMITY EXAM "Study:  Limited evaluation of right groin for pseudoaneurysm post cannulation for cath"  INDICATIONS: groin pain Visualization of  regions in transverse plane with full compression visualized.   PERFORMED BY: Myself and Dr. Sherry Ruffing IMAGES ARCHIVED?: Yes VIEWS USED: Saphenous-femoral junction INTERPRETATION: No DVT visualized and no visualization of large fluid collection or pseudoaneurysm   Medications Ordered in ED Medications  acetaminophen (TYLENOL) tablet 650 mg (not administered)  rivaroxaban (XARELTO) tablet 20 mg (not administered)  carvedilol (COREG) tablet 12.5 mg (not administered)     Initial Impression / Assessment and Plan / ED Course  I have reviewed the triage vital signs and the nursing notes.  Pertinent labs & imaging results that were available during my care of the patient were reviewed by me and considered in my medical decision making (see chart for details).  Clinical Course as of Apr 04 2339  Thu Apr 03, 2016  2329 The patient was discussed with and seen by Dr. Sherry Ruffing who agrees with the treatment plan.   [HM]  2329 Pt noted to be hypertensive.  No HA, CP, SOB.  No signs of hypertensive urgency.  Pt with Hx of same and has not had her evening medication dose.  Home medications given. BP: (!) 170/68 [HM]    Clinical Course User Index [HM] Tameeka Luo, PA-C    Pt presents with right groin pain 9 days after cath.  Bedside US without Pseudoaneurysm, compressible. Clinical signs and symptoms are not supportive or suggestive of infection. Labs reassuring.  Slight drop in hemoglobin from 11.3 on 03/21/2016 to 10.5 today.  No leukocytosis. Normal lactic acid. No signs of infection at the site. Patient is to follow with her primary care physician and cardiology. She is to return here for new or  worsening symptoms including swelling, redness, fevers or other concerns. Patient and husband state understanding and are in agreement with the plan.  BP (!) 159/70   Pulse 64   Temp 99 F (37.2 C) (Oral)   Resp 14   SpO2 98%    Final Clinical  Impressions(s) / ED Diagnoses   Final diagnoses:  Right inguinal pain    New Prescriptions New Prescriptions   No medications on file     Abigail Butts, PA-C 04/04/16 0037    Gwenyth Allegra Tegeler, MD 04/08/16 [phone]

## 2016-04-04 MED ORDER — HYDROCODONE-ACETAMINOPHEN 5-325 MG PO TABS: 1.0000 | ORAL_TABLET | Freq: Three times a day (TID) | ORAL | 0 refills | Status: DC | PRN

## 2016-04-04 NOTE — Discharge Instructions (Signed)
1. Medications: vicodin for severe pain - do not combine with other pain medications or additional tylenol, usual home medications 2. Treatment: rest, drink plenty of fluids, ice, gentle stretching 3. Follow Up: Please followup with your primary doctor in 1 day for discussion of your diagnoses and further evaluation after today's visit; if you do not have a primary care doctor use the resource guide provided to find one; Please return to the ER for swelling, bruising, increasing pain, fever, redness, or other concerns.

## 2016-04-05 NOTE — Telephone Encounter (Signed)
Pt seen in ED and [place] was normal. Typically, post AF ablation complications are best evaulated by EP MD or AFib clinic rather than ED. Will have AF clinic nurse follow-up with patient.

## 2016-04-07 NOTE — Telephone Encounter (Signed)
Left message to return my call to check on patient per Dr. Rayann Heman request.

## 2016-04-22 ENCOUNTER — Encounter (HOSPITAL_COMMUNITY): Payer: Self-pay | Admitting: Nurse Practitioner

## 2016-04-22 ENCOUNTER — Ambulatory Visit (HOSPITAL_COMMUNITY)
Admission: RE | Admit: 2016-04-22 | Discharge: 2016-04-22 | Disposition: A | Discharge status: Home / Self Care | Payer: BLUE CROSS/BLUE SHIELD | Source: Ambulatory Visit | Attending: Nurse Practitioner | Admitting: Nurse Practitioner

## 2016-04-22 VITALS — BP 120/68 | HR 55 | Ht 67.0 in | Wt 215.2 lb

## 2016-04-22 DIAGNOSIS — J45909 Unspecified asthma, uncomplicated: Secondary | ICD-10-CM | POA: Insufficient documentation

## 2016-04-22 DIAGNOSIS — G4733 Obstructive sleep apnea (adult) (pediatric): Secondary | ICD-10-CM | POA: Diagnosis not present

## 2016-04-22 DIAGNOSIS — Z87891 Personal history of nicotine dependence: Secondary | ICD-10-CM | POA: Insufficient documentation

## 2016-04-22 DIAGNOSIS — M8580 Other specified disorders of bone density and structure, unspecified site: Secondary | ICD-10-CM | POA: Insufficient documentation

## 2016-04-22 DIAGNOSIS — I10 Essential (primary) hypertension: Secondary | ICD-10-CM | POA: Diagnosis not present

## 2016-04-22 DIAGNOSIS — E559 Vitamin D deficiency, unspecified: Secondary | ICD-10-CM | POA: Diagnosis not present

## 2016-04-22 DIAGNOSIS — F329 Major depressive disorder, single episode, unspecified: Secondary | ICD-10-CM | POA: Insufficient documentation

## 2016-04-22 DIAGNOSIS — I480 Paroxysmal atrial fibrillation: Secondary | ICD-10-CM | POA: Diagnosis present

## 2016-04-22 DIAGNOSIS — Z8249 Family history of ischemic heart disease and other diseases of the circulatory system: Secondary | ICD-10-CM | POA: Insufficient documentation

## 2016-04-22 DIAGNOSIS — Z8679 Personal history of other diseases of the circulatory system: Secondary | ICD-10-CM

## 2016-04-22 DIAGNOSIS — Z888 Allergy status to other drugs, medicaments and biological substances status: Secondary | ICD-10-CM | POA: Diagnosis not present

## 2016-04-22 DIAGNOSIS — Z823 Family history of stroke: Secondary | ICD-10-CM | POA: Diagnosis not present

## 2016-04-22 DIAGNOSIS — Z885 Allergy status to narcotic agent status: Secondary | ICD-10-CM | POA: Insufficient documentation

## 2016-04-22 DIAGNOSIS — Z79899 Other long term (current) drug therapy: Secondary | ICD-10-CM | POA: Insufficient documentation

## 2016-04-22 DIAGNOSIS — E785 Hyperlipidemia, unspecified: Secondary | ICD-10-CM | POA: Diagnosis not present

## 2016-04-22 DIAGNOSIS — Z882 Allergy status to sulfonamides status: Secondary | ICD-10-CM | POA: Insufficient documentation

## 2016-04-22 DIAGNOSIS — Z9889 Other specified postprocedural states: Secondary | ICD-10-CM

## 2016-04-22 DIAGNOSIS — K219 Gastro-esophageal reflux disease without esophagitis: Secondary | ICD-10-CM | POA: Insufficient documentation

## 2016-04-22 DIAGNOSIS — F419 Anxiety disorder, unspecified: Secondary | ICD-10-CM | POA: Diagnosis not present

## 2016-04-22 DIAGNOSIS — Z7901 Long term (current) use of anticoagulants: Secondary | ICD-10-CM | POA: Diagnosis not present

## 2016-04-22 NOTE — Progress Notes (Signed)
Primary Care Physician: Leeanne Rio, PA-C Referring Physician:Dr. Brien Few Shannon [name] is a 66 y.o. female with a h/o afib s/p ablation 3/21 by Dr. Rayann Heman in the [place] [facility] for f/u. She did go to the ER 3/21 for rt groin pain, with no significant findings.  She currently denies any groin pain or swallowing difficulties.She still feels somewhat winded and fatigued, improved since her procedure though. Has only noted two afib episodes since surgery, of shorter duration and less noticeable than before.  Today, she denies symptoms of palpitations, chest pain, shortness of breath, orthopnea, PND, lower extremity edema, dizziness, presyncope, syncope, or neurologic sequela. The patient is tolerating medications without difficulties and is otherwise without complaint today.   Past Medical History:  Diagnosis Date  . Anxiety   . Arthritis    "neck and lower back" (03/25/2016)  . Asthma 1990s X 1   "short term inhaler use"   . Chronic lower back pain   . Degenerative disorder of bone   . Depression   . Diastolic dysfunction   . Drug-induced lupus erythematosus    HCTZ induced; "still gettin over it" (03/25/2016)  . GERD (gastroesophageal reflux disease)   . Herniated disc, cervical   . Hyperlipidemia   . Hypertension   . Neuromuscular disorder ([place])    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 HTN  . Orthostatic hypotension   . OSA on CPAP   . Osteopenia   . PAF (paroxysmal atrial fibrillation) ([place])   . Pinched nerve in neck   . T12 compression fracture ([place]) 11/2015  . Vitamin D deficiency   . Whiplash injury 06/07/2010   Past Surgical History:  Procedure Laterality Date  . APPENDECTOMY  1990s  . ATRIAL FIBRILLATION ABLATION N/A 03/25/2016   Procedure: Atrial Fibrillation Ablation;  Surgeon: [name], MD;  Location: [facility];  Service: Cardiovascular;  Laterality: N/A;  . FOREARM FRACTURE SURGERY Left ~ 02/2011   "broke arm; shattered  wrist"  . FOREARM HARDWARE REMOVAL Right ~ 07/2011  . FRACTURE SURGERY    . TEE WITHOUT CARDIOVERSION N/A 03/24/2016   Procedure: TRANSESOPHAGEAL ECHOCARDIOGRAM (TEE);  Surgeon: Sanda Klein, MD;  Location: [facility] ENDOSCOPY;  Service: Cardiovascular;  Laterality: N/A;    Current Outpatient Prescriptions  Medication Sig Dispense Refill  . acetaminophen (TYLENOL) 650 MG CR tablet Take 650-1,300 mg by mouth every 8 (eight) hours as needed for pain.     Marland Kitchen ALPRAZolam (XANAX) 0.5 MG tablet Take 1 tablet (0.5 mg total) by mouth 3 (three) times daily as needed for anxiety. (Patient not taking: Reported on 04/03/2016) 30 tablet 1  . amLODipine (NORVASC) 5 MG tablet Take 1 tablet (5 mg total) by mouth daily.    . carvedilol (COREG) 12.5 MG tablet Take 1 tablet (12.5 mg total) by mouth 2 (two) times daily. 180 tablet 1  . Cholecalciferol (VITAMIN D-3 PO) Take 1 tablet by mouth at bedtime.     . DULoxetine (CYMBALTA) 30 MG capsule TAKE ONE CAPSULE BY MOUTH ONCE DAILY (Patient taking differently: Take 30 mg by mouth at bedtime) 30 capsule 5  . ergocalciferol (VITAMIN D2) 50000 units capsule Take 1 capsule (50,000 Units total) by mouth every 30 (thirty) days. 4 capsule 3  . flecainide (TAMBOCOR) 50 MG tablet Take 1.5 tablets (75 mg total) by mouth 2 (two) times daily. May take additional 1-2 tablets (50-100 mg) daily as needed for palpitations 120 tablet 3  . HYDROcodone-acetaminophen (NORCO/VICODIN) 5-325 MG  tablet Take 1-2 tablets by mouth every 8 (eight) hours as needed for moderate pain or severe pain. 5 tablet 0  . pantoprazole (PROTONIX) 40 MG tablet Take 1 tablet (40 mg total) by mouth daily. (Patient taking differently: Take 40 mg by mouth at bedtime. ) 45 tablet 0  . rivaroxaban (XARELTO) 20 MG TABS tablet Take 1 tablet (20 mg total) by mouth daily with supper. (Patient taking differently: Take 20 mg by mouth at bedtime. ) 30 tablet 11   No current facility-administered medications for this encounter.      Allergies  Allergen Reactions  . Ace Inhibitors Cough  . Hctz [Hydrochlorothiazide] Other (See Comments)    Caused drug-induced LUPUS  . Oxycodone Other (See Comments)    Hallucinations  . Prednisone Other (See Comments)    Made patient very aggressive  . Sulfa Antibiotics Nausea And Vomiting  . Sulfur Nausea And Vomiting  . Voltaren [Diclofenac Sodium] Other (See Comments)    Made patient become aggressive    Social History   Social History  . Marital status: Married    Spouse name: N/A  . Number of children: N/A  . Years of education: N/A   Occupational History  . Not on file.   Social History Main Topics  . Smoking status: Former Smoker    Packs/day: 0.50    Years: 44.00    Types: Cigarettes, E-cigarettes    Quit date: 09/06/2013  . Smokeless tobacco: Never Used  . Alcohol use Yes     Comment: 03/25/2016 "nothing for a couple years now; was having a drink on anniversary and Christmas"  . Drug use: No  . Sexual activity: Not Currently   Other Topics Concern  . Not on file   Social History Narrative   ** Merged History Encounter **        Family History  Problem Relation Age of Onset  . Cancer Mother   . Stroke Father   . Hypertension Father   . Hypertension Maternal Grandmother   . Diabetes Neg Hx     ROS- All systems are reviewed and negative except as per the HPI above  Physical Exam: There were no vitals filed for this visit. Wt Readings from Last 3 Encounters:  03/26/16 214 lb (97.1 kg)  03/21/16 216 lb 14.9 oz (98.4 kg)  02/29/16 218 lb (98.9 kg)    Labs: Lab Results  Component Value Date   NA 137 04/03/2016   K 3.8 04/03/2016   CL 101 04/03/2016   CO2 27 04/03/2016   GLUCOSE 107 (H) 04/03/2016   BUN 13 04/03/2016   CREATININE 0.83 04/03/2016   CALCIUM 9.6 04/03/2016   PHOS 4.6 11/26/2015   MG 2.1 12/08/2015   Lab Results  Component Value Date   INR 1.22 04/03/2016   Lab Results  Component Value Date   CHOL 189 12/08/2015    HDL 48 12/08/2015   LDLCALC 118 (H) 12/08/2015   TRIG 116 12/08/2015     GEN- The patient is well appearing, alert and oriented x 3 today.   Head- normocephalic, atraumatic Eyes-  Sclera clear, conjunctiva pink Ears- hearing intact Oropharynx- clear Neck- supple, no JVP Lymph- no cervical lymphadenopathy Lungs- Clear to ausculation bilaterally, normal work of breathing Heart- Regular rate and rhythm, no murmurs, rubs or gallops, PMI not laterally displaced GI- soft, NT, ND, + BS Extremities- no clubbing, cyanosis, or edema MS- no significant deformity or atrophy Skin- no rash or lesion Psych- euthymic mood, full affect  Neuro- strength and sensation are intact  EKG-sinus brady at 55 bpm, pr int 154 ms, qrs int 84 ms, qtc 453 ms Epic records reviewed    Assessment and Plan: 1. afib s/p ablation with little afib burden Continue flecainide  Continue carvedilol Continue xarelto Resume activities as tolerance will allow  2. HTN Well managed  f/u with Dr. Rayann Heman 6/25  afib clinic as needed  Butch Penny C. Marisha Renier, [facility] [address] [place], [place] [address] [phone]

## 2016-04-26 ENCOUNTER — Telehealth: Payer: Self-pay | Admitting: Cardiology

## 2016-04-26 NOTE — Telephone Encounter (Signed)
Pt called reporting her HR has been ranging between 60-120 today according to her Fitbit. Blood pressure has been systolic 88-757V. Took her morning medications including Flecainide. Overall does not feel symptomatic. States she is due for her evening dose of Flecainide, last BP 115/60. Instructed ok to take at this time, informed that if she becomes symptomatic, Bp drops, or HR remains elevated >120 sustained to come in for evaluation. She was ok with this plan, and thanked me for the call back.   Reino Bellis NP-c

## 2016-04-28 ENCOUNTER — Other Ambulatory Visit: Payer: Self-pay | Admitting: Physician Assistant

## 2016-04-28 ENCOUNTER — Other Ambulatory Visit (HOSPITAL_COMMUNITY): Payer: Self-pay | Admitting: *Deleted

## 2016-04-28 MED ORDER — FLECAINIDE ACETATE 50 MG PO TABS: 75.0000 mg | ORAL_TABLET | Freq: Two times a day (BID) | ORAL | 3 refills | Status: DC

## 2016-04-30 ENCOUNTER — Other Ambulatory Visit (HOSPITAL_COMMUNITY): Payer: Self-pay | Admitting: *Deleted

## 2016-04-30 MED ORDER — FLECAINIDE ACETATE 50 MG PO TABS: 75.0000 mg | ORAL_TABLET | Freq: Two times a day (BID) | ORAL | 3 refills | Status: DC

## 2016-05-20 ENCOUNTER — Ambulatory Visit (INDEPENDENT_AMBULATORY_CARE_PROVIDER_SITE_OTHER): Payer: BLUE CROSS/BLUE SHIELD | Admitting: Physician Assistant

## 2016-05-20 ENCOUNTER — Encounter: Payer: Self-pay | Admitting: Physician Assistant

## 2016-05-20 VITALS — BP 124/72 | HR 60 | Temp 98.4°F | Resp 14 | Ht 67.0 in | Wt 215.0 lb

## 2016-05-20 DIAGNOSIS — F419 Anxiety disorder, unspecified: Secondary | ICD-10-CM

## 2016-05-20 DIAGNOSIS — S22000S Wedge compression fracture of unspecified thoracic vertebra, sequela: Secondary | ICD-10-CM | POA: Diagnosis not present

## 2016-05-20 DIAGNOSIS — M353 Polymyalgia rheumatica: Secondary | ICD-10-CM | POA: Diagnosis not present

## 2016-05-20 DIAGNOSIS — I4891 Unspecified atrial fibrillation: Secondary | ICD-10-CM | POA: Diagnosis not present

## 2016-05-20 DIAGNOSIS — R5382 Chronic fatigue, unspecified: Secondary | ICD-10-CM | POA: Diagnosis not present

## 2016-05-20 DIAGNOSIS — F329 Major depressive disorder, single episode, unspecified: Secondary | ICD-10-CM

## 2016-05-20 LAB — COMPREHENSIVE METABOLIC PANEL
ALBUMIN: 4.4 g/dL (ref 3.5–5.2)
ALK PHOS: 127 U/L — AB (ref 39–117)
ALT: 19 U/L (ref 0–35)
AST: 18 U/L (ref 0–37)
BILIRUBIN TOTAL: 0.4 mg/dL (ref 0.2–1.2)
BUN: 11 mg/dL (ref 6–23)
CO2: 31 mEq/L (ref 19–32)
Calcium: 9.7 mg/dL (ref 8.4–10.5)
Chloride: 101 mEq/L (ref 96–112)
Creatinine, Ser: 0.74 mg/dL (ref 0.40–1.20)
GFR: 83.73 mL/min (ref >60.00)
GLUCOSE: 107 mg/dL — AB (ref 70–99)
Potassium: 4.1 mEq/L (ref 3.5–5.1)
Sodium: 138 mEq/L (ref 135–145)
TOTAL PROTEIN: 7.1 g/dL (ref 6.0–8.3)

## 2016-05-20 LAB — VITAMIN B12: Vitamin B-12: 308 pg/mL (ref 211–911)

## 2016-05-20 LAB — T4, FREE: Free T4: 0.85 ng/dL (ref 0.60–1.60)

## 2016-05-20 LAB — TSH: TSH: 1.21 u[IU]/mL (ref 0.35–4.50)

## 2016-05-20 LAB — CBC
HCT: 35.2 % — ABNORMAL LOW (ref 36.0–46.0)
HEMOGLOBIN: 11.6 g/dL — AB (ref 12.0–15.0)
MCHC: 32.8 g/dL (ref 30.0–36.0)
MCV: 87.9 fl (ref 78.0–100.0)
Platelets: 349 10*3/uL (ref 150.0–400.0)
RBC: 4 Mil/uL (ref 3.87–5.11)
RDW: 14.8 % (ref 11.5–15.5)
WBC: 7.4 10*3/uL (ref 4.0–10.5)

## 2016-05-20 MED ORDER — DULOXETINE HCL 60 MG PO CPEP: 60.0000 mg | ORAL_CAPSULE | Freq: Every day | ORAL | 1 refills | Status: DC

## 2016-05-20 NOTE — Progress Notes (Signed)
Pre visit review using our clinic review tool, if applicable. No additional management support is needed unless otherwise documented below in the visit note. 

## 2016-05-20 NOTE — Progress Notes (Signed)
Patient presents to clinic today c/o fatigue over the past month s/p her orthopedic problems and cardiac procedures. Patient sustaining a thoracic vertebral fracture in February of this year. Also with cardiac ablation for atrial fibrillation on 03/25/16.  Since that time patient noted initial increase in energy and was feeling better overall but notes this lasted only a short while. Notes significant decline in energy. States she will sleep from 2AM to 10AM, eat breakfast, do some things around the house and then go back to sleep at noon, sleeping until 3-4 PM in the evening. Is sleeping well at night, wearing her CPAP. States she starts out with good energy level that rapidly deteriorates. Notes some mental sluggishness as well without memory changes.  Patient denies any chest pain, racing heart frequent headaches. Will note some lightheadedness on rare occasions when standing too quickly. Home BP averaging 130-150/70-85 per patient. Wears a pulse monitor due to her atrial fibrillation. Denies heart rates above 110 max. Usually in the 60s per patient.  Patient notes frustration due to her medical conditions, feeling that she cannot do much any more. This is causing significant depressed mood and anhedonia. Patient denies any SI/HI. Is taking her Cymbalta as directed and notes it does help her some. Has good support system at home.   Past Medical History:  Diagnosis Date  . Anxiety   . Arthritis    "neck and lower back" (03/25/2016)  . Asthma 1990s X 1   "short term inhaler use"   . Chronic lower back pain   . Degenerative disorder of bone   . Depression   . Diastolic dysfunction   . Drug-induced lupus erythematosus    HCTZ induced; "still gettin over it" (03/25/2016)  . GERD (gastroesophageal reflux disease)   . Herniated disc, cervical   . Hyperlipidemia   . Hypertension   . Neuromuscular disorder ([place])    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 HTN  . Orthostatic hypotension     . OSA on CPAP   . Osteopenia   . PAF (paroxysmal atrial fibrillation) ([place])   . Pinched nerve in neck   . T12 compression fracture ([place]) 11/2015  . Vitamin D deficiency   . Whiplash injury 06/07/2010    Current Outpatient Prescriptions on File Prior to Visit  Medication Sig Dispense Refill  . acetaminophen (TYLENOL) 650 MG CR tablet Take 650-1,300 mg by mouth every 8 (eight) hours as needed for pain.     Marland Kitchen ALPRAZolam (XANAX) 0.5 MG tablet Take 1 tablet (0.5 mg total) by mouth 3 (three) times daily as needed for anxiety. 30 tablet 1  . amLODipine (NORVASC) 5 MG tablet Take 1 tablet (5 mg total) by mouth daily.    . carvedilol (COREG) 12.5 MG tablet Take 1 tablet (12.5 mg total) by mouth 2 (two) times daily. 180 tablet 1  . Cholecalciferol (VITAMIN D-3) 1000 units CAPS Take 1 tablet by mouth at bedtime.     . ergocalciferol (VITAMIN D2) 50000 units capsule Take 1 capsule (50,000 Units total) by mouth every 30 (thirty) days. 4 capsule 3  . flecainide (TAMBOCOR) 50 MG tablet Take 1.5 tablets (75 mg total) by mouth 2 (two) times daily. May take additional 1-2 tablets (50-100 mg) daily as needed for breakthrough afib 115 tablet 3  . rivaroxaban (XARELTO) 20 MG TABS tablet Take 1 tablet (20 mg total) by mouth daily with supper. (Patient taking differently: Take 20 mg by mouth at bedtime. ) 30 tablet 11  No current facility-administered medications on file prior to visit.     Allergies  Allergen Reactions  . Ace Inhibitors Cough  . Hctz [Hydrochlorothiazide] Other (See Comments)    Caused drug-induced LUPUS  . Oxycodone Other (See Comments)    Hallucinations  . Prednisone Other (See Comments)    Made patient very aggressive  . Sulfa Antibiotics Nausea And Vomiting  . Sulfur Nausea And Vomiting  . Voltaren [Diclofenac Sodium] Other (See Comments)    Made patient become aggressive    Family History  Problem Relation Age of Onset  . Cancer Mother   . Stroke Father   . Hypertension  Father   . Hypertension Maternal Grandmother   . Diabetes Neg Hx     Social History   Social History  . Marital status: Married    Spouse name: N/A  . Number of children: N/A  . Years of education: N/A   Social History Main Topics  . Smoking status: Former Smoker    Packs/day: 0.50    Years: 44.00    Types: Cigarettes, E-cigarettes    Quit date: 09/06/2013  . Smokeless tobacco: Never Used  . Alcohol use Yes     Comment: 03/25/2016 "nothing for a couple years now; was having a drink on anniversary and Christmas"  . Drug use: No  . Sexual activity: Not Currently   Other Topics Concern  . None   Social History Narrative   ** Merged History Encounter **       Review of Systems - See HPI.  All other ROS are negative.  BP 124/72   Pulse 60   Temp 98.4 F (36.9 C) (Oral)   Resp 14   Ht '5\' 7"'  (1.702 m)   Wt 215 lb (97.5 kg)   SpO2 98%   BMI 33.67 kg/m   Physical Exam  Constitutional: She is oriented to person, place, and time and well-developed, well-nourished, and in no distress.  HENT:  Head: Normocephalic and atraumatic.  Eyes: Conjunctivae are normal.  Neck: Neck supple. No thyromegaly present.  Cardiovascular: Normal rate, regular rhythm, normal heart sounds and intact distal pulses.   Pulmonary/Chest: Effort normal and breath sounds normal. No respiratory distress. She has no wheezes. She has no rales. She exhibits no tenderness.  Abdominal: Soft. Bowel sounds are normal. She exhibits no distension and no mass. There is no tenderness. There is no rebound and no guarding.  Lymphadenopathy:    She has no cervical adenopathy.  Neurological: She is alert and oriented to person, place, and time.  Skin: Skin is warm and dry. No rash noted.  Psychiatric: Affect normal.  Vitals reviewed.  Recent Results (from the past 2160 hour(s))  Comprehensive metabolic panel     Status: Abnormal   Collection Time: 03/20/16 12:50 PM  Result Value Ref Range   Sodium 139 135 - 145  mmol/L   Potassium 3.9 3.5 - 5.1 mmol/L   Chloride 105 101 - 111 mmol/L   CO2 25 22 - 32 mmol/L   Glucose, Bld 101 (H) 65 - 99 mg/dL   BUN 8 6 - 20 mg/dL   Creatinine, Ser 0.73 0.44 - 1.00 mg/dL   Calcium 9.9 8.9 - 10.3 mg/dL   Total Protein 7.8 6.5 - 8.1 g/dL   Albumin 4.1 3.5 - 5.0 g/dL   AST 23 15 - 41 U/L   ALT 23 14 - 54 U/L   Alkaline Phosphatase 118 38 - 126 U/L   Total Bilirubin 0.4 0.3 -  1.2 mg/dL   GFR calc non Af Amer >60 >60 mL/min   GFR calc Af Amer >60 >60 mL/min    Comment: (NOTE) The eGFR has been calculated using the CKD EPI equation. This calculation has not been validated in all clinical situations. eGFR's persistently <60 mL/min signify possible Chronic Kidney Disease.    Anion gap 9 5 - 15  Troponin I     Status: Abnormal   Collection Time: 03/20/16 12:50 PM  Result Value Ref Range   Troponin I 0.05 (HH) <0.03 ng/mL    Comment: CRITICAL RESULT CALLED TO, READ BACK BY AND VERIFIED WITH: J.BAILEY,RN [id number] 03/20/16 TYSOR,T   CBC with Differential     Status: None   Collection Time: 03/20/16 12:50 PM  Result Value Ref Range   WBC 9.9 4.0 - 10.5 K/uL   RBC 4.73 3.87 - 5.11 MIL/uL   Hemoglobin 13.4 12.0 - 15.0 g/dL   HCT 41.3 36.0 - 46.0 %   MCV 87.3 78.0 - 100.0 fL   MCH 28.3 26.0 - 34.0 pg   MCHC 32.4 30.0 - 36.0 g/dL   RDW 14.6 11.5 - 15.5 %   Platelets 299 150 - 400 K/uL   Neutrophils Relative % 74 %   Neutro Abs 7.3 1.7 - 7.7 K/uL   Lymphocytes Relative 17 %   Lymphs Abs 1.7 0.7 - 4.0 K/uL   Monocytes Relative 7 %   Monocytes Absolute 0.7 0.1 - 1.0 K/uL   Eosinophils Relative 2 %   Eosinophils Absolute 0.2 0.0 - 0.7 K/uL   Basophils Relative 0 %   Basophils Absolute 0.0 0.0 - 0.1 K/uL  Protime-INR     Status: Abnormal   Collection Time: 03/20/16 12:50 PM  Result Value Ref Range   Prothrombin Time 17.4 (H) 11.4 - 15.2 seconds   INR 1.41   HIV antibody (Routine Testing)     Status: None   Collection Time: 03/20/16  5:35 PM  Result Value Ref  Range   HIV Screen 4th Generation wRfx Non Reactive Non Reactive    Comment: (NOTE) Performed At: [facility] [address] [place], [place] [id number] Lindon Romp MD [id number]   Troponin I     Status: Abnormal   Collection Time: 03/20/16  5:35 PM  Result Value Ref Range   Troponin I 0.04 (HH) <0.03 ng/mL    Comment: CRITICAL VALUE NOTED.  VALUE IS CONSISTENT WITH PREVIOUSLY REPORTED AND CALLED VALUE.  Troponin I     Status: Abnormal   Collection Time: 03/20/16 10:44 PM  Result Value Ref Range   Troponin I 0.04 (HH) <0.03 ng/mL    Comment: CRITICAL VALUE NOTED.  VALUE IS CONSISTENT WITH PREVIOUSLY REPORTED AND CALLED VALUE.  Troponin I     Status: Abnormal   Collection Time: 03/21/16  5:11 AM  Result Value Ref Range   Troponin I 0.04 (HH) <0.03 ng/mL    Comment: CRITICAL VALUE NOTED.  VALUE IS CONSISTENT WITH PREVIOUSLY REPORTED AND CALLED VALUE.  Basic metabolic panel     Status: Abnormal   Collection Time: 03/21/16  5:11 AM  Result Value Ref Range   Sodium 138 135 - 145 mmol/L   Potassium 3.9 3.5 - 5.1 mmol/L   Chloride 103 101 - 111 mmol/L   CO2 27 22 - 32 mmol/L   Glucose, Bld 103 (H) 65 - 99 mg/dL   BUN 12 6 - 20 mg/dL   Creatinine, Ser 0.99 0.44 - 1.00 mg/dL   Calcium  9.4 8.9 - 10.3 mg/dL   GFR calc non Af Amer 59 (L) >60 mL/min   GFR calc Af Amer >60 >60 mL/min    Comment: (NOTE) The eGFR has been calculated using the CKD EPI equation. This calculation has not been validated in all clinical situations. eGFR's persistently <60 mL/min signify possible Chronic Kidney Disease.    Anion gap 8 5 - 15  CBC     Status: Abnormal   Collection Time: 03/21/16  5:11 AM  Result Value Ref Range   WBC 8.4 4.0 - 10.5 K/uL   RBC 4.01 3.87 - 5.11 MIL/uL   Hemoglobin 11.3 (L) 12.0 - 15.0 g/dL   HCT 35.6 (L) 36.0 - 46.0 %   MCV 88.8 78.0 - 100.0 fL   MCH 28.2 26.0 - 34.0 pg   MCHC 31.7 30.0 - 36.0 g/dL   RDW 15.3 11.5 - 15.5 %   Platelets 312 150 - 400 K/uL    POCT Activated clotting time     Status: None   Collection Time: 03/25/16  9:02 AM  Result Value Ref Range   Activated Clotting Time 285 seconds  POCT Activated clotting time     Status: None   Collection Time: 03/25/16  9:32 AM  Result Value Ref Range   Activated Clotting Time 318 seconds  POCT Activated clotting time     Status: None   Collection Time: 03/25/16 10:06 AM  Result Value Ref Range   Activated Clotting Time 301 seconds  POCT Activated clotting time     Status: None   Collection Time: 03/25/16 11:47 AM  Result Value Ref Range   Activated Clotting Time 175 seconds  POCT Activated clotting time     Status: None   Collection Time: 03/25/16 12:20 PM  Result Value Ref Range   Activated Clotting Time 164 seconds  CBC with Differential     Status: Abnormal   Collection Time: 04/03/16 10:47 PM  Result Value Ref Range   WBC 9.4 4.0 - 10.5 K/uL   RBC 3.76 (L) 3.87 - 5.11 MIL/uL   Hemoglobin 10.5 (L) 12.0 - 15.0 g/dL   HCT 32.8 (L) 36.0 - 46.0 %   MCV 87.2 78.0 - 100.0 fL   MCH 27.9 26.0 - 34.0 pg   MCHC 32.0 30.0 - 36.0 g/dL   RDW 14.9 11.5 - 15.5 %   Platelets 390 150 - 400 K/uL   Neutrophils Relative % 62 %   Neutro Abs 5.8 1.7 - 7.7 K/uL   Lymphocytes Relative 25 %   Lymphs Abs 2.4 0.7 - 4.0 K/uL   Monocytes Relative 9 %   Monocytes Absolute 0.8 0.1 - 1.0 K/uL   Eosinophils Relative 4 %   Eosinophils Absolute 0.3 0.0 - 0.7 K/uL   Basophils Relative 0 %   Basophils Absolute 0.0 0.0 - 0.1 K/uL  Comprehensive metabolic panel     Status: Abnormal   Collection Time: 04/03/16 10:47 PM  Result Value Ref Range   Sodium 137 135 - 145 mmol/L   Potassium 3.8 3.5 - 5.1 mmol/L   Chloride 101 101 - 111 mmol/L   CO2 27 22 - 32 mmol/L   Glucose, Bld 107 (H) 65 - 99 mg/dL   BUN 13 6 - 20 mg/dL   Creatinine, Ser 0.83 0.44 - 1.00 mg/dL   Calcium 9.6 8.9 - 10.3 mg/dL   Total Protein 7.1 6.5 - 8.1 g/dL   Albumin 3.8 3.5 - 5.0 g/dL   AST 17  15 - 41 U/L   ALT 15 14 - 54 U/L    Alkaline Phosphatase 103 38 - 126 U/L   Total Bilirubin 0.6 0.3 - 1.2 mg/dL   GFR calc non Af Amer >60 >60 mL/min   GFR calc Af Amer >60 >60 mL/min    Comment: (NOTE) The eGFR has been calculated using the CKD EPI equation. This calculation has not been validated in all clinical situations. eGFR's persistently <60 mL/min signify possible Chronic Kidney Disease.    Anion gap 9 5 - 15  Protime-INR     Status: Abnormal   Collection Time: 04/03/16 10:47 PM  Result Value Ref Range   Prothrombin Time 15.5 (H) 11.4 - 15.2 seconds   INR 1.22   I-Stat CG4 Lactic Acid, ED     Status: None   Collection Time: 04/03/16 11:34 PM  Result Value Ref Range   Lactic Acid, Venous 0.61 0.5 - 1.9 mmol/L  CBC     Status: Abnormal   Collection Time: 05/20/16 12:20 PM  Result Value Ref Range   WBC 7.4 4.0 - 10.5 K/uL   RBC 4.00 3.87 - 5.11 Mil/uL   Platelets 349.0 150.0 - 400.0 K/uL   Hemoglobin 11.6 (L) 12.0 - 15.0 g/dL   HCT 35.2 (L) 36.0 - 46.0 %   MCV 87.9 78.0 - 100.0 fl   MCHC 32.8 30.0 - 36.0 g/dL   RDW 14.8 11.5 - 15.5 %  Comp Met (CMET)     Status: Abnormal   Collection Time: 05/20/16 12:20 PM  Result Value Ref Range   Sodium 138 135 - 145 mEq/L   Potassium 4.1 3.5 - 5.1 mEq/L   Chloride 101 96 - 112 mEq/L   CO2 31 19 - 32 mEq/L   Glucose, Bld 107 (H) 70 - 99 mg/dL   BUN 11 6 - 23 mg/dL   Creatinine, Ser 0.74 0.40 - 1.20 mg/dL   Total Bilirubin 0.4 0.2 - 1.2 mg/dL   Alkaline Phosphatase 127 (H) 39 - 117 U/L   AST 18 0 - 37 U/L   ALT 19 0 - 35 U/L   Total Protein 7.1 6.0 - 8.3 g/dL   Albumin 4.4 3.5 - 5.2 g/dL   Calcium 9.7 8.4 - 10.5 mg/dL   GFR 83.73 >60.00 mL/min  TSH     Status: None   Collection Time: 05/20/16 12:20 PM  Result Value Ref Range   TSH 1.21 0.35 - 4.50 uIU/mL  T4, free     Status: None   Collection Time: 05/20/16 12:20 PM  Result Value Ref Range   Free T4 0.85 0.60 - 1.60 ng/dL    Comment: Specimens from patients who are undergoing biotin therapy and /or  ingesting biotin supplements may contain high levels of biotin.  The higher biotin concentration in these specimens interferes with this Free T4 assay.  Specimens that contain high levels  of biotin may cause false high results for this Free T4 assay.  Please interpret results in light of the total clinical presentation of the patient.    B12     Status: None   Collection Time: 05/20/16 12:20 PM  Result Value Ref Range   Vitamin B-12 308 211 - 911 pg/mL   Assessment/Plan: Anxiety and depression - will add on counseling to current regimen. Close follow-up scheduled. Atrial fibrillation with RVR ([place]) - follow-up with Cardiology as scheduled. Closed compression fracture of thoracic vertebra, sequela - resume PT Polymyalgia ([place]) - continue current regimen. Start  PT.  Chronic fatigue secondary to conditions above.  Multifactorial -- Atrial Fibrillation + Sedentary Lifestyle + Low nutrient intake + Depression + OSA. Will need comprehensive approach. Lab panel today as noted below. Will need to restart physical therapy. Will set this up. Continue depression medications as directed for now. Will start counseling. Handout given. Will also work on appropriate diet and nutrient intake. Will also get her set back up with Dr. Elsworth Soho for repeat sleep study. - CBC - Comp Met (CMET) - TSH - T4, free - B12 - Vitamin D 1,25 dihydroxy    Leeanne Rio, PA-C

## 2016-05-20 NOTE — Patient Instructions (Addendum)
Please go to the lab for blood work. I will call you with your results. We will treat accordingly.  Please start water aerobics. I will set you up with PT that takes Medicare as your insurance is changing in one month. Use the handout given to schedule an appointment with Clint Bolder.  I will speak with Pulmonology regarding sleep study.  Start the new dose of the Cymbalta. Follow-up with me in 3-4 weeks.  If you note any worsening mood, resume prior dose of Cymbalta and call the office. Any thoughts of harming yourself or others, please go to the ER.

## 2016-05-21 ENCOUNTER — Encounter: Payer: Self-pay | Admitting: Physician Assistant

## 2016-05-22 LAB — VITAMIN D 1,25 DIHYDROXY
VITAMIN D 1, 25 (OH) TOTAL: 47 pg/mL (ref 18–72)
VITAMIN D3 1, 25 (OH): 47 pg/mL

## 2016-05-23 ENCOUNTER — Telehealth: Payer: Self-pay | Admitting: Emergency Medicine

## 2016-05-23 NOTE — Telephone Encounter (Signed)
Advised patient to eat breakfast with protein before taking her medications. Encouraged staying well hydrated. Continue to monitor blood pressures over the weekend and call back on Monday with readings for any adjustments. Advised her to contact Cardiology for any recommendations. She is agreeable.

## 2016-05-23 NOTE — Telephone Encounter (Signed)
Would have her keep a very consistent diet, making sure to eat breakfast before taking medication. Crackers are not sufficient. Needs to add on protein to the toast and jelly. Make sure she is staying well hydrated. Keep close eye on BP over the next couple of days. We may have to tweak her regimen. Also recommend they reach out to Cardiology who is managing medications.

## 2016-05-23 NOTE — Telephone Encounter (Signed)
Patient husband [name] called stating patient blood pressure keeps dropping.  Yesterday blood pressure at 10:30a was 144/71 Today at 10:00a was 159/92 She took her medications at 11:00a 116/64 Today at 11:23a she felt dizzy bp was 103/59 She ate crackers and bp was 133/73 She only had toast with jelly for breakfast  They are concerned about the fluctuating blood pressures. Please advise

## 2016-05-26 ENCOUNTER — Telehealth: Payer: Self-pay | Admitting: Internal Medicine

## 2016-05-26 ENCOUNTER — Telehealth (HOSPITAL_COMMUNITY): Payer: Self-pay | Admitting: *Deleted

## 2016-05-26 NOTE — Telephone Encounter (Signed)
Pt cld in this morning wanting some advice on her BP.Marland Kitchen Pt stated that BP was dropping.  Pt stated that she would take BP in the morning and would have readings like 159/92 and after taking her medication this would go down to 116/64.  She also cited readings in the 130s.  I asked pt if these were making her feel bad because the lower reading she was providing were within normal limits.  Pt stated that it did.  I saw she had already contacted PCP and asked pt if she would schedule with PCP for these bps since they are following bp meds.  She wanted to call and get triage advice from Dr. Forde Dandy nurse instead since she had already contacted primary and they told her to be sure she was eating before taking her meds.

## 2016-05-26 NOTE — Telephone Encounter (Signed)
w Message:   Please call,she says she is having problems with her blood pressure.Her pressure is dropping quick and she can hardly stand and she is real dizzy.

## 2016-05-26 NOTE — Telephone Encounter (Signed)
Called, spoke with pt. Pt stated she noticed notable BP decrease in BP since 05/23/16. On 05/23/16, pt started taking vitamin B12, and also her dose of Cymbalta increased from 30 mg to 60 mg. BP this morning (prior to meds) 159/92, after meds BP 116/64. Pt states she feels fatigued. Reminded for pt to change positions slowly. Informed I will forward to Dr. Lovena Le to advise.

## 2016-05-27 ENCOUNTER — Other Ambulatory Visit: Payer: Self-pay | Admitting: Physician Assistant

## 2016-05-27 DIAGNOSIS — D649 Anemia, unspecified: Secondary | ICD-10-CM

## 2016-05-27 DIAGNOSIS — R7989 Other specified abnormal findings of blood chemistry: Secondary | ICD-10-CM

## 2016-05-27 DIAGNOSIS — R5382 Chronic fatigue, unspecified: Secondary | ICD-10-CM | POA: Insufficient documentation

## 2016-05-27 DIAGNOSIS — R945 Abnormal results of liver function studies: Secondary | ICD-10-CM

## 2016-05-27 MED ORDER — THERA VITAL M PO TABS: 1.0000 | ORAL_TABLET | Freq: Every day | ORAL | 0 refills | Status: DC

## 2016-05-27 MED ORDER — CYANOCOBALAMIN 500 MCG PO TABS: 500.0000 ug | ORAL_TABLET | Freq: Every day | ORAL | 0 refills | Status: DC

## 2016-05-27 NOTE — Telephone Encounter (Signed)
Called, spoke with pt. Informed Dr. Tanna Furry recommendation is for pt to f/u with PCP.  Pt informed today that her tiredness started prior to 5/18. With this new information, I will forward to Dr. Lovena Le and informed pt of his recommendation. Pt verbalized understanding and thanked me for calling.

## 2016-05-28 ENCOUNTER — Encounter: Payer: Self-pay | Admitting: Physician Assistant

## 2016-05-28 ENCOUNTER — Telehealth: Payer: Self-pay

## 2016-05-28 NOTE — Telephone Encounter (Signed)
error 

## 2016-05-28 NOTE — Telephone Encounter (Signed)
Called, spoke with pt. Informed pt Dr. Rayann Heman stated pt may stop Flecainide and make appt in the [facility]-fib Clinic this week, or see him next week. Pt stated she would like to set up appt in the [facility]. Pt stated her heart rhythm feels regular, and she will stop the Flecainide. Informed I someone will call her to schedule appt in A-fib Clinic. Pt verbalized understanding and thanked me for calling.

## 2016-05-28 NOTE — Telephone Encounter (Signed)
Returned pt's call r/t tiredness. Pt informed PCP stated could be from cardiac medication. Dr. Lovena Le will defer to Dr. Rayann Heman (Dr. Rayann Heman did a-fib ablation on 03/25/16). Will forward to Dr. Rayann Heman to advise.

## 2016-05-29 NOTE — Telephone Encounter (Signed)
appt made for 5/29 per pt request.

## 2016-06-03 ENCOUNTER — Encounter (HOSPITAL_COMMUNITY): Payer: Self-pay | Admitting: Nurse Practitioner

## 2016-06-03 ENCOUNTER — Ambulatory Visit (HOSPITAL_COMMUNITY)
Admission: RE | Admit: 2016-06-03 | Discharge: 2016-06-03 | Disposition: A | Discharge status: Home / Self Care | Payer: BLUE CROSS/BLUE SHIELD | Source: Ambulatory Visit | Attending: Nurse Practitioner | Admitting: Nurse Practitioner

## 2016-06-03 VITALS — BP 126/72 | HR 67 | Ht 67.0 in | Wt 216.0 lb

## 2016-06-03 DIAGNOSIS — I4891 Unspecified atrial fibrillation: Secondary | ICD-10-CM | POA: Diagnosis not present

## 2016-06-03 DIAGNOSIS — I491 Atrial premature depolarization: Secondary | ICD-10-CM | POA: Diagnosis not present

## 2016-06-03 DIAGNOSIS — Z87891 Personal history of nicotine dependence: Secondary | ICD-10-CM | POA: Diagnosis not present

## 2016-06-03 DIAGNOSIS — Z7901 Long term (current) use of anticoagulants: Secondary | ICD-10-CM | POA: Diagnosis not present

## 2016-06-03 DIAGNOSIS — Z79899 Other long term (current) drug therapy: Secondary | ICD-10-CM | POA: Insufficient documentation

## 2016-06-03 DIAGNOSIS — I10 Essential (primary) hypertension: Secondary | ICD-10-CM | POA: Insufficient documentation

## 2016-06-03 NOTE — Progress Notes (Signed)
Primary Care Physician: Brunetta Jeans, PA-C Referring Physician:Dr. Brien Few Shannon [name] is a 66 y.o. female with a h/o afib s/p ablation 3/21 by Dr. Rayann Heman in the [facility] for f/u. She did go to the ER 3/21 for rt groin pain, with no significant findings.  She currently denies any groin pain or swallowing difficulties.She still feels somewhat winded and fatigued, improved since her procedure though. Has only noted two afib episodes since surgery, of shorter duration and less noticeable than before.  F/u in afib clinic 5/29 She  Was c/o of fatigue and was told to stop flecainide and f/u here. She stopped the drug over the weekend and is feeling better with no afib noted. She is also c/o that she will note that her BP will drop in the am making her feel weak after she takes her am meds.  Today, she denies symptoms of palpitations, chest pain, shortness of breath, orthopnea, PND, lower extremity edema, dizziness, presyncope, syncope, or neurologic sequela. The patient is tolerating medications without difficulties and is otherwise without complaint today.   Past Medical History:  Diagnosis Date  . Anxiety   . Arthritis    "neck and lower back" (03/25/2016)  . Asthma 1990s X 1   "short term inhaler use"   . Chronic lower back pain   . Degenerative disorder of bone   . Depression   . Diastolic dysfunction   . Drug-induced lupus erythematosus    HCTZ induced; "still gettin over it" (03/25/2016)  . GERD (gastroesophageal reflux disease)   . Herniated disc, cervical   . Hyperlipidemia   . Hypertension   . Neuromuscular disorder ([place])    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 HTN  . Orthostatic hypotension   . OSA on CPAP   . Osteopenia   . PAF (paroxysmal atrial fibrillation) ([place])   . Pinched nerve in neck   . T12 compression fracture ([place]) 11/2015  . Vitamin D deficiency   . Whiplash injury 06/07/2010   Past Surgical History:  Procedure Laterality  Date  . APPENDECTOMY  1990s  . ATRIAL FIBRILLATION ABLATION N/A 03/25/2016   Procedure: Atrial Fibrillation Ablation;  Surgeon: [name], MD;  Location: [facility];  Service: Cardiovascular;  Laterality: N/A;  . FOREARM FRACTURE SURGERY Left ~ 02/2011   "broke arm; shattered wrist"  . FOREARM HARDWARE REMOVAL Right ~ 07/2011  . FRACTURE SURGERY    . TEE WITHOUT CARDIOVERSION N/A 03/24/2016   Procedure: TRANSESOPHAGEAL ECHOCARDIOGRAM (TEE);  Surgeon: Sanda Klein, MD;  Location: [facility] ENDOSCOPY;  Service: Cardiovascular;  Laterality: N/A;    Current Outpatient Prescriptions  Medication Sig Dispense Refill  . acetaminophen (TYLENOL) 650 MG CR tablet Take 650-1,300 mg by mouth every 8 (eight) hours as needed for pain.     Marland Kitchen ALPRAZolam (XANAX) 0.5 MG tablet Take 1 tablet (0.5 mg total) by mouth 3 (three) times daily as needed for anxiety. 30 tablet 1  . amLODipine (NORVASC) 5 MG tablet Take 1 tablet (5 mg total) by mouth daily.    . carvedilol (COREG) 12.5 MG tablet Take 1 tablet (12.5 mg total) by mouth 2 (two) times daily. 180 tablet 1  . Cholecalciferol (VITAMIN D-3) 1000 units CAPS Take 1 tablet by mouth at bedtime.     . cyanocobalamin 500 MCG tablet Take 1 tablet (500 mcg total) by mouth daily. 30 tablet 0  . DULoxetine (CYMBALTA) 60 MG capsule Take 1 capsule (60 mg total) by  mouth daily. 30 capsule 1  . Multiple Vitamins-Minerals (MULTIVITAMIN) tablet Take 1 tablet by mouth daily. 30 tablet 0  . rivaroxaban (XARELTO) 20 MG TABS tablet Take 1 tablet (20 mg total) by mouth daily with supper. (Patient taking differently: Take 20 mg by mouth at bedtime. ) 30 tablet 11  . ergocalciferol (VITAMIN D2) 50000 units capsule Take 1 capsule (50,000 Units total) by mouth every 30 (thirty) days. (Patient not taking: Reported on 06/03/2016) 4 capsule 3   No current facility-administered medications for this encounter.     Allergies  Allergen Reactions  . Ace Inhibitors Cough  . Hctz  [Hydrochlorothiazide] Other (See Comments)    Caused drug-induced LUPUS  . Oxycodone Other (See Comments)    Hallucinations  . Prednisone Other (See Comments)    Made patient very aggressive  . Sulfa Antibiotics Nausea And Vomiting  . Sulfur Nausea And Vomiting  . Voltaren [Diclofenac Sodium] Other (See Comments)    Made patient become aggressive    Social History   Social History  . Marital status: Married    Spouse name: N/A  . Number of children: N/A  . Years of education: N/A   Occupational History  . Not on file.   Social History Main Topics  . Smoking status: Former Smoker    Packs/day: 0.50    Years: 44.00    Types: Cigarettes, E-cigarettes    Quit date: 09/06/2013  . Smokeless tobacco: Never Used  . Alcohol use Yes     Comment: 03/25/2016 "nothing for a couple years now; was having a drink on anniversary and Christmas"  . Drug use: No  . Sexual activity: Not Currently   Other Topics Concern  . Not on file   Social History Narrative   ** Merged History Encounter **        Family History  Problem Relation Age of Onset  . Cancer Mother   . Stroke Father   . Hypertension Father   . Hypertension Maternal Grandmother   . Diabetes Neg Hx     ROS- All systems are reviewed and negative except as per the HPI above  Physical Exam: Vitals:   06/03/16 1522  BP: 126/72  Pulse: 67  Weight: 216 lb (98 kg)  Height: 5\' 7"  (1.702 m)   Wt Readings from Last 3 Encounters:  06/03/16 216 lb (98 kg)  05/20/16 215 lb (97.5 kg)  04/22/16 215 lb 3.2 oz (97.6 kg)    Labs: Lab Results  Component Value Date   NA 138 05/20/2016   K 4.1 05/20/2016   CL 101 05/20/2016   CO2 31 05/20/2016   GLUCOSE 107 (H) 05/20/2016   BUN 11 05/20/2016   CREATININE 0.74 05/20/2016   CALCIUM 9.7 05/20/2016   PHOS 4.6 11/26/2015   MG 2.1 12/08/2015   Lab Results  Component Value Date   INR 1.22 04/03/2016   Lab Results  Component Value Date   CHOL 189 12/08/2015   HDL 48  12/08/2015   LDLCALC 118 (H) 12/08/2015   TRIG 116 12/08/2015     GEN- The patient is well appearing, alert and oriented x 3 today.   Head- normocephalic, atraumatic Eyes-  Sclera clear, conjunctiva pink Ears- hearing intact Oropharynx- clear Neck- supple, no JVP Lymph- no cervical lymphadenopathy Lungs- Clear to ausculation bilaterally, normal work of breathing Heart- Regular rate and rhythm, no murmurs, rubs or gallops, PMI not laterally displaced GI- soft, NT, ND, + BS Extremities- no clubbing, cyanosis, or edema MS-  no significant deformity or atrophy Skin- no rash or lesion Psych- euthymic mood, full affect Neuro- strength and sensation are intact  EKG-sinus rhythm  with PAC's at 67 bpm, pr int 134 ms, qrs int 78 ms, qtc 469 ms Epic records reviewed    Assessment and Plan: 1. afib s/p ablation with little afib burden Off flecainide for 2 days with improvement in fatigue Continue carvedilol  Continue xarelto Resume activities as tolerance will allow  2. HTN  Drop  in BP  with weakness after am meds(carvedilol/amlodipine) Advised to separate amlodipine and take with supper  f/u with Dr. Rayann Heman 6/25  afib clinic as needed  Butch Penny C. Antonino Nienhuis, [facility] [address] [place], [place] [address] [phone]

## 2016-06-10 ENCOUNTER — Telehealth: Payer: Self-pay | Admitting: Physical Therapy

## 2016-06-10 NOTE — Telephone Encounter (Signed)
Left messages to call to schedule PT eval on 5/30 & 06/10/16. No return call

## 2016-06-13 ENCOUNTER — Other Ambulatory Visit: Payer: Self-pay | Admitting: Physician Assistant

## 2016-06-13 NOTE — Telephone Encounter (Signed)
Xanax last rx 12/24/15 #30 1RF No CSC No UDS Last office visit: 05/20/16  Please advise in PCP abscence

## 2016-06-13 NOTE — Telephone Encounter (Signed)
Rx faxed to [facility]

## 2016-06-16 ENCOUNTER — Encounter: Payer: Self-pay | Admitting: Physical Therapy

## 2016-06-16 ENCOUNTER — Ambulatory Visit: Payer: Medicare Other | Attending: Family Medicine | Admitting: Physical Therapy

## 2016-06-16 DIAGNOSIS — M546 Pain in thoracic spine: Secondary | ICD-10-CM | POA: Diagnosis not present

## 2016-06-16 DIAGNOSIS — M6281 Muscle weakness (generalized): Secondary | ICD-10-CM | POA: Diagnosis not present

## 2016-06-16 DIAGNOSIS — M545 Low back pain, unspecified: Secondary | ICD-10-CM

## 2016-06-16 DIAGNOSIS — R262 Difficulty in walking, not elsewhere classified: Secondary | ICD-10-CM | POA: Diagnosis not present

## 2016-06-16 DIAGNOSIS — M62830 Muscle spasm of back: Secondary | ICD-10-CM | POA: Diagnosis not present

## 2016-06-16 NOTE — Therapy (Signed)
[address] [place], [place], [address] Phone: [phone]   Fax:  [phone]  Physical Therapy Evaluation  Patient Details  Name: Shannon [name] MRN: [id number] Date of Birth: 08/04/1950 Referring Provider: Elyn Aquas  Encounter Date: 06/16/2016      PT End of Session - 06/16/16 1036    Visit Number 1   Date for PT Re-Evaluation 08/16/16   PT Start Time 1005   PT Stop Time 1100   PT Time Calculation (min) 55 min   Activity Tolerance Patient tolerated treatment well   Behavior During Therapy [facility] for tasks assessed/performed      Past Medical History:  Diagnosis Date  . Anxiety   . Arthritis    "neck and lower back" (03/25/2016)  . Asthma 1990s X 1   "short term inhaler use"   . Chronic lower back pain   . Degenerative disorder of bone   . Depression   . Diastolic dysfunction   . Drug-induced lupus erythematosus    HCTZ induced; "still gettin over it" (03/25/2016)  . GERD (gastroesophageal reflux disease)   . Herniated disc, cervical   . Hyperlipidemia   . Hypertension   . Neuromuscular disorder ([place])    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 HTN  . Orthostatic hypotension   . OSA on CPAP   . Osteopenia   . PAF (paroxysmal atrial fibrillation) ([place])   . Pinched nerve in neck   . T12 compression fracture ([place]) 11/2015  . Vitamin D deficiency   . Whiplash injury 06/07/2010    Past Surgical History:  Procedure Laterality Date  . APPENDECTOMY  1990s  . ATRIAL FIBRILLATION ABLATION N/A 03/25/2016   Procedure: Atrial Fibrillation Ablation;  Surgeon: [name], MD;  Location: [facility];  Service: Cardiovascular;  Laterality: N/A;  . FOREARM FRACTURE SURGERY Left ~ 02/2011   "broke arm; shattered wrist"  . FOREARM HARDWARE REMOVAL Right ~ 07/2011  . FRACTURE SURGERY    . TEE WITHOUT CARDIOVERSION N/A 03/24/2016   Procedure: TRANSESOPHAGEAL ECHOCARDIOGRAM (TEE);   Surgeon: Sanda Klein, MD;  Location: [facility] ENDOSCOPY;  Service: Cardiovascular;  Laterality: N/A;    There were no vitals filed for this visit.       Subjective Assessment - 06/16/16 1010    Subjective Patient reports that she had a fall in November and sustained a thoracic compression fracture T-12.  She has been hospitalized 3 different occasions due to blood pressure and A-fib as well as the fracture.  She had heart ablation, she reports that she has not been active since November 2017.  She reports difficulty doing ADL's and activtiies and having pain in the thoracic spine.   Limitations Standing;Walking;House hold activities;Lifting   How long can you stand comfortably? 10-15 minutes   Patient Stated Goals have less pain, be able to do more   Currently in Pain? Yes   Pain Score 4    Pain Location Back   Pain Orientation Mid;Lower;Upper   Pain Descriptors / Indicators Aching;Sore;Burning   Pain Type Acute pain   Pain Onset More than a month ago   Pain Frequency Constant   Aggravating Factors  lifting, trying to do housework pain will go up to 9/10   Pain Relieving Factors Tylenol, lying down 3/10   Effect of Pain on Daily Activities can't do any housework, difficulty walking            [facility] PT  Assessment - 06/16/16 0001      Assessment   Medical Diagnosis weakness, thoracic pain   Referring Provider Elyn Aquas   Onset Date/Surgical Date 05/16/16   Prior Therapy no     Precautions   Precautions None     Balance Screen   Has the patient fallen in the past 6 months No   Has the patient had a decrease in activity level because of a fear of falling?  Yes   Is the patient reluctant to leave their home because of a fear of falling?  Yes     Home Environment   Additional Comments no stairs, has had to stop housework,      Prior Function   Level of Independence Independent   Vocation Retired   Leisure some stretching     Posture/Postural Control   Posture Comments  fwd head     ROM / Strength   AROM / PROM / Strength AROM;Strength     AROM   Overall AROM Comments Lumbar ROM was decreased 75% with pain in the T/L area with all motions     Strength   Overall Strength Comments shoulders 4-/5 with some pain in the mid back, LE's 4-/5 with some back pain     Flexibility   Soft Tissue Assessment /Muscle Length --  some HS and piriformis tightness     Palpation   Palpation comment she is very tight and tender in the paraspinals from T8-L4            Objective measurements completed on examination: See above findings.          [facility] Adult PT Treatment/Exercise - 06/16/16 0001      Modalities   Modalities Electrical Stimulation;Moist Heat     Moist Heat Therapy   Number Minutes Moist Heat 15 Minutes   Moist Heat Location Lumbar Spine     Electrical Stimulation   Electrical Stimulation Location T/L area   Electrical Stimulation Action IFC   Electrical Stimulation Parameters supine   Electrical Stimulation Goals Pain                PT Education - 06/16/16 1035    Education provided Yes   Education Details Wms flexion, HS and piriformis stretches   Person(s) Educated Patient   Methods Explanation;Demonstration;Handout   Comprehension Verbalized understanding          PT Short Term Goals - 06/16/16 1042      PT SHORT TERM GOAL #1   Title independent with initial HEP   Time 2   Period Weeks   Status New           PT Long Term Goals - 06/16/16 1042      PT LONG TERM GOAL #1   Title understand proper posture and body mechanics   Time 8   Period Weeks   Status New     PT LONG TERM GOAL #2   Title decrease pain 50%   Time 8   Period Weeks   Status New     PT LONG TERM GOAL #3   Title increase lumbar ROM 50%   Time 8   Period Weeks   Status New     PT LONG TERM GOAL #4   Title vaccum 2 rooms without pain >6/10   Time 8   Period Weeks   Status New                Plan - 06/16/16  1036    Clinical Impression Statement Patient reports a fall on Halloween sustaining a compression fracture of T12, she reports that she has had difficulty recovering as she was hospitalized 3 times since the fall for SOB and a-fib.  She report that she has not done much due to fear and pain, presents today with pain in the T/L area, decreased ROM and decreased ability to be active.   History and Personal Factors relevant to plan of care: A-Fib, lumbar DDD, stenosis   Clinical Presentation Evolving   Clinical Decision Making Low   Rehab Potential Good   PT Frequency 2x / week   PT Duration 8 weeks   PT Treatment/Interventions ADLs/Self Care Home Management;Cryotherapy;Electrical Stimulation;Moist Heat;Therapeutic activities;Therapeutic exercise;Neuromuscular re-education;Patient/family education;Manual techniques   PT Next Visit Plan slowly add exercises to get her moving, monitor soreness after exercises   Consulted and Agree with Plan of Care Patient      Patient will benefit from skilled therapeutic intervention in order to improve the following deficits and impairments:  Decreased activity tolerance, Decreased balance, Decreased mobility, Decreased strength, Postural dysfunction, Improper body mechanics, Impaired flexibility, Pain, Increased muscle spasms, Decreased range of motion  Visit Diagnosis: Pain in thoracic spine - Plan: PT plan of care cert/re-cert  Acute bilateral low back pain without sciatica - Plan: PT plan of care cert/re-cert  Muscle spasm of back - Plan: PT plan of care cert/re-cert  Muscle weakness (generalized) - Plan: PT plan of care cert/re-cert  Difficulty in walking, not elsewhere classified - Plan: PT plan of care cert/re-cert      G-Codes - 33/82/50 1044    Functional Assessment Tool Used (Outpatient Only) foto 62% limitation   Functional Limitation Mobility: Walking and moving around   Mobility: Walking and Moving Around Current Status ([id number]) At least 60  percent but less than 80 percent impaired, limited or restricted   Mobility: Walking and Moving Around Goal Status [phone]) At least 40 percent but less than 60 percent impaired, limited or restricted       Problem List Patient Active Problem List   Diagnosis Date Noted  . Chronic fatigue 05/27/2016  . A-fib ([place]) 03/25/2016  . Typical atrial flutter ([place])   . Chronic diastolic CHF (congestive heart failure) ([place]) 12/08/2015  . Elevated troponin 11/24/2015  . Atrial fibrillation with RVR ([place])   . Thoracic compression fracture ([place]) 11/16/2015  . Orthostatic hypotension 11/15/2015  . Osteopenia 09/12/2015  . Vitamin D deficiency 08/29/2015  . OSA (obstructive sleep apnea) 03/04/2015  . Polymyalgia ([place]) 11/22/2014  . Essential hypertension 08/21/2014  . Anxiety and depression 08/21/2014  . Hip stiffness 08/21/2014    Sumner Boast., PT 06/16/2016, 10:55 AM  [address] [place], [place], [address] Phone: [phone]   Fax:  [phone]  Name: Shannon [name] MRN: [id number] Date of Birth: 1950-08-24

## 2016-06-17 ENCOUNTER — Other Ambulatory Visit (INDEPENDENT_AMBULATORY_CARE_PROVIDER_SITE_OTHER): Payer: Medicare Other

## 2016-06-17 DIAGNOSIS — R945 Abnormal results of liver function studies: Secondary | ICD-10-CM | POA: Diagnosis not present

## 2016-06-17 DIAGNOSIS — D649 Anemia, unspecified: Secondary | ICD-10-CM

## 2016-06-17 DIAGNOSIS — R7989 Other specified abnormal findings of blood chemistry: Secondary | ICD-10-CM

## 2016-06-17 LAB — IRON: IRON: 55 ug/dL (ref 42–145)

## 2016-06-17 LAB — CBC WITH DIFFERENTIAL/PLATELET
Basophils Absolute: 0.1 10*3/uL (ref 0.0–0.1)
Basophils Relative: 0.8 % (ref 0.0–3.0)
EOS ABS: 0.3 10*3/uL (ref 0.0–0.7)
EOS PCT: 4.1 % (ref 0.0–5.0)
HCT: 34.3 % — ABNORMAL LOW (ref 36.0–46.0)
HEMOGLOBIN: 11.5 g/dL — AB (ref 12.0–15.0)
LYMPHS ABS: 1.4 10*3/uL (ref 0.7–4.0)
Lymphocytes Relative: 22.2 % (ref 12.0–46.0)
MCHC: 33.4 g/dL (ref 30.0–36.0)
MCV: 88.3 fl (ref 78.0–100.0)
MONO ABS: 0.4 10*3/uL (ref 0.1–1.0)
Monocytes Relative: 5.9 % (ref 3.0–12.0)
NEUTROS PCT: 67 % (ref 43.0–77.0)
Neutro Abs: 4.4 10*3/uL (ref 1.4–7.7)
Platelets: 327 10*3/uL (ref 150.0–400.0)
RBC: 3.89 Mil/uL (ref 3.87–5.11)
RDW: 14.1 % (ref 11.5–15.5)
WBC: 6.5 10*3/uL (ref 4.0–10.5)

## 2016-06-17 LAB — HEPATIC FUNCTION PANEL
ALT: 20 U/L (ref 0–35)
AST: 17 U/L (ref 0–37)
Albumin: 4 g/dL (ref 3.5–5.2)
Alkaline Phosphatase: 125 U/L — ABNORMAL HIGH (ref 39–117)
BILIRUBIN DIRECT: 0.1 mg/dL (ref 0.0–0.3)
BILIRUBIN TOTAL: 0.3 mg/dL (ref 0.2–1.2)
Total Protein: 6.8 g/dL (ref 6.0–8.3)

## 2016-06-17 LAB — VITAMIN B12: VITAMIN B 12: 375 pg/mL (ref 211–911)

## 2016-06-18 ENCOUNTER — Ambulatory Visit: Payer: Medicare Other | Admitting: Physical Therapy

## 2016-06-18 ENCOUNTER — Encounter: Payer: Self-pay | Admitting: Physical Therapy

## 2016-06-18 DIAGNOSIS — M545 Low back pain, unspecified: Secondary | ICD-10-CM

## 2016-06-18 DIAGNOSIS — M62830 Muscle spasm of back: Secondary | ICD-10-CM | POA: Diagnosis not present

## 2016-06-18 DIAGNOSIS — R262 Difficulty in walking, not elsewhere classified: Secondary | ICD-10-CM | POA: Diagnosis not present

## 2016-06-18 DIAGNOSIS — M6281 Muscle weakness (generalized): Secondary | ICD-10-CM | POA: Diagnosis not present

## 2016-06-18 DIAGNOSIS — M546 Pain in thoracic spine: Secondary | ICD-10-CM | POA: Diagnosis not present

## 2016-06-18 NOTE — Therapy (Signed)
[place] [place], [place], [address] Phone: [phone]   Fax:  [phone]  Physical Therapy Treatment  Patient Details  Name: Shannon [name] MRN: [id number] Date of Birth: Sep 02, 1950 Referring Provider: Elyn Aquas  Encounter Date: 06/18/2016      PT End of Session - 06/18/16 1434    Visit Number 2   Date for PT Re-Evaluation 08/16/16   PT Start Time 4818   PT Stop Time 1445   PT Time Calculation (min) 57 min   Activity Tolerance Patient tolerated treatment well   Behavior During Therapy [facility] for tasks assessed/performed      Past Medical History:  Diagnosis Date  . Anxiety   . Arthritis    "neck and lower back" (03/25/2016)  . Asthma 1990s X 1   "short term inhaler use"   . Chronic lower back pain   . Degenerative disorder of bone   . Depression   . Diastolic dysfunction   . Drug-induced lupus erythematosus    HCTZ induced; "still gettin over it" (03/25/2016)  . GERD (gastroesophageal reflux disease)   . Herniated disc, cervical   . Hyperlipidemia   . Hypertension   . Neuromuscular disorder ([place])    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 HTN  . Orthostatic hypotension   . OSA on CPAP   . Osteopenia   . PAF (paroxysmal atrial fibrillation) ([place])   . Pinched nerve in neck   . T12 compression fracture ([place]) 11/2015  . Vitamin D deficiency   . Whiplash injury 06/07/2010    Past Surgical History:  Procedure Laterality Date  . APPENDECTOMY  1990s  . ATRIAL FIBRILLATION ABLATION N/A 03/25/2016   Procedure: Atrial Fibrillation Ablation;  Surgeon: [name], MD;  Location: [place] [facility];  Service: Cardiovascular;  Laterality: N/A;  . FOREARM FRACTURE SURGERY Left ~ 02/2011   "broke arm; shattered wrist"  . FOREARM HARDWARE REMOVAL Right ~ 07/2011  . FRACTURE SURGERY    . TEE WITHOUT CARDIOVERSION N/A 03/24/2016   Procedure: TRANSESOPHAGEAL ECHOCARDIOGRAM (TEE);   Surgeon: Sanda Klein, MD;  Location: [facility] ENDOSCOPY;  Service: Cardiovascular;  Laterality: N/A;    There were no vitals filed for this visit.      Subjective Assessment - 06/18/16 1348    Subjective Patient reports that she felt really pretty good the rest of the day and the next after the treatment.  She reports that the exercises were good.  She reports pain is back today.   Currently in Pain? Yes   Pain Score 3    Pain Location Back   Pain Orientation Mid;Lower;Upper                         [facility] Adult PT Treatment/Exercise - 06/18/16 0001      Exercises   Exercises Lumbar     Lumbar Exercises: Aerobic   Elliptical NuStep Level 3 x 5 minutes     Lumbar Exercises: Machines for Strengthening   Cybex Knee Extension 5# 2x10   Cybex Knee Flexion 25# 2x10   Other Lumbar Machine Exercise seated row 10# 2x10, lat pulls 15# 2x10   Other Lumbar Machine Exercise straight arm pull down 20# 2x10     Lumbar Exercises: Seated   Sit to Stand 20 reps   Sit to Stand Limitations holding 5# weight     Lumbar Exercises: Supine   Other Supine Lumbar  Exercises feet on ball K2C, trunk rotation, small bridges and isometric abdominals     Moist Heat Therapy   Number Minutes Moist Heat 15 Minutes   Moist Heat Location Lumbar Spine     Electrical Stimulation   Electrical Stimulation Location T/L area   Electrical Stimulation Action IFC   Electrical Stimulation Parameters supine   Electrical Stimulation Goals Pain                  PT Short Term Goals - 06/16/16 1042      PT SHORT TERM GOAL #1   Title independent with initial HEP   Time 2   Period Weeks   Status New           PT Long Term Goals - 06/16/16 1042      PT LONG TERM GOAL #1   Title understand proper posture and body mechanics   Time 8   Period Weeks   Status New     PT LONG TERM GOAL #2   Title decrease pain 50%   Time 8   Period Weeks   Status New     PT LONG TERM GOAL #3    Title increase lumbar ROM 50%   Time 8   Period Weeks   Status New     PT LONG TERM GOAL #4   Title vaccum 2 rooms without pain >6/10   Time 8   Period Weeks   Status New               Plan - 06/18/16 1434    Clinical Impression Statement Patient doing very well with exercises today but will have to see if she is sore and how long that last since she reports to [place] that she has not done anything since October.   PT Next Visit Plan monitor soreness and adjust accordingly   Consulted and Agree with Plan of Care Patient      Patient will benefit from skilled therapeutic intervention in order to improve the following deficits and impairments:  Decreased activity tolerance, Decreased balance, Decreased mobility, Decreased strength, Postural dysfunction, Improper body mechanics, Impaired flexibility, Pain, Increased muscle spasms, Decreased range of motion  Visit Diagnosis: Pain in thoracic spine  Acute bilateral low back pain without sciatica  Muscle spasm of back  Muscle weakness (generalized)  Difficulty in walking, not elsewhere classified     Problem List Patient Active Problem List   Diagnosis Date Noted  . Chronic fatigue 05/27/2016  . A-fib ([place]) 03/25/2016  . Typical atrial flutter ([place])   . Chronic diastolic CHF (congestive heart failure) ([place]) 12/08/2015  . Elevated troponin 11/24/2015  . Atrial fibrillation with RVR ([place])   . Thoracic compression fracture ([place]) 11/16/2015  . Orthostatic hypotension 11/15/2015  . Osteopenia 09/12/2015  . Vitamin D deficiency 08/29/2015  . OSA (obstructive sleep apnea) 03/04/2015  . Polymyalgia ([place]) 11/22/2014  . Essential hypertension 08/21/2014  . Anxiety and depression 08/21/2014  . Hip stiffness 08/21/2014    Sumner Boast., PT 06/18/2016, 2:36 PM  [address] [place], [place], [address] Phone: [phone]   Fax:   [phone]  Name: Shannon [name] MRN: [id number] Date of Birth: Mar 08, 1950

## 2016-06-23 ENCOUNTER — Encounter: Payer: Self-pay | Admitting: Physical Therapy

## 2016-06-23 ENCOUNTER — Ambulatory Visit: Payer: Medicare Other | Admitting: Physical Therapy

## 2016-06-23 DIAGNOSIS — M6281 Muscle weakness (generalized): Secondary | ICD-10-CM | POA: Diagnosis not present

## 2016-06-23 DIAGNOSIS — R262 Difficulty in walking, not elsewhere classified: Secondary | ICD-10-CM

## 2016-06-23 DIAGNOSIS — M62830 Muscle spasm of back: Secondary | ICD-10-CM

## 2016-06-23 DIAGNOSIS — M546 Pain in thoracic spine: Secondary | ICD-10-CM

## 2016-06-23 DIAGNOSIS — M545 Low back pain, unspecified: Secondary | ICD-10-CM

## 2016-06-23 NOTE — Therapy (Signed)
[place], [place], [address] Phone: [phone]   Fax:  [phone]  Physical Therapy Treatment  Patient Details  Name: Shannon [name] MRN: [id number] Date of Birth: 07-15-1950 Referring Provider: Elyn Aquas  Encounter Date: 06/23/2016      PT End of Session - 06/23/16 1424    Visit Number 3   Date for PT Re-Evaluation 08/16/16   PT Start Time 1700   PT Stop Time 1500   PT Time Calculation (min) 63 min   Activity Tolerance Patient tolerated treatment well   Behavior During Therapy [facility] for tasks assessed/performed      Past Medical History:  Diagnosis Date  . Anxiety   . Arthritis    "neck and lower back" (03/25/2016)  . Asthma 1990s X 1   "short term inhaler use"   . Chronic lower back pain   . Degenerative disorder of bone   . Depression   . Diastolic dysfunction   . Drug-induced lupus erythematosus    HCTZ induced; "still gettin over it" (03/25/2016)  . GERD (gastroesophageal reflux disease)   . Herniated disc, cervical   . Hyperlipidemia   . Hypertension   . Neuromuscular disorder ([place])    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 HTN  . Orthostatic hypotension   . OSA on CPAP   . Osteopenia   . PAF (paroxysmal atrial fibrillation) ([place])   . Pinched nerve in neck   . T12 compression fracture ([place]) 11/2015  . Vitamin D deficiency   . Whiplash injury 06/07/2010    Past Surgical History:  Procedure Laterality Date  . APPENDECTOMY  1990s  . ATRIAL FIBRILLATION ABLATION N/A 03/25/2016   Procedure: Atrial Fibrillation Ablation;  Surgeon: [name], MD;  Location: [place] [facility];  Service: Cardiovascular;  Laterality: N/A;  . FOREARM FRACTURE SURGERY Left ~ 02/2011   "broke arm; shattered wrist"  . FOREARM HARDWARE REMOVAL Right ~ 07/2011  . FRACTURE SURGERY    . TEE WITHOUT CARDIOVERSION N/A 03/24/2016   Procedure: TRANSESOPHAGEAL ECHOCARDIOGRAM (TEE);   Surgeon: Sanda Klein, MD;  Location: [facility] ENDOSCOPY;  Service: Cardiovascular;  Laterality: N/A;    There were no vitals filed for this visit.      Subjective Assessment - 06/23/16 1401    Subjective I was sore for a few days, Just mms so I think it was okay.   Currently in Pain? Yes   Pain Score 3    Pain Location Back   Pain Orientation Mid;Lower;Upper   Pain Descriptors / Indicators Sore                         [facility] Adult PT Treatment/Exercise - 06/23/16 0001      Lumbar Exercises: Stretches   Passive Hamstring Stretch 3 reps;20 seconds   Piriformis Stretch 4 reps;20 seconds     Lumbar Exercises: Aerobic   Elliptical NuStep Level 3 x 5 minutes     Lumbar Exercises: Machines for Strengthening   Cybex Knee Extension 5# 2x10   Cybex Knee Flexion 25# 2x10   Other Lumbar Machine Exercise seated row 10# 2x10, lat pulls 15# 2x10   Other Lumbar Machine Exercise straight arm pull down 20# 2x10     Lumbar Exercises: Seated   Sit to Stand 20 reps   Sit to Stand Limitations holding 5# weight     Lumbar Exercises: Supine   Other Supine  Lumbar Exercises feet on ball K2C, trunk rotation, small bridges and isometric abdominals     Moist Heat Therapy   Number Minutes Moist Heat 15 Minutes   Moist Heat Location Lumbar Spine     Electrical Stimulation   Electrical Stimulation Location T/L area   Electrical Stimulation Action IFC   Electrical Stimulation Parameters supine   Electrical Stimulation Goals Pain                  PT Short Term Goals - 06/23/16 1426      PT SHORT TERM GOAL #1   Title independent with initial HEP   Status Achieved           PT Long Term Goals - 06/16/16 1042      PT LONG TERM GOAL #1   Title understand proper posture and body mechanics   Time 8   Period Weeks   Status New     PT LONG TERM GOAL #2   Title decrease pain 50%   Time 8   Period Weeks   Status New     PT LONG TERM GOAL #3   Title increase  lumbar ROM 50%   Time 8   Period Weeks   Status New     PT LONG TERM GOAL #4   Title vaccum 2 rooms without pain >6/10   Time 8   Period Weeks   Status New               Plan - 06/23/16 1425    Clinical Impression Statement Tried to stay about the same on the exercises, added some stretches, since she was sore for a few days   PT Next Visit Plan monitor soreness and adjust accordingly   Consulted and Agree with Plan of Care Patient      Patient will benefit from skilled therapeutic intervention in order to improve the following deficits and impairments:  Decreased activity tolerance, Decreased balance, Decreased mobility, Decreased strength, Postural dysfunction, Improper body mechanics, Impaired flexibility, Pain, Increased muscle spasms, Decreased range of motion  Visit Diagnosis: Pain in thoracic spine  Acute bilateral low back pain without sciatica  Muscle spasm of back  Muscle weakness (generalized)  Difficulty in walking, not elsewhere classified     Problem List Patient Active Problem List   Diagnosis Date Noted  . Chronic fatigue 05/27/2016  . A-fib ([place]) 03/25/2016  . Typical atrial flutter ([place])   . Chronic diastolic CHF (congestive heart failure) ([place]) 12/08/2015  . Elevated troponin 11/24/2015  . Atrial fibrillation with RVR ([place])   . Thoracic compression fracture ([place]) 11/16/2015  . Orthostatic hypotension 11/15/2015  . Osteopenia 09/12/2015  . Vitamin D deficiency 08/29/2015  . OSA (obstructive sleep apnea) 03/04/2015  . Polymyalgia ([place]) 11/22/2014  . Essential hypertension 08/21/2014  . Anxiety and depression 08/21/2014  . Hip stiffness 08/21/2014    Sumner Boast., PT 06/23/2016, 2:42 PM  [place] [address] [address] [place], [place], [address] Phone: [phone]   Fax:  [phone]  Name: Shannon [name] MRN: [id number] Date of Birth: 1950-07-03

## 2016-06-26 ENCOUNTER — Other Ambulatory Visit: Payer: Self-pay | Admitting: Physician Assistant

## 2016-06-26 ENCOUNTER — Encounter: Payer: Self-pay | Admitting: Physical Therapy

## 2016-06-26 ENCOUNTER — Other Ambulatory Visit: Payer: Self-pay | Admitting: Emergency Medicine

## 2016-06-26 ENCOUNTER — Ambulatory Visit: Payer: Medicare Other | Admitting: Physical Therapy

## 2016-06-26 DIAGNOSIS — R748 Abnormal levels of other serum enzymes: Secondary | ICD-10-CM

## 2016-06-26 DIAGNOSIS — M62830 Muscle spasm of back: Secondary | ICD-10-CM

## 2016-06-26 DIAGNOSIS — D508 Other iron deficiency anemias: Secondary | ICD-10-CM

## 2016-06-26 DIAGNOSIS — M6281 Muscle weakness (generalized): Secondary | ICD-10-CM | POA: Diagnosis not present

## 2016-06-26 DIAGNOSIS — M545 Low back pain, unspecified: Secondary | ICD-10-CM

## 2016-06-26 DIAGNOSIS — R262 Difficulty in walking, not elsewhere classified: Secondary | ICD-10-CM

## 2016-06-26 DIAGNOSIS — M546 Pain in thoracic spine: Secondary | ICD-10-CM

## 2016-06-26 DIAGNOSIS — D649 Anemia, unspecified: Secondary | ICD-10-CM

## 2016-06-26 MED ORDER — IRON 325 (65 FE) MG PO TABS: 1.0000 | ORAL_TABLET | Freq: Every day | ORAL | 2 refills | Status: DC

## 2016-06-26 MED ORDER — CYANOCOBALAMIN 500 MCG PO TABS: 500.0000 ug | ORAL_TABLET | Freq: Every day | ORAL | 2 refills | Status: DC

## 2016-06-26 MED ORDER — IRON 325 (65 FE) MG PO TABS: 1.0000 | ORAL_TABLET | Freq: Every day | ORAL | 0 refills | Status: DC

## 2016-06-26 NOTE — Therapy (Signed)
[place], [place], [address] Phone: [phone]   Fax:  [phone]  Physical Therapy Treatment  Patient Details  Name: Shannon [name] MRN: [id number] Date of Birth: 07/22/1950 Referring Provider: Elyn Aquas  Encounter Date: 06/26/2016      PT End of Session - 06/26/16 1518    Visit Number 4   Date for PT Re-Evaluation 08/16/16   PT Start Time 1440   PT Stop Time 1538   PT Time Calculation (min) 58 min   Activity Tolerance Patient tolerated treatment well   Behavior During Therapy [facility] for tasks assessed/performed      Past Medical History:  Diagnosis Date  . Anxiety   . Arthritis    "neck and lower back" (03/25/2016)  . Asthma 1990s X 1   "short term inhaler use"   . Chronic lower back pain   . Degenerative disorder of bone   . Depression   . Diastolic dysfunction   . Drug-induced lupus erythematosus    HCTZ induced; "still gettin over it" (03/25/2016)  . GERD (gastroesophageal reflux disease)   . Herniated disc, cervical   . Hyperlipidemia   . Hypertension   . Neuromuscular disorder ([place])    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 HTN  . Orthostatic hypotension   . OSA on CPAP   . Osteopenia   . PAF (paroxysmal atrial fibrillation) ([place])   . Pinched nerve in neck   . T12 compression fracture ([place]) 11/2015  . Vitamin D deficiency   . Whiplash injury 06/07/2010    Past Surgical History:  Procedure Laterality Date  . APPENDECTOMY  1990s  . ATRIAL FIBRILLATION ABLATION N/A 03/25/2016   Procedure: Atrial Fibrillation Ablation;  Surgeon: [name], MD;  Location: [place] [facility];  Service: Cardiovascular;  Laterality: N/A;  . FOREARM FRACTURE SURGERY Left ~ 02/2011   "broke arm; shattered wrist"  . FOREARM HARDWARE REMOVAL Right ~ 07/2011  . FRACTURE SURGERY    . TEE WITHOUT CARDIOVERSION N/A 03/24/2016   Procedure: TRANSESOPHAGEAL ECHOCARDIOGRAM (TEE);   Surgeon: Sanda Klein, MD;  Location: [facility] ENDOSCOPY;  Service: Cardiovascular;  Laterality: N/A;    There were no vitals filed for this visit.      Subjective Assessment - 06/26/16 1443    Subjective I was better after the last treatment   Currently in Pain? Yes   Pain Score 3    Pain Location Back   Pain Orientation Mid;Lower;Upper   Pain Relieving Factors treatment here helps                         [facility] Adult PT Treatment/Exercise - 06/26/16 0001      Lumbar Exercises: Stretches   Piriformis Stretch 4 reps;20 seconds   Piriformis Stretch Limitations Gastroc stretches     Lumbar Exercises: Aerobic   Elliptical NuStep Level 4 x 5 minutes     Lumbar Exercises: Machines for Strengthening   Cybex Knee Extension 5# 2x10   Cybex Knee Flexion 25# 2x10   Other Lumbar Machine Exercise seated row 10# 2x10, lat pulls 15# 2x10   Other Lumbar Machine Exercise straight arm pull down 20# 2x10     Lumbar Exercises: Seated   Sit to Stand 20 reps   Sit to Stand Limitations holding 5# weight     Lumbar Exercises: Supine   Other Supine Lumbar Exercises feet on ball K2C, trunk rotation,  small bridges and isometric abdominals     Moist Heat Therapy   Number Minutes Moist Heat 15 Minutes   Moist Heat Location Lumbar Spine     Electrical Stimulation   Electrical Stimulation Location T/L area   Electrical Stimulation Action IFC   Electrical Stimulation Parameters supine   Electrical Stimulation Goals Pain                  PT Short Term Goals - 06/23/16 1426      PT SHORT TERM GOAL #1   Title independent with initial HEP   Status Achieved           PT Long Term Goals - 06/16/16 1042      PT LONG TERM GOAL #1   Title understand proper posture and body mechanics   Time 8   Period Weeks   Status New     PT LONG TERM GOAL #2   Title decrease pain 50%   Time 8   Period Weeks   Status New     PT LONG TERM GOAL #3   Title increase lumbar ROM  50%   Time 8   Period Weeks   Status New     PT LONG TERM GOAL #4   Title vaccum 2 rooms without pain >6/10   Time 8   Period Weeks   Status New               Plan - 06/26/16 1519    Clinical Impression Statement Patient doing well and reports feeling better, I did not add anything except increased level and time on the N[place], everything else was the same   PT Next Visit Plan increase exercises next week   Consulted and Agree with Plan of Care Patient      Patient will benefit from skilled therapeutic intervention in order to improve the following deficits and impairments:  Decreased activity tolerance, Decreased balance, Decreased mobility, Decreased strength, Postural dysfunction, Improper body mechanics, Impaired flexibility, Pain, Increased muscle spasms, Decreased range of motion  Visit Diagnosis: Pain in thoracic spine  Acute bilateral low back pain without sciatica  Muscle spasm of back  Muscle weakness (generalized)  Difficulty in walking, not elsewhere classified     Problem List Patient Active Problem List   Diagnosis Date Noted  . Chronic fatigue 05/27/2016  . A-fib ([place]) 03/25/2016  . Typical atrial flutter ([place])   . Chronic diastolic CHF (congestive heart failure) ([place]) 12/08/2015  . Elevated troponin 11/24/2015  . Atrial fibrillation with RVR ([place])   . Thoracic compression fracture ([place]) 11/16/2015  . Orthostatic hypotension 11/15/2015  . Osteopenia 09/12/2015  . Vitamin D deficiency 08/29/2015  . OSA (obstructive sleep apnea) 03/04/2015  . Polymyalgia ([place]) 11/22/2014  . Essential hypertension 08/21/2014  . Anxiety and depression 08/21/2014  . Hip stiffness 08/21/2014    Sumner Boast., PT 06/26/2016, 3:20 PM  [address] [address] [place], [place], [address] Phone: [phone]   Fax:  [phone]  Name: Janica Eldred MRN: [id number] Date of Birth:  02-02-1950

## 2016-06-30 ENCOUNTER — Ambulatory Visit (INDEPENDENT_AMBULATORY_CARE_PROVIDER_SITE_OTHER): Payer: Medicare Other | Admitting: Internal Medicine

## 2016-06-30 VITALS — BP 140/80 | HR 69 | Ht 67.0 in | Wt 216.2 lb

## 2016-06-30 DIAGNOSIS — Z9889 Other specified postprocedural states: Secondary | ICD-10-CM | POA: Diagnosis not present

## 2016-06-30 DIAGNOSIS — Z8679 Personal history of other diseases of the circulatory system: Secondary | ICD-10-CM | POA: Diagnosis not present

## 2016-06-30 DIAGNOSIS — I10 Essential (primary) hypertension: Secondary | ICD-10-CM

## 2016-06-30 DIAGNOSIS — G4733 Obstructive sleep apnea (adult) (pediatric): Secondary | ICD-10-CM

## 2016-06-30 DIAGNOSIS — I4891 Unspecified atrial fibrillation: Secondary | ICD-10-CM | POA: Diagnosis not present

## 2016-06-30 DIAGNOSIS — Z9989 Dependence on other enabling machines and devices: Secondary | ICD-10-CM

## 2016-06-30 NOTE — Progress Notes (Signed)
PCP: Brunetta Jeans, PA-C Primary EP: Dr Harland German Shannon [name] is a 66 y.o. female who presents today for routine electrophysiology followup.  Since his recent afib ablation, the patient reports doing very well.  she denies procedure related complications and is pleased with the results of the procedure.  Today, she denies symptoms of palpitations, chest pain, shortness of breath,  lower extremity edema, dizziness, presyncope, or syncope.  The patient is otherwise without complaint today.   Past Medical History:  Diagnosis Date  . Anxiety   . Arthritis    "neck and lower back" (03/25/2016)  . Asthma 1990s X 1   "short term inhaler use"   . Chronic lower back pain   . Degenerative disorder of bone   . Depression   . Diastolic dysfunction   . Drug-induced lupus erythematosus    HCTZ induced; "still gettin over it" (03/25/2016)  . GERD (gastroesophageal reflux disease)   . Herniated disc, cervical   . Hyperlipidemia   . Hypertension   . Neuromuscular disorder ([place])    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 HTN  . Orthostatic hypotension   . OSA on CPAP   . Osteopenia   . PAF (paroxysmal atrial fibrillation) ([place])   . Pinched nerve in neck   . T12 compression fracture ([place]) 11/2015  . Vitamin D deficiency   . Whiplash injury 06/07/2010   Past Surgical History:  Procedure Laterality Date  . APPENDECTOMY  1990s  . ATRIAL FIBRILLATION ABLATION N/A 03/25/2016   Procedure: Atrial Fibrillation Ablation;  Surgeon: [name], MD;  Location: [place] CV LAB;  Service: Cardiovascular;  Laterality: N/A;  . FOREARM FRACTURE SURGERY Left ~ 02/2011   "broke arm; shattered wrist"  . FOREARM HARDWARE REMOVAL Right ~ 07/2011  . FRACTURE SURGERY    . TEE WITHOUT CARDIOVERSION N/A 03/24/2016   Procedure: TRANSESOPHAGEAL ECHOCARDIOGRAM (TEE);  Surgeon: Sanda Klein, MD;  Location: [facility] ENDOSCOPY;  Service: Cardiovascular;  Laterality: N/A;    ROS- all systems are  personally reviewed and negatives except as per HPI above  Current Outpatient Prescriptions  Medication Sig Dispense Refill  . acetaminophen (TYLENOL) 650 MG CR tablet Take 650-1,300 mg by mouth every 8 (eight) hours as needed for pain.     Marland Kitchen ALPRAZolam (XANAX) 0.5 MG tablet TAKE ONE TABLET BY MOUTH THREE TIMES DAILY AS NEEDED FOR ANXIETY 30 tablet 0  . amLODipine (NORVASC) 5 MG tablet Take 1 tablet (5 mg total) by mouth daily.    . cholecalciferol (VITAMIN D) 1000 units tablet Take 1,000 Units by mouth daily.    . cyanocobalamin 500 MCG tablet Take 1 tablet (500 mcg total) by mouth daily. 90 tablet 2  . DULoxetine (CYMBALTA) 60 MG capsule Take 1 capsule (60 mg total) by mouth daily. 30 capsule 1  . Ferrous Sulfate (IRON) 325 (65 Fe) MG TABS Take 1 tablet by mouth daily. 90 each 2  . Multiple Vitamins-Minerals (MULTIVITAMIN) tablet Take 1 tablet by mouth daily. 30 tablet 0  . rivaroxaban (XARELTO) 20 MG TABS tablet Take 20 mg by mouth daily with supper.    . carvedilol (COREG) 12.5 MG tablet Take 1 tablet (12.5 mg total) by mouth 2 (two) times daily. 180 tablet 1   No current facility-administered medications for this visit.     Physical Exam: Vitals:   06/30/16 1200  BP: 140/80  Pulse: 69  Weight: 216 lb 3.2 oz (98.1 kg)  Height: 5\' 7"  (1.702 m)  GEN- The patient is well appearing, alert and oriented x 3 today.   Head- normocephalic, atraumatic Eyes-  Sclera clear, conjunctiva pink Ears- hearing intact Oropharynx- clear Lungs- Clear to ausculation bilaterally, normal work of breathing Heart- Regular rate and rhythm, no murmurs, rubs or gallops, PMI not laterally displaced GI- soft, NT, ND, + BS Extremities- no clubbing, cyanosis, or edema  EKG tracing ordered today is personally reviewed and shows sinus rhythm  Assessment and Plan:  1. Persistent/ paroxysmal atrial fibrillation Doing well s/p ablation chads2vasc score is 3. Continue long term anticoagulation  2.  HTN Stable No change required today  3. OSA Compliance with CPAP encouarged  4. Overweight Body mass index is 33.86 kg/m. Lifestyle modification encouraged  Return to see Butch Penny in the AF clinic in 3 months Dr Lovena Le to see in 6 months I will see when needed   [name] MD, [facility] 06/30/2016 12:18 PM

## 2016-06-30 NOTE — Patient Instructions (Addendum)
Medication Instructions:  Your physician recommends that you continue on your current medications as directed. Please refer to the Current Medication list given to you today.  Labwork: None ordered  Testing/Procedures: None ordered  Follow-Up: Your physician recommends that you schedule a follow-up appointment in:  Afib clinic with Roderic Palau, NP in 3 months.  Follow up with Dr. Lovena Le in 6 months.   Any Other Special Instructions Will Be Listed Below (If Applicable).     If you need a refill on your cardiac medications before your next appointment, please call your pharmacy.

## 2016-07-01 ENCOUNTER — Encounter: Payer: Self-pay | Admitting: Physical Therapy

## 2016-07-01 ENCOUNTER — Ambulatory Visit: Payer: Medicare Other | Admitting: Physical Therapy

## 2016-07-01 DIAGNOSIS — M545 Low back pain, unspecified: Secondary | ICD-10-CM

## 2016-07-01 DIAGNOSIS — M6281 Muscle weakness (generalized): Secondary | ICD-10-CM

## 2016-07-01 DIAGNOSIS — R262 Difficulty in walking, not elsewhere classified: Secondary | ICD-10-CM | POA: Diagnosis not present

## 2016-07-01 DIAGNOSIS — M62830 Muscle spasm of back: Secondary | ICD-10-CM | POA: Diagnosis not present

## 2016-07-01 DIAGNOSIS — M546 Pain in thoracic spine: Secondary | ICD-10-CM

## 2016-07-01 NOTE — Therapy (Signed)
[address] [place], [place], [address] Phone: [phone]   Fax:  [phone]  Physical Therapy Treatment  Patient Details  Name: Shannon [name] MRN: [id number] Date of Birth: Oct 13, 1950 Referring Provider: Elyn Aquas  Encounter Date: 07/01/2016      PT End of Session - 07/01/16 1126    Visit Number 5   PT Start Time 1055   PT Stop Time 1150   PT Time Calculation (min) 55 min      Past Medical History:  Diagnosis Date  . Anxiety   . Arthritis    "neck and lower back" (03/25/2016)  . Asthma 1990s X 1   "short term inhaler use"   . Chronic lower back pain   . Degenerative disorder of bone   . Depression   . Diastolic dysfunction   . Drug-induced lupus erythematosus    HCTZ induced; "still gettin over it" (03/25/2016)  . GERD (gastroesophageal reflux disease)   . Herniated disc, cervical   . Hyperlipidemia   . Hypertension   . Neuromuscular disorder ([place])    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 HTN  . Orthostatic hypotension   . OSA on CPAP   . Osteopenia   . PAF (paroxysmal atrial fibrillation) ([place])   . Pinched nerve in neck   . T12 compression fracture ([place]) 11/2015  . Vitamin D deficiency   . Whiplash injury 06/07/2010    Past Surgical History:  Procedure Laterality Date  . APPENDECTOMY  1990s  . ATRIAL FIBRILLATION ABLATION N/A 03/25/2016   Procedure: Atrial Fibrillation Ablation;  Surgeon: [name], MD;  Location: [facility];  Service: Cardiovascular;  Laterality: N/A;  . FOREARM FRACTURE SURGERY Left ~ 02/2011   "broke arm; shattered wrist"  . FOREARM HARDWARE REMOVAL Right ~ 07/2011  . FRACTURE SURGERY    . TEE WITHOUT CARDIOVERSION N/A 03/24/2016   Procedure: TRANSESOPHAGEAL ECHOCARDIOGRAM (TEE);  Surgeon: Sanda Klein, MD;  [facility]: [facility] ENDOSCOPY;  Service: Cardiovascular;  Laterality: N/A;    There were no vitals filed for this visit.       Subjective Assessment - 07/01/16 1100    Subjective went to [employer] yesterday- I think I used too much weight. I brought pictures   Currently in Pain? Yes   Pain Score 2    Pain Location Back                         [facility] Adult PT Treatment/Exercise - 07/01/16 0001      Lumbar Exercises: Aerobic   Stationary Bike Ellliptical I 4 R 3 2 min   Elliptical NuStep Level 5 x 6 minutes     Lumbar Exercises: Machines for Strengthening   Cybex Lumbar Extension black tband 2 sets 10   Cybex Knee Extension 5# 2x10   Cybex Knee Flexion 25# 2x10   Other Lumbar Machine Exercise lat pull 15 # 2 sets 10     Lumbar Exercises: Standing   Other Standing Lumbar Exercises 5# pulley ext and retraction  15 each   Other Standing Lumbar Exercises pulley hip ext and abd 15 each 5#     Moist Heat Therapy   Number Minutes Moist Heat 15 Minutes   Moist Heat Location Lumbar Spine     Electrical Stimulation   Electrical Stimulation Location T/L area   Electrical Stimulation Action IFC   Electrical Stimulation Parameters supine  Electrical Stimulation Goals Pain                PT Education - 07/01/16 1115    Education provided Yes   Education Details educated on gym ex safety and progression based off pictures pt had on cell phone from gym   Person(s) Educated Patient   Methods Explanation;Verbal cues   Comprehension Verbalized understanding          PT Short Term Goals - 06/23/16 1426      PT SHORT TERM GOAL #1   Title independent with initial HEP   Status Achieved           PT Long Term Goals - 06/16/16 1042      PT LONG TERM GOAL #1   Title understand proper posture and body mechanics   Time 8   Period Weeks   Status New     PT LONG TERM GOAL #2   Title decrease pain 50%   Time 8   Period Weeks   Status New     PT LONG TERM GOAL #3   Title increase lumbar ROM 50%   Time 8   Period Weeks   Status New     PT LONG TERM GOAL #4   Title  vaccum 2 rooms without pain >6/10   Time 8   Period Weeks   Status New               Plan - 07/01/16 1127    Clinical Impression Statement some increased pain and shaking with additional ex today but overall tolerated well and pt morivated to get stronger and slowly transitioning into apt gym- educated provided today   PT Next Visit Plan assess additional ex and progress      Patient will benefit from skilled therapeutic intervention in order to improve the following deficits and impairments:     Visit Diagnosis: Pain in thoracic spine  Acute bilateral low back pain without sciatica  Muscle spasm of back  Muscle weakness (generalized)     Problem List Patient Active Problem List   Diagnosis Date Noted  . Chronic fatigue 05/27/2016  . A-fib ([place]) 03/25/2016  . Typical atrial flutter ([place])   . Chronic diastolic CHF (congestive heart failure) ([place]) 12/08/2015  . Elevated troponin 11/24/2015  . Atrial fibrillation with RVR ([place])   . Thoracic compression fracture ([place]) 11/16/2015  . Orthostatic hypotension 11/15/2015  . Osteopenia 09/12/2015  . Vitamin D deficiency 08/29/2015  . OSA (obstructive sleep apnea) 03/04/2015  . Polymyalgia ([place]) 11/22/2014  . Essential hypertension 08/21/2014  . Anxiety and depression 08/21/2014  . Hip stiffness 08/21/2014    PAYSEUR,ANGIE PTA 07/01/2016, 11:29 AM  [address] [place], [place], [address] Phone: [phone]   Fax:  [phone]  Name: Shannon [name] MRN: [id number] Date of Birth: 1950/06/18

## 2016-07-01 NOTE — Telephone Encounter (Signed)
error 

## 2016-07-03 ENCOUNTER — Encounter: Payer: Self-pay | Admitting: Physical Therapy

## 2016-07-03 ENCOUNTER — Ambulatory Visit: Payer: Medicare Other | Admitting: Physical Therapy

## 2016-07-03 DIAGNOSIS — M545 Low back pain, unspecified: Secondary | ICD-10-CM

## 2016-07-03 DIAGNOSIS — M6281 Muscle weakness (generalized): Secondary | ICD-10-CM

## 2016-07-03 DIAGNOSIS — M546 Pain in thoracic spine: Secondary | ICD-10-CM | POA: Diagnosis not present

## 2016-07-03 DIAGNOSIS — M62830 Muscle spasm of back: Secondary | ICD-10-CM

## 2016-07-03 DIAGNOSIS — R262 Difficulty in walking, not elsewhere classified: Secondary | ICD-10-CM

## 2016-07-03 NOTE — Therapy (Signed)
[address] [place], [place], [address] Phone: [phone]   Fax:  [phone]  Physical Therapy Treatment  Patient Details  Name: Shannon [name] MRN: [id number] Date of Birth: Jan 16, 1950 Referring Provider: Elyn Aquas  Encounter Date: 07/03/2016      PT End of Session - 07/03/16 1039    Visit Number 6   Date for PT Re-Evaluation 08/16/16   PT Start Time 6144   PT Stop Time 1110   PT Time Calculation (min) 55 min      Past Medical History:  Diagnosis Date  . Anxiety   . Arthritis    "neck and lower back" (03/25/2016)  . Asthma 1990s X 1   "short term inhaler use"   . Chronic lower back pain   . Degenerative disorder of bone   . Depression   . Diastolic dysfunction   . Drug-induced lupus erythematosus    HCTZ induced; "still gettin over it" (03/25/2016)  . GERD (gastroesophageal reflux disease)   . Herniated disc, cervical   . Hyperlipidemia   . Hypertension   . Neuromuscular disorder ([place])    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 HTN  . Orthostatic hypotension   . OSA on CPAP   . Osteopenia   . PAF (paroxysmal atrial fibrillation) ([place])   . Pinched nerve in neck   . T12 compression fracture ([place]) 11/2015  . Vitamin D deficiency   . Whiplash injury 06/07/2010    Past Surgical History:  Procedure Laterality Date  . APPENDECTOMY  1990s  . ATRIAL FIBRILLATION ABLATION N/A 03/25/2016   Procedure: Atrial Fibrillation Ablation;  Surgeon: [name], MD;  Location: [facility];  Service: Cardiovascular;  Laterality: N/A;  . FOREARM FRACTURE SURGERY Left ~ 02/2011   "broke arm; shattered wrist"  . FOREARM HARDWARE REMOVAL Right ~ 07/2011  . FRACTURE SURGERY    . TEE WITHOUT CARDIOVERSION N/A 03/24/2016   Procedure: TRANSESOPHAGEAL ECHOCARDIOGRAM (TEE);  Surgeon: Sanda Klein, MD;  Location: [facility] ENDOSCOPY;  Service: Cardiovascular;  Laterality: N/A;    There were  no vitals filed for this visit.      Subjective Assessment - 07/03/16 1013    Subjective sore ,but a good sore after last session   Currently in Pain? Yes   Pain Score 2    Pain Location Back                         [facility] Adult PT Treatment/Exercise - 07/03/16 0001      Lumbar Exercises: Aerobic   Stationary Bike Ellliptical I 4 R 3 3 min   UBE (Upper Arm Bike) standing 1.5 fwd and 1.5 back L 3     Lumbar Exercises: Machines for Strengthening   Cybex Lumbar Extension black tband 2 sets 10   Cybex Knee Extension 5# 2x15   Cybex Knee Flexion 25# 2x15   Other Lumbar Machine Exercise lat pull 15 # 2 sets 15  seated row 15# 2 sets 15   Other Lumbar Machine Exercise wt ball obl 15 each     Lumbar Exercises: Standing   Other Standing Lumbar Exercises step up fwd and sw with opp leg mvmt 2 HHA 6" 15 times each     Moist Heat Therapy   Number Minutes Moist Heat 15 Minutes   Moist Heat Location Lumbar Spine     Electrical Stimulation   Electrical Stimulation Location  T/L area   Electrical Stimulation Action IFC   Electrical Stimulation Parameters supine   Electrical Stimulation Goals Pain                  PT Short Term Goals - 06/23/16 1426      PT SHORT TERM GOAL #1   Title independent with initial HEP   Status Achieved           PT Long Term Goals - 07/03/16 1030      PT LONG TERM GOAL #1   Title understand proper posture and body mechanics   Status Partially Met     PT LONG TERM GOAL #2   Title decrease pain 50%   Baseline 25% better   Status Partially Met     PT LONG TERM GOAL #3   Title increase lumbar ROM 50%   Baseline lumbar ROM WFLs   Status Achieved     PT LONG TERM GOAL #4   Title vaccum 2 rooms without pain >6/10   Baseline have not tried but was able to load dishwasher, cooking dinner better with less rest   Status Partially Met               Plan - 07/03/16 1039    Clinical Impression Statement pt  tolerated increased reps today and some add'l stab ex without C/O pain some postural cuing needed. progressing with LTGs , ROM gaol met. pain decreasing and func increasing   PT Next Visit Plan assess additional ex and progress      Patient will benefit from skilled therapeutic intervention in order to improve the following deficits and impairments:     Visit Diagnosis: Pain in thoracic spine  Acute bilateral low back pain without sciatica  Muscle spasm of back  Muscle weakness (generalized)  Difficulty in walking, not elsewhere classified     Problem List Patient Active Problem List   Diagnosis Date Noted  . Chronic fatigue 05/27/2016  . A-fib ([place]) 03/25/2016  . Typical atrial flutter ([place])   . Chronic diastolic CHF (congestive heart failure) ([place]) 12/08/2015  . Elevated troponin 11/24/2015  . Atrial fibrillation with RVR ([place])   . Thoracic compression fracture ([place]) 11/16/2015  . Orthostatic hypotension 11/15/2015  . Osteopenia 09/12/2015  . Vitamin D deficiency 08/29/2015  . OSA (obstructive sleep apnea) 03/04/2015  . Polymyalgia ([place]) 11/22/2014  . Essential hypertension 08/21/2014  . Anxiety and depression 08/21/2014  . Hip stiffness 08/21/2014    Ashle Stief,ANGIE PTA 07/03/2016, 10:41 AM  [address] [place], [place], [address] Phone: [phone]   Fax:  [phone]  Name: Shannon [name] MRN: [id number] Date of Birth: August 10, 1950

## 2016-07-07 ENCOUNTER — Encounter: Payer: Self-pay | Admitting: Physical Therapy

## 2016-07-07 ENCOUNTER — Ambulatory Visit: Payer: Medicare Other | Attending: Family Medicine | Admitting: Physical Therapy

## 2016-07-07 DIAGNOSIS — M6281 Muscle weakness (generalized): Secondary | ICD-10-CM | POA: Insufficient documentation

## 2016-07-07 DIAGNOSIS — M545 Low back pain, unspecified: Secondary | ICD-10-CM

## 2016-07-07 DIAGNOSIS — M62830 Muscle spasm of back: Secondary | ICD-10-CM | POA: Diagnosis not present

## 2016-07-07 DIAGNOSIS — R262 Difficulty in walking, not elsewhere classified: Secondary | ICD-10-CM | POA: Insufficient documentation

## 2016-07-07 DIAGNOSIS — M546 Pain in thoracic spine: Secondary | ICD-10-CM | POA: Diagnosis not present

## 2016-07-07 DIAGNOSIS — M25552 Pain in left hip: Secondary | ICD-10-CM | POA: Diagnosis not present

## 2016-07-07 NOTE — Therapy (Signed)
[address] [place], [place], [address] Phone: [phone]   Fax:  [phone]  Physical Therapy Treatment  Patient Details  Name: Shannon [name] MRN: [id number] Date of Birth: 09/10/1950 Referring Provider: Elyn Aquas  Encounter Date: 07/07/2016      PT End of Session - 07/07/16 1442    Visit Number 7   Date for PT Re-Evaluation 08/16/16   PT Start Time 7209   PT Stop Time 1443   PT Time Calculation (min) 47 min   Activity Tolerance Patient tolerated treatment well   Behavior During Therapy [facility] for tasks assessed/performed      Past Medical History:  Diagnosis Date  . Anxiety   . Arthritis    "neck and lower back" (03/25/2016)  . Asthma 1990s X 1   "short term inhaler use"   . Chronic lower back pain   . Degenerative disorder of bone   . Depression   . Diastolic dysfunction   . Drug-induced lupus erythematosus    HCTZ induced; "still gettin over it" (03/25/2016)  . GERD (gastroesophageal reflux disease)   . Herniated disc, cervical   . Hyperlipidemia   . Hypertension   . Neuromuscular disorder ([place])    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 HTN  . Orthostatic hypotension   . OSA on CPAP   . Osteopenia   . PAF (paroxysmal atrial fibrillation) ([place])   . Pinched nerve in neck   . T12 compression fracture ([place]) 11/2015  . Vitamin D deficiency   . Whiplash injury 06/07/2010    Past Surgical History:  Procedure Laterality Date  . APPENDECTOMY  1990s  . ATRIAL FIBRILLATION ABLATION N/A 03/25/2016   Procedure: Atrial Fibrillation Ablation;  Surgeon: [name], MD;  Location: [facility];  Service: Cardiovascular;  Laterality: N/A;  . FOREARM FRACTURE SURGERY Left ~ 02/2011   "broke arm; shattered wrist"  . FOREARM HARDWARE REMOVAL Right ~ 07/2011  . FRACTURE SURGERY    . TEE WITHOUT CARDIOVERSION N/A 03/24/2016   Procedure: TRANSESOPHAGEAL ECHOCARDIOGRAM (TEE);   Surgeon: Sanda Klein, MD;  Location: [facility] ENDOSCOPY;  Service: Cardiovascular;  Laterality: N/A;    There were no vitals filed for this visit.      Subjective Assessment - 07/07/16 1400    Subjective "I was very busy over the weekend, I tolerated it better than I thought I could."   Currently in Pain? Yes   Pain Score 2    Pain Location Back                         [facility] Adult PT Treatment/Exercise - 07/07/16 0001      Lumbar Exercises: Aerobic   Stationary Bike Ellliptical I 4 R 3 3 min   Elliptical NuStep Level 5 x 6 minutes   UBE (Upper Arm Bike) standing 1.5 fwd and 1.5 back L 3     Lumbar Exercises: Machines for Strengthening   Cybex Knee Extension 5# 2x15   Cybex Knee Flexion 25# 2x15   Leg Press 20# 2x10   Other Lumbar Machine Exercise Lats, rows 15# 2x15   Other Lumbar Machine Exercise chest press 10#, triceps 20#, biceps 5# all 2x15.  Hip abduction and extension 5#                  PT Short Term Goals - 06/23/16 1426      PT SHORT  TERM GOAL #1   Title independent with initial HEP   Status Achieved           PT Long Term Goals - 07/03/16 1030      PT LONG TERM GOAL #1   Title understand proper posture and body mechanics   Status Partially Met     PT LONG TERM GOAL #2   Title decrease pain 50%   Baseline 25% better   Status Partially Met     PT LONG TERM GOAL #3   Title increase lumbar ROM 50%   Baseline lumbar ROM WFLs   Status Achieved     PT LONG TERM GOAL #4   Title vaccum 2 rooms without pain >6/10   Baseline have not tried but was able to load dishwasher, cooking dinner better with less rest   Status Partially Met               Plan - 07/07/16 1443    Clinical Impression Statement Patient did very well with the exercises, used her phone to show me her gym equipment where she lives and we tried to simulate them.  She reports that she is tolerating cooking again.   PT Next Visit Plan add exercises as  tolerated   Consulted and Agree with Plan of Care Patient      Patient will benefit from skilled therapeutic intervention in order to improve the following deficits and impairments:  Decreased activity tolerance, Decreased balance, Decreased mobility, Decreased strength, Postural dysfunction, Improper body mechanics, Impaired flexibility, Pain, Increased muscle spasms, Decreased range of motion  Visit Diagnosis: Pain in thoracic spine  Acute bilateral low back pain without sciatica  Muscle spasm of back     Problem List Patient Active Problem List   Diagnosis Date Noted  . Chronic fatigue 05/27/2016  . A-fib ([place]) 03/25/2016  . Typical atrial flutter ([place])   . Chronic diastolic CHF (congestive heart failure) ([place]) 12/08/2015  . Elevated troponin 11/24/2015  . Atrial fibrillation with RVR ([place])   . Thoracic compression fracture ([place]) 11/16/2015  . Orthostatic hypotension 11/15/2015  . Osteopenia 09/12/2015  . Vitamin D deficiency 08/29/2015  . OSA (obstructive sleep apnea) 03/04/2015  . Polymyalgia ([place]) 11/22/2014  . Essential hypertension 08/21/2014  . Anxiety and depression 08/21/2014  . Hip stiffness 08/21/2014    Sumner Boast., PT 07/07/2016, 2:45 PM  [address] [place], [place], [address] Phone: [phone]   Fax:  [phone]  Name: Shannon [name] MRN: [id number] Date of Birth: [birth year]

## 2016-07-08 ENCOUNTER — Other Ambulatory Visit (INDEPENDENT_AMBULATORY_CARE_PROVIDER_SITE_OTHER): Payer: Medicare Other

## 2016-07-08 DIAGNOSIS — D508 Other iron deficiency anemias: Secondary | ICD-10-CM

## 2016-07-08 DIAGNOSIS — R748 Abnormal levels of other serum enzymes: Secondary | ICD-10-CM

## 2016-07-08 LAB — CBC WITH DIFFERENTIAL/PLATELET
BASOS PCT: 1.1 % (ref 0.0–3.0)
Basophils Absolute: 0.1 10*3/uL (ref 0.0–0.1)
EOS ABS: 0.2 10*3/uL (ref 0.0–0.7)
Eosinophils Relative: 3.7 % (ref 0.0–5.0)
HEMATOCRIT: 34.8 % — AB (ref 36.0–46.0)
HEMOGLOBIN: 11.6 g/dL — AB (ref 12.0–15.0)
LYMPHS PCT: 27.9 % (ref 12.0–46.0)
Lymphs Abs: 1.8 10*3/uL (ref 0.7–4.0)
MCHC: 33.3 g/dL (ref 30.0–36.0)
MCV: 88 fl (ref 78.0–100.0)
MONO ABS: 0.5 10*3/uL (ref 0.1–1.0)
Monocytes Relative: 6.9 % (ref 3.0–12.0)
Neutro Abs: 4 10*3/uL (ref 1.4–7.7)
Neutrophils Relative %: 60.4 % (ref 43.0–77.0)
Platelets: 342 10*3/uL (ref 150.0–400.0)
RBC: 3.96 Mil/uL (ref 3.87–5.11)
RDW: 14 % (ref 11.5–15.5)
WBC: 6.5 10*3/uL (ref 4.0–10.5)

## 2016-07-08 LAB — GAMMA GT: GGT: 18 U/L (ref 7–51)

## 2016-07-10 ENCOUNTER — Ambulatory Visit: Payer: Medicare Other | Admitting: Physical Therapy

## 2016-07-10 DIAGNOSIS — R262 Difficulty in walking, not elsewhere classified: Secondary | ICD-10-CM | POA: Diagnosis not present

## 2016-07-10 DIAGNOSIS — M546 Pain in thoracic spine: Secondary | ICD-10-CM | POA: Diagnosis not present

## 2016-07-10 DIAGNOSIS — M545 Low back pain, unspecified: Secondary | ICD-10-CM

## 2016-07-10 DIAGNOSIS — M62830 Muscle spasm of back: Secondary | ICD-10-CM | POA: Diagnosis not present

## 2016-07-10 DIAGNOSIS — M6281 Muscle weakness (generalized): Secondary | ICD-10-CM | POA: Diagnosis not present

## 2016-07-10 DIAGNOSIS — M25552 Pain in left hip: Secondary | ICD-10-CM | POA: Diagnosis not present

## 2016-07-10 NOTE — Therapy (Signed)
[address] [place], [place], [address] Phone: [phone]   Fax:  [phone]  Physical Therapy Treatment  Patient Details  Name: Shannon [name] MRN: [id number] Date of Birth: 12/16/1950 Referring Provider: Elyn Aquas  Encounter Date: 07/10/2016      PT End of Session - 07/10/16 1522    Visit Number 8   Date for PT Re-Evaluation 08/16/16   PT Start Time 7209   PT Stop Time 1535   PT Time Calculation (min) 50 min      Past Medical History:  Diagnosis Date  . Anxiety   . Arthritis    "neck and lower back" (03/25/2016)  . Asthma 1990s X 1   "short term inhaler use"   . Chronic lower back pain   . Degenerative disorder of bone   . Depression   . Diastolic dysfunction   . Drug-induced lupus erythematosus    HCTZ induced; "still gettin over it" (03/25/2016)  . GERD (gastroesophageal reflux disease)   . Herniated disc, cervical   . Hyperlipidemia   . Hypertension   . Neuromuscular disorder ([place])    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 HTN  . Orthostatic hypotension   . OSA on CPAP   . Osteopenia   . PAF (paroxysmal atrial fibrillation) ([place])   . Pinched nerve in neck   . T12 compression fracture ([place]) 11/2015  . Vitamin D deficiency   . Whiplash injury 06/07/2010    Past Surgical History:  Procedure Laterality Date  . APPENDECTOMY  1990s  . ATRIAL FIBRILLATION ABLATION N/A 03/25/2016   Procedure: Atrial Fibrillation Ablation;  Surgeon: [name], MD;  Location: [facility];  Service: Cardiovascular;  Laterality: N/A;  . FOREARM FRACTURE SURGERY Left ~ 02/2011   "broke arm; shattered wrist"  . FOREARM HARDWARE REMOVAL Right ~ 07/2011  . FRACTURE SURGERY    . TEE WITHOUT CARDIOVERSION N/A 03/24/2016   Procedure: TRANSESOPHAGEAL ECHOCARDIOGRAM (TEE);  Surgeon: Sanda Klein, MD;  Location: [facility] ENDOSCOPY;  Service: Cardiovascular;  Laterality: N/A;    There were  no vitals filed for this visit.      Subjective Assessment - 07/10/16 1444    Subjective humidity with AFib making me tired today   Currently in Pain? No/denies                         [facility] Adult PT Treatment/Exercise - 07/10/16 0001      Lumbar Exercises: Aerobic   Stationary Bike Ellliptical I 4 R 3 3 min   Elliptical NuStep Level 4 5 minutes  decrease d/t SOB   UBE (Upper Arm Bike) standing 1.5 fwd and 1.5 back L 3     Lumbar Exercises: Machines for Strengthening   Cybex Lumbar Extension black tband 2 sets 10   Other Lumbar Machine Exercise Lats and rows 20# 2 sets 10     Lumbar Exercises: Standing   Wall Slides 5 reps;3 seconds   Other Standing Lumbar Exercises BOSU step up fwd and SW 10 each  5# pulley rotation 15   Other Standing Lumbar Exercises 5# dead lift with upright row 2 sets 10     Lumbar Exercises: Seated   Sit to Stand 5 reps  3 sets 3# UE ex                  PT Short Term Goals - 06/23/16 1426  PT SHORT TERM GOAL #1   Title independent with initial HEP   Status Achieved           PT Long Term Goals - 07/10/16 1520      PT LONG TERM GOAL #1   Title understand proper posture and body mechanics   Status Achieved     PT LONG TERM GOAL #2   Title decrease pain 50%   Status On-going     PT LONG TERM GOAL #4   Title vaccum 2 rooms without pain >6/10   Status Partially Met               Plan - 07/10/16 1523    Clinical Impression Statement pt had trouble with SOB today d/t humidity but overall did well and progressing with increased func. declined modalities as she was not hurting today.   PT Next Visit Plan func strength and endurance to return to func ADLs      Patient will benefit from skilled therapeutic intervention in order to improve the following deficits and impairments:  Decreased activity tolerance, Decreased balance, Decreased mobility, Decreased strength, Postural dysfunction, Improper body  mechanics, Impaired flexibility, Pain, Increased muscle spasms, Decreased range of motion  Visit Diagnosis: Pain in thoracic spine  Acute bilateral low back pain without sciatica  Muscle spasm of back  Muscle weakness (generalized)  Difficulty in walking, not elsewhere classified     Problem List Patient Active Problem List   Diagnosis Date Noted  . Chronic fatigue 05/27/2016  . A-fib ([place]) 03/25/2016  . Typical atrial flutter ([place])   . Chronic diastolic CHF (congestive heart failure) ([place]) 12/08/2015  . Elevated troponin 11/24/2015  . Atrial fibrillation with RVR ([place])   . Thoracic compression fracture ([place]) 11/16/2015  . Orthostatic hypotension 11/15/2015  . Osteopenia 09/12/2015  . Vitamin D deficiency 08/29/2015  . OSA (obstructive sleep apnea) 03/04/2015  . Polymyalgia ([place]) 11/22/2014  . Essential hypertension 08/21/2014  . Anxiety and depression 08/21/2014  . Hip stiffness 08/21/2014    Caelin Rosen,ANGIE PTA 07/10/2016, 3:25 PM  [address] [place], [place], [address] Phone: [phone]   Fax:  [phone]  Name: Jakaylah Schlafer MRN: [id number] Date of Birth: 11-23-1950

## 2016-07-14 ENCOUNTER — Ambulatory Visit: Payer: Medicare Other | Admitting: Physical Therapy

## 2016-07-14 ENCOUNTER — Encounter: Payer: Self-pay | Admitting: Physical Therapy

## 2016-07-14 DIAGNOSIS — M546 Pain in thoracic spine: Secondary | ICD-10-CM | POA: Diagnosis not present

## 2016-07-14 DIAGNOSIS — M6281 Muscle weakness (generalized): Secondary | ICD-10-CM

## 2016-07-14 DIAGNOSIS — M545 Low back pain, unspecified: Secondary | ICD-10-CM

## 2016-07-14 DIAGNOSIS — M62830 Muscle spasm of back: Secondary | ICD-10-CM

## 2016-07-14 DIAGNOSIS — R262 Difficulty in walking, not elsewhere classified: Secondary | ICD-10-CM | POA: Diagnosis not present

## 2016-07-14 DIAGNOSIS — M25552 Pain in left hip: Secondary | ICD-10-CM | POA: Diagnosis not present
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
## 2016-07-17 ENCOUNTER — Encounter: Payer: Self-pay | Admitting: Physical Therapy

## 2016-07-17 ENCOUNTER — Ambulatory Visit: Payer: Medicare Other | Admitting: Physical Therapy

## 2016-07-17 DIAGNOSIS — M62830 Muscle spasm of back: Secondary | ICD-10-CM | POA: Diagnosis not present

## 2016-07-17 DIAGNOSIS — M545 Low back pain, unspecified: Secondary | ICD-10-CM

## 2016-07-17 DIAGNOSIS — M6281 Muscle weakness (generalized): Secondary | ICD-10-CM

## 2016-07-17 DIAGNOSIS — R262 Difficulty in walking, not elsewhere classified: Secondary | ICD-10-CM | POA: Diagnosis not present

## 2016-07-17 DIAGNOSIS — M546 Pain in thoracic spine: Secondary | ICD-10-CM

## 2016-07-17 DIAGNOSIS — M25552 Pain in left hip: Secondary | ICD-10-CM | POA: Diagnosis not present
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
## 2016-07-20 ENCOUNTER — Encounter: Payer: Self-pay | Admitting: Physician Assistant

## 2016-07-21 ENCOUNTER — Encounter: Payer: Self-pay | Admitting: Physical Therapy

## 2016-07-21 ENCOUNTER — Ambulatory Visit: Payer: Medicare Other | Admitting: Physical Therapy

## 2016-07-21 DIAGNOSIS — R262 Difficulty in walking, not elsewhere classified: Secondary | ICD-10-CM | POA: Diagnosis not present

## 2016-07-21 DIAGNOSIS — M62830 Muscle spasm of back: Secondary | ICD-10-CM | POA: Diagnosis not present

## 2016-07-21 DIAGNOSIS — M545 Low back pain, unspecified: Secondary | ICD-10-CM

## 2016-07-21 DIAGNOSIS — M25552 Pain in left hip: Secondary | ICD-10-CM

## 2016-07-21 DIAGNOSIS — M546 Pain in thoracic spine: Secondary | ICD-10-CM | POA: Diagnosis not present

## 2016-07-21 DIAGNOSIS — M6281 Muscle weakness (generalized): Secondary | ICD-10-CM
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
## 2016-07-24 ENCOUNTER — Ambulatory Visit: Payer: Medicare Other | Admitting: Physical Therapy

## 2016-07-24 ENCOUNTER — Encounter: Payer: Self-pay | Admitting: Physical Therapy

## 2016-07-24 DIAGNOSIS — R262 Difficulty in walking, not elsewhere classified: Secondary | ICD-10-CM | POA: Diagnosis not present

## 2016-07-24 DIAGNOSIS — M545 Low back pain, unspecified: Secondary | ICD-10-CM

## 2016-07-24 DIAGNOSIS — M6281 Muscle weakness (generalized): Secondary | ICD-10-CM

## 2016-07-24 DIAGNOSIS — M546 Pain in thoracic spine: Secondary | ICD-10-CM

## 2016-07-24 DIAGNOSIS — M62830 Muscle spasm of back: Secondary | ICD-10-CM | POA: Diagnosis not present

## 2016-07-24 DIAGNOSIS — M25552 Pain in left hip: Secondary | ICD-10-CM | POA: Diagnosis not present
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
## 2016-07-28 ENCOUNTER — Ambulatory Visit: Payer: Medicare Other | Admitting: Physical Therapy

## 2016-07-28 ENCOUNTER — Encounter: Payer: Self-pay | Admitting: Physical Therapy

## 2016-07-28 DIAGNOSIS — M546 Pain in thoracic spine: Secondary | ICD-10-CM | POA: Diagnosis not present

## 2016-07-28 DIAGNOSIS — M62830 Muscle spasm of back: Secondary | ICD-10-CM | POA: Diagnosis not present

## 2016-07-28 DIAGNOSIS — M6281 Muscle weakness (generalized): Secondary | ICD-10-CM

## 2016-07-28 DIAGNOSIS — M545 Low back pain, unspecified: Secondary | ICD-10-CM

## 2016-07-28 DIAGNOSIS — R262 Difficulty in walking, not elsewhere classified: Secondary | ICD-10-CM | POA: Diagnosis not present

## 2016-07-28 DIAGNOSIS — M25552 Pain in left hip: Secondary | ICD-10-CM | POA: Diagnosis not present

## 2016-07-28 NOTE — Therapy (Addendum)
[address] [place], [place], [address] Phone: [phone]   Fax:  [phone]  Physical Therapy Treatment  Patient Details  Name: Shannon [name] MRN: [id number] Date of Birth: 02-27-1950 Referring Provider: Elyn Aquas  Encounter Date: 07/28/2016      PT End of Session - 07/28/16 1511    Visit Number 13   Date for PT Re-Evaluation 08/16/16   PT Start Time 6568   PT Stop Time 1526   PT Time Calculation (min) 58 min   Activity Tolerance Patient tolerated treatment well   Behavior During Therapy [facility] for tasks assessed/performed      Past Medical History:  Diagnosis Date  . Anxiety   . Arthritis    "neck and lower back" (03/25/2016)  . Asthma 1990s X 1   "short term inhaler use"   . Chronic lower back pain   . Degenerative disorder of bone   . Depression   . Diastolic dysfunction   . Drug-induced lupus erythematosus    HCTZ induced; "still gettin over it" (03/25/2016)  . GERD (gastroesophageal reflux disease)   . Herniated disc, cervical   . Hyperlipidemia   . Hypertension   . Neuromuscular disorder ([place])    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 HTN  . Orthostatic hypotension   . OSA on CPAP   . Osteopenia   . PAF (paroxysmal atrial fibrillation) ([place])   . Pinched nerve in neck   . T12 compression fracture ([place]) 11/2015  . Vitamin D deficiency   . Whiplash injury 06/07/2010    Past Surgical History:  Procedure Laterality Date  . APPENDECTOMY  1990s  . ATRIAL FIBRILLATION ABLATION N/A 03/25/2016   Procedure: Atrial Fibrillation Ablation;  Surgeon: [name], MD;  Location: [place] [facility];  Service: Cardiovascular;  Laterality: N/A;  . FOREARM FRACTURE SURGERY Left ~ 02/2011   "broke arm; shattered wrist"  . FOREARM HARDWARE REMOVAL Right ~ 07/2011  . FRACTURE SURGERY    . TEE WITHOUT CARDIOVERSION N/A 03/24/2016   Procedure: TRANSESOPHAGEAL ECHOCARDIOGRAM (TEE);   Surgeon: Sanda Klein, MD;  Location: [facility] ENDOSCOPY;  Service: Cardiovascular;  Laterality: N/A;    There were no vitals filed for this visit.      Subjective Assessment - 07/28/16 1427    Subjective "I have been in pain but I don't know why" Pt reports that she has been in pain since she left.   Currently in Pain? Yes   Pain Score 6    Pain Location Back   Pain Orientation Right;Left;Lower                         [facility] Adult PT Treatment/Exercise - 07/28/16 0001      Lumbar Exercises: Stretches   Passive Hamstring Stretch 4 reps;10 seconds   Double Knee to Chest Stretch 2 reps;10 seconds   Lower Trunk Rotation 2 reps;10 seconds   Piriformis Stretch 3 reps;10 seconds     Lumbar Exercises: Aerobic   Stationary Bike Ellliptical I 10 R 5 4 min   Elliptical NuStep Level 4 5 minutes     Lumbar Exercises: Machines for Strengthening   Cybex Knee Extension 10# 2x15   Cybex Knee Flexion 25# 2x15   Other Lumbar Machine Exercise Lats and rows 25# 2 sets 15     Lumbar Exercises: Supine   Bridge 10 reps;2 seconds  x2     Moist  Heat Therapy   Number Minutes Moist Heat 15 Minutes   Moist Heat Location Lumbar Spine     Electrical Stimulation   Electrical Stimulation Location T/L area   Electrical Stimulation Action IFC   Electrical Stimulation Parameters supine   Electrical Stimulation Goals Pain                  PT Short Term Goals - 06/23/16 1426      PT SHORT TERM GOAL #1   Title independent with initial HEP   Status Achieved           PT Long Term Goals - 07/24/16 1454      PT LONG TERM GOAL #1   Title understand proper posture and body mechanics   Status Achieved     PT LONG TERM GOAL #2   Title decrease pain 50%   Status Partially Met     PT LONG TERM GOAL #3   Title increase lumbar ROM 50%   Status Achieved     PT LONG TERM GOAL #4   Title vaccum 2 rooms without pain >6/10   Status Not Met               Plan -  07/28/16 1512    Clinical Impression Statement Pt with reports of constant LBP that has hindered her ability to cook dinner at night. Reports som initial pain with hip bridges but it eased off after a few reps. Positive response to lumbar stretching. Good strength with all machine interventions.   Rehab Potential Good   PT Frequency 2x / week   PT Duration 8 weeks   PT Treatment/Interventions ADLs/Self Care Home Management;Cryotherapy;Electrical Stimulation;Moist Heat;Therapeutic activities;Therapeutic exercise;Neuromuscular re-education;Patient/family education;Manual techniques   PT Next Visit Plan func strength and endurance to return to func ADLs; Functional endurance.      Patient will benefit from skilled therapeutic intervention in order to improve the following deficits and impairments:  Decreased activity tolerance, Decreased balance, Decreased mobility, Decreased strength, Postural dysfunction, Improper body mechanics, Impaired flexibility, Pain, Increased muscle spasms, Decreased range of motion  Visit Diagnosis: Pain in thoracic spine  Acute bilateral low back pain without sciatica  Muscle spasm of back  Muscle weakness (generalized)  Difficulty in walking, not elsewhere classified     Problem List Patient Active Problem List   Diagnosis Date Noted  . Chronic fatigue 05/27/2016  . A-fib ([place]) 03/25/2016  . Typical atrial flutter ([place])   . Chronic diastolic CHF (congestive heart failure) ([place]) 12/08/2015  . Elevated troponin 11/24/2015  . Atrial fibrillation with RVR ([place])   . Thoracic compression fracture ([place]) 11/16/2015  . Orthostatic hypotension 11/15/2015  . Osteopenia 09/12/2015  . Vitamin D deficiency 08/29/2015  . OSA (obstructive sleep apnea) 03/04/2015  . Polymyalgia ([place]) 11/22/2014  . Essential hypertension 08/21/2014  . Anxiety and depression 08/21/2014  . Hip stiffness 08/21/2014    Scot Jun, PTA 07/28/2016, 3:15 PM  [address] [place], [place], [address] Phone: [phone]   Fax:  [phone]  Name: Shannon [name] MRN: [id number] Date of Birth: [birth year] PHYSICAL THERAPY DISCHARGE SUMMARY   Plan: Patient agrees to discharge.  Patient goals were not met. Patient is being discharged due to not returning since the last visit.  ?????

## 2016-07-30 ENCOUNTER — Other Ambulatory Visit: Payer: Self-pay | Admitting: Physician Assistant

## 2016-07-30 ENCOUNTER — Telehealth: Payer: Self-pay

## 2016-07-30 ENCOUNTER — Encounter: Payer: Self-pay | Admitting: Physician Assistant

## 2016-07-30 ENCOUNTER — Ambulatory Visit (INDEPENDENT_AMBULATORY_CARE_PROVIDER_SITE_OTHER): Payer: Medicare Other

## 2016-07-30 ENCOUNTER — Ambulatory Visit (INDEPENDENT_AMBULATORY_CARE_PROVIDER_SITE_OTHER): Payer: Medicare Other | Admitting: Physician Assistant

## 2016-07-30 VITALS — BP 110/64 | HR 72 | Temp 99.0°F | Resp 14 | Ht 67.0 in | Wt 217.0 lb

## 2016-07-30 DIAGNOSIS — M47814 Spondylosis without myelopathy or radiculopathy, thoracic region: Secondary | ICD-10-CM | POA: Diagnosis not present

## 2016-07-30 DIAGNOSIS — M47816 Spondylosis without myelopathy or radiculopathy, lumbar region: Secondary | ICD-10-CM | POA: Diagnosis not present

## 2016-07-30 DIAGNOSIS — G8929 Other chronic pain: Secondary | ICD-10-CM

## 2016-07-30 DIAGNOSIS — M545 Low back pain: Secondary | ICD-10-CM

## 2016-07-30 DIAGNOSIS — M4854XS Collapsed vertebra, not elsewhere classified, thoracic region, sequela of fracture: Secondary | ICD-10-CM

## 2016-07-30 DIAGNOSIS — S22080A Wedge compression fracture of T11-T12 vertebra, initial encounter for closed fracture: Secondary | ICD-10-CM

## 2016-07-30 IMAGING — DX DG THORACIC SPINE 3V
3 series · 3 of 3 positions shown · non-contrast
Comparison: Chest x-ray of [DATE] and thoracic CT scan [DATE]

CLINICAL DATA: Mid to lower back pain since a fall in [TZ]
resulting in T12 compression. No recent injury.

EXAM:
THORACIC SPINE - 3 VIEWS

[thoracic spine ap]
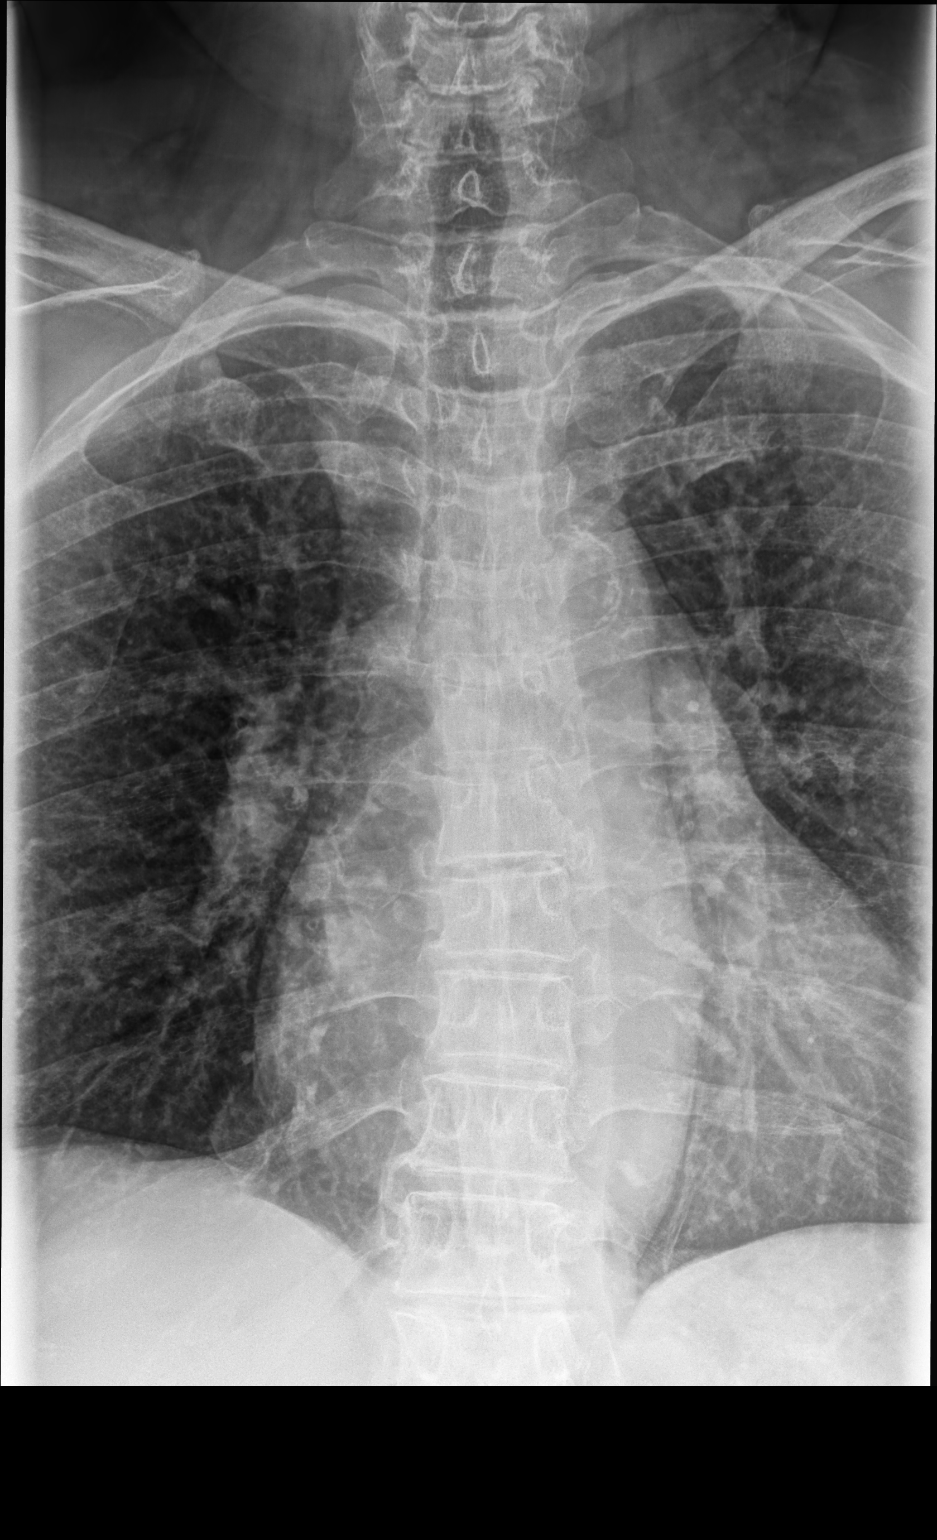

[thoracic spine lat (1 of 2)]
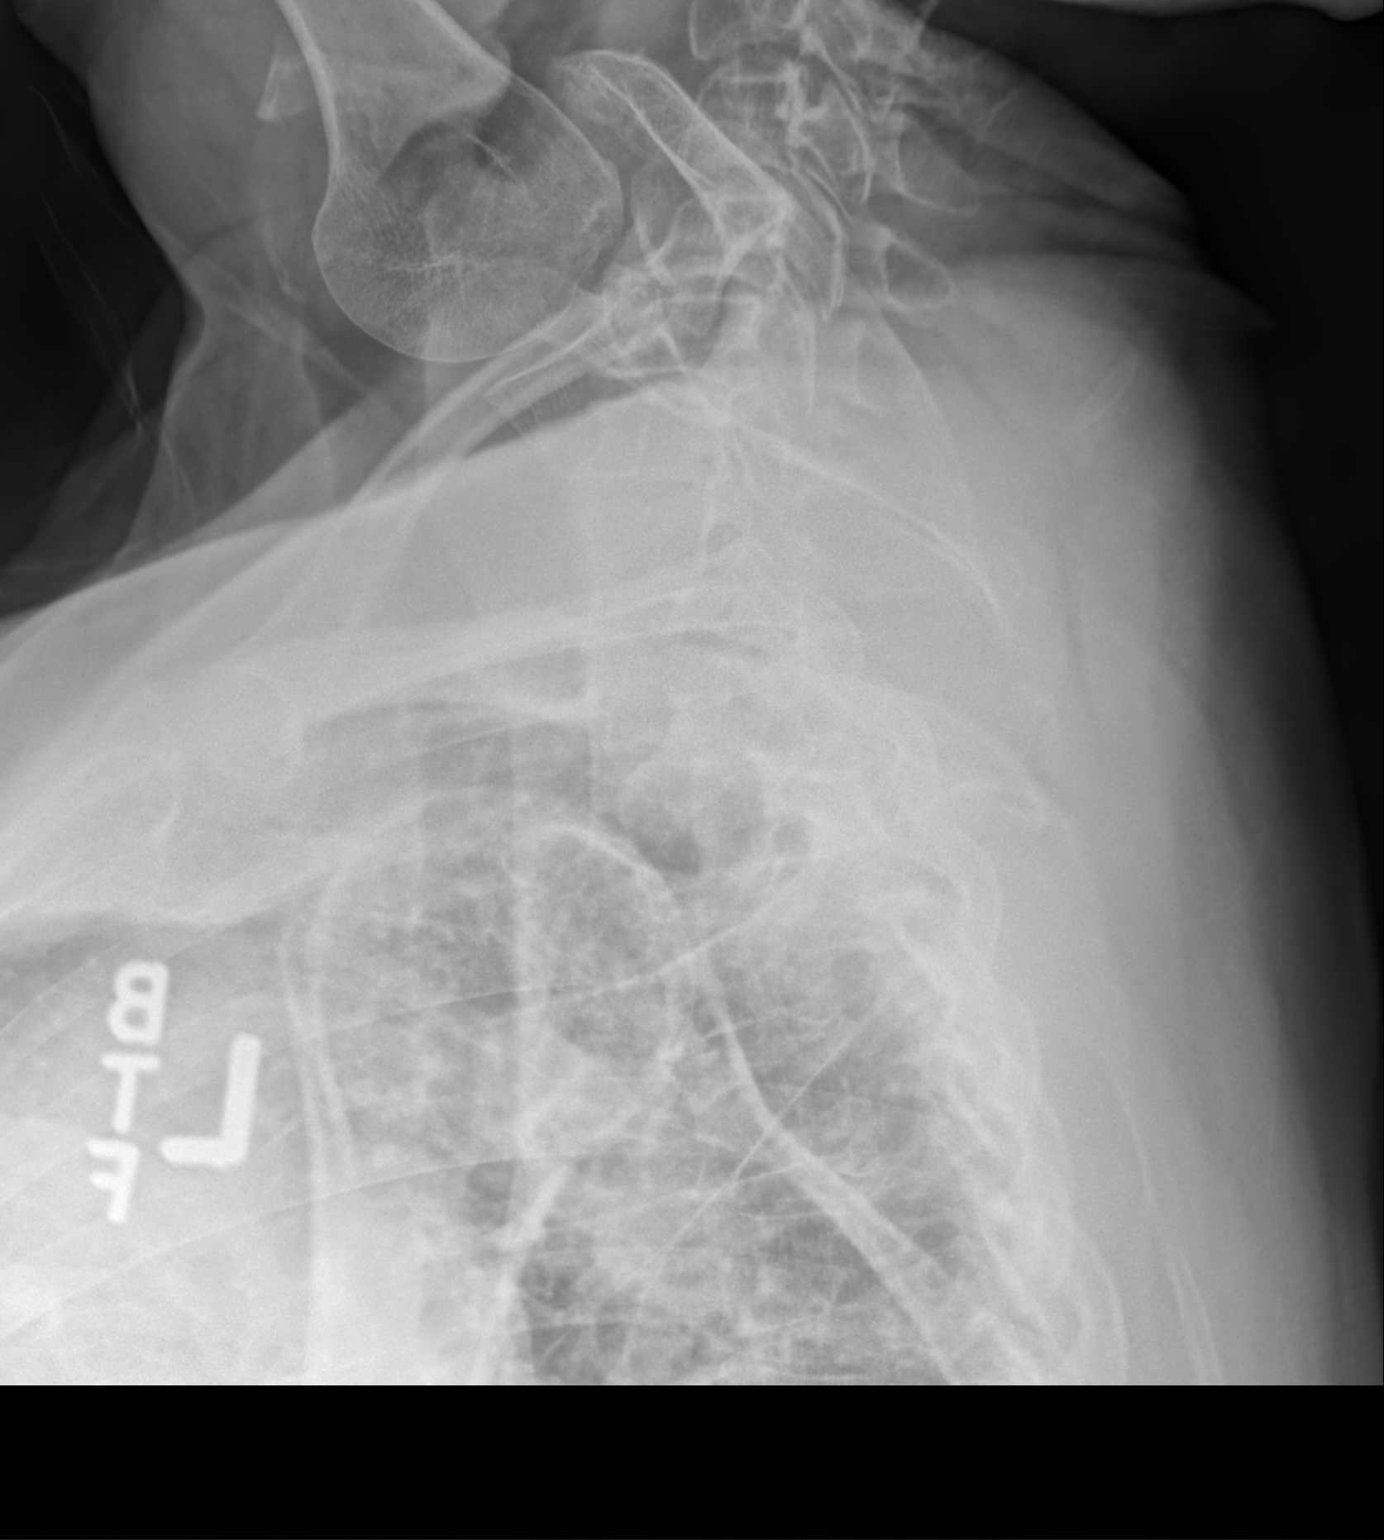

[thoracic spine lat (2 of 2)]
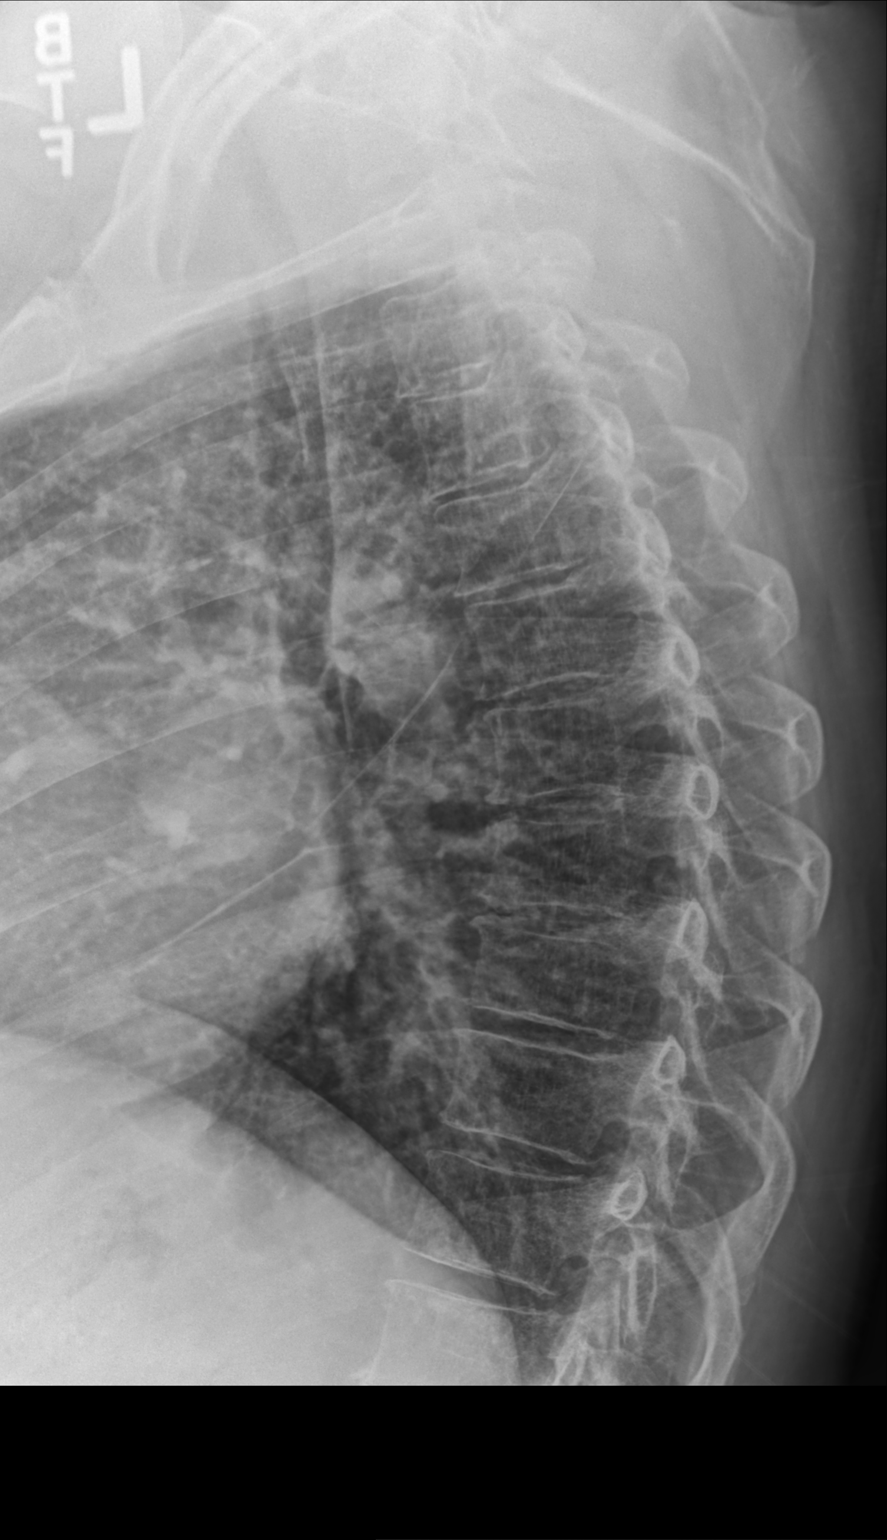

[3 of 3 positions shown; findings below may reference images not displayed]

FINDINGS: The thoracic vertebral bodies are preserved in height with exception
of T12 where anterior wedge compression amounting to approximately
30% is present and stable since the above chest x-ray. It is more
conspicuous than on the [REDACTED] thoracic spine CT scan. The
disc space heights are reasonably well-maintained. The pedicles are
intact.
IMPRESSION: Partial compression of the body of T12 which is stable since
[DATE]. No new compression fracture or other acute bony
abnormality.

## 2016-07-30 IMAGING — DX DG LUMBAR SPINE COMPLETE 4+V
5 series · 5 of 5 positions shown · non-contrast
Comparison: Lumbar spine series dated [DATE]

CLINICAL DATA: Status post fall in [DATE] with persistent
mid to lower back pain. Known T12 compression fracture.

EXAM:
LUMBAR SPINE - COMPLETE 4+ VIEW

[lumbar spine ap]
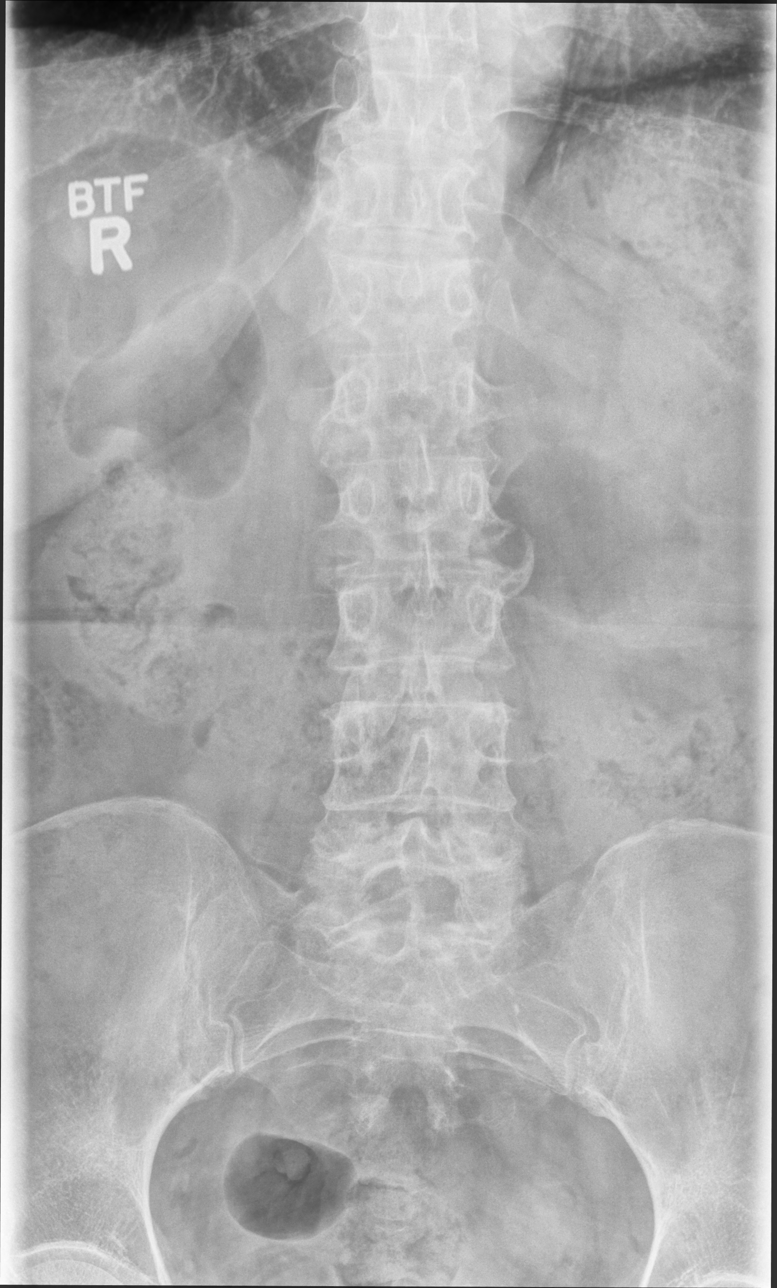

[lumbar spine oblique (1 of 2)]
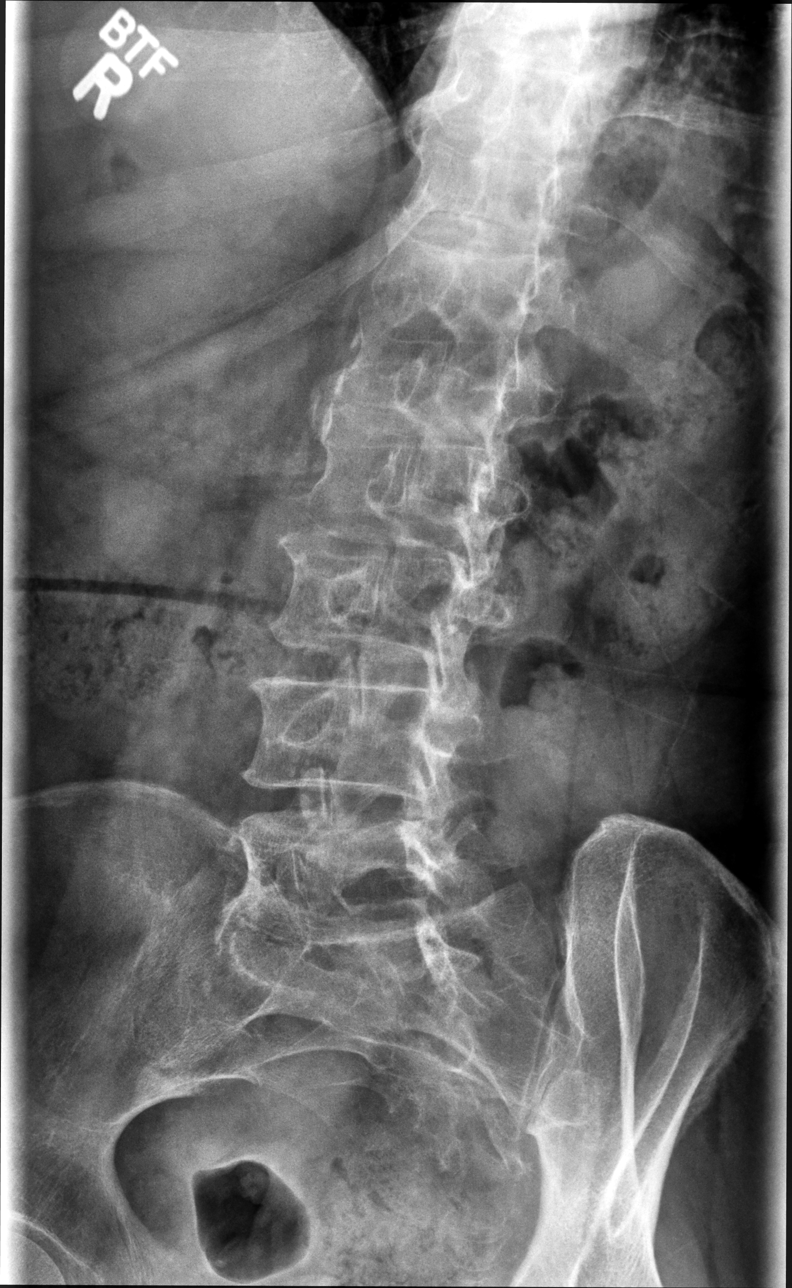

[lumbar spine oblique (2 of 2)]
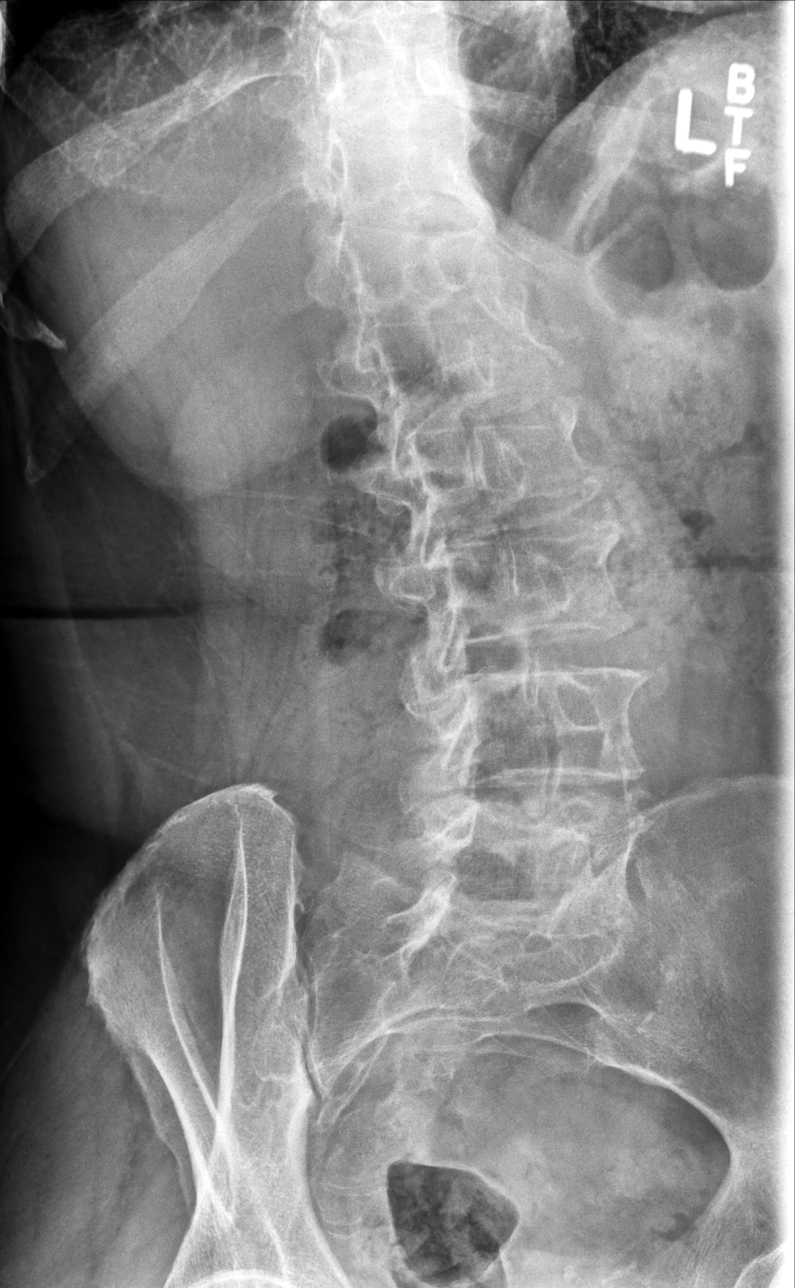

[lumbar spine lat (1 of 2)]
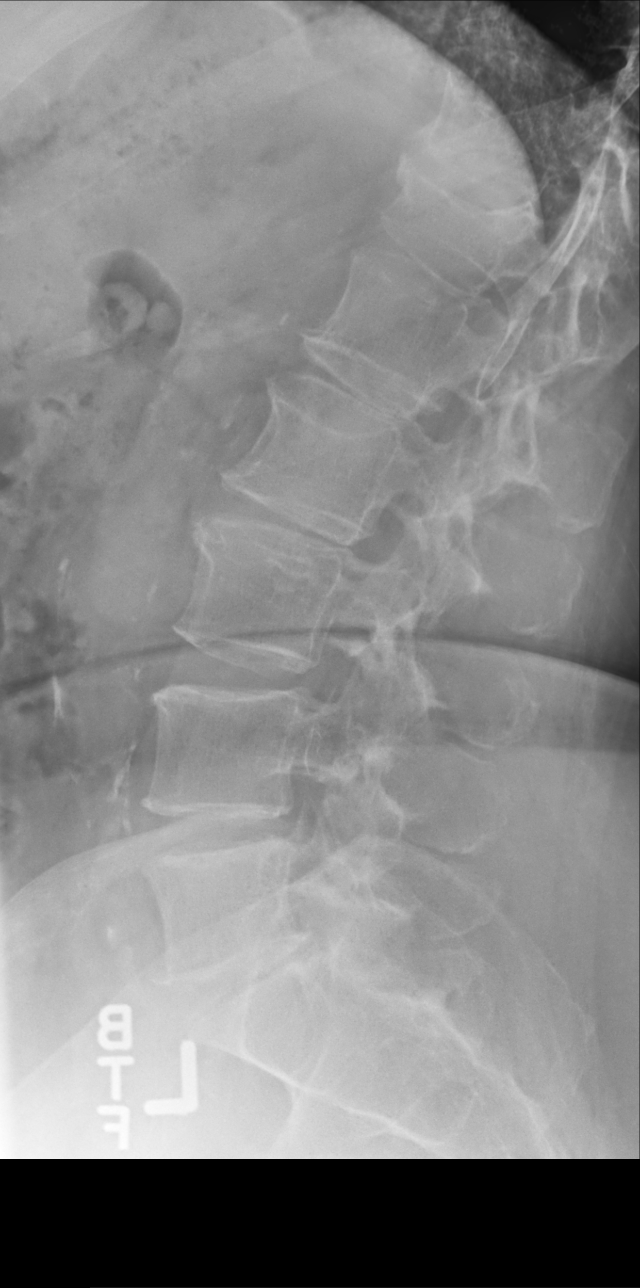

[lumbar spine lat (2 of 2)]
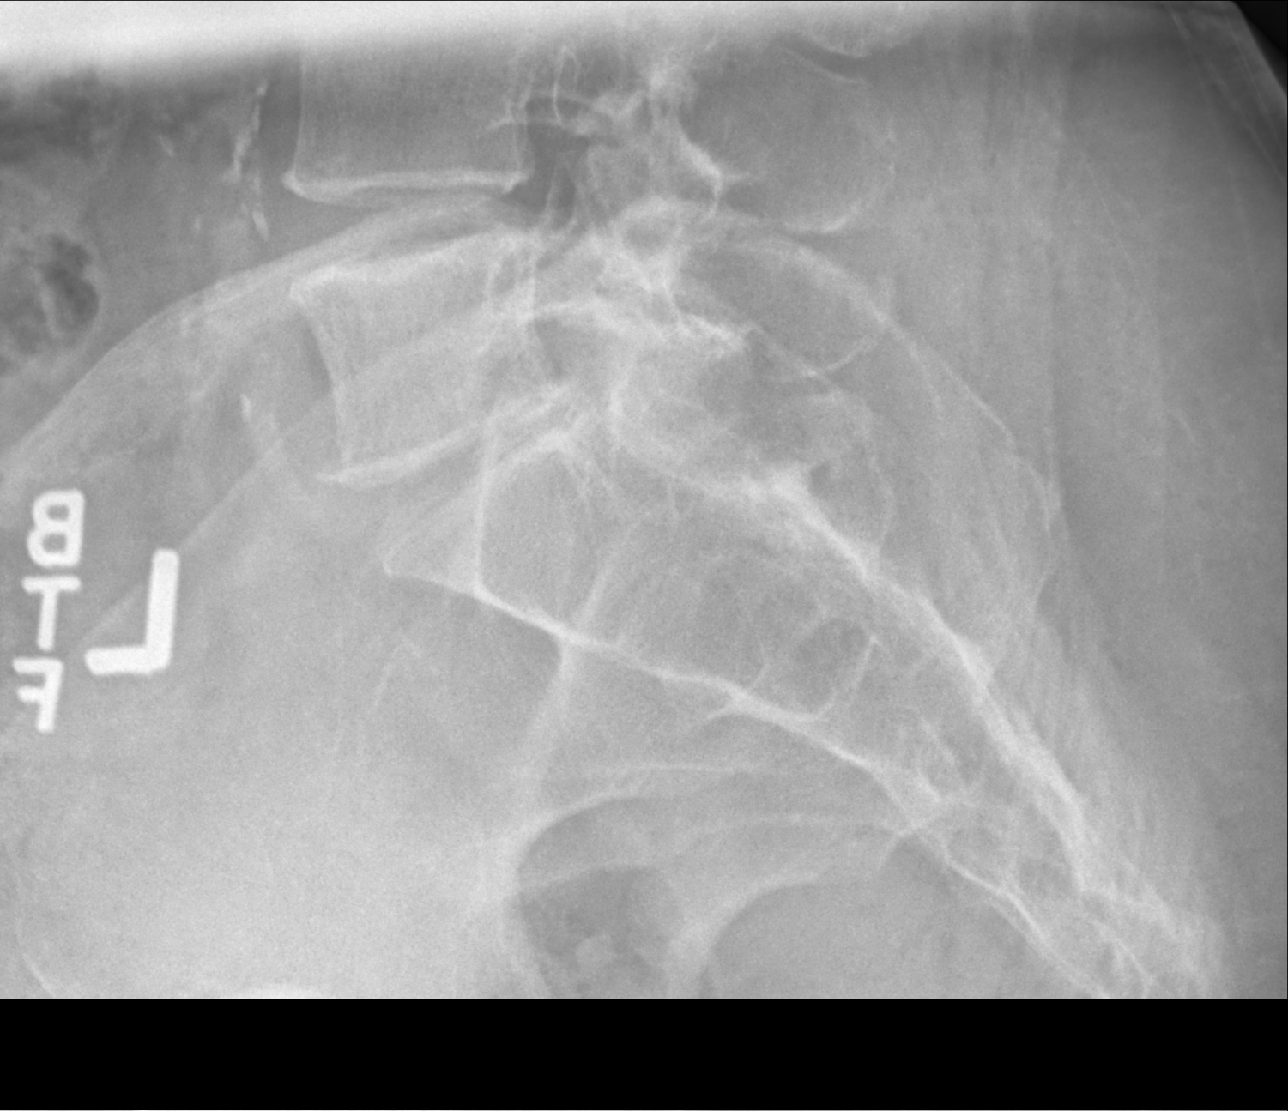

[5 of 5 positions shown; findings below may reference images not displayed]

FINDINGS: The lumbar vertebral bodies are preserved in height. The disc space
heights are well maintained. There is no spondylolisthesis. There is
facet joint hypertrophy at L4-5 and L5-S1. There is calcification in
the wall of the abdominal aorta. A T12 compression fracture is
visible and is seen to be more conspicuous than on the [REDACTED]
study.
IMPRESSION: No acute or significant chronic bony abnormality of the lumbar
spine.

T12 compression fracture which is been previously demonstrated on
the thoracic spine series of today's date. It is not new but is more
prominent than on the previous [DATE] lumbar spine series.

Abdominal aortic atherosclerosis.

## 2016-07-30 NOTE — Patient Instructions (Signed)
Please speak with Levada Dy regarding your x-ray. She will give you instructions to the [place] office for imaging. I will call with results. I would recommend holding off on physical therapy for the next week while we are looking into things further.  We will alter regimen based on findings. We may need Orthopedic involvement.

## 2016-07-30 NOTE — Progress Notes (Signed)
Patient presents to clinic today c/o recurrence of thoracic back pain over the past several week. Patient with history of T12 compression fracture last year. Is still in physical therapy to help with strengthening. Has done very well with PT, noting improved upper body and core strength and increased flexibility without pain. Noted since a few weeks ago she has had recurrence of pain in the area of previous compression fracture. Pain is intermittent but is more frequent and severe. Patient notes pain is sharp and non-radiating. Denies trauma, injury or fall. Is taking Tylenol Arthritis with relief of pain.   Past Medical History:  Diagnosis Date  . Anxiety   . Arthritis    "neck and lower back" (03/25/2016)  . Asthma 1990s X 1   "short term inhaler use"   . Chronic lower back pain   . Degenerative disorder of bone   . Depression   . Diastolic dysfunction   . Drug-induced lupus erythematosus    HCTZ induced; "still gettin over it" (03/25/2016)  . GERD (gastroesophageal reflux disease)   . Herniated disc, cervical   . Hyperlipidemia   . Hypertension   . Neuromuscular disorder ([place])    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 HTN  . Orthostatic hypotension   . OSA on CPAP   . Osteopenia   . PAF (paroxysmal atrial fibrillation) ([place])   . Pinched nerve in neck   . T12 compression fracture ([place]) 11/2015  . Vitamin D deficiency   . Whiplash injury 06/07/2010    Current Outpatient Prescriptions on File Prior to Visit  Medication Sig Dispense Refill  . acetaminophen (TYLENOL) 650 MG CR tablet Take 650-1,300 mg by mouth every 8 (eight) hours as needed for pain.     Marland Kitchen ALPRAZolam (XANAX) 0.5 MG tablet TAKE ONE TABLET BY MOUTH THREE TIMES DAILY AS NEEDED FOR ANXIETY 30 tablet 0  . amLODipine (NORVASC) 5 MG tablet Take 1 tablet (5 mg total) by mouth daily.    . carvedilol (COREG) 12.5 MG tablet Take 1 tablet (12.5 mg total) by mouth 2 (two) times daily. 180 tablet 1  .  cholecalciferol (VITAMIN D) 1000 units tablet Take 1,000 Units by mouth daily.    . cyanocobalamin 500 MCG tablet Take 1 tablet (500 mcg total) by mouth daily. 90 tablet 2  . DULoxetine (CYMBALTA) 60 MG capsule Take 1 capsule (60 mg total) by mouth daily. 30 capsule 1  . Ferrous Sulfate (IRON) 325 (65 Fe) MG TABS Take 1 tablet by mouth daily. 90 each 2  . Multiple Vitamins-Minerals (MULTIVITAMIN) tablet Take 1 tablet by mouth daily. 30 tablet 0  . rivaroxaban (XARELTO) 20 MG TABS tablet Take 20 mg by mouth daily with supper.     No current facility-administered medications on file prior to visit.     Allergies  Allergen Reactions  . Ace Inhibitors Cough  . Hctz [Hydrochlorothiazide] Other (See Comments)    Caused drug-induced LUPUS  . Oxycodone Other (See Comments)    Hallucinations  . Prednisone Other (See Comments)    Made patient very aggressive  . Sulfa Antibiotics Nausea And Vomiting  . Sulfur Nausea And Vomiting  . Voltaren [Diclofenac Sodium] Other (See Comments)    Made patient become aggressive    Family History  Problem Relation Age of Onset  . Cancer Mother   . Stroke Father   . Hypertension Father   . Hypertension Maternal Grandmother   . Diabetes Neg Hx  Social History   Social History  . Marital status: Married    Spouse name: N/A  . Number of children: N/A  . Years of education: N/A   Social History Main Topics  . Smoking status: Former Smoker    Packs/day: 0.50    Years: 44.00    Types: Cigarettes, E-cigarettes    Quit date: 09/06/2013  . Smokeless tobacco: Never Used  . Alcohol use Yes     Comment: 03/25/2016 "nothing for a couple years now; was having a drink on anniversary and Christmas"  . Drug use: No  . Sexual activity: Not Currently   Other Topics Concern  . None   Social History Narrative   ** Merged History Encounter **        Review of Systems - See HPI.  All other ROS are negative.  There were no vitals taken for this  visit.  Physical Exam  Constitutional: She is oriented to person, place, and time and well-developed, well-nourished, and in no distress.  HENT:  Head: Normocephalic and atraumatic.  Neck: Neck supple.  Cardiovascular: Normal rate, regular rhythm, normal heart sounds and intact distal pulses.   Pulmonary/Chest: Effort normal. No respiratory distress. She has no wheezes. She has no rales. She exhibits no tenderness.  Musculoskeletal:       Cervical back: Normal.       Thoracic back: She exhibits bony tenderness and pain. She exhibits normal range of motion and no tenderness.       Lumbar back: She exhibits tenderness and pain. She exhibits normal range of motion, no bony tenderness and no spasm.  Neurological: She is alert and oriented to person, place, and time.  Skin: Skin is warm and dry. No rash noted.  Psychiatric: Affect normal.  Vitals reviewed.   Recent Results (from the past 2160 hour(s))  CBC     Status: Abnormal   Collection Time: 05/20/16 12:20 PM  Result Value Ref Range   WBC 7.4 4.0 - 10.5 K/uL   RBC 4.00 3.87 - 5.11 Mil/uL   Platelets 349.0 150.0 - 400.0 K/uL   Hemoglobin 11.6 (L) 12.0 - 15.0 g/dL   HCT 35.2 (L) 36.0 - 46.0 %   MCV 87.9 78.0 - 100.0 fl   MCHC 32.8 30.0 - 36.0 g/dL   RDW 14.8 11.5 - 15.5 %  Comp Met (CMET)     Status: Abnormal   Collection Time: 05/20/16 12:20 PM  Result Value Ref Range   Sodium 138 135 - 145 mEq/L   Potassium 4.1 3.5 - 5.1 mEq/L   Chloride 101 96 - 112 mEq/L   CO2 31 19 - 32 mEq/L   Glucose, Bld 107 (H) 70 - 99 mg/dL   BUN 11 6 - 23 mg/dL   Creatinine, Ser 0.74 0.40 - 1.20 mg/dL   Total Bilirubin 0.4 0.2 - 1.2 mg/dL   Alkaline Phosphatase 127 (H) 39 - 117 U/L   AST 18 0 - 37 U/L   ALT 19 0 - 35 U/L   Total Protein 7.1 6.0 - 8.3 g/dL   Albumin 4.4 3.5 - 5.2 g/dL   Calcium 9.7 8.4 - 10.5 mg/dL   GFR 83.73 >60.00 mL/min  TSH     Status: None   Collection Time: 05/20/16 12:20 PM  Result Value Ref Range   TSH 1.21 0.35 -  4.50 uIU/mL  T4, free     Status: None   Collection Time: 05/20/16 12:20 PM  Result Value Ref Range  Free T4 0.85 0.60 - 1.60 ng/dL    Comment: Specimens from patients who are undergoing biotin therapy and /or ingesting biotin supplements may contain high levels of biotin.  The higher biotin concentration in these specimens interferes with this Free T4 assay.  Specimens that contain high levels  of biotin may cause false high results for this Free T4 assay.  Please interpret results in light of the total clinical presentation of the patient.    B12     Status: None   Collection Time: 05/20/16 12:20 PM  Result Value Ref Range   Vitamin B-12 308 211 - 911 pg/mL  Vitamin D 1,25 dihydroxy     Status: None   Collection Time: 05/20/16 12:20 PM  Result Value Ref Range   Vitamin D 1, 25 (OH)2 Total 47 18 - 72 pg/mL   Vitamin D3 1, 25 (OH)2 47 pg/mL   Vitamin D2 1, 25 (OH)2 <8 pg/mL    Comment: Vitamin D3, 1,25(OH)2 indicates both endogenous production and supplementation.  Vitamin D2, 1,25(OH)2 is an indicator of exogeous sources, such as diet or supplementation.  Interpretation and therapy are based on measurement of Vitamin D,1,25(OH)2, Total. This test was developed and its analytical performance characteristics have been determined by [facility], [place], [place]. It has not been cleared or approved by the FDA. This assay has been validated pursuant to the CLIA regulations and is used for clinical purposes.   Vitamin B12     Status: None   Collection Time: 06/17/16 10:08 AM  Result Value Ref Range   Vitamin B-12 375 211 - 911 pg/mL  CBC with Differential/Platelet     Status: Abnormal   Collection Time: 06/17/16 10:08 AM  Result Value Ref Range   WBC 6.5 4.0 - 10.5 K/uL   RBC 3.89 3.87 - 5.11 Mil/uL   Hemoglobin 11.5 (L) 12.0 - 15.0 g/dL   HCT 34.3 (L) 36.0 - 46.0 %   MCV 88.3 78.0 - 100.0 fl   MCHC 33.4 30.0 - 36.0 g/dL   RDW 14.1 11.5 - 15.5 %    Platelets 327.0 150.0 - 400.0 K/uL   Neutrophils Relative % 67.0 43.0 - 77.0 %   Lymphocytes Relative 22.2 12.0 - 46.0 %   Monocytes Relative 5.9 3.0 - 12.0 %   Eosinophils Relative 4.1 0.0 - 5.0 %   Basophils Relative 0.8 0.0 - 3.0 %   Neutro Abs 4.4 1.4 - 7.7 K/uL   Lymphs Abs 1.4 0.7 - 4.0 K/uL   Monocytes Absolute 0.4 0.1 - 1.0 K/uL   Eosinophils Absolute 0.3 0.0 - 0.7 K/uL   Basophils Absolute 0.1 0.0 - 0.1 K/uL  Hepatic function panel     Status: Abnormal   Collection Time: 06/17/16 10:08 AM  Result Value Ref Range   Total Bilirubin 0.3 0.2 - 1.2 mg/dL   Bilirubin, Direct 0.1 0.0 - 0.3 mg/dL   Alkaline Phosphatase 125 (H) 39 - 117 U/L   AST 17 0 - 37 U/L   ALT 20 0 - 35 U/L   Total Protein 6.8 6.0 - 8.3 g/dL   Albumin 4.0 3.5 - 5.2 g/dL  Iron     Status: None   Collection Time: 06/17/16 10:08 AM  Result Value Ref Range   Iron 55 42 - 145 ug/dL  Gamma GT     Status: None   Collection Time: 07/08/16  1:36 PM  Result Value Ref Range   GGT 18 7 - 51 U/L  CBC with Differential/Platelet     Status: Abnormal   Collection Time: 07/08/16  1:36 PM  Result Value Ref Range   WBC 6.5 4.0 - 10.5 K/uL   RBC 3.96 3.87 - 5.11 Mil/uL   Hemoglobin 11.6 (L) 12.0 - 15.0 g/dL   HCT 34.8 (L) 36.0 - 46.0 %   MCV 88.0 78.0 - 100.0 fl   MCHC 33.3 30.0 - 36.0 g/dL   RDW 14.0 11.5 - 15.5 %   Platelets 342.0 150.0 - 400.0 K/uL   Neutrophils Relative % 60.4 43.0 - 77.0 %   Lymphocytes Relative 27.9 12.0 - 46.0 %   Monocytes Relative 6.9 3.0 - 12.0 %   Eosinophils Relative 3.7 0.0 - 5.0 %   Basophils Relative 1.1 0.0 - 3.0 %   Neutro Abs 4.0 1.4 - 7.7 K/uL   Lymphs Abs 1.8 0.7 - 4.0 K/uL   Monocytes Absolute 0.5 0.1 - 1.0 K/uL   Eosinophils Absolute 0.2 0.0 - 0.7 K/uL   Basophils Absolute 0.1 0.0 - 0.1 K/uL    Assessment/Plan: 1. T12 compression fracture ([place]) X-ray today to reassess. Hold PT until further assessment has been obtained. Tylenol Arthritis as directed.  - DG Thoracic Spine  W/Swimmers; Future  2. Chronic bilateral low back pain without sciatica X-ray today to reassess. Hold PT until further assessment has been obtained. Tylenol Arthritis as directed.  - DG Lumbar Spine Complete; Future   Leeanne Rio, PA-C

## 2016-07-30 NOTE — Progress Notes (Signed)
Pre visit review using our clinic review tool, if applicable. No additional management support is needed unless otherwise documented below in the visit note. 

## 2016-07-31 ENCOUNTER — Ambulatory Visit: Payer: Medicare Other | Admitting: Physical Therapy

## 2016-08-01 ENCOUNTER — Other Ambulatory Visit (HOSPITAL_COMMUNITY): Payer: Self-pay | Admitting: *Deleted

## 2016-08-01 MED ORDER — CARVEDILOL 12.5 MG PO TABS: 12.5000 mg | ORAL_TABLET | Freq: Two times a day (BID) | ORAL | 1 refills | Status: DC

## 2016-08-07 ENCOUNTER — Encounter: Payer: Self-pay | Admitting: Physician Assistant

## 2016-08-08 NOTE — Telephone Encounter (Signed)
Levada Dy -- can we please check an update on neurosurgery referral for Mrs Cressy? Thank you.

## 2016-08-09 IMAGING — US US RENAL
1 series · 14 of 23 positions shown · non-contrast
Comparison: MRI of the lumbar spine [DATE]

CLINICAL DATA: Followup incidental RIGHT hydronephrosis.

EXAM:
RENAL / URINARY TRACT ULTRASOUND COMPLETE

[Series 1: us renal · 0.21mm/px · 14 of 23 slices shown]
[im 1/23]
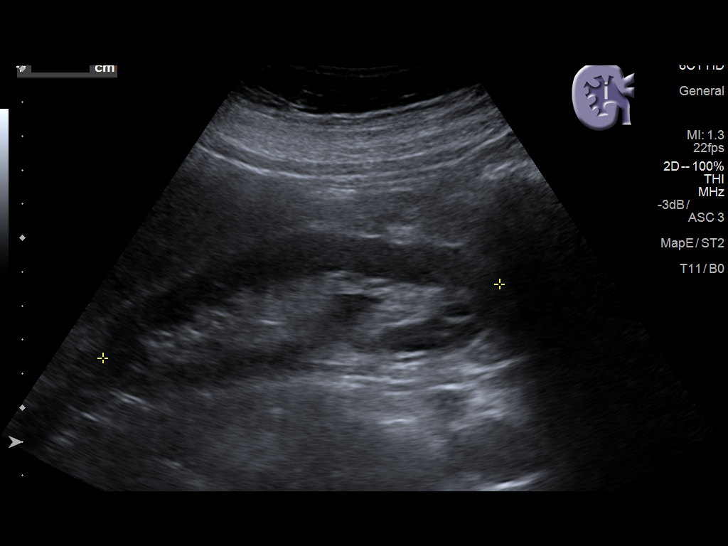
[im 3/23]
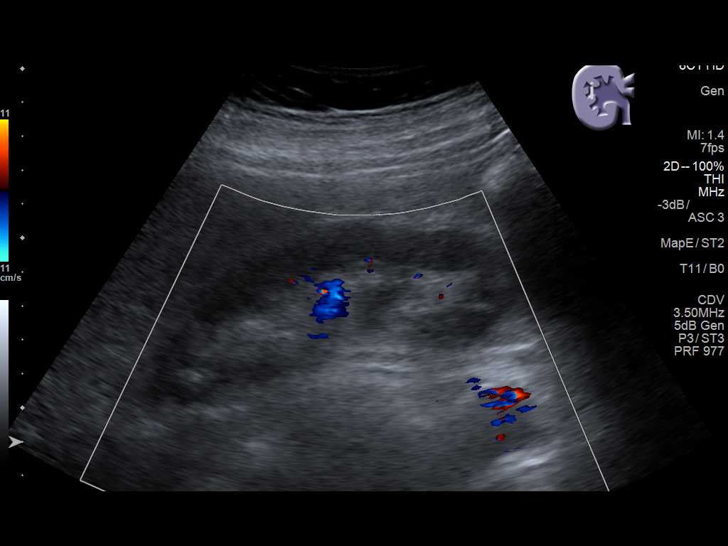
[im 5/23]
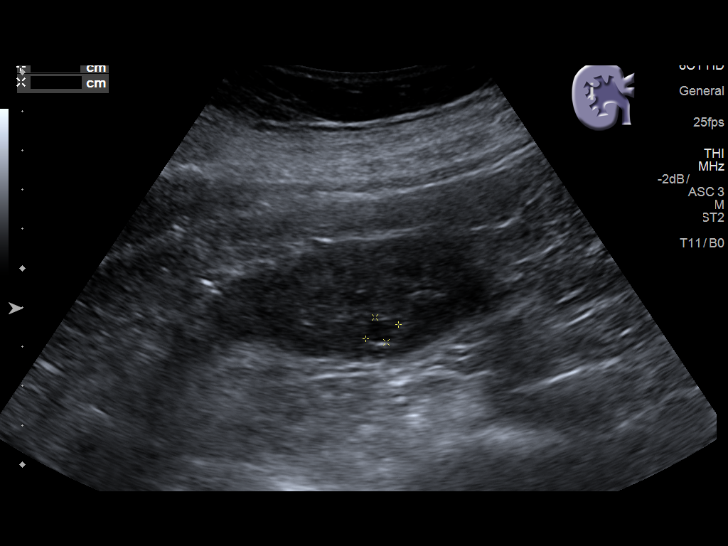
[im 6/23]
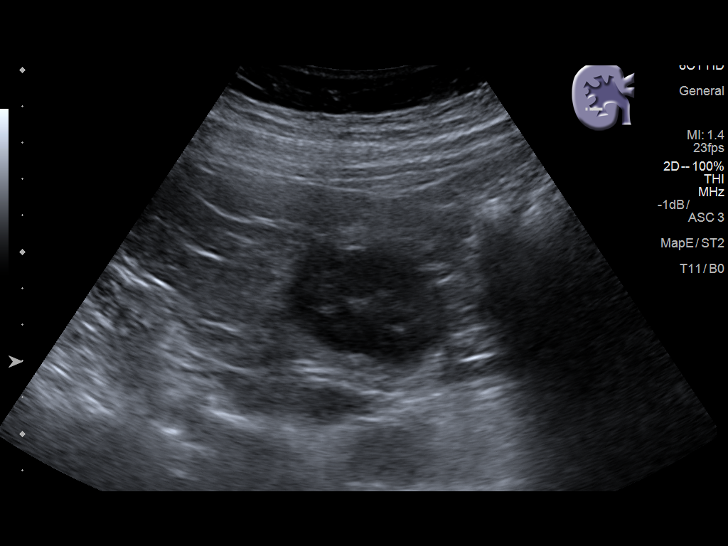
[im 8/23]
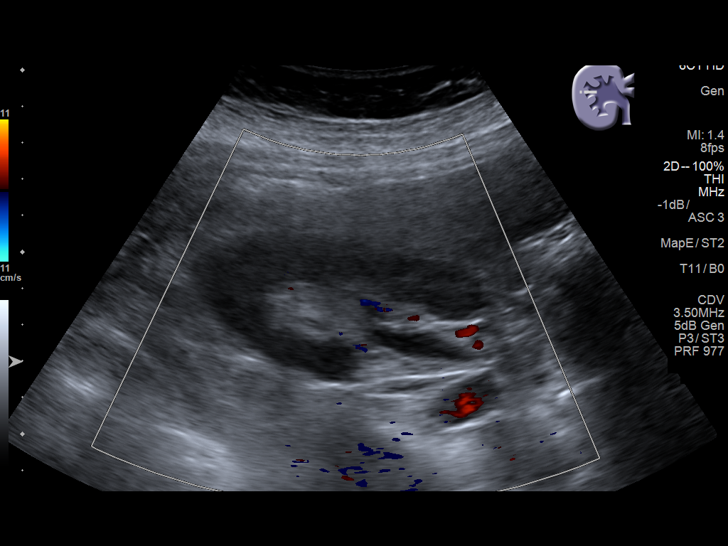
[im 10/23]
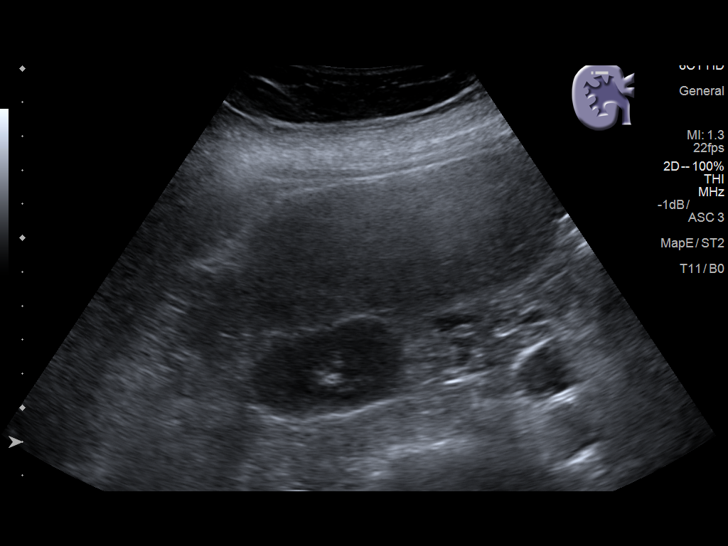
[im 11/23]
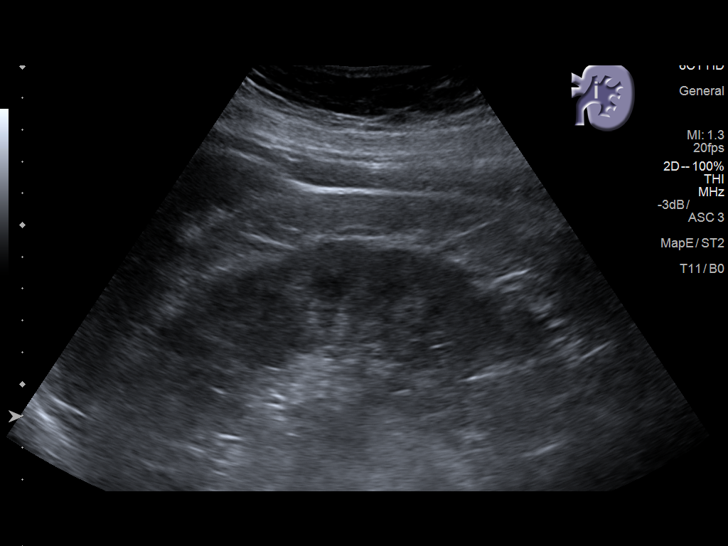
[im 13/23]
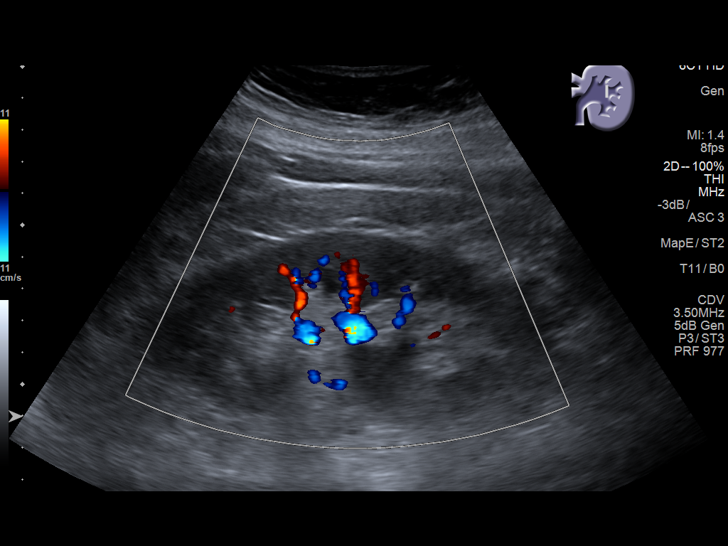
[im 14/23]
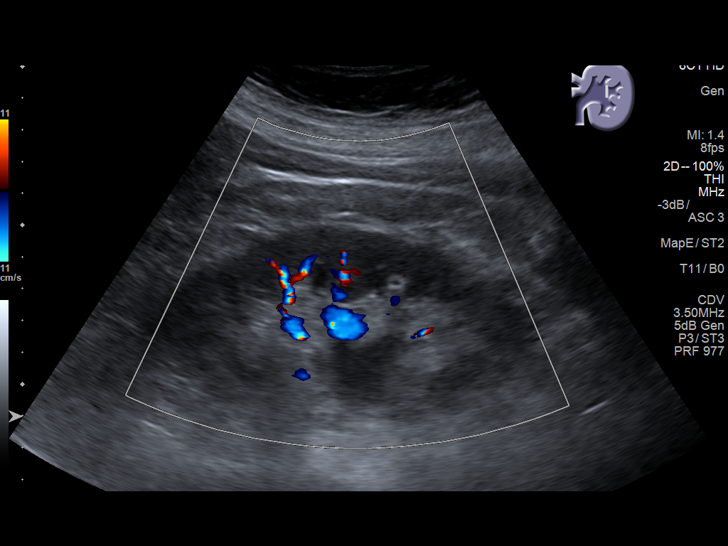
[im 16/23]
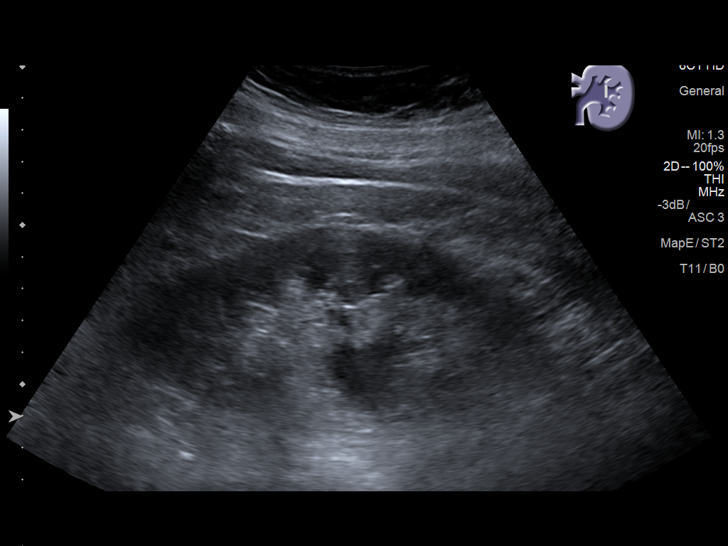
[im 18/23]
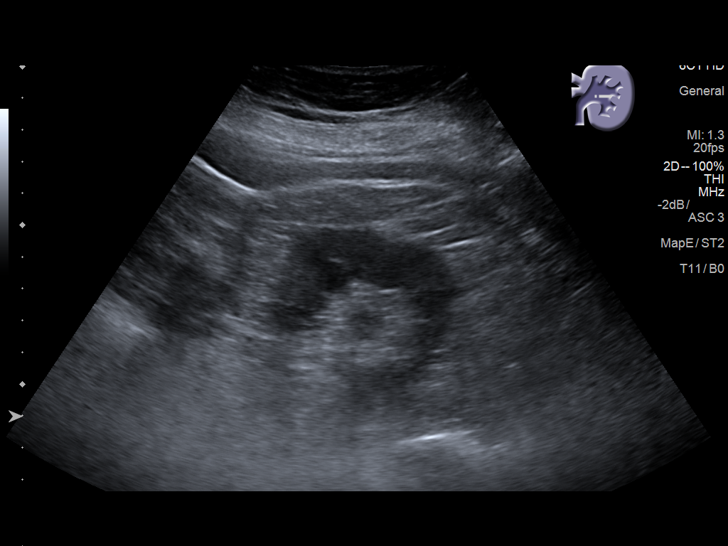
[im 19/23]
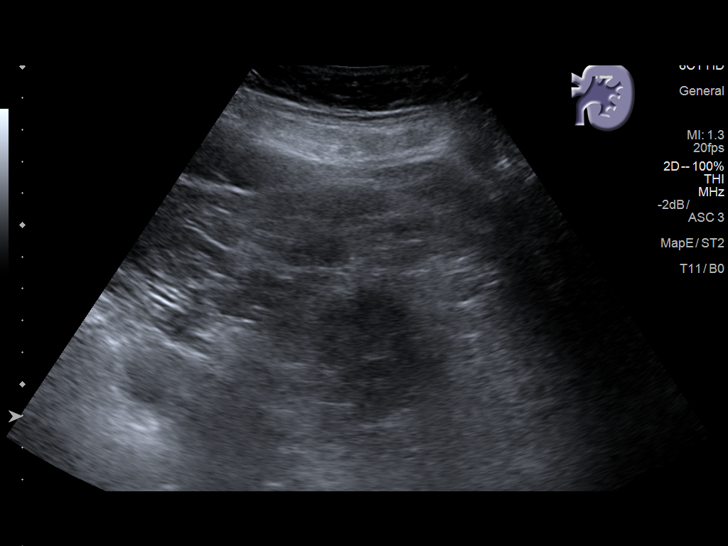
[im 21/23]
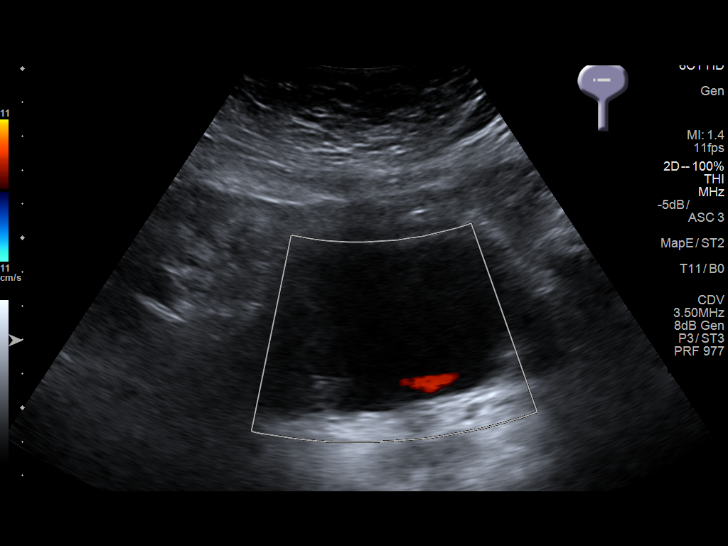
[im 23/23]
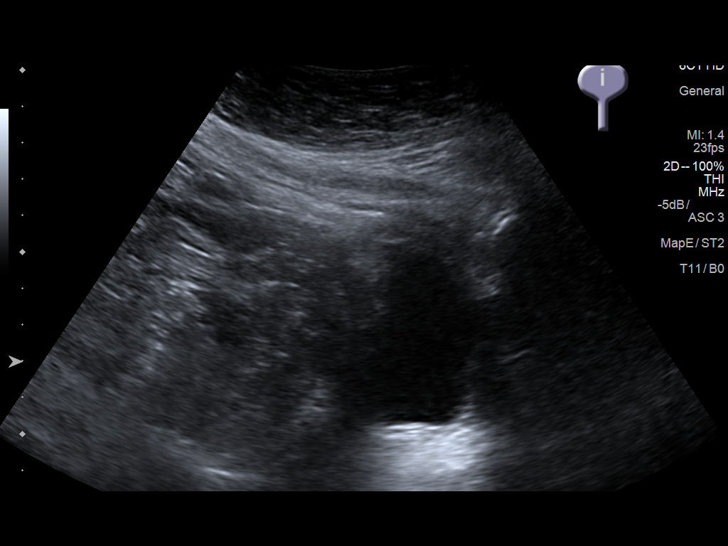

[14 of 23 positions shown; findings below may reference images not displayed]

FINDINGS: Right Kidney:

Length: 11.9 cm. Echogenicity within normal limits. Mild RIGHT
hydronephrosis. 9 mm anechoic cyst.

Left Kidney:

Length: 13.5 cm. Echogenicity within normal limits. Mild RIGHT
hydronephrosis.

Bladder:

Appears normal for degree of bladder distention. Bilateral ureteral
jets present.
IMPRESSION: Mild bilateral hydronephrosis.

## 2016-08-12 ENCOUNTER — Encounter: Payer: Self-pay | Admitting: Physician Assistant

## 2016-08-13 ENCOUNTER — Other Ambulatory Visit: Payer: Self-pay | Admitting: Physician Assistant

## 2016-08-13 DIAGNOSIS — S22000S Wedge compression fracture of unspecified thoracic vertebra, sequela: Secondary | ICD-10-CM

## 2016-08-17 ENCOUNTER — Other Ambulatory Visit: Payer: Self-pay | Admitting: Physician Assistant

## 2016-08-26 DIAGNOSIS — M4696 Unspecified inflammatory spondylopathy, lumbar region: Secondary | ICD-10-CM | POA: Diagnosis not present

## 2016-08-26 DIAGNOSIS — M545 Low back pain: Secondary | ICD-10-CM | POA: Diagnosis not present

## 2016-09-03 DIAGNOSIS — M5416 Radiculopathy, lumbar region: Secondary | ICD-10-CM | POA: Diagnosis not present

## 2016-09-03 DIAGNOSIS — M546 Pain in thoracic spine: Secondary | ICD-10-CM | POA: Diagnosis not present

## 2016-09-09 DIAGNOSIS — M545 Low back pain: Secondary | ICD-10-CM | POA: Diagnosis not present

## 2016-09-09 DIAGNOSIS — M4696 Unspecified inflammatory spondylopathy, lumbar region: Secondary | ICD-10-CM | POA: Diagnosis not present

## 2016-09-10 ENCOUNTER — Encounter: Payer: Self-pay | Admitting: Physician Assistant

## 2016-09-10 ENCOUNTER — Ambulatory Visit (HOSPITAL_BASED_OUTPATIENT_CLINIC_OR_DEPARTMENT_OTHER)
Admission: RE | Admit: 2016-09-10 | Discharge: 2016-09-10 | Disposition: A | Discharge status: Home / Self Care | Payer: Medicare Other | Source: Ambulatory Visit | Attending: Physician Assistant | Admitting: Physician Assistant

## 2016-09-10 DIAGNOSIS — N1330 Unspecified hydronephrosis: Secondary | ICD-10-CM | POA: Diagnosis not present

## 2016-09-11 ENCOUNTER — Encounter: Payer: Self-pay | Admitting: Physician Assistant

## 2016-09-11 ENCOUNTER — Other Ambulatory Visit: Payer: Self-pay | Admitting: Physician Assistant

## 2016-09-11 DIAGNOSIS — N1330 Unspecified hydronephrosis: Secondary | ICD-10-CM

## 2016-09-11 DIAGNOSIS — N134 Hydroureter: Secondary | ICD-10-CM | POA: Diagnosis not present

## 2016-09-12 ENCOUNTER — Encounter: Payer: Self-pay | Admitting: Physician Assistant

## 2016-09-12 DIAGNOSIS — M47816 Spondylosis without myelopathy or radiculopathy, lumbar region: Secondary | ICD-10-CM | POA: Diagnosis not present

## 2016-09-17 DIAGNOSIS — N134 Hydroureter: Secondary | ICD-10-CM | POA: Diagnosis not present

## 2016-09-17 DIAGNOSIS — N2889 Other specified disorders of kidney and ureter: Secondary | ICD-10-CM | POA: Diagnosis not present

## 2016-09-17 DIAGNOSIS — I700 Atherosclerosis of aorta: Secondary | ICD-10-CM | POA: Diagnosis not present

## 2016-09-24 DIAGNOSIS — M47816 Spondylosis without myelopathy or radiculopathy, lumbar region: Secondary | ICD-10-CM | POA: Diagnosis not present

## 2016-09-30 DIAGNOSIS — N1339 Other hydronephrosis: Secondary | ICD-10-CM | POA: Diagnosis not present

## 2016-10-02 ENCOUNTER — Ambulatory Visit (HOSPITAL_COMMUNITY)
Admission: RE | Admit: 2016-10-02 | Discharge: 2016-10-02 | Disposition: A | Discharge status: Home / Self Care | Payer: Medicare Other | Source: Ambulatory Visit | Attending: Nurse Practitioner | Admitting: Nurse Practitioner

## 2016-10-02 ENCOUNTER — Encounter (HOSPITAL_COMMUNITY): Payer: Self-pay | Admitting: Nurse Practitioner

## 2016-10-02 VITALS — BP 116/72 | HR 69 | Ht 67.0 in | Wt 220.0 lb

## 2016-10-02 DIAGNOSIS — Z7901 Long term (current) use of anticoagulants: Secondary | ICD-10-CM | POA: Diagnosis not present

## 2016-10-02 DIAGNOSIS — J45909 Unspecified asthma, uncomplicated: Secondary | ICD-10-CM | POA: Diagnosis not present

## 2016-10-02 DIAGNOSIS — E785 Hyperlipidemia, unspecified: Secondary | ICD-10-CM | POA: Insufficient documentation

## 2016-10-02 DIAGNOSIS — I480 Paroxysmal atrial fibrillation: Secondary | ICD-10-CM | POA: Diagnosis not present

## 2016-10-02 DIAGNOSIS — M8580 Other specified disorders of bone density and structure, unspecified site: Secondary | ICD-10-CM | POA: Insufficient documentation

## 2016-10-02 DIAGNOSIS — K219 Gastro-esophageal reflux disease without esophagitis: Secondary | ICD-10-CM | POA: Diagnosis not present

## 2016-10-02 DIAGNOSIS — F329 Major depressive disorder, single episode, unspecified: Secondary | ICD-10-CM | POA: Diagnosis not present

## 2016-10-02 DIAGNOSIS — Z79899 Other long term (current) drug therapy: Secondary | ICD-10-CM | POA: Insufficient documentation

## 2016-10-02 DIAGNOSIS — I10 Essential (primary) hypertension: Secondary | ICD-10-CM | POA: Diagnosis not present

## 2016-10-02 DIAGNOSIS — I4891 Unspecified atrial fibrillation: Secondary | ICD-10-CM

## 2016-10-02 DIAGNOSIS — G4733 Obstructive sleep apnea (adult) (pediatric): Secondary | ICD-10-CM | POA: Insufficient documentation

## 2016-10-02 DIAGNOSIS — F419 Anxiety disorder, unspecified: Secondary | ICD-10-CM | POA: Insufficient documentation

## 2016-10-02 DIAGNOSIS — Z87891 Personal history of nicotine dependence: Secondary | ICD-10-CM | POA: Insufficient documentation

## 2016-10-02 DIAGNOSIS — E559 Vitamin D deficiency, unspecified: Secondary | ICD-10-CM | POA: Diagnosis not present

## 2016-10-02 MED ORDER — ESOMEPRAZOLE MAGNESIUM 20 MG PO CPDR: 20.0000 mg | DELAYED_RELEASE_CAPSULE | Freq: Every day | ORAL | 0 refills | Status: DC

## 2016-10-02 NOTE — Progress Notes (Signed)
Primary Care Physician: Brunetta Jeans, PA-C Referring Physician:Dr. Brien Few Shannon [name] is a 66 y.o. female with a h/o afib s/p ablation 03/26/16 by Dr. Rayann Heman, in the [facility] for f/u. She did go to the ER 3/21 for rt groin pain, with no significant findings. She is reporting no afib. She has noticed more indigestion. She is off antiarrythmic. Takes xarelto with a chadsvasc score of at least 4.  Today, she denies symptoms of palpitations, chest pain, shortness of breath, orthopnea, PND, lower extremity edema, dizziness, presyncope, syncope, or neurologic sequela. The patient is tolerating medications without difficulties and is otherwise without complaint today.   Past Medical History:  Diagnosis Date  . Anxiety   . Arthritis    "neck and lower back" (03/25/2016)  . Asthma 1990s X 1   "short term inhaler use"   . Chronic lower back pain   . Degenerative disorder of bone   . Depression   . Diastolic dysfunction   . Drug-induced lupus erythematosus    HCTZ induced; "still gettin over it" (03/25/2016)  . GERD (gastroesophageal reflux disease)   . Herniated disc, cervical   . Hyperlipidemia   . Hypertension   . Neuromuscular disorder ([place])    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 HTN  . Orthostatic hypotension   . OSA on CPAP   . Osteopenia   . PAF (paroxysmal atrial fibrillation) ([place])   . Pinched nerve in neck   . T12 compression fracture ([place]) 11/2015  . Vitamin D deficiency   . Whiplash injury 06/07/2010   Past Surgical History:  Procedure Laterality Date  . APPENDECTOMY  1990s  . ATRIAL FIBRILLATION ABLATION N/A 03/25/2016   Procedure: Atrial Fibrillation Ablation;  Surgeon: [name], MD;  Location: [facility];  Service: Cardiovascular;  Laterality: N/A;  . FOREARM FRACTURE SURGERY Left ~ 02/2011   "broke arm; shattered wrist"  . FOREARM HARDWARE REMOVAL Right ~ 07/2011  . FRACTURE SURGERY    . TEE WITHOUT CARDIOVERSION N/A  03/24/2016   Procedure: TRANSESOPHAGEAL ECHOCARDIOGRAM (TEE);  Surgeon: Sanda Klein, MD;  Location: [facility] ENDOSCOPY;  Service: Cardiovascular;  Laterality: N/A;    Current Outpatient Prescriptions  Medication Sig Dispense Refill  . acetaminophen (TYLENOL) 650 MG CR tablet Take 650-1,300 mg by mouth every 8 (eight) hours as needed for pain.     Marland Kitchen ALPRAZolam (XANAX) 0.5 MG tablet TAKE ONE TABLET BY MOUTH THREE TIMES DAILY AS NEEDED FOR ANXIETY 30 tablet 0  . amLODipine (NORVASC) 5 MG tablet Take 1 tablet (5 mg total) by mouth daily.    . carvedilol (COREG) 12.5 MG tablet Take 1 tablet (12.5 mg total) by mouth 2 (two) times daily. 180 tablet 1  . cholecalciferol (VITAMIN D) 1000 units tablet Take 1,000 Units by mouth daily.    . cyanocobalamin 500 MCG tablet Take 1 tablet (500 mcg total) by mouth daily. 90 tablet 2  . DULoxetine (CYMBALTA) 60 MG capsule TAKE 1 CAPSULE BY MOUTH EVERY DAY 90 capsule 1  . Ferrous Sulfate (IRON) 325 (65 Fe) MG TABS Take 1 tablet by mouth daily. 90 each 2  . Multiple Vitamins-Minerals (MULTIVITAMIN) tablet Take 1 tablet by mouth daily. 30 tablet 0  . rivaroxaban (XARELTO) 20 MG TABS tablet Take 20 mg by mouth daily with supper.    Marland Kitchen tiZANidine (ZANAFLEX) 4 MG capsule Take 4 mg by mouth 3 (three) times daily as needed for muscle spasms.    Marland Kitchen esomeprazole (  NEXIUM) 20 MG capsule Take 1 capsule (20 mg total) by mouth daily. 30 capsule 0   No current facility-administered medications for this encounter.     Allergies  Allergen Reactions  . Ace Inhibitors Cough  . Hctz [Hydrochlorothiazide] Other (See Comments)    Caused drug-induced LUPUS  . Oxycodone Other (See Comments)    Hallucinations  . Prednisone Other (See Comments)    Made patient very aggressive  . Sulfa Antibiotics Nausea And Vomiting  . Sulfur Nausea And Vomiting  . Voltaren [Diclofenac Sodium] Other (See Comments)    Made patient become aggressive    Social History   Social History  . Marital  status: Married    Spouse name: N/A  . Number of children: N/A  . Years of education: N/A   Occupational History  . Not on file.   Social History Main Topics  . Smoking status: Former Smoker    Packs/day: 0.50    Years: 44.00    Types: Cigarettes, E-cigarettes    Quit date: 09/06/2013  . Smokeless tobacco: Never Used  . Alcohol use Yes     Comment: 03/25/2016 "nothing for a couple years now; was having a drink on anniversary and Christmas"  . Drug use: No  . Sexual activity: Not Currently   Other Topics Concern  . Not on file   Social History Narrative   ** Merged History Encounter **        Family History  Problem Relation Age of Onset  . Cancer Mother   . Stroke Father   . Hypertension Father   . Hypertension Maternal Grandmother   . Diabetes Neg Hx     ROS- All systems are reviewed and negative except as per the HPI above  Physical Exam: Vitals:   10/02/16 1137  BP: 116/72  Pulse: 69  Weight: 220 lb (99.8 kg)  Height: 5\' 7"  (1.702 m)   Wt Readings from Last 3 Encounters:  10/02/16 220 lb (99.8 kg)  07/30/16 217 lb (98.4 kg)  06/30/16 216 lb 3.2 oz (98.1 kg)    Labs: Lab Results  Component Value Date   NA 138 05/20/2016   K 4.1 05/20/2016   CL 101 05/20/2016   CO2 31 05/20/2016   GLUCOSE 107 (H) 05/20/2016   BUN 11 05/20/2016   CREATININE 0.74 05/20/2016   CALCIUM 9.7 05/20/2016   PHOS 4.6 11/26/2015   MG 2.1 12/08/2015   Lab Results  Component Value Date   INR 1.22 04/03/2016   Lab Results  Component Value Date   CHOL 189 12/08/2015   HDL 48 12/08/2015   LDLCALC 118 (H) 12/08/2015   TRIG 116 12/08/2015     GEN- The patient is well appearing, alert and oriented x 3 today.   Head- normocephalic, atraumatic Eyes-  Sclera clear, conjunctiva pink Ears- hearing intact Oropharynx- clear Neck- supple, no JVP Lymph- no cervical lymphadenopathy Lungs- Clear to ausculation bilaterally, normal work of breathing Heart- Regular rate and  rhythm, no murmurs, rubs or gallops, PMI not laterally displaced GI- soft, NT, ND, + BS Extremities- no clubbing, cyanosis, or edema MS- no significant deformity or atrophy Skin- no rash or lesion Psych- euthymic mood, full affect Neuro- strength and sensation are intact  EKG-sinus rhythm, at 69 bpm, pr int 142 ms, qrs int 80 ms, qtc 458 ms Epic records reviewed    Assessment and Plan: 1. afib S/p ablation with no reported afib burden   Off flecainide   Continue carvedilol  Continue  xarelto for chadsvasc score of at least 4  2. HTN Stable  3. C/o indigestion following meals and at HS Had been on Nexxium in the past Will rx Nexium 20 mg  a day x one month, if helps , can speak to PCP re being on long term Reviewed how to minimize indigestion symptoms  Afib clinic as needed Dr. Lovena Le as scheduled in December  Carmon Sahli C. Warnie Belair, [facility] [address] [place], [place] [address] [phone]

## 2016-10-02 NOTE — Patient Instructions (Signed)
Your physician has recommended you make the following change in your medication:  1)Nexium 20mg  once a day -- if improvement talk with primary physician

## 2016-10-21 DIAGNOSIS — M47816 Spondylosis without myelopathy or radiculopathy, lumbar region: Secondary | ICD-10-CM | POA: Diagnosis not present

## 2016-10-30 ENCOUNTER — Other Ambulatory Visit (HOSPITAL_COMMUNITY): Payer: Self-pay | Admitting: Nurse Practitioner

## 2016-11-06 DIAGNOSIS — M47816 Spondylosis without myelopathy or radiculopathy, lumbar region: Secondary | ICD-10-CM | POA: Diagnosis not present

## 2016-11-07 ENCOUNTER — Other Ambulatory Visit (HOSPITAL_COMMUNITY): Payer: Self-pay | Admitting: Nurse Practitioner

## 2016-11-20 DIAGNOSIS — Z23 Encounter for immunization: Secondary | ICD-10-CM | POA: Diagnosis not present

## 2016-11-25 DIAGNOSIS — M47816 Spondylosis without myelopathy or radiculopathy, lumbar region: Secondary | ICD-10-CM | POA: Diagnosis not present

## 2016-12-04 ENCOUNTER — Telehealth (HOSPITAL_COMMUNITY): Payer: Self-pay | Admitting: *Deleted

## 2016-12-04 NOTE — Telephone Encounter (Signed)
This patient called to let us know that she has a nerve ablation with Dr. Patrick Jupiter at [place] scheduled for December 12th, however, they did mention stopping her blood thinner xarelto.  She would like to know if this is would be okay for her.  Please advise.  This patient is Dr. Forde Dandy.   Thank you

## 2016-12-04 NOTE — Telephone Encounter (Signed)
Faxed back to number provided.

## 2016-12-04 NOTE — Telephone Encounter (Signed)
Patient with diagnosis of Afib on Xarelto for anticoagulation.    Procedure: Lumbar nerve ablation Date of procedure: 12/17/16  CHADS2-VASc score of  4 (CHF, HTN, AGE, DM2, stroke/tia x 2, CAD, AGE, female)  CrCl 124mL/min  Per office protocol, patient can hold Xarelto for 2-3 days prior to procedure.    Spoke with Dr. Leland Her office and request hold Xarelto for 2 days prior to procedure.   Fax [phone] - Edgardo Roys

## 2016-12-12 ENCOUNTER — Encounter: Payer: Self-pay | Admitting: Internal Medicine

## 2016-12-17 ENCOUNTER — Telehealth: Payer: Self-pay | Admitting: Internal Medicine

## 2016-12-17 NOTE — Telephone Encounter (Signed)
Clearance in Epic chart from 12/04/16, refaxed to number provided in message, Attn Allissa.  Called spoke with pt made aware clearance was faxed on 12/04/16 and I refaxed today to [phone] attn Allissa as requested in 12/04/16 phone note.  She will contact office to confirm receipt.

## 2016-12-17 NOTE — Telephone Encounter (Signed)
Shannon [name] is calling because Dr. Mina Marble office states that they did not received the clearance to stop her Xarelto . If you could please . Thanks

## 2016-12-31 ENCOUNTER — Encounter: Payer: Self-pay | Admitting: Internal Medicine

## 2016-12-31 ENCOUNTER — Ambulatory Visit (INDEPENDENT_AMBULATORY_CARE_PROVIDER_SITE_OTHER): Payer: Medicare Other | Admitting: Internal Medicine

## 2016-12-31 VITALS — BP 140/80 | HR 66 | Ht 67.0 in | Wt 219.4 lb

## 2016-12-31 DIAGNOSIS — I480 Paroxysmal atrial fibrillation: Secondary | ICD-10-CM

## 2016-12-31 DIAGNOSIS — I10 Essential (primary) hypertension: Secondary | ICD-10-CM | POA: Diagnosis not present

## 2016-12-31 NOTE — Patient Instructions (Signed)

## 2016-12-31 NOTE — Progress Notes (Signed)
HPI Shannon [name] returns today for ongoing evaluation and management of atrial fib and bradycardia. She is s/p EPS and RFA of atrial fib about a year ago. In the interim, she has done well execpt for some back problems. She denies chest pain or sob. No syncope. She is limited by her back. When she bends over she has bendopnea.  Allergies  Allergen Reactions  . Ace Inhibitors Cough  . Hctz [Hydrochlorothiazide] Other (See Comments)    Caused drug-induced LUPUS  . Oxycodone Other (See Comments)    Hallucinations  . Prednisone Other (See Comments)    Made patient very aggressive  . Sulfa Antibiotics Nausea And Vomiting  . Sulfur Nausea And Vomiting  . Voltaren [Diclofenac Sodium] Other (See Comments)    Made patient become aggressive     Current Outpatient Medications  Medication Sig Dispense Refill  . acetaminophen (TYLENOL) 650 MG CR tablet Take 650-1,300 mg by mouth every 8 (eight) hours as needed for pain.     Marland Kitchen ALPRAZolam (XANAX) 0.5 MG tablet TAKE ONE TABLET BY MOUTH THREE TIMES DAILY AS NEEDED FOR ANXIETY 30 tablet 0  . amLODipine (NORVASC) 5 MG tablet Take 1 tablet (5 mg total) by mouth daily.    . carvedilol (COREG) 12.5 MG tablet Take 1 tablet (12.5 mg total) by mouth 2 (two) times daily. 180 tablet 1  . cholecalciferol (VITAMIN D) 1000 units tablet Take 1,000 Units by mouth daily.    . cyanocobalamin 500 MCG tablet Take 1 tablet (500 mcg total) by mouth daily. 90 tablet 2  . DULoxetine (CYMBALTA) 60 MG capsule TAKE 1 CAPSULE BY MOUTH EVERY DAY 90 capsule 1  . esomeprazole (NEXIUM) 20 MG capsule Take 1 capsule (20 mg total) by mouth daily. 30 capsule 0  . Ferrous Sulfate (IRON) 325 (65 Fe) MG TABS Take 1 tablet by mouth daily. 90 each 2  . Multiple Vitamins-Minerals (MULTIVITAMIN) tablet Take 1 tablet by mouth daily. 30 tablet 0  . rivaroxaban (XARELTO) 20 MG TABS tablet Take 20 mg by mouth daily with supper.    Marland Kitchen tiZANidine (ZANAFLEX) 4 MG capsule Take 4 mg by mouth 3  (three) times daily as needed for muscle spasms.     No current facility-administered medications for this visit.      Past Medical History:  Diagnosis Date  . Anxiety   . Arthritis    "neck and lower back" (03/25/2016)  . Asthma 1990s X 1   "short term inhaler use"   . Chronic lower back pain   . Degenerative disorder of bone   . Depression   . Diastolic dysfunction   . Drug-induced lupus erythematosus    HCTZ induced; "still gettin over it" (03/25/2016)  . GERD (gastroesophageal reflux disease)   . Herniated disc, cervical   . Hyperlipidemia   . Hypertension   . Neuromuscular disorder ([place])    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 HTN  . Orthostatic hypotension   . OSA on CPAP   . Osteopenia   . PAF (paroxysmal atrial fibrillation) ([place])   . Pinched nerve in neck   . T12 compression fracture ([place]) 11/2015  . Vitamin D deficiency   . Whiplash injury 06/07/2010    ROS:   All systems reviewed and negative except as noted in the HPI.   Past Surgical History:  Procedure Laterality Date  . APPENDECTOMY  1990s  . ATRIAL FIBRILLATION ABLATION N/A 03/25/2016   Procedure: Atrial Fibrillation Ablation;  Surgeon: [name], MD;  Location: [facility];  Service: Cardiovascular;  Laterality: N/A;  . FOREARM FRACTURE SURGERY Left ~ 02/2011   "broke arm; shattered wrist"  . FOREARM HARDWARE REMOVAL Right ~ 07/2011  . FRACTURE SURGERY    . TEE WITHOUT CARDIOVERSION N/A 03/24/2016   Procedure: TRANSESOPHAGEAL ECHOCARDIOGRAM (TEE);  Surgeon: Sanda Klein, MD;  Location: [facility] ENDOSCOPY;  Service: Cardiovascular;  Laterality: N/A;     Family History  Problem Relation Age of Onset  . Cancer Mother   . Stroke Father   . Hypertension Father   . Hypertension Maternal Grandmother   . Diabetes Neg Hx      Social History   Socioeconomic History  . Marital status: Married    Spouse name: Not on file  . Number of children: Not on file  . Years of  education: Not on file  . Highest education level: Not on file  Social Needs  . Financial resource strain: Not on file  . Food insecurity - worry: Not on file  . Food insecurity - inability: Not on file  . Transportation needs - medical: Not on file  . Transportation needs - non-medical: Not on file  Occupational History  . Not on file  Tobacco Use  . Smoking status: Former Smoker    Packs/day: 0.50    Years: 44.00    Pack years: 22.00    Types: Cigarettes, E-cigarettes    Last attempt to quit: 09/06/2013    Years since quitting: 3.3  . Smokeless tobacco: Never Used  Substance and Sexual Activity  . Alcohol use: Yes    Comment: 03/25/2016 "nothing for a couple years now; was having a drink on anniversary and Christmas"  . Drug use: No  . Sexual activity: Not Currently  Other Topics Concern  . Not on file  Social History Narrative   ** Merged History Encounter **         BP 140/80   Pulse 66   Ht 5\' 7"  (1.702 m)   Wt 219 lb 6.4 oz (99.5 kg)   SpO2 96%   BMI 34.36 kg/m   Physical Exam:  Well appearing overweight 66 yo woman, NAD HEENT: Unremarkable Neck:  6 cm JVD, no thyromegally Lymphatics:  No adenopathy Back:  No CVA tenderness Lungs:  Clear with no wheezes HEART:  Regular rate rhythm, no murmurs, no rubs, no clicks Abd:  soft, positive bowel sounds, no organomegally, no rebound, no guarding Ext:  2 plus pulses, no edema, no cyanosis, no clubbing Skin:  No rashes no nodules Neuro:  CN II through XII intact, motor grossly intact  EKG - NSR with NSST abnormality  Assess/Plan: 1. PAF - her symptoms are very well controlled and she is off of her flecainide.  2. HTN - her blood pressure is reasonably well controlled.  3. bendopnea - she has diastolic dysfunction. I have asked that she lose weight, eat less salt, and exercise for 30 minutes 5 times a week.  Cristopher Peru, M.D.

## 2017-01-15 DIAGNOSIS — M47816 Spondylosis without myelopathy or radiculopathy, lumbar region: Secondary | ICD-10-CM | POA: Diagnosis not present

## 2017-01-24 ENCOUNTER — Other Ambulatory Visit: Payer: Self-pay | Admitting: Internal Medicine

## 2017-01-30 ENCOUNTER — Other Ambulatory Visit (HOSPITAL_COMMUNITY): Payer: Self-pay | Admitting: Nurse Practitioner

## 2017-02-09 DIAGNOSIS — M47816 Spondylosis without myelopathy or radiculopathy, lumbar region: Secondary | ICD-10-CM | POA: Diagnosis not present

## 2017-02-14 ENCOUNTER — Other Ambulatory Visit: Payer: Self-pay | Admitting: Physician Assistant

## 2017-02-16 ENCOUNTER — Other Ambulatory Visit: Payer: Self-pay | Admitting: Internal Medicine

## 2017-02-16 NOTE — Telephone Encounter (Signed)
Pt last saw Dr Lovena Le 12/31/16, last labs 05/20/16 Creat 0.74, age 67, weight 99.5kg, CrCl 119.05, based on CrCl pt is on appropriate dosage of Xarelto 20mg  QD.  Will refill rx.

## 2017-03-02 DIAGNOSIS — M47816 Spondylosis without myelopathy or radiculopathy, lumbar region: Secondary | ICD-10-CM | POA: Diagnosis not present

## 2017-03-05 ENCOUNTER — Ambulatory Visit: Payer: Medicare Other | Admitting: Psychology

## 2017-03-05 ENCOUNTER — Ambulatory Visit (INDEPENDENT_AMBULATORY_CARE_PROVIDER_SITE_OTHER): Payer: Medicare Other | Admitting: Psychology

## 2017-03-05 DIAGNOSIS — F331 Major depressive disorder, recurrent, moderate: Secondary | ICD-10-CM

## 2017-03-09 DIAGNOSIS — M545 Low back pain: Secondary | ICD-10-CM | POA: Diagnosis not present

## 2017-03-09 DIAGNOSIS — M546 Pain in thoracic spine: Secondary | ICD-10-CM | POA: Diagnosis not present

## 2017-03-16 ENCOUNTER — Other Ambulatory Visit: Payer: Self-pay | Admitting: Physician Assistant

## 2017-03-16 DIAGNOSIS — D508 Other iron deficiency anemias: Secondary | ICD-10-CM

## 2017-03-17 ENCOUNTER — Ambulatory Visit (INDEPENDENT_AMBULATORY_CARE_PROVIDER_SITE_OTHER): Payer: Medicare Other | Admitting: Psychology

## 2017-03-17 DIAGNOSIS — F331 Major depressive disorder, recurrent, moderate: Secondary | ICD-10-CM

## 2017-03-19 DIAGNOSIS — M545 Low back pain: Secondary | ICD-10-CM | POA: Diagnosis not present

## 2017-03-19 DIAGNOSIS — M546 Pain in thoracic spine: Secondary | ICD-10-CM | POA: Diagnosis not present

## 2017-03-24 DIAGNOSIS — M546 Pain in thoracic spine: Secondary | ICD-10-CM | POA: Diagnosis not present

## 2017-03-26 DIAGNOSIS — B349 Viral infection, unspecified: Secondary | ICD-10-CM | POA: Diagnosis not present

## 2017-03-26 DIAGNOSIS — J3089 Other allergic rhinitis: Secondary | ICD-10-CM | POA: Diagnosis not present

## 2017-03-30 ENCOUNTER — Ambulatory Visit (INDEPENDENT_AMBULATORY_CARE_PROVIDER_SITE_OTHER): Payer: Medicare Other | Admitting: Psychology

## 2017-03-30 ENCOUNTER — Ambulatory Visit: Payer: Medicare Other | Admitting: Psychology

## 2017-03-30 DIAGNOSIS — F331 Major depressive disorder, recurrent, moderate: Secondary | ICD-10-CM

## 2017-03-31 DIAGNOSIS — M6281 Muscle weakness (generalized): Secondary | ICD-10-CM | POA: Diagnosis not present

## 2017-03-31 DIAGNOSIS — M546 Pain in thoracic spine: Secondary | ICD-10-CM | POA: Diagnosis not present

## 2017-04-01 DIAGNOSIS — M546 Pain in thoracic spine: Secondary | ICD-10-CM | POA: Diagnosis not present

## 2017-04-01 DIAGNOSIS — M6281 Muscle weakness (generalized): Secondary | ICD-10-CM | POA: Diagnosis not present

## 2017-04-07 DIAGNOSIS — M6281 Muscle weakness (generalized): Secondary | ICD-10-CM | POA: Diagnosis not present

## 2017-04-07 DIAGNOSIS — M546 Pain in thoracic spine: Secondary | ICD-10-CM | POA: Diagnosis not present

## 2017-04-10 DIAGNOSIS — M6281 Muscle weakness (generalized): Secondary | ICD-10-CM | POA: Diagnosis not present

## 2017-04-10 DIAGNOSIS — M546 Pain in thoracic spine: Secondary | ICD-10-CM | POA: Diagnosis not present

## 2017-04-14 DIAGNOSIS — M546 Pain in thoracic spine: Secondary | ICD-10-CM | POA: Diagnosis not present

## 2017-04-14 DIAGNOSIS — M6281 Muscle weakness (generalized): Secondary | ICD-10-CM | POA: Diagnosis not present

## 2017-04-17 DIAGNOSIS — M6281 Muscle weakness (generalized): Secondary | ICD-10-CM | POA: Diagnosis not present

## 2017-04-17 DIAGNOSIS — M546 Pain in thoracic spine: Secondary | ICD-10-CM | POA: Diagnosis not present

## 2017-04-20 DIAGNOSIS — M6281 Muscle weakness (generalized): Secondary | ICD-10-CM | POA: Diagnosis not present

## 2017-04-20 DIAGNOSIS — M546 Pain in thoracic spine: Secondary | ICD-10-CM | POA: Diagnosis not present

## 2017-04-21 ENCOUNTER — Ambulatory Visit (INDEPENDENT_AMBULATORY_CARE_PROVIDER_SITE_OTHER): Payer: Medicare Other | Admitting: Psychology

## 2017-04-21 DIAGNOSIS — F331 Major depressive disorder, recurrent, moderate: Secondary | ICD-10-CM | POA: Diagnosis not present

## 2017-04-22 DIAGNOSIS — M546 Pain in thoracic spine: Secondary | ICD-10-CM | POA: Diagnosis not present

## 2017-04-22 DIAGNOSIS — M6281 Muscle weakness (generalized): Secondary | ICD-10-CM | POA: Diagnosis not present

## 2017-05-01 DIAGNOSIS — M6281 Muscle weakness (generalized): Secondary | ICD-10-CM | POA: Diagnosis not present

## 2017-05-01 DIAGNOSIS — M546 Pain in thoracic spine: Secondary | ICD-10-CM | POA: Diagnosis not present

## 2017-05-05 DIAGNOSIS — M546 Pain in thoracic spine: Secondary | ICD-10-CM | POA: Diagnosis not present

## 2017-05-05 DIAGNOSIS — M6281 Muscle weakness (generalized): Secondary | ICD-10-CM | POA: Diagnosis not present

## 2017-05-08 DIAGNOSIS — M546 Pain in thoracic spine: Secondary | ICD-10-CM | POA: Diagnosis not present

## 2017-05-08 DIAGNOSIS — M6281 Muscle weakness (generalized): Secondary | ICD-10-CM | POA: Diagnosis not present

## 2017-05-12 ENCOUNTER — Other Ambulatory Visit: Payer: Self-pay | Admitting: Physician Assistant

## 2017-05-12 ENCOUNTER — Ambulatory Visit (INDEPENDENT_AMBULATORY_CARE_PROVIDER_SITE_OTHER): Payer: Medicare Other | Admitting: Psychology

## 2017-05-12 DIAGNOSIS — F331 Major depressive disorder, recurrent, moderate: Secondary | ICD-10-CM | POA: Diagnosis not present

## 2017-05-12 DIAGNOSIS — M546 Pain in thoracic spine: Secondary | ICD-10-CM | POA: Diagnosis not present

## 2017-05-12 DIAGNOSIS — M6281 Muscle weakness (generalized): Secondary | ICD-10-CM | POA: Diagnosis not present

## 2017-05-15 DIAGNOSIS — M546 Pain in thoracic spine: Secondary | ICD-10-CM | POA: Diagnosis not present

## 2017-05-15 DIAGNOSIS — M6281 Muscle weakness (generalized): Secondary | ICD-10-CM | POA: Diagnosis not present

## 2017-05-18 DIAGNOSIS — M546 Pain in thoracic spine: Secondary | ICD-10-CM | POA: Diagnosis not present

## 2017-05-18 DIAGNOSIS — M6281 Muscle weakness (generalized): Secondary | ICD-10-CM | POA: Diagnosis not present

## 2017-06-03 ENCOUNTER — Encounter: Payer: Self-pay | Admitting: Physician Assistant

## 2017-06-03 MED ORDER — LORAZEPAM 0.5 MG PO TABS: 0.5000 mg | ORAL_TABLET | Freq: Two times a day (BID) | ORAL | 0 refills | Status: DC | PRN

## 2017-06-16 ENCOUNTER — Ambulatory Visit (INDEPENDENT_AMBULATORY_CARE_PROVIDER_SITE_OTHER): Payer: Medicare Other | Admitting: Psychology

## 2017-06-16 DIAGNOSIS — F331 Major depressive disorder, recurrent, moderate: Secondary | ICD-10-CM

## 2017-06-19 ENCOUNTER — Other Ambulatory Visit: Payer: Self-pay | Admitting: Physician Assistant

## 2017-07-07 ENCOUNTER — Ambulatory Visit: Payer: Medicare Other | Admitting: Psychology

## 2017-07-14 ENCOUNTER — Other Ambulatory Visit: Payer: Self-pay | Admitting: Internal Medicine

## 2017-07-14 NOTE — Telephone Encounter (Signed)
Pt last saw Dr Lovena Le 12/31/16, last labs per [facility] 09/11/16 Creat 0.300, age 67, weight 99.5kg, CrCl 289.75, based on CrCl pt is on appropriate dosage of Xarelto 20mg  QD.  Will refill rx.

## 2017-07-29 ENCOUNTER — Ambulatory Visit (INDEPENDENT_AMBULATORY_CARE_PROVIDER_SITE_OTHER): Payer: Medicare Other | Admitting: Psychology

## 2017-07-29 DIAGNOSIS — F331 Major depressive disorder, recurrent, moderate: Secondary | ICD-10-CM | POA: Diagnosis not present

## 2017-07-31 ENCOUNTER — Encounter: Payer: Self-pay | Admitting: Physician Assistant

## 2017-07-31 MED ORDER — LORAZEPAM 0.5 MG PO TABS: 0.5000 mg | ORAL_TABLET | Freq: Two times a day (BID) | ORAL | 0 refills | Status: DC | PRN

## 2017-07-31 NOTE — Telephone Encounter (Signed)
Lorazepam last filled on 06/03/17 #15. Please advise

## 2017-07-31 NOTE — Addendum Note (Signed)
Addended by: Brunetta Jeans on: 07/31/2017 01:50 PM   Modules accepted: Orders

## 2017-08-26 ENCOUNTER — Ambulatory Visit (INDEPENDENT_AMBULATORY_CARE_PROVIDER_SITE_OTHER): Payer: Medicare Other | Admitting: Psychology

## 2017-08-26 ENCOUNTER — Ambulatory Visit: Payer: Self-pay | Admitting: Pulmonary Disease

## 2017-08-26 DIAGNOSIS — F331 Major depressive disorder, recurrent, moderate: Secondary | ICD-10-CM

## 2017-09-10 ENCOUNTER — Encounter: Payer: Self-pay | Admitting: Adult Health

## 2017-09-10 ENCOUNTER — Ambulatory Visit (INDEPENDENT_AMBULATORY_CARE_PROVIDER_SITE_OTHER): Payer: Medicare Other | Admitting: Adult Health

## 2017-09-10 VITALS — BP 122/76 | HR 65 | Ht 67.0 in | Wt 222.0 lb

## 2017-09-10 DIAGNOSIS — G4733 Obstructive sleep apnea (adult) (pediatric): Secondary | ICD-10-CM

## 2017-09-10 NOTE — Patient Instructions (Addendum)
Continue on CPAP At bedtime  .  Wear each night for at least 4hr each night .  Work on healthy weight .  CPAP download.  [facility]  Follow up with Dr. Halford Chessman or Anslie Spadafora in 1 year and As needed

## 2017-09-10 NOTE — Progress Notes (Signed)
@Patient  ID: Shannon [name], female    DOB: 1950/11/29, 67 y.o.   MRN: [id number]    Referring provider: Delorse Limber  HPI: 67 year old female followed for obstructive sleep apnea.  Patient has been on CPAP for many years previously moved from [place] to [place].  09/10/2017 Follow up : OSA  Patient presents for a follow-up for sleep apnea.  Patient was last seen in the office in 2017.  Patient says she is doing very well on CPAP.  She wears it every night getting at least 8 hours of usage every night.  She feels rested with no significant daytime sleepiness.  Patient says she has been having to get her supplies online.  It would like to try to get a local home care company.  She says that she feels rested with no significant daytime sleepiness and feels that she benefits from CPAP.  She says she cannot go without her CPAP.Marland Kitchen   Allergies  Allergen Reactions  . Ace Inhibitors Cough  . Hctz [Hydrochlorothiazide] Other (See Comments)    Caused drug-induced LUPUS  . Oxycodone Other (See Comments)    Hallucinations  . Prednisone Other (See Comments)    Made patient very aggressive  . Sulfa Antibiotics Nausea And Vomiting  . Sulfur Nausea And Vomiting  . Voltaren [Diclofenac Sodium] Other (See Comments)    Made patient become aggressive    Immunization History  Administered Date(s) Administered  . Influenza,inj,Quad PF,6+ Mos 12/07/2014, 11/27/2015  . Influenza-Unspecified 10/06/2013  . Pneumococcal Conjugate-13 09/23/2011  . Tdap 09/08/2006  . Zoster 09/23/2011    Past Medical History:  Diagnosis Date  . Anxiety   . Arthritis    "neck and lower back" (03/25/2016)  . Asthma 1990s X 1   "short term inhaler use"   . Chronic lower back pain   . Degenerative disorder of bone   . Depression   . Diastolic dysfunction   . Drug-induced lupus erythematosus    HCTZ induced; "still gettin over it" (03/25/2016)  . GERD (gastroesophageal reflux disease)   .  Herniated disc, cervical   . Hyperlipidemia   . Hypertension   . Neuromuscular disorder ([place])    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 HTN  . Orthostatic hypotension   . OSA on CPAP   . Osteopenia   . PAF (paroxysmal atrial fibrillation) ([place])   . Pinched nerve in neck   . T12 compression fracture ([place]) 11/2015  . Vitamin D deficiency   . Whiplash injury 06/07/2010    Tobacco History: Social History   Tobacco Use  Smoking Status Former Smoker  . Packs/day: 0.50  . Years: 44.00  . Pack years: 22.00  . Types: Cigarettes, E-cigarettes  . Last attempt to quit: 09/06/2013  . Years since quitting: 4.0  Smokeless Tobacco Never Used   Counseling given: Not Answered   Outpatient Medications Prior to Visit  Medication Sig Dispense Refill  . acetaminophen (TYLENOL) 650 MG CR tablet Take 650-1,300 mg by mouth every 8 (eight) hours as needed for pain.     Marland Kitchen amLODipine (NORVASC) 10 MG tablet TAKE 1 TABLET BY MOUTH EVERY DAY (Patient taking differently: 5 mg daily. ) 90 tablet 1  . carvedilol (COREG) 12.5 MG tablet Take 1 tablet (12.5 mg total) by mouth 2 (two) times daily. 180 tablet 3  . cholecalciferol (VITAMIN D) 1000 units tablet Take 1,000 Units by mouth daily.    . cyanocobalamin 500 MCG tablet Take 1 tablet (  500 mcg total) by mouth daily. 90 tablet 2  . DULoxetine (CYMBALTA) 60 MG capsule TAKE 1 CAPSULE BY MOUTH EVERY DAY 90 capsule 1  . ferrous sulfate 325 (65 FE) MG tablet TAKE 1 TABLET BY MOUTH EVERY DAY 90 tablet 2  . LORazepam (ATIVAN) 0.5 MG tablet Take 1 tablet (0.5 mg total) by mouth 2 (two) times daily as needed for anxiety. 30 tablet 0  . Multiple Vitamins-Minerals (MULTIVITAMIN) tablet Take 1 tablet by mouth daily. 30 tablet 0  . XARELTO 20 MG TABS tablet TAKE 1 TABLET BY MOUTH EVERY DAY 90 tablet 1  . amLODipine (NORVASC) 5 MG tablet Take 1 tablet (5 mg total) by mouth daily.    Marland Kitchen esomeprazole (NEXIUM) 20 MG capsule Take 1 capsule (20 mg total) by mouth  daily. 30 capsule 0  . tiZANidine (ZANAFLEX) 4 MG capsule Take 4 mg by mouth 3 (three) times daily as needed for muscle spasms.     No facility-administered medications prior to visit.      Review of Systems  Constitutional:   No  weight loss, night sweats,  Fevers, chills, fatigue, or  lassitude.  HEENT:   No headaches,  Difficulty swallowing,  Tooth/dental problems, or  Sore throat,                No sneezing, itching, ear ache, nasal congestion, post nasal drip,   CV:  No chest pain,  Orthopnea, PND, swelling in lower extremities, anasarca, dizziness, palpitations, syncope.   GI  No heartburn, indigestion, abdominal pain, nausea, vomiting, diarrhea, change in bowel habits, loss of appetite, bloody stools.   Resp: No shortness of breath with exertion or at rest.  No excess mucus, no productive cough,  No non-productive cough,  No coughing up of blood.  No change in color of mucus.  No wheezing.  No chest wall deformity  Skin: no rash or lesions.  GU: no dysuria, change in color of urine, no urgency or frequency.  No flank pain, no hematuria   MS:  No joint pain or swelling.  No decreased range of motion.  No back pain.    Physical Exam  BP 122/76 (BP Location: Right Arm, Patient Position: Sitting, Cuff Size: Normal)   Pulse 65   Ht 5\' 7"  (1.702 m)   Wt 222 lb (100.7 kg)   SpO2 97%   BMI 34.77 kg/m   GEN: A/Ox3; pleasant , NAD, obese    HEENT:  [place]/AT,  EACs-clear, TMs-wnl, NOSE-clear, THROAT-clear, no lesions, no postnasal drip or exudate noted. Class 2 MP airway   NECK:  Supple w/ fair ROM; no JVD; normal carotid impulses w/o bruits; no thyromegaly or nodules palpated; no lymphadenopathy.    RESP  Clear  P & A; w/o, wheezes/ rales/ or rhonchi. no accessory muscle use, no dullness to percussion  CARD:  RRR, no m/r/g, no peripheral edema, pulses intact, no cyanosis or clubbing.  GI:   Soft & nt; nml bowel sounds; no organomegaly or masses detected.   Musco: Warm bil,  no deformities or joint swelling noted.   Neuro: alert, no focal deficits noted.    Skin: Warm, no lesions or rashes    Lab Results:  BMET   ProBNP No results found for: PROBNP  Imaging: No results found.   Assessment & Plan:   No problem-specific Assessment & Plan notes found for this encounter.     Rexene Edison, NP 09/10/2017

## 2017-09-11 NOTE — Assessment & Plan Note (Signed)
Well controlled on CPAP  Will return with SD card for download   Plan  Patient Instructions  Continue on CPAP At bedtime  .  Wear each night for at least 4hr each night .  Work on healthy weight .  CPAP download.  [facility].  Follow up with Dr. Halford Chessman or [place] in 1 year and As needed

## 2017-09-11 NOTE — Assessment & Plan Note (Signed)
Wt loss  

## 2017-09-16 ENCOUNTER — Other Ambulatory Visit: Payer: Self-pay | Admitting: Internal Medicine

## 2017-09-23 ENCOUNTER — Ambulatory Visit (INDEPENDENT_AMBULATORY_CARE_PROVIDER_SITE_OTHER): Payer: Medicare Other | Admitting: Psychology

## 2017-09-23 DIAGNOSIS — F331 Major depressive disorder, recurrent, moderate: Secondary | ICD-10-CM

## 2017-10-19 ENCOUNTER — Ambulatory Visit (INDEPENDENT_AMBULATORY_CARE_PROVIDER_SITE_OTHER): Payer: Medicare Other | Admitting: Psychology

## 2017-10-19 DIAGNOSIS — F331 Major depressive disorder, recurrent, moderate: Secondary | ICD-10-CM

## 2017-10-23 ENCOUNTER — Encounter (HOSPITAL_BASED_OUTPATIENT_CLINIC_OR_DEPARTMENT_OTHER): Payer: Self-pay

## 2017-10-23 ENCOUNTER — Ambulatory Visit (HOSPITAL_BASED_OUTPATIENT_CLINIC_OR_DEPARTMENT_OTHER)
Admission: RE | Admit: 2017-10-23 | Discharge: 2017-10-23 | Disposition: A | Discharge status: Home / Self Care | Payer: Medicare Other | Source: Ambulatory Visit | Attending: Family Medicine | Admitting: Family Medicine

## 2017-10-23 ENCOUNTER — Other Ambulatory Visit: Payer: Self-pay | Admitting: Physician Assistant

## 2017-10-23 DIAGNOSIS — Z1231 Encounter for screening mammogram for malignant neoplasm of breast: Secondary | ICD-10-CM

## 2017-10-23 IMAGING — MG DIGITAL SCREENING BILATERAL MAMMOGRAM WITH TOMO AND CAD
8 series · 8 of 24 positions shown · non-contrast
Comparison: Previous exam(s).

CLINICAL DATA: Screening.

EXAM:
DIGITAL SCREENING BILATERAL MAMMOGRAM WITH TOMO AND CAD

[R CC synth-2D]
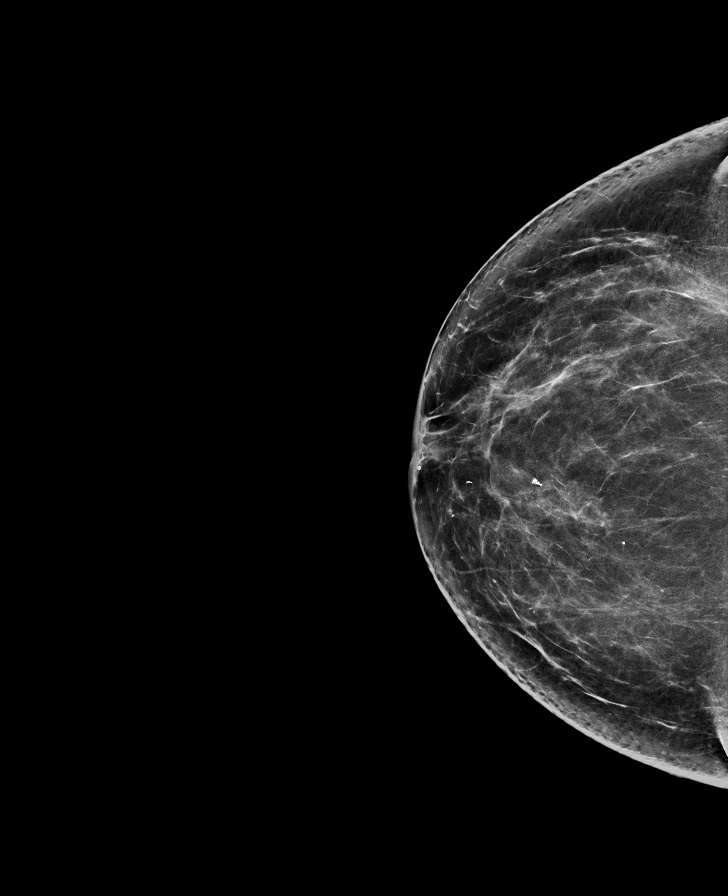

[L CC synth-2D]
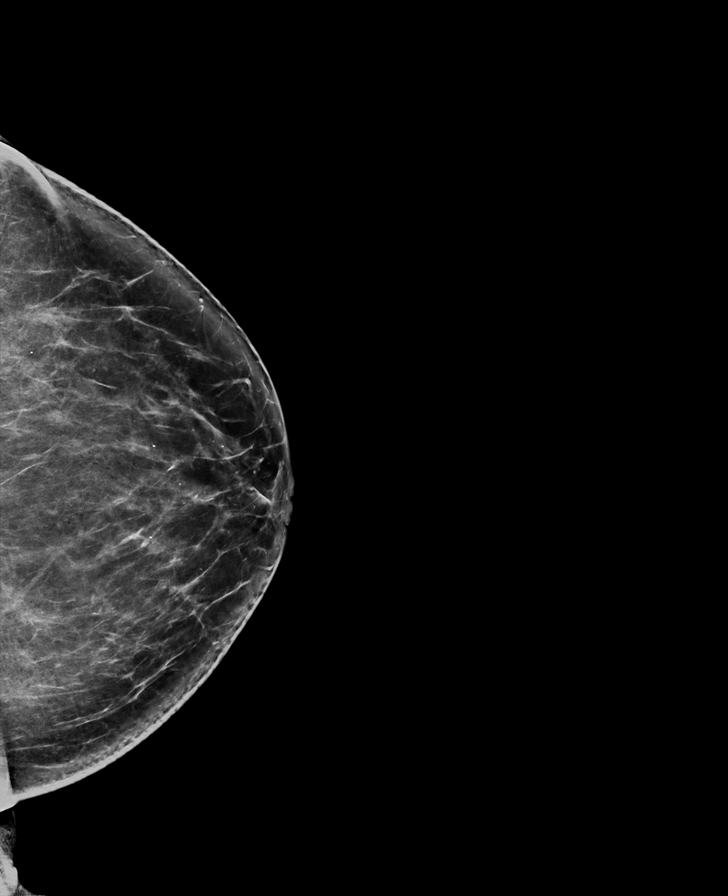

[L MLO synth-2D]
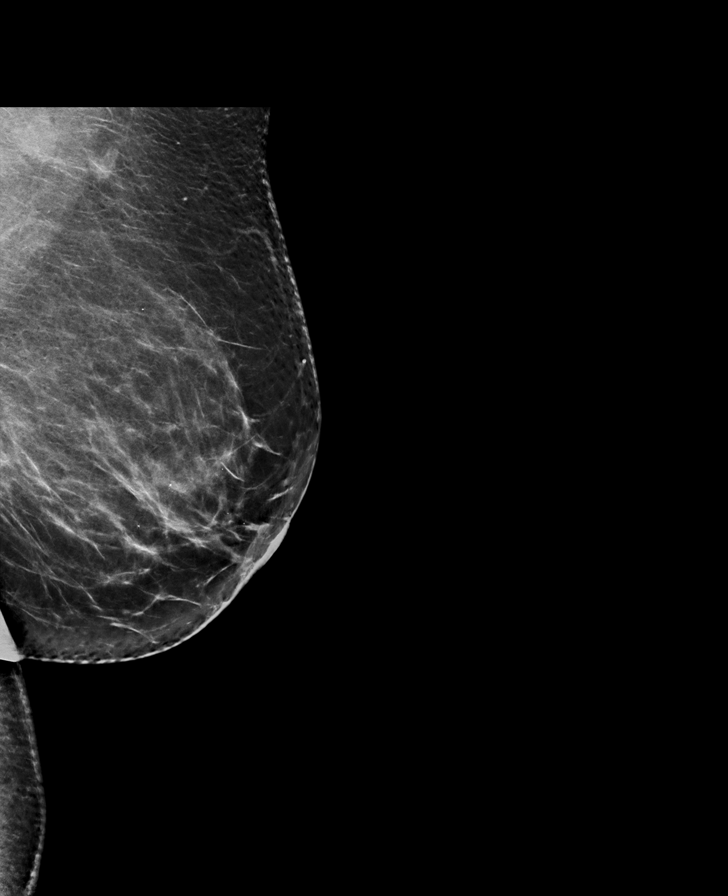

[R MLO synth-2D]
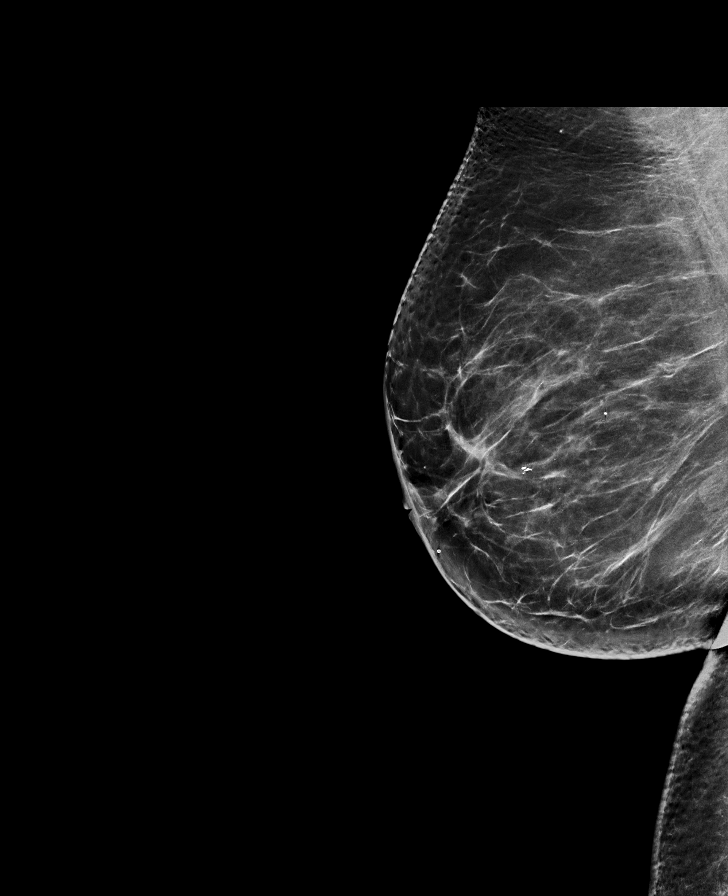

[R CC tomo · tomo slice 40/79.0]
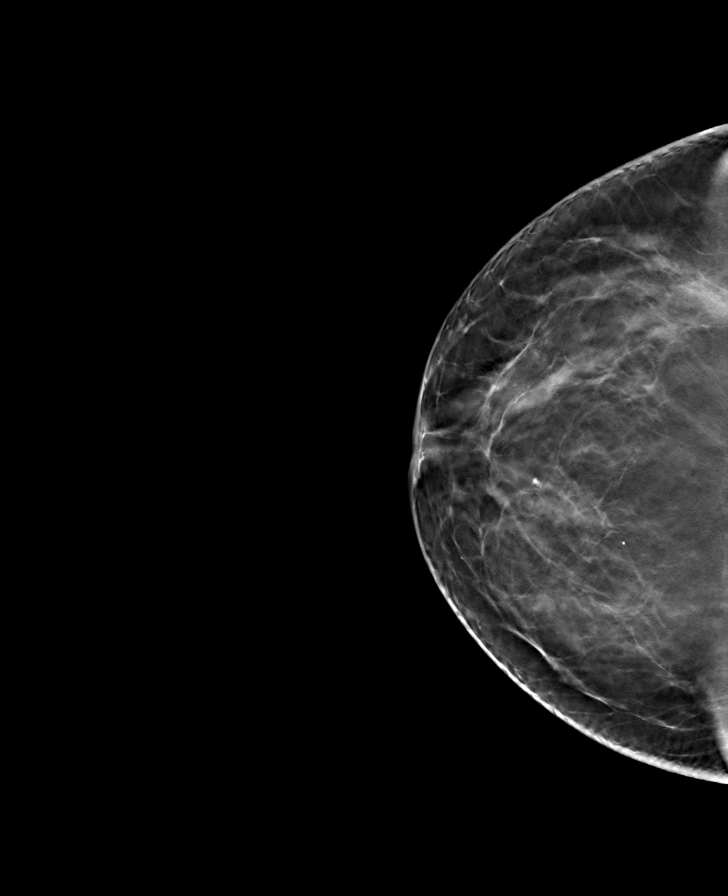

[R MLO tomo · tomo slice 44/87.0]
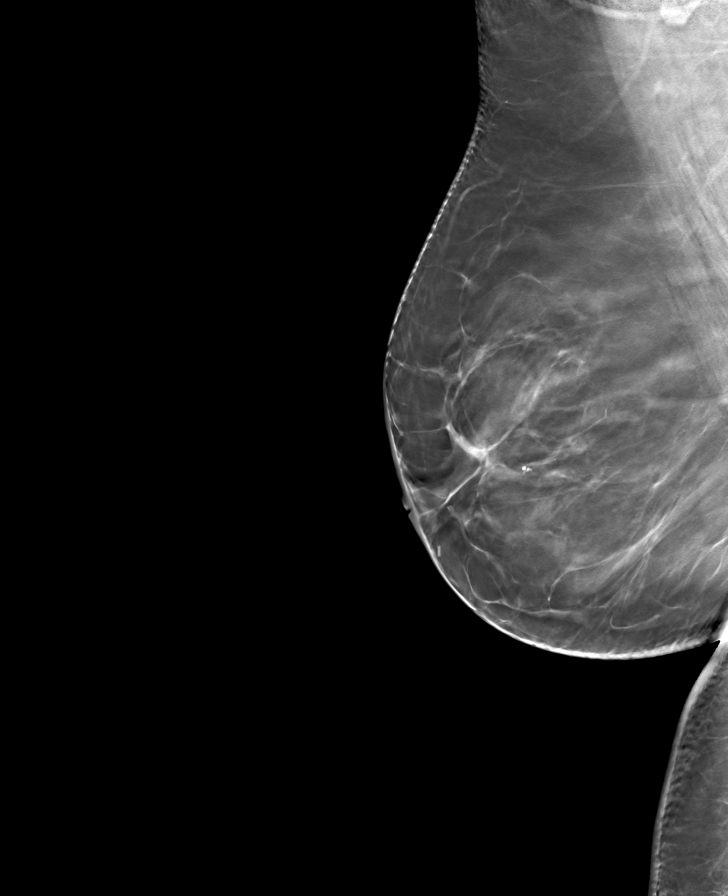

[L CC tomo · tomo slice 40/79.0]
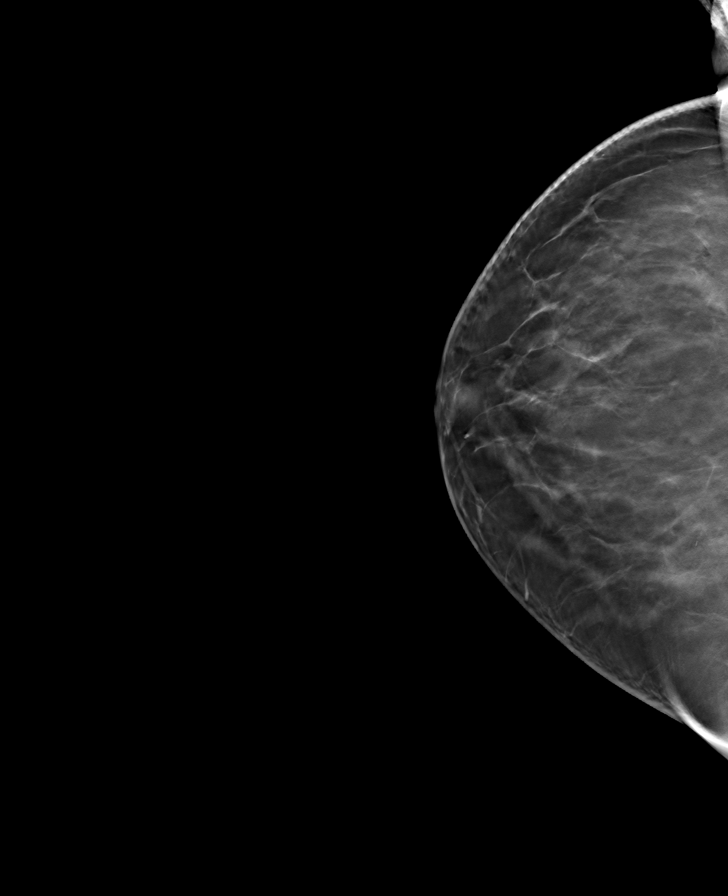

[L MLO tomo · tomo slice 47/92.0]
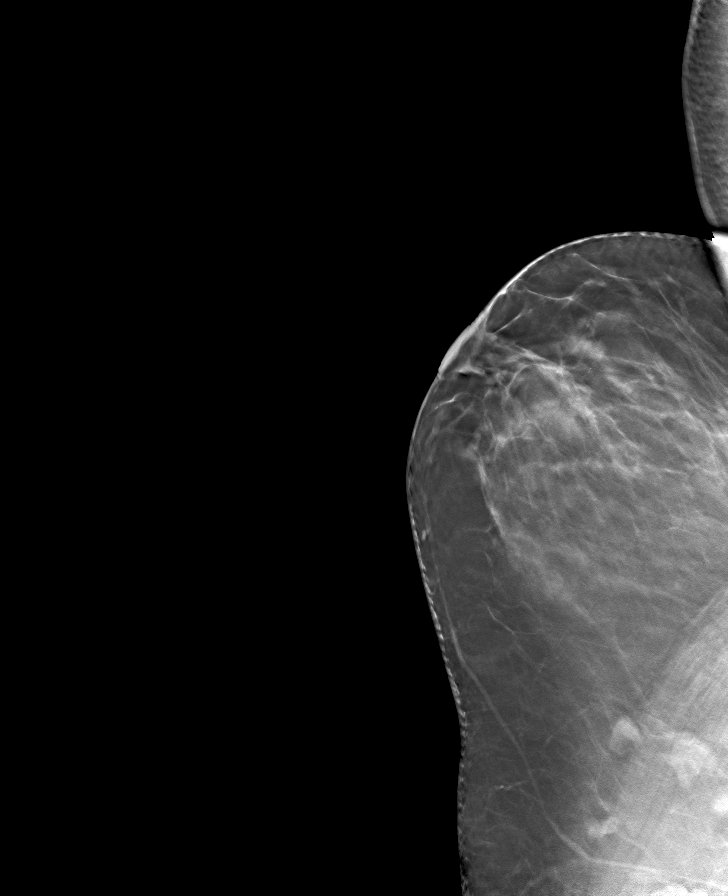

[8 of 24 positions shown; findings below may reference images not displayed]

ACR Breast Density Category b: There are scattered areas of
fibroglandular density.
FINDINGS: There are no findings suspicious for malignancy. Images were
processed with CAD.
IMPRESSION: No mammographic evidence of malignancy. A result letter of this
screening mammogram will be mailed directly to the patient.

RECOMMENDATION:
Screening mammogram in one year. (Code:[TQ])

BI-RADS CATEGORY  1: Negative.

## 2017-10-26 ENCOUNTER — Telehealth: Payer: Self-pay | Admitting: General Practice

## 2017-10-26 NOTE — Telephone Encounter (Signed)
Called pt and LMOVM to inform of Dr. Virgil Benedict recommendation. Need to know what pharmacy pt prefers Rx to be sent to.   [place] for [facility] to Discuss results / PCP recommendations / Schedule patient.

## 2017-10-26 NOTE — Telephone Encounter (Signed)
I don't see any reason in her chart that would prevent her from getting the shingles shot, but Medicare requires this to be done at the pharmacy.  We can send a prescription for Shingrix

## 2017-10-26 NOTE — Telephone Encounter (Signed)
[place] for pt to receive shingles vaccination?   Copied from [place] [id number]. Topic: General - Inquiry >> Oct 23, 2017  5:03 PM Rutherford Nail, [place] wrote: Reason for CRM: patient husband calling to check and see if she could get the shingles shot. Please advise.

## 2017-10-28 ENCOUNTER — Ambulatory Visit (INDEPENDENT_AMBULATORY_CARE_PROVIDER_SITE_OTHER): Payer: Medicare Other

## 2017-10-28 DIAGNOSIS — Z23 Encounter for immunization: Secondary | ICD-10-CM | POA: Diagnosis not present

## 2017-11-03 ENCOUNTER — Other Ambulatory Visit: Payer: Self-pay | Admitting: Physician Assistant

## 2017-11-23 ENCOUNTER — Ambulatory Visit (INDEPENDENT_AMBULATORY_CARE_PROVIDER_SITE_OTHER): Payer: Medicare Other | Admitting: Psychology

## 2017-11-23 DIAGNOSIS — F331 Major depressive disorder, recurrent, moderate: Secondary | ICD-10-CM | POA: Diagnosis not present

## 2017-11-26 ENCOUNTER — Other Ambulatory Visit: Payer: Self-pay | Admitting: Physician Assistant

## 2017-11-26 ENCOUNTER — Encounter: Payer: Self-pay | Admitting: Physician Assistant

## 2017-11-27 MED ORDER — DULOXETINE HCL 60 MG PO CPEP: 60.0000 mg | ORAL_CAPSULE | Freq: Every day | ORAL | 0 refills | Status: DC

## 2017-12-01 ENCOUNTER — Emergency Department (HOSPITAL_BASED_OUTPATIENT_CLINIC_OR_DEPARTMENT_OTHER)
Admission: EM | Admit: 2017-12-01 | Discharge: 2017-12-01 | Disposition: A | Discharge status: Home / Self Care | Payer: Medicare Other | Attending: Emergency Medicine | Admitting: Emergency Medicine

## 2017-12-01 ENCOUNTER — Other Ambulatory Visit: Payer: Self-pay

## 2017-12-01 ENCOUNTER — Encounter (HOSPITAL_BASED_OUTPATIENT_CLINIC_OR_DEPARTMENT_OTHER): Payer: Self-pay | Admitting: Emergency Medicine

## 2017-12-01 DIAGNOSIS — I110 Hypertensive heart disease with heart failure: Secondary | ICD-10-CM | POA: Diagnosis not present

## 2017-12-01 DIAGNOSIS — I5032 Chronic diastolic (congestive) heart failure: Secondary | ICD-10-CM | POA: Diagnosis not present

## 2017-12-01 DIAGNOSIS — Z87891 Personal history of nicotine dependence: Secondary | ICD-10-CM | POA: Diagnosis not present

## 2017-12-01 DIAGNOSIS — I4891 Unspecified atrial fibrillation: Secondary | ICD-10-CM | POA: Diagnosis not present

## 2017-12-01 DIAGNOSIS — R002 Palpitations: Secondary | ICD-10-CM | POA: Diagnosis present

## 2017-12-01 LAB — COMPREHENSIVE METABOLIC PANEL
ALK PHOS: 123 U/L (ref 38–126)
ALT: 19 U/L (ref 0–44)
ANION GAP: 9 (ref 5–15)
AST: 19 U/L (ref 15–41)
Albumin: 4.1 g/dL (ref 3.5–5.0)
BUN: 13 mg/dL (ref 8–23)
CALCIUM: 9.3 mg/dL (ref 8.9–10.3)
CHLORIDE: 104 mmol/L (ref 98–111)
CO2: 25 mmol/L (ref 22–32)
CREATININE: 0.74 mg/dL (ref 0.44–1.00)
Glucose, Bld: 146 mg/dL — ABNORMAL HIGH (ref 70–99)
Potassium: 3.6 mmol/L (ref 3.5–5.1)
SODIUM: 138 mmol/L (ref 135–145)
Total Bilirubin: 0.5 mg/dL (ref 0.3–1.2)
Total Protein: 8 g/dL (ref 6.5–8.1)

## 2017-12-01 LAB — CBC WITH DIFFERENTIAL/PLATELET
Abs Immature Granulocytes: 0.02 10*3/uL (ref 0.00–0.07)
BASOS PCT: 1 %
Basophils Absolute: 0.1 10*3/uL (ref 0.0–0.1)
EOS PCT: 3 %
Eosinophils Absolute: 0.3 10*3/uL (ref 0.0–0.5)
HCT: 41.5 % (ref 36.0–46.0)
Hemoglobin: 13.1 g/dL (ref 12.0–15.0)
Immature Granulocytes: 0 %
Lymphocytes Relative: 19 %
Lymphs Abs: 2 10*3/uL (ref 0.7–4.0)
MCH: 29 pg (ref 26.0–34.0)
MCHC: 31.6 g/dL (ref 30.0–36.0)
MCV: 91.8 fL (ref 80.0–100.0)
MONO ABS: 0.8 10*3/uL (ref 0.1–1.0)
MONOS PCT: 8 %
Neutro Abs: 7.1 10*3/uL (ref 1.7–7.7)
Neutrophils Relative %: 69 %
PLATELETS: 334 10*3/uL (ref 150–400)
RBC: 4.52 MIL/uL (ref 3.87–5.11)
RDW: 13.3 % (ref 11.5–15.5)
WBC: 10.3 10*3/uL (ref 4.0–10.5)
nRBC: 0 % (ref 0.0–0.2)

## 2017-12-01 MED ORDER — CARVEDILOL 12.5 MG PO TABS
12.5000 mg | ORAL_TABLET | Freq: Once | ORAL | Status: DC
  Filled 2017-12-01: qty 1

## 2017-12-01 NOTE — ED Triage Notes (Addendum)
Pt reports she has had tachycardia throughout the night. She reports a rate as high as 170. Hx of A-fib. Also reports chest pain and SOB. She took 2 flecainide.

## 2017-12-01 NOTE — ED Provider Notes (Signed)
Emergency Department Provider Note   I have reviewed the triage vital signs and the nursing notes.   HISTORY  Chief Complaint Palpitations   HPI Shannon [name] is a 67 y.o. female history of A. fib with RVR status post ablation on Xarelto and Coreg presents the emergency department today with palpitations.  Patient states that she feels right-sided chest pain that radiates to her neck with sometimes shortness of breath when she has her atrial fibrillation.  The start on 2:00 this morning.  She took a flecainide which seemed to slow down a little bit but did not take it away so she took another one at 6:00 and did not improve so came here for further evaluation.  She has not been sick recently.  She had normal oral intake.  No fevers.  No urinary symptoms.  No lower externally swelling.  No shortness of breath at this time. No other associated or modifying symptoms.    Past Medical History:  Diagnosis Date  . Anxiety   . Arthritis    "neck and lower back" (03/25/2016)  . Asthma 1990s X 1   "short term inhaler use"   . Chronic lower back pain   . Degenerative disorder of bone   . Depression   . Diastolic dysfunction   . Drug-induced lupus erythematosus    HCTZ induced; "still gettin over it" (03/25/2016)  . GERD (gastroesophageal reflux disease)   . Herniated disc, cervical   . Hyperlipidemia   . Hypertension   . Neuromuscular disorder ([place])    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 HTN  . Orthostatic hypotension   . OSA on CPAP   . Osteopenia   . PAF (paroxysmal atrial fibrillation) ([place])   . Pinched nerve in neck   . T12 compression fracture ([place]) 11/2015  . Vitamin D deficiency   . Whiplash injury 06/07/2010    Patient Active Problem List   Diagnosis Date Noted  . Morbid obesity ([place]) 09/11/2017  . Chronic fatigue 05/27/2016  . A-fib ([place]) 03/25/2016  . Typical atrial flutter ([place])   . Chronic diastolic CHF (congestive heart failure) ([place])  12/08/2015  . Thoracic compression fracture ([place]) 11/16/2015  . Orthostatic hypotension 11/15/2015  . Osteopenia 09/12/2015  . Vitamin D deficiency 08/29/2015  . OSA (obstructive sleep apnea) 03/04/2015  . Polymyalgia ([place]) 11/22/2014  . Essential hypertension 08/21/2014  . Anxiety and depression 08/21/2014  . Hip stiffness 08/21/2014    Past Surgical History:  Procedure Laterality Date  . APPENDECTOMY  1990s  . ATRIAL FIBRILLATION ABLATION N/A 03/25/2016   Procedure: Atrial Fibrillation Ablation;  Surgeon: [name], MD;  Location: [facility];  Service: Cardiovascular;  Laterality: N/A;  . FOREARM FRACTURE SURGERY Left ~ 02/2011   "broke arm; shattered wrist"  . FOREARM HARDWARE REMOVAL Right ~ 07/2011  . FRACTURE SURGERY    . TEE WITHOUT CARDIOVERSION N/A 03/24/2016   Procedure: TRANSESOPHAGEAL ECHOCARDIOGRAM (TEE);  Surgeon: Sanda Klein, MD;  Location[facility];  Service: Cardiovascular;  Laterality: N/A;    Current Outpatient Rx  . Order #: [id number] Class: Historical Med  . Order #: [id number] Class: Historical Med  . Order #: [id number] Class: Normal  . Order #: [id number] Class: Normal  . Order #: [id number] Class: Historical Med  . Order #: [id number] Class: Normal  . Order #: [id number] Class: Normal  . Order #: [id number] Class: Normal  . Order #: [id number] Class: Normal  . Order #: [id number] Class: Normal  . Order #: [id number] Class: OTC  . Order #:  [id number] Class: Normal    Allergies Ace inhibitors; Hctz [hydrochlorothiazide]; Oxycodone; Prednisone; Sulfa antibiotics; Sulfur; and Voltaren [diclofenac sodium]  Family History  Problem Relation Age of Onset  . Cancer Mother   . Stroke Father   . Hypertension Father   . Hypertension Maternal Grandmother   . Diabetes Neg Hx     Social History Social History   Tobacco Use  . Smoking status: Former Smoker    Packs/day: 0.50    Years: 44.00    Pack years: 22.00    Types: Cigarettes, E-cigarettes    Last  attempt to quit: 09/06/2013    Years since quitting: 4.2  . Smokeless tobacco: Never Used  Substance Use Topics  . Alcohol use: Yes    Comment: 03/25/2016 "nothing for a couple years now; was having a drink on anniversary and Christmas"  . Drug use: No    Review of Systems  All other systems negative except as documented in the HPI. All pertinent positives and negatives as reviewed in the HPI. ____________________________________________   PHYSICAL EXAM:  VITAL SIGNS: ED Triage Vitals  Enc Vitals Group     BP 12/01/17 0803 128/87     Pulse Rate 12/01/17 0803 97     Resp 12/01/17 0803 20     Temp 12/01/17 0803 98.7 F (37.1 C)     Temp Source 12/01/17 0803 Oral     SpO2 12/01/17 0803 100 %     Weight 12/01/17 0801 210 lb (95.3 kg)     Height 12/01/17 0801 5\' 6"  (1.676 m)     Head Circumference --      Peak Flow --      Pain Score 12/01/17 0801 2     Pain Loc --      Pain Edu? --      Excl. in [place]? --     Constitutional: Alert and oriented. Well appearing and in no acute distress. Eyes: Conjunctivae are normal. PERRL. EOMI. Head: Atraumatic. Nose: No congestion/rhinnorhea. Mouth/Throat: Mucous membranes are moist.  Oropharynx non-erythematous. Neck: No stridor.  No meningeal signs.   Cardiovascular: tachycardic rate, irregular rhythm. Good peripheral circulation. Grossly normal heart sounds.   Respiratory: Normal respiratory effort.  No retractions. Lungs CTAB. Gastrointestinal: Soft and nontender. No distention.  Musculoskeletal: No lower extremity tenderness nor edema. No gross deformities of extremities. Neurologic:  Normal speech and language. No gross focal neurologic deficits are appreciated.  Skin:  Skin is warm, dry and intact. No rash noted.   ____________________________________________   LABS (all labs ordered are listed, but only abnormal results are displayed)  Labs Reviewed  COMPREHENSIVE METABOLIC PANEL - Abnormal; Notable for the following  components:      Result Value   Glucose, Bld 146 (*)    All other components within normal limits  CBC WITH DIFFERENTIAL/PLATELET   ____________________________________________  EKG   EKG Interpretation  Date/Time:  Tuesday December 01 2017 08:06:04 EST Ventricular Rate:  128 PR Interval:    QRS Duration: 95 QT Interval:  348 QTC Calculation: 508 R Axis:   89 Text Interpretation:  Atrial fibrillation Borderline right axis deviation Probable anteroseptal infarct, old Minimal ST depression, lateral leads Prolonged QT interval Afib RVR new since most recent in 09/2016 Confirmed by Merrily Pew [phone]) on 12/01/2017 8:46:38 AM       EKG Interpretation  Date/Time:  Tuesday December 01 2017 08:12:24 EST Ventricular Rate:  59 PR Interval:    QRS Duration: 95 QT Interval:  467 QTC Calculation: 463  R Axis:   101 Text Interpretation:  Normal sinus rhythm Anteroseptal infarct, age indeterminate Baseline wander in lead(s) I III aVR aVL aVF V1 V2 V3 V4 improved afib rvr from earlier in day Confirmed by Merrily Pew [phone] on 12/01/2017 8:47:24 AM        ____________________________________________  RADIOLOGY  No results found.  ____________________________________________   PROCEDURES  Procedure(s) performed:   Procedures  No critical care provided, spontaneously converted after home flecainide. ____________________________________________   INITIAL IMPRESSION / ASSESSMENT AND PLAN / ED COURSE  We discussed in cardioversion patient cardioverted on her own.  Suspect the flecainide finally took effect.  We will give her home medications, check labs and observe for an hour or 2 to make sure she does not return into A. fib.  We will follow-up with cardiology as scheduled. Already on optimum treatment for same.     CHA2DS2/VAS Stroke Risk Points  Current as of 6 minutes ago     4 >= 2 Points: High Risk  1 - 1.99 Points: Medium Risk  0 Points: Low Risk    This is the  only CHA2DS2/VAS Stroke Risk Points available for the past  year.:  Last Change: N/A     Details    This score determines the patient's risk of having a stroke if the  patient has atrial fibrillation.       Points Metrics  1 Has Congestive Heart Failure:  Yes    Current as of 6 minutes ago  0 Has Vascular Disease:  No    Current as of 6 minutes ago  1 Has Hypertension:  Yes    Current as of 6 minutes ago  1 Age:  39    Current as of 6 minutes ago  0 Has Diabetes:  No    Current as of 6 minutes ago  0 Had Stroke:  No  Had TIA:  No  Had thromboembolism:  No    Current as of 6 minutes ago  1 Female:  Yes    Current as of 6 minutes ago      Observed on telemetry for over an hour with no recurrent atrial fibrillation or other arrhythmias.  Patient is symptom-free and chest pain-free will not initiate cardiac work-up.  Patient discharged in sinus rhythm to go home and take her normal morning medications and follow up with cardiology.      Pertinent labs & imaging results that were available during my care of the patient were reviewed by me and considered in my medical decision making (see chart for details).  ____________________________________________  FINAL CLINICAL IMPRESSION(S) / ED DIAGNOSES  Final diagnoses:  Atrial fibrillation with rapid ventricular response ([place])     MEDICATIONS GIVEN DURING THIS VISIT:  Medications  carvedilol (COREG) tablet 12.5 mg (12.5 mg Oral Not Given 12/01/17 0909)     NEW OUTPATIENT MEDICATIONS STARTED DURING THIS VISIT:  New Prescriptions   No medications on file    Note:  This note was prepared with assistance of Dragon voice recognition software. Occasional wrong-word or sound-a-like substitutions may have occurred due to the inherent limitations of voice recognition software.   Merrily Pew, MD 12/01/17 [phone]

## 2017-12-08 ENCOUNTER — Encounter: Payer: Self-pay | Admitting: Physician Assistant

## 2017-12-08 ENCOUNTER — Other Ambulatory Visit: Payer: Self-pay | Admitting: Physician Assistant

## 2017-12-08 ENCOUNTER — Ambulatory Visit (INDEPENDENT_AMBULATORY_CARE_PROVIDER_SITE_OTHER): Payer: Medicare Other | Admitting: Physician Assistant

## 2017-12-08 ENCOUNTER — Other Ambulatory Visit: Payer: Self-pay

## 2017-12-08 VITALS — BP 120/78 | HR 64 | Temp 98.4°F | Resp 14 | Ht 67.0 in | Wt 220.0 lb

## 2017-12-08 DIAGNOSIS — Z23 Encounter for immunization: Secondary | ICD-10-CM | POA: Diagnosis not present

## 2017-12-08 DIAGNOSIS — E538 Deficiency of other specified B group vitamins: Secondary | ICD-10-CM

## 2017-12-08 DIAGNOSIS — M8580 Other specified disorders of bone density and structure, unspecified site: Secondary | ICD-10-CM

## 2017-12-08 DIAGNOSIS — E559 Vitamin D deficiency, unspecified: Secondary | ICD-10-CM | POA: Diagnosis not present

## 2017-12-08 DIAGNOSIS — Z780 Asymptomatic menopausal state: Secondary | ICD-10-CM

## 2017-12-08 DIAGNOSIS — Z1212 Encounter for screening for malignant neoplasm of rectum: Secondary | ICD-10-CM

## 2017-12-08 DIAGNOSIS — E611 Iron deficiency: Secondary | ICD-10-CM

## 2017-12-08 DIAGNOSIS — Z1211 Encounter for screening for malignant neoplasm of colon: Secondary | ICD-10-CM

## 2017-12-08 DIAGNOSIS — D508 Other iron deficiency anemias: Secondary | ICD-10-CM

## 2017-12-08 LAB — VITAMIN D 25 HYDROXY (VIT D DEFICIENCY, FRACTURES): VITD: 58.28 ng/mL (ref 30.00–100.00)

## 2017-12-08 LAB — VITAMIN B12: Vitamin B-12: 937 pg/mL — ABNORMAL HIGH (ref 211–911)

## 2017-12-08 LAB — IRON: IRON: 68 ug/dL (ref 42–145)

## 2017-12-08 NOTE — Patient Instructions (Signed)
Please go to the lab today for blood work.  I will call you with your results. We will alter treatment regimen(s) if indicated by your results.   I have sent in a prescription for the lower dose of Cymbalta. Please start taking daily as directed. Follow-up with me in 3-4 weeks.

## 2017-12-08 NOTE — Progress Notes (Signed)
Patient presents to clinic today to discuss medications. She is currently taking Vit D supplement OTC for osteopenia, Iron supplement and B12 supplement. Wants to know if she still needs these things. Last DEXA > 2 years ago. No hx of pathological fracture. Is requesting labs today.  Past Medical History:  Diagnosis Date  . Anxiety   . Arthritis    "neck and lower back" (03/25/2016)  . Asthma 1990s X 1   "short term inhaler use"   . Chronic lower back pain   . Degenerative disorder of bone   . Depression   . Diastolic dysfunction   . Drug-induced lupus erythematosus    HCTZ induced; "still gettin over it" (03/25/2016)  . GERD (gastroesophageal reflux disease)   . Herniated disc, cervical   . Hyperlipidemia   . Hypertension   . Neuromuscular disorder ([place])    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 HTN  . Orthostatic hypotension   . OSA on CPAP   . Osteopenia   . PAF (paroxysmal atrial fibrillation) ([place])   . Pinched nerve in neck   . T12 compression fracture ([place]) 11/2015  . Vitamin D deficiency   . Whiplash injury 06/07/2010    Current Outpatient Medications on File Prior to Visit  Medication Sig Dispense Refill  . acetaminophen (TYLENOL) 650 MG CR tablet Take 650-1,300 mg by mouth every 8 (eight) hours as needed for pain.     Marland Kitchen amLODipine (NORVASC) 10 MG tablet TAKE 1 TABLET BY MOUTH EVERY DAY (Patient taking differently: 5 mg daily. ) 90 tablet 1  . carvedilol (COREG) 12.5 MG tablet Take 1 tablet (12.5 mg total) by mouth 2 (two) times daily. 180 tablet 3  . cholecalciferol (VITAMIN D) 1000 units tablet Take 1,000 Units by mouth daily.    . cyanocobalamin 500 MCG tablet Take 1 tablet (500 mcg total) by mouth daily. 90 tablet 2  . DULoxetine (CYMBALTA) 60 MG capsule Take 1 capsule (60 mg total) by mouth daily. 30 capsule 0  . ferrous sulfate 325 (65 FE) MG tablet TAKE 1 TABLET BY MOUTH EVERY DAY 90 tablet 2  . flecainide (TAMBOCOR) 50 MG tablet Take 50 mg by  mouth 2 (two) times daily.    Marland Kitchen LORazepam (ATIVAN) 0.5 MG tablet Take 1 tablet (0.5 mg total) by mouth 2 (two) times daily as needed for anxiety. 30 tablet 0  . Multiple Vitamins-Minerals (MULTIVITAMIN) tablet Take 1 tablet by mouth daily. 30 tablet 0  . XARELTO 20 MG TABS tablet TAKE 1 TABLET BY MOUTH EVERY DAY 90 tablet 1   No current facility-administered medications on file prior to visit.     Allergies  Allergen Reactions  . Ace Inhibitors Cough  . Hctz [Hydrochlorothiazide] Other (See Comments)    Caused drug-induced LUPUS  . Oxycodone Other (See Comments)    Hallucinations  . Prednisone Other (See Comments)    Made patient very aggressive  . Sulfa Antibiotics Nausea And Vomiting  . Sulfur Nausea And Vomiting  . Voltaren [Diclofenac Sodium] Other (See Comments)    Made patient become aggressive    Family History  Problem Relation Age of Onset  . Cancer Mother   . Stroke Father   . Hypertension Father   . Hypertension Maternal Grandmother   . Diabetes Neg Hx     Social History   Socioeconomic History  . Marital status: Married    Spouse name: Not on file  . Number of children: Not on file  .  Years of education: Not on file  . Highest education level: Not on file  Occupational History  . Not on file  Social Needs  . Financial resource strain: Not on file  . Food insecurity:    Worry: Not on file    Inability: Not on file  . Transportation needs:    Medical: Not on file    Non-medical: Not on file  Tobacco Use  . Smoking status: Former Smoker    Packs/day: 0.50    Years: 44.00    Pack years: 22.00    Types: Cigarettes, E-cigarettes    Last attempt to quit: 09/06/2013    Years since quitting: 4.2  . Smokeless tobacco: Never Used  Substance and Sexual Activity  . Alcohol use: Yes    Comment: 03/25/2016 "nothing for a couple years now; was having a drink on anniversary and Christmas"  . Drug use: No  . Sexual activity: Not Currently  Lifestyle  . Physical  activity:    Days per week: Not on file    Minutes per session: Not on file  . Stress: Not on file  Relationships  . Social connections:    Talks on phone: Not on file    Gets together: Not on file    Attends religious service: Not on file    Active member of club or organization: Not on file    Attends meetings of clubs or organizations: Not on file    Relationship status: Not on file  Other Topics Concern  . Not on file  Social History Narrative   ** Merged History Encounter **        Review of Systems - See HPI.  All other ROS are negative.  BP 120/78   Pulse 64   Temp 98.4 F (36.9 C) (Oral)   Resp 14   Ht 5\' 7"  (1.702 m)   Wt 220 lb (99.8 kg)   SpO2 98%   BMI 34.46 kg/m   Physical Exam  Constitutional: She is oriented to person, place, and time. She appears well-developed and well-nourished.  HENT:  Head: Normocephalic and atraumatic.  Cardiovascular: Normal rate, regular rhythm, normal heart sounds and intact distal pulses.  Pulmonary/Chest: Effort normal and breath sounds normal.  Neurological: She is alert and oriented to person, place, and time.  Psychiatric: She has a normal mood and affect.  Vitals reviewed.   Recent Results (from the past 2160 hour(s))  CBC with Differential     Status: None   Collection Time: 12/01/17  8:15 AM  Result Value Ref Range   WBC 10.3 4.0 - 10.5 K/uL   RBC 4.52 3.87 - 5.11 MIL/uL   Hemoglobin 13.1 12.0 - 15.0 g/dL   HCT 41.5 36.0 - 46.0 %   MCV 91.8 80.0 - 100.0 fL   MCH 29.0 26.0 - 34.0 pg   MCHC 31.6 30.0 - 36.0 g/dL   RDW 13.3 11.5 - 15.5 %   Platelets 334 150 - 400 K/uL   nRBC 0.0 0.0 - 0.2 %   Neutrophils Relative % 69 %   Neutro Abs 7.1 1.7 - 7.7 K/uL   Lymphocytes Relative 19 %   Lymphs Abs 2.0 0.7 - 4.0 K/uL   Monocytes Relative 8 %   Monocytes Absolute 0.8 0.1 - 1.0 K/uL   Eosinophils Relative 3 %   Eosinophils Absolute 0.3 0.0 - 0.5 K/uL   Basophils Relative 1 %   Basophils Absolute 0.1 0.0 - 0.1 K/uL  Immature Granulocytes 0 %   Abs Immature Granulocytes 0.02 0.00 - 0.07 K/uL    Comment: Performed at [facility], [address] [place], [place] [address]  Comprehensive metabolic panel     Status: Abnormal   Collection Time: 12/01/17  8:15 AM  Result Value Ref Range   Sodium 138 135 - 145 mmol/L   Potassium 3.6 3.5 - 5.1 mmol/L   Chloride 104 98 - 111 mmol/L   CO2 25 22 - 32 mmol/L   Glucose, Bld 146 (H) 70 - 99 mg/dL   BUN 13 8 - 23 mg/dL   Creatinine, Ser 0.74 0.44 - 1.00 mg/dL   Calcium 9.3 8.9 - 10.3 mg/dL   Total Protein 8.0 6.5 - 8.1 g/dL   Albumin 4.1 3.5 - 5.0 g/dL   AST 19 15 - 41 U/L   ALT 19 0 - 44 U/L   Alkaline Phosphatase 123 38 - 126 U/L   Total Bilirubin 0.5 0.3 - 1.2 mg/dL   GFR calc non Af Amer >60 >60 mL/min   GFR calc Af Amer >60 >60 mL/min   Anion gap 9 5 - 15    Comment: Performed at [facility], [address] [place], [place] [address]    Assessment/Plan: 1. Osteopenia, unspecified location Due for repeat bone density scan. Is taking Vit D OTC as directed. Will check Vit D level. Order for DEXA placed. - Vitamin D (25 hydroxy)  2. Iron deficiency - Iron  3. Vitamin D deficiency - Vitamin D (25 hydroxy)  4. B12 deficiency - B12  5. Need for pneumococcal vaccination Pneumovax updated today.  - Pneumococcal polysaccharide vaccine 23-valent greater than or equal to 2yo subcutaneous/IM   Leeanne Rio, PA-C

## 2017-12-09 ENCOUNTER — Encounter: Payer: Self-pay | Admitting: Physician Assistant

## 2017-12-11 ENCOUNTER — Telehealth: Payer: Self-pay | Admitting: Physician Assistant

## 2017-12-11 MED ORDER — DULOXETINE HCL 30 MG PO CPEP: 30.0000 mg | ORAL_CAPSULE | Freq: Every day | ORAL | 3 refills | Status: DC

## 2017-12-11 NOTE — Telephone Encounter (Signed)
Copied from [place] [id number]. Topic: Quick Communication - See Telephone Encounter >> Dec 11, 2017 11:47 AM Ahmed Prima L wrote: CRM for notification. See Telephone encounter for: 12/11/17.  Patient states she was in the office on 12/3 and he was going to send in a new script for DULoxetine (CYMBALTA) 60 MG capsule and was decreasing it to 30 mg. She just checked with the pharmacy and they do not have it. She said she needs it before the end of the day because she only have one pill left.   [id number] - [place] [place] [address] [place] [address]

## 2017-12-11 NOTE — Addendum Note (Signed)
Addended by: Leonidas Romberg on: 12/11/2017 10:02 AM   Modules accepted: Orders

## 2017-12-11 NOTE — Telephone Encounter (Signed)
LMOVM advising patient that rx for Cymbalta 30 mg was sent to the pharmacy

## 2017-12-11 NOTE — Telephone Encounter (Signed)
Medication has been sent in 

## 2017-12-15 ENCOUNTER — Encounter: Payer: Self-pay | Admitting: Physician Assistant

## 2017-12-19 ENCOUNTER — Other Ambulatory Visit: Payer: Self-pay | Admitting: Physician Assistant

## 2018-01-02 ENCOUNTER — Other Ambulatory Visit: Payer: Self-pay | Admitting: Physician Assistant

## 2018-01-10 DIAGNOSIS — Z1211 Encounter for screening for malignant neoplasm of colon: Secondary | ICD-10-CM | POA: Diagnosis not present

## 2018-01-10 DIAGNOSIS — Z1212 Encounter for screening for malignant neoplasm of rectum: Secondary | ICD-10-CM | POA: Diagnosis not present

## 2018-01-10 LAB — COLOGUARD

## 2018-01-11 ENCOUNTER — Ambulatory Visit (INDEPENDENT_AMBULATORY_CARE_PROVIDER_SITE_OTHER): Payer: Medicare Other | Admitting: Psychology

## 2018-01-11 DIAGNOSIS — F331 Major depressive disorder, recurrent, moderate: Secondary | ICD-10-CM

## 2018-01-13 ENCOUNTER — Ambulatory Visit (INDEPENDENT_AMBULATORY_CARE_PROVIDER_SITE_OTHER): Payer: Medicare Other | Admitting: Internal Medicine

## 2018-01-13 ENCOUNTER — Encounter: Payer: Self-pay | Admitting: Internal Medicine

## 2018-01-13 VITALS — BP 130/72 | HR 63 | Ht 67.0 in | Wt 222.0 lb

## 2018-01-13 DIAGNOSIS — I480 Paroxysmal atrial fibrillation: Secondary | ICD-10-CM

## 2018-01-13 DIAGNOSIS — I10 Essential (primary) hypertension: Secondary | ICD-10-CM | POA: Diagnosis not present

## 2018-01-13 MED ORDER — FLECAINIDE ACETATE 50 MG PO TABS: 50.0000 mg | ORAL_TABLET | Freq: Two times a day (BID) | ORAL | 3 refills | Status: DC

## 2018-01-13 NOTE — Patient Instructions (Addendum)
Medication Instructions:  Your physician has recommended you make the following change in your medication:  1.  Start taking Flecainide 50 mg---Take one tablet by mouth twice a day.  Labwork: None ordered.  Testing/Procedures: You will follow up with a nurse visit for a 12 lead EKG in 2-3 weeks for new start flecainide.  Follow-Up: Your physician wants you to follow-up in: 3 months with Dr. Lovena Le.     Any Other Special Instructions Will Be Listed Below (If Applicable).  If you need a refill on your cardiac medications before your next appointment, please call your pharmacy.

## 2018-01-13 NOTE — Progress Notes (Signed)
HPI [name] returns today for ongoing evaluation and management of atrial fib and bradycardia. She is s/p EPS and RFA of atrial fib about a year ago. In the interim, she has done well execpt for some back problems. She denies chest pain or sob. No syncope. She is limited by her back. When she bends over she has bendopnea. She thinks that her heart has gone out of rhythm 5-6 times in the past 6 months. The last episode she took 150 mg of flecainide and finally went back to NSR after presenting to the ED. Allergies  Allergen Reactions  . Ace Inhibitors Cough  . Hctz [Hydrochlorothiazide] Other (See Comments)    Caused drug-induced LUPUS  . Oxycodone Other (See Comments)    Hallucinations  . Prednisone Other (See Comments)    Made patient very aggressive  . Sulfa Antibiotics Nausea And Vomiting  . Sulfur Nausea And Vomiting  . Voltaren [Diclofenac Sodium] Other (See Comments)    Made patient become aggressive     Current Outpatient Medications  Medication Sig Dispense Refill  . acetaminophen (TYLENOL) 650 MG CR tablet Take 650-1,300 mg by mouth every 8 (eight) hours as needed for pain.     Marland Kitchen amLODipine (NORVASC) 10 MG tablet TAKE 1 TABLET BY MOUTH EVERY DAY 90 tablet 1  . carvedilol (COREG) 12.5 MG tablet Take 1 tablet (12.5 mg total) by mouth 2 (two) times daily. 180 tablet 3  . cholecalciferol (VITAMIN D) 1000 units tablet Take 1,000 Units by mouth daily.    . DULoxetine (CYMBALTA) 30 MG capsule TAKE 1 CAPSULE BY MOUTH EVERY DAY 90 capsule 1  . LORazepam (ATIVAN) 0.5 MG tablet Take 1 tablet (0.5 mg total) by mouth 2 (two) times daily as needed for anxiety. 30 tablet 0  . XARELTO 20 MG TABS tablet TAKE 1 TABLET BY MOUTH EVERY DAY 90 tablet 1  . flecainide (TAMBOCOR) 50 MG tablet Take 1 tablet (50 mg total) by mouth 2 (two) times daily. 180 tablet 3   No current facility-administered medications for this visit.      Past Medical History:  Diagnosis Date  . Anxiety   .  Arthritis    "neck and lower back" (03/25/2016)  . Asthma 1990s X 1   "short term inhaler use"   . Chronic lower back pain   . Degenerative disorder of bone   . Depression   . Diastolic dysfunction   . Drug-induced lupus erythematosus    HCTZ induced; "still gettin over it" (03/25/2016)  . GERD (gastroesophageal reflux disease)   . Herniated disc, cervical   . Hyperlipidemia   . Hypertension   . Neuromuscular disorder ([place])    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 HTN  . Orthostatic hypotension   . OSA on CPAP   . Osteopenia   . PAF (paroxysmal atrial fibrillation) ([place])   . Pinched nerve in neck   . T12 compression fracture ([place]) 11/2015  . Vitamin D deficiency   . Whiplash injury 06/07/2010    ROS:   All systems reviewed and negative except as noted in the HPI.   Past Surgical History:  Procedure Laterality Date  . APPENDECTOMY  1990s  . ATRIAL FIBRILLATION ABLATION N/A 03/25/2016   Procedure: Atrial Fibrillation Ablation;  Surgeon: [name], MD;  Location: [facility];  Service: Cardiovascular;  Laterality: N/A;  . FOREARM FRACTURE SURGERY Left ~ 02/2011   "broke arm; shattered wrist"  . FOREARM  HARDWARE REMOVAL Right ~ 07/2011  . FRACTURE SURGERY    . TEE WITHOUT CARDIOVERSION N/A 03/24/2016   Procedure: TRANSESOPHAGEAL ECHOCARDIOGRAM (TEE);  Surgeon: Sanda Klein, MD;  Location: [facility] ENDOSCOPY;  Service: Cardiovascular;  Laterality: N/A;     Family History  Problem Relation Age of Onset  . Cancer Mother   . Stroke Father   . Hypertension Father   . Hypertension Maternal Grandmother   . Diabetes Neg Hx      Social History   Socioeconomic History  . Marital status: Married    Spouse name: Not on file  . Number of children: Not on file  . Years of education: Not on file  . Highest education level: Not on file  Occupational History  . Not on file  Social Needs  . Financial resource strain: Not on file  . Food insecurity:     Worry: Not on file    Inability: Not on file  . Transportation needs:    Medical: Not on file    Non-medical: Not on file  Tobacco Use  . Smoking status: Former Smoker    Packs/day: 0.50    Years: 44.00    Pack years: 22.00    Types: Cigarettes, E-cigarettes    Last attempt to quit: 09/06/2013    Years since quitting: 4.3  . Smokeless tobacco: Never Used  Substance and Sexual Activity  . Alcohol use: Yes    Comment: 03/25/2016 "nothing for a couple years now; was having a drink on anniversary and Christmas"  . Drug use: No  . Sexual activity: Not Currently  Lifestyle  . Physical activity:    Days per week: Not on file    Minutes per session: Not on file  . Stress: Not on file  Relationships  . Social connections:    Talks on phone: Not on file    Gets together: Not on file    Attends religious service: Not on file    Active member of club or organization: Not on file    Attends meetings of clubs or organizations: Not on file    Relationship status: Not on file  . Intimate partner violence:    Fear of current or ex partner: Not on file    Emotionally abused: Not on file    Physically abused: Not on file    Forced sexual activity: Not on file  Other Topics Concern  . Not on file  Social History Narrative   ** Merged History Encounter **         BP 130/72   Pulse 63   Ht 5\' 7"  (1.702 m)   Wt 222 lb (100.7 kg)   SpO2 98%   BMI 34.77 kg/m   Physical Exam:  Well appearing overweight woman, NAD HEENT: Unremarkable Neck:  6 cm JVD, no thyromegally Lymphatics:  No adenopathy Back:  No CVA tenderness Lungs:  Clear with no wheezes HEART:  Regular rate rhythm, no murmurs, no rubs, no clicks Abd:  soft, positive bowel sounds, no organomegally, no rebound, no guarding Ext:  2 plus pulses, no edema, no cyanosis, no clubbing Skin:  No rashes no nodules Neuro:  CN II through XII intact, motor grossly intact  EKG - none   Assess/Plan: 1. PAF - I have discussed her  treatment options in detail. I have recommended that she start flecainide 50 mg bid with an additional dose or two as needed for any breakthrough arrhythmias. 2. HTN - her blood pressure is well controlled.  Mikle Bosworth.D.

## 2018-01-15 LAB — COLOGUARD: Cologuard: POSITIVE — AB

## 2018-01-20 ENCOUNTER — Other Ambulatory Visit: Payer: Self-pay | Admitting: Internal Medicine

## 2018-01-20 NOTE — Telephone Encounter (Signed)
Xarelto 20mg  refill request received; pt is 68 yrs old, wt-100.7kg, Crea-0.74 on 12/01/2017, last seen by Dr. Lovena Le on 01/13/2018, CrCl-118.34ml/min. Will send in refill to requested pharmacy.

## 2018-01-22 ENCOUNTER — Other Ambulatory Visit (INDEPENDENT_AMBULATORY_CARE_PROVIDER_SITE_OTHER): Payer: Medicare Other

## 2018-01-22 ENCOUNTER — Other Ambulatory Visit (HOSPITAL_COMMUNITY): Payer: Self-pay | Admitting: Internal Medicine

## 2018-01-22 ENCOUNTER — Telehealth: Payer: Self-pay

## 2018-01-22 ENCOUNTER — Other Ambulatory Visit: Payer: Self-pay

## 2018-01-22 DIAGNOSIS — E611 Iron deficiency: Secondary | ICD-10-CM | POA: Diagnosis not present

## 2018-01-22 DIAGNOSIS — E538 Deficiency of other specified B group vitamins: Secondary | ICD-10-CM | POA: Diagnosis not present

## 2018-01-22 DIAGNOSIS — R195 Other fecal abnormalities: Secondary | ICD-10-CM

## 2018-01-22 DIAGNOSIS — E559 Vitamin D deficiency, unspecified: Secondary | ICD-10-CM | POA: Diagnosis not present

## 2018-01-22 LAB — VITAMIN D 25 HYDROXY (VIT D DEFICIENCY, FRACTURES): VITD: 56.4 ng/mL (ref 30.00–100.00)

## 2018-01-22 LAB — IRON: Iron: 69 ug/dL (ref 42–145)

## 2018-01-22 LAB — VITAMIN B12: Vitamin B-12: 459 pg/mL (ref 211–911)

## 2018-01-22 NOTE — Telephone Encounter (Signed)
LM requesting call back. Cologuard test positive, per PCP, GI referral ordered.

## 2018-01-25 NOTE — Telephone Encounter (Signed)
Spoke with patient regarding positive Cologuard. Explained GI referral and Cologuard accuracy rate. Patient verbalized understanding and plans to schedule colonoscopy ASAP.

## 2018-01-26 ENCOUNTER — Encounter: Payer: Self-pay | Admitting: Physician Assistant

## 2018-01-30 ENCOUNTER — Other Ambulatory Visit: Payer: Self-pay | Admitting: Internal Medicine

## 2018-02-01 ENCOUNTER — Ambulatory Visit (INDEPENDENT_AMBULATORY_CARE_PROVIDER_SITE_OTHER): Payer: Medicare Other

## 2018-02-01 VITALS — BP 163/79 | HR 73

## 2018-02-01 DIAGNOSIS — I480 Paroxysmal atrial fibrillation: Secondary | ICD-10-CM | POA: Diagnosis not present

## 2018-02-01 NOTE — Patient Instructions (Signed)
Medication Instructions:  Your physician recommends that you continue on your current medications as directed. Please refer to the Current Medication list given to you today.  Labwork: None ordered.  Testing/Procedures: None ordered.  Follow-Up: Your physician wants you to follow-up in: as scheduled.    Any Other Special Instructions Will Be Listed Below (If Applicable).  If you need a refill on your cardiac medications before your next appointment, please call your pharmacy.   

## 2018-02-01 NOTE — Telephone Encounter (Signed)
Pt last saw Dr Lovena Le 01/13/18, last labs 12/01/17 Creat 0.74, age 68, weight 100.7kg, CrCl 118.88, based on CrCl pt is on appropriate dosage of Xarelto 20mg  QD.  Will refill rx.

## 2018-02-01 NOTE — Progress Notes (Signed)
1.) Reason for visit: Initiation of Flecainide 50 mg BID   2.) Name of MD requesting visit: Dr. Lovena Le  3.) H&P: Pt started flecainide 50 mg BID 01/13/2018; states she feels well, had 2 brief episodes where she felt a "twinge" in her heart but was gone before she could focus on it.    4.) ROS related to problem: Pt reports she is tolerating medication well.  Does complain of some fatigue.  She thinks fatigue started after she was discovered to have afib and started on a "bunch of different medications"  5.) Assessment and plan per MD: EKG reviewed by DOD.  No action at this time.  Pt has follow up appointment scheduled.

## 2018-02-03 ENCOUNTER — Encounter: Payer: Self-pay | Admitting: Physician Assistant

## 2018-02-03 ENCOUNTER — Ambulatory Visit (INDEPENDENT_AMBULATORY_CARE_PROVIDER_SITE_OTHER): Payer: Medicare Other | Admitting: Physician Assistant

## 2018-02-03 ENCOUNTER — Telehealth: Payer: Self-pay

## 2018-02-03 VITALS — BP 116/60 | HR 56 | Ht 66.0 in | Wt 220.2 lb

## 2018-02-03 DIAGNOSIS — Z7901 Long term (current) use of anticoagulants: Secondary | ICD-10-CM

## 2018-02-03 DIAGNOSIS — I4811 Longstanding persistent atrial fibrillation: Secondary | ICD-10-CM | POA: Diagnosis not present

## 2018-02-03 DIAGNOSIS — R195 Other fecal abnormalities: Secondary | ICD-10-CM | POA: Diagnosis not present

## 2018-02-03 MED ORDER — NA SULFATE-K SULFATE-MG SULF 17.5-3.13-1.6 GM/177ML PO SOLN: ORAL | 0 refills | Status: DC

## 2018-02-03 NOTE — Patient Instructions (Signed)
If you are age 67 or older, your body mass index should be between 23-30. Your Body mass index is 35.55 kg/m. If this is out of the aforementioned range listed, please consider follow up with your Primary Care Provider.  If you are age 40 or younger, your body mass index should be between 19-25. Your Body mass index is 35.55 kg/m. If this is out of the aformentioned range listed, please consider follow up with your Primary Care Provider.   You have been scheduled for a colonoscopy. Please follow written instructions given to you at your visit today.  Please pick up your prep supplies at the pharmacy within the next 1-3 days. If you use inhalers (even only as needed), please bring them with you on the day of your procedure. Your physician has requested that you go to www.startemmi.com and enter the access code given to you at your visit today. This web site gives a general overview about your procedure. However, you should still follow specific instructions given to you by our office regarding your preparation for the procedure.  We have sent the following medications to your pharmacy for you to pick up at your convenience: [place] will be contacted by our office prior to your procedure for directions on holding your Xarelto.  If you do not hear from our office 1 week prior to your scheduled procedure, please call [phone] to discuss.   Thank you for choosing me and [facility].    Ellouise Newer, PA-C

## 2018-02-03 NOTE — Progress Notes (Signed)
Chief Complaint: Positive Cologuard test  HPI:    Shannon [name] is a 68 year old Caucasian female with a past medical history as listed below including A. fib on Xarelto and CHF (TEE 03/24/2016 with LVEF 55-60%), who was referred to me by Brunetta Jeans, PA-C for a positive Cologuard test.     Today, the patient presents clinic accompanied by her husband and explains that she had a positive Cologuard test.  She is not exactly sure what this means.  Explains that she has had a previous colonoscopy but it has been "years ago" and that one was "normal".  She is not sure where this was done or who did it.  Does tell me that she was on Iron for about 6 months and had some GI issues with constipation and some diarrhea, came off of this in December and is still "getting back to normal".  Tells me typically she has a high-fiber diet and this helps keep her regular.    Denies fever, chills, weight loss, nausea, vomiting, heartburn, reflux, change in bowel habits or blood in the stool.       Past Medical History:  Diagnosis Date  . Anxiety   . Arthritis    "neck and lower back" (03/25/2016)  . Asthma 1990s X 1   "short term inhaler use"   . Chronic lower back pain   . Degenerative disorder of bone   . Depression   . Diastolic dysfunction   . Drug-induced lupus erythematosus    HCTZ induced; "still gettin over it" (03/25/2016)  . GERD (gastroesophageal reflux disease)   . Herniated disc, cervical   . Hyperlipidemia   . Hypertension   . Neuromuscular disorder ([place])    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 HTN  . Orthostatic hypotension   . OSA on CPAP   . Osteopenia   . PAF (paroxysmal atrial fibrillation) ([place])   . Pinched nerve in neck   . T12 compression fracture ([place]) 11/2015  . Vitamin D deficiency   . Whiplash injury 06/07/2010    Past Surgical History:  Procedure Laterality Date  . APPENDECTOMY  1990s  . ATRIAL FIBRILLATION ABLATION N/A 03/25/2016   Procedure:  Atrial Fibrillation Ablation;  Surgeon: [name], MD;  Location: [place];  Service: Cardiovascular;  Laterality: N/A;  . FOREARM FRACTURE SURGERY Left ~ 02/2011   "broke arm; shattered wrist"  . FOREARM HARDWARE REMOVAL Right ~ 07/2011  . FRACTURE SURGERY    . TEE WITHOUT CARDIOVERSION N/A 03/24/2016   Procedure: TRANSESOPHAGEAL ECHOCARDIOGRAM (TEE);  Surgeon: Sanda Klein, MD;  Location: [facility] ENDOSCOPY;  Service: Cardiovascular;  Laterality: N/A;    Current Outpatient Medications  Medication Sig Dispense Refill  . acetaminophen (TYLENOL) 650 MG CR tablet Take 650-1,300 mg by mouth every 8 (eight) hours as needed for pain.     Marland Kitchen amLODipine (NORVASC) 10 MG tablet TAKE 1 TABLET BY MOUTH EVERY DAY 90 tablet 1  . carvedilol (COREG) 12.5 MG tablet TAKE 1 TABLET (12.5 MG TOTAL) BY MOUTH 2 (TWO) TIMES DAILY. 180 tablet 3  . cholecalciferol (VITAMIN D) 1000 units tablet Take 1,000 Units by mouth daily.    . DULoxetine (CYMBALTA) 30 MG capsule TAKE 1 CAPSULE BY MOUTH EVERY DAY 90 capsule 1  . flecainide (TAMBOCOR) 50 MG tablet Take 1 tablet (50 mg total) by mouth 2 (two) times daily. 180 tablet 3  . LORazepam (ATIVAN) 0.5 MG tablet Take 1 tablet (0.5 mg total) by mouth  2 (two) times daily as needed for anxiety. 30 tablet 0  . XARELTO 20 MG TABS tablet TAKE 1 TABLET BY MOUTH EVERY DAY 90 tablet 2   No current facility-administered medications for this visit.     Allergies as of 02/03/2018 - Review Complete 02/01/2018  Allergen Reaction Noted  . Ace inhibitors Cough 11/15/2015  . Hctz [hydrochlorothiazide] Other (See Comments) 11/15/2015  . Oxycodone Other (See Comments) 11/15/2015  . Prednisone Other (See Comments) 04/03/2016  . Sulfa antibiotics Nausea And Vomiting 11/15/2015  . Sulfur Nausea And Vomiting 08/14/2014  . Voltaren [diclofenac sodium] Other (See Comments) 11/22/2014    Family History  Problem Relation Age of Onset  . Cancer Mother   . Stroke Father   .  Hypertension Father   . Hypertension Maternal Grandmother   . Diabetes Neg Hx     Social History   Socioeconomic History  . Marital status: Married    Spouse name: Not on file  . Number of children: Not on file  . Years of education: Not on file  . Highest education level: Not on file  Occupational History  . Not on file  Social Needs  . Financial resource strain: Not on file  . Food insecurity:    Worry: Not on file    Inability: Not on file  . Transportation needs:    Medical: Not on file    Non-medical: Not on file  Tobacco Use  . Smoking status: Former Smoker    Packs/day: 0.50    Years: 44.00    Pack years: 22.00    Types: Cigarettes, E-cigarettes    Last attempt to quit: 09/06/2013    Years since quitting: 4.4  . Smokeless tobacco: Never Used  Substance and Sexual Activity  . Alcohol use: Yes    Comment: 03/25/2016 "nothing for a couple years now; was having a drink on anniversary and Christmas"  . Drug use: No  . Sexual activity: Not Currently  Lifestyle  . Physical activity:    Days per week: Not on file    Minutes per session: Not on file  . Stress: Not on file  Relationships  . Social connections:    Talks on phone: Not on file    Gets together: Not on file    Attends religious service: Not on file    Active member of club or organization: Not on file    Attends meetings of clubs or organizations: Not on file    Relationship status: Not on file  . Intimate partner violence:    Fear of current or ex partner: Not on file    Emotionally abused: Not on file    Physically abused: Not on file    Forced sexual activity: Not on file  Other Topics Concern  . Not on file  Social History Narrative   ** Merged History Encounter **        Review of Systems:    Constitutional: No weight loss, fever or chills Skin: No rash Cardiovascular: No chest pain Respiratory: No SOB  Gastrointestinal: See HPI and otherwise negative Genitourinary: No dysuria    Neurological: No headache, dizziness or syncope Musculoskeletal: No new muscle or joint pain Hematologic: No bleeding  Psychiatric: No history of depression or anxiety   Physical Exam:  Vital signs: BP 116/60 (BP Location: Left Arm, Patient Position: Sitting, Cuff Size: Normal)   Pulse (!) 56   Ht 5\' 6"  (1.676 m) Comment: height measured without shoes  Wt 220 lb  4 oz (99.9 kg)   BMI 35.55 kg/m   Constitutional:   Pleasant overweight Caucasian female appears to be in NAD, Well developed, Well nourished, alert and cooperative Respiratory: Respirations even and unlabored. Lungs clear to auscultation bilaterally.   No wheezes, crackles, or rhonchi.  Cardiovascular: Normal S1, S2. No MRG. Regular rate and rhythm. No peripheral edema, cyanosis or pallor.  Gastrointestinal:  Soft, nondistended, nontender. No rebound or guarding. Normal bowel sounds. No appreciable masses or hepatomegaly. Rectal:  Not performed.  Psychiatric: Demonstrates good judgement and reason without abnormal affect or behaviors.  RELEVANT LABS AND IMAGING: CBC    Component Value Date/Time   WBC 10.3 12/01/2017 0815   RBC 4.52 12/01/2017 0815   HGB 13.1 12/01/2017 0815   HCT 41.5 12/01/2017 0815   PLT 334 12/01/2017 0815   MCV 91.8 12/01/2017 0815   MCH 29.0 12/01/2017 0815   MCHC 31.6 12/01/2017 0815   RDW 13.3 12/01/2017 0815   LYMPHSABS 2.0 12/01/2017 0815   MONOABS 0.8 12/01/2017 0815   EOSABS 0.3 12/01/2017 0815   BASOSABS 0.1 12/01/2017 0815    CMP     Component Value Date/Time   NA 138 12/01/2017 0815   NA 142 01/09/2016 1155   K 3.6 12/01/2017 0815   CL 104 12/01/2017 0815   CO2 25 12/01/2017 0815   GLUCOSE 146 (H) 12/01/2017 0815   BUN 13 12/01/2017 0815   BUN 9 01/09/2016 1155   CREATININE 0.74 12/01/2017 0815   CALCIUM 9.3 12/01/2017 0815   PROT 8.0 12/01/2017 0815   ALBUMIN 4.1 12/01/2017 0815   AST 19 12/01/2017 0815   ALT 19 12/01/2017 0815   ALKPHOS 123 12/01/2017 0815   BILITOT  0.5 12/01/2017 0815   GFRNONAA >60 12/01/2017 0815   GFRAA >60 12/01/2017 0815    Assessment: 1.  Positive Cologuard: 01/10/2018, last colon "years ago", normal per pt 2.  Chronic anticoagulation: For A. fib with Xarelto  Plan: 1.  Scheduled patient for a diagnostic colonoscopy in the [place] with Dr. Havery Moros given positive Cologuard testing recently.  Did discuss risks, benefits, limitations and alternatives and the patient agrees to proceed. 2.  Patient was advised to hold her Xarelto for 2 days prior to time of procedure.  We will communicate with her prescribing physician to ensure that holding her Xarelto is acceptable for her. 3.  Patient to follow in clinic per recommendations from Dr. Havery Moros after time of procedure.  Ellouise Newer, PA-C [facility] 02/03/2018, 10:29 AM  Cc: Brunetta Jeans, PA-C

## 2018-02-03 NOTE — Telephone Encounter (Signed)
[facility] Pre-operative Risk Assessment     Request for surgical clearance:     Endoscopy Procedure  What type of surgery is being performed?     Colonoscopy  When is this surgery scheduled?     02/23/18  What type of clearance is required ?   Pharmacy  Are there any medications that need to be held prior to surgery and how long? HOLD XARELTO FOR 2 DAYS PRIOR  Practice name and name of physician performing surgery?      [facility]/Dr. Havery Moros  What is your office phone and fax number?      Phone- [phone]  [phone]  Anesthesia type (None, local, MAC, general) ?       MAC

## 2018-02-04 ENCOUNTER — Ambulatory Visit (INDEPENDENT_AMBULATORY_CARE_PROVIDER_SITE_OTHER): Payer: Medicare Other | Admitting: Psychology

## 2018-02-04 DIAGNOSIS — F331 Major depressive disorder, recurrent, moderate: Secondary | ICD-10-CM | POA: Diagnosis not present

## 2018-02-04 NOTE — Progress Notes (Signed)
Agree with assessment and plan as outlined.  

## 2018-02-05 ENCOUNTER — Telehealth: Payer: Self-pay

## 2018-02-05 NOTE — Telephone Encounter (Signed)
The pt filled out a Office manager pt asst application for Xarelto. She included her 2018 proof of income (not 2019) and no out of pocket expense report from her pharmacy.  Dr Lovena Le has signed the application and I have faxed it to [facility] and [place].

## 2018-02-09 ENCOUNTER — Telehealth (HOSPITAL_COMMUNITY): Payer: Self-pay | Admitting: *Deleted

## 2018-02-09 NOTE — Telephone Encounter (Signed)
Patient with diagnosis of afib on Xarelto for anticoagulation.    Procedure: Colonoscopy Date of procedure: 02/23/2018  CHADS2-VASc score of  4 (CHF, HTN, AGE, DM2, stroke/tia x 2, CAD, AGE, female)  Per office protocol, patient can hold xarelto for 2 days prior to procedure.

## 2018-02-09 NOTE — Telephone Encounter (Signed)
Patient called in stating yesterday she had a really bad day not feeling well. BP was dropping under 742 systolic she had no change in her HR in the 70s but had all the other symptoms of "afib coming."  She decided not to take flecainide yesterday and feels much better today. Wondering if she should continue flecainide or not - -discussed with Roderic Palau NP - will try flecainide again today if symptoms return patient will call back to discussion of different medications - pt in agreement.

## 2018-02-10 ENCOUNTER — Encounter (HOSPITAL_COMMUNITY): Payer: Self-pay | Admitting: Nurse Practitioner

## 2018-02-10 ENCOUNTER — Ambulatory Visit (HOSPITAL_COMMUNITY)
Admission: RE | Admit: 2018-02-10 | Discharge: 2018-02-10 | Disposition: A | Discharge status: Home / Self Care | Payer: Medicare Other | Source: Ambulatory Visit | Attending: Nurse Practitioner | Admitting: Nurse Practitioner

## 2018-02-10 VITALS — BP 128/72 | HR 60 | Ht 66.0 in | Wt 221.0 lb

## 2018-02-10 DIAGNOSIS — I2510 Atherosclerotic heart disease of native coronary artery without angina pectoris: Secondary | ICD-10-CM | POA: Diagnosis not present

## 2018-02-10 DIAGNOSIS — Z801 Family history of malignant neoplasm of trachea, bronchus and lung: Secondary | ICD-10-CM | POA: Diagnosis not present

## 2018-02-10 DIAGNOSIS — Z7901 Long term (current) use of anticoagulants: Secondary | ICD-10-CM | POA: Insufficient documentation

## 2018-02-10 DIAGNOSIS — I10 Essential (primary) hypertension: Secondary | ICD-10-CM | POA: Insufficient documentation

## 2018-02-10 DIAGNOSIS — F419 Anxiety disorder, unspecified: Secondary | ICD-10-CM | POA: Insufficient documentation

## 2018-02-10 DIAGNOSIS — Z79899 Other long term (current) drug therapy: Secondary | ICD-10-CM | POA: Diagnosis not present

## 2018-02-10 DIAGNOSIS — Z87891 Personal history of nicotine dependence: Secondary | ICD-10-CM | POA: Insufficient documentation

## 2018-02-10 DIAGNOSIS — Z8249 Family history of ischemic heart disease and other diseases of the circulatory system: Secondary | ICD-10-CM | POA: Diagnosis not present

## 2018-02-10 DIAGNOSIS — I4819 Other persistent atrial fibrillation: Secondary | ICD-10-CM | POA: Diagnosis not present

## 2018-02-10 DIAGNOSIS — Z885 Allergy status to narcotic agent status: Secondary | ICD-10-CM | POA: Diagnosis not present

## 2018-02-10 DIAGNOSIS — I4892 Unspecified atrial flutter: Secondary | ICD-10-CM | POA: Insufficient documentation

## 2018-02-10 DIAGNOSIS — Z882 Allergy status to sulfonamides status: Secondary | ICD-10-CM | POA: Diagnosis not present

## 2018-02-10 DIAGNOSIS — Z833 Family history of diabetes mellitus: Secondary | ICD-10-CM | POA: Insufficient documentation

## 2018-02-10 DIAGNOSIS — Z886 Allergy status to analgesic agent status: Secondary | ICD-10-CM | POA: Insufficient documentation

## 2018-02-10 DIAGNOSIS — E559 Vitamin D deficiency, unspecified: Secondary | ICD-10-CM | POA: Insufficient documentation

## 2018-02-10 DIAGNOSIS — G4733 Obstructive sleep apnea (adult) (pediatric): Secondary | ICD-10-CM | POA: Diagnosis not present

## 2018-02-10 DIAGNOSIS — F329 Major depressive disorder, single episode, unspecified: Secondary | ICD-10-CM | POA: Insufficient documentation

## 2018-02-10 DIAGNOSIS — Z888 Allergy status to other drugs, medicaments and biological substances status: Secondary | ICD-10-CM | POA: Insufficient documentation

## 2018-02-10 DIAGNOSIS — I480 Paroxysmal atrial fibrillation: Secondary | ICD-10-CM

## 2018-02-10 DIAGNOSIS — R9431 Abnormal electrocardiogram [ECG] [EKG]: Secondary | ICD-10-CM | POA: Insufficient documentation

## 2018-02-10 MED ORDER — FLECAINIDE ACETATE 50 MG PO TABS: ORAL_TABLET | ORAL | 3 refills | Status: DC

## 2018-02-10 NOTE — Telephone Encounter (Signed)
Dr. Lovena Le Pt for colonoscopy, I see she was just placed on flecainide - is it ok for her to proceed or hold for a moth.  Scheduled for 02/23/18  thanks

## 2018-02-10 NOTE — Progress Notes (Signed)
Primary Care Physician: Brunetta Jeans, PA-C Referring Physician:Dr. Allred Primary Electrophysiologist: Dr Harland German Shannon [name] is a 68 y.o. female with a h/o afib s/p ablation 03/26/16 by Dr. Rayann Heman, in the [facility] for f/u. She did go to the ER 3/21 for rt groin pain, with no significant findings. She had been doing well with "pill-in-the-pocket" flecainide but she noted more afib episodes in the last 6 months. She presented to the ER on 12/01/17 with afib and spontaneously converted. Her flecainide was increased to 50 mg BID at her visit with Dr Lovena Le. She has not had further afib but she does report dizziness and fatigue with the daily flecainide. She had a trial off the medication for a day and she felt back to normal. Intervals on ECG stable.   Today, she denies symptoms of chest pain, shortness of breath, orthopnea, PND, lower extremity edema, presyncope, syncope, or neurologic sequela.  Past Medical History:  Diagnosis Date  . Anemia   . Anxiety   . Arthritis    "neck and lower back" (03/25/2016)  . Asthma 1990s X 1   "short term inhaler use"   . CAD (coronary artery disease)   . Chronic lower back pain   . Degenerative disorder of bone   . Depression   . Diastolic dysfunction   . Drug-induced lupus erythematosus    HCTZ induced; "still gettin over it" (03/25/2016)  . GERD (gastroesophageal reflux disease)   . Herniated disc, cervical   . Hyperlipidemia   . Hypertension   . Neuromuscular disorder ([place])    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 HTN  . Orthostatic hypotension   . OSA on CPAP   . Osteopenia   . PAF (paroxysmal atrial fibrillation) ([place])   . Pinched nerve in neck   . T12 compression fracture ([place]) 11/2015  . Vitamin D deficiency   . Whiplash injury 06/07/2010   Past Surgical History:  Procedure Laterality Date  . APPENDECTOMY  1990s  . ATRIAL FIBRILLATION ABLATION N/A 03/25/2016   Procedure: Atrial Fibrillation Ablation;   Surgeon: [name], MD;  Location: [facility];  Service: Cardiovascular;  Laterality: N/A;  . FOREARM FRACTURE SURGERY Left ~ 02/2011   "broke arm; shattered wrist"  . FOREARM HARDWARE REMOVAL Left ~ 07/2011  . Spinal Nerve Ablation    . TEE WITHOUT CARDIOVERSION N/A 03/24/2016   Procedure: TRANSESOPHAGEAL ECHOCARDIOGRAM (TEE);  Surgeon: Sanda Klein, MD;  Location: [facility] ENDOSCOPY;  Service: Cardiovascular;  Laterality: N/A;    Current Outpatient Medications  Medication Sig Dispense Refill  . acetaminophen (TYLENOL) 650 MG CR tablet Take 650-1,300 mg by mouth every 8 (eight) hours as needed for pain.     Marland Kitchen amLODipine (NORVASC) 10 MG tablet TAKE 1 TABLET BY MOUTH EVERY DAY 90 tablet 1  . carvedilol (COREG) 12.5 MG tablet TAKE 1 TABLET (12.5 MG TOTAL) BY MOUTH 2 (TWO) TIMES DAILY. 180 tablet 3  . cholecalciferol (VITAMIN D) 1000 units tablet Take 1,000 Units by mouth daily.    . DULoxetine (CYMBALTA) 30 MG capsule TAKE 1 CAPSULE BY MOUTH EVERY DAY 90 capsule 1  . LORazepam (ATIVAN) 0.5 MG tablet Take 1 tablet (0.5 mg total) by mouth 2 (two) times daily as needed for anxiety. 30 tablet 0  . XARELTO 20 MG TABS tablet TAKE 1 TABLET BY MOUTH EVERY DAY 90 tablet 2  . flecainide (TAMBOCOR) 50 MG tablet Take 2 tablets by mouth daily as needed for breakthrough  afib. 180 tablet 3  . Na Sulfate-K Sulfate-Mg Sulf 17.5-3.13-1.6 GM/177ML SOLN Suprep-Use as directed (Patient not taking: Reported on 02/10/2018) 354 mL 0   No current facility-administered medications for this encounter.     Allergies  Allergen Reactions  . Ace Inhibitors Cough  . Hctz [Hydrochlorothiazide] Other (See Comments)    Caused drug-induced LUPUS  . Oxycodone Other (See Comments)    Hallucinations  . Prednisone Other (See Comments)    Made patient very aggressive  . Sulfa Antibiotics Nausea And Vomiting  . Sulfur Nausea And Vomiting  . Voltaren [Diclofenac Sodium] Other (See Comments)    Made patient become  aggressive    Social History   Socioeconomic History  . Marital status: Married    Spouse name: Not on file  . Number of children: 2  . Years of education: Not on file  . Highest education level: Not on file  Occupational History  . Occupation: retired  Scientific laboratory technician  . Financial resource strain: Not on file  . Food insecurity:    Worry: Not on file    Inability: Not on file  . Transportation needs:    Medical: Not on file    Non-medical: Not on file  Tobacco Use  . Smoking status: Former Smoker    Packs/day: 0.50    Years: 44.00    Pack years: 22.00    Types: Cigarettes, E-cigarettes    Last attempt to quit: 09/06/2013    Years since quitting: 4.4  . Smokeless tobacco: Never Used  Substance and Sexual Activity  . Alcohol use: Yes    Comment: 03/25/2016 "nothing for a couple years now; was having a drink on anniversary and Christmas"  . Drug use: No  . Sexual activity: Not Currently  Lifestyle  . Physical activity:    Days per week: Not on file    Minutes per session: Not on file  . Stress: Not on file  Relationships  . Social connections:    Talks on phone: Not on file    Gets together: Not on file    Attends religious service: Not on file    Active member of club or organization: Not on file    Attends meetings of clubs or organizations: Not on file    Relationship status: Not on file  . Intimate partner violence:    Fear of current or ex partner: Not on file    Emotionally abused: Not on file    Physically abused: Not on file    Forced sexual activity: Not on file  Other Topics Concern  . Not on file  Social History Narrative   ** Merged History Encounter **        Family History  Problem Relation Age of Onset  . Lung cancer Mother   . Stroke Father   . Hypertension Father   . Heart disease Father   . Hypertension Maternal Grandmother   . Stroke Maternal Grandfather   . Heart disease Maternal Grandfather   . Diabetes Paternal Grandmother   . Heart  disease Paternal Grandmother   . Diabetes Paternal Grandfather   . Bipolar disorder Daughter   . Other Daughter        fatty liver  . Other Son        fattye liver, born with 1 kidney    ROS- All systems are reviewed and negative except as per the HPI above  Physical Exam: Vitals:   02/10/18 1529  BP: 128/72  Pulse: 60  Weight: 100.2 kg  Height: 5\' 6"  (1.676 m)   Wt Readings from Last 3 Encounters:  02/10/18 100.2 kg  02/03/18 99.9 kg  01/13/18 100.7 kg    Labs: Lab Results  Component Value Date   NA 138 12/01/2017   K 3.6 12/01/2017   CL 104 12/01/2017   CO2 25 12/01/2017   GLUCOSE 146 (H) 12/01/2017   BUN 13 12/01/2017   CREATININE 0.74 12/01/2017   CALCIUM 9.3 12/01/2017   PHOS 4.6 11/26/2015   MG 2.1 12/08/2015   Lab Results  Component Value Date   INR 1.22 04/03/2016   Lab Results  Component Value Date   CHOL 189 12/08/2015   HDL 48 12/08/2015   LDLCALC 118 (H) 12/08/2015   TRIG 116 12/08/2015     GEN- The patient is well appearing, alert and oriented x 3 today.   HEENT-head normocephalic, atraumatic, sclera clear, conjunctiva pink, hearing intact, trachea midline. Lungs- Clear to ausculation bilaterally, normal work of breathing Heart- Regular rate and rhythm, no murmurs, rubs or gallops  GI- soft, NT, ND, + BS Extremities- no clubbing, cyanosis, or edema MS- no significant deformity or atrophy Skin- no rash or lesion Psych- euthymic mood, full affect Neuro- strength and sensation are intact   EKG- SR HR 60, old sept infarct, PR 176, QRS 84, QTc 448  TEE 03/24/16 - Left ventricle: There was mild concentric hypertrophy. Systolic   function was normal. The estimated ejection fraction was in the   range of 55% to 60%. Wall motion was normal; there were no   regional wall motion abnormalities. There was a reduced   contribution of atrial contraction to ventricular filling, due to   increased ventricular diastolic pressure or atrial  contractile   dysfunction. - Aortic valve: There was trivial regurgitation. - Mitral valve: There was mild to moderate regurgitation directed   centrally. - Left atrium: The atrium was mildly to moderately dilated. No   evidence of thrombus in the atrial cavity or appendage. - Right atrium: No evidence of thrombus in the atrial cavity or   appendage.  Assessment and Plan: 1. Persistent afib/flutter S/p afib.flutter ablation with Dr Rayann Heman 03/25/16.  She has been on daily flecainide for the last month but has developed intolerable side effects including dizziness and fatigue. Will change flecainide back to 100 mg PRN. Will refer to Dr Rayann Heman for consideration for repeat ablation.  This patients CHA2DS2-VASc Score and unadjusted Ischemic Stroke Rate (% per year) is equal to 4.8 % stroke rate/year from a score of 4  Above score calculated as 1 point each if present [CHF, HTN, DM, Vascular=MI/PAD/Aortic Plaque, Age if 65-74, or Female] Above score calculated as 2 points each if present [Age > 75, or Stroke/TIA/TE]   2. HTN Stable, no changes today.  3. Positive Cologuard test Plans per PCP and GI. Colonoscopy scheduled later this month.  Follow up with Dr Rayann Heman for evaluation for ablation. Follow up with Dr Lovena Le as scheduled.  [facility] [address] [place], [place] [address] [phone]

## 2018-02-12 ENCOUNTER — Telehealth: Payer: Self-pay

## 2018-02-12 NOTE — Telephone Encounter (Addendum)
error 

## 2018-02-12 NOTE — Telephone Encounter (Signed)
Patient advised okay to HOLD Xarelto 2 days prior to procedure. Patient verbalized understanding.

## 2018-02-15 NOTE — Telephone Encounter (Signed)
I can think of no reason to hold flecainide. GT

## 2018-02-16 NOTE — Telephone Encounter (Signed)
See recommendation below by clinical pharmacist, please forward to requesting provider and also instruct patient to hold Xarelto for 2 days prior to her colonoscopy

## 2018-02-17 NOTE — Telephone Encounter (Signed)
Patient returned call and was advised of recommendations.

## 2018-02-17 NOTE — Telephone Encounter (Signed)
Note routed to Dr Doyne Keel office via [employer]. Left message advising patient to call back to review medication recommendations.

## 2018-02-23 ENCOUNTER — Encounter: Payer: Self-pay | Admitting: Gastroenterology

## 2018-02-23 ENCOUNTER — Ambulatory Visit (AMBULATORY_SURGERY_CENTER): Payer: Medicare Other | Admitting: Gastroenterology

## 2018-02-23 VITALS — BP 141/78 | HR 62 | Temp 97.3°F | Resp 12 | Ht 66.0 in | Wt 220.0 lb

## 2018-02-23 DIAGNOSIS — D122 Benign neoplasm of ascending colon: Secondary | ICD-10-CM

## 2018-02-23 DIAGNOSIS — G4733 Obstructive sleep apnea (adult) (pediatric): Secondary | ICD-10-CM | POA: Diagnosis not present

## 2018-02-23 DIAGNOSIS — I10 Essential (primary) hypertension: Secondary | ICD-10-CM | POA: Diagnosis not present

## 2018-02-23 DIAGNOSIS — D124 Benign neoplasm of descending colon: Secondary | ICD-10-CM | POA: Diagnosis not present

## 2018-02-23 DIAGNOSIS — D127 Benign neoplasm of rectosigmoid junction: Secondary | ICD-10-CM

## 2018-02-23 DIAGNOSIS — I2510 Atherosclerotic heart disease of native coronary artery without angina pectoris: Secondary | ICD-10-CM | POA: Diagnosis not present

## 2018-02-23 DIAGNOSIS — D120 Benign neoplasm of cecum: Secondary | ICD-10-CM | POA: Diagnosis not present

## 2018-02-23 DIAGNOSIS — R195 Other fecal abnormalities: Secondary | ICD-10-CM | POA: Diagnosis not present

## 2018-02-23 DIAGNOSIS — K621 Rectal polyp: Secondary | ICD-10-CM | POA: Diagnosis not present

## 2018-02-23 DIAGNOSIS — D128 Benign neoplasm of rectum: Secondary | ICD-10-CM

## 2018-02-23 MED ORDER — SODIUM CHLORIDE 0.9 % IV SOLN: 500.0000 mL | Freq: Once | INTRAVENOUS | Status: DC

## 2018-02-23 NOTE — Patient Instructions (Signed)
Please read handouts provided. Await pathology results. Continue present medications. Resume Xarelto tomorrow ( 24 hours ).      YOU HAD AN ENDOSCOPIC PROCEDURE TODAY AT [facility]:   Refer to the procedure report that was given to you for any specific questions about what was found during the examination.  If the procedure report does not answer your questions, please call your gastroenterologist to clarify.  If you requested that your care partner not be given the details of your procedure findings, then the procedure report has been included in a sealed envelope for you to review at your convenience later.  YOU SHOULD EXPECT: Some feelings of bloating in the abdomen. Passage of more gas than usual.  Walking can help get rid of the air that was put into your GI tract during the procedure and reduce the bloating. If you had a lower endoscopy (such as a colonoscopy or flexible sigmoidoscopy) you may notice spotting of blood in your stool or on the toilet paper. If you underwent a bowel prep for your procedure, you may not have a normal bowel movement for a few days.  Please Note:  You might notice some irritation and congestion in your nose or some drainage.  This is from the oxygen used during your procedure.  There is no need for concern and it should clear up in a day or so.  SYMPTOMS TO REPORT IMMEDIATELY:   Following lower endoscopy (colonoscopy or flexible sigmoidoscopy):  Excessive amounts of blood in the stool  Significant tenderness or worsening of abdominal pains  Swelling of the abdomen that is new, acute  Fever of 100F or higher    For urgent or emergent issues, a gastroenterologist can be reached at any hour by calling [phone].   DIET:  We do recommend a small meal at first, but then you may proceed to your regular diet.  Drink plenty of fluids but you should avoid alcoholic beverages for 24 hours.  ACTIVITY:  You should plan to take it easy for  the rest of today and you should NOT DRIVE or use heavy machinery until tomorrow (because of the sedation medicines used during the test).    FOLLOW UP: Our staff will call the number listed on your records the next business day following your procedure to check on you and address any questions or concerns that you may have regarding the information given to you following your procedure. If we do not reach you, we will leave a message.  However, if you are feeling well and you are not experiencing any problems, there is no need to return our call.  We will assume that you have returned to your regular daily activities without incident.  If any biopsies were taken you will be contacted by phone or by letter within the next 1-3 weeks.  Please call us at [phone] if you have not heard about the biopsies in 3 weeks.    SIGNATURES/CONFIDENTIALITY: You and/or your care partner have signed paperwork which will be entered into your electronic medical record.  These signatures attest to the fact that that the information above on your After Visit Summary has been reviewed and is understood.  Full responsibility of the confidentiality of this discharge information lies with you and/or your care-partner.

## 2018-02-23 NOTE — Op Note (Addendum)
[facility] Patient Name: Shannon [name] Procedure Date: 02/23/2018 2:06 PM MRN: [id number] Endoscopist: Remo Lipps P. Havery Moros , MD Age: 68 Referring MD:  Date of Birth: July 24, 1950 Gender: Female Account #: [id number] Procedure:                Colonoscopy Indications:              Positive Cologuard test Medicines:                Monitored Anesthesia Care Procedure:                Pre-Anesthesia Assessment:                           - Prior to the procedure, a History and Physical                            was performed, and patient medications and                            allergies were reviewed. The patient's tolerance of                            previous anesthesia was also reviewed. The risks                            and benefits of the procedure and the sedation                            options and risks were discussed with the patient.                            All questions were answered, and informed consent                            was obtained. Prior Anticoagulants: The patient has                            taken Xarelto (rivaroxaban), last dose was 2 days                            prior to procedure. ASA Grade Assessment: III - A                            patient with severe systemic disease. After                            reviewing the risks and benefits, the patient was                            deemed in satisfactory condition to undergo the                            procedure.  After obtaining informed consent, the colonoscope                            was passed under direct vision. Throughout the                            procedure, the patient's blood pressure, pulse, and                            oxygen saturations were monitored continuously. The                            Model PCF-[id number]) scope was introduced                            through the anus and advanced to the the terminal                 ileum, with identification of the appendiceal                            orifice and IC valve. The colonoscopy was performed                            without difficulty. The patient tolerated the                            procedure well. The quality of the bowel                            preparation was adequate. The terminal ileum,                            ileocecal valve, appendiceal orifice, and rectum                            were photographed. Scope In: 2:16:00 PM Scope Out: 2:47:56 PM Scope Withdrawal Time: 0 hours 21 minutes 59 seconds  Total Procedure Duration: 0 hours 31 minutes 56 seconds  Findings:                 The perianal and digital rectal examinations were                            normal.                           The terminal ileum appeared normal.                           A 3 mm polyp was found in the cecum. The polyp was                            sessile. The polyp was removed with a cold snare.  Resection and retrieval were complete.                           Three sessile polyps were found in the ascending                            colon. The polyps were 3 to 4 mm in size. These                            polyps were removed with a cold snare. Resection                            and retrieval were complete.                           A 3 mm polyp was found in the descending colon. The                            polyp was sessile. The polyp was removed with a                            cold snare. Resection and retrieval were complete.                           A 8 mm polyp was found in the recto-sigmoid colon.                            The polyp was pedunculated. The polyp was removed                            with a hot snare. Resection and retrieval were                            complete.                           A 4 mm polyp was found in the recto-sigmoid colon.                            The polyp was  sessile. The polyp was removed with a                            cold snare. Resection and retrieval were complete.                           Multiple small polyps in the rectum were noted,                            most likely hyperplastic. The two largest were                            removed as a representative sample. The polyps were  3 to 4 mm in size. These polyps were removed with a                            cold snare. Resection and retrieval were complete.                           There was spasm in the entire colon.                           The exam was otherwise without abnormality. Complications:            No immediate complications. Estimated blood loss:                            Minimal. Estimated Blood Loss:     Estimated blood loss was minimal. Impression:               - The examined portion of the ileum was normal.                           - One 3 mm polyp in the cecum, removed with a cold                            snare. Resected and retrieved.                           - Three 3 to 4 mm polyps in the ascending colon,                            removed with a cold snare. Resected and retrieved.                           - One 3 mm polyp in the descending colon, removed                            with a cold snare. Resected and retrieved.                           - One 8 mm polyp at the recto-sigmoid colon,                            removed with a hot snare. Resected and retrieved.                           - One 4 mm polyp at the recto-sigmoid colon,                            removed with a cold snare. Resected and retrieved.                           - Two 3 to 4 mm polyps in the rectum, removed with  a cold snare. Resected and retrieved.                           - Colonic spasm.                           - The examination was otherwise normal. Recommendation:           - Patient has a contact number  available for                            emergencies. The signs and symptoms of potential                            delayed complications were discussed with the                            patient. Return to normal activities tomorrow.                            Written discharge instructions were provided to the                            patient.                           - Resume previous diet.                           - Continue present medications.                           - Resume Xarelto tomorrow (24 hours)                           - Await pathology results. Remo Lipps P. Havery Moros, MD 02/23/2018 2:54:47 PM This report has been signed electronically.

## 2018-02-23 NOTE — Progress Notes (Signed)
Report given to PACU, vss 

## 2018-02-23 NOTE — Progress Notes (Signed)
Called to room to assist during endoscopic procedure.  Patient ID and intended procedure confirmed with present staff. Received instructions for my participation in the procedure from the performing physician.  

## 2018-02-24 ENCOUNTER — Telehealth: Payer: Self-pay | Admitting: *Deleted

## 2018-02-24 NOTE — Telephone Encounter (Signed)
  Follow up Call-  Call back number 02/23/2018  Post procedure Call Back phone  # [phone]  Permission to leave phone message Yes  Some recent data might be hidden     Patient questions:  Do you have a fever, pain , or abdominal swelling? No. Pain Score  0 *  Have you tolerated food without any problems? Yes.    Have you been able to return to your normal activities? Yes.    Do you have any questions about your discharge instructions: Diet   No. Medications  No. Follow up visit  No.  Do you have questions or concerns about your Care? No.  Actions: * If pain score is 4 or above: No action needed, pain <4.

## 2018-03-04 ENCOUNTER — Ambulatory Visit: Payer: Self-pay | Admitting: Internal Medicine

## 2018-03-09 ENCOUNTER — Ambulatory Visit (INDEPENDENT_AMBULATORY_CARE_PROVIDER_SITE_OTHER): Payer: Medicare Other | Admitting: Psychology

## 2018-03-09 DIAGNOSIS — F331 Major depressive disorder, recurrent, moderate: Secondary | ICD-10-CM

## 2018-03-09 NOTE — Telephone Encounter (Signed)
**Note De-Identified Angy Swearengin Obfuscation** Letter received Shannon [name] fax from [place] and [place] stating that they have denied the pt asst for her Xarelto. Reason: The pt does not meet the programs eligibility requirements at this time as the she has insurance coverage for Xarelto.  The letter states that they have contacted the pt as well.

## 2018-03-10 ENCOUNTER — Ambulatory Visit (INDEPENDENT_AMBULATORY_CARE_PROVIDER_SITE_OTHER): Payer: Medicare Other | Admitting: Internal Medicine

## 2018-03-10 ENCOUNTER — Encounter: Payer: Self-pay | Admitting: Internal Medicine

## 2018-03-10 VITALS — BP 132/80 | HR 61 | Ht 66.0 in | Wt 221.4 lb

## 2018-03-10 DIAGNOSIS — I10 Essential (primary) hypertension: Secondary | ICD-10-CM

## 2018-03-10 DIAGNOSIS — I480 Paroxysmal atrial fibrillation: Secondary | ICD-10-CM | POA: Diagnosis not present

## 2018-03-10 NOTE — Progress Notes (Signed)
PCP: Brunetta Jeans, PA-C   Primary EP: Dr Harland German Shannon [name] is a 68 y.o. female who presents today for routine electrophysiology followup.  Since last being seen in our clinic, the patient reports doing very well.  She reports increasing afib for which she was on flecainide.  She stopped flecainide in January and interestingly, feels that she has not had afib since.  She is pleased wth current health state.  Today, she denies symptoms of palpitations, chest pain, shortness of breath,  lower extremity edema, dizziness, presyncope, or syncope.  The patient is otherwise without complaint today.   Past Medical History:  Diagnosis Date  . Allergy   . Anemia   . Anxiety   . Arthritis    "neck and lower back" (03/25/2016)  . Asthma 1990s X 1   "short term inhaler use"   . CAD (coronary artery disease)   . Chronic lower back pain   . Degenerative disorder of bone   . Depression   . Diastolic dysfunction   . Drug-induced lupus erythematosus    HCTZ induced; "still gettin over it" (03/25/2016)  . GERD (gastroesophageal reflux disease)   . Herniated disc, cervical   . Hyperlipidemia   . Hypertension   . Neuromuscular disorder ([place])    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 HTN  . Orthostatic hypotension   . OSA on CPAP   . Osteopenia   . PAF (paroxysmal atrial fibrillation) ([place])   . Pinched nerve in neck   . Sleep apnea    wears CPAP  . T12 compression fracture ([place]) 11/2015  . Vitamin D deficiency   . Whiplash injury 06/07/2010   Past Surgical History:  Procedure Laterality Date  . APPENDECTOMY  1990s  . ATRIAL FIBRILLATION ABLATION N/A 03/25/2016   Procedure: Atrial Fibrillation Ablation;  Surgeon: [name], MD;  Location: [facility];  Service: Cardiovascular;  Laterality: N/A;  . COLONOSCOPY    . FOREARM FRACTURE SURGERY Left ~ 02/2011   "broke arm; shattered wrist"  . FOREARM HARDWARE REMOVAL Left ~ 07/2011  . Spinal Nerve Ablation      . TEE WITHOUT CARDIOVERSION N/A 03/24/2016   Procedure: TRANSESOPHAGEAL ECHOCARDIOGRAM (TEE);  Surgeon: Sanda Klein, MD;  Location: [facility] ENDOSCOPY;  Service: Cardiovascular;  Laterality: N/A;    ROS- all systems are reviewed and negatives except as per HPI above  Current Outpatient Medications  Medication Sig Dispense Refill  . acetaminophen (TYLENOL) 650 MG CR tablet Take 650-1,300 mg by mouth every 8 (eight) hours as needed for pain.     Marland Kitchen amLODipine (NORVASC) 10 MG tablet TAKE 1 TABLET BY MOUTH EVERY DAY 90 tablet 1  . carvedilol (COREG) 12.5 MG tablet TAKE 1 TABLET (12.5 MG TOTAL) BY MOUTH 2 (TWO) TIMES DAILY. 180 tablet 3  . cholecalciferol (VITAMIN D) 1000 units tablet Take 1,000 Units by mouth daily.    . DULoxetine (CYMBALTA) 30 MG capsule TAKE 1 CAPSULE BY MOUTH EVERY DAY 90 capsule 1  . flecainide (TAMBOCOR) 50 MG tablet Take 2 tablets by mouth daily as needed for breakthrough afib. 180 tablet 3  . LORazepam (ATIVAN) 0.5 MG tablet Take 1 tablet (0.5 mg total) by mouth 2 (two) times daily as needed for anxiety. 30 tablet 0  . XARELTO 20 MG TABS tablet TAKE 1 TABLET BY MOUTH EVERY DAY 90 tablet 2   No current facility-administered medications for this visit.     Physical Exam: Vitals:  03/10/18 1549  BP: 132/80  Pulse: 61  SpO2: 93%  Weight: 221 lb 6.4 oz (100.4 kg)  Height: 5\' 6"  (1.676 m)    GEN- The patient is well appearing, alert and oriented x 3 today.   Head- normocephalic, atraumatic Eyes-  Sclera clear, conjunctiva pink Ears- hearing intact Oropharynx- clear Lungs- Clear to ausculation bilaterally, normal work of breathing Heart- Regular rate and rhythm, no murmurs, rubs or gallops, PMI not laterally displaced GI- soft, NT, ND, + BS Extremities- no clubbing, cyanosis, or edema  Wt Readings from Last 3 Encounters:  03/10/18 221 lb 6.4 oz (100.4 kg)  02/23/18 220 lb (99.8 kg)  02/10/18 221 lb (100.2 kg)    EKG tracing ordered today is personally  reviewed and shows sinus rhythm  Assessment and Plan:  1. Paroxysmal atrial fibrillation Currently improved She wishes to defer ablation chads2vasc score is 4. She is on anticoagulation  2. HTN Stable No change required today  3. OSA She is compliant with CPAP  4. Obesity Body mass index is 35.73 kg/m. Lifestyle modification was discussed at length today  Return in 3 months  [name] MD, [facility] 03/10/2018 4:22 PM

## 2018-03-10 NOTE — Patient Instructions (Addendum)
Medication Instructions:  Your physician recommends that you continue on your current medications as directed. Please refer to the Current Medication list given to you today.  Labwork: None ordered.  Testing/Procedures: None ordered.  Follow-Up: Your physician wants you to follow-up in: 3 months with Dr. Allred.      Any Other Special Instructions Will Be Listed Below (If Applicable).  If you need a refill on your cardiac medications before your next appointment, please call your pharmacy.   

## 2018-04-08 ENCOUNTER — Telehealth: Payer: Self-pay | Admitting: Internal Medicine

## 2018-04-08 NOTE — Telephone Encounter (Signed)
New message   [facility] for pt to call back regarding appt on 04.08.20 with Dr. Lovena Le. Will ask pt to move Dr. Lovena Le appt out 6 months, saw Allred in March and has f/u scheduled for 06.26.20.

## 2018-04-12 ENCOUNTER — Ambulatory Visit: Payer: 59 | Admitting: Psychology

## 2018-04-13 NOTE — Telephone Encounter (Signed)
Complete

## 2018-04-14 ENCOUNTER — Ambulatory Visit: Payer: Medicare Other | Admitting: Internal Medicine

## 2018-04-29 ENCOUNTER — Telehealth: Payer: Self-pay

## 2018-04-29 NOTE — Telephone Encounter (Signed)
LM requesting call back to schedule AWV. 

## 2018-05-03 ENCOUNTER — Encounter: Payer: Self-pay | Admitting: Emergency Medicine

## 2018-06-15 ENCOUNTER — Other Ambulatory Visit: Payer: Self-pay | Admitting: Physician Assistant

## 2018-06-27 ENCOUNTER — Other Ambulatory Visit: Payer: Self-pay | Admitting: Physician Assistant

## 2018-06-28 NOTE — Telephone Encounter (Signed)
Patient is due for follow up visit for Mood. She is also due for Medicare Wellness Visit.

## 2018-06-29 ENCOUNTER — Encounter: Payer: Self-pay | Admitting: Emergency Medicine

## 2018-06-30 ENCOUNTER — Encounter: Payer: Self-pay | Admitting: Physician Assistant

## 2018-06-30 ENCOUNTER — Ambulatory Visit (INDEPENDENT_AMBULATORY_CARE_PROVIDER_SITE_OTHER): Payer: Medicare Other | Admitting: Physician Assistant

## 2018-06-30 ENCOUNTER — Other Ambulatory Visit: Payer: Self-pay

## 2018-06-30 VITALS — BP 133/82 | HR 67 | Temp 96.8°F | Resp 16 | Ht 67.0 in | Wt 215.0 lb

## 2018-06-30 DIAGNOSIS — I4811 Longstanding persistent atrial fibrillation: Secondary | ICD-10-CM

## 2018-06-30 DIAGNOSIS — I10 Essential (primary) hypertension: Secondary | ICD-10-CM

## 2018-06-30 DIAGNOSIS — F329 Major depressive disorder, single episode, unspecified: Secondary | ICD-10-CM

## 2018-06-30 DIAGNOSIS — I5032 Chronic diastolic (congestive) heart failure: Secondary | ICD-10-CM | POA: Diagnosis not present

## 2018-06-30 DIAGNOSIS — G4733 Obstructive sleep apnea (adult) (pediatric): Secondary | ICD-10-CM | POA: Diagnosis not present

## 2018-06-30 DIAGNOSIS — F419 Anxiety disorder, unspecified: Secondary | ICD-10-CM | POA: Diagnosis not present

## 2018-06-30 NOTE — Progress Notes (Signed)
I have discussed the procedure for the virtual visit with the patient who has given consent to proceed with assessment and treatment.   Myriam Brandhorst S Bernis Stecher, CMA     

## 2018-06-30 NOTE — Progress Notes (Signed)
Virtual Visit via Video   I connected with patient on 06/30/18 at  2:00 PM EDT by a video enabled telemedicine application and verified that I am speaking with the correct person using two identifiers.  Location patient: Home Location provider: [place], Office Persons participating in the virtual visit: Patient, Provider, PA-Student Anibal Henderson), CMA (Eduard Clos)  I discussed the limitations of evaluation and management by telemedicine and the availability of in person appointments. The patient expressed understanding and agreed to proceed.  Subjective:   HPI:   Patient presents today via Doxy.Me today to follow-up on chronic medical issues.   Hypertension, Atrial Fibrillation, CHF, OSA -- Is followed by PCP and Cardiology. Is currently on a regimen of Carvedilol 12.5 mg BID, Norvasc 10 mg QD, Tambocor 100 mg PRN, Carelto 20 mg QD. Endorses taking medications as directed. Patient denies chest pain, palpitations, lightheadedness, dizziness, vision changes or frequent headaches. Notes no issue with her afib since Jan 1st. Has follow up with cardiology Friday  Anxiety and Depression - Is currently on a regimen of Cymbalta 20 mg Qd and Ativan PRN. Endorses taking as directed. Notes doing extremely well overall. She does note she had a few days of anxiety (move, covid). Denies Si/HI.   ROS:   See pertinent positives and negatives per HPI.  Patient Active Problem List   Diagnosis Date Noted  . Morbid obesity ([place]) 09/11/2017  . Chronic fatigue 05/27/2016  . A-fib ([place]) 03/25/2016  . Typical atrial flutter ([place])   . Chronic diastolic CHF (congestive heart failure) ([place]) 12/08/2015  . Thoracic compression fracture ([place]) 11/16/2015  . Orthostatic hypotension 11/15/2015  . Osteopenia 09/12/2015  . Vitamin D deficiency 08/29/2015  . OSA (obstructive sleep apnea) 03/04/2015  . Polymyalgia ([place]) 11/22/2014  . Essential hypertension 08/21/2014  . Anxiety and depression  08/21/2014  . Hip stiffness 08/21/2014    Social History   Tobacco Use  . Smoking status: Former Smoker    Packs/day: 0.50    Years: 44.00    Pack years: 22.00    Types: Cigarettes, E-cigarettes    Quit date: 09/06/2013    Years since quitting: 4.8  . Smokeless tobacco: Never Used  Substance Use Topics  . Alcohol use: Yes    Comment: 03/25/2016 "nothing for a couple years now; was having a drink on anniversary and Christmas"    Current Outpatient Medications:  .  acetaminophen (TYLENOL) 650 MG CR tablet, Take 650-1,300 mg by mouth every 8 (eight) hours as needed for pain. , Disp: , Rfl:  .  amLODipine (NORVASC) 10 MG tablet, TAKE 1 TABLET BY MOUTH EVERY DAY, Disp: 90 tablet, Rfl: 1 .  carvedilol (COREG) 12.5 MG tablet, TAKE 1 TABLET (12.5 MG TOTAL) BY MOUTH 2 (TWO) TIMES DAILY., Disp: 180 tablet, Rfl: 3 .  cholecalciferol (VITAMIN D) 1000 units tablet, Take 1,000 Units by mouth daily., Disp: , Rfl:  .  DULoxetine (CYMBALTA) 30 MG capsule, Take 1 capsule (30 mg total) by mouth daily. ** Do NOT chew, crush, or open capsule ** Please schedule a follow up appointment for more refills., Disp: 90 capsule, Rfl: 0 .  flecainide (TAMBOCOR) 50 MG tablet, Take 2 tablets by mouth daily as needed for breakthrough afib., Disp: 180 tablet, Rfl: 3 .  LORazepam (ATIVAN) 0.5 MG tablet, Take 1 tablet (0.5 mg total) by mouth 2 (two) times daily as needed for anxiety., Disp: 30 tablet, Rfl: 0 .  XARELTO 20 MG TABS tablet, TAKE 1 TABLET BY MOUTH  EVERY DAY, Disp: 90 tablet, Rfl: 2  Allergies  Allergen Reactions  . Ace Inhibitors Cough  . Hctz [Hydrochlorothiazide] Other (See Comments)    Caused drug-induced LUPUS  . Oxycodone Other (See Comments)    Hallucinations  . Prednisone Other (See Comments)    Made patient very aggressive  . Sulfa Antibiotics Nausea And Vomiting  . Sulfur Nausea And Vomiting  . Voltaren [Diclofenac Sodium] Other (See Comments)    Made patient become aggressive    Objective:    BP 133/82   Pulse 67   Temp (!) 96.8 F (36 C) (Tympanic)   Resp 16   Ht 5\' 7"  (1.702 m)   Wt 215 lb (97.5 kg)   SpO2 93%   BMI 33.67 kg/m   Patient is well-developed, well-nourished in no acute distress.  Resting comfortably in desk chair at home.  Head is normocephalic, atraumatic.  No labored breathing.  Speech is clear and coherent with logical contest.  Patient is alert and oriented at baseline.   Assessment and Plan:   1. Essential hypertension 2. Longstanding persistent atrial fibrillation 3. Chronic diastolic CHF (congestive heart failure) ([place]) 4. OSA (obstructive sleep apnea) BP stable. Doing well overall on current regimen. Wearing CPAP at night. No signs of volume overload on limited examination. Continue current regimen. Follow-up with Cardiology Friday as scheduled.   5. Anxiety and depression Stable at present. Will continue current regimen.   Patient is to schedule a CPE within the next 2 months as she is overdue for wellness exam and labs.  Leeanne Rio, [place] 06/30/2018

## 2018-07-01 ENCOUNTER — Telehealth: Payer: Self-pay

## 2018-07-01 NOTE — Telephone Encounter (Signed)
Spoke with pt regarding appt on 07/02/18. Pt stated she will check vitals prior to appt and did not have any questions at this time.

## 2018-07-02 ENCOUNTER — Telehealth (INDEPENDENT_AMBULATORY_CARE_PROVIDER_SITE_OTHER): Payer: Medicare Other | Admitting: Internal Medicine

## 2018-07-02 ENCOUNTER — Encounter: Payer: Self-pay | Admitting: Internal Medicine

## 2018-07-02 VITALS — BP 133/77 | HR 71 | Ht 67.0 in | Wt 215.0 lb

## 2018-07-02 DIAGNOSIS — I10 Essential (primary) hypertension: Secondary | ICD-10-CM | POA: Diagnosis not present

## 2018-07-02 DIAGNOSIS — I480 Paroxysmal atrial fibrillation: Secondary | ICD-10-CM

## 2018-07-02 DIAGNOSIS — G4733 Obstructive sleep apnea (adult) (pediatric): Secondary | ICD-10-CM

## 2018-07-02 NOTE — Progress Notes (Signed)
Electrophysiology TeleHealth Note   Due to national recommendations of social distancing due to COVID 19, an audio/video telehealth visit is felt to be most appropriate for this patient at this time.  See MyChart message from today for the patient's consent to telehealth for [facility].    Date:  07/02/2018   ID:  Shannon [name], DOB [birth year], MRN [id number]  Location: patient's home  Provider location:  [facility]  Evaluation Performed: Follow-up visit  PCP:  Brunetta Jeans, PA-C   Electrophysiologist:  Dr Rayann Heman  Chief Complaint:  palpitations  History of Present Illness:    Shannon [name] is a 68 y.o. female who presents via telehealth conferencing today.  Since last being seen in our clinic, the patient reports doing very well.  She is pleased that she has not have any afib since January.  She thinks that her stress is better.  Today, she denies symptoms of palpitations, chest pain, shortness of breath,  lower extremity edema, dizziness, presyncope, or syncope.  The patient is otherwise without complaint today.  The patient denies symptoms of fevers, chills, cough, or new SOB worrisome for COVID 19.  Past Medical History:  Diagnosis Date  . Allergy   . Anemia   . Anxiety   . Arthritis    "neck and lower back" (03/25/2016)  . Asthma 1990s X 1   "short term inhaler use"   . CAD (coronary artery disease)   . Chronic lower back pain   . Degenerative disorder of bone   . Depression   . Diastolic dysfunction   . Drug-induced lupus erythematosus    HCTZ induced; "still gettin over it" (03/25/2016)  . GERD (gastroesophageal reflux disease)   . Herniated disc, cervical   . Hyperlipidemia   . Hypertension   . Neuromuscular disorder ([place])    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 HTN  . Orthostatic hypotension   . OSA on CPAP   . Osteopenia   . PAF (paroxysmal atrial fibrillation) ([place])   . Pinched nerve in neck   . Sleep apnea    wears CPAP  . T12 compression fracture ([place]) 11/2015  . Vitamin D deficiency   . Whiplash injury 06/07/2010    Past Surgical History:  Procedure Laterality Date  . APPENDECTOMY  1990s  . ATRIAL FIBRILLATION ABLATION N/A 03/25/2016   Procedure: Atrial Fibrillation Ablation;  Surgeon: [name], MD;  Location: [place] [facility];  Service: Cardiovascular;  Laterality: N/A;  . COLONOSCOPY    . FOREARM FRACTURE SURGERY Left ~ 02/2011   "broke arm; shattered wrist"  . FOREARM HARDWARE REMOVAL Left ~ 07/2011  . Spinal Nerve Ablation    . TEE WITHOUT CARDIOVERSION N/A 03/24/2016   Procedure: TRANSESOPHAGEAL ECHOCARDIOGRAM (TEE);  Surgeon: Sanda Klein, MD;  Location: [facility] ENDOSCOPY;  Service: Cardiovascular;  Laterality: N/A;    Current Outpatient Medications  Medication Sig Dispense Refill  . acetaminophen (TYLENOL) 650 MG CR tablet Take 650-1,300 mg by mouth every 8 (eight) hours as needed for pain.     Marland Kitchen amLODipine (NORVASC) 10 MG tablet TAKE 1 TABLET BY MOUTH EVERY DAY 90 tablet 1  . carvedilol (COREG) 12.5 MG tablet TAKE 1 TABLET (12.5 MG TOTAL) BY MOUTH 2 (TWO) TIMES DAILY. 180 tablet 3  . cholecalciferol (VITAMIN D) 1000 units tablet Take 1,000 Units by mouth daily.    . DULoxetine (CYMBALTA) 30 MG capsule Take 1 capsule (30 mg total) by mouth daily. ** Do  NOT chew, crush, or open capsule ** Please schedule a follow up appointment for more refills. 90 capsule 0  . flecainide (TAMBOCOR) 50 MG tablet Take 2 tablets by mouth daily as needed for breakthrough afib. 180 tablet 3  . LORazepam (ATIVAN) 0.5 MG tablet Take 1 tablet (0.5 mg total) by mouth 2 (two) times daily as needed for anxiety. 30 tablet 0  . XARELTO 20 MG TABS tablet TAKE 1 TABLET BY MOUTH EVERY DAY 90 tablet 2   No current facility-administered medications for this visit.     Allergies:   Ace inhibitors, Hctz [hydrochlorothiazide], Oxycodone, Prednisone, Sulfa antibiotics, Sulfur, and Voltaren [diclofenac sodium]    Social History:  The patient  reports that she quit smoking about 4 years ago. Her smoking use included cigarettes and e-cigarettes. She has a 22.00 pack-year smoking history. She has never used smokeless tobacco. She reports current alcohol use. She reports that she does not use drugs.   Family History:  The patient's  family history includes Bipolar disorder in her daughter; Diabetes in her paternal grandfather and paternal grandmother; Heart disease in her father, maternal grandfather, and paternal grandmother; Hypertension in her father and maternal grandmother; Lung cancer in her mother; Other in her daughter and son; Stroke in her father and maternal grandfather.   ROS:  Please see the history of present illness.   All other systems are personally reviewed and negative.    Exam:    Vital Signs:  BP 133/77   Pulse 71   Ht 5\' 7"  (1.702 m)   Wt 215 lb (97.5 kg)   BMI 33.67 kg/m   Well appearing, NAD, OP clear, normal WOB   Labs/Other Tests and Data Reviewed:    Recent Labs: 12/01/2017: ALT 19; BUN 13; Creatinine, Ser 0.74; Hemoglobin 13.1; Platelets 334; Potassium 3.6; Sodium 138   Wt Readings from Last 3 Encounters:  07/02/18 215 lb (97.5 kg)  06/30/18 215 lb (97.5 kg)  03/10/18 221 lb 6.4 oz (100.4 kg)     ASSESSMENT & PLAN:    1.  Paroxysmal atrial fibrillation Remains improved Wishes to defer ablation chads2vasc score is 4.  Continue xarelto  2. OSA Compliant with CPAP  3. HTN Stable No change required today  4. Overweight Lifestyle modification encouraged  Follow-up:  6 months with me  Patient Risk:  after full review of this patients clinical status, I feel that they are at moderate risk at this time.  Today, I have spent 15 minutes with the patient with telehealth technology discussing arrhythmia management .    Army Fossa, MD  07/02/2018 12:10 PM     [place] [address] [place] [address] [phone]  (office) [phone] (fax)

## 2018-07-15 ENCOUNTER — Encounter: Payer: Self-pay | Admitting: Physician Assistant

## 2018-07-15 DIAGNOSIS — F419 Anxiety disorder, unspecified: Secondary | ICD-10-CM

## 2018-07-15 DIAGNOSIS — F329 Major depressive disorder, single episode, unspecified: Secondary | ICD-10-CM

## 2018-07-15 MED ORDER — LORAZEPAM 0.5 MG PO TABS: 0.5000 mg | ORAL_TABLET | Freq: Two times a day (BID) | ORAL | 0 refills | Status: DC | PRN

## 2018-07-15 NOTE — Telephone Encounter (Signed)
Lorazepam last rx 07/31/17 #30 LOV: 06/30/18  Please advise

## 2018-08-05 LAB — HM MAMMOGRAPHY: HM Mammogram: NORMAL (ref 0–4)

## 2018-08-11 ENCOUNTER — Ambulatory Visit (INDEPENDENT_AMBULATORY_CARE_PROVIDER_SITE_OTHER): Payer: Medicare Other | Admitting: Internal Medicine

## 2018-08-11 ENCOUNTER — Encounter: Payer: Self-pay | Admitting: Internal Medicine

## 2018-08-11 ENCOUNTER — Other Ambulatory Visit: Payer: Self-pay

## 2018-08-11 VITALS — BP 132/78 | HR 61 | Ht 67.0 in | Wt 207.0 lb

## 2018-08-11 DIAGNOSIS — I483 Typical atrial flutter: Secondary | ICD-10-CM

## 2018-08-11 DIAGNOSIS — I10 Essential (primary) hypertension: Secondary | ICD-10-CM | POA: Diagnosis not present

## 2018-08-11 DIAGNOSIS — I4811 Longstanding persistent atrial fibrillation: Secondary | ICD-10-CM

## 2018-08-11 NOTE — Progress Notes (Signed)
HPI Mrs. Saxe returns today for followup of PAF. She has a h/o obesity and HTN. Her atrial fib has been well controlled over the past 2 years with minimal episodes. She has not had any problems tolerating flecainide. No exertional chest pain. She does have some chest wall pain which she says is intermittent.  Allergies  Allergen Reactions  . Ace Inhibitors Cough  . Hctz [Hydrochlorothiazide] Other (See Comments)    Caused drug-induced LUPUS  . Oxycodone Other (See Comments)    Hallucinations  . Prednisone Other (See Comments)    Made patient very aggressive  . Sulfa Antibiotics Nausea And Vomiting  . Sulfur Nausea And Vomiting  . Voltaren [Diclofenac Sodium] Other (See Comments)    Made patient become aggressive     Current Outpatient Medications  Medication Sig Dispense Refill  . acetaminophen (TYLENOL) 650 MG CR tablet Take 650-1,300 mg by mouth every 8 (eight) hours as needed for pain.     Marland Kitchen amLODipine (NORVASC) 10 MG tablet TAKE 1 TABLET BY MOUTH EVERY DAY 90 tablet 1  . carvedilol (COREG) 12.5 MG tablet TAKE 1 TABLET (12.5 MG TOTAL) BY MOUTH 2 (TWO) TIMES DAILY. 180 tablet 3  . cholecalciferol (VITAMIN D) 1000 units tablet Take 1,000 Units by mouth daily.    . DULoxetine (CYMBALTA) 30 MG capsule Take 1 capsule (30 mg total) by mouth daily. ** Do NOT chew, crush, or open capsule ** Please schedule a follow up appointment for more refills. 90 capsule 0  . flecainide (TAMBOCOR) 50 MG tablet Take 2 tablets by mouth daily as needed for breakthrough afib. 180 tablet 3  . LORazepam (ATIVAN) 0.5 MG tablet Take 1 tablet (0.5 mg total) by mouth 2 (two) times daily as needed for anxiety. 30 tablet 0  . XARELTO 20 MG TABS tablet TAKE 1 TABLET BY MOUTH EVERY DAY 90 tablet 2   No current facility-administered medications for this visit.      Past Medical History:  Diagnosis Date  . Allergy   . Anemia   . Anxiety   . Arthritis    "neck and lower back" (03/25/2016)  . Asthma  1990s X 1   "short term inhaler use"   . CAD (coronary artery disease)   . Chronic lower back pain   . Degenerative disorder of bone   . Depression   . Diastolic dysfunction   . Drug-induced lupus erythematosus    HCTZ induced; "still gettin over it" (03/25/2016)  . GERD (gastroesophageal reflux disease)   . Herniated disc, cervical   . Hyperlipidemia   . Hypertension   . Neuromuscular disorder ([place])    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 HTN  . Orthostatic hypotension   . OSA on CPAP   . Osteopenia   . PAF (paroxysmal atrial fibrillation) ([place])   . Pinched nerve in neck   . Sleep apnea    wears CPAP  . T12 compression fracture ([place]) 11/2015  . Vitamin D deficiency   . Whiplash injury 06/07/2010    ROS:   All systems reviewed and negative except as noted in the HPI.   Past Surgical History:  Procedure Laterality Date  . APPENDECTOMY  1990s  . ATRIAL FIBRILLATION ABLATION N/A 03/25/2016   Procedure: Atrial Fibrillation Ablation;  Surgeon: [name], MD;  Location: [place] [facility];  Service: Cardiovascular;  Laterality: N/A;  . COLONOSCOPY    . FOREARM FRACTURE SURGERY Left ~ 02/2011   "broke  arm; shattered wrist"  . FOREARM HARDWARE REMOVAL Left ~ 07/2011  . Spinal Nerve Ablation    . TEE WITHOUT CARDIOVERSION N/A 03/24/2016   Procedure: TRANSESOPHAGEAL ECHOCARDIOGRAM (TEE);  Surgeon: Sanda Klein, MD;  Location: [facility] ENDOSCOPY;  Service: Cardiovascular;  Laterality: N/A;     Family History  Problem Relation Age of Onset  . Lung cancer Mother   . Stroke Father   . Hypertension Father   . Heart disease Father   . Hypertension Maternal Grandmother   . Stroke Maternal Grandfather   . Heart disease Maternal Grandfather   . Diabetes Paternal Grandmother   . Heart disease Paternal Grandmother   . Diabetes Paternal Grandfather   . Bipolar disorder Daughter   . Other Daughter        fatty liver  . Other Son        fattye liver, born with 1  kidney  . Colon cancer Neg Hx   . Esophageal cancer Neg Hx   . Rectal cancer Neg Hx   . Stomach cancer Neg Hx      Social History   Socioeconomic History  . Marital status: Married    Spouse name: Not on file  . Number of children: 2  . Years of education: Not on file  . Highest education level: Not on file  Occupational History  . Occupation: retired  Scientific laboratory technician  . Financial resource strain: Not on file  . Food insecurity    Worry: Not on file    Inability: Not on file  . Transportation needs    Medical: Not on file    Non-medical: Not on file  Tobacco Use  . Smoking status: Former Smoker    Packs/day: 0.50    Years: 44.00    Pack years: 22.00    Types: Cigarettes, E-cigarettes    Quit date: 09/06/2013    Years since quitting: 4.9  . Smokeless tobacco: Never Used  Substance and Sexual Activity  . Alcohol use: Yes    Comment: 03/25/2016 "nothing for a couple years now; was having a drink on anniversary and Christmas"  . Drug use: No  . Sexual activity: Not Currently  Lifestyle  . Physical activity    Days per week: Not on file    Minutes per session: Not on file  . Stress: Not on file  Relationships  . Social Herbalist on phone: Not on file    Gets together: Not on file    Attends religious service: Not on file    Active member of club or organization: Not on file    Attends meetings of clubs or organizations: Not on file    Relationship status: Not on file  . Intimate partner violence    Fear of current or ex partner: Not on file    Emotionally abused: Not on file    Physically abused: Not on file    Forced sexual activity: Not on file  Other Topics Concern  . Not on file  Social History Narrative   ** Merged History Encounter **         BP 132/78   Pulse 61   Ht 5\' 7"  (1.702 m)   Wt 207 lb (93.9 kg)   SpO2 97%   BMI 32.42 kg/m   Physical Exam:  Well appearing NAD HEENT: Unremarkable Neck:  No JVD, no thyromegally Lymphatics:   No adenopathy Back:  No CVA tenderness Lungs:  Clear with no wheezes HEART:  Regular  rate rhythm, no murmurs, no rubs, no clicks Abd:  soft, positive bowel sounds, no organomegally, no rebound, no guarding Ext:  2 plus pulses, no edema, no cyanosis, no clubbing Skin:  No rashes no nodules Neuro:  CN II through XII intact, motor grossly intact  ECG - nsr  Assess/Plan: 1. PAF - she is maintaining NSR very nicely and she is tolerating the flecainide. She will continue her current meds.  2. HTN - her blood pressure is reasonably well controlled. Continue. 3. Obesity - she has lost 5 lbs. She is encouraged to lose more.  Mikle Bosworth.D.

## 2018-08-11 NOTE — Patient Instructions (Signed)

## 2018-08-30 ENCOUNTER — Other Ambulatory Visit (HOSPITAL_COMMUNITY): Payer: Self-pay | Admitting: *Deleted

## 2018-08-30 MED ORDER — FLECAINIDE ACETATE 50 MG PO TABS: ORAL_TABLET | ORAL | 2 refills | Status: DC

## 2018-09-27 ENCOUNTER — Other Ambulatory Visit: Payer: Self-pay | Admitting: Physician Assistant

## 2018-10-01 ENCOUNTER — Encounter: Payer: Self-pay | Admitting: Adult Health

## 2018-10-01 ENCOUNTER — Ambulatory Visit (INDEPENDENT_AMBULATORY_CARE_PROVIDER_SITE_OTHER): Payer: Medicare Other | Admitting: Adult Health

## 2018-10-01 ENCOUNTER — Other Ambulatory Visit: Payer: Self-pay

## 2018-10-01 DIAGNOSIS — G4733 Obstructive sleep apnea (adult) (pediatric): Secondary | ICD-10-CM | POA: Diagnosis not present

## 2018-10-01 NOTE — Patient Instructions (Signed)
Continue on CPAP At bedtime  .  Wear each night for at least 4hr each night .  Work on healthy weight .  CPAP download.  Order for New CPAP machine  Follow up with Dr. Elsworth Soho or [place] NP in 1 year and As needed

## 2018-10-01 NOTE — Progress Notes (Signed)
Virtual Visit via Telephone Note  I connected with [name] on 10/01/18 at  2:00 PM EDT by telephone and verified that I am speaking with the correct person using two identifiers.  Location: Patient: Home  Provider: Office    I discussed the limitations, risks, security and privacy concerns of performing an evaluation and management service by telephone and the availability of in person appointments. I also discussed with the patient that there may be a patient responsible charge related to this service. The patient expressed understanding and agreed to proceed.   History of Present Illness: 68 year old female followed for obstructive sleep apnea.  Patient has been on CPAP for many years previously moved from [place] to [place]. Medical history significant for chronic diastolic heart failure, A. fib   Today's tele-visit is a one-year follow-up for sleep apnea.  Patient is on nocturnal CPAP.  She says she wears her CPAP every night.  She gets in about 8 hours each night.  She feels rested with no significant daytime sleepiness.  She feels she benefits from CPAP.  She says her CPAP machine has recently brought up a message that she has passed motor usage limit.  She says her machine is several years old.  She wishes to get a new machine. She says she cannot go without her CPAP.  Going to get flu shot soon.   Observations/Objective: PSG 05/2004-mild OSA with AHI 11/hour corrected by CPAP 9 cm  Assessment and Plan: Obstructive sleep apnea with excellent compliance on CPAP. CPAP download requested.  Order for new CPAP machine  Obesity  -Healthy weight loss   Plan  Patient Instructions  Continue on CPAP At bedtime  .  Wear each night for at least 4hr each night .  Work on healthy weight .  CPAP download.  Order for New CPAP machine  Follow up with Dr. Elsworth Soho or Harvest Deist NP in 1 year and As needed         Follow Up Instructions: Follow up in 1 year and As needed      I discussed the assessment and treatment plan with the patient. The patient was provided an opportunity to ask questions and all were answered. The patient agreed with the plan and demonstrated an understanding of the instructions.   The patient was advised to call back or seek an in-person evaluation if the symptoms worsen or if the condition fails to improve as anticipated.  I provided 22  minutes of non-face-to-face time during this encounter.   Rexene Edison, NP

## 2018-10-06 ENCOUNTER — Encounter: Payer: Self-pay | Admitting: General Practice

## 2018-10-12 ENCOUNTER — Encounter: Payer: Self-pay | Admitting: Physician Assistant

## 2018-10-12 ENCOUNTER — Ambulatory Visit (INDEPENDENT_AMBULATORY_CARE_PROVIDER_SITE_OTHER): Payer: Medicare Other | Admitting: Physician Assistant

## 2018-10-12 ENCOUNTER — Other Ambulatory Visit: Payer: Self-pay

## 2018-10-12 VITALS — BP 102/62 | HR 68 | Temp 98.2°F | Resp 16 | Ht 66.0 in | Wt 222.0 lb

## 2018-10-12 DIAGNOSIS — Z23 Encounter for immunization: Secondary | ICD-10-CM

## 2018-10-12 DIAGNOSIS — Z1231 Encounter for screening mammogram for malignant neoplasm of breast: Secondary | ICD-10-CM

## 2018-10-12 DIAGNOSIS — I10 Essential (primary) hypertension: Secondary | ICD-10-CM | POA: Diagnosis not present

## 2018-10-12 DIAGNOSIS — Z0000 Encounter for general adult medical examination without abnormal findings: Secondary | ICD-10-CM

## 2018-10-12 LAB — COMPREHENSIVE METABOLIC PANEL
ALT: 16 U/L (ref 0–35)
AST: 16 U/L (ref 0–37)
Albumin: 4.1 g/dL (ref 3.5–5.2)
Alkaline Phosphatase: 106 U/L (ref 39–117)
BUN: 14 mg/dL (ref 6–23)
CO2: 29 mEq/L (ref 19–32)
Calcium: 9.5 mg/dL (ref 8.4–10.5)
Chloride: 102 mEq/L (ref 96–112)
Creatinine, Ser: 0.78 mg/dL (ref 0.40–1.20)
GFR: 73.59 mL/min (ref >60.00)
Glucose, Bld: 97 mg/dL (ref 70–99)
Potassium: 4.3 mEq/L (ref 3.5–5.1)
Sodium: 138 mEq/L (ref 135–145)
Total Bilirubin: 0.3 mg/dL (ref 0.2–1.2)
Total Protein: 7 g/dL (ref 6.0–8.3)

## 2018-10-12 LAB — LIPID PANEL
Cholesterol: 178 mg/dL (ref 0–200)
HDL: 44.3 mg/dL (ref >39.00)
LDL Cholesterol: 103 mg/dL — ABNORMAL HIGH (ref 0–99)
NonHDL: 133.81
Total CHOL/HDL Ratio: 4
Triglycerides: 154 mg/dL — ABNORMAL HIGH (ref 0.0–149.0)
VLDL: 30.8 mg/dL (ref 0.0–40.0)

## 2018-10-12 NOTE — Patient Instructions (Signed)
Please go to the lab for blood work.   Our office will call you with your results unless you have chosen to receive results via MyChart.  If your blood work is normal we will follow-up each year for physicals and as scheduled for chronic medical problems.  If anything is abnormal we will treat accordingly and get you in for a follow-up.  Please start OTC Nasacort once daily.  Saline nasal rinse in the evening. Let's see how this does for allergy symptoms.  Let me know if not improving.    Preventive Care 22 Years and Older, Female Preventive care refers to lifestyle choices and visits with your health care provider that can promote health and wellness. This includes:  A yearly physical exam. This is also called an annual well check.  Regular dental and eye exams.  Immunizations.  Screening for certain conditions.  Healthy lifestyle choices, such as diet and exercise. What can I expect for my preventive care visit? Physical exam Your health care provider will check:  Height and weight. These may be used to calculate body mass index (BMI), which is a measurement that tells if you are at a healthy weight.  Heart rate and blood pressure.  Your skin for abnormal spots. Counseling Your health care provider may ask you questions about:  Alcohol, tobacco, and drug use.  Emotional well-being.  Home and relationship well-being.  Sexual activity.  Eating habits.  History of falls.  Memory and ability to understand (cognition).  Work and work Statistician.  Pregnancy and menstrual history. What immunizations do I need?  Influenza (flu) vaccine  This is recommended every year. Tetanus, diphtheria, and pertussis (Tdap) vaccine  You may need a Td booster every 10 years. Varicella (chickenpox) vaccine  You may need this vaccine if you have not already been vaccinated. Zoster (shingles) vaccine  You may need this after age 60. Pneumococcal conjugate (PCV13) vaccine   One dose is recommended after age 26. Pneumococcal polysaccharide (PPSV23) vaccine  One dose is recommended after age 61. Measles, mumps, and rubella (MMR) vaccine  You may need at least one dose of MMR if you were born in 1957 or later. You may also need a second dose. Meningococcal conjugate (MenACWY) vaccine  You may need this if you have certain conditions. Hepatitis A vaccine  You may need this if you have certain conditions or if you travel or work in places where you may be exposed to hepatitis A. Hepatitis B vaccine  You may need this if you have certain conditions or if you travel or work in places where you may be exposed to hepatitis B. Haemophilus influenzae type b (Hib) vaccine  You may need this if you have certain conditions. You may receive vaccines as individual doses or as more than one vaccine together in one shot (combination vaccines). Talk with your health care provider about the risks and benefits of combination vaccines. What tests do I need? Blood tests  Lipid and cholesterol levels. These may be checked every 5 years, or more frequently depending on your overall health.  Hepatitis C test.  Hepatitis B test. Screening  Lung cancer screening. You may have this screening every year starting at age 50 if you have a 30-pack-year history of smoking and currently smoke or have quit within the past 15 years.  Colorectal cancer screening. All adults should have this screening starting at age 35 and continuing until age 4. Your health care provider may recommend screening at age 63  if you are at increased risk. You will have tests every 1-10 years, depending on your results and the type of screening test.  Diabetes screening. This is done by checking your blood sugar (glucose) after you have not eaten for a while (fasting). You may have this done every 1-3 years.  Mammogram. This may be done every 1-2 years. Talk with your health care provider about how often  you should have regular mammograms.  BRCA-related cancer screening. This may be done if you have a family history of breast, ovarian, tubal, or peritoneal cancers. Other tests  Sexually transmitted disease (STD) testing.  Bone density scan. This is done to screen for osteoporosis. You may have this done starting at age 82. Follow these instructions at home: Eating and drinking  Eat a diet that includes fresh fruits and vegetables, whole grains, lean protein, and low-fat dairy products. Limit your intake of foods with high amounts of sugar, saturated fats, and salt.  Take vitamin and mineral supplements as recommended by your health care provider.  Do not drink alcohol if your health care provider tells you not to drink.  If you drink alcohol: ? Limit how much you have to 0-1 drink a day. ? Be aware of how much alcohol is in your drink. In the [place] one drink equals one 12 oz bottle of beer (355 mL), one 5 oz glass of wine (148 mL), or one 1 oz glass of hard liquor (44 mL). Lifestyle  Take daily care of your teeth and gums.  Stay active. Exercise for at least 30 minutes on 5 or more days each week.  Do not use any products that contain nicotine or tobacco, such as cigarettes, e-cigarettes, and chewing tobacco. If you need help quitting, ask your health care provider.  If you are sexually active, practice safe sex. Use a condom or other form of protection in order to prevent STIs (sexually transmitted infections).  Talk with your health care provider about taking a low-dose aspirin or statin. What's next?  Go to your health care provider once a year for a well check visit.  Ask your health care provider how often you should have your eyes and teeth checked.  Stay up to date on all vaccines. This information is not intended to replace advice given to you by your health care provider. Make sure you discuss any questions you have with your health care provider. Document Released:  01/19/2015 Document Revised: 12/17/2017 Document Reviewed: 12/17/2017 Elsevier Patient Education  2020 [employer]. .
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
## 2018-10-16 ENCOUNTER — Other Ambulatory Visit: Payer: Self-pay | Admitting: Internal Medicine

## 2018-10-18 NOTE — Telephone Encounter (Signed)
Pt last saw Dr Lovena Le 08/11/18, last labs 10/12/18 Creat 0.78, age 68, weight 100.7kg, CrCl 111.26, based on CrCl pt is on appropriate dosage of Xarelto 20mg  QD.  Will refill rx.

## 2018-11-05 ENCOUNTER — Ambulatory Visit: Payer: Medicare Other | Admitting: Adult Health

## 2018-11-09 ENCOUNTER — Other Ambulatory Visit (HOSPITAL_BASED_OUTPATIENT_CLINIC_OR_DEPARTMENT_OTHER): Payer: Self-pay | Admitting: Physician Assistant

## 2018-11-09 DIAGNOSIS — Z1231 Encounter for screening mammogram for malignant neoplasm of breast: Secondary | ICD-10-CM

## 2018-11-10 ENCOUNTER — Ambulatory Visit (HOSPITAL_BASED_OUTPATIENT_CLINIC_OR_DEPARTMENT_OTHER)
Admission: RE | Admit: 2018-11-10 | Discharge: 2018-11-10 | Disposition: A | Discharge status: Home / Self Care | Payer: Medicare Other | Source: Ambulatory Visit | Attending: Physician Assistant | Admitting: Physician Assistant

## 2018-11-10 ENCOUNTER — Other Ambulatory Visit: Payer: Self-pay

## 2018-11-10 DIAGNOSIS — Z1231 Encounter for screening mammogram for malignant neoplasm of breast: Secondary | ICD-10-CM

## 2018-11-10 IMAGING — MG DIGITAL SCREENING BILAT W/ TOMO W/ CAD
8 series · 8 of 24 positions shown · non-contrast
Comparison: Previous exam(s).

CLINICAL DATA: Screening.

EXAM:
DIGITAL SCREENING BILATERAL MAMMOGRAM WITH TOMO AND CAD

[L MLO synth-2D]
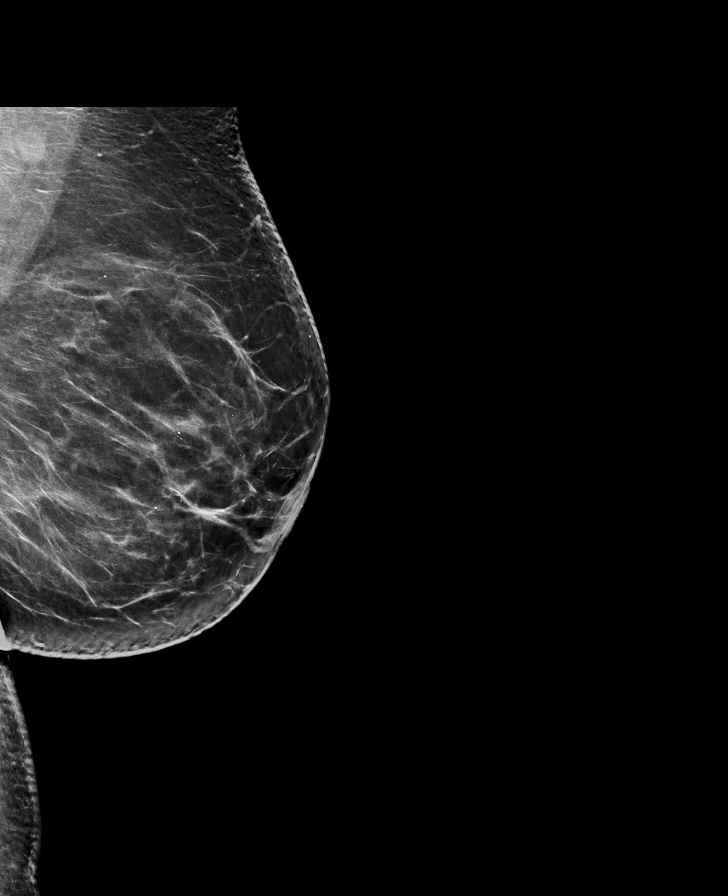

[R CC synth-2D]
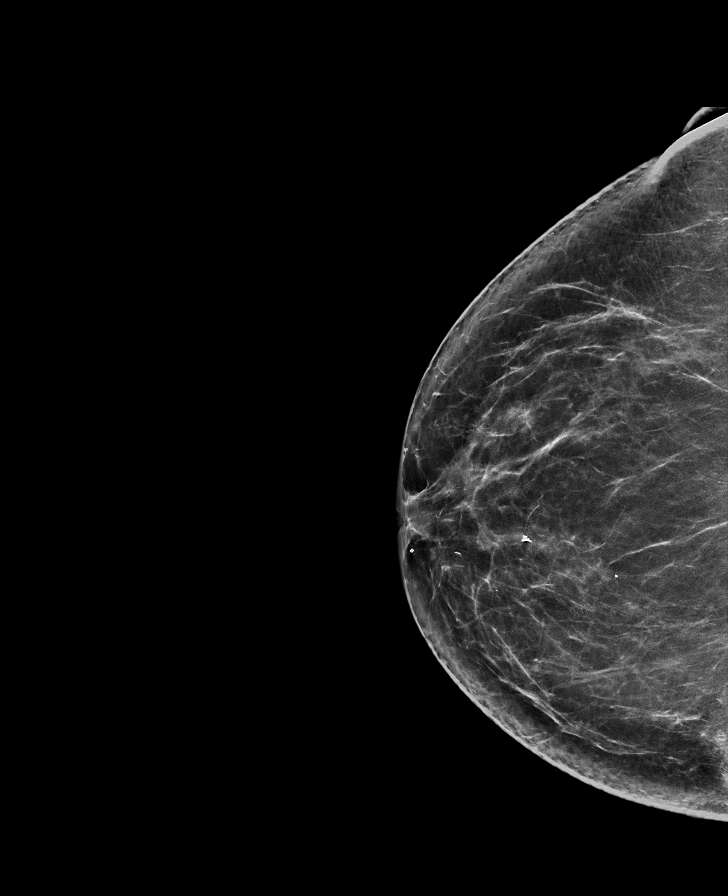

[R MLO synth-2D]
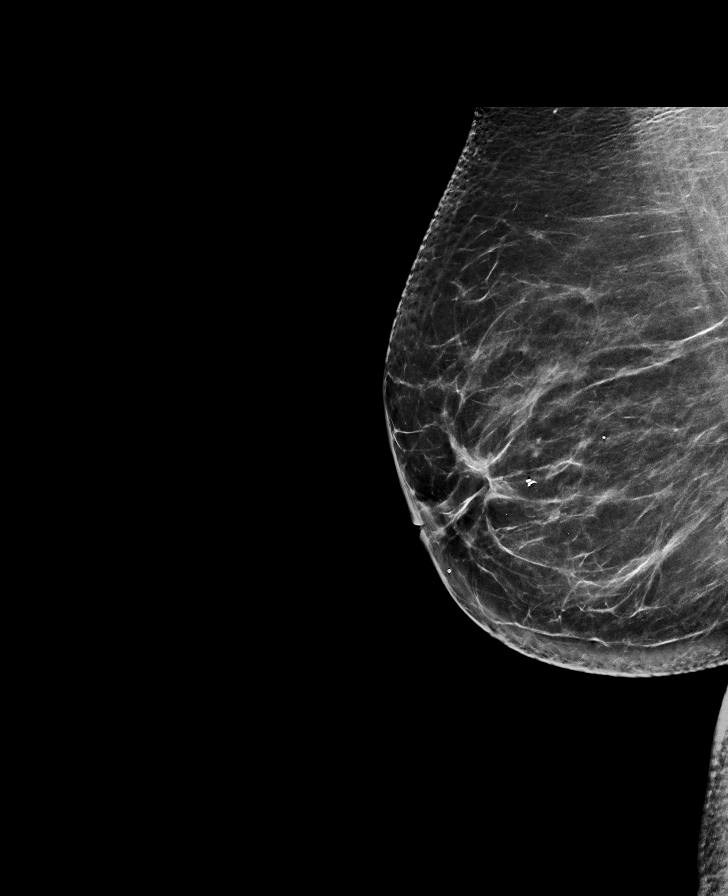

[L CC synth-2D]
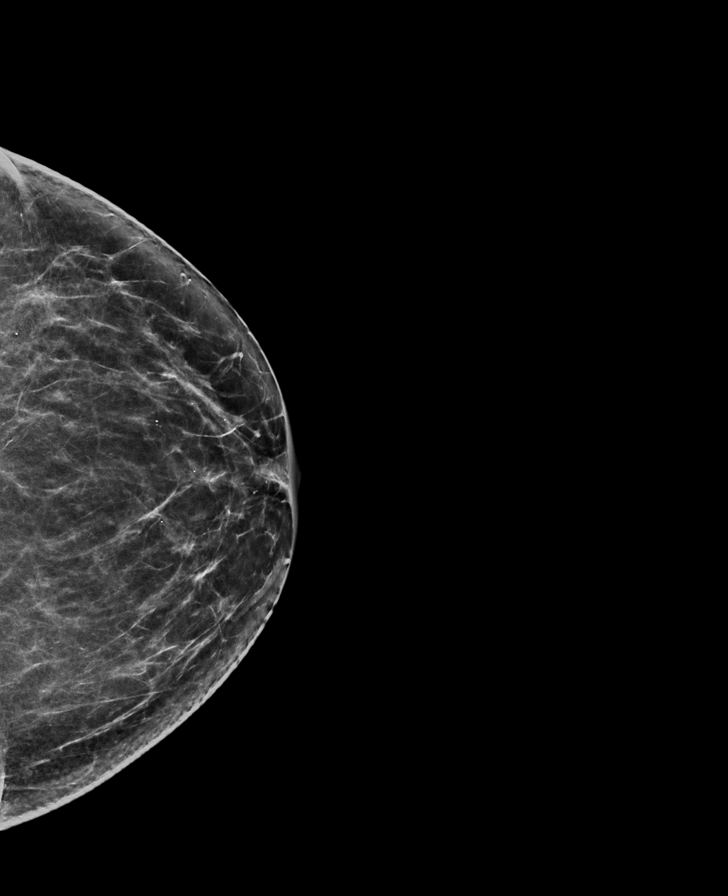

[R MLO tomo · tomo slice 42/83.0]
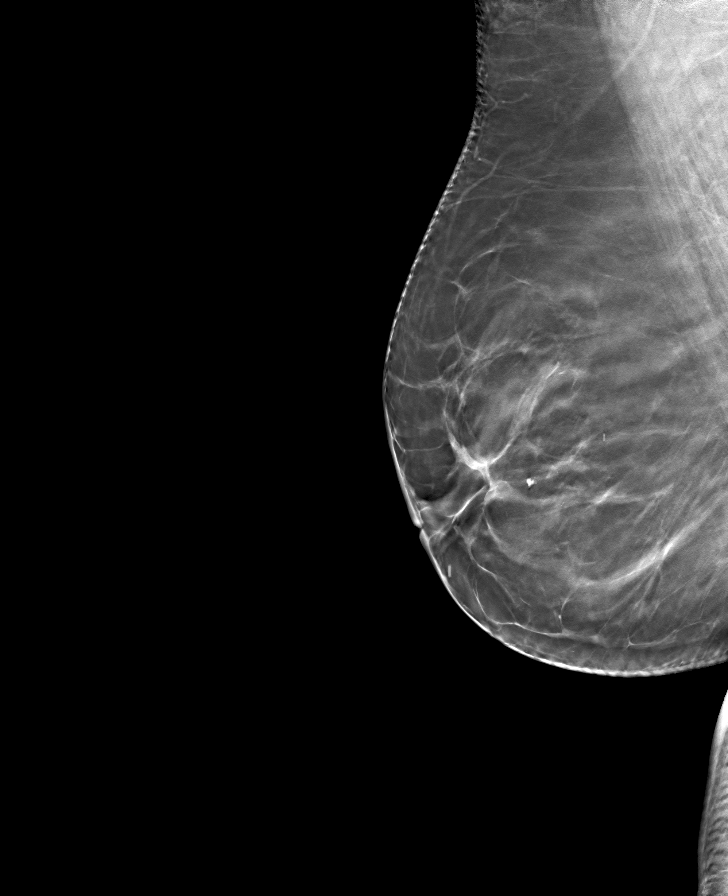

[R CC tomo · tomo slice 39/78.0]
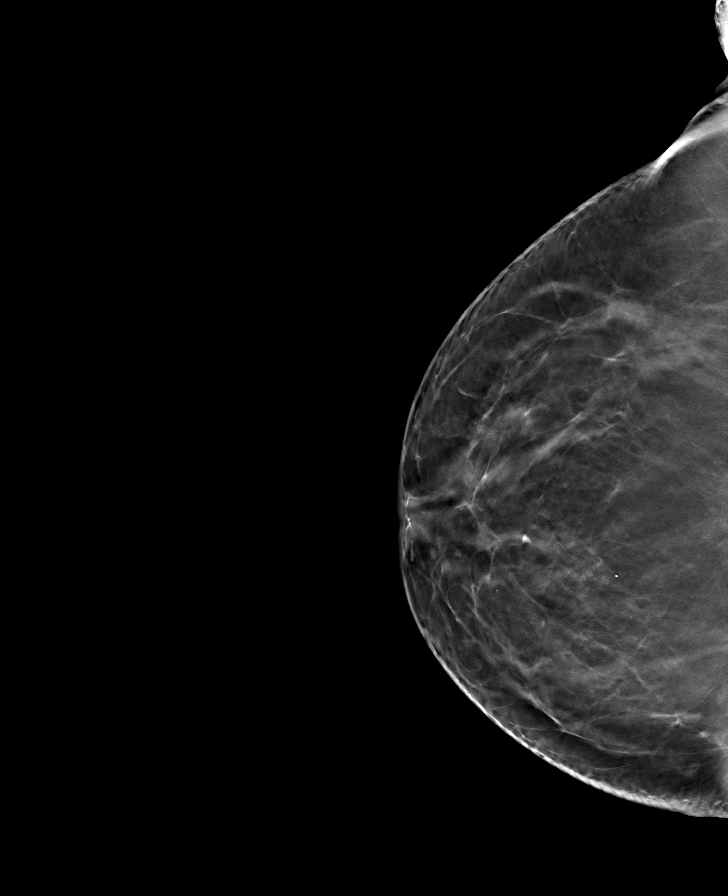

[L MLO tomo · tomo slice 43/84.0]
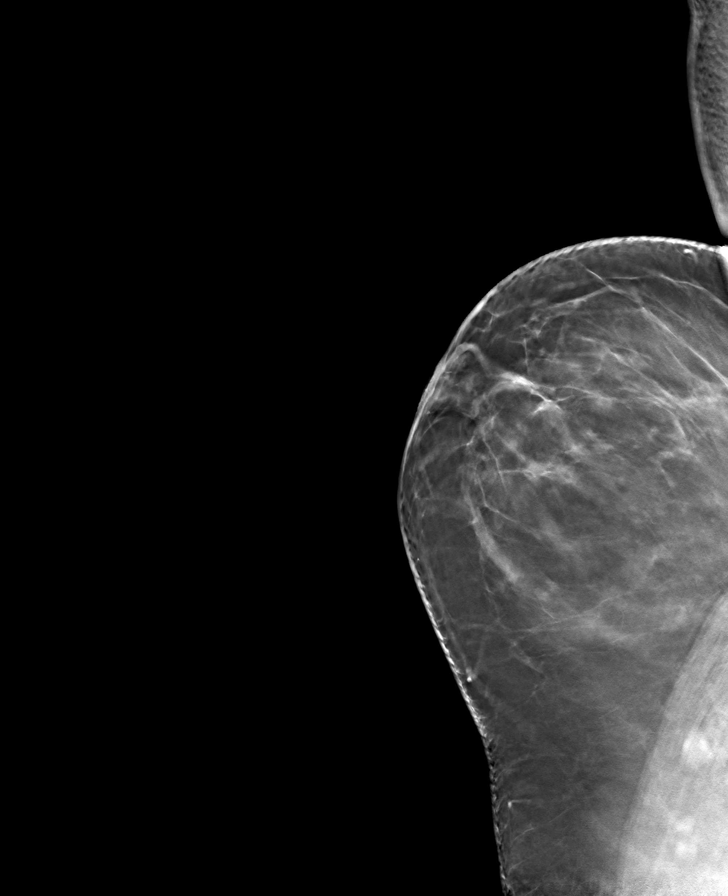

[L CC tomo · tomo slice 36/71.0]
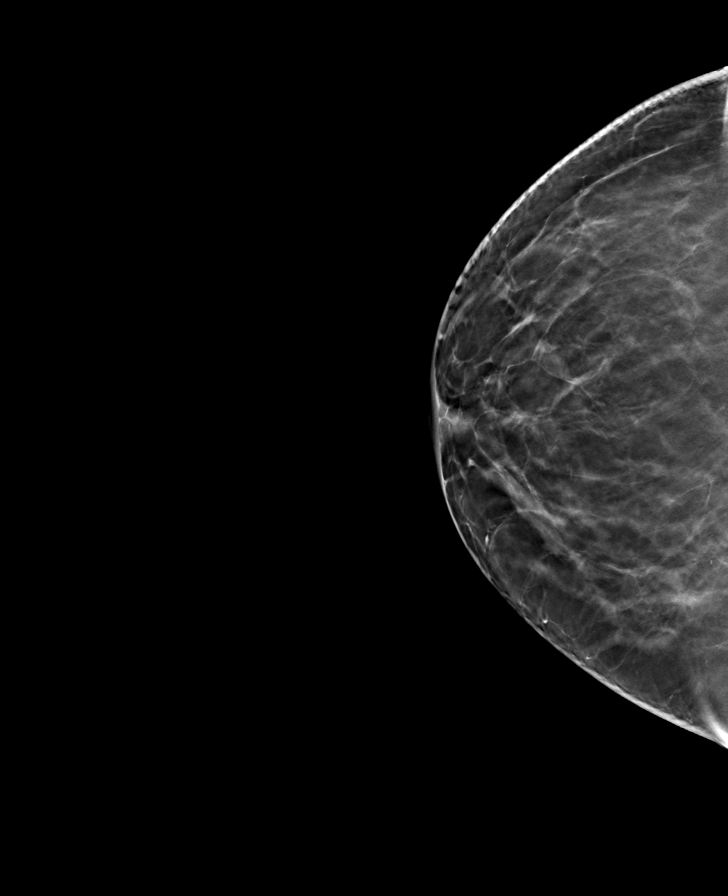

[8 of 24 positions shown; findings below may reference images not displayed]

ACR Breast Density Category b: There are scattered areas of
fibroglandular density.
FINDINGS: There are no findings suspicious for malignancy. Images were
processed with CAD.
IMPRESSION: No mammographic evidence of malignancy. A result letter of this
screening mammogram will be mailed directly to the patient.

RECOMMENDATION:
Screening mammogram in one year. (Code:[TQ])

BI-RADS CATEGORY  1: Negative.

## 2018-11-23 ENCOUNTER — Ambulatory Visit (INDEPENDENT_AMBULATORY_CARE_PROVIDER_SITE_OTHER): Payer: Medicare Other | Admitting: Adult Health

## 2018-11-23 ENCOUNTER — Encounter: Payer: Self-pay | Admitting: Adult Health

## 2018-11-23 ENCOUNTER — Other Ambulatory Visit: Payer: Self-pay

## 2018-11-23 DIAGNOSIS — G4733 Obstructive sleep apnea (adult) (pediatric): Secondary | ICD-10-CM

## 2018-11-23 DIAGNOSIS — J309 Allergic rhinitis, unspecified: Secondary | ICD-10-CM

## 2018-11-23 NOTE — Progress Notes (Signed)
Virtual Visit via Telephone Note  I connected with Shannon [name] on 11/23/18 at  1:30 PM EST by telephone and verified that I am speaking with the correct person using two identifiers.  Location: Patient: Office  Provider: Home    I discussed the limitations, risks, security and privacy concerns of performing an evaluation and management service by telephone and the availability of in person appointments. I also discussed with the patient that there may be a patient responsible charge related to this service. The patient expressed understanding and agreed to proceed.   History of Present Illness: 68 year old female followed for obstructive sleep apnea.  Has been on CPAP for many years previously moved from [place] to [facility] history significant for chronic diastolic heart failure and atrial fibrillation  Today's televisit as a follow-up for obstructive sleep apnea.  Patient has recently gotten a new CPAP machine.  She has underlying obstructive sleep apnea is on nocturnal CPAP.  Says she is doing very well.  She likes her new machine a lot.  Says her mask is fitting well.  She has no issues to report.  She says she wears her machine every single night never goes without it when she is sleeping.  She feels that she benefits from CPAP.  Her 30-day download shows excellent 100% usage with daily average usage at 8 hours.  She is on auto CPAP 4 to 20 cm H2O.  AHI 0.2.  Does complain that [place] more [place] and seems to be more damp that she has had more allergy symptoms with nasal drainage and itchy watery eyes.  She has been using Nasonex and saline.   Observations/Objective: PSG 05/2004-mild OSA with AHI 11/hour corrected by CPAP 9 cm   Assessment and Plan: Obstructive sleep apnea with excellent control and compliance on nocturnal CPAP. Continue on current settings.  Allergic rhinitis-add Claritin.  Use saline nasal  rinses.  Add saline nasal gel at bedtime.  May use Nasonex as needed  Plan  Patient Instructions  Continue on CPAP At bedtime  .  Keep up the good work. Do not drive if sleepy Work on healthy weight .  Add Claritin 10 mg at bedtime as needed for nasal drainage May use Nasonex daily as needed Saline nasal rinses as needed May use saline nasal gel at bedtime as needed Follow up with Dr. Elsworth Soho or  NP in 1 year and As needed        Follow Up Instructions: Follow-up in 1 year and as needed   I discussed the assessment and treatment plan with the patient. The patient was provided an opportunity to ask questions and all were answered. The patient agreed with the plan and demonstrated an understanding of the instructions.   The patient was advised to call back or seek an in-person evaluation if the symptoms worsen or if the condition fails to improve as anticipated.  I provided 22 minutes of non-face-to-face time during this encounter.   Rexene Edison, NP

## 2018-11-23 NOTE — Patient Instructions (Signed)
Continue on CPAP At bedtime  .  Keep up the good work. Do not drive if sleepy Work on healthy weight .  Add Claritin 10 mg at bedtime as needed for nasal drainage May use Nasonex daily as needed Saline nasal rinses as needed May use saline nasal gel at bedtime as needed Follow up with Dr. Elsworth Soho or Guneet Delpino NP in 1 year and As needed

## 2018-12-20 ENCOUNTER — Telehealth (INDEPENDENT_AMBULATORY_CARE_PROVIDER_SITE_OTHER): Payer: Medicare Other | Admitting: Internal Medicine

## 2018-12-20 ENCOUNTER — Encounter: Payer: Self-pay | Admitting: Internal Medicine

## 2018-12-20 VITALS — BP 157/89 | HR 71 | Ht 66.0 in | Wt 222.0 lb

## 2018-12-20 DIAGNOSIS — I480 Paroxysmal atrial fibrillation: Secondary | ICD-10-CM | POA: Diagnosis not present

## 2018-12-20 DIAGNOSIS — I10 Essential (primary) hypertension: Secondary | ICD-10-CM

## 2018-12-20 NOTE — Progress Notes (Signed)
Electrophysiology TeleHealth Note   Due to national recommendations of social distancing due to COVID 19, an audio/video telehealth visit is felt to be most appropriate for this patient at this time.  See MyChart message from today for the patient's consent to telehealth for [facility].   Date:  12/20/2018   ID:  Shannon [name], DOB May 07, 1950, MRN [id number]  Location: patient's home  Provider location:  [facility]  Evaluation Performed: Follow-up visit  PCP:  Brunetta Jeans, PA-C   Electrophysiologist:  Dr Rayann Heman  Chief Complaint:  palpitations  History of Present Illness:    Shannon [name] is a 68 y.o. female who presents via telehealth conferencing today.  Since last being seen in our clinic, the patient reports doing very well. Her afib has been reasonably controlled with flecainide.  About two weeks ago, she had an episode where she jumped up quickly from bed when her alarm went off.  She was dizzy and found her BP to be low (100/60).  Today, she denies symptoms of palpitations, chest pain, shortness of breath,  lower extremity edema, dizziness, presyncope, or syncope.  The patient is otherwise without complaint today.  The patient denies symptoms of fevers, chills, cough, or new SOB worrisome for COVID 19.  Past Medical History:  Diagnosis Date  . Allergy   . Anemia   . Anxiety   . Arthritis    "neck and lower back" (03/25/2016)  . Asthma 1990s X 1   "short term inhaler use"   . CAD (coronary artery disease)   . Chronic lower back pain   . Degenerative disorder of bone   . Depression   . Diastolic dysfunction   . Drug-induced lupus erythematosus    HCTZ induced; "still gettin over it" (03/25/2016)  . GERD (gastroesophageal reflux disease)   . Herniated disc, cervical   . Hyperlipidemia   . Hypertension   . Neuromuscular disorder ([place])    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 HTN  . Orthostatic hypotension   . OSA on CPAP     . Osteopenia   . PAF (paroxysmal atrial fibrillation) ([place])   . Pinched nerve in neck   . Sleep apnea    wears CPAP  . T12 compression fracture ([place]) 11/2015  . Vitamin D deficiency   . Whiplash injury 06/07/2010    Past Surgical History:  Procedure Laterality Date  . APPENDECTOMY  1990s  . ATRIAL FIBRILLATION ABLATION N/A 03/25/2016   Procedure: Atrial Fibrillation Ablation;  Surgeon: [name], MD;  Location: [place] [facility];  Service: Cardiovascular;  Laterality: N/A;  . COLONOSCOPY    . FOREARM FRACTURE SURGERY Left ~ 02/2011   "broke arm; shattered wrist"  . FOREARM HARDWARE REMOVAL Left ~ 07/2011  . Spinal Nerve Ablation    . TEE WITHOUT CARDIOVERSION N/A 03/24/2016   Procedure: TRANSESOPHAGEAL ECHOCARDIOGRAM (TEE);  Surgeon: Sanda Klein, MD;  Location: [facility] ENDOSCOPY;  Service: Cardiovascular;  Laterality: N/A;    Current Outpatient Medications  Medication Sig Dispense Refill  . acetaminophen (TYLENOL) 650 MG CR tablet Take 650-1,300 mg by mouth every 8 (eight) hours as needed for pain.     Marland Kitchen amLODipine (NORVASC) 10 MG tablet TAKE 1 TABLET BY MOUTH EVERY DAY (Patient taking differently: Take 5 mg by mouth daily. ) 90 tablet 1  . carvedilol (COREG) 12.5 MG tablet TAKE 1 TABLET (12.5 MG TOTAL) BY MOUTH 2 (TWO) TIMES DAILY. 180 tablet 3  . cholecalciferol (  VITAMIN D) 1000 units tablet Take 1,000 Units by mouth daily.    . DULoxetine (CYMBALTA) 30 MG capsule TAKE 1 CAPSULE BY MOUTH DAILY. ** DO NOT CHEW, CRUSH, OR OPEN CAPSULE ** PLEASE SCHEDULE A FOLLOW UP APPOINTMENT FOR MORE REFILLS. 90 capsule 0  . flecainide (TAMBOCOR) 50 MG tablet Take 2 tablets by mouth daily as needed for breakthrough afib. 60 tablet 2  . LORazepam (ATIVAN) 0.5 MG tablet Take 1 tablet (0.5 mg total) by mouth 2 (two) times daily as needed for anxiety. 30 tablet 0  . XARELTO 20 MG TABS tablet TAKE 1 TABLET BY MOUTH EVERY DAY 30 tablet 6   No current facility-administered medications for this visit.     Allergies:   Ace inhibitors, Hctz [hydrochlorothiazide], Oxycodone, Prednisone, Sulfa antibiotics, Sulfur, and Voltaren [diclofenac sodium]   Social History:  The patient  reports that she quit smoking about 5 years ago. Her smoking use included cigarettes and e-cigarettes. She has a 22.00 pack-year smoking history. She has never used smokeless tobacco. She reports current alcohol use. She reports that she does not use drugs.   Family History:  The patient's family history includes Bipolar disorder in her daughter; Diabetes in her paternal grandfather and paternal grandmother; Heart disease in her father, maternal grandfather, and paternal grandmother; Hypertension in her father and maternal grandmother; Lung cancer in her mother; Other in her daughter and son; Stroke in her father and maternal grandfather.   ROS:  Please see the history of present illness.   All other systems are personally reviewed and negative.    Exam:    Vital Signs:  BP (!) 157/89   Pulse 71   Ht 5\' 6"  (1.676 m)   Wt 222 lb (100.7 kg)   BMI 35.83 kg/m   Well sounding and appearing, alert and conversant, regular work of breathing,  good skin color Eyes- anicteric, neuro- grossly intact, skin- no apparent rash or lesions or cyanosis, mouth- oral mucosa is pink  Labs/Other Tests and Data Reviewed:    Recent Labs: 10/12/2018: ALT 16; BUN 14; Creatinine, Ser 0.78; Potassium 4.3; Sodium 138   Wt Readings from Last 3 Encounters:  12/20/18 222 lb (100.7 kg)  10/12/18 222 lb (100.7 kg)  08/11/18 207 lb (93.9 kg)     ASSESSMENT & PLAN:    1.  Paroxysmal atrial fibrillation Doing well, with rare afib since starting flecainide She is not interested in repeat ablation currently We discussed the importance of lifestyle modification at length today.  Regular exercise and weight reduction would help both her AF and elevated BP chads2vasc score is 4.  She is on xarelto  2. HTN Elevated today but reports typically  good BP control with rare low BPs No change required today  3. Obesity Lifestyle modification encouraged  4. OSA Compliant with CPAP  5. Dizziness Only a single episode in the setting of waking quickly and low BP If episodes increase in frequency or palpitations increase, we could consider [name] for afib management post ablation   Follow-up:  Follow-up with AF clinic in 6 months and Dr Lovena Le as scheduled I will see when needed   Patient Risk:  after full review of this patients clinical status, I feel that they are at moderate risk at this time.  Today, I have spent 15 minutes with the patient with telehealth technology discussing arrhythmia management .    Army Fossa, MD  12/20/2018 9:40 AM     [facility] [address] [place] [address] [phone] (office) [phone] (fax)

## 2018-12-27 ENCOUNTER — Other Ambulatory Visit: Payer: Self-pay | Admitting: Physician Assistant

## 2019-01-31 ENCOUNTER — Other Ambulatory Visit: Payer: Self-pay

## 2019-01-31 MED ORDER — CARVEDILOL 12.5 MG PO TABS: 12.5000 mg | ORAL_TABLET | Freq: Two times a day (BID) | ORAL | 3 refills | Status: DC

## 2019-02-07 ENCOUNTER — Other Ambulatory Visit: Payer: Self-pay | Admitting: Physician Assistant

## 2019-02-19 DIAGNOSIS — Z23 Encounter for immunization: Secondary | ICD-10-CM | POA: Diagnosis not present

## 2019-02-21 ENCOUNTER — Encounter: Payer: Self-pay | Admitting: Physician Assistant

## 2019-02-23 ENCOUNTER — Ambulatory Visit (HOSPITAL_COMMUNITY)
Admission: RE | Admit: 2019-02-23 | Discharge: 2019-02-23 | Disposition: A | Discharge status: Home / Self Care | Payer: Medicare Other | Source: Ambulatory Visit | Attending: Nurse Practitioner | Admitting: Nurse Practitioner

## 2019-02-23 ENCOUNTER — Other Ambulatory Visit: Payer: Self-pay

## 2019-02-23 ENCOUNTER — Encounter (HOSPITAL_COMMUNITY): Payer: Self-pay | Admitting: Nurse Practitioner

## 2019-02-23 VITALS — BP 140/76 | HR 58 | Ht 66.0 in | Wt 224.8 lb

## 2019-02-23 DIAGNOSIS — I2510 Atherosclerotic heart disease of native coronary artery without angina pectoris: Secondary | ICD-10-CM | POA: Diagnosis not present

## 2019-02-23 DIAGNOSIS — F329 Major depressive disorder, single episode, unspecified: Secondary | ICD-10-CM | POA: Insufficient documentation

## 2019-02-23 DIAGNOSIS — Z8249 Family history of ischemic heart disease and other diseases of the circulatory system: Secondary | ICD-10-CM | POA: Insufficient documentation

## 2019-02-23 DIAGNOSIS — D649 Anemia, unspecified: Secondary | ICD-10-CM | POA: Insufficient documentation

## 2019-02-23 DIAGNOSIS — Z87891 Personal history of nicotine dependence: Secondary | ICD-10-CM | POA: Insufficient documentation

## 2019-02-23 DIAGNOSIS — F419 Anxiety disorder, unspecified: Secondary | ICD-10-CM | POA: Insufficient documentation

## 2019-02-23 DIAGNOSIS — I9589 Other hypotension: Secondary | ICD-10-CM | POA: Diagnosis not present

## 2019-02-23 DIAGNOSIS — E785 Hyperlipidemia, unspecified: Secondary | ICD-10-CM | POA: Insufficient documentation

## 2019-02-23 DIAGNOSIS — Z79899 Other long term (current) drug therapy: Secondary | ICD-10-CM | POA: Diagnosis not present

## 2019-02-23 DIAGNOSIS — G8929 Other chronic pain: Secondary | ICD-10-CM | POA: Diagnosis not present

## 2019-02-23 DIAGNOSIS — I4891 Unspecified atrial fibrillation: Secondary | ICD-10-CM | POA: Insufficient documentation

## 2019-02-23 DIAGNOSIS — M545 Low back pain: Secondary | ICD-10-CM | POA: Diagnosis not present

## 2019-02-23 DIAGNOSIS — I483 Typical atrial flutter: Secondary | ICD-10-CM

## 2019-02-23 DIAGNOSIS — I110 Hypertensive heart disease with heart failure: Secondary | ICD-10-CM | POA: Insufficient documentation

## 2019-02-23 DIAGNOSIS — R0609 Other forms of dyspnea: Secondary | ICD-10-CM

## 2019-02-23 DIAGNOSIS — Z7901 Long term (current) use of anticoagulants: Secondary | ICD-10-CM | POA: Diagnosis not present

## 2019-02-23 DIAGNOSIS — I480 Paroxysmal atrial fibrillation: Secondary | ICD-10-CM

## 2019-02-23 DIAGNOSIS — D6869 Other thrombophilia: Secondary | ICD-10-CM | POA: Diagnosis not present

## 2019-02-23 DIAGNOSIS — R0602 Shortness of breath: Secondary | ICD-10-CM

## 2019-02-23 DIAGNOSIS — R0600 Dyspnea, unspecified: Secondary | ICD-10-CM | POA: Diagnosis not present

## 2019-02-23 DIAGNOSIS — M5412 Radiculopathy, cervical region: Secondary | ICD-10-CM | POA: Diagnosis not present

## 2019-02-23 DIAGNOSIS — G4733 Obstructive sleep apnea (adult) (pediatric): Secondary | ICD-10-CM | POA: Insufficient documentation

## 2019-02-23 DIAGNOSIS — I509 Heart failure, unspecified: Secondary | ICD-10-CM | POA: Diagnosis not present

## 2019-02-23 DIAGNOSIS — M1389 Other specified arthritis, multiple sites: Secondary | ICD-10-CM | POA: Diagnosis not present

## 2019-02-23 DIAGNOSIS — E559 Vitamin D deficiency, unspecified: Secondary | ICD-10-CM | POA: Diagnosis not present

## 2019-02-23 LAB — CBC
HCT: 38.7 % (ref 36.0–46.0)
Hemoglobin: 12.1 g/dL (ref 12.0–15.0)
MCH: 29.2 pg (ref 26.0–34.0)
MCHC: 31.3 g/dL (ref 30.0–36.0)
MCV: 93.5 fL (ref 80.0–100.0)
Platelets: 327 10*3/uL (ref 150–400)
RBC: 4.14 MIL/uL (ref 3.87–5.11)
RDW: 13.7 % (ref 11.5–15.5)
WBC: 7.4 10*3/uL (ref 4.0–10.5)
nRBC: 0 % (ref 0.0–0.2)

## 2019-02-23 LAB — COMPREHENSIVE METABOLIC PANEL
ALT: 22 U/L (ref 0–44)
AST: 21 U/L (ref 15–41)
Albumin: 3.7 g/dL (ref 3.5–5.0)
Alkaline Phosphatase: 89 U/L (ref 38–126)
Anion gap: 9 (ref 5–15)
BUN: 9 mg/dL (ref 8–23)
CO2: 25 mmol/L (ref 22–32)
Calcium: 9.4 mg/dL (ref 8.9–10.3)
Chloride: 103 mmol/L (ref 98–111)
Creatinine, Ser: 0.73 mg/dL (ref 0.44–1.00)
GFR calc Af Amer: >60 mL/min (ref >60)
GFR calc non Af Amer: >60 mL/min (ref >60)
Glucose, Bld: 121 mg/dL — ABNORMAL HIGH (ref 70–99)
Potassium: 4 mmol/L (ref 3.5–5.1)
Sodium: 137 mmol/L (ref 135–145)
Total Bilirubin: 0.9 mg/dL (ref 0.3–1.2)
Total Protein: 7.2 g/dL (ref 6.5–8.1)

## 2019-02-23 LAB — TSH: TSH: 1.486 u[IU]/mL (ref 0.350–4.500)

## 2019-02-23 NOTE — Progress Notes (Addendum)
Primary Care Physician: Brunetta Jeans, PA-C Referring Physician:Dr. Brien Few Shannon [name] is a 69 y.o. female with a h/o afib s/p ablation , HTN, CHF, OSA on CPAP that is here today for 2 episodes of early am slow irregular pulse associated with low  BP, diaphoresis, lightheadedness. The first one was in December that was associated with extreme diaphoresis. She was seen by Dr. Rayann Heman around that time and he was not too concerned as it was an isolated event. This last week, she had another episode very similar to the first, but she did not perspire as heavily. She has not appreciated any rapid afib. She has noted increased shortness of breath with minimal exertional activities for the last 6 months. She states that her sister recently had a syncopal episode and was found to need a PPM. CHA2DS2VASc score of 4.   Today, she denies symptoms of palpitations, chest pain, shortness of breath, orthopnea, PND, lower extremity edema, dizziness, presyncope, syncope, or neurologic sequela. The patient is tolerating medications without difficulties and is otherwise without complaint today.   Past Medical History:  Diagnosis Date  . Allergy   . Anemia   . Anxiety   . Arthritis    "neck and lower back" (03/25/2016)  . Asthma 1990s X 1   "short term inhaler use"   . CAD (coronary artery disease)   . Chronic lower back pain   . Degenerative disorder of bone   . Depression   . Diastolic dysfunction   . Drug-induced lupus erythematosus    HCTZ induced; "still gettin over it" (03/25/2016)  . GERD (gastroesophageal reflux disease)   . Herniated disc, cervical   . Hyperlipidemia   . Hypertension   . Neuromuscular disorder ([place])    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 HTN  . Orthostatic hypotension   . OSA on CPAP   . Osteopenia   . PAF (paroxysmal atrial fibrillation) ([place])   . Pinched nerve in neck   . Sleep apnea    wears CPAP  . T12 compression fracture ([place]) 11/2015   . Vitamin D deficiency   . Whiplash injury 06/07/2010   Past Surgical History:  Procedure Laterality Date  . APPENDECTOMY  1990s  . ATRIAL FIBRILLATION ABLATION N/A 03/25/2016   Procedure: Atrial Fibrillation Ablation;  Surgeon: [name], MD;  Location: [place] [facility];  Service: Cardiovascular;  Laterality: N/A;  . COLONOSCOPY    . FOREARM FRACTURE SURGERY Left ~ 02/2011   "broke arm; shattered wrist"  . FOREARM HARDWARE REMOVAL Left ~ 07/2011  . Spinal Nerve Ablation    . TEE WITHOUT CARDIOVERSION N/A 03/24/2016   Procedure: TRANSESOPHAGEAL ECHOCARDIOGRAM (TEE);  Surgeon: Sanda Klein, MD;  Location: [facility] ENDOSCOPY;  Service: Cardiovascular;  Laterality: N/A;    Current Outpatient Medications  Medication Sig Dispense Refill  . acetaminophen (TYLENOL) 650 MG CR tablet Take 650-1,300 mg by mouth every 8 (eight) hours as needed for pain.     Marland Kitchen amLODipine (NORVASC) 10 MG tablet TAKE 1 TABLET BY MOUTH EVERY DAY (Patient taking differently: 5 mg. Taking 1/2 tablet daily) 90 tablet 1  . carvedilol (COREG) 12.5 MG tablet Take 1 tablet (12.5 mg total) by mouth 2 (two) times daily. 180 tablet 3  . cholecalciferol (VITAMIN D) 1000 units tablet Take 1,000 Units by mouth daily.    . DULoxetine (CYMBALTA) 30 MG capsule Take 1 capsule (30 mg total) by mouth daily. 90 capsule 1  . flecainide (TAMBOCOR)  50 MG tablet Take 2 tablets by mouth daily as needed for breakthrough afib. 60 tablet 2  . LORazepam (ATIVAN) 0.5 MG tablet Take 1 tablet (0.5 mg total) by mouth 2 (two) times daily as needed for anxiety. 30 tablet 0  . XARELTO 20 MG TABS tablet TAKE 1 TABLET BY MOUTH EVERY DAY 30 tablet 6   No current facility-administered medications for this encounter.    Allergies  Allergen Reactions  . Ace Inhibitors Cough  . Hctz [Hydrochlorothiazide] Other (See Comments)    Caused drug-induced LUPUS  . Oxycodone Other (See Comments)    Hallucinations  . Prednisone Other (See Comments)    Made  patient very aggressive  . Sulfa Antibiotics Nausea And Vomiting  . Sulfur Nausea And Vomiting  . Voltaren [Diclofenac Sodium] Other (See Comments)    Made patient become aggressive    Social History   Socioeconomic History  . Marital status: Married    Spouse name: Not on file  . Number of children: 2  . Years of education: Not on file  . Highest education level: Not on file  Occupational History  . Occupation: retired  Tobacco Use  . Smoking status: Former Smoker    Packs/day: 0.50    Years: 44.00    Pack years: 22.00    Types: Cigarettes, E-cigarettes    Quit date: 09/06/2013    Years since quitting: 5.4  . Smokeless tobacco: Never Used  Substance and Sexual Activity  . Alcohol use: Yes    Comment: 03/25/2016 "nothing for a couple years now; was having a drink on anniversary and Christmas"  . Drug use: No  . Sexual activity: Not Currently  Other Topics Concern  . Not on file  Social History Narrative   ** Merged History Encounter **       Social Determinants of Health   Financial Resource Strain:   . Difficulty of Paying Living Expenses: Not on file  Food Insecurity:   . Worried About Charity fundraiser in the Last Year: Not on file  . Ran Out of Food in the Last Year: Not on file  Transportation Needs:   . Lack of Transportation (Medical): Not on file  . Lack of Transportation (Non-Medical): Not on file  Physical Activity:   . Days of Exercise per Week: Not on file  . Minutes of Exercise per Session: Not on file  Stress:   . Feeling of Stress : Not on file  Social Connections:   . Frequency of Communication with Friends and Family: Not on file  . Frequency of Social Gatherings with Friends and Family: Not on file  . Attends Religious Services: Not on file  . Active Member of Clubs or Organizations: Not on file  . Attends Archivist Meetings: Not on file  . Marital Status: Not on file  Intimate Partner Violence:   . Fear of Current or Ex-Partner:  Not on file  . Emotionally Abused: Not on file  . Physically Abused: Not on file  . Sexually Abused: Not on file    Family History  Problem Relation Age of Onset  . Lung cancer Mother   . Stroke Father   . Hypertension Father   . Heart disease Father   . Hypertension Maternal Grandmother   . Stroke Maternal Grandfather   . Heart disease Maternal Grandfather   . Diabetes Paternal Grandmother   . Heart disease Paternal Grandmother   . Diabetes Paternal Grandfather   . Bipolar disorder  Daughter   . Other Daughter        fatty liver  . Other Son        fattye liver, born with 1 kidney  . Colon cancer Neg Hx   . Esophageal cancer Neg Hx   . Rectal cancer Neg Hx   . Stomach cancer Neg Hx     ROS- All systems are reviewed and negative except as per the HPI above  Physical Exam: Vitals:   02/23/19 1346  BP: 140/76  Pulse: (!) 58  Weight: 102 kg  Height: 5\' 6"  (1.676 m)   Wt Readings from Last 3 Encounters:  02/23/19 102 kg  12/20/18 100.7 kg  10/12/18 100.7 kg    Labs: Lab Results  Component Value Date   NA 138 10/12/2018   K 4.3 10/12/2018   CL 102 10/12/2018   CO2 29 10/12/2018   GLUCOSE 97 10/12/2018   BUN 14 10/12/2018   CREATININE 0.78 10/12/2018   CALCIUM 9.5 10/12/2018   PHOS 4.6 11/26/2015   MG 2.1 12/08/2015   Lab Results  Component Value Date   INR 1.22 04/03/2016   Lab Results  Component Value Date   CHOL 178 10/12/2018   HDL 44.30 10/12/2018   LDLCALC 103 (H) 10/12/2018   TRIG 154.0 (H) 10/12/2018     GEN- The patient is well appearing, alert and oriented x 3 today.   Head- normocephalic, atraumatic Eyes-  Sclera clear, conjunctiva pink Ears- hearing intact Oropharynx- clear Neck- supple, no JVP Lymph- no cervical lymphadenopathy Lungs- Clear to ausculation bilaterally, normal work of breathing Heart- slow, regular rate and rhythm, no murmurs, rubs or gallops, PMI not laterally displaced GI- soft, NT, ND, + BS Extremities- no  clubbing, cyanosis, or edema MS- no significant deformity or atrophy Skin- no rash or lesion Psych- euthymic mood, full affect Neuro- strength and sensation are intact  EKG- Sinus brady at 58 bpm, pr int 112 ms, qrs int 78 ms, qtc 429 ms  Epic records reviewed    Assessment and Plan: 1. 2 Episodes of slow HR/BP  Discussed with her having a Linq implanted as Dr. Rayann Heman had previously recommended  She would like to proceed with this   2. Afib  Appears to be quiet  No change in approach  3. Shortness of breath with exertion  Pt has noted increase in  this in the last 6 months Prior TEE  03/2016 showed mild to moderate MR  Repeat echo lexi Myoview to assess for ischemia  Tsh/cbc/cmet today   4. CHA2DS2VASc score of 4 Continue xarelto 20 mg daily  5. HTN Stable today   Geroge Baseman. Knoah Nedeau, [facility] [address] [place], [place] [address] [phone]

## 2019-02-23 NOTE — Patient Instructions (Signed)
Scheduling will contact you for appointment with Dr. Rayann Heman for linq placement  Scheduling will contact you for echocardiogram and stress testing

## 2019-02-25 MED ORDER — AMLODIPINE BESYLATE 10 MG PO TABS: ORAL_TABLET | ORAL | Status: DC

## 2019-02-25 NOTE — Addendum Note (Signed)
Encounter addended by: Enid Derry, CMA on: 02/25/2019 10:49 AM  Actions taken: Order Reconciliation Section accessed, Order list changed

## 2019-02-28 ENCOUNTER — Ambulatory Visit: Payer: Medicare Other | Admitting: Internal Medicine

## 2019-03-07 ENCOUNTER — Other Ambulatory Visit: Payer: Self-pay

## 2019-03-07 ENCOUNTER — Ambulatory Visit (INDEPENDENT_AMBULATORY_CARE_PROVIDER_SITE_OTHER): Payer: Medicare Other | Admitting: Internal Medicine

## 2019-03-07 ENCOUNTER — Encounter: Payer: Self-pay | Admitting: Internal Medicine

## 2019-03-07 VITALS — BP 138/72 | HR 70 | Ht 67.0 in | Wt 224.0 lb

## 2019-03-07 DIAGNOSIS — I10 Essential (primary) hypertension: Secondary | ICD-10-CM | POA: Diagnosis not present

## 2019-03-07 DIAGNOSIS — I480 Paroxysmal atrial fibrillation: Secondary | ICD-10-CM

## 2019-03-07 DIAGNOSIS — R002 Palpitations: Secondary | ICD-10-CM | POA: Diagnosis not present

## 2019-03-07 DIAGNOSIS — G4733 Obstructive sleep apnea (adult) (pediatric): Secondary | ICD-10-CM

## 2019-03-07 HISTORY — PX: OTHER SURGICAL HISTORY: SHX169

## 2019-03-07 NOTE — Progress Notes (Signed)
PCP: Brunetta Jeans, PA-C  Primary EP: Dr Brien Few Elefteria Mungovan is a 69 y.o. female who presents today for routine electrophysiology followup.  Since last being seen in our clinic, the patient reports doing reasonably well.  She has been having palpitations of unclear etiology.  She is not certain as to whether this is afib or not. She also has postural dizziness associated with low BP. Today, she denies symptoms of chest pain, shortness of breath,  lower extremity edema, presyncope, or syncope.  The patient is otherwise without complaint today.   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 HTN   Orthostatic hypotension    OSA on CPAP    Osteopenia    PAF (paroxysmal atrial fibrillation) ([place])    Pinched nerve in neck    Sleep apnea    wears CPAP   T12 compression fracture ([place]) 11/2015   Vitamin D deficiency    Whiplash injury 06/07/2010   Past Surgical History:  Procedure Laterality Date   APPENDECTOMY  1990s   ATRIAL FIBRILLATION ABLATION N/A 03/25/2016   Procedure: Atrial Fibrillation Ablation;  Surgeon: [name], MD;  Location: [place] [facility];  Service: Cardiovascular;  Laterality: N/A;   COLONOSCOPY     FOREARM FRACTURE SURGERY Left ~ 02/2011   "broke arm; shattered wrist"   FOREARM HARDWARE REMOVAL Left ~ 07/2011   Spinal Nerve Ablation     TEE WITHOUT CARDIOVERSION N/A 03/24/2016   Procedure: TRANSESOPHAGEAL ECHOCARDIOGRAM (TEE);  Surgeon: Sanda Klein, MD;  Location: [facility] ENDOSCOPY;  Service: Cardiovascular;  Laterality: N/A;    ROS- all systems are reviewed and negatives except as per HPI above  Current Outpatient Medications  Medication Sig Dispense Refill   acetaminophen (TYLENOL) 650 MG CR tablet Take 650-1,300 mg by mouth every 8 (eight) hours as needed for pain.      amLODipine (NORVASC) 10 MG tablet Taking by mouth 1/2 tablet daily     carvedilol (COREG) 12.5 MG tablet Take 1 tablet (12.5 mg total) by mouth 2 (two) times daily. 180 tablet 3   cholecalciferol (VITAMIN D) 1000 units tablet Take 1,000 Units by mouth daily.     DULoxetine (CYMBALTA) 30 MG capsule Take 1 capsule (30 mg total) by mouth daily. 90 capsule 1   flecainide (TAMBOCOR) 50 MG tablet Take 2 tablets by mouth daily as needed for breakthrough afib. 60 tablet 2   LORazepam (ATIVAN) 0.5 MG tablet Take 1 tablet (0.5 mg total) by mouth 2 (two) times daily as needed for anxiety. 30 tablet 0   XARELTO 20 MG TABS tablet TAKE 1 TABLET BY MOUTH EVERY DAY 30 tablet 6   No current facility-administered medications for this visit.    Physical Exam: Vitals:   03/07/19 1259  BP: 138/72  Pulse: 70  SpO2: 96%  Weight: 224 lb (101.6 kg)  Height: 5\' 7"  (1.702 m)    GEN- The patient is well appearing, alert and oriented x 3 today.   Head- normocephalic, atraumatic Eyes-  Sclera clear, conjunctiva pink Ears- hearing intact Oropharynx- clear Lungs-   normal work of breathing Heart- Regular rate and rhythm  GI- soft  Extremities- no clubbing, cyanosis, or edema  Wt Readings from Last 3 Encounters:  03/07/19 224 lb (101.6 kg)  02/23/19 224 lb 12.8 oz (102 kg)  12/20/18 222 lb (100.7 kg)    Assessment and Plan:  1. Paroxysmal atrial fibrillation The patient has doing well with flecainide. She is s/p prior ablation.  She has palpitations of unclear significance.  She may be willing to  consider repeat ablation if she is noted to have increased AF by [name].  Prior short term monitoring has not be helpful.   I would therefore advise implantation of an implantable loop recorder for long term arrhythmia monitoring.  Risks and benefits to [name] were discussed at length with the patient today, including but not limited to risks of bleeding and infection.  Extensive device education was performed.  Remote monitoring was also discussed at length today.  The patient understands and wishes to proceed.  We will proceed at this time with [name] implantation.  Continue xarelto for chads2vasc score of 4.  2. OSA Compliant with CPAP  3. HTN She has had low BPs, primarily with change in posture Consider reducing rate control medicines depending on findings of [name]   [name] MD, [facility] 03/07/2019 4:25 PM      DESCRIPTION OF PROCEDURE:  Informed written consent was obtained.  The patient required no sedation for the procedure today.  The patients left chest was prepped and draped. Mapping over the patient's chest was performed to identify the appropriate [name] site.  This area was found to be the left parasternal region over the 3rd-4th intercostal space.  The skin overlying this region was infiltrated with lidocaine for local analgesia.  A 0.5-cm incision was made at the implant site.  A subcutaneous [name] pocket was fashioned using a combination of sharp and blunt dissection.  A Medtronic Reveal Linq model LNQ 22 implantable loop recorder [id number]) was then placed into the pocket R waves were very prominent and measured > 0.2 mV. EBL<1 ml.  Steri- Strips and a sterile dressing were then applied.  There were no early apparent complications.     CONCLUSIONS:   1. Successful implantation of a Medtronic Reveal LINQ implantable loop recorder for further evaluation of palpitations and for afib management post ablation  2. No early apparent complications.   [name] MD, [facility] 03/07/2019 4:25 PM

## 2019-03-07 NOTE — Patient Instructions (Addendum)
Medication Instructions:  Your physician recommends that you continue on your current medications as directed. Please refer to the Current Medication list given to you today.  Labwork: None ordered.  Testing/Procedures: None ordered.  Follow-Up: Your physician wants you to follow-up in: 2 months with Dr. Rayann Heman with a virtual [place] visit.  May 09, 2019 at 2:00 pm    Any Other Special Instructions Will Be Listed Below (If Applicable).  If you need a refill on your cardiac medications before your next appointment, please call your pharmacy.    Implantable Loop Recorder Placement, Care After This sheet gives you information about how to care for yourself after your procedure. Your health care provider may also give you more specific instructions. If you have problems or questions, contact your health care provider. What can I expect after the procedure? After the procedure, it is common to have:  Soreness or discomfort near the incision.  Some swelling or bruising near the incision. Follow these instructions at home: Incision care   Follow instructions from your health care provider about how to take care of your incision. Make sure you:  You can remove your outer dressing after 24 hours ? Leave adhesive strips in place. These skin closures may need to stay in place for 2 weeks or longer. If adhesive strip edges start to loosen and curl up, you may trim the loose edges. Do not remove adhesive strips completely unless your health care provider tells you to do that.  Check your incision area every day for signs of infection. Check for: ? Redness, swelling, or pain. ? Fluid or blood. ? Warmth. ? Pus or a bad smell.  Do not take baths, swim, or use a hot tub until your health care provider approves. Ask your health care provider if you can take showers. Activity   Return to your normal activities as told by your health care provider. Ask your health care provider  what activities are safe for you. General instructions  Follow instructions from your health care provider about how to manage your implantable loop recorder and transmit the information. Learn how to activate a recording if this is necessary for your type of device.  Do not go through a metal detection gate, and do not let someone hold a metal detector over your chest. Show your ID card.  Do not have an MRI unless you check with your health care provider first.  Take over-the-counter and prescription medicines only as told by your health care provider.  Keep all follow-up visits as told by your health care provider. This is important. Contact a health care provider if:  You have redness, swelling, or pain around your incision.  You have a fever.  You have pain that is not relieved by your pain medicine.  You have triggered your device because of fainting (syncope) or because of a heartbeat that feels like it is racing, slow, fluttering, or skipping (palpitations). Get help right away if you have:  Chest pain.  Difficulty breathing. Summary  After the procedure, it is common to have soreness or discomfort near the incision.  Change your dressing as told by your health care provider.  Follow instructions from your health care provider about how to manage your implantable loop recorder and transmit the information.  Keep all follow-up visits as told by your health care provider. This is important. This information is not intended to replace advice given to you by your health care provider. Make sure you discuss any  questions you have with your health care provider. Document Revised: 02/07/2017 Document Reviewed: 02/07/2017 Elsevier Patient Education  2020 [employer].

## 2019-03-08 ENCOUNTER — Telehealth (HOSPITAL_COMMUNITY): Payer: Self-pay | Admitting: *Deleted

## 2019-03-08 NOTE — Telephone Encounter (Signed)
Patient given detailed instructions per Myocardial Perfusion Study Information Sheet for the test on 03/11/19 at 10:15. Patient notified to arrive 15 minutes early and that it is imperative to arrive on time for appointment to keep from having the test rescheduled.  If you need to cancel or reschedule your appointment, please call the office within 24 hours of your appointment. . Patient verbalized understanding.Shannon [name]

## 2019-03-11 ENCOUNTER — Ambulatory Visit (HOSPITAL_COMMUNITY): Payer: Medicare Other | Attending: Internal Medicine

## 2019-03-11 ENCOUNTER — Ambulatory Visit (HOSPITAL_BASED_OUTPATIENT_CLINIC_OR_DEPARTMENT_OTHER): Payer: Medicare Other

## 2019-03-11 ENCOUNTER — Other Ambulatory Visit: Payer: Self-pay

## 2019-03-11 DIAGNOSIS — I480 Paroxysmal atrial fibrillation: Secondary | ICD-10-CM | POA: Diagnosis not present

## 2019-03-11 DIAGNOSIS — R0600 Dyspnea, unspecified: Secondary | ICD-10-CM | POA: Diagnosis not present

## 2019-03-11 DIAGNOSIS — R0609 Other forms of dyspnea: Secondary | ICD-10-CM

## 2019-03-11 LAB — MYOCARDIAL PERFUSION IMAGING
LV dias vol: 110 mL (ref 46–106)
LV sys vol: 57 mL
Peak HR: 83 {beats}/min
Rest HR: 65 {beats}/min
SDS: 0
SRS: 0
SSS: 0
TID: 0.96

## 2019-03-11 LAB — ECHOCARDIOGRAM COMPLETE
Height: 66 in
Weight: 3584 oz

## 2019-03-11 MED ORDER — TECHNETIUM TC 99M TETROFOSMIN IV KIT
10.9000 | PACK | Freq: Once | INTRAVENOUS | Status: AC | PRN
  Administered 2019-03-11: 10.9 via INTRAVENOUS
  Filled 2019-03-11: qty 11

## 2019-03-11 MED ORDER — TECHNETIUM TC 99M TETROFOSMIN IV KIT
31.9000 | PACK | Freq: Once | INTRAVENOUS | Status: AC | PRN
  Administered 2019-03-11: 31.9 via INTRAVENOUS
  Filled 2019-03-11: qty 32

## 2019-03-11 MED ORDER — REGADENOSON 0.4 MG/5ML IV SOLN
0.4000 mg | Freq: Once | INTRAVENOUS | Status: AC
  Administered 2019-03-11: 0.4 mg via INTRAVENOUS

## 2019-03-14 ENCOUNTER — Encounter (HOSPITAL_COMMUNITY): Payer: Self-pay | Admitting: *Deleted

## 2019-03-20 DIAGNOSIS — Z23 Encounter for immunization: Secondary | ICD-10-CM | POA: Diagnosis not present

## 2019-04-07 ENCOUNTER — Ambulatory Visit (INDEPENDENT_AMBULATORY_CARE_PROVIDER_SITE_OTHER): Payer: Medicare Other | Admitting: *Deleted

## 2019-04-07 ENCOUNTER — Other Ambulatory Visit: Payer: Self-pay

## 2019-04-07 DIAGNOSIS — Z950 Presence of cardiac pacemaker: Secondary | ICD-10-CM | POA: Diagnosis not present

## 2019-04-07 LAB — CUP PACEART REMOTE DEVICE CHECK
Date Time Interrogation Session: 20210401170239
Implantable Pulse Generator Implant Date: 20210301

## 2019-04-08 NOTE — Progress Notes (Signed)
[name] Remote 

## 2019-04-14 ENCOUNTER — Other Ambulatory Visit: Payer: Self-pay | Admitting: Internal Medicine

## 2019-04-14 ENCOUNTER — Other Ambulatory Visit: Payer: Self-pay | Admitting: Physician Assistant

## 2019-04-14 NOTE — Telephone Encounter (Signed)
Last OV 03/07/19 Scr 0.73 on 02/23/19 ABW crcl 119

## 2019-04-26 ENCOUNTER — Telehealth: Payer: Self-pay | Admitting: Physician Assistant

## 2019-04-26 NOTE — Progress Notes (Signed)
  Chronic Care Management   Note  04/26/2019 Name: Shannon [name] MRN: [id number] DOB: [birth year]  Shannon [name] is a 69 y.o. year old female who is a primary care patient of Delorse Limber. I reached out to [name] by phone today in response to a referral sent by [name]'s PCP, Brunetta Jeans, PA-C.   [name] was given information about Chronic Care Management services today including:  1. CCM service includes personalized support from designated clinical staff supervised by her physician, including individualized plan of care and coordination with other care providers 2. 24/7 contact phone numbers for assistance for urgent and routine care needs. 3. Service will only be billed when office clinical staff spend 20 minutes or more in a month to coordinate care. 4. Only one practitioner may furnish and bill the service in a calendar month. 5. The patient may stop CCM services at any time (effective at the end of the month) by phone call to the office staff.   Patient agreed to services and verbal consent obtained.   Follow up plan:   Earney Hamburg [employer] Scheduler

## 2019-04-26 NOTE — Chronic Care Management (AMB) (Signed)
Patient agreed to recorded call and understands Medicare deductible.

## 2019-04-28 ENCOUNTER — Ambulatory Visit: Payer: Medicare Other | Admitting: Internal Medicine

## 2019-05-07 LAB — CUP PACEART REMOTE DEVICE CHECK
Date Time Interrogation Session: 20210501001455
Implantable Pulse Generator Implant Date: 20210301

## 2019-05-09 ENCOUNTER — Encounter: Payer: Self-pay | Admitting: Internal Medicine

## 2019-05-09 ENCOUNTER — Telehealth: Payer: Self-pay

## 2019-05-09 ENCOUNTER — Ambulatory Visit (INDEPENDENT_AMBULATORY_CARE_PROVIDER_SITE_OTHER): Payer: Medicare Other | Admitting: *Deleted

## 2019-05-09 ENCOUNTER — Telehealth (INDEPENDENT_AMBULATORY_CARE_PROVIDER_SITE_OTHER): Payer: Medicare Other | Admitting: Internal Medicine

## 2019-05-09 ENCOUNTER — Other Ambulatory Visit: Payer: Self-pay

## 2019-05-09 VITALS — BP 143/75 | HR 55 | Ht 66.0 in | Wt 222.0 lb

## 2019-05-09 DIAGNOSIS — G4733 Obstructive sleep apnea (adult) (pediatric): Secondary | ICD-10-CM

## 2019-05-09 DIAGNOSIS — I480 Paroxysmal atrial fibrillation: Secondary | ICD-10-CM | POA: Diagnosis not present

## 2019-05-09 DIAGNOSIS — R0600 Dyspnea, unspecified: Secondary | ICD-10-CM

## 2019-05-09 DIAGNOSIS — R0609 Other forms of dyspnea: Secondary | ICD-10-CM

## 2019-05-09 DIAGNOSIS — I4811 Longstanding persistent atrial fibrillation: Secondary | ICD-10-CM

## 2019-05-09 DIAGNOSIS — D6869 Other thrombophilia: Secondary | ICD-10-CM

## 2019-05-09 DIAGNOSIS — R0602 Shortness of breath: Secondary | ICD-10-CM

## 2019-05-09 LAB — CUP PACEART REMOTE DEVICE CHECK
Date Time Interrogation Session: 20210502170326
Implantable Pulse Generator Implant Date: 20210301

## 2019-05-09 MED ORDER — CARVEDILOL 6.25 MG PO TABS: 6.2500 mg | ORAL_TABLET | Freq: Two times a day (BID) | ORAL | 3 refills | Status: DC

## 2019-05-09 NOTE — Telephone Encounter (Signed)
Order placed for PFT's.  Updated coreg on med list.  Cancelled June appt with afib clinic.

## 2019-05-09 NOTE — Telephone Encounter (Signed)
-----   Message from [name], MD sent at 05/09/2019  2:12 PM EDT ----- Cancel AF clinic appointment for June.  Follow-up in AF Clinic in 3 months,  I will see in 6 months   PFTs with spirometry to evaluate SOB  Reduce coreg to 6.25mg  BID
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
## 2019-05-10 NOTE — Progress Notes (Signed)
Carelink Summary Report / Loop Recorder 

## 2019-05-12 ENCOUNTER — Telehealth (HOSPITAL_COMMUNITY): Payer: Self-pay | Admitting: *Deleted

## 2019-05-12 NOTE — Telephone Encounter (Signed)
Patient called in stating about 2am she felt herself go into AF HR in the 70s. She took her normal medications around 1030 and thinks things have settled down. Worried this is a result of decreasing coreg earlier this week per Dr. Rayann Heman recommendations. Pt is feeling much better since reducing coreg shortness of breath wise. Pt will keep track of her episodes if increase she will call back as meds may need to be changed since she doesn't tolerate higher doses of BB. Pt verbalized understanding.

## 2019-05-16 ENCOUNTER — Telehealth: Payer: Self-pay

## 2019-05-16 NOTE — Telephone Encounter (Signed)
Error

## 2019-05-18 ENCOUNTER — Other Ambulatory Visit: Payer: Self-pay | Admitting: General Practice

## 2019-05-18 DIAGNOSIS — I10 Essential (primary) hypertension: Secondary | ICD-10-CM

## 2019-05-18 DIAGNOSIS — M8580 Other specified disorders of bone density and structure, unspecified site: Secondary | ICD-10-CM

## 2019-05-18 DIAGNOSIS — F419 Anxiety disorder, unspecified: Secondary | ICD-10-CM

## 2019-06-08 ENCOUNTER — Telehealth: Payer: Self-pay

## 2019-06-08 NOTE — Telephone Encounter (Signed)
Received [name] alert for 3 symptoms, 2 AF alerts longest 6 hr 50 min., w/ VR 120-140's. Known PAF. Per med list currently taking Coreg 6.25 mg BID, Xarelto 20 mg daily.   Called patient to assess symptoms. No answer, LMOVM.

## 2019-06-08 NOTE — Telephone Encounter (Signed)
Called patient to assess, patient reports she kept her 3 grandchildren for 3 days prior to onset on AF. Patient states she woke up 06/07/19 felt fatigued and "out of it", reports of going back to sleep waking up later then knowing she was in AF. Patient states she took her nighttime medications including Coreg 1.25 mg BID and Xarolto. Patient reports her Coreg dose was lowered and was told be Dr. Rayann Heman she may have some break through AF episodes. Patient states she feels like she is back in regular rhythm today and feels much better. Advised patient to call DC back if she has any questions or concerns, verbalizes understanding.

## 2019-06-08 NOTE — Telephone Encounter (Signed)
The pt was returning the [facility] phone call. I told her the nurse is with a pt and will return her call as soon as she can.

## 2019-06-09 ENCOUNTER — Telehealth: Payer: Self-pay

## 2019-06-09 ENCOUNTER — Ambulatory Visit (INDEPENDENT_AMBULATORY_CARE_PROVIDER_SITE_OTHER): Payer: Medicare Other | Admitting: *Deleted

## 2019-06-09 ENCOUNTER — Ambulatory Visit: Payer: Medicare Other

## 2019-06-09 DIAGNOSIS — I4811 Longstanding persistent atrial fibrillation: Secondary | ICD-10-CM

## 2019-06-09 DIAGNOSIS — F419 Anxiety disorder, unspecified: Secondary | ICD-10-CM

## 2019-06-09 DIAGNOSIS — I10 Essential (primary) hypertension: Secondary | ICD-10-CM

## 2019-06-09 LAB — CUP PACEART REMOTE DEVICE CHECK
Date Time Interrogation Session: 20210602170509
Implantable Pulse Generator Implant Date: 20210301

## 2019-06-09 NOTE — Telephone Encounter (Signed)
Called patient to inform of PCP's recommendations. Patient scheduled an appointment. Unable to schedule a wellness visit until after 10/12/19.

## 2019-06-09 NOTE — Telephone Encounter (Signed)
Patient is requesting refill on lorazepam 0.5 mg tablet. Preferred pharmacy is [id number] - [place], [place].

## 2019-06-09 NOTE — Telephone Encounter (Signed)
Patient will need follow up before additional refills of the alprazolam can be sent in. Been almost a year since I've seen her

## 2019-06-09 NOTE — Progress Notes (Signed)
Chronic Care Management Pharmacy  Name: Shannon [name]  MRN: [id number] DOB: [birth year]  Chief Complaint/ HPI  Shannon [name],  69 y.o. , female presents for their Initial CCM visit with the clinical pharmacist via telephone due to COVID-19 Pandemic.  PCP : Shannon Jeans, PA-C  Their chronic conditions include:  Encounter Diagnoses  Name Primary?  . Essential hypertension Yes  . Anxiety and depression   . Longstanding persistent atrial fibrillation ([place])    Consult Visit: 05/12/2019 (Afib clinic): c/o AF, feeling better from sob standpoint, difficulty tolerating higher doses of BB 03/07/2019 (Dr. Rayann Heman, Cardiology): Postural dizziness associated with low BP, palpitations of unclear etiology. Advised implantation of an implantable loop recorder for long term arrhythmia monitoring - patient wanting to move forward. Continue xarelto for chads2vasc 4.  Coreg dose decreased.   Patient Active Problem List   Diagnosis Date Noted  . Palpitations 03/07/2019  . Morbid obesity ([place]) 09/11/2017  . Chronic fatigue 05/27/2016  . A-fib ([place]) 03/25/2016  . Typical atrial flutter ([place])   . Chronic diastolic CHF (congestive heart failure) ([place]) 12/08/2015  . Thoracic compression fracture ([place]) 11/16/2015  . Orthostatic hypotension 11/15/2015  . Osteopenia 09/12/2015  . Vitamin D deficiency 08/29/2015  . OSA (obstructive sleep apnea) 03/04/2015  . Polymyalgia ([place]) 11/22/2014  . Essential hypertension 08/21/2014  . Anxiety and depression 08/21/2014  . Hip stiffness 08/21/2014   Past Surgical History:  Procedure Laterality Date  . APPENDECTOMY  1990s  . ATRIAL FIBRILLATION ABLATION N/A 03/25/2016   Procedure: Atrial Fibrillation Ablation;  Surgeon: [name], MD;  Location: [facility];  Service: Cardiovascular;  Laterality: N/A;  . COLONOSCOPY    . FOREARM FRACTURE SURGERY Left ~ 02/2011   "broke arm; shattered wrist"  . FOREARM HARDWARE REMOVAL Left ~ 07/2011  .  implantable loop recorder placement  03/07/2019   Medtronic Reveal Linq model LNQ 22 implantable loop recorder [id number]) implanted by Dr Rayann Heman for Afib management  . Spinal Nerve Ablation    . TEE WITHOUT CARDIOVERSION N/A 03/24/2016   Procedure: TRANSESOPHAGEAL ECHOCARDIOGRAM (TEE);  Surgeon: Sanda Klein, MD;  Location: [facility]COPY;  Service: Cardiovascular;  Laterality: N/A;   Social History   Socioeconomic History  . Marital status: Married    Spouse name: Not on file  . Number of children: 2  . Years of education: Not on file  . Highest education level: Not on file  Occupational History  . Occupation: retired  Tobacco Use  . Smoking status: Former Smoker    Packs/day: 0.50    Years: 44.00    Pack years: 22.00    Types: Cigarettes, E-cigarettes    Quit date: 09/06/2013    Years since quitting: 5.7  . Smokeless tobacco: Never Used  Substance and Sexual Activity  . Alcohol use: Yes    Comment: 03/25/2016 "nothing for a couple years now; was having a drink on anniversary and Christmas"  . Drug use: No  . Sexual activity: Not Currently  Other Topics Concern  . Not on file  Social History Narrative   ** Merged History Encounter **       Social Determinants of Health   Financial Resource Strain:   . Difficulty of Paying Living Expenses:   Food Insecurity:   . Worried About Charity fundraiser in the Last Year:   . Arboriculturist in the Last Year:   Transportation Needs:   . Film/video editor (Medical):   Marland Kitchen Lack  of Transportation (Non-Medical):   Physical Activity:   . Days of Exercise per Week:   . Minutes of Exercise per Session:   Stress:   . Feeling of Stress :   Social Connections:   . Frequency of Communication with Friends and Family:   . Frequency of Social Gatherings with Friends and Family:   . Attends Religious Services:   . Active Member of Clubs or Organizations:   . Attends Archivist Meetings:   Marland Kitchen Marital Status:    Family  History  Problem Relation Age of Onset  . Lung cancer Mother   . Stroke Father   . Hypertension Father   . Heart disease Father   . Hypertension Maternal Grandmother   . Stroke Maternal Grandfather   . Heart disease Maternal Grandfather   . Diabetes Paternal Grandmother   . Heart disease Paternal Grandmother   . Diabetes Paternal Grandfather   . Bipolar disorder Daughter   . Other Daughter        fatty liver  . Other Son        fattye liver, born with 1 kidney  . Colon cancer Neg Hx   . Esophageal cancer Neg Hx   . Rectal cancer Neg Hx   . Stomach cancer Neg Hx    Allergies  Allergen Reactions  . Ace Inhibitors Cough  . Hctz [Hydrochlorothiazide] Other (See Comments)    Caused drug-induced LUPUS  . Oxycodone Other (See Comments)    Hallucinations  . Prednisone Other (See Comments)    Made patient very aggressive  . Sulfa Antibiotics Nausea And Vomiting  . Sulfur Nausea And Vomiting  . Voltaren [Diclofenac Sodium] Other (See Comments)    Made patient become aggressive   Patient Care Team    Relationship Specialty Notifications Start End  Shannon Jeans, PA-C PCP - General Family Medicine  11/21/15   Madelin Rear, [facility] Pharmacist Pharmacist  04/26/19    Comment: PHONE NUMBER [phone]   Outpatient Encounter Medications as of 06/09/2019  Medication Sig  . acetaminophen (TYLENOL) 650 MG CR tablet Take 650-1,300 mg by mouth every 8 (eight) hours as needed for pain.   Marland Kitchen amLODipine (NORVASC) 10 MG tablet Taking by mouth 1/2 tablet daily  . carvedilol (COREG) 6.25 MG tablet Take 1 tablet (6.25 mg total) by mouth 2 (two) times daily.  . cholecalciferol (VITAMIN D) 1000 units tablet Take 1,000 Units by mouth daily.  . DULoxetine (CYMBALTA) 30 MG capsule TAKE 1 CAPSULE BY MOUTH EVERY DAY  . flecainide (TAMBOCOR) 50 MG tablet Take 2 tablets by mouth daily as needed for breakthrough afib.  Marland Kitchen LORazepam (ATIVAN) 0.5 MG tablet Take 1 tablet (0.5 mg total) by mouth 2 (two) times  daily as needed for anxiety.  Alveda Reasons 20 MG TABS tablet TAKE 1 TABLET BY MOUTH EVERY DAY   No facility-administered encounter medications on file as of 06/09/2019.   Current Diagnosis/Assessment: Goals Addressed            This Visit's Progress   . PharmD Care Plan       CARE PLAN ENTRY  Current Barriers:  . Chronic Disease Management support, education, and care coordination needs related to Hypertension, Atrial Fibrillation, Depression, and Anxiety   Hypertension . Pharmacist Clinical Goal(s): o Over the next 90 days, patient will work with PharmD and providers to maintain BP goal <140/90 . Current regimen:  o Amlodipine 10 mg tablet - half tablet once daily  . Interventions: o Home bp  management . Patient self care activities - Over the next 90 days, patient will: o Check BP at least once every 1-2 weeks, document, and provide at future appointments o Ensure daily salt intake < 2300 mg/day AFib . Pharmacist Clinical Goal(s) o Over the next 90 days, patient will work with PharmD and providers to minimize symptoms of Afib . Current regimen:  o Xarelto 20 mg once daily o Carvedilol 6.25 mg twice daily o Flecainide 50 mg tablet - two tablets daily as needed for breakthrough afib . Interventions: o Continue current management . Patient self care activities - Over the next 90 days, patient will: o Continue current management  Anxiety/depression . Pharmacist Clinical Goal(s) o Over the next 90 days, patient will work with PharmD and providers to minimize symptoms of anxiety/depression . Current regimen:  . Lorazepam 0.5 mg twice daily as needed . Duloxetine 30 mg once daily  . Interventions: o Continue current management . Patient self care activities - Over the next 90 days, patient will: o Continue current management  Medication management . Pharmacist Clinical Goal(s): o Over the next 90 days, patient will work with PharmD and providers to maintain optimal  medication adherence . [facility] . Interventions o Comprehensive medication review performed. o Continue current medication management strategy . Patient self care activities - Over the next 90 days, patient will: o Focus on medication adherence by continuing current management o Take medications as prescribed o Report any questions or concerns to PharmD and/or provider(s) Initial goal documentation.      Hypertension   Denies dizziness, SOB, chest pain within last week. Expresses difficulty halving 10 mg amlodipine tablet to 5 mg daily. Patient checks BP at home daily. Readings at goal <140/90.   BP Readings from Last 3 Encounters:  05/09/19 (!) 143/75  03/07/19 138/72  02/23/19 140/76   Patient is currently controlled on the following medications:  . Amlodipine 10 mg tablet - half tablet (5 mg) once daily   Plan  Recommend stop amlodipine 10 mg tab as half tablet (5 mg) daily.  Recommend start 5 mg tab daily.  AFIB   Pulse Readings from Last 3 Encounters:  05/09/19 (!) 55  03/07/19 70  02/23/19 (!) 58   Patient is currently rate controlled. HR <70 BPM.  Patient is currently controlled on the following medications:  . Carvedilol 6.25 mg twice daily . Flecainide 50 mg tablet - two tablets daily as needed for breakthrough afib  Prevention of stroke in afib. CHA2DS2-VASc Score = 4. Denies any abnormal bruising, bleeding from nose or gums or blood in urine or stool. Patient is currently controlled on the following medications:  . Xarelto 20 mg once every night   Plan  Continue current management  Anxiety/Depression   Reports continued benefit from medications. Denies any side effects at this time. Patient is currently controlled on the following medications:  . Lorazepam 0.5 mg twice daily as needed . Duloxetine 30 mg once daily   Plan  Continue current medications.  Vaccines   Reviewed and discussed patient's vaccination history.     Immunization History  Administered Date(s) Administered  . Fluad Quad(high Dose 65+) 10/12/2018  . Influenza, High Dose Seasonal PF 11/20/2016  . Influenza,inj,Quad PF,6+ Mos 12/07/2014, 11/27/2015, 10/28/2017  . Influenza-Unspecified 10/06/2013  . Pneumococcal Conjugate-13 09/23/2011  . Pneumococcal Polysaccharide-23 12/08/2017  . Tdap 09/08/2006  . Zoster 09/23/2011   Plan  Recommended patient receive Shingrix and Tdap vaccine in pharmacy.   Medication Management  Denies any issues with current medication management. [facility] medications from:  [id number] [place], [address] [place] [address] Phone: [phone] Fax: [phone]   Plan  Continue current medication management strategy.  Follow up: 2 month phone visit. ______________ Visit Information SDOH (Social Determinants of Health) assessments performed: Yes.  Ms. Easterwood was given information about Chronic Care Management services today including:  1. CCM service includes personalized support from designated clinical staff supervised by her physician, including individualized plan of care and coordination with other care providers 2. 24/7 contact phone numbers for assistance for urgent and routine care needs. 3. Standard insurance, coinsurance, copays and deductibles apply for chronic care management only during months in which we provide at least 20 minutes of these services. Most insurances cover these services at 100%, however patients may be responsible for any copay, coinsurance and/or deductible if applicable. This service may help you avoid the need for more expensive face-to-face services. 4. Only one practitioner may furnish and bill the service in a calendar month. 5. The patient may stop CCM services at any time (effective at the end of the month) by phone call to the office staff.  Patient agreed to services and verbal consent obtained.    Madelin Rear, Pharm.D., BCGP Clinical Pharmacist [facility] at [facility] [phone]

## 2019-06-10 ENCOUNTER — Telehealth (INDEPENDENT_AMBULATORY_CARE_PROVIDER_SITE_OTHER): Payer: Medicare Other | Admitting: Physician Assistant

## 2019-06-10 ENCOUNTER — Encounter: Payer: Self-pay | Admitting: Physician Assistant

## 2019-06-10 ENCOUNTER — Other Ambulatory Visit: Payer: Self-pay

## 2019-06-10 DIAGNOSIS — F329 Major depressive disorder, single episode, unspecified: Secondary | ICD-10-CM | POA: Diagnosis not present

## 2019-06-10 DIAGNOSIS — F419 Anxiety disorder, unspecified: Secondary | ICD-10-CM

## 2019-06-10 MED ORDER — LORAZEPAM 0.5 MG PO TABS: 0.5000 mg | ORAL_TABLET | Freq: Two times a day (BID) | ORAL | 1 refills | Status: DC | PRN

## 2019-06-10 NOTE — Patient Instructions (Addendum)
Please call me at [phone] (direct line) with any questions - thank you!  - Edyth Gunnels., Clinical Pharmacist  Goals Addressed            This Visit's Progress   . PharmD Care Plan       CARE PLAN ENTRY  Current Barriers:  . Chronic Disease Management support, education, and care coordination needs related to Hypertension, Atrial Fibrillation, Depression, and Anxiety   Hypertension . Pharmacist Clinical Goal(s): o Over the next 90 days, patient will work with PharmD and providers to maintain BP goal <140/90 . Current regimen:  o Amlodipine 10 mg tablet - half tablet once daily  . Interventions: o Home bp management . Patient self care activities - Over the next 90 days, patient will: o Check BP at least once every 1-2 weeks, document, and provide at future appointments o Ensure daily salt intake < 2300 mg/day AFib . Pharmacist Clinical Goal(s) o Over the next 90 days, patient will work with PharmD and providers to minimize symptoms of Afib . Current regimen:  o Xarelto 20 mg once daily o Carvedilol 6.25 mg twice daily o Flecainide 50 mg tablet - two tablets daily as needed for breakthrough afib . Interventions: o Continue current management . Patient self care activities - Over the next 90 days, patient will: o Continue current management  Anxiety/depression . Pharmacist Clinical Goal(s) o Over the next 90 days, patient will work with PharmD and providers to minimize symptoms of anxiety/depression . Current regimen:  . Lorazepam 0.5 mg twice daily as needed . Duloxetine 30 mg once daily  . Interventions: o Continue current management . Patient self care activities - Over the next 90 days, patient will: o Continue current management  Medication management . Pharmacist Clinical Goal(s): o Over the next 90 days, patient will work with PharmD and providers to maintain optimal medication adherence . [facility] . Interventions o Comprehensive medication  review performed. o Continue current medication management strategy . Patient self care activities - Over the next 90 days, patient will: o Focus on medication adherence by continuing current management o Take medications as prescribed o Report any questions or concerns to PharmD and/or provider(s) Initial goal documentation.      Shannon [name] was given information about Chronic Care Management services today including:  1. CCM service includes personalized support from designated clinical staff supervised by her physician, including individualized plan of care and coordination with other care providers 2. 24/7 contact phone numbers for assistance for urgent and routine care needs. 3. Standard insurance, coinsurance, copays and deductibles apply for chronic care management only during months in which we provide at least 20 minutes of these services. Most insurances cover these services at 100%, however patients may be responsible for any copay, coinsurance and/or deductible if applicable. This service may help you avoid the need for more expensive face-to-face services. 4. Only one practitioner may furnish and bill the service in a calendar month. 5. The patient may stop CCM services at any time (effective at the end of the month) by phone call to the office staff.  Patient agreed to services and verbal consent obtained.   The patient verbalized understanding of instructions provided today and agreed to receive a mailed copy of patient instruction and/or educational materials. Telephone follow up appointment with pharmacy team member scheduled for: See next appointment with "Care Management Staff" under "What's Next" below.   Thank you!  Madelin Rear, Pharm.D., BCGP Clinical Pharmacist [facility]  at [facility] [phone]  Hypertension, Adult High blood pressure (hypertension) is when the force of blood pumping through the arteries is too strong. The arteries are the  blood vessels that carry blood from the heart throughout the body. Hypertension forces the heart to work harder to pump blood and may cause arteries to become narrow or stiff. Untreated or uncontrolled hypertension can cause a heart attack, heart failure, a stroke, kidney disease, and other problems. A blood pressure reading consists of a higher number over a lower number. Ideally, your blood pressure should be below 120/80. The first ("top") number is called the systolic pressure. It is a measure of the pressure in your arteries as your heart beats. The second ("bottom") number is called the diastolic pressure. It is a measure of the pressure in your arteries as the heart relaxes. What are the causes? The exact cause of this condition is not known. There are some conditions that result in or are related to high blood pressure. What increases the risk? Some risk factors for high blood pressure are under your control. The following factors may make you more likely to develop this condition:  Smoking.  Having type 2 diabetes mellitus, high cholesterol, or both.  Not getting enough exercise or physical activity.  Being overweight.  Having too much fat, sugar, calories, or salt (sodium) in your diet.  Drinking too much alcohol. Some risk factors for high blood pressure may be difficult or impossible to change. Some of these factors include:  Having chronic kidney disease.  Having a family history of high blood pressure.  Age. Risk increases with age.  Race. You may be at higher risk if you are African American.  Gender. Men are at higher risk than women before age 76. After age 61, women are at higher risk than men.  Having obstructive sleep apnea.  Stress. What are the signs or symptoms? High blood pressure may not cause symptoms. Very high blood pressure (hypertensive crisis) may cause:  Headache.  Anxiety.  Shortness of breath.  Nosebleed.  Nausea and vomiting.  Vision  changes.  Severe chest pain.  Seizures. How is this diagnosed? This condition is diagnosed by measuring your blood pressure while you are seated, with your arm resting on a flat surface, your legs uncrossed, and your feet flat on the floor. The cuff of the blood pressure monitor will be placed directly against the skin of your upper arm at the level of your heart. It should be measured at least twice using the same arm. Certain conditions can cause a difference in blood pressure between your right and left arms. Certain factors can cause blood pressure readings to be lower or higher than normal for a short period of time:  When your blood pressure is higher when you are in a health care provider's office than when you are at home, this is called white coat hypertension. Most people with this condition do not need medicines.  When your blood pressure is higher at home than when you are in a health care provider's office, this is called masked hypertension. Most people with this condition may need medicines to control blood pressure. If you have a high blood pressure reading during one visit or you have normal blood pressure with other risk factors, you may be asked to:  Return on a different day to have your blood pressure checked again.  Monitor your blood pressure at home for 1 week or longer. If you are diagnosed with  hypertension, you may have other blood or imaging tests to help your health care provider understand your overall risk for other conditions. How is this treated? This condition is treated by making healthy lifestyle changes, such as eating healthy foods, exercising more, and reducing your alcohol intake. Your health care provider may prescribe medicine if lifestyle changes are not enough to get your blood pressure under control, and if:  Your systolic blood pressure is above 130.  Your diastolic blood pressure is above 80. Your personal target blood pressure may vary depending  on your medical conditions, your age, and other factors. Follow these instructions at home: Eating and drinking   Eat a diet that is high in fiber and potassium, and low in sodium, added sugar, and fat. An example eating plan is called the DASH (Dietary Approaches to Stop Hypertension) diet. To eat this way: ? Eat plenty of fresh fruits and vegetables. Try to fill one half of your plate at each meal with fruits and vegetables. ? Eat whole grains, such as whole-wheat pasta, brown rice, or whole-grain bread. Fill about one fourth of your plate with whole grains. ? Eat or drink low-fat dairy products, such as skim milk or low-fat yogurt. ? Avoid fatty cuts of meat, processed or cured meats, and poultry with skin. Fill about one fourth of your plate with lean proteins, such as fish, chicken without skin, beans, eggs, or tofu. ? Avoid pre-made and processed foods. These tend to be higher in sodium, added sugar, and fat.  Reduce your daily sodium intake. Most people with hypertension should eat less than 1,500 mg of sodium a day.  Do not drink alcohol if: ? Your health care provider tells you not to drink. ? You are pregnant, may be pregnant, or are planning to become pregnant.  If you drink alcohol: ? Limit how much you use to:  0-1 drink a day for women.  0-2 drinks a day for men. ? Be aware of how much alcohol is in your drink. In the [place] one drink equals one 12 oz bottle of beer (355 mL), one 5 oz glass of wine (148 mL), or one 1 oz glass of hard liquor (44 mL). Lifestyle   Work with your health care provider to maintain a healthy body weight or to lose weight. Ask what an ideal weight is for you.  Get at least 30 minutes of exercise most days of the week. Activities may include walking, swimming, or biking.  Include exercise to strengthen your muscles (resistance exercise), such as Pilates or lifting weights, as part of your weekly exercise routine. Try to do these types of  exercises for 30 minutes at least 3 days a week.  Do not use any products that contain nicotine or tobacco, such as cigarettes, e-cigarettes, and chewing tobacco. If you need help quitting, ask your health care provider.  Monitor your blood pressure at home as told by your health care provider.  Keep all follow-up visits as told by your health care provider. This is important. Medicines  Take over-the-counter and prescription medicines only as told by your health care provider. Follow directions carefully. Blood pressure medicines must be taken as prescribed.  Do not skip doses of blood pressure medicine. Doing this puts you at risk for problems and can make the medicine less effective.  Ask your health care provider about side effects or reactions to medicines that you should watch for. Contact a health care provider if you:  Think you are  having a reaction to a medicine you are taking.  Have headaches that keep coming back (recurring).  Feel dizzy.  Have swelling in your ankles.  Have trouble with your vision. Get help right away if you:  Develop a severe headache or confusion.  Have unusual weakness or numbness.  Feel faint.  Have severe pain in your chest or abdomen.  Vomit repeatedly.  Have trouble breathing. Summary  Hypertension is when the force of blood pumping through your arteries is too strong. If this condition is not controlled, it may put you at risk for serious complications.  Your personal target blood pressure may vary depending on your medical conditions, your age, and other factors. For most people, a normal blood pressure is less than 120/80.  Hypertension is treated with lifestyle changes, medicines, or a combination of both. Lifestyle changes include losing weight, eating a healthy, low-sodium diet, exercising more, and limiting alcohol. This information is not intended to replace advice given to you by your health care provider. Make sure you  discuss any questions you have with your health care provider. Document Revised: 09/02/2017 Document Reviewed: 09/02/2017 Elsevier Patient Education  2020 [employer].

## 2019-06-10 NOTE — Progress Notes (Signed)
Virtual Visit via Video   I connected with patient on 06/10/19 at  3:30 PM EDT by a video enabled telemedicine application and verified that I am speaking with the correct person using two identifiers.  Location patient: Home Location provider: [facility], Office Persons participating in the virtual visit: Patient, Provider, [place] (Patina Moore)  I discussed the limitations of evaluation and management by telemedicine and the availability of in person appointments. The patient expressed understanding and agreed to proceed.  Subjective:   HPI:   Patient presents via [place] today for follow-up of anxiety and mood. Is currently on a regimen of duloxetine 30 mg once daily and lorazepam as needed.  Is taking the duloxetine daily as directed.  Noting stable mood overall.  Anxiety is well controlled.  Will only occasionally have more acute anxiety, previously centered around her cardiovascular health.  Will take lorazepam if she is feeling really stressed but does not remember the last time she took it.  Her current prescription is expiring and she needs a refill of medication.  Denies new concerns today  ROS:   See pertinent positives and negatives per HPI.  Patient Active Problem List   Diagnosis Date Noted  . Palpitations 03/07/2019  . Morbid obesity ([place]) 09/11/2017  . Chronic fatigue 05/27/2016  . A-fib ([place]) 03/25/2016  . Typical atrial flutter ([place])   . Chronic diastolic CHF (congestive heart failure) ([place]) 12/08/2015  . Thoracic compression fracture ([place]) 11/16/2015  . Orthostatic hypotension 11/15/2015  . Osteopenia 09/12/2015  . Vitamin D deficiency 08/29/2015  . OSA (obstructive sleep apnea) 03/04/2015  . Polymyalgia ([place]) 11/22/2014  . Essential hypertension 08/21/2014  . Anxiety and depression 08/21/2014  . Hip stiffness 08/21/2014    Social History   Tobacco Use  . Smoking status: Former Smoker    Packs/day: 0.50    Years: 44.00    Pack years:  22.00    Types: Cigarettes, E-cigarettes    Quit date: 09/06/2013    Years since quitting: 5.7  . Smokeless tobacco: Never Used  Substance Use Topics  . Alcohol use: Yes    Comment: 03/25/2016 "nothing for a couple years now; was having a drink on anniversary and Christmas"    Current Outpatient Medications:  .  acetaminophen (TYLENOL) 650 MG CR tablet, Take 650-1,300 mg by mouth every 8 (eight) hours as needed for pain. , Disp: , Rfl:  .  amLODipine (NORVASC) 10 MG tablet, Taking by mouth 1/2 tablet daily, Disp: , Rfl:  .  carvedilol (COREG) 6.25 MG tablet, Take 1 tablet (6.25 mg total) by mouth 2 (two) times daily., Disp: 180 tablet, Rfl: 3 .  cholecalciferol (VITAMIN D) 1000 units tablet, Take 1,000 Units by mouth daily., Disp: , Rfl:  .  DULoxetine (CYMBALTA) 30 MG capsule, TAKE 1 CAPSULE BY MOUTH EVERY DAY, Disp: 90 capsule, Rfl: 1 .  flecainide (TAMBOCOR) 50 MG tablet, Take 2 tablets by mouth daily as needed for breakthrough afib., Disp: 60 tablet, Rfl: 2 .  loratadine (CLARITIN) 10 MG tablet, Take 10 mg by mouth daily., Disp: , Rfl:  .  LORazepam (ATIVAN) 0.5 MG tablet, Take 1 tablet (0.5 mg total) by mouth 2 (two) times daily as needed for anxiety., Disp: 30 tablet, Rfl: 0 .  triamcinolone (NASACORT ALLERGY 24HR) 55 MCG/ACT AERO nasal inhaler, Place 2 sprays into the nose daily., Disp: , Rfl:  .  XARELTO 20 MG TABS tablet, TAKE 1 TABLET BY MOUTH EVERY DAY, Disp: 90 tablet, Rfl: 1  Allergies  Allergen Reactions  . Ace Inhibitors Cough  . Hctz [Hydrochlorothiazide] Other (See Comments)    Caused drug-induced LUPUS  . Oxycodone Other (See Comments)    Hallucinations  . Prednisone Other (See Comments)    Made patient very aggressive  . Sulfa Antibiotics Nausea And Vomiting  . Sulfur Nausea And Vomiting  . Voltaren [Diclofenac Sodium] Other (See Comments)    Made patient become aggressive    Objective:   BP 136/72   Pulse 61   Wt 221 lb (100.2 kg)   BMI 35.67 kg/m    Patient is well-developed, well-nourished in no acute distress.  Resting comfortably at home.  Head is normocephalic, atraumatic.  No labored breathing.  Speech is clear and coherent with logical content.  Patient is alert and oriented at baseline.   Assessment and Plan:   1. Anxiety and depression Doing very well.  We will continue current regimen.  Refill of lorazepam sent to the pharmacy.  Will update CSC at her next in office visit (CPE in October) - LORazepam (ATIVAN) 0.5 MG tablet; Take 1 tablet (0.5 mg total) by mouth 2 (two) times daily as needed for anxiety.  Dispense: 30 tablet; Refill: 1 .   Leeanne Rio, PA-C 06/10/2019

## 2019-06-10 NOTE — Progress Notes (Signed)
I have discussed the procedure for the virtual visit with the patient who has given consent to proceed with assessment and treatment.   Delrick Dehart N Dalila Arca, LPN    . 

## 2019-06-13 ENCOUNTER — Ambulatory Visit
Admission: EM | Admit: 2019-06-13 | Discharge: 2019-06-13 | Disposition: A | Discharge status: Home / Self Care | Payer: Medicare Other | Attending: Emergency Medicine | Admitting: Emergency Medicine

## 2019-06-13 DIAGNOSIS — J069 Acute upper respiratory infection, unspecified: Secondary | ICD-10-CM

## 2019-06-13 MED ORDER — BENZONATATE 100 MG PO CAPS: 100.0000 mg | ORAL_CAPSULE | Freq: Three times a day (TID) | ORAL | 0 refills | Status: DC | PRN

## 2019-06-13 MED ORDER — MUCINEX 600 MG PO TB12: 1200.0000 mg | ORAL_TABLET | Freq: Two times a day (BID) | ORAL | 0 refills | Status: DC | PRN

## 2019-06-13 NOTE — Discharge Instructions (Signed)
Take the [facility] as needed for cough and the Mucinex as needed for congestion.    Follow up with your primary care provider if your symptoms are not improving.

## 2019-06-13 NOTE — ED Triage Notes (Signed)
Patient reports headaches, sinus pressure behind the eyes and forehead, ear itching and pain, and jaw tenderness. Reports she had both COVID vaccines and denies concern for COVID.
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
## 2019-06-14 ENCOUNTER — Encounter: Payer: Self-pay | Admitting: Physician Assistant

## 2019-06-14 NOTE — Progress Notes (Signed)
Carelink Summary Report / Loop Recorder 

## 2019-06-17 ENCOUNTER — Encounter: Payer: Self-pay | Admitting: Physician Assistant

## 2019-06-17 MED ORDER — DOXYCYCLINE HYCLATE 100 MG PO TABS: 100.0000 mg | ORAL_TABLET | Freq: Two times a day (BID) | ORAL | 0 refills | Status: DC

## 2019-06-21 ENCOUNTER — Telehealth: Payer: Self-pay | Admitting: *Deleted

## 2019-06-21 NOTE — Telephone Encounter (Signed)
LINQ alert received for AF events on 06/20/19, longest 9hr 25min. Correlating symptom episode reveals AF w/RVR. V rates overall poorly controlled, see 30 day Cardiac Compass trend below. AF burden 3.3% since 04/18/19. Carvedilol decreased to 6.25mg  BID on 05/09/19. On Xarelto and flecainide.  Reprogrammed LINQ alerts for AF rates/duration due to frequent alerts for known PAF. Routed to Dr. Rayann Heman for any additional recommendations.

## 2019-06-30 ENCOUNTER — Ambulatory Visit (HOSPITAL_COMMUNITY): Payer: Medicare Other | Admitting: Nurse Practitioner

## 2019-07-08 NOTE — Telephone Encounter (Signed)
Continue to monitor

## 2019-07-12 ENCOUNTER — Ambulatory Visit (INDEPENDENT_AMBULATORY_CARE_PROVIDER_SITE_OTHER): Payer: Medicare Other | Admitting: *Deleted

## 2019-07-12 DIAGNOSIS — I5032 Chronic diastolic (congestive) heart failure: Secondary | ICD-10-CM

## 2019-07-12 LAB — CUP PACEART REMOTE DEVICE CHECK
Date Time Interrogation Session: 20210705230212
Implantable Pulse Generator Implant Date: 20210301

## 2019-07-13 NOTE — Progress Notes (Signed)
Carelink Summary Report / Loop Recorder 

## 2019-08-01 ENCOUNTER — Telehealth: Payer: Self-pay

## 2019-08-01 NOTE — Telephone Encounter (Signed)
[facility] alert- Increase in AF burden.  Recent AF episodes with RVR at times.  Spoke with pt, she was aware of AF episodes last week.  She reports she was on vacation and routine was off.  She did take extra Carvedilol on 7/19 and 7/21, but on Friday she did not as she felt like it was improving despite what monitor results show.  Confirmed with [facility] that pt currently in NSR

## 2019-08-10 ENCOUNTER — Telehealth: Payer: Medicare Other

## 2019-08-12 ENCOUNTER — Ambulatory Visit: Payer: Medicare Other

## 2019-08-12 NOTE — Progress Notes (Unsigned)
***not seen [facility]  Name: Shannon [name]  MRN: [id number] DOB: 08-Jul-1950  Chief Complaint/ HPI  Shannon [name],  69 y.o. , female presents for their Initial CCM visit with the clinical pharmacist via telephone due to COVID-19 Pandemic.  PCP : Brunetta Jeans, PA-C  Their chronic conditions include:  No diagnosis found. Consult Visit: 06/13/2019 (ED): acute URI 05/12/2019 (Afib clinic): c/o AF, feeling better from sob standpoint, difficulty tolerating higher doses of BB 03/07/2019 (Dr. Rayann Heman, Cardiology): Postural dizziness associated with low BP, palpitations of unclear etiology. Advised implantation of an implantable loop recorder for long term arrhythmia monitoring - patient wanting to move forward. Continue xarelto for chads2vasc 4.  Coreg dose decreased.   Patient Active Problem List   Diagnosis Date Noted  . Palpitations 03/07/2019  . Morbid obesity ([place]) 09/11/2017  . Chronic fatigue 05/27/2016  . A-fib ([place]) 03/25/2016  . Typical atrial flutter ([place])   . Chronic diastolic CHF (congestive heart failure) ([place]) 12/08/2015  . Thoracic compression fracture ([place]) 11/16/2015  . Orthostatic hypotension 11/15/2015  . Osteopenia 09/12/2015  . Vitamin D deficiency 08/29/2015  . OSA (obstructive sleep apnea) 03/04/2015  . Polymyalgia ([place]) 11/22/2014  . Essential hypertension 08/21/2014  . Anxiety and depression 08/21/2014  . Hip stiffness 08/21/2014   Past Surgical History:  Procedure Laterality Date  . APPENDECTOMY  1990s  . ATRIAL FIBRILLATION ABLATION N/A 03/25/2016   Procedure: Atrial Fibrillation Ablation;  Surgeon: [name], MD;  Location: [facility];  Service: Cardiovascular;  Laterality: N/A;  . COLONOSCOPY    . FOREARM FRACTURE SURGERY Left ~ 02/2011   "broke arm; shattered wrist"  . FOREARM HARDWARE REMOVAL Left ~ 07/2011  . implantable loop recorder placement  03/07/2019   Medtronic Reveal Linq model LNQ 22  implantable loop recorder ([id number]) implanted by Dr Rayann Heman for Afib management  . Spinal Nerve Ablation    . TEE WITHOUT CARDIOVERSION N/A 03/24/2016   Procedure: TRANSESOPHAGEAL ECHOCARDIOGRAM (TEE);  Surgeon: Sanda Klein, MD;  Location: [facility];  Service: Cardiovascular;  Laterality: N/A;   Social History   Socioeconomic History  . Marital status: Married    Spouse name: Not on file  . Number of children: 2  . Years of education: Not on file  . Highest education level: Not on file  Occupational History  . Occupation: retired  Tobacco Use  . Smoking status: Former Smoker    Packs/day: 0.50    Years: 44.00    Pack years: 22.00    Types: Cigarettes, E-cigarettes    Quit date: 09/06/2013    Years since quitting: 5.9  . Smokeless tobacco: Never Used  Vaping Use  . Vaping Use: Former  Substance and Sexual Activity  . Alcohol use: Yes    Comment: 03/25/2016 "nothing for a couple years now; was having a drink on anniversary and Christmas"  . Drug use: No  . Sexual activity: Not Currently  Other Topics Concern  . Not on file  Social History Narrative   ** Merged History Encounter **       Social Determinants of Health   Financial Resource Strain:   . Difficulty of Paying Living Expenses:   Food Insecurity:   . Worried About Charity fundraiser in the Last Year:   . Arboriculturist in the Last Year:   Transportation Needs:   . Film/video editor (Medical):   Marland Kitchen Lack of Transportation (Non-Medical):   Physical Activity:   .  Days of Exercise per Week:   . Minutes of Exercise per Session:   Stress:   . Feeling of Stress :   Social Connections:   . Frequency of Communication with Friends and Family:   . Frequency of Social Gatherings with Friends and Family:   . Attends Religious Services:   . Active Member of Clubs or Organizations:   . Attends Archivist Meetings:   Marland Kitchen Marital Status:    Family History  Problem Relation Age of Onset  . Lung  cancer Mother   . Stroke Father   . Hypertension Father   . Heart disease Father   . Hypertension Maternal Grandmother   . Stroke Maternal Grandfather   . Heart disease Maternal Grandfather   . Diabetes Paternal Grandmother   . Heart disease Paternal Grandmother   . Diabetes Paternal Grandfather   . Bipolar disorder Daughter   . Other Daughter        fatty liver  . Other Son        fattye liver, born with 1 kidney  . Colon cancer Neg Hx   . Esophageal cancer Neg Hx   . Rectal cancer Neg Hx   . Stomach cancer Neg Hx    Allergies  Allergen Reactions  . Ace Inhibitors Cough  . Hctz [Hydrochlorothiazide] Other (See Comments)    Caused drug-induced LUPUS  . Oxycodone Other (See Comments)    Hallucinations  . Prednisone Other (See Comments)    Made patient very aggressive  . Sulfa Antibiotics Nausea And Vomiting  . Sulfur Nausea And Vomiting  . Voltaren [Diclofenac Sodium] Other (See Comments)    Made patient become aggressive   Patient Care Team    Relationship Specialty Notifications Start End  Brunetta Jeans, [place] PCP - General Family Medicine  11/21/15   Madelin Rear, [facility] Pharmacist Pharmacist  04/26/19    Comment: PHONE NUMBER [phone]   Outpatient Encounter Medications as of 08/12/2019  Medication Sig  . acetaminophen (TYLENOL) 650 MG CR tablet Take 650-1,300 mg by mouth every 8 (eight) hours as needed for pain.   Marland Kitchen amLODipine (NORVASC) 10 MG tablet Taking by mouth 1/2 tablet daily  . benzonatate (TESSALON) 100 MG capsule Take 1 capsule (100 mg total) by mouth 3 (three) times daily as needed for cough.  . carvedilol (COREG) 6.25 MG tablet Take 1 tablet (6.25 mg total) by mouth 2 (two) times daily.  . cholecalciferol (VITAMIN D) 1000 units tablet Take 1,000 Units by mouth daily.  Marland Kitchen doxycycline (VIBRA-TABS) 100 MG tablet Take 1 tablet (100 mg total) by mouth 2 (two) times daily.  . DULoxetine (CYMBALTA) 30 MG capsule TAKE 1 CAPSULE BY MOUTH EVERY DAY  . flecainide  (TAMBOCOR) 50 MG tablet Take 2 tablets by mouth daily as needed for breakthrough afib.  Marland Kitchen guaiFENesin (MUCINEX) 600 MG 12 hr tablet Take 2 tablets (1,200 mg total) by mouth 2 (two) times daily as needed.  . loratadine (CLARITIN) 10 MG tablet Take 10 mg by mouth daily.  Marland Kitchen LORazepam (ATIVAN) 0.5 MG tablet Take 1 tablet (0.5 mg total) by mouth 2 (two) times daily as needed for anxiety.  . triamcinolone (NASACORT ALLERGY 24HR) 55 MCG/ACT AERO nasal inhaler Place 2 sprays into the nose daily.  Alveda Reasons 20 MG TABS tablet TAKE 1 TABLET BY MOUTH EVERY DAY   No facility-administered encounter medications on file as of 08/12/2019.   Current Diagnosis/Assessment: Goals Addressed   None    Hypertension   Denies  dizziness, SOB, chest pain within last week. Expresses difficulty halving 10 mg amlodipine tablet to 5 mg daily. Patient checks BP at home daily. Readings at goal <140/90.   BP Readings from Last 3 Encounters:  06/13/19 (!) 152/84  06/10/19 136/72  05/09/19 (!) 143/75   Patient is currently controlled on the following medications:  . Amlodipine 10 mg tablet - half tablet (5 mg) once daily   Plan  Recommend stop amlodipine 10 mg tab as half tablet (5 mg) daily.  Recommend start 5 mg tab daily.  AFIB   Pulse Readings from Last 3 Encounters:  05/09/19 (!) 55  03/07/19 70  02/23/19 (!) 58   Patient is currently rate controlled. HR <70 BPM.  Patient is currently controlled on the following medications:  . Carvedilol 6.25 mg twice daily . Flecainide 50 mg tablet - two tablets daily as needed for breakthrough afib  Prevention of stroke in afib. CHA2DS2-VASc Score = 4. Denies any abnormal bruising, bleeding from nose or gums or blood in urine or stool. Patient is currently controlled on the following medications:  . Xarelto 20 mg once every night   Plan  Continue current management  Heart failure    Patient has failed these meds in past: *** Patient is currently {CHL  Controlled/[phone]} on the following medications:  . ***  We discussed:  ***  Plan  Continue {CHL HP Upstream Pharmacy Plans:[phone]}   Anxiety/Depression   PHQ9 SCORE ONLY 06/10/2019 10/12/2018 06/30/2018  PHQ-9 Total Score 1 0 0   Reports continued benefit from medications. Denies any side effects at this time. Patient is currently controlled on the following medications:  . Lorazepam 0.5 mg twice daily as needed . Duloxetine 30 mg once daily   Plan  Continue current medications.  Vaccines   Reviewed and discussed patient's vaccination history.    Immunization History  Administered Date(s) Administered  . Fluad Quad(high Dose 65+) 10/12/2018  . Influenza, High Dose Seasonal PF 11/20/2016  . Influenza,inj,Quad PF,6+ Mos 12/07/2014, 11/27/2015, 10/28/2017  . Influenza-Unspecified 10/06/2013  . Pneumococcal Conjugate-13 09/23/2011  . Pneumococcal Polysaccharide-23 12/08/2017  . Tdap 09/08/2006  . Zoster 09/23/2011   Plan  Recommended patient receive Shingrix and Tdap vaccine in pharmacy.   Medication Management   Denies any issues with current medication management. Receives prescription medications from:  [id number] [place], [address] [place] [address] Phone: [phone] Fax: [phone]   Plan  Continue current medication management strategy.  Follow up: 2 month phone visit. ______________ Visit Information SDOH (Social Determinants of Health) assessments performed: Yes.  Ms. Hodgkins was given information about Chronic Care Management services today including:  1. CCM service includes personalized support from designated clinical staff supervised by her physician, including individualized plan of care and coordination with other care providers 2. 24/7 contact phone numbers for assistance for urgent and routine care needs. 3. Standard insurance, coinsurance, copays and deductibles apply for  chronic care management only during months in which we provide at least 20 minutes of these services. Most insurances cover these services at 100%, however patients may be responsible for any copay, coinsurance and/or deductible if applicable. This service may help you avoid the need for more expensive face-to-face services. 4. Only one practitioner may furnish and bill the service in a calendar month. 5. The patient may stop CCM services at any time (effective at the end of the month) by phone call to the office staff.  Patient agreed to  services and verbal consent obtained.   Madelin Rear, Pharm.D., BCGP Clinical Pharmacist [facility] [phone]

## 2019-08-15 ENCOUNTER — Ambulatory Visit (INDEPENDENT_AMBULATORY_CARE_PROVIDER_SITE_OTHER): Payer: Medicare Other | Admitting: *Deleted

## 2019-08-15 DIAGNOSIS — I4811 Longstanding persistent atrial fibrillation: Secondary | ICD-10-CM | POA: Diagnosis not present

## 2019-08-15 LAB — CUP PACEART REMOTE DEVICE CHECK
Date Time Interrogation Session: 20210807230504
Implantable Pulse Generator Implant Date: 20210301

## 2019-08-16 NOTE — Progress Notes (Signed)
Carelink Summary Report / Loop Recorder 

## 2019-08-18 ENCOUNTER — Ambulatory Visit (HOSPITAL_COMMUNITY)
Admission: RE | Admit: 2019-08-18 | Discharge: 2019-08-18 | Disposition: A | Discharge status: Home / Self Care | Payer: Medicare Other | Source: Ambulatory Visit | Attending: Nurse Practitioner | Admitting: Nurse Practitioner

## 2019-08-18 ENCOUNTER — Other Ambulatory Visit: Payer: Self-pay

## 2019-08-18 ENCOUNTER — Encounter (HOSPITAL_COMMUNITY): Payer: Self-pay | Admitting: Nurse Practitioner

## 2019-08-18 VITALS — BP 148/64 | HR 58 | Ht 67.0 in | Wt 225.0 lb

## 2019-08-18 DIAGNOSIS — Z79899 Other long term (current) drug therapy: Secondary | ICD-10-CM | POA: Insufficient documentation

## 2019-08-18 DIAGNOSIS — Z885 Allergy status to narcotic agent status: Secondary | ICD-10-CM | POA: Diagnosis not present

## 2019-08-18 DIAGNOSIS — G4733 Obstructive sleep apnea (adult) (pediatric): Secondary | ICD-10-CM | POA: Insufficient documentation

## 2019-08-18 DIAGNOSIS — E559 Vitamin D deficiency, unspecified: Secondary | ICD-10-CM | POA: Diagnosis not present

## 2019-08-18 DIAGNOSIS — I480 Paroxysmal atrial fibrillation: Secondary | ICD-10-CM

## 2019-08-18 DIAGNOSIS — I2510 Atherosclerotic heart disease of native coronary artery without angina pectoris: Secondary | ICD-10-CM | POA: Diagnosis not present

## 2019-08-18 DIAGNOSIS — I4891 Unspecified atrial fibrillation: Secondary | ICD-10-CM | POA: Insufficient documentation

## 2019-08-18 DIAGNOSIS — Z7901 Long term (current) use of anticoagulants: Secondary | ICD-10-CM | POA: Diagnosis not present

## 2019-08-18 DIAGNOSIS — Z8249 Family history of ischemic heart disease and other diseases of the circulatory system: Secondary | ICD-10-CM | POA: Insufficient documentation

## 2019-08-18 DIAGNOSIS — Z888 Allergy status to other drugs, medicaments and biological substances status: Secondary | ICD-10-CM | POA: Diagnosis not present

## 2019-08-18 DIAGNOSIS — I509 Heart failure, unspecified: Secondary | ICD-10-CM | POA: Diagnosis not present

## 2019-08-18 DIAGNOSIS — Z9989 Dependence on other enabling machines and devices: Secondary | ICD-10-CM | POA: Insufficient documentation

## 2019-08-18 DIAGNOSIS — I110 Hypertensive heart disease with heart failure: Secondary | ICD-10-CM | POA: Diagnosis not present

## 2019-08-18 DIAGNOSIS — F329 Major depressive disorder, single episode, unspecified: Secondary | ICD-10-CM | POA: Diagnosis not present

## 2019-08-18 DIAGNOSIS — F419 Anxiety disorder, unspecified: Secondary | ICD-10-CM | POA: Diagnosis not present

## 2019-08-18 DIAGNOSIS — Z9049 Acquired absence of other specified parts of digestive tract: Secondary | ICD-10-CM | POA: Insufficient documentation

## 2019-08-18 DIAGNOSIS — K219 Gastro-esophageal reflux disease without esophagitis: Secondary | ICD-10-CM | POA: Diagnosis not present

## 2019-08-18 DIAGNOSIS — J45909 Unspecified asthma, uncomplicated: Secondary | ICD-10-CM | POA: Insufficient documentation

## 2019-08-18 DIAGNOSIS — Z882 Allergy status to sulfonamides status: Secondary | ICD-10-CM | POA: Diagnosis not present

## 2019-08-18 DIAGNOSIS — Z87891 Personal history of nicotine dependence: Secondary | ICD-10-CM | POA: Diagnosis not present

## 2019-08-18 DIAGNOSIS — M545 Low back pain: Secondary | ICD-10-CM | POA: Diagnosis not present

## 2019-08-18 DIAGNOSIS — D6869 Other thrombophilia: Secondary | ICD-10-CM

## 2019-08-18 DIAGNOSIS — M8580 Other specified disorders of bone density and structure, unspecified site: Secondary | ICD-10-CM | POA: Diagnosis not present

## 2019-08-18 DIAGNOSIS — M1990 Unspecified osteoarthritis, unspecified site: Secondary | ICD-10-CM | POA: Insufficient documentation

## 2019-08-18 DIAGNOSIS — E785 Hyperlipidemia, unspecified: Secondary | ICD-10-CM | POA: Diagnosis not present

## 2019-08-18 DIAGNOSIS — G709 Myoneural disorder, unspecified: Secondary | ICD-10-CM | POA: Diagnosis not present

## 2019-08-18 DIAGNOSIS — G8929 Other chronic pain: Secondary | ICD-10-CM | POA: Diagnosis not present
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
## 2019-09-19 ENCOUNTER — Ambulatory Visit (INDEPENDENT_AMBULATORY_CARE_PROVIDER_SITE_OTHER): Payer: Medicare Other | Admitting: *Deleted

## 2019-09-19 DIAGNOSIS — I4811 Longstanding persistent atrial fibrillation: Secondary | ICD-10-CM

## 2019-09-19 LAB — CUP PACEART REMOTE DEVICE CHECK
Date Time Interrogation Session: 20210910091602
Implantable Pulse Generator Implant Date: 20210301

## 2019-09-20 ENCOUNTER — Telehealth: Payer: Self-pay

## 2019-09-20 NOTE — Telephone Encounter (Signed)
[facility] alert-   average VR >threshold. AF burden 8.9%, EGM's show AF w/ RVR 130-180's. Known PAF on [place].   Presenting Rhythm WNL.    Patient last seen in AF clinic 08/18/19  Attemtped to reach patient to determine if symptomatic and med compliance, no answer/ no VM.  Forwarding to AF clinic for f/u.

## 2019-09-20 NOTE — Telephone Encounter (Signed)
Per last office visit on 08/18/19 pt has occasional  breakthrough episodes but responds quickly to extra carvedilol. Pt will call if issues arise that do not respond to extra medication.

## 2019-09-20 NOTE — Progress Notes (Signed)
Carelink Summary Report / Loop Recorder 

## 2019-10-14 ENCOUNTER — Other Ambulatory Visit: Payer: Self-pay | Admitting: Internal Medicine

## 2019-10-20 NOTE — Telephone Encounter (Signed)
Patient has since been evaluated in AF Clinic.

## 2019-10-22 LAB — CUP PACEART REMOTE DEVICE CHECK
Date Time Interrogation Session: 20211016004647
Implantable Pulse Generator Implant Date: 20210301

## 2019-10-24 ENCOUNTER — Ambulatory Visit (INDEPENDENT_AMBULATORY_CARE_PROVIDER_SITE_OTHER): Payer: Medicare Other

## 2019-10-24 DIAGNOSIS — I480 Paroxysmal atrial fibrillation: Secondary | ICD-10-CM

## 2019-10-31 NOTE — Progress Notes (Signed)
Carelink Summary Report / Loop Recorder 

## 2019-11-07 ENCOUNTER — Encounter: Payer: Self-pay | Admitting: Physician Assistant

## 2019-11-14 ENCOUNTER — Encounter: Payer: Self-pay | Admitting: Physician Assistant

## 2019-11-14 ENCOUNTER — Ambulatory Visit (INDEPENDENT_AMBULATORY_CARE_PROVIDER_SITE_OTHER): Payer: Medicare Other | Admitting: Physician Assistant

## 2019-11-14 ENCOUNTER — Other Ambulatory Visit: Payer: Self-pay

## 2019-11-14 VITALS — BP 120/70 | HR 61 | Temp 98.0°F | Resp 16 | Ht 67.0 in | Wt 227.0 lb

## 2019-11-14 DIAGNOSIS — R42 Dizziness and giddiness: Secondary | ICD-10-CM

## 2019-11-14 NOTE — Patient Instructions (Signed)
Please keep well-hydrated and get plenty of rest.  You may want to consider use of compression stockings in the morning to help cut down on BP drop with position change -- this is exacerbated due to your medication so you may want to follow-up again with cardiology.  I am checking labs today to assess for other contributing factors.  I will call as soon as results are in.
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
## 2019-11-15 LAB — CBC WITH DIFFERENTIAL/PLATELET
Basophils Absolute: 0.1 10*3/uL (ref 0.0–0.1)
Basophils Relative: 1 % (ref 0.0–3.0)
Eosinophils Absolute: 0.3 10*3/uL (ref 0.0–0.7)
Eosinophils Relative: 4.1 % (ref 0.0–5.0)
HCT: 38.5 % (ref 36.0–46.0)
Hemoglobin: 12.9 g/dL (ref 12.0–15.0)
Lymphocytes Relative: 22.2 % (ref 12.0–46.0)
Lymphs Abs: 1.8 10*3/uL (ref 0.7–4.0)
MCHC: 33.6 g/dL (ref 30.0–36.0)
MCV: 88.1 fl (ref 78.0–100.0)
Monocytes Absolute: 0.7 10*3/uL (ref 0.1–1.0)
Monocytes Relative: 8.6 % (ref 3.0–12.0)
Neutro Abs: 5.1 10*3/uL (ref 1.4–7.7)
Neutrophils Relative %: 64.1 % (ref 43.0–77.0)
Platelets: 310 10*3/uL (ref 150.0–400.0)
RBC: 4.36 Mil/uL (ref 3.87–5.11)
RDW: 14.2 % (ref 11.5–15.5)
WBC: 8 10*3/uL (ref 4.0–10.5)

## 2019-11-15 LAB — COMPREHENSIVE METABOLIC PANEL
ALT: 17 U/L (ref 0–35)
AST: 18 U/L (ref 0–37)
Albumin: 4.3 g/dL (ref 3.5–5.2)
Alkaline Phosphatase: 95 U/L (ref 39–117)
BUN: 13 mg/dL (ref 6–23)
CO2: 29 mEq/L (ref 19–32)
Calcium: 9.1 mg/dL (ref 8.4–10.5)
Chloride: 101 mEq/L (ref 96–112)
Creatinine, Ser: 0.73 mg/dL (ref 0.40–1.20)
GFR: 84.5 mL/min (ref >60.00)
Glucose, Bld: 95 mg/dL (ref 70–99)
Potassium: 3.9 mEq/L (ref 3.5–5.1)
Sodium: 138 mEq/L (ref 135–145)
Total Bilirubin: 0.5 mg/dL (ref 0.2–1.2)
Total Protein: 7.1 g/dL (ref 6.0–8.3)

## 2019-11-17 ENCOUNTER — Telehealth: Payer: Self-pay | Admitting: Physician Assistant

## 2019-11-17 ENCOUNTER — Telehealth: Payer: Self-pay

## 2019-11-17 NOTE — Telephone Encounter (Signed)
Left message for patient to schedule Annual Wellness Visit.  Please schedule with Nurse Health Advisor Martha Stanley, RN at [place]  

## 2019-11-17 NOTE — Telephone Encounter (Signed)
ERROR

## 2019-11-21 ENCOUNTER — Ambulatory Visit (INDEPENDENT_AMBULATORY_CARE_PROVIDER_SITE_OTHER): Payer: Medicare Other

## 2019-11-21 DIAGNOSIS — I480 Paroxysmal atrial fibrillation: Secondary | ICD-10-CM | POA: Diagnosis not present

## 2019-11-21 LAB — CUP PACEART REMOTE DEVICE CHECK
Date Time Interrogation Session: 20211115092113
Implantable Pulse Generator Implant Date: 20210301

## 2019-11-22 NOTE — Progress Notes (Signed)
Carelink Summary Report / Loop Recorder 

## 2019-12-02 ENCOUNTER — Other Ambulatory Visit: Payer: Self-pay | Admitting: Physician Assistant

## 2019-12-07 ENCOUNTER — Other Ambulatory Visit: Payer: Self-pay | Admitting: Physician Assistant

## 2019-12-13 ENCOUNTER — Telehealth: Payer: Self-pay

## 2019-12-13 NOTE — Telephone Encounter (Signed)
[name] II alert received 12/12/19 for AF burden 12.8%, longest in duration 16 hours 40 minutes, average v rates 60-160's. EGM's suggest AF. Presenting today (12/13/19) VS 69. VR's are noted elevated as high as 207 bpm.   Patient reports she has experienced generalized weakness with lightheadedness over the past week. States she has not been able to get up much d/t not feeling well. AF w/ RVR events noted during this time. Denies syncopal. Patient states she has been taking Coreg 6.25 mg BID. Has not taken flecainide in over 6 months. Patient states she thought her AF was managed well but wasn't sure why she has not felt well for the past week. Patient was evaluated for by PCP 11/14/19, positive orthostatic symptoms with position change, blood pressure dipping but not extremely so (per note). Advised patient to follow up with cardiology. Per Laroy Apple, patient needs to follow up per recall per her last OV note.   Patient reports she felt improvement yesterday and today. ED precautions advised.   Advised patient she is overdue to be seen by Dr. Rayann Heman, I will send to scheduling for recall. Doylene Canning will offer patient apt within the next week, if patient not able to make any available, will send to AF clinic.
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
## 2019-12-14 ENCOUNTER — Encounter: Payer: Self-pay | Admitting: Physician Assistant

## 2019-12-14 ENCOUNTER — Other Ambulatory Visit: Payer: Self-pay

## 2019-12-14 ENCOUNTER — Ambulatory Visit (INDEPENDENT_AMBULATORY_CARE_PROVIDER_SITE_OTHER): Payer: Medicare Other | Admitting: Physician Assistant

## 2019-12-14 VITALS — BP 120/70 | HR 69 | Ht 67.0 in | Wt 225.0 lb

## 2019-12-14 DIAGNOSIS — Z4509 Encounter for adjustment and management of other cardiac device: Secondary | ICD-10-CM

## 2019-12-14 DIAGNOSIS — I5032 Chronic diastolic (congestive) heart failure: Secondary | ICD-10-CM

## 2019-12-14 DIAGNOSIS — R0602 Shortness of breath: Secondary | ICD-10-CM | POA: Diagnosis not present

## 2019-12-14 DIAGNOSIS — R55 Syncope and collapse: Secondary | ICD-10-CM

## 2019-12-14 DIAGNOSIS — I4811 Longstanding persistent atrial fibrillation: Secondary | ICD-10-CM

## 2019-12-14 DIAGNOSIS — I480 Paroxysmal atrial fibrillation: Secondary | ICD-10-CM | POA: Diagnosis not present

## 2019-12-14 DIAGNOSIS — I10 Essential (primary) hypertension: Secondary | ICD-10-CM | POA: Diagnosis not present

## 2019-12-14 MED ORDER — CARVEDILOL 6.25 MG PO TABS: ORAL_TABLET | ORAL | 1 refills | Status: DC

## 2019-12-14 NOTE — Patient Instructions (Signed)
Medication Instructions:   START TAKING  CARVEDILOL (COREG) 6.25 MG  IN AM AND 12.5 MG IN PM   *If you need a refill on your cardiac medications before your next appointment, please call your pharmacy*   Lab Work: NONE ORDERED  TODAY   If you have labs (blood work) drawn today and your tests are completely normal, you will receive your results only by: Marland Kitchen MyChart Message (if you have MyChart) OR . A paper copy in the mail If you have any lab test that is abnormal or we need to change your treatment, we will call you to review the results.   Testing/Procedures:  NONE ORDERED  TODAY     Follow-Up: At [facility], you and your health needs are our priority.  As part of our continuing mission to provide you with exceptional heart care, we have created designated Provider Care Teams.  These Care Teams include your primary Cardiologist (physician) and Advanced Practice Providers (APPs -  Physician Assistants and Nurse Practitioners) who all work together to provide you with the care you need, when you need it.  We recommend signing up for the patient portal called "MyChart".  Sign up information is provided on this After Visit Summary.  MyChart is used to connect with patients for Virtual Visits (Telemedicine).  Patients are able to view lab/test results, encounter notes, upcoming appointments, etc.  Non-urgent messages can be sent to your provider as well.   To learn more about what you can do with MyChart, go to [email or web address].    Your next appointment:   1 month(s)  The format for your next appointment:   In Person  Provider:   [name], MD   Other Instructions

## 2019-12-22 ENCOUNTER — Ambulatory Visit (INDEPENDENT_AMBULATORY_CARE_PROVIDER_SITE_OTHER): Payer: Medicare Other

## 2019-12-22 DIAGNOSIS — I480 Paroxysmal atrial fibrillation: Secondary | ICD-10-CM | POA: Diagnosis not present

## 2019-12-23 ENCOUNTER — Encounter: Payer: Self-pay | Admitting: Adult Health

## 2019-12-23 ENCOUNTER — Ambulatory Visit (INDEPENDENT_AMBULATORY_CARE_PROVIDER_SITE_OTHER): Payer: Medicare Other | Admitting: Adult Health

## 2019-12-23 ENCOUNTER — Other Ambulatory Visit: Payer: Self-pay

## 2019-12-23 ENCOUNTER — Ambulatory Visit (INDEPENDENT_AMBULATORY_CARE_PROVIDER_SITE_OTHER): Payer: Medicare Other

## 2019-12-23 VITALS — BP 120/70 | HR 69 | Temp 97.2°F | Ht 67.0 in | Wt 226.8 lb

## 2019-12-23 DIAGNOSIS — R0600 Dyspnea, unspecified: Secondary | ICD-10-CM | POA: Diagnosis not present

## 2019-12-23 DIAGNOSIS — G4733 Obstructive sleep apnea (adult) (pediatric): Secondary | ICD-10-CM | POA: Diagnosis not present

## 2019-12-23 DIAGNOSIS — R0609 Other forms of dyspnea: Secondary | ICD-10-CM | POA: Insufficient documentation

## 2019-12-23 DIAGNOSIS — J4520 Mild intermittent asthma, uncomplicated: Secondary | ICD-10-CM | POA: Diagnosis not present

## 2019-12-23 DIAGNOSIS — R0602 Shortness of breath: Secondary | ICD-10-CM | POA: Diagnosis not present

## 2019-12-23 DIAGNOSIS — Z23 Encounter for immunization: Secondary | ICD-10-CM

## 2019-12-23 DIAGNOSIS — J45909 Unspecified asthma, uncomplicated: Secondary | ICD-10-CM | POA: Insufficient documentation

## 2019-12-23 DIAGNOSIS — I517 Cardiomegaly: Secondary | ICD-10-CM | POA: Diagnosis not present

## 2019-12-23 IMAGING — DX DG CHEST 2V
2 series · 2 of 2 positions shown · non-contrast
Comparison: None.

CLINICAL DATA: Shortness of breath.  Dyspnea on exertion.

EXAM:
CHEST - 2 VIEW

[chest pa]
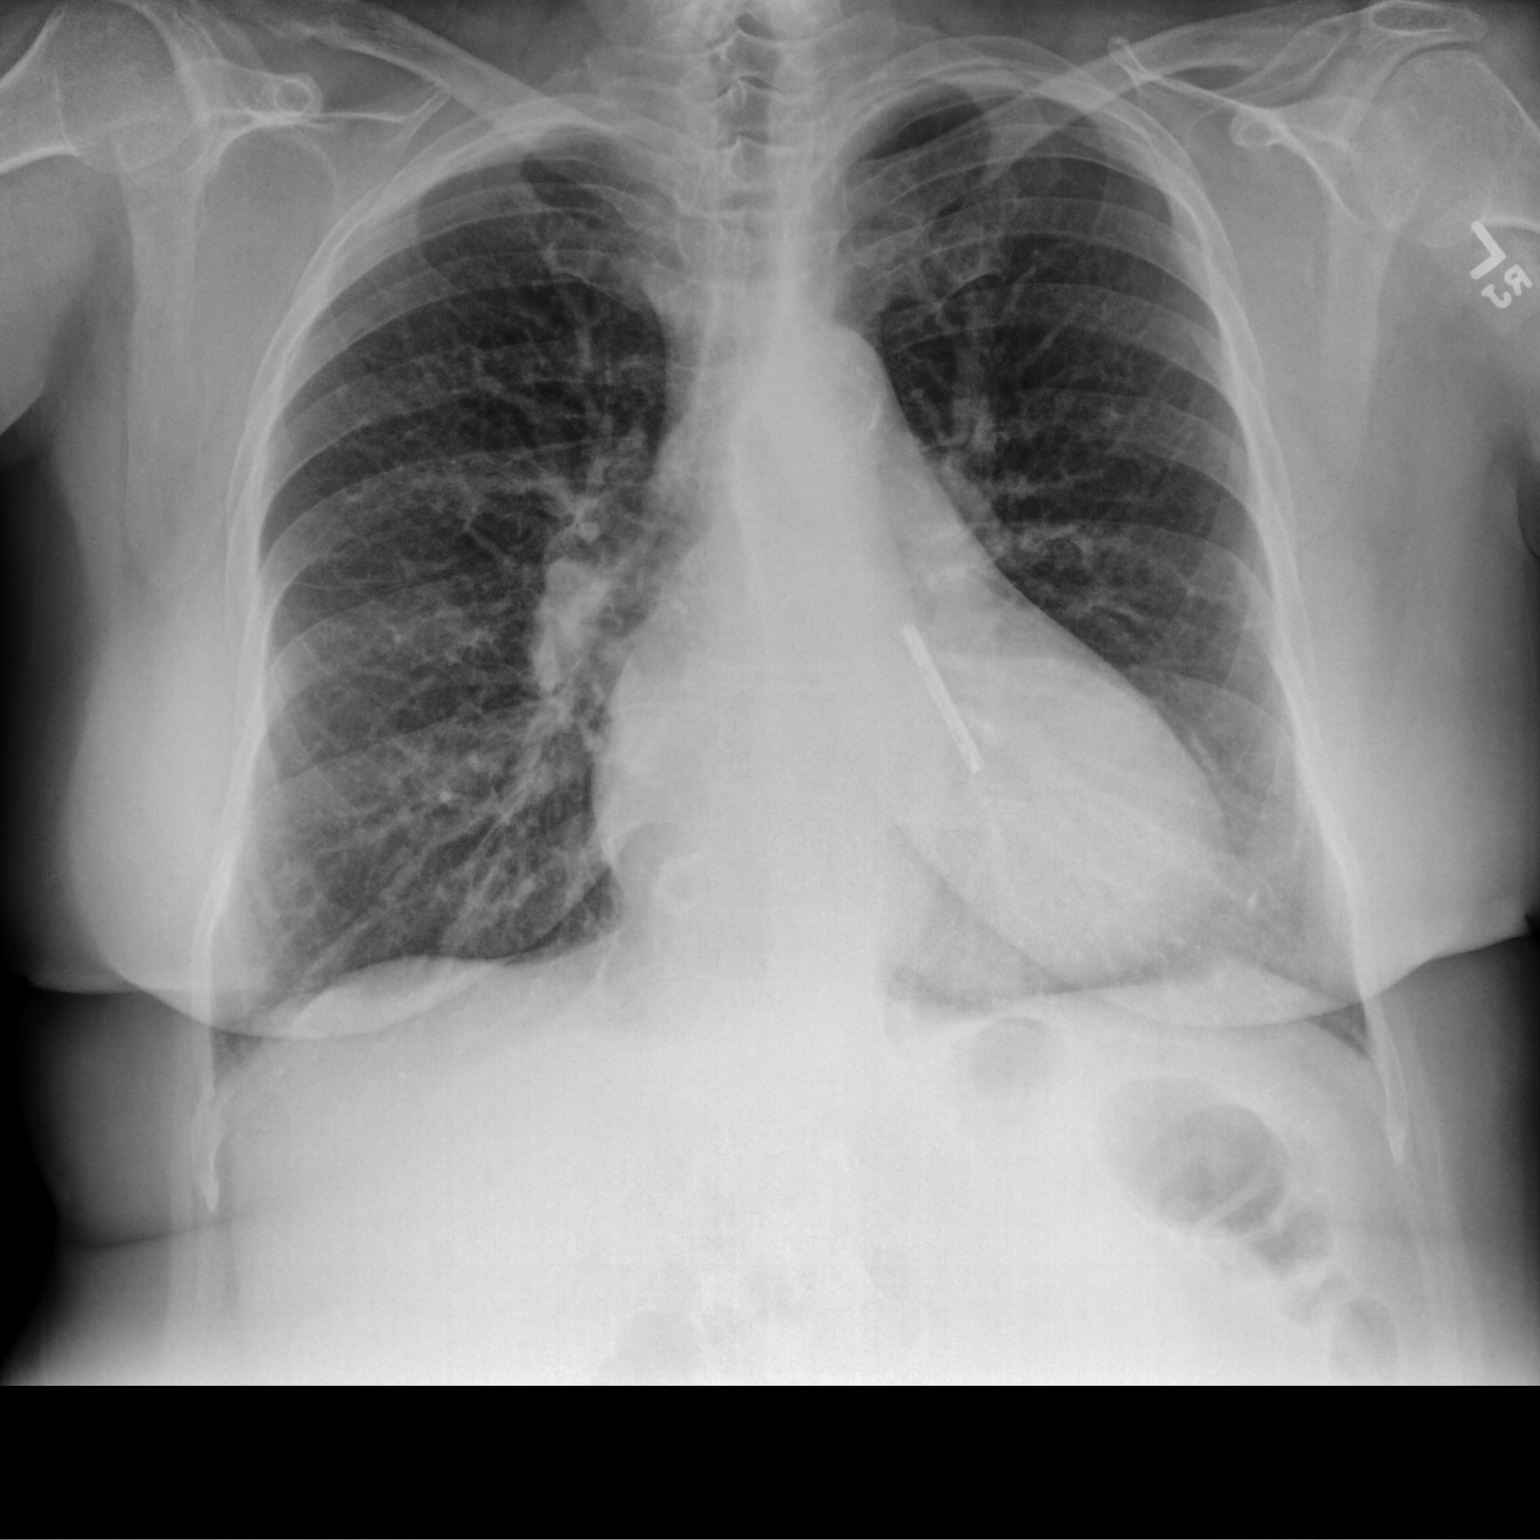

[chest lat]
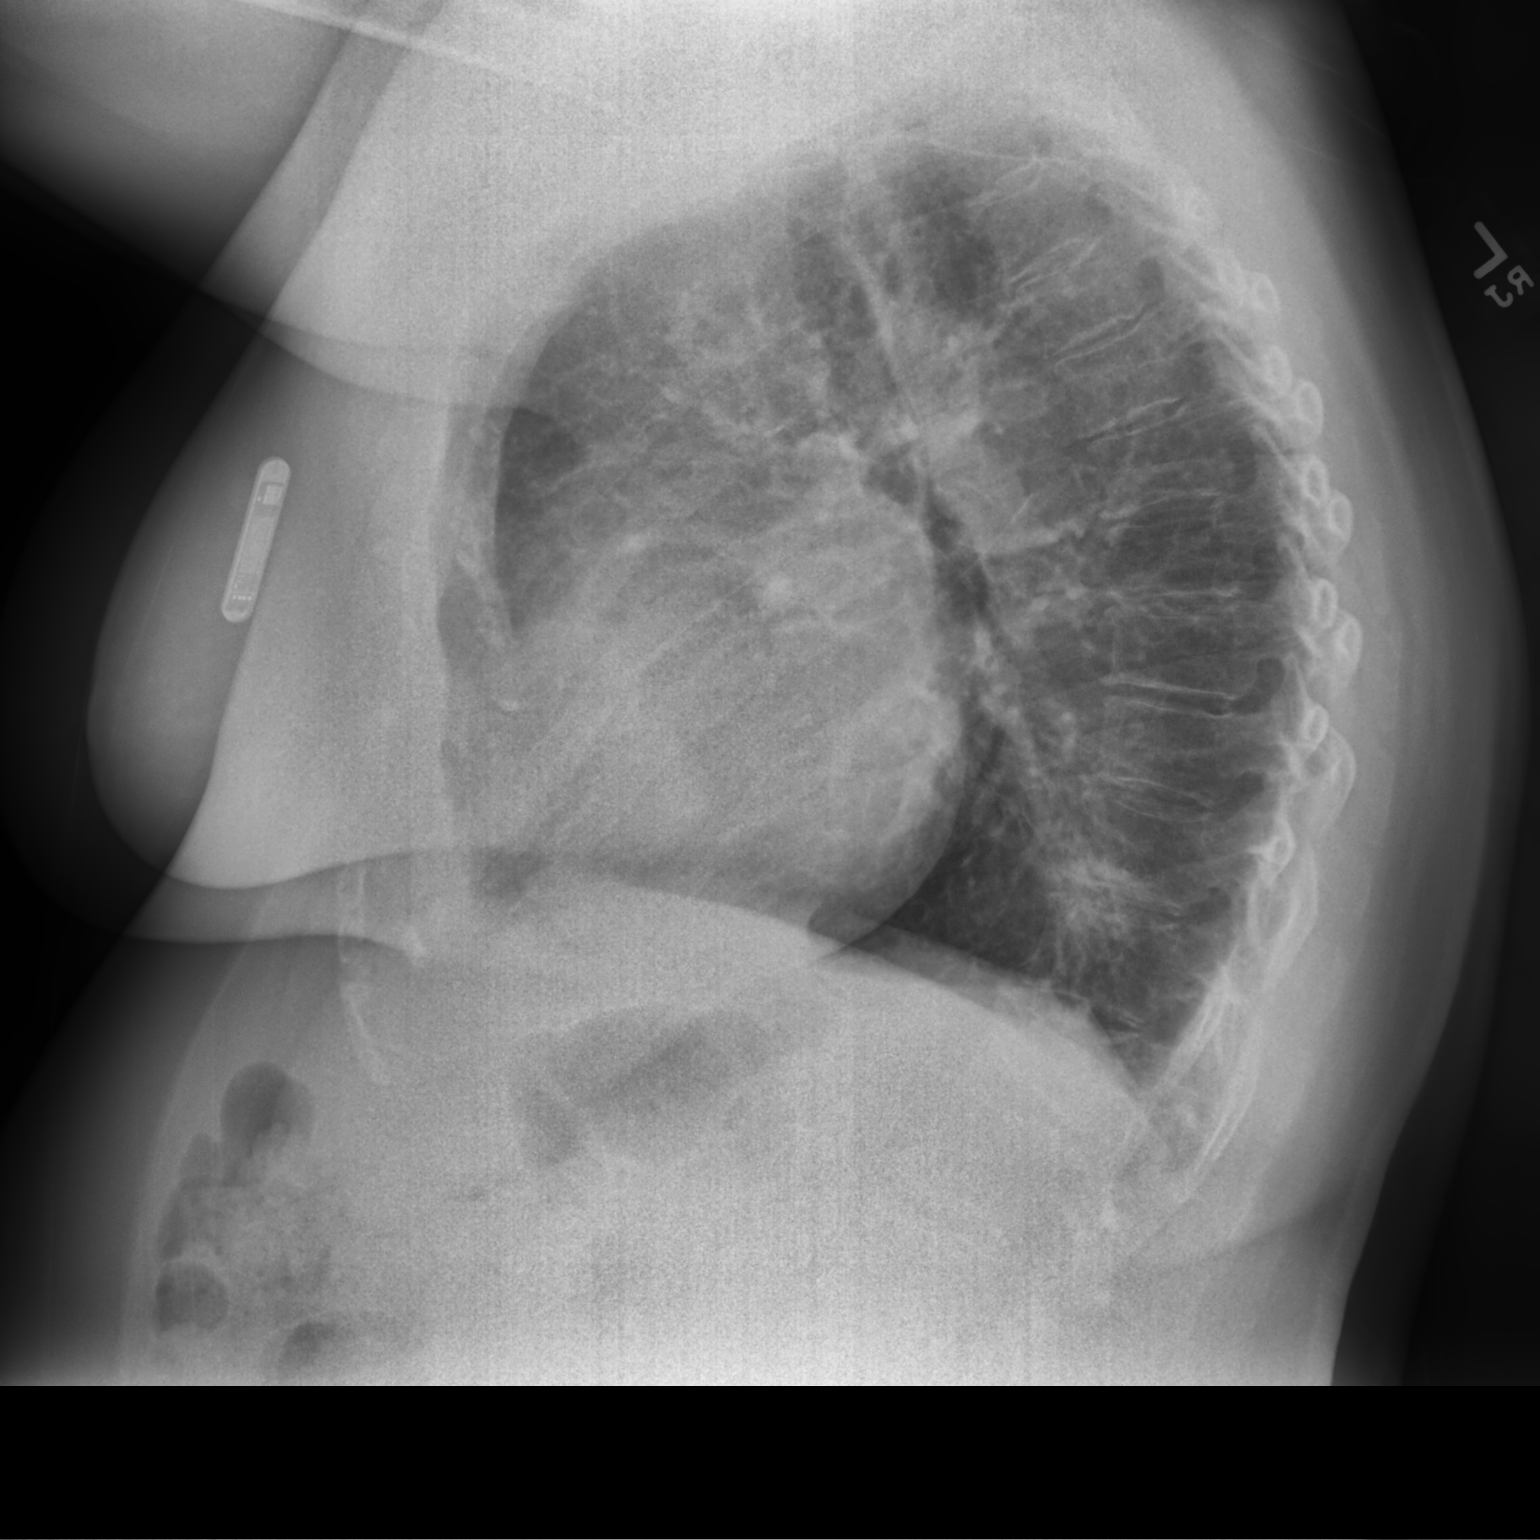

[2 of 2 positions shown; findings below may reference images not displayed]

FINDINGS: There is cardiomegaly without edema. No consolidative process,
pneumothorax or effusion. Aortic atherosclerosis. Loop recorder
noted. No acute or focal bony abnormality.
IMPRESSION: No acute disease.

Cardiomegaly.

Aortic Atherosclerosis ([7Q]-[7Q]).

## 2019-12-23 MED ORDER — ALBUTEROL SULFATE HFA 108 (90 BASE) MCG/ACT IN AERS: 1.0000 | INHALATION_SPRAY | Freq: Four times a day (QID) | RESPIRATORY_TRACT | 2 refills | Status: DC | PRN

## 2019-12-23 NOTE — Assessment & Plan Note (Signed)
Dyspnea with exertion x1 year questionable etiology.  Patient carries a diagnosis of asthma.  Will check PFTs on return.  Add albuterol as needed.  Control for triggers.  Check chest x-ray today.  Plan  Patient Instructions  Continue on CPAP At bedtime  .  Keep up the good work. Do not drive if sleepy Order for dreamwear nasal mask .  Work on healthy weight .  Saline nasal rinses as needed May use saline nasal gel at bedtime as needed May try Albuterol 1 puff every 6hr as needed wheezing  Begin Zyrtec 10mg  At bedtime  .  Chest xray today  Follow up with Dr. Elsworth Soho or Suhas Estis NP in 4-6 weeks with PFT  and As needed

## 2019-12-23 NOTE — Assessment & Plan Note (Signed)
Excellent control and compliance on nocturnal CPAP.  No changes.  Plan  Patient Instructions  Continue on CPAP At bedtime  .  Keep up the good work. Do not drive if sleepy Work on healthy weight .  Saline nasal rinses as needed May use saline nasal gel at bedtime as needed Follow up with Dr. Elsworth Soho or Trenyce Loera NP in 1 year and As needed

## 2019-12-23 NOTE — Assessment & Plan Note (Signed)
Healthy weight loss 

## 2019-12-23 NOTE — Patient Instructions (Addendum)
Continue on CPAP At bedtime  .  Keep up the good work. Do not drive if sleepy Order for dreamwear nasal mask .  Work on healthy weight .  Saline nasal rinses as needed May use saline nasal gel at bedtime as needed May try Albuterol 1 puff every 6hr as needed wheezing  Begin Zyrtec 10mg  At bedtime  .  Chest xray today  Follow up with Dr. Elsworth Soho or Nereida Schepp NP in 4-6 weeks with PFT  and As needed

## 2019-12-23 NOTE — Assessment & Plan Note (Signed)
History of asthma.  Patient with increased symptoms over the last several months.  With intermittent wheezing and dry cough.  We will control for triggers such as postnasal drainage.  Add in saline nasal spray.  Zyrtec daily.  Albuterol as needed.  Check chest x-ray today.  And PFTs on return

## 2019-12-23 NOTE — Progress Notes (Signed)
@Patient  ID: Shannon [name], female    DOB: [birth year], 69 y.o.   MRN: [id number]  Chief Complaint  Patient presents with  . Follow-up  . Sleep Apnea    Referring provider: Delorse Limber  HPI: 69 year old female followed for obstructive sleep apnea. History significant for chronic diastolic heart failure and A. fib   TEST/EVENTS :  PSG 05/2004-mild OSA with AHI 11/hour corrected by CPAP 9 cm  12/23/2019 Follow up : OSA Patient presents for 1 year follow-up.  Patient has underlying sleep apnea.  Patient is on nocturnal CPAP.  Patient says she is doing very well CPAP.  She never misses a night.  Cannot sleep without her CPAP.  Patient feels rested with no significant daytime sleepiness.  Feels that she benefits from CPAP.  CPAP download shows excellent compliance with daily average usage at 10 hours.  Patient has 100% compliance.  Patient is on auto CPAP 4 to 20 cm H2O.  Daily average pressure at 11.9 cm H2O.  AHI 0.1.  Complains of 1 year of dyspnea , worse with activities. Worse with cold and hot air., enclosed spaces. Mild wheezing . Previous dx with asthma in past.  Albuterol inhaler in past but not for a while .  Remodeling home. 2 dogs. Has some post nasal drainage.  No gerd. Mild dry cough . Former smoker .      Allergies  Allergen Reactions  . Ace Inhibitors Cough  . Hctz [Hydrochlorothiazide] Other (See Comments)    Caused drug-induced LUPUS  . Oxycodone Other (See Comments)    Hallucinations  . Prednisone Other (See Comments)    Made patient very aggressive  . Sulfa Antibiotics Nausea And Vomiting  . Sulfur Nausea And Vomiting  . Voltaren [Diclofenac Sodium] Other (See Comments)    Made patient become aggressive    Immunization History  Administered Date(s) Administered  . Fluad Quad(high Dose 65+) 10/12/2018  . Influenza, High Dose Seasonal PF 11/20/2016  . Influenza,inj,Quad PF,6+ Mos 12/07/2014, 11/27/2015, 10/28/2017  .  Influenza-Unspecified 10/06/2013  . Moderna Sars-Covid-2 Vaccination 02/19/2019, 03/20/2019  . Pneumococcal Conjugate-13 09/23/2011  . Pneumococcal Polysaccharide-23 12/08/2017  . Tdap 09/08/2006  . Zoster 09/23/2011    Past Medical History:  Diagnosis Date  . Allergy   . Anemia   . Anxiety   . Arthritis    "neck and lower back" (03/25/2016)  . Asthma 1990s X 1   "short term inhaler use"   . CAD (coronary artery disease)   . Chronic lower back pain   . Degenerative disorder of bone   . Depression   . Diastolic dysfunction   . Drug-induced lupus erythematosus    HCTZ induced; "still gettin over it" (03/25/2016)  . GERD (gastroesophageal reflux disease)   . Herniated disc, cervical   . Hyperlipidemia   . Hypertension   . Neuromuscular disorder ([place])    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 HTN  . Orthostatic hypotension   . OSA on CPAP   . Osteopenia   . PAF (paroxysmal atrial fibrillation) ([place])   . Pinched nerve in neck   . Sleep apnea    wears CPAP  . T12 compression fracture ([place]) 11/2015  . Vitamin D deficiency   . Whiplash injury 06/07/2010    Tobacco History: Social History   Tobacco Use  Smoking Status Former Smoker  . Packs/day: 0.50  . Years: 44.00  . Pack years: 22.00  . Types: Cigarettes, E-cigarettes  . Quit date:  09/06/2013  . Years since quitting: 6.2  Smokeless Tobacco Never Used   Counseling given: Not Answered   Outpatient Medications Prior to Visit  Medication Sig Dispense Refill  . acetaminophen (TYLENOL) 650 MG CR tablet Take 650-1,300 mg by mouth every 8 (eight) hours as needed for pain.     Marland Kitchen amLODipine (NORVASC) 10 MG tablet TAKE 1 TABLET BY MOUTH EVERY DAY 90 tablet 1  . carvedilol (COREG) 6.25 MG tablet Take 6.25 mg in am 12.5  mg in pm 90 tablet 1  . cholecalciferol (VITAMIN D) 1000 units tablet Take 1,000 Units by mouth daily.    . DULoxetine (CYMBALTA) 30 MG capsule TAKE 1 CAPSULE BY MOUTH EVERY DAY 90 capsule 1  .  flecainide (TAMBOCOR) 50 MG tablet Take 2 tablets by mouth daily as needed for breakthrough afib. 60 tablet 2  . loratadine (CLARITIN) 10 MG tablet Take 10 mg by mouth daily.    Marland Kitchen LORazepam (ATIVAN) 0.5 MG tablet Take 1 tablet (0.5 mg total) by mouth 2 (two) times daily as needed for anxiety. 30 tablet 1  . triamcinolone (NASACORT) 55 MCG/ACT AERO nasal inhaler Place 2 sprays into the nose daily.    Alveda Reasons 20 MG TABS tablet TAKE 1 TABLET BY MOUTH EVERY DAY 90 tablet 0   No facility-administered medications prior to visit.     Review of Systems:   Constitutional:   No  weight loss, night sweats,  Fevers, chills, fatigue, or  lassitude.  HEENT:   No headaches,  Difficulty swallowing,  Tooth/dental problems, or  Sore throat,                No sneezing, itching, ear ache, nasal congestion, post nasal drip,   CV:  No chest pain,  Orthopnea, PND, swelling in lower extremities, anasarca, dizziness, palpitations, syncope.   GI  No heartburn, indigestion, abdominal pain, nausea, vomiting, diarrhea, change in bowel habits, loss of appetite, bloody stools.   Resp: No shortness of breath with exertion or at rest.  No excess mucus, no productive cough,  No non-productive cough,  No coughing up of blood.  No change in color of mucus.  No wheezing.  No chest wall deformity  Skin: no rash or lesions.  GU: no dysuria, change in color of urine, no urgency or frequency.  No flank pain, no hematuria   MS:  No joint pain or swelling.  No decreased range of motion.  No back pain.    Physical Exam  BP 120/70 (BP Location: Right Arm, Patient Position: Sitting, Cuff Size: Normal)   Pulse 69   Temp (!) 97.2 F (36.2 C) (Temporal)   Ht 5\' 7"  (1.702 m)   Wt 226 lb 12.8 oz (102.9 kg)   BMI 35.52 kg/m   GEN: A/Ox3; pleasant , NAD, well nourished    HEENT:  [place]/AT,   NOSE-clear, THROAT-clear, no lesions, no postnasal drip or exudate noted.   NECK:  Supple w/ fair ROM; no JVD; normal carotid impulses  w/o bruits; no thyromegaly or nodules palpated; no lymphadenopathy.    RESP  Clear  P & A; w/o, wheezes/ rales/ or rhonchi. no accessory muscle use, no dullness to percussion  CARD:  RRR, no m/r/g, no peripheral edema, pulses intact, no cyanosis or clubbing.  GI:   Soft & nt; nml bowel sounds; no organomegaly or masses detected.   Musco: Warm bil, no deformities or joint swelling noted.   Neuro: alert, no focal deficits noted.  Skin: Warm, no lesions or rashes    Lab Results:  BNP  ProBNP No results found for: PROBNP  Imaging: No results found.    No flowsheet data found.  No results found for: NITRICOXIDE      Assessment & Plan:   OSA (obstructive sleep apnea) Excellent control and compliance on nocturnal CPAP.  No changes.  Plan  Patient Instructions  Continue on CPAP At bedtime  .  Keep up the good work. Do not drive if sleepy Work on healthy weight .  Saline nasal rinses as needed May use saline nasal gel at bedtime as needed Follow up with Dr. Elsworth Soho or Johnrobert Foti NP in 1 year and As needed        Morbid obesity ([place]) Healthy weight loss   Dyspnea Dyspnea with exertion x1 year questionable etiology.  Patient carries a diagnosis of asthma.  Will check PFTs on return.  Add albuterol as needed.  Control for triggers.  Check chest x-ray today.  Plan  Patient Instructions  Continue on CPAP At bedtime  .  Keep up the good work. Do not drive if sleepy Order for dreamwear nasal mask .  Work on healthy weight .  Saline nasal rinses as needed May use saline nasal gel at bedtime as needed May try Albuterol 1 puff every 6hr as needed wheezing  Begin Zyrtec 10mg  At bedtime  .  Chest xray today  Follow up with Dr. Elsworth Soho or Starlene Consuegra NP in 4-6 weeks with PFT  and As needed        Asthma History of asthma.  Patient with increased symptoms over the last several months.  With intermittent wheezing and dry cough.  We will control for triggers such as  postnasal drainage.  Add in saline nasal spray.  Zyrtec daily.  Albuterol as needed.  Check chest x-ray today.  And PFTs on return     Rexene Edison, NP 12/23/2019

## 2019-12-23 NOTE — Addendum Note (Signed)
Addended by: Vanessa Barbara on: 12/23/2019 04:43 PM   Modules accepted: Orders

## 2019-12-26 LAB — CUP PACEART REMOTE DEVICE CHECK
Date Time Interrogation Session: 20211220073310
Implantable Pulse Generator Implant Date: 20210301

## 2020-01-04 DIAGNOSIS — Z23 Encounter for immunization: Secondary | ICD-10-CM | POA: Diagnosis not present

## 2020-01-05 NOTE — Progress Notes (Signed)
Carelink Summary Report / Loop Recorder 

## 2020-01-10 ENCOUNTER — Other Ambulatory Visit: Payer: Self-pay | Admitting: Internal Medicine

## 2020-01-10 MED ORDER — RIVAROXABAN 20 MG PO TABS: 20.0000 mg | ORAL_TABLET | Freq: Every day | ORAL | 1 refills | Status: DC

## 2020-01-10 NOTE — Telephone Encounter (Signed)
Prescription refill request for Xarelto received.   Indication: Afib Last office visit: Shannon [name] 08/18/2019 Weight: 102.9 kg Age: 70 yo Scr: 0.73, 11/14/2019 CrCl: 121 ml/min   Prescription refill sent.

## 2020-01-10 NOTE — Addendum Note (Signed)
Addended by: Mellody Dance B on: 01/10/2020 12:12 PM   Modules accepted: Orders

## 2020-01-10 NOTE — Addendum Note (Signed)
Addended by: Mellody Dance B on: 01/10/2020 12:43 PM   Modules accepted: Orders

## 2020-01-11 ENCOUNTER — Other Ambulatory Visit: Payer: Self-pay | Admitting: Physician Assistant

## 2020-01-13 MED ORDER — CARVEDILOL 6.25 MG PO TABS: ORAL_TABLET | ORAL | 3 refills | Status: DC

## 2020-01-13 NOTE — Addendum Note (Signed)
Addended by: Carter Kitten D on: 01/13/2020 04:03 PM   Modules accepted: Orders

## 2020-01-16 ENCOUNTER — Encounter: Payer: Self-pay | Admitting: Internal Medicine

## 2020-01-16 ENCOUNTER — Other Ambulatory Visit: Payer: Self-pay

## 2020-01-16 ENCOUNTER — Encounter: Payer: Self-pay | Admitting: *Deleted

## 2020-01-16 ENCOUNTER — Ambulatory Visit: Payer: Medicare Other | Admitting: Internal Medicine

## 2020-01-16 VITALS — BP 126/70 | HR 56 | Ht 67.0 in | Wt 226.0 lb

## 2020-01-16 DIAGNOSIS — D6869 Other thrombophilia: Secondary | ICD-10-CM

## 2020-01-16 DIAGNOSIS — G4733 Obstructive sleep apnea (adult) (pediatric): Secondary | ICD-10-CM | POA: Diagnosis not present

## 2020-01-16 DIAGNOSIS — I10 Essential (primary) hypertension: Secondary | ICD-10-CM | POA: Diagnosis not present

## 2020-01-16 DIAGNOSIS — R0602 Shortness of breath: Secondary | ICD-10-CM

## 2020-01-16 DIAGNOSIS — I4891 Unspecified atrial fibrillation: Secondary | ICD-10-CM

## 2020-01-16 DIAGNOSIS — I480 Paroxysmal atrial fibrillation: Secondary | ICD-10-CM | POA: Diagnosis not present

## 2020-01-16 MED ORDER — FLECAINIDE ACETATE 50 MG PO TABS: 50.0000 mg | ORAL_TABLET | Freq: Two times a day (BID) | ORAL | 3 refills | Status: DC

## 2020-01-16 NOTE — Progress Notes (Signed)
PCP: Brunetta Jeans, PA-C   Primary EP: Dr Brien Few [name] is a 70 y.o. female who presents today for routine electrophysiology followup.  Since last being seen in our clinic, the patient reports doing reasonably well.   She continues to have episodes of afib.  + palpitations, fatigue and SOB. Today, she denies symptoms of chest pain, lower extremity edema, dizziness, presyncope, or syncope.  The patient is otherwise without complaint today.   Past Medical History:  Diagnosis Date  . Allergy   . Anemia   . Anxiety   . Arthritis    "neck and lower back" (03/25/2016)  . Asthma 1990s X 1   "short term inhaler use"   . CAD (coronary artery disease)   . Chronic lower back pain   . Degenerative disorder of bone   . Depression   . Diastolic dysfunction   . Drug-induced lupus erythematosus    HCTZ induced; "still gettin over it" (03/25/2016)  . GERD (gastroesophageal reflux disease)   . Herniated disc, cervical   . Hyperlipidemia   . Hypertension   . 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 Orthostatic hypotension   . OSA on CPAP   . Osteopenia   . PAF (paroxysmal atrial fibrillation) ([place])   . Pinched nerve in neck   . Sleep apnea    wears CPAP  . T12 compression fracture ([place]) 11/2015  . Vitamin D deficiency   . Whiplash injury 06/07/2010   Past Surgical History:  Procedure Laterality Date  . APPENDECTOMY  1990s  . ATRIAL FIBRILLATION ABLATION N/A 03/25/2016   Procedure: Atrial Fibrillation Ablation;  Surgeon: [name], MD;  Location: [facility];  Service: Cardiovascular;  Laterality: N/A;  . COLONOSCOPY    . FOREARM FRACTURE SURGERY Left ~ 02/2011   "broke arm; shattered wrist"  . FOREARM HARDWARE REMOVAL Left ~ 07/2011  . implantable loop recorder placement  03/07/2019   Medtronic Reveal Linq model LNQ 22 implantable loop recorder ([id number]) implanted by Dr Rayann Heman for Afib management  .  Spinal Nerve Ablation    . TEE WITHOUT CARDIOVERSION N/A 03/24/2016   Procedure: TRANSESOPHAGEAL ECHOCARDIOGRAM (TEE);  Surgeon: Sanda Klein, MD;  [facility];  Service: Cardiovascular;  Laterality: N/A;    ROS- all systems are reviewed and negatives except as per HPI above  Current Outpatient Medications  Medication Sig Dispense Refill  . acetaminophen (TYLENOL) 650 MG CR tablet Take 650-1,300 mg by mouth every 8 (eight) hours as needed for pain.     Marland Kitchen albuterol (VENTOLIN HFA) 108 (90 Base) MCG/ACT inhaler Inhale 1-2 puffs into the lungs every 6 (six) hours as needed for wheezing or shortness of breath. 8 g 2  . amLODipine (NORVASC) 10 MG tablet TAKE 1 TABLET BY MOUTH EVERY DAY 90 tablet 1  . carvedilol (COREG) 6.25 MG tablet TAKE 1 TABLET BY MOUTH IN THE MORNING AND 2 TABLETS BY MOUTH IN THE EVENING 270 tablet 3  . cholecalciferol (VITAMIN D) 1000 units tablet Take 1,000 Units by mouth daily.    . DULoxetine (CYMBALTA) 30 MG capsule TAKE 1 CAPSULE BY MOUTH EVERY DAY 90 capsule 1  . flecainide (TAMBOCOR) 50 MG tablet Take 2 tablets by mouth daily as needed for breakthrough afib. 60 tablet 2  . loratadine (CLARITIN) 10 MG tablet Take 10 mg by mouth daily.    Marland Kitchen LORazepam (ATIVAN) 0.5 MG tablet Take 1 tablet (0.5  mg total) by mouth 2 (two) times daily as needed for anxiety. 30 tablet 1  . rivaroxaban (XARELTO) 20 MG TABS tablet Take 1 tablet (20 mg total) by mouth daily with supper. 90 tablet 1  . triamcinolone (NASACORT) 55 MCG/ACT AERO nasal inhaler Place 2 sprays into the nose daily.     No current facility-administered medications for this visit.    Physical Exam: Vitals:   01/16/20 1453  BP: 126/70  Pulse: (!) 56  SpO2: 97%  Weight: 226 lb (102.5 kg)  Height: 5' 7" (1.702 m)    GEN- The patient is well appearing, alert and oriented x 3 today.   Head- normocephalic, atraumatic Eyes-  Sclera clear, conjunctiva pink Ears- hearing intact Oropharynx- clear Lungs-   normal work of breathing Heart- Regular rate and rhythm  GI- soft,  Extremities- no clubbing, cyanosis, or edema  Wt Readings from Last 3 Encounters:  01/16/20 226 lb (102.5 kg)  12/23/19 226 lb 12.8 oz (102.9 kg)  12/14/19 225 lb (102.1 kg)    EKG tracing ordered today is personally reviewed and shows sinus with PACs  Assessment and Plan:  1. Paroxysmal atrial fibrillation afib burden is 12% by [name] chads2vasc score is 3.  She is on xarelto  2. HTN Stable No change required today  3. Chronic diastolic dysfunction Stable No change required today  4. OSA Compliance with CPAP advised  5. Overweight Body mass index is 35.4 kg/m. Lifestyle modification advised  Risks, benefits and potential toxicities for medications prescribed and/or refilled reviewed with patient today.   Dhani Dannemiller MD, FACC 01/16/2020 3:19 PM     

## 2020-01-16 NOTE — H&P (View-Only) (Signed)
PCP: Brunetta Jeans, PA-C   Primary EP: Dr Brien Few Shannon [name] is a 70 y.o. female who presents today for routine electrophysiology followup.  Since last being seen in our clinic, the patient reports doing reasonably well.   She continues to have episodes of afib.  + palpitations, fatigue and SOB. Today, she denies symptoms of chest pain, lower extremity edema, dizziness, presyncope, or syncope.  The patient is otherwise without complaint today.   Past Medical History:  Diagnosis Date  . Allergy   . Anemia   . Anxiety   . Arthritis    "neck and lower back" (03/25/2016)  . Asthma 1990s X 1   "short term inhaler use"   . CAD (coronary artery disease)   . Chronic lower back pain   . Degenerative disorder of bone   . Depression   . Diastolic dysfunction   . Drug-induced lupus erythematosus    HCTZ induced; "still gettin over it" (03/25/2016)  . GERD (gastroesophageal reflux disease)   . Herniated disc, cervical   . Hyperlipidemia   . Hypertension   . 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 Orthostatic hypotension   . OSA on CPAP   . Osteopenia   . PAF (paroxysmal atrial fibrillation) ([place])   . Pinched nerve in neck   . Sleep apnea    wears CPAP  . T12 compression fracture ([place]) 11/2015  . Vitamin D deficiency   . Whiplash injury 06/07/2010   Past Surgical History:  Procedure Laterality Date  . APPENDECTOMY  1990s  . ATRIAL FIBRILLATION ABLATION N/A 03/25/2016   Procedure: Atrial Fibrillation Ablation;  Surgeon: [name], MD;  Location: [facility];  Service: Cardiovascular;  Laterality: N/A;  . COLONOSCOPY    . FOREARM FRACTURE SURGERY Left ~ 02/2011   "broke arm; shattered wrist"  . FOREARM HARDWARE REMOVAL Left ~ 07/2011  . implantable loop recorder placement  03/07/2019   Medtronic Reveal Linq model LNQ 22 implantable loop recorder ([id number]) implanted by Dr Rayann Heman for Afib management  .  Spinal Nerve Ablation    . TEE WITHOUT CARDIOVERSION N/A 03/24/2016   Procedure: TRANSESOPHAGEAL ECHOCARDIOGRAM (TEE);  Surgeon: Sanda Klein, MD;  Location: [facility] ENDOSCOPY;  Service: Cardiovascular;  Laterality: N/A;    ROS- all systems are reviewed and negatives except as per HPI above  Current Outpatient Medications  Medication Sig Dispense Refill  . acetaminophen (TYLENOL) 650 MG CR tablet Take 650-1,300 mg by mouth every 8 (eight) hours as needed for pain.     Marland Kitchen albuterol (VENTOLIN HFA) 108 (90 Base) MCG/ACT inhaler Inhale 1-2 puffs into the lungs every 6 (six) hours as needed for wheezing or shortness of breath. 8 g 2  . amLODipine (NORVASC) 10 MG tablet TAKE 1 TABLET BY MOUTH EVERY DAY 90 tablet 1  . carvedilol (COREG) 6.25 MG tablet TAKE 1 TABLET BY MOUTH IN THE MORNING AND 2 TABLETS BY MOUTH IN THE EVENING 270 tablet 3  . cholecalciferol (VITAMIN D) 1000 units tablet Take 1,000 Units by mouth daily.    . DULoxetine (CYMBALTA) 30 MG capsule TAKE 1 CAPSULE BY MOUTH EVERY DAY 90 capsule 1  . flecainide (TAMBOCOR) 50 MG tablet Take 2 tablets by mouth daily as needed for breakthrough afib. 60 tablet 2  . loratadine (CLARITIN) 10 MG tablet Take 10 mg by mouth daily.    Marland Kitchen LORazepam (ATIVAN) 0.5 MG tablet Take 1 tablet (0.5  mg total) by mouth 2 (two) times daily as needed for anxiety. 30 tablet 1  . rivaroxaban (XARELTO) 20 MG TABS tablet Take 1 tablet (20 mg total) by mouth daily with supper. 90 tablet 1  . triamcinolone (NASACORT) 55 MCG/ACT AERO nasal inhaler Place 2 sprays into the nose daily.     No current facility-administered medications for this visit.    Physical Exam: Vitals:   01/16/20 1453  BP: 126/70  Pulse: (!) 56  SpO2: 97%  Weight: 226 lb (102.5 kg)  Height: 5\' 7"  (1.702 m)    GEN- The patient is well appearing, alert and oriented x 3 today.   Head- normocephalic, atraumatic Eyes-  Sclera clear, conjunctiva pink Ears- hearing intact Oropharynx- clear Lungs-   normal work of breathing Heart- Regular rate and rhythm  GI- soft,  Extremities- no clubbing, cyanosis, or edema  Wt Readings from Last 3 Encounters:  01/16/20 226 lb (102.5 kg)  12/23/19 226 lb 12.8 oz (102.9 kg)  12/14/19 225 lb (102.1 kg)    EKG tracing ordered today is personally reviewed and shows sinus with PACs  Assessment and Plan:  1. Paroxysmal atrial fibrillation afib burden is 12% by [name] chads2vasc score is 3.  She is on xarelto  2. HTN Stable No change required today  3. Chronic diastolic dysfunction Stable No change required today  4. OSA Compliance with CPAP advised  5. Overweight Body mass index is 35.4 kg/m. Lifestyle modification advised  Risks, benefits and potential toxicities for medications prescribed and/or refilled reviewed with patient today.   [name] MD, [facility] 01/16/2020 3:19 PM

## 2020-01-16 NOTE — Patient Instructions (Addendum)
Medication Instructions:  Your physician recommends that you continue on your current medications as directed. Please refer to the Current Medication list given to you today.  Labwork: CBC, BMP  Testing/Procedures: None ordered.   Any Other Special Instructions Will Be Listed Below (If Applicable).  If you need a refill on your cardiac medications before your next appointment, please call your pharmacy.    Cardiac Ablation Cardiac ablation is a procedure to destroy (ablate) some heart tissue that is sending bad signals. These bad signals cause problems in heart rhythm. The heart has many areas that make these signals. If there are problems in these areas, they can make the heart beat in a way that is not normal. Destroying some tissues can help make the heart rhythm normal. Tell your doctor about:  Any allergies you have.  All medicines you are taking. These include vitamins, herbs, eye drops, creams, and over-the-counter medicines.  Any problems you or family members have had with medicines that make you fall asleep (anesthetics).  Any blood disorders you have.  Any surgeries you have had.  Any medical conditions you have, such as kidney failure.  Whether you are pregnant or may be pregnant. What are the risks? This is a safe procedure. But problems may occur, including:  Infection.  Bruising and bleeding.  Bleeding into the chest.  Stroke or blood clots.  Damage to nearby areas of your body.  Allergies to medicines or dyes.  The need for a pacemaker if the normal system is damaged.  Failure of the procedure to treat the problem. What happens before the procedure? Medicines Ask your doctor about:  Changing or stopping your normal medicines. This is important.  Taking aspirin and ibuprofen. Do not take these medicines unless your doctor tells you to take them.  Taking other medicines, vitamins, herbs, and supplements. General instructions  Follow  instructions from your doctor about what you cannot eat or drink.  Plan to have someone take you home from the hospital or clinic.  If you will be going home right after the procedure, plan to have someone with you for 24 hours.  Ask your doctor what steps will be taken to prevent infection. What happens during the procedure?  An IV tube will be put into one of your veins.  You will be given a medicine to help you relax.  The skin on your neck or groin will be numbed.  A cut (incision) will be made in your neck or groin. A needle will be put through your cut and into a large vein.  A tube (catheter) will be put into the needle. The tube will be moved to your heart.  Dye may be put through the tube. This helps your doctor see your heart.  Small devices (electrodes) on the tube will send out signals.  A type of energy will be used to destroy some heart tissue.  The tube will be taken out.  Pressure will be held on your cut. This helps stop bleeding.  A bandage will be put over your cut. The exact procedure may vary among doctors and hospitals.   What happens after the procedure?  You will be watched until you leave the hospital or clinic. This includes checking your heart rate, breathing rate, oxygen, and blood pressure.  Your cut will be watched for bleeding. You will need to lie still for a few hours.  Do not drive for 24 hours or as long as your doctor tells you. Summary  Cardiac ablation is a procedure to destroy some heart tissue. This is done to treat heart rhythm problems.  Tell your doctor about any medical conditions you may have. Tell him or her about all medicines you are taking to treat them.  This is a safe procedure. But problems may occur. These include infection, bruising, bleeding, and damage to nearby areas of your body.  Follow what your doctor tells you about food and drink. You may also be told to change or stop some of your medicines.  After the  procedure, do not drive for 24 hours or as long as your doctor tells you. This information is not intended to replace advice given to you by your health care provider. Make sure you discuss any questions you have with your health care provider. Document Revised: 11/25/2018 Document Reviewed: 11/25/2018 Elsevier Patient Education  2021 [employer].

## 2020-01-17 LAB — CBC WITH DIFFERENTIAL/PLATELET
Basophils Absolute: 0.1 10*3/uL (ref 0.0–0.2)
Basos: 1 %
EOS (ABSOLUTE): 0.4 10*3/uL (ref 0.0–0.4)
Eos: 4 %
Hematocrit: 38.4 % (ref 34.0–46.6)
Hemoglobin: 12.6 g/dL (ref 11.1–15.9)
Immature Grans (Abs): 0 10*3/uL (ref 0.0–0.1)
Immature Granulocytes: 0 %
Lymphocytes Absolute: 2 10*3/uL (ref 0.7–3.1)
Lymphs: 21 %
MCH: 29.2 pg (ref 26.6–33.0)
MCHC: 32.8 g/dL (ref 31.5–35.7)
MCV: 89 fL (ref 79–97)
Monocytes Absolute: 0.9 10*3/uL (ref 0.1–0.9)
Monocytes: 10 %
Neutrophils Absolute: 6.2 10*3/uL (ref 1.4–7.0)
Neutrophils: 64 %
Platelets: 327 10*3/uL (ref 150–450)
RBC: 4.32 x10E6/uL (ref 3.77–5.28)
RDW: 12.7 % (ref 11.7–15.4)
WBC: 9.6 10*3/uL (ref 3.4–10.8)

## 2020-01-17 LAB — BASIC METABOLIC PANEL
BUN/Creatinine Ratio: 15 (ref 12–28)
BUN: 14 mg/dL (ref 8–27)
CO2: 26 mmol/L (ref 20–29)
Calcium: 9.7 mg/dL (ref 8.7–10.3)
Chloride: 101 mmol/L (ref 96–106)
Creatinine, Ser: 0.96 mg/dL (ref 0.57–1.00)
GFR calc Af Amer: 70 mL/min/{1.73_m2} (ref >59)
GFR calc non Af Amer: 61 mL/min/{1.73_m2} (ref >59)
Glucose: 97 mg/dL (ref 65–99)
Potassium: 4.1 mmol/L (ref 3.5–5.2)
Sodium: 139 mmol/L (ref 134–144)

## 2020-01-20 ENCOUNTER — Telehealth (HOSPITAL_COMMUNITY): Payer: Self-pay | Admitting: Emergency Medicine

## 2020-01-20 NOTE — Telephone Encounter (Signed)
Reaching out to patient to offer assistance regarding upcoming cardiac imaging study; pt verbalizes understanding of appt date/time, parking situation and where to check in, pre-test NPO status and medications ordered, and verified current allergies; name and call back number provided for further questions should they arise Romesha Scherer RN Navigator Cardiac Imaging [facility] [phone] office [phone] cell 

## 2020-01-23 ENCOUNTER — Ambulatory Visit (INDEPENDENT_AMBULATORY_CARE_PROVIDER_SITE_OTHER): Payer: Medicare Other

## 2020-01-23 DIAGNOSIS — I480 Paroxysmal atrial fibrillation: Secondary | ICD-10-CM

## 2020-01-24 ENCOUNTER — Ambulatory Visit (HOSPITAL_COMMUNITY)
Admission: RE | Admit: 2020-01-24 | Discharge: 2020-01-24 | Disposition: A | Discharge status: Home / Self Care | Payer: Medicare Other | Source: Ambulatory Visit | Attending: Internal Medicine | Admitting: Internal Medicine

## 2020-01-24 ENCOUNTER — Other Ambulatory Visit: Payer: Self-pay

## 2020-01-24 DIAGNOSIS — I4891 Unspecified atrial fibrillation: Secondary | ICD-10-CM

## 2020-01-24 IMAGING — CT CT HEART MORPH/PULM VEIN W/ CM & W/O CA SCORE
2 of 7 series · 11 of 20 positions shown, 13 images · IV contrast (Omni 300)
Comparison: None.
COMPARISON: None.

Addendum:
EXAM:
OVER-READ INTERPRETATION  CT CHEST

The following report is an over-read performed by radiologist Dr.
LEDA [REDACTED] on [DATE]. This
over-read does not include interpretation of cardiac or coronary
anatomy or pathology. The coronary calcium score/coronary CTA
interpretation by the cardiologist is attached.
CLINICAL DATA: Atrial fibrillation scheduled for an ablation.
Cardiac CT/CTA
TECHNIQUE: The patient was scanned on a Siemens Force [REDACTED]ice  scanner.

[Series 10: 0-95% · axial · 0.36mm/px · z∈[-81,+9]mm · 5 of 2690 slices shown]
[im 449/2690  vessel]
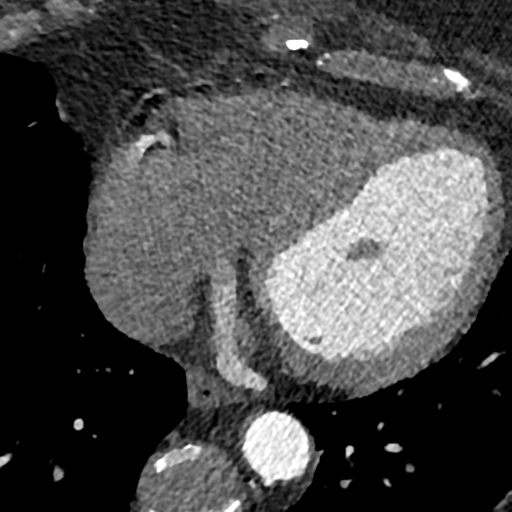
[im 897/2690  vessel]
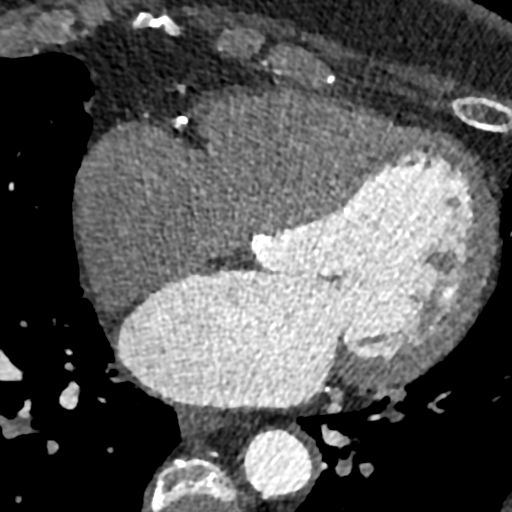
[im 1345/2690  vessel]
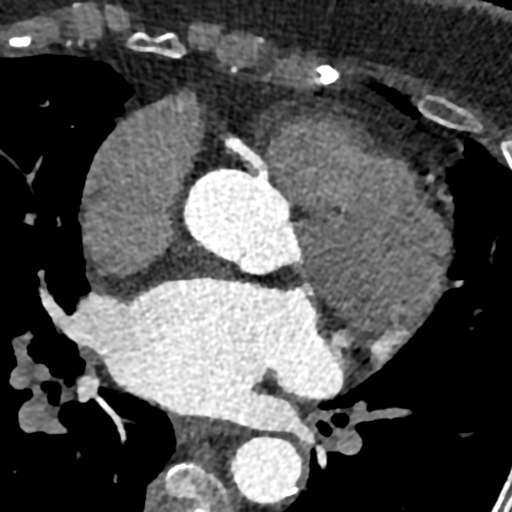
[im 1793/2690  vessel]
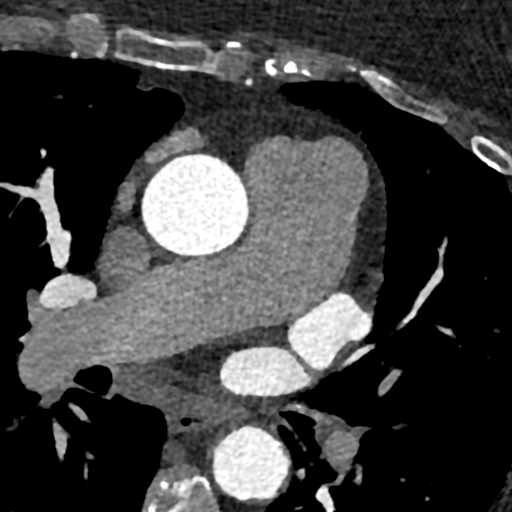
[im 2241/2690  vessel]
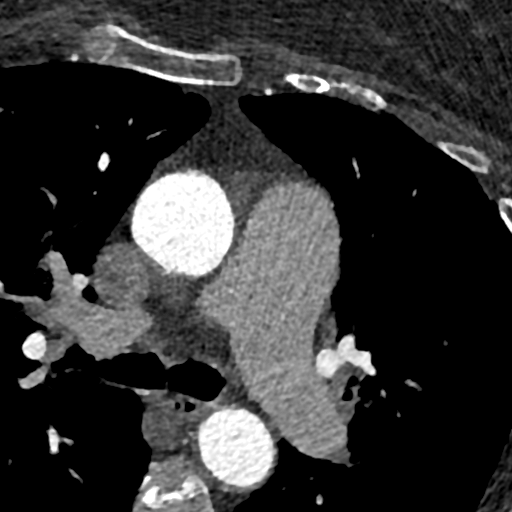

[Series 13: 5-95% · axial · 0.72mm/px · z∈[-84,+12]mm · 6 of 2690 slices shown, 8 images]
[im 385/2690  vessel]
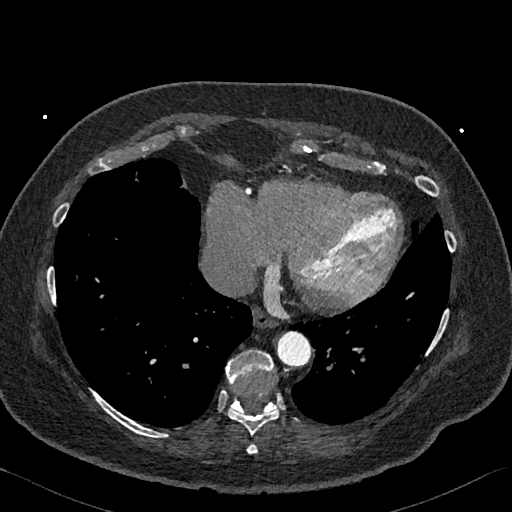
[im 385/2690  lung]
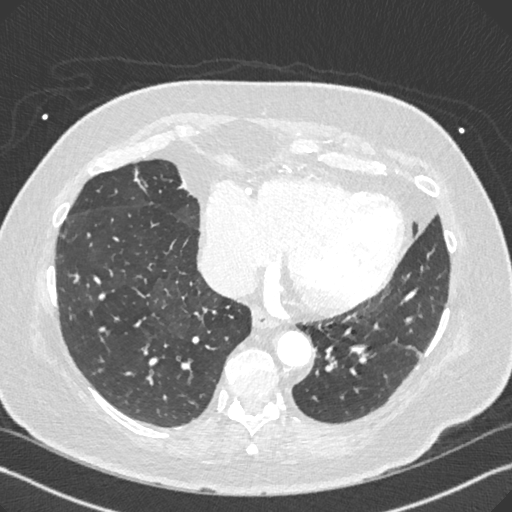
[im 769/2690  vessel]
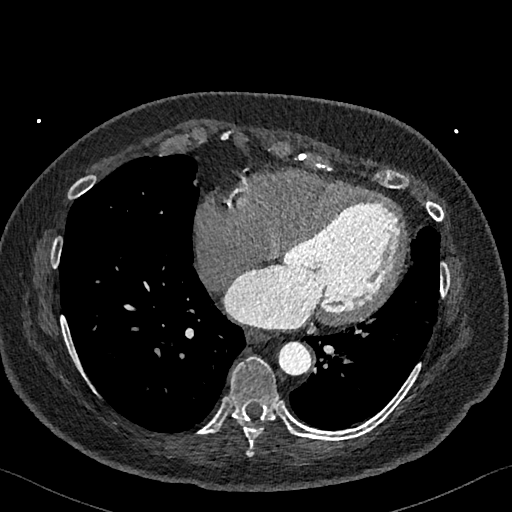
[im 1153/2690  vessel]
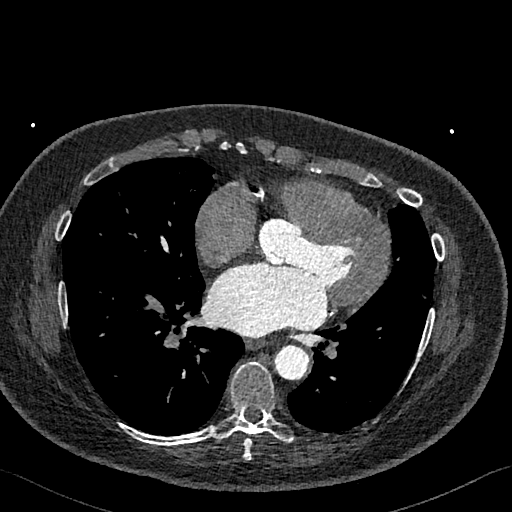
[im 1537/2690  vessel]
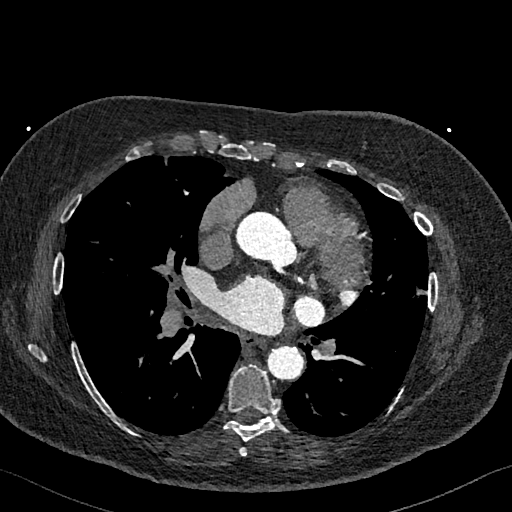
[im 1921/2690  vessel]
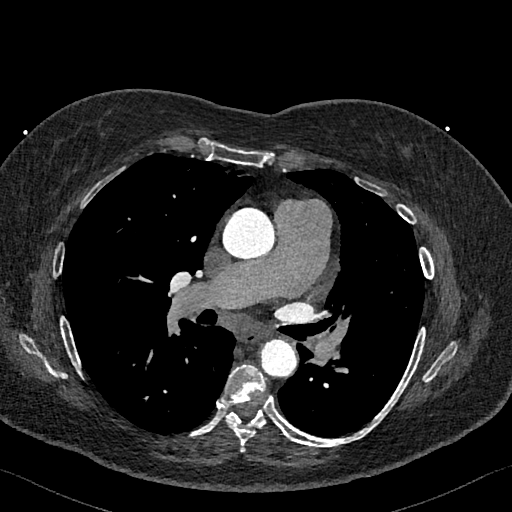
[im 1921/2690  lung]
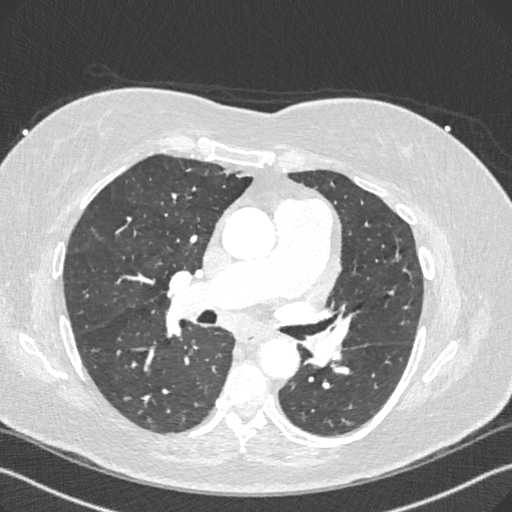
[im 2305/2690  vessel]
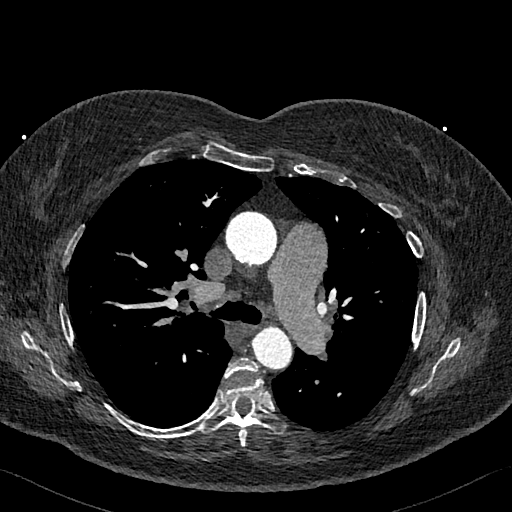

[11 of 20 positions shown; findings below may reference images not displayed]

FINDINGS: Aortic atherosclerosis. Dilatation of the pulmonic trunk (4.3 cm in
diameter). Within the visualized portions of the thorax there are no
suspicious appearing pulmonary nodules or masses, there is no acute
consolidative airspace disease, no pleural effusions, no
pneumothorax and no lymphadenopathy. Visualized portions of the
upper abdomen are unremarkable. There are no aggressive appearing
lytic or blastic lesions noted in the visualized portions of the
skeleton. Metallic density in the subcutaneous fat of the left
breast, presumably an implantable loop recorder.
IMPRESSION: 1.  Aortic Atherosclerosis ([MT]-[MT]).
2. Dilatation of the pulmonic trunk (4.3 cm in diameter), concerning
for pulmonary arterial hypertension.
FINDINGS: A 120 kV prospective scan was triggered in the ascending thoracic
aorta at 140 HU's. Gantry rotation speed was 250 msecs and
collimation was .6 mm. No beta blockade and no NTG was given. The 3D
data set was reconstructed for best systolic and diastolic phases
along with delayed images of the LEDA Images analyzed on a dedicated
work station using MPR, MIP and VRT modes. The patient received 80
cc of contrast.

Moderate bi atrial enlargement. No LEDA thrombus. Large appendage
with anterior and posterior lobes. No obvious ASD/PFO. No
pericardial effusion Normal aortic root 3.7 cm Dilated main
pulmonary artery 4.1 cm in double oblique view. Normal PV anatomy
with no anomaly Measurements below

RUPV: Ostium 17 mm   area 2.97 cm2

RLPV:  Ostium 17.3 mm   area 2.37 cm2

LUPV:  Ostium 16.8 mm  area 2.11 cm2

LLPV:  Ostium 13.4 mm   area 1.37 cm2

Calcium score LM and 3 vessel coronary calcium noted Score 562
IMPRESSION: 1.  No LEDA thrombus

2.  Moderate bi atrial enlargement

3.  Normal PV anatomy measurements above

4.  Dilated main pulmonary artery 4.1 cm

5.  No obvious PFO / ASD

6.  Normal aortic root 3.7 cm with aortic atherosclerosis

7.  Coronary calcium score 562 which is 94 th percentile for age/sex

LEDA

*** End of Addendum ***
EXAM:
OVER-READ INTERPRETATION  CT CHEST

The following report is an over-read performed by radiologist Dr.
LEDA [REDACTED] on [DATE]. This
over-read does not include interpretation of cardiac or coronary
anatomy or pathology. The coronary calcium score/coronary CTA
interpretation by the cardiologist is attached.
FINDINGS: Aortic atherosclerosis. Dilatation of the pulmonic trunk (4.3 cm in
diameter). Within the visualized portions of the thorax there are no
suspicious appearing pulmonary nodules or masses, there is no acute
consolidative airspace disease, no pleural effusions, no
pneumothorax and no lymphadenopathy. Visualized portions of the
upper abdomen are unremarkable. There are no aggressive appearing
lytic or blastic lesions noted in the visualized portions of the
skeleton. Metallic density in the subcutaneous fat of the left
breast, presumably an implantable loop recorder.
IMPRESSION: 1.  Aortic Atherosclerosis ([MT]-[MT]).
2. Dilatation of the pulmonic trunk (4.3 cm in diameter), concerning
for pulmonary arterial hypertension.

## 2020-01-24 MED ORDER — IOHEXOL 350 MG/ML SOLN
100.0000 mL | Freq: Once | INTRAVENOUS | Status: AC | PRN
  Administered 2020-01-24: 100 mL via INTRAVENOUS

## 2020-01-26 LAB — CUP PACEART REMOTE DEVICE CHECK
Date Time Interrogation Session: 20220120152612
Implantable Pulse Generator Implant Date: 20210301

## 2020-01-28 ENCOUNTER — Other Ambulatory Visit (HOSPITAL_COMMUNITY)
Admission: RE | Admit: 2020-01-28 | Discharge: 2020-01-28 | Disposition: A | Discharge status: Home / Self Care | Payer: Medicare Other | Source: Ambulatory Visit | Attending: Internal Medicine | Admitting: Internal Medicine

## 2020-01-28 DIAGNOSIS — Z20822 Contact with and (suspected) exposure to covid-19: Secondary | ICD-10-CM | POA: Insufficient documentation

## 2020-01-28 DIAGNOSIS — Z01812 Encounter for preprocedural laboratory examination: Secondary | ICD-10-CM | POA: Insufficient documentation

## 2020-01-28 LAB — SARS CORONAVIRUS 2 (TAT 6-24 HRS): SARS Coronavirus 2: NEGATIVE

## 2020-01-30 NOTE — Progress Notes (Signed)
Instructed patient on the following items: Arrival time 0730 Nothing to eat or drink after midnight No meds AM of procedure Responsible person to drive you home and stay with you for 24 hrs  Have you missed any doses of anti-coagulant Xarelto- hasn't missed any doses

## 2020-01-31 ENCOUNTER — Ambulatory Visit (HOSPITAL_COMMUNITY): Payer: Medicare Other | Admitting: Certified Registered Nurse Anesthetist

## 2020-01-31 ENCOUNTER — Encounter (HOSPITAL_COMMUNITY)
Admission: RE | Disposition: A | Discharge status: Home / Self Care | Payer: Self-pay | Source: Home / Self Care | Attending: Internal Medicine

## 2020-01-31 ENCOUNTER — Other Ambulatory Visit: Payer: Self-pay

## 2020-01-31 ENCOUNTER — Ambulatory Visit (HOSPITAL_COMMUNITY)
Admission: RE | Admit: 2020-01-31 | Discharge: 2020-01-31 | Disposition: A | Discharge status: Home / Self Care | Payer: Medicare Other | Attending: Internal Medicine | Admitting: Internal Medicine

## 2020-01-31 ENCOUNTER — Telehealth: Payer: Self-pay

## 2020-01-31 ENCOUNTER — Encounter (HOSPITAL_COMMUNITY): Payer: Self-pay | Admitting: Internal Medicine

## 2020-01-31 DIAGNOSIS — Z79899 Other long term (current) drug therapy: Secondary | ICD-10-CM | POA: Diagnosis not present

## 2020-01-31 DIAGNOSIS — I10 Essential (primary) hypertension: Secondary | ICD-10-CM | POA: Insufficient documentation

## 2020-01-31 DIAGNOSIS — I480 Paroxysmal atrial fibrillation: Secondary | ICD-10-CM | POA: Diagnosis not present

## 2020-01-31 DIAGNOSIS — Z6835 Body mass index (BMI) 35.0-35.9, adult: Secondary | ICD-10-CM | POA: Insufficient documentation

## 2020-01-31 DIAGNOSIS — G4733 Obstructive sleep apnea (adult) (pediatric): Secondary | ICD-10-CM | POA: Insufficient documentation

## 2020-01-31 DIAGNOSIS — Z7901 Long term (current) use of anticoagulants: Secondary | ICD-10-CM | POA: Insufficient documentation

## 2020-01-31 DIAGNOSIS — E663 Overweight: Secondary | ICD-10-CM | POA: Insufficient documentation

## 2020-01-31 HISTORY — PX: ATRIAL FIBRILLATION ABLATION: EP1191

## 2020-01-31 SURGERY — ATRIAL FIBRILLATION ABLATION
Anesthesia: General

## 2020-01-31 MED ORDER — HEPARIN SODIUM (PORCINE) 1000 UNIT/ML IJ SOLN
INTRAMUSCULAR | Status: DC | PRN
  Administered 2020-01-31: 1000 [IU] via INTRAVENOUS
  Administered 2020-01-31: 15000 [IU] via INTRAVENOUS

## 2020-01-31 MED ORDER — SUGAMMADEX SODIUM 200 MG/2ML IV SOLN
INTRAVENOUS | Status: DC | PRN
  Administered 2020-01-31: 200 mg via INTRAVENOUS

## 2020-01-31 MED ORDER — HEPARIN (PORCINE) IN NACL 1000-0.9 UT/500ML-% IV SOLN
INTRAVENOUS | Status: DC | PRN
  Administered 2020-01-31: 500 mL

## 2020-01-31 MED ORDER — HEPARIN SODIUM (PORCINE) 1000 UNIT/ML IJ SOLN
INTRAMUSCULAR | Status: DC | PRN
  Administered 2020-01-31 (×2): 1000 [IU] via INTRAVENOUS

## 2020-01-31 MED ORDER — ISOPROTERENOL HCL 0.2 MG/ML IJ SOLN
INTRAVENOUS | Status: DC | PRN
  Administered 2020-01-31: 10 ug/min via INTRAVENOUS

## 2020-01-31 MED ORDER — EPHEDRINE SULFATE-NACL 50-0.9 MG/10ML-% IV SOSY
PREFILLED_SYRINGE | INTRAVENOUS | Status: DC | PRN
  Administered 2020-01-31: 10 mg via INTRAVENOUS
  Administered 2020-01-31: 5 mg via INTRAVENOUS

## 2020-01-31 MED ORDER — HEPARIN SODIUM (PORCINE) 1000 UNIT/ML IJ SOLN
INTRAMUSCULAR | Status: AC
  Filled 2020-01-31: qty 1

## 2020-01-31 MED ORDER — DEXAMETHASONE SODIUM PHOSPHATE 10 MG/ML IJ SOLN
INTRAMUSCULAR | Status: DC | PRN
  Administered 2020-01-31: 5 mg via INTRAVENOUS

## 2020-01-31 MED ORDER — PHENYLEPHRINE 40 MCG/ML (10ML) SYRINGE FOR IV PUSH (FOR BLOOD PRESSURE SUPPORT)
PREFILLED_SYRINGE | INTRAVENOUS | Status: DC | PRN
  Administered 2020-01-31 (×2): 80 ug via INTRAVENOUS

## 2020-01-31 MED ORDER — PHENYLEPHRINE HCL-NACL 10-0.9 MG/250ML-% IV SOLN
INTRAVENOUS | Status: DC | PRN
  Administered 2020-01-31: 25 ug/min via INTRAVENOUS

## 2020-01-31 MED ORDER — PROTAMINE SULFATE 10 MG/ML IV SOLN
INTRAVENOUS | Status: DC | PRN
  Administered 2020-01-31 (×2): 20 mg via INTRAVENOUS

## 2020-01-31 MED ORDER — SODIUM CHLORIDE 0.9 % IV SOLN
INTRAVENOUS | Status: AC | PRN
  Administered 2020-01-31: 250 mL via INTRAVENOUS

## 2020-01-31 MED ORDER — SODIUM CHLORIDE 0.9 % IV SOLN: INTRAVENOUS | Status: DC

## 2020-01-31 MED ORDER — MIDAZOLAM HCL 5 MG/5ML IJ SOLN
INTRAMUSCULAR | Status: DC | PRN
  Administered 2020-01-31: 2 mg via INTRAVENOUS

## 2020-01-31 MED ORDER — PANTOPRAZOLE SODIUM 40 MG PO TBEC: 40.0000 mg | DELAYED_RELEASE_TABLET | Freq: Every day | ORAL | 0 refills | Status: DC

## 2020-01-31 MED ORDER — PROPOFOL 10 MG/ML IV BOLUS
INTRAVENOUS | Status: DC | PRN
  Administered 2020-01-31: 150 mg via INTRAVENOUS

## 2020-01-31 MED ORDER — FENTANYL CITRATE (PF) 100 MCG/2ML IJ SOLN
INTRAMUSCULAR | Status: DC | PRN
  Administered 2020-01-31: 100 ug via INTRAVENOUS

## 2020-01-31 MED ORDER — SODIUM CHLORIDE 0.9% FLUSH: 3.0000 mL | Freq: Two times a day (BID) | INTRAVENOUS | Status: DC

## 2020-01-31 MED ORDER — LIDOCAINE 2% (20 MG/ML) 5 ML SYRINGE
INTRAMUSCULAR | Status: DC | PRN
  Administered 2020-01-31: 100 mg via INTRAVENOUS

## 2020-01-31 MED ORDER — ISOPROTERENOL HCL 0.2 MG/ML IJ SOLN
INTRAMUSCULAR | Status: AC
  Filled 2020-01-31: qty 5

## 2020-01-31 MED ORDER — ONDANSETRON HCL 4 MG/2ML IJ SOLN: 4.0000 mg | Freq: Four times a day (QID) | INTRAMUSCULAR | Status: DC | PRN

## 2020-01-31 MED ORDER — ONDANSETRON HCL 4 MG/2ML IJ SOLN
INTRAMUSCULAR | Status: DC | PRN
  Administered 2020-01-31: 4 mg via INTRAVENOUS

## 2020-01-31 MED ORDER — SODIUM CHLORIDE 0.9 % IV SOLN: 250.0000 mL | INTRAVENOUS | Status: DC | PRN

## 2020-01-31 MED ORDER — ROCURONIUM BROMIDE 10 MG/ML (PF) SYRINGE
PREFILLED_SYRINGE | INTRAVENOUS | Status: DC | PRN
  Administered 2020-01-31: 70 mg via INTRAVENOUS

## 2020-01-31 MED ORDER — SODIUM CHLORIDE 0.9% FLUSH: 3.0000 mL | INTRAVENOUS | Status: DC | PRN

## 2020-01-31 MED ORDER — HEPARIN (PORCINE) IN NACL 1000-0.9 UT/500ML-% IV SOLN
INTRAVENOUS | Status: AC
  Filled 2020-01-31: qty 500

## 2020-01-31 MED ORDER — ACETAMINOPHEN 325 MG PO TABS
650.0000 mg | ORAL_TABLET | ORAL | Status: DC | PRN
  Filled 2020-01-31: qty 2

## 2020-01-31 SURGICAL SUPPLY — 19 items
BLANKET WARM UNDERBOD FULL ACC (MISCELLANEOUS) ×2 IMPLANT
CATH MAPPNG PENTARAY F 2-6-2MM (CATHETERS) ×1 IMPLANT
CATH SMTCH THERMOCOOL SF DF (CATHETERS) ×2 IMPLANT
CATH SOUNDSTAR ECO 8FR (CATHETERS) ×2 IMPLANT
CATH WEB BI DIR CSDF CRV REPRO (CATHETERS) ×2 IMPLANT
CLOSURE PERCLOSE PROSTYLE (VASCULAR PRODUCTS) ×6 IMPLANT
COVER SWIFTLINK CONNECTOR (BAG) ×2 IMPLANT
MAT PREVALON FULL STRYKER (MISCELLANEOUS) ×2 IMPLANT
NEEDLE BAYLIS TRANSSEPTAL 71CM (NEEDLE) ×2 IMPLANT
PACK EP LATEX FREE (CUSTOM PROCEDURE TRAY) ×2
PACK EP LF (CUSTOM PROCEDURE TRAY) ×1 IMPLANT
PAD PRO RADIOLUCENT 2001M-C (PAD) ×2 IMPLANT
PATCH CARTO3 (PAD) ×2 IMPLANT
PENTARAY F 2-6-2MM (CATHETERS) ×2
SHEATH PINNACLE 7F 10CM (SHEATH) ×4 IMPLANT
SHEATH PINNACLE 9F 10CM (SHEATH) ×2 IMPLANT
SHEATH PROBE COVER 6X72 (BAG) ×2 IMPLANT
SHEATH SWARTZ TS SL2 63CM 8.5F (SHEATH) ×2 IMPLANT
TUBING SMART ABLATE COOLFLOW (TUBING) ×2 IMPLANT

## 2020-01-31 NOTE — Interval H&P Note (Signed)
History and Physical Interval Note:  01/31/2020 9:26 AM  Shannon [name]  has presented today for surgery, with the diagnosis of afib.  The various methods of treatment have been discussed with the patient and family. After consideration of risks, benefits and other options for treatment, the patient has consented to  Procedure(s): ATRIAL FIBRILLATION ABLATION (N/A) as a surgical intervention.  The patient's history has been reviewed, patient examined, no change in status, stable for surgery.  I have reviewed the patient's chart and labs.  Questions were answered to the patient's satisfaction.    She reports compliance with xarelto without interruption. Cardiac CT reviewed at length.  She declines ischemic symptoms at this time.  Risk, benefits, and alternatives to EP study and radiofrequency ablation for afib were also discussed in detail today. These risks include but are not limited to stroke, bleeding, vascular damage, tamponade, perforation, damage to the esophagus, lungs, and other structures, pulmonary vein stenosis, worsening renal function, and death. The patient understands these risk and wishes to proceed.    [name]

## 2020-01-31 NOTE — Progress Notes (Signed)
Up and walked and tolerated well; right groin stable, no bleeding or hematoma 

## 2020-01-31 NOTE — Anesthesia Preprocedure Evaluation (Addendum)
Anesthesia Evaluation  Patient identified by MRN, date of birth, ID band Patient awake    Reviewed: Allergy & Precautions, H&P , NPO status , Patient's Chart, lab work & pertinent test results  Airway Mallampati: I  TM Distance: >3 FB Neck ROM: Full    Dental  (+) Dental Advidsory Given, Teeth Intact, Missing,    Pulmonary neg pulmonary ROS, sleep apnea and Continuous Positive Airway Pressure Ventilation , former smoker,    Pulmonary exam normal        Cardiovascular hypertension, Pt. on medications and Pt. on home beta blockers +CHF  Normal cardiovascular exam     Neuro/Psych PSYCHIATRIC DISORDERS Anxiety Depression  Neuromuscular disease    GI/Hepatic GERD  Medicated and Controlled,  Endo/Other    Renal/GU   negative genitourinary   Musculoskeletal  (+) Arthritis ,   Abdominal (+) + obese,   Peds  Hematology   Anesthesia Other Findings   Reproductive/Obstetrics                            Anesthesia Physical  Anesthesia Plan  ASA: III  Anesthesia Plan: General   Post-op Pain Management:    Induction: Intravenous  PONV Risk Score and Plan:   Airway Management Planned: LMA  Additional Equipment: None  Intra-op Plan:   Post-operative Plan: Extubation in OR  Informed Consent: I have reviewed the patients History and Physical, chart, labs and discussed the procedure including the risks, benefits and alternatives for the proposed anesthesia with the patient or authorized representative who has indicated his/her understanding and acceptance.     Dental Advisory Given  Plan Discussed with: CRNA  Anesthesia Plan Comments:         Anesthesia Quick Evaluation

## 2020-01-31 NOTE — Discharge Instructions (Signed)
Cardiac Ablation, Care After This sheet gives you information about how to care for yourself after your procedure. Your health care provider may also give you more specific instructions. If you have problems or questions, contact your health care provider. What can I expect after the procedure? After the procedure, it is common to have:  Bruising around the insertion site.  Tenderness around the insertion site.  Skipped heartbeats.  Tiredness (fatigue). Follow these instructions at home: Insertion site care  Follow instructions from your health care provider about how to take care of your insertion site. Make sure you: ? Wash your hands with soap and water for at least 20 seconds before and after you change your bandage (dressing). If soap and water are not available, use hand sanitizer. ? Change your dressing as told by your health care provider. ? Leave stitches (sutures), skin glue, or adhesive strips in place. These skin closures may need to stay in place for up to 2 weeks. If adhesive strip edges start to loosen and curl up, you may trim the loose edges. Do not remove adhesive strips completely unless your health care provider tells you to do that.  Check your insertion site every day for signs of infection. Check for: ? More redness, swelling, or pain. ? Fluid or blood. ? Warmth. ? Pus or a bad smell.  If your insertion site starts to bleed, lie down on your back, apply firm pressure to the area, and contact your health care provider.   Driving  If you were given a sedative during the procedure, it can affect you for several hours. Do not drive or operate machinery until your health care provider says that it is safe.  Ask your health care provider when it is safe for you to drive again after the procedure.   Activity  Avoid activities that take a lot of effort for at least 3 days after your procedure.  Do not lift anything that is heavier than 10 lb (4.5 kg), or the limit  that you are told, until your health care provider says that it is safe.  Return to your normal activities as told by your health care provider. Ask your health care provider what activities are safe for you.   General instructions  Take over-the-counter and prescription medicines only as told by your health care provider.  Do not use any products that contain nicotine or tobacco, such as cigarettes, e-cigarettes, and chewing tobacco. If you need help quitting, ask your health care provider.  Do not take baths, swim, or use a hot tub until your health care provider approves. Ask your health care provider if you may take showers. You may only be allowed to take sponge baths.  Do not drink alcohol for 24 hours after your procedure.  Keep all follow-up visits as told by your health care provider. This is important. Contact a health care provider if you:  Notice these things around the catheter insertion site: ? More redness, swelling, or pain. ? Fluid or blood that stops after applying firm pressure to the area. ? Warmth when you touch the area. ? Pus or a bad smell.  Have a fever.  Are sweating a lot.  Feel nauseous.  Have pain or numbness in the arm or leg closest to your insertion site. Get help right away if:  Your insertion site suddenly swells.  Your insertion site is bleeding and the bleeding does not stop after applying firm pressure to the area.  You  have chest pain or discomfort that spreads to your neck, jaw, or arm.  You have a fast or irregular heartbeat.  You have shortness of breath.  You are dizzy or light-headed and feel the need to lie down. These symptoms may represent a serious problem that is an emergency. Do not wait to see if the symptoms will go away. Get medical help right away. Call your local emergency services (911 in the [place] Do not drive yourself to the hospital. Summary  After the procedure, it is common to have bruising and tenderness at the  insertion site in your groin or your neck.  Check your insertion site every day for more redness, swelling, or pain. These are signs of infection.  Contact a health care provider if you notice any signs of infection. Also, contact a health care provider if you start sweating a lot, feel nauseous, or have pain or numbness in the arm or leg closest to your insertion site.  Get help right away if your puncture site is bleeding and the bleeding does not stop after applying firm pressure to the area. This is a medical emergency. This information is not intended to replace advice given to you by your health care provider. Make sure you discuss any questions you have with your health care provider. Document Revised: 11/01/2018 Document Reviewed: 11/01/2018 Elsevier Patient Education  [employer].

## 2020-01-31 NOTE — Transfer of Care (Signed)
Immediate Anesthesia Transfer of Care Note  Patient: Shannon [name]  Procedure(s) Performed: ATRIAL FIBRILLATION ABLATION (N/A )  Patient Location: Cath Lab  Anesthesia Type:General  Level of Consciousness: awake and drowsy  Airway & Oxygen Therapy: Patient Spontanous Breathing and Patient connected to nasal cannula oxygen  Post-op Assessment: Report given to RN, Post -op Vital signs reviewed and stable and Patient moving all extremities X 4  Post vital signs: Reviewed and stable  Last Vitals:  Vitals Value Taken Time  BP    Temp    Pulse    Resp    SpO2      Last Pain:  Vitals:   01/31/20 0819  TempSrc:   PainSc: 6       Patients Stated Pain Goal: 4 (09/40/76 8088)  Complications: No complications documented.

## 2020-01-31 NOTE — Anesthesia Procedure Notes (Signed)
Procedure Name: Intubation Date/Time: 01/31/2020 9:55 AM Performed by: Harden Mo, CRNA Pre-anesthesia Checklist: Patient identified, Emergency Drugs available, Suction available and Patient being monitored Patient Re-evaluated:Patient Re-evaluated prior to induction Oxygen Delivery Method: Circle System Utilized Preoxygenation: Pre-oxygenation with 100% oxygen Induction Type: IV induction Ventilation: Mask ventilation without difficulty and Oral airway inserted - appropriate to patient size Laryngoscope Size: Sabra Heck and 2 Grade View: Grade I Tube type: Oral Tube size: 7.5 mm Number of attempts: 1 Airway Equipment and Method: Stylet and Oral airway Placement Confirmation: ETT inserted through vocal cords under direct vision,  positive ETCO2 and breath sounds checked- equal and bilateral Secured at: 22 cm Tube secured with: Tape Dental Injury: Teeth and Oropharynx as per pre-operative assessment

## 2020-01-31 NOTE — Chronic Care Management (AMB) (Signed)
    Chronic Care Management Pharmacy Assistant   Name: Shannon [name]  MRN: [id number] DOB: 01-13-1950  Reason for Encounter: Schedule follow up appointment  I reached out to the patient to schedule her follow up appointment with clinical pharmacist Madelin Rear, Pharm.D., BCGP. I left a message for the patient to return my call [phone].  April D Calhoun, [place] Pharmacist Assistant [phone]

## 2020-02-01 ENCOUNTER — Encounter (HOSPITAL_COMMUNITY): Payer: Self-pay | Admitting: Internal Medicine

## 2020-02-01 NOTE — Anesthesia Postprocedure Evaluation (Signed)
Anesthesia Post Note  Patient: Shannon [name]  Procedure(s) Performed: ATRIAL FIBRILLATION ABLATION (N/A )     Patient location during evaluation: Endoscopy Anesthesia Type: General Level of consciousness: awake and sedated Pain management: pain level controlled Vital Signs Assessment: post-procedure vital signs reviewed and stable Respiratory status: spontaneous breathing Cardiovascular status: stable Postop Assessment: no apparent nausea or vomiting Anesthetic complications: no   No complications documented.  Last Vitals:  Vitals:   01/31/20 1340 01/31/20 1355  BP: (!) 155/63 (!) 149/68  Pulse: 63 65  Resp: (!) 23 (!) 23  Temp:    SpO2: 96% 93%    Last Pain:  Vitals:   02/01/20 1220  TempSrc:   PainSc: 0-No pain   Pain Goal: Patients Stated Pain Goal: 0 (01/31/20 1202)                 Huston Foley

## 2020-02-03 ENCOUNTER — Ambulatory Visit: Payer: Medicare Other | Admitting: Adult Health

## 2020-02-03 LAB — POCT ACTIVATED CLOTTING TIME
Activated Clotting Time: 321 seconds
Activated Clotting Time: 338 seconds

## 2020-02-06 NOTE — Progress Notes (Signed)
Carelink Summary Report / Loop Recorder 

## 2020-02-10 ENCOUNTER — Telehealth: Payer: Self-pay | Admitting: Emergency Medicine

## 2020-02-10 NOTE — Telephone Encounter (Signed)
Patient reports that she ha increased fatigue, SOB with activity and palpitations 02/09/20. She reports on 02/09/20 she took an extra 100 mg of Flecainide at 1730 and then her flecainide 50 mg dose at HS. + Xarelto. She recently had ablation 01/31/20. She reports she feels better today. Patient has LINQ II and unable to send manual transmission with this device. Will forward to [facility] Clinic for evaluation.

## 2020-02-13 NOTE — Telephone Encounter (Signed)
Pt back in nsr as of 2/4. Will continue to monitor.

## 2020-02-17 ENCOUNTER — Other Ambulatory Visit (HOSPITAL_COMMUNITY): Payer: Medicare Other

## 2020-02-20 ENCOUNTER — Ambulatory Visit: Payer: Medicare Other | Admitting: Adult Health

## 2020-02-20 ENCOUNTER — Telehealth (HOSPITAL_COMMUNITY): Payer: Self-pay | Admitting: *Deleted

## 2020-02-20 NOTE — Telephone Encounter (Signed)
Patient states she woke up this morning in AF HRs 53-127 BP 111/72 feels ok just notices her heart rate "bouncing all around".  Pt has not taken any extra medications since the start of current episode. She will take an extra 50mg  of flecainide now and her normal dose this evening. If still out tomorrow can bring in for assessment/medication changes. Pt in agreement.

## 2020-02-23 ENCOUNTER — Ambulatory Visit (INDEPENDENT_AMBULATORY_CARE_PROVIDER_SITE_OTHER): Payer: Medicare Other

## 2020-02-23 DIAGNOSIS — I480 Paroxysmal atrial fibrillation: Secondary | ICD-10-CM

## 2020-02-28 LAB — CUP PACEART REMOTE DEVICE CHECK
Date Time Interrogation Session: 20220222163533
Implantable Pulse Generator Implant Date: 20210301

## 2020-02-29 ENCOUNTER — Ambulatory Visit (HOSPITAL_COMMUNITY)
Admit: 2020-02-29 | Discharge: 2020-02-29 | Disposition: A | Discharge status: Home / Self Care | Payer: Medicare Other | Source: Ambulatory Visit | Attending: Nurse Practitioner | Admitting: Nurse Practitioner

## 2020-02-29 ENCOUNTER — Encounter (HOSPITAL_COMMUNITY): Payer: Self-pay | Admitting: Nurse Practitioner

## 2020-02-29 ENCOUNTER — Other Ambulatory Visit: Payer: Self-pay

## 2020-02-29 VITALS — BP 134/80 | HR 61 | Ht 67.0 in | Wt 227.4 lb

## 2020-02-29 DIAGNOSIS — I4891 Unspecified atrial fibrillation: Secondary | ICD-10-CM | POA: Insufficient documentation

## 2020-02-29 DIAGNOSIS — Z87891 Personal history of nicotine dependence: Secondary | ICD-10-CM | POA: Insufficient documentation

## 2020-02-29 DIAGNOSIS — D6869 Other thrombophilia: Secondary | ICD-10-CM

## 2020-02-29 DIAGNOSIS — Z888 Allergy status to other drugs, medicaments and biological substances status: Secondary | ICD-10-CM | POA: Diagnosis not present

## 2020-02-29 DIAGNOSIS — Z885 Allergy status to narcotic agent status: Secondary | ICD-10-CM | POA: Insufficient documentation

## 2020-02-29 DIAGNOSIS — Z7901 Long term (current) use of anticoagulants: Secondary | ICD-10-CM | POA: Diagnosis not present

## 2020-02-29 DIAGNOSIS — I480 Paroxysmal atrial fibrillation: Secondary | ICD-10-CM

## 2020-02-29 DIAGNOSIS — I10 Essential (primary) hypertension: Secondary | ICD-10-CM | POA: Diagnosis not present

## 2020-02-29 DIAGNOSIS — Z8249 Family history of ischemic heart disease and other diseases of the circulatory system: Secondary | ICD-10-CM | POA: Insufficient documentation

## 2020-02-29 DIAGNOSIS — R0602 Shortness of breath: Secondary | ICD-10-CM | POA: Insufficient documentation

## 2020-02-29 DIAGNOSIS — Z79899 Other long term (current) drug therapy: Secondary | ICD-10-CM | POA: Insufficient documentation
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
## 2020-02-29 NOTE — Progress Notes (Signed)
Carelink Summary Report / Loop Recorder 

## 2020-03-09 ENCOUNTER — Other Ambulatory Visit: Payer: Self-pay | Admitting: Internal Medicine

## 2020-03-09 ENCOUNTER — Other Ambulatory Visit (HOSPITAL_COMMUNITY)
Admission: RE | Admit: 2020-03-09 | Discharge: 2020-03-09 | Disposition: A | Discharge status: Home / Self Care | Payer: Medicare Other | Source: Ambulatory Visit | Attending: Adult Health | Admitting: Adult Health

## 2020-03-09 DIAGNOSIS — Z01812 Encounter for preprocedural laboratory examination: Secondary | ICD-10-CM | POA: Insufficient documentation

## 2020-03-09 DIAGNOSIS — Z20822 Contact with and (suspected) exposure to covid-19: Secondary | ICD-10-CM | POA: Diagnosis not present

## 2020-03-09 LAB — SARS CORONAVIRUS 2 (TAT 6-24 HRS): SARS Coronavirus 2: NEGATIVE

## 2020-03-09 NOTE — Telephone Encounter (Signed)
Pt's pharmacy is requesting a refill on pantoprazole. Would Dr. Rayann Heman like to refill this medication, If not can it be taken off pt's medication list? Please address

## 2020-03-12 ENCOUNTER — Ambulatory Visit: Payer: Medicare Other | Admitting: Adult Health

## 2020-03-14 ENCOUNTER — Other Ambulatory Visit: Payer: Self-pay

## 2020-03-14 MED ORDER — FLECAINIDE ACETATE 50 MG PO TABS: 50.0000 mg | ORAL_TABLET | Freq: Two times a day (BID) | ORAL | 3 refills | Status: DC

## 2020-03-23 ENCOUNTER — Other Ambulatory Visit (HOSPITAL_COMMUNITY)
Admission: RE | Admit: 2020-03-23 | Discharge: 2020-03-23 | Disposition: A | Discharge status: Home / Self Care | Payer: Medicare Other | Source: Ambulatory Visit | Attending: Adult Health | Admitting: Adult Health

## 2020-03-23 DIAGNOSIS — Z01812 Encounter for preprocedural laboratory examination: Secondary | ICD-10-CM | POA: Diagnosis present

## 2020-03-23 DIAGNOSIS — Z20822 Contact with and (suspected) exposure to covid-19: Secondary | ICD-10-CM | POA: Insufficient documentation

## 2020-03-23 LAB — SARS CORONAVIRUS 2 (TAT 6-24 HRS): SARS Coronavirus 2: NEGATIVE

## 2020-03-26 ENCOUNTER — Encounter: Payer: Self-pay | Admitting: Adult Health

## 2020-03-26 ENCOUNTER — Other Ambulatory Visit: Payer: Self-pay

## 2020-03-26 ENCOUNTER — Ambulatory Visit (INDEPENDENT_AMBULATORY_CARE_PROVIDER_SITE_OTHER): Payer: Medicare Other | Admitting: Pulmonary Disease

## 2020-03-26 ENCOUNTER — Ambulatory Visit (INDEPENDENT_AMBULATORY_CARE_PROVIDER_SITE_OTHER): Payer: Medicare Other

## 2020-03-26 ENCOUNTER — Ambulatory Visit (INDEPENDENT_AMBULATORY_CARE_PROVIDER_SITE_OTHER): Payer: Medicare Other | Admitting: Adult Health

## 2020-03-26 VITALS — BP 142/62 | HR 53 | Temp 97.4°F | Ht 67.0 in | Wt 224.0 lb

## 2020-03-26 DIAGNOSIS — R0609 Other forms of dyspnea: Secondary | ICD-10-CM

## 2020-03-26 DIAGNOSIS — I5032 Chronic diastolic (congestive) heart failure: Secondary | ICD-10-CM | POA: Diagnosis not present

## 2020-03-26 DIAGNOSIS — R0600 Dyspnea, unspecified: Secondary | ICD-10-CM

## 2020-03-26 DIAGNOSIS — J4540 Moderate persistent asthma, uncomplicated: Secondary | ICD-10-CM

## 2020-03-26 DIAGNOSIS — I480 Paroxysmal atrial fibrillation: Secondary | ICD-10-CM

## 2020-03-26 DIAGNOSIS — G4733 Obstructive sleep apnea (adult) (pediatric): Secondary | ICD-10-CM | POA: Diagnosis not present

## 2020-03-26 LAB — PULMONARY FUNCTION TEST
DL/VA % pred: 85 %
DL/VA: 3.49 ml/min/mmHg/L
DLCO cor % pred: 74 %
DLCO cor: 16.23 ml/min/mmHg
DLCO unc % pred: 74 %
DLCO unc: 16.23 ml/min/mmHg
FEF 25-75 Post: 1.07 L/sec
FEF 25-75 Pre: 0.65 L/sec
FEF2575-%Change-Post: 65 %
FEF2575-%Pred-Post: 49 %
FEF2575-%Pred-Pre: 29 %
FEV1-%Change-Post: 21 %
FEV1-%Pred-Post: 57 %
FEV1-%Pred-Pre: 47 %
FEV1-Post: 1.51 L
FEV1-Pre: 1.25 L
FEV1FVC-%Change-Post: 8 %
FEV1FVC-%Pred-Pre: 77 %
FEV6-%Change-Post: 11 %
FEV6-%Pred-Post: 71 %
FEV6-%Pred-Pre: 63 %
FEV6-Post: 2.35 L
FEV6-Pre: 2.11 L
FEV6FVC-%Change-Post: 0 %
FEV6FVC-%Pred-Post: 104 %
FEV6FVC-%Pred-Pre: 104 %
FVC-%Change-Post: 11 %
FVC-%Pred-Post: 68 %
FVC-%Pred-Pre: 61 %
FVC-Post: 2.36 L
FVC-Pre: 2.11 L
Post FEV1/FVC ratio: 64 %
Post FEV6/FVC ratio: 100 %
Pre FEV1/FVC ratio: 59 %
Pre FEV6/FVC Ratio: 100 %
RV % pred: 191 %
RV: 4.39 L
TLC % pred: 123 %
TLC: 6.82 L

## 2020-03-26 MED ORDER — BREO ELLIPTA 100-25 MCG/INH IN AEPB: 1.0000 | INHALATION_SPRAY | Freq: Every day | RESPIRATORY_TRACT | 5 refills | Status: DC

## 2020-03-26 NOTE — Patient Instructions (Signed)
Begin BREO 1 puff daily , rinse after use.  Albuterol inhaler As needed   Continue on CPAP At bedtime  .  Keep up the good work. Do not drive if sleepy Order for dreamwear nasal mask again  Work on healthy weight .  Saline nasal rinses as needed May use saline nasal gel at bedtime as needed Albuterol 1 puff every 6hr as needed wheezing  Zyrtec 10mg  At bedtime As needed   Follow up with Dr. Elsworth Soho or Breely Panik NP in 3 months and As needed

## 2020-03-26 NOTE — Patient Instructions (Signed)
Pulmonary function test performed today. 
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
## 2020-03-26 NOTE — Assessment & Plan Note (Signed)
Appears compensated without evidence of volume overload on exam continue follow-up with cardiology

## 2020-03-26 NOTE — Assessment & Plan Note (Signed)
Excellent control on nocturnal CPAP.  No changes.  Order to DME to supply the DreamWear nasal mask.  Plan Patient Instructions  Begin BREO 1 puff daily , rinse after use.  Albuterol inhaler As needed   Continue on CPAP At bedtime  .  Keep up the good work. Do not drive if sleepy Order for dreamwear nasal mask again  Work on healthy weight .  Saline nasal rinses as needed May use saline nasal gel at bedtime as needed Albuterol 1 puff every 6hr as needed wheezing  Zyrtec 10mg  At bedtime As needed   Follow up with Dr. Elsworth Soho or Graci Hulce NP in 3 months and As needed

## 2020-03-26 NOTE — Assessment & Plan Note (Signed)
Suspect was multifactorial as patient has severe airflow obstruction consistent with chronic obstructive asthma plus or minus COPD along with diastolic heart failure and A. fib.  Patient has had improvement after recent ablation. We will continue to monitor.

## 2020-03-26 NOTE — Assessment & Plan Note (Signed)
PFTs show severe chronic obstructive asthma.  Patient has significant reversibility consistent with underlying asthma -suspect patient may also have some underlying COPD as she has a strong smoking history.  For now we will add in Breo 1 puff daily.  Activity as tolerated. She does not qualify for the low-dose CT screening program.  Recent chest x-ray showed no acute process Albuterol as needed.  Asthma action plan discussed. Plan  Patient Instructions  Begin BREO 1 puff daily , rinse after use.  Albuterol inhaler As needed   Continue on CPAP At bedtime  .  Keep up the good work. Do not drive if sleepy Order for dreamwear nasal mask again  Work on healthy weight .  Saline nasal rinses as needed May use saline nasal gel at bedtime as needed Albuterol 1 puff every 6hr as needed wheezing  Zyrtec 10mg  At bedtime As needed   Follow up with Dr. Elsworth Soho or Sanvi Ehler NP in 3 months and As needed

## 2020-03-26 NOTE — Progress Notes (Signed)
Full PFT performed today. °

## 2020-03-29 ENCOUNTER — Telehealth (HOSPITAL_COMMUNITY): Payer: Self-pay | Admitting: *Deleted

## 2020-03-29 MED ORDER — CARVEDILOL 6.25 MG PO TABS: ORAL_TABLET | ORAL | 3 refills | Status: DC

## 2020-03-29 NOTE — Telephone Encounter (Signed)
Patient called in stating she continues to have SOB. She recently saw Pulmonary and was diagnosed with asthma she started [facility] with some improvement but wonders if coreg or flecainide are contributing.  Discussed with Roderic Palau NP will decrease coreg to 6.25mg  in AM and 12.5mg  in the PM see if improvement without palpations if so decrease to 6.25mg  BID. Pt will touch base next week with update.

## 2020-04-02 LAB — CUP PACEART REMOTE DEVICE CHECK
Date Time Interrogation Session: 20220327062632
Implantable Pulse Generator Implant Date: 20210301

## 2020-04-02 NOTE — Progress Notes (Signed)
Carelink Summary Report / Loop Recorder 

## 2020-04-06 ENCOUNTER — Telehealth: Payer: Self-pay | Admitting: Adult Health

## 2020-04-06 NOTE — Telephone Encounter (Signed)
Called patient but she did not answer. Left message for her to call back.  

## 2020-04-06 NOTE — Telephone Encounter (Signed)
Called patient back. She stated that [employer] did send the supplies but they sent the wrong supplies again. She stated that she was told by [employer] last week that if they did not send the right supplies, to let us know. I advised her I would contact [employer] to see what is going on. She verbalized understanding.   Will await community message response from [place], [place] and [place].

## 2020-04-12 NOTE — Telephone Encounter (Signed)
New, Jackquline [place], CMA; Darlina Guys; [place], [place]; Skeet Latch [place],   I have sent a message to the supply team. We will contact patient.   Thank you,   Brad New  ------------------------------------------- Will keep encounter open to follow back up.

## 2020-04-13 NOTE — Telephone Encounter (Signed)
Sorry to hear that you are not feeling well Can hold Breo for the next week to see if symptoms improve off Breo and then we can rechallenge if you would like. Hard to tell if this is cardiac or pulmonary.  It could be a combination of both.  Last visit pulmonary function testing showed you have significant asthma. May use albuterol if needed. If not feeling better will need an office visit to sort out.  Please contact office for sooner follow up if symptoms do not improve or worsen or seek emergency care

## 2020-04-13 NOTE — Telephone Encounter (Signed)
PCC's, please see email from pt and ry and find out if there is something else we need to do. The order was already put in. Thanks!  I am again asking for Dr Royal Piedra to again call [facility] and again give them the exact device number needed for my C-Pap nose device. They have again not been able to fill the order that was sent almost a month ago.  The person that called this time said my next order is due to be sent between April 13-18. If they can't get it straight in time I will be sent the wrong thing again.  Please understand I know you have been trying to get this straight and the problem lies with [place].  Thanks!    C-Pap nose device

## 2020-04-13 NOTE — Telephone Encounter (Signed)
Spoke to [place].  He sent message to resupply team on 4/4 about mask & they were to reach out to pt.  He checked his notes and doesn't look like they have tried to contact her yet.  He is going to send them another message today.  Will route to triage so they can let pt know thru [place].

## 2020-04-13 NOTE — Telephone Encounter (Signed)
Please advise on patient mychart message  I am noticing side effects that appear to be coming from [place]. They include constant pain in my bones especially where I have had injuries, vision problems, changes in heart beat that are different from normal and last night my palms were itching.  The first few days I was on Breo I noticed my oxygen levels were between 82 and 94 almost constantly. I talked with my cardiologist and my heart medicine was cut in half. My oxygen level has improved.  I have had some instances of shortness of breath and have not missed a dose of the Breo.  I have noticed that my shortness of breath seems to have changed when my heart medicines were increased (harder to breath) and then decreased (breathing easier).  After my heart ablation in January I felt GREAT.  Then I had an AFib incident and my medicine was doubled. That's when I started with shortness of breath again.  Could my heart medications be contributing to my breathing problems? Or, could my asthma stem from my heart medications?

## 2020-04-18 ENCOUNTER — Ambulatory Visit: Payer: Self-pay | Admitting: Physician Assistant

## 2020-04-26 ENCOUNTER — Ambulatory Visit (INDEPENDENT_AMBULATORY_CARE_PROVIDER_SITE_OTHER): Payer: Medicare Other

## 2020-04-26 DIAGNOSIS — R55 Syncope and collapse: Secondary | ICD-10-CM | POA: Diagnosis not present

## 2020-04-26 LAB — CUP PACEART REMOTE DEVICE CHECK
Date Time Interrogation Session: 20220421104236
Implantable Pulse Generator Implant Date: 20210301

## 2020-04-30 ENCOUNTER — Encounter: Payer: Self-pay | Admitting: Internal Medicine

## 2020-04-30 ENCOUNTER — Other Ambulatory Visit: Payer: Self-pay

## 2020-04-30 ENCOUNTER — Ambulatory Visit: Payer: Medicare Other | Admitting: Internal Medicine

## 2020-04-30 VITALS — BP 140/72 | HR 65 | Ht 67.0 in | Wt 225.4 lb

## 2020-04-30 DIAGNOSIS — I10 Essential (primary) hypertension: Secondary | ICD-10-CM | POA: Diagnosis not present

## 2020-04-30 DIAGNOSIS — I5032 Chronic diastolic (congestive) heart failure: Secondary | ICD-10-CM | POA: Diagnosis not present

## 2020-04-30 DIAGNOSIS — I480 Paroxysmal atrial fibrillation: Secondary | ICD-10-CM | POA: Diagnosis not present

## 2020-04-30 DIAGNOSIS — G4733 Obstructive sleep apnea (adult) (pediatric): Secondary | ICD-10-CM

## 2020-04-30 MED ORDER — CARVEDILOL 6.25 MG PO TABS: 3.1250 mg | ORAL_TABLET | Freq: Two times a day (BID) | ORAL | 0 refills | Status: DC
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
## 2020-04-30 NOTE — Patient Instructions (Addendum)
Medication Instructions: Stop Protonix Stop Flecainide  Reduce your Coreg to 3.125 mg two times a day, then in 6 weeks stop Your physician recommends that you continue on your current medications as directed. Please refer to the Current Medication list given to you today.  Labwork: None ordered.  Testing/Procedures: None ordered.  Follow-Up: Your physician wants you to follow-up in: 08/31/20 at 2:45 pm with [name], MD    Any Other Special Instructions Will Be Listed Below (If Applicable).  If you need a refill on your cardiac medications before your next appointment, please call your pharmacy.

## 2020-05-15 ENCOUNTER — Other Ambulatory Visit: Payer: Self-pay | Admitting: Internal Medicine

## 2020-05-15 NOTE — Progress Notes (Signed)
Carelink Summary Report / Loop Recorder 

## 2020-05-16 NOTE — Telephone Encounter (Signed)
Pt last saw Dr Rayann Heman 04/30/20, last labs 01/16/20 Creat 0.96, age 70, weight 102.2kg, CrCl 90.49, based on CrCl pt is on appropriate dosage of Xarelto 20mg  QD.  Will refill rx.

## 2020-05-28 ENCOUNTER — Ambulatory Visit (INDEPENDENT_AMBULATORY_CARE_PROVIDER_SITE_OTHER): Payer: Medicare Other

## 2020-05-28 DIAGNOSIS — I480 Paroxysmal atrial fibrillation: Secondary | ICD-10-CM | POA: Diagnosis not present

## 2020-05-31 LAB — CUP PACEART REMOTE DEVICE CHECK
Date Time Interrogation Session: 20220526074602
Implantable Pulse Generator Implant Date: 20210301

## 2020-06-18 NOTE — Progress Notes (Signed)
Carelink Summary Report / Loop Recorder 

## 2020-06-20 ENCOUNTER — Encounter: Payer: Medicare Other | Admitting: Internal Medicine

## 2020-06-21 ENCOUNTER — Other Ambulatory Visit: Payer: Self-pay

## 2020-06-21 DIAGNOSIS — F32A Depression, unspecified: Secondary | ICD-10-CM

## 2020-06-21 MED ORDER — DULOXETINE HCL 30 MG PO CPEP: ORAL_CAPSULE | ORAL | 1 refills | Status: DC

## 2020-06-28 ENCOUNTER — Ambulatory Visit (INDEPENDENT_AMBULATORY_CARE_PROVIDER_SITE_OTHER): Payer: Medicare Other

## 2020-06-28 ENCOUNTER — Ambulatory Visit: Payer: Medicare Other | Admitting: Adult Health

## 2020-06-28 ENCOUNTER — Encounter: Payer: Self-pay | Admitting: Adult Health

## 2020-06-28 ENCOUNTER — Other Ambulatory Visit: Payer: Self-pay

## 2020-06-28 VITALS — BP 142/78 | HR 69 | Ht 67.0 in | Wt 225.4 lb

## 2020-06-28 DIAGNOSIS — G4733 Obstructive sleep apnea (adult) (pediatric): Secondary | ICD-10-CM

## 2020-06-28 DIAGNOSIS — I5032 Chronic diastolic (congestive) heart failure: Secondary | ICD-10-CM | POA: Diagnosis not present

## 2020-06-28 DIAGNOSIS — I480 Paroxysmal atrial fibrillation: Secondary | ICD-10-CM | POA: Diagnosis not present

## 2020-06-28 DIAGNOSIS — J4540 Moderate persistent asthma, uncomplicated: Secondary | ICD-10-CM

## 2020-06-28 DIAGNOSIS — R0600 Dyspnea, unspecified: Secondary | ICD-10-CM | POA: Diagnosis not present

## 2020-06-28 DIAGNOSIS — R0609 Other forms of dyspnea: Secondary | ICD-10-CM

## 2020-06-28 NOTE — Assessment & Plan Note (Signed)
Continue on CPAP at bedtime.  Plan  Patient Instructions  Albuterol inhaler As needed   Continue on CPAP At bedtime  .  Keep up the good work. Do not drive if sleepy Work on healthy weight .  Saline nasal rinses as needed May use saline nasal gel at bedtime as needed Zyrtec 10mg  At bedtime As needed   Follow up with Dr. Elsworth Soho or Eriyonna Matsushita NP in 34-6 months and As needed
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
## 2020-06-28 NOTE — Patient Instructions (Addendum)
Albuterol inhaler As needed   Continue on CPAP At bedtime  .  Keep up the good work. Do not drive if sleepy Work on healthy weight .  Saline nasal rinses as needed May use saline nasal gel at bedtime as needed Zyrtec 10mg  At bedtime As needed   Follow up with Dr. Elsworth Soho or Brynlyn Dade NP in 34-6 months and As needed

## 2020-06-28 NOTE — Assessment & Plan Note (Addendum)
Intolerant to [employer].  Symptom burden has improved off of flecainide.  Repeat spirometry today shows no change in lung function.  Patient still has severe airflow obstruction.  We discussed this in detail as she may have underlying COPD with asthma versus chronic obstructive asthma.  Her initial PFTs did show significant reversibility.  We talked about the importance of a maintenance regimen with underlying asthma however she prefers not to be on any inhaler currently especially a ICS inhaler.  For now she is doing well with no significant albuterol use or activity tolerance limitations.  We will continue to monitor closely   Plan  Patient Instructions  Albuterol inhaler As needed   Continue on CPAP At bedtime  .  Keep up the good work. Do not drive if sleepy Work on healthy weight .  Saline nasal rinses as needed May use saline nasal gel at bedtime as needed Zyrtec 10mg  At bedtime As needed   Follow up with Dr. Elsworth Soho or Marynell Bies NP in 34-6 months and As needed

## 2020-06-28 NOTE — Assessment & Plan Note (Signed)
Dyspnea and activity tolerance has improved.  Continue on current regimen.

## 2020-06-28 NOTE — Assessment & Plan Note (Signed)
Appears euvolemic without evidence of fluid overload on exam Continue on current regimen

## 2020-07-02 LAB — CUP PACEART REMOTE DEVICE CHECK
Date Time Interrogation Session: 20220627081944
Implantable Pulse Generator Implant Date: 20210301

## 2020-07-10 ENCOUNTER — Ambulatory Visit (INDEPENDENT_AMBULATORY_CARE_PROVIDER_SITE_OTHER): Payer: Medicare Other | Admitting: Internal Medicine

## 2020-07-10 ENCOUNTER — Other Ambulatory Visit: Payer: Self-pay

## 2020-07-10 ENCOUNTER — Encounter: Payer: Self-pay | Admitting: Internal Medicine

## 2020-07-10 VITALS — BP 164/82 | HR 71 | Temp 98.5°F | Resp 16 | Ht 67.0 in | Wt 225.0 lb

## 2020-07-10 DIAGNOSIS — I480 Paroxysmal atrial fibrillation: Secondary | ICD-10-CM

## 2020-07-10 DIAGNOSIS — I5032 Chronic diastolic (congestive) heart failure: Secondary | ICD-10-CM | POA: Diagnosis not present

## 2020-07-10 DIAGNOSIS — E559 Vitamin D deficiency, unspecified: Secondary | ICD-10-CM | POA: Diagnosis not present

## 2020-07-10 DIAGNOSIS — M8949 Other hypertrophic osteoarthropathy, multiple sites: Secondary | ICD-10-CM | POA: Diagnosis not present

## 2020-07-10 DIAGNOSIS — Z0001 Encounter for general adult medical examination with abnormal findings: Secondary | ICD-10-CM

## 2020-07-10 DIAGNOSIS — E785 Hyperlipidemia, unspecified: Secondary | ICD-10-CM | POA: Diagnosis not present

## 2020-07-10 DIAGNOSIS — Z0000 Encounter for general adult medical examination without abnormal findings: Secondary | ICD-10-CM | POA: Diagnosis not present

## 2020-07-10 DIAGNOSIS — D509 Iron deficiency anemia, unspecified: Secondary | ICD-10-CM

## 2020-07-10 DIAGNOSIS — Z23 Encounter for immunization: Secondary | ICD-10-CM

## 2020-07-10 DIAGNOSIS — I10 Essential (primary) hypertension: Secondary | ICD-10-CM | POA: Diagnosis not present

## 2020-07-10 DIAGNOSIS — F411 Generalized anxiety disorder: Secondary | ICD-10-CM

## 2020-07-10 DIAGNOSIS — M159 Polyosteoarthritis, unspecified: Secondary | ICD-10-CM

## 2020-07-10 DIAGNOSIS — F32A Depression, unspecified: Secondary | ICD-10-CM

## 2020-07-10 DIAGNOSIS — M150 Primary generalized (osteo)arthritis: Secondary | ICD-10-CM

## 2020-07-10 DIAGNOSIS — F331 Major depressive disorder, recurrent, moderate: Secondary | ICD-10-CM

## 2020-07-10 DIAGNOSIS — Z1231 Encounter for screening mammogram for malignant neoplasm of breast: Secondary | ICD-10-CM

## 2020-07-10 MED ORDER — LORAZEPAM 0.5 MG PO TABS
0.5000 mg | ORAL_TABLET | Freq: Three times a day (TID) | ORAL | 2 refills | Status: DC | PRN
Start: 2020-07-10 — End: 2021-04-01

## 2020-07-10 MED ORDER — BOOSTRIX 5-2.5-18.5 LF-MCG/0.5 IM SUSP: 0.5000 mL | Freq: Once | INTRAMUSCULAR | 0 refills | Status: AC

## 2020-07-10 MED ORDER — DULOXETINE HCL 60 MG PO CPEP: 60.0000 mg | ORAL_CAPSULE | Freq: Every day | ORAL | 1 refills | Status: DC

## 2020-07-10 MED ORDER — TRAZODONE HCL 50 MG PO TABS: 50.0000 mg | ORAL_TABLET | Freq: Every evening | ORAL | 1 refills | Status: DC | PRN

## 2020-07-10 NOTE — Progress Notes (Signed)
Subjective:  Patient ID: Shannon [name], female    DOB: 07-08-1950  Age: 70 y.o. MRN: [id number]  CC: Annual Exam, Hypertension, Hyperlipidemia, and Depression  This visit occurred during the SARS-CoV-2 public health emergency.  Safety protocols were in place, including screening questions prior to the visit, additional usage of staff PPE, and extensive cleaning of exam room while observing appropriate contact time as indicated for disinfecting solutions.    HPI Shannon [name] presents for a CPX, f/up, and to establish.  She complains that her nerves are shot.  Her husband is ill with cardiac disease.  She complains of insomnia with difficulty falling asleep, frequent awakenings, and early morning awakenings.  She is taking duloxetine and lorazepam.  She has a history of iron deficiency anemia and would like to recheck her iron level.  She has a history of diastolic dysfunction and denies any recent episodes of diaphoresis, dizziness, lightheadedness, or edema.  Outpatient Medications Prior to Visit  Medication Sig Dispense Refill   acetaminophen (TYLENOL) 650 MG CR tablet Take 650-1,300 mg by mouth every 8 (eight) hours as needed for pain.      albuterol (VENTOLIN HFA) 108 (90 Base) MCG/ACT inhaler Inhale 1-2 puffs into the lungs every 6 (six) hours as needed for wheezing or shortness of breath. 8 g 2   amLODipine (NORVASC) 10 MG tablet TAKE 1 TABLET BY MOUTH EVERY DAY 90 tablet 1   carvedilol (COREG) 6.25 MG tablet Take 6.25 mg by mouth daily.     Cholecalciferol (VITAMIN D) 50 MCG (2000 UT) CAPS Take 2,000 Units by mouth daily.     loratadine (CLARITIN) 10 MG tablet Take 10 mg by mouth at bedtime.     triamcinolone (NASACORT) 55 MCG/ACT AERO nasal inhaler Place 2 sprays into the nose daily.     XARELTO 20 MG TABS tablet TAKE 1 TABLET BY MOUTH  DAILY WITH SUPPER 90 tablet 1   DULoxetine (CYMBALTA) 30 MG capsule TAKE 1 CAPSULE BY MOUTH EVERY DAY 90 capsule 1   LORazepam  (ATIVAN) 0.5 MG tablet Take 1 tablet (0.5 mg total) by mouth 2 (two) times daily as needed for anxiety. 30 tablet 1   carvedilol (COREG) 6.25 MG tablet Take 0.5 tablets (3.125 mg total) by mouth 2 (two) times daily with a meal. TAKE 1 TABLET BY MOUTH IN THE MORNING AND 2 TABLETS BY MOUTH IN THE EVENING 42 tablet 0   No facility-administered medications prior to visit.    ROS Review of Systems  Constitutional:  Negative for diaphoresis and fatigue.  HENT: Negative.  Negative for sinus pressure and sore throat.   Eyes: Negative.   Respiratory: Negative.  Negative for choking, shortness of breath and wheezing.   Cardiovascular:  Positive for palpitations. Negative for chest pain and leg swelling.       She had a brief episode of palpitations a week ago but none since then.  Gastrointestinal:  Negative for abdominal pain, constipation, diarrhea, nausea and vomiting.  Endocrine: Negative.   Genitourinary: Negative.  Negative for difficulty urinating.  Musculoskeletal: Negative.  Negative for arthralgias and myalgias.  Skin:  Negative for color change and pallor.  Allergic/Immunologic: Negative.   Neurological: Negative.   Hematological: Negative.  Negative for adenopathy. Does not bruise/bleed easily.  Psychiatric/Behavioral:  Positive for dysphoric mood and sleep disturbance. Negative for confusion, decreased concentration and suicidal ideas. The patient is not nervous/anxious.    Objective:  BP (!) 164/82 (BP Location: Left Arm, Patient Position: Sitting, Cuff  Size: Large)   Pulse 71   Temp 98.5 F (36.9 C) (Oral)   Resp 16   Ht 5\' 7"  (1.702 m)   Wt 225 lb (102.1 kg)   SpO2 96%   BMI 35.24 kg/m   BP Readings from Last 3 Encounters:  07/10/20 (!) 164/82  06/28/20 (!) 142/78  04/30/20 140/72    Wt Readings from Last 3 Encounters:  07/10/20 225 lb (102.1 kg)  06/28/20 225 lb 6.4 oz (102.2 kg)  04/30/20 225 lb 6.4 oz (102.2 kg)    Physical Exam Constitutional:       Appearance: Normal appearance.  HENT:     Nose: Nose normal.     Mouth/Throat:     Mouth: Mucous membranes are moist.  Eyes:     General: No scleral icterus.    Conjunctiva/sclera: Conjunctivae normal.  Cardiovascular:     Rate and Rhythm: Normal rate and regular rhythm.     Heart sounds: No murmur heard. Pulmonary:     Effort: Pulmonary effort is normal.     Breath sounds: No stridor. No wheezing, rhonchi or rales.  Abdominal:     General: Abdomen is protuberant. Bowel sounds are normal. There is no distension.     Palpations: Abdomen is soft. There is no hepatomegaly, splenomegaly or mass.     Tenderness: There is no abdominal tenderness. There is no guarding.  Musculoskeletal:        General: Normal range of motion.     Cervical back: Neck supple.     Right lower leg: No edema.     Left lower leg: No edema.  Lymphadenopathy:     Cervical: No cervical adenopathy.  Skin:    General: Skin is warm and dry.     Findings: No rash.  Neurological:     General: No focal deficit present.     Mental Status: She is alert and oriented to person, place, and time. Mental status is at baseline.  Psychiatric:        Mood and Affect: Mood normal.        Behavior: Behavior normal.    Lab Results  Component Value Date   WBC 8.9 07/10/2020   HGB 12.4 07/10/2020   HCT 36.6 07/10/2020   PLT 341.0 07/10/2020   GLUCOSE 90 07/10/2020   CHOL 180 07/10/2020   TRIG 210.0 (H) 07/10/2020   HDL 42.50 07/10/2020   LDLDIRECT 106.0 07/10/2020   LDLCALC 103 (H) 10/12/2018   ALT 19 07/10/2020   AST 22 07/10/2020   NA 137 07/10/2020   K 4.1 07/10/2020   CL 100 07/10/2020   CREATININE 0.77 07/10/2020   BUN 13 07/10/2020   CO2 30 07/10/2020   TSH 1.14 07/10/2020   INR 1.22 04/03/2016   HGBA1C 5.7 07/10/2020    No results found.  Assessment & Plan:   Shannon [name] was seen today for annual exam, hypertension, hyperlipidemia and depression.  Diagnoses and all orders for this visit:  Paroxysmal  atrial fibrillation ([place])- She has good rate and rhythm control.  Will continue the DOAC. -     Thyroid Panel With TSH; Future -     Thyroid Panel With TSH  Chronic diastolic CHF (congestive heart failure) ([place])- I recommended that she take empagliflozin for risk reduction. -     empagliflozin (JARDIANCE) 10 MG TABS tablet; Take 1 tablet (10 mg total) by mouth daily before breakfast.  Essential hypertension- Her blood pressure is adequately well controlled. -  CBC with Differential/Platelet; Future -     Basic metabolic panel; Future -     Thyroid Panel With TSH; Future -     Hepatic function panel; Future -     Hepatic function panel -     Thyroid Panel With TSH -     Basic metabolic panel -     CBC with Differential/Platelet  Dyslipidemia, goal LDL below 130- Her ASCVD risk score is elevated at 18%.  I recommended that she take a statin for CV risk reduction. -     Lipid panel; Future -     Thyroid Panel With TSH; Future -     Hepatic function panel; Future -     Hepatic function panel -     Thyroid Panel With TSH -     Lipid panel -     Pitavastatin Calcium 1 MG TABS; Take 1 tablet (1 mg total) by mouth daily.  Iron deficiency anemia, unspecified iron deficiency anemia type- Her H&H and iron levels are normal now. -     Iron; Future -     Ferritin; Future -     CBC with Differential/Platelet; Future -     CBC with Differential/Platelet -     Ferritin -     Iron  Vitamin D deficiency disease -     VITAMIN D 25 Hydroxy (Vit-D Deficiency, Fractures); Future -     VITAMIN D 25 Hydroxy (Vit-D Deficiency, Fractures)  Morbid obesity ([place])- Labs are negative for secondary causes or complications. -     Thyroid Panel With TSH; Future -     Hemoglobin A1c; Future -     Hemoglobin A1c -     Thyroid Panel With TSH  Primary osteoarthritis involving multiple joints-I recommended that she increase the dose of duloxetine. -     DULoxetine (CYMBALTA) 60 MG capsule; Take 1 capsule  (60 mg total) by mouth daily.  GAD (generalized anxiety disorder)- Will increase the dose of duloxetine.  Will add on trazodone.  Will continue the benzodiazepine as needed. -     DULoxetine (CYMBALTA) 60 MG capsule; Take 1 capsule (60 mg total) by mouth daily. -     traZODone (DESYREL) 50 MG tablet; Take 1 tablet (50 mg total) by mouth at bedtime as needed for sleep. -     LORazepam (ATIVAN) 0.5 MG tablet; Take 1 tablet (0.5 mg total) by mouth every 8 (eight) hours as needed for anxiety.  Moderate episode of recurrent major depressive disorder ([place])- See above. -     DULoxetine (CYMBALTA) 60 MG capsule; Take 1 capsule (60 mg total) by mouth daily. -     traZODone (DESYREL) 50 MG tablet; Take 1 tablet (50 mg total) by mouth at bedtime as needed for sleep.  Need for prophylactic vaccination with combined diphtheria-tetanus-pertussis (DTP) vaccine -     Tdap (BOOSTRIX) 5-2.5-18.5 LF-MCG/0.5 injection; Inject 0.5 mLs into the muscle once for 1 dose.  Anxiety and depression  Encounter for general adult medical examination with abnormal findings- Exam completed, labs reviewed, vaccines reviewed and updated, cancer screenings addressed, patient education was given.  Visit for screening mammogram -     MM DIGITAL SCREENING BILATERAL; Future  Other orders -     LDL cholesterol, direct  I have discontinued Shannon [name]'s DULoxetine. I have also changed her LORazepam. Additionally, I am having her start on DULoxetine, traZODone, Boostrix, Pitavastatin Calcium, and empagliflozin. Lastly, I am having her maintain her acetaminophen, Vitamin D,  triamcinolone, loratadine, amLODipine, albuterol, Xarelto, and carvedilol.  Meds ordered this encounter  Medications   DULoxetine (CYMBALTA) 60 MG capsule    Sig: Take 1 capsule (60 mg total) by mouth daily.    Dispense:  90 capsule    Refill:  1   traZODone (DESYREL) 50 MG tablet    Sig: Take 1 tablet (50 mg total) by mouth at bedtime as needed for  sleep.    Dispense:  90 tablet    Refill:  1   Tdap (BOOSTRIX) 5-2.5-18.5 LF-MCG/0.5 injection    Sig: Inject 0.5 mLs into the muscle once for 1 dose.    Dispense:  0.5 mL    Refill:  0   LORazepam (ATIVAN) 0.5 MG tablet    Sig: Take 1 tablet (0.5 mg total) by mouth every 8 (eight) hours as needed for anxiety.    Dispense:  60 tablet    Refill:  2   Pitavastatin Calcium 1 MG TABS    Sig: Take 1 tablet (1 mg total) by mouth daily.    Dispense:  90 tablet    Refill:  1   empagliflozin (JARDIANCE) 10 MG TABS tablet    Sig: Take 1 tablet (10 mg total) by mouth daily before breakfast.    Dispense:  90 tablet    Refill:  1   In addition to time spent on CPE, I spent 45 minutes in preparing to see the patient by review of recent labs, obtaining and reviewing separately obtained history, communicating with the patient, ordering medications and labs, and documenting clinical information in the EHR including the differential Dx, treatment, and any further evaluation and other management of 1. Paroxysmal atrial fibrillation (HCC) 2. Chronic diastolic CHF (congestive heart failure) ([place]) 3. Essential hypertension 4. Dyslipidemia, goal LDL below 130 5. Iron deficiency anemia, unspecified iron deficiency anemia type 6. Vitamin D deficiency disease 7. Morbid obesity ([place]) 8. Primary osteoarthritis involving multiple joints 9. GAD (generalized anxiety disorder) 10. Moderate episode of recurrent major depressive disorder ([place])       Follow-up: Return in about 3 months (around 10/10/2020).  Scarlette Calico, MD

## 2020-07-10 NOTE — Patient Instructions (Signed)
Health Maintenance, Female Adopting a healthy lifestyle and getting preventive care are important in promoting health and wellness. Ask your health care provider about: The right schedule for you to have regular tests and exams. Things you can do on your own to prevent diseases and keep yourself healthy. What should I know about diet, weight, and exercise? Eat a healthy diet  Eat a diet that includes plenty of vegetables, fruits, low-fat dairy products, and lean protein. Do not eat a lot of foods that are high in solid fats, added sugars, or sodium.  Maintain a healthy weight Body mass index (BMI) is used to identify weight problems. It estimates body fat based on height and weight. Your health care provider can help determineyour BMI and help you achieve or maintain a healthy weight. Get regular exercise Get regular exercise. This is one of the most important things you can do for your health. Most adults should: Exercise for at least 150 minutes each week. The exercise should increase your heart rate and make you sweat (moderate-intensity exercise). Do strengthening exercises at least twice a week. This is in addition to the moderate-intensity exercise. Spend less time sitting. Even light physical activity can be beneficial. Watch cholesterol and blood lipids Have your blood tested for lipids and cholesterol at 69 years of age, then havethis test every 5 years. Have your cholesterol levels checked more often if: Your lipid or cholesterol levels are high. You are older than 69 years of age. You are at high risk for heart disease. What should I know about cancer screening? Depending on your health history and family history, you may need to have cancer screening at various ages. This may include screening for: Breast cancer. Cervical cancer. Colorectal cancer. Skin cancer. Lung cancer. What should I know about heart disease, diabetes, and high blood pressure? Blood pressure and heart  disease High blood pressure causes heart disease and increases the risk of stroke. This is more likely to develop in people who have high blood pressure readings, are of African descent, or are overweight. Have your blood pressure checked: Every 3-5 years if you are 18-39 years of age. Every year if you are 40 years old or older. Diabetes Have regular diabetes screenings. This checks your fasting blood sugar level. Have the screening done: Once every three years after age 40 if you are at a normal weight and have a low risk for diabetes. More often and at a younger age if you are overweight or have a high risk for diabetes. What should I know about preventing infection? Hepatitis B If you have a higher risk for hepatitis B, you should be screened for this virus. Talk with your health care provider to find out if you are at risk forhepatitis B infection. Hepatitis C Testing is recommended for: Everyone born from 1945 through 1965. Anyone with known risk factors for hepatitis C. Sexually transmitted infections (STIs) Get screened for STIs, including gonorrhea and chlamydia, if: You are sexually active and are younger than 69 years of age. You are older than 69 years of age and your health care provider tells you that you are at risk for this type of infection. Your sexual activity has changed since you were last screened, and you are at increased risk for chlamydia or gonorrhea. Ask your health care provider if you are at risk. Ask your health care provider about whether you are at high risk for HIV. Your health care provider may recommend a prescription medicine to help   prevent HIV infection. If you choose to take medicine to prevent HIV, you should first get tested for HIV. You should then be tested every 3 months for as long as you are taking the medicine. Pregnancy If you are about to stop having your period (premenopausal) and you may become pregnant, seek counseling before you get  pregnant. Take 400 to 800 micrograms (mcg) of folic acid every day if you become pregnant. Ask for birth control (contraception) if you want to prevent pregnancy. Osteoporosis and menopause Osteoporosis is a disease in which the bones lose minerals and strength with aging. This can result in bone fractures. If you are 65 years old or older, or if you are at risk for osteoporosis and fractures, ask your health care provider if you should: Be screened for bone loss. Take a calcium or vitamin D supplement to lower your risk of fractures. Be given hormone replacement therapy (HRT) to treat symptoms of menopause. Follow these instructions at home: Lifestyle Do not use any products that contain nicotine or tobacco, such as cigarettes, e-cigarettes, and chewing tobacco. If you need help quitting, ask your health care provider. Do not use street drugs. Do not share needles. Ask your health care provider for help if you need support or information about quitting drugs. Alcohol use Do not drink alcohol if: Your health care provider tells you not to drink. You are pregnant, may be pregnant, or are planning to become pregnant. If you drink alcohol: Limit how much you use to 0-1 drink a day. Limit intake if you are breastfeeding. Be aware of how much alcohol is in your drink. In the [place] one drink equals one 12 oz bottle of beer (355 mL), one 5 oz glass of wine (148 mL), or one 1 oz glass of hard liquor (44 mL). General instructions Schedule regular health, dental, and eye exams. Stay current with your vaccines. Tell your health care provider if: You often feel depressed. You have ever been abused or do not feel safe at home. Summary Adopting a healthy lifestyle and getting preventive care are important in promoting health and wellness. Follow your health care provider's instructions about healthy diet, exercising, and getting tested or screened for diseases. Follow your health care provider's  instructions on monitoring your cholesterol and blood pressure. This information is not intended to replace advice given to you by your health care provider. Make sure you discuss any questions you have with your healthcare provider. Document Revised: 12/16/2017 Document Reviewed: 12/16/2017 Elsevier Patient Education  2022 Elsevier Inc.  

## 2020-07-11 LAB — HEPATIC FUNCTION PANEL
ALT: 19 U/L (ref 0–35)
AST: 22 U/L (ref 0–37)
Albumin: 4.1 g/dL (ref 3.5–5.2)
Alkaline Phosphatase: 90 U/L (ref 39–117)
Bilirubin, Direct: 0.1 mg/dL (ref 0.0–0.3)
Total Bilirubin: 0.4 mg/dL (ref 0.2–1.2)
Total Protein: 7.5 g/dL (ref 6.0–8.3)

## 2020-07-11 LAB — CBC WITH DIFFERENTIAL/PLATELET
Basophils Absolute: 0.1 10*3/uL (ref 0.0–0.1)
Basophils Relative: 0.7 % (ref 0.0–3.0)
Eosinophils Absolute: 0.4 10*3/uL (ref 0.0–0.7)
Eosinophils Relative: 4.4 % (ref 0.0–5.0)
HCT: 36.6 % (ref 36.0–46.0)
Hemoglobin: 12.4 g/dL (ref 12.0–15.0)
Lymphocytes Relative: 21.7 % (ref 12.0–46.0)
Lymphs Abs: 1.9 10*3/uL (ref 0.7–4.0)
MCHC: 33.8 g/dL (ref 30.0–36.0)
MCV: 87.6 fl (ref 78.0–100.0)
Monocytes Absolute: 0.9 10*3/uL (ref 0.1–1.0)
Monocytes Relative: 9.7 % (ref 3.0–12.0)
Neutro Abs: 5.7 10*3/uL (ref 1.4–7.7)
Neutrophils Relative %: 63.5 % (ref 43.0–77.0)
Platelets: 341 10*3/uL (ref 150.0–400.0)
RBC: 4.18 Mil/uL (ref 3.87–5.11)
RDW: 14.4 % (ref 11.5–15.5)
WBC: 8.9 10*3/uL (ref 4.0–10.5)

## 2020-07-11 LAB — BASIC METABOLIC PANEL
BUN: 13 mg/dL (ref 6–23)
CO2: 30 mEq/L (ref 19–32)
Calcium: 9.6 mg/dL (ref 8.4–10.5)
Chloride: 100 mEq/L (ref 96–112)
Creatinine, Ser: 0.77 mg/dL (ref 0.40–1.20)
GFR: 78.89 mL/min (ref >60.00)
Glucose, Bld: 90 mg/dL (ref 70–99)
Potassium: 4.1 mEq/L (ref 3.5–5.1)
Sodium: 137 mEq/L (ref 135–145)

## 2020-07-11 LAB — THYROID PANEL WITH TSH
Free Thyroxine Index: 2.2 (ref 1.4–3.8)
T3 Uptake: 29 % (ref 22–35)
T4, Total: 7.7 ug/dL (ref 5.1–11.9)
TSH: 1.14 mIU/L (ref 0.40–4.50)

## 2020-07-11 LAB — LIPID PANEL
Cholesterol: 180 mg/dL (ref 0–200)
HDL: 42.5 mg/dL (ref >39.00)
NonHDL: 137.43
Total CHOL/HDL Ratio: 4
Triglycerides: 210 mg/dL — ABNORMAL HIGH (ref 0.0–149.0)
VLDL: 42 mg/dL — ABNORMAL HIGH (ref 0.0–40.0)

## 2020-07-11 LAB — FERRITIN: Ferritin: 108.2 ng/mL (ref 10.0–291.0)

## 2020-07-11 LAB — HEMOGLOBIN A1C: Hgb A1c MFr Bld: 5.7 % (ref 4.6–6.5)

## 2020-07-11 LAB — IRON: Iron: 69 ug/dL (ref 42–145)

## 2020-07-11 LAB — VITAMIN D 25 HYDROXY (VIT D DEFICIENCY, FRACTURES): VITD: 40 ng/mL (ref 30.00–100.00)

## 2020-07-11 LAB — LDL CHOLESTEROL, DIRECT: Direct LDL: 106 mg/dL

## 2020-07-12 ENCOUNTER — Encounter: Payer: Self-pay | Admitting: Internal Medicine

## 2020-07-12 ENCOUNTER — Other Ambulatory Visit: Payer: Self-pay | Admitting: Internal Medicine

## 2020-07-12 MED ORDER — PITAVASTATIN CALCIUM 1 MG PO TABS: 1.0000 mg | ORAL_TABLET | Freq: Every day | ORAL | 1 refills | Status: DC

## 2020-07-12 MED ORDER — EMPAGLIFLOZIN 10 MG PO TABS: 10.0000 mg | ORAL_TABLET | Freq: Every day | ORAL | 1 refills | Status: DC

## 2020-07-15 DIAGNOSIS — Z0000 Encounter for general adult medical examination without abnormal findings: Secondary | ICD-10-CM | POA: Insufficient documentation

## 2020-07-15 DIAGNOSIS — Z0001 Encounter for general adult medical examination with abnormal findings: Secondary | ICD-10-CM | POA: Insufficient documentation

## 2020-07-18 NOTE — Progress Notes (Signed)
Carelink Summary Report / Loop Recorder 

## 2020-07-30 ENCOUNTER — Ambulatory Visit (INDEPENDENT_AMBULATORY_CARE_PROVIDER_SITE_OTHER): Payer: Medicare Other

## 2020-07-30 DIAGNOSIS — I480 Paroxysmal atrial fibrillation: Secondary | ICD-10-CM | POA: Diagnosis not present

## 2020-08-04 LAB — CUP PACEART REMOTE DEVICE CHECK
Date Time Interrogation Session: 20220729200219
Implantable Pulse Generator Implant Date: 20210301

## 2020-08-05 DIAGNOSIS — G4733 Obstructive sleep apnea (adult) (pediatric): Secondary | ICD-10-CM | POA: Diagnosis not present

## 2020-08-05 DIAGNOSIS — I10 Essential (primary) hypertension: Secondary | ICD-10-CM | POA: Diagnosis not present

## 2020-08-21 NOTE — Progress Notes (Signed)
Carelink Summary Report / Loop Recorder 

## 2020-08-30 ENCOUNTER — Ambulatory Visit (INDEPENDENT_AMBULATORY_CARE_PROVIDER_SITE_OTHER): Payer: Medicare Other

## 2020-08-30 DIAGNOSIS — I480 Paroxysmal atrial fibrillation: Secondary | ICD-10-CM

## 2020-08-31 ENCOUNTER — Ambulatory Visit: Payer: Medicare Other | Admitting: Internal Medicine

## 2020-08-31 ENCOUNTER — Other Ambulatory Visit: Payer: Self-pay

## 2020-08-31 VITALS — BP 132/68 | HR 69 | Ht 67.0 in | Wt 224.6 lb

## 2020-08-31 DIAGNOSIS — I480 Paroxysmal atrial fibrillation: Secondary | ICD-10-CM

## 2020-08-31 DIAGNOSIS — I5032 Chronic diastolic (congestive) heart failure: Secondary | ICD-10-CM | POA: Diagnosis not present

## 2020-08-31 DIAGNOSIS — I10 Essential (primary) hypertension: Secondary | ICD-10-CM

## 2020-08-31 DIAGNOSIS — G4733 Obstructive sleep apnea (adult) (pediatric): Secondary | ICD-10-CM
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
## 2020-08-31 NOTE — Patient Instructions (Addendum)
Medication Instructions:  Your physician recommends that you continue on your current medications as directed. Please refer to the Current Medication list given to you today.  Labwork: None ordered.  Testing/Procedures: None ordered.  Follow-Up: Your physician wants you to follow-up in: 02/27/21 at 3:15 pm [name], PA-C     Any Other Special Instructions Will Be Listed Below (If Applicable).  If you need a refill on your cardiac medications before your next appointment, please call your pharmacy.

## 2020-09-05 LAB — CUP PACEART REMOTE DEVICE CHECK
Date Time Interrogation Session: 20220831101845
Implantable Pulse Generator Implant Date: 20210301

## 2020-09-07 ENCOUNTER — Other Ambulatory Visit: Payer: Self-pay | Admitting: Internal Medicine

## 2020-09-09 ENCOUNTER — Telehealth: Payer: Self-pay

## 2020-09-09 NOTE — Telephone Encounter (Signed)
Called and left message for patient to call PCP's office to schedule her Medicare Wellness Visit.

## 2020-09-13 NOTE — Progress Notes (Signed)
Carelink Summary Report / Loop Recorder 

## 2020-10-01 ENCOUNTER — Ambulatory Visit: Payer: Medicare Other

## 2020-10-04 ENCOUNTER — Other Ambulatory Visit: Payer: Self-pay

## 2020-10-04 ENCOUNTER — Encounter: Payer: Self-pay | Admitting: Registered Nurse

## 2020-10-04 ENCOUNTER — Ambulatory Visit (INDEPENDENT_AMBULATORY_CARE_PROVIDER_SITE_OTHER): Payer: Medicare Other | Admitting: Registered Nurse

## 2020-10-04 VITALS — BP 155/66 | HR 65 | Temp 98.3°F | Resp 18 | Ht 67.0 in | Wt 225.4 lb

## 2020-10-04 DIAGNOSIS — E559 Vitamin D deficiency, unspecified: Secondary | ICD-10-CM

## 2020-10-04 DIAGNOSIS — I10 Essential (primary) hypertension: Secondary | ICD-10-CM | POA: Diagnosis not present

## 2020-10-04 DIAGNOSIS — F32A Depression, unspecified: Secondary | ICD-10-CM | POA: Diagnosis not present

## 2020-10-04 DIAGNOSIS — Z0000 Encounter for general adult medical examination without abnormal findings: Secondary | ICD-10-CM

## 2020-10-04 DIAGNOSIS — F419 Anxiety disorder, unspecified: Secondary | ICD-10-CM

## 2020-10-04 LAB — CUP PACEART REMOTE DEVICE CHECK
Date Time Interrogation Session: 20220929000322
Implantable Pulse Generator Implant Date: 20210301

## 2020-10-04 MED ORDER — DULOXETINE HCL 30 MG PO CPEP: 30.0000 mg | ORAL_CAPSULE | Freq: Every day | ORAL | 3 refills | Status: DC

## 2020-10-04 MED ORDER — AMLODIPINE BESYLATE 5 MG PO TABS: 5.0000 mg | ORAL_TABLET | Freq: Every day | ORAL | 1 refills | Status: DC
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
## 2020-10-04 NOTE — Patient Instructions (Addendum)
[name] to meet you!  Labs in 3 months, a visit a few days after that. Early January ok.  I'll be following along with mammo, colonoscopy, and other results.  Please call if you need anything!  Thanks,  Rich     If you have lab work done today you will be contacted with your lab results within the next 2 weeks.  If you have not heard from [place] then please contact us. The fastest way to get your results is to register for My Chart.   IF you received an x-ray today, you will receive an invoice from [facility]. Please contact [facility] at [phone] with questions or concerns regarding your invoice.   IF you received labwork today, you will receive an invoice from [facility]. Please contact [facility] at [phone] with questions or concerns regarding your invoice.   Our billing staff will not be able to assist you with questions regarding bills from these companies.  You will be contacted with the lab results as soon as they are available. The fastest way to get your results is to activate your My Chart account. Instructions are located on the last page of this paperwork. If you have not heard from [place] regarding the results in 2 weeks, please contact this office.

## 2020-10-08 ENCOUNTER — Ambulatory Visit (INDEPENDENT_AMBULATORY_CARE_PROVIDER_SITE_OTHER): Payer: Medicare Other

## 2020-10-08 DIAGNOSIS — I480 Paroxysmal atrial fibrillation: Secondary | ICD-10-CM | POA: Diagnosis not present

## 2020-10-08 LAB — CUP PACEART REMOTE DEVICE CHECK
Date Time Interrogation Session: 20221003120508
Implantable Pulse Generator Implant Date: 20210301

## 2020-10-11 ENCOUNTER — Other Ambulatory Visit: Payer: Self-pay

## 2020-10-11 ENCOUNTER — Ambulatory Visit
Admission: RE | Admit: 2020-10-11 | Discharge: 2020-10-11 | Disposition: A | Discharge status: Home / Self Care | Payer: Medicare Other | Source: Ambulatory Visit | Attending: Internal Medicine | Admitting: Internal Medicine

## 2020-10-11 DIAGNOSIS — Z1231 Encounter for screening mammogram for malignant neoplasm of breast: Secondary | ICD-10-CM

## 2020-10-11 IMAGING — MG MM DIGITAL SCREENING BILAT W/ TOMO AND CAD
8 series · 8 of 24 positions shown · non-contrast
Comparison: Previous exam(s).

CLINICAL DATA: Screening.

EXAM:
DIGITAL SCREENING BILATERAL MAMMOGRAM WITH TOMOSYNTHESIS AND CAD
TECHNIQUE: Bilateral screening digital craniocaudal and mediolateral oblique
mammograms were obtained. Bilateral screening digital breast
tomosynthesis was performed. The images were evaluated with
computer-aided detection.

[L MLO synth-2D]
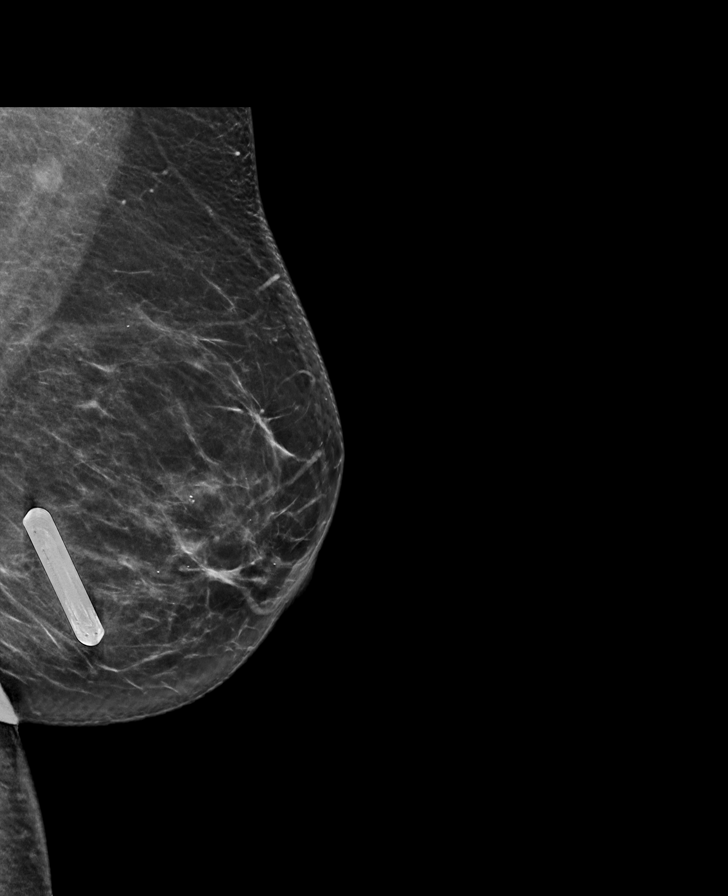

[R MLO synth-2D]
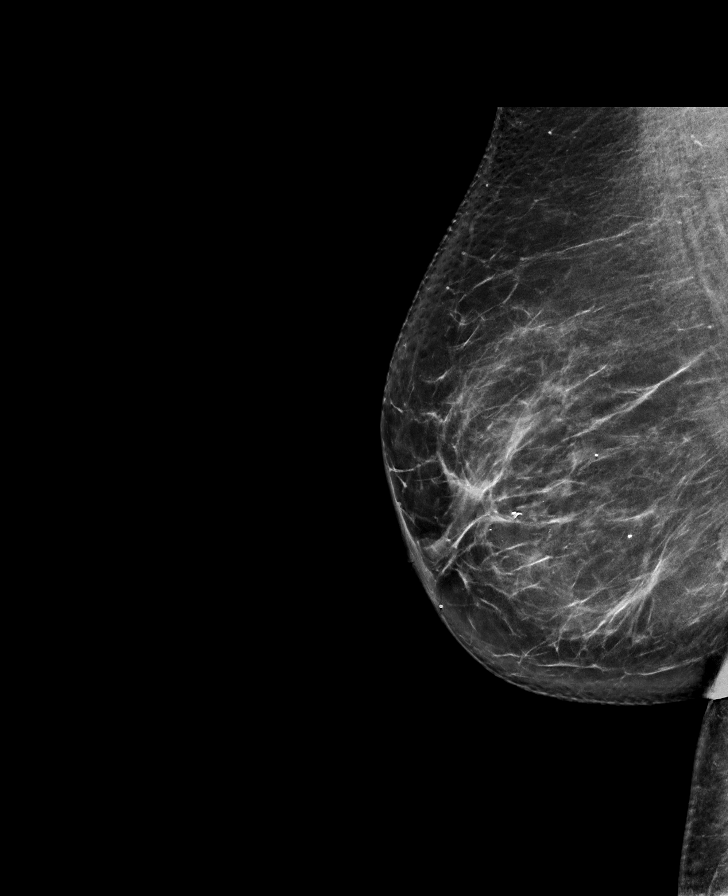

[L CC synth-2D]
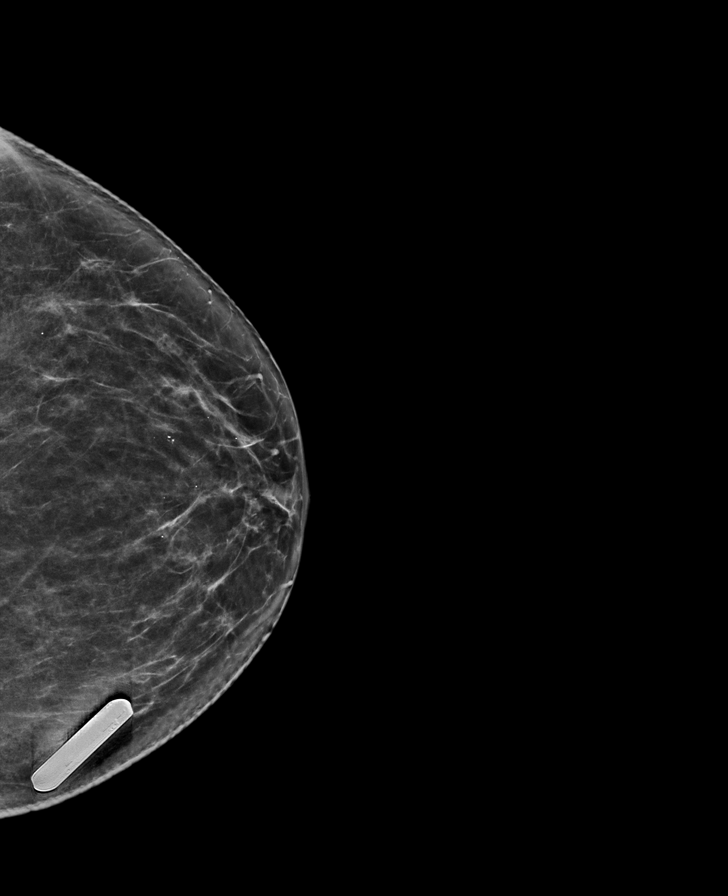

[R CC synth-2D]
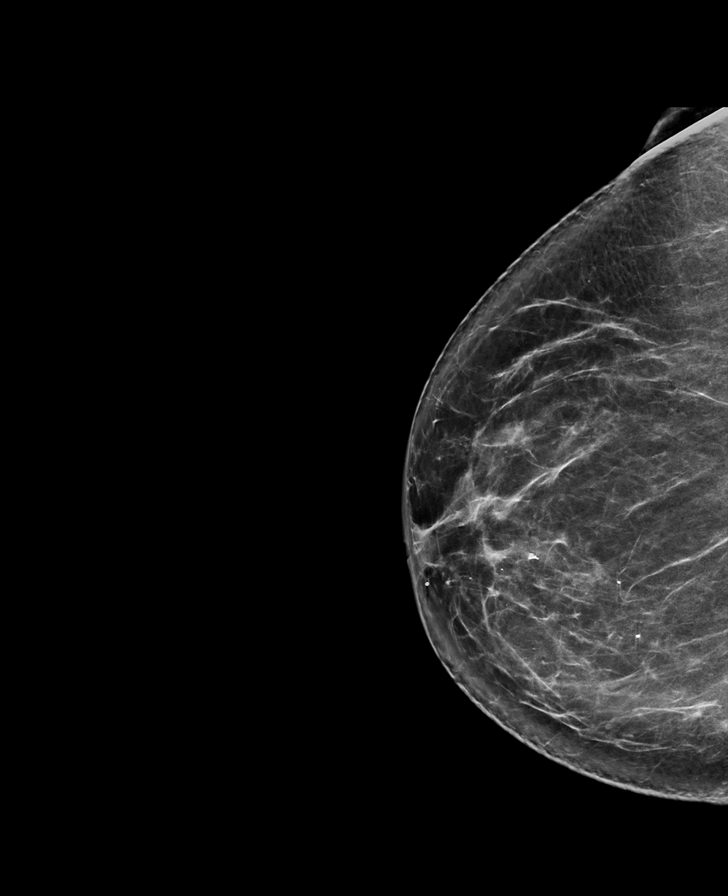

[R MLO tomo · tomo slice 43/86.0]
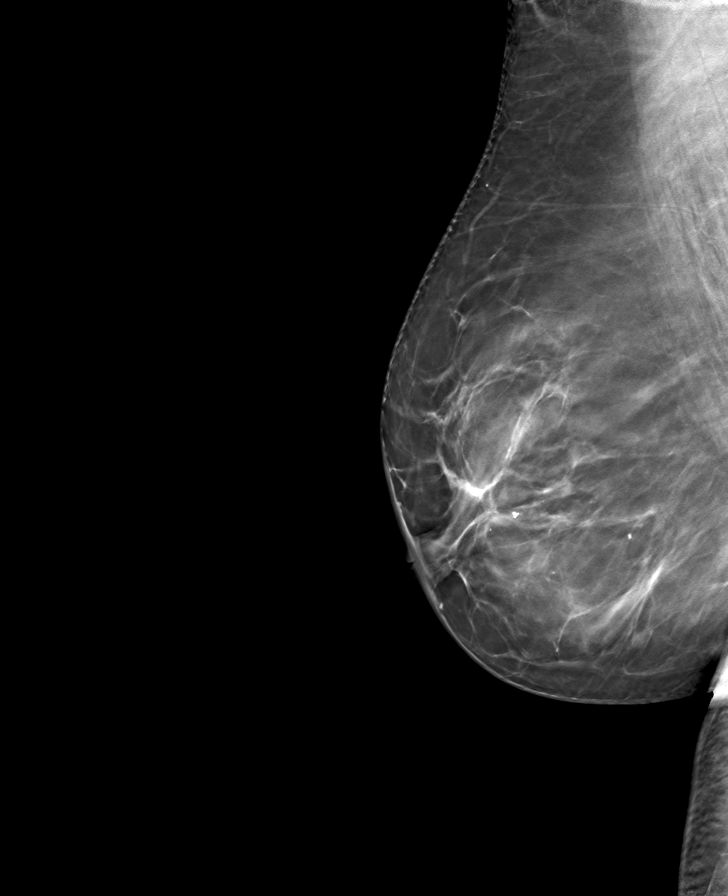

[R CC tomo · tomo slice 42/83.0]
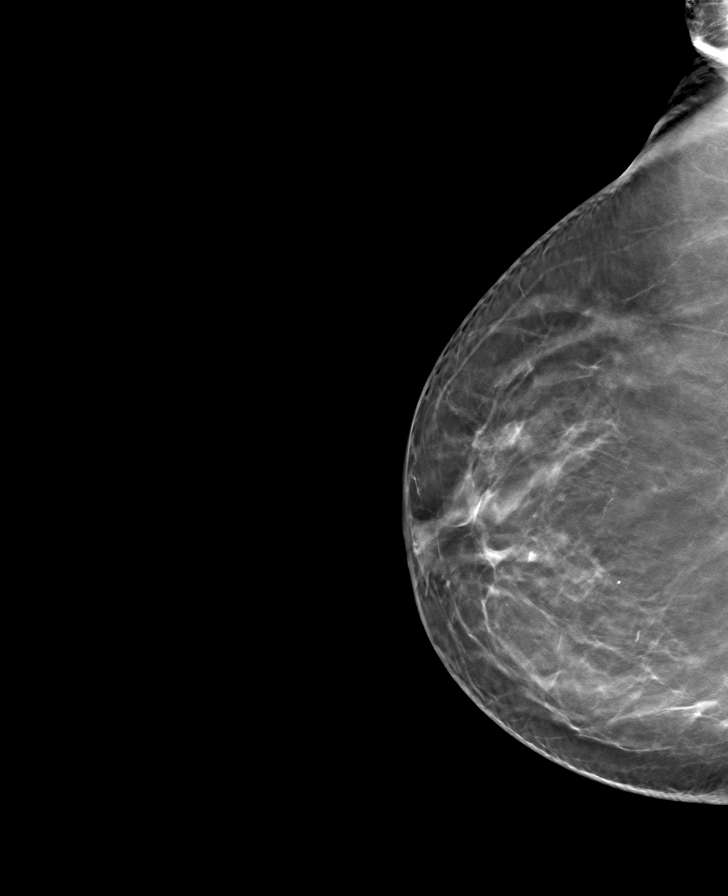

[L MLO tomo · tomo slice 39/78.0]
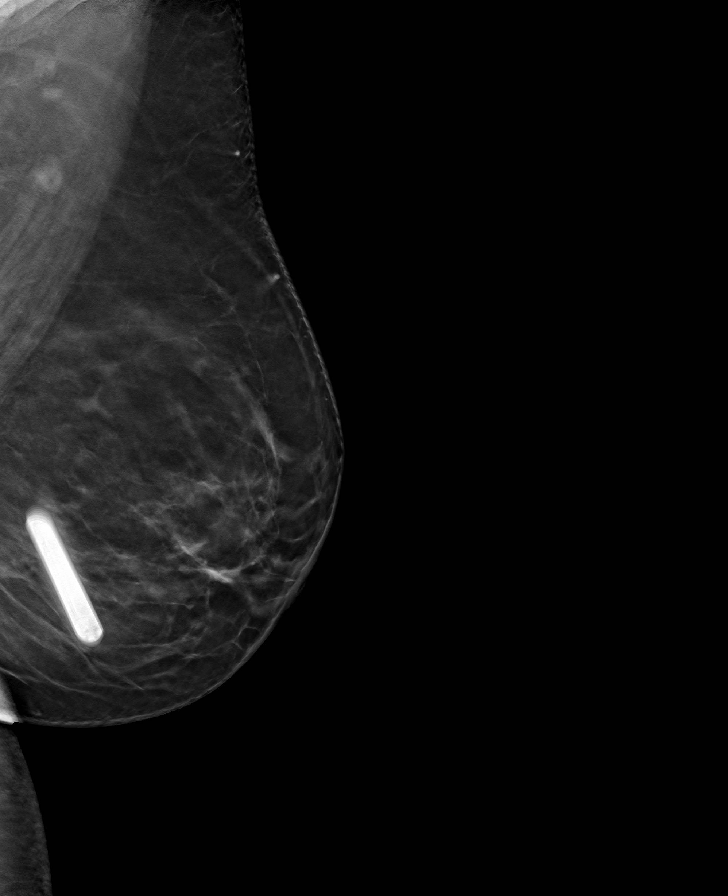

[L CC tomo · tomo slice 37/72.0]
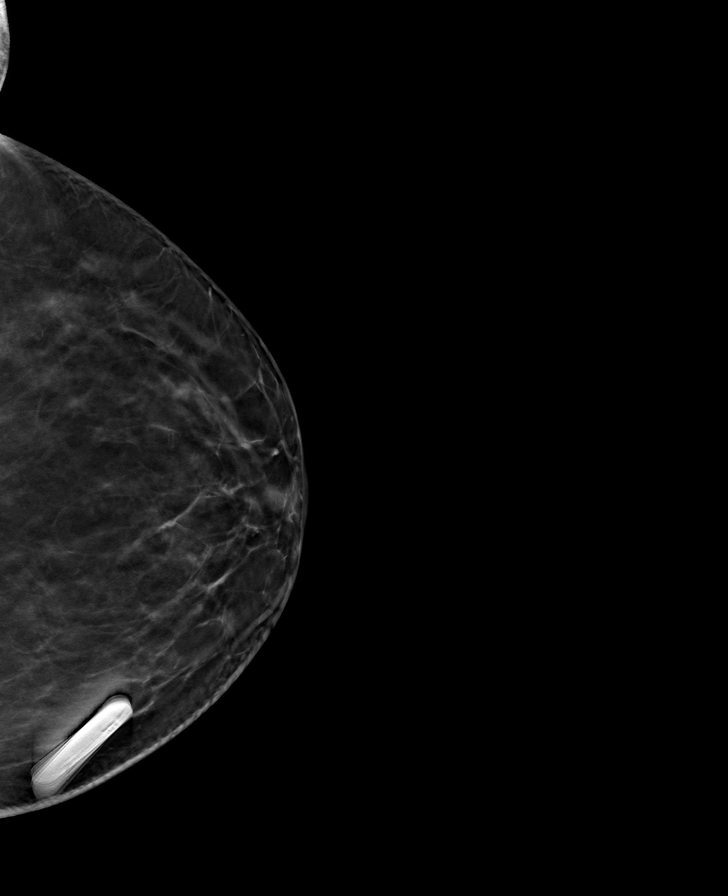

[8 of 24 positions shown; findings below may reference images not displayed]

ACR Breast Density Category b: There are scattered areas of
fibroglandular density.
FINDINGS: There are no findings suspicious for malignancy.
IMPRESSION: No mammographic evidence of malignancy. A result letter of this
screening mammogram will be mailed directly to the patient.

RECOMMENDATION:
Screening mammogram in one year. (Code:[BY])

BI-RADS CATEGORY  1: Negative.

## 2020-10-15 NOTE — Progress Notes (Signed)
Carelink Summary Report / Loop Recorder 

## 2020-10-18 ENCOUNTER — Other Ambulatory Visit: Payer: Self-pay | Admitting: Internal Medicine

## 2020-10-19 NOTE — Telephone Encounter (Signed)
Xarelto 20mg  refill request received. Pt is 70 years old, weight-102.2kg, Crea-0.77 on 07/10/2020, last seen by Dr. Rayann Heman on 08/31/2020, Diagnosis-Afib, CrCl-111.68ml/min; Dose is appropriate based on dosing criteria. Will send in refill to requested pharmacy.

## 2020-11-03 DIAGNOSIS — I10 Essential (primary) hypertension: Secondary | ICD-10-CM | POA: Diagnosis not present

## 2020-11-03 DIAGNOSIS — G4733 Obstructive sleep apnea (adult) (pediatric): Secondary | ICD-10-CM | POA: Diagnosis not present

## 2020-11-12 ENCOUNTER — Ambulatory Visit (INDEPENDENT_AMBULATORY_CARE_PROVIDER_SITE_OTHER): Payer: Medicare Other

## 2020-11-12 DIAGNOSIS — I480 Paroxysmal atrial fibrillation: Secondary | ICD-10-CM

## 2020-11-13 LAB — CUP PACEART REMOTE DEVICE CHECK
Date Time Interrogation Session: 20221107055832
Implantable Pulse Generator Implant Date: 20210301

## 2020-11-14 NOTE — Progress Notes (Signed)
Carelink Summary Report / Loop Recorder 

## 2020-12-13 LAB — CUP PACEART REMOTE DEVICE CHECK
Date Time Interrogation Session: 20221128004152
Implantable Pulse Generator Implant Date: 20210301

## 2020-12-17 ENCOUNTER — Ambulatory Visit (INDEPENDENT_AMBULATORY_CARE_PROVIDER_SITE_OTHER): Payer: Medicare Other

## 2020-12-17 DIAGNOSIS — I480 Paroxysmal atrial fibrillation: Secondary | ICD-10-CM | POA: Diagnosis not present

## 2020-12-25 NOTE — Progress Notes (Signed)
Carelink Summary Report / Loop Recorder 

## 2020-12-28 ENCOUNTER — Other Ambulatory Visit: Payer: Self-pay | Admitting: Internal Medicine

## 2020-12-28 DIAGNOSIS — I10 Essential (primary) hypertension: Secondary | ICD-10-CM

## 2020-12-28 DIAGNOSIS — I5032 Chronic diastolic (congestive) heart failure: Secondary | ICD-10-CM

## 2021-01-03 ENCOUNTER — Other Ambulatory Visit (INDEPENDENT_AMBULATORY_CARE_PROVIDER_SITE_OTHER): Payer: Medicare Other

## 2021-01-03 ENCOUNTER — Other Ambulatory Visit: Payer: Self-pay | Admitting: Internal Medicine

## 2021-01-03 DIAGNOSIS — I10 Essential (primary) hypertension: Secondary | ICD-10-CM | POA: Diagnosis not present

## 2021-01-03 DIAGNOSIS — E559 Vitamin D deficiency, unspecified: Secondary | ICD-10-CM

## 2021-01-03 LAB — CBC WITH DIFFERENTIAL/PLATELET
Basophils Absolute: 0 10*3/uL (ref 0.0–0.1)
Basophils Relative: 0.7 % (ref 0.0–3.0)
Eosinophils Absolute: 0.3 10*3/uL (ref 0.0–0.7)
Eosinophils Relative: 4.3 % (ref 0.0–5.0)
HCT: 38.9 % (ref 36.0–46.0)
Hemoglobin: 12.8 g/dL (ref 12.0–15.0)
Lymphocytes Relative: 21.9 % (ref 12.0–46.0)
Lymphs Abs: 1.5 10*3/uL (ref 0.7–4.0)
MCHC: 32.8 g/dL (ref 30.0–36.0)
MCV: 88.2 fl (ref 78.0–100.0)
Monocytes Absolute: 0.6 10*3/uL (ref 0.1–1.0)
Monocytes Relative: 8.5 % (ref 3.0–12.0)
Neutro Abs: 4.5 10*3/uL (ref 1.4–7.7)
Neutrophils Relative %: 64.6 % (ref 43.0–77.0)
Platelets: 327 10*3/uL (ref 150.0–400.0)
RBC: 4.41 Mil/uL (ref 3.87–5.11)
RDW: 14.3 % (ref 11.5–15.5)
WBC: 6.9 10*3/uL (ref 4.0–10.5)

## 2021-01-03 LAB — COMPREHENSIVE METABOLIC PANEL
ALT: 17 U/L (ref 0–35)
AST: 18 U/L (ref 0–37)
Albumin: 4.1 g/dL (ref 3.5–5.2)
Alkaline Phosphatase: 91 U/L (ref 39–117)
BUN: 11 mg/dL (ref 6–23)
CO2: 30 mEq/L (ref 19–32)
Calcium: 9.2 mg/dL (ref 8.4–10.5)
Chloride: 100 mEq/L (ref 96–112)
Creatinine, Ser: 0.87 mg/dL (ref 0.40–1.20)
GFR: 67.91 mL/min (ref >60.00)
Glucose, Bld: 103 mg/dL — ABNORMAL HIGH (ref 70–99)
Potassium: 3.8 mEq/L (ref 3.5–5.1)
Sodium: 139 mEq/L (ref 135–145)
Total Bilirubin: 0.5 mg/dL (ref 0.2–1.2)
Total Protein: 7.1 g/dL (ref 6.0–8.3)

## 2021-01-03 LAB — HEMOGLOBIN A1C: Hgb A1c MFr Bld: 5.7 % (ref 4.6–6.5)

## 2021-01-03 LAB — LIPID PANEL
Cholesterol: 194 mg/dL (ref 0–200)
HDL: 53.4 mg/dL (ref >39.00)
LDL Cholesterol: 111 mg/dL — ABNORMAL HIGH (ref 0–99)
NonHDL: 140.77
Total CHOL/HDL Ratio: 4
Triglycerides: 147 mg/dL (ref 0.0–149.0)
VLDL: 29.4 mg/dL (ref 0.0–40.0)

## 2021-01-03 LAB — VITAMIN D 25 HYDROXY (VIT D DEFICIENCY, FRACTURES): VITD: 38.87 ng/mL (ref 30.00–100.00)

## 2021-01-03 LAB — TSH: TSH: 1.52 u[IU]/mL (ref 0.35–5.50)

## 2021-01-10 ENCOUNTER — Ambulatory Visit: Payer: Medicare Other | Admitting: Registered Nurse

## 2021-01-11 ENCOUNTER — Other Ambulatory Visit: Payer: Self-pay | Admitting: Registered Nurse

## 2021-01-11 ENCOUNTER — Ambulatory Visit: Payer: Medicare Other | Admitting: Registered Nurse

## 2021-01-11 DIAGNOSIS — Z1211 Encounter for screening for malignant neoplasm of colon: Secondary | ICD-10-CM

## 2021-01-11 DIAGNOSIS — E2839 Other primary ovarian failure: Secondary | ICD-10-CM

## 2021-01-11 DIAGNOSIS — Z23 Encounter for immunization: Secondary | ICD-10-CM

## 2021-01-11 MED ORDER — SHINGRIX 50 MCG/0.5ML IM SUSR: 0.5000 mL | Freq: Once | INTRAMUSCULAR | 1 refills | Status: AC

## 2021-01-21 ENCOUNTER — Ambulatory Visit (INDEPENDENT_AMBULATORY_CARE_PROVIDER_SITE_OTHER): Payer: Medicare Other

## 2021-01-21 DIAGNOSIS — R55 Syncope and collapse: Secondary | ICD-10-CM

## 2021-01-23 LAB — CUP PACEART REMOTE DEVICE CHECK
Date Time Interrogation Session: 20230118054811
Implantable Pulse Generator Implant Date: 20210301

## 2021-01-30 DIAGNOSIS — H4711 Papilledema associated with increased intracranial pressure: Secondary | ICD-10-CM | POA: Diagnosis not present

## 2021-01-30 NOTE — Progress Notes (Signed)
Carelink Summary Report / Loop Recorder 

## 2021-01-31 ENCOUNTER — Encounter: Payer: Self-pay | Admitting: Nurse Practitioner

## 2021-01-31 ENCOUNTER — Ambulatory Visit: Payer: Medicare Other | Admitting: Nurse Practitioner

## 2021-01-31 VITALS — BP 124/80 | HR 67 | Ht 67.0 in | Wt 223.0 lb

## 2021-01-31 DIAGNOSIS — Z860100 Personal history of colon polyps, unspecified: Secondary | ICD-10-CM

## 2021-01-31 DIAGNOSIS — Z7901 Long term (current) use of anticoagulants: Secondary | ICD-10-CM | POA: Diagnosis not present

## 2021-01-31 MED ORDER — SUTAB 1479-225-188 MG PO TABS: 1.0000 | ORAL_TABLET | ORAL | 0 refills | Status: DC

## 2021-01-31 MED ORDER — NA SULFATE-K SULFATE-MG SULF 17.5-3.13-1.6 GM/177ML PO SOLN: 1.0000 | ORAL | 0 refills | Status: DC
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
## 2021-01-31 NOTE — Patient Instructions (Signed)
PROCEDURES: You have been scheduled for a colonoscopy. Please follow the written instructions given to you at your visit today. Please pick up your prep supplies at the pharmacy within the next 1-3 days. If you use inhalers (even only as needed), please bring them with you on the day of your procedure.  You will be contacted by our office prior to your procedure for directions on holding your Xarelto.  If you do not hear from our office 1 week prior to your scheduled procedure, please call [phone] to discuss.   It was great seeing you today! Thank you for entrusting me with your care and choosing [facility].  Tye Savoy, NP  The [place] [facility] providers would like to encourage you to use [facility] to communicate with providers for non-urgent requests or questions.  Due to long hold times on the telephone, sending your provider a message by [facility] may be faster and more efficient way to get a response. Please allow 48 business hours for a response.  Please remember that this is for non-urgent requests/questions.  If you are age 4 or older, your body mass index should be between 23-30. Your Body mass index is 34.93 kg/m. If this is out of the aforementioned range listed, please consider follow up with your Primary Care Provider.  If you are age 6 or younger, your body mass index should be between 19-25. Your Body mass index is 34.93 kg/m. If this is out of the aformentioned range listed, please consider follow up with your Primary Care Provider.

## 2021-02-01 DIAGNOSIS — I10 Essential (primary) hypertension: Secondary | ICD-10-CM | POA: Diagnosis not present

## 2021-02-01 DIAGNOSIS — G4733 Obstructive sleep apnea (adult) (pediatric): Secondary | ICD-10-CM | POA: Diagnosis not present

## 2021-02-01 NOTE — Progress Notes (Signed)
Agree with assessment and plan as outlined.  

## 2021-02-04 ENCOUNTER — Ambulatory Visit: Payer: Medicare Other | Admitting: Diagnostic Neuroimaging

## 2021-02-06 ENCOUNTER — Encounter (HOSPITAL_COMMUNITY): Payer: Self-pay | Admitting: Emergency Medicine

## 2021-02-06 ENCOUNTER — Observation Stay (HOSPITAL_COMMUNITY)
Admission: EM | Admit: 2021-02-06 | Discharge: 2021-02-07 | Disposition: A | Discharge status: Home / Self Care | Payer: Medicare Other | Attending: Emergency Medicine | Admitting: Emergency Medicine

## 2021-02-06 ENCOUNTER — Emergency Department (HOSPITAL_COMMUNITY): Payer: Medicare Other

## 2021-02-06 DIAGNOSIS — R200 Anesthesia of skin: Secondary | ICD-10-CM | POA: Diagnosis not present

## 2021-02-06 DIAGNOSIS — Z20822 Contact with and (suspected) exposure to covid-19: Secondary | ICD-10-CM | POA: Insufficient documentation

## 2021-02-06 DIAGNOSIS — Z7951 Long term (current) use of inhaled steroids: Secondary | ICD-10-CM | POA: Diagnosis not present

## 2021-02-06 DIAGNOSIS — I110 Hypertensive heart disease with heart failure: Secondary | ICD-10-CM | POA: Diagnosis not present

## 2021-02-06 DIAGNOSIS — Z87891 Personal history of nicotine dependence: Secondary | ICD-10-CM | POA: Insufficient documentation

## 2021-02-06 DIAGNOSIS — R41841 Cognitive communication deficit: Secondary | ICD-10-CM | POA: Diagnosis not present

## 2021-02-06 DIAGNOSIS — I4891 Unspecified atrial fibrillation: Secondary | ICD-10-CM | POA: Diagnosis present

## 2021-02-06 DIAGNOSIS — G4733 Obstructive sleep apnea (adult) (pediatric): Secondary | ICD-10-CM | POA: Diagnosis present

## 2021-02-06 DIAGNOSIS — Z79899 Other long term (current) drug therapy: Secondary | ICD-10-CM | POA: Diagnosis not present

## 2021-02-06 DIAGNOSIS — F32A Depression, unspecified: Secondary | ICD-10-CM | POA: Diagnosis present

## 2021-02-06 DIAGNOSIS — I5032 Chronic diastolic (congestive) heart failure: Secondary | ICD-10-CM | POA: Diagnosis not present

## 2021-02-06 DIAGNOSIS — I2510 Atherosclerotic heart disease of native coronary artery without angina pectoris: Secondary | ICD-10-CM | POA: Diagnosis not present

## 2021-02-06 DIAGNOSIS — H5340 Unspecified visual field defects: Secondary | ICD-10-CM

## 2021-02-06 DIAGNOSIS — Z7901 Long term (current) use of anticoagulants: Secondary | ICD-10-CM | POA: Diagnosis not present

## 2021-02-06 DIAGNOSIS — J45909 Unspecified asthma, uncomplicated: Secondary | ICD-10-CM | POA: Diagnosis not present

## 2021-02-06 DIAGNOSIS — I639 Cerebral infarction, unspecified: Secondary | ICD-10-CM | POA: Diagnosis not present

## 2021-02-06 DIAGNOSIS — R9431 Abnormal electrocardiogram [ECG] [EKG]: Secondary | ICD-10-CM | POA: Diagnosis not present

## 2021-02-06 DIAGNOSIS — E785 Hyperlipidemia, unspecified: Secondary | ICD-10-CM | POA: Diagnosis present

## 2021-02-06 DIAGNOSIS — F419 Anxiety disorder, unspecified: Secondary | ICD-10-CM | POA: Diagnosis present

## 2021-02-06 DIAGNOSIS — R519 Headache, unspecified: Secondary | ICD-10-CM | POA: Diagnosis not present

## 2021-02-06 DIAGNOSIS — I10 Essential (primary) hypertension: Secondary | ICD-10-CM | POA: Diagnosis present

## 2021-02-06 LAB — DIFFERENTIAL
Abs Immature Granulocytes: 0.02 10*3/uL (ref 0.00–0.07)
Basophils Absolute: 0.1 10*3/uL (ref 0.0–0.1)
Basophils Relative: 1 %
Eosinophils Absolute: 0.3 10*3/uL (ref 0.0–0.5)
Eosinophils Relative: 4 %
Immature Granulocytes: 0 %
Lymphocytes Relative: 19 %
Lymphs Abs: 1.3 10*3/uL (ref 0.7–4.0)
Monocytes Absolute: 0.7 10*3/uL (ref 0.1–1.0)
Monocytes Relative: 10 %
Neutro Abs: 4.5 10*3/uL (ref 1.7–7.7)
Neutrophils Relative %: 66 %

## 2021-02-06 LAB — COMPREHENSIVE METABOLIC PANEL
ALT: 19 U/L (ref 0–44)
AST: 20 U/L (ref 15–41)
Albumin: 3.9 g/dL (ref 3.5–5.0)
Alkaline Phosphatase: 77 U/L (ref 38–126)
Anion gap: 10 (ref 5–15)
BUN: 12 mg/dL (ref 8–23)
CO2: 29 mmol/L (ref 22–32)
Calcium: 9.9 mg/dL (ref 8.9–10.3)
Chloride: 102 mmol/L (ref 98–111)
Creatinine, Ser: 0.92 mg/dL (ref 0.44–1.00)
GFR, Estimated: >60 mL/min (ref >60)
Glucose, Bld: 89 mg/dL (ref 70–99)
Potassium: 4.3 mmol/L (ref 3.5–5.1)
Sodium: 141 mmol/L (ref 135–145)
Total Bilirubin: 0.6 mg/dL (ref 0.3–1.2)
Total Protein: 7.5 g/dL (ref 6.5–8.1)

## 2021-02-06 LAB — RAPID URINE DRUG SCREEN, HOSP PERFORMED
Amphetamines: NOT DETECTED
Barbiturates: NOT DETECTED
Benzodiazepines: NOT DETECTED
Cocaine: NOT DETECTED
Opiates: NOT DETECTED
Tetrahydrocannabinol: NOT DETECTED

## 2021-02-06 LAB — URINALYSIS, ROUTINE W REFLEX MICROSCOPIC
Bilirubin Urine: NEGATIVE
Glucose, UA: NEGATIVE mg/dL
Hgb urine dipstick: NEGATIVE
Ketones, ur: NEGATIVE mg/dL
Leukocytes,Ua: NEGATIVE
Nitrite: NEGATIVE
Protein, ur: NEGATIVE mg/dL
Specific Gravity, Urine: <1.005 — ABNORMAL LOW (ref 1.005–1.030)
pH: 6.5 (ref 5.0–8.0)

## 2021-02-06 LAB — CBC
HCT: 42.8 % (ref 36.0–46.0)
Hemoglobin: 13.7 g/dL (ref 12.0–15.0)
MCH: 29.3 pg (ref 26.0–34.0)
MCHC: 32 g/dL (ref 30.0–36.0)
MCV: 91.6 fL (ref 80.0–100.0)
Platelets: 342 10*3/uL (ref 150–400)
RBC: 4.67 MIL/uL (ref 3.87–5.11)
RDW: 13.8 % (ref 11.5–15.5)
WBC: 6.8 10*3/uL (ref 4.0–10.5)
nRBC: 0 % (ref 0.0–0.2)

## 2021-02-06 LAB — RESP PANEL BY RT-PCR (FLU A&B, COVID) ARPGX2
Influenza A by PCR: NEGATIVE
Influenza B by PCR: NEGATIVE
SARS Coronavirus 2 by RT PCR: NEGATIVE

## 2021-02-06 LAB — APTT: aPTT: 37 seconds — ABNORMAL HIGH (ref 24–36)

## 2021-02-06 LAB — PROTIME-INR
INR: 1.5 — ABNORMAL HIGH (ref 0.8–1.2)
Prothrombin Time: 17.9 seconds — ABNORMAL HIGH (ref 11.4–15.2)

## 2021-02-06 IMAGING — CT CT HEAD W/O CM
4 series · 17 of 47 positions shown, 19 images · non-contrast
Comparison: None.

CLINICAL DATA: Visual field deficits in the right eye, headaches,
left-sided numbness



[Series 3: head without · axial · non-contrast · 0.45mm/px · z∈[-119,+6]mm · 7 of 35 slices shown, 9 images]
[im 5/35  brain]
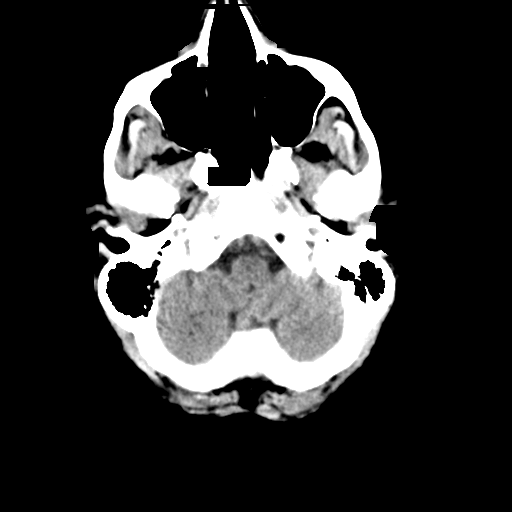
[im 5/35  bone]
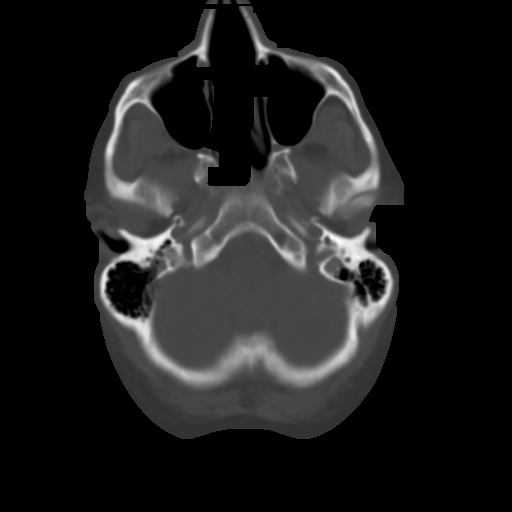
[im 9/35  brain]
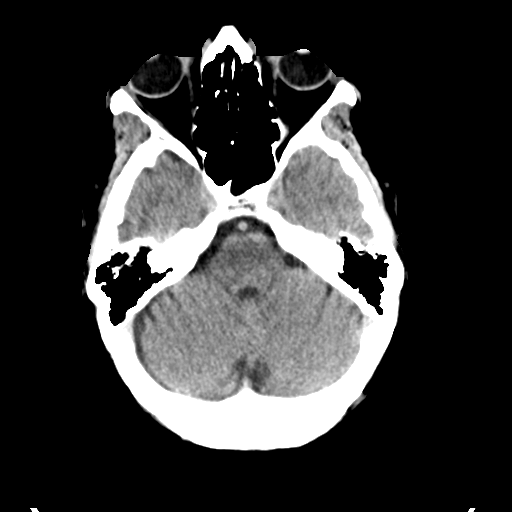
[im 13/35  brain]
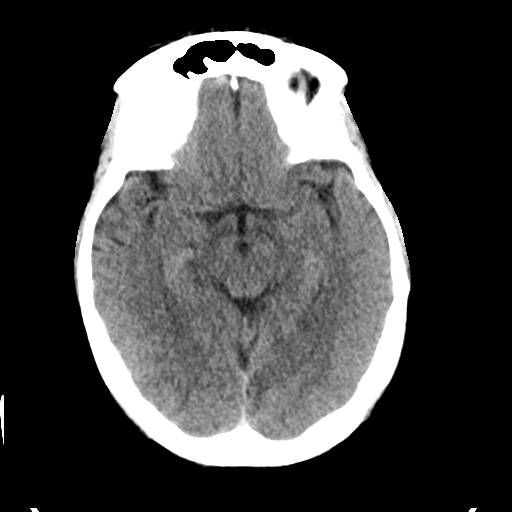
[im 18/35  brain]
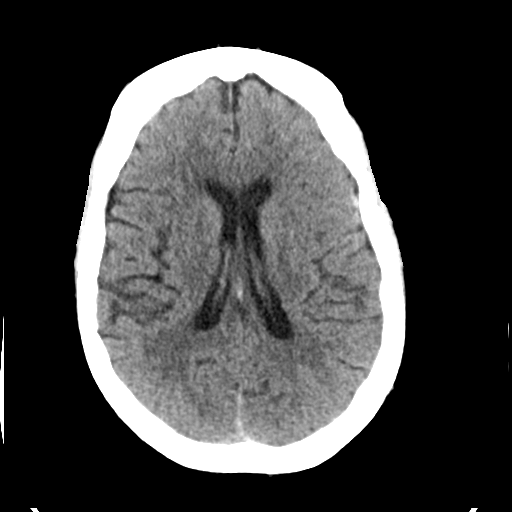
[im 22/35  brain]
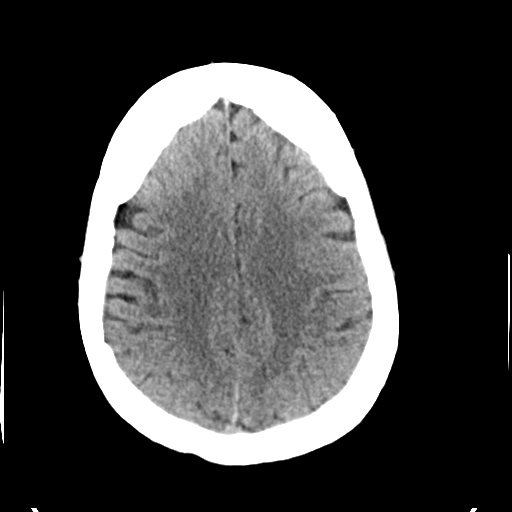
[im 22/35  bone]
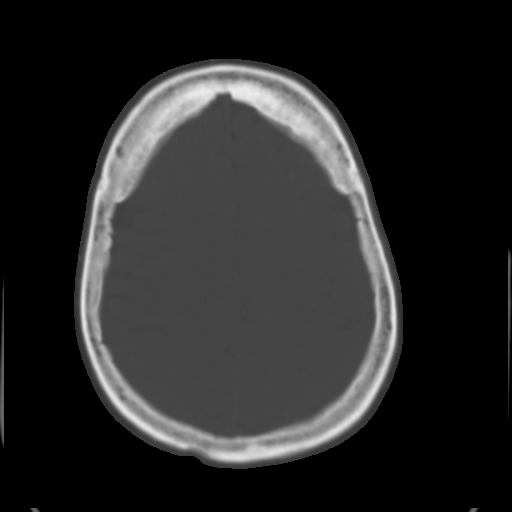
[im 26/35  brain]
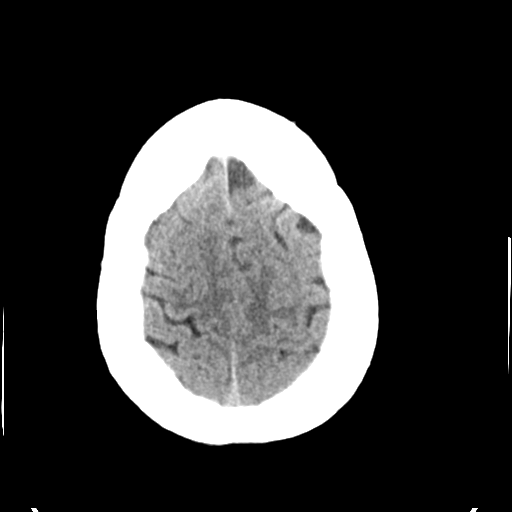
[im 30/35  brain]
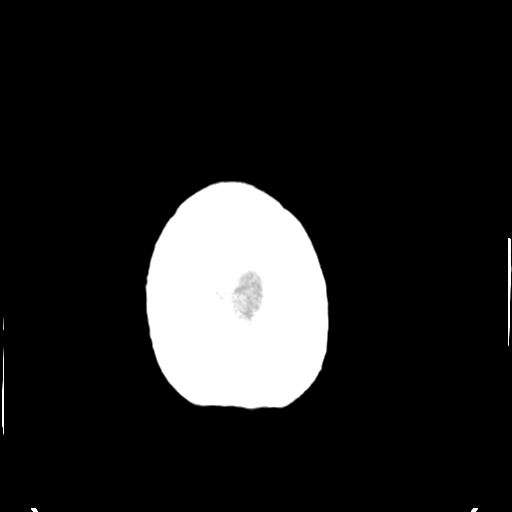

[Series 4: head bone · axial · 0.45mm/px · z∈[-123,-63]mm · 4 of 86 slices shown]
[im 9/86  bone]
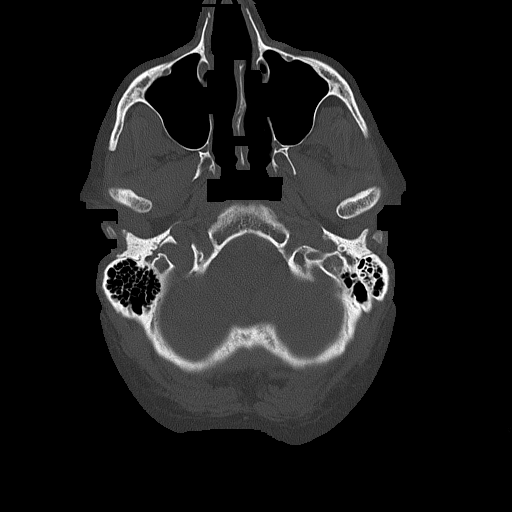
[im 18/86  bone]
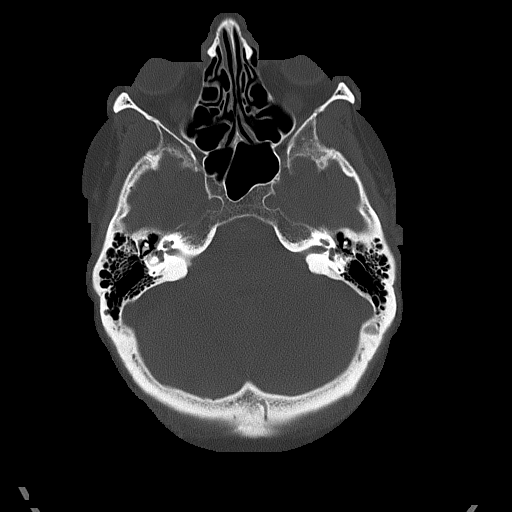
[im 26/86  bone]
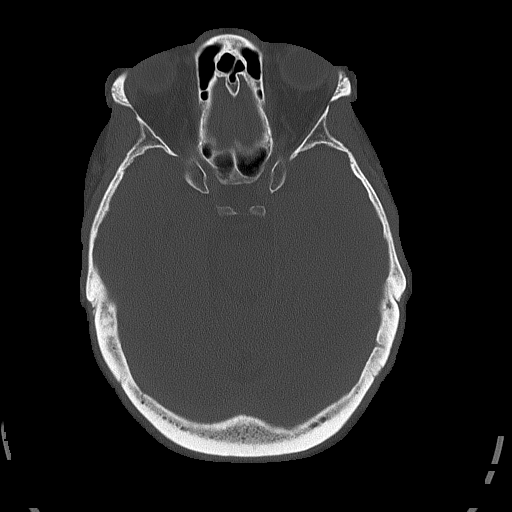
[im 39/86  bone]
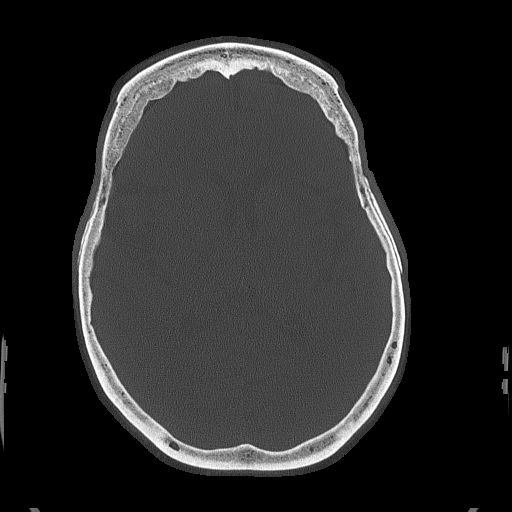

[Series 5: head without cor · coronal · non-contrast · 0.32mm/px · 3 of 73 slices shown]
[im 25/73  brain]
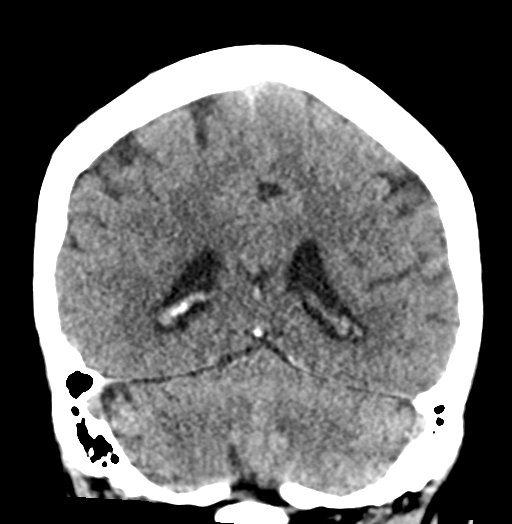
[im 33/73  brain]
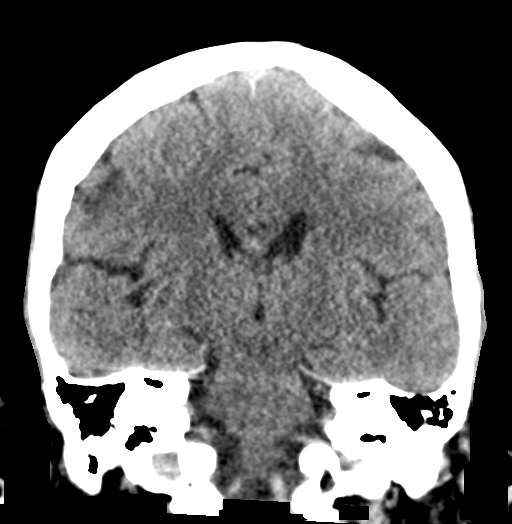
[im 41/73  brain]
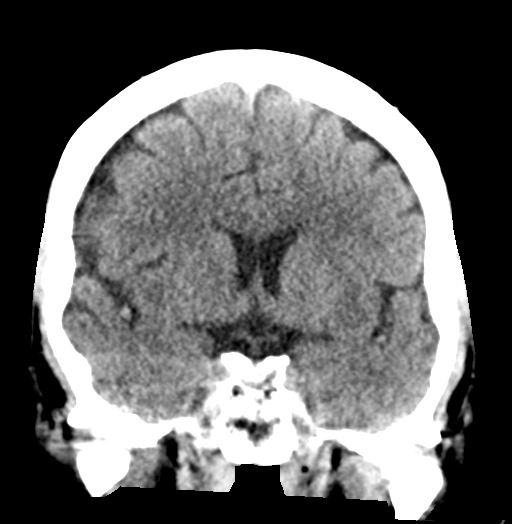

[Series 6: head without sag · sagittal · non-contrast · 0.33mm/px · 3 of 56 slices shown]
[im 19/56  brain]
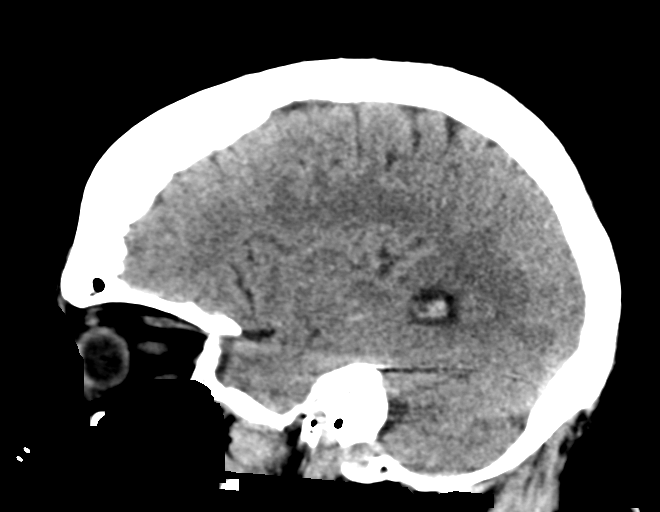
[im 28/56  brain]
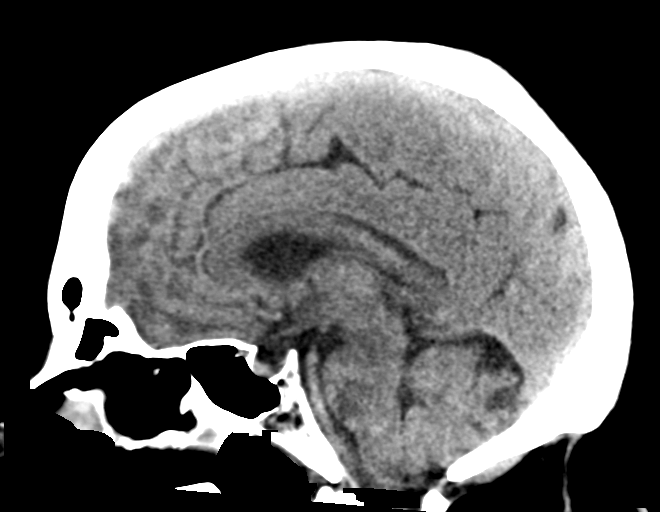
[im 37/56  brain]
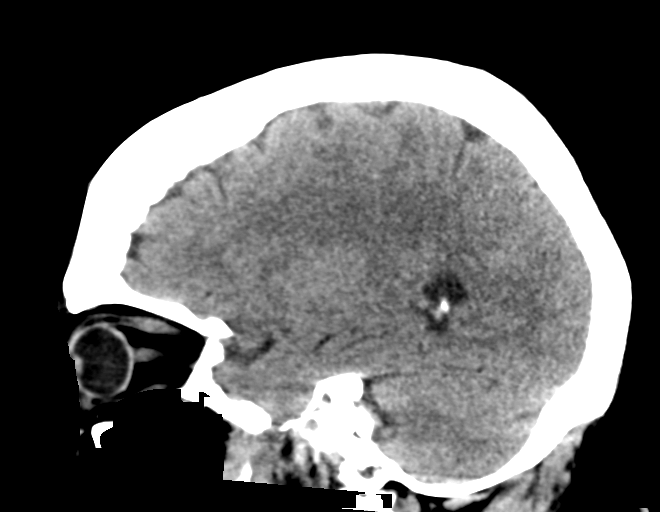

[17 of 47 positions shown; findings below may reference images not displayed]

FINDINGS: Brain: No acute intracranial findings are seen. There are no signs
of bleeding. Ventricles are not dilated. There is no focal edema or
mass effect. Cortical sulci are prominent.

Vascular: Unremarkable.

Skull: Unremarkable.

Sinuses/Orbits: Unremarkable.

Other: None
IMPRESSION: No acute intracranial findings are seen in noncontrast CT brain.

## 2021-02-06 IMAGING — MR MR HEAD W/O CM
10 of 14 series · 28 of 48 positions shown · non-contrast
Comparison: Same day CT head.

CLINICAL DATA: Neuro deficit, acute, stroke suspected Vision loss,
binocular right upper visual field lost



[Series 3: DWI · axial · 3.0mm · 1.09mm/px · z∈[-5,+138]mm · 6 of 98 slices shown (1 of 4)]
[im 1/98]
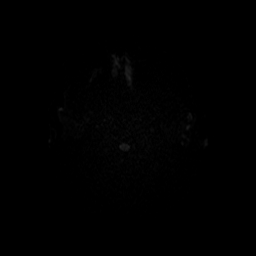
[im 20/98]
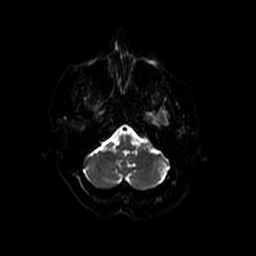
[im 39/98]
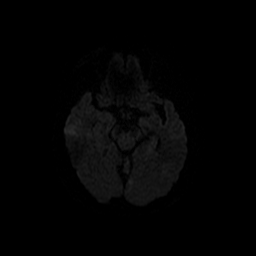
[im 59/98]
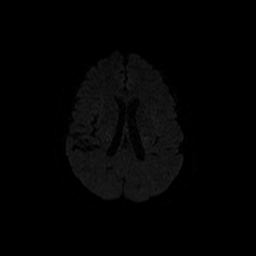
[im 78/98]
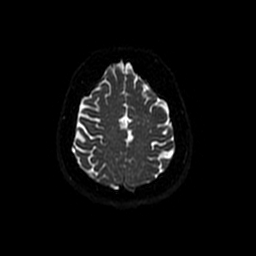
[im 98/98]
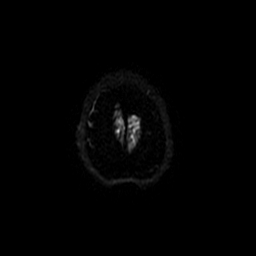

[Series 4: (id) mt fs · axial · 1.4mm · 0.43mm/px · z∈[-6,+41]mm · 4 of 136 slices shown]
[im 1/136]
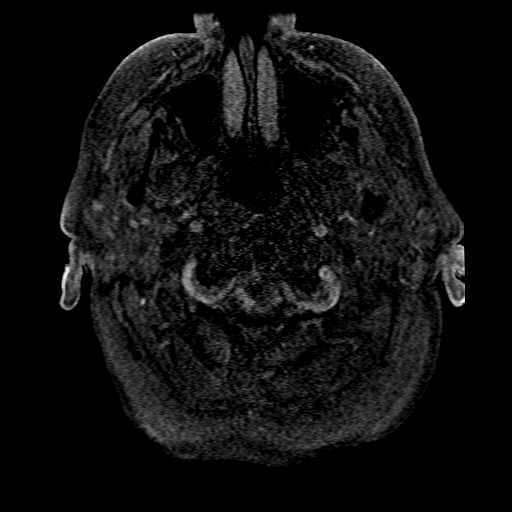
[im 23/136]
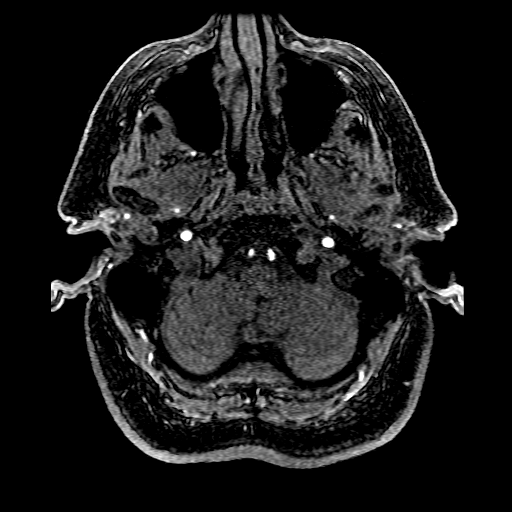
[im 46/136]
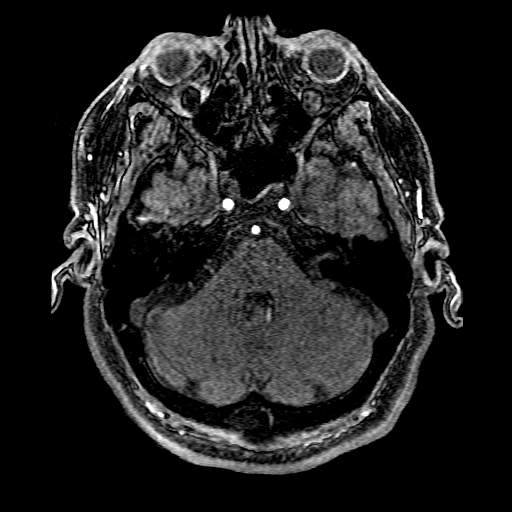
[im 68/136]
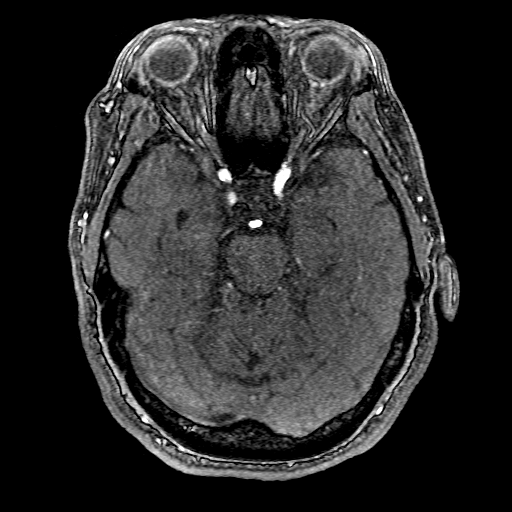

[Series 5: DWI · coronal · 5.0mm · 1.09mm/px · 4 of 68 slices shown (2 of 4)]
[im 1/68]
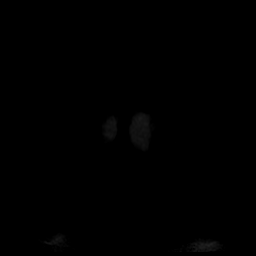
[im 23/68]
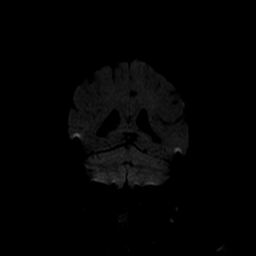
[im 45/68]
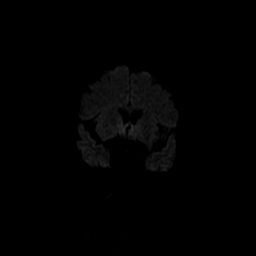
[im 68/68]
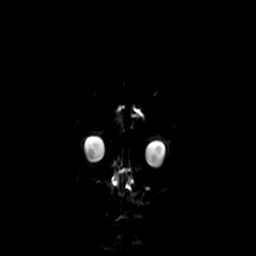

[Series 6: T1 · sagittal · 5.0mm · 0.47mm/px · 1 of 23 slices shown (1 of 2)]
[im 1/23]
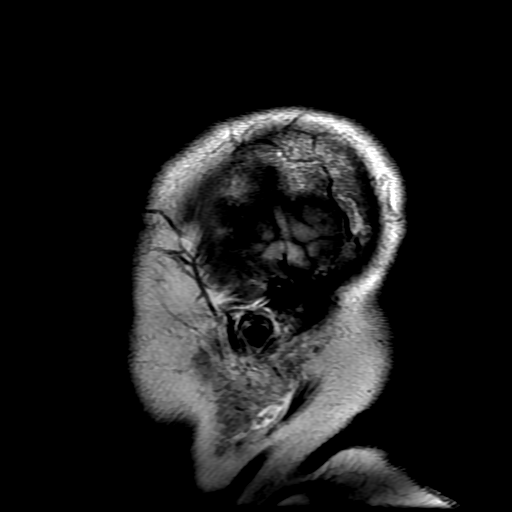

[Series 7: T2 · axial · 5.0mm · 0.43mm/px · 1 of 25 slices shown (1 of 2)]
[im 1/25]
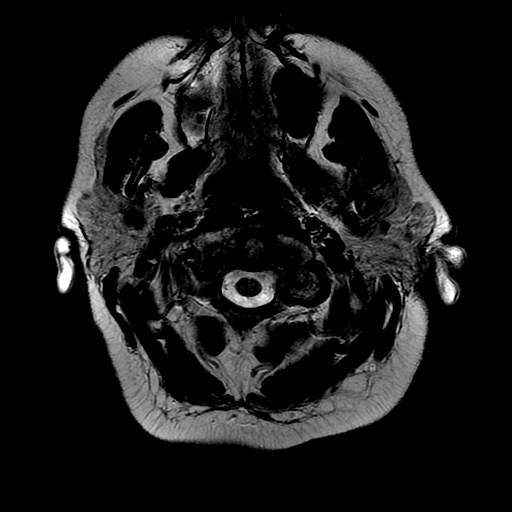

[Series 8: FLAIR · axial · 3.0mm · 0.43mm/px · 1 of 25 slices shown]
[im 1/25]
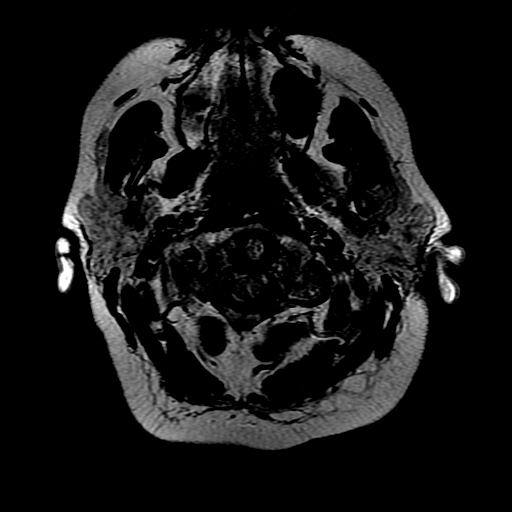

[Series 10: T1 · axial · 3.0mm · 0.47mm/px · z∈[+9,+155]mm · 5 of 100 slices shown (2 of 2)]
[im 1/100]
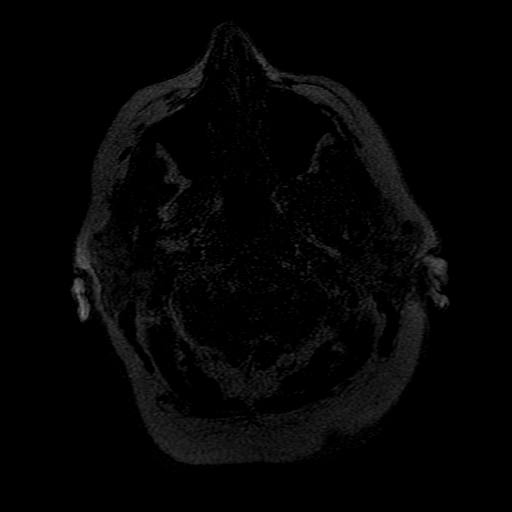
[im 25/100]
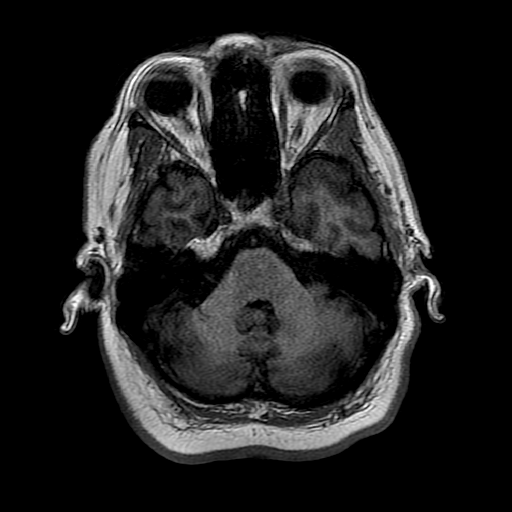
[im 50/100]
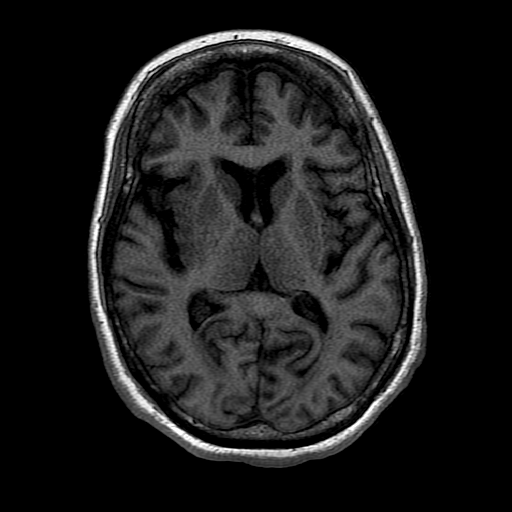
[im 75/100]
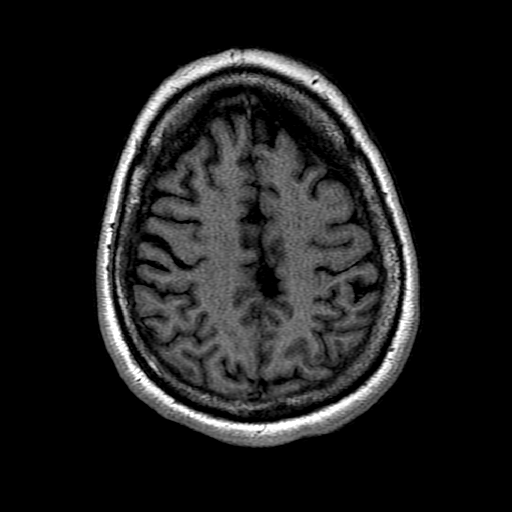
[im 100/100]
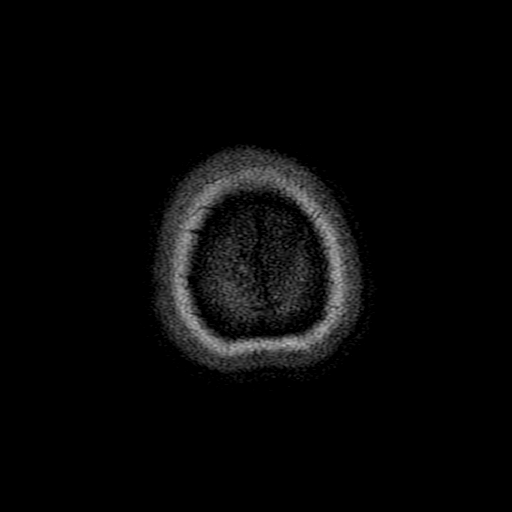

[Series 11: T2 · coronal · 5.0mm · 0.39mm/px · 1 of 26 slices shown (2 of 2)]
[im 1/26]
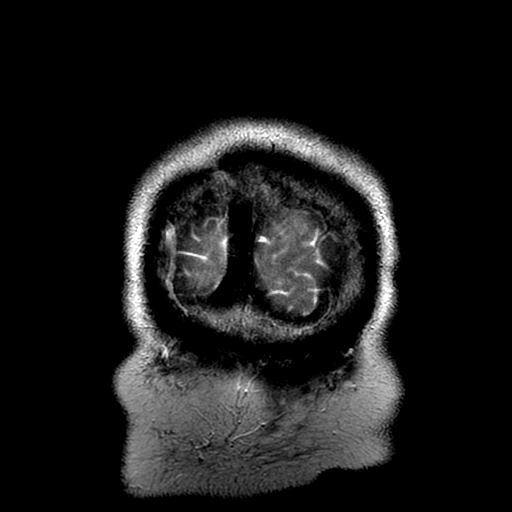

[Series 300: DWI · axial · 3.0mm · 1.09mm/px · z∈[-5,+138]mm · 3 of 49 slices shown (3 of 4)]
[im 1/49]
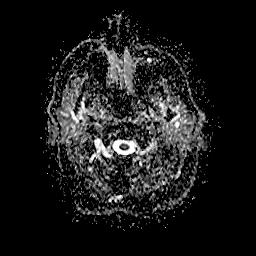
[im 25/49]
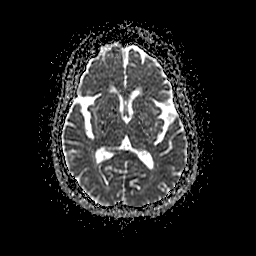
[im 49/49]
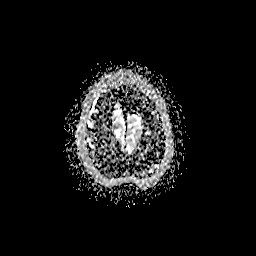

[Series 500: DWI · coronal · 5.0mm · 1.09mm/px · 2 of 34 slices shown (4 of 4)]
[im 1/34]
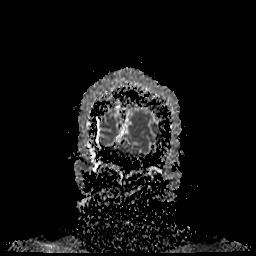
[im 34/34]
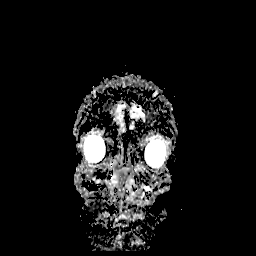

[28 of 48 positions shown; findings below may reference images not displayed]

FINDINGS: MRI HEAD FINDINGS

Brain: Acute medial left occipital infarct. Associated edema without
mass effect. No hydrocephalus. No mass lesion, midline shift,
extra-axial fluid collection, or acute hemorrhage. Patchy white
matter T2/FLAIR hyperintensities, nonspecific but compatible with
chronic microvascular ischemic disease.

Vascular: See below.

Skull and upper cervical spine: Normal marrow signal.

Sinuses/Orbits: Mild paranasal sinus mucosal thickening.
Unremarkable orbits.

Other: No mastoid effusions.

MRA HEAD FINDINGS

Anterior circulation: Bilateral intracranial ICAs, MCAs, and ACAs
are patent without proximal hemodynamically significant stenosis.
Small (1-2 mm) outpouching arising from the inferior aspect of the
right supraclinoid ICA (series 4, image 72; series [69], image 175))

Posterior circulation: Bilateral intradural vertebral arteries,
basilar artery and posterior cerebral arteries are patent without
proximal hemodynamically significant stenosis. No aneurysm
identified.

MRA NECK FINDINGS

Right carotid system: Limited evaluation proximally without evidence
of visible significant (greater than 50%) stenosis.

Left carotid system: Limited evaluation proximally without evidence
of visible significant (greater than 50%) stenosis.

Vertebral arteries: Limited evaluation proximally without evidence
of visible significant (greater than 50%) stenosis. Tortuous left
vertebral artery
IMPRESSION: MRI:

Acute medial left occipital infarct. Associated edema without mass
effect.

MRA head:

1. No large vessel occlusion or proximal hemodynamically significant
stenosis.
2. Small (1-2 mm) outpouching arising from the inferior aspect of
the right supraclinoid ICA, which may represent an aneurysm versus
an infundibulum with vessel not well seen by MRA. A follow-up CTA
could further characterize if clinically indicated.

MRA neck:

Limited study proximally without evidence of significant (greater
than 50%) stenosis.

## 2021-02-06 IMAGING — MR MR MRA NECK W/O CM
2 series · 40 of 48 positions shown · non-contrast
Comparison: Same day CT head.

CLINICAL DATA: Neuro deficit, acute, stroke suspected Vision loss,
binocular right upper visual field lost



[Series 13: ax (id) fspgr · axial · 2.2mm · 0.86mm/px · z∈[-86,+84]mm · 31 of 131 slices shown]
[im 1/131]
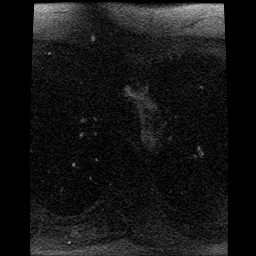
[im 4/131]
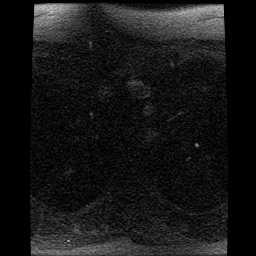
[im 8/131]
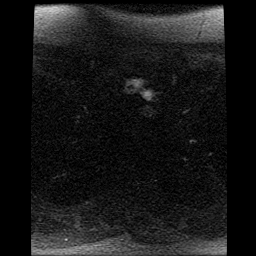
[im 12/131]
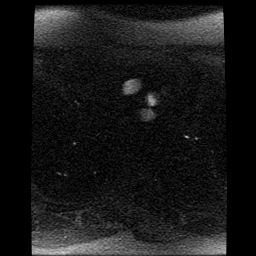
[im 15/131]
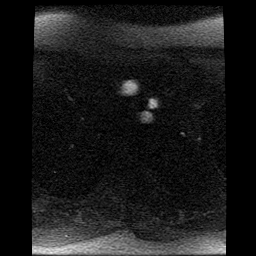
[im 19/131]
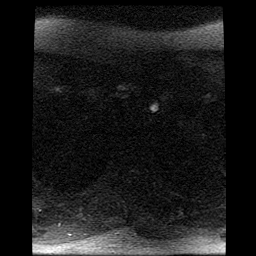
[im 23/131]
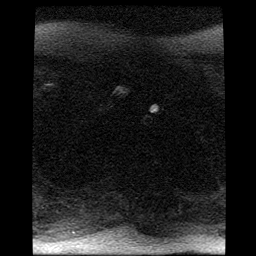
[im 27/131]
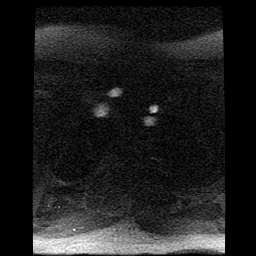
[im 30/131]
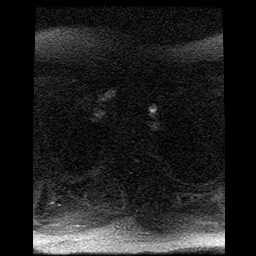
[im 34/131]
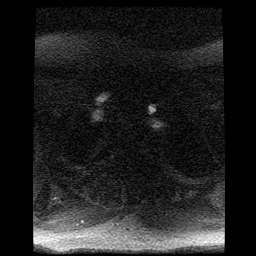
[im 38/131]
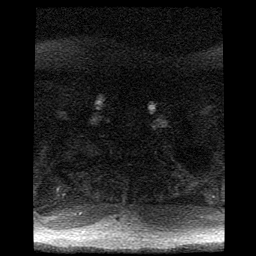
[im 41/131]
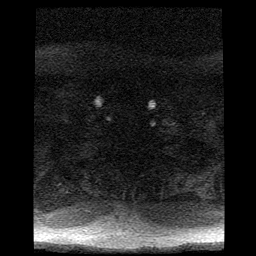
[im 45/131]
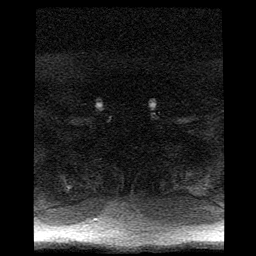
[im 49/131]
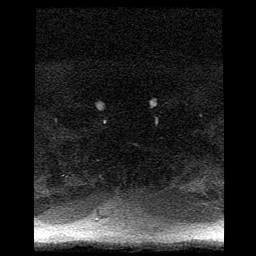
[im 53/131]
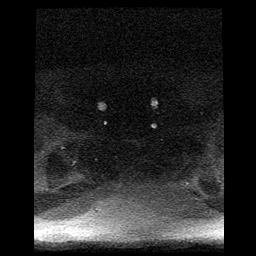
[im 56/131]
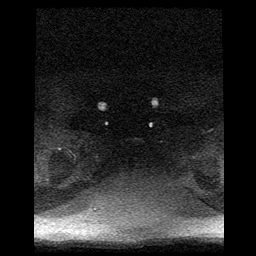
[im 60/131]
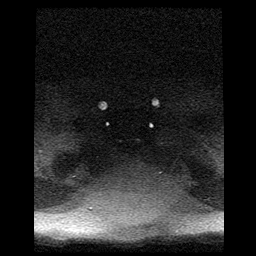
[im 64/131]
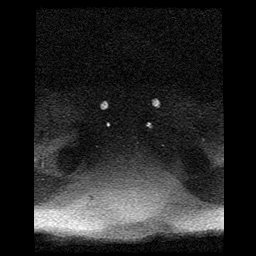
[im 67/131]
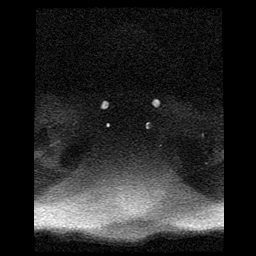
[im 71/131]
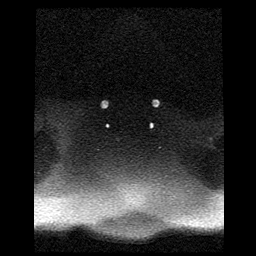
[im 75/131]
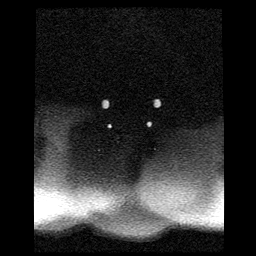
[im 79/131]
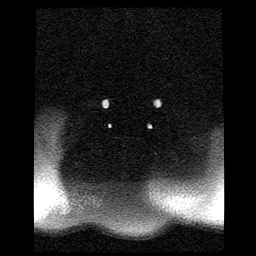
[im 82/131]
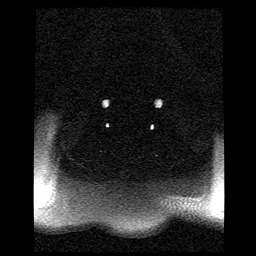
[im 86/131]
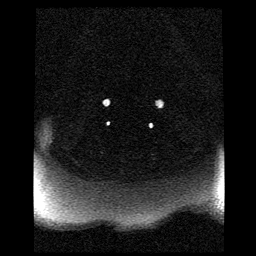
[im 90/131]
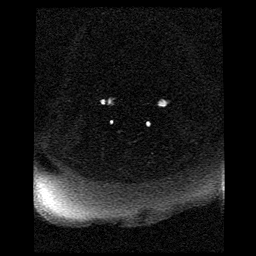
[im 93/131]
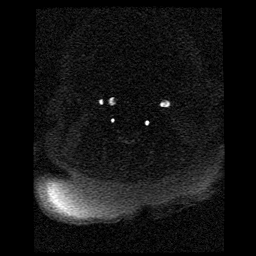
[im 97/131]
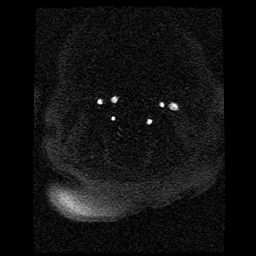
[im 101/131]
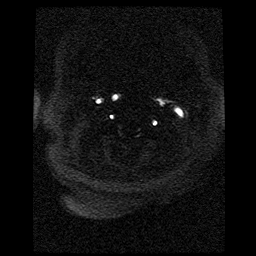
[im 108/131]
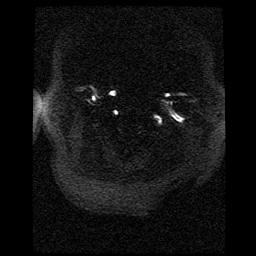
[im 112/131]
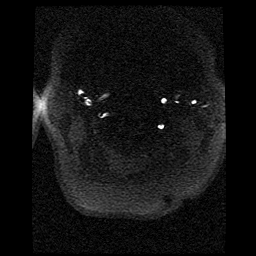
[im 123/131]
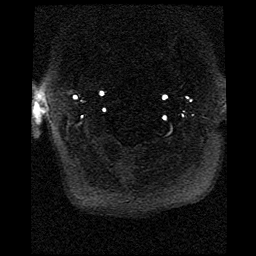

[Series 14: ax (id) · axial · 2.8mm · 0.47mm/px · z∈[+29,+90]mm · 9 of 44 slices shown]
[im 1/44]
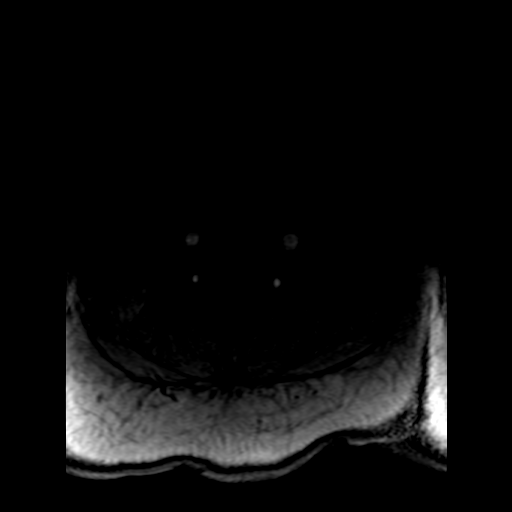
[im 8/44]
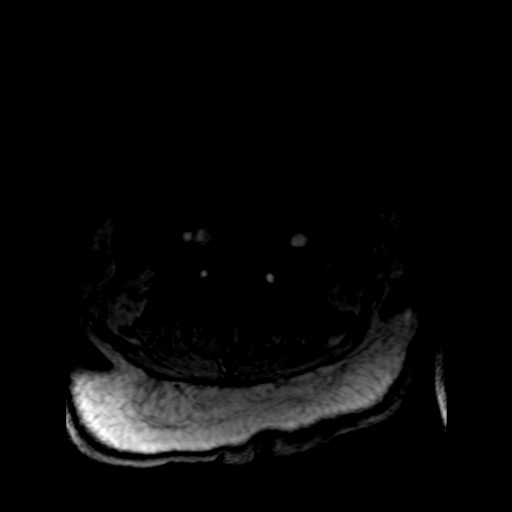
[im 12/44]
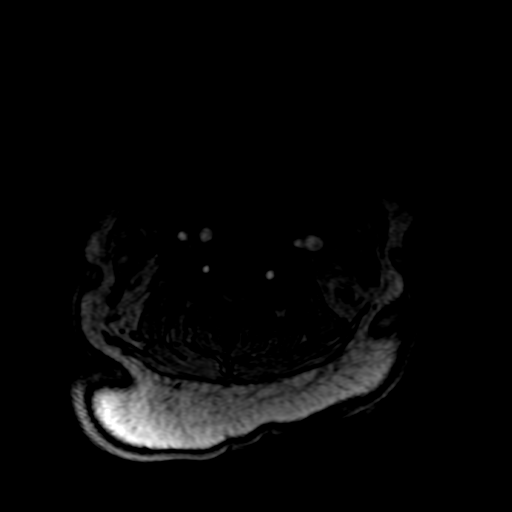
[im 20/44]
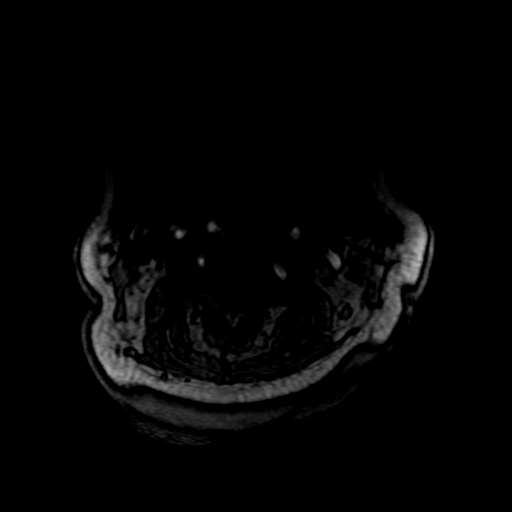
[im 24/44]
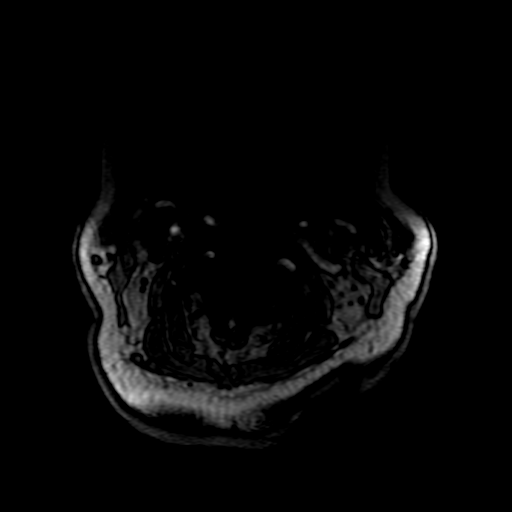
[im 32/44]
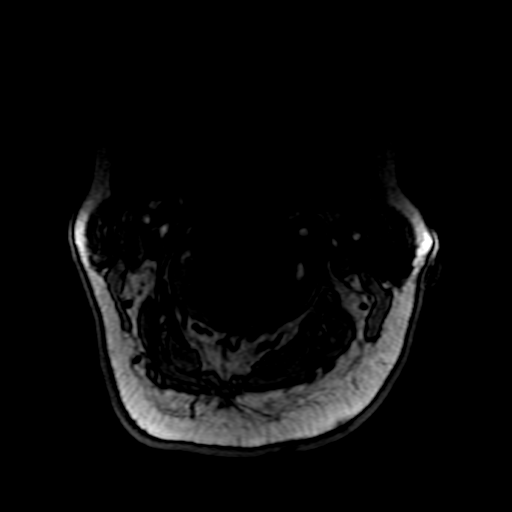
[im 36/44]
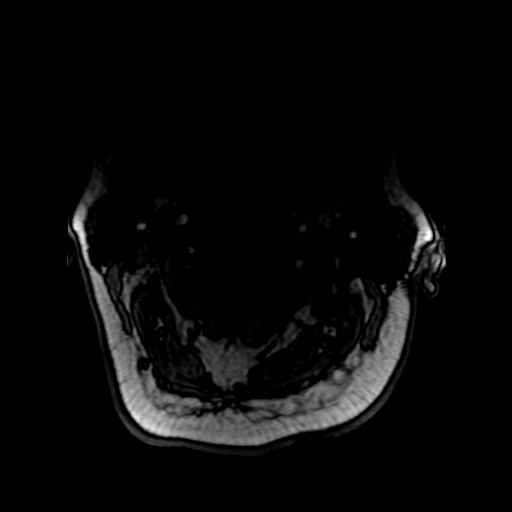
[im 40/44]
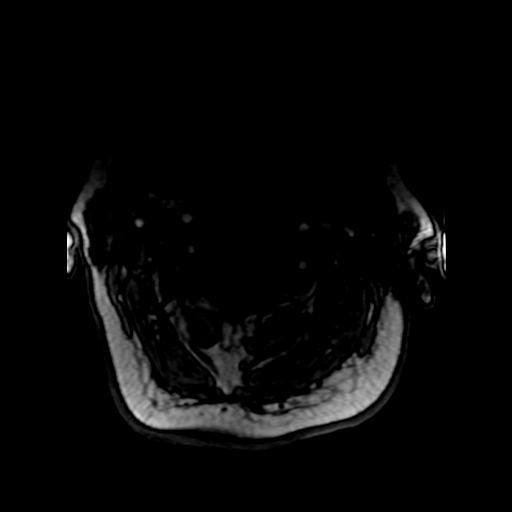
[im 44/44]
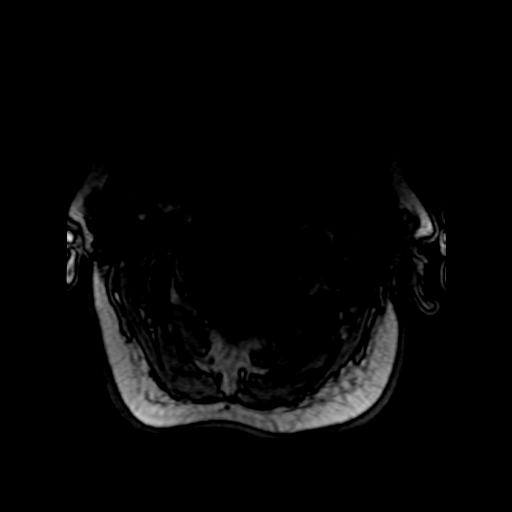

[40 of 48 positions shown; findings below may reference images not displayed]

FINDINGS: MRI HEAD FINDINGS

Brain: Acute medial left occipital infarct. Associated edema without
mass effect. No hydrocephalus. No mass lesion, midline shift,
extra-axial fluid collection, or acute hemorrhage. Patchy white
matter T2/FLAIR hyperintensities, nonspecific but compatible with
chronic microvascular ischemic disease.

Vascular: See below.

Skull and upper cervical spine: Normal marrow signal.

Sinuses/Orbits: Mild paranasal sinus mucosal thickening.
Unremarkable orbits.

Other: No mastoid effusions.

MRA HEAD FINDINGS

Anterior circulation: Bilateral intracranial ICAs, MCAs, and ACAs
are patent without proximal hemodynamically significant stenosis.
Small (1-2 mm) outpouching arising from the inferior aspect of the
right supraclinoid ICA (series 4, image 72; series [69], image 175))

Posterior circulation: Bilateral intradural vertebral arteries,
basilar artery and posterior cerebral arteries are patent without
proximal hemodynamically significant stenosis. No aneurysm
identified.

MRA NECK FINDINGS

Right carotid system: Limited evaluation proximally without evidence
of visible significant (greater than 50%) stenosis.

Left carotid system: Limited evaluation proximally without evidence
of visible significant (greater than 50%) stenosis.

Vertebral arteries: Limited evaluation proximally without evidence
of visible significant (greater than 50%) stenosis. Tortuous left
vertebral artery
IMPRESSION: MRI:

Acute medial left occipital infarct. Associated edema without mass
effect.

MRA head:

1. No large vessel occlusion or proximal hemodynamically significant
stenosis.
2. Small (1-2 mm) outpouching arising from the inferior aspect of
the right supraclinoid ICA, which may represent an aneurysm versus
an infundibulum with vessel not well seen by MRA. A follow-up CTA
could further characterize if clinically indicated.

MRA neck:

Limited study proximally without evidence of significant (greater
than 50%) stenosis.

## 2021-02-06 MED ORDER — METOPROLOL TARTRATE 5 MG/5ML IV SOLN: 5.0000 mg | Freq: Four times a day (QID) | INTRAVENOUS | Status: DC | PRN

## 2021-02-06 MED ORDER — RIVAROXABAN 10 MG PO TABS: 20.0000 mg | ORAL_TABLET | Freq: Every day | ORAL | Status: DC

## 2021-02-06 MED ORDER — STROKE: EARLY STAGES OF RECOVERY BOOK
Freq: Once | Status: AC
  Filled 2021-02-06: qty 1

## 2021-02-06 MED ORDER — ASPIRIN 325 MG PO TABS
325.0000 mg | ORAL_TABLET | Freq: Every day | ORAL | Status: DC
  Administered 2021-02-06: 325 mg via ORAL
  Filled 2021-02-06: qty 1

## 2021-02-06 MED ORDER — ASPIRIN 81 MG PO CHEW: 81.0000 mg | CHEWABLE_TABLET | Freq: Once | ORAL | Status: DC

## 2021-02-06 MED ORDER — DIPHENHYDRAMINE HCL 25 MG PO CAPS
25.0000 mg | ORAL_CAPSULE | Freq: Once | ORAL | Status: AC
Start: 2021-02-06 — End: 2021-02-06
  Administered 2021-02-06: 25 mg via ORAL
  Filled 2021-02-06: qty 1

## 2021-02-06 MED ORDER — ACETAMINOPHEN 325 MG PO TABS: 650.0000 mg | ORAL_TABLET | Freq: Four times a day (QID) | ORAL | Status: DC | PRN

## 2021-02-06 MED ORDER — ACETAMINOPHEN 650 MG RE SUPP: 650.0000 mg | Freq: Four times a day (QID) | RECTAL | Status: DC | PRN

## 2021-02-06 MED ORDER — SODIUM CHLORIDE 0.9 % IV SOLN: 250.0000 mL | INTRAVENOUS | Status: DC | PRN

## 2021-02-06 MED ORDER — ALBUTEROL SULFATE (2.5 MG/3ML) 0.083% IN NEBU: 3.0000 mL | INHALATION_SOLUTION | Freq: Four times a day (QID) | RESPIRATORY_TRACT | Status: DC | PRN

## 2021-02-06 MED ORDER — ROSUVASTATIN CALCIUM 5 MG PO TABS
10.0000 mg | ORAL_TABLET | Freq: Every day | ORAL | Status: DC
  Administered 2021-02-06 – 2021-02-07 (×2): 10 mg via ORAL
  Filled 2021-02-06 (×2): qty 2

## 2021-02-06 MED ORDER — SODIUM CHLORIDE 0.9% FLUSH: 3.0000 mL | INTRAVENOUS | Status: DC | PRN

## 2021-02-06 MED ORDER — DULOXETINE HCL 30 MG PO CPEP
30.0000 mg | ORAL_CAPSULE | Freq: Every day | ORAL | Status: DC
  Administered 2021-02-07: 30 mg via ORAL
  Filled 2021-02-06 (×2): qty 1

## 2021-02-06 MED ORDER — LORAZEPAM 0.5 MG PO TABS: 0.5000 mg | ORAL_TABLET | Freq: Three times a day (TID) | ORAL | Status: DC | PRN

## 2021-02-06 MED ORDER — PROCHLORPERAZINE EDISYLATE 10 MG/2ML IJ SOLN
10.0000 mg | Freq: Once | INTRAMUSCULAR | Status: AC
  Administered 2021-02-06: 10 mg via INTRAVENOUS
  Filled 2021-02-06: qty 2

## 2021-02-06 MED ORDER — SODIUM CHLORIDE 0.9% FLUSH
3.0000 mL | Freq: Two times a day (BID) | INTRAVENOUS | Status: DC
  Administered 2021-02-07: 3 mL via INTRAVENOUS

## 2021-02-06 MED ORDER — ASPIRIN EC 81 MG PO TBEC: 81.0000 mg | DELAYED_RELEASE_TABLET | Freq: Every day | ORAL | Status: DC

## 2021-02-06 NOTE — Assessment & Plan Note (Addendum)
Hx ablation x2, followed by Dr. Rayann Heman. Currently back in [facility]. Continue coreg, prn flecainide

## 2021-02-06 NOTE — Assessment & Plan Note (Signed)
Stable, no signs of exacerbation Continue albuterol prn

## 2021-02-06 NOTE — ED Provider Triage Note (Signed)
Emergency Medicine Provider Triage Evaluation Note  Shannon [name] , a 71 y.o. female  was evaluated in triage.  Patient has history of atrial fibrillation and is on anticoagulation.  Pt complains of vision change and right eyes starting yesterday morning.  She states that she cannot see laterally out of her vision in that eye.  Vision changes making it difficult to walk.  No arm or leg weakness, difficulty speaking.  She has had a headache, especially behind her eyes.  She states that she has been having some vision changes recently and ophthalmologist referred her to a neurologist.  Review of Systems  Positive: Vision change Negative: Speech difficulty  Physical Exam  There were no vitals taken for this visit. Gen:   Awake, no distress   Resp:  Normal effort  MSK:   Moves extremities without difficulty  Other:  R eye lateral visual field deficit  Medical Decision Making  Medically screening exam initiated at 1:01 PM.  Appropriate orders placed.  Shannon [name] was informed that the remainder of the evaluation will be completed by another provider, this initial triage assessment does not replace that evaluation, and the importance of remaining in the ED until their evaluation is complete.  No code stroke, greater than 24 hours since onset.   Carlisle Cater, PA-C 02/06/21 1303

## 2021-02-06 NOTE — Assessment & Plan Note (Addendum)
No signs of exacerbation, euvolemic Last echo: 3/21 EF of 60-65% with indeterminate diastolic parameters. Normal LVF.

## 2021-02-06 NOTE — Assessment & Plan Note (Addendum)
Goal LDL <70 in setting of acute CVA Starting crestor 10mg /daily

## 2021-02-06 NOTE — ED Provider Notes (Signed)
[facility] EMERGENCY DEPARTMENT Provider Note   CSN: [id number] Arrival date & time: 02/06/21  1213     History  Chief Complaint  Patient presents with   Visual Field Change    Providence Stivers is a 71 y.o. female.  Here with right-sided visual loss, headache.  Has atrial fibrillation on Xarelto.  Was seen by ophthalmologist about a week ago because she has been having some blurred vision at times.  She was told to follow-up with a neurologist but she does not know why.  She has not had vision loss until yesterday morning.  She states that vision loss happened before her headache.  Vision got better as the day went on but got worse again this morning.  Denies any weakness or numbness.  The history is provided by the patient.  Eye Problem Location:  Both eyes Severity:  Mild Onset quality:  Gradual Duration:  2 days Timing:  Constant Progression:  Worsening Chronicity:  New Relieved by:  Nothing Worsened by:  Nothing Ineffective treatments:  None tried Associated symptoms: blurred vision, decreased vision, double vision and headaches   Associated symptoms: no inflammation, no itching, no nausea, no numbness, no photophobia, no redness, no swelling, no vomiting and no weakness       Home Medications Prior to Admission medications   Medication Sig Start Date End Date Taking? Authorizing Provider  acetaminophen (TYLENOL) 650 MG CR tablet Take 650-1,300 mg by mouth every 8 (eight) hours as needed for pain.     [provider]  albuterol (VENTOLIN HFA) 108 (90 Base) MCG/ACT inhaler Inhale 1-2 puffs into the lungs every 6 (six) hours as needed for wheezing or shortness of breath. 12/23/19   Parrett, Fonnie Mu, NP  amLODipine (NORVASC) 5 MG tablet Take 1 tablet (5 mg total) by mouth daily. 10/04/20   Maximiano Coss, NP  carvedilol (COREG) 6.25 MG tablet TAKE 1 TABLET BY MOUTH IN  THE MORNING AND 2 TABLETS  BY MOUTH IN THE EVENING 01/01/21   Allred,  Jeneen Rinks, MD  Cholecalciferol (VITAMIN D) 50 MCG (2000 UT) CAPS Take 2,000 Units by mouth daily.    [provider]  DULoxetine (CYMBALTA) 30 MG capsule Take 1 capsule (30 mg total) by mouth daily. 10/04/20   Maximiano Coss, NP  flecainide (TAMBOCOR) 50 MG tablet Take 1 tablet (50 mg total) by mouth as needed. 01/04/21   Allred, Jeneen Rinks, MD  loratadine (CLARITIN) 10 MG tablet Take 10 mg by mouth at bedtime.    [provider]  LORazepam (ATIVAN) 0.5 MG tablet Take 1 tablet (0.5 mg total) by mouth every 8 (eight) hours as needed for anxiety. 07/10/20   Janith Lima, MD  Na Sulfate-K Sulfate-Mg Sulf (SUPREP BOWEL PREP KIT) 17.5-3.13-1.6 GM/177ML SOLN Take 1 kit by mouth as directed. For colonoscopy prep 01/31/21   Willia Craze, NP  rivaroxaban (XARELTO) 20 MG TABS tablet TAKE 1 TABLET BY MOUTH  DAILY WITH SUPPER 10/19/20   Allred, Jeneen Rinks, MD  traZODone (DESYREL) 50 MG tablet Take 1 tablet (50 mg total) by mouth at bedtime as needed for sleep. 07/10/20   Janith Lima, MD  triamcinolone (NASACORT) 55 MCG/ACT AERO nasal inhaler Place 2 sprays into the nose daily.    [provider]      Allergies    Ace inhibitors, Elemental sulfur, Hctz [hydrochlorothiazide], Oxycodone, Prednisone, Sulfa antibiotics, and Voltaren [diclofenac sodium]    Review of Systems   Review of Systems  Eyes:  Positive for blurred vision and double vision. Negative for photophobia, redness and itching.  Gastrointestinal:  Negative for nausea and vomiting.  Neurological:  Positive for headaches. Negative for weakness and numbness.   Physical Exam Updated Vital Signs BP (!) 162/75 (BP Location: Right Arm)    Pulse 64    Temp 98.6 F (37 C) (Oral)    Resp 18    SpO2 99%  Physical Exam Vitals and nursing note reviewed.  Constitutional:      General: She is not in acute distress.    Appearance: She is well-developed. She is not ill-appearing.  HENT:     Head: Normocephalic and atraumatic.      Right Ear: Tympanic membrane normal.     Left Ear: Tympanic membrane normal.     Nose: Nose normal.     Mouth/Throat:     Mouth: Mucous membranes are moist.  Eyes:     Extraocular Movements: Extraocular movements intact.     Conjunctiva/sclera: Conjunctivae normal.     Pupils: Pupils are equal, round, and reactive to light.     Comments: Patient with right homonymous superior quadrantanopsia, but otherwise visual field appears to be intact, has double vision with both eyes open that improves with each eye close  Cardiovascular:     Rate and Rhythm: Normal rate and regular rhythm.     Heart sounds: No murmur heard. Pulmonary:     Effort: Pulmonary effort is normal. No respiratory distress.     Breath sounds: Normal breath sounds.  Abdominal:     Palpations: Abdomen is soft.     Tenderness: There is no abdominal tenderness.  Musculoskeletal:        General: No swelling.     Cervical back: Normal range of motion and neck supple.  Skin:    General: Skin is warm and dry.     Capillary Refill: Capillary refill takes less than 2 seconds.  Neurological:     General: No focal deficit present.     Mental Status: She is alert and oriented to person, place, and time.     Cranial Nerves: No cranial nerve deficit.     Sensory: No sensory deficit.     Motor: No weakness.     Coordination: Coordination normal.     Comments: 5+ out of 5 strength, normal sensation, no drift, normal finger-nose-finger, right-sided visual field deficit with decreased vision in the superior visual field in both the right and left eye, normal speech  Psychiatric:        Mood and Affect: Mood normal.    ED Results / Procedures / Treatments   Labs (all labs ordered are listed, but only abnormal results are displayed) Labs Reviewed  PROTIME-INR - Abnormal; Notable for the following components:      Result Value   Prothrombin Time 17.9 (*)    INR 1.5 (*)    All other components within normal limits  APTT -  Abnormal; Notable for the following components:   aPTT 37 (*)    All other components within normal limits  RESP PANEL BY RT-PCR (FLU A&B, COVID) ARPGX2  CBC  DIFFERENTIAL  COMPREHENSIVE METABOLIC PANEL  RAPID URINE DRUG SCREEN, HOSP PERFORMED  URINALYSIS, ROUTINE W REFLEX MICROSCOPIC    EKG None  Radiology CT HEAD WO CONTRAST  Result Date: 02/06/2021 CLINICAL DATA:  Visual field deficits in the right eye, headaches, left-sided numbness EXAM: CT HEAD WITHOUT CONTRAST TECHNIQUE: Contiguous axial images were obtained from the base of the  skull through the vertex without intravenous contrast. RADIATION DOSE REDUCTION: This exam was performed according to the departmental dose-optimization program which includes automated exposure control, adjustment of the mA and/or kV according to patient size and/or use of iterative reconstruction technique. COMPARISON:  None. FINDINGS: Brain: No acute intracranial findings are seen. There are no signs of bleeding. Ventricles are not dilated. There is no focal edema or mass effect. Cortical sulci are prominent. Vascular: Unremarkable. Skull: Unremarkable. Sinuses/Orbits: Unremarkable. Other: None IMPRESSION: No acute intracranial findings are seen in noncontrast CT brain. Electronically Signed   By: Elmer Picker M.D.   On: 02/06/2021 13:36    Procedures Procedures    Medications Ordered in ED Medications  prochlorperazine (COMPAZINE) injection 10 mg (has no administration in time range)  diphenhydrAMINE (BENADRYL) capsule 25 mg (has no administration in time range)    ED Course/ Medical Decision Making/ A&P                           Medical Decision Making Amount and/or Complexity of Data Reviewed Labs: ordered. Radiology: ordered.   Keslee Harrington is here with visual field loss.  History of atrial fibrillation on Xarelto, CAD, reflux.  States that yesterday morning she noticed decreased vision in her right upper visual field.  Got  better as the day went on but worse again this morning.  Developed a headache as the day went on because she thought she was straining to look.  She saw ophthalmologist last week because of some blurred vision at times but she has never had complete loss of vision.  She states that she was referred to neurology but does not know why and that her eye exam is overall normal she thinks.  On exam she does appear to have a right homonymous superior quadrantanopsia.  Otherwise her neurologic exam is normal.  She is already had a head CT prior to my evaluation per my interpretation shows no head bleed or other acute findings.  Radiology also with normal head CT read.  Given her recent ophthalmology exam and this seems to be less likely an eye and will further pursue stroke work-up with MRI and MRA of head and neck after talking with Dr. Theda Sers with neurology on the phone.  Basic labs including CBC, BMP to be ordered.  Differential includes stroke versus complex migraine versus ocular issue.  My review and interpretation of her labs show no significant anemia, electrolyte abnormality, kidney injury.  Patient is awaiting MRI and MRA of head and neck at time of handoff to oncoming ED staff.  She has been given a headache cocktail Compazine and Benadryl.  Please see oncoming ED provider's note for further results, evaluation, disposition of the patient.  This chart was dictated using voice recognition software.  Despite best efforts to proofread,  errors can occur which can change the documentation meaning.         Final Clinical Impression(s) / ED Diagnoses Final diagnoses:  Visual field defect    Rx / DC Orders ED Discharge Orders     None         Lennice Sites, DO 02/06/21 1428

## 2021-02-06 NOTE — Assessment & Plan Note (Signed)
Stable, continue cymbalta and ativan

## 2021-02-06 NOTE — ED Provider Notes (Signed)
°  Physical Exam  BP (!) 162/75 (BP Location: Right Arm)    Pulse 64    Temp 98.6 F (37 C) (Oral)    Resp 18    SpO2 99%     Procedures  Procedures  ED Course / MDM    Medical Decision Making Amount and/or Complexity of Data Reviewed Labs: ordered. Radiology: ordered.  Risk OTC drugs. Prescription drug management. Decision regarding hospitalization.   3F presenting with right upper quadrant visual field loss bilaterally. Last normal yesterday morning when it came on suddenly. Saw optho last week. Normal exam, blurred vision no loss. Endorses headache. On Xarelto for afib.   MRI imaging was significant for an acute medial left occipital infarct.  With associated edema without mass effect.  Discussed with the patient when her last known normal was.  She states that she went to bed 2 nights ago around midnight and was normal and woke up around 8 AM yesterday with symptoms.  325 mg of aspirin administered.  Dr. Theda Sers of on-call neurology paged and will evaluate the patient.  Hospitalist medicine consulted for admission.       Regan Lemming, MD 02/06/21 [phone]
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
## 2021-02-06 NOTE — Assessment & Plan Note (Signed)
Continue cpap at night  
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
## 2021-02-06 NOTE — ED Triage Notes (Signed)
Patient here with complaint of new loss of visual field on top left side of right eye, left sided numbness, and pounding headache that started yesterday. Patient alert, oriented, no dysarthria, no arm drift.

## 2021-02-06 NOTE — Assessment & Plan Note (Addendum)
Restart home meds starting 2/3

## 2021-02-06 NOTE — Assessment & Plan Note (Addendum)
71 year old with right visual field deficits found to have acute medial left occipital infarct.  - Cleared for discharge by neurology, Dr. Leonie Man. Changed to eliquis based on good adherence to xarelto and suspicion for cardioembolic mechanism of cortical stroke.  - Add rosuvastatin 10mg  for LDL 117.  - Further risk factor modification per PCP.

## 2021-02-07 ENCOUNTER — Other Ambulatory Visit: Payer: Self-pay

## 2021-02-07 ENCOUNTER — Other Ambulatory Visit (HOSPITAL_COMMUNITY): Payer: Self-pay

## 2021-02-07 ENCOUNTER — Observation Stay (HOSPITAL_BASED_OUTPATIENT_CLINIC_OR_DEPARTMENT_OTHER): Payer: Medicare Other

## 2021-02-07 DIAGNOSIS — I10 Essential (primary) hypertension: Secondary | ICD-10-CM | POA: Diagnosis not present

## 2021-02-07 DIAGNOSIS — I480 Paroxysmal atrial fibrillation: Secondary | ICD-10-CM | POA: Diagnosis not present

## 2021-02-07 DIAGNOSIS — E785 Hyperlipidemia, unspecified: Secondary | ICD-10-CM

## 2021-02-07 DIAGNOSIS — I6389 Other cerebral infarction: Secondary | ICD-10-CM

## 2021-02-07 DIAGNOSIS — F419 Anxiety disorder, unspecified: Secondary | ICD-10-CM | POA: Diagnosis not present

## 2021-02-07 DIAGNOSIS — I5032 Chronic diastolic (congestive) heart failure: Secondary | ICD-10-CM | POA: Diagnosis not present

## 2021-02-07 DIAGNOSIS — I639 Cerebral infarction, unspecified: Secondary | ICD-10-CM | POA: Diagnosis not present

## 2021-02-07 DIAGNOSIS — F32A Depression, unspecified: Secondary | ICD-10-CM | POA: Diagnosis not present

## 2021-02-07 DIAGNOSIS — G4733 Obstructive sleep apnea (adult) (pediatric): Secondary | ICD-10-CM

## 2021-02-07 DIAGNOSIS — J4540 Moderate persistent asthma, uncomplicated: Secondary | ICD-10-CM | POA: Diagnosis not present

## 2021-02-07 LAB — CBC
HCT: 40.3 % (ref 36.0–46.0)
Hemoglobin: 12.8 g/dL (ref 12.0–15.0)
MCH: 28.8 pg (ref 26.0–34.0)
MCHC: 31.8 g/dL (ref 30.0–36.0)
MCV: 90.8 fL (ref 80.0–100.0)
Platelets: 302 10*3/uL (ref 150–400)
RBC: 4.44 MIL/uL (ref 3.87–5.11)
RDW: 13.7 % (ref 11.5–15.5)
WBC: 9.2 10*3/uL (ref 4.0–10.5)
nRBC: 0 % (ref 0.0–0.2)

## 2021-02-07 LAB — BASIC METABOLIC PANEL
Anion gap: 7 (ref 5–15)
BUN: 10 mg/dL (ref 8–23)
CO2: 25 mmol/L (ref 22–32)
Calcium: 8.8 mg/dL — ABNORMAL LOW (ref 8.9–10.3)
Chloride: 103 mmol/L (ref 98–111)
Creatinine, Ser: 0.7 mg/dL (ref 0.44–1.00)
GFR, Estimated: >60 mL/min (ref >60)
Glucose, Bld: 107 mg/dL — ABNORMAL HIGH (ref 70–99)
Potassium: 3.6 mmol/L (ref 3.5–5.1)
Sodium: 135 mmol/L (ref 135–145)

## 2021-02-07 LAB — ECHOCARDIOGRAM COMPLETE
AR max vel: 2.22 cm2
AV Area VTI: 2.24 cm2
AV Area mean vel: 2.14 cm2
AV Mean grad: 4 mmHg
AV Peak grad: 8.2 mmHg
Ao pk vel: 1.43 m/s
Area-P 1/2: 3.77 cm2
S' Lateral: 4.1 cm

## 2021-02-07 LAB — LIPID PANEL
Cholesterol: 182 mg/dL (ref 0–200)
HDL: 45 mg/dL (ref >40)
LDL Cholesterol: 117 mg/dL — ABNORMAL HIGH (ref 0–99)
Total CHOL/HDL Ratio: 4 RATIO
Triglycerides: 101 mg/dL (ref <150)
VLDL: 20 mg/dL (ref 0–40)

## 2021-02-07 LAB — HIV ANTIBODY (ROUTINE TESTING W REFLEX): HIV Screen 4th Generation wRfx: NONREACTIVE

## 2021-02-07 MED ORDER — ROSUVASTATIN CALCIUM 10 MG PO TABS: 10.0000 mg | ORAL_TABLET | Freq: Every day | ORAL | 0 refills | Status: DC

## 2021-02-07 MED ORDER — ASPIRIN 81 MG PO CHEW: 81.0000 mg | CHEWABLE_TABLET | Freq: Every day | ORAL | Status: DC

## 2021-02-07 MED ORDER — APIXABAN 5 MG PO TABS: 5.0000 mg | ORAL_TABLET | Freq: Two times a day (BID) | ORAL | Status: DC

## 2021-02-07 MED ORDER — APIXABAN 5 MG PO TABS: 5.0000 mg | ORAL_TABLET | Freq: Two times a day (BID) | ORAL | 0 refills | Status: DC

## 2021-02-07 NOTE — Evaluation (Signed)
Occupational Therapy Evaluation Patient Details Name: Shannon [name] MRN: [id number] DOB: 16-Nov-1950 Today's Date: 02/07/2021   History of Present Illness Pt is a 71 yo female presenting 2/1 with visual field deficit and found to have R medial occipital stroke. PMH includes: GERD, OSA on CPAP, HTN, HLD, afib, depression, CAD   Clinical Impression   Pt admitted for concerns listed above. PTA pt reported that she was independent with all ADL's and IADL's including caring for her grandchildren. At this time pt presents with a R superior field cut, affecting both eyes. Overall pt continues to mobilize well and is independent with all ADL's. Recommended for pt to follow up with an eye doctor when she can, otherwise pt has no further OT needs and acute OT will sign off.       Recommendations for follow up therapy are one component of a multi-disciplinary discharge planning process, led by the attending physician.  Recommendations may be updated based on patient status, additional functional criteria and insurance authorization.   Follow Up Recommendations  No OT follow up    Assistance Recommended at Discharge None  Patient can return home with the following      Functional Status Assessment  Patient has had a recent decline in their functional status and demonstrates the ability to make significant improvements in function in a reasonable and predictable amount of time.  Equipment Recommendations  None recommended by OT    Recommendations for Other Services       Precautions / Restrictions Precautions Precautions: None Precaution Comments: small right visual field deficit Restrictions Weight Bearing Restrictions: No      Mobility Bed Mobility Overal bed mobility: Independent                  Transfers Overall transfer level: Modified independent Equipment used: None                      Balance Overall balance assessment: Modified Independent                                          ADL either performed or assessed with clinical judgement   ADL Overall ADL's : Modified independent;At baseline                                       General ADL Comments: Pt able to complete all ADL's with no difficulties, functional mobility is well balanced and safe     Vision Baseline Vision/History: 0 No visual deficits Ability to See in Adequate Light: 0 Adequate Patient Visual Report: Peripheral vision impairment Vision Assessment?: Yes Eye Alignment: Within Functional Limits Ocular Range of Motion: Within Functional Limits Alignment/Gaze Preference: Within Defined Limits Tracking/Visual Pursuits: Able to track stimulus in all quads without difficulty Saccades: Within functional limits Convergence: Within functional limits Visual Fields: Right superior homonymous quadranopsia     Perception     Praxis      Pertinent Vitals/Pain Pain Assessment Pain Assessment: No/denies pain     Hand Dominance Right   Extremity/Trunk Assessment Upper Extremity Assessment Upper Extremity Assessment: Overall WFL for tasks assessed   Lower Extremity Assessment Lower Extremity Assessment: Overall WFL for tasks assessed   Cervical / Trunk Assessment Cervical / Trunk Assessment: Normal   Communication Communication Communication:  No difficulties   Cognition Arousal/Alertness: Awake/alert Behavior During Therapy: WFL for tasks assessed/performed Overall Cognitive Status: Within Functional Limits for tasks assessed                                       General Comments  VSS on RA    Exercises     Shoulder Instructions      Home Living Family/patient expects to be discharged to:: Private residence Living Arrangements: Children Available Help at Discharge: Family;Available 24 hours/day Type of Home: House Home Access: Stairs to enter [employer] of Steps: 3-4 Entrance  Stairs-Rails: Left Home Layout: Two level;Able to live on main level with bedroom/bathroom     Bathroom Shower/Tub: Hospital doctor Toilet: Handicapped height     Home Equipment: Conservation officer, nature (2 wheels);Shower seat      Lives With: Spouse;Daughter    Prior Functioning/Environment Prior Level of Function : Independent/Modified Independent                        OT Problem List: Impaired vision/perception      OT Treatment/Interventions:      OT Goals(Current goals can be found in the care plan section) Acute Rehab OT Goals Patient Stated Goal: To get better OT Goal Formulation: All assessment and education complete, DC therapy Time For Goal Achievement: 02/07/21 Potential to Achieve Goals: Good  OT Frequency:      Co-evaluation              AM-PAC OT "6 Clicks" Daily Activity     Outcome Measure Help from another person eating meals?: None Help from another person taking care of personal grooming?: None Help from another person toileting, which includes using toliet, bedpan, or urinal?: None Help from another person bathing (including washing, rinsing, drying)?: None Help from another person to put on and taking off regular upper body clothing?: None Help from another person to put on and taking off regular lower body clothing?: None 6 Click Score: 24   End of Session Nurse Communication: Mobility status  Activity Tolerance: Patient tolerated treatment well Patient left: Other (comment) (In hall with PT)  OT Visit Diagnosis: Other abnormalities of gait and mobility (R26.89);Muscle weakness (generalized) (M62.81)                Time: 1601-0932 OT Time Calculation (min): 17 min Charges:  OT General Charges $OT Visit: 1 Visit OT Evaluation $OT Eval Moderate Complexity: 1 Mod  Shannon Schiano H., OTR/L Acute Rehabilitation  Shannon [name] 02/07/2021, 4:16 PM

## 2021-02-07 NOTE — Plan of Care (Signed)
°  Problem: Education: Goal: Knowledge of disease or condition will improve Outcome: Progressing Goal: Understanding of medication regimen will improve Outcome: Progressing Goal: Individualized Educational Video(s) Outcome: Progressing   Problem: Cardiac: Goal: Ability to achieve and maintain adequate cardiopulmonary perfusion will improve Outcome: Progressing   Problem: Clinical Measurements: Goal: Ability to maintain clinical measurements within normal limits will improve Outcome: Progressing Goal: Will remain free from infection Outcome: Progressing Goal: Diagnostic test results will improve Outcome: Progressing Goal: Respiratory complications will improve Outcome: Progressing Goal: Cardiovascular complication will be avoided Outcome: Progressing

## 2021-02-07 NOTE — Care Management Obs Status (Signed)
[facility] NOTIFICATION   Patient Details  Name: Elida Harbin MRN: [id number] Date of Birth: Apr 25, 1950   Medicare Observation Status Notification Given:  Yes    Pollie Friar, RN 02/07/2021, 3:39 PM

## 2021-02-07 NOTE — Progress Notes (Signed)
Shannon [name]  D/C'd Home per MD order.  Discussed with the patient and all questions fully answered.  VSS, Skin clean, dry and intact without evidence of skin break down, no evidence of skin tears noted. IV catheter discontinued intact. Site without signs and symptoms of complications. Dressing and pressure applied.  An After Visit Summary was printed and given to the patient. Patient received prescription.  D/c education completed with patien including follow up instructions, medication list, d/c activities limitations if indicated, with other d/c instructions as indicated by MD - patient able to verbalize understanding, all questions fully answered.   Patient instructed to return to ED, call 911, or call MD for any changes in condition.   Patient escorted via [place], and D/C home via private auto.  Dorris Carnes 02/07/2021 4:51 PM

## 2021-02-07 NOTE — Progress Notes (Signed)
Echocardiogram 2D Echocardiogram has been performed.  Arlyss Gandy 02/07/2021, 10:37 AM

## 2021-02-07 NOTE — Evaluation (Signed)
Physical Therapy Evaluation Patient Details Name: Shannon [name] MRN: [id number] DOB: June 11, 1950 Today's Date: 02/07/2021  History of Present Illness  Pt is a 71 yo female presenting 2/1 with visual field deficit and found to have R medial occipital stroke. PMH includes: GERD, OSA on CPAP, HTN, HLD, afib, depression, CAD  Clinical Impression  Pt is at or close to baseline functioning and should be safe at home with PRN assist of husband/family. There are no further acute PT needs.  Will sign off at this time.        Recommendations for follow up therapy are one component of a multi-disciplinary discharge planning process, led by the attending physician.  Recommendations may be updated based on patient status, additional functional criteria and insurance authorization.  Follow Up Recommendations No PT follow up    Assistance Recommended at Discharge PRN  Patient can return home with the following       Equipment Recommendations None recommended by PT  Recommendations for Other Services       Functional Status Assessment Patient has had a recent decline in their functional status and demonstrates the ability to make significant improvements in function in a reasonable and predictable amount of time.     Precautions / Restrictions Precautions Precautions: None Precaution Comments: small right visual field deficit      Mobility  Bed Mobility               General bed mobility comments: OOB on arrival    Transfers Overall transfer level: Modified independent                      Ambulation/Gait Ambulation/Gait assistance: Modified independent (Device/Increase time), Independent Gait Distance (Feet): 500 Feet Assistive device: None Gait Pattern/deviations: WFL(Within Functional Limits)   Gait velocity interpretation: >2.62 ft/sec, indicative of community ambulatory   General Gait Details: steady, slower gait speed than age appropriate, but can modify  speed significantly  Stairs Stairs: Yes Stairs assistance: Modified independent (Device/Increase time) Stair Management: One rail Right, Alternating pattern, Forwards Number of Stairs: 3 General stair comments: safe with the rail  Wheelchair Mobility    Modified Rankin (Stroke Patients Only) Modified Rankin (Stroke Patients Only) Modified Rankin: Slight disability     Balance Overall balance assessment: Modified Independent                               Standardized Balance Assessment Standardized Balance Assessment : Dynamic Gait Index   Dynamic Gait Index Level Surface: Normal Change in Gait Speed: Mild Impairment Gait with Horizontal Head Turns: Mild Impairment Gait with Vertical Head Turns: Normal Gait and Pivot Turn: Normal Step Over Obstacle: Normal Step Around Obstacles: Normal Steps: Mild Impairment Total Score: 21       Pertinent Vitals/Pain Pain Assessment Pain Assessment: No/denies pain    Home Living Family/patient expects to be discharged to:: Private residence Living Arrangements: Children Available Help at Discharge: Family;Available 24 hours/day Type of Home: House                  Prior Function Prior Level of Function : Independent/Modified Independent                     Hand Dominance        Extremity/Trunk Assessment   Upper Extremity Assessment Upper Extremity Assessment: Overall WFL for tasks assessed    Lower Extremity Assessment Lower Extremity  Assessment: Overall WFL for tasks assessed    Cervical / Trunk Assessment Cervical / Trunk Assessment: Normal  Communication   Communication: No difficulties  Cognition Arousal/Alertness: Awake/alert Behavior During Therapy: WFL for tasks assessed/performed Overall Cognitive Status: Within Functional Limits for tasks assessed                                          General Comments General comments (skin integrity, edema, etc.): low  risk of falls    Exercises     Assessment/Plan    PT Assessment Patient does not need any further PT services  PT Problem List         PT Treatment Interventions      PT Goals (Current goals can be found in the Care Plan section)  Acute Rehab PT Goals Patient Stated Goal: home today PT Goal Formulation: All assessment and education complete, DC therapy    Frequency       Co-evaluation               AM-PAC PT "6 Clicks" Mobility  Outcome Measure Help needed turning from your back to your side while in a flat bed without using bedrails?: None Help needed moving from lying on your back to sitting on the side of a flat bed without using bedrails?: None Help needed moving to and from a bed to a chair (including a wheelchair)?: None Help needed standing up from a chair using your arms (e.g., wheelchair or bedside chair)?: None Help needed to walk in hospital room?: None Help needed climbing 3-5 steps with a railing? : None 6 Click Score: 24    End of Session   Activity Tolerance: Patient tolerated treatment well Patient left: in bed;with call bell/phone within reach Nurse Communication: Mobility status      Time: 8119-1478 PT Time Calculation (min) (ACUTE ONLY): 16 min   Charges:   PT Evaluation $PT Eval Low Complexity: 1 Low          02/07/2021  Ginger Carne., PT Acute Rehabilitation Services [phone]  (pager) [phone]  (office)  Shannon [name] 02/07/2021, 2:56 PM
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
## 2021-02-07 NOTE — TOC Benefit Eligibility Note (Signed)
Patient Advocate Encounter ° °Insurance verification completed.   ° °The patient is currently admitted and upon discharge could be taking Eliquis 5 mg. ° °The current 30 day co-pay is, $47.00.  ° °The patient is insured through [employer] Medicare Part D  ° ° ° °Rumaysa Sabatino, CPhT °Pharmacy Patient Advocate Specialist °[facility] Patient Advocate Team °Direct Number: [phone]  Fax: [phone] ° ° ° ° ° °  °

## 2021-02-07 NOTE — Discharge Summary (Signed)
Physician Discharge Summary   Patient: Shannon [name] MRN: [id number] DOB: 1950/11/11  Admit date:     02/06/2021  Discharge date: 02/07/21  Discharge Physician: Patrecia Pour   PCP: Maximiano Coss, NP   Recommendations at discharge:    Follow up with PCP for ongoing management of hyperlipidemia, started rosuvastatin 19m.  Follow up with cardiology routinely for AFib Follow up with neurology for post-stroke care  Discharge Diagnoses: Principal Problem:   Acute CVA (cerebrovascular accident) ([facility] Active Problems:   Essential hypertension   Anxiety and depression   OSA (obstructive sleep apnea)   Chronic diastolic CHF (congestive heart failure) (HCC)   A-fib (HCC)   Asthma   Dyslipidemia, goal LDL below 130  Resolved Problems:   * No resolved hospital problems. *   Hospital Course: DJolleen Semanis a 71y.o. female with medical history significant of atrial fibrillation on xarelto, anxiety and depression, asthma, chronic diastolic CHF, HTN, IDA, OSA, polymyalgia rheumatica who presented to ED with complaints of visual field deficits. She was seen at eye doctor last Thursday and had some fluid behind her left eye. She went into atrial fibrillation on Sunday night and felt fine. She took flecainide and sometime early in the AM on Monday she got really lightheaded and broke out in a sweat. She was in atrial fibrillation for most of day on Monday, took another flecainide, and then converted back into NSR Monday afternoon.  On Tuesday AM, she wok up around 10AM and had a blind spot in her right eye. This lasted all day, but she thought it was getting a little bit better. As the day went on she states she got a bad headache and had a lot of pressure behind her eye, like it was swollen. She went to bed, woke up this AM and her head/eye started to hurt more prompting her to come to ED. She continues to have a blind spot on the right upper side. She denies any slurred speech,  facial drooping or weakness. Thought her gait was "different" yesterday. Has not missed any doses of xarelto.    Fully independent with all ADLs. No assistance with walking.  History of CVA in her father and maternal father.    Overall has been feeling well. Denies any fever/chills, chest pain or palpitations, shortness of breath or cough, abdominal pain, N/v/D, dysuria or leg swelling.      ER Course:   Vitals: afebrile, bp: 164/65, HR: 68, RR: 16, oxygen: 98% RA Pertinent labs: INR: 1.5, uds pending, ua pending  CTH: no acute findings MRI/MRA brain: acute medial left occipital infarct. Associated edema with no mass effect.  Small (1-2 mm) outpouching arising from the inferior aspect of the right supraclinoid ICA, which may represent an aneurysm versus an infundibulum with vessel not well seen by MRA. A follow-up CTA could further characterize if clinically indicated. In ED: code stroke, neurology consulted and [facility] was asked to admit. Patient given [id number].   The patient was admitted for stroke workup which included an unremarkable echocardiogram. Xarelto was switched to eliquis and statin started for hyperlipidemia. Symptoms appear to be somewhat improved at discharge. The patient is eager to return home this morning.  Assessment and Plan: * [place] 89[facility] old with right visual field deficits found to have acute medial left occipital infarct.  - Cleared for discharge by neurology, Dr. SeLeonie ManChanged to eliquis based on good adherence to xarelto and  suspicion for cardioembolic mechanism of cortical stroke.  - Add rosuvastatin 37m for LDL 117.  - Further risk factor modification per PCP.  Dyslipidemia, goal LDL below 130- (present on admission) Goal LDL <70 in setting of acute CVA Starting crestor 148mdaily   Asthma- (present on admission) Stable, no signs of exacerbation Continue albuterol prn   A-fib ([facility] (present  on admission) Hx ablation x2, followed by Dr. AlRayann HemanCurrently back in NSR. Continue coreg, prn flecainide  Chronic diastolic CHF (congestive heart failure) ([facility] ([facility] [place] [facility]) No signs of exacerbation, euvolemic Last echo: 3/21 EF of 60-65% with indeterminate diastolic parameters. Normal LVF.   OSA (obstructive sleep apnea)- (present on admission) Continue cpap at night   Anxiety and depression- (present on admission) Stable, continue cymbalta and ativan   Essential hypertension- (present on admission) Restart home meds starting 2/3   Consultants: Neurology Procedures performed: None  Disposition: Home Diet recommendation:  Discharge Diet Orders (From admission, onward)     Start     Ordered   02/07/21 0000  Diet - low sodium heart healthy        02/07/21 1549           Cardiac diet  DISCHARGE MEDICATION: Allergies as of 02/07/2021       Reactions   Ace Inhibitors Cough   Elemental Sulfur Nausea And Vomiting   Hctz [hydrochlorothiazide] Other (See Comments)   Caused drug-induced LUPUS   Oxycodone Other (See Comments)   Hallucinations   Prednisone Other (See Comments)   Made patient very aggressive   Sulfa Antibiotics Nausea And Vomiting   Voltaren [diclofenac Sodium] Other (See Comments)   Made patient become aggressive        Medication List     STOP taking these medications    traZODone 50 MG tablet Commonly known as: DESYREL   Xarelto 20 MG Tabs tablet Generic drug: rivaroxaban       TAKE these medications    acetaminophen 650 MG CR tablet Commonly known as: TYLENOL Take 650-1,300 mg by mouth every 8 (eight) hours as needed for pain.   albuterol 108 (90 Base) MCG/ACT inhaler Commonly known as: VENTOLIN HFA Inhale 1-2 puffs into the lungs every 6 (six) hours as needed for wheezing or shortness of breath.   amLODipine 5 MG tablet Commonly known as: NORVASC Take 1 tablet (5 mg total) by mouth daily.   apixaban 5 MG Tabs  tablet Commonly known as: ELIQUIS Take 1 tablet (5 mg total) by mouth 2 (two) times daily.   carvedilol 6.25 MG tablet Commonly known as: COREG TAKE 1 TABLET BY MOUTH IN  THE MORNING AND 2 TABLETS  BY MOUTH IN THE EVENING What changed: See the new instructions.   [id number] Apply 1 application topically as needed (Dry Skin).   DRY EYE RELIEF OP Apply 1 drop to eye as needed (Dryness).   DULoxetine 30 MG capsule Commonly known as: Cymbalta Take 1 capsule (30 mg total) by mouth daily.   flecainide 50 MG tablet Commonly known as: TAMBOCOR Take 1 tablet (50 mg total) by mouth as needed. What changed: reasons to take this   loratadine 10 MG tablet Commonly known as: CLARITIN Take 10 mg by mouth at bedtime.   LORazepam 0.5 MG tablet Commonly known as: ATIVAN Take 1 tablet (0.5 mg total) by mouth every 8 (eight) hours as needed for anxiety.   Na Sulfate-K Sulfate-Mg Sulf 17.5-3.13-1.6 GM/177ML Soln Commonly known as: Suprep Bowel Prep Kit Take 1 kit  by mouth as directed. For colonoscopy prep   rosuvastatin 10 MG tablet Commonly known as: CRESTOR Take 1 tablet (10 mg total) by mouth daily. Start taking on: February 08, 2021   triamcinolone 55 MCG/ACT Aero nasal inhaler Commonly known as: NASACORT Place 2 sprays into the nose daily.   Vitamin D 50 MCG (2000 UT) Caps Take 2,000 Units by mouth daily.        Follow-up Information     Maximiano Coss, NP Follow up.   Specialty: Adult Health Nurse Practitioner Contact information: [address] [place] [address] [phone]         Garvin Fila, MD Follow up.   Specialties: Neurology, Radiology Contact information: [address] [place] [address] [phone]                 Discharge Exam: BP (!) 140/57 (BP Location: Right Arm)    Pulse 70    Temp 98.6 F (37 C) (Oral)    Resp 16    SpO2 96%    Discrete Right Upper visual field cut in both eyes. No other focal  deficits. Pleasant and in no distress getting echo performed.   Condition at discharge: good  The results of significant diagnostics from this hospitalization (including imaging, microbiology, ancillary and laboratory) are listed below for reference.   Imaging Studies: CT HEAD WO CONTRAST  Result Date: 02/06/2021 CLINICAL DATA:  Visual field deficits in the right eye, headaches, left-sided numbness EXAM: CT HEAD WITHOUT CONTRAST TECHNIQUE: Contiguous axial images were obtained from the base of the skull through the vertex without intravenous contrast. RADIATION DOSE REDUCTION: This exam was performed according to the departmental dose-optimization program which includes automated exposure control, adjustment of the mA and/or kV according to patient size and/or use of iterative reconstruction technique. COMPARISON:  None. FINDINGS: Brain: No acute intracranial findings are seen. There are no signs of bleeding. Ventricles are not dilated. There is no focal edema or mass effect. Cortical sulci are prominent. Vascular: Unremarkable. Skull: Unremarkable. Sinuses/Orbits: Unremarkable. Other: None IMPRESSION: No acute intracranial findings are seen in noncontrast CT brain. Electronically Signed   By: Elmer Picker M.D.   On: 02/06/2021 13:36   MR ANGIO HEAD WO CONTRAST  Result Date: 02/06/2021 CLINICAL DATA:  Neuro deficit, acute, stroke suspected Vision loss, binocular right upper visual field lost EXAM: MRI HEAD WITHOUT CONTRAST MRA HEAD WITHOUT CONTRAST MRA NECK WITHOUT CONTRAST TECHNIQUE: Multiplanar, multiecho pulse sequences of the brain and surrounding structures were obtained without intravenous contrast. Angiographic images of the Circle of Willis were obtained using MRA technique without intravenous contrast. Angiographic images of the neck were obtained using MRA technique without intravenous contrast. Carotid stenosis measurements (when applicable) are obtained utilizing NASCET criteria, using  the distal internal carotid diameter as the denominator. COMPARISON:  Same day CT head. FINDINGS: MRI HEAD FINDINGS Brain: Acute medial left occipital infarct. Associated edema without mass effect. No hydrocephalus. No mass lesion, midline shift, extra-axial fluid collection, or acute hemorrhage. Patchy white matter T2/FLAIR hyperintensities, nonspecific but compatible with chronic microvascular ischemic disease. Vascular: See below. Skull and upper cervical spine: Normal marrow signal. Sinuses/Orbits: Mild paranasal sinus mucosal thickening. Unremarkable orbits. Other: No mastoid effusions. MRA HEAD FINDINGS Anterior circulation: Bilateral intracranial ICAs, MCAs, and ACAs are patent without proximal hemodynamically significant stenosis. Small (1-2 mm) outpouching arising from the inferior aspect of the right supraclinoid ICA (series 4, image 72; series 1404, image 175)) Posterior circulation: Bilateral intradural vertebral  arteries, basilar artery and posterior cerebral arteries are patent without proximal hemodynamically significant stenosis. No aneurysm identified. MRA NECK FINDINGS Right carotid system: Limited evaluation proximally without evidence of visible significant (greater than 50%) stenosis. Left carotid system: Limited evaluation proximally without evidence of visible significant (greater than 50%) stenosis. Vertebral arteries: Limited evaluation proximally without evidence of visible significant (greater than 50%) stenosis. Tortuous left vertebral artery IMPRESSION: MRI: Acute medial left occipital infarct. Associated edema without mass effect. MRA head: 1. No large vessel occlusion or proximal hemodynamically significant stenosis. 2. Small (1-2 mm) outpouching arising from the inferior aspect of the right supraclinoid ICA, which may represent an aneurysm versus an infundibulum with vessel not well seen by MRA. A follow-up CTA could further characterize if clinically indicated. MRA neck: Limited  study proximally without evidence of significant (greater than 50%) stenosis. Electronically Signed   By: Margaretha Sheffield M.D.   On: 02/06/2021 16:24   MR ANGIO NECK WO CONTRAST  Result Date: 02/06/2021 CLINICAL DATA:  Neuro deficit, acute, stroke suspected Vision loss, binocular right upper visual field lost EXAM: MRI HEAD WITHOUT CONTRAST MRA HEAD WITHOUT CONTRAST MRA NECK WITHOUT CONTRAST TECHNIQUE: Multiplanar, multiecho pulse sequences of the brain and surrounding structures were obtained without intravenous contrast. Angiographic images of the Circle of Willis were obtained using MRA technique without intravenous contrast. Angiographic images of the neck were obtained using MRA technique without intravenous contrast. Carotid stenosis measurements (when applicable) are obtained utilizing NASCET criteria, using the distal internal carotid diameter as the denominator. COMPARISON:  Same day CT head. FINDINGS: MRI HEAD FINDINGS Brain: Acute medial left occipital infarct. Associated edema without mass effect. No hydrocephalus. No mass lesion, midline shift, extra-axial fluid collection, or acute hemorrhage. Patchy white matter T2/FLAIR hyperintensities, nonspecific but compatible with chronic microvascular ischemic disease. Vascular: See below. Skull and upper cervical spine: Normal marrow signal. Sinuses/Orbits: Mild paranasal sinus mucosal thickening. Unremarkable orbits. Other: No mastoid effusions. MRA HEAD FINDINGS Anterior circulation: Bilateral intracranial ICAs, MCAs, and ACAs are patent without proximal hemodynamically significant stenosis. Small (1-2 mm) outpouching arising from the inferior aspect of the right supraclinoid ICA (series 4, image 72; series 1404, image 175)) Posterior circulation: Bilateral intradural vertebral arteries, basilar artery and posterior cerebral arteries are patent without proximal hemodynamically significant stenosis. No aneurysm identified. MRA NECK FINDINGS Right carotid  system: Limited evaluation proximally without evidence of visible significant (greater than 50%) stenosis. Left carotid system: Limited evaluation proximally without evidence of visible significant (greater than 50%) stenosis. Vertebral arteries: Limited evaluation proximally without evidence of visible significant (greater than 50%) stenosis. Tortuous left vertebral artery IMPRESSION: MRI: Acute medial left occipital infarct. Associated edema without mass effect. MRA head: 1. No large vessel occlusion or proximal hemodynamically significant stenosis. 2. Small (1-2 mm) outpouching arising from the inferior aspect of the right supraclinoid ICA, which may represent an aneurysm versus an infundibulum with vessel not well seen by MRA. A follow-up CTA could further characterize if clinically indicated. MRA neck: Limited study proximally without evidence of significant (greater than 50%) stenosis. Electronically Signed   By: Margaretha Sheffield M.D.   On: 02/06/2021 16:24   MR BRAIN WO CONTRAST  Result Date: 02/06/2021 CLINICAL DATA:  Neuro deficit, acute, stroke suspected Vision loss, binocular right upper visual field lost EXAM: MRI HEAD WITHOUT CONTRAST MRA HEAD WITHOUT CONTRAST MRA NECK WITHOUT CONTRAST TECHNIQUE: Multiplanar, multiecho pulse sequences of the brain and surrounding structures were obtained without intravenous contrast. Angiographic images of the Circle of Willis were obtained  using MRA technique without intravenous contrast. Angiographic images of the neck were obtained using MRA technique without intravenous contrast. Carotid stenosis measurements (when applicable) are obtained utilizing NASCET criteria, using the distal internal carotid diameter as the denominator. COMPARISON:  Same day CT head. FINDINGS: MRI HEAD FINDINGS Brain: Acute medial left occipital infarct. Associated edema without mass effect. No hydrocephalus. No mass lesion, midline shift, extra-axial fluid collection, or acute  hemorrhage. Patchy white matter T2/FLAIR hyperintensities, nonspecific but compatible with chronic microvascular ischemic disease. Vascular: See below. Skull and upper cervical spine: Normal marrow signal. Sinuses/Orbits: Mild paranasal sinus mucosal thickening. Unremarkable orbits. Other: No mastoid effusions. MRA HEAD FINDINGS Anterior circulation: Bilateral intracranial ICAs, MCAs, and ACAs are patent without proximal hemodynamically significant stenosis. Small (1-2 mm) outpouching arising from the inferior aspect of the right supraclinoid ICA (series 4, image 72; series 1404, image 175)) Posterior circulation: Bilateral intradural vertebral arteries, basilar artery and posterior cerebral arteries are patent without proximal hemodynamically significant stenosis. No aneurysm identified. MRA NECK FINDINGS Right carotid system: Limited evaluation proximally without evidence of visible significant (greater than 50%) stenosis. Left carotid system: Limited evaluation proximally without evidence of visible significant (greater than 50%) stenosis. Vertebral arteries: Limited evaluation proximally without evidence of visible significant (greater than 50%) stenosis. Tortuous left vertebral artery IMPRESSION: MRI: Acute medial left occipital infarct. Associated edema without mass effect. MRA head: 1. No large vessel occlusion or proximal hemodynamically significant stenosis. 2. Small (1-2 mm) outpouching arising from the inferior aspect of the right supraclinoid ICA, which may represent an aneurysm versus an infundibulum with vessel not well seen by MRA. A follow-up CTA could further characterize if clinically indicated. MRA neck: Limited study proximally without evidence of significant (greater than 50%) stenosis. Electronically Signed   By: Margaretha Sheffield M.D.   On: 02/06/2021 16:24   ECHOCARDIOGRAM COMPLETE  Result Date: 02/07/2021    ECHOCARDIOGRAM REPORT   Patient Name:   [name] Date of Exam:  02/07/2021 Medical Rec #:  [id number]             Height:       67.0 in Accession #:    [id number]            Weight:       223.0 lb Date of Birth:  [birth year]              BSA:          2.118 m Patient Age:    71 years              BP:           159/70 mmHg Patient Gender: F                     HR:           72 bpm. Exam Location:  Inpatient Procedure: 2D Echo Indications:    Stroke  History:        Patient has prior history of Echocardiogram examinations, most                 recent 03/11/2019. CAD, Arrythmias:Atrial Fibrillation; Risk                 Factors:Dyslipidemia and Hypertension.  Sonographer:    Arlyss Gandy Referring Phys: [id number] [place]  1. Left ventricular ejection fraction, by estimation, is 55 to 60%. The left ventricle has normal function. The left ventricle has no regional wall motion abnormalities.  The left ventricular internal cavity size was mildly dilated. Left ventricular diastolic parameters were normal.  2. Right ventricular systolic function is normal. The right ventricular size is normal. There is normal pulmonary artery systolic pressure.  3. The mitral valve is normal in structure. Mild mitral valve regurgitation.  4. The aortic valve is normal in structure. Aortic valve regurgitation is not visualized.  5. The inferior vena cava is normal in size with greater than 50% respiratory variability, suggesting right atrial pressure of 3 mmHg. FINDINGS  Left Ventricle: Left ventricular ejection fraction, by estimation, is 55 to 60%. The left ventricle has normal function. The left ventricle has no regional wall motion abnormalities. The left ventricular internal cavity size was mildly dilated. There is  no left ventricular hypertrophy. Left ventricular diastolic parameters were normal. Right Ventricle: The right ventricular size is normal. Right vetricular wall thickness was not assessed. Right ventricular systolic function is normal. There is normal pulmonary artery systolic  pressure. The tricuspid regurgitant velocity is 2.62 m/s, and with an assumed right atrial pressure of 3 mmHg, the estimated right ventricular systolic pressure is 32.4 mmHg. Left Atrium: Left atrial size was normal in size. Right Atrium: Right atrial size was normal in size. Pericardium: There is no evidence of pericardial effusion. Mitral Valve: The mitral valve is normal in structure. Mild mitral valve regurgitation. Tricuspid Valve: The tricuspid valve is normal in structure. Tricuspid valve regurgitation is mild. Aortic Valve: The aortic valve is normal in structure. Aortic valve regurgitation is not visualized. Aortic valve mean gradient measures 4.0 mmHg. Aortic valve peak gradient measures 8.2 mmHg. Aortic valve area, by VTI measures 2.24 cm. Pulmonic Valve: The pulmonic valve was not well visualized. Pulmonic valve regurgitation is not visualized. Aorta: The aortic root and ascending aorta are structurally normal, with no evidence of dilitation. Venous: The inferior vena cava is normal in size with greater than 50% respiratory variability, suggesting right atrial pressure of 3 mmHg. IAS/Shunts: The interatrial septum was not well visualized.  LEFT VENTRICLE PLAX 2D LVIDd:         5.30 cm   Diastology LVIDs:         4.10 cm   LV e' medial:    8.59 cm/s LV PW:         1.00 cm   LV E/e' medial:  10.7 LV IVS:        1.00 cm   LV e' lateral:   10.20 cm/s LVOT diam:     2.00 cm   LV E/e' lateral: 9.0 LV SV:         67 LV SV Index:   31 LVOT Area:     3.14 cm  RIGHT VENTRICLE            IVC RV Basal diam:  3.80 cm    IVC diam: 1.50 cm RV Mid diam:    2.70 cm RV S prime:     6.96 cm/s TAPSE (M-mode): 2.1 cm LEFT ATRIUM             Index        RIGHT ATRIUM           Index LA diam:        4.80 cm 2.27 cm/m   RA Area:     22.70 cm LA Vol (A2C):   87.1 ml 41.12 ml/m  RA Volume:   68.10 ml  32.15 ml/m LA Vol (A4C):   55.2 ml 26.06 ml/m LA Biplane Vol: 72.6  ml 34.28 ml/m  AORTIC VALVE AV Area (Vmax):    2.22 cm  AV Area (Vmean):   2.14 cm AV Area (VTI):     2.24 cm AV Vmax:           143.00 cm/s AV Vmean:          96.400 cm/s AV VTI:            0.297 m AV Peak Grad:      8.2 mmHg AV Mean Grad:      4.0 mmHg LVOT Vmax:         101.00 cm/s LVOT Vmean:        65.600 cm/s LVOT VTI:          0.212 m LVOT/AV VTI ratio: 0.71  AORTA Ao Root diam: 3.00 cm Ao Asc diam:  3.40 cm MITRAL VALVE               TRICUSPID VALVE MV Area (PHT): 3.77 cm    TR Peak grad:   27.5 mmHg MV Decel Time: 201 msec    TR Vmax:        262.00 cm/s MV E velocity: 91.70 cm/s MV A velocity: 46.30 cm/s  SHUNTS MV E/A ratio:  1.98        Systemic VTI:  0.21 m                            Systemic Diam: 2.00 cm Dorris Carnes MD Electronically signed by Dorris Carnes MD Signature Date/Time: 02/07/2021/12:21:10 PM    Final    CUP PACEART REMOTE DEVICE CHECK  Result Date: 01/23/2021 [name] summary report received. Battery status OK. Normal device function. No new  tachy, brady, or pause episodes. 12 new AF episodes poor rate control.  3 symptoms during AF with RVR, pt. reports taking Flecainide.  Burden 3.2%, Coreg, Xarelto prescribed.  Longest episode 10hrs. Pt. has been instructed to call clinic prn symptoms.  Monthly summary reports and ROV/PRN LA   Microbiology: Results for orders placed or performed during the hospital encounter of 02/06/21  Resp Panel by RT-PCR (Flu A&B, Covid) Nasopharyngeal Swab     Status: None   Collection Time: 02/06/21 12:13 PM   Specimen: Nasopharyngeal Swab; Nasopharyngeal(NP) swabs in vial transport medium  Result Value Ref Range Status   SARS Coronavirus 2 by RT PCR NEGATIVE NEGATIVE Final    Comment: (NOTE) SARS-CoV-2 target nucleic acids are NOT DETECTED.  The SARS-CoV-2 RNA is generally detectable in upper respiratory specimens during the acute phase of infection. The lowest concentration of SARS-CoV-2 viral copies this assay can detect is 138 copies/mL. A negative result does not preclude SARS-Cov-2 infection and  should not be used as the sole basis for treatment or other patient management decisions. A negative result may occur with  improper specimen collection/handling, submission of specimen other than nasopharyngeal swab, presence of viral mutation(s) within the areas targeted by this assay, and inadequate number of viral copies(<138 copies/mL). A negative result must be combined with clinical observations, patient history, and epidemiological information. The expected result is Negative.  Fact Sheet for Patients:  EntrepreneurPulse.com.au  Fact Sheet for Healthcare Providers:  IncredibleEmployment.be  This test is no t yet approved or cleared by the [place] FDA and  has been authorized for detection and/or diagnosis of SARS-CoV-2 by FDA under an Emergency Use Authorization (EUA). This EUA will remain  in effect (meaning this test can be used) for  the duration of the COVID-19 declaration under Section 564(b)(1) of the Act, 21 U.S.C.section 360bbb-3(b)(1), unless the authorization is terminated  or revoked sooner.       Influenza A by PCR NEGATIVE NEGATIVE Final   Influenza B by PCR NEGATIVE NEGATIVE Final    Comment: (NOTE) The Xpert Xpress SARS-CoV-2/FLU/RSV plus assay is intended as an aid in the diagnosis of influenza from Nasopharyngeal swab specimens and should not be used as a sole basis for treatment. Nasal washings and aspirates are unacceptable for Xpert Xpress SARS-CoV-2/FLU/RSV testing.  Fact Sheet for Patients: EntrepreneurPulse.com.au  Fact Sheet for Healthcare Providers: IncredibleEmployment.be  This test is not yet approved or cleared by the [place] FDA and has been authorized for detection and/or diagnosis of SARS-CoV-2 by FDA under an Emergency Use Authorization (EUA). This EUA will remain in effect (meaning this test can be used) for the duration of the COVID-19 declaration  under Section 564(b)(1) of the Act, 21 U.S.C. section 360bbb-3(b)(1), unless the authorization is terminated or revoked.  Performed at [facility], [place] [address] [place], [place] [address]     Labs: CBC: Recent Labs  Lab 02/06/21 1306 02/07/21 0340  WBC 6.8 9.2  NEUTROABS 4.5  --   HGB 13.7 12.8  HCT 42.8 40.3  MCV 91.6 90.8  PLT 342 808   Basic Metabolic Panel: Recent Labs  Lab 02/06/21 1306 02/07/21 0340  NA 141 135  K 4.3 3.6  CL 102 103  CO2 29 25  GLUCOSE 89 107*  BUN 12 10  CREATININE 0.92 0.70  CALCIUM 9.9 8.8*   Liver Function Tests: Recent Labs  Lab 02/06/21 1306  AST 20  ALT 19  ALKPHOS 77  BILITOT 0.6  PROT 7.5  ALBUMIN 3.9   CBG: No results for input(s): GLUCAP in the last 168 hours.  Discharge time spent: greater than 30 minutes.  Signed: Patrecia Pour, MD [facility] 02/07/2021

## 2021-02-07 NOTE — Progress Notes (Addendum)
STROKE TEAM PROGRESS NOTE   SUBJECTIVE (INTERVAL HISTORY) Her husband is at the bedside.  Overall her condition is stable. Patient reports that she has a history of A Fib and has been compliant with her Xarelto, although she takes at 10 PM on an empty stomach. Previously took Eliquis but d/c d/t inability to afford before current PBM.  Sunday night (1/29), she awoke in the middle of the night with sx of A Fib inc cold sweat and palpitations. She went back to sleep, operated as normally on Monday with intermittent palpitations, then awoke Tuesday with R sided peripheral vision loss and HA with pressure behind her eyes. Today, she continues to endorse the vision loss but no other deficits. CT unremarkable, and MRI with acute medial L occipital infarct.   OBJECTIVE Temp:  [98.4 F (36.9 C)-99 F (37.2 C)] 98.6 F (37 C) (02/02 1135) Pulse Rate:  [61-75] 70 (02/02 1135) Cardiac Rhythm: Normal sinus rhythm (02/02 0700) Resp:  [15-36] 16 (02/02 1135) BP: (108-175)/(49-94) 140/57 (02/02 1135) SpO2:  [94 %-100 %] 96 % (02/02 1135)  No results for input(s): GLUCAP in the last 168 hours. Recent Labs  Lab 02/06/21 1306 02/07/21 0340  NA 141 135  K 4.3 3.6  CL 102 103  CO2 29 25  GLUCOSE 89 107*  BUN 12 10  CREATININE 0.92 0.70  CALCIUM 9.9 8.8*   Recent Labs  Lab 02/06/21 1306  AST 20  ALT 19  ALKPHOS 77  BILITOT 0.6  PROT 7.5  ALBUMIN 3.9   Recent Labs  Lab 02/06/21 1306 02/07/21 0340  WBC 6.8 9.2  NEUTROABS 4.5  --   HGB 13.7 12.8  HCT 42.8 40.3  MCV 91.6 90.8  PLT 342 302   No results for input(s): CKTOTAL, CKMB, CKMBINDEX, TROPONINI in the last 168 hours. Recent Labs    02/06/21 1306  LABPROT 17.9*  INR 1.5*   Recent Labs    02/06/21 1213  COLORURINE YELLOW  LABSPEC <1.005*  PHURINE 6.5  GLUCOSEU NEGATIVE  HGBUR NEGATIVE  BILIRUBINUR NEGATIVE  KETONESUR NEGATIVE  PROTEINUR NEGATIVE  NITRITE NEGATIVE  LEUKOCYTESUR NEGATIVE       Component Value  Date/Time   CHOL 182 02/07/2021 0340   TRIG 101 02/07/2021 0340   HDL 45 02/07/2021 0340   CHOLHDL 4.0 02/07/2021 0340   VLDL 20 02/07/2021 0340   LDLCALC 117 (H) 02/07/2021 0340   Lab Results  Component Value Date   HGBA1C 5.7 01/03/2021      Component Value Date/Time   LABOPIA NONE DETECTED 02/06/2021 1213   COCAINSCRNUR NONE DETECTED 02/06/2021 1213   LABBENZ NONE DETECTED 02/06/2021 1213   AMPHETMU NONE DETECTED 02/06/2021 1213   THCU NONE DETECTED 02/06/2021 1213   LABBARB NONE DETECTED 02/06/2021 1213    No results for input(s): ETH in the last 168 hours.  I have personally reviewed the radiological images below and agree with the radiology interpretations.  CT HEAD WO CONTRAST  Result Date: 02/06/2021 CLINICAL DATA:  Visual field deficits in the right eye, headaches, left-sided numbness EXAM: CT HEAD WITHOUT CONTRAST TECHNIQUE: Contiguous axial images were obtained from the base of the skull through the vertex without intravenous contrast. RADIATION DOSE REDUCTION: This exam was performed according to the departmental dose-optimization program which includes automated exposure control, adjustment of the mA and/or kV according to patient size and/or use of iterative reconstruction technique. COMPARISON:  None. FINDINGS: Brain: No acute intracranial findings are seen. There are no signs of bleeding. Ventricles  are not dilated. There is no focal edema or mass effect. Cortical sulci are prominent. Vascular: Unremarkable. Skull: Unremarkable. Sinuses/Orbits: Unremarkable. Other: None IMPRESSION: No acute intracranial findings are seen in noncontrast CT brain. Electronically Signed   By: Elmer Picker M.D.   On: 02/06/2021 13:36   MR ANGIO HEAD WO CONTRAST  Result Date: 02/06/2021 CLINICAL DATA:  Neuro deficit, acute, stroke suspected Vision loss, binocular right upper visual field lost EXAM: MRI HEAD WITHOUT CONTRAST MRA HEAD WITHOUT CONTRAST MRA NECK WITHOUT CONTRAST TECHNIQUE:  Multiplanar, multiecho pulse sequences of the brain and surrounding structures were obtained without intravenous contrast. Angiographic images of the Circle of Willis were obtained using MRA technique without intravenous contrast. Angiographic images of the neck were obtained using MRA technique without intravenous contrast. Carotid stenosis measurements (when applicable) are obtained utilizing NASCET criteria, using the distal internal carotid diameter as the denominator. COMPARISON:  Same day CT head. FINDINGS: MRI HEAD FINDINGS Brain: Acute medial left occipital infarct. Associated edema without mass effect. No hydrocephalus. No mass lesion, midline shift, extra-axial fluid collection, or acute hemorrhage. Patchy white matter T2/FLAIR hyperintensities, nonspecific but compatible with chronic microvascular ischemic disease. Vascular: See below. Skull and upper cervical spine: Normal marrow signal. Sinuses/Orbits: Mild paranasal sinus mucosal thickening. Unremarkable orbits. Other: No mastoid effusions. MRA HEAD FINDINGS Anterior circulation: Bilateral intracranial ICAs, MCAs, and ACAs are patent without proximal hemodynamically significant stenosis. Small (1-2 mm) outpouching arising from the inferior aspect of the right supraclinoid ICA (series 4, image 72; series 1404, image 175)) Posterior circulation: Bilateral intradural vertebral arteries, basilar artery and posterior cerebral arteries are patent without proximal hemodynamically significant stenosis. No aneurysm identified. MRA NECK FINDINGS Right carotid system: Limited evaluation proximally without evidence of visible significant (greater than 50%) stenosis. Left carotid system: Limited evaluation proximally without evidence of visible significant (greater than 50%) stenosis. Vertebral arteries: Limited evaluation proximally without evidence of visible significant (greater than 50%) stenosis. Tortuous left vertebral artery IMPRESSION: MRI: Acute medial  left occipital infarct. Associated edema without mass effect. MRA head: 1. No large vessel occlusion or proximal hemodynamically significant stenosis. 2. Small (1-2 mm) outpouching arising from the inferior aspect of the right supraclinoid ICA, which may represent an aneurysm versus an infundibulum with vessel not well seen by MRA. A follow-up CTA could further characterize if clinically indicated. MRA neck: Limited study proximally without evidence of significant (greater than 50%) stenosis. Electronically Signed   By: Margaretha Sheffield M.D.   On: 02/06/2021 16:24   MR ANGIO NECK WO CONTRAST  Result Date: 02/06/2021 CLINICAL DATA:  Neuro deficit, acute, stroke suspected Vision loss, binocular right upper visual field lost EXAM: MRI HEAD WITHOUT CONTRAST MRA HEAD WITHOUT CONTRAST MRA NECK WITHOUT CONTRAST TECHNIQUE: Multiplanar, multiecho pulse sequences of the brain and surrounding structures were obtained without intravenous contrast. Angiographic images of the Circle of Willis were obtained using MRA technique without intravenous contrast. Angiographic images of the neck were obtained using MRA technique without intravenous contrast. Carotid stenosis measurements (when applicable) are obtained utilizing NASCET criteria, using the distal internal carotid diameter as the denominator. COMPARISON:  Same day CT head. FINDINGS: MRI HEAD FINDINGS Brain: Acute medial left occipital infarct. Associated edema without mass effect. No hydrocephalus. No mass lesion, midline shift, extra-axial fluid collection, or acute hemorrhage. Patchy white matter T2/FLAIR hyperintensities, nonspecific but compatible with chronic microvascular ischemic disease. Vascular: See below. Skull and upper cervical spine: Normal marrow signal. Sinuses/Orbits: Mild paranasal sinus mucosal thickening. Unremarkable orbits. Other: No mastoid effusions. MRA  HEAD FINDINGS Anterior circulation: Bilateral intracranial ICAs, MCAs, and ACAs are patent  without proximal hemodynamically significant stenosis. Small (1-2 mm) outpouching arising from the inferior aspect of the right supraclinoid ICA (series 4, image 72; series 1404, image 175)) Posterior circulation: Bilateral intradural vertebral arteries, basilar artery and posterior cerebral arteries are patent without proximal hemodynamically significant stenosis. No aneurysm identified. MRA NECK FINDINGS Right carotid system: Limited evaluation proximally without evidence of visible significant (greater than 50%) stenosis. Left carotid system: Limited evaluation proximally without evidence of visible significant (greater than 50%) stenosis. Vertebral arteries: Limited evaluation proximally without evidence of visible significant (greater than 50%) stenosis. Tortuous left vertebral artery IMPRESSION: MRI: Acute medial left occipital infarct. Associated edema without mass effect. MRA head: 1. No large vessel occlusion or proximal hemodynamically significant stenosis. 2. Small (1-2 mm) outpouching arising from the inferior aspect of the right supraclinoid ICA, which may represent an aneurysm versus an infundibulum with vessel not well seen by MRA. A follow-up CTA could further characterize if clinically indicated. MRA neck: Limited study proximally without evidence of significant (greater than 50%) stenosis. Electronically Signed   By: Margaretha Sheffield M.D.   On: 02/06/2021 16:24   MR BRAIN WO CONTRAST  Result Date: 02/06/2021 CLINICAL DATA:  Neuro deficit, acute, stroke suspected Vision loss, binocular right upper visual field lost EXAM: MRI HEAD WITHOUT CONTRAST MRA HEAD WITHOUT CONTRAST MRA NECK WITHOUT CONTRAST TECHNIQUE: Multiplanar, multiecho pulse sequences of the brain and surrounding structures were obtained without intravenous contrast. Angiographic images of the Circle of Willis were obtained using MRA technique without intravenous contrast. Angiographic images of the neck were obtained using MRA  technique without intravenous contrast. Carotid stenosis measurements (when applicable) are obtained utilizing NASCET criteria, using the distal internal carotid diameter as the denominator. COMPARISON:  Same day CT head. FINDINGS: MRI HEAD FINDINGS Brain: Acute medial left occipital infarct. Associated edema without mass effect. No hydrocephalus. No mass lesion, midline shift, extra-axial fluid collection, or acute hemorrhage. Patchy white matter T2/FLAIR hyperintensities, nonspecific but compatible with chronic microvascular ischemic disease. Vascular: See below. Skull and upper cervical spine: Normal marrow signal. Sinuses/Orbits: Mild paranasal sinus mucosal thickening. Unremarkable orbits. Other: No mastoid effusions. MRA HEAD FINDINGS Anterior circulation: Bilateral intracranial ICAs, MCAs, and ACAs are patent without proximal hemodynamically significant stenosis. Small (1-2 mm) outpouching arising from the inferior aspect of the right supraclinoid ICA (series 4, image 72; series 1404, image 175)) Posterior circulation: Bilateral intradural vertebral arteries, basilar artery and posterior cerebral arteries are patent without proximal hemodynamically significant stenosis. No aneurysm identified. MRA NECK FINDINGS Right carotid system: Limited evaluation proximally without evidence of visible significant (greater than 50%) stenosis. Left carotid system: Limited evaluation proximally without evidence of visible significant (greater than 50%) stenosis. Vertebral arteries: Limited evaluation proximally without evidence of visible significant (greater than 50%) stenosis. Tortuous left vertebral artery IMPRESSION: MRI: Acute medial left occipital infarct. Associated edema without mass effect. MRA head: 1. No large vessel occlusion or proximal hemodynamically significant stenosis. 2. Small (1-2 mm) outpouching arising from the inferior aspect of the right supraclinoid ICA, which may represent an aneurysm versus an  infundibulum with vessel not well seen by MRA. A follow-up CTA could further characterize if clinically indicated. MRA neck: Limited study proximally without evidence of significant (greater than 50%) stenosis. Electronically Signed   By: Margaretha Sheffield M.D.   On: 02/06/2021 16:24   ECHOCARDIOGRAM COMPLETE  Result Date: 02/07/2021    ECHOCARDIOGRAM REPORT   Patient Name:   [facility]  Wilhelmina [name] Date of Exam: 02/07/2021 Medical Rec #:  [id number]             Height:       67.0 in Accession #:    [id number]            Weight:       223.0 lb Date of Birth:  July 27, 1950              BSA:          2.118 m Patient Age:    89 years              BP:           159/70 mmHg Patient Gender: F                     HR:           72 bpm. Exam Location:  Inpatient Procedure: 2D Echo Indications:    Stroke  History:        Patient has prior history of Echocardiogram examinations, most                 recent 03/11/2019. CAD, Arrythmias:Atrial Fibrillation; Risk                 Factors:Dyslipidemia and Hypertension.  Sonographer:    Arlyss Gandy Referring Phys: [id number] [place]  1. Left ventricular ejection fraction, by estimation, is 55 to 60%. The left ventricle has normal function. The left ventricle has no regional wall motion abnormalities. The left ventricular internal cavity size was mildly dilated. Left ventricular diastolic parameters were normal.  2. Right ventricular systolic function is normal. The right ventricular size is normal. There is normal pulmonary artery systolic pressure.  3. The mitral valve is normal in structure. Mild mitral valve regurgitation.  4. The aortic valve is normal in structure. Aortic valve regurgitation is not visualized.  5. The inferior vena cava is normal in size with greater than 50% respiratory variability, suggesting right atrial pressure of 3 mmHg. FINDINGS  Left Ventricle: Left ventricular ejection fraction, by estimation, is 55 to 60%. The left ventricle has normal  function. The left ventricle has no regional wall motion abnormalities. The left ventricular internal cavity size was mildly dilated. There is  no left ventricular hypertrophy. Left ventricular diastolic parameters were normal. Right Ventricle: The right ventricular size is normal. Right vetricular wall thickness was not assessed. Right ventricular systolic function is normal. There is normal pulmonary artery systolic pressure. The tricuspid regurgitant velocity is 2.62 m/s, and with an assumed right atrial pressure of 3 mmHg, the estimated right ventricular systolic pressure is 30.8 mmHg. Left Atrium: Left atrial size was normal in size. Right Atrium: Right atrial size was normal in size. Pericardium: There is no evidence of pericardial effusion. Mitral Valve: The mitral valve is normal in structure. Mild mitral valve regurgitation. Tricuspid Valve: The tricuspid valve is normal in structure. Tricuspid valve regurgitation is mild. Aortic Valve: The aortic valve is normal in structure. Aortic valve regurgitation is not visualized. Aortic valve mean gradient measures 4.0 mmHg. Aortic valve peak gradient measures 8.2 mmHg. Aortic valve area, by VTI measures 2.24 cm. Pulmonic Valve: The pulmonic valve was not well visualized. Pulmonic valve regurgitation is not visualized. Aorta: The aortic root and ascending aorta are structurally normal, with no evidence of dilitation. Venous: The inferior vena cava is normal in size with greater than 50% respiratory variability, suggesting right atrial pressure of  3 mmHg. IAS/Shunts: The interatrial septum was not well visualized.  LEFT VENTRICLE PLAX 2D LVIDd:         5.30 cm   Diastology LVIDs:         4.10 cm   LV e' medial:    8.59 cm/s LV PW:         1.00 cm   LV E/e' medial:  10.7 LV IVS:        1.00 cm   LV e' lateral:   10.20 cm/s LVOT diam:     2.00 cm   LV E/e' lateral: 9.0 LV SV:         67 LV SV Index:   31 LVOT Area:     3.14 cm  RIGHT VENTRICLE            IVC RV  Basal diam:  3.80 cm    IVC diam: 1.50 cm RV Mid diam:    2.70 cm RV S prime:     6.96 cm/s TAPSE (M-mode): 2.1 cm LEFT ATRIUM             Index        RIGHT ATRIUM           Index LA diam:        4.80 cm 2.27 cm/m   RA Area:     22.70 cm LA Vol (A2C):   87.1 ml 41.12 ml/m  RA Volume:   68.10 ml  32.15 ml/m LA Vol (A4C):   55.2 ml 26.06 ml/m LA Biplane Vol: 72.6 ml 34.28 ml/m  AORTIC VALVE AV Area (Vmax):    2.22 cm AV Area (Vmean):   2.14 cm AV Area (VTI):     2.24 cm AV Vmax:           143.00 cm/s AV Vmean:          96.400 cm/s AV VTI:            0.297 m AV Peak Grad:      8.2 mmHg AV Mean Grad:      4.0 mmHg LVOT Vmax:         101.00 cm/s LVOT Vmean:        65.600 cm/s LVOT VTI:          0.212 m LVOT/AV VTI ratio: 0.71  AORTA Ao Root diam: 3.00 cm Ao Asc diam:  3.40 cm MITRAL VALVE               TRICUSPID VALVE MV Area (PHT): 3.77 cm    TR Peak grad:   27.5 mmHg MV Decel Time: 201 msec    TR Vmax:        262.00 cm/s MV E velocity: 91.70 cm/s MV A velocity: 46.30 cm/s  SHUNTS MV E/A ratio:  1.98        Systemic VTI:  0.21 m                            Systemic Diam: 2.00 cm Dorris Carnes MD Electronically signed by Dorris Carnes MD Signature Date/Time: 02/07/2021/12:21:10 PM    Final    CUP PACEART REMOTE DEVICE CHECK  Result Date: 01/23/2021 [name] summary report received. Battery status OK. Normal device function. No new  tachy, brady, or pause episodes. 12 new AF episodes poor rate control.  3 symptoms during AF with RVR, pt. reports taking Flecainide.  Burden 3.2%, Coreg, Xarelto prescribed.  Longest episode 10hrs.  Pt. has been instructed to call clinic prn symptoms.  Monthly summary reports and ROV/PRN LA      PHYSICAL EXAM  Temp:  [98.4 F (36.9 C)-99 F (37.2 C)] 98.6 F (37 C) (02/02 1135) Pulse Rate:  [61-75] 70 (02/02 1135) Resp:  [15-36] 16 (02/02 1135) BP: (108-175)/(49-94) 140/57 (02/02 1135) SpO2:  [94 %-100 %] 96 % (02/02 1135)  General - Well nourished, well developed, in no  apparent distress.  Cardiovascular - A Fib on telemetry.  Mental Status -  Level of arousal and orientation to time, place, and person were intact. Language including expression, naming, repetition, comprehension was assessed and found intact. Attention span and concentration were normal. Recent and remote memory were intact. Fund of Knowledge was assessed and was intact.  Cranial Nerves II - XII - II - Visual field with R superior quadrantanopia   III, IV, VI - Extraocular movements intact. V - Facial sensation intact bilaterally. VII - Facial movement intact bilaterally. VIII - Hearing & vestibular intact bilaterally. X - Palate elevates symmetrically. XII - Tongue protrusion intact.  Motor Strength - The patients strength was normal in all extremities and pronator drift was absent.  Bulk was normal and fasciculations were absent.   Motor Tone - Muscle tone was assessed at the appendages and was normal.  Sensory - Light touch was assessed and symmetrical.    Coordination - The patient had normal movements in the hands and feet with no ataxia or dysmetria.  Tremor was absent.  Gait and Station - deferred.   ASSESSMENT/PLAN [name] is a 71 y.o. female with history of paroxysmal A Fib compliant with Xarelto, HLD, HTN, OSA on CPAP, GERD, CAD, and MDD admitted for R visual field deficits. No tPA given due to outside of window.    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 Yes; Fluid consistency: Thin  Diet effective now                   Xarelto (rivaroxaban) daily prior to admission, now on aspirin 81 mg daily and Eliquis (apixaban) daily.  Patient counseled to be  compliant with her antithrombotic medications Ongoing aggressive stroke risk factor management Therapy recommendations:  No SLP follow-up; pending PT/OT evaluation Disposition:  pending PT/OT evaluation  Diabetes HgbA1c 5.7 (01/03/21) goal < 7.0 Controlled  Hypertension Stable Permissive hypertension (OK if <220/120) for 24 hours post stroke and then gradually normalized within 5-7 days. Long term BP goal normotensive  Hyperlipidemia Home meds:  N/A  LDL 117, goal < 70 Now on Rosuvastatin 10 mg Continue statin at discharge  Other Stroke Risk Factors Advanced age Obesity, There is no height or weight on file to calculate BMI.  Family hx stroke (Father, maternal grandfather) Coronary artery disease Obstructive sleep apnea, on CPAP at home  Other Active Problems Atrial Fibrillation Patient requested to switch from Xarelto to Eliquis for longer anti-coag coverage over 24 hr period. Initiate Eliquis 5 mg BID.  Hospital day # 0    Rosezetta Schlatter, MD Stroke Neurology- Neuro Psych Resident 02/07/2021 1:57 PM  STROKE MD NOTE : I have personally obtained history,examined this patient,  reviewed notes, independently viewed imaging studies, participated in medical decision making and plan of care.ROS completed by me personally and pertinent positives fully documented  I have made any additions or clarifications directly to the above note. Agree with note above.  Patient presented with right-sided peripheral vision loss secondary to embolic left occipital infarct due to atrial fibrillation despite being on anticoagulation with Xarelto and being compliant.  Recommend change to Eliquis 5 mg twice daily and Xarelto was not effective.  Patient had trouble affording the co-pay in the past but her insurance has changed and hopefully she can afford it now.  No need for concurrent aspirin and discontinue it.  Patient advised not to drive till her right superior quadrantanopsia visual defect improves.   Maintain aggressive risk factor modification.  Long discussion with patient and husband at the bedside and answered questions.  Discussed with Dr. Thurmond Butts.  Greater than 50% time during this 50-minute visit was spent in counseling and coordination of care about embolic stroke a discussion about anticoagulation options and risk benefit and answered questions.  Antony Contras, MD Medical Director [place] Pager: [phone] 02/07/2021 4:56 PM   To contact Stroke Continuity provider, please refer to [email or web address] After hours, contact General Neurology

## 2021-02-07 NOTE — TOC Transition Note (Signed)
Transition of Care [facility]) - CM/SW Discharge Note   Patient Details  Name: Shannon [name] MRN: [id number] Date of Birth: 1950/08/19  Transition of Care [facility]) CM/SW Contact:  Pollie Friar, RN Phone Number: 02/07/2021, 3:42 PM   Clinical Narrative:    Patient lives at home alone with her spouse and adult daughter. She has needed supervision. She denies any issues with home medications or transportation. DME: walker/ cpap/ shower seat/ elevated toilet Pt provided 30 day free coupon for Eliquis and she is comfortable with the copays.  She has transport home when discharged.    Final next level of care: Home/Self Care Barriers to Discharge: No Barriers Identified   Patient Goals and CMS Choice        Discharge Placement                       Discharge Plan and Services                                     Social Determinants of Health (SDOH) Interventions     Readmission Risk Interventions No flowsheet data found.

## 2021-02-08 ENCOUNTER — Ambulatory Visit (INDEPENDENT_AMBULATORY_CARE_PROVIDER_SITE_OTHER): Payer: Medicare Other | Admitting: Registered Nurse

## 2021-02-08 ENCOUNTER — Encounter: Payer: Self-pay | Admitting: Registered Nurse

## 2021-02-08 VITALS — BP 151/52 | HR 64 | Temp 98.0°F | Resp 18 | Ht 67.0 in | Wt 222.2 lb

## 2021-02-08 DIAGNOSIS — I639 Cerebral infarction, unspecified: Secondary | ICD-10-CM | POA: Diagnosis not present

## 2021-02-08 DIAGNOSIS — H5340 Unspecified visual field defects: Secondary | ICD-10-CM | POA: Diagnosis not present

## 2021-02-08 DIAGNOSIS — Z09 Encounter for follow-up examination after completed treatment for conditions other than malignant neoplasm: Secondary | ICD-10-CM

## 2021-02-08 LAB — HEMOGLOBIN A1C
Hgb A1c MFr Bld: 5.7 % — ABNORMAL HIGH (ref 4.8–5.6)
Mean Plasma Glucose: 117 mg/dL

## 2021-02-08 MED ORDER — ROSUVASTATIN CALCIUM 10 MG PO TABS: 10.0000 mg | ORAL_TABLET | Freq: Every day | ORAL | 0 refills | Status: DC

## 2021-02-08 MED ORDER — APIXABAN 5 MG PO TABS: 5.0000 mg | ORAL_TABLET | Freq: Two times a day (BID) | ORAL | 0 refills | Status: DC
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
## 2021-02-08 NOTE — Patient Instructions (Signed)
° ° ° °  If you have lab work done today you will be contacted with your lab results within the next 2 weeks.  If you have not heard from us then please contact us. The fastest way to get your results is to register for My Chart. ° ° °IF you received an x-ray today, you will receive an invoice from [facility]. Please contact [facility] at [phone] with questions or concerns regarding your invoice.  ° °IF you received labwork today, you will receive an invoice from [facility]. Please contact [facility] at [phone] with questions or concerns regarding your invoice.  ° °Our billing staff will not be able to assist you with questions regarding bills from these companies. ° °You will be contacted with the lab results as soon as they are available. The fastest way to get your results is to activate your My Chart account. Instructions are located on the last page of this paperwork. If you have not heard from us regarding the results in 2 weeks, please contact this office. °  ° ° ° °

## 2021-02-11 LAB — PTH, INTACT AND CALCIUM
Calcium: 9.7 mg/dL (ref 8.6–10.4)
PTH: 76 pg/mL (ref 16–77)

## 2021-02-19 ENCOUNTER — Encounter: Payer: Self-pay | Admitting: Registered Nurse

## 2021-02-19 ENCOUNTER — Telehealth (HOSPITAL_COMMUNITY): Payer: Self-pay | Admitting: *Deleted

## 2021-02-19 NOTE — Telephone Encounter (Signed)
Pt is asking for recommendations as she is having some severe fatigue and headaches

## 2021-02-19 NOTE — Telephone Encounter (Signed)
Patient called in stating she is having headaches, fatigue and aching for the last week - headaches since before the stroke. Pt is taking extra coreg to "quiet her afib" instructed pt to try going to prescribed dose of coreg and see if this helps fatigue. Instructed patient to contact PCP regarding aching and headache.

## 2021-02-21 NOTE — Telephone Encounter (Signed)
Pt has responded back to your message earlier
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
## 2021-02-25 ENCOUNTER — Ambulatory Visit (INDEPENDENT_AMBULATORY_CARE_PROVIDER_SITE_OTHER): Payer: Medicare Other

## 2021-02-25 DIAGNOSIS — I480 Paroxysmal atrial fibrillation: Secondary | ICD-10-CM | POA: Diagnosis not present

## 2021-02-26 ENCOUNTER — Telehealth: Payer: Self-pay

## 2021-02-26 NOTE — Telephone Encounter (Signed)
15-month office recall entered in epic.

## 2021-02-26 NOTE — Telephone Encounter (Signed)
-----   Message from Yetta Flock, MD sent at 02/26/2021  9:41 AM EST ----- Regarding: RE: Juluis Rainier - reason for cancel Sorry to hear this.  She should not have any colonoscopy for a period of time while she recovers from her stroke. I think would be good to have her follow up with [place] in 4 months or so for reassessment, see how she is doing and discuss plans moving forward.  Vernon Ariel can you help place a recall for clinic visit in 4 months or so? Thanks  ----- Message ----- From: Darden Dates Sent: 02/26/2021   8:45 AM EST To: Willia Craze, NP, Yetta Flock, MD Subject: Juluis Rainier - reason for cancel                        Good morning, This patient's referral came back into my que.  Noticed she cancelled procedure that was in a couple weeks.  Reason documented was patient had a stroke.  Didn't know if anyone got a msg to you guys, and if that makes a difference on if she needs to be done in the hospital now instead of [facility]. Have a great day! Thanks, Amy

## 2021-02-27 ENCOUNTER — Ambulatory Visit: Payer: Medicare Other | Admitting: Physician Assistant

## 2021-02-27 ENCOUNTER — Encounter: Payer: Self-pay | Admitting: Physician Assistant

## 2021-02-27 ENCOUNTER — Other Ambulatory Visit: Payer: Self-pay

## 2021-02-27 VITALS — BP 166/78 | HR 70 | Ht 67.0 in | Wt 221.6 lb

## 2021-02-27 DIAGNOSIS — I5032 Chronic diastolic (congestive) heart failure: Secondary | ICD-10-CM | POA: Diagnosis not present

## 2021-02-27 DIAGNOSIS — Z4509 Encounter for adjustment and management of other cardiac device: Secondary | ICD-10-CM

## 2021-02-27 DIAGNOSIS — I480 Paroxysmal atrial fibrillation: Secondary | ICD-10-CM

## 2021-02-27 DIAGNOSIS — I10 Essential (primary) hypertension: Secondary | ICD-10-CM

## 2021-02-27 LAB — CUP PACEART REMOTE DEVICE CHECK
Date Time Interrogation Session: 20230222113809
Implantable Pulse Generator Implant Date: 20210301

## 2021-02-27 MED ORDER — AMLODIPINE BESYLATE 10 MG PO TABS: 10.0000 mg | ORAL_TABLET | Freq: Every day | ORAL | 1 refills | Status: DC

## 2021-02-27 NOTE — Patient Instructions (Addendum)
Medication Instructions:   START TAKING AMLODIPINE 10 MG ONCE A DAY   *If you need a refill on your cardiac medications before your next appointment, please call your pharmacy*   Lab Work: NONE ORDERED  TODAY   If you have labs (blood work) drawn today and your tests are completely normal, you will receive your results only by: [place] (if you have MyChart) OR A paper copy in the mail If you have any lab test that is abnormal or we need to change your treatment, we will call you to review the results.   Testing/Procedures:NONE ORDERED  TODAY     Follow-Up: At [facility], you and your health needs are our priority.  As part of our continuing mission to provide you with exceptional heart care, we have created designated Provider Care Teams.  These Care Teams include your primary Cardiologist (physician) and Advanced Practice Providers (APPs -  Physician Assistants and Nurse Practitioners) who all work together to provide you with the care you need, when you need it.  We recommend signing up for the patient portal called "MyChart".  Sign up information is provided on this After Visit Summary.  MyChart is used to connect with patients for Virtual Visits (Telemedicine).  Patients are able to view lab/test results, encounter notes, upcoming appointments, etc.  Non-urgent messages can be sent to your provider as well.   To learn more about what you can do with MyChart, go to [email or web address].    Your next appointment:   6 week(s)  ( CONTACT [facility] FOR EP SCHEDULING ISSUES )   The format for your next appointment:   In Person  Provider:   Lars Mage, MD    Other Instructions

## 2021-02-28 ENCOUNTER — Ambulatory Visit: Payer: Medicare Other | Admitting: Psychiatry

## 2021-02-28 NOTE — Progress Notes (Signed)
Carelink Summary Report / Loop Recorder 

## 2021-03-11 ENCOUNTER — Telehealth (HOSPITAL_COMMUNITY): Payer: Self-pay | Admitting: *Deleted

## 2021-03-11 ENCOUNTER — Telehealth: Payer: Self-pay | Admitting: Physician Assistant

## 2021-03-11 MED ORDER — FUROSEMIDE 20 MG PO TABS: 20.0000 mg | ORAL_TABLET | Freq: Every day | ORAL | 3 refills | Status: DC

## 2021-03-11 NOTE — Telephone Encounter (Signed)
Patient called to AF clinic stating last week she started having increased lower extremity swelling especially since increasing amlodipine to '10mg'$  a day. Pt was to monitor over the weekend her sodium intake and elevate lower extremities with sitting if swelling continued she was to try returning to amlodipine '5mg'$  a day to see response of swelling. Pt states over weekend swelling worsened therefore she decreased amlodipine to '5mg'$  a day but swelling has continued. Today she reports dizziness, lower extremity swelling (some response to compression stockings) and "low BP for her" 132/65, 122/64.  Pt unsure of her next steps. Will forward to [name] PA for further recommendations with recent office visit.   ?

## 2021-03-11 NOTE — Telephone Encounter (Signed)
Called patient ?Doing OK, LE still swollen, perhaps not as much on the lower Amlodipine dose, though a little SOB with ambulation as well. ?No CP, palpitations ?BPs 130's-140's ? ?She will stay back on the '5mg'$  dose of amlodipine will add lasix '20mg'$  daily, she will take for the next 3 days and let us know how it is working/how she is feeling. ? ?I will ask my MA to update her med list/and change amlodipine with her pharmacy as well.  I have sent in the lasix for her ? ?[name], PA-C ?

## 2021-03-12 ENCOUNTER — Ambulatory Visit: Payer: Medicare Other | Admitting: Diagnostic Neuroimaging

## 2021-03-12 ENCOUNTER — Encounter: Payer: Medicare Other | Admitting: Gastroenterology

## 2021-03-13 ENCOUNTER — Other Ambulatory Visit: Payer: Self-pay | Admitting: *Deleted

## 2021-03-13 DIAGNOSIS — I10 Essential (primary) hypertension: Secondary | ICD-10-CM

## 2021-03-13 MED ORDER — AMLODIPINE BESYLATE 5 MG PO TABS: 5.0000 mg | ORAL_TABLET | Freq: Every day | ORAL | 2 refills | Status: DC

## 2021-03-22 ENCOUNTER — Telehealth: Payer: Self-pay | Admitting: Physician Assistant

## 2021-03-22 DIAGNOSIS — Z79899 Other long term (current) drug therapy: Secondary | ICD-10-CM

## 2021-03-22 DIAGNOSIS — I480 Paroxysmal atrial fibrillation: Secondary | ICD-10-CM

## 2021-03-22 NOTE — Telephone Encounter (Signed)
Called patient in regards to her my hart message, has a sense of unwell, edema is better, BP.HRs all sounds OK ?She is fatigued, a little lightheaded with standing, no syncope. ?Seems to connect to the recent adjustment in her meds. ?Will keep her at her amlodipne '5mg'$  and stop her lasix.  ?Labs on Monday ?She will keep an eye on her BP, HR, symptoms ? ?I have asked she reach out to her PMD as well regarding her other concerns. ?And call/reach out again if she needs. ? ?[name], PA-C ? ?

## 2021-03-25 ENCOUNTER — Other Ambulatory Visit: Payer: Medicare Other | Admitting: *Deleted

## 2021-03-25 ENCOUNTER — Other Ambulatory Visit: Payer: Self-pay

## 2021-03-25 DIAGNOSIS — Z79899 Other long term (current) drug therapy: Secondary | ICD-10-CM | POA: Diagnosis not present

## 2021-03-25 DIAGNOSIS — I480 Paroxysmal atrial fibrillation: Secondary | ICD-10-CM | POA: Diagnosis not present

## 2021-03-26 LAB — CBC
Hematocrit: 37.1 % (ref 34.0–46.6)
Hemoglobin: 12 g/dL (ref 11.1–15.9)
MCH: 28.8 pg (ref 26.6–33.0)
MCHC: 32.3 g/dL (ref 31.5–35.7)
MCV: 89 fL (ref 79–97)
Platelets: 346 10*3/uL (ref 150–450)
RBC: 4.17 x10E6/uL (ref 3.77–5.28)
RDW: 13.1 % (ref 11.7–15.4)
WBC: 8 10*3/uL (ref 3.4–10.8)

## 2021-03-26 LAB — BASIC METABOLIC PANEL
BUN/Creatinine Ratio: 11 — ABNORMAL LOW (ref 12–28)
BUN: 10 mg/dL (ref 8–27)
CO2: 26 mmol/L (ref 20–29)
Calcium: 9.6 mg/dL (ref 8.7–10.3)
Chloride: 99 mmol/L (ref 96–106)
Creatinine, Ser: 0.89 mg/dL (ref 0.57–1.00)
Glucose: 102 mg/dL — ABNORMAL HIGH (ref 70–99)
Potassium: 4.1 mmol/L (ref 3.5–5.2)
Sodium: 144 mmol/L (ref 134–144)
eGFR: 70 mL/min/{1.73_m2} (ref >59)

## 2021-03-27 ENCOUNTER — Encounter: Payer: Self-pay | Admitting: Neurology

## 2021-03-27 ENCOUNTER — Ambulatory Visit: Payer: Medicare Other | Admitting: Neurology

## 2021-03-27 VITALS — BP 164/86 | HR 62 | Ht 67.0 in | Wt 222.5 lb

## 2021-03-27 DIAGNOSIS — I639 Cerebral infarction, unspecified: Secondary | ICD-10-CM | POA: Diagnosis not present

## 2021-03-27 DIAGNOSIS — I482 Chronic atrial fibrillation, unspecified: Secondary | ICD-10-CM

## 2021-03-27 DIAGNOSIS — I69398 Other sequelae of cerebral infarction: Secondary | ICD-10-CM | POA: Diagnosis not present

## 2021-03-27 DIAGNOSIS — H5340 Unspecified visual field defects: Secondary | ICD-10-CM

## 2021-03-27 NOTE — Progress Notes (Signed)
?[facility] ?[address] ?[place]. [place] [address] ?[phone] ? ?     OFFICE FOLLOW-UP NOTE ? ?[name] ?Date of Birth:  1950-12-20 ?Medical Record Number:  [id number]  ? ?HPI: [name] is a 70 year old pleasant Caucasian lady seen today for initial office follow-up visit following hospital admission for stroke in February 2023.  History is obtained from the patient and review of electronic medical records and opossum reviewed pertinent available imaging films in PACS.  She has past medical history of hypertension, hyperlipidemia, paroxysmal A-fib on long-term anticoagulation with Xarelto, gastroesophageal flux disease, obstructive sleep apnea on CPAP, coronary artery disease who presented on 02/06/2021 with sudden onset of right-sided vision difficulties.  She woke up on the morning with vision not feeling right.  She had some blurred vision in the right upper quadrant and some pressure behind her eyes.  She was seen by ophthalmologist with thought she had some fluid behind her eyes.  On exam she was found to have right superior quadrantanopsia.  CT scan of the head was negative for any acute abnormality.  MRI scan of the brain showed left medial occipital acute infarct.  MR angiogram of the brain showed no significant intracranial large vessel stenosis.  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  MR angiogram of the neck showed no significant large vessel stenosis extracranially.  Patient LDL cholesterol was '1 1 7 ' mg percent hemoglobin A1c was 5.7.  Echocardiogram showed ejection fraction of 55 to 60%.  Patient was on Xarelto prior to admission and this after discussion of alternatives and risk benefit was switched to Eliquis.  She remains on Eliquis 5 mg twice daily and is tolerating it well without bruising or bleeding.  She has had no recurrent TIA or stroke symptoms.  She was also started on Crestor 10 mg which is tolerating well without side  effects.  Her blood pressure has been difficult to control and primary care physician increase amlodipine from 5 to 10 mg daily but this led to significant increased leg swelling and she was started on some Lasix which has not been stopped after amlodipine dose was reduced to 5 mg.  She has not been referred for any outpatient occupational l therapy. ? ROS:   ?14 system review of systems is positive for blurred vision, difficulty focusing leg swelling, blood pressure fluctuations all other systems negative ? ?PMH:  ?Past Medical History:  ?Diagnosis Date  ? Allergy   ? Anemia   ? Anxiety   ? Arthritis   ? "neck and lower back" (03/25/2016)  ? Asthma 1990s X 1  ? "short term inhaler use"   ? CAD (coronary artery disease)   ? Chronic lower back pain   ? Degenerative disorder of bone   ? Depression   ? Diastolic dysfunction   ? 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 GERD (gastroesophageal reflux disease)   ? Herniated disc, cervical   ? Hyperlipidemia   ? Hypertension   ? Neuromuscular disorder ([place])   ? 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 Orthostatic hypotension   ? OSA on CPAP   ? Osteopenia   ? PAF (paroxysmal atrial fibrillation) ([place])   ? Pinched nerve in neck   ? Sleep apnea   ? wears CPAP  ? T12 compression fracture ([place]) 11/2015  ? Vitamin D deficiency   ? Whiplash injury 06/07/2010  ? ? ?  Social History:  ?Social History  ? ?Socioeconomic History  ? Marital status: Married  ?  Spouse name: Not on file  ? Number of children: 2  ? Years of education: Not on file  ? Highest education level: Not on file  ?Occupational History  ? Occupation: retired  ?Tobacco Use  ? Smoking status: Former  ?  Packs/day: 0.50  ?  Years: 44.00  ?  Pack years: 22.00  ?  Types: Cigarettes, E-cigarettes  ?  Start date: 90  ?  Quit date: 09/06/2013  ?  Years since quitting: 7.5  ? Smokeless tobacco: Never  ?Vaping Use  ? Vaping Use: Former  ?Substance and Sexual  Activity  ? Alcohol use: Yes  ?  Comment: 03/25/2016 "nothing for a couple years now; was having a drink on anniversary and Christmas"  ? Drug use: No  ? Sexual activity: Not Currently  ?Other Topics Concern  ? Not on file  ?Social History Narrative  ? ** Merged History Encounter **  ?    ? ?Social Determinants of Health  ? ?Financial Resource Strain: Not on file  ?Food Insecurity: Not on file  ?Transportation Needs: Not on file  ?Physical Activity: Not on file  ?Stress: Not on file  ?Social Connections: Not on file  ?Intimate Partner Violence: Not on file  ? ? ?Medications:   ?Current Outpatient Medications on File Prior to Visit  ?Medication Sig Dispense Refill  ? acetaminophen (TYLENOL) 650 MG CR tablet Take 650-1,300 mg by mouth every 8 (eight) hours as needed for pain.     ? albuterol (VENTOLIN HFA) 108 (90 Base) MCG/ACT inhaler Inhale 1-2 puffs into the lungs every 6 (six) hours as needed for wheezing or shortness of breath. 8 g 2  ? amLODipine (NORVASC) 5 MG tablet Take 1 tablet (5 mg total) by mouth daily. 90 tablet 2  ? apixaban (ELIQUIS) 5 MG TABS tablet Take 1 tablet (5 mg total) by mouth 2 (two) times daily. 180 tablet 0  ? Carboxymethylcellulose Sodium (DRY EYE RELIEF OP) Apply 1 drop to eye as needed ([place]).    ? carvedilol (COREG) 6.25 MG tablet Take 12.5 mg by mouth 2 (two) times daily with a meal.    ? Cholecalciferol (VITAMIN D) 50 MCG (2000 UT) CAPS Take 2,000 Units by mouth daily.    ? DULoxetine (CYMBALTA) 30 MG capsule Take 1 capsule (30 mg total) by mouth daily. 90 capsule 3  ? flecainide (TAMBOCOR) 50 MG tablet Take 1 tablet (50 mg total) by mouth as needed. (Patient taking differently: Take 50 mg by mouth as needed (AFIB).) 90 tablet 3  ? Hydrocortisone (HENIDPOEU-23 EX) Apply 1 application topically as needed (Dry Skin).    ? loratadine (CLARITIN) 10 MG tablet Take 10 mg by mouth at bedtime.    ? LORazepam (ATIVAN) 0.5 MG tablet Take 1 tablet (0.5 mg total) by mouth every 8 (eight) hours  as needed for anxiety. 60 tablet 2  ? Na Sulfate-K Sulfate-Mg Sulf (SUPREP BOWEL PREP KIT) 17.5-3.13-1.6 GM/177ML SOLN Take 1 kit by mouth as directed. For colonoscopy prep 354 mL 0  ? rosuvastatin (CRESTOR) 10 MG tablet Take 1 tablet (10 mg total) by mouth daily. 90 tablet 0  ? triamcinolone (NASACORT) 55 MCG/ACT AERO nasal inhaler Place 2 sprays into the nose daily.    ? ?No current facility-administered medications on file prior to visit.  ? ? ?Allergies:   ?Allergies  ?Allergen Reactions  ? Ace Inhibitors Cough  ?  Elemental Sulfur Nausea And Vomiting  ? Hctz [Hydrochlorothiazide] Other (See Comments)  ?  Caused drug-induced LUPUS  ? Oxycodone Other (See Comments)  ?  Hallucinations  ? Prednisone Other (See Comments)  ?  Made patient very aggressive  ? Sulfa Antibiotics Nausea And Vomiting  ? Voltaren [Diclofenac Sodium] Other (See Comments)  ?  Made patient become aggressive  ? ? ?Physical Exam ?General: Obese pleasant middle-age Caucasian lady seated, in no evident distress ?Head: head normocephalic and atraumatic.  ?Neck: supple with no carotid or supraclavicular bruits ?Cardiovascular: regular rate and rhythm, no murmurs ?Musculoskeletal: no deformity ?Skin:  no rash/petichiae ?Vascular:  Normal pulses all extremities ?Vitals:  ? 03/27/21 1440  ?BP: (!) 164/86  ?Pulse: 62  ? ?Neurologic Exam ?Mental Status: Awake and fully alert. Oriented to place and time. Recent and remote memory intact. Attention span, concentration and fund of knowledge appropriate. Mood and affect appropriate.  ?Cranial Nerves: Fundoscopic exam reveals sharp disc margins. Pupils equal, briskly reactive to light. Extraocular movements full without nystagmus. Visual fields full showed partial right superior quadrantanopsia to confrontation. Hearing intact. Facial sensation intact. Face, tongue, palate moves normally and symmetrically.  ?Motor: Normal bulk and tone. Normal strength in all tested extremity muscles. ?Sensory.: intact to touch  ,pinprick .position and vibratory sensation.  ?Coordination: Rapid alternating movements normal in all extremities. Finger-to-nose and heel-to-shin performed accurately bilaterally. ?Gait and Station: A

## 2021-03-27 NOTE — Patient Instructions (Addendum)
I had a long d/w patient about her recent occiiptal stroke, right sided visual field deficit,atrial fibrillation , and answered questions.  Recommend she continue Eliquis 5 mg twice daily for stroke prevention for A-fib and maintain aggressive risk factor modification with strict control of hypertension with blood pressure goal below 130/90, lipids with LDL cholesterol goal below 70 mg percent and diabetes with hemoglobin A1c goal below 6.5%.  Recommend outpatient occupational therapy to cope with her visual field deficit.  She may start driving but advised her to be careful and to drive short distances over familiar routes only.  She was advised to follow-up with her primary care physician for management of leg swelling and blood pressure control. ? ?Stroke Prevention ?Some medical conditions and behaviors can lead to a higher chance of having a stroke. You can help prevent a stroke by eating healthy, exercising, not smoking, and managing any medical conditions you have. ?Stroke is a leading cause of functional impairment. Primary prevention is particularly important because a majority of strokes are first-time events. Stroke changes the lives of not only those who experience a stroke but also their family and other caregivers. ?How can this condition affect me? ?A stroke is a medical emergency and should be treated right away. A stroke can lead to brain damage and can sometimes be life-threatening. If a person gets medical treatment right away, there is a better chance of surviving and recovering from a stroke. ?What can increase my risk? ?The following medical conditions may increase your risk of a stroke: ?Cardiovascular disease. ?High blood pressure (hypertension). ?Diabetes. ?High cholesterol. ?Sickle cell disease. ?Blood clotting disorders (hypercoagulable state). ?Obesity. ?Sleep disorders (obstructive sleep apnea). ?Other risk factors include: ?Being older than age 42. ?Having a history of blood clots,  stroke, or mini-stroke (transient ischemic attack, TIA). ?Genetic factors, such as race, ethnicity, or a family history of stroke. ?Smoking cigarettes or using other tobacco products. ?Taking birth control pills, especially if you also use tobacco. ?Heavy use of alcohol or drugs, especially cocaine and methamphetamine. ?Physical inactivity. ?What actions can I take to prevent this? ?Manage your health conditions ?High cholesterol levels. ?Eating a healthy diet is important for preventing high cholesterol. If cholesterol cannot be managed through diet alone, you may need to take medicines. ?Take any prescribed medicines to control your cholesterol as told by your health care provider. ?Hypertension. ?To reduce your risk of stroke, try to keep your blood pressure below 130/80. ?Eating a healthy diet and exercising regularly are important for controlling blood pressure. If these steps are not enough to manage your blood pressure, you may need to take medicines. ?Take any prescribed medicines to control hypertension as told by your health care provider. ?Ask your health care provider if you should monitor your blood pressure at home. ?Have your blood pressure checked every year, even if your blood pressure is normal. Blood pressure increases with age and some medical conditions. ?Diabetes. ?Eating a healthy diet and exercising regularly are important parts of managing your blood sugar (glucose). If your blood sugar cannot be managed through diet and exercise, you may need to take medicines. ?Take any prescribed medicines to control your diabetes as told by your health care provider. ?Get evaluated for obstructive sleep apnea. Talk to your health care provider about getting a sleep evaluation if you snore a lot or have excessive sleepiness. ?Make sure that any other medical conditions you have, such as atrial fibrillation or atherosclerosis, are managed. ?Nutrition ?Follow instructions from your health care  provider  about what to eat or drink to help manage your health condition. These instructions may include: ?Reducing your daily calorie intake. ?Limiting how much salt (sodium) you use to 1,500 milligrams (mg) each day. ?Using only healthy fats for cooking, such as olive oil, canola oil, or sunflower oil. ?Eating healthy foods. You can do this by: ?Choosing foods that are high in fiber, such as whole grains, and fresh fruits and vegetables. ?Eating at least 5 servings of fruits and vegetables a day. Try to fill one-half of your plate with fruits and vegetables at each meal. ?Choosing lean protein foods, such as lean cuts of meat, poultry without skin, fish, tofu, beans, and nuts. ?Eating low-fat dairy products. ?Avoiding foods that are high in sodium. This can help lower blood pressure. ?Avoiding foods that have saturated fat, trans fat, and cholesterol. This can help prevent high cholesterol. ?Avoiding processed and prepared foods. ?Counting your daily carbohydrate intake. ? ?Lifestyle ?If you drink alcohol: ?Limit how much you have to: ?0-1 drink a day for women who are not pregnant. ?0-2 drinks a day for men. ?Know how much alcohol is in your drink. In the [place] one drink equals one 12 oz bottle of beer (372m), one 5 oz glass of wine (1417m, or one 1? oz glass of hard liquor (4458m ?Do not use any products that contain nicotine or tobacco. These products include cigarettes, chewing tobacco, and vaping devices, such as e-cigarettes. If you need help quitting, ask your health care provider. ?Avoid secondhand smoke. ?Do not use drugs. ?Activity ? ?Try to stay at a healthy weight. ?Get at least 30 minutes of exercise on most days, such as: ?Fast walking. ?Biking. ?Swimming. ?Medicines ?Take over-the-counter and prescription medicines only as told by your health care provider. Aspirin or blood thinners (antiplatelets or anticoagulants) may be recommended to reduce your risk of forming blood clots that can lead to  stroke. ?Avoid taking birth control pills. Talk to your health care provider about the risks of taking birth control pills if: ?You are over 35 61ars old. ?You smoke. ?You get very bad headaches. ?You have had a blood clot. ?Where to find more information ?American Stroke Association: www.strokeassociation.org ?Get help right away if: ?You or a loved one has any symptoms of a stroke. "BE FAST" is an easy way to remember the main warning signs of a stroke: ?B - Balance. Signs are dizziness, sudden trouble walking, or loss of balance. ?E - Eyes. Signs are trouble seeing or a sudden change in vision. ?F - Face. Signs are sudden weakness or numbness of the face, or the face or eyelid drooping on one side. ?A - Arms. Signs are weakness or numbness in an arm. This happens suddenly and usually on one side of the body. ?S - Speech. Signs are sudden trouble speaking, slurred speech, or trouble understanding what people say. ?T - Time. Time to call emergency services. Write down what time symptoms started. ?You or a loved one has other signs of a stroke, such as: ?A sudden, severe headache with no known cause. ?Nausea or vomiting. ?Seizure. ?These symptoms may represent a serious problem that is an emergency. Do not wait to see if the symptoms will go away. Get medical help right away. Call your local emergency services (911 in the [place] Do not drive yourself to the hospital. ?Summary ?You can help to prevent a stroke by eating healthy, exercising, not smoking, limiting alcohol intake, and managing any medical conditions you may have. ?Do  not use any products that contain nicotine or tobacco. These include cigarettes, chewing tobacco, and vaping devices, such as e-cigarettes. If you need help quitting, ask your health care provider. ?Remember "BE FAST" for warning signs of a stroke. Get help right away if you or a loved one has any of these signs. ?This information is not intended to replace advice given to you by your  health care provider. Make sure you discuss any questions you have with your health care provider. ?Document Revised: 07/25/2019 Document Reviewed: 07/25/2019 ?Elsevier Patient Education ? 2022 [place]. ?risk for recurr

## 2021-03-28 ENCOUNTER — Telehealth: Payer: Self-pay | Admitting: Neurology

## 2021-03-28 NOTE — Telephone Encounter (Signed)
Referral sent to [facility] [phone]. ?

## 2021-03-28 NOTE — Telephone Encounter (Signed)
Referral sent to [facility] [phone]. ?

## 2021-03-29 ENCOUNTER — Other Ambulatory Visit: Payer: Self-pay

## 2021-03-29 ENCOUNTER — Encounter: Payer: Self-pay | Admitting: Emergency Medicine

## 2021-03-29 ENCOUNTER — Telehealth: Payer: Self-pay | Admitting: *Deleted

## 2021-03-29 ENCOUNTER — Ambulatory Visit
Admission: EM | Admit: 2021-03-29 | Discharge: 2021-03-29 | Disposition: A | Discharge status: Home / Self Care | Payer: Medicare Other | Attending: Emergency Medicine | Admitting: Emergency Medicine

## 2021-03-29 ENCOUNTER — Telehealth: Payer: Self-pay

## 2021-03-29 DIAGNOSIS — R82998 Other abnormal findings in urine: Secondary | ICD-10-CM | POA: Diagnosis not present

## 2021-03-29 LAB — POCT URINALYSIS DIP (MANUAL ENTRY)
Bilirubin, UA: NEGATIVE
Blood, UA: NEGATIVE
Glucose, UA: NEGATIVE mg/dL
Ketones, POC UA: NEGATIVE mg/dL
Nitrite, UA: NEGATIVE
Protein Ur, POC: NEGATIVE mg/dL
Spec Grav, UA: <=1.005 — AB (ref 1.010–1.025)
Urobilinogen, UA: 0.2 E.U./dL
pH, UA: 6 (ref 5.0–8.0)

## 2021-03-29 NOTE — ED Triage Notes (Signed)
Pt c/o foamy urine x 1 week.  ?

## 2021-03-29 NOTE — Telephone Encounter (Signed)
-----   Message from [place], [place] sent at 03/28/2021  7:11 PM EDT ----- ?Please follow up with her in the morning.  I am for some reason no longer to get into the encounter.  I called and left a message to follow up tonight.  She can try to stop the Crestor and see if that helps but still should see her PMD. ?Thanks ?renee ?

## 2021-03-29 NOTE — Telephone Encounter (Signed)
Please advise. No appointments within 24 hours.  ? ?Nurse Assessment ?Nurse: Raphael Gibney, RN, Vanita Ingles Date/Time Eilene Ghazi Time): 03/29/2021 8:33:48 AM ?Confirm and document reason for call. If ?symptomatic, describe symptoms. ?---Caller states she had CVA 6 weeks ago. has had foamy urine for the past 2 weeks. Has fatigue and she is weak. she is lightheaded when she gets up. Has a headache. has been having chills. no pain or burning she urinates. sodium 144. has some sinus drainage. ? ?Does the patient have any new or worsening symptoms? ---Yes ?Will a triage be completed? ---Yes ?Related visit to physician within the last 2 weeks? ---Yes ?Does the PT have any chronic conditions? (i.e. diabetes, asthma, this includes High risk factors for pregnancy, etc.) ---Yes ?List chronic conditions. ---CVA; A fib ?Is this a behavioral health or substance abuse call? ---No ? ?Headache  ?[1] MODERATE headache (e.g., interferes with normal activities) ?[2] present > 24 hours  ?[3] unexplained (Exceptions: analgesics not tried, typical migraine, or headache part of viral illness ?Raphael Gibney, RN, Vanita Ingles 03/29/2021 8:39:21 AM ? ?03/29/2021 8:45:12 AM See PCP within 24 Hours Yes Raphael Gibney, RN, Vanita Ingles ? ?

## 2021-03-29 NOTE — Discharge Instructions (Signed)
Follow-up with your primary care provider on Monday.  Go to the emergency department if you have acute concerning symptoms.   ? ? ?

## 2021-03-29 NOTE — Telephone Encounter (Signed)
Patient is scheduled for Monday.

## 2021-03-29 NOTE — Telephone Encounter (Signed)
Spoke with patient about her symptoms and she is currently is tired and weak in her muscles all over her body every where and foggy urine. Patient mentioned that her neurologist states she need to be on a cholesterol medication. Per Renee patient is to contact PMD to discuss symptoms and see recommendations and to keep [place] updated. Patient was told Joseph Art will be update on how she is currently still feeling and for her to contact us back with what PMD has recommended. ?

## 2021-03-29 NOTE — Telephone Encounter (Signed)
Pt was advised to see PCP within 24 hours however no appointments remain to be seen this week, should I advise pt Urgent care or other work up? ? ?
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
## 2021-03-30 LAB — URINE CULTURE: Culture: NO GROWTH

## 2021-04-01 ENCOUNTER — Ambulatory Visit: Payer: Medicare Other | Admitting: Family Medicine

## 2021-04-01 ENCOUNTER — Ambulatory Visit (INDEPENDENT_AMBULATORY_CARE_PROVIDER_SITE_OTHER): Payer: Medicare Other

## 2021-04-01 ENCOUNTER — Ambulatory Visit (INDEPENDENT_AMBULATORY_CARE_PROVIDER_SITE_OTHER): Payer: Medicare Other | Admitting: Registered Nurse

## 2021-04-01 ENCOUNTER — Other Ambulatory Visit: Payer: Self-pay

## 2021-04-01 ENCOUNTER — Encounter: Payer: Self-pay | Admitting: Registered Nurse

## 2021-04-01 VITALS — BP 133/60 | HR 59 | Temp 98.1°F | Resp 18 | Ht 67.0 in | Wt 223.0 lb

## 2021-04-01 DIAGNOSIS — R5382 Chronic fatigue, unspecified: Secondary | ICD-10-CM | POA: Diagnosis not present

## 2021-04-01 DIAGNOSIS — F411 Generalized anxiety disorder: Secondary | ICD-10-CM

## 2021-04-01 DIAGNOSIS — Z79899 Other long term (current) drug therapy: Secondary | ICD-10-CM

## 2021-04-01 DIAGNOSIS — I480 Paroxysmal atrial fibrillation: Secondary | ICD-10-CM

## 2021-04-01 LAB — CBC WITH DIFFERENTIAL/PLATELET
Basophils Absolute: 0.1 10*3/uL (ref 0.0–0.1)
Basophils Relative: 0.7 % (ref 0.0–3.0)
Eosinophils Absolute: 0.4 10*3/uL (ref 0.0–0.7)
Eosinophils Relative: 5.5 % — ABNORMAL HIGH (ref 0.0–5.0)
HCT: 34 % — ABNORMAL LOW (ref 36.0–46.0)
Hemoglobin: 11.3 g/dL — ABNORMAL LOW (ref 12.0–15.0)
Lymphocytes Relative: 20.9 % (ref 12.0–46.0)
Lymphs Abs: 1.5 10*3/uL (ref 0.7–4.0)
MCHC: 33.3 g/dL (ref 30.0–36.0)
MCV: 87.9 fl (ref 78.0–100.0)
Monocytes Absolute: 0.7 10*3/uL (ref 0.1–1.0)
Monocytes Relative: 10.7 % (ref 3.0–12.0)
Neutro Abs: 4.3 10*3/uL (ref 1.4–7.7)
Neutrophils Relative %: 62.2 % (ref 43.0–77.0)
Platelets: 290 10*3/uL (ref 150.0–400.0)
RBC: 3.87 Mil/uL (ref 3.87–5.11)
RDW: 14.7 % (ref 11.5–15.5)
WBC: 7 10*3/uL (ref 4.0–10.5)

## 2021-04-01 LAB — COMPREHENSIVE METABOLIC PANEL
ALT: 17 U/L (ref 0–35)
AST: 16 U/L (ref 0–37)
Albumin: 4.2 g/dL (ref 3.5–5.2)
Alkaline Phosphatase: 75 U/L (ref 39–117)
BUN: 14 mg/dL (ref 6–23)
CO2: 30 mEq/L (ref 19–32)
Calcium: 9.4 mg/dL (ref 8.4–10.5)
Chloride: 102 mEq/L (ref 96–112)
Creatinine, Ser: 0.76 mg/dL (ref 0.40–1.20)
GFR: 79.74 mL/min (ref >60.00)
Glucose, Bld: 93 mg/dL (ref 70–99)
Potassium: 4.1 mEq/L (ref 3.5–5.1)
Sodium: 139 mEq/L (ref 135–145)
Total Bilirubin: 0.4 mg/dL (ref 0.2–1.2)
Total Protein: 6.9 g/dL (ref 6.0–8.3)

## 2021-04-01 MED ORDER — LORAZEPAM 0.5 MG PO TABS: 0.5000 mg | ORAL_TABLET | Freq: Three times a day (TID) | ORAL | 2 refills | Status: DC | PRN

## 2021-04-01 NOTE — Patient Instructions (Addendum)
Ms. Peaden -  ? ?Great to see you  ? ?Let's check on some labs  ? ?Check out [facility]erc[facility]  ? ?I'll be in touch with results. We can plan from there ? ?Thank you, ? ?Rich  ?

## 2021-04-01 NOTE — Telephone Encounter (Signed)
Pt seen today ?Thanks, ? ?Rich

## 2021-04-01 NOTE — Progress Notes (Signed)
? ?Established Patient Office Visit ? ?Subjective:  ?Patient ID: Shannon [name], female    DOB: [birth year]  Age: 71 y.o. MRN: [id number] ? ?CC:  ?Chief Complaint  ?Patient presents with  ? Medication Problem  ?  Patient states she has been having problems after medications. Patient states she has been getting cold , headaches , lightheaded, fatigue, weak, foamy urine, feet swelling , elevated BP. Pt states this is all after stroke she had   ? ? ?HPI ?Shannon [name] presents for med problem, UC follow up ? ?Last week noted to have foamy urine ?Seen in UC, no UTI ? ?Notes ongoing foamy urine, cold sensation, headaches, lightheadedness, fatigue, weakness, and some dependent edema. ?This was after hospitalization in early Feb for acute CVA ?Had been switched to eliquis from [place] at that time, started on rosuvastatin. Otherwise, meds stable.  ? ?Outpatient Medications Prior to Visit  ?Medication Sig Dispense Refill  ? acetaminophen (TYLENOL) 650 MG CR tablet Take 650-1,300 mg by mouth every 8 (eight) hours as needed for pain.     ? albuterol (VENTOLIN HFA) 108 (90 Base) MCG/ACT inhaler Inhale 1-2 puffs into the lungs every 6 (six) hours as needed for wheezing or shortness of breath. 8 g 2  ? amLODipine (NORVASC) 5 MG tablet Take 1 tablet (5 mg total) by mouth daily. 90 tablet 2  ? apixaban (ELIQUIS) 5 MG TABS tablet Take 1 tablet (5 mg total) by mouth 2 (two) times daily. 180 tablet 0  ? Carboxymethylcellulose Sodium (DRY EYE RELIEF OP) Apply 1 drop to eye as needed (Dryness).    ? carvedilol (COREG) 6.25 MG tablet Take 12.5 mg by mouth 2 (two) times daily with a meal.    ? Cholecalciferol (VITAMIN D) 50 MCG (2000 UT) CAPS Take 2,000 Units by mouth daily.    ? DULoxetine (CYMBALTA) 30 MG capsule Take 1 capsule (30 mg total) by mouth daily. 90 capsule 3  ? flecainide (TAMBOCOR) 50 MG tablet Take 1 tablet (50 mg total) by mouth as needed. (Patient taking differently: Take 50 mg by mouth as needed (AFIB).) 90  tablet 3  ? Hydrocortisone ([id number] EX) Apply 1 application topically as needed (Dry Skin).    ? loratadine (CLARITIN) 10 MG tablet Take 10 mg by mouth at bedtime.    ? Na Sulfate-K Sulfate-Mg Sulf (SUPREP BOWEL PREP KIT) 17.5-3.13-1.6 GM/177ML SOLN Take 1 kit by mouth as directed. For colonoscopy prep 354 mL 0  ? rosuvastatin (CRESTOR) 10 MG tablet Take 1 tablet (10 mg total) by mouth daily. 90 tablet 0  ? triamcinolone (NASACORT) 55 MCG/ACT AERO nasal inhaler Place 2 sprays into the nose daily.    ? LORazepam (ATIVAN) 0.5 MG tablet Take 1 tablet (0.5 mg total) by mouth every 8 (eight) hours as needed for anxiety. 60 tablet 2  ? ?No facility-administered medications prior to visit.  ? ? ?Review of Systems  ?Constitutional: Negative.   ?HENT: Negative.    ?Eyes: Negative.   ?Respiratory: Negative.    ?Cardiovascular: Negative.   ?Gastrointestinal: Negative.   ?Genitourinary: Negative.   ?Musculoskeletal: Negative.   ?Skin: Negative.   ?Neurological: Negative.   ?Psychiatric/Behavioral: Negative.    ?All other systems reviewed and are negative. ? ?  ?Objective:  ?  ? ?BP 133/60   Pulse (!) 59   Temp 98.1 ?F (36.7 ?C) (Temporal)   Resp 18   Ht _0  (1.702 m)   Wt 223 lb (101.2 kg)   SpO2 99%  BMI 34.93 kg/m?  ? ?Wt Readings from Last 3 Encounters:  ?04/01/21 223 lb (101.2 kg)  ?03/27/21 222 lb 8 oz (100.9 kg)  ?02/27/21 221 lb 9.6 oz (100.5 kg)  ? ?Physical Exam ?Vitals and nursing note reviewed.  ?Constitutional:   ?   General: She is not in acute distress. ?   Appearance: Normal appearance. She is normal weight. She is not ill-appearing, toxic-appearing or diaphoretic.  ?Cardiovascular:  ?   Rate and Rhythm: Normal rate and regular rhythm.  ?   Heart sounds: Normal heart sounds. No murmur heard. ?  No friction rub. No gallop.  ?Pulmonary:  ?   Effort: Pulmonary effort is normal. No respiratory distress.  ?   Breath sounds: Normal breath sounds. No stridor. No wheezing, rhonchi or rales.  ?Chest:  ?    Chest wall: No tenderness.  ?Skin: ?   General: Skin is warm and dry.  ?Neurological:  ?   General: No focal deficit present.  ?   Mental Status: She is alert and oriented to person, place, and time. Mental status is at baseline.  ?Psychiatric:     ?   Mood and Affect: Mood normal.     ?   Behavior: Behavior normal.     ?   Thought Content: Thought content normal.     ?   Judgment: Judgment normal.  ? ? ?No results found for any visits on 04/01/21. ? ? ? ?The ASCVD Risk score (Arnett DK, et al., 2019) failed to calculate for the following reasons: ?  The patient has a prior MI or stroke diagnosis ? ?  ?Assessment & Plan:  ? ?Problem List Items Addressed This Visit   ? ?  ? Other  ? Chronic fatigue  ? Relevant Orders  ? CK isoenzymes (brain, muscle injury)  ? CBC with Differential/Platelet  ? Comprehensive metabolic panel  ? TSH  ? T4, free  ? ?Other Visit Diagnoses   ? ? On statin therapy    -  Primary  ? Relevant Orders  ? CK isoenzymes (brain, muscle injury)  ? CBC with Differential/Platelet  ? Comprehensive metabolic panel  ? TSH  ? T4, free  ? GAD (generalized anxiety disorder)      ? Relevant Medications  ? LORazepam (ATIVAN) 0.5 MG tablet  ? ?  ? ? ?Meds ordered this encounter  ?Medications  ? LORazepam (ATIVAN) 0.5 MG tablet  ?  Sig: Take 1 tablet (0.5 mg total) by mouth every 8 (eight) hours as needed for anxiety.  ?  Dispense:  60 tablet  ?  Refill:  2  ?  Order Specific Question:   Supervising Provider  ?  Answer:   Carlota Raspberry, JEFFREY R [[id number]]  ? ? ?Return if symptoms worsen or fail to improve.  ? ?PLAN ?Check labs for indications of statin intolerance. ?Refill lorazepam ?Follow up as indicated ?Patient encouraged to call clinic with any questions, comments, or concerns. ? ?Maximiano Coss, NP ?

## 2021-04-02 ENCOUNTER — Other Ambulatory Visit: Payer: Self-pay | Admitting: Registered Nurse

## 2021-04-02 DIAGNOSIS — D649 Anemia, unspecified: Secondary | ICD-10-CM

## 2021-04-02 LAB — TSH: TSH: 0.86 u[IU]/mL (ref 0.35–5.50)

## 2021-04-02 LAB — T4, FREE: Free T4: 0.86 ng/dL (ref 0.60–1.60)

## 2021-04-03 LAB — CUP PACEART REMOTE DEVICE CHECK
Date Time Interrogation Session: 20230329100201
Implantable Pulse Generator Implant Date: 20210301

## 2021-04-04 ENCOUNTER — Other Ambulatory Visit: Payer: Self-pay

## 2021-04-04 ENCOUNTER — Emergency Department (HOSPITAL_COMMUNITY): Payer: Medicare Other

## 2021-04-04 ENCOUNTER — Emergency Department (HOSPITAL_COMMUNITY)
Admission: EM | Admit: 2021-04-04 | Discharge: 2021-04-05 | Disposition: A | Discharge status: Home / Self Care | Payer: Medicare Other | Attending: Emergency Medicine | Admitting: Emergency Medicine

## 2021-04-04 ENCOUNTER — Encounter (HOSPITAL_COMMUNITY): Payer: Self-pay | Admitting: Emergency Medicine

## 2021-04-04 DIAGNOSIS — I5032 Chronic diastolic (congestive) heart failure: Secondary | ICD-10-CM | POA: Insufficient documentation

## 2021-04-04 DIAGNOSIS — K219 Gastro-esophageal reflux disease without esophagitis: Secondary | ICD-10-CM | POA: Diagnosis not present

## 2021-04-04 DIAGNOSIS — R400 Somnolence: Secondary | ICD-10-CM | POA: Insufficient documentation

## 2021-04-04 DIAGNOSIS — I10 Essential (primary) hypertension: Secondary | ICD-10-CM | POA: Diagnosis not present

## 2021-04-04 DIAGNOSIS — I2510 Atherosclerotic heart disease of native coronary artery without angina pectoris: Secondary | ICD-10-CM | POA: Diagnosis not present

## 2021-04-04 DIAGNOSIS — G4733 Obstructive sleep apnea (adult) (pediatric): Secondary | ICD-10-CM | POA: Insufficient documentation

## 2021-04-04 DIAGNOSIS — I6523 Occlusion and stenosis of bilateral carotid arteries: Secondary | ICD-10-CM | POA: Diagnosis not present

## 2021-04-04 DIAGNOSIS — H53461 Homonymous bilateral field defects, right side: Secondary | ICD-10-CM | POA: Diagnosis not present

## 2021-04-04 DIAGNOSIS — I69398 Other sequelae of cerebral infarction: Secondary | ICD-10-CM | POA: Diagnosis not present

## 2021-04-04 DIAGNOSIS — H539 Unspecified visual disturbance: Secondary | ICD-10-CM

## 2021-04-04 DIAGNOSIS — I700 Atherosclerosis of aorta: Secondary | ICD-10-CM | POA: Insufficient documentation

## 2021-04-04 DIAGNOSIS — I6782 Cerebral ischemia: Secondary | ICD-10-CM | POA: Diagnosis not present

## 2021-04-04 DIAGNOSIS — Z20822 Contact with and (suspected) exposure to covid-19: Secondary | ICD-10-CM | POA: Diagnosis not present

## 2021-04-04 DIAGNOSIS — H538 Other visual disturbances: Secondary | ICD-10-CM | POA: Insufficient documentation

## 2021-04-04 DIAGNOSIS — I480 Paroxysmal atrial fibrillation: Secondary | ICD-10-CM | POA: Insufficient documentation

## 2021-04-04 DIAGNOSIS — I110 Hypertensive heart disease with heart failure: Secondary | ICD-10-CM | POA: Diagnosis not present

## 2021-04-04 DIAGNOSIS — E785 Hyperlipidemia, unspecified: Secondary | ICD-10-CM | POA: Insufficient documentation

## 2021-04-04 DIAGNOSIS — I639 Cerebral infarction, unspecified: Secondary | ICD-10-CM | POA: Diagnosis not present

## 2021-04-04 DIAGNOSIS — Z7901 Long term (current) use of anticoagulants: Secondary | ICD-10-CM | POA: Insufficient documentation

## 2021-04-04 LAB — I-STAT CHEM 8, ED
BUN: 11 mg/dL (ref 8–23)
Calcium, Ion: 1.16 mmol/L (ref 1.15–1.40)
Chloride: 102 mmol/L (ref 98–111)
Creatinine, Ser: 0.7 mg/dL (ref 0.44–1.00)
Glucose, Bld: 101 mg/dL — ABNORMAL HIGH (ref 70–99)
HCT: 38 % (ref 36.0–46.0)
Hemoglobin: 12.9 g/dL (ref 12.0–15.0)
Potassium: 3.9 mmol/L (ref 3.5–5.1)
Sodium: 141 mmol/L (ref 135–145)
TCO2: 29 mmol/L (ref 22–32)

## 2021-04-04 LAB — COMPREHENSIVE METABOLIC PANEL
ALT: 21 U/L (ref 0–44)
AST: 22 U/L (ref 15–41)
Albumin: 4 g/dL (ref 3.5–5.0)
Alkaline Phosphatase: 82 U/L (ref 38–126)
Anion gap: 7 (ref 5–15)
BUN: 11 mg/dL (ref 8–23)
CO2: 27 mmol/L (ref 22–32)
Calcium: 9.4 mg/dL (ref 8.9–10.3)
Chloride: 105 mmol/L (ref 98–111)
Creatinine, Ser: 0.74 mg/dL (ref 0.44–1.00)
GFR, Estimated: >60 mL/min (ref >60)
Glucose, Bld: 104 mg/dL — ABNORMAL HIGH (ref 70–99)
Potassium: 3.9 mmol/L (ref 3.5–5.1)
Sodium: 139 mmol/L (ref 135–145)
Total Bilirubin: 0.6 mg/dL (ref 0.3–1.2)
Total Protein: 7.7 g/dL (ref 6.5–8.1)

## 2021-04-04 LAB — DIFFERENTIAL
Abs Immature Granulocytes: 0.02 10*3/uL (ref 0.00–0.07)
Basophils Absolute: 0.1 10*3/uL (ref 0.0–0.1)
Basophils Relative: 1 %
Eosinophils Absolute: 0.4 10*3/uL (ref 0.0–0.5)
Eosinophils Relative: 5 %
Immature Granulocytes: 0 %
Lymphocytes Relative: 25 %
Lymphs Abs: 2 10*3/uL (ref 0.7–4.0)
Monocytes Absolute: 0.9 10*3/uL (ref 0.1–1.0)
Monocytes Relative: 11 %
Neutro Abs: 4.6 10*3/uL (ref 1.7–7.7)
Neutrophils Relative %: 58 %

## 2021-04-04 LAB — RESP PANEL BY RT-PCR (FLU A&B, COVID) ARPGX2
Influenza A by PCR: NEGATIVE
Influenza B by PCR: NEGATIVE
SARS Coronavirus 2 by RT PCR: NEGATIVE

## 2021-04-04 LAB — CBC
HCT: 38.6 % (ref 36.0–46.0)
Hemoglobin: 12.6 g/dL (ref 12.0–15.0)
MCH: 29.4 pg (ref 26.0–34.0)
MCHC: 32.6 g/dL (ref 30.0–36.0)
MCV: 90.2 fL (ref 80.0–100.0)
Platelets: 307 10*3/uL (ref 150–400)
RBC: 4.28 MIL/uL (ref 3.87–5.11)
RDW: 14.1 % (ref 11.5–15.5)
WBC: 8 10*3/uL (ref 4.0–10.5)
nRBC: 0 % (ref 0.0–0.2)

## 2021-04-04 LAB — RAPID URINE DRUG SCREEN, HOSP PERFORMED
Amphetamines: NOT DETECTED
Barbiturates: NOT DETECTED
Benzodiazepines: NOT DETECTED
Cocaine: NOT DETECTED
Opiates: NOT DETECTED
Tetrahydrocannabinol: NOT DETECTED

## 2021-04-04 LAB — CBG MONITORING, ED: Glucose-Capillary: 85 mg/dL (ref 70–99)

## 2021-04-04 LAB — URINALYSIS, ROUTINE W REFLEX MICROSCOPIC
Bilirubin Urine: NEGATIVE
Glucose, UA: NEGATIVE mg/dL
Hgb urine dipstick: NEGATIVE
Ketones, ur: NEGATIVE mg/dL
Leukocytes,Ua: NEGATIVE
Nitrite: NEGATIVE
Protein, ur: NEGATIVE mg/dL
Specific Gravity, Urine: 1.01 (ref 1.005–1.030)
pH: 8 (ref 5.0–8.0)

## 2021-04-04 LAB — VITAMIN B12: Vitamin B-12: 257 pg/mL (ref 180–914)

## 2021-04-04 LAB — APTT: aPTT: 32 seconds (ref 24–36)

## 2021-04-04 LAB — FOLATE: Folate: 10.9 ng/mL (ref >5.9)

## 2021-04-04 LAB — PROTIME-INR
INR: 1.2 (ref 0.8–1.2)
Prothrombin Time: 14.7 seconds (ref 11.4–15.2)

## 2021-04-04 LAB — ETHANOL: Alcohol, Ethyl (B): <10 mg/dL (ref <10)

## 2021-04-04 IMAGING — CT CT ANGIO HEAD-NECK (W OR W/O PERF)
2 of 7 series · 8 of 33 positions shown · non-contrast
Comparison: CT head without contrast [DATE]. MR head and MRA
head and neck [DATE].

CLINICAL DATA: Right visual field defect. Recent left occipital
lobe infarct.

EXAM:
CT ANGIOGRAPHY HEAD AND NECK
TECHNIQUE: Multidetector CT imaging of the head and neck was performed using
the standard protocol during bolus administration of intravenous
contrast. Multiplanar CT image reconstructions and MIPs were
obtained to evaluate the vascular anatomy. Carotid stenosis
measurements (when applicable) are obtained utilizing NASCET
criteria, using the distal internal carotid diameter as the
denominator.

[Series 5: cta neck · axial · 0.47mm/px · z∈[+780,+900]mm · 2 of 182 slices shown]
[im 61/182  soft-tissue]
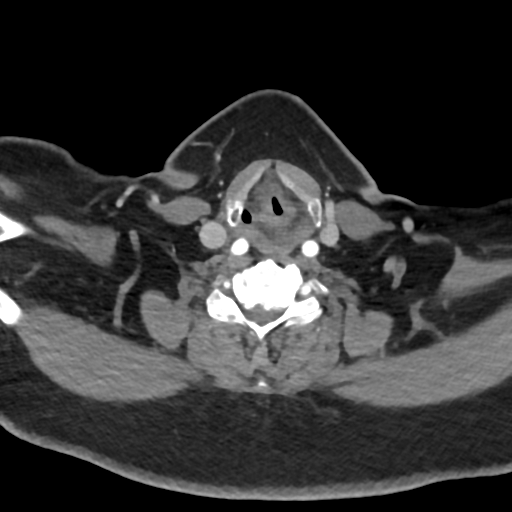
[im 121/182  soft-tissue]
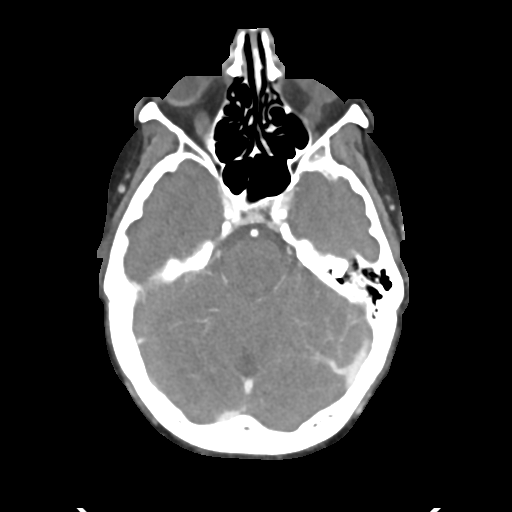

[Series 7: cta neck axial · axial · 0.49mm/px · z∈[+712,+971]mm · 6 of 363 slices shown]
[im 52/363  soft-tissue]
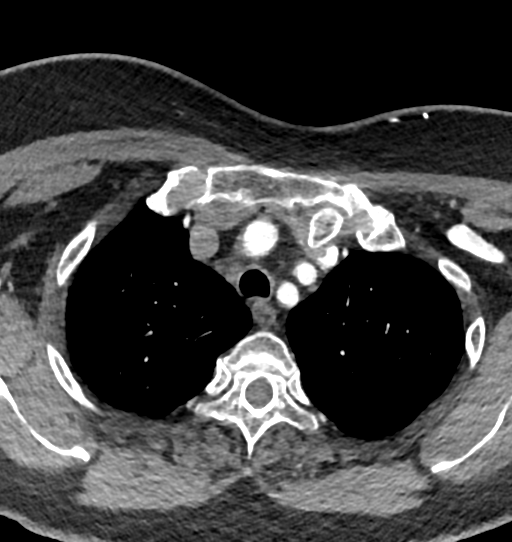
[im 104/363  bone]
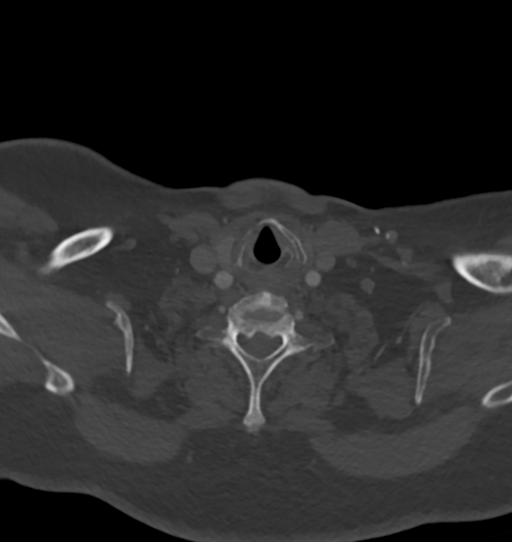
[im 156/363  soft-tissue]
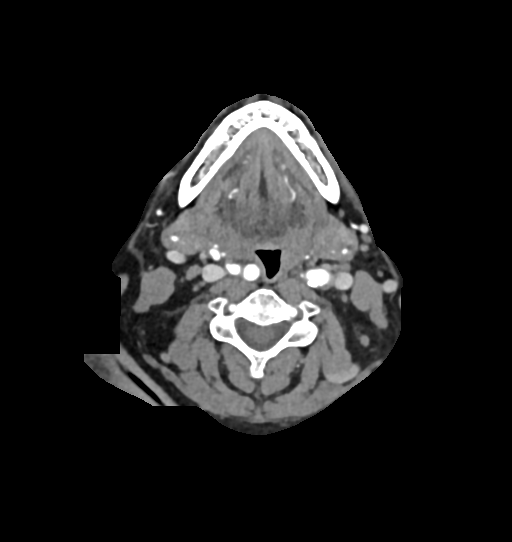
[im 207/363  bone]
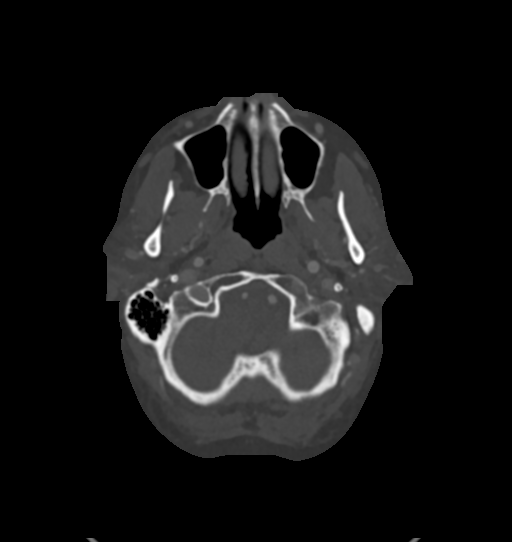
[im 259/363  soft-tissue]
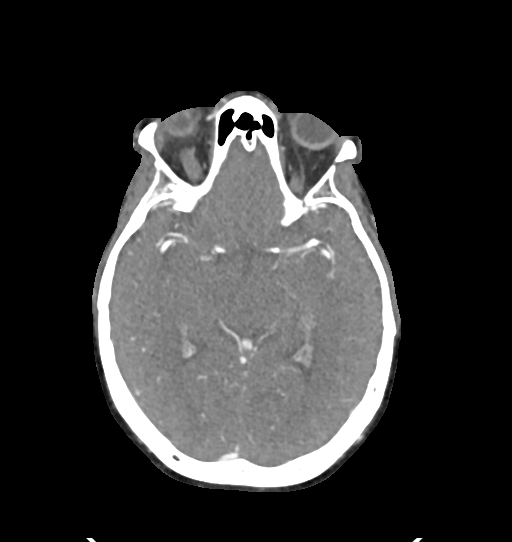
[im 311/363  bone]
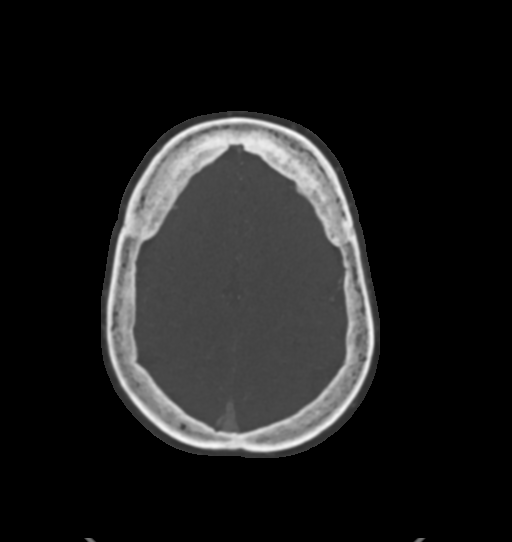

[8 of 33 positions shown; findings below may reference images not displayed]

RADIATION DOSE REDUCTION: This exam was performed according to the
departmental dose-optimization program which includes automated
exposure control, adjustment of the mA and/or kV according to
patient size and/or use of iterative reconstruction technique.

CONTRAST:  75mL OMNIPAQUE IOHEXOL 350 MG/ML SOLN
FINDINGS: CTA NECK FINDINGS

Aortic arch: A common origin of the left common carotid artery and
innominate artery is noted. Atherosclerotic calcifications are
present at the aorta great vessel origins. No significant stenosis
or aneurysm is present.

Right carotid system: Right common carotid artery is within normal
limits. Atherosclerotic calcifications are present bifurcation
without significant stenosis. Moderate tortuosity is present
cervical right ICA without significant stenosis.

Left carotid system: Atherosclerotic calcifications are present in
the distal left common carotid artery and bifurcation without
significant stenosis. Cervical left ICA demonstrates moderate
tortuosity without significant stenosis.

Vertebral arteries: The left vertebral artery is slightly dominant
to the right. Both vertebral arteries originate from the subclavian
arteries without significant stenosis. No significant stenosis is
present in either vertebral artery in the neck.

Skeleton: Mild degenerative changes of the cervical spine are noted.
No focal osseous lesions are present.

Other neck: Soft tissues the neck are otherwise unremarkable.
Salivary glands are within normal limits. Thyroid is normal. No
significant adenopathy is present. No focal mucosal or submucosal
lesions are present.

Upper chest: The lung apices are clear. Thoracic inlet is within
normal limits.

Review of the MIP images confirms the above findings

CTA HEAD FINDINGS

Anterior circulation: Internal carotid arteries are within normal
limits the skull base through the ICA termini. The A1 and segments
normal. No ICA terminus aneurysm present indicating artery is
patent. MCA bifurcations are intact. ACA and MCA branch vessels are
within normal limits.

Posterior circulation: Left vertebral artery is dominant. PICA
origins are visualized and normal. Basilar artery is within normal
limits. Both posterior cerebral arteries originate from basilar tip.
PCA branch vessels are within normal limits.

Venous sinuses: The dural sinuses are patent. The straight sinus
deep cerebral veins are intact. Cortical veins are within normal
limits. No significant vascular malformation is evident.

Anatomic variants: None

Review of the MIP images confirms the above findings
IMPRESSION: 1. Moderate tortuosity of the cervical internal carotid arteries
bilaterally without significant stenosis.
2. Normal variant CTA Circle of Willis without significant proximal
stenosis, aneurysm, or branch vessel occlusion.
3. Aortic Atherosclerosis ([O1]-[O1]).

## 2021-04-04 IMAGING — CT CT HEAD CODE STROKE
3 series · 14 of 47 positions shown, 16 images · non-contrast
Comparison: CT head without contrast and MR head without contrast
[DATE]

CLINICAL DATA: Code stroke. Neuro deficit, stroke suspected.
Abnormal sensation in the right foot. Right eye visual disturbance.
Slight left facial droop.



[Series 3: head 5.0 st · axial · 0.45mm/px · z∈[+895,+1035]mm · 8 of 34 slices shown, 10 images]
[im 3/34  brain]
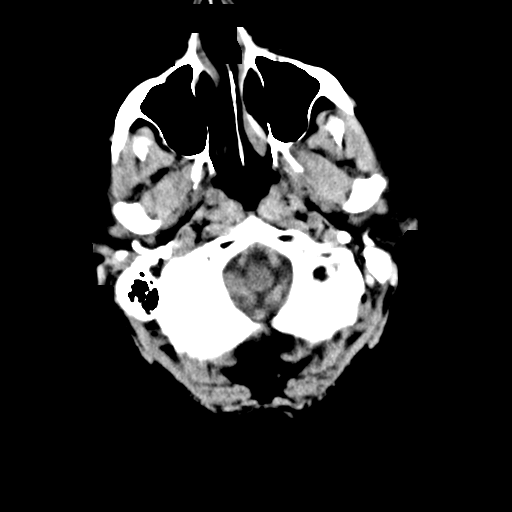
[im 3/34  bone]
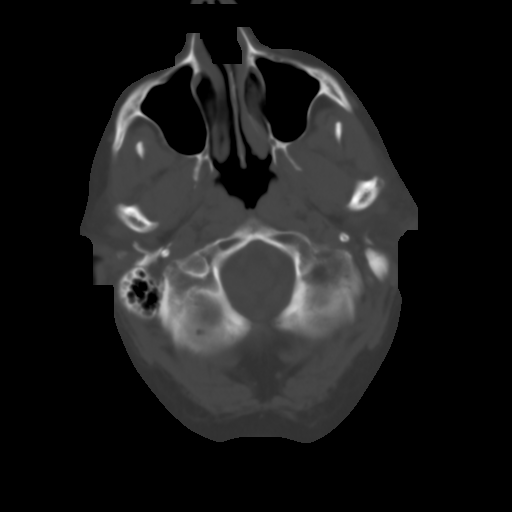
[im 7/34  brain]
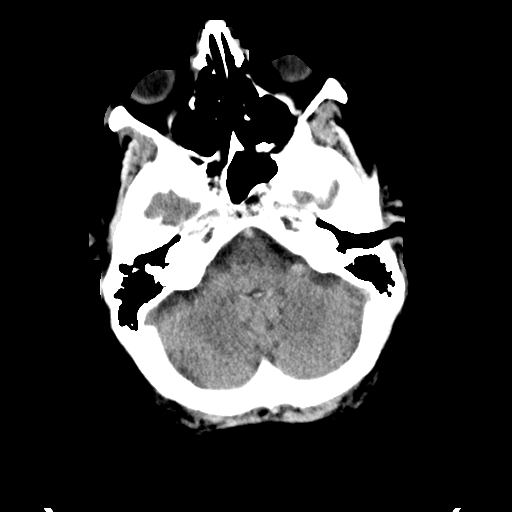
[im 11/34  brain]
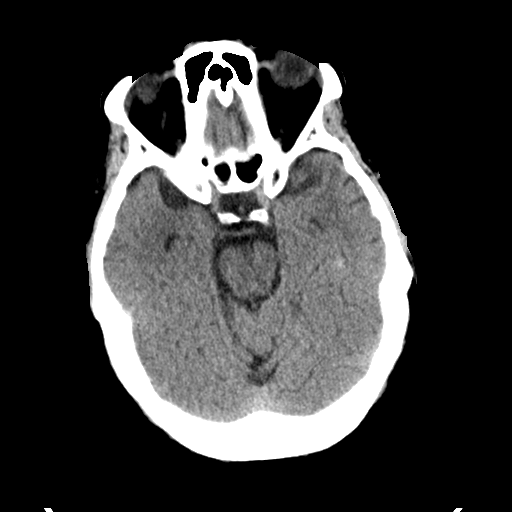
[im 15/34  brain]
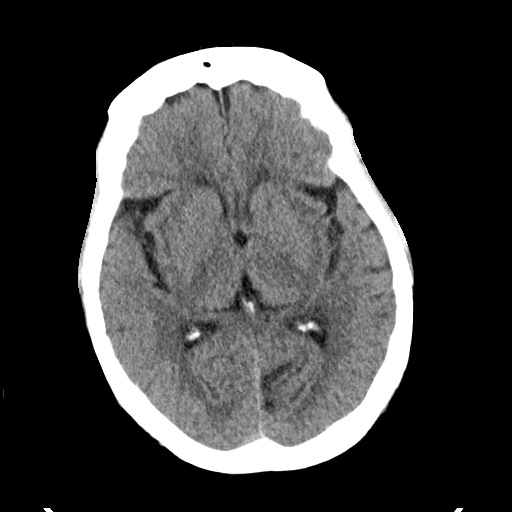
[im 19/34  brain]
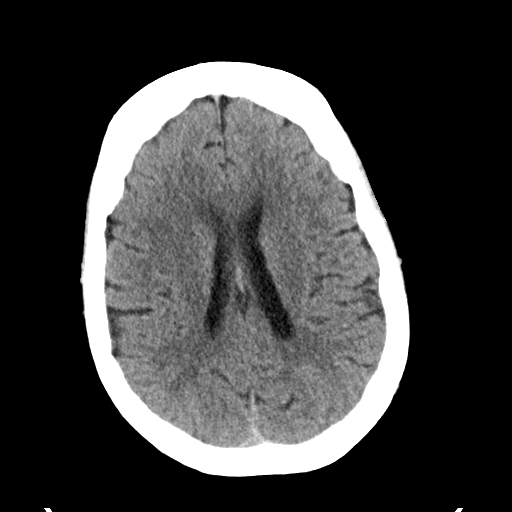
[im 19/34  bone]
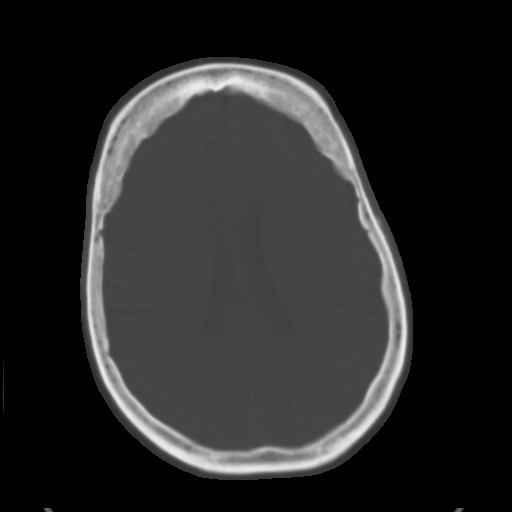
[im 23/34  brain]
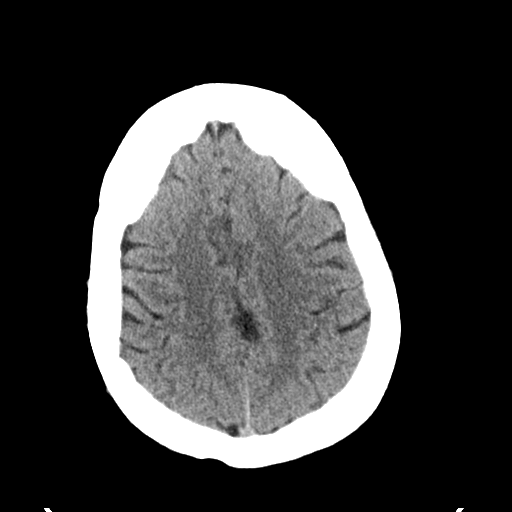
[im 27/34  brain]
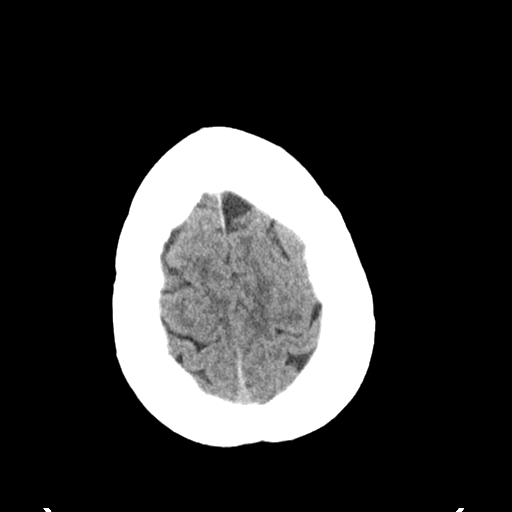
[im 31/34  brain]
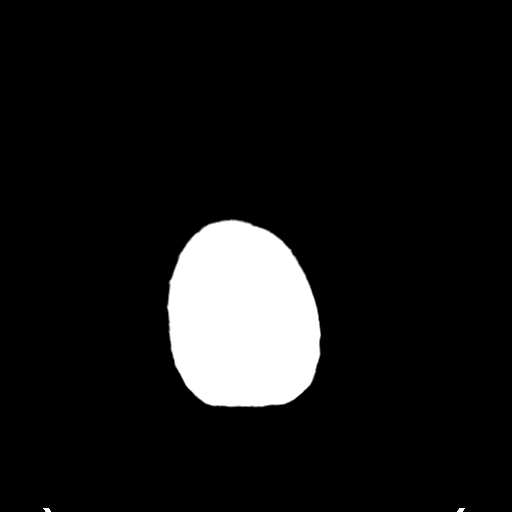

[Series 5: head 3.0 cor st · coronal · 0.33mm/px · 3 of 72 slices shown]
[im 24/72  brain]
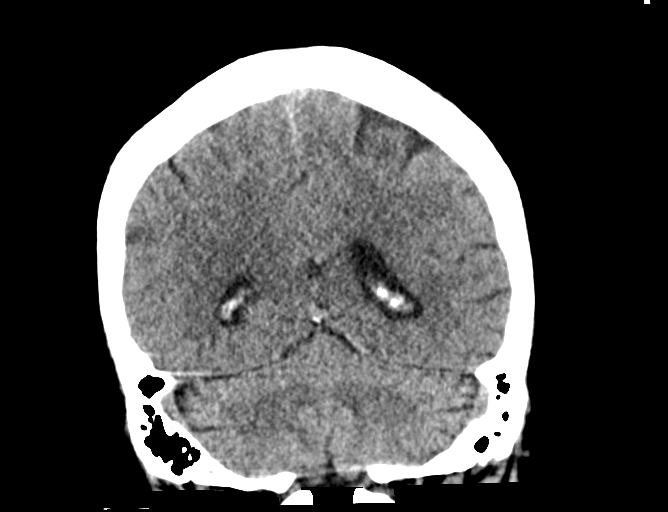
[im 32/72  brain]
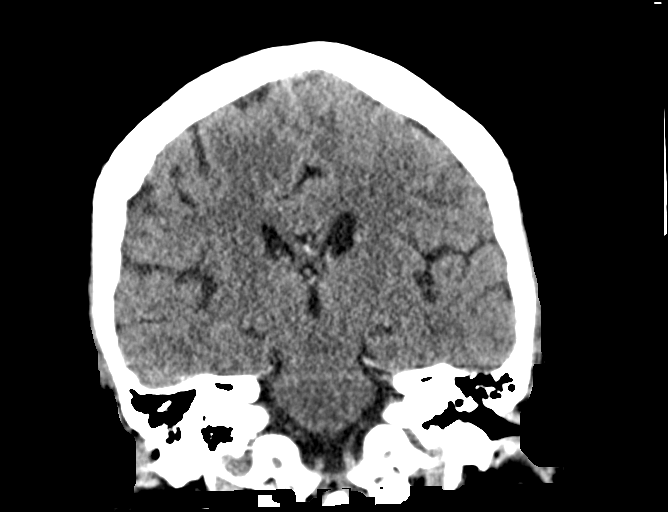
[im 40/72  brain]
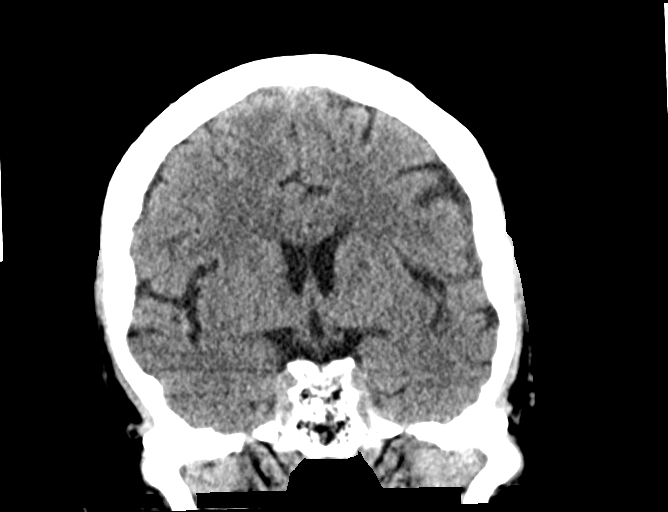

[Series 6: head 3.0 sag st · sagittal · 0.35mm/px · 3 of 67 slices shown]
[im 23/67  brain]
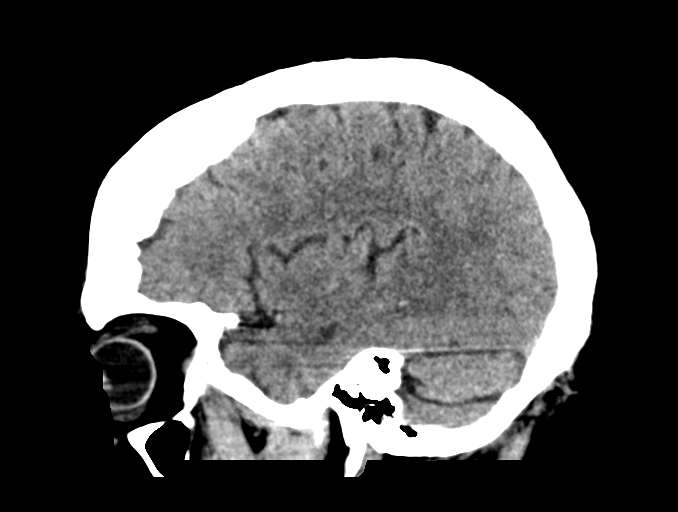
[im 34/67  brain]
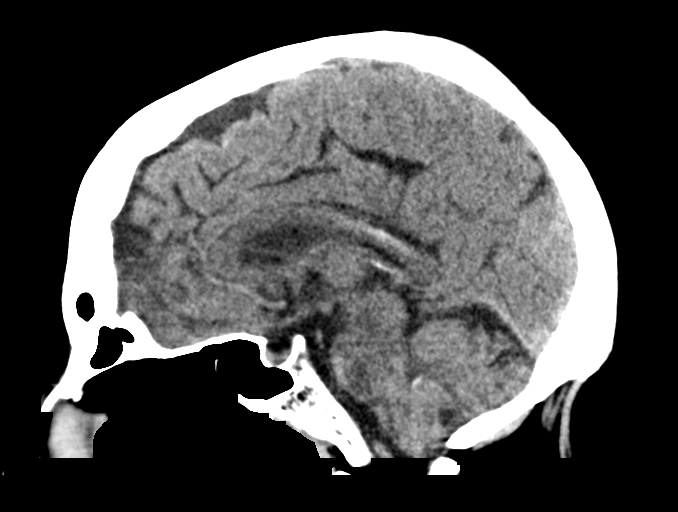
[im 45/67  brain]
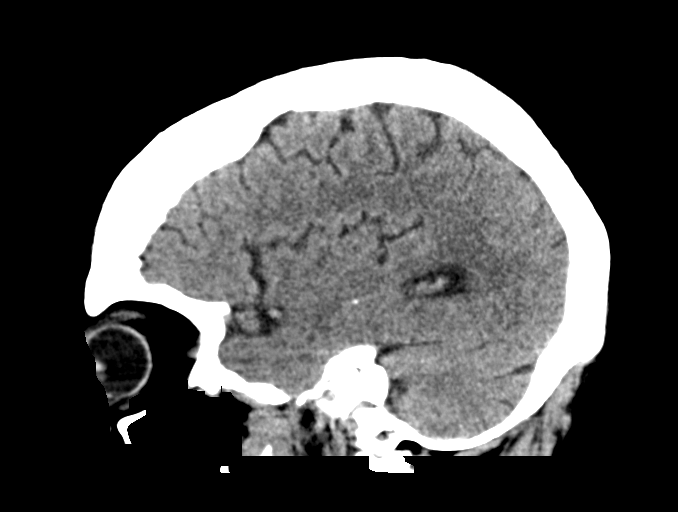

[14 of 47 positions shown; findings below may reference images not displayed]

FINDINGS: Brain: Expected evolution of the medial left occipital lobe infarct
is noted. No acute infarct, hemorrhage, or mass lesion is present.
Mild atrophy and white matter changes are otherwise stable. Basal
ganglia are intact. Insular ribbon is normal. The brainstem and
cerebellum are within normal limits.

Vascular: No hyperdense vessel or unexpected calcification.

Skull: Calvarium is intact. No focal lytic or blastic lesions are
present. No significant extracranial soft tissue lesion is present.

Sinuses/Orbits: The paranasal sinuses and mastoid air cells are
clear. The globes and orbits are within normal limits.

Other:

ASPECTS ([place] Stroke Program Early CT Score)

- Ganglionic level infarction (caudate, lentiform nuclei, internal
capsule, insula, M1-M3 cortex): [DATE]

- Supraganglionic infarction (M4-M6 cortex): [DATE]

Total score (0-10 with 10 being normal): [DATE]
IMPRESSION: 1. Expected evolution of medial left occipital lobe infarct.
2. No acute intracranial abnormality or significant interval change.
3. Stable mild atrophy and white matter disease.
4. Aspects [DATE]
* The above was relayed via text pager to Dr. BILLIOT on

## 2021-04-04 IMAGING — MR MR HEAD W/O CM
12 of 13 series · 44 of 48 positions shown · non-contrast
Comparison: Comparison made with prior CTs from [DATE].

CLINICAL DATA: Initial evaluation for acute neuro deficit, stroke
suspected.

EXAM:
MRI HEAD WITHOUT CONTRAST
TECHNIQUE: Multiplanar, multiecho pulse sequences of the brain and surrounding
structures were obtained without intravenous contrast.

[Series 5: DWI · axial · 3.0mm · 0.96mm/px · z∈[-84,+93]mm · 7 of 120 slices shown (1 of 4)]
[im 1/120]
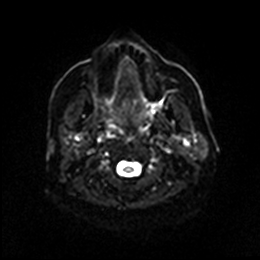
[im 20/120]
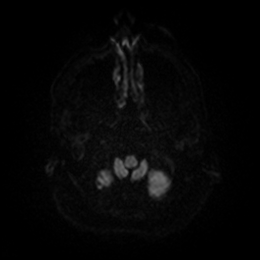
[im 40/120]
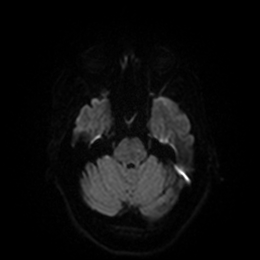
[im 60/120]
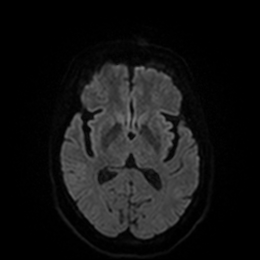
[im 80/120]
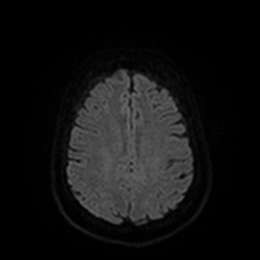
[im 100/120]
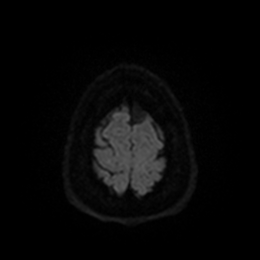
[im 120/120]
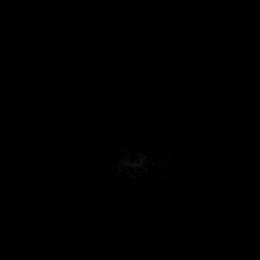

[Series 6: DWI · axial · 3.0mm · 0.96mm/px · z∈[-84,+93]mm · 4 of 60 slices shown (2 of 4)]
[im 1/60]
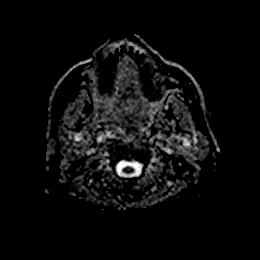
[im 20/60]
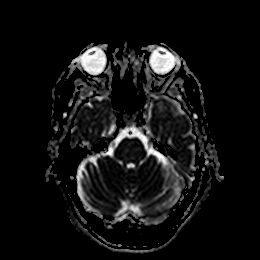
[im 40/60]
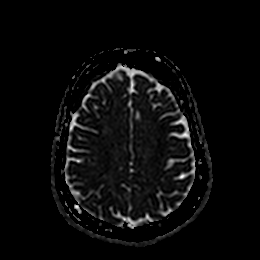
[im 60/60]
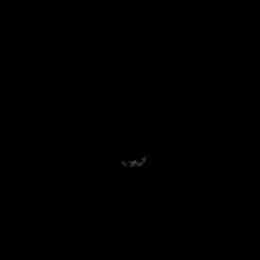

[Series 7: DWI · coronal · 4.0mm · 0.88mm/px · 6 of 86 slices shown (3 of 4)]
[im 1/86]
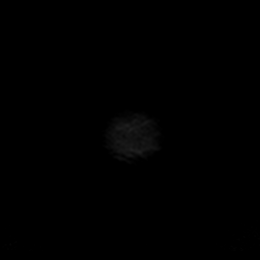
[im 18/86]
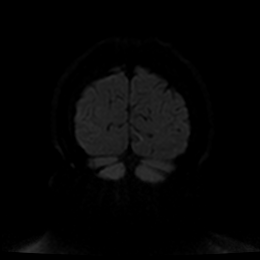
[im 35/86]
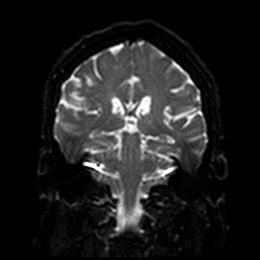
[im 52/86]
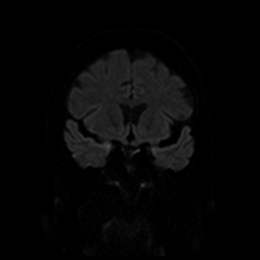
[im 69/86]
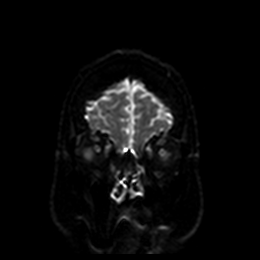
[im 86/86]
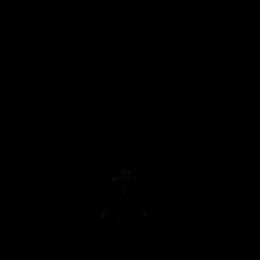

[Series 8: DWI · coronal · 4.0mm · 0.88mm/px · 3 of 43 slices shown (4 of 4)]
[im 1/43]
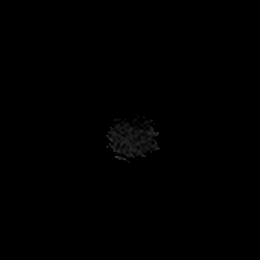
[im 22/43]
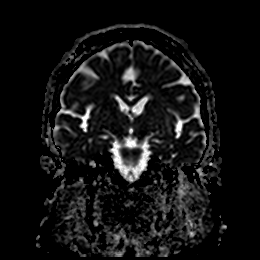
[im 43/43]
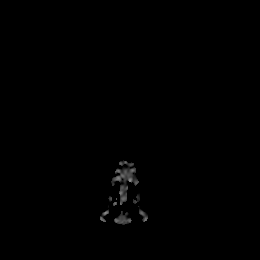

[Series 9: T1 · sagittal · 5.0mm · 0.78mm/px · 2 of 29 slices shown]
[im 1/29]
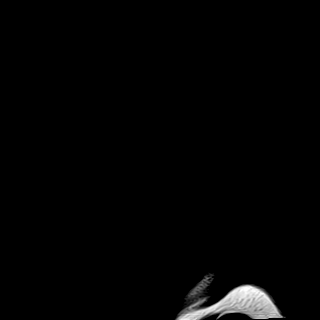
[im 29/29]
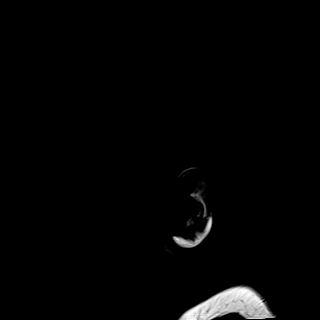

[Series 10: T2 · axial · 5.0mm · 0.78mm/px · z∈[-82,+91]mm · 2 of 30 slices shown (1 of 2)]
[im 1/30]
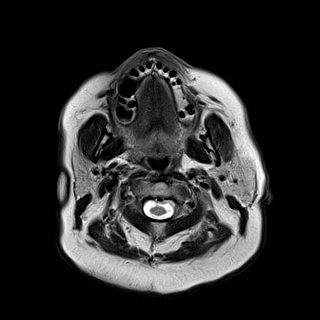
[im 30/30]
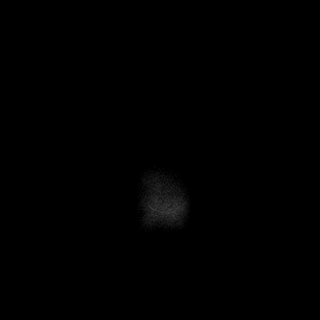

[Series 11: FLAIR · axial · 5.0mm · 0.49mm/px · z∈[-81,+92]mm · 2 of 30 slices shown]
[im 1/30]
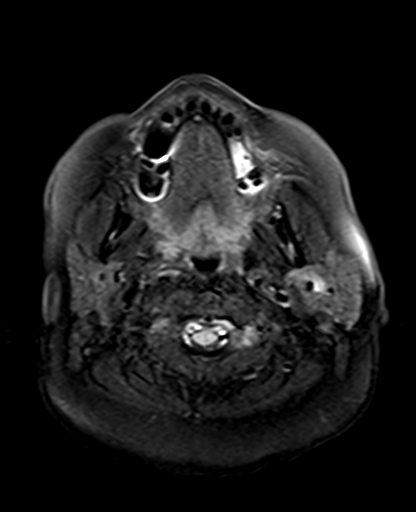
[im 30/30]
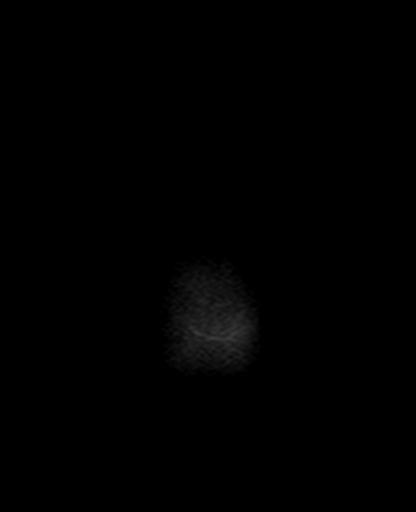

[Series 12: mag_images · axial · 3.0mm · 0.98mm/px · z∈[-83,+94]mm · 4 of 60 slices shown]
[im 1/60]
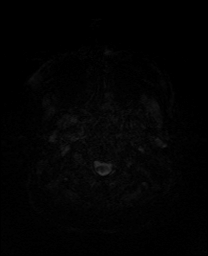
[im 20/60]
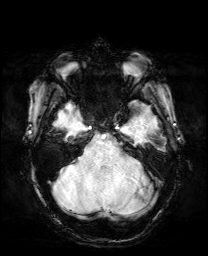
[im 40/60]
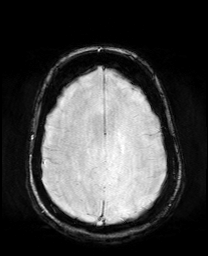
[im 60/60]
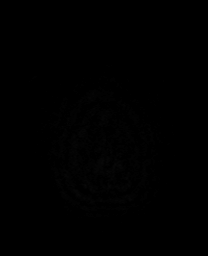

[Series 13: pha_images · axial · 3.0mm · 0.98mm/px · z∈[-83,+94]mm · 4 of 60 slices shown]
[im 1/60]
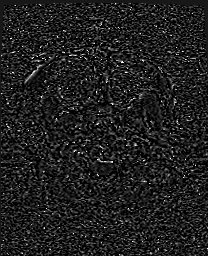
[im 20/60]
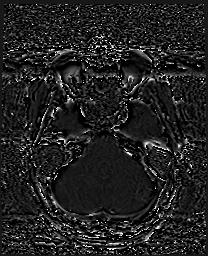
[im 40/60]
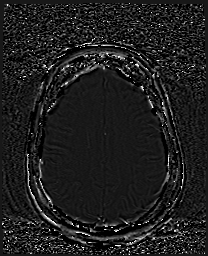
[im 60/60]
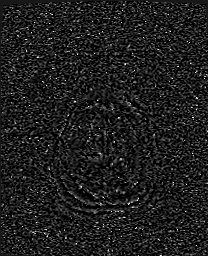

[Series 14: swi_images · axial · 3.0mm · 0.98mm/px · z∈[-83,+94]mm · 4 of 60 slices shown]
[im 1/60]
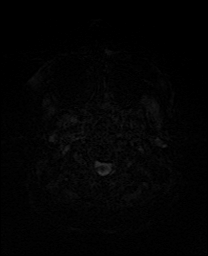
[im 20/60]
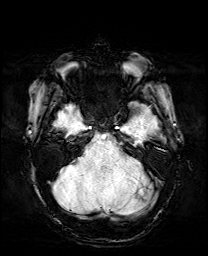
[im 40/60]
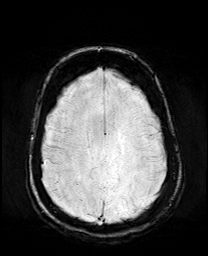
[im 60/60]
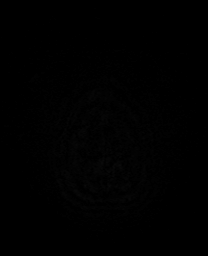

[Series 15: mip_images(sw) · axial · 24.0mm · 0.98mm/px · z∈[-72,+83]mm · 4 of 53 slices shown]
[im 1/53]
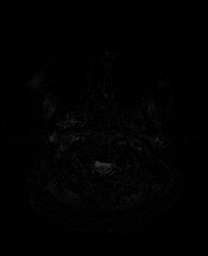
[im 18/53]
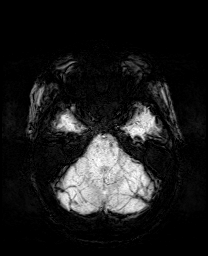
[im 35/53]
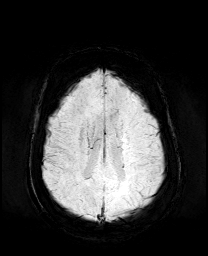
[im 53/53]
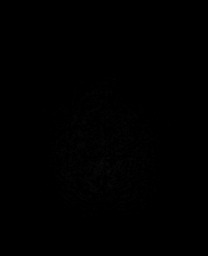

[Series 17: T2 · coronal · 5.0mm · 0.34mm/px · 2 of 35 slices shown (2 of 2)]
[im 1/35]
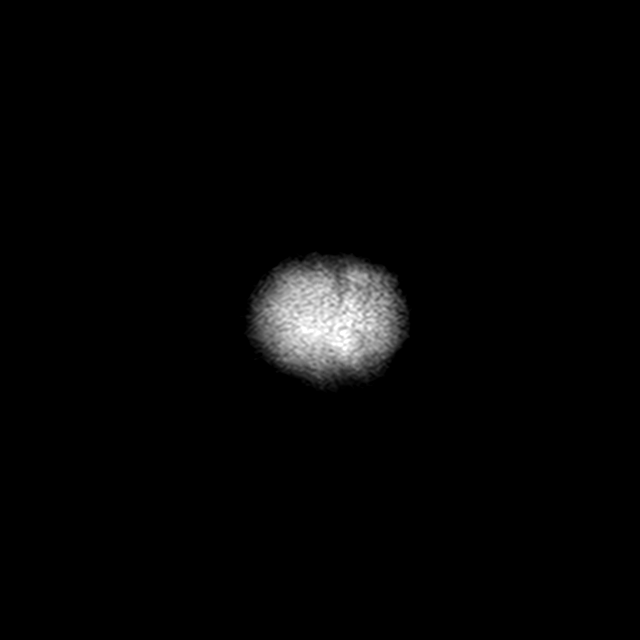
[im 35/35]
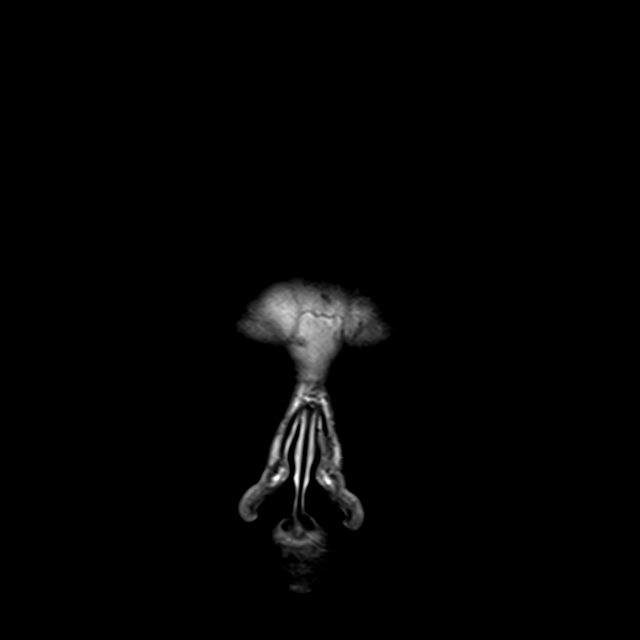

[44 of 48 positions shown; findings below may reference images not displayed]

FINDINGS: Brain: Cerebral volume within normal limits. Patchy and hazy
T2/FLAIR hyperintensity involving the periventricular deep white
matter both cerebral hemispheres most consistent with chronic small
vessel ischemic disease. Mild patchy involvement of the pons. Few
tiny remote bilateral cerebellar infarcts noted. Interval evolution
of previously identified left PCA territory infarct involving the
medial left occipital lobe, now chronic in appearance. Small amount
of chronic hemosiderin staining present at this location.

No abnormal foci of restricted diffusion to suggest acute or
subacute ischemia. Gray-white matter differentiation otherwise
maintained. No other areas of chronic cortical infarction. No other
acute or chronic intracranial blood products.

No mass lesion, midline shift or mass effect no hydrocephalus or
extra-axial fluid collection. Pituitary gland suprasellar region
within normal limits.

Vascular: Major intracranial vascular flow voids are maintained.

Skull and upper cervical spine: Craniocervical junction within
normal limits. Bone marrow signal intensity normal. No scalp soft
tissue abnormality.

Sinuses/Orbits: Globes orbital soft tissues within normal limits.
Paranasal sinuses are clear. No significant mastoid effusion.

Other: None.
IMPRESSION: 1. No acute intracranial abnormality.
2. Interval evolution of previously identified left PCA territory
infarct, now chronic in appearance.
3. Underlying chronic microvascular ischemic disease, with a few
additional small remote bilateral cerebellar infarcts.

## 2021-04-04 MED ORDER — IOHEXOL 350 MG/ML SOLN
75.0000 mL | Freq: Once | INTRAVENOUS | Status: AC | PRN
  Administered 2021-04-04: 75 mL via INTRAVENOUS

## 2021-04-04 NOTE — Code Documentation (Signed)
Stroke Response Nurse Documentation ?Code Documentation ? ?Shannon [name] is a 71 y.o. female arriving to [address] [facility]  via [employer] on 3/30 with past medical hx of CVA, HTN, HLD, OSA with CPAP, afib. On Eliquis (apixaban) daily. Code stroke was activated by ED.  ? ?Patient from home where she was LKW at [place] and now complaining of right sided vision difficulties and left facial droop.  ? ?Stroke team at the bedside on patient arrival. Labs drawn and patient cleared for CT by Shannon Pugh PA. Patient to CT with team. NIHSS 1, see documentation for details and code stroke times. Patient with right hemianopia on exam. The following imaging was completed:  CT Head and CTA. Patient is not a candidate for IV Thrombolytic due to Eliquis. Patient is not not a candidate for IR due to No LVO.  ? ?Bedside handoff with ED RN Shannon [name].   ? ?Shannon [name]  ?Rapid Response RN ? ? ?
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
## 2021-04-04 NOTE — ED Triage Notes (Addendum)
Patient here with possible stroke symptoms.  Patient states she felt like something ran across her right foot, then she is having some visual disturbances in the right eye.  She states that she is having some "cloudy" film in eye.  LSN at 1900.  Patient does have slight facial droop on left.  Patient on eliquis.   ?

## 2021-04-04 NOTE — ED Notes (Signed)
Patient transported to MRI 

## 2021-04-04 NOTE — ED Provider Notes (Signed)
?[facility] ?Provider Note ? ? ?CSN: [id number] ?Arrival date & time: 04/04/21  2027 ? ?  ? ?History ? ?Chief Complaint  ?Patient presents with  ? Stroke Symptoms  ? ? ?Shannon [name] is a 71 y.o. female.  With past medical history of hypertension, chronic diastolic heart failure, atrial fibrillation, recent CVA who presents to the emergency department as code stroke. ? ?Last known well 5 PM.  ? ?Patient states that she laid down to take a nap at 5 PM this evening.  She states that she woke up at 7 and noticed that her vision in the right eye was cloudy and fuzzy.  She states that this happened in her previous stroke which was last month.  She states that since her previous stroke she felt like her vision was getting better and then acutely got worse today.  She does note that yesterday she felt like her right eyelid was "droopy," but her vision was not cloudy.  She also endorses daily headaches since her previous stroke that is behind her eyes.  She is anticoagulated on Eliquis and took this this morning. ? ?HPI ? ?  ? ?Home Medications ?Prior to Admission medications   ?Medication Sig Start Date End Date Taking? Authorizing Provider  ?acetaminophen (TYLENOL) 650 MG CR tablet Take 650-1,300 mg by mouth every 8 (eight) hours as needed for pain.     [provider]  ?albuterol (VENTOLIN HFA) 108 (90 Base) MCG/ACT inhaler Inhale 1-2 puffs into the lungs every 6 (six) hours as needed for wheezing or shortness of breath. 12/23/19   Parrett, Fonnie Mu, NP  ?amLODipine (NORVASC) 5 MG tablet Take 1 tablet (5 mg total) by mouth daily. 03/13/21   Baldwin Jamaica, PA-C  ?apixaban (ELIQUIS) 5 MG TABS tablet Take 1 tablet (5 mg total) by mouth 2 (two) times daily. 02/08/21   Maximiano Coss, NP  ?Carboxymethylcellulose Sodium (DRY EYE RELIEF OP) Apply 1 drop to eye as needed ([place]ry[place]).    [provider]  ?carvedilol (COREG) 6.25 MG tablet Take 12.5 mg by mouth 2 (two)  times daily with a meal.    [provider]  ?Cholecalciferol (VITAMIN D) 50 MCG (2000 UT) CAPS Take 2,000 Units by mouth daily.    [provider]  ?DULoxetine (CYMBALTA) 30 MG capsule Take 1 capsule (30 mg total) by mouth daily. 10/04/20   Maximiano Coss, NP  ?flecainide (TAMBOCOR) 50 MG tablet Take 1 tablet (50 mg total) by mouth as needed. ?Patient taking differently: Take 50 mg by mouth as needed (AFIB). 01/04/21   Allred, Jeneen Rinks, MD  ?Hydrocortisone (TDSKAJGOT-15 EX) Apply 1 application topically as needed (Dry Skin).    [provider]  ?loratadine (CLARITIN) 10 MG tablet Take 10 mg by mouth at bedtime.    [provider]  ?LORazepam (ATIVAN) 0.5 MG tablet Take 1 tablet (0.5 mg total) by mouth every 8 (eight) hours as needed for anxiety. 04/01/21   Maximiano Coss, NP  ?Na Sulfate-K Sulfate-Mg Sulf (SUPREP BOWEL PREP KIT) 17.5-3.13-1.6 GM/177ML SOLN Take 1 kit by mouth as directed. For colonoscopy prep 01/31/21   Willia Craze, NP  ?rosuvastatin (CRESTOR) 10 MG tablet Take 1 tablet (10 mg total) by mouth daily. 02/08/21   Maximiano Coss, NP  ?triamcinolone (NASACORT) 55 MCG/ACT AERO nasal inhaler Place 2 sprays into the nose daily.    [provider]  ?   ? ?Allergies    ?Ace inhibitors, Elemental sulfur, Hctz [hydrochlorothiazide],  Oxycodone, Prednisone, Sulfa antibiotics, and Voltaren [diclofenac sodium]   ? ?Review of Systems   ?Review of Systems  ?Eyes:  Positive for visual disturbance.  ?All other systems reviewed and are negative. ? ?Physical Exam ?Updated Vital Signs ?BP (!) 183/70   Pulse 75   Temp 98.7 ?F (37.1 ?C) (Oral)   Resp (!) 25   Wt 101.6 kg   SpO2 98%   BMI 35.08 kg/m?  ?Physical Exam ?Vitals and nursing note reviewed.  ?Constitutional:   ?   General: She is not in acute distress. ?   Appearance: Normal appearance. She is not ill-appearing or toxic-appearing.  ?HENT:  ?   Head: Normocephalic and atraumatic.  ?   Nose: Nose normal.  ?    Mouth/Throat:  ?   Mouth: Mucous membranes are moist.  ?   Pharynx: Oropharynx is clear.  ?Eyes:  ?   General: Visual field deficit present. No scleral icterus.    ?   Right eye: No discharge.     ?   Left eye: No discharge.  ?   Extraocular Movements: Extraocular movements intact.  ?   Conjunctiva/sclera: Conjunctivae normal.  ?   Pupils: Pupils are equal, round, and reactive to light.  ?Cardiovascular:  ?   Rate and Rhythm: Normal rate and regular rhythm.  ?   Heart sounds: No murmur heard. ?Pulmonary:  ?   Effort: Pulmonary effort is normal. No respiratory distress.  ?   Breath sounds: Normal breath sounds.  ?Abdominal:  ?   General: Bowel sounds are normal.  ?   Palpations: Abdomen is soft.  ?Musculoskeletal:     ?   General: Normal range of motion.  ?   Cervical back: Normal range of motion and neck supple.  ?Skin: ?   General: Skin is warm and dry.  ?   Capillary Refill: Capillary refill takes less than 2 seconds.  ?   Findings: No rash.  ?Neurological:  ?   Mental Status: She is alert and oriented to person, place, and time.  ?   GCS: GCS eye subscore is 4. GCS verbal subscore is 5. GCS motor subscore is 6.  ?   Cranial Nerves: No dysarthria or facial asymmetry.  ?   Sensory: No sensory deficit.  ?   Motor: Motor function is intact. No weakness or pronator drift.  ?   Coordination: Coordination is intact. Coordination normal.  ?   Comments: Right homonymous superior quadrantanopia  ?Psychiatric:     ?   Mood and Affect: Mood normal.     ?   Behavior: Behavior normal.     ?   Thought Content: Thought content normal.     ?   Judgment: Judgment normal.  ? ? ?ED Results / Procedures / Treatments   ?Labs ?(all labs ordered are listed, but only abnormal results are displayed) ?Labs Reviewed  ?COMPREHENSIVE METABOLIC PANEL - Abnormal; Notable for the following components:  ?    Result Value  ? Glucose, Bld 104 (*)   ? All other components within normal limits  ?URINALYSIS, ROUTINE W REFLEX MICROSCOPIC - Abnormal;  Notable for the following components:  ? Color, Urine COLORLESS (*)   ? All other components within normal limits  ?I-STAT CHEM 8, ED - Abnormal; Notable for the following components:  ? Glucose, Bld 101 (*)   ? All other components within normal limits  ?RESP PANEL BY RT-PCR (FLU A&B, COVID) ARPGX2  ?ETHANOL  ?PROTIME-INR  ?APTT  ?  CBC  ?DIFFERENTIAL  ?RAPID URINE DRUG SCREEN, HOSP PERFORMED  ?VITAMIN B12  ?FOLATE  ?CBG MONITORING, ED  ? ?EKG ?None ? ?Radiology ?CUP PACEART REMOTE DEVICE CHECK ? ?Result Date: 04/03/2021 ?[name] summary report received. Battery status OK. Normal device function. No new symptom, tachy, brady, or pause episodes. No new AF episodes. Monthly summary reports and ROV/PRN LA ? ?CT HEAD CODE STROKE WO CONTRAST ? ?Result Date: 04/04/2021 ?CLINICAL DATA:  Code stroke. Neuro deficit, stroke suspected. Abnormal sensation in the right foot. Right eye visual disturbance. Slight left facial droop. EXAM: CT HEAD WITHOUT CONTRAST TECHNIQUE: Contiguous axial images were obtained from the base of the skull through the vertex without intravenous contrast. RADIATION DOSE REDUCTION: This exam was performed according to the departmental dose-optimization program which includes automated exposure control, adjustment of the mA and/or kV according to patient size and/or use of iterative reconstruction technique. COMPARISON:  CT head without contrast and MR head without contrast 02/06/2021 FINDINGS: Brain: Expected evolution of the medial left occipital lobe infarct is noted. No acute infarct, hemorrhage, or mass lesion is present. Mild atrophy and white matter changes are otherwise stable. Basal ganglia are intact. Insular ribbon is normal. The brainstem and cerebellum are within normal limits. Vascular: No hyperdense vessel or unexpected calcification. Skull: Calvarium is intact. No focal lytic or blastic lesions are present. No significant extracranial soft tissue lesion is present. Sinuses/Orbits: The paranasal  sinuses and mastoid air cells are clear. The globes and orbits are within normal limits. Other: ASPECTS ([place] Stroke Program Early CT Score) - Ganglionic level infarction (caudate, lentiform nuclei, internal c

## 2021-04-05 DIAGNOSIS — I639 Cerebral infarction, unspecified: Secondary | ICD-10-CM | POA: Diagnosis not present

## 2021-04-05 DIAGNOSIS — I6782 Cerebral ischemia: Secondary | ICD-10-CM | POA: Diagnosis not present

## 2021-04-05 MED ORDER — APIXABAN 5 MG PO TABS
5.0000 mg | ORAL_TABLET | Freq: Once | ORAL | Status: AC
  Administered 2021-04-05: 5 mg via ORAL
  Filled 2021-04-05: qty 1

## 2021-04-05 NOTE — ED Provider Notes (Signed)
?  Provider Note ?MRN:  [id number]  ?Arrival date & time: 04/05/21    ?ED Course and Medical Decision Making  ?Assumed care from PA Autry at shift change. ? ?Code stroke, acute vision loss of right upper quadrant bilaterally, awaiting MRI.  Per neurology, may be a candidate for discharge if reassuring. ? ?MRI negative, work-up reassuring, appropriate for discharge. ? ?Procedures ? ?Final Clinical Impressions(s) / ED Diagnoses  ? ?  ICD-10-CM   ?1. Visual disturbance  H53.9   ?  ?  ?ED Discharge Orders   ? ? None  ? ?  ?  ? ? ?Discharge Instructions   ? ?  ?You were evaluated in the Emergency Department and after careful evaluation, we did not find any emergent condition requiring admission or further testing in the hospital. ? ?Your exam/testing today was overall reassuring.  MRI does not show evidence of stroke.  Recommend continued follow-up with your regular doctors. ? ?Please return to the Emergency Department if you experience any worsening of your condition.  Thank you for allowing [place] to be a part of your care. ? ? ? ? ? ? ?Barth Kirks. Sedonia Small, MD ?[facility] Emergency Medicine ?[email or web address] ? ?  ?Maudie Flakes, MD ?04/05/21 [phone] ? ?

## 2021-04-05 NOTE — Discharge Instructions (Signed)
You were evaluated in the Emergency Department and after careful evaluation, we did not find any emergent condition requiring admission or further testing in the hospital. ? ?Your exam/testing today was overall reassuring.  MRI does not show evidence of stroke.  Recommend continued follow-up with your regular doctors. ? ?Please return to the Emergency Department if you experience any worsening of your condition.  Thank you for allowing [place] to be a part of your care. ? ?

## 2021-04-05 NOTE — ED Notes (Signed)
Patient verbalizes understanding of d/c instructions. Opportunities for questions and answers were provided. Pt d/c from ED and ambulated with husband to lobby. ?

## 2021-04-10 ENCOUNTER — Ambulatory Visit (INDEPENDENT_AMBULATORY_CARE_PROVIDER_SITE_OTHER): Payer: Medicare Other | Admitting: Cardiology

## 2021-04-10 ENCOUNTER — Encounter: Payer: Self-pay | Admitting: Cardiology

## 2021-04-10 VITALS — BP 142/80 | HR 65 | Ht 67.0 in | Wt 223.0 lb

## 2021-04-10 DIAGNOSIS — I10 Essential (primary) hypertension: Secondary | ICD-10-CM

## 2021-04-10 DIAGNOSIS — I480 Paroxysmal atrial fibrillation: Secondary | ICD-10-CM | POA: Diagnosis not present

## 2021-04-10 MED ORDER — CARVEDILOL 25 MG PO TABS: 25.0000 mg | ORAL_TABLET | Freq: Two times a day (BID) | ORAL | 3 refills | Status: DC
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
## 2021-04-10 NOTE — Patient Instructions (Addendum)
Medications: ?Increase your Carvedilol to 25 mg two times a day ?Your physician recommends that you continue on your current medications as directed. Please refer to the Current Medication list given to you today. ?*If you need a refill on your cardiac medications before your next appointment, please call your pharmacy* ? ?Lab Work: ?None. ?If you have labs (blood work) drawn today and your tests are completely normal, you will receive your results only by: ?MyChart Message (if you have MyChart) OR ?A paper copy in the mail ?If you have any lab test that is abnormal or we need to change your treatment, we will call you to review the results. ? ?Testing/Procedures: ?None. ? ?Follow-Up: ?At [facility], you and your health needs are our priority.  As part of our continuing mission to provide you with exceptional heart care, we have created designated Provider Care Teams.  These Care Teams include your primary Cardiologist (physician) and Advanced Practice Providers (APPs -  Physician Assistants and Nurse Practitioners) who all work together to provide you with the care you need, when you need it. ? ?Your physician wants you to follow-up in: 6 months with Lars Mage  ? ?We recommend signing up for the patient portal called "MyChart".  Sign up information is provided on this After Visit Summary.  MyChart is used to connect with patients for Virtual Visits (Telemedicine).  Patients are able to view lab/test results, encounter notes, upcoming appointments, etc.  Non-urgent messages can be sent to your provider as well.   ?To learn more about what you can do with MyChart, go to [email or web address].   ? ?Any Other Special Instructions Will Be Listed Below (If Applicable).  ?

## 2021-04-10 NOTE — Progress Notes (Signed)
Carelink Summary Report / [email or web address] 

## 2021-04-11 DIAGNOSIS — H53461 Homonymous bilateral field defects, right side: Secondary | ICD-10-CM | POA: Diagnosis not present

## 2021-04-11 DIAGNOSIS — I69398 Other sequelae of cerebral infarction: Secondary | ICD-10-CM | POA: Diagnosis not present

## 2021-04-11 DIAGNOSIS — H4912 Fourth [trochlear] nerve palsy, left eye: Secondary | ICD-10-CM | POA: Diagnosis not present

## 2021-04-11 DIAGNOSIS — Z79899 Other long term (current) drug therapy: Secondary | ICD-10-CM | POA: Diagnosis not present

## 2021-04-15 ENCOUNTER — Other Ambulatory Visit: Payer: Self-pay | Admitting: Registered Nurse

## 2021-04-15 DIAGNOSIS — I639 Cerebral infarction, unspecified: Secondary | ICD-10-CM

## 2021-04-16 ENCOUNTER — Other Ambulatory Visit (INDEPENDENT_AMBULATORY_CARE_PROVIDER_SITE_OTHER): Payer: Medicare Other

## 2021-04-16 DIAGNOSIS — Z79899 Other long term (current) drug therapy: Secondary | ICD-10-CM

## 2021-04-16 DIAGNOSIS — D649 Anemia, unspecified: Secondary | ICD-10-CM

## 2021-04-16 DIAGNOSIS — R5382 Chronic fatigue, unspecified: Secondary | ICD-10-CM

## 2021-04-16 LAB — CBC WITH DIFFERENTIAL/PLATELET
Basophils Absolute: 0.1 10*3/uL (ref 0.0–0.1)
Basophils Relative: 1.2 % (ref 0.0–3.0)
Eosinophils Absolute: 0.3 10*3/uL (ref 0.0–0.7)
Eosinophils Relative: 4.7 % (ref 0.0–5.0)
HCT: 34.4 % — ABNORMAL LOW (ref 36.0–46.0)
Hemoglobin: 11.3 g/dL — ABNORMAL LOW (ref 12.0–15.0)
Lymphocytes Relative: 22.3 % (ref 12.0–46.0)
Lymphs Abs: 1.5 10*3/uL (ref 0.7–4.0)
MCHC: 33 g/dL (ref 30.0–36.0)
MCV: 88.1 fl (ref 78.0–100.0)
Monocytes Absolute: 0.5 10*3/uL (ref 0.1–1.0)
Monocytes Relative: 7.8 % (ref 3.0–12.0)
Neutro Abs: 4.4 10*3/uL (ref 1.4–7.7)
Neutrophils Relative %: 64 % (ref 43.0–77.0)
Platelets: 264 10*3/uL (ref 150.0–400.0)
RBC: 3.9 Mil/uL (ref 3.87–5.11)
RDW: 14.6 % (ref 11.5–15.5)
WBC: 6.8 10*3/uL (ref 4.0–10.5)

## 2021-04-17 ENCOUNTER — Encounter: Payer: Self-pay | Admitting: Registered Nurse

## 2021-04-17 LAB — IRON,TIBC AND FERRITIN PANEL
%SAT: 19 % (calc) (ref 16–45)
Ferritin: 117 ng/mL (ref 16–288)
Iron: 60 ug/dL (ref 45–160)
TIBC: 309 mcg/dL (calc) (ref 250–450)

## 2021-04-23 LAB — EXTRA SPECIMEN

## 2021-04-23 LAB — CK ISOENZYMES

## 2021-04-30 ENCOUNTER — Telehealth: Payer: Self-pay | Admitting: *Deleted

## 2021-04-30 NOTE — Telephone Encounter (Signed)
Recommend continuing Eliquis for 1-2 dental extractions (especially since pt had prior stroke while compliant on Xarelto). Pt does not require SBE ppx. ?

## 2021-04-30 NOTE — Telephone Encounter (Signed)
? ?  Pre-operative Risk Assessment  ?  ?Patient Name: Shannon [name]  ?DOB: [birth year] ?MRN: [id number]  ? ? ? ?Request for Surgical Clearance   ? ?Procedure:  Dental Extraction - Amount of Teeth to be Pulled:  2 ? ?Date of Surgery:  Clearance TBD                              ?   ?Surgeon:   ?Surgeon's Group or Practice Name:  [facility] ?Phone number:  [phone] ?Fax number:  [phone] ?  ?Type of Clearance Requested:   ?- Pharmacy:  Hold Apixaban (Eliquis) NOT INDICATED ?  ?Type of Anesthesia:  Local  ?  ?Additional requests/questions:   ? ?Signed, ?Jeanann Lewandowsky   ?04/30/2021, 8:55 AM  ? ?

## 2021-05-01 ENCOUNTER — Other Ambulatory Visit: Payer: Self-pay | Admitting: Registered Nurse

## 2021-05-01 DIAGNOSIS — I639 Cerebral infarction, unspecified: Secondary | ICD-10-CM

## 2021-05-02 DIAGNOSIS — I10 Essential (primary) hypertension: Secondary | ICD-10-CM | POA: Diagnosis not present

## 2021-05-02 DIAGNOSIS — G4733 Obstructive sleep apnea (adult) (pediatric): Secondary | ICD-10-CM | POA: Diagnosis not present

## 2021-05-06 ENCOUNTER — Ambulatory Visit (INDEPENDENT_AMBULATORY_CARE_PROVIDER_SITE_OTHER): Payer: Medicare Other

## 2021-05-06 DIAGNOSIS — I480 Paroxysmal atrial fibrillation: Secondary | ICD-10-CM

## 2021-05-07 LAB — CUP PACEART REMOTE DEVICE CHECK
Date Time Interrogation Session: 20230502084809
Implantable Pulse Generator Implant Date: 20210301

## 2021-05-13 ENCOUNTER — Ambulatory Visit (INDEPENDENT_AMBULATORY_CARE_PROVIDER_SITE_OTHER): Payer: Medicare Other | Admitting: Registered Nurse

## 2021-05-13 ENCOUNTER — Other Ambulatory Visit: Payer: Self-pay

## 2021-05-13 ENCOUNTER — Encounter: Payer: Self-pay | Admitting: Registered Nurse

## 2021-05-13 VITALS — BP 115/58 | HR 60 | Temp 98.3°F | Resp 18 | Ht 67.0 in | Wt 222.6 lb

## 2021-05-13 DIAGNOSIS — Z79899 Other long term (current) drug therapy: Secondary | ICD-10-CM | POA: Diagnosis not present

## 2021-05-13 DIAGNOSIS — D649 Anemia, unspecified: Secondary | ICD-10-CM

## 2021-05-13 DIAGNOSIS — F32A Depression, unspecified: Secondary | ICD-10-CM

## 2021-05-13 DIAGNOSIS — I10 Essential (primary) hypertension: Secondary | ICD-10-CM

## 2021-05-13 DIAGNOSIS — E559 Vitamin D deficiency, unspecified: Secondary | ICD-10-CM

## 2021-05-13 DIAGNOSIS — F419 Anxiety disorder, unspecified: Secondary | ICD-10-CM

## 2021-05-13 DIAGNOSIS — R5382 Chronic fatigue, unspecified: Secondary | ICD-10-CM

## 2021-05-13 LAB — CBC WITH DIFFERENTIAL/PLATELET
Basophils Absolute: 0.1 10*3/uL (ref 0.0–0.1)
Basophils Relative: 0.9 % (ref 0.0–3.0)
Eosinophils Absolute: 0.3 10*3/uL (ref 0.0–0.7)
Eosinophils Relative: 3.3 % (ref 0.0–5.0)
HCT: 36.7 % (ref 36.0–46.0)
Hemoglobin: 12.2 g/dL (ref 12.0–15.0)
Lymphocytes Relative: 19.2 % (ref 12.0–46.0)
Lymphs Abs: 1.5 10*3/uL (ref 0.7–4.0)
MCHC: 33.3 g/dL (ref 30.0–36.0)
MCV: 87.9 fl (ref 78.0–100.0)
Monocytes Absolute: 0.7 10*3/uL (ref 0.1–1.0)
Monocytes Relative: 9.2 % (ref 3.0–12.0)
Neutro Abs: 5.3 10*3/uL (ref 1.4–7.7)
Neutrophils Relative %: 67.4 % (ref 43.0–77.0)
Platelets: 287 10*3/uL (ref 150.0–400.0)
RBC: 4.17 Mil/uL (ref 3.87–5.11)
RDW: 15.2 % (ref 11.5–15.5)
WBC: 7.9 10*3/uL (ref 4.0–10.5)

## 2021-05-13 LAB — COMPREHENSIVE METABOLIC PANEL
ALT: 15 U/L (ref 0–35)
AST: 16 U/L (ref 0–37)
Albumin: 4.2 g/dL (ref 3.5–5.2)
Alkaline Phosphatase: 81 U/L (ref 39–117)
BUN: 13 mg/dL (ref 6–23)
CO2: 31 mEq/L (ref 19–32)
Calcium: 9.2 mg/dL (ref 8.4–10.5)
Chloride: 101 mEq/L (ref 96–112)
Creatinine, Ser: 0.77 mg/dL (ref 0.40–1.20)
GFR: 78.43 mL/min (ref >60.00)
Glucose, Bld: 102 mg/dL — ABNORMAL HIGH (ref 70–99)
Potassium: 4.1 mEq/L (ref 3.5–5.1)
Sodium: 139 mEq/L (ref 135–145)
Total Bilirubin: 0.4 mg/dL (ref 0.2–1.2)
Total Protein: 7 g/dL (ref 6.0–8.3)

## 2021-05-13 LAB — LIPID PANEL
Cholesterol: 110 mg/dL (ref 0–200)
HDL: 47.4 mg/dL (ref >39.00)
LDL Cholesterol: 43 mg/dL (ref 0–99)
NonHDL: 62.23
Total CHOL/HDL Ratio: 2
Triglycerides: 97 mg/dL (ref 0.0–149.0)
VLDL: 19.4 mg/dL (ref 0.0–40.0)

## 2021-05-13 LAB — HEMOGLOBIN A1C: Hgb A1c MFr Bld: 5.6 % (ref 4.6–6.5)

## 2021-05-13 MED ORDER — FLUOXETINE HCL 20 MG PO CAPS: 20.0000 mg | ORAL_CAPSULE | Freq: Every day | ORAL | 3 refills | Status: DC

## 2021-05-13 NOTE — Patient Instructions (Addendum)
Mrs. Mcelhiney - ? ?Always great to see you ? ?Stop cymbalta, start fluoxetine ? ?Check in in 3 months ? ?Call sooner if you need anything ? ?Thank you ? ?Rich  ? ? ? ?If you have lab work done today you will be contacted with your lab results within the next 2 weeks.  If you have not heard from [place] then please contact us. The fastest way to get your results is to register for My Chart. ? ? ?IF you received an x-ray today, you will receive an invoice from [facility]. Please contact [facility] at [phone] with questions or concerns regarding your invoice.  ? ?IF you received labwork today, you will receive an invoice from [facility]. Please contact [facility] at [phone] with questions or concerns regarding your invoice.  ? ?Our billing staff will not be able to assist you with questions regarding bills from these companies. ? ?You will be contacted with the lab results as soon as they are available. The fastest way to get your results is to activate your My Chart account. Instructions are located on the last page of this paperwork. If you have not heard from [place] regarding the results in 2 weeks, please contact this office. ?  ? ? ?

## 2021-05-13 NOTE — Progress Notes (Signed)
? ?Established Patient Office Visit ? ?Subjective:  ?Patient ID: Shannon [name], female    DOB: [birth year]  Age: 71 y.o. MRN: [id number] ? ?CC:  ?Chief Complaint  ?Patient presents with  ? Follow-up  ?  3 month follow up.  ? ? ?HPI ?Shannon [name] presents for follow up  ? ?Hypertension: ?Patient Currently taking: carvedilol 25mg  po bid ac, amlodipine 5mg  po qd ?Good effect. No AEs. ?Denies CV symptoms including: chest pain, shob, doe, headache, visual changes, fatigue, claudication, and dependent edema.  ? ?Previous readings and labs: ?BP Readings from Last 3 Encounters:  ?05/13/21 (!) 115/58  ?04/10/21 (!) 142/80  ?04/05/21 (!) 146/72  ? ?Lab Results  ?Component Value Date  ? CREATININE 0.70 04/04/2021  ? ? ?On Statin Therapy ?Lab Results  ?Component Value Date  ? CHOL 182 02/07/2021  ? HDL 45 02/07/2021  ? LDLCALC 117 (H) 02/07/2021  ? LDLDIRECT 106.0 07/10/2020  ? TRIG 101 02/07/2021  ? CHOLHDL 4.0 02/07/2021  ?Rosuvastatin 10mg  po qd ?Good effect, no AE ?Hopes to continue ? ?Depression and Anxiety ?Cymbalta 30mg  po qd, lorazepam 0.5mg  po qd tid prn ?Good effect, but she has felt more down since recent CVA ?No hi/si.  ?Outpatient Medications Prior to Visit  ?Medication Sig Dispense Refill  ? acetaminophen (TYLENOL) 500 MG tablet Take 1,000 mg by mouth every 6 (six) hours as needed for moderate pain or headache.    ? acetaminophen (TYLENOL) 650 MG CR tablet Take 650-1,300 mg by mouth every 8 (eight) hours as needed for pain.     ? albuterol (VENTOLIN HFA) 108 (90 Base) MCG/ACT inhaler Inhale 1-2 puffs into the lungs every 6 (six) hours as needed for wheezing or shortness of breath. 8 g 2  ? amLODipine (NORVASC) 5 MG tablet Take 1 tablet (5 mg total) by mouth daily. 90 tablet 2  ? Carboxymethylcellulose Sodium (DRY EYE RELIEF OP) Apply 1 drop to eye as needed (Dryness).    ? carvedilol (COREG) 25 MG tablet Take 1 tablet (25 mg total) by mouth 2 (two) times daily. 180 tablet 3  ? Cholecalciferol  (VITAMIN D) 50 MCG (2000 UT) CAPS Take 2,000 Units by mouth daily.    ? DULoxetine (CYMBALTA) 30 MG capsule Take 1 capsule (30 mg total) by mouth daily. (Patient taking differently: Take 30 mg by mouth at bedtime.) 90 capsule 3  ? ELIQUIS 5 MG TABS tablet TAKE 1 TABLET BY MOUTH TWICE  DAILY 180 tablet 3  ? flecainide (TAMBOCOR) 50 MG tablet Take 1 tablet (50 mg total) by mouth as needed. (Patient taking differently: Take 50 mg by mouth daily as needed (AFIB).) 90 tablet 3  ? Hydrocortisone (APOLIDCVU-13 EX) Apply 1 application topically as needed (Dry Skin).    ? loratadine (CLARITIN) 10 MG tablet Take 10 mg by mouth daily as needed for allergies.    ? LORazepam (ATIVAN) 0.5 MG tablet Take 1 tablet (0.5 mg total) by mouth every 8 (eight) hours as needed for anxiety. 60 tablet 2  ? Na Sulfate-K Sulfate-Mg Sulf (SUPREP BOWEL PREP KIT) 17.5-3.13-1.6 GM/177ML SOLN Take 1 kit by mouth as directed. For colonoscopy prep 354 mL 0  ? rosuvastatin (CRESTOR) 10 MG tablet Take 1 tablet (10 mg total) by mouth at bedtime. 90 tablet 3  ? triamcinolone (NASACORT) 55 MCG/ACT AERO nasal inhaler Place 1 spray into the nose daily as needed (allergies).    ? ?No facility-administered medications prior to visit.  ? ? ?Review of Systems  ?Constitutional:  Negative.   ?HENT: Negative.    ?Eyes: Negative.   ?Respiratory: Negative.    ?Cardiovascular: Negative.   ?Gastrointestinal: Negative.   ?Genitourinary: Negative.   ?Musculoskeletal: Negative.   ?Skin: Negative.   ?Neurological: Negative.   ?Psychiatric/Behavioral: Negative.    ?All other systems reviewed and are negative. ? ?  ?Objective:  ?  ? ?BP (!) 115/58   Pulse 60   Temp 98.3 ?F (36.8 ?C) (Temporal)   Resp 18   Ht _0  (1.702 m)   Wt 222 lb 9.6 oz (101 kg)   SpO2 98%   BMI 34.86 kg/m?  ? ?Wt Readings from Last 3 Encounters:  ?05/13/21 222 lb 9.6 oz (101 kg)  ?04/10/21 223 lb (101.2 kg)  ?04/04/21 224 lb (101.6 kg)  ? ?Physical Exam ?Vitals and nursing note reviewed.   ?Constitutional:   ?   General: She is not in acute distress. ?   Appearance: Normal appearance. She is normal weight. She is not ill-appearing, toxic-appearing or diaphoretic.  ?Cardiovascular:  ?   Rate and Rhythm: Normal rate and regular rhythm.  ?   Heart sounds: Normal heart sounds. No murmur heard. ?  No friction rub. No gallop.  ?Pulmonary:  ?   Effort: Pulmonary effort is normal. No respiratory distress.  ?   Breath sounds: Normal breath sounds. No stridor. No wheezing, rhonchi or rales.  ?Chest:  ?   Chest wall: No tenderness.  ?Skin: ?   General: Skin is warm and dry.  ?Neurological:  ?   General: No focal deficit present.  ?   Mental Status: She is alert and oriented to person, place, and time. Mental status is at baseline.  ?Psychiatric:     ?   Mood and Affect: Mood normal.     ?   Behavior: Behavior normal.     ?   Thought Content: Thought content normal.     ?   Judgment: Judgment normal.  ? ? ?No results found for any visits on 05/13/21. ? ? ? ?The ASCVD Risk score (Arnett DK, et al., 2019) failed to calculate for the following reasons: ?  The patient has a prior MI or stroke diagnosis ? ?  ?Assessment & Plan:  ? ?Problem List Items Addressed This Visit   ? ?  ? Cardiovascular and Mediastinum  ? Essential hypertension  ?  Well controlled. Continue current regimen ? ?  ?  ?  ? Other  ? Anxiety and depression - Primary  ?  Has been on Cymbalta but notes limited effect. Interested in switching. Will stop cymbalta, start fluoxetine 79m daily ?Med check in 8 weeks.  ? ? ?  ?  ? Relevant Medications  ? FLUoxetine (PROZAC) 20 MG capsule  ? Other Relevant Orders  ? CBC with Differential/Platelet  ? Comprehensive metabolic panel  ? Hemoglobin A1c  ? Lipid panel  ? TSH  ? B12 and Folate Panel  ? Vitamin D (25 hydroxy)  ? Vitamin D deficiency disease  ?  Labs collected. Will follow up with the patient as warranted. ? ? ?  ?  ? Relevant Orders  ? B12 and Folate Panel  ? Vitamin D (25 hydroxy)  ? Chronic  fatigue  ?  Labs collected. Will follow up with the patient as warranted. ?Likely mixed in etiology, will aim to better control anxiety and depression. Check in 6-8 weeks ? ?  ?  ? Relevant Orders  ? TSH  ? B12 and Folate Panel  ?  Vitamin D (25 hydroxy)  ? ?Other Visit Diagnoses   ? ? Low hemoglobin      ? Relevant Orders  ? CBC with Differential/Platelet  ? On statin therapy      ? Relevant Orders  ? Comprehensive metabolic panel  ? Lipid panel  ? ?  ? ? ?Meds ordered this encounter  ?Medications  ? FLUoxetine (PROZAC) 20 MG capsule  ?  Sig: Take 1 capsule (20 mg total) by mouth daily.  ?  Dispense:  90 capsule  ?  Refill:  3  ?  Order Specific Question:   Supervising Provider  ?  Answer:   Carlota Raspberry, JEFFREY R [[id number]]  ? ? ?Return in about 3 months (around 08/13/2021) for med check.  ? ? ?Maximiano Coss, NP ?

## 2021-05-13 NOTE — Assessment & Plan Note (Signed)
Has been on Cymbalta but notes limited effect. Interested in switching. Will stop cymbalta, start fluoxetine '20mg'$  daily ?Med check in 8 weeks.  ? ?

## 2021-05-13 NOTE — Assessment & Plan Note (Signed)
Well-controlled.  Continue current regimen. 

## 2021-05-13 NOTE — Assessment & Plan Note (Signed)
Labs collected. Will follow up with the patient as warranted. ?Likely mixed in etiology, will aim to better control anxiety and depression. Check in 6-8 weeks ?

## 2021-05-13 NOTE — Assessment & Plan Note (Signed)
Labs collected. Will follow up with the patient as warranted.  

## 2021-05-14 LAB — VITAMIN D 25 HYDROXY (VIT D DEFICIENCY, FRACTURES): VITD: 52.79 ng/mL (ref 30.00–100.00)

## 2021-05-14 LAB — TSH: TSH: 1.07 u[IU]/mL (ref 0.35–5.50)

## 2021-05-14 LAB — B12 AND FOLATE PANEL
Folate: 17.9 ng/mL (ref >5.9)
Vitamin B-12: 338 pg/mL (ref 211–911)

## 2021-05-17 ENCOUNTER — Ambulatory Visit
Admission: RE | Admit: 2021-05-17 | Discharge: 2021-05-17 | Disposition: A | Discharge status: Home / Self Care | Payer: Medicare Other | Source: Ambulatory Visit | Attending: Registered Nurse | Admitting: Registered Nurse

## 2021-05-17 DIAGNOSIS — M85851 Other specified disorders of bone density and structure, right thigh: Secondary | ICD-10-CM | POA: Diagnosis not present

## 2021-05-17 DIAGNOSIS — E2839 Other primary ovarian failure: Secondary | ICD-10-CM

## 2021-05-17 DIAGNOSIS — Z780 Asymptomatic menopausal state: Secondary | ICD-10-CM | POA: Diagnosis not present

## 2021-05-20 NOTE — Progress Notes (Signed)
Carelink Summary Report / [email or web address] 

## 2021-06-02 ENCOUNTER — Other Ambulatory Visit: Payer: Self-pay | Admitting: Registered Nurse

## 2021-06-02 DIAGNOSIS — I10 Essential (primary) hypertension: Secondary | ICD-10-CM

## 2021-06-06 ENCOUNTER — Other Ambulatory Visit: Payer: Self-pay | Admitting: Physician Assistant

## 2021-06-10 ENCOUNTER — Ambulatory Visit (INDEPENDENT_AMBULATORY_CARE_PROVIDER_SITE_OTHER): Payer: Medicare Other

## 2021-06-10 DIAGNOSIS — I480 Paroxysmal atrial fibrillation: Secondary | ICD-10-CM

## 2021-06-11 LAB — CUP PACEART REMOTE DEVICE CHECK
Date Time Interrogation Session: 20230606112553
Implantable Pulse Generator Implant Date: 20210301

## 2021-06-24 NOTE — Progress Notes (Signed)
Carelink Summary Report / Loop Recorder 

## 2021-07-15 ENCOUNTER — Telehealth: Payer: Self-pay | Admitting: Registered Nurse

## 2021-07-15 ENCOUNTER — Other Ambulatory Visit: Payer: Self-pay

## 2021-07-15 ENCOUNTER — Ambulatory Visit (INDEPENDENT_AMBULATORY_CARE_PROVIDER_SITE_OTHER): Payer: Medicare Other

## 2021-07-15 ENCOUNTER — Telehealth: Payer: Self-pay

## 2021-07-15 DIAGNOSIS — I480 Paroxysmal atrial fibrillation: Secondary | ICD-10-CM | POA: Diagnosis not present

## 2021-07-15 DIAGNOSIS — F32A Depression, unspecified: Secondary | ICD-10-CM

## 2021-07-15 MED ORDER — DULOXETINE HCL 30 MG PO CPEP: 30.0000 mg | ORAL_CAPSULE | Freq: Every day | ORAL | 3 refills | Status: DC

## 2021-07-15 NOTE — Telephone Encounter (Signed)
Patient is calling in requesting a TOC from [facility] to [facility] as she and her husband have recently moved and are now closer to [place]. Okay for transfer?

## 2021-07-15 NOTE — Telephone Encounter (Signed)
Ok to transfer by me. Hope their move went well! Good luck to them.  Rich

## 2021-07-15 NOTE — Telephone Encounter (Signed)
This is fine by me.

## 2021-07-15 NOTE — Telephone Encounter (Signed)
Encourage patient to contact the pharmacy for refills or they can request refills through [facility]  (Please schedule appointment if patient has not been seen in over a year)    WHAT PHARMACY WOULD THEY LIKE THIS SENT TO: [facility] [phone]  MEDICATION NAME & DOSE:duloxetine 30 mg  NOTES/COMMENTS FROM PATIENT: Pt states that she was recommended an anti-depressant, but life situations changed and she wants to stay with current meds. Pt also states that she was supposed to have a follow up, but is currently doing well.  Does she need a med check f/u still? Please advise.      [place] office please notify patient: It takes 48-72 hours to process rx refill requests Ask patient to call pharmacy to ensure rx is ready before heading there.

## 2021-07-15 NOTE — Telephone Encounter (Signed)
I sent in refills for her . I spoke with her and advised she does need to keep her apt to be able to continue getting her medication . I offered the new DR 's at [place] and [place]. She states she will call them

## 2021-07-15 NOTE — Telephone Encounter (Signed)
Patient is scheduled   

## 2021-07-16 LAB — CUP PACEART REMOTE DEVICE CHECK
Date Time Interrogation Session: 20230705144009
Implantable Pulse Generator Implant Date: 20210301

## 2021-07-29 ENCOUNTER — Encounter: Payer: Self-pay | Admitting: Gastroenterology

## 2021-07-31 DIAGNOSIS — G4733 Obstructive sleep apnea (adult) (pediatric): Secondary | ICD-10-CM | POA: Diagnosis not present

## 2021-07-31 DIAGNOSIS — I10 Essential (primary) hypertension: Secondary | ICD-10-CM | POA: Diagnosis not present

## 2021-08-05 NOTE — Progress Notes (Signed)
Carelink Summary Report / Loop Recorder 

## 2021-08-14 ENCOUNTER — Ambulatory Visit: Payer: Medicare Other | Admitting: Registered Nurse

## 2021-08-19 ENCOUNTER — Ambulatory Visit: Payer: Medicare Other

## 2021-08-21 LAB — CUP PACEART REMOTE DEVICE CHECK
Date Time Interrogation Session: 20230815151024
Implantable Pulse Generator Implant Date: 20210301

## 2021-08-22 ENCOUNTER — Ambulatory Visit (INDEPENDENT_AMBULATORY_CARE_PROVIDER_SITE_OTHER): Payer: Medicare Other | Admitting: Nurse Practitioner

## 2021-08-22 ENCOUNTER — Encounter: Payer: Self-pay | Admitting: Nurse Practitioner

## 2021-08-22 VITALS — BP 108/70 | HR 57 | Temp 96.9°F | Resp 12 | Ht 67.0 in | Wt 215.2 lb

## 2021-08-22 DIAGNOSIS — E559 Vitamin D deficiency, unspecified: Secondary | ICD-10-CM

## 2021-08-22 DIAGNOSIS — I480 Paroxysmal atrial fibrillation: Secondary | ICD-10-CM | POA: Diagnosis not present

## 2021-08-22 DIAGNOSIS — J4540 Moderate persistent asthma, uncomplicated: Secondary | ICD-10-CM

## 2021-08-22 DIAGNOSIS — I10 Essential (primary) hypertension: Secondary | ICD-10-CM

## 2021-08-22 DIAGNOSIS — M8580 Other specified disorders of bone density and structure, unspecified site: Secondary | ICD-10-CM | POA: Diagnosis not present

## 2021-08-22 DIAGNOSIS — I639 Cerebral infarction, unspecified: Secondary | ICD-10-CM

## 2021-08-22 DIAGNOSIS — F419 Anxiety disorder, unspecified: Secondary | ICD-10-CM

## 2021-08-22 DIAGNOSIS — G4733 Obstructive sleep apnea (adult) (pediatric): Secondary | ICD-10-CM

## 2021-08-22 DIAGNOSIS — M353 Polymyalgia rheumatica: Secondary | ICD-10-CM

## 2021-08-22 DIAGNOSIS — E785 Hyperlipidemia, unspecified: Secondary | ICD-10-CM

## 2021-08-22 DIAGNOSIS — F32A Depression, unspecified: Secondary | ICD-10-CM

## 2021-08-22 DIAGNOSIS — D509 Iron deficiency anemia, unspecified: Secondary | ICD-10-CM

## 2021-08-22 NOTE — Assessment & Plan Note (Signed)
Currently using albuterol inhaler as needed basis.  She was on Anoro previously per patient report and did not tolerate medication well so discontinued it.  She is not using albuterol inhaler that often to give parameters of increase in albuterol she is let office know.  Patient acknowledged understood

## 2021-08-22 NOTE — Patient Instructions (Addendum)
Nice to see you today We do not need to check labs today I do recommend that you get the shingles vaccine and the updated covid booster. Check with your neurologist prior to getting these vaccines Follow up in 3 months, sooner if you need me

## 2021-08-22 NOTE — Progress Notes (Signed)
Established Patient Office Visit  Subjective   Patient ID: Shannon [name], female    DOB: April 17, 1950  Age: 71 y.o. MRN: [id number]  Chief Complaint  Patient presents with   Transfer of Care    HPI  CVA: Stroke in [place]. With vision deficit. In the ruq of the eye. Neuroopto at [place]. States still followed by [facility] neuro Dr. Antony Contras.   A-fib: States that she was on xareltto but switched to eliquis. She is also on flecinide. States that she is seeing him every 6 months. Afib clinic   HTN: states that she can check her bP at home Will check it once a month and when she feels off.   OSA: 8-10 hours of sleep. States tha she willwhere the CPAP. Will cause her to break out .  See pulmonary. At least yearly   Osteopenia: Patient recently had DEXA scan in May 2023.  Did show T score of -2.2 patient was taken off of calcium per her report due to the stroke but is vitamin D proficient  PMR: States that she was seeing ortho and had a spinal ablation. Dr Patrick Jupiter did the ablation. [facility]  Iron deficiency: Patient's last CBC was within normal limits   Anxiety: Patient is prescribed lorazepam that she takes very infrequently.  No issues with dizziness per patient report.  Mammo: UTD  DEXA: utd  Colonscopy: Was scheduled but deferred due to patient having a stroke.  Wants to be cleared by neurology prior to pursuing colonoscopy  Cardiology: Dr. Nathen May EP   Neurology: Dr. Caroline More     Review of Systems  Constitutional:  Positive for malaise/fatigue. Negative for chills and fever.  Respiratory:  Negative for shortness of breath.   Cardiovascular:  Positive for leg swelling. Negative for chest pain (squeezing) and palpitations.  Gastrointestinal:        BM dialy  Neurological:  Positive for dizziness (orthostatic). Negative for headaches.  Psychiatric/Behavioral:  Negative for hallucinations and suicidal ideas.       Objective:      BP 108/70   Pulse (!) 57   Temp (!) 96.9 F (36.1 C) (Temporal)   Resp 12   Ht '5\' 7"'$  (1.702 m)   Wt 215 lb 4 oz (97.6 kg)   SpO2 94%   BMI 33.71 kg/m    Physical Exam Vitals and nursing note reviewed.  Constitutional:      Appearance: Normal appearance. She is obese.  HENT:     Right Ear: Tympanic membrane, ear canal and external ear normal.     Left Ear: Tympanic membrane, ear canal and external ear normal.     Mouth/Throat:     Mouth: Mucous membranes are moist.     Pharynx: Oropharynx is clear.  Eyes:     Extraocular Movements: Extraocular movements intact.     Pupils: Pupils are equal, round, and reactive to light.  Cardiovascular:     Rate and Rhythm: Normal rate and regular rhythm.     Pulses: Normal pulses.     Heart sounds: Normal heart sounds.  Pulmonary:     Effort: Pulmonary effort is normal.     Breath sounds: Normal breath sounds.  Musculoskeletal:     Right lower leg: Edema present.     Left lower leg: Edema present.  Lymphadenopathy:     Cervical: No cervical adenopathy.  Skin:    General: Skin is warm.  Neurological:     General: No focal deficit  present.     Mental Status: She is alert.     Deep Tendon Reflexes:     Reflex Scores:      Bicep reflexes are 2+ on the right side and 2+ on the left side.      Patellar reflexes are 2+ on the right side and 2+ on the left side.    Comments: Bilateral upper and lower extremity strength 5/5      No results found for any visits on 08/22/21.    The ASCVD Risk score (Arnett DK, et al., 2019) failed to calculate for the following reasons:   The patient has a prior MI or stroke diagnosis    Assessment & Plan:   Problem List Items Addressed This Visit       Cardiovascular and Mediastinum   Essential hypertension    Patient currently managed on amlodipine, carvedilol.  States they did increase her amlodipine to 10 mg daily and she had a great increase in lower extremity edema.  Back to back down  to 5 mg she is experiencing some what sounds to be dependent edema continue medication as prescribed.      A-fib (HCC)    Paroxysmal in nature.  Patient on carvedilol, flecainide, Eliquis.  She is followed by cardiology continue taking medication as prescribed normal sinus rhythm in office today.  States she can feel when she goes into A-fib.  Follow-up with cardiology as recommended      Acute CVA (cerebrovascular accident) [facility]) - Primary    Patient had acute CVA in January 2023.  She does have deficits of right upper quadrant vision absence.  She has been evaluated by neuro-ophthalmology and is being followed by neurology at [facility] she does see Dr. Caroline More        Respiratory   OSA (obstructive sleep apnea)    Patient currently maintained on CPAP.  Can sleep 8 to 10 hours and tolerates the mask well.  Continue using CPAP as directed follow-up with pulmonology as recommended.      Asthma    Currently using albuterol inhaler as needed basis.  She was on Anoro previously per patient report and did not tolerate medication well so discontinued it.  She is not using albuterol inhaler that often to give parameters of increase in albuterol she is let office know.  Patient acknowledged understood        Musculoskeletal and Integument   Osteopenia    Last DEXA scan 05/25/2021.  Patient vitamin D proficient.  States she was pulled off calcium due to her neurologist.  Repeat DEXA in 2 years        Other   Anxiety and depression    Was thought to have depression and prescribed Prozac which patient ever started.  She does take lorazepam 0.5 mg truly on a as needed basis.  Continue medication as prescribed      Vitamin D deficiency disease    Last vitamin D level in the mid 12s.      Polymyalgia rheumatica (HCC)   Dyslipidemia, goal LDL below 130    Last LDL was below 70 which would be her goal since she had a recent CVA.  Continue taking medication as  prescribed      Iron deficiency anemia    Last CBC within normal limits.       Return in about 3 months (around 11/22/2021) for Recheck on chronic conditions.    Romilda Garret, NP

## 2021-08-22 NOTE — Assessment & Plan Note (Signed)
Last vitamin D level in the mid 55s.

## 2021-08-22 NOTE — Assessment & Plan Note (Signed)
Was thought to have depression and prescribed Prozac which patient ever started.  She does take lorazepam 0.5 mg truly on a as needed basis.  Continue medication as prescribed

## 2021-08-22 NOTE — Assessment & Plan Note (Signed)
Patient currently maintained on CPAP.  Can sleep 8 to 10 hours and tolerates the mask well.  Continue using CPAP as directed follow-up with pulmonology as recommended.

## 2021-08-22 NOTE — Assessment & Plan Note (Signed)
Last CBC within normal limits.

## 2021-08-22 NOTE — Assessment & Plan Note (Signed)
Patient currently managed on amlodipine, carvedilol.  States they did increase her amlodipine to 10 mg daily and she had a great increase in lower extremity edema.  Back to back down to 5 mg she is experiencing some what sounds to be dependent edema continue medication as prescribed.

## 2021-08-22 NOTE — Assessment & Plan Note (Signed)
Patient had acute CVA in January 2023.  She does have deficits of right upper quadrant vision absence.  She has been evaluated by neuro-ophthalmology and is being followed by neurology at [facility] she does see Dr. Caroline More

## 2021-08-22 NOTE — Assessment & Plan Note (Signed)
Paroxysmal in nature.  Patient on carvedilol, flecainide, Eliquis.  She is followed by cardiology continue taking medication as prescribed normal sinus rhythm in office today.  States she can feel when she goes into A-fib.  Follow-up with cardiology as recommended

## 2021-08-22 NOTE — Assessment & Plan Note (Signed)
Last DEXA scan 05/25/2021.  Patient vitamin D proficient.  States she was pulled off calcium due to her neurologist.  Repeat DEXA in 2 years

## 2021-08-22 NOTE — Assessment & Plan Note (Signed)
Last LDL was below 70 which would be her goal since she had a recent CVA.  Continue taking medication as prescribed

## 2021-09-02 ENCOUNTER — Emergency Department (HOSPITAL_COMMUNITY)
Admission: EM | Admit: 2021-09-02 | Discharge: 2021-09-02 | Disposition: A | Discharge status: Home / Self Care | Payer: Medicare Other | Attending: Emergency Medicine | Admitting: Emergency Medicine

## 2021-09-02 ENCOUNTER — Emergency Department (HOSPITAL_COMMUNITY): Payer: Medicare Other

## 2021-09-02 ENCOUNTER — Other Ambulatory Visit: Payer: Self-pay

## 2021-09-02 ENCOUNTER — Encounter (HOSPITAL_COMMUNITY): Payer: Self-pay | Admitting: Emergency Medicine

## 2021-09-02 DIAGNOSIS — J45909 Unspecified asthma, uncomplicated: Secondary | ICD-10-CM | POA: Insufficient documentation

## 2021-09-02 DIAGNOSIS — R778 Other specified abnormalities of plasma proteins: Secondary | ICD-10-CM | POA: Diagnosis not present

## 2021-09-02 DIAGNOSIS — I10 Essential (primary) hypertension: Secondary | ICD-10-CM | POA: Diagnosis not present

## 2021-09-02 DIAGNOSIS — Z87891 Personal history of nicotine dependence: Secondary | ICD-10-CM | POA: Diagnosis not present

## 2021-09-02 DIAGNOSIS — R42 Dizziness and giddiness: Secondary | ICD-10-CM | POA: Diagnosis not present

## 2021-09-02 DIAGNOSIS — R55 Syncope and collapse: Secondary | ICD-10-CM | POA: Diagnosis not present

## 2021-09-02 DIAGNOSIS — Z7901 Long term (current) use of anticoagulants: Secondary | ICD-10-CM | POA: Diagnosis not present

## 2021-09-02 DIAGNOSIS — R079 Chest pain, unspecified: Secondary | ICD-10-CM | POA: Diagnosis not present

## 2021-09-02 DIAGNOSIS — I4891 Unspecified atrial fibrillation: Secondary | ICD-10-CM | POA: Diagnosis not present

## 2021-09-02 DIAGNOSIS — I2510 Atherosclerotic heart disease of native coronary artery without angina pectoris: Secondary | ICD-10-CM | POA: Diagnosis not present

## 2021-09-02 LAB — BASIC METABOLIC PANEL
Anion gap: 8 (ref 5–15)
BUN: 13 mg/dL (ref 8–23)
CO2: 29 mmol/L (ref 22–32)
Calcium: 9.5 mg/dL (ref 8.9–10.3)
Chloride: 105 mmol/L (ref 98–111)
Creatinine, Ser: 0.81 mg/dL (ref 0.44–1.00)
GFR, Estimated: >60 mL/min (ref >60)
Glucose, Bld: 117 mg/dL — ABNORMAL HIGH (ref 70–99)
Potassium: 4.3 mmol/L (ref 3.5–5.1)
Sodium: 142 mmol/L (ref 135–145)

## 2021-09-02 LAB — CBC WITH DIFFERENTIAL/PLATELET
Abs Immature Granulocytes: 0.04 10*3/uL (ref 0.00–0.07)
Basophils Absolute: 0.1 10*3/uL (ref 0.0–0.1)
Basophils Relative: 1 %
Eosinophils Absolute: 0.3 10*3/uL (ref 0.0–0.5)
Eosinophils Relative: 3 %
HCT: 38.9 % (ref 36.0–46.0)
Hemoglobin: 12.5 g/dL (ref 12.0–15.0)
Immature Granulocytes: 0 %
Lymphocytes Relative: 12 %
Lymphs Abs: 1.3 10*3/uL (ref 0.7–4.0)
MCH: 29.5 pg (ref 26.0–34.0)
MCHC: 32.1 g/dL (ref 30.0–36.0)
MCV: 91.7 fL (ref 80.0–100.0)
Monocytes Absolute: 0.7 10*3/uL (ref 0.1–1.0)
Monocytes Relative: 7 %
Neutro Abs: 8.3 10*3/uL — ABNORMAL HIGH (ref 1.7–7.7)
Neutrophils Relative %: 77 %
Platelets: 285 10*3/uL (ref 150–400)
RBC: 4.24 MIL/uL (ref 3.87–5.11)
RDW: 14.1 % (ref 11.5–15.5)
WBC: 10.6 10*3/uL — ABNORMAL HIGH (ref 4.0–10.5)
nRBC: 0 % (ref 0.0–0.2)

## 2021-09-02 LAB — TROPONIN I (HIGH SENSITIVITY)
Troponin I (High Sensitivity): 46 ng/L — ABNORMAL HIGH (ref <18)
Troponin I (High Sensitivity): 42 ng/L — ABNORMAL HIGH (ref <18)

## 2021-09-02 LAB — CBG MONITORING, ED: Glucose-Capillary: 89 mg/dL (ref 70–99)

## 2021-09-02 MED ORDER — ACETAMINOPHEN 325 MG PO TABS
650.0000 mg | ORAL_TABLET | Freq: Once | ORAL | Status: AC
  Administered 2021-09-02: 650 mg via ORAL
  Filled 2021-09-02: qty 2

## 2021-09-02 MED ORDER — MECLIZINE HCL 12.5 MG PO TABS
12.5000 mg | ORAL_TABLET | Freq: Three times a day (TID) | ORAL | 0 refills | Status: DC | PRN
Start: 2021-09-02 — End: 2023-03-07

## 2021-09-02 MED ORDER — CARVEDILOL 12.5 MG PO TABS
25.0000 mg | ORAL_TABLET | Freq: Once | ORAL | Status: AC
  Administered 2021-09-02: 25 mg via ORAL
  Filled 2021-09-02: qty 2
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
## 2021-09-02 NOTE — ED Triage Notes (Addendum)
Patient BIB [facility] from home after having a near syncopal episode, patient felt dizzy and diaphoretic and actually fell ontop of husband in the bed at 0945 this AM.Patient felt nauseated and had a headache on the left side. Patient does have a hx of prior stroke, on eliquis. Patient states all day yesterday she felt like she was in afib.  EMS VS:  BP-180/90 intially then lowered to 160/70 HR 62

## 2021-09-02 NOTE — ED Notes (Signed)
Pt given [place] sandwich bag and ginger ale per Pearline Cables MD.

## 2021-09-02 NOTE — Discharge Instructions (Signed)
It was a pleasure caring for you today in the emergency department.  Please return to the emergency department for any worsening or worrisome symptoms.   Please follow up with your cardiology team in the office

## 2021-09-02 NOTE — ED Provider Triage Note (Signed)
Emergency Medicine Provider Triage Evaluation Note  Shannon [name] , [name] 71 y.o. female  was evaluated in triage.  Pt complains of near syncopal episode onset this morning.  Notes that she felt dizzy and fell onto her husband.  Has [name] history of [name] stroke and is currently on Eliquis.  Patient denies any deficits from her prior stroke.  Patient notes all day yesterday she felt as if she was in [name].  Patient notes residual weakness to her left leg status post pain to the left hip.  Has been going on for [name] well  Review of Systems  Positive: As per HPI Negative:   Physical Exam  BP (!) 182/89 (BP Location: Right Arm)   Pulse 67   Temp 98.4 F (36.9 C) (Oral)   Resp 18   SpO2 98%  Gen:   Awake, no distress   Resp:  Normal effort  MSK:   Moves extremities without difficulty  Other:  Negative pronator drift.  Decreased flexion of left hip secondary to pain.  Otherwise no focal neurological deficits.  Strength sensation intact to bilateral upper and lower extremities.  Medical Decision Making  Medically screening exam initiated at 12:18 PM.  Appropriate orders placed.  Shannon [name] was informed that the remainder of the evaluation will be completed by another provider, this initial triage assessment does not replace that evaluation, and the importance of remaining in the ED until their evaluation is complete.  Work-up initiated   Shannon Hoppes A, PA-C 09/02/21 1220

## 2021-09-07 ENCOUNTER — Other Ambulatory Visit: Payer: Self-pay | Admitting: Internal Medicine

## 2021-09-10 NOTE — Telephone Encounter (Signed)
This is a Public relations account executive pt, Dr. Quentin Ore

## 2021-09-23 ENCOUNTER — Ambulatory Visit (INDEPENDENT_AMBULATORY_CARE_PROVIDER_SITE_OTHER): Payer: Medicare Other

## 2021-09-23 DIAGNOSIS — I480 Paroxysmal atrial fibrillation: Secondary | ICD-10-CM | POA: Diagnosis not present

## 2021-09-26 LAB — CUP PACEART REMOTE DEVICE CHECK
Date Time Interrogation Session: 20230918080411
Implantable Pulse Generator Implant Date: 20210301

## 2021-10-01 ENCOUNTER — Ambulatory Visit: Payer: Medicare Other | Admitting: Adult Health
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
## 2021-10-08 NOTE — Progress Notes (Signed)
Carelink Summary Report / Loop Recorder 

## 2021-10-09 ENCOUNTER — Ambulatory Visit: Payer: Medicare Other | Attending: Cardiology | Admitting: Cardiology

## 2021-10-09 ENCOUNTER — Encounter: Payer: Self-pay | Admitting: Cardiology

## 2021-10-09 VITALS — BP 142/78 | HR 64 | Ht 67.0 in | Wt 216.0 lb

## 2021-10-09 DIAGNOSIS — I10 Essential (primary) hypertension: Secondary | ICD-10-CM | POA: Diagnosis not present

## 2021-10-09 DIAGNOSIS — I5032 Chronic diastolic (congestive) heart failure: Secondary | ICD-10-CM

## 2021-10-09 DIAGNOSIS — I480 Paroxysmal atrial fibrillation: Secondary | ICD-10-CM

## 2021-10-09 DIAGNOSIS — Z01818 Encounter for other preprocedural examination: Secondary | ICD-10-CM

## 2021-10-09 NOTE — Patient Instructions (Signed)
Medication Instructions:  Stop Cymbalta  *If you need a refill on your cardiac medications before your next appointment, please call your pharmacy*   Lab Work: Around Nov 27  Medical Mall Entrance at [facility] 1st desk on the right to check in Lab hours: 7:30 am- 5:30 pm (walk in basis)  If you have labs (blood work) drawn today and your tests are completely normal, you will receive your results only by: MyChart Message (if you have MyChart) OR A paper copy in the mail If you have any lab test that is abnormal or we need to change your treatment, we will call you to review the results.   Testing/Procedures: Your physician has requested that you have cardiac CT. Cardiac computed tomography (CT) is a painless test that uses an x-ray machine to take clear, detailed pictures of your heart. For further information please visit [email or web address]. Please follow instruction sheet as given.  Your physician has recommended that you have an ablation. Catheter ablation is a medical procedure used to treat some cardiac arrhythmias (irregular heartbeats). During catheter ablation, a long, thin, flexible tube is put into a blood vessel in your groin (upper thigh), or neck. This tube is called an ablation catheter. It is then guided to your heart through the blood vessel. Radio frequency waves destroy small areas of heart tissue where abnormal heartbeats may cause an arrhythmia to start. Please see the instruction sheet given to you today.    Follow-Up: At [facility], you and your health needs are our priority.  As part of our continuing mission to provide you with exceptional heart care, we have created designated Provider Care Teams.  These Care Teams include your primary Cardiologist (physician) and Advanced Practice Providers (APPs -  Physician Assistants and Nurse Practitioners) who all work together to provide you with the care you need, when you need it.  We recommend signing up for the  patient portal called "MyChart".  Sign up information is provided on this After Visit Summary.  MyChart is used to connect with patients for Virtual Visits (Telemedicine).  Patients are able to view lab/test results, encounter notes, upcoming appointments, etc.  Non-urgent messages can be sent to your provider as well.   To learn more about what you can do with MyChart, go to [email or web address].    Your next appointment:   See instruction letter.  Other Instructions Tikosyn (Dofetilide) Hospital Admission   Prior to day of admission:  Check with drug insurance company for cost of drug to ensure affordability --- Dofetilide 500 mcg twice a day.  GoodRx is an option if insurance copay is unaffordable.    No Benadryl is allowed 3 days prior to admission.   Please ensure no missed doses of your anticoagulation (blood thinner) for 3 weeks prior to admission. If a dose is missed please notify our office immediately.   A pharmacist will review all your medications for potential interactions with Tikosyn. If any medication changes are needed prior to admission we will be in touch with you.   If any new medications are started AFTER your admission date is set with Nurse, adult. Please notify our office immediately so your medication list can be updated and reviewed by our pharmacist again.  On day of admission:  Tikosyn initiation requires a 3 night/4 day hospital stay with constant telemetry monitoring. You will have an EKG after each dose of Tikosyn as well as daily lab draws.   If the drug does not convert you to  normal rhythm a cardioversion after the 4th dose of Tikosyn.   Afib Clinic office visit on the morning of admission is needed for preliminary labs/ekg.   Time of admission is dependent on bed availability in the hospital. In some instances, you will be sent home until bed is available. Rarely admission can be delayed to the following day if hospital census prevents available beds.    You may bring personal belongings/clothing with you to the hospital. Please leave your suitcase in the car until you arrive in admissions.   Questions please call our office at [place]

## 2021-10-15 ENCOUNTER — Other Ambulatory Visit: Payer: Self-pay | Admitting: Cardiology

## 2021-10-16 NOTE — Progress Notes (Signed)
[facility] [address] [place]. [place] [address] [phone]       OFFICE FOLLOW-UP NOTE  [name] Date of Birth:  05/21/1950 Medical Record Number:  [id number]   Primary neurologist: Dr. Leonie Man Reason for visit: Stroke follow-up  Chief Complaint  Patient presents with   Follow-up stroke    She states she has been having aches maybe from the medication she is taking. She is having a lot of swelling in ankles and feet. She reports having vision loss in right upper quadrant from stroke. Room 2 alone      HPI:   Update 10/17/2021 JM: Patient returns for stroke follow-up after prior visit with Dr. Leonie Man 7 months ago unaccompanied. Reports continued right upper quadrant visual loss from prior stroke, stable since prior visit.  Seen by neuro-ophthalmology Dr. 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  No additional treatment recommended.  She also mentions difficulty multitasking and can feel overwhelmed quickly which has been present since her stroke.  Does report episode back in March where she felt like her vision was worsening, was seen in ED with completed work-up which did not show evidence of new stroke.  She also had a more recent episode in August of acute onset vertigo, was seen in ED and CT head negative for acute stroke, she has had any additional episodes.  Denies new other stroke/TIA symptoms.   She remains on Eliquis and Crestor. Does feel achiness all over body, does get worse in the evening. Questions if this is from Crestor.   Routinely follows with cardiology, she is scheduled for repeat ablation 12/28 with Dr. Quentin Ore. Blood pressure today 140/69 Routinely follows with pulmonology for OSA      History provided for reference purposes only Initial visit 03/27/2021 Dr. Leonie Man: [name] is a 71 year old pleasant Caucasian lady seen today  for initial office follow-up visit following hospital admission for stroke in February 2023.  History is obtained from the patient and review of electronic medical records and opossum reviewed pertinent available imaging films in PACS.  She has past medical history of hypertension, hyperlipidemia, paroxysmal A-fib on long-term anticoagulation with Xarelto, gastroesophageal flux disease, obstructive sleep apnea on CPAP, coronary artery disease who presented on 02/06/2021 with sudden onset of right-sided vision difficulties.  She woke up on the morning with vision not feeling right.  She had some blurred vision in the right upper quadrant and some pressure behind her eyes.  She was seen by ophthalmologist with thought she had some fluid behind her eyes.  On exam she was found to have right superior quadrantanopsia.  CT scan of the head was negative for any acute abnormality.  MRI scan of the brain showed left medial occipital acute infarct.  MR angiogram of the brain showed no significant intracranial large vessel stenosis.  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  MR angiogram of the neck showed no significant large vessel stenosis extracranially.  Patient LDL cholesterol was $RemoveBeforeD'1 1 7 'hevrRJpgKrMERq$ mg percent hemoglobin A1c was 5.7.  Echocardiogram showed ejection fraction of 55 to 60%.  Patient was on Xarelto prior to admission and this after discussion of alternatives and risk benefit was switched to Eliquis.  She remains on Eliquis 5 mg twice daily and is tolerating it well without bruising or bleeding.  She has had no recurrent TIA or stroke symptoms.  She was also started on Crestor 10 mg  which is tolerating well without side effects.  Her blood pressure has been difficult to control and primary care physician increase amlodipine from 5 to 10 mg daily but this led to significant increased leg swelling and she was started on some Lasix which has not been stopped after amlodipine dose was reduced to 5  mg.  She has not been referred for any outpatient occupational l therapy.     ROS:   14 system review of systems is positive for those listed in HPI and all other systems negative  PMH:  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 HTN   Orthostatic hypotension    OSA on CPAP    Osteopenia    PAF (paroxysmal atrial fibrillation) (HCC)    Pinched nerve in neck    Sleep apnea    wears CPAP   T12 compression fracture ([place]) 11/2015   Vitamin D deficiency    Whiplash injury 06/07/2010    Social History:  Social History   Socioeconomic History   Marital status: Married    Spouse name: Not on file   Number of children: 2   Years of education: Not on file   Highest education level: Not on file  Occupational History   Occupation: retired  Tobacco Use   Smoking status: Former    Packs/day: 0.50    Years: 44.00    Total pack years: 22.00    Types: Cigarettes, E-cigarettes    Start date: 1977    Quit date: 09/06/2013    Years since quitting: 8.1   Smokeless tobacco: Never  Vaping Use   Vaping Use: Former  Substance and Sexual Activity   Alcohol use: Not Currently    Comment: 03/25/2016 "nothing for a couple years now; was having a drink on anniversary and Christmas"   Drug use: No   Sexual activity: Not Currently  Other Topics Concern   Not on file  Social History Narrative   Retired. Worked for [employer] with daughter.     Social Determinants of Health   Financial Resource Strain: Not on file  Food Insecurity: Not on file  Transportation Needs: Not on file  Physical Activity: Not on file  Stress: Not on file  Social Connections: Not on file  Intimate Partner Violence: Not on file    Medications:   Current Outpatient Medications on File Prior to Visit  Medication Sig Dispense Refill   acetaminophen (TYLENOL) 500 MG tablet Take 1,000 mg by mouth every 6 (six) hours as needed for moderate pain or headache.     albuterol (VENTOLIN HFA) 108 (90 Base) MCG/ACT inhaler Inhale 1-2 puffs into the lungs every 6 (six) hours as needed for wheezing or shortness of breath. 8 g 2   amLODipine (NORVASC) 5 MG tablet  TAKE 1 TABLET BY MOUTH  DAILY 90 tablet 3   Carboxymethylcellulose Sodium (DRY EYE RELIEF OP) Apply 1 drop to eye as needed (Dryness).     carvedilol (COREG) 25 MG tablet Take 1 tablet (25 mg total) by mouth 2 (two) times daily. 180 tablet 3   Cholecalciferol (VITAMIN D) 50 MCG (2000 UT) CAPS Take 2,000 Units by mouth daily.     ELIQUIS 5 MG TABS tablet TAKE 1 TABLET BY MOUTH TWICE  DAILY 180 tablet 3   flecainide (TAMBOCOR) 50 MG tablet TAKE 1 TABLET BY MOUTH AS NEEDED 90 tablet 0   Hydrocortisone (NKNLZJQBH-41 EX) Apply 1 application topically as needed (Dry Skin).     loratadine (CLARITIN) 10 MG tablet Take 10 mg by mouth daily as needed for allergies.     LORazepam (ATIVAN) 0.5 MG tablet Take 1 tablet (0.5 mg total) by mouth every 8 (eight) hours as needed for anxiety. 60 tablet 2   meclizine (ANTIVERT) 12.5 MG tablet Take 1 tablet (12.5 mg total) by mouth 3 (three) times daily as needed for dizziness. 21 tablet 0   Na Sulfate-K Sulfate-Mg Sulf (SUPREP BOWEL PREP KIT) 17.5-3.13-1.6 GM/177ML SOLN Take 1 kit by mouth as directed. For colonoscopy prep 354 mL 0   rosuvastatin (CRESTOR) 10 MG tablet Take 1 tablet (10 mg total) by mouth at bedtime. 90 tablet 3   triamcinolone (NASACORT) 55 MCG/ACT AERO nasal  inhaler Place 1 spray into the nose daily as needed (allergies).     acetaminophen (TYLENOL) 650 MG CR tablet Take 650-1,300 mg by mouth every 8 (eight) hours as needed for pain.      No current facility-administered medications on file prior to visit.    Allergies:   Allergies  Allergen Reactions   Ace Inhibitors Cough   Elemental Sulfur Nausea And Vomiting   Hctz [Hydrochlorothiazide] Other (See Comments)    Caused drug-induced LUPUS   Oxycodone Other (See Comments)    Hallucinations   Prednisone Other (See Comments)    Made patient very aggressive   Sulfa Antibiotics Nausea And Vomiting   Voltaren [Diclofenac Sodium] Other (See Comments)    Made patient become aggressive    Physical Exam Today's Vitals   10/17/21 1431  BP: (!) 140/69  Pulse: 65  Weight: 219 lb 6 oz (99.5 kg)  Height: $Remove'5\' 7"'almecXP$  (1.702 m)   Body mass index is 34.36 kg/m.   General: Obese pleasant middle-age Caucasian lady seated, in no evident distress Head: head normocephalic and atraumatic.  Neck: supple with no carotid or supraclavicular bruits Cardiovascular: regular rate and rhythm, no murmurs Musculoskeletal: no deformity Skin:  no rash/petichiae Vascular:  Normal pulses all extremities  Neurologic Exam Mental Status: Awake and fully alert. Fluent speech and language. Oriented to place and time. Recent and remote memory intact. Attention span, concentration and fund of knowledge appropriate. Mood and affect appropriate.  Cranial Nerves: Pupils equal, briskly reactive to light. Extraocular movements full without nystagmus. Visual fields full showed partial right superior quadrantanopsia to confrontation. Hearing intact. Facial sensation intact. Face, tongue, palate moves normally and symmetrically.  Motor: Normal bulk and tone. Normal strength in all tested extremity muscles. Sensory.: intact to touch ,pinprick .position and vibratory sensation.  Coordination: Rapid alternating movements normal in all  extremities. Finger-to-nose and heel-to-shin performed accurately bilaterally. Gait and Station: Arises from chair without difficulty. Stance is normal. Gait demonstrates normal stride length and balance . Able to heel, toe and tandem walk with moderate difficulty.  Reflexes: 1+  and symmetric. Toes downgoing.        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  She is doing well but does have mild residual right superior quadrantanopsia.  Vascular risk factors of chronic atrial fibrillation, hypertension, hyperlipidemia and mild obesity    -continue Eliquis 5 mg twice daily for stroke prevention and A fib -continue to follow with cardiology as scheduled -recommend taking statin vacation over the next 5 days, if general body aches do not improve, advised to restart Crestor and to f/u with PCP. If symptoms do improve, advised her to call office to discuss other statin options. -Continue close PCP follow-up for aggressive stroke risk factor management including BP goal<130/90, and HLD with LDL goal<70   Overall stable from stroke standpoint without further recommendations and risk factors are managed by PCP. She may follow up PRN, as usual for our patients who are strictly being followed for stroke. If any new neurological issues should arise, request PCP place referral for evaluation by one of our neurologists. Thank you.     I spent 36 minutes of face-to-face and non-face-to-face time with patient.  This included previsit chart review, lab review, study review, order entry, electronic health record documentation, patient education and discussion re: above diagnoses and treatment plan and answered all other questions to patients satisfaction   Frann Rider, [facility]  [facility] [address] [place], [place] [address]  Phone [phone] Fax [phone] Note: This document was prepared with digital dictation and possible smart phrase technology. Any transcriptional errors that result from this process are unintentional.

## 2021-10-17 ENCOUNTER — Ambulatory Visit: Payer: Medicare Other | Admitting: Adult Health

## 2021-10-17 ENCOUNTER — Encounter: Payer: Self-pay | Admitting: Adult Health

## 2021-10-17 VITALS — BP 140/69 | HR 65 | Ht 67.0 in | Wt 219.4 lb

## 2021-10-17 DIAGNOSIS — H5340 Unspecified visual field defects: Secondary | ICD-10-CM | POA: Diagnosis not present

## 2021-10-17 DIAGNOSIS — I639 Cerebral infarction, unspecified: Secondary | ICD-10-CM | POA: Diagnosis not present

## 2021-10-17 DIAGNOSIS — I69398 Other sequelae of cerebral infarction: Secondary | ICD-10-CM | POA: Diagnosis not present

## 2021-10-17 DIAGNOSIS — I482 Chronic atrial fibrillation, unspecified: Secondary | ICD-10-CM | POA: Diagnosis not present

## 2021-10-17 NOTE — Patient Instructions (Addendum)
Continue Eliquis (apixaban) daily for secondary stroke prevention  Hold Crestor over the next 5 days, see if aches and pains improve, if not please restart Crestor and ensure follow up with your primary doctor. If symptoms do improve, you can further discuss other statin options with PCP  Continue to follow with cardiology as scheduled   Continue to follow up with PCP regarding cholesterol and blood pressure management  Maintain strict control of hypertension with blood pressure goal below 130/90 and cholesterol with LDL cholesterol (bad cholesterol) goal below 70 mg/dL.   Signs of a Stroke? Follow the BEFAST method:  Balance Watch for a sudden loss of balance, trouble with coordination or vertigo Eyes Is there a sudden loss of vision in one or both eyes? Or double vision?  Face: Ask the person to smile. Does one side of the face droop or is it numb?  Arms: Ask the person to raise both arms. Does one arm drift downward? Is there weakness or numbness of a leg? Speech: Ask the person to repeat a simple phrase. Does the speech sound slurred/strange? Is the person confused ? Time: If you observe any of these signs, call 911.        Thank you for coming to see [place] at [facility]. I hope we have been able to provide you high quality care today.  You may receive a patient satisfaction survey over the next few weeks. We would appreciate your feedback and comments so that we may continue to improve ourselves and the health of our patients.    Stroke Prevention Some medical conditions and lifestyle choices can lead to a higher risk for a stroke. You can help to prevent a stroke by eating healthy foods and exercising. It also helps to not smoke and to manage any health problems you may have. How can this condition affect me? A stroke is an emergency. It should be treated right away. A stroke can lead to brain damage or threaten your life. There is a better chance of surviving  and getting better after a stroke if you get medical help right away. What can increase my risk? The following medical conditions may increase your risk of a stroke: Diseases of the heart and blood vessels (cardiovascular disease). High blood pressure (hypertension). Diabetes. High cholesterol. Sickle cell disease. Problems with blood clotting. Being very overweight. Sleeping problems (obstructivesleep apnea). Other risk factors include: Being older than age 67. A history of blood clots, stroke, or mini-stroke (TIA). Race, ethnic background, or a family history of stroke. Smoking or using tobacco products. Taking birth control pills, especially if you smoke. Heavy alcohol and drug use. Not being active. What actions can I take to prevent this? Manage your health conditions High cholesterol. Eat a healthy diet. If this is not enough to manage your cholesterol, you may need to take medicines. Take medicines as told by your doctor. High blood pressure. Try to keep your blood pressure below 130/80. If your blood pressure cannot be managed through a healthy diet and regular exercise, you may need to take medicines. Take medicines as told by your doctor. Ask your doctor if you should check your blood pressure at home. Have your blood pressure checked every year. Diabetes. Eat a healthy diet and get regular exercise. If your blood sugar (glucose) cannot be managed through diet and exercise, you may need to take medicines. Take medicines as told by your doctor. Talk to your doctor about getting checked for sleeping problems. Signs  of a problem can include: Snoring a lot. Feeling very tired. Make sure that you manage any other conditions you have. Nutrition  Follow instructions from your doctor about what to eat or drink. You may be told to: Eat and drink fewer calories each day. Limit how much salt (sodium) you use to 1,500 milligrams (mg) each day. Use only healthy fats for  cooking, such as olive oil, canola oil, and sunflower oil. Eat healthy foods. To do this: Choose foods that are high in fiber. These include whole grains, and fresh fruits and vegetables. Eat at least 5 servings of fruits and vegetables a day. Try to fill one-half of your plate with fruits and vegetables at each meal. Choose low-fat (lean) proteins. These include low-fat cuts of meat, chicken without skin, fish, tofu, beans, and nuts. Eat low-fat dairy products. Avoid foods that: Are high in salt. Have saturated fat. Have trans fat. Have cholesterol. Are processed or pre-made. Count how many carbohydrates you eat and drink each day. Lifestyle If you drink alcohol: Limit how much you have to: 0-1 drink a day for women who are not pregnant. 0-2 drinks a day for men. Know how much alcohol is in your drink. In the [place] one drink equals one 12 oz bottle of beer (318m), one 5 oz glass of wine (1495m, or one 1 oz glass of hard liquor (4448m Do not smoke or use any products that have nicotine or tobacco. If you need help quitting, ask your doctor. Avoid secondhand smoke. Do not use drugs. Activity  Try to stay at a healthy weight. Get at least 30 minutes of exercise on most days, such as: Fast walking. Biking. Swimming. Medicines Take over-the-counter and prescription medicines only as told by your doctor. Avoid taking birth control pills. Talk to your doctor about the risks of taking birth control pills if: You are over 35 60ars old. You smoke. You get very bad headaches. You have had a blood clot. Where to find more information American Stroke Association: www.strokeassociation.org Get help right away if: You or a loved one has any signs of a stroke. "BE FAST" is an easy way to remember the warning signs: B - Balance. Dizziness, sudden trouble walking, or loss of balance. E - Eyes. Trouble seeing or a change in how you see. F - Face. Sudden weakness or loss of feeling of the  face. The face or eyelid may droop on one side. A - Arms. Weakness or loss of feeling in an arm. This happens all of a sudden and most often on one side of the body. S - Speech. Sudden trouble speaking, slurred speech, or trouble understanding what people say. T - Time. Time to call emergency services. Write down what time symptoms started. You or a loved one has other signs of a stroke, such as: A sudden, very bad headache with no known cause. Feeling like you may vomit (nausea). Vomiting. A seizure. These symptoms may be an emergency. Get help right away. Call your local emergency services (911 in the [place] Do not wait to see if the symptoms will go away. Do not drive yourself to the hospital. Summary You can help to prevent a stroke by eating healthy, exercising, and not smoking. It also helps to manage any health problems you have. Do not smoke or use any products that contain nicotine or tobacco. Get help right away if you or a loved one has any signs of a stroke. This information is not intended to  replace advice given to you by your health care provider. Make sure you discuss any questions you have with your health care provider. Document Revised: 07/25/2019 Document Reviewed: 07/25/2019 Elsevier Patient Education  [place].

## 2021-10-22 ENCOUNTER — Encounter: Payer: Self-pay | Admitting: Adult Health

## 2021-10-22 NOTE — Telephone Encounter (Signed)
Per las visit you recommend taking statin vacation over the next 5 days, If symptoms do improve, advised her to call office to discuss other statin options. She reports improvement what are next steps ?

## 2021-10-23 MED ORDER — SIMVASTATIN 20 MG PO TABS: 20.0000 mg | ORAL_TABLET | Freq: Every day | ORAL | 5 refills | Status: DC

## 2021-10-28 ENCOUNTER — Ambulatory Visit (INDEPENDENT_AMBULATORY_CARE_PROVIDER_SITE_OTHER): Payer: Medicare Other

## 2021-10-28 DIAGNOSIS — I480 Paroxysmal atrial fibrillation: Secondary | ICD-10-CM | POA: Diagnosis not present

## 2021-10-30 ENCOUNTER — Telehealth: Payer: Self-pay | Admitting: Nurse Practitioner

## 2021-10-30 LAB — CUP PACEART REMOTE DEVICE CHECK
Date Time Interrogation Session: 20231022231851
Implantable Pulse Generator Implant Date: 20210301

## 2021-10-30 NOTE — Telephone Encounter (Signed)
Left message for patient to call back and schedule Medicare Annual Wellness Visit (AWV) either virtually or phone  . Left  my Herbie Drape number [phone]   Last AWV 10/12/18    45 min for awv-i and in office appointments 30 min for awv-s  phone/virtual appointments

## 2021-11-04 ENCOUNTER — Ambulatory Visit (INDEPENDENT_AMBULATORY_CARE_PROVIDER_SITE_OTHER): Payer: Medicare Other

## 2021-11-04 VITALS — Ht 67.0 in | Wt 215.0 lb

## 2021-11-04 DIAGNOSIS — Z0000 Encounter for general adult medical examination without abnormal findings: Secondary | ICD-10-CM | POA: Diagnosis not present

## 2021-11-04 NOTE — Patient Instructions (Signed)
Shannon [name] , Thank you for taking time to come for your Medicare Wellness Visit. I appreciate your ongoing commitment to your health goals. Please review the following plan we discussed and let me know if I can assist you in the future.   Screening recommendations/referrals: Colonoscopy: patient to schedule Mammogram: patient to schedule Bone Density: completed 05/17/2021 Recommended yearly ophthalmology/optometry visit for glaucoma screening and checkup Recommended yearly dental visit for hygiene and checkup  Vaccinations: Influenza vaccine: due Pneumococcal vaccine: completed 12/08/2017 Tdap vaccine: completed 05/17/2021, due 05/18/2031 Shingles vaccine: discussed   Covid-19: 01/04/2020, 03/20/2019, 02/19/2019  Advanced directives: Advance directive discussed with you today.   Conditions/risks identified: none  Next appointment: Follow up in one year for your annual wellness visit    Preventive Care 65 Years and Older, Female Preventive care refers to lifestyle choices and visits with your health care provider that can promote health and wellness. What does preventive care include? A yearly physical exam. This is also called an annual well check. Dental exams once or twice a year. Routine eye exams. Ask your health care provider how often you should have your eyes checked. Personal lifestyle choices, including: Daily care of your teeth and gums. Regular physical activity. Eating a healthy diet. Avoiding tobacco and drug use. Limiting alcohol use. Practicing safe sex. Taking low-dose aspirin every day. Taking vitamin and mineral supplements as recommended by your health care provider. What happens during an annual well check? The services and screenings done by your health care provider during your annual well check will depend on your age, overall health, lifestyle risk factors, and family history of disease. Counseling  Your health care provider may ask you questions about  your: Alcohol use. Tobacco use. Drug use. Emotional well-being. Home and relationship well-being. Sexual activity. Eating habits. History of falls. Memory and ability to understand (cognition). Work and work Statistician. Reproductive health. Screening  You may have the following tests or measurements: Height, weight, and BMI. Blood pressure. Lipid and cholesterol levels. These may be checked every 5 years, or more frequently if you are over 24 years old. Skin check. Lung cancer screening. You may have this screening every year starting at age 35 if you have a 30-pack-year history of smoking and currently smoke or have quit within the past 15 years. Fecal occult blood test (FOBT) of the stool. You may have this test every year starting at age 42. Flexible sigmoidoscopy or colonoscopy. You may have a sigmoidoscopy every 5 years or a colonoscopy every 10 years starting at age 53. Hepatitis C blood test. Hepatitis B blood test. Sexually transmitted disease (STD) testing. Diabetes screening. This is done by checking your blood sugar (glucose) after you have not eaten for a while (fasting). You may have this done every 1-3 years. Bone density scan. This is done to screen for osteoporosis. You may have this done starting at age 79. Mammogram. This may be done every 1-2 years. Talk to your health care provider about how often you should have regular mammograms. Talk with your health care provider about your test results, treatment options, and if necessary, the need for more tests. Vaccines  Your health care provider may recommend certain vaccines, such as: Influenza vaccine. This is recommended every year. Tetanus, diphtheria, and acellular pertussis (Tdap, Td) vaccine. You may need a Td booster every 10 years. Zoster vaccine. You may need this after age 11. Pneumococcal 13-valent conjugate (PCV13) vaccine. One dose is recommended after age 1. Pneumococcal polysaccharide (PPSV23) vaccine.  One dose is recommended after age 55. Talk to your health care provider about which screenings and vaccines you need and how often you need them. This information is not intended to replace advice given to you by your health care provider. Make sure you discuss any questions you have with your health care provider. Document Released: 01/19/2015 Document Revised: 09/12/2015 Document Reviewed: 10/24/2014 Elsevier Interactive Patient Education  2017 [employer] Prevention in the Home Falls can cause injuries. They can happen to people of all ages. There are many things you can do to make your home safe and to help prevent falls. What can I do on the outside of my home? Regularly fix the edges of walkways and driveways and fix any cracks. Remove anything that might make you trip as you walk through a door, such as a raised step or threshold. Trim any bushes or trees on the path to your home. Use bright outdoor lighting. Clear any walking paths of anything that might make someone trip, such as rocks or tools. Regularly check to see if handrails are loose or broken. Make sure that both sides of any steps have handrails. Any raised decks and porches should have guardrails on the edges. Have any leaves, snow, or ice cleared regularly. Use sand or salt on walking paths during winter. Clean up any spills in your garage right away. This includes oil or grease spills. What can I do in the bathroom? Use night lights. Install grab bars by the toilet and in the tub and shower. Do not use towel bars as grab bars. Use non-skid mats or decals in the tub or shower. If you need to sit down in the shower, use a plastic, non-slip stool. Keep the floor dry. Clean up any water that spills on the floor as soon as it happens. Remove soap buildup in the tub or shower regularly. Attach bath mats securely with double-sided non-slip rug tape. Do not have throw rugs and other things on the floor that can make  you trip. What can I do in the bedroom? Use night lights. Make sure that you have a light by your bed that is easy to reach. Do not use any sheets or blankets that are too big for your bed. They should not hang down onto the floor. Have a firm chair that has side arms. You can use this for support while you get dressed. Do not have throw rugs and other things on the floor that can make you trip. What can I do in the kitchen? Clean up any spills right away. Avoid walking on wet floors. Keep items that you use a lot in easy-to-reach places. If you need to reach something above you, use a strong step stool that has a grab bar. Keep electrical cords out of the way. Do not use floor polish or wax that makes floors slippery. If you must use wax, use non-skid floor wax. Do not have throw rugs and other things on the floor that can make you trip. What can I do with my stairs? Do not leave any items on the stairs. Make sure that there are handrails on both sides of the stairs and use them. Fix handrails that are broken or loose. Make sure that handrails are as long as the stairways. Check any carpeting to make sure that it is firmly attached to the stairs. Fix any carpet that is loose or worn. Avoid having throw rugs at the top or bottom of the  stairs. If you do have throw rugs, attach them to the floor with carpet tape. Make sure that you have a light switch at the top of the stairs and the bottom of the stairs. If you do not have them, ask someone to add them for you. What else can I do to help prevent falls? Wear shoes that: Do not have high heels. Have rubber bottoms. Are comfortable and fit you well. Are closed at the toe. Do not wear sandals. If you use a stepladder: Make sure that it is fully opened. Do not climb a closed stepladder. Make sure that both sides of the stepladder are locked into place. Ask someone to hold it for you, if possible. Clearly mark and make sure that you can  see: Any grab bars or handrails. First and last steps. Where the edge of each step is. Use tools that help you move around (mobility aids) if they are needed. These include: Canes. Walkers. Scooters. Crutches. Turn on the lights when you go into a dark area. Replace any light bulbs as soon as they burn out. Set up your furniture so you have a clear path. Avoid moving your furniture around. If any of your floors are uneven, fix them. If there are any pets around you, be aware of where they are. Review your medicines with your doctor. Some medicines can make you feel dizzy. This can increase your chance of falling. Ask your doctor what other things that you can do to help prevent falls. This information is not intended to replace advice given to you by your health care provider. Make sure you discuss any questions you have with your health care provider. Document Released: 10/19/2008 Document Revised: 05/31/2015 Document Reviewed: 01/27/2014 Elsevier Interactive Patient Education  2017 [employer].
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
## 2021-11-22 ENCOUNTER — Ambulatory Visit (INDEPENDENT_AMBULATORY_CARE_PROVIDER_SITE_OTHER): Payer: Medicare Other | Admitting: Nurse Practitioner

## 2021-11-22 VITALS — BP 130/88 | HR 70 | Temp 96.0°F | Resp 16 | Ht 67.0 in | Wt 215.0 lb

## 2021-11-22 DIAGNOSIS — M791 Myalgia, unspecified site: Secondary | ICD-10-CM | POA: Diagnosis not present

## 2021-11-22 DIAGNOSIS — M546 Pain in thoracic spine: Secondary | ICD-10-CM | POA: Diagnosis not present

## 2021-11-22 DIAGNOSIS — R600 Localized edema: Secondary | ICD-10-CM

## 2021-11-22 DIAGNOSIS — H9313 Tinnitus, bilateral: Secondary | ICD-10-CM | POA: Diagnosis not present

## 2021-11-22 NOTE — Assessment & Plan Note (Signed)
History of thoracic spine vertebral fracture.  Obtain thoracic spine x-ray.  Pending results.

## 2021-11-22 NOTE — Assessment & Plan Note (Signed)
Not currently taking furosemide or aspirin in large amounts.  No barotrauma or direct trauma.  Ears within normal limits on exam.  Amatory referral to ENT

## 2021-11-22 NOTE — Assessment & Plan Note (Signed)
Likely dependent in nature.  Patient's been wearing compression socks but seems to be in the evenings and at night.  Told her to wear it throughout the day and take them off in the evening.  She will work on elevating her lower extremities when she can.  She will update via MyChart in a couple weeks.

## 2021-11-22 NOTE — Patient Instructions (Signed)
Simvastatin every other day on the dosing Wear the compression socks during the day We can follow up in 1 month

## 2021-11-22 NOTE — Progress Notes (Signed)
Established Patient Office Visit  Subjective   Patient ID: Shannon [name], female    DOB: Aug 16, 1950  Age: 71 y.o. MRN: [id number]  Chief Complaint  Patient presents with   Follow-up    On chronic conditions    Tinnitus    Both ears, has gotten worse in the last 6 months. Ringing gets so loud at times and hearing loss   Back Pain    If stands too long her back hurts, gets mid back pain with some numbness in it after doing chores like dishes, laundry. HX lower back fracture after fall and arthritis. Cervical spine DDD.    Joint Pain    Feels like this is from statin medication    HPI  HTN: States that she is careful with her salt. Still having. Has been using compression stockings that help and helps with elevation.  Afib: Dr Quentin Ore has an ablation scheduled on 12/23/2021.  Tinnitus: States that she has had it for a solid 6 months. States that she has noticed when she drinks caffiene it makes it worse. States that she has cut back on caffiene. Statesa that she does listent ot books on tape but not loud. No ENT.    Back pain: states that it bothers her with extendedperiod of time. States that her middle back is numb after she stands to long.  Does have a history of vertbral compression fracture, No new injury. Removing bra does help   Joint pain: States that she has been having joint pain since jan. Has changed and went off statins in past and that help. She has tried rosuvastatin and had bad body aches     Review of Systems  Constitutional:  Negative for chills and fever.  Respiratory:  Positive for shortness of breath.   Cardiovascular:  Positive for palpitations. Negative for chest pain.  Genitourinary:        Negative B&B  Musculoskeletal:  Positive for back pain, joint pain and myalgias.      Objective:     BP 130/88   Pulse 70   Temp (!) 96 F (35.6 C) (Temporal)   Resp 16   Ht '5\' 7"'$  (1.702 m)   Wt 215 lb (97.5 kg)   SpO2 97%   BMI 33.67  kg/m    Physical Exam Vitals and nursing note reviewed.  Constitutional:      Appearance: Normal appearance. She is obese.  HENT:     Right Ear: Tympanic membrane, ear canal and external ear normal.     Left Ear: Tympanic membrane, ear canal and external ear normal.  Cardiovascular:     Rate and Rhythm: Normal rate.  Musculoskeletal:       Back:     Right lower leg: Edema present.     Left lower leg: Edema (L>R) present.  Lymphadenopathy:     Cervical: No cervical adenopathy.  Neurological:     Mental Status: She is alert.      No results found for any visits on 11/22/21.    The ASCVD Risk score (Arnett DK, et al., 2019) failed to calculate for the following reasons:   The patient has a prior MI or stroke diagnosis    Assessment & Plan:   Problem List Items Addressed This Visit       Other   Myalgia    Thinks it is statin related.  She has been on rosuvastatin and then switched to simvastatin still experiencing.  We will move simvastatin from  daily to every other day.  Patient will try this for 2 weeks and then reach out.  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      Thoracic spine pain - Primary    History of thoracic spine vertebral fracture.  Obtain thoracic spine x-ray.  Pending results.      Relevant Orders   DG Thoracic Spine W/Swimmers   Tinnitus of both ears    Not currently taking furosemide or aspirin in large amounts.  No barotrauma or direct trauma.  Ears within normal limits on exam.  Amatory referral to ENT      Relevant Orders   Ambulatory referral to ENT   Bilateral lower extremity edema    Likely dependent in nature.  Patient's been wearing compression socks but seems to be in the evenings and at night.  Told her to wear it throughout the day and take them off in the evening.  She will work on elevating her lower extremities when  she can.  She will update via MyChart in a couple weeks.       Return in about 4 weeks (around 12/20/2021) for Recheck .    Romilda Garret, NP

## 2021-11-22 NOTE — Assessment & Plan Note (Signed)
Thinks it is statin related.  She has been on rosuvastatin and then switched to simvastatin still experiencing.  We will move simvastatin from daily to every other day.  Patient will try this for 2 weeks and then reach out.  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

## 2021-11-26 ENCOUNTER — Ambulatory Visit (INDEPENDENT_AMBULATORY_CARE_PROVIDER_SITE_OTHER)
Admission: RE | Admit: 2021-11-26 | Discharge: 2021-11-26 | Disposition: A | Discharge status: Home / Self Care | Payer: Medicare Other | Source: Ambulatory Visit | Attending: Nurse Practitioner | Admitting: Nurse Practitioner

## 2021-11-26 DIAGNOSIS — M546 Pain in thoracic spine: Secondary | ICD-10-CM | POA: Diagnosis not present

## 2021-11-27 ENCOUNTER — Telehealth: Payer: Self-pay | Admitting: Cardiology

## 2021-11-27 ENCOUNTER — Other Ambulatory Visit: Payer: Self-pay | Admitting: Nurse Practitioner

## 2021-11-27 DIAGNOSIS — Z1231 Encounter for screening mammogram for malignant neoplasm of breast: Secondary | ICD-10-CM

## 2021-11-27 NOTE — Telephone Encounter (Signed)
Called patient and she was worried because she missed on dose of her Eliquis yesterday, and she has an ablation scheduled on December 18th. I informed her that she should be fine as long as she does not miss anymore doses. Will route to Dr. Claudie Revering nurse as an Juluis Rainier.

## 2021-11-27 NOTE — Telephone Encounter (Signed)
Pt c/o medication issue:  1. Name of Medication: ELIQUIS 5 MG TABS tablet   2. How are you currently taking this medication (dosage and times per day)? As prescribed  3. Are you having a reaction (difficulty breathing--STAT)?   4. What is your medication issue? Patient is requesting call back in regards to missing a dose of this medication. She was told that she did not need to miss a dose before her upcoming procedure and would like a call back to discuss this.

## 2021-12-02 ENCOUNTER — Ambulatory Visit (INDEPENDENT_AMBULATORY_CARE_PROVIDER_SITE_OTHER): Payer: Medicare Other

## 2021-12-02 ENCOUNTER — Ambulatory Visit
Admission: RE | Admit: 2021-12-02 | Discharge: 2021-12-02 | Disposition: A | Discharge status: Home / Self Care | Payer: Medicare Other | Source: Ambulatory Visit | Attending: Nurse Practitioner | Admitting: Nurse Practitioner

## 2021-12-02 DIAGNOSIS — I480 Paroxysmal atrial fibrillation: Secondary | ICD-10-CM | POA: Diagnosis not present

## 2021-12-02 DIAGNOSIS — Z1231 Encounter for screening mammogram for malignant neoplasm of breast: Secondary | ICD-10-CM

## 2021-12-02 NOTE — Progress Notes (Signed)
Carelink Summary Report / Loop Recorder 

## 2021-12-03 ENCOUNTER — Encounter: Payer: Self-pay | Admitting: Nurse Practitioner

## 2021-12-03 LAB — CUP PACEART REMOTE DEVICE CHECK
Date Time Interrogation Session: 20231126231940
Implantable Pulse Generator Implant Date: 20210301

## 2021-12-04 ENCOUNTER — Other Ambulatory Visit: Payer: Self-pay | Admitting: Cardiology

## 2021-12-11 ENCOUNTER — Telehealth: Payer: Self-pay

## 2021-12-11 ENCOUNTER — Other Ambulatory Visit: Payer: Medicare Other

## 2021-12-11 ENCOUNTER — Other Ambulatory Visit: Payer: Self-pay

## 2021-12-11 DIAGNOSIS — I480 Paroxysmal atrial fibrillation: Secondary | ICD-10-CM

## 2021-12-11 NOTE — Telephone Encounter (Signed)
Pt has been moved from 12/18 to 12/8 due to a cancellation on the 8th. She will have her CT Scan done at [facility] 12/7 @ 8am then come by the [facility] office for labs.  She is aware and understands all date/times and instructions for her procedure.   I have sent new instruction letters via [place].

## 2021-12-12 ENCOUNTER — Encounter: Payer: Self-pay | Admitting: Cardiology

## 2021-12-12 ENCOUNTER — Ambulatory Visit: Payer: Medicare Other

## 2021-12-12 ENCOUNTER — Inpatient Hospital Stay (HOSPITAL_BASED_OUTPATIENT_CLINIC_OR_DEPARTMENT_OTHER)
Admission: RE | Admit: 2021-12-12 | Discharge: 2021-12-12 | Disposition: A | Discharge status: Home / Self Care | Payer: Medicare Other | Source: Ambulatory Visit | Attending: Cardiology | Admitting: Cardiology

## 2021-12-12 DIAGNOSIS — I480 Paroxysmal atrial fibrillation: Secondary | ICD-10-CM

## 2021-12-12 DIAGNOSIS — I5032 Chronic diastolic (congestive) heart failure: Secondary | ICD-10-CM | POA: Insufficient documentation

## 2021-12-12 DIAGNOSIS — Z01818 Encounter for other preprocedural examination: Secondary | ICD-10-CM | POA: Insufficient documentation

## 2021-12-12 DIAGNOSIS — I10 Essential (primary) hypertension: Secondary | ICD-10-CM

## 2021-12-12 LAB — BASIC METABOLIC PANEL
BUN/Creatinine Ratio: 17 (ref 12–28)
BUN: 12 mg/dL (ref 8–27)
CO2: 30 mmol/L — ABNORMAL HIGH (ref 20–29)
Calcium: 9.5 mg/dL (ref 8.7–10.3)
Chloride: 102 mmol/L (ref 96–106)
Creatinine, Ser: 0.7 mg/dL (ref 0.57–1.00)
Glucose: 114 mg/dL — ABNORMAL HIGH (ref 70–99)
Potassium: 4.6 mmol/L (ref 3.5–5.2)
Sodium: 139 mmol/L (ref 134–144)
eGFR: 93 mL/min/{1.73_m2} (ref >59)

## 2021-12-12 LAB — CBC
Hematocrit: 37.9 % (ref 34.0–46.6)
Hemoglobin: 12.3 g/dL (ref 11.1–15.9)
MCH: 29 pg (ref 26.6–33.0)
MCHC: 32.5 g/dL (ref 31.5–35.7)
MCV: 89 fL (ref 79–97)
Platelets: 302 10*3/uL (ref 150–450)
RBC: 4.24 x10E6/uL (ref 3.77–5.28)
RDW: 14.1 % (ref 11.7–15.4)
WBC: 7.9 10*3/uL (ref 3.4–10.8)

## 2021-12-12 MED ORDER — IOHEXOL 350 MG/ML SOLN
100.0000 mL | Freq: Once | INTRAVENOUS | Status: AC | PRN
Start: 2021-12-12 — End: 2021-12-12
  Administered 2021-12-12: 100 mL via INTRAVENOUS

## 2021-12-12 NOTE — Pre-Procedure Instructions (Signed)
Attempted to call patient regarding procedure instructions.  Left voice mail on the following items: Arrival time 0830 Nothing to eat or drink after midnight No meds AM of procedure Responsible person to drive you home and stay with you for 24 hrs  Have you missed any doses of anti-coagulant eliquis-if you have missed any doses please let office know right away.

## 2021-12-13 ENCOUNTER — Encounter (HOSPITAL_COMMUNITY): Payer: Self-pay | Admitting: Cardiology

## 2021-12-13 ENCOUNTER — Other Ambulatory Visit: Payer: Self-pay

## 2021-12-13 ENCOUNTER — Ambulatory Visit: Payer: Medicare Other | Admitting: Nurse Practitioner

## 2021-12-13 ENCOUNTER — Ambulatory Visit (HOSPITAL_COMMUNITY): Payer: Medicare Other | Admitting: Certified Registered Nurse Anesthetist

## 2021-12-13 ENCOUNTER — Other Ambulatory Visit (HOSPITAL_COMMUNITY): Payer: Self-pay

## 2021-12-13 ENCOUNTER — Inpatient Hospital Stay (HOSPITAL_COMMUNITY)
Admission: RE | Admit: 2021-12-13 | Discharge: 2021-12-16 | DRG: 274 | Disposition: A | Discharge status: Home / Self Care | Payer: Medicare Other | Attending: Cardiology | Admitting: Cardiology

## 2021-12-13 ENCOUNTER — Other Ambulatory Visit: Payer: Self-pay | Admitting: Cardiology

## 2021-12-13 ENCOUNTER — Encounter (HOSPITAL_COMMUNITY)
Admission: RE | Disposition: A | Discharge status: Home / Self Care | Payer: Self-pay | Source: Home / Self Care | Attending: Cardiology

## 2021-12-13 DIAGNOSIS — Z79899 Other long term (current) drug therapy: Secondary | ICD-10-CM | POA: Diagnosis not present

## 2021-12-13 DIAGNOSIS — I2510 Atherosclerotic heart disease of native coronary artery without angina pectoris: Secondary | ICD-10-CM | POA: Diagnosis present

## 2021-12-13 DIAGNOSIS — Z87891 Personal history of nicotine dependence: Secondary | ICD-10-CM

## 2021-12-13 DIAGNOSIS — Z8673 Personal history of transient ischemic attack (TIA), and cerebral infarction without residual deficits: Secondary | ICD-10-CM

## 2021-12-13 DIAGNOSIS — I4891 Unspecified atrial fibrillation: Secondary | ICD-10-CM

## 2021-12-13 DIAGNOSIS — G4733 Obstructive sleep apnea (adult) (pediatric): Secondary | ICD-10-CM | POA: Diagnosis present

## 2021-12-13 DIAGNOSIS — Z8249 Family history of ischemic heart disease and other diseases of the circulatory system: Secondary | ICD-10-CM | POA: Diagnosis not present

## 2021-12-13 DIAGNOSIS — E785 Hyperlipidemia, unspecified: Secondary | ICD-10-CM | POA: Diagnosis present

## 2021-12-13 DIAGNOSIS — M549 Dorsalgia, unspecified: Secondary | ICD-10-CM | POA: Diagnosis present

## 2021-12-13 DIAGNOSIS — I4819 Other persistent atrial fibrillation: Principal | ICD-10-CM | POA: Diagnosis present

## 2021-12-13 DIAGNOSIS — G8929 Other chronic pain: Secondary | ICD-10-CM | POA: Diagnosis present

## 2021-12-13 DIAGNOSIS — I110 Hypertensive heart disease with heart failure: Secondary | ICD-10-CM | POA: Diagnosis present

## 2021-12-13 DIAGNOSIS — Z95828 Presence of other vascular implants and grafts: Secondary | ICD-10-CM

## 2021-12-13 DIAGNOSIS — Z882 Allergy status to sulfonamides status: Secondary | ICD-10-CM

## 2021-12-13 DIAGNOSIS — I5032 Chronic diastolic (congestive) heart failure: Secondary | ICD-10-CM | POA: Diagnosis present

## 2021-12-13 DIAGNOSIS — Z885 Allergy status to narcotic agent status: Secondary | ICD-10-CM

## 2021-12-13 DIAGNOSIS — I480 Paroxysmal atrial fibrillation: Secondary | ICD-10-CM | POA: Diagnosis present

## 2021-12-13 DIAGNOSIS — F419 Anxiety disorder, unspecified: Secondary | ICD-10-CM | POA: Diagnosis present

## 2021-12-13 DIAGNOSIS — Z7901 Long term (current) use of anticoagulants: Secondary | ICD-10-CM

## 2021-12-13 DIAGNOSIS — Z888 Allergy status to other drugs, medicaments and biological substances status: Secondary | ICD-10-CM | POA: Diagnosis not present

## 2021-12-13 DIAGNOSIS — J45909 Unspecified asthma, uncomplicated: Secondary | ICD-10-CM | POA: Diagnosis present

## 2021-12-13 DIAGNOSIS — K219 Gastro-esophageal reflux disease without esophagitis: Secondary | ICD-10-CM | POA: Diagnosis present

## 2021-12-13 DIAGNOSIS — I509 Heart failure, unspecified: Secondary | ICD-10-CM | POA: Diagnosis not present

## 2021-12-13 HISTORY — PX: ATRIAL FIBRILLATION ABLATION: EP1191

## 2021-12-13 HISTORY — DX: Paroxysmal atrial fibrillation: I48.0

## 2021-12-13 LAB — BASIC METABOLIC PANEL
Anion gap: 8 (ref 5–15)
BUN: 10 mg/dL (ref 8–23)
CO2: 25 mmol/L (ref 22–32)
Calcium: 8.7 mg/dL — ABNORMAL LOW (ref 8.9–10.3)
Chloride: 107 mmol/L (ref 98–111)
Creatinine, Ser: 0.75 mg/dL (ref 0.44–1.00)
GFR, Estimated: >60 mL/min (ref >60)
Glucose, Bld: 135 mg/dL — ABNORMAL HIGH (ref 70–99)
Potassium: 3.6 mmol/L (ref 3.5–5.1)
Sodium: 140 mmol/L (ref 135–145)

## 2021-12-13 LAB — POCT ACTIVATED CLOTTING TIME: Activated Clotting Time: 320 seconds

## 2021-12-13 LAB — MAGNESIUM: Magnesium: 2.2 mg/dL (ref 1.7–2.4)

## 2021-12-13 SURGERY — ATRIAL FIBRILLATION ABLATION
Anesthesia: General

## 2021-12-13 MED ORDER — PROPOFOL 10 MG/ML IV BOLUS
INTRAVENOUS | Status: DC | PRN
  Administered 2021-12-13: 150 mg via INTRAVENOUS

## 2021-12-13 MED ORDER — CARVEDILOL 25 MG PO TABS
25.0000 mg | ORAL_TABLET | Freq: Two times a day (BID) | ORAL | Status: DC
  Administered 2021-12-13 – 2021-12-14 (×2): 25 mg via ORAL
  Filled 2021-12-13 (×2): qty 1

## 2021-12-13 MED ORDER — PANTOPRAZOLE SODIUM 40 MG PO TBEC
40.0000 mg | DELAYED_RELEASE_TABLET | Freq: Every day | ORAL | Status: DC
  Administered 2021-12-13 – 2021-12-16 (×4): 40 mg via ORAL
  Filled 2021-12-13 (×4): qty 1

## 2021-12-13 MED ORDER — SODIUM CHLORIDE 0.9 % IV SOLN: INTRAVENOUS | Status: DC

## 2021-12-13 MED ORDER — ACETAMINOPHEN 10 MG/ML IV SOLN
INTRAVENOUS | Status: DC | PRN
  Administered 2021-12-13: 1000 mg via INTRAVENOUS

## 2021-12-13 MED ORDER — DOFETILIDE 500 MCG PO CAPS
500.0000 ug | ORAL_CAPSULE | Freq: Two times a day (BID) | ORAL | Status: DC
  Administered 2021-12-13 – 2021-12-14 (×2): 500 ug via ORAL
  Filled 2021-12-13 (×2): qty 1

## 2021-12-13 MED ORDER — FENTANYL CITRATE (PF) 100 MCG/2ML IJ SOLN
INTRAMUSCULAR | Status: DC | PRN
  Administered 2021-12-13: 100 ug via INTRAVENOUS

## 2021-12-13 MED ORDER — POTASSIUM CHLORIDE CRYS ER 20 MEQ PO TBCR
40.0000 meq | EXTENDED_RELEASE_TABLET | Freq: Once | ORAL | Status: AC
  Administered 2021-12-13: 40 meq via ORAL
  Filled 2021-12-13: qty 2

## 2021-12-13 MED ORDER — SODIUM CHLORIDE 0.9% FLUSH
3.0000 mL | Freq: Two times a day (BID) | INTRAVENOUS | Status: DC
  Administered 2021-12-14 – 2021-12-15 (×4): 3 mL via INTRAVENOUS

## 2021-12-13 MED ORDER — PROTAMINE SULFATE 10 MG/ML IV SOLN
INTRAVENOUS | Status: DC | PRN
  Administered 2021-12-13: 30 mg via INTRAVENOUS

## 2021-12-13 MED ORDER — HEPARIN SODIUM (PORCINE) 1000 UNIT/ML IJ SOLN
INTRAMUSCULAR | Status: DC | PRN
  Administered 2021-12-13: 1000 [IU] via INTRAVENOUS

## 2021-12-13 MED ORDER — APIXABAN 5 MG PO TABS
5.0000 mg | ORAL_TABLET | Freq: Two times a day (BID) | ORAL | Status: DC
  Administered 2021-12-13 – 2021-12-16 (×7): 5 mg via ORAL
  Filled 2021-12-13 (×8): qty 1

## 2021-12-13 MED ORDER — LORAZEPAM 0.5 MG PO TABS
0.5000 mg | ORAL_TABLET | Freq: Three times a day (TID) | ORAL | Status: DC | PRN
  Administered 2021-12-14: 0.5 mg via ORAL
  Filled 2021-12-13: qty 1

## 2021-12-13 MED ORDER — SODIUM CHLORIDE 0.9 % IV SOLN: 250.0000 mL | INTRAVENOUS | Status: DC | PRN

## 2021-12-13 MED ORDER — DOFETILIDE 500 MCG PO CAPS: 500.0000 ug | ORAL_CAPSULE | Freq: Two times a day (BID) | ORAL | Status: DC

## 2021-12-13 MED ORDER — HEPARIN (PORCINE) IN NACL 1000-0.9 UT/500ML-% IV SOLN
INTRAVENOUS | Status: DC | PRN
  Administered 2021-12-13 (×4): 500 mL

## 2021-12-13 MED ORDER — ONDANSETRON HCL 4 MG/2ML IJ SOLN
INTRAMUSCULAR | Status: DC | PRN
  Administered 2021-12-13: 4 mg via INTRAVENOUS

## 2021-12-13 MED ORDER — HEPARIN SODIUM (PORCINE) 1000 UNIT/ML IJ SOLN
INTRAMUSCULAR | Status: AC
  Filled 2021-12-13: qty 10

## 2021-12-13 MED ORDER — AMLODIPINE BESYLATE 5 MG PO TABS
5.0000 mg | ORAL_TABLET | Freq: Every day | ORAL | Status: DC
  Administered 2021-12-14: 5 mg via ORAL
  Filled 2021-12-13: qty 1

## 2021-12-13 MED ORDER — HYDRALAZINE HCL 20 MG/ML IJ SOLN
10.0000 mg | Freq: Once | INTRAMUSCULAR | Status: AC
  Administered 2021-12-13: 10 mg via INTRAVENOUS

## 2021-12-13 MED ORDER — ACETAMINOPHEN 325 MG PO TABS
650.0000 mg | ORAL_TABLET | ORAL | Status: DC | PRN
  Administered 2021-12-13: 650 mg via ORAL
  Filled 2021-12-13: qty 2

## 2021-12-13 MED ORDER — DEXAMETHASONE SODIUM PHOSPHATE 10 MG/ML IJ SOLN
INTRAMUSCULAR | Status: DC | PRN
  Administered 2021-12-13: 4 mg via INTRAVENOUS

## 2021-12-13 MED ORDER — SODIUM CHLORIDE 0.9% FLUSH: 3.0000 mL | INTRAVENOUS | Status: DC | PRN

## 2021-12-13 MED ORDER — SUGAMMADEX SODIUM 200 MG/2ML IV SOLN
INTRAVENOUS | Status: DC | PRN
  Administered 2021-12-13: 200 mg via INTRAVENOUS

## 2021-12-13 MED ORDER — COLCHICINE 0.6 MG PO TABS
0.6000 mg | ORAL_TABLET | Freq: Two times a day (BID) | ORAL | Status: DC
  Administered 2021-12-13 – 2021-12-16 (×7): 0.6 mg via ORAL
  Filled 2021-12-13 (×7): qty 1

## 2021-12-13 MED ORDER — HEPARIN SODIUM (PORCINE) 1000 UNIT/ML IJ SOLN
INTRAMUSCULAR | Status: DC | PRN
  Administered 2021-12-13: 16000 [IU] via INTRAVENOUS

## 2021-12-13 MED ORDER — HYDRALAZINE HCL 20 MG/ML IJ SOLN
INTRAMUSCULAR | Status: AC
  Filled 2021-12-13: qty 1

## 2021-12-13 MED ORDER — SIMVASTATIN 20 MG PO TABS
20.0000 mg | ORAL_TABLET | Freq: Every day | ORAL | Status: DC
  Administered 2021-12-13 – 2021-12-15 (×3): 20 mg via ORAL
  Filled 2021-12-13 (×3): qty 1

## 2021-12-13 MED ORDER — ACETAMINOPHEN 500 MG PO TABS: 1000.0000 mg | ORAL_TABLET | Freq: Once | ORAL | Status: DC

## 2021-12-13 MED ORDER — PHENYLEPHRINE HCL-NACL 20-0.9 MG/250ML-% IV SOLN
INTRAVENOUS | Status: DC | PRN
  Administered 2021-12-13: 20 ug/min via INTRAVENOUS

## 2021-12-13 MED ORDER — ROCURONIUM BROMIDE 10 MG/ML (PF) SYRINGE
PREFILLED_SYRINGE | INTRAVENOUS | Status: DC | PRN
  Administered 2021-12-13: 60 mg via INTRAVENOUS

## 2021-12-13 MED ORDER — LIDOCAINE 2% (20 MG/ML) 5 ML SYRINGE
INTRAMUSCULAR | Status: DC | PRN
  Administered 2021-12-13: 60 mg via INTRAVENOUS

## 2021-12-13 MED ORDER — SODIUM CHLORIDE 0.9% FLUSH
3.0000 mL | Freq: Two times a day (BID) | INTRAVENOUS | Status: DC
  Administered 2021-12-13 – 2021-12-15 (×3): 3 mL via INTRAVENOUS

## 2021-12-13 MED ORDER — ONDANSETRON HCL 4 MG/2ML IJ SOLN: 4.0000 mg | Freq: Four times a day (QID) | INTRAMUSCULAR | Status: DC | PRN

## 2021-12-13 SURGICAL SUPPLY — 18 items
BLANKET WARM UNDERBOD FULL ACC (MISCELLANEOUS) ×1 IMPLANT
CATH 8FR REPROCESSED SOUNDSTAR (CATHETERS) ×1 IMPLANT
CATH 8FR SOUNDSTAR REPROCESSED (CATHETERS) IMPLANT
CATH ABLAT QDOT MICRO BI TC FJ (CATHETERS) IMPLANT
CATH OCTARAY 2.0 F 3-3-3-3-3 (CATHETERS) IMPLANT
CATH S-M CIRCA TEMP PROBE (CATHETERS) IMPLANT
CATH WEB BI DIR CSDF CRV REPRO (CATHETERS) IMPLANT
CLOSURE PERCLOSE PROSTYLE (VASCULAR PRODUCTS) IMPLANT
COVER SWIFTLINK CONNECTOR (BAG) ×1 IMPLANT
PACK EP LATEX FREE (CUSTOM PROCEDURE TRAY) ×1
PACK EP LF (CUSTOM PROCEDURE TRAY) ×1 IMPLANT
PAD DEFIB RADIO PHYSIO CONN (PAD) ×1 IMPLANT
PATCH CARTO3 (PAD) IMPLANT
SHEATH BAYLIS TRANSSEPTAL 98CM (NEEDLE) IMPLANT
SHEATH CARTO VIZIGO SM CVD (SHEATH) IMPLANT
SHEATH PINNACLE 8F 10CM (SHEATH) IMPLANT
SHEATH PINNACLE 9F 10CM (SHEATH) IMPLANT
TUBING SMART ABLATE COOLFLOW (TUBING) IMPLANT

## 2021-12-13 NOTE — Progress Notes (Signed)
Pt has home unit and is able to place on by themself. Will call if assistance is needed.

## 2021-12-13 NOTE — Anesthesia Preprocedure Evaluation (Addendum)
Anesthesia Evaluation  Patient identified by MRN, date of birth, ID band Patient awake    Reviewed: Allergy & Precautions, H&P , NPO status , Patient's Chart, lab work & pertinent test results, reviewed documented beta blocker date and time   Airway Mallampati: II  TM Distance: >3 FB Neck ROM: Full    Dental no notable dental hx. (+) Teeth Intact, Dental Advisory Given   Pulmonary asthma , sleep apnea , former smoker   Pulmonary exam normal breath sounds clear to auscultation       Cardiovascular hypertension, Pt. on medications and Pt. on home beta blockers + CAD and +CHF   Rhythm:Regular Rate:Normal     Neuro/Psych   Anxiety Depression    CVA    GI/Hepatic Neg liver ROS,GERD  Medicated,,  Endo/Other  negative endocrine ROS    Renal/GU negative Renal ROS  negative genitourinary   Musculoskeletal  (+) Arthritis , Osteoarthritis,    Abdominal   Peds  Hematology  (+) Blood dyscrasia, anemia   Anesthesia Other Findings   Reproductive/Obstetrics negative OB ROS                             Anesthesia Physical Anesthesia Plan  ASA: 3  Anesthesia Plan: General   Post-op Pain Management: Tylenol PO (pre-op)*   Induction: Intravenous  PONV Risk Score and Plan: 4 or greater and Ondansetron, Dexamethasone and Treatment may vary due to age or medical condition  Airway Management Planned: Oral ETT  Additional Equipment:   Intra-op Plan:   Post-operative Plan: Extubation in OR  Informed Consent: I have reviewed the patients History and Physical, chart, labs and discussed the procedure including the risks, benefits and alternatives for the proposed anesthesia with the patient or authorized representative who has indicated his/her understanding and acceptance.     Dental advisory given  Plan Discussed with: CRNA  Anesthesia Plan Comments:        Anesthesia Quick Evaluation

## 2021-12-13 NOTE — Progress Notes (Signed)
Pharmacy: Dofetilide (Tikosyn) - Initial Consult Assessment and Electrolyte Replacement  Pharmacy consulted to assist in monitoring and replacing electrolytes in this 71 y.o. female admitted on 12/13/2021 undergoing dofetilide initiation.   Assessment:  Patient Exclusion Criteria: If any screening criteria checked as "Yes", then  patient  should NOT receive dofetilide until criteria item is corrected.  If "Yes" please indicate correction plan.  YES  NO Patient  Exclusion Criteria Correction Plan   '[x]'$   '[]'$   Baseline QTc interval is greater than or equal to 440 msec. IF above YES box checked dofetilide contraindicated unless patient has ICD; then may proceed if QTc 500-550 msec or with known ventricular conduction abnormalities may proceed with QTc 550-600 msec. QTc =  488 Monitor closely with initiation per Dr. Quentin Ore   '[]'$   '[x]'$   Patient is known or suspected to have a digoxin level greater than 2 ng/ml: No results found for: "DIGOXIN"     '[]'$   '[x]'$   Creatinine clearance less than 20 ml/min (calculated using Cockcroft-Gault, actual body weight and serum creatinine): Estimated Creatinine Clearance: 78.5 mL/min (by C-G formula based on SCr of 0.75 mg/dL).     '[]'$   '[x]'$  Patient has received drugs known to prolong the QT intervals within the last 48 hours (phenothiazines, tricyclics or tetracyclic antidepressants, erythromycin, H-1 antihistamines, cisapride, fluoroquinolones, azithromycin, ondansetron).   Updated information on QT prolonging agents is available to be searched on the following database:QT prolonging agents     '[]'$   '[x]'$   Patient received a dose of hydrochlorothiazide (Oretic) alone or in any combination including triamterene (Dyazide, Maxzide) in the last 48 hours.    '[]'$   '[x]'$  Patient received a medication known to increase dofetilide plasma concentrations prior to initial dofetilide dose:  Trimethoprim (Primsol, Proloprim) in the last 36 hours Verapamil (Calan, Verelan)  in the last 36 hours or a sustained release dose in the last 72 hours Megestrol (Megace) in the last 5 days  Cimetidine (Tagamet) in the last 6 hours Ketoconazole (Nizoral) in the last 24 hours Itraconazole (Sporanox) in the last 48 hours  Prochlorperazine (Compazine) in the last 36 hours     '[]'$   '[x]'$   Patient is known to have a history of torsades de pointes; congenital or acquired long QT syndromes.    '[]'$   '[x]'$   Patient has received a Class 1 antiarrhythmic with less than 2 half-lives since last dose. (Disopyramide, Quinidine, Procainamide, Lidocaine, Mexiletine, Flecainide, Propafenone)    '[]'$   '[x]'$   Patient has received amiodarone therapy in the past 3 months or amiodarone level is greater than 0.3 ng/ml.    Labs:    Component Value Date/Time   K 3.6 12/13/2021 1248   MG 2.2 12/13/2021 1248     Plan: Select One Calculated CrCl  Dose q12h  '[x]'$  > 60 ml/min 500 mcg  '[]'$  40-60 ml/min 250 mcg  '[]'$  20-40 ml/min 125 mcg   '[x]'$   Physician selected initial dose within range recommended for patients level of renal function - will monitor for response.  '[]'$   Physician selected initial dose outside of range recommended for patients level of renal function - will discuss if the dose should be altered at this time.   Patient has been appropriately anticoagulated with apixaban.  Potassium: K >/= 4: Appropriate to initiate Tikosyn, no replacement needed    Magnesium: Mg 2.2: no replacement needed  Onnie Boer, PharmD, BCIDP, AAHIVP, CPP Infectious Disease Pharmacist 12/13/2021 3:57 PM

## 2021-12-13 NOTE — Progress Notes (Incomplete)
Pharmacy: Dofetilide (Tikosyn) - Initial Consult Assessment and Electrolyte Replacement  Pharmacy consulted to assist in monitoring and replacing electrolytes in this 71 y.o. female admitted on 12/13/2021 undergoing dofetilide initiation.   Assessment:  Patient Exclusion Criteria: If any screening criteria checked as "Yes", then  patient  should NOT receive dofetilide until criteria item is corrected.  If "Yes" please indicate correction plan.  YES  NO Patient  Exclusion Criteria Correction Plan   '[]'$   '[]'$   Baseline QTc interval is greater than or equal to 440 msec. IF above YES box checked dofetilide contraindicated unless patient has ICD; then may proceed if QTc 500-550 msec or with known ventricular conduction abnormalities may proceed with QTc 550-600 msec. QTc =   QTc pending   '[]'$   '[x]'$   Patient is known or suspected to have a digoxin level greater than 2 ng/ml: No results found for: "DIGOXIN"     '[]'$   '[x]'$   Creatinine clearance less than 20 ml/min (calculated using Cockcroft-Gault, actual body weight and serum creatinine): Estimated Creatinine Clearance: 78.5 mL/min (by C-G formula based on SCr of 0.7 mg/dL).     '[]'$   '[x]'$  Patient has received drugs known to prolong the QT intervals within the last 48 hours (phenothiazines, tricyclics or tetracyclic antidepressants, erythromycin, H-1 antihistamines, cisapride, fluoroquinolones, azithromycin, ondansetron).   Updated information on QT prolonging agents is available to be searched on the following database:QT prolonging agents     '[]'$   '[x]'$   Patient received a dose of hydrochlorothiazide (Oretic) alone or in any combination including triamterene (Dyazide, Maxzide) in the last 48 hours.    '[]'$   '[x]'$  Patient received a medication known to increase dofetilide plasma concentrations prior to initial dofetilide dose:  Trimethoprim (Primsol, Proloprim) in the last 36 hours Verapamil (Calan, Verelan) in the last 36 hours or a sustained  release dose in the last 72 hours Megestrol (Megace) in the last 5 days  Cimetidine (Tagamet) in the last 6 hours Ketoconazole (Nizoral) in the last 24 hours Itraconazole (Sporanox) in the last 48 hours  Prochlorperazine (Compazine) in the last 36 hours     '[]'$   '[x]'$   Patient is known to have a history of torsades de pointes; congenital or acquired long QT syndromes.    '[]'$   '[x]'$   Patient has received a Class 1 antiarrhythmic with less than 2 half-lives since last dose. (Disopyramide, Quinidine, Procainamide, Lidocaine, Mexiletine, Flecainide, Propafenone)    '[]'$   '[x]'$   Patient has received amiodarone therapy in the past 3 months or amiodarone level is greater than 0.3 ng/ml.    Labs:    Component Value Date/Time   K 4.6 12/12/2021 0831   MG 2.1 12/08/2015 0945     Plan: Select One Calculated CrCl  Dose q12h  '[x]'$  > 60 ml/min 500 mcg  '[]'$  40-60 ml/min 250 mcg  '[]'$  20-40 ml/min 125 mcg   '[x]'$   Physician selected initial dose within range recommended for patients level of renal function - will monitor for response.  '[]'$   Physician selected initial dose outside of range recommended for patients level of renal function - will discuss if the dose should be altered at this time.   Patient has been appropriately anticoagulated with apixaban.  Potassium: K >/= 4: Appropriate to initiate Tikosyn, no replacement needed    Magnesium:    Thank you for allowing pharmacy to participate in this patient's care   Hildred Laser, PharmD Clinical Pharmacist **Pharmacist phone directory can now be found on [email or web address] ([email or web address]).  Listed  under [place].

## 2021-12-13 NOTE — Transfer of Care (Signed)
Immediate Anesthesia Transfer of Care Note  Patient: Shannon [name]  Procedure(s) Performed: ATRIAL FIBRILLATION ABLATION  Patient Location: PACU and Cath Lab  Anesthesia Type:General  Level of Consciousness: drowsy, patient cooperative, and responds to stimulation  Airway & Oxygen Therapy: Patient Spontanous Breathing  Post-op Assessment: Report given to RN and Post -op Vital signs reviewed and stable  Post vital signs: Reviewed and stable  Last Vitals:  Vitals Value Taken Time  BP    Temp    Pulse 68 12/13/21 1148  Resp 14 12/13/21 1148  SpO2 93 % 12/13/21 1148  Vitals shown include unvalidated device data.  Last Pain:  Vitals:   12/13/21 0928  TempSrc:   PainSc: 7       Patients Stated Pain Goal: 4 (61/68/37 2902)  Complications: There were no known notable events for this encounter.

## 2021-12-13 NOTE — Discharge Instructions (Signed)
Post procedure care instructions No driving for 4 days. No lifting over 5 lbs for 1 week. No vigorous or sexual activity for 1 week. You may return to work/your usual activities on 12/21/21. Keep procedure site clean & dry. If you notice increased pain, swelling, bleeding or pus, call/return!  You may shower after 24 hours, but no soaking in baths/hot tubs/pools for 1 week.    You have an appointment set up with the [facility].  Multiple studies have shown that being followed by a dedicated atrial fibrillation clinic in addition to the standard care you receive from your other physicians improves health. We believe that enrollment in the atrial fibrillation clinic will allow [place] to better care for you.   The phone number to the [facility] is [phone]. The clinic is staffed Monday through Friday from 8:30am to 5pm.  Directions: The clinic is located in the [facility] [place] the hospital at the MAIN ENTRANCE "A", use [address] to the 6th floor.  Registration desk to the right of elevators on 6th floor  If you have any trouble locating the clinic, please don't hesitate to call [phone].
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
## 2021-12-13 NOTE — Anesthesia Procedure Notes (Signed)
Procedure Name: Intubation Date/Time: 12/13/2021 10:21 AM  Performed by: Janace Litten, CRNAPre-anesthesia Checklist: Patient identified, Emergency Drugs available, Suction available and Patient being monitored Patient Re-evaluated:Patient Re-evaluated prior to induction Oxygen Delivery Method: Circle System Utilized Preoxygenation: Pre-oxygenation with 100% oxygen Induction Type: IV induction Ventilation: Mask ventilation without difficulty Laryngoscope Size: Mac and 3 Grade View: Grade II Tube type: Oral Tube size: 7.0 mm Number of attempts: 1 Airway Equipment and Method: Stylet Placement Confirmation: ETT inserted through vocal cords under direct vision, positive ETCO2 and breath sounds checked- equal and bilateral Secured at: 22 cm Tube secured with: Tape Dental Injury: Teeth and Oropharynx as per pre-operative assessment

## 2021-12-13 NOTE — Anesthesia Postprocedure Evaluation (Signed)
Anesthesia Post Note  Patient: Shannon [name]  Procedure(s) Performed: ATRIAL FIBRILLATION ABLATION     Patient location during evaluation: Cath Lab Anesthesia Type: General Level of consciousness: awake and alert Pain management: pain level controlled Vital Signs Assessment: post-procedure vital signs reviewed and stable Respiratory status: spontaneous breathing, nonlabored ventilation and respiratory function stable Cardiovascular status: blood pressure returned to baseline and stable Postop Assessment: no apparent nausea or vomiting Anesthetic complications: no  There were no known notable events for this encounter.  Last Vitals:  Vitals:   12/13/21 1215 12/13/21 1236  BP: (!) 181/61 (!) 151/81  Pulse: 70 69  Resp: (!) 28   Temp:  36.8 C  SpO2: 96% 98%    Last Pain:  Vitals:   12/13/21 1236  TempSrc: Oral  PainSc: 9                  Lilliane Sposito,W. EDMOND

## 2021-12-13 NOTE — TOC Benefit Eligibility Note (Signed)
Patient Teacher, English as a foreign language completed.    The patient is currently admitted and upon discharge could be taking Tikosyn.  The current 30 day co-pay is $12.87.   The patient is insured through [facility] Part D

## 2021-12-13 NOTE — Care Management (Signed)
  Transition of Care [facility]) Screening Note   Patient Details  Name: [name] Date of Birth: Dec 06, 1950   Transition of Care [facility]) CM/SW Contact:    Bethena Roys, RN Phone Number: 12/13/2021, 1:53 PM    Transition of Care Department [facility]) has reviewed the patient. Patient presented for Tikosyn Load. Benefits check submitted for cost. Case Manager will follow for cost and pharmacy of choice as the patient progresses.

## 2021-12-14 DIAGNOSIS — I4819 Other persistent atrial fibrillation: Principal | ICD-10-CM

## 2021-12-14 LAB — MAGNESIUM: Magnesium: 2.1 mg/dL (ref 1.7–2.4)

## 2021-12-14 LAB — BASIC METABOLIC PANEL
Anion gap: 9 (ref 5–15)
BUN: 10 mg/dL (ref 8–23)
CO2: 23 mmol/L (ref 22–32)
Calcium: 8.8 mg/dL — ABNORMAL LOW (ref 8.9–10.3)
Chloride: 106 mmol/L (ref 98–111)
Creatinine, Ser: 0.64 mg/dL (ref 0.44–1.00)
GFR, Estimated: >60 mL/min (ref >60)
Glucose, Bld: 117 mg/dL — ABNORMAL HIGH (ref 70–99)
Potassium: 3.9 mmol/L (ref 3.5–5.1)
Sodium: 138 mmol/L (ref 135–145)

## 2021-12-14 MED ORDER — CARVEDILOL 12.5 MG PO TABS
12.5000 mg | ORAL_TABLET | Freq: Two times a day (BID) | ORAL | Status: DC
  Administered 2021-12-14 – 2021-12-16 (×4): 12.5 mg via ORAL
  Filled 2021-12-14 (×4): qty 1

## 2021-12-14 MED ORDER — DOFETILIDE 250 MCG PO CAPS: 250.0000 ug | ORAL_CAPSULE | Freq: Two times a day (BID) | ORAL | Status: DC

## 2021-12-14 MED ORDER — AMLODIPINE BESYLATE 10 MG PO TABS
10.0000 mg | ORAL_TABLET | Freq: Every day | ORAL | Status: DC
  Administered 2021-12-15 – 2021-12-16 (×2): 10 mg via ORAL
  Filled 2021-12-14 (×2): qty 1

## 2021-12-14 MED ORDER — POTASSIUM CHLORIDE CRYS ER 20 MEQ PO TBCR
40.0000 meq | EXTENDED_RELEASE_TABLET | Freq: Once | ORAL | Status: AC
  Administered 2021-12-14: 40 meq via ORAL
  Filled 2021-12-14: qty 2

## 2021-12-14 NOTE — Progress Notes (Signed)
EP Brief Update  Post dose #2 ECG reviewed from this AM.  QTc after this morning's dose manually measured at 515m.  Will stop dofetilide today and reassess ECG measurements in AM.  Will reconsider lower dose vs alternative AAD after ECG in AM.  CLysbeth GalasT. LQuentin Ore MD, FBrooklyn Eye Surgery Center LLC FSagamore Surgical Services IncCardiac Electrophysiology

## 2021-12-14 NOTE — Progress Notes (Signed)
EP Rounding Note    Patient Name: Shannon [name] Full Date of Encounter: 12/14/2021  [place] Cardiologist: None   Subjective   NAEO. QTc 480-490 this morning.  Inpatient Medications    Scheduled Meds:  [START ON 12/15/2021] amLODipine  10 mg Oral Daily   apixaban  5 mg Oral BID   carvedilol  25 mg Oral BID   colchicine  0.6 mg Oral BID   dofetilide  250 mcg Oral BID   pantoprazole  40 mg Oral Daily   simvastatin  20 mg Oral QHS   sodium chloride flush  3 mL Intravenous Q12H   sodium chloride flush  3 mL Intravenous Q12H   Continuous Infusions:  sodium chloride     sodium chloride     PRN Meds: sodium chloride, sodium chloride, acetaminophen, LORazepam, ondansetron (ZOFRAN) IV, sodium chloride flush, sodium chloride flush   Vital Signs    Vitals:   12/13/21 2007 12/14/21 0051 12/14/21 0521 12/14/21 0800  BP: (!) 159/77 (!) 150/67 (!) 156/80 (!) 160/71  Pulse: 71 65 69 61  Resp: '16 18 17 16  '$ Temp: 98.5 F (36.9 C) 98.9 F (37.2 C) 99 F (37.2 C) 98.4 F (36.9 C)  TempSrc: Oral Oral Oral Oral  SpO2: 97% 93% 98% 98%  Weight:   96 kg   Height:        Intake/Output Summary (Last 24 hours) at 12/14/2021 0943 Last data filed at 12/13/2021 1230 Gross per 24 hour  Intake 650 ml  Output --  Net 650 ml      12/14/2021    5:21 AM 12/13/2021    9:26 AM 11/22/2021    2:08 PM  Last 3 Weights  Weight (lbs) 211 lb 10.3 oz 215 lb 215 lb  Weight (kg) 96 kg 97.523 kg 97.523 kg      Telemetry    Personally Reviewed  ECG     Sinus.  Personally Reviewed  Physical Exam   GEN: No acute distress.   Neck: No JVD Cardiac: RRR, no murmurs, rubs, or gallops.  Respiratory: Clear to auscultation bilaterally. GI: Soft, nontender, non-distended  MS: No edema; No deformity. Neuro:  Nonfocal  Psych: Normal affect   Labs    High Sensitivity Troponin:  No results for input(s): "TROPONINIHS" in the last 720 hours.   Chemistry Recent Labs  Lab  12/12/21 0831 12/13/21 1248 12/14/21 0139  NA 139 140 138  K 4.6 3.6 3.9  CL 102 107 106  CO2 30* 25 23  GLUCOSE 114* 135* 117*  BUN '12 10 10  '$ CREATININE 0.70 0.75 0.64  CALCIUM 9.5 8.7* 8.8*  MG  --  2.2 2.1  GFRNONAA  --  >60 >60  ANIONGAP  --  8 9    Lipids No results for input(s): "CHOL", "TRIG", "HDL", "LABVLDL", "LDLCALC", "CHOLHDL" in the last 168 hours.  Hematology Recent Labs  Lab 12/12/21 0831  WBC 7.9  RBC 4.24  HGB 12.3  HCT 37.9  MCV 89  MCH 29.0  MCHC 32.5  RDW 14.1  PLT 302   Thyroid No results for input(s): "TSH", "FREET4" in the last 168 hours.  BNPNo results for input(s): "BNP", "PROBNP" in the last 168 hours.  DDimer No results for input(s): "DDIMER" in the last 168 hours.   Radiology    EP STUDY  Result Date: 12/13/2021 CONCLUSIONS: 1. Successful PVI (reisolation of the right pulmonary veins) 2. Successful ablation/isolation of the posterior wall 3. Intracardiac echo reveals trivial  pericardial effusion, normal left atrial architecture 4. No early apparent complications. 5.  Colchicine 0.6 mg by mouth twice daily for 5 days 6.  Protonix 40 mg by mouth once daily for 45 days 7.  Plan to keep the patient for a dofetilide load over the weekend.      Assessment & Plan    #Persistent AF S/p redo ablation 12/13/2021. Starting dofetilide with first dose 12/8 PM. I Have decreased this evening's dose to 273mg PO BID given QTc trend. Continue OCecil #Post ablation OAC Colchicine Protonix  #HTN Elevated Increase amlodipine  For questions or updates, please contact CHermitagePlease consult [email or web address] for contact info under        Signed, CVickie Epley MD  12/14/2021, 9:43 AM

## 2021-12-14 NOTE — Progress Notes (Signed)
Pharmacy: Dofetilide (Tikosyn) - Follow Up Assessment and Electrolyte Replacement  Pharmacy consulted to assist in monitoring and replacing electrolytes in this 71 y.o. female admitted on 12/13/2021 undergoing dofetilide initiation. First dofetilide dose: 12/8 500 mcg BID; dose decreased to 250 mcg BID starting PM of 12/9.   Labs:    Component Value Date/Time   K 3.9 12/14/2021 0139   MG 2.1 12/14/2021 0139     Plan: Potassium: K 3.8-3.9:  Give KCl 40 mEq po x1   Magnesium: Mg > 2: No additional supplementation needed  Thank you for allowing pharmacy to participate in this patient's care   Francena Hanly, PharmD Pharmacy Resident  12/14/2021 9:56 AM

## 2021-12-15 LAB — BASIC METABOLIC PANEL
Anion gap: 8 (ref 5–15)
BUN: 14 mg/dL (ref 8–23)
CO2: 24 mmol/L (ref 22–32)
Calcium: 8.7 mg/dL — ABNORMAL LOW (ref 8.9–10.3)
Chloride: 107 mmol/L (ref 98–111)
Creatinine, Ser: 0.73 mg/dL (ref 0.44–1.00)
GFR, Estimated: >60 mL/min (ref >60)
Glucose, Bld: 116 mg/dL — ABNORMAL HIGH (ref 70–99)
Potassium: 4 mmol/L (ref 3.5–5.1)
Sodium: 139 mmol/L (ref 135–145)

## 2021-12-15 LAB — MAGNESIUM: Magnesium: 2.2 mg/dL (ref 1.7–2.4)

## 2021-12-15 MED ORDER — DOFETILIDE 250 MCG PO CAPS
250.0000 ug | ORAL_CAPSULE | Freq: Two times a day (BID) | ORAL | Status: DC
  Administered 2021-12-15: 250 ug via ORAL
  Filled 2021-12-15: qty 1

## 2021-12-15 NOTE — Progress Notes (Signed)
EP Rounding Note    Patient Name: Shannon [name] Date of Encounter: 12/15/2021  [place] Cardiologist: None   Subjective   NAEO. QTc 452m this AM.  Inpatient Medications    Scheduled Meds:  amLODipine  10 mg Oral Daily   apixaban  5 mg Oral BID   carvedilol  12.5 mg Oral BID   colchicine  0.6 mg Oral BID   dofetilide  250 mcg Oral BID   pantoprazole  40 mg Oral Daily   simvastatin  20 mg Oral QHS   sodium chloride flush  3 mL Intravenous Q12H   sodium chloride flush  3 mL Intravenous Q12H   Continuous Infusions:  sodium chloride     sodium chloride     PRN Meds: sodium chloride, sodium chloride, acetaminophen, LORazepam, ondansetron (ZOFRAN) IV, sodium chloride flush, sodium chloride flush   Vital Signs    Vitals:   12/14/21 1943 12/14/21 2150 12/15/21 0451 12/15/21 0827  BP: (!) 138/56 (!) 147/67 (!) 169/72 (!) 173/79  Pulse: 65  66 69  Resp: '18  16 17  '$ Temp: 98.4 F (36.9 C)  99 F (37.2 C) 98.4 F (36.9 C)  TempSrc: Oral  Oral Oral  SpO2:    100%  Weight:      Height:       No intake or output data in the 24 hours ending 12/15/21 0913     12/14/2021    5:21 AM 12/13/2021    9:26 AM 11/22/2021    2:08 PM  Last 3 Weights  Weight (lbs) 211 lb 10.3 oz 215 lb 215 lb  Weight (kg) 96 kg 97.523 kg 97.523 kg      Telemetry    Personally Reviewed  ECG     Sinus.  Personally Reviewed  Physical Exam   GEN: No acute distress.   Neck: No JVD Cardiac: RRR, no murmurs, rubs, or gallops.  Respiratory: Clear to auscultation bilaterally. GI: Soft, nontender, non-distended  MS: No edema; No deformity. Neuro:  Nonfocal  Psych: Normal affect   Labs    High Sensitivity Troponin:  No results for input(s): "TROPONINIHS" in the last 720 hours.   Chemistry Recent Labs  Lab 12/13/21 1248 12/14/21 0139 12/15/21 0136  NA 140 138 139  K 3.6 3.9 4.0  CL 107 106 107  CO2 '25 23 24  '$ GLUCOSE 135* 117* 116*  BUN '10 10 14   '$ CREATININE 0.75 0.64 0.73  CALCIUM 8.7* 8.8* 8.7*  MG 2.2 2.1 2.2  GFRNONAA >60 >60 >60  ANIONGAP '8 9 8     '$ Lipids No results for input(s): "CHOL", "TRIG", "HDL", "LABVLDL", "LDLCALC", "CHOLHDL" in the last 168 hours.  Hematology Recent Labs  Lab 12/12/21 0831  WBC 7.9  RBC 4.24  HGB 12.3  HCT 37.9  MCV 89  MCH 29.0  MCHC 32.5  RDW 14.1  PLT 302    Thyroid No results for input(s): "TSH", "FREET4" in the last 168 hours.  BNPNo results for input(s): "BNP", "PROBNP" in the last 168 hours.  DDimer No results for input(s): "DDIMER" in the last 168 hours.   Radiology    EP STUDY  Result Date: 12/13/2021 CONCLUSIONS: 1. Successful PVI (reisolation of the right pulmonary veins) 2. Successful ablation/isolation of the posterior wall 3. Intracardiac echo reveals trivial pericardial effusion, normal left atrial architecture 4. No early apparent complications. 5.  Colchicine 0.6 mg by mouth twice daily for 5 days 6.  Protonix 40 mg by mouth  once daily for 45 days 7.  Plan to keep the patient for a dofetilide load over the weekend.      Assessment & Plan    #Persistent AF S/p redo ablation 12/13/2021. Starting dofetilide with first dose 12/8 PM. Decrease dofetilide to 225mg PO BID this AM Continue OAC  #Post ablation OAC Colchicine Protonix  #HTN Elevated Increase amlodipine  For questions or updates, please contact CCavalierPlease consult [email or web address] for contact info under        Signed, CVickie Epley MD  12/15/2021, 9:13 AM

## 2021-12-15 NOTE — Progress Notes (Signed)
Pt has home CPAP unit, no assistance needed.

## 2021-12-16 ENCOUNTER — Other Ambulatory Visit (HOSPITAL_COMMUNITY): Payer: Self-pay

## 2021-12-16 ENCOUNTER — Ambulatory Visit: Payer: Medicare Other

## 2021-12-16 LAB — MAGNESIUM: Magnesium: 2 mg/dL (ref 1.7–2.4)

## 2021-12-16 LAB — BASIC METABOLIC PANEL
Anion gap: 9 (ref 5–15)
BUN: 11 mg/dL (ref 8–23)
CO2: 25 mmol/L (ref 22–32)
Calcium: 8.7 mg/dL — ABNORMAL LOW (ref 8.9–10.3)
Chloride: 104 mmol/L (ref 98–111)
Creatinine, Ser: 0.64 mg/dL (ref 0.44–1.00)
GFR, Estimated: >60 mL/min (ref >60)
Glucose, Bld: 101 mg/dL — ABNORMAL HIGH (ref 70–99)
Potassium: 3.8 mmol/L (ref 3.5–5.1)
Sodium: 138 mmol/L (ref 135–145)

## 2021-12-16 MED ORDER — POTASSIUM CHLORIDE CRYS ER 20 MEQ PO TBCR
40.0000 meq | EXTENDED_RELEASE_TABLET | Freq: Once | ORAL | Status: AC
  Administered 2021-12-16: 40 meq via ORAL
  Filled 2021-12-16: qty 2

## 2021-12-16 MED ORDER — AMIODARONE HCL 200 MG PO TABS
ORAL_TABLET | ORAL | 0 refills | Status: DC
  Filled 2021-12-16: qty 110, 100d supply, fill #0

## 2021-12-16 MED ORDER — COLCHICINE 0.6 MG PO TABS
0.6000 mg | ORAL_TABLET | Freq: Two times a day (BID) | ORAL | 0 refills | Status: DC
  Filled 2021-12-16: qty 6, 3d supply, fill #0

## 2021-12-16 MED ORDER — CARVEDILOL 12.5 MG PO TABS
12.5000 mg | ORAL_TABLET | Freq: Two times a day (BID) | ORAL | 6 refills | Status: DC
  Filled 2021-12-16 – 2022-01-13 (×3): qty 60, 30d supply, fill #0
  Filled 2022-02-04 – 2022-02-08 (×2): qty 60, 30d supply, fill #1
  Filled 2022-03-14: qty 60, 30d supply, fill #2
  Filled 2022-09-01: qty 60, 30d supply, fill #3

## 2021-12-16 MED ORDER — PANTOPRAZOLE SODIUM 40 MG PO TBEC
40.0000 mg | DELAYED_RELEASE_TABLET | Freq: Every day | ORAL | 0 refills | Status: DC
  Filled 2021-12-16: qty 45, 45d supply, fill #0

## 2021-12-16 MED ORDER — AMLODIPINE BESYLATE 10 MG PO TABS
10.0000 mg | ORAL_TABLET | Freq: Every day | ORAL | 6 refills | Status: DC
  Filled 2021-12-16 – 2022-01-13 (×3): qty 30, 30d supply, fill #0

## 2021-12-16 NOTE — Discharge Summary (Signed)
ELECTROPHYSIOLOGY PROCEDURE DISCHARGE SUMMARY    Patient ID: Shannon [name],  MRN: [id number], DOB/AGE: November 02, 1950 71 y.o.  Admit date: 12/13/2021 Discharge date: 12/16/2021  Primary Care Physician: Michela Pitcher, NP  Primary Cardiologist: None  Electrophysiologist: Dr. Quentin Ore   Primary Discharge Diagnosis:  Persistent Atrial Fibrillation  Secondary Discharge Diagnosis:  HTN  Procedures This Admission:  Electrophysiology study and radiofrequency catheter ablation of Atrial Fibrillation on 12/8 by Dr. Quentin Ore .  This study demonstrated;.   1. Successful PVI (reisolation of the right pulmonary veins) 2. Successful ablation/isolation of the posterior wall 3. Intracardiac echo reveals trivial pericardial effusion, normal left atrial architecture 4. No early apparent complications. 5.  Colchicine 0.6 mg by mouth twice daily for 5 days 6.  Protonix 40 mg by mouth once daily for 45 days 7.  Plan to keep the patient for a dofetilide load over the weekend.     Brief HPI: Shannon [name] is a 71 y.o. female with a history of Atrial Fibrillation.  They have failed medical therapy with flecainide, and two prior ablations. Risks, benefits, and alternatives to catheter ablation of Atrial Fibrillation were reviewed with the patient who wished to proceed.   The patient has been on uninterrupted anticoagulation for more than 3 weeks and did not require TEE.  Hospital Course:  The patient was admitted and underwent EPS/RFCA of Atrial Fibrillation with details as outlined above.  They were monitored on telemetry overnight which demonstrated NSR. With on-going issues with AF, decision was also made to load on tikosyn over the weekend post ablation.  She had QT lengthening on 500 mcg leading to a held dose, and again had concerning QT changes when resumed at 250 mcg. Tikosyn was stopped and plan to start amiodarone after appropriate washout, on 12/13. BID dosing x 10 days, then daily  until follow up. Groin was without complication on the day of discharge.  The patient was examined  by Dr. Quentin Ore and considered to be stable for discharge.  Wound care and restrictions were reviewed with the patient.  The patient will be seen back by Afib Clinic in 4 weeks and Dr. Quentin Ore in 12 weeks for post ablation follow up.   CHA2DS2/VAS Stroke Risk Points  Current as of about an hour ago    6 >= 2 Points: High Risk 1 - 1.99 Points: Medium Risk 0 Points: Low Risk   Last Change:     Details   This score determines the patient's risk of having a stroke if the patient has atrial fibrillation.    Points Metrics 1 Has Congestive Heart Failure:  Yes   Current as of about an hour ago 0 Has Vascular Disease:  No   Current as of about an hour ago 1 Has Hypertension:  Yes   Current as of about an hour ago 1 Age:  32   Current as of about an hour ago 0 Has Diabetes:  No   Current as of about an hour ago 2 Had Stroke:  Yes  Had TIA:  No  Had Thromboembolism:  No   Current as of about an hour ago 1 Female:  Yes   Current as of about an hour ago       Physical Exam: Vitals:   12/15/21 0451 12/15/21 0827 12/15/21 1620 12/16/21 0622  BP: (!) 169/72 (!) 173/79 (!) 150/84 (!) 170/66  Pulse: 66 69 71 69  Resp: _0 Temp: 99 F (37.2 C)  98.4 F (36.9 C) 98.6 F (37 C) 99 F (37.2 C)  TempSrc: Oral Oral Oral Oral  SpO2:  100% 100% 97%  Weight:      Height:        GEN- The patient is well appearing, alert and oriented x 3 today.   HEENT: normocephalic, atraumatic; sclera clear, conjunctiva pink; hearing intact; oropharynx clear; neck supple  Lungs- Clear to ausculation bilaterally, normal work of breathing.  No wheezes, rales, rhonchi Heart- Regular rate and rhythm, no murmurs, rubs or gallops  GI- soft, non-tender, non-distended, bowel sounds present  Extremities- no clubbing, cyanosis, or edema; DP/PT/radial pulses 2+ bilaterally, groin without hematoma/bruit MS- no significant deformity or  atrophy Skin- warm and dry, no rash or lesion Psych- euthymic mood, full affect Neuro- strength and sensation are intact   Labs:   Lab Results  Component Value Date   WBC 7.9 12/12/2021   HGB 12.3 12/12/2021   HCT 37.9 12/12/2021   MCV 89 12/12/2021   PLT 302 12/12/2021    Recent Labs  Lab 12/16/21 0238  NA 138  K 3.8  CL 104  CO2 25  BUN 11  CREATININE 0.64  CALCIUM 8.7*  GLUCOSE 101*     Discharge Medications:  Allergies as of 12/16/2021       Reactions   Ace Inhibitors Cough   Elemental Sulfur Nausea And Vomiting   Hctz [hydrochlorothiazide] Other (See Comments)   Caused drug-induced LUPUS   Oxycodone Other (See Comments)   Hallucinations   Prednisone Other (See Comments)   Made patient very aggressive   Sulfa Antibiotics Nausea And Vomiting   Voltaren [diclofenac Sodium] Other (See Comments)   Made patient become aggressive        Medication List     TAKE these medications    acetaminophen 500 MG tablet Commonly known as: TYLENOL Take 1,000 mg by mouth every 6 (six) hours as needed for moderate pain or headache.   albuterol 108 (90 Base) MCG/ACT inhaler Commonly known as: VENTOLIN HFA Inhale 1-2 puffs into the lungs every 6 (six) hours as needed for wheezing or shortness of breath.   amiodarone 200 MG tablet Commonly known as: Pacerone Take 1 tablet (200 mg total) by mouth 2 (two) times daily for 10 days, THEN 1 tablet (200 mg total) daily. Start taking on: December 16, 2021   amLODipine 10 MG tablet Commonly known as: NORVASC Take 1 tablet (10 mg total) by mouth daily. What changed:  medication strength how much to take   carvedilol 12.5 MG tablet Commonly known as: COREG Take 1 tablet (12.5 mg total) by mouth 2 (two) times daily. What changed:  medication strength how much to take   colchicine 0.6 MG tablet Take 1 tablet (0.6 mg total) by mouth 2 (two) times daily for 3 days.   OVZCHYIFO-27 EX Apply 1 application  topically as  needed (skin irritation/itching).   Eliquis 5 MG Tabs tablet Generic drug: apixaban TAKE 1 TABLET BY MOUTH TWICE  DAILY   loratadine 10 MG tablet Commonly known as: CLARITIN Take 10 mg by mouth daily as needed for allergies.   LORazepam 0.5 MG tablet Commonly known as: ATIVAN Take 1 tablet (0.5 mg total) by mouth every 8 (eight) hours as needed for anxiety.   meclizine 12.5 MG tablet Commonly known as: ANTIVERT Take 1 tablet (12.5 mg total) by mouth 3 (three) times daily as needed for dizziness.   Na Sulfate-K Sulfate-Mg Sulf 17.5-3.13-1.6 GM/177ML Soln Commonly known as:  Suprep Bowel Prep Kit Take 1 kit by mouth as directed. For colonoscopy prep   pantoprazole 40 MG tablet Commonly known as: PROTONIX Take 1 tablet (40 mg total) by mouth daily.   simvastatin 20 MG tablet Commonly known as: Zocor Take 1 tablet (20 mg total) by mouth daily. What changed: when to take this   triamcinolone 55 MCG/ACT Aero nasal inhaler Commonly known as: NASACORT Place 1 spray into the nose daily as needed (allergies).   Vitamin D 50 MCG (2000 UT) Caps Take 2,000 Units by mouth in the morning.        Disposition:    Follow-up Information     [facility] Follow up.   Specialty: Cardiology Why: on 1/15 at 1100 am for post ablation follow up.    6th floor of [facility]. Come in the main entrance and can be guided from there Contact information: [address] [phone] [place] [phone]                Duration of Discharge Encounter: Greater than 30 minutes including physician time.  Jacalyn Lefevre, PA-C  12/16/2021 8:29 AM

## 2021-12-16 NOTE — Care Management Important Message (Signed)
Important Message  Patient Details  Name: Seline Enzor MRN: [id number] Date of Birth: 04-13-1950   Medicare Important Message Given:  Yes     Shelda Altes 12/16/2021, 8:48 AM

## 2021-12-16 NOTE — Progress Notes (Signed)
Pharmacy: Dofetilide (Tikosyn) - Follow Up Assessment and Electrolyte Replacement  Pharmacy consulted to assist in monitoring and replacing electrolytes in this 71 y.o. female admitted on 12/13/2021 undergoing dofetilide initiation. First dofetilide dose: 12/8 500 mcg BID; dose decreased to 250 mcg BID starting PM of 12/9.    Labs:    Component Value Date/Time   K 3.8 12/16/2021 0238   MG 2.0 12/16/2021 0238     Plan: Potassium: K 3.8-3.9:  Give KCl 40 mEq po x1   Magnesium: Mg = 2: No additional supplementation needed   As patient has required on average 0-40 mEq of potassium replacement every day, recommend discharging patient with prescription for:  Potassium chloride 20 mEq  daily  Thank you for allowing pharmacy to participate in this patient's care   Marguerite Olea, [place] Pharmacist  12/16/2021 11:36 AM   [facility] pharmacy phone numbers are listed on [email or web address]

## 2021-12-17 ENCOUNTER — Encounter: Payer: Self-pay | Admitting: Nurse Practitioner

## 2021-12-17 ENCOUNTER — Ambulatory Visit (INDEPENDENT_AMBULATORY_CARE_PROVIDER_SITE_OTHER): Payer: Medicare Other | Admitting: Nurse Practitioner

## 2021-12-17 VITALS — BP 138/69 | HR 71 | Temp 98.5°F | Resp 16 | Ht 67.0 in | Wt 214.1 lb

## 2021-12-17 DIAGNOSIS — M791 Myalgia, unspecified site: Secondary | ICD-10-CM

## 2021-12-17 DIAGNOSIS — I10 Essential (primary) hypertension: Secondary | ICD-10-CM | POA: Diagnosis not present

## 2021-12-17 DIAGNOSIS — I4891 Unspecified atrial fibrillation: Secondary | ICD-10-CM

## 2021-12-17 DIAGNOSIS — R0609 Other forms of dyspnea: Secondary | ICD-10-CM

## 2021-12-17 NOTE — Assessment & Plan Note (Signed)
Patient just underwent third ablation.  Normal rhythm on auscultation today in office.  Continue Eliquis as prescribed

## 2021-12-17 NOTE — Assessment & Plan Note (Signed)
Still experiencing some dyspnea on exertion.  Patient states that cardiology is aware they are currently monitoring it.  She is requesting a power lift chair is when she tries to get up from a seated position she does have increased shortness of breath.  Also with her joint pains and helps to be a little higher up when she stands up.  DME order provided today for patient

## 2021-12-17 NOTE — Patient Instructions (Signed)
Nice to see you today I will see you in 3 months, sooner if you need me Let me know if the supply store needs more information in regards to the lift chair

## 2021-12-17 NOTE — Assessment & Plan Note (Signed)
Patient currently switched from amlodipine 5 mg to 10 mg.  Her carvedilol was also half dose.  Patient is to start amiodarone 200 mg tomorrow.  Blood pressure right below goal continue to monitor it.  Stable today

## 2021-12-17 NOTE — Assessment & Plan Note (Signed)
Did do a holiday on the statin which did not make great difference.  Patient will go back on her statin medication as prescribed.  They did recently decrease her carvedilol patient is optimistic this may have some effect on her overall pains.

## 2021-12-17 NOTE — Progress Notes (Signed)
Established Patient Office Visit  Subjective   Patient ID: Shannon [name], female    DOB: September 07, 1950  Age: 71 y.o. MRN: [id number]  Chief Complaint  Patient presents with   Medication Management    HPI  Afib: Patient recently underwent an ablation with Dr. Quentin Ore.  After ablation patient was admitted to the hospital to try to be put on Tikosyn medication.  Patient's QTc got too long.  Patient was to start amiodarone 200 mg twice a day for 10 days and then 200 mg daily thereafter.  Patient's amlodipine was also doubled and carvedilol [place] in dosing.  HTN: States that they did double the amlodipine. They also cute the carevdilol in half.  Patient states carvedilol may be contributing to her myalgias and joint pains.  Will give this a few weeks to see how she does  Pains: states that we did a vacation from the statin. States that she did feel som erelief but was not sure if it is the culprit  States that she needs a lift chair. States that when she gets up it is hard for her to get up and causes some shortness of breath and some joint pain. States that she does have a recliner and it will move. States that it does make it difficult getting up     Review of Systems  Constitutional:  Negative for chills and fever.  Respiratory:  Positive for shortness of breath (different with exertion cardiology knows).   Cardiovascular:  Negative for chest pain and leg swelling.      Objective:     BP 138/69   Pulse 71   Temp 98.5 F (36.9 C)   Resp 16   Ht '5\' 7"'$  (1.702 m)   Wt 214 lb 2 oz (97.1 kg)   SpO2 97%   BMI 33.54 kg/m    Physical Exam Vitals and nursing note reviewed.  Constitutional:      Appearance: Normal appearance.  Cardiovascular:     Rate and Rhythm: Normal rate and regular rhythm.  Pulmonary:     Effort: Pulmonary effort is normal.     Breath sounds: Normal breath sounds.  Neurological:     Mental Status: She is alert.      No results found for any  visits on 12/17/21.    The ASCVD Risk score (Arnett DK, et al., 2019) failed to calculate for the following reasons:   The patient has a prior MI or stroke diagnosis    Assessment & Plan:   Problem List Items Addressed This Visit       Cardiovascular and Mediastinum   Essential hypertension    Patient currently switched from amlodipine 5 mg to 10 mg.  Her carvedilol was also half dose.  Patient is to start amiodarone 200 mg tomorrow.  Blood pressure right below goal continue to monitor it.  Stable today      A-fib [facility])    Patient just underwent third ablation.  Normal rhythm on auscultation today in office.  Continue Eliquis as prescribed        Other   Myalgia - Primary    Did do a holiday on the statin which did not make great difference.  Patient will go back on her statin medication as prescribed.  They did recently decrease her carvedilol patient is optimistic this may have some effect on her overall pains.      Relevant Orders   For home use only DME Other see comment  DOE (dyspnea on exertion)    Still experiencing some dyspnea on exertion.  Patient states that cardiology is aware they are currently monitoring it.  She is requesting a power lift chair is when she tries to get up from a seated position she does have increased shortness of breath.  Also with her joint pains and helps to be a little higher up when she stands up.  DME order provided today for patient      Relevant Orders   For home use only DME Other see comment    Return in about 3 months (around 03/18/2022) for Recheck .    Romilda Garret, NP

## 2021-12-20 ENCOUNTER — Telehealth: Payer: Self-pay | Admitting: Cardiology

## 2021-12-20 ENCOUNTER — Telehealth (HOSPITAL_COMMUNITY): Payer: Self-pay | Admitting: *Deleted

## 2021-12-20 ENCOUNTER — Encounter: Payer: Self-pay | Admitting: Cardiology

## 2021-12-20 ENCOUNTER — Telehealth: Payer: Self-pay

## 2021-12-20 NOTE — Telephone Encounter (Signed)
Spoke to patient who advises she already spoke to [employer] in [place] clinic. Patient states she is feeling much better. She confirms she is taking her amiodarone twice a day for 10 days s/p ablation. She states she has had no diarrhea today and that she was advised by Beatris Si regarding her coreg.

## 2021-12-20 NOTE — Telephone Encounter (Signed)
Spoke with patient see separate telephone encounter.

## 2021-12-20 NOTE — Telephone Encounter (Signed)
Spoke to patient, see separate telephone note.

## 2021-12-20 NOTE — Telephone Encounter (Signed)
See separate telephone encounter. Spoke with pt.

## 2021-12-20 NOTE — Telephone Encounter (Signed)
Patient back in afib since yesterday evening. Also experienced significant diarrhea which has stopped. Hrs in the 90-140s BP 143/73. Discussed with Adline Peals PA will increase coreg '25mg'$  BID and call with update of rate/rhythm on Monday. If still in afib on Monday will bring in for assessment. Pt has also been instructed to stop cholchicine with diarrhea.  If she should become bradycardic on higher dose of coreg she will return to 12.'5mg'$  BID. Pt in agreement.

## 2021-12-20 NOTE — Telephone Encounter (Signed)
Patient c/o Palpitations:  High priority if patient c/o lightheadedness, shortness of breath, or chest pain  How long have you had palpitations/irregular HR/ Afib? Are you having the symptoms now? Yes   Are you currently experiencing lightheadedness, SOB or CP? No   Do you have a history of afib (atrial fibrillation) or irregular heart rhythm? Yes   Have you checked your BP or HR? (document readings if available): no   Are you experiencing any other symptoms? No

## 2021-12-23 ENCOUNTER — Ambulatory Visit (HOSPITAL_COMMUNITY): Payer: Medicare Other | Admitting: Nurse Practitioner

## 2022-01-07 ENCOUNTER — Ambulatory Visit (INDEPENDENT_AMBULATORY_CARE_PROVIDER_SITE_OTHER): Payer: PPO

## 2022-01-07 DIAGNOSIS — I480 Paroxysmal atrial fibrillation: Secondary | ICD-10-CM | POA: Diagnosis not present

## 2022-01-07 LAB — CUP PACEART REMOTE DEVICE CHECK
Date Time Interrogation Session: 20240101231929
Implantable Pulse Generator Implant Date: 20210301

## 2022-01-08 DIAGNOSIS — R457 State of emotional shock and stress, unspecified: Secondary | ICD-10-CM | POA: Diagnosis not present

## 2022-01-08 DIAGNOSIS — I10 Essential (primary) hypertension: Secondary | ICD-10-CM | POA: Diagnosis not present

## 2022-01-08 DIAGNOSIS — R0789 Other chest pain: Secondary | ICD-10-CM | POA: Diagnosis not present

## 2022-01-08 DIAGNOSIS — R079 Chest pain, unspecified: Secondary | ICD-10-CM | POA: Diagnosis not present

## 2022-01-09 ENCOUNTER — Other Ambulatory Visit: Payer: Self-pay | Admitting: Cardiology

## 2022-01-09 ENCOUNTER — Telehealth: Payer: Self-pay | Admitting: Nurse Practitioner

## 2022-01-09 ENCOUNTER — Other Ambulatory Visit (HOSPITAL_COMMUNITY): Payer: Self-pay

## 2022-01-09 ENCOUNTER — Other Ambulatory Visit: Payer: Self-pay

## 2022-01-09 DIAGNOSIS — F411 Generalized anxiety disorder: Secondary | ICD-10-CM

## 2022-01-09 DIAGNOSIS — I639 Cerebral infarction, unspecified: Secondary | ICD-10-CM

## 2022-01-09 MED ORDER — APIXABAN 5 MG PO TABS
5.0000 mg | ORAL_TABLET | Freq: Two times a day (BID) | ORAL | 3 refills | Status: DC
  Filled 2022-01-09: qty 180, 90d supply, fill #0

## 2022-01-09 MED ORDER — SIMVASTATIN 20 MG PO TABS
20.0000 mg | ORAL_TABLET | Freq: Every day | ORAL | 5 refills | Status: DC
  Filled 2022-01-09: qty 30, 30d supply, fill #0
  Filled 2022-01-12 – 2022-02-04 (×2): qty 30, 30d supply, fill #1
  Filled 2022-03-04: qty 30, 30d supply, fill #2

## 2022-01-09 MED ORDER — LORAZEPAM 0.5 MG PO TABS
0.5000 mg | ORAL_TABLET | Freq: Three times a day (TID) | ORAL | 2 refills | Status: DC | PRN
  Filled 2022-01-09: qty 60, 20d supply, fill #0

## 2022-01-09 NOTE — Progress Notes (Signed)
Carelink Summary Report / Loop Recorder 

## 2022-01-09 NOTE — Telephone Encounter (Signed)
Medications sent into pharmacy of request

## 2022-01-09 NOTE — Telephone Encounter (Signed)
Please advise on refill request, thank you!

## 2022-01-09 NOTE — Telephone Encounter (Signed)
Pt called stating she recently changed insurance companies & needed these meds: LORazepam (ATIVAN) 0.5 MG tablet, simvastatin (ZOCOR) 20 MG tablet & ELIQUIS 5 MG TABS tablet, sent to her new pharmacy with new prescriptions. Pt stated her new pharmacy is [facility]. Call back # [phone]

## 2022-01-10 ENCOUNTER — Other Ambulatory Visit: Payer: Self-pay

## 2022-01-13 ENCOUNTER — Other Ambulatory Visit: Payer: Self-pay

## 2022-01-13 ENCOUNTER — Other Ambulatory Visit (HOSPITAL_COMMUNITY): Payer: Self-pay

## 2022-01-13 ENCOUNTER — Telehealth (HOSPITAL_COMMUNITY): Payer: Self-pay

## 2022-01-13 MED ORDER — AMIODARONE HCL 200 MG PO TABS
ORAL_TABLET | ORAL | 0 refills | Status: DC
  Filled 2022-01-13: qty 110, 100d supply, fill #0

## 2022-01-13 MED ORDER — PANTOPRAZOLE SODIUM 40 MG PO TBEC
40.0000 mg | DELAYED_RELEASE_TABLET | Freq: Every day | ORAL | 0 refills | Status: DC
  Filled 2022-01-13: qty 45, 45d supply, fill #0

## 2022-01-13 NOTE — Telephone Encounter (Signed)
Patient states she has been in A-fib since yesterday. Symptoms short of breath and she has a slight headache. She had been on Carvedilol 25 mg tablet- taking 1/2 tablet twice daily. Patient increased her Carvedilol to 25 mg BID. Her first dose she had taken at 10 am today and she was told to take another 25 mg tonight around 10 pm. Blood pressure 126/69, HR 80-90s range. Patient instructed to contact the [facility] tomorrow to let us know how she is doing. Communicated with patient and she verbalized understanding.

## 2022-01-14 ENCOUNTER — Encounter: Payer: Self-pay | Admitting: Nurse Practitioner

## 2022-01-14 ENCOUNTER — Telehealth: Payer: Self-pay

## 2022-01-14 DIAGNOSIS — H903 Sensorineural hearing loss, bilateral: Secondary | ICD-10-CM | POA: Diagnosis not present

## 2022-01-14 NOTE — Telephone Encounter (Signed)
Pt continues in afib and feels very anxious. She would like to take an ativan and see if this helps her afib. Pt will take ativan now and call this afternoon with update. Educated pt on post-ablation expectations with breakthrough afib.

## 2022-01-14 NOTE — Telephone Encounter (Signed)
Pt feels she has returned to normal rhythm with HR in the 50s. Pt will return to normal dosing of coreg. Pt will call if further issues.

## 2022-01-14 NOTE — Telephone Encounter (Signed)
[name] alert for AF/Symptom episode, 01/12/22. No EGM for AF episode. Symptom episode showed AF with rates 100-120bpm with comment "LH, dizzy, fluttering, think I had too much sodium for dinner". AF appears to be ongoing with presenting rhythm AF with rates 100-110bpm (01/14/22) s/p AF ablation 12/13/21, [place]- Eliquis, on Amiodarone. Routing for further review, see open EPIC tele note- [name]   I see patient is being followed by her symptoms with the AF clinic.  Forwarding to their team for continued monitoring.

## 2022-01-15 ENCOUNTER — Ambulatory Visit
Admission: RE | Admit: 2022-01-15 | Discharge: 2022-01-15 | Disposition: A | Discharge status: Home / Self Care | Payer: PPO | Source: Ambulatory Visit | Attending: Nurse Practitioner | Admitting: Nurse Practitioner

## 2022-01-15 ENCOUNTER — Encounter: Payer: Self-pay | Admitting: Nurse Practitioner

## 2022-01-15 ENCOUNTER — Ambulatory Visit (INDEPENDENT_AMBULATORY_CARE_PROVIDER_SITE_OTHER): Payer: PPO | Admitting: Nurse Practitioner

## 2022-01-15 ENCOUNTER — Telehealth: Payer: Self-pay | Admitting: Nurse Practitioner

## 2022-01-15 VITALS — BP 124/72 | HR 60 | Ht 67.0 in | Wt 214.0 lb

## 2022-01-15 DIAGNOSIS — Z8673 Personal history of transient ischemic attack (TIA), and cerebral infarction without residual deficits: Secondary | ICD-10-CM | POA: Insufficient documentation

## 2022-01-15 DIAGNOSIS — H532 Diplopia: Secondary | ICD-10-CM

## 2022-01-15 NOTE — Telephone Encounter (Signed)
Spoke to patient by telephone and was advised the double vision that she had yesterday only lasted a short period of time. Patient stated that she had a dull headache all day yesterday, but that is gone today. Patient denies any other symptoms today. Patient stated that she has had some abdominal pain and is thinking that may be caused by some of the new medications that she has recently started. Patient scheduled an appointment this morning 01/15/22 at 10:40 am with Romilda Garret NP. Patient was given ER precautions and she verbalized understanding.

## 2022-01-15 NOTE — Assessment & Plan Note (Signed)
Unsure if monocular. CT head to r/o CVA vs. TIA

## 2022-01-15 NOTE — Patient Instructions (Addendum)
Nice to see you today If you develop similar symptoms with the others we talked about go be seen I will be in touch with the CT scan once I have the results

## 2022-01-15 NOTE — Telephone Encounter (Signed)
Pt called in need to know where she should go to get her CT done . Pt need's to know  if its [facility] or [place] appointment is in an hour

## 2022-01-15 NOTE — Telephone Encounter (Addendum)
Patient was provided with address and phone number to [facility]   Address was also given to front office staff while patient was calling in for information. Not sure why this was not relayed to patient.   ATC patient, no answer, left vm to return call.

## 2022-01-15 NOTE — Telephone Encounter (Signed)
Call and triage patient and if appropriate schedule her for a visit with me in office

## 2022-01-15 NOTE — Assessment & Plan Note (Signed)
History of stroke with afib while on anticoagulation. Stat CT head to r/o CVA vs TIA

## 2022-01-15 NOTE — Telephone Encounter (Signed)
Patient completed scan. Nothing further needed at this time.

## 2022-01-15 NOTE — Progress Notes (Signed)
Acute Office Visit  Subjective:     Patient ID: Shannon [name], female    DOB: [birth year], 72 y.o.   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 She is being followed by Dr. Leonie Man  Started yesterday evening states that she was having double vision  States that she has a history of a fib. Has been in it for several days and then converted. She did reach out to Afib clinic and was told to change medications. States that she self converted and has not missed a dose of the eliquis  States that she woke up and had an episode of diplopia that resolved in 5-10 minutes. Se is unsure if it is with one eye or both    Review of Systems  Constitutional:  Negative for chills and fever.  Eyes:  Positive for blurred vision and double vision. Negative for pain.  Respiratory:  Negative for shortness of breath.   Cardiovascular:  Negative for chest pain and palpitations.  Neurological:  Positive for headaches. Negative for tingling and weakness.        Objective:    BP 124/72   Pulse 60   Ht '5\' 7"'$  (1.702 m)   Wt 214 lb (97.1 kg)   SpO2 98%   BMI 33.52 kg/m    Physical Exam Vitals and nursing note reviewed.  Constitutional:      Appearance: Normal appearance.  HENT:     Mouth/Throat:     Mouth: Mucous membranes are moist.     Pharynx: Oropharynx is clear.  Eyes:     Extraocular Movements: Extraocular movements intact.     Pupils: Pupils are equal, round, and reactive to light.  Cardiovascular:     Rate and Rhythm: Normal rate and regular rhythm.     Heart sounds: Normal heart sounds.  Pulmonary:     Effort: Pulmonary effort is normal.     Breath sounds: Normal breath sounds.  Musculoskeletal:     Right lower leg: No edema.     Left  lower leg: No edema.  Skin:    General: Skin is warm.  Neurological:     General: No focal deficit present.     Mental Status: She is alert.     Cranial Nerves: Cranial nerves 2-12 are intact.     Sensory: Sensation is intact.     Coordination: Romberg sign positive. Finger-Nose-Finger Test normal.     Gait: Gait is intact.     Deep Tendon Reflexes:     Reflex Scores:      Bicep reflexes are 2+ on the right side and 2+ on the left side.      Patellar reflexes are 2+ on the right side and 2+ on the left side.    Comments: Bilateral upper and lower extremity strength 5/5     No results found for any visits on 01/15/22.      Assessment & Plan:   Problem List Items Addressed This Visit       Other   Diplopia    Unsure if monocular. CT head to r/o CVA vs. TIA      Relevant Orders   CT HEAD WO CONTRAST (5MM)   History of stroke -  Primary    History of stroke with afib while on anticoagulation. Stat CT head to r/o CVA vs TIA      Relevant Orders   CT HEAD WO CONTRAST (5MM)    No orders of the defined types were placed in this encounter.   No follow-ups on file.  Romilda Garret, NP

## 2022-01-20 ENCOUNTER — Ambulatory Visit (HOSPITAL_COMMUNITY)
Admission: RE | Admit: 2022-01-20 | Discharge: 2022-01-20 | Disposition: A | Discharge status: Home / Self Care | Payer: PPO | Source: Ambulatory Visit | Attending: Nurse Practitioner | Admitting: Nurse Practitioner

## 2022-01-20 ENCOUNTER — Other Ambulatory Visit: Payer: Self-pay

## 2022-01-20 ENCOUNTER — Other Ambulatory Visit (HOSPITAL_COMMUNITY): Payer: Self-pay

## 2022-01-20 ENCOUNTER — Encounter (HOSPITAL_COMMUNITY): Payer: Self-pay | Admitting: Nurse Practitioner

## 2022-01-20 VITALS — BP 162/72 | HR 55 | Ht 67.0 in | Wt 216.6 lb

## 2022-01-20 DIAGNOSIS — Z87891 Personal history of nicotine dependence: Secondary | ICD-10-CM | POA: Insufficient documentation

## 2022-01-20 DIAGNOSIS — Z7901 Long term (current) use of anticoagulants: Secondary | ICD-10-CM | POA: Diagnosis not present

## 2022-01-20 DIAGNOSIS — G4733 Obstructive sleep apnea (adult) (pediatric): Secondary | ICD-10-CM | POA: Diagnosis not present

## 2022-01-20 DIAGNOSIS — Z79899 Other long term (current) drug therapy: Secondary | ICD-10-CM | POA: Diagnosis not present

## 2022-01-20 DIAGNOSIS — I498 Other specified cardiac arrhythmias: Secondary | ICD-10-CM | POA: Insufficient documentation

## 2022-01-20 DIAGNOSIS — R0602 Shortness of breath: Secondary | ICD-10-CM | POA: Diagnosis present

## 2022-01-20 DIAGNOSIS — R0609 Other forms of dyspnea: Secondary | ICD-10-CM

## 2022-01-20 DIAGNOSIS — I10 Essential (primary) hypertension: Secondary | ICD-10-CM | POA: Diagnosis not present

## 2022-01-20 DIAGNOSIS — I4891 Unspecified atrial fibrillation: Secondary | ICD-10-CM | POA: Insufficient documentation

## 2022-01-20 DIAGNOSIS — Z8673 Personal history of transient ischemic attack (TIA), and cerebral infarction without residual deficits: Secondary | ICD-10-CM | POA: Insufficient documentation

## 2022-01-20 DIAGNOSIS — D6869 Other thrombophilia: Secondary | ICD-10-CM

## 2022-01-20 DIAGNOSIS — I480 Paroxysmal atrial fibrillation: Secondary | ICD-10-CM

## 2022-01-20 LAB — COMPREHENSIVE METABOLIC PANEL
ALT: 19 U/L (ref 0–44)
AST: 19 U/L (ref 15–41)
Albumin: 3.9 g/dL (ref 3.5–5.0)
Alkaline Phosphatase: 70 U/L (ref 38–126)
Anion gap: 11 (ref 5–15)
BUN: 11 mg/dL (ref 8–23)
CO2: 27 mmol/L (ref 22–32)
Calcium: 9.3 mg/dL (ref 8.9–10.3)
Chloride: 101 mmol/L (ref 98–111)
Creatinine, Ser: 0.88 mg/dL (ref 0.44–1.00)
GFR, Estimated: >60 mL/min (ref >60)
Glucose, Bld: 110 mg/dL — ABNORMAL HIGH (ref 70–99)
Potassium: 4.1 mmol/L (ref 3.5–5.1)
Sodium: 139 mmol/L (ref 135–145)
Total Bilirubin: 0.6 mg/dL (ref 0.3–1.2)
Total Protein: 7.5 g/dL (ref 6.5–8.1)

## 2022-01-20 LAB — TSH: TSH: 2.351 u[IU]/mL (ref 0.350–4.500)

## 2022-01-20 LAB — BRAIN NATRIURETIC PEPTIDE: B Natriuretic Peptide: 142.2 pg/mL — ABNORMAL HIGH (ref 0.0–100.0)

## 2022-01-20 MED ORDER — FUROSEMIDE 20 MG PO TABS
20.0000 mg | ORAL_TABLET | Freq: Every day | ORAL | 1 refills | Status: DC
  Filled 2022-01-20: qty 30, 30d supply, fill #0
  Filled 2022-02-04 – 2022-02-12 (×3): qty 30, 30d supply, fill #1

## 2022-01-20 MED ORDER — AMIODARONE HCL 200 MG PO TABS: ORAL_TABLET | ORAL | Status: DC

## 2022-01-20 MED ORDER — POTASSIUM CHLORIDE ER 10 MEQ PO TBCR
10.0000 meq | EXTENDED_RELEASE_TABLET | Freq: Every day | ORAL | 0 refills | Status: DC
  Filled 2022-01-20: qty 30, 30d supply, fill #0

## 2022-01-20 MED ORDER — AMLODIPINE BESYLATE 10 MG PO TABS: 5.0000 mg | ORAL_TABLET | Freq: Every day | ORAL | 6 refills | Status: DC

## 2022-01-20 NOTE — Patient Instructions (Addendum)
Decrease amlodipine to '5mg'$  once a day   Start lasix '20mg'$  once a day  Start potassium 37mq once a day
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
## 2022-01-23 DIAGNOSIS — H903 Sensorineural hearing loss, bilateral: Secondary | ICD-10-CM | POA: Diagnosis not present

## 2022-01-27 DIAGNOSIS — I10 Essential (primary) hypertension: Secondary | ICD-10-CM | POA: Diagnosis not present

## 2022-01-27 DIAGNOSIS — G4733 Obstructive sleep apnea (adult) (pediatric): Secondary | ICD-10-CM | POA: Diagnosis not present

## 2022-01-28 ENCOUNTER — Ambulatory Visit (HOSPITAL_COMMUNITY)
Admission: RE | Admit: 2022-01-28 | Discharge: 2022-01-28 | Disposition: A | Discharge status: Home / Self Care | Payer: PPO | Source: Ambulatory Visit | Attending: Nurse Practitioner | Admitting: Nurse Practitioner

## 2022-01-28 ENCOUNTER — Encounter (HOSPITAL_COMMUNITY): Payer: Self-pay | Admitting: Nurse Practitioner

## 2022-01-28 VITALS — BP 152/74 | HR 55 | Ht 67.0 in | Wt 216.0 lb

## 2022-01-28 DIAGNOSIS — R0602 Shortness of breath: Secondary | ICD-10-CM | POA: Insufficient documentation

## 2022-01-28 DIAGNOSIS — R001 Bradycardia, unspecified: Secondary | ICD-10-CM | POA: Insufficient documentation

## 2022-01-28 DIAGNOSIS — I4891 Unspecified atrial fibrillation: Secondary | ICD-10-CM | POA: Diagnosis not present

## 2022-01-28 DIAGNOSIS — D6869 Other thrombophilia: Secondary | ICD-10-CM | POA: Diagnosis not present

## 2022-01-28 DIAGNOSIS — H538 Other visual disturbances: Secondary | ICD-10-CM | POA: Diagnosis not present

## 2022-01-28 DIAGNOSIS — Z79899 Other long term (current) drug therapy: Secondary | ICD-10-CM | POA: Insufficient documentation

## 2022-01-28 DIAGNOSIS — I10 Essential (primary) hypertension: Secondary | ICD-10-CM | POA: Diagnosis not present

## 2022-01-28 DIAGNOSIS — Z8673 Personal history of transient ischemic attack (TIA), and cerebral infarction without residual deficits: Secondary | ICD-10-CM | POA: Insufficient documentation

## 2022-01-28 DIAGNOSIS — Z7901 Long term (current) use of anticoagulants: Secondary | ICD-10-CM | POA: Insufficient documentation

## 2022-01-28 LAB — BASIC METABOLIC PANEL
Anion gap: 7 (ref 5–15)
BUN: 9 mg/dL (ref 8–23)
CO2: 30 mmol/L (ref 22–32)
Calcium: 9.2 mg/dL (ref 8.9–10.3)
Chloride: 102 mmol/L (ref 98–111)
Creatinine, Ser: 0.81 mg/dL (ref 0.44–1.00)
GFR, Estimated: >60 mL/min (ref >60)
Glucose, Bld: 110 mg/dL — ABNORMAL HIGH (ref 70–99)
Potassium: 3.9 mmol/L (ref 3.5–5.1)
Sodium: 139 mmol/L (ref 135–145)

## 2022-01-28 MED ORDER — AMLODIPINE BESYLATE 5 MG PO TABS: 5.0000 mg | ORAL_TABLET | Freq: Every day | ORAL | Status: DC

## 2022-01-28 NOTE — Addendum Note (Signed)
Encounter addended by: Enid Derry, CMA on: 01/28/2022 4:46 PM  Actions taken: Order list changed
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
## 2022-01-28 NOTE — Addendum Note (Signed)
Encounter addended by: Sherran Needs, NP on: 01/28/2022 10:00 AM  Actions taken: Clinical Note Signed

## 2022-02-04 ENCOUNTER — Other Ambulatory Visit (HOSPITAL_COMMUNITY): Payer: Self-pay | Admitting: Nurse Practitioner

## 2022-02-04 ENCOUNTER — Other Ambulatory Visit (HOSPITAL_COMMUNITY): Payer: Self-pay

## 2022-02-04 ENCOUNTER — Other Ambulatory Visit: Payer: Self-pay

## 2022-02-04 MED ORDER — AMIODARONE HCL 200 MG PO TABS
200.0000 mg | ORAL_TABLET | Freq: Every day | ORAL | 3 refills | Status: DC
  Filled 2022-02-04: qty 30, fill #0
  Filled 2022-02-08 – 2022-02-15 (×3): qty 30, 30d supply, fill #0

## 2022-02-04 MED ORDER — POTASSIUM CHLORIDE ER 10 MEQ PO TBCR
10.0000 meq | EXTENDED_RELEASE_TABLET | Freq: Every day | ORAL | 3 refills | Status: DC
  Filled 2022-02-04 – 2022-02-12 (×3): qty 30, 30d supply, fill #0
  Filled 2022-03-04: qty 30, 30d supply, fill #1
  Filled 2022-04-07: qty 30, 30d supply, fill #2

## 2022-02-04 MED ORDER — AMLODIPINE BESYLATE 5 MG PO TABS
5.0000 mg | ORAL_TABLET | Freq: Every day | ORAL | 3 refills | Status: DC
  Filled 2022-02-04: qty 30, 30d supply, fill #0
  Filled 2022-03-04: qty 30, 30d supply, fill #1

## 2022-02-09 LAB — CUP PACEART REMOTE DEVICE CHECK
Date Time Interrogation Session: 20240203231357
Implantable Pulse Generator Implant Date: 20210301

## 2022-02-10 ENCOUNTER — Ambulatory Visit: Payer: PPO

## 2022-02-10 ENCOUNTER — Other Ambulatory Visit (HOSPITAL_COMMUNITY): Payer: Self-pay

## 2022-02-10 ENCOUNTER — Other Ambulatory Visit: Payer: Self-pay

## 2022-02-10 DIAGNOSIS — I4891 Unspecified atrial fibrillation: Secondary | ICD-10-CM

## 2022-02-12 ENCOUNTER — Other Ambulatory Visit: Payer: Self-pay

## 2022-02-12 ENCOUNTER — Other Ambulatory Visit (HOSPITAL_COMMUNITY): Payer: Self-pay

## 2022-02-13 ENCOUNTER — Encounter (HOSPITAL_COMMUNITY): Payer: Self-pay | Admitting: *Deleted

## 2022-02-13 NOTE — Progress Notes (Signed)
Carelink Summary Report / Loop Recorder 

## 2022-02-16 ENCOUNTER — Other Ambulatory Visit: Payer: Self-pay

## 2022-02-17 ENCOUNTER — Other Ambulatory Visit: Payer: Self-pay

## 2022-02-23 DIAGNOSIS — E785 Hyperlipidemia, unspecified: Secondary | ICD-10-CM | POA: Diagnosis not present

## 2022-02-23 DIAGNOSIS — F3342 Major depressive disorder, recurrent, in full remission: Secondary | ICD-10-CM | POA: Diagnosis not present

## 2022-02-23 DIAGNOSIS — E559 Vitamin D deficiency, unspecified: Secondary | ICD-10-CM | POA: Diagnosis not present

## 2022-02-23 DIAGNOSIS — I10 Essential (primary) hypertension: Secondary | ICD-10-CM | POA: Diagnosis not present

## 2022-02-23 DIAGNOSIS — I509 Heart failure, unspecified: Secondary | ICD-10-CM | POA: Diagnosis not present

## 2022-02-23 DIAGNOSIS — E876 Hypokalemia: Secondary | ICD-10-CM | POA: Diagnosis not present

## 2022-02-23 DIAGNOSIS — G620 Drug-induced polyneuropathy: Secondary | ICD-10-CM | POA: Diagnosis not present

## 2022-02-23 DIAGNOSIS — I4891 Unspecified atrial fibrillation: Secondary | ICD-10-CM | POA: Diagnosis not present

## 2022-02-23 DIAGNOSIS — G8929 Other chronic pain: Secondary | ICD-10-CM | POA: Diagnosis not present

## 2022-02-23 DIAGNOSIS — F419 Anxiety disorder, unspecified: Secondary | ICD-10-CM | POA: Diagnosis not present

## 2022-02-23 DIAGNOSIS — I110 Hypertensive heart disease with heart failure: Secondary | ICD-10-CM | POA: Diagnosis not present

## 2022-02-23 DIAGNOSIS — E669 Obesity, unspecified: Secondary | ICD-10-CM | POA: Diagnosis not present

## 2022-02-27 ENCOUNTER — Other Ambulatory Visit (HOSPITAL_COMMUNITY): Payer: Self-pay

## 2022-02-28 ENCOUNTER — Emergency Department (HOSPITAL_BASED_OUTPATIENT_CLINIC_OR_DEPARTMENT_OTHER)
Admission: EM | Admit: 2022-02-28 | Discharge: 2022-03-01 | Disposition: A | Discharge status: Home / Self Care | Payer: PPO | Attending: Emergency Medicine | Admitting: Emergency Medicine

## 2022-02-28 ENCOUNTER — Encounter (HOSPITAL_BASED_OUTPATIENT_CLINIC_OR_DEPARTMENT_OTHER): Payer: Self-pay | Admitting: Emergency Medicine

## 2022-02-28 ENCOUNTER — Other Ambulatory Visit: Payer: Self-pay

## 2022-02-28 DIAGNOSIS — F32A Depression, unspecified: Secondary | ICD-10-CM | POA: Insufficient documentation

## 2022-02-28 DIAGNOSIS — Z8673 Personal history of transient ischemic attack (TIA), and cerebral infarction without residual deficits: Secondary | ICD-10-CM | POA: Diagnosis not present

## 2022-02-28 DIAGNOSIS — I110 Hypertensive heart disease with heart failure: Secondary | ICD-10-CM | POA: Insufficient documentation

## 2022-02-28 DIAGNOSIS — J45909 Unspecified asthma, uncomplicated: Secondary | ICD-10-CM | POA: Diagnosis not present

## 2022-02-28 DIAGNOSIS — Z1152 Encounter for screening for COVID-19: Secondary | ICD-10-CM | POA: Diagnosis not present

## 2022-02-28 DIAGNOSIS — I5032 Chronic diastolic (congestive) heart failure: Secondary | ICD-10-CM | POA: Diagnosis not present

## 2022-02-28 DIAGNOSIS — Z79899 Other long term (current) drug therapy: Secondary | ICD-10-CM | POA: Insufficient documentation

## 2022-02-28 DIAGNOSIS — I4891 Unspecified atrial fibrillation: Secondary | ICD-10-CM | POA: Diagnosis not present

## 2022-02-28 DIAGNOSIS — F419 Anxiety disorder, unspecified: Secondary | ICD-10-CM | POA: Diagnosis not present

## 2022-02-28 LAB — CBC WITH DIFFERENTIAL/PLATELET
Abs Immature Granulocytes: 0.02 10*3/uL (ref 0.00–0.07)
Basophils Absolute: 0.1 10*3/uL (ref 0.0–0.1)
Basophils Relative: 1 %
Eosinophils Absolute: 0.3 10*3/uL (ref 0.0–0.5)
Eosinophils Relative: 3 %
HCT: 40.2 % (ref 36.0–46.0)
Hemoglobin: 13 g/dL (ref 12.0–15.0)
Immature Granulocytes: 0 %
Lymphocytes Relative: 22 %
Lymphs Abs: 2.1 10*3/uL (ref 0.7–4.0)
MCH: 29 pg (ref 26.0–34.0)
MCHC: 32.3 g/dL (ref 30.0–36.0)
MCV: 89.7 fL (ref 80.0–100.0)
Monocytes Absolute: 0.9 10*3/uL (ref 0.1–1.0)
Monocytes Relative: 9 %
Neutro Abs: 6.3 10*3/uL (ref 1.7–7.7)
Neutrophils Relative %: 65 %
Platelets: 309 10*3/uL (ref 150–400)
RBC: 4.48 MIL/uL (ref 3.87–5.11)
RDW: 14.5 % (ref 11.5–15.5)
WBC: 9.6 10*3/uL (ref 4.0–10.5)
nRBC: 0 % (ref 0.0–0.2)

## 2022-02-28 LAB — RESP PANEL BY RT-PCR (RSV, FLU A&B, COVID)  RVPGX2
Influenza A by PCR: NEGATIVE
Influenza B by PCR: NEGATIVE
Resp Syncytial Virus by PCR: NEGATIVE
SARS Coronavirus 2 by RT PCR: NEGATIVE

## 2022-02-28 LAB — BASIC METABOLIC PANEL
Anion gap: 8 (ref 5–15)
BUN: 16 mg/dL (ref 8–23)
CO2: 29 mmol/L (ref 22–32)
Calcium: 9.8 mg/dL (ref 8.9–10.3)
Chloride: 103 mmol/L (ref 98–111)
Creatinine, Ser: 0.88 mg/dL (ref 0.44–1.00)
GFR, Estimated: >60 mL/min (ref >60)
Glucose, Bld: 98 mg/dL (ref 70–99)
Potassium: 4 mmol/L (ref 3.5–5.1)
Sodium: 140 mmol/L (ref 135–145)

## 2022-02-28 LAB — URINALYSIS, ROUTINE W REFLEX MICROSCOPIC
Bilirubin Urine: NEGATIVE
Glucose, UA: NEGATIVE mg/dL
Hgb urine dipstick: NEGATIVE
Ketones, ur: NEGATIVE mg/dL
Leukocytes,Ua: NEGATIVE
Nitrite: NEGATIVE
Protein, ur: NEGATIVE mg/dL
Specific Gravity, Urine: 1.006 (ref 1.005–1.030)
pH: 5.5 (ref 5.0–8.0)

## 2022-02-28 MED ORDER — AMIODARONE HCL 200 MG PO TABS
200.0000 mg | ORAL_TABLET | Freq: Every day | ORAL | Status: DC
  Administered 2022-03-01: 200 mg via ORAL
  Filled 2022-02-28: qty 1

## 2022-02-28 MED ORDER — APIXABAN 2.5 MG PO TABS
5.0000 mg | ORAL_TABLET | Freq: Two times a day (BID) | ORAL | Status: DC
  Administered 2022-03-01: 5 mg via ORAL
  Filled 2022-02-28: qty 2

## 2022-02-28 MED ORDER — AMLODIPINE BESYLATE 5 MG PO TABS
5.0000 mg | ORAL_TABLET | Freq: Every day | ORAL | Status: DC
  Administered 2022-03-01: 5 mg via ORAL
  Filled 2022-02-28: qty 1

## 2022-02-28 MED ORDER — CARVEDILOL 12.5 MG PO TABS
12.5000 mg | ORAL_TABLET | Freq: Two times a day (BID) | ORAL | Status: DC
  Administered 2022-03-01: 12.5 mg via ORAL
  Filled 2022-02-28: qty 1

## 2022-02-28 NOTE — ED Triage Notes (Signed)
Pt arrives to ED with c/o depression and anxiety for past 2-3 months. She denies SI.
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
## 2022-03-01 NOTE — Discharge Instructions (Signed)
Your history, exam, and evaluation today are consistent with depression and anxiety escalating to the point where you needed an evaluation in the emergency department.  We feel you are medically clear for psychiatric management and when psychiatry spoke to they feel you are safe for getting established for outpatient care.  They did not feel he needed admission at this time however, if symptoms were to escalate, change, or worsen rapidly, please return to the nearest emergency department.  Please follow-up as instructed to get established as an outpatient.
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
## 2022-03-03 ENCOUNTER — Ambulatory Visit (HOSPITAL_COMMUNITY)
Admission: EM | Admit: 2022-03-03 | Discharge: 2022-03-03 | Disposition: A | Discharge status: Home / Self Care | Payer: PPO | Attending: Registered Nurse | Admitting: Registered Nurse

## 2022-03-03 ENCOUNTER — Telehealth: Payer: Self-pay | Admitting: Nurse Practitioner

## 2022-03-03 ENCOUNTER — Telehealth: Payer: Self-pay

## 2022-03-03 ENCOUNTER — Encounter: Payer: Self-pay | Admitting: Nurse Practitioner

## 2022-03-03 ENCOUNTER — Telehealth (INDEPENDENT_AMBULATORY_CARE_PROVIDER_SITE_OTHER): Payer: PPO | Admitting: Nurse Practitioner

## 2022-03-03 ENCOUNTER — Encounter (HOSPITAL_COMMUNITY): Payer: Self-pay | Admitting: Registered Nurse

## 2022-03-03 VITALS — Ht 67.0 in | Wt 212.0 lb

## 2022-03-03 DIAGNOSIS — F32A Depression, unspecified: Secondary | ICD-10-CM | POA: Diagnosis not present

## 2022-03-03 DIAGNOSIS — F4323 Adjustment disorder with mixed anxiety and depressed mood: Secondary | ICD-10-CM | POA: Diagnosis not present

## 2022-03-03 DIAGNOSIS — F419 Anxiety disorder, unspecified: Secondary | ICD-10-CM | POA: Diagnosis not present

## 2022-03-03 NOTE — Telephone Encounter (Signed)
Pt was seen in [facility] on 02/28/22  Patient Name: Shannon [name] [facility] Gender: Female DOB: 10-Sep-1950 Age: 72 Y 55 M 17 D Return Phone Number: [phone] (Primary) Address[name]  [address] Client  Healthcare at [place] Client Site Shannon [name] Contact Type Call Who Is Calling Patient / Member / Family / Caregiver Call Type Triage / Clinical Relationship To Patient Self Return Phone Number [phone] (Primary) Chief Complaint SUICIDE - threatening harm to self or others Reason for Call Symptomatic / Request for [place] states her name is Shannon [name] and she is with Shannon [name] location. The pt is having a mental breakdown. She does not want to go to the ER, but there are no available appts until Monday. She is also not sure if the provider will discuss it on Monday. She is trying to become a new pt at [facility], but has seen Dr Karl Ito at [facility]. She is very agitated, depressed, and throwing stuff around. She states she can't handle it anymore- that she is losing it. [facility] Translation No Nurse Assessment Nurse: Shannon Mires, RN, Shannon [name] Date/Time Shannon [name] Time): 02/28/2022 4:20:45 PM Confirm and document reason for call. If symptomatic, describe symptoms. ---Spoke with the caller. She is having a mental breakdown. She had a heart procedure and she was put on several medications and she thinks that is causing. She is very agitated, depressed, and throwing stuff around. She states she can't handle it anymore- that she is losing it. She has had a stroke before but not had any problems before. She just wants to get it under control. Does the patient have any new or worsening symptoms? ---Yes Will a triage be completed? ---Yes Related visit to physician within the last 2 weeks? ---No Does the PT have  any chronic conditions? (i.e. diabetes, asthma, this includes High risk factors for pregnancy, etc.) ---Yes List chronic conditions. ---heart issues, asthma Is this a behavioral health or substance abuse call? ---No Guidelines Guideline Title Affirmed Question Affirmed Notes Nurse Date/Time (Eastern Time) Suicide Concerns [1] Depression symptoms (sadness, hopelessness, decreased energy) AND [2] unable to do any normal activities (e.g., self care, school, work; in comparison to baseline). Shannon Mires, RN, Shannon [name] 02/28/2022 4:25:40 PM Disp. Time Shannon [name] Time) Disposition Final User 02/28/2022 4:14:56 PM Send to Urgent Queue Shannon [name] 02/28/2022 4:31:10 PM Go to ED Now Yes Shannon Mires, RN, Shannon [name] Final Disposition 02/28/2022 4:31:10 PM Go to ED Now Yes Shannon Mires, RN, Willa Rough Disagree/Comply Comply Caller Understands Yes PreDisposition Did not know what to do Care Advice Given Per Guideline GO TO ED NOW: CARE ADVICE given per Suicide Concerns (Adult) guideline. Comments User: Shannon Arrow, RN Date/Time Shannon [name] Time): 02/28/2022 4:36:32 PM discussed at length with the patient and she has agreed to go to the ER. She feels she is having a medication reaction. She says she is really not wanting to harm herself just wants help.with the feelings she is having. Referrals GO TO FACILITY OTHER - SPECIFY
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
## 2022-03-03 NOTE — Telephone Encounter (Signed)
Noted appreciate Kacy's help with the patient

## 2022-03-03 NOTE — Assessment & Plan Note (Addendum)
Discussed with patient therapy and counseling resources. She has the phone number for multiple places that were provided to her at the [facility] this morning. A referral was placed for her this morning and she is to call the [place] if she does not hear from them by 10:00 tomorrow to set up services. We discussed anti-depressant medications, she believes having someone to talk to will help her the most. She is concerned about medications making her feel numb and she wants to feel her emotions. She has a follow up appointment with her PCP on Thursday, encouraged her to keep this appointment. Encouraged patient to contact myself or her PCP if she is unable to set up services as listed above.

## 2022-03-03 NOTE — ED Provider Notes (Signed)
Behavioral Health Urgent Care Medical Screening Exam  Patient Name: Shannon [name] MRN: [id number] Date of Evaluation: 03/03/22 Chief Complaint:   Diagnosis:  Final diagnoses:  None   History of Present illness: Shannon [name] is a 72 y.o. female patient presented to [facility] as a walk in with complaints of worsening depression and anxiety  Shannon [name], 72 y.o., female patient seen face to face by this provider, consulted with Dr. Hampton Abbot; and chart reviewed on 03/03/22.  On evaluation Shannon [name] reports she was seen in emergency room and was sent to [facility] to set up services for therapy.  Reports worsening depression and anxiety related to being overwhelmed at home taking on responsibilities for everyone and not being able to do the things she wants or needs for herself.  Patient denies suicidal/self-harm/homicidal ideation, psychosis, and paranoia.  No prior psychiatric hospitalization or suicide attempt..  Patient stating that she feels therapy can help work through her issues.   During evaluation Shannon [name] is sitting upright in chair with no noted distress.  She is alert/oriented x 4, calm, cooperative, attentive, and responses were relevant and appropriate to assessment questions.  She spoke in a clear tone at moderate volume, and normal pace, with good eye contact.   She denies suicidal/self-harm/homicidal ideation, psychosis, and paranoia.  Objectively:  there is no evidence of psychosis/mania or delusional thinking.  She conversed coherently, with goal directed thoughts, and no distractibility, or pre-occupation.  At this time Shannon [name] is educated and verbalizes understanding of mental health resources and other crisis services in the community. She is instructed to call 911 and present to the nearest emergency room should she experience any suicidal/homicidal ideation, auditory/visual/hallucinations, or detrimental worsening of  her mental health condition.  She was a also advised by Probation officer that she could call the toll-free phone on back of  insurance card to assist with identifying counselors and agencies in network.   Shannon [name] from 02/28/2022 in [facility] Emergency Department at [facility] Admission (Discharged) from 12/13/2021 in [name] from 09/02/2021 in [facility] Emergency Department at [place] No Risk No Risk No Risk       Psychiatric Specialty Exam  Presentation  General Appearance:Appropriate for Environment  Eye Contact:Good  Speech:Clear and Coherent; Normal Rate  Speech Volume:Normal  Handedness:Right   Mood and Affect  Mood: Anxious; Dysphoric  Affect: Congruent   Thought Process  Thought Processes: Coherent; Goal Directed  Descriptions of Associations:Intact  Orientation:Full (Time, Place and Person)  Thought Content:Logical  Diagnosis of Schizophrenia or Schizoaffective disorder in past: No   Hallucinations:None  Ideas of Reference:None  Suicidal Thoughts:No  Homicidal Thoughts:No   Sensorium  Memory: Immediate Good; Recent Good; Remote Good  Judgment: Intact  Insight: Present   Executive Functions  Concentration: Good  Attention Span: Good  Recall: Good  Fund of Knowledge: Good  Language: Good   Psychomotor Activity  Psychomotor Activity: Normal   Assets  Assets: Communication Skills; Desire for Improvement; Financial Resources/Insurance; Housing; Physical Health; Social Support; Resilience; Transportation   Sleep  Sleep: Good  Number of hours: No data recorded  Physical Exam: Physical Exam Vitals and nursing note reviewed.  Constitutional:      General: She is not in acute distress.    Appearance: She is well-developed.  HENT:     Head: Normocephalic and atraumatic.  Eyes:     Conjunctiva/sclera: Conjunctivae normal.  Cardiovascular:     Rate and  Rhythm: Normal rate and regular rhythm.     Heart sounds: No murmur heard. Pulmonary:     Effort: Pulmonary effort is normal. No respiratory distress.     Breath sounds: Normal breath sounds.  Abdominal:     Palpations: Abdomen is soft.     Tenderness: There is no abdominal tenderness.  Musculoskeletal:        General: No swelling.     Cervical back: Neck supple.  Skin:    General: Skin is warm and dry.     Capillary Refill: Capillary refill takes less than 2 seconds.  Neurological:     Mental Status: She is alert.  Psychiatric:        Mood and Affect: Mood normal.    Review of Systems  Psychiatric/Behavioral:  Positive for depression (Stable). Negative for hallucinations and substance abuse. Suicidal ideas: Denies.The patient is nervous/anxious (Stable).   All other systems reviewed and are negative.  Blood pressure 136/67, pulse (!) 56, temperature 98.9 F (37.2 C), temperature source Oral, resp. rate 18, SpO2 99 %. There is no height or weight on file to calculate BMI.  Musculoskeletal: Strength & Muscle Tone: within normal limits Gait & Station: normal Patient leans: N/A   [place] [facility] Discharge Disposition for Follow up and Recommendations: Based on my evaluation the patient does not appear to have an emergency medical condition and can be discharged with resources and follow up care in outpatient services for Medication Management and Individual Therapy   Keinan Brouillet, NP 03/03/2022, 12:25 PM

## 2022-03-03 NOTE — Telephone Encounter (Signed)
LMOM for pt to CB in regards to her mychart VV with Tomasita Morrow, NP today @ 4pm   Was going to see if pt wanted to get her appt done early and to go over meds and a PHQ and GAD 7.

## 2022-03-03 NOTE — Progress Notes (Signed)
   03/03/22 1152  [facility] (Walk-ins at [facility] only)  What Is the Reason for Your Visit/Call Today? 72 year old female presents to [facility] for a follow up appointment for depressive symptoms. Patient was seen at [facility] on Friday 02/28/2022 she was psychiatric cleared and referred to follow-up at [facility] for psychiatric services. Patient denied SI/HI/ and AVH but does state she feels she would be better of dead. Patient states she does not know what's wrong with her and wants help. Stating, "I don't know if it's me or my medication." Patient states she has a varies stressors and need help. Report the past two weeks she has been dealing with a house of sick people including a 64-week old baby. She expressed she lives with her daughter who just had a baby. Reported her daughter, baby, and her husband has been sick. Reported, "I am a 72 years-old and taking care of a baby is a lot." Report she feels that her husband is not helping her as she needs due to him being on oxgyen. She reported it one thing after another. Report the stress is non-stop and she cant's get a break. Report had a stoke January 2023 and since the stoke she cannot deal with things like she used to. Report the stressors started just before Christmas and has gotten progressive worse the past few weeks. Report inability sleep throughout. Report she either awaken during the night by her husband or the dogs.  How Long Has This Been Causing You Problems? 1 wk - 1 month  Have You Recently Had Any Thoughts About Hurting Yourself? No  Are You Planning to Commit Suicide/Harm Yourself At This time? No  Have you Recently Had Thoughts About [place]? No  Are You Planning To Harm Someone At This Time? No  Explanation: N/A  Are you currently experiencing any auditory, visual or other hallucinations? No  Have You Used Any Alcohol or Drugs in the Past 24 Hours? No  What Did You Use and How Much? N/A  Do you have any current medical  co-morbidities that require immediate attention? No  Clinician description of patient physical appearance/behavior: Patient is dressed appropriately for the weather. She cooperative and speaks within normal limits.  What Do You Feel Would Help You the Most Today? Stress Management  If access to [facility] was not available, would you have sought care in the Emergency Department? Yes  Determination of Need Routine (7 days)  Options For Referral Medication Management;Outpatient Therapy

## 2022-03-03 NOTE — Telephone Encounter (Signed)
Received phone call from access nurse.  Pt needed to be seen today.  No appointments available at [facility].  Scheduled for a virtual visit with Enis Gash at 4 pm today.  Pt was seen in ED 02/28/22.    [place] Day - Client TELEPHONE ADVICE RECORD [facility] Patient Name: Shannon [name] Gender: Female DOB: 14-May-1950 Age: 72 Y 54 M 20 D Return Phone Number: [phone] (Primary) Address: [name]/ [name]  [address] Client [facility] Day - Client Client Site [place] - Day Provider Romilda Garret- NP Contact Type Call Who Is Calling Patient / Member / Family / Caregiver Call Type Triage / Clinical Relationship To Patient Self Return Phone Number [phone] (Primary) Chief Complaint Anxiety and Panic Attack Reason for Call Symptomatic / Request for Shannon [name] states that she has depression and anxiety. She was seen for this at the hospital over the weekend. She refused an appointment with her provider for tomorrow. [place] virtual visit 4 pm today Translation No Nurse Assessment Nurse: Zorita Pang, RN, Neoma Laming Date/Time (Eastern Time): 03/03/2022 9:43:20 AM Confirm and document reason for call. If symptomatic, describe symptoms. ---The caller states that she is having anxiety and depression. States that her cup is overflowing. Does the patient have any new or worsening symptoms? ---Yes Will a triage be completed? ---Yes Related visit to physician within the last 2 weeks? ---No Does the PT have any chronic conditions? (i.e. diabetes, asthma, this includes High risk factors for pregnancy, etc.) ---Yes List chronic conditions. ---hypertension, had ablation recently for A fib, statins for stroke. Had stroke 1/23 Is this a behavioral health or substance abuse call? ---Yes Are you having any thoughts or feelings of harming or  killing yourself or someone else? ---No Are you currently experiencing any physical discomfort that you think may be related to the use of alcohol or other drugs? (use substance abuse or alcohol abuse guidelines. These include withdrawal symptoms) ---No Do you worry that you may be hearing or seeing things that others do not? ---No Do you take medications for your condition(s)? ---Yes PLEASE NOTE: All timestamps contained within this report are represented as [place] Standard Time. CONFIDENTIALTY NOTICE: This fax transmission is intended only for the addressee. It contains information that is legally privileged, confidential or otherwise protected from use or disclosure. If you are not the intended recipient, you are strictly prohibited from reviewing, disclosing, copying using or disseminating any of this information or taking any action in reliance on or regarding this information. If you have received this fax in error, please notify us immediately by telephone so that we can arrange for its return to [place]. Phone: [phone], Toll-Free: [phone], Fax: [phone] Page: 2 of 2 Call Id: [id number] Nurse Assessment List medications here. ---Weyman Pedro - not taken in a week. Has worsened. Guidelines Guideline Title Affirmed Question Affirmed Notes Nurse Date/Time (Eastern Time) Anxiety and Panic Attack Patient sounds very upset or troubled to the triager Zorita Pang, RN, Neoma Laming 03/03/2022 9:48:45 AM Disp. Time Eilene Ghazi Time) Disposition Final User 03/03/2022 10:00:28 AM See PCP within 24 Hours Yes Zorita Pang, RN, Neoma Laming Final Disposition 03/03/2022 10:00:28 AM See PCP within 24 Hours Yes Zorita Pang, RN, Garrel Ridgel Disagree/Comply Comply Caller Understands Yes PreDisposition Call Doctor Care Advice Given Per Guideline SEE PCP WITHIN 24 HOURS: * You become worse Comments User: Shannon Buggy, RN Date/Time Eilene Ghazi Time): 03/03/2022 10:01:36 AM The caller was instructed to  call back if  she worsens and she verbalized understanding. Referrals GO TO FACILITY OTHER - SPECIFY

## 2022-03-03 NOTE — Telephone Encounter (Signed)
After reaching out to pt again, noticed that she actually check in to the ED for the reason she is being seen today.   Pt was discharged and advised to follow up with [facility] and [facility]);    Pt may not keep this appt. LMOM to CB just in case.

## 2022-03-03 NOTE — Telephone Encounter (Signed)
Pt did call back and wanted to keep VV with Tomasita Morrow, NP.   Pt was checked in and set up for the appt.

## 2022-03-03 NOTE — Discharge Instructions (Addendum)
  [facility]: Outpatient psychiatric Services  New Patient Assessment and Therapy Walk-in Monday thru Thursday 8:00 am first come first serve until slots are full Every Friday from 1:00 pm to 4:00 pm first come first serve until slots are full  New Patient Psychiatric Medication Management Monday thru Friday from 8:00 am to 11:00 am first come first served until slots are full  For all walk-ins we ask that you arrive by 7:15 am because patients will be seen in there order of arrival.   Availability is limited, and therefore you may not be seen on the same day that you walk in.  Our goal is to serve and meet the needs of our community to the best of our ability.

## 2022-03-04 ENCOUNTER — Telehealth: Payer: Self-pay

## 2022-03-04 NOTE — Transitions of Care (Post Inpatient/ED Visit) (Signed)
   03/04/2022  Name: Shannon [name] MRN: [id number] DOB: 19-Nov-1950  [name] spoke with pt today for [facility] ED follow  up. Pt saw Tomasita Morrow, NP yesterday for a virtual visit and has an appointment with you on Thursday.  She still reports difficulty with anxiety and depression.  She has been given phone numbers to several agencies and is working through her options to find one that takes her insurance.  She tried to get an appointment on Friday and the wait list on the phone was too long and she got frustrated and hung up.  Wanted you to be aware before her visit if she brings it up.      Today's TOC FU Call Status: Today's TOC FU Call Status:: Successful TOC FU Call Competed TOC FU Call Complete Date: 03/04/22  Transition Care Management Follow-up Telephone Call Date of Discharge: 02/28/22 Discharge Facility: [facility]) Type of Discharge: Emergency Department Reason for ED Visit: Mental or Mood Disorder Mental or Mood Disorder Diagnosis: Anxiety How have you been since you were released from the hospital?: Better Any questions or concerns?: Yes Patient Questions/Concerns:: "I still need help" Patient Questions/Concerns Addressed: Notified Provider of Patient Questions/Concerns  Items Reviewed: Did you receive and understand the discharge instructions provided?: Yes Medications obtained and verified?: Yes (Medications Reviewed) Any new allergies since your discharge?: No Dietary orders reviewed?: NA Do you have support at home?: Yes People in Home: spouse Name of Support/Comfort Primary Source: [facility] and Equipment/Supplies: [place] Ordered?: No Any new equipment or medical supplies ordered?: No  Functional Questionnaire: Do you need assistance with bathing/showering or dressing?: No Do you need assistance with meal preparation?: No Do you need assistance with eating?: No Do you have difficulty maintaining continence: No Do you need  assistance with getting out of bed/getting out of a chair/moving?: No Do you have difficulty managing or taking your medications?: No  Folllow up appointments reviewed: PCP Follow-up appointment confirmed?: Yes Date of PCP follow-up appointment?: 03/06/22 Follow-up Provider: Romilda Garret, NP [facility] Follow-up appointment confirmed?: NA Do you need transportation to your follow-up appointment?: No Do you understand care options if your condition(s) worsen?: Yes-patient verbalized understanding    SIGNATURE Ferne Reus, RN

## 2022-03-05 ENCOUNTER — Other Ambulatory Visit (HOSPITAL_COMMUNITY): Payer: Self-pay

## 2022-03-05 NOTE — Addendum Note (Signed)
Addended by: Tomasita Morrow on: 03/05/2022 10:04 AM   Modules accepted: Level of Service

## 2022-03-06 ENCOUNTER — Telehealth: Payer: Self-pay | Admitting: Nurse Practitioner

## 2022-03-06 ENCOUNTER — Encounter: Payer: Self-pay | Admitting: Nurse Practitioner

## 2022-03-06 ENCOUNTER — Ambulatory Visit (INDEPENDENT_AMBULATORY_CARE_PROVIDER_SITE_OTHER): Payer: PPO | Admitting: Nurse Practitioner

## 2022-03-06 ENCOUNTER — Other Ambulatory Visit: Payer: Self-pay

## 2022-03-06 ENCOUNTER — Other Ambulatory Visit (HOSPITAL_COMMUNITY): Payer: Self-pay

## 2022-03-06 VITALS — BP 132/62 | HR 58 | Temp 97.3°F | Resp 16 | Ht 67.0 in | Wt 215.1 lb

## 2022-03-06 DIAGNOSIS — Z20822 Contact with and (suspected) exposure to covid-19: Secondary | ICD-10-CM | POA: Insufficient documentation

## 2022-03-06 DIAGNOSIS — F322 Major depressive disorder, single episode, severe without psychotic features: Secondary | ICD-10-CM

## 2022-03-06 DIAGNOSIS — F411 Generalized anxiety disorder: Secondary | ICD-10-CM | POA: Diagnosis not present

## 2022-03-06 DIAGNOSIS — U071 COVID-19: Secondary | ICD-10-CM

## 2022-03-06 DIAGNOSIS — Z638 Other specified problems related to primary support group: Secondary | ICD-10-CM | POA: Diagnosis not present

## 2022-03-06 DIAGNOSIS — R519 Headache, unspecified: Secondary | ICD-10-CM | POA: Diagnosis not present

## 2022-03-06 HISTORY — DX: COVID-19: U07.1

## 2022-03-06 HISTORY — DX: Headache, unspecified: R51.9

## 2022-03-06 LAB — POC COVID19 BINAXNOW: SARS Coronavirus 2 Ag: POSITIVE — AB

## 2022-03-06 MED ORDER — MOLNUPIRAVIR EUA 200MG CAPSULE
4.0000 | ORAL_CAPSULE | Freq: Two times a day (BID) | ORAL | 0 refills | Status: AC
  Filled 2022-03-06: qty 40, 5d supply, fill #0

## 2022-03-06 NOTE — Patient Instructions (Addendum)
Nice to see you today I will reach out to Dr. Quentin Ore and we will figure out a medication plan Follow up with me in 6 weeks, sooner if you need me  Quarantine through 03/09/2022. If you are fever free for 24 hours with out the use of fever reducing medications then on 03/10/2022 you can come out of quarantine but need to wear a mask around others through 03/14/2022

## 2022-03-06 NOTE — Assessment & Plan Note (Signed)
PHQ-9 and GAD-7 administered in office.  Patient has no HI/SI/AVH.  She does have therapy set up this coming Thursday a week from today.  Patient used to be on duloxetine but had to come off per her EP prior to having cardiac ablation.  Patient has tried fluoxetine, sertraline, duloxetine.

## 2022-03-06 NOTE — Assessment & Plan Note (Signed)
Patient is having to look after her husband with chronic medical illnesses and her grandchild that she watches 5 days a week from 9 AM to 5 PM.

## 2022-03-06 NOTE — Assessment & Plan Note (Signed)
Patient was informed that her daughter tested positive for COVID-19.  They do all live in the same house and patient watches her daughter's child COVID test in office.

## 2022-03-06 NOTE — Assessment & Plan Note (Signed)
COVID test positive.  Did discuss CDC guidelines/recommendations in regards to self-isolation and quarantine.  Did discuss signs and symptoms when to be seen urgent or emergently.  Did discuss antiviral treatments that they are EUA only.  Patient only qualifies for molnupiravir.  Prescription sent to pharmacy

## 2022-03-06 NOTE — Assessment & Plan Note (Signed)
COVID test in office.  Patient rest drink plenty of fluids over-the-counter analgesics as needed.  Patient can only take Tylenol

## 2022-03-06 NOTE — Telephone Encounter (Signed)
Dr. Quentin Ore,  I saw [facility] in office and thinks she would benefit from going back no anxiety and depression medication. She did inform me that you wanted her off the duloxetine prior to the ablation. I saw that her last QTc was 451. I was planning on placing her on a low dose venlafaxine but wanted to clear it through you first.  Thanks, Matt C

## 2022-03-06 NOTE — Progress Notes (Signed)
Established Patient Office Visit  Subjective   Patient ID: Shannon [name], female    DOB: 10-08-1950  Age: 72 y.o. MRN: [id number]  Chief Complaint  Patient presents with   Mental Health Problem    HPI  MDD/GAD: states that she has had a lot of stress since thanksgiving. States that she had an ablation on 12/13/2021. States that over the past 2 weeks everyone has been sick and that seems to have broken the back States that she is having to watch the grand baby every day through the week 9-5. States that her husband has a lot of issues and has alrams that go off. States that her husband is sick and is getting on her neveres.  She was seen at [facility] and they gave her resources. She was also evlauted virtually by a colleague Tomasita Morrow, NP  Patient has been on antidepressants in the past inclusive of duloxetine 30 mg and 60 mg.  Fluoxetine 20 mg and sertraline 100 mg.  Patient stated that the duloxetine just blunted her she did not care about anything.  States she was asked to come off this medication by her electrophysiologist Dr. Lars Mage prior to undergoing an ablation in December.  States that since she has been put on the amiodarone she has been having sensitity to light. Some sagging of the lower arms  Covid: Patient at the end of the visit states that her daughter test positive for covid. She stays with the patient and the patient watches the daughters child. Upon further questioning patient was having some symptoms that started approx 3 days ago Covid vaccine: moderna x2 with booster Flu vaccine: Not up-to-date     03/06/2022   11:26 AM 03/03/2022    1:27 PM 11/22/2021    3:01 PM  PHQ9 SCORE ONLY  PHQ-9 Total Score '16 19 5       '$ 03/06/2022   11:26 AM 03/03/2022    1:31 PM 11/22/2021    3:01 PM 08/22/2021    3:26 PM  GAD 7 : Generalized Anxiety Score  Nervous, Anxious, on Edge '3 3 1 '$ 0  Control/stop worrying '3 3 1 '$ 0  Worry too much - different things '3 3 1 '$ 0   Trouble relaxing '2 3 1 '$ 0  Restless 0 1 0 0  Easily annoyed or irritable '3 3 1 '$ 0  Afraid - awful might happen 1 3 0 0  Total GAD 7 Score '15 19 5 '$ 0  Anxiety Difficulty Somewhat difficult Very difficult Somewhat difficult Not difficult at all        Review of Systems  Constitutional:  Positive for malaise/fatigue. Negative for chills and fever.  HENT:  Negative for ear discharge, ear pain and sore throat.        "+" Sneezing  Respiratory:  Negative for shortness of breath.   Cardiovascular:  Negative for chest pain.  Neurological:  Positive for headaches.  Psychiatric/Behavioral:  Negative for hallucinations and suicidal ideas.       Objective:     BP 132/62   Pulse (!) 58   Temp (!) 97.3 F (36.3 C)   Resp 16   Ht '5\' 7"'$  (1.702 m)   Wt 215 lb 2 oz (97.6 kg)   SpO2 97%   BMI 33.69 kg/m  BP Readings from Last 3 Encounters:  03/06/22 132/62  03/03/22 136/67  03/01/22 (!) 158/71   Wt Readings from Last 3 Encounters:  03/06/22 215 lb 2 oz (97.6 kg)  03/03/22  212 lb (96.2 kg)  02/28/22 212 lb (96.2 kg)      Physical Exam Vitals and nursing note reviewed.  Constitutional:      Appearance: Normal appearance.  Cardiovascular:     Rate and Rhythm: Normal rate and regular rhythm.     Heart sounds: Normal heart sounds.  Pulmonary:     Effort: Pulmonary effort is normal.     Breath sounds: Normal breath sounds.  Neurological:     Mental Status: She is alert.      Results for orders placed or performed in visit on 03/06/22  POC COVID-19 BinaxNow  Result Value Ref Range   SARS Coronavirus 2 Ag Positive (A) Negative      The ASCVD Risk score (Arnett DK, et al., 2019) failed to calculate for the following reasons:   The patient has a prior MI or stroke diagnosis    Assessment & Plan:   Problem List Items Addressed This Visit       Other   Current severe episode of major depressive disorder without psychotic features without prior episode ([place]) - Primary     PHQ-9 and GAD-7 in office.  Patient does have thoughts of being better off not waking up or being on earth.  No active SI HI/AVH.  Patient did contract for safety.  We did discuss resources and emergent situations.  Patient has been on antidepressants in the past inclusive of duloxetine, fluoxetine, sertraline.  Will reach out to EP prior to starting patient back on antidepressant      GAD (generalized anxiety disorder)    PHQ-9 and GAD-7 administered in office.  Patient has no HI/SI/AVH.  She does have therapy set up this coming Thursday a week from today.  Patient used to be on duloxetine but had to come off per her EP prior to having cardiac ablation.  Patient has tried fluoxetine, sertraline, duloxetine.      Caregiver role strain    Patient is having to look after her husband with chronic medical illnesses and her grandchild that she watches 5 days a week from 9 AM to 5 PM.      Headache, unspecified headache type    COVID test in office.  Patient rest drink plenty of fluids over-the-counter analgesics as needed.  Patient can only take Tylenol      Relevant Orders   POC COVID-19 BinaxNow (Completed)   Exposure to COVID-19 virus    Patient was informed that her daughter tested positive for COVID-19.  They do all live in the same house and patient watches her daughter's child COVID test in office.      COVID-19    COVID test positive.  Did discuss CDC guidelines/recommendations in regards to self-isolation and quarantine.  Did discuss signs and symptoms when to be seen urgent or emergently.  Did discuss antiviral treatments that they are EUA only.  Patient only qualifies for molnupiravir.  Prescription sent to pharmacy      Relevant Medications   molnupiravir EUA (LAGEVRIO) 200 mg CAPS capsule    Return in about 6 weeks (around 04/17/2022) for MDD/GAD recheck .    Romilda Garret, NP

## 2022-03-06 NOTE — Assessment & Plan Note (Signed)
PHQ-9 and GAD-7 in office.  Patient does have thoughts of being better off not waking up or being on earth.  No active SI HI/AVH.  Patient did contract for safety.  We did discuss resources and emergent situations.  Patient has been on antidepressants in the past inclusive of duloxetine, fluoxetine, sertraline.  Will reach out to EP prior to starting patient back on antidepressant

## 2022-03-08 ENCOUNTER — Other Ambulatory Visit: Payer: Self-pay

## 2022-03-08 ENCOUNTER — Emergency Department (HOSPITAL_BASED_OUTPATIENT_CLINIC_OR_DEPARTMENT_OTHER): Payer: PPO

## 2022-03-08 ENCOUNTER — Emergency Department (HOSPITAL_BASED_OUTPATIENT_CLINIC_OR_DEPARTMENT_OTHER)
Admission: EM | Admit: 2022-03-08 | Discharge: 2022-03-08 | Disposition: A | Discharge status: Home / Self Care | Payer: PPO | Attending: Emergency Medicine | Admitting: Emergency Medicine

## 2022-03-08 ENCOUNTER — Other Ambulatory Visit (HOSPITAL_COMMUNITY): Payer: Self-pay

## 2022-03-08 ENCOUNTER — Encounter (HOSPITAL_BASED_OUTPATIENT_CLINIC_OR_DEPARTMENT_OTHER): Payer: Self-pay | Admitting: Emergency Medicine

## 2022-03-08 DIAGNOSIS — I10 Essential (primary) hypertension: Secondary | ICD-10-CM | POA: Insufficient documentation

## 2022-03-08 DIAGNOSIS — I2510 Atherosclerotic heart disease of native coronary artery without angina pectoris: Secondary | ICD-10-CM | POA: Diagnosis not present

## 2022-03-08 DIAGNOSIS — I4891 Unspecified atrial fibrillation: Secondary | ICD-10-CM | POA: Diagnosis not present

## 2022-03-08 DIAGNOSIS — Z7901 Long term (current) use of anticoagulants: Secondary | ICD-10-CM | POA: Diagnosis not present

## 2022-03-08 DIAGNOSIS — T887XXA Unspecified adverse effect of drug or medicament, initial encounter: Secondary | ICD-10-CM | POA: Insufficient documentation

## 2022-03-08 DIAGNOSIS — Z79899 Other long term (current) drug therapy: Secondary | ICD-10-CM | POA: Insufficient documentation

## 2022-03-08 DIAGNOSIS — T50905A Adverse effect of unspecified drugs, medicaments and biological substances, initial encounter: Secondary | ICD-10-CM

## 2022-03-08 DIAGNOSIS — R0602 Shortness of breath: Secondary | ICD-10-CM | POA: Diagnosis not present

## 2022-03-08 DIAGNOSIS — U071 COVID-19: Secondary | ICD-10-CM | POA: Insufficient documentation

## 2022-03-08 DIAGNOSIS — R6889 Other general symptoms and signs: Secondary | ICD-10-CM | POA: Diagnosis not present

## 2022-03-08 DIAGNOSIS — R531 Weakness: Secondary | ICD-10-CM | POA: Diagnosis not present

## 2022-03-08 DIAGNOSIS — Z8616 Personal history of COVID-19: Secondary | ICD-10-CM | POA: Insufficient documentation

## 2022-03-08 DIAGNOSIS — E860 Dehydration: Secondary | ICD-10-CM | POA: Diagnosis not present

## 2022-03-08 DIAGNOSIS — R404 Transient alteration of awareness: Secondary | ICD-10-CM | POA: Diagnosis not present

## 2022-03-08 DIAGNOSIS — J45909 Unspecified asthma, uncomplicated: Secondary | ICD-10-CM | POA: Insufficient documentation

## 2022-03-08 DIAGNOSIS — Z743 Need for continuous supervision: Secondary | ICD-10-CM | POA: Diagnosis not present

## 2022-03-08 DIAGNOSIS — I499 Cardiac arrhythmia, unspecified: Secondary | ICD-10-CM | POA: Diagnosis not present

## 2022-03-08 LAB — BASIC METABOLIC PANEL
Anion gap: 8 (ref 5–15)
BUN: 12 mg/dL (ref 8–23)
CO2: 30 mmol/L (ref 22–32)
Calcium: 8.8 mg/dL — ABNORMAL LOW (ref 8.9–10.3)
Chloride: 101 mmol/L (ref 98–111)
Creatinine, Ser: 0.8 mg/dL (ref 0.44–1.00)
GFR, Estimated: >60 mL/min (ref >60)
Glucose, Bld: 113 mg/dL — ABNORMAL HIGH (ref 70–99)
Potassium: 3.4 mmol/L — ABNORMAL LOW (ref 3.5–5.1)
Sodium: 139 mmol/L (ref 135–145)

## 2022-03-08 LAB — CBC
HCT: 39.4 % (ref 36.0–46.0)
Hemoglobin: 12.8 g/dL (ref 12.0–15.0)
MCH: 29.5 pg (ref 26.0–34.0)
MCHC: 32.5 g/dL (ref 30.0–36.0)
MCV: 90.8 fL (ref 80.0–100.0)
Platelets: 225 10*3/uL (ref 150–400)
RBC: 4.34 MIL/uL (ref 3.87–5.11)
RDW: 14.6 % (ref 11.5–15.5)
WBC: 8.3 10*3/uL (ref 4.0–10.5)
nRBC: 0 % (ref 0.0–0.2)

## 2022-03-08 MED ORDER — CARVEDILOL 12.5 MG PO TABS
12.5000 mg | ORAL_TABLET | Freq: Once | ORAL | Status: AC
  Administered 2022-03-08: 12.5 mg via ORAL
  Filled 2022-03-08: qty 1

## 2022-03-08 MED ORDER — APIXABAN 2.5 MG PO TABS
5.0000 mg | ORAL_TABLET | Freq: Once | ORAL | Status: AC
  Administered 2022-03-08: 5 mg via ORAL
  Filled 2022-03-08: qty 2

## 2022-03-08 MED ORDER — POTASSIUM CHLORIDE CRYS ER 20 MEQ PO TBCR
40.0000 meq | EXTENDED_RELEASE_TABLET | Freq: Once | ORAL | Status: AC
  Administered 2022-03-08: 40 meq via ORAL
  Filled 2022-03-08: qty 2

## 2022-03-08 MED ORDER — METOPROLOL TARTRATE 5 MG/5ML IV SOLN
5.0000 mg | Freq: Once | INTRAVENOUS | Status: DC
  Filled 2022-03-08: qty 5

## 2022-03-08 MED ORDER — SODIUM CHLORIDE 0.9 % IV BOLUS
1000.0000 mL | Freq: Once | INTRAVENOUS | Status: AC
  Administered 2022-03-08: 1000 mL via INTRAVENOUS

## 2022-03-08 NOTE — ED Provider Notes (Signed)
[facility] Provider Note   CSN: [id number] Arrival date & time: 03/08/22  1027     History  Chief Complaint  Patient presents with   Weakness    Shannon [name] is a 72 y.o. female.  With a history of atrial fibrillation status post 3 ablations most recently in December 2023 and currently anticoagulated on Eliquis, chronic fatigue, anxiety, depression, hypertension, OSA, asthma, CAD who presents to the ED for evaluation of medication reaction.  She states she began to feel upper respiratory infection symptoms on Wednesday.  Her daughter tested positive for COVID at that time.  She presented to her primary care provider on Thursday and was diagnosed with COVID.  She was started on an antiviral that she does not know the name of but states that it is a new medication.  She states that each time she takes the medication, she feels significantly worse 3 hours later.  She states she feels nausea, weakness and diaphoresis every time she takes it.  Today when she checked her heart rate while feeling the symptoms she noticed it was approximately 120 bpm.  This caused her to go the ambulance.  Per EMS, orthostatic vitals were significant for systolic blood pressure in the 80s.  Patient does however history of orthostatic hypotension.  She denies all symptoms at this time including chest pain, shortness of breath, nausea, vomiting.  She does have a nonproductive cough which has been present since she was diagnosed with COVID.   Weakness      Home Medications Prior to Admission medications   Medication Sig Start Date End Date Taking? Authorizing Provider  acetaminophen (TYLENOL) 500 MG tablet Take 1,000 mg by mouth every 6 (six) hours as needed for moderate pain or headache.    [provider]  albuterol (VENTOLIN HFA) 108 (90 Base) MCG/ACT inhaler Inhale 1-2 puffs into the lungs every 6 (six) hours as needed for wheezing or shortness of  breath. 12/23/19   Parrett, Fonnie Mu, NP  amiodarone (PACERONE) 200 MG tablet Take 1 tablet (200 mg total) by mouth daily. 02/04/22   Sherran Needs, NP  amLODipine (NORVASC) 5 MG tablet Take 1 tablet (5 mg total) by mouth daily. 02/04/22   Sherran Needs, NP  apixaban (ELIQUIS) 5 MG TABS tablet Take 1 tablet (5 mg total) by mouth 2 (two) times daily. 01/09/22   Michela Pitcher, NP  carvedilol (COREG) 12.5 MG tablet Take 1 tablet (12.5 mg total) by mouth 2 (two) times daily. 12/16/21   Shirley Friar, PA-C  Cholecalciferol (VITAMIN D) 50 MCG (2000 UT) CAPS Take 2,000 Units by mouth in the morning.    [provider]  furosemide (LASIX) 20 MG tablet Take 1 tablet (20 mg total) by mouth daily. 01/20/22   Sherran Needs, NP  Hydrocortisone (0000000 EX) Apply 1 application  topically as needed (skin irritation/itching).    [provider]  loratadine (CLARITIN) 10 MG tablet Take 10 mg by mouth daily as needed for allergies.    [provider]  LORazepam (ATIVAN) 0.5 MG tablet Take 1 tablet (0.5 mg total) by mouth every 8 (eight) hours as needed for anxiety. 01/09/22   Michela Pitcher, NP  meclizine (ANTIVERT) 12.5 MG tablet Take 1 tablet (12.5 mg total) by mouth 3 (three) times daily as needed for dizziness. 09/02/21   Jeanell Sparrow, DO  molnupiravir EUA (LAGEVRIO) 200 mg CAPS capsule Take 4 capsules (800 mg total)  by mouth 2 (two) times daily for 5 days. 03/06/22 03/11/22  Michela Pitcher, NP  Na Sulfate-K Sulfate-Mg Sulf (SUPREP BOWEL PREP KIT) 17.5-3.13-1.6 GM/177ML SOLN Take 1 kit by mouth as directed. For colonoscopy prep 01/31/21   Willia Craze, NP  potassium chloride (KLOR-CON) 10 MEQ tablet Take 1 tablet (10 mEq total) by mouth daily. 02/04/22   Sherran Needs, NP  simvastatin (ZOCOR) 20 MG tablet Take 1 tablet (20 mg total) by mouth daily. 01/09/22   Michela Pitcher, NP  triamcinolone (NASACORT) 55 MCG/ACT AERO nasal inhaler Place 1 spray into the nose daily as  needed (allergies).    [provider]      Allergies    Ace inhibitors, Elemental sulfur, Hctz [hydrochlorothiazide], Oxycodone, Prednisone, Sulfa antibiotics, and Voltaren [diclofenac sodium]    Review of Systems   Review of Systems  Neurological:  Positive for weakness.  All other systems reviewed and are negative.   Physical Exam Updated Vital Signs BP 123/70   Pulse 66   Temp 98.1 F (36.7 C) (Oral)   Resp (!) 24   SpO2 97%  Physical Exam Vitals and nursing note reviewed.  Constitutional:      General: She is not in acute distress.    Appearance: She is well-developed.     Comments: Resting comfortably in bed  HENT:     Head: Normocephalic and atraumatic.     Nose: Congestion and rhinorrhea present.  Eyes:     Conjunctiva/sclera: Conjunctivae normal.  Cardiovascular:     Rate and Rhythm: Tachycardia present. Rhythm irregular.     Heart sounds: No murmur heard. Pulmonary:     Effort: Pulmonary effort is normal. No respiratory distress.     Breath sounds: No stridor. No wheezing, rhonchi or rales.     Comments: Distant breath sounds Abdominal:     Palpations: Abdomen is soft.     Tenderness: There is no abdominal tenderness. There is no guarding.  Musculoskeletal:        General: No swelling.     Cervical back: Neck supple. No rigidity or tenderness.     Right lower leg: No edema.     Left lower leg: No edema.  Skin:    General: Skin is warm and dry.     Capillary Refill: Capillary refill takes less than 2 seconds.  Neurological:     General: No focal deficit present.     Mental Status: She is alert and oriented to person, place, and time.  Psychiatric:        Mood and Affect: Mood normal.     ED Results / Procedures / Treatments   Labs (all labs ordered are listed, but only abnormal results are displayed) Labs Reviewed  BASIC METABOLIC PANEL - Abnormal; Notable for the following components:      Result Value   Potassium 3.4 (*)    Glucose,  Bld 113 (*)    Calcium 8.8 (*)    All other components within normal limits  CBC    EKG EKG Interpretation  Date/Time:  Saturday March 08 2022 13:11:27 EST Ventricular Rate:  62 PR Interval:  154 QRS Duration: 96 QT Interval:  512 QTC Calculation: 520 R Axis:   70 Text Interpretation: Sinus or ectopic atrial rhythm Borderline repolarization abnormality Prolonged QT interval Confirmed by Regan Lemming ([id number]) on 03/08/2022 1:41:30 PM  Radiology DG Chest Port 1 View  Result Date: 03/08/2022 CLINICAL DATA:  Shortness of breath. History of atrial fibrillation.  EXAM: PORTABLE CHEST 1 VIEW COMPARISON:  Chest x-ray January 20, 2022 FINDINGS: A loop recorder device projects over the heart. Stable mild cardiomegaly. The hila and mediastinum are normal. No pneumothorax. No nodules or masses. No focal infiltrates. No overt edema. No cause for symptoms identified on today's study. IMPRESSION: No active disease. Electronically Signed   By: Dorise Bullion III M.D.   On: 03/08/2022 11:30    Procedures Procedures    Medications Ordered in ED Medications  sodium chloride 0.9 % bolus 1,000 mL (0 mLs Intravenous Stopped 03/08/22 1232)  apixaban (ELIQUIS) tablet 5 mg (5 mg Oral Given 03/08/22 1228)  carvedilol (COREG) tablet 12.5 mg (12.5 mg Oral Given 03/08/22 1302)  potassium chloride SA (KLOR-CON M) CR tablet 40 mEq (40 mEq Oral Given 03/08/22 1347)    ED Course/ Medical Decision Making/ A&P Clinical Course as of 03/08/22 1411  Sat Mar 08, 2022  1236 Patient went to the bathroom.  When she returned, she was found to have converted to normal sinus rhythm with a rate of 65 bpm.  Home dose of carvedilol will be given and patient will be monitored for 1 more hour. [AS]    Clinical Course User Index [AS] Kaleisha Bhargava, [place] Folk, PA-C                             Medical Decision Making Amount and/or Complexity of Data Reviewed Labs: ordered. Radiology: ordered.  Risk Prescription drug  management.  This patient presents to the ED for concern of weakness, medication reaction, this involves an extensive number of treatment options, and is a complaint that carries with it a high risk of complications and morbidity.  The differential diagnosis includes medication reaction to antiviral, COVID  Co morbidities that complicate the patient evaluation  atrial fibrillation status post 3 ablations most recently in December 2023 and currently anticoagulated on Eliquis, chronic fatigue, anxiety, depression, hypertension, OSA, asthma, CAD, orthostatic hypotension  My initial workup includes basic labs, chest x-ray, fluid resuscitation, trend heart rate  Additional history obtained from: Nursing notes from this visit. EMS provides a portion of the history  I ordered, reviewed and interpreted labs which include: BMP, CBC  I ordered imaging studies including chest x-ray I independently visualized and interpreted imaging which showed normal I agree with the radiologist interpretation  Cardiac Monitoring:  The patient was maintained on a cardiac monitor.  I personally viewed and interpreted the cardiac monitored which showed an underlying rhythm of: Initially A-fib with RVR with heart rate between 101 110 bpm.  Spontaneously converted to a normal sinus rhythm with a rate of 65 bpm.  Afebrile, initially presented in A-fib with RVR which spontaneously cardioverted to normal sinus rhythm with a rate of 5 bpm.  72 year old female presenting to the ED for evaluation of palpitations, nausea, diaphoresis and weakness.  The symptoms occur approximately 3 hours after she takes her antiviral for COVID that was diagnosed 3 days ago.  Patient was treated with IV fluids and labs were obtained.  She reported significant improvement in her symptoms after spontaneous cardioversion.  She was given her home dose of Coreg and monitored for 1 hour and found to be in normal sinus rhythm for the entire duration.   She was requesting discharge at that time.  I believe this is appropriate.  She was encouraged to stop taking the antiviral but continue her other home medications.  She was given return precautions.  She  was encouraged to follow-up with her primary care provider in 1 week for reevaluation.  Stable at time of discharge.  At this time there does not appear to be any evidence of an acute emergency medical condition and the patient appears stable for discharge with appropriate outpatient follow up. Diagnosis was discussed with patient who verbalizes understanding of care plan and is agreeable to discharge. I have discussed return precautions with patient who verbalizes understanding. Patient encouraged to follow-up with their PCP within 1 week. All questions answered.  Patient's case discussed with Dr. Armandina Gemma who agrees with plan to discharge with follow-up.   Note: Portions of this report may have been transcribed using voice recognition software. Every effort was made to ensure accuracy; however, inadvertent computerized transcription errors may still be present.        Final Clinical Impression(s) / ED Diagnoses Final diagnoses:  Adverse effect of drug, initial encounter  Atrial fibrillation with rapid ventricular response [facility])  COVID-19    Rx / DC Orders ED Discharge Orders     None         Roylene Reason, Hershal Coria 03/08/22 1411    Regan Lemming, MD 03/09/22 [phone]

## 2022-03-08 NOTE — ED Triage Notes (Signed)
Pt from home via [facility] with reports of weakness. Pt reports she was dx with COVID on Wednesday. Pt stating she started taking and antiviral and reports diaphoresis and palpitations. Pt was orthostatic today at 70 systolic when standing. Pt received 62m NaCl en route via EMS.

## 2022-03-08 NOTE — Discharge Instructions (Signed)
You have been seen today for your complaint of her legs, focal medication reaction to antiviral. Your lab work was reassuring. Your imaging was reassuring. Your discharge medications include your home medications. Follow up with: Your primary care provider in 1 week for reevaluation Please seek immediate medical care if you develop any of the following symptoms: You have chest pain. You have trouble breathing. You have side effects of blood thinners, such as blood in your vomit, poop (stool), or pee (urine), or bleeding that does not stop. You have any symptoms of a stroke. "BE FAST" is an easy way to remember the main warning signs of a stroke: B - Balance. Signs are dizziness, sudden trouble walking, or loss of balance. E - Eyes. Signs are trouble seeing or a sudden change in vision. F - Face. Signs are sudden weakness or numbness of the face, or the face or eyelid drooping on one side. A - Arms. Signs are weakness or numbness in an arm. This happens suddenly and usually on one side of the body. S - Speech.Signs are sudden trouble speaking, slurred speech, or trouble understanding what people say. T - Time. Time to call emergency services. Write down what time symptoms started. Other signs of a stroke, such as: A sudden, severe headache with no known cause. Nausea or vomiting. Seizure. At this time there does not appear to be the presence of an emergent medical condition, however there is always the potential for conditions to change. Please read and follow the below instructions.  Do not take your medicine if  develop an itchy rash, swelling in your mouth or lips, or difficulty breathing; call 911 and seek immediate emergency medical attention if this occurs.  You may review your lab tests and imaging results in their entirety on your MyChart account.  Please discuss all results of fully with your primary care provider and other specialist at your follow-up visit.  Note: Portions of this  text may have been transcribed using voice recognition software. Every effort was made to ensure accuracy; however, inadvertent computerized transcription errors may still be present.

## 2022-03-10 ENCOUNTER — Telehealth: Payer: Self-pay

## 2022-03-10 NOTE — Telephone Encounter (Signed)
Can we let Shannon [name] now that Dr. Quentin Ore is ok for her to try effexor to help with mood/stress/anxiety. Can we make sure she is good to start this medication?  Also check on her since her dx with covid and I saw that she went to the ED over the weekend

## 2022-03-10 NOTE — Telephone Encounter (Signed)
noted 

## 2022-03-10 NOTE — Telephone Encounter (Signed)
Per chart review tab pt was seen at [facility] ED on 03/08/22. Sending note to Romilda Garret NP and [place] pool.   [facility] Night - Client TELEPHONE ADVICE RECORD AccessNurse Patient Name: Shannon [name] [facility] Gender: Female DOB: [birth year] Age: 72 Y 76 M 25 D Return Phone Number: [phone] (Primary) Address[place]  [address] Client [facility] Night - Client Client Site [place] Provider Romilda Garret- NP Contact Type Call Who Is Calling Patient / Member / Family / Caregiver Call Type Triage / Clinical Relationship To Patient Self Return Phone Number [phone] (Primary) Chief Complaint Heart palpitations or irregular heartbeat Reason for Call Symptomatic / Request for [place] states she was prescribed Lagevrio this past week for being covid positive. The medicine is making her have a fast heart rate, 113, and her blood pressure is lower then normal, 108/76. She is A Fib and she had an ablation 12/13/2021. Translation No Nurse Assessment Nurse: Quentin Ore, RN, April Date/Time Eilene Ghazi Time): 03/08/2022 8:05:38 AM Confirm and document reason for call. If symptomatic, describe symptoms. ---Caller states she was prescribed Lagevrio on Thursday for being covid positive. and is about half way finished. The medicine is making her have a fast heart rate, 113, and her blood pressure is lower then normal, 108/76. She is A Fib and she had an ablation 12/13/2021. Covid sx headache has become minor, drainage is making her cough but overall was feeling better until heart started racing. Does the patient have any new or worsening symptoms? ---Yes Will a triage be completed? ---Yes Related visit to physician within the last 2 weeks? ---Yes Does the PT have any chronic conditions? (i.e. diabetes, asthma, this includes High risk factors  for pregnancy, etc.) ---Yes List chronic conditions. ---afib, stroke 1 yr ago, htn Is this a behavioral health or substance abuse call? ---No Guidelines Guideline Title Affirmed Question Affirmed Notes Nurse Date/Time (Eastern Time) Heart Rate and Heartbeat Questions Shock suspected (e.g., cold/pale/ clammy skin, too Quentin Ore, RN, April 03/08/2022 8:09:23 AM PLEASE NOTE: All timestamps contained within this report are represented as [place] Standard Time. CONFIDENTIALTY NOTICE: This fax transmission is intended only for the addressee. It contains information that is legally privileged, confidential or otherwise protected from use or disclosure. If you are not the intended recipient, you are strictly prohibited from reviewing, disclosing, copying using or disseminating any of this information or taking any action in reliance on or regarding this information. If you have received this fax in error, please notify us immediately by telephone so that we can arrange for its return to [place]. Phone: [phone], Toll-Free: [phone], Fax: [phone] Page: 2 of 2 Call Id: [id number] Guidelines Guideline Title Affirmed Question Affirmed Notes Nurse Date/Time Eilene Ghazi Time) weak to stand, low BP, rapid pulse) Disp. Time Eilene Ghazi Time) Disposition Final User 03/08/2022 8:11:10 AM Call EMS 911 Now Yes Quentin Ore, RN, April 03/08/2022 8:17:52 AM 911 Outcome Documentation Quentin Ore, RN, April Reason: Caller stated that EMS is on their way. Final Disposition 03/08/2022 8:11:10 AM Call EMS 911 Now Yes Quentin Ore, RN, April Caller Disagree/Comply Comply Caller Understands Yes PreDisposition InappropriateToAsk Care Advice Given Per Guideline CALL EMS 911 NOW: * Immediate medical attention is needed. You need to hang up and call 911 (or an ambulance). * Triager Discretion: I'll call you back in a few minutes to be sure you were able to reach them. CARE ADVICE given per Heart Rate and  Heartbeat Questions (Adult)  guideline

## 2022-03-10 NOTE — Transitions of Care (Post Inpatient/ED Visit) (Signed)
   03/10/2022  Name: Shannon [name] MRN: [id number] DOB: March 07, 1950  Today's TOC FU Call Status: Today's TOC FU Call Status:: Successful TOC FU Call Competed TOC FU Call Complete Date: 03/10/22  Transition Care Management Follow-up Telephone Call Date of Discharge: 03/08/22 Discharge Facility: [facility] ([facility]) Type of Discharge: Emergency Department Reason for ED Visit: Other: (Adverse effect of drug) How have you been since you were released from the hospital?: Better Any questions or concerns?: No  Items Reviewed: Did you receive and understand the discharge instructions provided?: Yes Medications obtained and verified?: Yes (Medications Reviewed) Any new allergies since your discharge?: No Dietary orders reviewed?: No Do you have support at home?: Yes  Home Care and Equipment/Supplies: [place] Ordered?: No Any new equipment or medical supplies ordered?: No  Functional Questionnaire: Do you need assistance with bathing/showering or dressing?: No Do you need assistance with meal preparation?: No Do you need assistance with eating?: No Do you have difficulty maintaining continence: No Do you need assistance with getting out of bed/getting out of a chair/moving?: No Do you have difficulty managing or taking your medications?: No  Folllow up appointments reviewed: PCP Follow-up appointment confirmed?: Yes Date of PCP follow-up appointment?: 03/14/22 Follow-up Provider: Karl Ito NP [facility] Follow-up appointment confirmed?: No Do you need transportation to your follow-up appointment?: No Do you understand care options if your condition(s) worsen?: Yes-patient verbalized understanding    [place] LPN [place] Direct Dial [phone]

## 2022-03-10 NOTE — Telephone Encounter (Signed)
Noted. Will review the ED visit

## 2022-03-10 NOTE — Telephone Encounter (Signed)
Called pt and she said she would like to get over covid before starting a new medication. She has an appointment on 03/14/2022 and would let you know then if she is ready to start the medication. She did say she is doing better since she went to the ED.

## 2022-03-13 ENCOUNTER — Encounter: Payer: Self-pay | Admitting: Cardiology

## 2022-03-13 ENCOUNTER — Ambulatory Visit: Payer: PPO | Admitting: Behavioral Health

## 2022-03-13 DIAGNOSIS — H5340 Unspecified visual field defects: Secondary | ICD-10-CM | POA: Diagnosis not present

## 2022-03-14 ENCOUNTER — Other Ambulatory Visit (HOSPITAL_COMMUNITY): Payer: Self-pay

## 2022-03-14 ENCOUNTER — Ambulatory Visit (INDEPENDENT_AMBULATORY_CARE_PROVIDER_SITE_OTHER): Payer: PPO | Admitting: Nurse Practitioner

## 2022-03-14 ENCOUNTER — Other Ambulatory Visit: Payer: Self-pay

## 2022-03-14 ENCOUNTER — Other Ambulatory Visit (HOSPITAL_COMMUNITY): Payer: Self-pay | Admitting: Nurse Practitioner

## 2022-03-14 ENCOUNTER — Encounter: Payer: Self-pay | Admitting: Nurse Practitioner

## 2022-03-14 ENCOUNTER — Other Ambulatory Visit (HOSPITAL_COMMUNITY): Payer: Self-pay | Admitting: *Deleted

## 2022-03-14 VITALS — BP 128/68 | HR 67 | Temp 98.5°F | Resp 16 | Ht 67.0 in | Wt 212.2 lb

## 2022-03-14 DIAGNOSIS — Z09 Encounter for follow-up examination after completed treatment for conditions other than malignant neoplasm: Secondary | ICD-10-CM | POA: Insufficient documentation

## 2022-03-14 DIAGNOSIS — F32A Depression, unspecified: Secondary | ICD-10-CM

## 2022-03-14 DIAGNOSIS — F419 Anxiety disorder, unspecified: Secondary | ICD-10-CM

## 2022-03-14 DIAGNOSIS — R0602 Shortness of breath: Secondary | ICD-10-CM | POA: Diagnosis not present

## 2022-03-14 HISTORY — DX: Encounter for follow-up examination after completed treatment for conditions other than malignant neoplasm: Z09

## 2022-03-14 MED ORDER — FUROSEMIDE 20 MG PO TABS
20.0000 mg | ORAL_TABLET | Freq: Every day | ORAL | 1 refills | Status: DC
  Filled 2022-03-14: qty 30, 30d supply, fill #0

## 2022-03-14 MED ORDER — LEVALBUTEROL TARTRATE 45 MCG/ACT IN AERO
2.0000 | INHALATION_SPRAY | Freq: Four times a day (QID) | RESPIRATORY_TRACT | 1 refills | Status: DC | PRN
  Filled 2022-03-14: qty 15, 25d supply, fill #0
  Filled 2022-09-16 (×2): qty 15, 25d supply, fill #1

## 2022-03-14 NOTE — Assessment & Plan Note (Signed)
Did review hospital note along with labs and imaging

## 2022-03-14 NOTE — Assessment & Plan Note (Signed)
Patient has a history of the same is being followed by cardiology has discussed that with him.  Did review last chest x-ray in the emergency department showed no acute abnormalities.  Patient does not think is secondary to amiodarone use.  She has sent a message to the A-fib clinic waiting to hear back from them.  Continue follow-up with cardiology as recommended

## 2022-03-14 NOTE — Patient Instructions (Signed)
Nice to see you today I have sent in a new inhaler that is less likely to make your heart rate go up. Use it in place of the albuterol Follow up with me in 2 months, sooner if you need me

## 2022-03-14 NOTE — Progress Notes (Signed)
Established Patient Office Visit  Subjective   Patient ID: Shannon [name], female    DOB: 1950-10-24  Age: 72 y.o. MRN: [id number]  Chief Complaint  Patient presents with   [facility] follow up: Patient was seen by me on 04/04/2022 diagnosed with COVID and started on molnupiravir.  Patient was seen in the emergency department on 03/08/2022 for adverse drug event she had follow-up in the atrial fibrillation.  During her stay she self converted to normal sinus rhythm after they gave her her home dose of carvedilol and observed her for an additional hour.  States that she could feel some chest discomfort. States that she got short of breath. States that it has improved but she is still having some shortness of breath   States that she thinks it is the amiodarone that she started in the middle of decemeber. States that she has been having some shortness of breath  since starting the medication. She has been seen by cardiology for the same and was started on a fluid pill.     States that things are better at home. States that the spuse and daughter have been helping out more at the house.    Review of Systems  Constitutional:  Negative for chills and fever.  Respiratory:  Positive for shortness of breath.   Cardiovascular:  Negative for chest pain.  Gastrointestinal:  Positive for constipation. Negative for abdominal pain, nausea and vomiting.  Neurological:  Negative for headaches.  Psychiatric/Behavioral:  Negative for hallucinations and suicidal ideas.       Objective:     BP 128/68   Pulse 67   Temp 98.5 F (36.9 C)   Resp 16   Ht '5\' 7"'$  (1.702 m)   Wt 212 lb 4 oz (96.3 kg)   SpO2 98%   BMI 33.24 kg/m  BP Readings from Last 3 Encounters:  03/14/22 128/68  03/08/22 123/70  03/06/22 132/62   Wt Readings from Last 3 Encounters:  03/14/22 212 lb 4 oz (96.3 kg)  03/06/22 215 lb 2 oz (97.6 kg)  03/03/22 212 lb (96.2 kg)      Physical Exam Vitals  and nursing note reviewed.  Constitutional:      Appearance: Normal appearance.  Cardiovascular:     Rate and Rhythm: Normal rate and regular rhythm.     Heart sounds: Normal heart sounds.  Pulmonary:     Effort: Pulmonary effort is normal.     Breath sounds: Normal breath sounds.  Neurological:     Mental Status: She is alert.      No results found for any visits on 03/14/22.    The ASCVD Risk score (Arnett DK, et al., 2019) failed to calculate for the following reasons:   The patient has a prior MI or stroke diagnosis    Assessment & Plan:   Problem List Items Addressed This Visit       Other   Anxiety and depression    Patient states she has been receiving more support at home and things have been improving.  She like to hold off on the venlafaxine at this juncture until she gets the amiodarone questions answered from the A-fib clinic.  Patient can message me this is prior to her next appointment if she wants to start venlafaxine we will start her on venlafaxine 37.5 mg.      Shortness of breath    Patient has a history of the same is being  followed by cardiology has discussed that with him.  Did review last chest x-ray in the emergency department showed no acute abnormalities.  Patient does not think is secondary to amiodarone use.  She has sent a message to the A-fib clinic waiting to hear back from them.  Continue follow-up with cardiology as recommended      Relevant Medications   levalbuterol (XOPENEX HFA) 45 MCG/ACT inhaler   Hospital discharge follow-up - Primary    Did review hospital note along with labs and imaging       Return in about 2 months (around 05/14/2022) for Mdd/GAD/SHOB recheck .    Romilda Garret, NP

## 2022-03-14 NOTE — Assessment & Plan Note (Signed)
Patient states she has been receiving more support at home and things have been improving.  She like to hold off on the venlafaxine at this juncture until she gets the amiodarone questions answered from the A-fib clinic.  Patient can message me this is prior to her next appointment if she wants to start venlafaxine we will start her on venlafaxine 37.5 mg.

## 2022-03-17 ENCOUNTER — Other Ambulatory Visit (HOSPITAL_COMMUNITY): Payer: Self-pay

## 2022-03-17 ENCOUNTER — Ambulatory Visit (INDEPENDENT_AMBULATORY_CARE_PROVIDER_SITE_OTHER): Payer: PPO

## 2022-03-17 DIAGNOSIS — I4891 Unspecified atrial fibrillation: Secondary | ICD-10-CM

## 2022-03-18 ENCOUNTER — Ambulatory Visit: Payer: Medicare Other | Admitting: Nurse Practitioner

## 2022-03-19 ENCOUNTER — Encounter: Payer: Self-pay | Admitting: Nurse Practitioner

## 2022-03-19 DIAGNOSIS — R051 Acute cough: Secondary | ICD-10-CM

## 2022-03-19 LAB — CUP PACEART REMOTE DEVICE CHECK
Date Time Interrogation Session: 20240310231042
Implantable Pulse Generator Implant Date: 20210301

## 2022-03-19 NOTE — Telephone Encounter (Signed)
I spoke with pt; pt was seen on 03/14/22 for HFU; pt said each day she is feeling better with prod cough with light yellow phlegm and SOB. Pt said no fever,no achiness and not feeling tired.. pt said she can get deep breath without struggling. Pts husband doctor suggested pt get CXR to rule out pneumonia.(Pts husband is in hospital with walking pneumonia). Pt said she does not think she needs to be seen and will leave up to Romilda Garret NP if needs CXR or not. Pt said can call pt back or my chart back after [facility] reviews note. Sending to Romilda Garret NP.

## 2022-03-20 ENCOUNTER — Ambulatory Visit (INDEPENDENT_AMBULATORY_CARE_PROVIDER_SITE_OTHER)
Admission: RE | Admit: 2022-03-20 | Discharge: 2022-03-20 | Disposition: A | Discharge status: Home / Self Care | Payer: PPO | Source: Ambulatory Visit | Attending: Nurse Practitioner | Admitting: Nurse Practitioner

## 2022-03-20 DIAGNOSIS — R051 Acute cough: Secondary | ICD-10-CM | POA: Diagnosis not present

## 2022-03-20 DIAGNOSIS — R059 Cough, unspecified: Secondary | ICD-10-CM | POA: Diagnosis not present

## 2022-03-25 NOTE — Progress Notes (Unsigned)
  Electrophysiology Office Follow up Visit Note:    Date:  03/26/2022   ID:  Shannon [name], DOB 18-Jan-1950, MRN [id number]  PCP:  Michela Pitcher, NP  [facility] Cardiologist:  None  [facility] Electrophysiologist:  Vickie Epley, MD    Interval History:    Shannon [name] is a 72 y.o. female who presents for a follow up visit.  She had an AF ablation 12/13/2021. During the ablation the right sided PV's and posterior wall were ablated. She has experienced AF after the ablation. This was her 3rd ablation. She previously failed dofetilide. After the most recent ablation she was started on amiodarone.  She did not tolerate amiodarone and this medication was stopped.  She has been doing well.  She has been maintaining normal rhythm.  She has recovered well after her COVID infection.  She continues to take Eliquis without bleeding issues.  She feels better after stopping amiodarone.     Past medical, surgical, social and family history were reviewed.  ROS:   Please see the history of present illness.    All other systems reviewed and are negative.  EKGs/Labs/Other Studies Reviewed:    The following studies were reviewed today:    EKG:  The ekg ordered today demonstrates sinus bradycardia, sinus arrhythmia   Physical Exam:    VS:  BP (!) 152/90   Pulse (!) 59   Ht 5\' 7"  (1.702 m)   Wt 209 lb 9.6 oz (95.1 kg)   SpO2 98%   BMI 32.83 kg/m     Wt Readings from Last 3 Encounters:  03/26/22 209 lb 9.6 oz (95.1 kg)  03/14/22 212 lb 4 oz (96.3 kg)  03/06/22 215 lb 2 oz (97.6 kg)     GEN:  Well nourished, well developed in no acute distress CARDIAC: RRR, no murmurs, rubs, gallops RESPIRATORY:  Clear to auscultation without rales, wheezing or rhonchi       ASSESSMENT:    1. Paroxysmal atrial fibrillation (HCC)   2. Primary hypertension   3. Chronic diastolic CHF (congestive heart failure) (HCC)    PLAN:    In order of problems listed  above:  #pAF S/p 3 prior ablations. Failed dofetilide and amiodarone. Continues to have episodes of AF but largely maintaining sinus rhythm. I would recommend continuing Coreg for now.  She can take an additional dose of Coreg should she have a sustained episode of atrial fibrillation that lasts longer than 1 hour.  She can take 1 additional dose of Coreg per day with careful monitoring of her blood pressure.  She is not to take an extra dose of Coreg for systolic blood pressures less than 100 mmHg.  #Hypertension Above goal today.  Recommend checking blood pressures 1-2 times per week at home and recording the values.  Recommend bringing these recordings to the primary care physician.  #HFpEF NYHA II.    Follow-up 6 months with APP.     Signed, Lars Mage, MD, [facility], [facility] 03/26/2022 3:16 PM    Electrophysiology [facility]

## 2022-03-26 ENCOUNTER — Ambulatory Visit: Payer: PPO | Attending: Cardiology | Admitting: Cardiology

## 2022-03-26 ENCOUNTER — Encounter: Payer: Self-pay | Admitting: Cardiology

## 2022-03-26 VITALS — BP 152/90 | HR 59 | Ht 67.0 in | Wt 209.6 lb

## 2022-03-26 DIAGNOSIS — I480 Paroxysmal atrial fibrillation: Secondary | ICD-10-CM | POA: Diagnosis not present

## 2022-03-26 DIAGNOSIS — I10 Essential (primary) hypertension: Secondary | ICD-10-CM | POA: Diagnosis not present

## 2022-03-26 DIAGNOSIS — I5032 Chronic diastolic (congestive) heart failure: Secondary | ICD-10-CM | POA: Diagnosis not present

## 2022-03-26 NOTE — Patient Instructions (Signed)
Medication Instructions:  Your physician recommends that you continue on your current medications as directed. Please refer to the Current Medication list given to you today. *If you need a refill on your cardiac medications before your next appointment, please call your pharmacy*   Follow-Up: At [facility], you and your health needs are our priority.  As part of our continuing mission to provide you with exceptional heart care, we have created designated Provider Care Teams.  These Care Teams include your primary Cardiologist (physician) and Advanced Practice Providers (APPs -  Physician Assistants and Nurse Practitioners) who all work together to provide you with the care you need, when you need it.  [facility] [phone]  Your next appointment:   6 month(s)  Provider:   You will see one of the following Advanced Practice Providers on your designated Care Team:   [name], [place] "[facility]" [place], [place]

## 2022-03-31 NOTE — Progress Notes (Signed)
Carelink Summary Report / Loop Recorder 

## 2022-04-07 ENCOUNTER — Other Ambulatory Visit (HOSPITAL_COMMUNITY): Payer: Self-pay

## 2022-04-17 ENCOUNTER — Ambulatory Visit: Payer: PPO | Admitting: Nurse Practitioner

## 2022-04-17 NOTE — Progress Notes (Signed)
Pt was being evaluated for mental health problem and per protocol is to check covid test to facilitate possible placement.

## 2022-04-18 DIAGNOSIS — H5340 Unspecified visual field defects: Secondary | ICD-10-CM | POA: Diagnosis not present

## 2022-04-21 ENCOUNTER — Ambulatory Visit (INDEPENDENT_AMBULATORY_CARE_PROVIDER_SITE_OTHER): Payer: Medicare Other

## 2022-04-21 DIAGNOSIS — I480 Paroxysmal atrial fibrillation: Secondary | ICD-10-CM | POA: Diagnosis not present

## 2022-04-22 LAB — CUP PACEART REMOTE DEVICE CHECK
Date Time Interrogation Session: 20240414231348
Implantable Pulse Generator Implant Date: 20210301

## 2022-04-23 ENCOUNTER — Encounter: Payer: Self-pay | Admitting: Behavioral Health

## 2022-04-23 ENCOUNTER — Ambulatory Visit (INDEPENDENT_AMBULATORY_CARE_PROVIDER_SITE_OTHER): Payer: Medicare Other | Admitting: Behavioral Health

## 2022-04-23 DIAGNOSIS — F4323 Adjustment disorder with mixed anxiety and depressed mood: Secondary | ICD-10-CM

## 2022-04-23 NOTE — Progress Notes (Signed)
                Marlaine Arey M Parker Wherley, LCMHC 
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
## 2022-04-24 ENCOUNTER — Ambulatory Visit: Payer: PPO | Admitting: Clinical

## 2022-04-30 NOTE — Progress Notes (Signed)
Carelink Summary Report / Loop Recorder 

## 2022-05-01 ENCOUNTER — Telehealth: Payer: Self-pay | Admitting: Nurse Practitioner

## 2022-05-01 NOTE — Telephone Encounter (Signed)
Prescription Request  05/01/2022  LOV: 03/14/2022  What is the name of the medication or equipment? potassium chloride (KLOR-CON) 10 MEQ tablet  simvastatin (ZOCOR) 20 MG tablet  furosemide (LASIX) 20 MG tablet   Have you contacted your pharmacy to request a refill? Yes   Which pharmacy would you like this sent to?   [facility] - [place], [place] - [address] [place] [address] Phone: [phone] Fax: [phone]    Patient notified that their request is being sent to the clinical staff for review and that they should receive a response within 2 business days.   Please advise at [facility] [phone]

## 2022-05-02 ENCOUNTER — Other Ambulatory Visit (HOSPITAL_COMMUNITY): Payer: Self-pay | Admitting: *Deleted

## 2022-05-02 ENCOUNTER — Ambulatory Visit (INDEPENDENT_AMBULATORY_CARE_PROVIDER_SITE_OTHER): Payer: Medicare Other | Admitting: Behavioral Health

## 2022-05-02 ENCOUNTER — Encounter: Payer: Self-pay | Admitting: Behavioral Health

## 2022-05-02 ENCOUNTER — Other Ambulatory Visit: Payer: Self-pay

## 2022-05-02 DIAGNOSIS — F4323 Adjustment disorder with mixed anxiety and depressed mood: Secondary | ICD-10-CM

## 2022-05-02 MED ORDER — SIMVASTATIN 20 MG PO TABS: 20.0000 mg | ORAL_TABLET | Freq: Every day | ORAL | 5 refills | Status: DC

## 2022-05-02 MED ORDER — FUROSEMIDE 20 MG PO TABS: 20.0000 mg | ORAL_TABLET | Freq: Every day | ORAL | 2 refills | Status: DC

## 2022-05-02 MED ORDER — POTASSIUM CHLORIDE ER 10 MEQ PO TBCR: 10.0000 meq | EXTENDED_RELEASE_TABLET | Freq: Every day | ORAL | 2 refills | Status: DC

## 2022-05-02 NOTE — Progress Notes (Signed)
[facility] Counselor/Therapist Progress Note  Patient ID: Shannon [name], MRN: [id number],    Date: 05/02/2022  Time Spent: 57 minutes spent via video session.  The patient was at home and this therapist was in his home office.  Treatment Type: Individual Therapy  Reported Symptoms: Anxiety, depression  Mental Status Exam: Appearance:  Well Groomed     Behavior: Appropriate  Motor: Normal  Speech/Language:  Clear and Coherent  Affect: Appropriate  Mood: normal  Thought process: normal  Thought content:   WNL  Sensory/Perceptual disturbances:   WNL  Orientation: oriented to person, place, time/date, situation, day of week, month of year, and year  Attention: Good  Concentration: Good  Memory: WNL  Fund of knowledge:  Good  Insight:   Good  Judgment:  Good  Impulse Control: Good   Risk Assessment: Danger to Self:  No Self-injurious Behavior: No Danger to Others: No Duty to Warn:no Physical Aggression / Violence:No  Access to Firearms a concern: No  Gang Involvement:No   Subjective: To use cognitive behavioral therapy principles as well as elements of dialectical behavior therapy.  Goals are to reduce anxiety and depression by at least 50% with a target date of November 06, 2022.  Goals are to have less sadness as indicated by PHQ-9 scores as well as patient report.  We also work on improving mood and return to a healthier level of functioning as defined by her goals for being happy, identify causes and process triggers for depressed mood.  We will use cognitive behavioral therapy to explore and replace unhealthy thoughts and behavior patterns contributing to depression.  I will continue to encourage shearing of feelings related to the causes and symptoms of depression as well as teach and encouraged use of coping skills for management of depressive symptoms.  We also will work to improve the patient's ability to manage anxiety symptoms and better handle  stress, identify causes for anxiety and explore ways for reduction of anxiety in addition to resolving conflicts contributing to anxiety and managing thoughts and worrisome thinking is contributing to anxiety.  Interventions will include providing education about anxiety, facilitate problem solution skills, teaching coping skills for managing anxiety such as grounding exercises, progressive muscle relaxation and cognitive reframing etc.  We will also use cognitive behavioral therapy to identify and change anxiety provoking thoughts and behavior patterns as well as using DBT distress tolerance and mindfulness skills. Interventions: Cognitive Behavioral Therapy  Diagnosis: Adjustment disorder with mixed anxiety and depressed mood.  Plan: I will meet with the patient every 2 weeks via video session  [name], [facility]

## 2022-05-02 NOTE — Telephone Encounter (Signed)
The potassium and furosemide is cardiology correct

## 2022-05-02 NOTE — Telephone Encounter (Signed)
PT switching pharmacy and she needs a refill sent to [facility].

## 2022-05-05 ENCOUNTER — Telehealth: Payer: Self-pay | Admitting: Nurse Practitioner

## 2022-05-05 NOTE — Telephone Encounter (Signed)
Contacted Bricelyn Freestone to schedule their annual wellness visit. Appointment made for 05/12/2022.  [facility] Care Guide [facility] AWV TEAM Direct Dial: [phone]

## 2022-05-08 ENCOUNTER — Ambulatory Visit: Payer: PPO | Admitting: Behavioral Health

## 2022-05-14 ENCOUNTER — Ambulatory Visit (INDEPENDENT_AMBULATORY_CARE_PROVIDER_SITE_OTHER): Payer: Medicare Other | Admitting: Nurse Practitioner

## 2022-05-14 ENCOUNTER — Encounter: Payer: Self-pay | Admitting: Nurse Practitioner

## 2022-05-14 VITALS — BP 122/68 | HR 64 | Temp 97.7°F | Resp 16 | Ht 67.0 in | Wt 206.2 lb

## 2022-05-14 DIAGNOSIS — R0602 Shortness of breath: Secondary | ICD-10-CM

## 2022-05-14 DIAGNOSIS — F411 Generalized anxiety disorder: Secondary | ICD-10-CM | POA: Diagnosis not present

## 2022-05-14 MED ORDER — BUSPIRONE HCL 5 MG PO TABS: 5.0000 mg | ORAL_TABLET | Freq: Two times a day (BID) | ORAL | 2 refills | Status: DC

## 2022-05-14 NOTE — Assessment & Plan Note (Signed)
Improved some since the discontinuation of amiodarone.

## 2022-05-14 NOTE — Patient Instructions (Signed)
Nice to see you today I have started you on buspar Follow up with me in 3 months, sooner if you need me

## 2022-05-14 NOTE — Progress Notes (Signed)
Established Patient Office Visit  Subjective   Patient ID: Shannon [name], female    DOB: 17-Jun-1950  Age: 72 y.o. MRN: [id number]  Chief Complaint  Patient presents with   Anxiety      SHOB: Patient was seen in the emergency department and follow-up with me complaining of shortness of breath and fatigue Sital secondary to amiodarone.  She is being followed by cardiology.  I did write patient a levoalbuterol inhaler and encouraged her to keep follow-up with cardiology and have conversation about amiodarone.  Per last cardiology note they stop amiodarone. States that her vision is clearing up some. States that her fatigue may have been getting better. She thinks it is improving. States that her shortness of breath has improved  MDD/GAD: Patient was under large amount of stress at home dealing with spouse and his chronic illnesses along with her daughter who lives with them and her grandchild who she watches majority of the time on her daughter works.  Will start patient on venlafaxine 37.5 mg the patient would like to hold off until she gets the amiodarone medication straightened out.  She is here for recheck  States things at home are not any better.  Her spouse lives upstairs she was downstairs her daughter and grandchild still lives in the house.  Patient states spouse breaks preset rules that they both agreed on.  This frustrates her and caused her to lose her patience at times.  Patient is currently seeing a therapist.  We did revisit the idea of an SNRI versus SSRI.  Patient states she has tried these medications in the past and had adverse drug events such as being blunted or being hyped up.  Patient states she does have the ability to move out of the family home and stay with sisters her friend but does not want to leave her family    Review of Systems  Constitutional:  Negative for chills and fever.  Respiratory:  Positive for shortness of breath.   Cardiovascular:  Negative  for chest pain.  Neurological:  Negative for headaches.  Psychiatric/Behavioral:  Negative for hallucinations and suicidal ideas.       Objective:     BP 122/68   Pulse 64   Temp 97.7 F (36.5 C)   Resp 16   Ht 5\' 7"  (1.702 m)   Wt 206 lb 4 oz (93.6 kg)   SpO2 98%   BMI 32.30 kg/m  BP Readings from Last 3 Encounters:  05/14/22 122/68  03/26/22 (!) 152/90  03/14/22 128/68   Wt Readings from Last 3 Encounters:  05/14/22 206 lb 4 oz (93.6 kg)  03/26/22 209 lb 9.6 oz (95.1 kg)  03/14/22 212 lb 4 oz (96.3 kg)      Physical Exam Vitals and nursing note reviewed.  Constitutional:      Appearance: Normal appearance.  Cardiovascular:     Rate and Rhythm: Normal rate and regular rhythm.     Heart sounds: Normal heart sounds.  Pulmonary:     Effort: Pulmonary effort is normal.     Breath sounds: Normal breath sounds.  Neurological:     Mental Status: She is alert.      No results found for any visits on 05/14/22.    The ASCVD Risk score (Arnett DK, et al., 2019) failed to calculate for the following reasons:   The patient has a prior MI or stroke diagnosis    Assessment & Plan:   Problem List Items Addressed  This Visit       Other   Shortness of breath    Improved some since the discontinuation of amiodarone.      GAD (generalized anxiety disorder) - Primary    Will start patient on buspirone 5 mg twice daily encourage patient continue using counselor/therapy services.  Follow-up 3 months for CPE and labs with recheck patient to follow-up sooner if needed in regards to anxiolytic medication she can also continue using lorazepam 0.5 mg as needed      Relevant Medications   busPIRone (BUSPAR) 5 MG tablet    Return in about 3 months (around 08/14/2022) for CPE and Labs.    Audria Nine, NP

## 2022-05-14 NOTE — Assessment & Plan Note (Signed)
Will start patient on buspirone 5 mg twice daily encourage patient continue using counselor/therapy services.  Follow-up 3 months for CPE and labs with recheck patient to follow-up sooner if needed in regards to anxiolytic medication she can also continue using lorazepam 0.5 mg as needed

## 2022-05-16 ENCOUNTER — Encounter (HOSPITAL_BASED_OUTPATIENT_CLINIC_OR_DEPARTMENT_OTHER): Payer: Self-pay

## 2022-05-16 ENCOUNTER — Ambulatory Visit (INDEPENDENT_AMBULATORY_CARE_PROVIDER_SITE_OTHER): Payer: Medicare Other | Admitting: Behavioral Health

## 2022-05-16 ENCOUNTER — Other Ambulatory Visit: Payer: Self-pay

## 2022-05-16 ENCOUNTER — Encounter: Payer: Self-pay | Admitting: Behavioral Health

## 2022-05-16 DIAGNOSIS — Z8673 Personal history of transient ischemic attack (TIA), and cerebral infarction without residual deficits: Secondary | ICD-10-CM | POA: Diagnosis not present

## 2022-05-16 DIAGNOSIS — X58XXXA Exposure to other specified factors, initial encounter: Secondary | ICD-10-CM | POA: Diagnosis not present

## 2022-05-16 DIAGNOSIS — F411 Generalized anxiety disorder: Secondary | ICD-10-CM

## 2022-05-16 DIAGNOSIS — Y9301 Activity, walking, marching and hiking: Secondary | ICD-10-CM | POA: Diagnosis not present

## 2022-05-16 DIAGNOSIS — Z7901 Long term (current) use of anticoagulants: Secondary | ICD-10-CM | POA: Insufficient documentation

## 2022-05-16 DIAGNOSIS — F331 Major depressive disorder, recurrent, moderate: Secondary | ICD-10-CM

## 2022-05-16 DIAGNOSIS — S9031XA Contusion of right foot, initial encounter: Secondary | ICD-10-CM | POA: Insufficient documentation

## 2022-05-16 DIAGNOSIS — R2241 Localized swelling, mass and lump, right lower limb: Secondary | ICD-10-CM | POA: Diagnosis not present

## 2022-05-16 DIAGNOSIS — M79671 Pain in right foot: Secondary | ICD-10-CM | POA: Diagnosis not present

## 2022-05-16 NOTE — Progress Notes (Signed)
[facility] Counselor/Therapist Progress Note  Patient ID: Shannon [name], MRN: [id number],    Date: 05/16/2022  Time Spent: 57 minutes spent via video session.  The patient was at home and this therapist was in his home office.  Treatment Type: Individual Therapy  Reported Symptoms: Anxiety, depression  Mental Status Exam: Appearance:  Well Groomed     Behavior: Appropriate  Motor: Normal  Speech/Language:  Clear and Coherent  Affect: Appropriate  Mood: normal  Thought process: normal  Thought content:   WNL  Sensory/Perceptual disturbances:   WNL  Orientation: oriented to person, place, time/date, situation, day of week, month of year, and year  Attention: Good  Concentration: Good  Memory: WNL  Fund of knowledge:  Good  Insight:   Good  Judgment:  Good  Impulse Control: Good   Risk Assessment: Danger to Self:  No Self-injurious Behavior: No Danger to Others: No Duty to Warn:no Physical Aggression / Violence:No  Access to Firearms a concern: No  Gang Involvement:No   Subjective: For the most part the patient has seen some improvement in things.  One of the issues over the past couple of weeks was understanding if her husband had an Brewing technologist and if he did who is aware of.  She had asked him repeatedly and he was not answering her correctly and he got frustrated and she got angry.  Her daughter was able to ask any provided it.  She is trying to make sure that she handles everything as well as possible and that was one of the last pieces that she needed.  He found out that he does not have lung cancer but is not sure exactly what it was as he is not necessarily sharing that with her.  She is trying to stick to very strict boundaries in terms of how they set it up to live amicably in the house.  There was a situation where he tried to break 1 of those boundaries but she reestablish it and more definitive way and feels that he will be honored.  We  talked about the importance of mental emotional and physical boundaries etc.  We talked about the importance of use of mindfulness and coping skills to challenge those anxious or irritable type thoughts.  I did introduce progressive muscle relaxation encouraging her to practice it.  She has used visualization especially with her faith in the past and encouraged her to practice that again. She does contract for safety having no thoughts of hurting herself or anyone else. Interventions: Cognitive Behavioral Therapy  Diagnosis: Adjustment disorder with mixed anxiety and depressed mood.  Plan: I will meet with the patient every 2 weeks via video session Tx. Plan:To use cognitive behavioral therapy principles as well as elements of dialectical behavior therapy.  Goals are to reduce anxiety and depression by at least 50% with a target date of November 06, 2022.  Goals are to have less sadness as indicated by PHQ-9 scores as well as patient report.  We also work on improving mood and return to a healthier level of functioning as defined by her goals for being happy, identify causes and process triggers for depressed mood.  We will use cognitive behavioral therapy to explore and replace unhealthy thoughts and behavior patterns contributing to depression.  I will continue to encourage shearing of feelings related to the causes and symptoms of depression as well as teach and encouraged use of coping skills for management of depressive symptoms.  We also  will work to improve the patient's ability to manage anxiety symptoms and better handle stress, identify causes for anxiety and explore ways for reduction of anxiety in addition to resolving conflicts contributing to anxiety and managing thoughts and worrisome thinking is contributing to anxiety.  Interventions will include providing education about anxiety, facilitate problem solution skills, teaching coping skills for managing anxiety such as grounding exercises,  progressive muscle relaxation and cognitive reframing etc.  We will also use cognitive behavioral therapy to identify and change anxiety provoking thoughts and behavior patterns as well as using DBT distress tolerance and mindfulness skills. [name], [facility]                 [name], [place]

## 2022-05-16 NOTE — ED Triage Notes (Signed)
Patient arrives with complaints of worsening right foot/ankle swelling that started today. Patient is concerned about a fracture or blood clot. No falls or injuries.  Patient did have a past stroke.   Rates pain a 3/10.

## 2022-05-17 ENCOUNTER — Emergency Department (HOSPITAL_BASED_OUTPATIENT_CLINIC_OR_DEPARTMENT_OTHER): Payer: Medicare Other

## 2022-05-17 ENCOUNTER — Emergency Department (HOSPITAL_BASED_OUTPATIENT_CLINIC_OR_DEPARTMENT_OTHER)
Admission: EM | Admit: 2022-05-17 | Discharge: 2022-05-17 | Disposition: A | Discharge status: Home / Self Care | Payer: Medicare Other | Attending: Emergency Medicine | Admitting: Emergency Medicine

## 2022-05-17 DIAGNOSIS — M7989 Other specified soft tissue disorders: Secondary | ICD-10-CM | POA: Diagnosis not present

## 2022-05-17 DIAGNOSIS — M79671 Pain in right foot: Secondary | ICD-10-CM | POA: Diagnosis not present

## 2022-05-17 DIAGNOSIS — M25571 Pain in right ankle and joints of right foot: Secondary | ICD-10-CM | POA: Diagnosis not present

## 2022-05-17 DIAGNOSIS — S9031XA Contusion of right foot, initial encounter: Secondary | ICD-10-CM

## 2022-05-17 NOTE — ED Provider Notes (Signed)
[facility] AT [facility]  Provider Note  CSN: [id number] Arrival date & time: 05/16/22 2146  History Chief Complaint  Patient presents with   Foot Pain    right   Foot Swelling    Shannon [name] is a 72 y.o. female with history of afib on Eliquis reports she was on her feet walking and shopping earlier today which is not typical for her. She noticed some R foot pain and swelling in the evening and family was worried about a blood clot. She was seen at [facility] but apparently they did not have xrays available and advised her to come to the ED to evaluate a fracture. She is not aware of any direct trauma or other injuries. She is otherwise feeling at baseline.    Home Medications Prior to Admission medications   Medication Sig Start Date End Date Taking? Authorizing Provider  acetaminophen (TYLENOL) 500 MG tablet Take 1,000 mg by mouth every 6 (six) hours as needed for moderate pain or headache.    [provider]  albuterol (VENTOLIN HFA) 108 (90 Base) MCG/ACT inhaler Inhale 1-2 puffs into the lungs every 6 (six) hours as needed for wheezing or shortness of breath. 12/23/19   Parrett, Virgel Bouquet, NP  amLODipine (NORVASC) 5 MG tablet Take 1 tablet (5 mg total) by mouth daily. 02/04/22   Newman Nip, NP  apixaban (ELIQUIS) 5 MG TABS tablet Take 1 tablet (5 mg total) by mouth 2 (two) times daily. 01/09/22   Eden Emms, NP  busPIRone (BUSPAR) 5 MG tablet Take 1 tablet (5 mg total) by mouth 2 (two) times daily. 05/14/22   Eden Emms, NP  carvedilol (COREG) 12.5 MG tablet Take 1 tablet (12.5 mg total) by mouth 2 (two) times daily. 12/16/21   Graciella Freer, PA-C  Cholecalciferol (VITAMIN D) 50 MCG (2000 UT) CAPS Take 2,000 Units by mouth in the morning.    [provider]  furosemide (LASIX) 20 MG tablet Take 1 tablet (20 mg total) by mouth daily. 05/02/22   Lanier Prude, MD  Hydrocortisone (CORTIZONE-10 EX) Apply 1  application  topically as needed (skin irritation/itching).    [provider]  levalbuterol Pauline Aus HFA) 45 MCG/ACT inhaler Inhale 2 puffs into the lungs every 6 (six) hours as needed for wheezing or shortness of breath. 03/14/22   Eden Emms, NP  loratadine (CLARITIN) 10 MG tablet Take 10 mg by mouth daily as needed for allergies.    [provider]  LORazepam (ATIVAN) 0.5 MG tablet Take 1 tablet (0.5 mg total) by mouth every 8 (eight) hours as needed for anxiety. 01/09/22   Eden Emms, NP  meclizine (ANTIVERT) 12.5 MG tablet Take 1 tablet (12.5 mg total) by mouth 3 (three) times daily as needed for dizziness. 09/02/21   Tanda Rockers A, DO  Na Sulfate-K Sulfate-Mg Sulf (SUPREP BOWEL PREP KIT) 17.5-3.13-1.6 GM/177ML SOLN Take 1 kit by mouth as directed. For colonoscopy prep 01/31/21   Meredith Pel, NP  potassium chloride (KLOR-CON) 10 MEQ tablet Take 1 tablet (10 mEq total) by mouth daily. 05/02/22   Lanier Prude, MD  simvastatin (ZOCOR) 20 MG tablet Take 1 tablet (20 mg total) by mouth daily. 05/02/22   Eden Emms, NP  triamcinolone (NASACORT) 55 MCG/ACT AERO nasal inhaler Place 1 spray into the nose daily as needed (allergies).    [provider]     Allergies    Ace inhibitors,  Amiodarone, Elemental sulfur, Hctz [hydrochlorothiazide], Oxycodone, Prednisone, Sulfa antibiotics, and Voltaren [diclofenac sodium]   Review of Systems   Review of Systems Please see HPI for pertinent positives and negatives  Physical Exam BP (!) 156/76   Pulse 71   Temp 98 F (36.7 C)   Resp 18   Ht 5\' 7"  (1.702 m)   Wt 93.6 kg   SpO2 100%   BMI 32.32 kg/m   Physical Exam Vitals and nursing note reviewed.  HENT:     Head: Normocephalic.     Nose: Nose normal.  Eyes:     Extraocular Movements: Extraocular movements intact.  Pulmonary:     Effort: Pulmonary effort is normal.  Musculoskeletal:        General: Swelling (mild, soft tissue R foot. There is  some bruising to the dorsal foot) and tenderness (mild, dorsal foot, no focal bony tenderness) present. Normal range of motion.     Cervical back: Neck supple.  Skin:    Findings: No rash (on exposed skin).  Neurological:     Mental Status: She is alert and oriented to person, place, and time.  Psychiatric:        Mood and Affect: Mood normal.     ED Results / Procedures / Treatments   EKG None  Procedures Procedures  Medications Ordered in the ED Medications - No data to display  Initial Impression and Plan  Patient here with R foot pain/swelling. Some bruising may be due to overuse/Eliquis. I personally viewed the images from radiology studies and agree with radiologist interpretation: Xrays are neg for acute bony injury. No concern for DVT given her Eliquis and lack of proximal symptoms. Reassured no concerning findings in the ED. Recommend rest, ice and elevation. She has compression stockings and ACE wrap at home she can use.   ED Course       MDM Rules/Calculators/A&P Medical Decision Making Problems Addressed: Contusion of right foot, initial encounter: acute illness or injury  Amount and/or Complexity of Data Reviewed Radiology: ordered and independent interpretation performed. Decision-making details documented in ED Course.     Final Clinical Impression(s) / ED Diagnoses Final diagnoses:  Contusion of right foot, initial encounter    Rx / DC Orders ED Discharge Orders     None        Pollyann Savoy, MD 05/17/22 [phone]

## 2022-05-19 ENCOUNTER — Telehealth: Payer: Self-pay

## 2022-05-19 ENCOUNTER — Encounter: Payer: Self-pay | Admitting: Nurse Practitioner

## 2022-05-19 ENCOUNTER — Ambulatory Visit (INDEPENDENT_AMBULATORY_CARE_PROVIDER_SITE_OTHER): Payer: Medicare Other | Admitting: Nurse Practitioner

## 2022-05-19 VITALS — BP 118/64 | HR 60 | Temp 98.2°F | Resp 16 | Ht 67.0 in | Wt 207.2 lb

## 2022-05-19 DIAGNOSIS — M7989 Other specified soft tissue disorders: Secondary | ICD-10-CM | POA: Diagnosis not present

## 2022-05-19 HISTORY — DX: Other specified soft tissue disorders: M79.89

## 2022-05-19 NOTE — Telephone Encounter (Signed)
Evaluated in office  

## 2022-05-19 NOTE — Progress Notes (Signed)
   Acute Office Visit  Subjective:     Patient ID: Shannon [name], female    DOB: [birth year], 72 y.o.   MRN: [id number]  Chief Complaint  Patient presents with   Foot Pain    Lump on top right foot hurts and itches.    Foot Pain   Patient is in today for foot pain  Patient was seen in the emergency department on 05/17/2022 for right-sided foot pain and swelling.  Patient has a history of A-fib on Eliquis.  States that she was shopping earlier that day noticed some right foot pain and swelling in the evening became worried of a blood clot.  Seen in urgent care they did not have x-ray available and advised her to come to the ED for further evaluation. The ed did a foot and ankle film that showed swelling but no acute fracture.  She is here today for a follow up   States that she was on her feet for hours that day. States that it popped up all of a sudden. States that when she got home.States that she went ot feed her grandchild and placed her foot on the high chair and noticed pain    Review of Systems  Cardiovascular:  Positive for leg swelling.  Musculoskeletal:  Positive for joint pain.  Skin:  Positive for itching.        Objective:    BP 118/64   Pulse 60   Temp 98.2 F (36.8 C)   Resp 16   Ht 5\' 7"  (1.702 m)   Wt 207 lb 4 oz (94 kg)   SpO2 98%   BMI 32.46 kg/m    Physical Exam Musculoskeletal:        General: Swelling and tenderness present. No deformity or signs of injury.       Legs:     No results found for any visits on 05/19/22.      Assessment & Plan:   Problem List Items Addressed This Visit       Other   Foot swelling - Primary    No discrete injury per patient report.  Has improved since being seen in the emergency department.  They did image the ankle and foot that did not show any acute fractures.  Patient to elevate lower limb when she can and use ice 20 minutes on and take a break avoid direct ice to skin contact.  Follow-up if no  improvement       No orders of the defined types were placed in this encounter.   Return if symptoms worsen or fail to improve, for As scheduled.  Audria Nine, NP

## 2022-05-19 NOTE — Transitions of Care (Post Inpatient/ED Visit) (Signed)
I spoke with pt and she said the rt foot is still swollen with knot on top of rt foot. Pt has been resting, icing area, and elevating rt foot. Pt is also using ace bandage as instructed by ED. Pt is unware of injury unless dog stepped on foot. Pt is presently taking eliquis.Pt already has appt to see Audria Nine 05/19/22 at 12 noon with UC & ED precautions.sending note to Audria Nine NP.      05/19/2022  Name: Kinzi Drum MRN: [id number] DOB: 16-Jul-1950  Today's TOC FU Call Status: Today's TOC FU Call Status:: Successful TOC FU Call Competed TOC FU Call Complete Date: 05/19/22  Transition Care Management Follow-up Telephone Call Date of Discharge: 05/17/22 Discharge Facility: [facility] ([facility]) Type of Discharge: Emergency Department Reason for ED Visit: Other: (contusion rt foot) How have you been since you were released from the hospital?: Same (still pain in rt foot with bruising. has lump on top of foot.no known injury. ? if dog stepped on foot.) Any questions or concerns?: No  Items Reviewed: Did you receive and understand the discharge instructions provided?: Yes Medications obtained,verified, and reconciled?: Yes (Medications Reviewed) (pt resting,icing foot and elevation when sitting or laying.) Any new allergies since your discharge?: No Dietary orders reviewed?: NA Do you have support at home?: Yes People in Home: child(ren), adult Name of Support/Comfort Primary Source: Casie  Medications Reviewed Today: Medications Reviewed Today     Reviewed by [name], [facility] (Counselor) on 05/16/22 at 1643  Med List Status: <None>   Medication Order Taking? Sig Documenting Provider Last Dose Status Informant  acetaminophen (TYLENOL) 500 MG tablet [id number] No Take 1,000 mg by mouth every 6 (six) hours as needed for moderate pain or headache. [provider] Taking Active Self  albuterol (VENTOLIN HFA) 108 (90 Base) MCG/ACT inhaler [id number] No Inhale  1-2 puffs into the lungs every 6 (six) hours as needed for wheezing or shortness of breath. Parrett, Virgel Bouquet, NP Taking Active Self  amLODipine (NORVASC) 5 MG tablet [id number] No Take 1 tablet (5 mg total) by mouth daily. Newman Nip, NP Taking Active   apixaban (ELIQUIS) 5 MG TABS tablet [id number] No Take 1 tablet (5 mg total) by mouth 2 (two) times daily. Eden Emms, NP Taking Active   busPIRone (BUSPAR) 5 MG tablet [id number]  Take 1 tablet (5 mg total) by mouth 2 (two) times daily. Eden Emms, NP  Active   carvedilol (COREG) 12.5 MG tablet [id number] No Take 1 tablet (12.5 mg total) by mouth 2 (two) times daily. Graciella Freer, PA-C Taking Active   Cholecalciferol (VITAMIN D) 50 MCG (2000 UT) CAPS [id number] No Take 2,000 Units by mouth in the morning. [provider] Taking Active Self  furosemide (LASIX) 20 MG tablet [id number] No Take 1 tablet (20 mg total) by mouth daily. Lanier Prude, MD Taking Active   Hydrocortisone [facility]) [id number] No Apply 1 application  topically as needed (skin irritation/itching). [provider] Taking Active Self  levalbuterol [facility] HFA) 45 MCG/ACT inhaler [id number] No Inhale 2 puffs into the lungs every 6 (six) hours as needed for wheezing or shortness of breath. Eden Emms, NP Taking Active   loratadine (CLARITIN) 10 MG tablet [id number] No Take 10 mg by mouth daily as needed for allergies. [provider] Taking Active Self           Med Note (MCNEIL, KAREN A   Wed Mar 26, 2022  3:01  PM)    LORazepam (ATIVAN) 0.5 MG tablet [id number] No Take 1 tablet (0.5 mg total) by mouth every 8 (eight) hours as needed for anxiety. Eden Emms, NP Taking Active   meclizine (ANTIVERT) 12.5 MG tablet [id number] No Take 1 tablet (12.5 mg total) by mouth 3 (three) times daily as needed for dizziness. Sloan Leiter, DO Taking Active Self           Med Note (MCNEIL, KAREN A   Wed Mar 26, 2022  3:01 PM)    Na  Sulfate-K Sulfate-Mg Sulf (SUPREP BOWEL PREP KIT) 17.5-3.13-1.6 GM/177ML SOLN [id number] No Take 1 kit by mouth as directed. For colonoscopy prep Meredith Pel, NP Taking Active Self           Med Note (MCNEIL, KAREN A   Wed Mar 26, 2022  3:01 PM)    potassium chloride (KLOR-CON) 10 MEQ tablet [id number] No Take 1 tablet (10 mEq total) by mouth daily. Lanier Prude, MD Taking Active   simvastatin (ZOCOR) 20 MG tablet [id number] No Take 1 tablet (20 mg total) by mouth daily. Eden Emms, NP Taking Active   triamcinolone (NASACORT) 55 MCG/ACT AERO nasal inhaler [id number] No Place 1 spray into the nose daily as needed (allergies). [provider] Taking Active Self           Med Note (MCNEIL, KAREN A   Wed Mar 26, 2022  3:01 PM)              Home Care and Equipment/Supplies: Were Home Health Services Ordered?: NA Any new equipment or medical supplies ordered?: NA  Functional Questionnaire: Do you need assistance with bathing/showering or dressing?: No Do you need assistance with meal preparation?: No Do you need assistance with eating?: No Do you have difficulty maintaining continence: No Do you need assistance with getting out of bed/getting out of a chair/moving?: No Do you have difficulty managing or taking your medications?: No  Follow up appointments reviewed: PCP Follow-up appointment confirmed?: Yes Date of PCP follow-up appointment?: 05/19/22 Follow-up Provider: Mordecai Maes NP [facility] Follow-up appointment confirmed?: NA Do you need transportation to your follow-up appointment?: No Do you understand care options if your condition(s) worsen?: Yes-patient verbalized understanding    SIGNATURE Lewanda Rife, LPN

## 2022-05-19 NOTE — Assessment & Plan Note (Signed)
No discrete injury per patient report.  Has improved since being seen in the emergency department.  They did image the ankle and foot that did not show any acute fractures.  Patient to elevate lower limb when she can and use ice 20 minutes on and take a break avoid direct ice to skin contact.  Follow-up if no improvement

## 2022-05-19 NOTE — Patient Instructions (Signed)
Elevate the foot when you are sitting You can use ice to help with the swelling Follow up if it does not improve or as scheduled

## 2022-05-23 ENCOUNTER — Encounter: Payer: Self-pay | Admitting: Behavioral Health

## 2022-05-23 ENCOUNTER — Ambulatory Visit (INDEPENDENT_AMBULATORY_CARE_PROVIDER_SITE_OTHER): Payer: Medicare Other | Admitting: Behavioral Health

## 2022-05-23 DIAGNOSIS — F411 Generalized anxiety disorder: Secondary | ICD-10-CM | POA: Diagnosis not present

## 2022-05-23 NOTE — Progress Notes (Signed)
[facility] Counselor/Therapist Progress Note  Patient ID: Shannon [name], MRN: [id number],    Date: 05/23/2022  Time Spent: 53 minutes spent via video session.  The patient was at home and this therapist was in his home office.  Treatment Type: Individual Therapy  Reported Symptoms: Anxiety, depression  Mental Status Exam: Appearance:  Well Groomed     Behavior: Appropriate  Motor: Normal  Speech/Language:  Clear and Coherent  Affect: Appropriate  Mood: normal  Thought process: normal  Thought content:   WNL  Sensory/Perceptual disturbances:   WNL  Orientation: oriented to person, place, time/date, situation, day of week, month of year, and year  Attention: Good  Concentration: Good  Memory: WNL  Fund of knowledge:  Good  Insight:   Good  Judgment:  Good  Impulse Control: Good   Risk Assessment: Danger to Self:  No Self-injurious Behavior: No Danger to Others: No Duty to Warn:no Physical Aggression / Violence:No  Access to Firearms a concern: No  Gang Involvement:No   Subjective: The patient report that for the most part the past couple of weeks have been more positive.  She was able to drive several places by herself.  She did have an incident where she noticed something was wrong with her foot and had to go to urgent care and into the emergency room being there all night on Friday before Mother's Day.  She was able to handle that well and drive home by herself even after being up all night.  There was no clear diagnosis but he is getting better.  She did helping her daughter get ready for her grandson's first birthday.  She feels that her meds are in a good place and her anxiety is getting better as well as physically she is feeling better from multiple medical issues earlier this year.  There was only 1 situation with her husband where he did not respect the boundaries that they have agreed upon.  She said that she did raise her voice a little bit  and set very clear verbal and physical boundary but that she did not get angry as she had in the past and she knows that is progress.  Her daughter even said that she was proud of her.  We talked about mindfulness and how it is contributing to her better managing her emotions and the continued practice of that.  She does contract for safety having no thoughts of hurting herself or anyone else. Interventions: Cognitive Behavioral Therapy  Diagnosis: Adjustment disorder with mixed anxiety and depressed mood.  Plan: I will meet with the patient every 2 weeks via video session Tx. Plan:To use cognitive behavioral therapy principles as well as elements of dialectical behavior therapy.  Goals are to reduce anxiety and depression by at least 50% with a target date of November 06, 2022.  Goals are to have less sadness as indicated by PHQ-9 scores as well as patient report.  We also work on improving mood and return to a healthier level of functioning as defined by her goals for being happy, identify causes and process triggers for depressed mood.  We will use cognitive behavioral therapy to explore and replace unhealthy thoughts and behavior patterns contributing to depression.  I will continue to encourage shearing of feelings related to the causes and symptoms of depression as well as teach and encouraged use of coping skills for management of depressive symptoms.  We also will work to improve the patient's ability to manage anxiety symptoms  and better handle stress, identify causes for anxiety and explore ways for reduction of anxiety in addition to resolving conflicts contributing to anxiety and managing thoughts and worrisome thinking is contributing to anxiety.  Interventions will include providing education about anxiety, facilitate problem solution skills, teaching coping skills for managing anxiety such as grounding exercises, progressive muscle relaxation and cognitive reframing etc.  We will also use  cognitive behavioral therapy to identify and change anxiety provoking thoughts and behavior patterns as well as using DBT distress tolerance and mindfulness skills. [name], [facility]                 [name], [facility]               [name], [facility]

## 2022-05-26 ENCOUNTER — Ambulatory Visit (INDEPENDENT_AMBULATORY_CARE_PROVIDER_SITE_OTHER): Payer: Medicare Other

## 2022-05-26 DIAGNOSIS — I480 Paroxysmal atrial fibrillation: Secondary | ICD-10-CM

## 2022-05-26 LAB — CUP PACEART REMOTE DEVICE CHECK
Date Time Interrogation Session: 20240517230147
Implantable Pulse Generator Implant Date: 20210301

## 2022-05-29 NOTE — Progress Notes (Signed)
Carelink Summary Report / Loop Recorder 

## 2022-05-30 ENCOUNTER — Ambulatory Visit: Payer: PPO | Admitting: Behavioral Health

## 2022-06-06 ENCOUNTER — Ambulatory Visit (INDEPENDENT_AMBULATORY_CARE_PROVIDER_SITE_OTHER): Payer: Medicare Other | Admitting: Behavioral Health

## 2022-06-06 ENCOUNTER — Encounter: Payer: Self-pay | Admitting: Behavioral Health

## 2022-06-06 DIAGNOSIS — F411 Generalized anxiety disorder: Secondary | ICD-10-CM | POA: Diagnosis not present

## 2022-06-06 NOTE — Progress Notes (Signed)
[facility] Counselor/Therapist Progress Note  Patient ID: Adessa Cavness, MRN: [id number],    Date: 06/06/2022  Time Spent: 53 minutes spent via video session.  The patient was at home and this therapist was in his home office.  Treatment Type: Individual Therapy  Reported Symptoms: Anxiety, depression  Mental Status Exam: Appearance:  Well Groomed     Behavior: Appropriate  Motor: Normal  Speech/Language:  Clear and Coherent  Affect: Appropriate  Mood: normal  Thought process: normal  Thought content:   WNL  Sensory/Perceptual disturbances:   WNL  Orientation: oriented to person, place, time/date, situation, day of week, month of year, and year  Attention: Good  Concentration: Good  Memory: WNL  Fund of knowledge:  Good  Insight:   Good  Judgment:  Good  Impulse Control: Good   Risk Assessment: Danger to Self:  No Self-injurious Behavior: No Danger to Others: No Duty to Warn:no Physical Aggression / Violence:No  Access to Firearms a concern: No  Gang Involvement:No   Subjective: The patient's foot is feeling better although there is still some discomfort.  She sees that it is getting better.  Her grandson's first birthday party went well for the most part but she was exhausted for days afterwards.  Her husband's sister and brother-in-law were there and they were not aware of what is going on with the patient's life and she had to explain a little bit to them because he had not told them the truth.  She is currently composing an email to them explaining in more detail what has been going on.  I encouraged her to sit on that email going back to it reviewing it even letting her daughter read it before sending it.  She feels letting them know the truth will be the end of those people who she needs to let the truth no even if they do not receive it as well as she would like for them to.  She continues to make progress in managing her emotions and setting  healthy boundaries in her relationship.  She knows that it is time to start doing those things for herself.  We did talk about some of the feelings associated with not having made some changes earlier but she knows that she understands why she did.  We talked about visiting a new church, going to visit sisters and friends this upcoming weekend as well as doing some other things which she was not able to do over the past several years or wanted to do. She does contract for safety having no thoughts of hurting herself or anyone else. Interventions: Cognitive Behavioral Therapy  Diagnosis: Adjustment disorder with mixed anxiety and depressed mood.  Plan: I will meet with the patient every 2 weeks via video session Tx. Plan:To use cognitive behavioral therapy principles as well as elements of dialectical behavior therapy.  Goals are to reduce anxiety and depression by at least 50% with a target date of November 06, 2022.  Goals are to have less sadness as indicated by PHQ-9 scores as well as patient report.  We also work on improving mood and return to a healthier level of functioning as defined by her goals for being happy, identify causes and process triggers for depressed mood.  We will use cognitive behavioral therapy to explore and replace unhealthy thoughts and behavior patterns contributing to depression.  I will continue to encourage shearing of feelings related to the causes and symptoms of depression as well as teach  and encouraged use of coping skills for management of depressive symptoms.  We also will work to improve the patient's ability to manage anxiety symptoms and better handle stress, identify causes for anxiety and explore ways for reduction of anxiety in addition to resolving conflicts contributing to anxiety and managing thoughts and worrisome thinking is contributing to anxiety.  Interventions will include providing education about anxiety, facilitate problem solution skills, teaching coping  skills for managing anxiety such as grounding exercises, progressive muscle relaxation and cognitive reframing etc.  We will also use cognitive behavioral therapy to identify and change anxiety provoking thoughts and behavior patterns as well as using DBT distress tolerance and mindfulness skills. [name], [facility]                 [name], [facility]               [name], [facility]               [name], [facility]

## 2022-06-12 ENCOUNTER — Ambulatory Visit (INDEPENDENT_AMBULATORY_CARE_PROVIDER_SITE_OTHER): Payer: Medicare Other

## 2022-06-12 VITALS — Ht 67.0 in | Wt 206.7 lb

## 2022-06-12 DIAGNOSIS — Z0000 Encounter for general adult medical examination without abnormal findings: Secondary | ICD-10-CM

## 2022-06-12 NOTE — Patient Instructions (Addendum)
[name] , Thank you for taking time to come for your Medicare Wellness Visit. I appreciate your ongoing commitment to your health goals. Please review the following plan we discussed and let me know if I can assist you in the future.   These are the goals we discussed:  Goals      Patient Stated     11/04/2021, wants to lose weight     Patient Stated     Lose weight and exercise more.     PharmD Care Plan     CARE PLAN ENTRY  Current Barriers:  Chronic Disease Management support, education, and care coordination needs related to Hypertension, Atrial Fibrillation, Depression, and Anxiety   Hypertension Pharmacist Clinical Goal(s): Over the next 90 days, patient will work with PharmD and providers to maintain BP goal <140/90 Current regimen:  Amlodipine 10 mg tablet - half tablet once daily  Interventions: Home bp management Patient self care activities - Over the next 90 days, patient will: Check BP at least once every 1-2 weeks, document, and provide at future appointments Ensure daily salt intake < 2300 mg/day AFib Pharmacist Clinical Goal(s) Over the next 90 days, patient will work with PharmD and providers to minimize symptoms of Afib Current regimen:  Xarelto 20 mg once daily Carvedilol 6.25 mg twice daily Flecainide 50 mg tablet - two tablets daily as needed for breakthrough afib Interventions: Continue current management Patient self care activities - Over the next 90 days, patient will: Continue current management  Anxiety/depression Pharmacist Clinical Goal(s) Over the next 90 days, patient will work with PharmD and providers to minimize symptoms of anxiety/depression Current regimen:  Lorazepam 0.5 mg twice daily as needed Duloxetine 30 mg once daily  Interventions: Continue current management Patient self care activities - Over the next 90 days, patient will: Continue current management  Medication management Pharmacist Clinical Goal(s): Over the next  90 days, patient will work with PharmD and providers to maintain optimal medication adherence Current pharmacy: [facility] Interventions Comprehensive medication review performed. Continue current medication management strategy Patient self care activities - Over the next 90 days, patient will: Focus on medication adherence by continuing current management Take medications as prescribed Report any questions or concerns to PharmD and/or provider(s) Initial goal documentation.        This is a list of the screening recommended for you and due dates:  Health Maintenance  Topic Date Due   Hepatitis C Screening  Never done   Screening for Lung Cancer  Never done   Colon Cancer Screening  02/23/2021   COVID-19 Vaccine (4 - 2023-24 season) 09/06/2021   Zoster (Shingles) Vaccine (1 of 2) 10/31/2024*   Flu Shot  08/07/2022   Medicare Annual Wellness Visit  06/12/2023   Mammogram  12/03/2023   DTaP/Tdap/Td vaccine (3 - Td or Tdap) 05/18/2031   Pneumonia Vaccine  Completed   DEXA scan (bone density measurement)  Completed   HPV Vaccine  Aged Out   Cologuard (Stool DNA test)  Discontinued  *Topic was postponed. The date shown is not the original due date.    Advanced directives: Please bring a copy of your health care power of attorney and living will to the office to be added to your chart at your convenience.   Conditions/risks identified: Aim for 30 minutes of exercise or brisk walking, 6-8 glasses of water, and 5 servings of fruits and vegetables each day.   Next appointment: Follow up in one year for your annual wellness visit  06/16/22 @ 11:15 televisit   Preventive Care 65 Years and Older, Female Preventive care refers to lifestyle choices and visits with your health care provider that can promote health and wellness. What does preventive care include? A yearly physical exam. This is also called an annual well check. Dental exams once or twice a year. Routine eye exams. Ask your  health care provider how often you should have your eyes checked. Personal lifestyle choices, including: Daily care of your teeth and gums. Regular physical activity. Eating a healthy diet. Avoiding tobacco and drug use. Limiting alcohol use. Practicing safe sex. Taking low-dose aspirin every day. Taking vitamin and mineral supplements as recommended by your health care provider. What happens during an annual well check? The services and screenings done by your health care provider during your annual well check will depend on your age, overall health, lifestyle risk factors, and family history of disease. Counseling  Your health care provider may ask you questions about your: Alcohol use. Tobacco use. Drug use. Emotional well-being. Home and relationship well-being. Sexual activity. Eating habits. History of falls. Memory and ability to understand (cognition). Work and work Astronomer. Reproductive health. Screening  You may have the following tests or measurements: Height, weight, and BMI. Blood pressure. Lipid and cholesterol levels. These may be checked every 5 years, or more frequently if you are over 100 years old. Skin check. Lung cancer screening. You may have this screening every year starting at age 39 if you have a 30-pack-year history of smoking and currently smoke or have quit within the past 15 years. Fecal occult blood test (FOBT) of the stool. You may have this test every year starting at age 48. Flexible sigmoidoscopy or colonoscopy. You may have a sigmoidoscopy every 5 years or a colonoscopy every 10 years starting at age 21. Hepatitis C blood test. Hepatitis B blood test. Sexually transmitted disease (STD) testing. Diabetes screening. This is done by checking your blood sugar (glucose) after you have not eaten for a while (fasting). You may have this done every 1-3 years. Bone density scan. This is done to screen for osteoporosis. You may have this done  starting at age 20. Mammogram. This may be done every 1-2 years. Talk to your health care provider about how often you should have regular mammograms. Talk with your health care provider about your test results, treatment options, and if necessary, the need for more tests. Vaccines  Your health care provider may recommend certain vaccines, such as: Influenza vaccine. This is recommended every year. Tetanus, diphtheria, and acellular pertussis (Tdap, Td) vaccine. You may need a Td booster every 10 years. Zoster vaccine. You may need this after age 45. Pneumococcal 13-valent conjugate (PCV13) vaccine. One dose is recommended after age 15. Pneumococcal polysaccharide (PPSV23) vaccine. One dose is recommended after age 65. Talk to your health care provider about which screenings and vaccines you need and how often you need them. This information is not intended to replace advice given to you by your health care provider. Make sure you discuss any questions you have with your health care provider. Document Released: 01/19/2015 Document Revised: 09/12/2015 Document Reviewed: 10/24/2014 Elsevier Interactive Patient Education  2017 [employer].  Fall Prevention in the Home Falls can cause injuries. They can happen to people of all ages. There are many things you can do to make your home safe and to help prevent falls. What can I do on the outside of my home? Regularly fix the edges of walkways and  driveways and fix any cracks. Remove anything that might make you trip as you walk through a door, such as a raised step or threshold. Trim any bushes or trees on the path to your home. Use bright outdoor lighting. Clear any walking paths of anything that might make someone trip, such as rocks or tools. Regularly check to see if handrails are loose or broken. Make sure that both sides of any steps have handrails. Any raised decks and porches should have guardrails on the edges. Have any leaves, snow, or  ice cleared regularly. Use sand or salt on walking paths during winter. Clean up any spills in your garage right away. This includes oil or grease spills. What can I do in the bathroom? Use night lights. Install grab bars by the toilet and in the tub and shower. Do not use towel bars as grab bars. Use non-skid mats or decals in the tub or shower. If you need to sit down in the shower, use a plastic, non-slip stool. Keep the floor dry. Clean up any water that spills on the floor as soon as it happens. Remove soap buildup in the tub or shower regularly. Attach bath mats securely with double-sided non-slip rug Shannon [name]. Do not have throw rugs and other things on the floor that can make you trip. What can I do in the bedroom? Use night lights. Make sure that you have a light by your bed that is easy to reach. Do not use any sheets or blankets that are too big for your bed. They should not hang down onto the floor. Have a firm chair that has side arms. You can use this for support while you get dressed. Do not have throw rugs and other things on the floor that can make you trip. What can I do in the kitchen? Clean up any spills right away. Avoid walking on wet floors. Keep items that you use a lot in easy-to-reach places. If you need to reach something above you, use a strong step stool that has a grab bar. Keep electrical cords out of the way. Do not use floor polish or wax that makes floors slippery. If you must use wax, use non-skid floor wax. Do not have throw rugs and other things on the floor that can make you trip. What can I do with my stairs? Do not leave any items on the stairs. Make sure that there are handrails on both sides of the stairs and use them. Fix handrails that are broken or loose. Make sure that handrails are as long as the stairways. Check any carpeting to make sure that it is firmly attached to the stairs. Fix any carpet that is loose or worn. Avoid having throw rugs at  the top or bottom of the stairs. If you do have throw rugs, attach them to the floor with carpet Shannon [name]. Make sure that you have a light switch at the top of the stairs and the bottom of the stairs. If you do not have them, ask someone to add them for you. What else can I do to help prevent falls? Wear shoes that: Do not have high heels. Have rubber bottoms. Are comfortable and fit you well. Are closed at the toe. Do not wear sandals. If you use a stepladder: Make sure that it is fully opened. Do not climb a closed stepladder. Make sure that both sides of the stepladder are locked into place. Ask someone to hold it for you, if possible. Clearly  mark and make sure that you can see: Any grab bars or handrails. First and last steps. Where the edge of each step is. Use tools that help you move around (mobility aids) if they are needed. These include: Canes. Walkers. Scooters. Crutches. Turn on the lights when you go into a dark area. Replace any light bulbs as soon as they burn out. Set up your furniture so you have a clear path. Avoid moving your furniture around. If any of your floors are uneven, fix them. If there are any pets around you, be aware of where they are. Review your medicines with your doctor. Some medicines can make you feel dizzy. This can increase your chance of falling. Ask your doctor what other things that you can do to help prevent falls. This information is not intended to replace advice given to you by your health care provider. Make sure you discuss any questions you have with your health care provider. Document Released: 10/19/2008 Document Revised: 05/31/2015 Document Reviewed: 01/27/2014 Elsevier Interactive Patient Education  2017 [employer].
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
## 2022-06-17 ENCOUNTER — Telehealth: Payer: Self-pay | Admitting: Nurse Practitioner

## 2022-06-17 DIAGNOSIS — I639 Cerebral infarction, unspecified: Secondary | ICD-10-CM

## 2022-06-17 NOTE — Telephone Encounter (Signed)
Prescription Request  06/17/2022  LOV: 05/19/2022  What is the name of the medication or equipment?  apixaban (ELIQUIS) 5 MG TABS tablet  Have you contacted your pharmacy to request a refill? Yes   Which pharmacy would you like this sent to?   [facility] - [place], [place] - [address] [place] [address] Phone: [phone] Fax: [phone]    Patient notified that their request is being sent to the clinical staff for review and that they should receive a response within 2 business days.   Please advise at [facility] [phone]  Patient is now using [facility] and they are requiring a new script to be sent in to them.

## 2022-06-18 MED ORDER — APIXABAN 5 MG PO TABS
5.0000 mg | ORAL_TABLET | Freq: Two times a day (BID) | ORAL | 3 refills | Status: DC
Start: 2022-06-18 — End: 2022-12-12

## 2022-06-18 NOTE — Telephone Encounter (Signed)
Medication refill sent in. 

## 2022-06-18 NOTE — Telephone Encounter (Signed)
Informed pt that rx was sent in

## 2022-06-20 ENCOUNTER — Encounter: Payer: Self-pay | Admitting: Behavioral Health

## 2022-06-20 ENCOUNTER — Ambulatory Visit (INDEPENDENT_AMBULATORY_CARE_PROVIDER_SITE_OTHER): Payer: Medicare Other | Admitting: Behavioral Health

## 2022-06-20 DIAGNOSIS — G4733 Obstructive sleep apnea (adult) (pediatric): Secondary | ICD-10-CM | POA: Diagnosis not present

## 2022-06-20 DIAGNOSIS — I10 Essential (primary) hypertension: Secondary | ICD-10-CM | POA: Diagnosis not present

## 2022-06-20 DIAGNOSIS — F411 Generalized anxiety disorder: Secondary | ICD-10-CM | POA: Diagnosis not present

## 2022-06-20 NOTE — Progress Notes (Signed)
[facility] Counselor/Therapist Progress Note  Patient ID: Shannon [name], MRN: [id number],    Date: 06/20/2022  Time Spent: 53 minutes spent via video session, from 3:00 PM to 3:53 PM..  The patient was at home and this therapist was in his home office.  The patient is aware of the limitations of telehealth video sessions.  Treatment Type: Individual Therapy  Reported Symptoms: Anxiety, depression  Mental Status Exam: Appearance:  Well Groomed     Behavior: Appropriate  Motor: Normal  Speech/Language:  Clear and Coherent  Affect: Appropriate  Mood: normal  Thought process: normal  Thought content:   WNL  Sensory/Perceptual disturbances:   WNL  Orientation: oriented to person, place, time/date, situation, day of week, month of year, and year  Attention: Good  Concentration: Good  Memory: WNL  Fund of knowledge:  Good  Insight:   Good  Judgment:  Good  Impulse Control: Good   Risk Assessment: Danger to Self:  No Self-injurious Behavior: No Danger to Others: No Duty to Warn:no Physical Aggression / Violence:No  Access to Firearms a concern: No  Gang Involvement:No   Subjective: There is still some physical discomfort with the patient but her toe was getting better.  She had a couple other small things happen physically but they are feeling better.  There are still some significant back pain which limits how far she can walk how long she can walk saying she gets winded easily and it is painful.  She knows she is in somewhat of still recovering from helping set up for her grandson's first birthday.  The patient said she had a very difficult but honest conversation with her husband because of some ins of the agreement that he was not holding up and he at least in the past week has been responsive to that and is making an effort to make changes.  We talked about the difference between listening to her head and to her heart and especially as her faith plays  into that in terms of communication and how she interacts with her husband.  We also talked about the importance of mindfulness to monitor her mood when there is something that frustrates her.  I encouraged continued use of coping skills for frustration and irritability and anxiety which she is using with benefit.  She does contract for safety having no thoughts of hurting herself or anyone else. Interventions: Cognitive Behavioral Therapy  Diagnosis: Adjustment disorder with mixed anxiety and depressed mood.  Plan: I will meet with the patient every 2 weeks via video session Tx. Plan:To use cognitive behavioral therapy principles as well as elements of dialectical behavior therapy.  Goals are to reduce anxiety and depression by at least 50% with a target date of November 06, 2022.  Goals are to have less sadness as indicated by PHQ-9 scores as well as patient report.  We also work on improving mood and return to a healthier level of functioning as defined by her goals for being happy, identify causes and process triggers for depressed mood.  We will use cognitive behavioral therapy to explore and replace unhealthy thoughts and behavior patterns contributing to depression.  I will continue to encourage shearing of feelings related to the causes and symptoms of depression as well as teach and encouraged use of coping skills for management of depressive symptoms.  We also will work to improve the patient's ability to manage anxiety symptoms and better handle stress, identify causes for anxiety and explore ways for  reduction of anxiety in addition to resolving conflicts contributing to anxiety and managing thoughts and worrisome thinking is contributing to anxiety.  Interventions will include providing education about anxiety, facilitate problem solution skills, teaching coping skills for managing anxiety such as grounding exercises, progressive muscle relaxation and cognitive reframing etc.  We will also use  cognitive behavioral therapy to identify and change anxiety provoking thoughts and behavior patterns as well as using DBT distress tolerance and mindfulness skills.  Progress: 35% Shannon [name], [facility]                 Shannon [name], [facility]               Shannon [name], [facility]               Shannon [name], [facility]               Shannon [name], [facility]

## 2022-06-24 NOTE — Progress Notes (Signed)
Carelink Summary Report / Loop Recorder 

## 2022-06-26 ENCOUNTER — Ambulatory Visit: Payer: Medicare Other

## 2022-06-26 DIAGNOSIS — I5032 Chronic diastolic (congestive) heart failure: Secondary | ICD-10-CM | POA: Diagnosis not present

## 2022-06-26 LAB — CUP PACEART REMOTE DEVICE CHECK
Date Time Interrogation Session: 20240619230927
Implantable Pulse Generator Implant Date: 20210301

## 2022-07-04 ENCOUNTER — Encounter: Payer: Self-pay | Admitting: Behavioral Health

## 2022-07-04 ENCOUNTER — Ambulatory Visit (INDEPENDENT_AMBULATORY_CARE_PROVIDER_SITE_OTHER): Payer: Medicare Other | Admitting: Behavioral Health

## 2022-07-04 DIAGNOSIS — F411 Generalized anxiety disorder: Secondary | ICD-10-CM | POA: Diagnosis not present
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
## 2022-07-14 ENCOUNTER — Other Ambulatory Visit (HOSPITAL_COMMUNITY): Payer: Self-pay | Admitting: *Deleted

## 2022-07-14 MED ORDER — AMLODIPINE BESYLATE 5 MG PO TABS: 5.0000 mg | ORAL_TABLET | Freq: Every day | ORAL | 2 refills | Status: DC

## 2022-07-16 NOTE — Progress Notes (Signed)
Carelink Summary Report / Loop Recorder 

## 2022-07-24 ENCOUNTER — Telehealth (HOSPITAL_COMMUNITY): Payer: Self-pay | Admitting: *Deleted

## 2022-07-24 NOTE — Telephone Encounter (Signed)
Patient called in stating she went into AFib yesterday and continues today. She feels it is related to anxiety/stress in her life but she usually doesn't stay in afib this long. She has taken an extra dose of coreg yesterday but none today. Instructed pt per Dr Lalla Brothers last note use extra coreg for breakthrough afib. Pt off amiodarone now for about 3-4 months as well. Pt will hold amlodipine while taking extra coreg to allow for BP room and will call if still in afib next week. Pt in agreement.

## 2022-07-25 ENCOUNTER — Ambulatory Visit (INDEPENDENT_AMBULATORY_CARE_PROVIDER_SITE_OTHER): Payer: Medicare Other | Admitting: Behavioral Health

## 2022-07-25 DIAGNOSIS — F4323 Adjustment disorder with mixed anxiety and depressed mood: Secondary | ICD-10-CM

## 2022-07-25 DIAGNOSIS — F411 Generalized anxiety disorder: Secondary | ICD-10-CM

## 2022-07-25 DIAGNOSIS — F331 Major depressive disorder, recurrent, moderate: Secondary | ICD-10-CM
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
## 2022-07-28 ENCOUNTER — Ambulatory Visit: Payer: Medicare Other

## 2022-07-28 DIAGNOSIS — I480 Paroxysmal atrial fibrillation: Secondary | ICD-10-CM | POA: Diagnosis not present

## 2022-07-30 LAB — CUP PACEART REMOTE DEVICE CHECK
Date Time Interrogation Session: 20240722230317
Implantable Pulse Generator Implant Date: 20210301

## 2022-08-07 DIAGNOSIS — I10 Essential (primary) hypertension: Secondary | ICD-10-CM | POA: Diagnosis not present

## 2022-08-07 DIAGNOSIS — G4733 Obstructive sleep apnea (adult) (pediatric): Secondary | ICD-10-CM | POA: Diagnosis not present

## 2022-08-08 ENCOUNTER — Encounter: Payer: Self-pay | Admitting: Behavioral Health

## 2022-08-08 ENCOUNTER — Ambulatory Visit (INDEPENDENT_AMBULATORY_CARE_PROVIDER_SITE_OTHER): Payer: Medicare Other | Admitting: Behavioral Health

## 2022-08-08 DIAGNOSIS — F4323 Adjustment disorder with mixed anxiety and depressed mood: Secondary | ICD-10-CM

## 2022-08-08 DIAGNOSIS — F411 Generalized anxiety disorder: Secondary | ICD-10-CM
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
## 2022-08-12 ENCOUNTER — Other Ambulatory Visit: Payer: Self-pay | Admitting: Nurse Practitioner

## 2022-08-12 DIAGNOSIS — F411 Generalized anxiety disorder: Secondary | ICD-10-CM

## 2022-08-12 NOTE — Telephone Encounter (Signed)
Okay to be filled?    Prescription error. States "Authorizing provider has not been selected"  LAST APPOINTMENT DATE: 05/19/22   NEXT APPOINTMENT DATE: 08/14/2022   BUSPAR 5mg   LAST REFILL: 05/14/22  QTY: #60 2RF

## 2022-08-14 ENCOUNTER — Encounter: Payer: Medicare Other | Admitting: Nurse Practitioner

## 2022-08-15 NOTE — Progress Notes (Signed)
Carelink Summary Report / Loop Recorder 

## 2022-08-20 ENCOUNTER — Ambulatory Visit (INDEPENDENT_AMBULATORY_CARE_PROVIDER_SITE_OTHER): Payer: Medicare Other | Admitting: Nurse Practitioner

## 2022-08-20 ENCOUNTER — Telehealth: Payer: Self-pay | Admitting: Nurse Practitioner

## 2022-08-20 ENCOUNTER — Ambulatory Visit (HOSPITAL_COMMUNITY)
Admission: EM | Admit: 2022-08-20 | Discharge: 2022-08-20 | Disposition: A | Discharge status: Home / Self Care | Payer: Medicare Other | Attending: Psychiatry | Admitting: Psychiatry

## 2022-08-20 ENCOUNTER — Encounter: Payer: Self-pay | Admitting: Nurse Practitioner

## 2022-08-20 VITALS — BP 140/90 | HR 69 | Temp 97.8°F | Ht 67.0 in | Wt 203.6 lb

## 2022-08-20 DIAGNOSIS — F32A Depression, unspecified: Secondary | ICD-10-CM | POA: Diagnosis not present

## 2022-08-20 DIAGNOSIS — F411 Generalized anxiety disorder: Secondary | ICD-10-CM | POA: Diagnosis present

## 2022-08-20 DIAGNOSIS — F4323 Adjustment disorder with mixed anxiety and depressed mood: Secondary | ICD-10-CM | POA: Diagnosis not present

## 2022-08-20 DIAGNOSIS — Z556 Problems related to health literacy: Secondary | ICD-10-CM | POA: Insufficient documentation
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
## 2022-08-20 NOTE — Discharge Instructions (Addendum)

## 2022-08-20 NOTE — ED Provider Notes (Signed)
Behavioral Health Urgent Care Medical Screening Exam  Patient Name: Shannon [name] MRN: [id number] Date of Evaluation: 08/20/22 Chief Complaint:   Diagnosis:  Final diagnoses:  Anxiety state    History of Present illness: Shannon [name] is a 72 y.o. female.   Patient presents voluntarily complaining of increased depression and anxiety. She reports that she is experiencing multiple stressors at home including financial struggles, adjustment to new housing, along with preexisting mental health problems.  Patient presents with a hx of depression, anxiety with prescribed medication regimen. She has been on Cymbalta, Prozac, Zoloft, Ativan, Buspar along with therapy which she is supposed to receive every other week. She reports that she missed her last appointment due to inclement weather and was rescheduled for tomorrow. Patient saw a PCP today complaining of worsening anxiety and depression related to family dysfunction. She reports that her main stressor is her husband who has been lying to her for so many years, ensuring her that he was managing well the family finances until she found out that they were falling into bankruptcy.  Patient reports that they have lost everything including the little house that was left and had to move in with their daughter. She reports that "I can't stand my husband anymore, he is selfish, he has addiction to cars and has been spending all the money on cars. He took all my inheritances to spend on cars, I trusted him until I found that he used my ID and social security card to get credit cards, and now we have so many debts, he has addictive personality".  Patient reports that her and her husband had to move into their daughter's house. Daughter has Bipolar and "very sloppy, so disorganized". Patient has to take care of her daughter's child while she is working. She reports that she is feeling overwhelmed from childcare  and cleaning the house, cooking and  "having to deal with personalities".  Patient reports that she does not have any other choice due to financial difficulties that she is facing.  She reports that she takes all her prescribed medications except Ativan "because it makes me angry". Patient admits that the symptoms increased after she missed her appointment with Tasia Catchings. She denies SI/HI/AVH but expresses anger and agitations.  Upon assessment: Patient is a 72 year-old female who appears anxious and upset. She is appropriately dressed and groomed. Alert and oriented x 4. Her thought process is clear and organized and goal directed. She does not appear to be preoccupied  or responding to internal stimuli. She has good eye contact. She is tearful at times, reporting that "my husband ruined me". Patient denies SI/HI/AVH. She expresses anger and disappointment toward her husband. She also reports feeling overwhelmed secondary to having to take care of her grandchild on a daily basis, in addition to her health issues. Patient reports hx of multiple health issues including HTN, sleep apnea, CHF, A-fib, CVA, etc. She presents with no acute medical concerns and reports that her medical problems are stabilized.  She reports that her appetite has been decreasing due to family dysfunction.    Patient reports that it is important for her to see her therapist tomorrow. She reports that she wanted to come and talk to someone to feel better. Patient is encouraged to take her bedtime medications tonight including her anxiety medications. She will report to her appointment tomorrow as scheduled. Additional  supportive resources provided. Patient reports  she stays in a different room and she will go home,  take her medications and go to her room to avoid  additional stressful events. She contracts for safety reporting "I will not do anything to myself, I will just stay away from him".   BP rechecked: 140/85 upon discharge.      Flowsheet Row ED from 08/20/2022 in  [facility] ED from 03/08/2022 in [facility] Emergency Department at [facility] ED from 02/28/2022 in [facility] Emergency Department at [facility]  C-SSRS RISK CATEGORY High Risk No Risk No Risk       Psychiatric Specialty Exam  Presentation  General Appearance:Appropriate for Environment  Eye Contact:Good  Speech:Normal Rate; Clear and Coherent  Speech Volume:Normal  Handedness:Right   Mood and Affect  Mood: Anxious  Affect: Appropriate; Congruent   Thought Process  Thought Processes: Coherent; Goal Directed  Descriptions of Associations:Intact  Orientation:Full (Time, Place and Person)  Thought Content:Logical  Diagnosis of Schizophrenia or Schizoaffective disorder in past: No   Hallucinations:None  Ideas of Reference:None  Suicidal Thoughts:No  Homicidal Thoughts:No   Sensorium  Memory: Immediate Good; Recent Good; Remote Good  Judgment: Fair  Insight: Present   Executive Functions  Concentration: Fair  Attention Span: Fair  Recall: [employer] of Knowledge: Fair  Language: Fair   Psychomotor Activity  Psychomotor Activity: Normal   Assets  Assets: Manufacturing systems engineer; Desire for Improvement; Housing; Transportation   Sleep  Sleep: Good  Number of hours:  7   Physical Exam: Physical Exam Vitals and nursing note reviewed.  Constitutional:      Appearance: Normal appearance.  HENT:     Head: Normocephalic and atraumatic.     Right Ear: Tympanic membrane normal.     Left Ear: Tympanic membrane normal.     Nose: Nose normal.  Eyes:     Extraocular Movements: Extraocular movements intact.     Pupils: Pupils are equal, round, and reactive to light.  Cardiovascular:     Rate and Rhythm: Normal rate and regular rhythm.     Pulses: Normal pulses.  Pulmonary:     Effort: Pulmonary effort is normal.  Musculoskeletal:        General: Normal range of motion.     Cervical  back: Normal range of motion and neck supple.  Skin:    General: Skin is warm.  Neurological:     General: No focal deficit present.     Mental Status: She is alert and oriented to person, place, and time.  Psychiatric:        Thought Content: Thought content normal.        Judgment: Judgment normal.    Review of Systems  Constitutional: Negative.   HENT: Negative.    Eyes: Negative.   Respiratory: Negative.    Cardiovascular: Negative.   Gastrointestinal: Negative.   Genitourinary: Negative.   Musculoskeletal: Negative.   Skin: Negative.   Neurological: Negative.   Endo/Heme/Allergies: Negative.   Psychiatric/Behavioral:  Positive for depression. The patient is nervous/anxious.    Blood pressure (!) 173/82, pulse 76, temperature 98.6 F (37 C), temperature source Oral, resp. rate 17, SpO2 98%. There is no height or weight on file to calculate BMI.  Musculoskeletal: Strength & Muscle Tone: within normal limits Gait & Station: normal Patient leans: N/A   [facility] MSE Discharge Disposition for Follow up and Recommendations: Based on my evaluation the patient does not appear to have an emergency medical condition and can be discharged with resources and follow up care in outpatient services for Medication  Management, Individual Therapy, and Group Therapy   Olin Pia, NP 08/20/2022, 6:25 PM

## 2022-08-20 NOTE — Progress Notes (Signed)
   08/20/22 1658  [facility] Triage Screening (Walk-ins at [facility] only)  How Did You Hear About [place]? Self  What Is the Reason for Your Visit/Call Today? Pt arrived to [facility] voluntarily unaccompanied by anyone. Pt states that she is very, very depressed. Pt states that she just can't take it anymore. Pt states that just the other day she thought about driving off a cliff. Pt states that most of her issues stem from her husband. Pt states that she was instructed to come this facility by her doctor. Pt states that she know her depression is affecting her because her heart is racing and she has A-Fib. Pt states that she really doesn't want to be here but she knows she needs to talk to someone. Pt states that she use to talk to her siblings but can no longer do that because it is causing problems for her sister's health & the brother just had a heartache. Pt states she was recently here in Feb. Pt states that she does have a therapist, with an appointment tomorrow. Pt endorses SI this week, thinking about what would happen if she took too much medication; as well as, if she didn't take any. Pt denies HI and AVH at this current time. Pt denies the use of alcohol and drugs at this current time. Pt uses a CPap machine to sleep.  How Long Has This Been Causing You Problems? > than 6 months  Have You Recently Had Any Thoughts About Hurting Yourself? Yes  How long ago did you have thoughts about hurting yourself? this week  Are You Planning to Commit Suicide/Harm Yourself At This time? No  Have you Recently Had Thoughts About Hurting Someone Karolee Ohs? No  Are You Planning To Harm Someone At This Time? No  Are you currently experiencing any auditory, visual or other hallucinations? No  Have You Used Any Alcohol or Drugs in the Past 24 Hours? No  Do you have any current medical co-morbidities that require immediate attention? No  Clinician description of patient physical appearance/behavior: neatly dressed, tearful and  cooperative  What Do You Feel Would Help You the Most Today? Treatment for Depression or other mood problem;Medication(s)  If access to [facility] was not available, would you have sought care in the Emergency Department? Yes  Determination of Need Urgent (48 hours)  Options For Referral Inpatient Hospitalization;Medication Management

## 2022-08-20 NOTE — ED Notes (Signed)
Pt discharged with  AVS.  AVS reviewed prior to discharge.  Pt alert, oriented, and ambulatory.  Safety maintained.  °

## 2022-08-20 NOTE — Assessment & Plan Note (Signed)
Patient is having feelings of being overwhelmed and "not wanting to be here any more" Passive SI currently. Has made comment about running the car off the road. Dicussed with patient that she needs a formal BH evaluation and that she should go to the [facility] in [place]. Patient did contract to safety with me. She does have kids and grandchildren to live for. She has been on antidepressants in the past. Patient was informed that if she does not go and get evaluated that the sheriffs office will be alerted to make sure that she gets evaluated

## 2022-08-20 NOTE — Telephone Encounter (Signed)
Called and gave verbal report to Dennison Mascot, RN at [facility] at [phone]

## 2022-08-20 NOTE — Patient Instructions (Signed)
You need to go to the behavioral health urgent care  Address: [address] [place], [place] [address] Hours:  Open 24 hours Phone: [phone]  I will call and make sure that you made it

## 2022-08-21 ENCOUNTER — Ambulatory Visit (INDEPENDENT_AMBULATORY_CARE_PROVIDER_SITE_OTHER): Payer: Medicare Other | Admitting: Behavioral Health

## 2022-08-21 ENCOUNTER — Encounter: Payer: Self-pay | Admitting: Behavioral Health

## 2022-08-21 DIAGNOSIS — F331 Major depressive disorder, recurrent, moderate: Secondary | ICD-10-CM

## 2022-08-21 DIAGNOSIS — F4323 Adjustment disorder with mixed anxiety and depressed mood: Secondary | ICD-10-CM

## 2022-08-21 DIAGNOSIS — F411 Generalized anxiety disorder: Secondary | ICD-10-CM

## 2022-08-21 NOTE — Progress Notes (Signed)
[facility] Counselor/Therapist Progress Note  Patient ID: Shannon [name], MRN: [id number],    Date: 08/21/2022  Time Spent: 58 minutes, 2 PM until 2:58 PM this session was held via video teletherapy. The patient consented to the video teletherapy and was located in her home during this session. She is aware it is the responsibility of the patient to secure confidentiality on her end of the session. The provider was in a private home office for the duration of this session.     Reported Symptoms: Anxiety, depression  Mental Status Exam: Appearance:  Well Groomed     Behavior: Appropriate  Motor: Normal  Speech/Language:  Clear and Coherent  Affect: Appropriate for  Mood: normal  Thought process: normal  Thought content:   WNL  Sensory/Perceptual disturbances:   WNL  Orientation: oriented to person, place, time/date, situation, day of week, month of year, and year  Attention: Good  Concentration: Good  Memory: WNL  Fund of knowledge:  Good  Insight:   Good  Judgment:  Good  Impulse Control: Good   Risk Assessment: Danger to Self:  No Self-injurious Behavior: No Danger to Others: No Duty to Warn:no Physical Aggression / Violence:No  Access to Firearms a concern: No  Gang Involvement:No   Subjective: It has been a difficult week emotionally for the patient.  Over the past couple of days she has spoken to her primary care office as well as urgent care because she became overwhelmed while looking at her credit report.  Her husband had signed for 10 different credit cards in her name, forging her name.  The most recent one was about $1500 and she realized a letter had been hidden from her by her husband addressing that.  She had to call the credit card company and agreed to make payments but that is coming out of his money but her credit report overall overwhelmed her and she became extremely angry.  She did not sleep well for 2 days because she could not come to  some sort of solution.  She decided that as much as she had put up with over the years between deception and lying and other poor choices that her husband had made that she could no longer live with him.  She talked to her adult son and daughter about that and they are working on what her possibilities of housing for him with her be assisted living or something else.  She feels that she had set boundaries which were respectful and that he could be upstairs in her downstairs but now she does not want to be in the same house with him.  She said that most of the credit card stuff can be dealt with in 2025 and that she cannot be responsible for something she did not agree to or not contribute to at all.  She has been praying and using her faith which has helped but she does not like feeling this angry.  She says that she has had enough of being deceived and lied to.  We talked about what she could do in terms of recognizing what she can control physically financially but also how can she challenge some of those angry and anxious thought.  We did not deny the fact that she has a right to be frustrated and feel deceived and angry but we talked about the impact that has on her mentally emotionally and physically especially as she has had a stroke earlier this year.  We talked  about boundaries both physical mental and emotional.  She says that for the most part she is trying to avoid even being in the same room with her husband much less speaking to him.  She feels shortchanged because this is not what her retirement years were supposed to look like so we started talking about creating a new narrative for her life from this moment forward.  I encouraged her to think about what it could look like once some of the other things are settled but how she can start to see herself and what she can and cannot do in the near future.   Interventions: Cognitive Behavioral Therapy  Diagnosis: Adjustment disorder with mixed  anxiety and depressed mood.  Plan: I will meet with the patient every 2 weeks via video session Tx. Plan:To use cognitive behavioral therapy principles as well as elements of dialectical behavior therapy.  Goals are to reduce anxiety and depression by at least 50% with a target date of November 06, 2022.  Goals are to have less sadness as indicated by PHQ-9 scores as well as patient report.  We also work on improving mood and return to a healthier level of functioning as defined by her goals for being happy, identify causes and process triggers for depressed mood.  We will use cognitive behavioral therapy to explore and replace unhealthy thoughts and behavior patterns contributing to depression.  I will continue to encourage shearing of feelings related to the causes and symptoms of depression as well as teach and encouraged use of coping skills for management of depressive symptoms.  We also will work to improve the patient's ability to manage anxiety symptoms and better handle stress, identify causes for anxiety and explore ways for reduction of anxiety in addition to resolving conflicts contributing to anxiety and managing thoughts and worrisome thinking is contributing to anxiety.  Interventions will include providing education about anxiety, facilitate problem solution skills, teaching coping skills for managing anxiety such as grounding exercises, progressive muscle relaxation and cognitive re framing etc.  We will also use cognitive behavioral therapy to identify and change anxiety provoking thoughts and behavior patterns as well as using DBT distress tolerance and mindfulness skills.  Progress: 35% [name], [facility]                 [name], [facility]               [name], [facility]               [name], [facility]               [name], [facility]               [name],  [facility]               [name], [facility]               [name], [facility]               [name], [facility]

## 2022-08-25 ENCOUNTER — Encounter: Payer: Self-pay | Admitting: Nurse Practitioner

## 2022-08-25 NOTE — Telephone Encounter (Signed)
You need to see patient in office right? Looks like she has been seen by behavorial health  last on 8/15.

## 2022-08-27 ENCOUNTER — Telehealth: Payer: Self-pay | Admitting: Nurse Practitioner

## 2022-08-27 ENCOUNTER — Other Ambulatory Visit: Payer: Self-pay | Admitting: Nurse Practitioner

## 2022-08-27 ENCOUNTER — Other Ambulatory Visit (HOSPITAL_COMMUNITY): Payer: Self-pay | Admitting: Cardiology

## 2022-08-27 DIAGNOSIS — F411 Generalized anxiety disorder: Secondary | ICD-10-CM

## 2022-08-27 LAB — CUP PACEART REMOTE DEVICE CHECK
Date Time Interrogation Session: 20240820000413
Implantable Pulse Generator Implant Date: 20210301
Zone Setting Status: 755011
Zone Setting Status: 755011
Zone Setting Status: 755011
Zone Setting Status: 755011

## 2022-08-27 NOTE — Telephone Encounter (Signed)
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 She is no longer on amiodarone. I think it would be ok to go back on the medication but want to make sure you are ok with it also. Thanks for your help  Margie Billet

## 2022-08-28 ENCOUNTER — Ambulatory Visit (INDEPENDENT_AMBULATORY_CARE_PROVIDER_SITE_OTHER): Payer: Medicare Other

## 2022-08-28 DIAGNOSIS — I480 Paroxysmal atrial fibrillation: Secondary | ICD-10-CM | POA: Diagnosis not present

## 2022-09-01 ENCOUNTER — Other Ambulatory Visit (HOSPITAL_COMMUNITY): Payer: Self-pay

## 2022-09-05 ENCOUNTER — Ambulatory Visit: Payer: Medicare Other | Admitting: Behavioral Health

## 2022-09-05 ENCOUNTER — Encounter: Payer: Self-pay | Admitting: Behavioral Health

## 2022-09-05 DIAGNOSIS — F4323 Adjustment disorder with mixed anxiety and depressed mood: Secondary | ICD-10-CM | POA: Diagnosis not present

## 2022-09-05 DIAGNOSIS — F411 Generalized anxiety disorder: Secondary | ICD-10-CM

## 2022-09-05 NOTE — Progress Notes (Signed)
Behavioral Health Counselor/Therapist Progress Note  Patient ID: Shannon [name], MRN: [id number],    Date: 09/05/2022  Time Spent: , 2 PM until 2:55 PM this session was held via video teletherapy. The patient consented to the video teletherapy and was located in her home during this session. She is aware it is the responsibility of the patient to secure confidentiality on her end of the session. The provider was in a private home office for the duration of this session.     Reported Symptoms: Anxiety, depression  Mental Status Exam: Appearance:  Well Groomed     Behavior: Appropriate  Motor: Normal  Speech/Language:  Clear and Coherent  Affect: Appropriate for  Mood: normal  Thought process: normal  Thought content:   WNL  Sensory/Perceptual disturbances:   WNL  Orientation: oriented to person, place, time/date, situation, day of week, month of year, and year  Attention: Good  Concentration: Good  Memory: WNL  Fund of knowledge:  Good  Insight:   Good  Judgment:  Good  Impulse Control: Good   Risk Assessment: Danger to Self:  No Self-injurious Behavior: No Danger to Others: No Duty to Warn:no Physical Aggression / Violence:No  Access to Firearms a concern: No  Gang Involvement:No   Subjective: It has been a rough couple of weeks for the patient.  There have been significant changes in the family dynamic which came with some difficult decisions and significant anxiety and stress for the patient.  She was able to step away and spent some time initially with her sister which did not go well but then with her best friend in [place].  She plans to make that regular scheduled visit at least once a month.  There are some changes taking place within her household which she feels will be significantly better but she knows there is an applanation.  She acknowledges there have been racing thoughts feeling overwhelmed but is starting to slightly feel acetylene.   Faith is important to her so I encouraged her to continue to lean on her faith as well as use coping skills especially those which help challenge anxious thoughts and reframe them as well as distractions.  Interventions: Cognitive Behavioral Therapy  Diagnosis: Adjustment disorder with mixed anxiety and depressed mood.  Plan: I will meet with the patient every 2 weeks via video session Tx. Plan:To use cognitive behavioral therapy principles as well as elements of dialectical behavior therapy.  Goals are to reduce anxiety and depression by at least 50% with a target date of November 06, 2022.  Goals are to have less sadness as indicated by PHQ-9 scores as well as patient report.  We also work on improving mood and return to a healthier level of functioning as defined by her goals for being happy, identify causes and process triggers for depressed mood.  We will use cognitive behavioral therapy to explore and replace unhealthy thoughts and behavior patterns contributing to depression.  I will continue to encourage shearing of feelings related to the causes and symptoms of depression as well as teach and encouraged use of coping skills for management of depressive symptoms.  We also will work to improve the patient's ability to manage anxiety symptoms and better handle stress, identify causes for anxiety and explore ways for reduction of anxiety in addition to resolving conflicts contributing to anxiety and managing thoughts and worrisome thinking is contributing to anxiety.  Interventions will include providing education about anxiety, facilitate problem solution skills, teaching coping skills for  managing anxiety such as grounding exercises, progressive muscle relaxation and cognitive re framing etc.  We will also use cognitive behavioral therapy to identify and change anxiety provoking thoughts and behavior patterns as well as using DBT distress tolerance and mindfulness skills.  Progress: 35% [name], [facility]                 [name], [facility]               [name], [facility]               [name], [facility]               [name], [facility]               [name], [facility]               [name], [facility]               [name], [facility]               [name], [facility]               [name], [facility]

## 2022-09-09 NOTE — Progress Notes (Signed)
Carelink Summary Report / Loop Recorder 

## 2022-09-12 ENCOUNTER — Encounter: Payer: Self-pay | Admitting: Behavioral Health

## 2022-09-12 ENCOUNTER — Ambulatory Visit (INDEPENDENT_AMBULATORY_CARE_PROVIDER_SITE_OTHER): Payer: Medicare Other | Admitting: Behavioral Health

## 2022-09-12 DIAGNOSIS — F411 Generalized anxiety disorder: Secondary | ICD-10-CM

## 2022-09-12 DIAGNOSIS — F4323 Adjustment disorder with mixed anxiety and depressed mood: Secondary | ICD-10-CM | POA: Diagnosis not present

## 2022-09-12 NOTE — Progress Notes (Signed)
[facility] Counselor/Therapist Progress Note  Patient ID: Shannon [name], MRN: [id number],    Date: 09/12/2022  Time Spent: 53 minutes, 3 PM until 3:53 PM this session was held via video teletherapy. The patient consented to the video teletherapy and was located in her home during this session. She is aware it is the responsibility of the patient to secure confidentiality on her end of the session. The provider was in a private home office for the duration of this session.     Reported Symptoms: Anxiety, depression  Mental Status Exam: Appearance:  Well Groomed     Behavior: Appropriate  Motor: Normal  Speech/Language:  Clear and Coherent  Affect: Appropriate for  Mood: normal  Thought process: normal  Thought content:   WNL  Sensory/Perceptual disturbances:   WNL  Orientation: oriented to person, place, time/date, situation, day of week, month of year, and year  Attention: Good  Concentration: Good  Memory: WNL  Fund of knowledge:  Good  Insight:   Good  Judgment:  Good  Impulse Control: Good   Risk Assessment: Danger to Self:  No Self-injurious Behavior: No Danger to Others: No Duty to Warn:no Physical Aggression / Violence:No  Access to Firearms a concern: No  Gang Involvement:No   Subjective: Things have started to settle a little bit over the past week.  She has not had any conversation with her sister after the difficult time a couple of weeks ago.  She is setting mental emotional and physical boundaries with her saying there has never been a great relationship but she sees that they are significantly different who they are and will be difficult to have a good relationship  with her sister moving forward.  She does plan to go spend a long weekend or more roughly once a month with her friend in [place] knowing that is good for both of them and I encouraged her to do so.  It she and her daughter are going to start visiting churches.  Her daughter is  trying to find someone who could sit with her grandson a few hours at least a few days a week or at least a church daycare or something where the patient can have a break a few hours a few days a week.  She is taking a closer look at their finances.  She did receive another letter which in the past would have upset her greatly but she recognizes that she can only do so much about it and will let that play out, keeping an eye on it.  We did talk about all that is going on in the [place] and in the world and there is some anxiety on her part but she knows that she cannot do much about it except to exercise her faith and to pray about it which she is doing.  Her faith is a big coping skill for her. Interventions: Cognitive Behavioral Therapy  Diagnosis: Adjustment disorder with mixed anxiety and depressed mood.  Plan: I will meet with the patient every 2 weeks via video session Tx. Plan:To use cognitive behavioral therapy principles as well as elements of dialectical behavior therapy.  Goals are to reduce anxiety and depression by at least 50% with a target date of November 06, 2022.  Goals are to have less sadness as indicated by PHQ-9 scores as well as patient report.  We also work on improving mood and return to a healthier level of functioning as defined by her goals for  being happy, identify causes and process triggers for depressed mood.  We will use cognitive behavioral therapy to explore and replace unhealthy thoughts and behavior patterns contributing to depression.  I will continue to encourage shearing of feelings related to the causes and symptoms of depression as well as teach and encouraged use of coping skills for management of depressive symptoms.  We also will work to improve the patient's ability to manage anxiety symptoms and better handle stress, identify causes for anxiety and explore ways for reduction of anxiety in addition to resolving conflicts contributing to anxiety and managing  thoughts and worrisome thinking is contributing to anxiety.  Interventions will include providing education about anxiety, facilitate problem solution skills, teaching coping skills for managing anxiety such as grounding exercises, progressive muscle relaxation and cognitive re framing etc.  We will also use cognitive behavioral therapy to identify and change anxiety provoking thoughts and behavior patterns as well as using DBT distress tolerance and mindfulness skills.  Progress: 35% Shannon [name], [facility]                 Shannon [name], [facility]               Shannon [name], [facility]               Shannon [name], [facility]               Shannon [name], [facility]               Shannon [name], [facility]               Shannon [name], [facility]               Shannon [name], [facility]               Shannon [name], [facility]               Shannon [name], [facility]               Shannon [name], [facility]

## 2022-09-15 ENCOUNTER — Ambulatory Visit: Payer: Medicare Other | Admitting: Adult Health

## 2022-09-15 ENCOUNTER — Encounter: Payer: Self-pay | Admitting: Adult Health

## 2022-09-15 VITALS — BP 150/70 | HR 70 | Temp 98.2°F | Ht 67.0 in | Wt 201.8 lb

## 2022-09-15 DIAGNOSIS — G4733 Obstructive sleep apnea (adult) (pediatric): Secondary | ICD-10-CM | POA: Diagnosis not present

## 2022-09-15 DIAGNOSIS — J4540 Moderate persistent asthma, uncomplicated: Secondary | ICD-10-CM

## 2022-09-15 DIAGNOSIS — Z87891 Personal history of nicotine dependence: Secondary | ICD-10-CM | POA: Diagnosis not present

## 2022-09-15 NOTE — Patient Instructions (Addendum)
Xopenex inhaler As needed   Continue on CPAP At bedtime  .  Keep up the good work. Do not drive if sleepy Work on healthy weight .  Saline nasal rinses as needed May use saline nasal gel at bedtime as needed Zyrtec 10mg  At bedtime As needed   Flu shot as planned next month.  Refer to Lung cancer CT chest screening program.  Follow up with Dr. Vassie Loll or Samanthan Dugo NP in 1 year with PFT-Spirometry with DLCO  and As needed

## 2022-09-15 NOTE — Assessment & Plan Note (Signed)
Excellent control compliance on nocturnal CPAP.  Continue on current settings  Plan  Patient Instructions  Xopenex inhaler As needed   Continue on CPAP At bedtime  .  Keep up the good work. Do not drive if sleepy Work on healthy weight .  Saline nasal rinses as needed May use saline nasal gel at bedtime as needed Zyrtec 10mg  At bedtime As needed   Flu shot as planned next month.  Refer to Lung cancer CT chest screening program.  Follow up with Dr. Vassie Loll or Antonae Zbikowski NP in 1 year with PFT-Spirometry with DLCO  and As needed

## 2022-09-15 NOTE — Assessment & Plan Note (Signed)
Chronic obstructive asthma plus or minus COPD with smoking history.  Patient has minimum symptom burden.  However she has significant airflow obstruction on PFTs with reversibility.  Declines maintenance inhaler.  Did not tolerate Breo previously.  Patient says she has no significant symptoms and does not want to use any maintenance inhaler.  She has Xopenex to use as needed.  Will repeat spirometry with DLCO on return visit.  With her smoking history referred to the lung cancer CT screening program
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
## 2022-09-16 ENCOUNTER — Other Ambulatory Visit (HOSPITAL_COMMUNITY): Payer: Self-pay

## 2022-09-19 ENCOUNTER — Ambulatory Visit (INDEPENDENT_AMBULATORY_CARE_PROVIDER_SITE_OTHER): Payer: Medicare Other | Admitting: Behavioral Health

## 2022-09-19 DIAGNOSIS — F411 Generalized anxiety disorder: Secondary | ICD-10-CM

## 2022-09-19 DIAGNOSIS — F4323 Adjustment disorder with mixed anxiety and depressed mood: Secondary | ICD-10-CM

## 2022-09-20 ENCOUNTER — Encounter: Payer: Self-pay | Admitting: Behavioral Health

## 2022-09-20 NOTE — Progress Notes (Signed)
[facility] Counselor/Therapist Progress Note  Patient ID: Dynisty Sapia, MRN: [id number],    Date: 09/20/2022  Time Spent: 55 minutes, 3 PM until 3:55 PM this session was held via video teletherapy. The patient consented to the video teletherapy and was located in her home during this session. She is aware it is the responsibility of the patient to secure confidentiality on her end of the session. The provider was in a private home office for the duration of this session.     Reported Symptoms: Anxiety, depression  Mental Status Exam: Appearance:  Well Groomed     Behavior: Appropriate  Motor: Normal  Speech/Language:  Clear and Coherent  Affect: Appropriate for  Mood: normal  Thought process: normal  Thought content:   WNL  Sensory/Perceptual disturbances:   WNL  Orientation: oriented to person, place, time/date, situation, day of week, month of year, and year  Attention: Good  Concentration: Good  Memory: WNL  Fund of knowledge:  Good  Insight:   Good  Judgment:  Good  Impulse Control: Good   Risk Assessment: Danger to Self:  No Self-injurious Behavior: No Danger to Others: No Duty to Warn:no Physical Aggression / Violence:No  Access to Firearms a concern: No  Gang Involvement:No   Subjective: The patient is beginning to feel better physically but there is still some weakness.  She feels that she we will continue to see how she does on this medication and reach out to the doctor if need be.  She has decided to set a very clear boundaries with her sister based on what happened a few weekends ago and feels that is the healthiest step for now.  She did text back and forth with her brother for his birthday and feels that relationship is good.  She and her daughter are spending more time together and feels that is growing in a very positive direction.  She is spending time with her son and his family as well as taking care of her 74-year-old grandson  consistently.  She and her daughter plan to visit a church together this Sunday.  She knows that there will be continued adjustments.  We talked about being mindful of any surprises that might pop up so that she can be in a better place emotionally to respond to them in a way that is healthy for her.  She does contract for safety having no thoughts of hurting herself or anyone else.  Interventions: Cognitive Behavioral Therapy  Diagnosis: Adjustment disorder with mixed anxiety and depressed mood.  Plan: I will meet with the patient every 2 weeks via video session Tx. Plan:To use cognitive behavioral therapy principles as well as elements of dialectical behavior therapy.  Goals are to reduce anxiety and depression by at least 50% with a target date of November 06, 2022.  Goals are to have less sadness as indicated by PHQ-9 scores as well as patient report.  We also work on improving mood and return to a healthier level of functioning as defined by her goals for being happy, identify causes and process triggers for depressed mood.  We will use cognitive behavioral therapy to explore and replace unhealthy thoughts and behavior patterns contributing to depression.  I will continue to encourage shearing of feelings related to the causes and symptoms of depression as well as teach and encouraged use of coping skills for management of depressive symptoms.  We also will work to improve the patient's ability to manage anxiety symptoms and better  handle stress, identify causes for anxiety and explore ways for reduction of anxiety in addition to resolving conflicts contributing to anxiety and managing thoughts and worrisome thinking is contributing to anxiety.  Interventions will include providing education about anxiety, facilitate problem solution skills, teaching coping skills for managing anxiety such as grounding exercises, progressive muscle relaxation and cognitive re framing etc.  We will also use cognitive  behavioral therapy to identify and change anxiety provoking thoughts and behavior patterns as well as using DBT distress tolerance and mindfulness skills.  Progress: 35% [name], [facility]                 [name], [facility]               [name], [facility]               [name], [facility]               [name], [facility]               [name], [facility]               [name], [facility]               [name], [facility]               [name], [facility]               [name], [facility]               [name], [facility]               [name], [facility]

## 2022-09-29 ENCOUNTER — Other Ambulatory Visit: Payer: Self-pay | Admitting: Nurse Practitioner

## 2022-09-29 ENCOUNTER — Ambulatory Visit: Payer: Medicare Other | Attending: Cardiology

## 2022-09-29 DIAGNOSIS — I480 Paroxysmal atrial fibrillation: Secondary | ICD-10-CM

## 2022-09-29 DIAGNOSIS — F411 Generalized anxiety disorder: Secondary | ICD-10-CM

## 2022-09-30 ENCOUNTER — Ambulatory Visit: Payer: Medicare Other | Attending: Physician Assistant | Admitting: Physician Assistant

## 2022-09-30 ENCOUNTER — Encounter: Payer: Self-pay | Admitting: Physician Assistant

## 2022-09-30 VITALS — BP 116/70 | HR 66 | Ht 67.0 in | Wt 203.4 lb

## 2022-09-30 DIAGNOSIS — I10 Essential (primary) hypertension: Secondary | ICD-10-CM

## 2022-09-30 DIAGNOSIS — I480 Paroxysmal atrial fibrillation: Secondary | ICD-10-CM

## 2022-09-30 DIAGNOSIS — R0602 Shortness of breath: Secondary | ICD-10-CM

## 2022-09-30 DIAGNOSIS — I5032 Chronic diastolic (congestive) heart failure: Secondary | ICD-10-CM | POA: Diagnosis not present

## 2022-09-30 DIAGNOSIS — D6869 Other thrombophilia: Secondary | ICD-10-CM | POA: Diagnosis not present

## 2022-09-30 MED ORDER — CARVEDILOL 12.5 MG PO TABS: 12.5000 mg | ORAL_TABLET | Freq: Two times a day (BID) | ORAL | 3 refills | Status: DC

## 2022-09-30 NOTE — Patient Instructions (Addendum)
Medication Instructions:   Your physician recommends that you continue on your current medications as directed. Please refer to the Current Medication list given to you today.   *If you need a refill on your cardiac medications before your next appointment, please call your pharmacy*   Lab Work: NONE ORDERED  TODAY    If you have labs (blood work) drawn today and your tests are completely normal, you will receive your results only by: MyChart Message (if you have MyChart) OR A paper copy in the mail If you have any lab test that is abnormal or we need to change your treatment, we will call you to review the results.   Testing/Procedures:    Follow-Up: At [facility], you and your health needs are our priority.  As part of our continuing mission to provide you with exceptional heart care, we have created designated Provider Care Teams.  These Care Teams include your primary Cardiologist (physician) and Advanced Practice Providers (APPs -  Physician Assistants and Nurse Practitioners) who all work together to provide you with the care you need, when you need it.  We recommend signing up for the patient portal called "MyChart".  Sign up information is provided on this After Visit Summary.  MyChart is used to connect with patients for Virtual Visits (Telemedicine).  Patients are able to view lab/test results, encounter notes, upcoming appointments, etc.  Non-urgent messages can be sent to your provider as well.   To learn more about what you can do with MyChart, go to [email or web address].    Your next appointment:   2 week(s) MANDATORY BATTERY DEVICE ( CONTACT  CASSIE HALL/ ANGELINE HAMMER FOR EP SCHEDULING ISSUES )   Provider:   You may see Lanier Prude, MD or one of the following Advanced Practice Providers on your designated Care Team:   Francis Dowse, [place] Casimiro Needle "Mardelle Matte" Lanna Poche, [place]   Other Instructions
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
## 2022-10-01 LAB — CUP PACEART REMOTE DEVICE CHECK
Date Time Interrogation Session: 20240924231143
Implantable Pulse Generator Implant Date: 20210301

## 2022-10-02 NOTE — Telephone Encounter (Signed)
Called pt advised of Tillery's response to concern:  It looks like the episode 10/8 was to follow up further on her symptoms via her loop recorder and see if we may need to decrease her beta blocker further.  Luella Cook also works very closely with Dr. Lalla Brothers and can further discuss your case to see if anything more urgent needs to be done at that time,  depending on how you are feeling at that time.  I think the "battery" portion should have been "device" instead.    Pt wants to know if should cancel 11/05/22 OV advised to keep appointment at this time. Pt thanked me for f/u no further concerns.

## 2022-10-03 ENCOUNTER — Ambulatory Visit: Payer: Medicare Other | Admitting: Behavioral Health

## 2022-10-08 ENCOUNTER — Other Ambulatory Visit (HOSPITAL_COMMUNITY): Payer: Self-pay | Admitting: Cardiology

## 2022-10-10 ENCOUNTER — Encounter: Payer: Self-pay | Admitting: Behavioral Health

## 2022-10-10 ENCOUNTER — Ambulatory Visit: Payer: Medicare Other | Admitting: Behavioral Health

## 2022-10-10 DIAGNOSIS — F411 Generalized anxiety disorder: Secondary | ICD-10-CM

## 2022-10-10 DIAGNOSIS — F4323 Adjustment disorder with mixed anxiety and depressed mood: Secondary | ICD-10-CM | POA: Diagnosis not present
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
## 2022-10-14 ENCOUNTER — Ambulatory Visit: Payer: Medicare Other | Attending: Cardiology | Admitting: Cardiology

## 2022-10-14 VITALS — BP 162/70 | HR 57 | Ht 67.0 in | Wt 200.6 lb

## 2022-10-14 DIAGNOSIS — D6869 Other thrombophilia: Secondary | ICD-10-CM | POA: Diagnosis not present

## 2022-10-14 DIAGNOSIS — R0602 Shortness of breath: Secondary | ICD-10-CM | POA: Diagnosis not present

## 2022-10-14 DIAGNOSIS — I480 Paroxysmal atrial fibrillation: Secondary | ICD-10-CM | POA: Diagnosis not present

## 2022-10-14 LAB — CUP PACEART INCLINIC DEVICE CHECK
Date Time Interrogation Session: 20241008153734
Implantable Pulse Generator Implant Date: 20210301

## 2022-10-14 NOTE — Progress Notes (Signed)
Carelink Summary Report / Loop Recorder 
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
## 2022-10-14 NOTE — Patient Instructions (Signed)
Medication Instructions:  The current medical regimen is effective;  continue present plan and medications.  *If you need a refill on your cardiac medications before your next appointment, please call your pharmacy*   Lab Work: Your provider would like for you to have following labs drawn today CBC, BMET, MAG.  Come back for fasting lab work at [address] office- LIPID   If you have labs (blood work) drawn today and your tests are completely normal, you will receive your results only by: MyChart Message (if you have MyChart) OR A paper copy in the mail If you have any lab test that is abnormal or we need to change your treatment, we will call you to review the results.   Testing/Procedures:  Echocardiogram (any location, before end of month if possible)  - Your physician has requested that you have an echocardiogram. Echocardiography is a painless test that uses sound waves to create images of your heart. It provides your doctor with information about the size and shape of your heart and how well your heart's chambers and valves are working. This procedure takes approximately one hour. There are no restrictions for this procedure.     Follow-Up: At [facility], you and your health needs are our priority.  As part of our continuing mission to provide you with exceptional heart care, we have created designated Provider Care Teams.  These Care Teams include your primary Cardiologist (physician) and Advanced Practice Providers (APPs -  Physician Assistants and Nurse Practitioners) who all work together to provide you with the care you need, when you need it.  We recommend signing up for the patient portal called "MyChart".  Sign up information is provided on this After Visit Summary.  MyChart is used to connect with patients for Virtual Visits (Telemedicine).  Patients are able to view lab/test results, encounter notes, upcoming appointments, etc.  Non-urgent messages can be  sent to your provider as well.   To learn more about what you can do with MyChart, go to [email or web address].    Your next appointment:   Keep scheduled appointment with Dr.Lambert.

## 2022-10-15 ENCOUNTER — Ambulatory Visit: Payer: Medicare Other | Attending: Nurse Practitioner

## 2022-10-15 ENCOUNTER — Other Ambulatory Visit: Payer: Self-pay | Admitting: Cardiology

## 2022-10-15 DIAGNOSIS — I480 Paroxysmal atrial fibrillation: Secondary | ICD-10-CM | POA: Diagnosis not present

## 2022-10-15 LAB — BASIC METABOLIC PANEL
BUN/Creatinine Ratio: 15 (ref 12–28)
BUN: 12 mg/dL (ref 8–27)
CO2: 26 mmol/L (ref 20–29)
Calcium: 9.4 mg/dL (ref 8.7–10.3)
Chloride: 103 mmol/L (ref 96–106)
Creatinine, Ser: 0.79 mg/dL (ref 0.57–1.00)
Glucose: 92 mg/dL (ref 70–99)
Potassium: 4.4 mmol/L (ref 3.5–5.2)
Sodium: 143 mmol/L (ref 134–144)
eGFR: 80 mL/min/{1.73_m2} (ref >59)

## 2022-10-15 LAB — CBC
Hematocrit: 36.3 % (ref 34.0–46.6)
Hemoglobin: 11.8 g/dL (ref 11.1–15.9)
MCH: 29.5 pg (ref 26.6–33.0)
MCHC: 32.5 g/dL (ref 31.5–35.7)
MCV: 91 fL (ref 79–97)
Platelets: 318 10*3/uL (ref 150–450)
RBC: 4 x10E6/uL (ref 3.77–5.28)
RDW: 12.8 % (ref 11.7–15.4)
WBC: 7.2 10*3/uL (ref 3.4–10.8)

## 2022-10-15 LAB — MAGNESIUM: Magnesium: 2.4 mg/dL — ABNORMAL HIGH (ref 1.6–2.3)

## 2022-10-15 LAB — LIPID PANEL
Chol/HDL Ratio: 2.4 {ratio} (ref 0.0–4.4)
Chol/HDL Ratio: 2.3 {ratio} (ref 0.0–4.4)
Cholesterol, Total: 136 mg/dL (ref 100–199)
Cholesterol, Total: 139 mg/dL (ref 100–199)
HDL: 57 mg/dL (ref >39)
HDL: 60 mg/dL (ref >39)
LDL Chol Calc (NIH): 58 mg/dL (ref 0–99)
LDL Chol Calc (NIH): 62 mg/dL (ref 0–99)
Triglycerides: 115 mg/dL (ref 0–149)
Triglycerides: 93 mg/dL (ref 0–149)
VLDL Cholesterol Cal: 21 mg/dL (ref 5–40)
VLDL Cholesterol Cal: 17 mg/dL (ref 5–40)

## 2022-10-17 ENCOUNTER — Encounter: Payer: Self-pay | Admitting: Behavioral Health

## 2022-10-17 ENCOUNTER — Ambulatory Visit (INDEPENDENT_AMBULATORY_CARE_PROVIDER_SITE_OTHER): Payer: Medicare Other | Admitting: Behavioral Health

## 2022-10-17 DIAGNOSIS — F4323 Adjustment disorder with mixed anxiety and depressed mood: Secondary | ICD-10-CM

## 2022-10-17 DIAGNOSIS — F331 Major depressive disorder, recurrent, moderate: Secondary | ICD-10-CM

## 2022-10-17 DIAGNOSIS — F411 Generalized anxiety disorder: Secondary | ICD-10-CM

## 2022-10-17 NOTE — Progress Notes (Signed)
[facility] Counselor/Therapist Progress Note  Patient ID: Shannon [name], MRN: [id number],    Date: 10/17/2022  Time Spent: 58 minutes, 11 AM until 11:58 AM.  This session was held via video teletherapy. The patient consented to the video teletherapy and was located in her home during this session. She is aware it is the responsibility of the patient to secure confidentiality on her end of the session. The provider was in a private home office for the duration of this session.     Reported Symptoms: Anxiety, depression  Mental Status Exam: Appearance:  Well Groomed     Behavior: Appropriate  Motor: Normal  Speech/Language:  Clear and Coherent  Affect: Appropriate for  Mood: normal  Thought process: normal  Thought content:   WNL  Sensory/Perceptual disturbances:   WNL  Orientation: oriented to person, place, time/date, situation, day of week, month of year, and year  Attention: Good  Concentration: Good  Memory: WNL  Fund of knowledge:  Good  Insight:   Good  Judgment:  Good  Impulse Control: Good   Risk Assessment: Danger to Self:  No Self-injurious Behavior: No Danger to Others: No Duty to Warn:no Physical Aggression / Violence:No  Access to Firearms a concern: No  Gang Involvement:No   Subjective: The patient has been experiencing increased heart rate as well as raised blood pressure.  She has had several doctor's visits this week trying to figure out what is going on.  They are combination of reasons including stress level, new blood pressure medication but they are checking the boxes to rule everything out.  There is a possibility of a pacemaker but they are looking at other things first.  There is encouraging her to stay as calm as possible so we focused on those coping skills which help her stay mindful as well as reduce her stress level.  We reiterated the importance of breathing, grounding exercises, progressive muscle relaxation as well as  introducing vagus nerve stimulation.  We talked about the importance of mindfulness separating herself mentally emotionally and physically as much as possible.  There is much joy in spending time with her grandson but she also recognizes the fatigue becomes with that.  Her daughter has found someone to watch her grandson some hours on the weekends which she is thankful for.  They are visiting churches together which she is enjoying.  She is trying to manage her financial stress as best as possible and recognizes that she can only do so much with choices that her husband's making.  She does contract for safety having no thoughts of hurting herself or anyone else.  Interventions: Cognitive Behavioral Therapy  Diagnosis: Adjustment disorder with mixed anxiety and depressed mood.  Plan: I will meet with the patient every 2 weeks via video session Tx. Plan:To use cognitive behavioral therapy principles as well as elements of dialectical behavior therapy.  Goals are to reduce anxiety and depression by at least 50% with a target date of November 06, 2022.  Goals are to have less sadness as indicated by PHQ-9 scores as well as patient report.  We also work on improving mood and return to a healthier level of functioning as defined by her goals for being happy, identify causes and process triggers for depressed mood.  We will use cognitive behavioral therapy to explore and replace unhealthy thoughts and behavior patterns contributing to depression.  I will continue to encourage shearing of feelings related to the causes and symptoms of depression as  well as teach and encouraged use of coping skills for management of depressive symptoms.  We also will work to improve the patient's ability to manage anxiety symptoms and better handle stress, identify causes for anxiety and explore ways for reduction of anxiety in addition to resolving conflicts contributing to anxiety and managing thoughts and worrisome thinking is  contributing to anxiety.  Interventions will include providing education about anxiety, facilitate problem solution skills, teaching coping skills for managing anxiety such as grounding exercises, progressive muscle relaxation and cognitive re framing etc.  We will also use cognitive behavioral therapy to identify and change anxiety provoking thoughts and behavior patterns as well as using DBT distress tolerance and mindfulness skills.  Progress: 35% [name], [facility]                 [name], [facility]               [name], [facility]               [name], [facility]               [name], [facility]               [name], [facility]               [name], [facility]               [name], [facility]               [name], [facility]               [name], [facility]               [name], [facility]               [name], [facility]               [name], [facility]               [name], [facility]

## 2022-10-20 ENCOUNTER — Encounter: Payer: Self-pay | Admitting: Nurse Practitioner

## 2022-10-23 ENCOUNTER — Ambulatory Visit (HOSPITAL_COMMUNITY): Payer: Medicare Other | Attending: Cardiology

## 2022-10-23 DIAGNOSIS — I480 Paroxysmal atrial fibrillation: Secondary | ICD-10-CM | POA: Insufficient documentation

## 2022-10-23 LAB — ECHOCARDIOGRAM COMPLETE
Area-P 1/2: 4.23 cm2
S' Lateral: 4.2 cm

## 2022-10-24 ENCOUNTER — Ambulatory Visit (INDEPENDENT_AMBULATORY_CARE_PROVIDER_SITE_OTHER): Payer: Medicare Other | Admitting: Behavioral Health

## 2022-10-24 ENCOUNTER — Encounter: Payer: Self-pay | Admitting: Behavioral Health

## 2022-10-24 DIAGNOSIS — F4323 Adjustment disorder with mixed anxiety and depressed mood: Secondary | ICD-10-CM

## 2022-10-24 DIAGNOSIS — F411 Generalized anxiety disorder: Secondary | ICD-10-CM

## 2022-10-24 DIAGNOSIS — F331 Major depressive disorder, recurrent, moderate: Secondary | ICD-10-CM

## 2022-10-24 NOTE — Progress Notes (Signed)
[facility] Counselor/Therapist Progress Note  Patient ID: Shannon [name], MRN: [id number],    Date: 10/24/2022  Time Spent: 54 minutes,3:02 to 3:56 pm    This session was held via video teletherapy. The patient consented to the video teletherapy and was located in her home during this session. She is aware it is the responsibility of the patient to secure confidentiality on her end of the session. The provider was in a private home office for the duration of this session.     Reported Symptoms: Anxiety, depression  Mental Status Exam: Appearance:  Well Groomed     Behavior: Appropriate  Motor: Normal  Speech/Language:  Clear and Coherent  Affect: Appropriate for  Mood: normal  Thought process: normal  Thought content:   WNL  Sensory/Perceptual disturbances:   WNL  Orientation: oriented to person, place, time/date, situation, day of week, month of year, and year  Attention: Good  Concentration: Good  Memory: WNL  Fund of knowledge:  Good  Insight:   Good  Judgment:  Good  Impulse Control: Good   Risk Assessment: Danger to Self:  No Self-injurious Behavior: No Danger to Others: No Duty to Warn:no Physical Aggression / Violence:No  Access to Firearms a concern: No  Gang Involvement:No   Subjective: Today has been a difficult day because she has had A-fib all day.  She said she woke up with it.  She feels that is physical in nature because she feels she is doing a good job of letting go of a lot of that which is stressful for her.  She did get an echocardiogram so the mechanics of her heart are working fine but we will meet with the electrical part of the heart team week after next to see if some things going on.  She recognizes that a pacemaker is a possibility and is okay with that if it helps stabilize A-fib.  She was able to take it easy today because her son took her grandson with his family for the day.  She gets winded easily but is finding that she is  much more at peace with things that do not go as planned and she was responding in a much healthier way.  Her daughter still talks to her husband and says that he reports that he is doing better but she feels that the situation there in his best for her right now.  She knows that he has work to do and is hopeful that he is doing that work.  She is trying to do those things such as mood regulation which she is doing a much better job of taking care of herself physically.  She and her daughter have been visiting churches because she values having a church home and community to be a part of her life.  Her grandson's birthday is this weekend at [employer] and her husband will be coming to the party.  We talked about being mindful of her reaction and setting boundaries verbally emotionally and/or physically if that is a way to make sure that she stays calm.  She does contract for safety having no thoughts of hurting herself or anyone else.  Interventions: Cognitive Behavioral Therapy  Diagnosis: Adjustment disorder with mixed anxiety and depressed mood.  Plan: I will meet with the patient every 2 weeks via video session Tx. Plan:To use cognitive behavioral therapy principles as well as elements of dialectical behavior therapy.  Goals are to reduce anxiety and depression by at least 50%  with a target date of November 06, 2022.  Goals are to have less sadness as indicated by PHQ-9 scores as well as patient report.  We also work on improving mood and return to a healthier level of functioning as defined by her goals for being happy, identify causes and process triggers for depressed mood.  We will use cognitive behavioral therapy to explore and replace unhealthy thoughts and behavior patterns contributing to depression.  I will continue to encourage shearing of feelings related to the causes and symptoms of depression as well as teach and encouraged use of coping skills for management of depressive symptoms.   We also will work to improve the patient's ability to manage anxiety symptoms and better handle stress, identify causes for anxiety and explore ways for reduction of anxiety in addition to resolving conflicts contributing to anxiety and managing thoughts and worrisome thinking is contributing to anxiety.  Interventions will include providing education about anxiety, facilitate problem solution skills, teaching coping skills for managing anxiety such as grounding exercises, progressive muscle relaxation and cognitive re framing etc.  We will also use cognitive behavioral therapy to identify and change anxiety provoking thoughts and behavior patterns as well as using DBT distress tolerance and mindfulness skills.  Progress: 35% [name], [facility]                 [name], [facility]               [name], [facility]               [name], [facility]               [name], [facility]               [name], [facility]               [name], [facility]               [name], [facility]               [name], [facility]               [name], [facility]               [name], [facility]               [name], [facility]               [name], [facility]               [name], [facility]               [name], [facility]

## 2022-10-27 ENCOUNTER — Telehealth: Payer: Self-pay

## 2022-10-27 NOTE — Telephone Encounter (Signed)
Following alert received from [employer] received for 3 symptom episodes, all were AF.  The last symptom episode correlated with the pause event.  There was a 4 second conversion pause detected.  See reports for symptoms with each event logged. Patient did report near syncope event. Patient is compliant with coreg 12.5 mg BID. Patient advised of ED precautions with verbal understanding.   Routing to [employer] d/t recent OV. Patient has follow up apt with Dr. Lalla Brothers 10/30 to discuss pacemaker.

## 2022-10-30 ENCOUNTER — Ambulatory Visit (INDEPENDENT_AMBULATORY_CARE_PROVIDER_SITE_OTHER): Payer: Medicare Other

## 2022-10-30 DIAGNOSIS — R55 Syncope and collapse: Secondary | ICD-10-CM

## 2022-10-30 LAB — CUP PACEART REMOTE DEVICE CHECK
Date Time Interrogation Session: 20241023231346
Implantable Pulse Generator Implant Date: 20210301

## 2022-10-31 ENCOUNTER — Ambulatory Visit (INDEPENDENT_AMBULATORY_CARE_PROVIDER_SITE_OTHER): Payer: Medicare Other | Admitting: Behavioral Health

## 2022-10-31 ENCOUNTER — Encounter: Payer: Self-pay | Admitting: Behavioral Health

## 2022-10-31 DIAGNOSIS — F4323 Adjustment disorder with mixed anxiety and depressed mood: Secondary | ICD-10-CM | POA: Diagnosis not present

## 2022-10-31 DIAGNOSIS — F411 Generalized anxiety disorder: Secondary | ICD-10-CM

## 2022-10-31 NOTE — Progress Notes (Signed)
[facility] Counselor/Therapist Progress Note  Patient ID: Genivieve Callier, MRN: [id number],    Date: 10/31/2022  Time Spent: 52 minutes, 1 PM until 1:52 PM   this session was held via video teletherapy. The patient consented to the video teletherapy and was located in her home during this session. She is aware it is the responsibility of the patient to secure confidentiality on her end of the session. The provider was in a private home office for the duration of this session.     Reported Symptoms: Anxiety, depression  Mental Status Exam: Appearance:  Well Groomed     Behavior: Appropriate  Motor: Normal  Speech/Language:  Clear and Coherent  Affect: Appropriate for  Mood: normal  Thought process: normal  Thought content:   WNL  Sensory/Perceptual disturbances:   WNL  Orientation: oriented to person, place, time/date, situation, day of week, month of year, and year  Attention: Good  Concentration: Good  Memory: WNL  Fund of knowledge:  Good  Insight:   Good  Judgment:  Good  Impulse Control: Good   Risk Assessment: Danger to Self:  No Self-injurious Behavior: No Danger to Others: No Duty to Warn:no Physical Aggression / Violence:No  Access to Firearms a concern: No  Gang Involvement:No   Subjective: The patient's A-fib continued to act up through last Saturday so she was not able to go to her grandson's birthday party.  She was disappointed but knew she did not feel well enough and that would not have been a good environment for her.  Just in the last day or 2 has it started to return to normal.  She meets with her cardiologist next week as well as her primary care physician.  Her husband did come to the house for a brief period and said it was fairly calm.  The also exchange some text messages ironing out some details which she feels will go okay.  She is sleeping better.  She had a good conversation with her brother and feels much better about that.  She  is still debating on when she wants to reach out to one of her sisters but says she knows the time is not right now but says her brother sees their sister the same as she does.  She was not able to go to church last week because of how she felt but they will continue to visit churches.  She is using coping skills to help keep her stress level calm.  He said even in conversations with her husband she did not get angry or upset as she would have in the past.  She sees that as a sign of progress.  She does contract for safety having no thoughts of hurting herself or anyone else.  Interventions: Cognitive Behavioral Therapy  Diagnosis: Adjustment disorder with mixed anxiety and depressed mood.  Plan: I will meet with the patient every 2 weeks via video session Tx. Plan:To use cognitive behavioral therapy principles as well as elements of dialectical behavior therapy.  Goals are to reduce anxiety and depression by at least 50% with a target date of November 06, 2022.  Goals are to have less sadness as indicated by PHQ-9 scores as well as patient report.  We also work on improving mood and return to a healthier level of functioning as defined by her goals for being happy, identify causes and process triggers for depressed mood.  We will use cognitive behavioral therapy to explore and replace unhealthy thoughts and  behavior patterns contributing to depression.  I will continue to encourage shearing of feelings related to the causes and symptoms of depression as well as teach and encouraged use of coping skills for management of depressive symptoms.  We also will work to improve the patient's ability to manage anxiety symptoms and better handle stress, identify causes for anxiety and explore ways for reduction of anxiety in addition to resolving conflicts contributing to anxiety and managing thoughts and worrisome thinking is contributing to anxiety.  Interventions will include providing education about anxiety,  facilitate problem solution skills, teaching coping skills for managing anxiety such as grounding exercises, progressive muscle relaxation and cognitive re framing etc.  We will also use cognitive behavioral therapy to identify and change anxiety provoking thoughts and behavior patterns as well as using DBT distress tolerance and mindfulness skills.  Progress: 35% [name], [facility]                 [name], [facility]               [name], [facility]               [name], [facility]               [name], [facility]               [name], [facility]               [name], [facility]               [name], [facility]               [name], [facility]               [name], [facility]               [name], [facility]               [name], [facility]               [name], [facility]               [name], [facility]               [name], [facility]               [name], [facility]

## 2022-11-04 NOTE — Progress Notes (Deleted)
  Electrophysiology Office Follow up Visit Note:    Date:  11/04/2022   ID:  Shannon [name], DOB [birth year], MRN [id number]  PCP:  Eden Emms, NP  [facility] Cardiologist:  None  [facility] Electrophysiologist:  Lanier Prude, MD    Interval History:     Shannon [name] is a 72 y.o. female who presents for a follow up visit.   Discussed the use of AI scribe software for clinical note transcription with the patient, who gave verbal consent to proceed.  History of Present Illness                Past medical, surgical, social and family history were reviewed.  ROS:   Please see the history of present illness.    All other systems reviewed and are negative.  EKGs/Labs/Other Studies Reviewed:    The following studies were reviewed today:       Physical Exam:    VS:  There were no vitals taken for this visit.    Wt Readings from Last 3 Encounters:  10/14/22 200 lb 9.6 oz (91 kg)  09/30/22 203 lb 6.4 oz (92.3 kg)  09/15/22 201 lb 12.8 oz (91.5 kg)     GEN: *** Well nourished, well developed in no acute distress CARDIAC: ***RRR, no murmurs, rubs, gallops RESPIRATORY:  Clear to auscultation without rales, wheezing or rhonchi       ASSESSMENT:    No diagnosis found. PLAN:    In order of problems listed above:  Assessment and Plan                    Signed, Steffanie Dunn, MD, [facility], [facility] 11/04/2022 1:30 PM    Electrophysiology [facility]

## 2022-11-05 ENCOUNTER — Ambulatory Visit: Payer: Medicare Other | Admitting: Cardiology

## 2022-11-05 DIAGNOSIS — I480 Paroxysmal atrial fibrillation: Secondary | ICD-10-CM

## 2022-11-05 DIAGNOSIS — I10 Essential (primary) hypertension: Secondary | ICD-10-CM

## 2022-11-05 DIAGNOSIS — I495 Sick sinus syndrome: Secondary | ICD-10-CM

## 2022-11-05 DIAGNOSIS — I5032 Chronic diastolic (congestive) heart failure: Secondary | ICD-10-CM

## 2022-11-06 ENCOUNTER — Ambulatory Visit: Payer: Medicare Other | Admitting: Behavioral Health

## 2022-11-06 ENCOUNTER — Encounter: Payer: Self-pay | Admitting: Behavioral Health

## 2022-11-06 DIAGNOSIS — F4323 Adjustment disorder with mixed anxiety and depressed mood: Secondary | ICD-10-CM

## 2022-11-06 DIAGNOSIS — F411 Generalized anxiety disorder: Secondary | ICD-10-CM

## 2022-11-06 NOTE — Progress Notes (Signed)
[facility] Counselor/Therapist Progress Note  Patient ID: Shannon [name], MRN: [id number],    Date: 11/06/2022  Time Spent: 44 minutes, 2 PM until 2:44 PM   this session was held via video teletherapy. The patient consented to the video teletherapy and was located in her home during this session. She is aware it is the responsibility of the patient to secure confidentiality on her end of the session. The provider was in a private home office for the duration of this session.     Reported Symptoms: Anxiety, depression  Mental Status Exam: Appearance:  Well Groomed     Behavior: Appropriate  Motor: Normal  Speech/Language:  Clear and Coherent  Affect: Appropriate for  Mood: normal  Thought process: normal  Thought content:   WNL  Sensory/Perceptual disturbances:   WNL  Orientation: oriented to person, place, time/date, situation, day of week, month of year, and year  Attention: Good  Concentration: Good  Memory: WNL  Fund of knowledge:  Good  Insight:   Good  Judgment:  Good  Impulse Control: Good   Risk Assessment: Danger to Self:  No Self-injurious Behavior: No Danger to Others: No Duty to Warn:no Physical Aggression / Violence:No  Access to Firearms a concern: No  Gang Involvement:No   Subjective: The patient is feeling better.  Her blood pressure and heart rate are becoming more stable.  She was upset because she had a cardiology appointment yesterday and left in plenty of time but somehow got turned around and was 30 minutes late for her appointment and they could not see her.  She is not scheduled again until December.  She had a very positive reframe saying if there had been some concern that would have let her know because they do have data from when she will be monitor.  She is still practicing good self-care.  She is being careful about what she does and is practicing coping.  She did say she is limited how much she is watching on the upcoming  election but is watching some very positive podcast another shows that she is enjoying.  She is enjoying time with her grandson and family.  She spoken to her brother and that went well.  There was 1 conversation with her husband but she did not let that bother her.  She reports that her anxiety is getting better and feels good about it.  She does contract for safety having no thoughts of hurting herself or anyone else.  Interventions: Cognitive Behavioral Therapy  Diagnosis: Adjustment disorder with mixed anxiety and depressed mood.  Plan: I will meet with the patient every 2 weeks via video session Tx. Plan:To use cognitive behavioral therapy principles as well as elements of dialectical behavior therapy.  Goals are to reduce anxiety and depression by at least 50% with a target date of November 06, 2022.  Goals are to have less sadness as indicated by PHQ-9 scores as well as patient report.  We also work on improving mood and return to a healthier level of functioning as defined by her goals for being happy, identify causes and process triggers for depressed mood.  We will use cognitive behavioral therapy to explore and replace unhealthy thoughts and behavior patterns contributing to depression.  I will continue to encourage shearing of feelings related to the causes and symptoms of depression as well as teach and encouraged use of coping skills for management of depressive symptoms.  We also will work to improve the patient's ability  to manage anxiety symptoms and better handle stress, identify causes for anxiety and explore ways for reduction of anxiety in addition to resolving conflicts contributing to anxiety and managing thoughts and worrisome thinking is contributing to anxiety.  Interventions will include providing education about anxiety, facilitate problem solution skills, teaching coping skills for managing anxiety such as grounding exercises, progressive muscle relaxation and cognitive re  framing etc.  We will also use cognitive behavioral therapy to identify and change anxiety provoking thoughts and behavior patterns as well as using DBT distress tolerance and mindfulness skills.  Progress: 35% [name], [facility]                 [name], [facility]               [name], [facility]               [name], [facility]               [name], [facility]               [name], [facility]               [name], [facility]               [name], [facility]               [name], [facility]               [name], [facility]               [name], [facility]               [name], [facility]               [name], [facility]               [name], [facility]               [name], [facility]               [name], [facility]               [name], [facility]

## 2022-11-07 ENCOUNTER — Ambulatory Visit (INDEPENDENT_AMBULATORY_CARE_PROVIDER_SITE_OTHER): Payer: Medicare Other | Admitting: Nurse Practitioner

## 2022-11-07 ENCOUNTER — Encounter: Payer: Self-pay | Admitting: Nurse Practitioner

## 2022-11-07 VITALS — BP 140/76 | HR 64 | Temp 98.1°F | Ht 67.0 in | Wt 201.0 lb

## 2022-11-07 DIAGNOSIS — F32A Depression, unspecified: Secondary | ICD-10-CM

## 2022-11-07 DIAGNOSIS — I10 Essential (primary) hypertension: Secondary | ICD-10-CM | POA: Diagnosis not present

## 2022-11-07 DIAGNOSIS — J4520 Mild intermittent asthma, uncomplicated: Secondary | ICD-10-CM | POA: Diagnosis not present

## 2022-11-07 DIAGNOSIS — G4733 Obstructive sleep apnea (adult) (pediatric): Secondary | ICD-10-CM

## 2022-11-07 DIAGNOSIS — Z8673 Personal history of transient ischemic attack (TIA), and cerebral infarction without residual deficits: Secondary | ICD-10-CM

## 2022-11-07 DIAGNOSIS — E669 Obesity, unspecified: Secondary | ICD-10-CM

## 2022-11-07 DIAGNOSIS — Z0000 Encounter for general adult medical examination without abnormal findings: Secondary | ICD-10-CM | POA: Diagnosis not present

## 2022-11-07 DIAGNOSIS — Z122 Encounter for screening for malignant neoplasm of respiratory organs: Secondary | ICD-10-CM

## 2022-11-07 DIAGNOSIS — I480 Paroxysmal atrial fibrillation: Secondary | ICD-10-CM | POA: Diagnosis not present

## 2022-11-07 DIAGNOSIS — F419 Anxiety disorder, unspecified: Secondary | ICD-10-CM

## 2022-11-07 DIAGNOSIS — Z1231 Encounter for screening mammogram for malignant neoplasm of breast: Secondary | ICD-10-CM | POA: Diagnosis not present

## 2022-11-07 DIAGNOSIS — M8580 Other specified disorders of bone density and structure, unspecified site: Secondary | ICD-10-CM

## 2022-11-07 HISTORY — DX: Obesity, unspecified: E66.9

## 2022-11-07 MED ORDER — VENLAFAXINE HCL ER 37.5 MG PO CP24
37.5000 mg | ORAL_CAPSULE | Freq: Every day | ORAL | 0 refills | Status: DC
Start: 2022-11-07 — End: 2023-01-01

## 2022-11-07 NOTE — Assessment & Plan Note (Signed)
History of the same.  Patient is having side effects of duloxetine 30 mg.  Will discontinue and start venlafaxine 37.5 mg daily.  Continue buspirone 5 mg twice daily exam taking to the therapist.  Patient denies HI/SI/AVH.

## 2022-11-07 NOTE — Assessment & Plan Note (Signed)
Patient has been working hard and lost approximate 20 pounds over the past year.  Started back on antidepressant and she is having difficulty losing weight continue working healthy lifestyle modifications.

## 2022-11-07 NOTE — Assessment & Plan Note (Signed)
History of the same follow-up with pulmonology.  Patient seems adherent to therapy continue therapy as recommended follow-up with specialist as recommended

## 2022-11-07 NOTE — Assessment & Plan Note (Signed)
History of the same follow-up pulmonology patient currently maintained on albuterol inhaler only.  Continue taking inhalers as prescribed continue following up with specialist as recommended

## 2022-11-07 NOTE — Assessment & Plan Note (Signed)
Discussed age-appropriate immunizations and screening exams.  Did review patient's personal, surgical, social, family history.  Patient up-to-date on all age-appropriate vaccinations she would like.  Will update patient's flu vaccine today.  She can get her shingles vaccine at local pharmacy.  Patient's overdue for CRC screening but is try to get her heart situated first.  Patient is due for mammogram order placed today she will call and get set up.  Patient is aged out for cervical cancer screening.  Patient is given information at discharge about preventative healthcare maintenance with anticipatory guidance.

## 2022-11-07 NOTE — Assessment & Plan Note (Signed)
History of the same.  Patient's DEXA scan is up-to-date

## 2022-11-07 NOTE — Patient Instructions (Signed)
Nice to see you today I want to see you in 6 weeks, sooner If you need me We will switch duloxetine to venlafaxine

## 2022-11-07 NOTE — Progress Notes (Signed)
Established Patient Office Visit  Subjective   Patient ID: Shannon [name], female    DOB: 1950/11/18  Age: 72 y.o. MRN: [id number]  Chief Complaint  Patient presents with   Annual Exam    Not fasting     HPI  for complete physical and follow up of chronic conditions.  HTN:on amlodipidine carvedilol, furesomide. States that she as seen Dr. Lalla Brothers and her BP has been high  CHF/Afib: has loop recorder and going ina dn out of afib.   Anxiety/depression: on buspar and duloxiene she is still seeing therapist. State that she feels foggy, bloated, and  dizziness. States that she feels like she is keeping weight around her midsection    Asthma: states that as of late she is using it more often. Thinks it is with allergies. She is followed by pulmonary   OSA: CPAP with naps and nighttime sleep    Immunizations: -Tetanus: Completed in 2023 -Influenza: Update today -Shingles: Get at local pharmacy  -Pneumonia: Completed will need Prevnar 20 next year  Diet: Fair diet. 3 meals a day sometimes she will snack. She will drink coffee water and rarely soda  Exercise: No regular exercise.  Eye exam: Completes annually.   Dental exam: Completes semi-annually    Colonoscopy: Completed in 2020 cologuard that was positive, colonoscopy in 2020 with recall in 3 years patient is overdue. Lung Cancer Screening: Referral placed today   Pap semar: age out   Mammogram: Order placed today  DEXA: Up-to-date  Sleep: going to bed around 1130-12 and getting up around 830. Feels rested most times. On CPAP       Review of Systems  Constitutional:  Negative for chills and fever.  Respiratory:  Positive for shortness of breath (baseline).   Cardiovascular:  Positive for palpitations. Negative for chest pain and leg swelling.  Gastrointestinal:  Negative for abdominal pain, blood in stool, constipation, diarrhea, nausea and vomiting.       BM Daily  Genitourinary:  Negative for dysuria and  hematuria.  Neurological:  Positive for dizziness. Negative for tingling and headaches.  Psychiatric/Behavioral:  Negative for hallucinations and suicidal ideas.       Objective:     BP (!) 140/76   Pulse 64   Temp 98.1 F (36.7 C) (Oral)   Ht 5\' 7"  (1.702 m)   Wt 201 lb (91.2 kg)   SpO2 94%   BMI 31.48 kg/m  BP Readings from Last 3 Encounters:  11/07/22 (!) 140/76  10/14/22 (!) 162/70  09/30/22 116/70   Wt Readings from Last 3 Encounters:  11/07/22 201 lb (91.2 kg)  10/14/22 200 lb 9.6 oz (91 kg)  09/30/22 203 lb 6.4 oz (92.3 kg)   SpO2 Readings from Last 3 Encounters:  11/07/22 94%  10/14/22 96%  09/30/22 94%      Physical Exam Vitals and nursing note reviewed.  Constitutional:      Appearance: Normal appearance.  HENT:     Right Ear: Tympanic membrane, ear canal and external ear normal.     Left Ear: Tympanic membrane, ear canal and external ear normal.     Mouth/Throat:     Mouth: Mucous membranes are moist.     Pharynx: Oropharynx is clear.  Eyes:     Extraocular Movements: Extraocular movements intact.     Pupils: Pupils are equal, round, and reactive to light.  Cardiovascular:     Rate and Rhythm: Normal rate and regular rhythm.     Pulses: Normal  pulses.     Heart sounds: Normal heart sounds.  Pulmonary:     Effort: Pulmonary effort is normal.     Breath sounds: Normal breath sounds.  Abdominal:     General: Bowel sounds are normal. There is no distension.     Palpations: There is no mass.     Tenderness: There is no abdominal tenderness.     Hernia: No hernia is present.  Musculoskeletal:     Right lower leg: No edema.     Left lower leg: No edema.  Lymphadenopathy:     Cervical: No cervical adenopathy.  Skin:    General: Skin is warm.  Neurological:     General: No focal deficit present.     Mental Status: She is alert.     Deep Tendon Reflexes:     Reflex Scores:      Bicep reflexes are 2+ on the right side and 2+ on the left side.       Patellar reflexes are 2+ on the right side and 2+ on the left side.    Comments: Bilateral upper and lower extremity strength 5/5  Psychiatric:        Mood and Affect: Mood normal.        Behavior: Behavior normal.        Thought Content: Thought content normal.        Judgment: Judgment normal.      No results found for any visits on 11/07/22.    The ASCVD Risk score (Arnett DK, et al., 2019) failed to calculate for the following reasons:   The patient has a prior MI or stroke diagnosis    Assessment & Plan:   Problem List Items Addressed This Visit       Cardiovascular and Mediastinum   Essential hypertension    Patient currently contained on carvedilol, amlodipine, and furosemide.  Blood pressure slightly above goal.  She is working with cardiology to control.  Continue medication as prescribed      Atrial fibrillation [facility])    Patient currently on anticoagulation followed by EP Dr. Steffanie Dunn.  Patient has a loop recorder.  Patient states possibility of getting a pacemaker but has a pending appointment with EP on December 2        Respiratory   OSA (obstructive sleep apnea)    History of the same follow-up with pulmonology.  Patient seems adherent to therapy continue therapy as recommended follow-up with specialist as recommended      Asthma    History of the same follow-up pulmonology patient currently maintained on albuterol inhaler only.  Continue taking inhalers as prescribed continue following up with specialist as recommended        Musculoskeletal and Integument   Osteopenia    History of the same.  Patient's DEXA scan is up-to-date        Other   Anxiety and depression    History of the same.  Patient is having side effects of duloxetine 30 mg.  Will discontinue and start venlafaxine 37.5 mg daily.  Continue buspirone 5 mg twice daily exam taking to the therapist.  Patient denies HI/SI/AVH.      Relevant Medications   venlafaxine XR (EFFEXOR  XR) 37.5 MG 24 hr capsule   Preventative health care - Primary    Discussed age-appropriate immunizations and screening exams.  Did review patient's personal, surgical, social, family history.  Patient up-to-date on all age-appropriate vaccinations she would like.  Will update patient's flu  vaccine today.  She can get her shingles vaccine at local pharmacy.  Patient's overdue for CRC screening but is try to get her heart situated first.  Patient is due for mammogram order placed today she will call and get set up.  Patient is aged out for cervical cancer screening.  Patient is given information at discharge about preventative healthcare maintenance with anticipatory guidance.      History of stroke    History of same.  Patient is on anticoagulation and statin therapy      Obesity (BMI 30-39.9)    Patient has been working hard and lost approximate 20 pounds over the past year.  Started back on antidepressant and she is having difficulty losing weight continue working healthy lifestyle modifications.      Other Visit Diagnoses     Screening mammogram for breast cancer       Relevant Orders   MM 3D SCREENING MAMMOGRAM BILATERAL BREAST       Return in about 6 weeks (around 12/19/2022) for MDD/GAD/Med recheck .    Audria Nine, NP

## 2022-11-07 NOTE — Assessment & Plan Note (Signed)
Patient currently on anticoagulation followed by EP Dr. Steffanie Dunn.  Patient has a loop recorder.  Patient states possibility of getting a pacemaker but has a pending appointment with EP on December 2

## 2022-11-07 NOTE — Assessment & Plan Note (Signed)
History of same.  Patient is on anticoagulation and statin therapy

## 2022-11-07 NOTE — Assessment & Plan Note (Signed)
Patient currently contained on carvedilol, amlodipine, and furosemide.  Blood pressure slightly above goal.  She is working with cardiology to control.  Continue medication as prescribed

## 2022-11-14 ENCOUNTER — Encounter: Payer: Self-pay | Admitting: Behavioral Health

## 2022-11-14 ENCOUNTER — Ambulatory Visit: Payer: Medicare Other | Admitting: Behavioral Health

## 2022-11-14 DIAGNOSIS — F411 Generalized anxiety disorder: Secondary | ICD-10-CM

## 2022-11-14 DIAGNOSIS — F4323 Adjustment disorder with mixed anxiety and depressed mood: Secondary | ICD-10-CM | POA: Diagnosis not present

## 2022-11-14 NOTE — Progress Notes (Signed)
[facility] Counselor/Therapist Progress Note  Patient ID: Dwan Geraci, MRN: [id number],    Date: 11/14/2022  Time Spent: 51 minutes, 3:06 PM until 3:57 PM.   This session was held via video teletherapy. The patient consented to the video teletherapy and was located in her home during this session. She is aware it is the responsibility of the patient to secure confidentiality on her end of the session. The provider was in a private home office for the duration of this session.     Reported Symptoms: Anxiety, depression  Mental Status Exam: Appearance:  Well Groomed     Behavior: Appropriate  Motor: Normal  Speech/Language:  Clear and Coherent  Affect: Appropriate for  Mood: normal  Thought process: normal  Thought content:   WNL  Sensory/Perceptual disturbances:   WNL  Orientation: oriented to person, place, time/date, situation, day of week, month of year, and year  Attention: Good  Concentration: Good  Memory: WNL  Fund of knowledge:  Good  Insight:   Good  Judgment:  Good  Impulse Control: Good   Risk Assessment: Danger to Self:  No Self-injurious Behavior: No Danger to Others: No Duty to Warn:no Physical Aggression / Violence:No  Access to Firearms a concern: No  Gang Involvement:No   Subjective: The patient is feeling better physically not having as many heart palpitations.  She still does not have a lot of energy and they are looking at changing one of her medications which she will start next week because her daughter will be out of town this weekend and she does not want to do that while at home by herself.  She is looking forward to having a weekend of doing what she wants to do and depending on how she feels will spend some time out tomorrow.  I encouraged good self-care and fun.  For the most part her husband coming to the Halloween celebrations went okay.  She said there was some discord and initially but that she said very clear boundaries  which he respected.  We also processed some of her concerns about another family member.  The patient continues to practice good coping including some of the skills that we have looked at as well as addressing coping through spirituality Bible reading and prayer.  She and her daughter continue to visit churches.  She does contract for safety having no thoughts of hurting herself or anyone else.  Interventions: Cognitive Behavioral Therapy  Diagnosis: Adjustment disorder with mixed anxiety and depressed mood.  Plan: I will meet with the patient every 2 weeks via video session Tx. Plan:To use cognitive behavioral therapy principles as well as elements of dialectical behavior therapy.  Goals are to reduce anxiety and depression by at least 50% with a target date of November 06, 2022.  Goals are to have less sadness as indicated by PHQ-9 scores as well as patient report.  We also work on improving mood and return to a healthier level of functioning as defined by her goals for being happy, identify causes and process triggers for depressed mood.  We will use cognitive behavioral therapy to explore and replace unhealthy thoughts and behavior patterns contributing to depression.  I will continue to encourage shearing of feelings related to the causes and symptoms of depression as well as teach and encouraged use of coping skills for management of depressive symptoms.  We also will work to improve the patient's ability to manage anxiety symptoms and better handle stress, identify causes for  anxiety and explore ways for reduction of anxiety in addition to resolving conflicts contributing to anxiety and managing thoughts and worrisome thinking is contributing to anxiety.  Interventions will include providing education about anxiety, facilitate problem solution skills, teaching coping skills for managing anxiety such as grounding exercises, progressive muscle relaxation and cognitive re framing etc.  We will also use  cognitive behavioral therapy to identify and change anxiety provoking thoughts and behavior patterns as well as using DBT distress tolerance and mindfulness skills. I reviewed the treatment plan goals with the patient who agreed to continue with goals as stated above.New target date will be May 06, 2023. Progress: 35% [name], [facility]                 [name], [facility]               [name], [facility]               [name], [facility]               [name], [facility]               [name], [facility]               [name], [facility]               [name], [facility]               [name], [facility]               [name], [facility]               [name], [facility]               [name], [facility]               [name], [facility]               [name], [facility]               [name], [facility]               [name], [facility]               [name], [facility]               [name], [facility]

## 2022-11-18 ENCOUNTER — Encounter: Payer: Self-pay | Admitting: *Deleted

## 2022-11-18 NOTE — Progress Notes (Signed)
Carelink Summary Report / Loop Recorder 

## 2022-11-20 ENCOUNTER — Ambulatory Visit: Payer: Medicare Other | Admitting: Behavioral Health

## 2022-11-20 ENCOUNTER — Encounter: Payer: Self-pay | Admitting: Behavioral Health

## 2022-11-20 DIAGNOSIS — F4323 Adjustment disorder with mixed anxiety and depressed mood: Secondary | ICD-10-CM | POA: Diagnosis not present

## 2022-11-20 DIAGNOSIS — F411 Generalized anxiety disorder: Secondary | ICD-10-CM

## 2022-11-20 NOTE — Progress Notes (Signed)
[facility] Counselor/Therapist Progress Note  Patient ID: Keller Oehler, MRN: [id number],    Date: 11/20/2022  Time Spent: 50 minutes, 2 PM until 2:50 PM this session was held via video teletherapy. The patient consented to the video teletherapy and was located in her home during this session. She is aware it is the responsibility of the patient to secure confidentiality on her end of the session. The provider was in a private home office for the duration of this session.     Reported Symptoms: Anxiety, depression  Mental Status Exam: Appearance:  Well Groomed     Behavior: Appropriate  Motor: Normal  Speech/Language:  Clear and Coherent  Affect: Appropriate for  Mood: normal  Thought process: normal  Thought content:   WNL  Sensory/Perceptual disturbances:   WNL  Orientation: oriented to person, place, time/date, situation, day of week, month of year, and year  Attention: Good  Concentration: Good  Memory: WNL  Fund of knowledge:  Good  Insight:   Good  Judgment:  Good  Impulse Control: Good   Risk Assessment: Danger to Self:  No Self-injurious Behavior: No Danger to Others: No Duty to Warn:no Physical Aggression / Violence:No  Access to Firearms a concern: No  Gang Involvement:No   Subjective: The week and that the patient had was good because her grandson came and spent the day with her and they had a great time together.  She also was able to spend an entire day with her grandson that lives in the home with her as her daughter went away to visit friends.  The patient said both of those visits brought her a lot of joy.  She is trying to sneak out of the house a little bit even for a few hours.  The medication that she started on Monday has helped her breathing easier and have a little bit more stamina so she is trying to do a few things that she feels physically able.  There have been no palpitations or heart issues but she does follow up with her  doctor on December 2.  There have been some conversations between her husband and daughter but the patient says she feels good about the way she is handling those things.  She is sleeping fairly well.  She is not waking up angry.  There have been some revelations for her spiritually and that she recognized that Prudy Feeler was disrupting her relationship with God by the things that he was placing in her path including relationships or not working and is now seeing growth in her relationship with God.  She also continues to use coping skills to her benefit as she feels anxiety raise and has using coping skills to minimize her depression.  She does contract for safety having no thoughts of hurting herself or anyone else.  Interventions: Cognitive Behavioral Therapy  Diagnosis: Adjustment disorder with mixed anxiety and depressed mood.  Plan: I will meet with the patient every 2 weeks via video session Tx. Plan:To use cognitive behavioral therapy principles as well as elements of dialectical behavior therapy.  Goals are to reduce anxiety and depression by at least 50% with a target date of November 06, 2022.  Goals are to have less sadness as indicated by PHQ-9 scores as well as patient report.  We also work on improving mood and return to a healthier level of functioning as defined by her goals for being happy, identify causes and process triggers for depressed mood.  We will  use cognitive behavioral therapy to explore and replace unhealthy thoughts and behavior patterns contributing to depression.  I will continue to encourage shearing of feelings related to the causes and symptoms of depression as well as teach and encouraged use of coping skills for management of depressive symptoms.  We also will work to improve the patient's ability to manage anxiety symptoms and better handle stress, identify causes for anxiety and explore ways for reduction of anxiety in addition to resolving conflicts contributing to  anxiety and managing thoughts and worrisome thinking is contributing to anxiety.  Interventions will include providing education about anxiety, facilitate problem solution skills, teaching coping skills for managing anxiety such as grounding exercises, progressive muscle relaxation and cognitive re framing etc.  We will also use cognitive behavioral therapy to identify and change anxiety provoking thoughts and behavior patterns as well as using DBT distress tolerance and mindfulness skills. I reviewed the treatment plan goals with the patient who agreed to continue with goals as stated above.New target date will be May 06, 2023. Progress: 35% [name], [facility]                 [name], [facility]               [name], [facility]               [name], [facility]               [name], [facility]               [name], [facility]               [name], [facility]               [name], [facility]               [name], [facility]               [name], [facility]               [name], [facility]               [name], [facility]               [name], [facility]               [name], [facility]               [name], [facility]               [name], [facility]               [name], [facility]               [name], [facility]               [name], [facility]

## 2022-11-28 ENCOUNTER — Encounter: Payer: Self-pay | Admitting: Behavioral Health

## 2022-11-28 ENCOUNTER — Ambulatory Visit (INDEPENDENT_AMBULATORY_CARE_PROVIDER_SITE_OTHER): Payer: Medicare Other | Admitting: Behavioral Health

## 2022-11-28 DIAGNOSIS — F411 Generalized anxiety disorder: Secondary | ICD-10-CM

## 2022-11-28 DIAGNOSIS — F4323 Adjustment disorder with mixed anxiety and depressed mood: Secondary | ICD-10-CM | POA: Diagnosis not present

## 2022-11-28 NOTE — Progress Notes (Signed)
[facility] Counselor/Therapist Progress Note  Patient ID: Shannon [name], MRN: [id number],    Date: 11/28/2022  Time Spent: 50 minutes, 2 PM until 2:50 PM this session was held via video teletherapy. The patient consented to the video teletherapy and was located in her home during this session. She is aware it is the responsibility of the patient to secure confidentiality on her end of the session. The provider was in a private home office for the duration of this session.     Reported Symptoms: Anxiety, depression  Mental Status Exam: Appearance:  Well Groomed     Behavior: Appropriate  Motor: Normal  Speech/Language:  Clear and Coherent  Affect: Appropriate for  Mood: normal  Thought process: normal  Thought content:   WNL  Sensory/Perceptual disturbances:   WNL  Orientation: oriented to person, place, time/date, situation, day of week, month of year, and year  Attention: Good  Concentration: Good  Memory: WNL  Fund of knowledge:  Good  Insight:   Good  Judgment:  Good  Impulse Control: Good   Risk Assessment: Danger to Self:  No Self-injurious Behavior: No Danger to Others: No Duty to Warn:no Physical Aggression / Violence:No  Access to Firearms a concern: No  Gang Involvement:No   Subjective: The patient has not felt well the last couple of days.  She is not sure if it is related to the new medication or if she just has some sort of virus.  She has been on the new medication about a week and a half but is only felt that the last couple of days.  She is going to give it the weekend where she can rest and if it is not better reach out to her doctor.  Her family is celebrating Thanksgiving on Saturday because it is easier with everyone's work schedule.  Typically she is on the prepares all the food but physically does not feel like that now so she may cook the [place] and they may pick up sides and bring them home on Saturday.  Her husband will be  coming home but she says she is setting good boundaries in that relationship.  She is starting to see some changes in him as is her daughter but they know there is not much they can do that he is not already doing.  She reports that she is sleeping okay.  She is enjoying time with her grandson and keeping him but says that is tiring.  She is hoping the new medication will help reduce some of the fatigue and make breathing easier.  She follows up with a doctor in early December.  She does contract for safety having no thoughts of hurting herself or anyone else.  Interventions: Cognitive Behavioral Therapy  Diagnosis: Adjustment disorder with mixed anxiety and depressed mood.  Plan: I will meet with the patient every 2 weeks via video session Tx. Plan:To use cognitive behavioral therapy principles as well as elements of dialectical behavior therapy.  Goals are to reduce anxiety and depression by at least 50% with a target date of November 06, 2022.  Goals are to have less sadness as indicated by PHQ-9 scores as well as patient report.  We also work on improving mood and return to a healthier level of functioning as defined by her goals for being happy, identify causes and process triggers for depressed mood.  We will use cognitive behavioral therapy to explore and replace unhealthy thoughts and behavior patterns contributing to depression.  I will continue to encourage shearing of feelings related to the causes and symptoms of depression as well as teach and encouraged use of coping skills for management of depressive symptoms.  We also will work to improve the patient's ability to manage anxiety symptoms and better handle stress, identify causes for anxiety and explore ways for reduction of anxiety in addition to resolving conflicts contributing to anxiety and managing thoughts and worrisome thinking is contributing to anxiety.  Interventions will include providing education about anxiety, facilitate problem  solution skills, teaching coping skills for managing anxiety such as grounding exercises, progressive muscle relaxation and cognitive re framing etc.  We will also use cognitive behavioral therapy to identify and change anxiety provoking thoughts and behavior patterns as well as using DBT distress tolerance and mindfulness skills. I reviewed the treatment plan goals with the patient who agreed to continue with goals as stated above. Progress: 35%New target date will be May 06, 2023. [name], [facility]                 [name], [facility]               [name], [facility]               [name], [facility]               [name], [facility]               [name], [facility]               [name], [facility]               [name], [facility]               [name], [facility]               [name], [facility]               [name], [facility]               [name], [facility]               [name], [facility]               [name], [facility]               [name], [facility]               [name], [facility]               [name], [facility]               [name], [facility]               [name], [facility]               [name], [facility]

## 2022-12-01 ENCOUNTER — Other Ambulatory Visit: Payer: Self-pay

## 2022-12-01 ENCOUNTER — Ambulatory Visit: Payer: Medicare Other

## 2022-12-01 DIAGNOSIS — I480 Paroxysmal atrial fibrillation: Secondary | ICD-10-CM | POA: Diagnosis not present

## 2022-12-01 DIAGNOSIS — Z87891 Personal history of nicotine dependence: Secondary | ICD-10-CM

## 2022-12-01 DIAGNOSIS — Z122 Encounter for screening for malignant neoplasm of respiratory organs: Secondary | ICD-10-CM

## 2022-12-02 LAB — CUP PACEART REMOTE DEVICE CHECK
Date Time Interrogation Session: 20241125230111
Implantable Pulse Generator Implant Date: 20210301

## 2022-12-07 NOTE — Progress Notes (Unsigned)
Electrophysiology Office Follow up Visit Note:    Date:  12/08/2022   ID:  Shannon [name], DOB [birth year], MRN [id number]  PCP:  Eden Emms, NP  [facility] Cardiologist:  None  [facility] Electrophysiologist:  Lanier Prude, MD    Interval History:     Shannon [name] is a 72 y.o. female who presents for a follow up visit.   Shannon [name] presents for follow-up.  She last saw Shannon [name] in clinic October 14, 2022.  She has a history of atrial fibrillation, tachybradycardia syndrome, hypertension, obstructive sleep apnea, drug-induced lupus, stroke, anxiety.  She has had 3 prior catheter ablations.  She has failed Tikosyn and amiodarone in the past.  She takes Eliquis for stroke prophylaxis.  We received an alert from her loop recorder on October 27, 2022.  She had multiple symptom triggers for atrial fibrillation with rapid ventricular rates.  She also had a pause that lasted at least 4 seconds on Sept 18.  After the pause she was bradycardic for a few seconds.    Discussed the use of AI scribe software for clinical note transcription with the patient, who gave verbal consent to proceed.  History of Present Illness   The patient, with a history of recurrent atrial fibrillation (AFib), presents with recent episodes of AFib that occur approximately once a month, typically on Fridays. The episodes are variable in severity, with the most recent being mild and the one prior being severe, causing exhaustion for several days. The patient reports feeling a pause in her heartbeat during these episodes, describing it as a sudden loss of energy. She has a history of stroke and has undergone multiple ablations for AFib, which have not been successful. She is currently on Coreg 12.5mg , Eliquis, Effexor, and Buspirone. The patient also reports high blood pressure readings, typically around 150-170/80, despite a normal heart rate. She has noticed this increase since starting the  antidepressants Effexor and Buspirone.            Past medical, surgical, social and family history were reviewed.  ROS:   Please see the history of present illness.    All other systems reviewed and are negative.  EKGs/Labs/Other Studies Reviewed:    The following studies were reviewed today:  Loop recorder interrogations personally reviewed.  October 23, 2022 echo EF 60 to 65% RV function normal Moderately dilated left atrium Moderately dilated right atrium Mild to moderate MR  September 30, 2022 EKG shows sinus rhythm         Physical Exam:    VS:  BP 128/86 (BP Location: Left Arm, Patient Position: Sitting, Cuff Size: Large)   Pulse 64   Ht 5\' 7"  (1.702 m)   Wt 202 lb 9.6 oz (91.9 kg)   SpO2 99%   BMI 31.73 kg/m     Wt Readings from Last 3 Encounters:  12/08/22 202 lb 9.6 oz (91.9 kg)  11/07/22 201 lb (91.2 kg)  10/14/22 200 lb 9.6 oz (91 kg)     Physical Exam    GENERAL: No distress, sitting on bed CHEST: No increased work of breathing CARDIOVASCULAR: Regular rate and rhythm, no murmurs          ASSESSMENT:    1. Near syncope   2. Paroxysmal atrial fibrillation (HCC)   3. Chronic diastolic CHF (congestive heart failure) (HCC)   4. Tachycardia-bradycardia [facility])    PLAN:    In order of problems listed above:  Assessment and Plan  #  Tachybradycardia syndrome #Paroxysmal atrial fibrillation #HFpEF    Atrial Fibrillation Recurrent episodes, occurring approximately monthly, with variable symptom severity. Previous treatments including medications and ablations have not been successful. Discussed the possibility of a pacemaker, but given the infrequency of significant pauses and the primary symptoms being related to AFib, this is not currently recommended. -Increase Coreg to 18.75mg  twice daily to attempt to suppress AFib episodes. -Continue current medications including Eliquis. -Follow up in 3 months.  Hypertension Reports elevated  blood pressure readings at home, with systolic readings often in the 150-170 range. Currently on multiple medications including Effexor and Buspirone which may be contributing to elevated blood pressure. -iincrease coreg as above  Colonoscopy Due for colonoscopy, but has been delayed due to stroke and ongoing heart issues. -Can proceed with scheduling colonoscopy, holding Eliquis for two days prior to the procedure.  F/U with Shannon [name] in 3 months  Signed, Steffanie Dunn, MD, [facility] 12/08/2022 2:29 PM    Electrophysiology [facility]

## 2022-12-08 ENCOUNTER — Encounter: Payer: Self-pay | Admitting: Cardiology

## 2022-12-08 ENCOUNTER — Ambulatory Visit: Payer: Medicare Other | Attending: Cardiology | Admitting: Cardiology

## 2022-12-08 VITALS — BP 128/86 | HR 64 | Ht 67.0 in | Wt 202.6 lb

## 2022-12-08 DIAGNOSIS — I5032 Chronic diastolic (congestive) heart failure: Secondary | ICD-10-CM

## 2022-12-08 DIAGNOSIS — I495 Sick sinus syndrome: Secondary | ICD-10-CM | POA: Diagnosis not present

## 2022-12-08 DIAGNOSIS — R55 Syncope and collapse: Secondary | ICD-10-CM | POA: Diagnosis not present

## 2022-12-08 DIAGNOSIS — I480 Paroxysmal atrial fibrillation: Secondary | ICD-10-CM | POA: Diagnosis not present

## 2022-12-08 MED ORDER — CARVEDILOL 12.5 MG PO TABS: 18.7500 mg | ORAL_TABLET | Freq: Two times a day (BID) | ORAL | 3 refills | Status: DC

## 2022-12-08 NOTE — Patient Instructions (Signed)
Medication Instructions:  Your physician has recommended you make the following change in your medication:  1) INCREASE Coreg (carvedilol) to 18.75 mg twice daily   *If you need a refill on your cardiac medications before your next appointment, please call your pharmacy*  Follow-Up: At [facility], you and your health needs are our priority.  As part of our continuing mission to provide you with exceptional heart care, we have created designated Provider Care Teams.  These Care Teams include your primary Cardiologist (physician) and Advanced Practice Providers (APPs -  Physician Assistants and Nurse Practitioners) who all work together to provide you with the care you need, when you need it.    Your next appointment:   3 months  Provider:   Sherie Don, NP

## 2022-12-09 ENCOUNTER — Ambulatory Visit: Payer: Medicare Other | Admitting: Acute Care

## 2022-12-09 ENCOUNTER — Encounter: Payer: Self-pay | Admitting: Acute Care

## 2022-12-09 DIAGNOSIS — Z87891 Personal history of nicotine dependence: Secondary | ICD-10-CM | POA: Diagnosis not present

## 2022-12-09 NOTE — Patient Instructions (Signed)

## 2022-12-09 NOTE — Progress Notes (Signed)
Virtual Visit via Telephone Note  I connected with Shannon [name] on 12/09/22 at  3:00 PM EST by telephone and verified that I am speaking with the correct person using two identifiers.  Location: Patient:  At home Provider: [address] [place], [place], [address]    I discussed the limitations, risks, security and privacy concerns of performing an evaluation and management service by telephone and the availability of in person appointments. I also discussed with the patient that there may be a patient responsible charge related to this service. The patient expressed understanding and agreed to proceed.   Shared Decision Making Visit Lung Cancer Screening Program [phone])   Eligibility: Age 17 y.o. Pack Years Smoking History Calculation 51 pack year smoking history (# packs/per year x # years smoked) Recent History of coughing up blood  no Unexplained weight loss? no ( >Than 15 pounds within the last 6 months ) Prior History Lung / other cancer no (Diagnosis within the last 5 years already requiring surveillance chest CT Scans). Smoking Status Former Smoker Former Smokers: Years since quit: 8 years  Quit Date: 2016  Visit Components: Discussion included one or more decision making aids. yes Discussion included risk/benefits of screening. yes Discussion included potential follow up diagnostic testing for abnormal scans. yes Discussion included meaning and risk of over diagnosis. yes Discussion included meaning and risk of False Positives. yes Discussion included meaning of total radiation exposure. yes  Counseling Included: Importance of adherence to annual lung cancer LDCT screening. yes Impact of comorbidities on ability to participate in the program. yes Ability and willingness to under diagnostic treatment. yes  Smoking Cessation Counseling: Current Smokers:  Discussed importance of smoking cessation. yes Information about tobacco cessation classes and  interventions provided to patient. yes Patient provided with "ticket" for LDCT Scan. yes Symptomatic Patient. no  Counseling NA Diagnosis Code: Tobacco Use Z72.0 Asymptomatic Patient yes  Counseling (Intermediate counseling: > three minutes counseling) B2841 Former Smokers:  Discussed the importance of maintaining cigarette abstinence. yes Diagnosis Code: Personal History of Nicotine Dependence. L24.401 Information about tobacco cessation classes and interventions provided to patient. Yes Patient provided with "ticket" for LDCT Scan. yes Written Order for Lung Cancer Screening with LDCT placed in Epic. Yes (CT Chest Lung Cancer Screening Low Dose W/O CM) [id number] Z12.2-Screening of respiratory organs Z87.891-Personal history of nicotine dependence   Bevelyn Ngo, NP 12/09/2022

## 2022-12-12 ENCOUNTER — Ambulatory Visit: Payer: Medicare Other | Admitting: Behavioral Health

## 2022-12-12 ENCOUNTER — Other Ambulatory Visit (HOSPITAL_COMMUNITY): Payer: Self-pay | Admitting: *Deleted

## 2022-12-12 DIAGNOSIS — F411 Generalized anxiety disorder: Secondary | ICD-10-CM

## 2022-12-12 DIAGNOSIS — I639 Cerebral infarction, unspecified: Secondary | ICD-10-CM

## 2022-12-12 DIAGNOSIS — F4323 Adjustment disorder with mixed anxiety and depressed mood: Secondary | ICD-10-CM | POA: Diagnosis not present

## 2022-12-12 MED ORDER — APIXABAN 5 MG PO TABS
5.0000 mg | ORAL_TABLET | Freq: Two times a day (BID) | ORAL | 3 refills | Status: DC
Start: 2022-12-12 — End: 2023-03-10

## 2022-12-14 ENCOUNTER — Encounter: Payer: Self-pay | Admitting: Behavioral Health

## 2022-12-14 NOTE — Progress Notes (Signed)
[facility] Counselor/Therapist Progress Note  Patient ID: Shannon [name], MRN: [id number],    Date: 12/12/22  Time Spent: 56 minutes, 3 PM until 3:56 PM this session was held via video teletherapy. The patient consented to the video teletherapy and was located in her home during this session. She is aware it is the responsibility of the patient to secure confidentiality on her end of the session. The provider was in a private home office for the duration of this session.     Reported Symptoms: Anxiety, depression  Mental Status Exam: Appearance:  Well Groomed     Behavior: Appropriate  Motor: Normal  Speech/Language:  Clear and Coherent  Affect: Appropriate for  Mood: normal  Thought process: normal  Thought content:   WNL  Sensory/Perceptual disturbances:   WNL  Orientation: oriented to person, place, time/date, situation, day of week, month of year, and year  Attention: Good  Concentration: Good  Memory: WNL  Fund of knowledge:  Good  Insight:   Good  Judgment:  Good  Impulse Control: Good   Risk Assessment: Danger to Self:  No Self-injurious Behavior: No Danger to Others: No Duty to Warn:no Physical Aggression / Violence:No  Access to Firearms a concern: No  Gang Involvement:No   Subjective: The patient did discuss some relationship issues saying that recently especially over Thanksgiving she had to set some boundaries which she did not want to have to set up.  She feels like she has set reasonable boundaries but they were not necessarily honored so she stayed firm with the boundaries.  She is not sure what they are going to do for Christmas and waits to see what is going to happen between now and then.  She is feeling better.  She met with a doctor who did not think that she needed a pacemaker at this point in time.  The arrhythmia has slowed down and she thinks that she is having a little more energy and is breathing better.  She is optimistic.  She  had some sort of virus over the past week so was starting to feel better but is going to use the weekend to catch up on her rest.  She reports that her anxiety level was up significantly with the family issue but she feels she manages it well. She does contract for safety having no thoughts of hurting herself or anyone else.  Interventions: Cognitive Behavioral Therapy  Diagnosis: Adjustment disorder with mixed anxiety and depressed mood.  Plan: I will meet with the patient every 2 weeks via video session TX. Plan:To use cognitive behavioral therapy principles as well as elements of dialectical behavior therapy.  Goals are to reduce anxiety and depression by at least 50% with a target date of November 06, 2022.  Goals are to have less sadness as indicated by PHQ-9 scores as well as patient report.  We also work on improving mood and return to a healthier level of functioning as defined by her goals for being happy, identify causes and process triggers for depressed mood.  We will use cognitive behavioral therapy to explore and replace unhealthy thoughts and behavior patterns contributing to depression.  I will continue to encourage shearing of feelings related to the causes and symptoms of depression as well as teach and encouraged use of coping skills for management of depressive symptoms.  We also will work to improve the patient's ability to manage anxiety symptoms and better handle stress, identify causes for anxiety and explore ways  for reduction of anxiety in addition to resolving conflicts contributing to anxiety and managing thoughts and worrisome thinking is contributing to anxiety.  Interventions will include providing education about anxiety, facilitate problem solution skills, teaching coping skills for managing anxiety such as grounding exercises, progressive muscle relaxation and cognitive re framing etc.  We will also use cognitive behavioral therapy to identify and change anxiety provoking  thoughts and behavior patterns as well as using DBT distress tolerance and mindfulness skills. I reviewed the treatment plan goals with the patient who agreed to continue with goals as stated above. Progress: 35%New target date will be May 06, 2023. [name], [facility]                 [name], [facility]               [name], [facility]               [name], [facility]               [name], [facility]               [name], [facility]               [name], [facility]               [name], [facility]               [name], [facility]               [name], [facility]               [name], [facility]               [name], [facility]               [name], [facility]               [name], [facility]               [name], [facility]               [name], [facility]               [name], [facility]               [name], [facility]               [name], [facility]               [name], [facility]               [name], [facility]

## 2022-12-18 ENCOUNTER — Ambulatory Visit: Payer: Medicare Other

## 2022-12-18 ENCOUNTER — Ambulatory Visit: Payer: Medicare Other | Admitting: Nurse Practitioner

## 2022-12-18 VITALS — BP 124/72 | HR 67 | Temp 98.3°F | Ht 67.0 in | Wt 203.2 lb

## 2022-12-18 DIAGNOSIS — J3489 Other specified disorders of nose and nasal sinuses: Secondary | ICD-10-CM

## 2022-12-18 DIAGNOSIS — F322 Major depressive disorder, single episode, severe without psychotic features: Secondary | ICD-10-CM | POA: Diagnosis not present

## 2022-12-18 DIAGNOSIS — R0982 Postnasal drip: Secondary | ICD-10-CM

## 2022-12-18 DIAGNOSIS — F411 Generalized anxiety disorder: Secondary | ICD-10-CM

## 2022-12-18 HISTORY — DX: Postnasal drip: R09.82

## 2022-12-18 HISTORY — DX: Other specified disorders of nose and nasal sinuses: J34.89

## 2022-12-18 MED ORDER — FLUTICASONE PROPIONATE 50 MCG/ACT NA SUSP
2.0000 | Freq: Every day | NASAL | 0 refills | Status: DC
Start: 2022-12-18 — End: 2023-01-09

## 2022-12-18 NOTE — Assessment & Plan Note (Signed)
Flonase nasal spray 2 sprays each nostril daily

## 2022-12-18 NOTE — Assessment & Plan Note (Signed)
Patient currently maintained on venlafaxine 37.5 mg daily.  She was having some fatigue encourage patient to switch to nighttime dosing.  Patient still currently following with counseling and doing therapy.  Continue medication as prescribed

## 2022-12-18 NOTE — Assessment & Plan Note (Signed)
Effexor 37.5 mg daily patient's mood is doing well per her report.  Continue medication as prescribed try evening dosing to see the hospital fatigue

## 2022-12-18 NOTE — Patient Instructions (Addendum)
Nice to see you today Try taking the effexor at night Follow up with me in 3 months, sooner if you need me   If you do not continue to improve reach out to me and I will send in an antibiotic

## 2022-12-18 NOTE — Progress Notes (Signed)
Established Patient Office Visit  Subjective   Patient ID: Shannon [name], female    DOB: [birth year]  Age: 72 y.o. MRN: [id number]  Chief Complaint  Patient presents with   Medication Management    Pt complains of feeling tired all the time while taking MDD/GAD medication. Also thinks tiredness could be due to the coreg medication.     HPI  MDD/GAD: Patient is currently enrolled in counseling.  She was on duloxetine 30 mg but started having side effects we discontinued duloxetine therapy and started venlafaxine 37.5 mg daily.  Patient currently also maintained on buspirone 5 mg twice daily.  Patient is here for follow-up  States that her blood pressure was higher with the duloxetine. States that it has settled down since. States that over the past fews weeks it has settled down. States last week she went to cards and her BP was good  Stes tah he did increase her beta blocker and she has been tired from that. States thatshe was having some tiredness with the busapr and the effexor. State sthat she started to feel better then they increased her heart medicine. States that her mood has been good on the [employer].   States that she has been dealing with her husband less and less. She has been placing barriers that seems to help.   Sinus: statse that the sympotms started apprxo 2 weeks ago. States some phlegm when she wake up and is improving.    Review of Systems  Constitutional:  Negative for chills and fever.  HENT:  Positive for sinus pain.   Respiratory:  Positive for cough and shortness of breath.   Cardiovascular:  Negative for chest pain.  Psychiatric/Behavioral:  Negative for hallucinations and suicidal ideas. The patient is not nervous/anxious. Insomnia: still waking up at night but not waking up angry.      Objective:     BP 124/72   Pulse 67   Temp 98.3 F (36.8 C) (Oral)   Ht 5\' 7"  (1.702 m)   Wt 203 lb 3.2 oz (92.2 kg)   SpO2 96%   BMI 31.83 kg/m  BP  Readings from Last 3 Encounters:  12/18/22 124/72  12/08/22 128/86  11/07/22 (!) 140/76   Wt Readings from Last 3 Encounters:  12/18/22 203 lb 3.2 oz (92.2 kg)  12/08/22 202 lb 9.6 oz (91.9 kg)  11/07/22 201 lb (91.2 kg)   SpO2 Readings from Last 3 Encounters:  12/18/22 96%  12/08/22 99%  11/07/22 94%      Physical Exam Vitals and nursing note reviewed.  Constitutional:      Appearance: Normal appearance.  HENT:     Right Ear: Tympanic membrane, ear canal and external ear normal.     Left Ear: Tympanic membrane, ear canal and external ear normal.     Nose:     Right Sinus: Maxillary sinus tenderness present. No frontal sinus tenderness.     Left Sinus: Maxillary sinus tenderness present. No frontal sinus tenderness.     Mouth/Throat:     Mouth: Mucous membranes are moist.     Pharynx: Oropharynx is clear.  Cardiovascular:     Rate and Rhythm: Normal rate and regular rhythm.     Heart sounds: Normal heart sounds.  Pulmonary:     Effort: Pulmonary effort is normal.     Breath sounds: Normal breath sounds.  Lymphadenopathy:     Cervical: No cervical adenopathy.  Neurological:     Mental Status: She is  alert.      No results found for any visits on 12/18/22.    The ASCVD Risk score (Arnett DK, et al., 2019) failed to calculate for the following reasons:   Risk score cannot be calculated because patient has a medical history suggesting prior/existing ASCVD    Assessment & Plan:   Problem List Items Addressed This Visit       Other   Current severe episode of major depressive disorder without psychotic features without prior episode [facility])   Patient currently maintained on venlafaxine 37.5 mg daily.  She was having some fatigue encourage patient to switch to nighttime dosing.  Patient still currently following with counseling and doing therapy.  Continue medication as prescribed      GAD (generalized anxiety disorder)   Effexor 37.5 mg daily patient's mood is  doing well per her report.  Continue medication as prescribed try evening dosing to see the hospital fatigue      PND (post-nasal drip)   Flonase nasal spray 2 sprays each nostril daily      Relevant Medications   fluticasone (FLONASE) 50 MCG/ACT nasal spray   Sinus pressure - Primary   Flonase 2 sprays each nostril daily.  If she does not continue to improve antibiotics for maxillary sinusitis will need to be called in.       Return in about 3 months (around 03/18/2023) for MDD/GAD follow up .    Audria Nine, NP

## 2022-12-18 NOTE — Assessment & Plan Note (Signed)
Flonase 2 sprays each nostril daily.  If she does not continue to improve antibiotics for maxillary sinusitis will need to be called in.

## 2022-12-19 ENCOUNTER — Ambulatory Visit: Payer: Medicare Other | Admitting: Behavioral Health

## 2022-12-25 ENCOUNTER — Ambulatory Visit
Admission: RE | Admit: 2022-12-25 | Discharge: 2022-12-25 | Disposition: A | Discharge status: Home / Self Care | Payer: Medicare Other | Source: Ambulatory Visit | Attending: Nurse Practitioner | Admitting: Nurse Practitioner

## 2022-12-25 DIAGNOSIS — Z87891 Personal history of nicotine dependence: Secondary | ICD-10-CM | POA: Diagnosis not present

## 2022-12-25 DIAGNOSIS — Z122 Encounter for screening for malignant neoplasm of respiratory organs: Secondary | ICD-10-CM

## 2022-12-26 ENCOUNTER — Ambulatory Visit: Payer: Medicare Other | Admitting: Behavioral Health

## 2022-12-26 ENCOUNTER — Encounter: Payer: Self-pay | Admitting: Behavioral Health

## 2022-12-26 DIAGNOSIS — F4323 Adjustment disorder with mixed anxiety and depressed mood: Secondary | ICD-10-CM | POA: Diagnosis not present

## 2022-12-26 DIAGNOSIS — F411 Generalized anxiety disorder: Secondary | ICD-10-CM

## 2022-12-26 NOTE — Progress Notes (Signed)
[name] Counselor/Therapist Progress Note  Patient ID: Shannon [name], MRN: [id number],    Date: 12/26/22  Time Spent: 56 minutes, 3 PM until 3:56 PM this session was held via video teletherapy. The patient consented to the video teletherapy and was located in her home during this session. She is aware it is the responsibility of the patient to secure confidentiality on her end of the session. The provider was in a private home office for the duration of this session.     Reported Symptoms: Anxiety, depression  Mental Status Exam: Appearance:  Well Groomed     Behavior: Appropriate  Motor: Normal  Speech/Language:  Clear and Coherent  Affect: Appropriate for  Mood: normal  Thought process: normal  Thought content:   WNL  Sensory/Perceptual disturbances:   WNL  Orientation: oriented to person, place, time/date, situation, day of week, month of year, and year  Attention: Good  Concentration: Good  Memory: WNL  Fund of knowledge:  Good  Insight:   Good  Judgment:  Good  Impulse Control: Good   Risk Assessment: Danger to Self:  No Self-injurious Behavior: No Danger to Others: No Duty to Warn:no Physical Aggression / Violence:No  Access to Firearms a concern: No  Gang Involvement:No   Subjective: The patient is feeling better physically.  She is feeling that she has more stamina and is breathing easier.  She has not had any A-fib events since our last session.  She did get a chest x-ray because she has had some congestion and 1 to make sure nothing else is going on.  For the most part things have been fairly calm between she and her husband.  She and her children and husband will celebrate Christmas this weekend and her husband will stay there from Sunday until at least the day after Christmas if things go smoothly.  She has very clear boundaries in mind and will enforce them if there is anything concerning to her.  She has not felt like visiting churches  but it is trying to have individual quiet time/very time and would like to over Christmas her first of the year start visiting churches again.  She is practicing good self-care. Interventions: Cognitive Behavioral Therapy  Diagnosis: Adjustment disorder with mixed anxiety and depressed mood.  Plan: I will meet with the patient every 2 weeks via video session TX. Plan:To use cognitive behavioral therapy principles as well as elements of dialectical behavior therapy.  Goals are to reduce anxiety and depression by at least 50% with a target date of November 06, 2022.  Goals are to have less sadness as indicated by PHQ-9 scores as well as patient report.  We also work on improving mood and return to a healthier level of functioning as defined by her goals for being happy, identify causes and process triggers for depressed mood.  We will use cognitive behavioral therapy to explore and replace unhealthy thoughts and behavior patterns contributing to depression.  I will continue to encourage shearing of feelings related to the causes and symptoms of depression as well as teach and encouraged use of coping skills for management of depressive symptoms.  We also will work to improve the patient's ability to manage anxiety symptoms and better handle stress, identify causes for anxiety and explore ways for reduction of anxiety in addition to resolving conflicts contributing to anxiety and managing thoughts and worrisome thinking is contributing to anxiety.  Interventions will include providing education about anxiety, facilitate problem solution skills, teaching  coping skills for managing anxiety such as grounding exercises, progressive muscle relaxation and cognitive re framing etc.  We will also use cognitive behavioral therapy to identify and change anxiety provoking thoughts and behavior patterns as well as using DBT distress tolerance and mindfulness skills. I reviewed the treatment plan goals with the patient who  agreed to continue with goals as stated above. Progress: 35%New target date will be May 06, 2023. [name], [facility]                 [name], [facility]               [name], [facility]               [name], [facility]               [name], [facility]               [name], [facility]               [name], [facility]               [name], [facility]               [name], [facility]               [name], [facility]               [name], [facility]               [name], [name]               [name], [facility]               [name], [facility]               [name], [facility]               [name], [facility]               [name], [facility]               [name], [facility]               [name], [facility]               [name], [facility]               [name], [facility]               [name], [facility]

## 2022-12-29 ENCOUNTER — Other Ambulatory Visit: Payer: Self-pay | Admitting: Nurse Practitioner

## 2022-12-29 ENCOUNTER — Other Ambulatory Visit (HOSPITAL_COMMUNITY): Payer: Self-pay | Admitting: Cardiology

## 2022-12-29 DIAGNOSIS — F411 Generalized anxiety disorder: Secondary | ICD-10-CM

## 2022-12-29 DIAGNOSIS — F32A Depression, unspecified: Secondary | ICD-10-CM

## 2023-01-01 ENCOUNTER — Ambulatory Visit (INDEPENDENT_AMBULATORY_CARE_PROVIDER_SITE_OTHER): Payer: Medicare Other

## 2023-01-01 DIAGNOSIS — I495 Sick sinus syndrome: Secondary | ICD-10-CM | POA: Diagnosis not present

## 2023-01-01 LAB — CUP PACEART REMOTE DEVICE CHECK
Date Time Interrogation Session: 20241225230318
Implantable Pulse Generator Implant Date: 20210301

## 2023-01-01 NOTE — Addendum Note (Signed)
Addended by: Geralyn Flash D on: 01/01/2023 12:19 PM   Modules accepted: Orders

## 2023-01-01 NOTE — Progress Notes (Signed)
Carelink Summary Report / Loop Recorder 

## 2023-01-05 ENCOUNTER — Other Ambulatory Visit (HOSPITAL_COMMUNITY): Payer: Self-pay | Admitting: Cardiology

## 2023-01-08 ENCOUNTER — Telehealth (HOSPITAL_COMMUNITY): Payer: Self-pay | Admitting: *Deleted

## 2023-01-08 ENCOUNTER — Encounter: Payer: Self-pay | Admitting: Nurse Practitioner

## 2023-01-08 ENCOUNTER — Other Ambulatory Visit: Payer: Self-pay

## 2023-01-08 DIAGNOSIS — Z87891 Personal history of nicotine dependence: Secondary | ICD-10-CM

## 2023-01-08 DIAGNOSIS — Z122 Encounter for screening for malignant neoplasm of respiratory organs: Secondary | ICD-10-CM

## 2023-01-08 DIAGNOSIS — Z1211 Encounter for screening for malignant neoplasm of colon: Secondary | ICD-10-CM

## 2023-01-08 MED ORDER — AMLODIPINE BESYLATE 5 MG PO TABS: 5.0000 mg | ORAL_TABLET | Freq: Every day | ORAL | 3 refills | Status: DC

## 2023-01-08 NOTE — Telephone Encounter (Signed)
 Disp Refills Start End   amLODipine  (NORVASC ) 5 MG tablet 90 tablet 3 01/06/2023 --   Sig - Route: TAKE 1 TABLET BY MOUTH DAILY - Oral   Sent to pharmacy as: amLODipine  (NORVASC ) 5 MG tablet   E-Prescribing Status: Receipt confirmed by pharmacy (01/06/2023  8:55 AM EST)

## 2023-01-08 NOTE — Telephone Encounter (Signed)
 Pt needs amlodipine 5mg  once a day refilled to [facility] 90 day supply. Pt follows with Dr. Jacki Cones NP will forward to [facility] office for refills.

## 2023-01-09 ENCOUNTER — Other Ambulatory Visit: Payer: Self-pay | Admitting: Nurse Practitioner

## 2023-01-09 ENCOUNTER — Encounter: Payer: Self-pay | Admitting: Behavioral Health

## 2023-01-09 ENCOUNTER — Ambulatory Visit (INDEPENDENT_AMBULATORY_CARE_PROVIDER_SITE_OTHER): Payer: Medicare Other | Admitting: Behavioral Health

## 2023-01-09 DIAGNOSIS — F4323 Adjustment disorder with mixed anxiety and depressed mood: Secondary | ICD-10-CM | POA: Diagnosis not present

## 2023-01-09 DIAGNOSIS — F411 Generalized anxiety disorder: Secondary | ICD-10-CM

## 2023-01-09 DIAGNOSIS — R0982 Postnasal drip: Secondary | ICD-10-CM

## 2023-01-09 NOTE — Progress Notes (Signed)
 [facility] Counselor/Therapist Progress Note  Patient ID: Shannon [name], MRN: [id number],    Date:   Time Spent: 58 minutes, 1 PM until 1:58 PM PM this session was held via video teletherapy. The patient consented to the video teletherapy and was located in her home during this session. She is aware it is the responsibility of the patient to secure confidentiality on her end of the session. The provider was in a private home office for the duration of this session.     Reported Symptoms: Anxiety, depression  Mental Status Exam: Appearance:  Well Groomed     Behavior: Appropriate  Motor: Normal  Speech/Language:  Clear and Coherent  Affect: Appropriate for  Mood: normal  Thought process: normal  Thought content:   WNL  Sensory/Perceptual disturbances:   WNL  Orientation: oriented to person, place, time/date, situation, day of week, month of year, and year  Attention: Good  Concentration: Good  Memory: WNL  Fund of knowledge:  Good  Insight:   Good  Judgment:  Good  Impulse Control: Good   Risk Assessment: Danger to Self:  No Self-injurious Behavior: No Danger to Others: No Duty to Warn:no Physical Aggression / Violence:No  Access to Firearms a concern: No  Gang Involvement:No   Subjective: The patient said that Christmas went well.  Unfortunately her son and some of his family got sick so they had to change the day they got together and it went well when they did including her husband coming down.  He is currently here so he can spend some more time with family and she says he has done well and she has responded well.  Physically she is tired from being so busy over the holidays but feels better otherwise.  There is still attempting to adjust her medication for the A-fib.  She did set a few small A-fib incidents but they have not been as intense or lasted as long and she is thankful.  She has been cleared to exercise and has not started yet but wants to  find something to do as best she can inside.  She and her daughter and grandson also visited a church on Christmas Eve and plan to make that more consistent now that she is.  She feels that she is grieving well and is still setting clear boundaries and that has helped her anxiety level.  Interventions: Cognitive Behavioral Therapy  Diagnosis: Adjustment disorder with mixed anxiety and depressed mood.  Plan: I will meet with the patient every 2 weeks via video session TX. Plan:To use cognitive behavioral therapy principles as well as elements of dialectical behavior therapy.  Goals are to reduce anxiety and depression by at least 50% with a target date of November 06, 2022.  Goals are to have less sadness as indicated by PHQ-9 scores as well as patient report.  We also work on improving mood and return to a healthier level of functioning as defined by her goals for being happy, identify causes and process triggers for depressed mood.  We will use cognitive behavioral therapy to explore and replace unhealthy thoughts and behavior patterns contributing to depression.  I will continue to encourage shearing of feelings related to the causes and symptoms of depression as well as teach and encouraged use of coping skills for management of depressive symptoms.  We also will work to improve the patient's ability to manage anxiety symptoms and better handle stress, identify causes for anxiety and explore ways for reduction of  anxiety in addition to resolving conflicts contributing to anxiety and managing thoughts and worrisome thinking is contributing to anxiety.  Interventions will include providing education about anxiety, facilitate problem solution skills, teaching coping skills for managing anxiety such as grounding exercises, progressive muscle relaxation and cognitive re framing etc.  We will also use cognitive behavioral therapy to identify and change anxiety provoking thoughts and behavior patterns as well as  using DBT distress tolerance and mindfulness skills. I reviewed the treatment plan goals with the patient who agreed to continue with goals as stated above. Progress: 35%New target date will be May 06, 2023. Lorrene CHRISTELLA Hasten, [facility]                 Lorrene CHRISTELLA Hasten, [facility]               Lorrene CHRISTELLA Hasten, [facility]               Lorrene CHRISTELLA Hasten, [facility]               Lorrene CHRISTELLA Hasten, [facility]               Lorrene CHRISTELLA Hasten, [facility]               Lorrene CHRISTELLA Hasten, [facility]               Lorrene CHRISTELLA Hasten, [facility]               Lorrene CHRISTELLA Hasten, [facility]               Lorrene CHRISTELLA Hasten, [facility]               Lorrene CHRISTELLA Hasten, [facility]               Lorrene CHRISTELLA Hasten, [facility]               Lorrene CHRISTELLA Hasten, [facility]               Lorrene CHRISTELLA Hasten, [facility]               Lorrene CHRISTELLA Hasten, [facility]               Lorrene CHRISTELLA Hasten, [facility]               Lorrene CHRISTELLA Hasten, [facility]               Lorrene CHRISTELLA Hasten, [facility]               Lorrene CHRISTELLA Hasten, [facility]               Lorrene CHRISTELLA Hasten, [facility]               Lorrene CHRISTELLA Hasten, [facility]               Lorrene CHRISTELLA Hasten, [facility]               Lorrene CHRISTELLA Hasten, [facility]

## 2023-01-12 ENCOUNTER — Other Ambulatory Visit: Payer: Self-pay

## 2023-01-12 ENCOUNTER — Telehealth: Payer: Self-pay

## 2023-01-12 DIAGNOSIS — Z860100 Personal history of colon polyps, unspecified: Secondary | ICD-10-CM

## 2023-01-12 MED ORDER — NA SULFATE-K SULFATE-MG SULF 17.5-3.13-1.6 GM/177ML PO SOLN: 1.0000 | Freq: Once | ORAL | 0 refills | Status: AC

## 2023-01-12 NOTE — Telephone Encounter (Signed)
Patient called in to schedule her colonoscopy.

## 2023-01-12 NOTE — Progress Notes (Signed)
 Clearance and Blood Thinner Advice Noted from 12/08/23 Cardiology Visit w/Dr. Cindie:  Colonoscopy Due for colonoscopy, but has been delayed due to stroke and ongoing heart issues. -Can proceed with scheduling colonoscopy, holding Eliquis  for two days prior to the procedure.    Gastroenterology Pre-Procedure Review  Request Date: 01/30/23 Requesting Physician: Dr. Jinny  PATIENT REVIEW QUESTIONS: The patient responded to the following health history questions as indicated:    1. Are you having any GI issues? Sometimes cramps after meals but nothing bothersome that she would like to schedule an office visit to address 2. Do you have a personal history of Polyps? yes (last colonoscopy performed by Dr. Leigh at [facility] 02/23/18 multiple polyps noted recommended repeat in 3 years ) 3. Do you have a family history of Colon Cancer or Polyps? no 4. Diabetes Mellitus? no 5. Joint replacements in the past 12 months?no 6. Major health problems in the past 3 months?no 7. Any artificial heart valves, MVP, or defibrillator?yes (cup paceart.  Clearance granted prior to pt scheduling)    MEDICATIONS & ALLERGIES:    Patient reports the following regarding taking any anticoagulation/antiplatelet therapy:   Plavix, Coumadin, Eliquis , Xarelto , Lovenox , Pradaxa, Brilinta, or Effient? yes (Eliquis  advice was provided to pt during her previous cardiology appt to stop Eliquis  2 days prior to colonoscopy) Aspirin ? no  Patient confirms/reports the following medications:  Current Outpatient Medications  Medication Sig Dispense Refill   acetaminophen  (TYLENOL ) 500 MG tablet Take 1,000 mg by mouth every 6 (six) hours as needed for moderate pain or headache.     albuterol  (VENTOLIN  HFA) 108 (90 Base) MCG/ACT inhaler Inhale 1-2 puffs into the lungs every 6 (six) hours as needed for wheezing or shortness of breath. 8 g 2   amLODipine  (NORVASC ) 5 MG tablet Take 1 tablet (5 mg total) by mouth daily. 90 tablet  3   apixaban  (ELIQUIS ) 5 MG TABS tablet Take 1 tablet (5 mg total) by mouth 2 (two) times daily. 180 tablet 3   busPIRone  (BUSPAR ) 5 MG tablet TAKE 1 TABLET BY MOUTH TWICE  DAILY 200 tablet 0   carvedilol  (COREG ) 12.5 MG tablet Take 1.5 tablets (18.75 mg total) by mouth 2 (two) times daily. 270 tablet 3   Cholecalciferol  (VITAMIN D ) 50 MCG (2000 UT) CAPS Take 2,000 Units by mouth in the morning.     fluticasone  (FLONASE ) 50 MCG/ACT nasal spray SPRAY 2 SPRAYS INTO EACH NOSTRIL EVERY DAY 48 mL 1   furosemide  (LASIX ) 20 MG tablet TAKE 1 TABLET BY MOUTH DAILY 100 tablet 1   Hydrocortisone (CORTIZONE-10 EX) Apply 1 application  topically as needed (skin irritation/itching).     levalbuterol  (XOPENEX  HFA) 45 MCG/ACT inhaler Inhale 2 puffs into the lungs every 6 (six) hours as needed for wheezing or shortness of breath. 15 g 1   loratadine (CLARITIN) 10 MG tablet Take 10 mg by mouth daily as needed for allergies.     LORazepam  (ATIVAN ) 0.5 MG tablet Take 1 tablet (0.5 mg total) by mouth every 8 (eight) hours as needed for anxiety. 60 tablet 2   meclizine  (ANTIVERT ) 12.5 MG tablet Take 1 tablet (12.5 mg total) by mouth 3 (three) times daily as needed for dizziness. 21 tablet 0   potassium chloride  (KLOR-CON ) 10 MEQ tablet TAKE 1 TABLET BY MOUTH DAILY 100 tablet 1   simvastatin  (ZOCOR ) 20 MG tablet TAKE 1 TABLET BY MOUTH DAILY 80 tablet 3   triamcinolone (NASACORT) 55 MCG/ACT AERO nasal inhaler Place 1 spray  into the nose daily as needed (allergies).     venlafaxine  XR (EFFEXOR -XR) 37.5 MG 24 hr capsule TAKE 1 CAPSULE BY MOUTH DAILY  WITH BREAKFAST 90 capsule 0   No current facility-administered medications for this visit.    Patient confirms/reports the following allergies:  Allergies  Allergen Reactions   Omega-3 Other (See Comments)    Arthritis   Sulfur Nausea And Vomiting   Ace Inhibitors Cough   Amiodarone  Other (See Comments)    Intolerance multiple side effects   Diclofenac Sodium Other  (See Comments)    Hypersensitivity   Elemental Sulfur Nausea And Vomiting   Hctz [Hydrochlorothiazide ] Other (See Comments)    Caused drug-induced LUPUS   Oxycodone Other (See Comments)    Hallucinations   Prednisone  Other (See Comments)    Made patient very aggressive   Sulfa Antibiotics Nausea And Vomiting   Voltaren [Diclofenac Sodium] Other (See Comments)    Made patient become aggressive    No orders of the defined types were placed in this encounter.   AUTHORIZATION INFORMATION Primary Insurance: 1D#: Group #:  Secondary Insurance: 1D#: Group #:  SCHEDULE INFORMATION: Date: 01/30/23 Time: Location: [facility]

## 2023-01-12 NOTE — Telephone Encounter (Signed)
 Clearance and Blood Thinner Advice Noted from 12/08/23 Cardiology Visit w/Dr. Cindie:  Colonoscopy Due for colonoscopy, but has been delayed due to stroke and ongoing heart issues. -Can proceed with scheduling colonoscopy, holding Eliquis  for two days prior to the procedure.     Gastroenterology Pre-Procedure Review   Request Date: 01/30/23 Requesting Physician: Dr. Jinny   PATIENT REVIEW QUESTIONS: The patient responded to the following health history questions as indicated:     1. Are you having any GI issues? Sometimes cramps after meals but nothing bothersome that she would like to schedule an office visit to address 2. Do you have a personal history of Polyps? yes (last colonoscopy performed by Dr. Leigh at [facility] 02/23/18 multiple polyps noted recommended repeat in 3 years ) 3. Do you have a family history of Colon Cancer or Polyps? no 4. Diabetes Mellitus? no 5. Joint replacements in the past 12 months?no 6. Major health problems in the past 3 months?no 7. Any artificial heart valves, MVP, or defibrillator?yes (cup paceart.  Clearance granted prior to pt scheduling)    MEDICATIONS & ALLERGIES:    Patient reports the following regarding taking any anticoagulation/antiplatelet therapy:   Plavix, Coumadin, Eliquis , Xarelto , Lovenox , Pradaxa, Brilinta, or Effient? yes (Eliquis  advice was provided to pt during her previous cardiology appt to stop Eliquis  2 days prior to colonoscopy) Aspirin ? no   Patient confirms/reports the following medications:        Current Outpatient Medications  Medication Sig Dispense Refill   acetaminophen  (TYLENOL ) 500 MG tablet Take 1,000 mg by mouth every 6 (six) hours as needed for moderate pain or headache.       albuterol  (VENTOLIN  HFA) 108 (90 Base) MCG/ACT inhaler Inhale 1-2 puffs into the lungs every 6 (six) hours as needed for wheezing or shortness of breath. 8 g 2   amLODipine  (NORVASC ) 5 MG tablet Take 1 tablet (5 mg total) by mouth  daily. 90 tablet 3   apixaban  (ELIQUIS ) 5 MG TABS tablet Take 1 tablet (5 mg total) by mouth 2 (two) times daily. 180 tablet 3   busPIRone  (BUSPAR ) 5 MG tablet TAKE 1 TABLET BY MOUTH TWICE  DAILY 200 tablet 0   carvedilol  (COREG ) 12.5 MG tablet Take 1.5 tablets (18.75 mg total) by mouth 2 (two) times daily. 270 tablet 3   Cholecalciferol  (VITAMIN D ) 50 MCG (2000 UT) CAPS Take 2,000 Units by mouth in the morning.       fluticasone  (FLONASE ) 50 MCG/ACT nasal spray SPRAY 2 SPRAYS INTO EACH NOSTRIL EVERY DAY 48 mL 1   furosemide  (LASIX ) 20 MG tablet TAKE 1 TABLET BY MOUTH DAILY 100 tablet 1   Hydrocortisone (CORTIZONE-10 EX) Apply 1 application  topically as needed (skin irritation/itching).       levalbuterol  (XOPENEX  HFA) 45 MCG/ACT inhaler Inhale 2 puffs into the lungs every 6 (six) hours as needed for wheezing or shortness of breath. 15 g 1   loratadine (CLARITIN) 10 MG tablet Take 10 mg by mouth daily as needed for allergies.       LORazepam  (ATIVAN ) 0.5 MG tablet Take 1 tablet (0.5 mg total) by mouth every 8 (eight) hours as needed for anxiety. 60 tablet 2   meclizine  (ANTIVERT ) 12.5 MG tablet Take 1 tablet (12.5 mg total) by mouth 3 (three) times daily as needed for dizziness. 21 tablet 0   potassium chloride  (KLOR-CON ) 10 MEQ tablet TAKE 1 TABLET BY MOUTH DAILY 100 tablet 1   simvastatin  (ZOCOR ) 20 MG tablet TAKE 1  TABLET BY MOUTH DAILY 80 tablet 3   triamcinolone (NASACORT) 55 MCG/ACT AERO nasal inhaler Place 1 spray into the nose daily as needed (allergies).       venlafaxine  XR (EFFEXOR -XR) 37.5 MG 24 hr capsule TAKE 1 CAPSULE BY MOUTH DAILY  WITH BREAKFAST 90 capsule 0      No current facility-administered medications for this visit.        Patient confirms/reports the following allergies:  Allergies       Allergies  Allergen Reactions   Omega-3 Other (See Comments)      Arthritis   Sulfur Nausea And Vomiting   Ace Inhibitors Cough   Amiodarone  Other (See Comments)       Intolerance multiple side effects   Diclofenac Sodium Other (See Comments)      Hypersensitivity   Elemental Sulfur Nausea And Vomiting   Hctz [Hydrochlorothiazide ] Other (See Comments)      Caused drug-induced LUPUS   Oxycodone Other (See Comments)      Hallucinations   Prednisone  Other (See Comments)      Made patient very aggressive   Sulfa Antibiotics Nausea And Vomiting   Voltaren [Diclofenac Sodium] Other (See Comments)      Made patient become aggressive        No orders of the defined types were placed in this encounter.     AUTHORIZATION INFORMATION Primary Insurance: 1D#: Group #:   Secondary Insurance: 1D#: Group #:   SCHEDULE INFORMATION: Date: 01/30/23 Time: Location: [facility]

## 2023-01-22 ENCOUNTER — Other Ambulatory Visit: Payer: Self-pay | Admitting: Gastroenterology

## 2023-01-23 ENCOUNTER — Encounter: Payer: Self-pay | Admitting: Behavioral Health

## 2023-01-23 ENCOUNTER — Ambulatory Visit (INDEPENDENT_AMBULATORY_CARE_PROVIDER_SITE_OTHER): Payer: Medicare Other | Admitting: Behavioral Health

## 2023-01-23 DIAGNOSIS — F411 Generalized anxiety disorder: Secondary | ICD-10-CM

## 2023-01-23 DIAGNOSIS — F4323 Adjustment disorder with mixed anxiety and depressed mood: Secondary | ICD-10-CM | POA: Diagnosis not present

## 2023-01-23 NOTE — Progress Notes (Signed)
[name] Counselor/Therap[facility] Progress Note  Patient ID: Shannon [name], MRN: [id number],    Date: January 23, 2023  Time Spent: 58 minutes, 3 PM until 3:58 PM this session was held via video teletherapy. The patient consented to the video teletherapy and was located in her home during this session. She is aware it is the responsibility of the patient to secure confidentiality on her end of the session. The provider was in a private home office for the duration of this session.     Reported Symptoms: Anxiety, depression  Mental Status Exam: Appearance:  Well Groomed     Behavior: Appropriate  Motor: Normal  Speech/Language:  Clear and Coherent  Affect: Appropriate for  Mood: normal  Thought process: normal  Thought content:   WNL  Sensory/Perceptual disturbances:   WNL  Orientation: oriented to person, place, time/date, situation, day of week, month of year, and year  Attention: Good  Concentration: Good  Memory: WNL  Fund of knowledge:  Good  Insight:   Good  Judgment:  Good  Impulse Control: Good   Risk Assessment: Danger to Self:  No Self-injurious Behavior: No Danger to Others: No Duty to Warn:no Physical Aggression / Violence:No  Access to Firearms a concern: No  Gang Involvement:No   Subjective: The patient has been very busy since our last session.  She feels like a lot is going for the most part she feels that she is handling it well.  She still would like to look at a change in the medication she is on saying she still gets tired easily but the A-fib is better.  She is having a couple of tests this weekend and that she will out for the most part looked at about everything medically that she can.  Her son remarked that she looked like she was having acknowledges that even though there are still some things that she is working through she is genuinely very happy.  She feels good about the boundaries that she has set with family members.  Her  relationship with her brother is strengthening.  She is enjoying time with her grandson that she lives with as well as her daughter and with her son and his family as often as possible.  She feels that she is setting clear boundaries with her husband.  She and her daughter continue to visit churches and she knows the spiritual part is something that she wants to keep working on. Interventions: Cognitive Behavioral Therapy  Diagnosis: Adjustment disorder with mixed anxiety and depressed mood.  Plan: I will meet with the patient every 2 weeks via video session TX. Plan:To use cognitive behavioral therapy principles as well as elements of dialectical behavior therapy.  Goals are to reduce anxiety and depression by at least 50% with a target date of November 06, 2022.  Goals are to have less sadness as indicated by PHQ-9 scores as well as patient report.  We also work on improving mood and return to a healthier level of functioning as defined by her goals for being happy, identify causes and process triggers for depressed mood.  We will use cognitive behavioral therapy to explore and replace unhealthy thoughts and behavior patterns contributing to depression.  I will continue to encourage shearing of feelings related to the causes and symptoms of depression as well as teach and encouraged use of coping skills for management of depressive symptoms.  We also will work to improve the patient's ability to manage anxiety symptoms and better  handle stress, identify causes for anxiety and explore ways for reduction of anxiety in addition to resolving conflicts contributing to anxiety and managing thoughts and worrisome thinking is contributing to anxiety.  Interventions will include providing education about anxiety, facilitate problem solution skills, teaching coping skills for managing anxiety such as grounding exercises, progressive muscle relaxation and cognitive re framing etc.  We will also use cognitive behavioral  therapy to identify and change anxiety provoking thoughts and behavior patterns as well as using DBT distress tolerance and mindfulness skills. I reviewed the treatment plan goals with the patient who agreed to continue with goals as stated above. Progress: 35%New target date will be May 06, 2023. [name], [facility]                 [name], [facility]               [name], [facility]               [name], [facility]               [name], [facility]               [name], [facility]               [name], [facility]               [name], [facility]               [name], [facility]               [name], [facility]               [name], [facility]               [name], [facility]               [name], [facility]               [name], [facility]               [name], [facility]               [name], [facility]               [name], [facility]               [name], [facility]               [name], [facility]               [name], [facility]               [name], [facility]               [name], [facility]               [name], [facility]               [name], [facility]

## 2023-01-27 ENCOUNTER — Ambulatory Visit
Admission: RE | Admit: 2023-01-27 | Discharge: 2023-01-27 | Disposition: A | Discharge status: Home / Self Care | Payer: Medicare Other | Source: Ambulatory Visit | Attending: Nurse Practitioner

## 2023-01-27 DIAGNOSIS — Z1231 Encounter for screening mammogram for malignant neoplasm of breast: Secondary | ICD-10-CM

## 2023-01-28 ENCOUNTER — Telehealth: Payer: Self-pay | Admitting: Student in an Organized Health Care Education/Training Program

## 2023-01-28 NOTE — Telephone Encounter (Signed)
Patient called to report that she is in Afib and wants to know what to do with her Eliquis. Her symptoms started this evening around dinner time. Has palpitations and pain in her neck which she says that she always gets with her Afib. Hrs between 107-130. No chest pain, SOB or dizziness. She is scheduled to get a colonoscopy on 1/24 and was instructed to hold her Eliquis 2 days prior. She missed her AM dose today. She has had a stroke before and is fearful of missing further Eliquis doses. I instructed the patient to present to a local ED for evaluation and to take her Eliquis since she is higher risk of CVA and is concerned. She declined presenting to the ED because "she wants to see how her HR responds to her evening carvediolol dose first" despite my recommendation. She will however, take her PM dose of Eliquis and notify her GI and EP doctors of her current situation tomorrow.

## 2023-01-29 ENCOUNTER — Telehealth: Payer: Self-pay

## 2023-01-29 ENCOUNTER — Encounter: Payer: Self-pay | Admitting: Nurse Practitioner

## 2023-01-29 NOTE — Telephone Encounter (Signed)
The patient called in because she had to take her (Eliquis) last night. She went into afib last night and she had to reach out to the on-call doctor about her going into afib. The provider advised her to take her (Eliquis) to avoid a stroke. She said that she made the provider aware of her procedure on 01/30/23. He informs her that he wants to prevent another stroke and to call our office for the next steps. As of now she is not in afib, but she is worried that it will come back. Please advised.

## 2023-01-30 ENCOUNTER — Telehealth: Payer: Self-pay

## 2023-01-30 ENCOUNTER — Ambulatory Visit (INDEPENDENT_AMBULATORY_CARE_PROVIDER_SITE_OTHER): Payer: Medicare Other | Admitting: Behavioral Health

## 2023-01-30 ENCOUNTER — Ambulatory Visit: Admission: RE | Admit: 2023-01-30 | Payer: Medicare Other | Source: Home / Self Care | Admitting: Gastroenterology

## 2023-01-30 DIAGNOSIS — F411 Generalized anxiety disorder: Secondary | ICD-10-CM

## 2023-01-30 DIAGNOSIS — F4323 Adjustment disorder with mixed anxiety and depressed mood: Secondary | ICD-10-CM | POA: Diagnosis not present

## 2023-01-30 SURGERY — COLONOSCOPY WITH PROPOFOL
Anesthesia: General

## 2023-01-30 NOTE — Telephone Encounter (Signed)
Alert received from [employer] for Device alert for 2 pause episodes, 3 & 4sec per device 1/23 @ 07:11.  EGM c/w brief conversion pause followed by junctional beats. 5 new AF episodes, longest duration 7hrs , not always good rate control, burden 3.4%, Eliquis per PA report. 1 symptom activation's, "Short of breath, Heart pounding, Heart fluttering  Active  High stress all day. Tried to stay calm. Worrying. No relief for stress. One stressful event after another AF with RVR Route to triage for pause and symptom.  Patient reports she was asleep during pauses. Continue to monitor d/t nocturnal. Please see below.      Patient reports of having "very stressful week" which she is associating her symptoms to. States she has skipped 1 dose of Eliquis d/t upcoming procedure. Patient is now back on her Eliquis. Compliant with coreg 18.75mg  BID. Patient has seen AF clinic in the past. Will forward to AF clinic to follow up.

## 2023-02-01 ENCOUNTER — Encounter: Payer: Self-pay | Admitting: Behavioral Health

## 2023-02-01 NOTE — Progress Notes (Signed)
[name] Counselor/Therapist Progress Note  Patient ID: Shannon [name], MRN: [id number],    Date: January 30, 2023  Time Spent: 52 minutes, 3 PM until 3:52 PM this session was held via video teletherapy. The patient consented to the video teletherapy and was located in her home during this session. She is aware it is the responsibility of the patient to secure confidentiality on her end of the session. The provider was in a private home office for the duration of this session.     Reported Symptoms: Anxiety, depression  Mental Status Exam: Appearance:  Well Groomed     Behavior: Appropriate  Motor: Normal  Speech/Language:  Clear and Coherent  Affect: Appropriate for  Mood: normal  Thought process: normal  Thought content:   WNL  Sensory/Perceptual disturbances:   WNL  Orientation: oriented to person, place, time/date, situation, day of week, month of year, and year  Attention: Good  Concentration: Good  Memory: WNL  Fund of knowledge:  Good  Insight:   Good  Judgment:  Good  Impulse Control: Good   Risk Assessment: Danger to Self:  No Self-injurious Behavior: No Danger to Others: No Duty to Warn:no Physical Aggression / Violence:No  Access to Firearms a concern: No  Gang Involvement:No   Subjective: It has been a stressful week for the patient.  Her grandson whom lives in the house with her stability is sick but is getting better.  Because of the cold water pipes froze but no one will water so the pipes froze in the yard and he cannot do much until the weather warms up some.  They had to go to a hotel for a night to wash dishes take showers.  They are doing the best they can now but she said that led to her having A-fib which thankfully only lasted about 7 hours and was not as intense this time.  She and her daughter work really well together thankfully.  She also has used her coping skills including relying on her faith to help with reducing the  anxiety.  It has gotten better as the weeks, but they are looking forward to some schedule stabilization.  Interventions: Cognitive Behavioral Therapy  Diagnosis: Adjustment disorder with mixed anxiety and depressed mood.  Plan: I will meet with the patient every 2 weeks via video session TX. Plan:To use cognitive behavioral therapy principles as well as elements of dialectical behavior therapy.  Goals are to reduce anxiety and depression by at least 50% with a target date of November 06, 2022.  Goals are to have less sadness as indicated by PHQ-9 scores as well as patient report.  We also work on improving mood and return to a healthier level of functioning as defined by her goals for being happy, identify causes and process triggers for depressed mood.  We will use cognitive behavioral therapy to explore and replace unhealthy thoughts and behavior patterns contributing to depression.  I will continue to encourage shearing of feelings related to the causes and symptoms of depression as well as teach and encouraged use of coping skills for management of depressive symptoms.  We also will work to improve the patient's ability to manage anxiety symptoms and better handle stress, identify causes for anxiety and explore ways for reduction of anxiety in addition to resolving conflicts contributing to anxiety and managing thoughts and worrisome thinking is contributing to anxiety.  Interventions will include providing education about anxiety, facilitate problem solution skills, teaching coping skills for managing  anxiety such as grounding exercises, progressive muscle relaxation and cognitive re framing etc.  We will also use cognitive behavioral therapy to identify and change anxiety provoking thoughts and behavior patterns as well as using DBT distress tolerance and mindfulness skills. I reviewed the treatment plan goals with the patient who agreed to continue with goals as stated above. Progress: 35%New target  date will be May 06, 2023. [name], [facility]

## 2023-02-02 ENCOUNTER — Ambulatory Visit: Payer: Medicare Other

## 2023-02-02 DIAGNOSIS — R55 Syncope and collapse: Secondary | ICD-10-CM

## 2023-02-03 ENCOUNTER — Encounter: Payer: Self-pay | Admitting: Cardiology

## 2023-02-03 ENCOUNTER — Other Ambulatory Visit: Payer: Self-pay

## 2023-02-03 ENCOUNTER — Emergency Department (HOSPITAL_BASED_OUTPATIENT_CLINIC_OR_DEPARTMENT_OTHER): Payer: Medicare Other

## 2023-02-03 ENCOUNTER — Emergency Department (HOSPITAL_BASED_OUTPATIENT_CLINIC_OR_DEPARTMENT_OTHER)
Admission: EM | Admit: 2023-02-03 | Discharge: 2023-02-03 | Disposition: A | Discharge status: Home / Self Care | Payer: Medicare Other | Attending: Emergency Medicine | Admitting: Emergency Medicine

## 2023-02-03 DIAGNOSIS — I159 Secondary hypertension, unspecified: Secondary | ICD-10-CM | POA: Diagnosis not present

## 2023-02-03 DIAGNOSIS — R079 Chest pain, unspecified: Secondary | ICD-10-CM | POA: Diagnosis not present

## 2023-02-03 DIAGNOSIS — I10 Essential (primary) hypertension: Secondary | ICD-10-CM | POA: Insufficient documentation

## 2023-02-03 LAB — CUP PACEART REMOTE DEVICE CHECK
Date Time Interrogation Session: 20250126230206
Implantable Pulse Generator Implant Date: 20210301

## 2023-02-03 LAB — CBC WITH DIFFERENTIAL/PLATELET
Abs Immature Granulocytes: 0.02 10*3/uL (ref 0.00–0.07)
Basophils Absolute: 0.1 10*3/uL (ref 0.0–0.1)
Basophils Relative: 1 %
Eosinophils Absolute: 0.4 10*3/uL (ref 0.0–0.5)
Eosinophils Relative: 4 %
HCT: 41.1 % (ref 36.0–46.0)
Hemoglobin: 13 g/dL (ref 12.0–15.0)
Immature Granulocytes: 0 %
Lymphocytes Relative: 22 %
Lymphs Abs: 1.8 10*3/uL (ref 0.7–4.0)
MCH: 28.7 pg (ref 26.0–34.0)
MCHC: 31.6 g/dL (ref 30.0–36.0)
MCV: 90.7 fL (ref 80.0–100.0)
Monocytes Absolute: 0.8 10*3/uL (ref 0.1–1.0)
Monocytes Relative: 9 %
Neutro Abs: 5.5 10*3/uL (ref 1.7–7.7)
Neutrophils Relative %: 64 %
Platelets: 341 10*3/uL (ref 150–400)
RBC: 4.53 MIL/uL (ref 3.87–5.11)
RDW: 14.4 % (ref 11.5–15.5)
WBC: 8.6 10*3/uL (ref 4.0–10.5)
nRBC: 0 % (ref 0.0–0.2)

## 2023-02-03 LAB — COMPREHENSIVE METABOLIC PANEL
ALT: 14 U/L (ref 0–44)
AST: 19 U/L (ref 15–41)
Albumin: 4.7 g/dL (ref 3.5–5.0)
Alkaline Phosphatase: 72 U/L (ref 38–126)
Anion gap: 8 (ref 5–15)
BUN: 11 mg/dL (ref 8–23)
CO2: 32 mmol/L (ref 22–32)
Calcium: 9.7 mg/dL (ref 8.9–10.3)
Chloride: 98 mmol/L (ref 98–111)
Creatinine, Ser: 0.67 mg/dL (ref 0.44–1.00)
GFR, Estimated: >60 mL/min (ref >60)
Glucose, Bld: 94 mg/dL (ref 70–99)
Potassium: 3.3 mmol/L — ABNORMAL LOW (ref 3.5–5.1)
Sodium: 138 mmol/L (ref 135–145)
Total Bilirubin: 0.5 mg/dL (ref 0.0–1.2)
Total Protein: 8.7 g/dL — ABNORMAL HIGH (ref 6.5–8.1)

## 2023-02-03 LAB — TROPONIN I (HIGH SENSITIVITY)
Troponin I (High Sensitivity): 36 ng/L — ABNORMAL HIGH (ref <18)
Troponin I (High Sensitivity): 40 ng/L — ABNORMAL HIGH (ref <18)

## 2023-02-03 NOTE — ED Triage Notes (Signed)
Pt c/o chest tightness that started around 4-5 pm while sitting down after a stressful afternoon. CP associated with increased SOB. Pain has been ongoing since. Reports taking her BP at that time. Is concern for a at home BP 197/93.

## 2023-02-03 NOTE — ED Notes (Signed)
RN reviewed discharge instructions with pt. Pt verbalized understanding and had no further questions. VSS upon discharge.

## 2023-02-03 NOTE — Progress Notes (Unsigned)
Cardiology Office Note Date:  02/04/2023  Patient ID:  Shannon [name], Shannon [name] 21-Feb-1950, MRN [id number] PCP:  Eden Emms, NP  Cardiologist:  None Electrophysiologist: Hillis Range > Lanier Prude, MD     Chief Complaint: afib w RVR  History of Present Illness: Shannon [name] is a 73 y.o. female with PMH notable for afib, HTN, orthostatic hypotension, OSA on CPAP, CHF, drug-induced lupus, CVA, anxiety; seen today for Lanier Prude, MD for routine electrophysiology followup.  She is s/p AF ablation 12/2021 with R-sided PVI and posterior wall. This was 3rd ablation. She has previously failed tikosyn and amiodarone. Her [name] alerted office on 10/27/2022 for multiple symptom triggers during AFib w RVR episode. She also had a 4 sec pause after this episode.  She saw Dr. Lalla Brothers 12/2022 where she was continuing to have recurrent Afib episodes of varying severity but no further pauses. Her coreg was increased to 18.75 BID.  Earlier this week she noticed that her BP was lower than normal after taking her morning medications. She was also having some mild LH and dizziness. She increased her salt intake. She also was very busy and stressed during this day. The following day (yesterday), her BP was very high throughout the entire day, as high as 190 systolic. She also was having chest discomfort and some neck pain that she was worried was her AFib starting. When her BP continued to be elevated throughout the day, she presented to ER yesterday evening for HTN urgency, BP 180/82 in ER. Trops negative. No infectious cause, recommended to see PCP for BP med mgmt. Usually her BP at home runs 130-140 systolic. Rarely 150,  but she gets very nervous when she sees these readings.  On follow-up today, she feels very well. She slept great last night. She diligently uses CPAP nightly, always gets a "smiley face" in the morning. She has noticed that over the past several months she does not sleep  well at night, is concerned that her effexor is causing bad dreams. She wishes to stop her effexor.    She diligently takes eliquis BID, no missed doses, no bleeding concerns.    Device Information: MDT [name], imp 03/2019; dx afib   AFib hx Diagnosed 2017 PVI ablation 03/25/2016 PVI ablation 01/31/20 STROKE on compliant and appropriately dosed Xarelto 02/2021 > Eliquis PVI ablation 12/13/21   AAD Hx Flecainide 2017 >> stopped May 2018 2/2 c/o fatigue  >> resumed Jan 2020 with recurrent AF episodes >> stopped April 2022, stopped daily use d/t ablation >> PRN >> off Tikosyn started Dec 2023 >> stopped during load 2/2 QT prolongation Amiodarone started Dec 2023 > intolerant w/ blurred vision, fatigue, SOB, mood swings   ROS:  Please see the history of present illness. All other systems are reviewed and otherwise negative.   PHYSICAL EXAM:  VS:  BP 128/78 (BP Location: Left Arm, Patient Position: Sitting, Cuff Size: Large)   Pulse 72   Ht 5\' 7"  (1.702 m)   Wt 197 lb (89.4 kg)   SpO2 98%   BMI 30.85 kg/m  BMI: Body mass index is 30.85 kg/m.  Wt Readings from Last 3 Encounters:  02/04/23 197 lb (89.4 kg)  02/03/23 203 lb (92.1 kg)  12/18/22 203 lb 3.2 oz (92.2 kg)    GEN- The patient is well appearing, alert and oriented x 3 today.   Lungs- Clear to ausculation bilaterally, normal work of breathing.  Heart- Regular rate and rhythm, no murmurs, rubs  or gallops Extremities- No peripheral edema, warm, dry Skin-  [name] insertion site well-healed   1/28 remote interrogation revieweddone today and reviewed by myself:  Battery good Rare parox afib noted.  Post-conversion pause up to 4 second.    EKG is not ordered. Personal review of EKG from  02/03/2023  shows:  NSR, rate 78bpm       Recent Labs: 10/14/2022: Magnesium 2.4 02/03/2023: ALT 14; BUN 11; Creatinine, Ser 0.67; Hemoglobin 13.0; Platelets 341; Potassium 3.3; Sodium 138  10/15/2022: Chol/HDL Ratio 2.3; Cholesterol, Total  139; HDL 60; LDL Chol Calc (NIH) 62; Triglycerides 93   Estimated Creatinine Clearance: 74 mL/min (by C-G formula based on SCr of 0.67 mg/dL).   Additional studies reviewed include: Previous EP, cardiology notes.   TTE, 10/23/2022  1. Left ventricular ejection fraction, by estimation, is 60 to 65%. The left ventricle has normal function. The left ventricle has no regional wall motion abnormalities. There is mild concentric left ventricular hypertrophy. Left ventricular diastolic  parameters are indeterminate.   2. Right ventricular systolic function is normal. The right ventricular size is normal.   3. Left atrial size was moderately dilated.   4. Right atrial size was moderately dilated.   5. The mitral valve is normal in structure. Mild to moderate mitral valve regurgitation. No evidence of mitral stenosis.   6. The aortic valve is tricuspid. There is mild calcification of the aortic valve. Aortic valve regurgitation is not visualized. Aortic valve sclerosis/calcification is present, without any evidence of aortic stenosis.   7. The inferior vena cava is normal in size with greater than 50% respiratory variability, suggesting right atrial pressure of 3 mmHg.   TTE, 02/07/2021  1. Left ventricular ejection fraction, by estimation, is 55 to 60%. The left ventricle has normal function. The left ventricle has no regional wall motion abnormalities. The left ventricular internal cavity size was mildly dilated. Left ventricular diastolic parameters were normal.   2. Right ventricular systolic function is normal. The right ventricular size is normal. There is normal pulmonary artery systolic pressure.   3. The mitral valve is normal in structure. Mild mitral valve regurgitation.   4. The aortic valve is normal in structure. Aortic valve regurgitation is not visualized.   5. The inferior vena cava is normal in size with greater than 50% respiratory variability, suggesting right atrial pressure of 3  mmHg.   CT cardiac morph w pulm vein, 01/24/2020 1.  No LAA thrombus  2.  Moderate bi atrial enlargement  3.  Normal PV anatomy measurements above  4.  Dilated main pulmonary artery 4.1 cm  5.  No obvious PFO / ASD  6.  Normal aortic root 3.7 cm with aortic atherosclerosis  7.  Coronary calcium score 562 which is 94 th percentile for age/sex   ASSESSMENT AND PLAN:  #) parox Afib #) post-conversion pause #) [name] in situ #) hypokalemia S/p AF ablation x 3 No AAD options at this time Continues to have paroxysm of AFib but overall burden is low Brief post-conversion pauses up to 4 sec in duration, patient is symptomatic during this. No syncope Continue 18.75mg  coreg BID Hypokalemic yesterday in ER, will increase K supplement to daily   #) Hypercoag d/t parox afib CHA2DS2-VASc Score = 5 [CHF History: 0, HTN History: 1, Diabetes History: 0, Stroke History: 2, Vascular Disease History: 0, Age Score: 1, Gender Score: 1].  Therefore, the patient's annual risk of stroke is 7.2 %. Stroke ppx - eliquis 5mg   BID, appropriately dosed No bleeding concerns  #) HTN #) anxiety Overall good BP control Continue coreg as above, 5mg  amlodipine Continue 20mg  lasix daily Reassurance provided that if she checks BP and it is slightly elevated, do not recheck until the following day as long as she remains asymptomatic Recommended she discuss weaning effexor with PCP as patient does not wish to be on this medication any longer  #) OSA on CPAP Follows regularly with [facility] with excellent CPAP compliance Query whether settings should be adjusted given post-conversion AFib pauses in the overnight hours       Current medicines are reviewed at length with the patient today.   The patient does not have concerns regarding her medicines.  The following changes were made today:   INCREASE klor con to daily  Labs/ tests ordered today include:  No orders of the defined types were placed in  this encounter.    Disposition: Follow up with Dr. Lalla Brothers or EP APP in 6 months  Continue monthly [name] remotes    Signed, Sherie Don, NP  02/04/23  10:09 AM  Electrophysiology [facility]

## 2023-02-03 NOTE — ED Provider Notes (Signed)
[place] EMERGENCY DEPARTMENT AT [facility] Provider Note   CSN: [id number] Arrival date & time: 02/03/23  1911     History Chief Complaint  Patient presents with   Hypertension   Chest Pain    HPI Shannon [name] is a 73 y.o. female presenting for elevated blood pressure readings and chest pressure.  Denies any pain. Had low BP yesterday so she ate more salty foods than normal.  Palpitations today. Recently started on an SNRI. Denies fevers chills nausea vomiting syncope shortness of breath at this time. Patient's recorded medical, surgical, social, medication list and allergies were reviewed in the Snapshot window as part of the initial history.   Review of Systems   Review of Systems  Constitutional:  Negative for chills and fever.  HENT:  Negative for ear pain and sore throat.   Eyes:  Negative for pain and visual disturbance.  Respiratory:  Negative for cough and shortness of breath.   Cardiovascular:  Negative for chest pain and palpitations.  Gastrointestinal:  Negative for abdominal pain and vomiting.  Genitourinary:  Negative for dysuria and hematuria.  Musculoskeletal:  Negative for arthralgias and back pain.  Skin:  Negative for color change and rash.  Neurological:  Negative for seizures and syncope.  All other systems reviewed and are negative.   Physical Exam Updated Vital Signs BP (!) 188/82   Pulse 68   Temp 98.2 F (36.8 C) (Oral)   Resp 17   Ht 5\' 7"  (1.702 m)   Wt 92.1 kg   SpO2 98%   BMI 31.79 kg/m  Physical Exam Vitals and nursing note reviewed.  Constitutional:      General: She is not in acute distress.    Appearance: She is well-developed.  HENT:     Head: Normocephalic and atraumatic.  Eyes:     Conjunctiva/sclera: Conjunctivae normal.  Cardiovascular:     Rate and Rhythm: Normal rate and regular rhythm.     Heart sounds: No murmur heard. Pulmonary:     Effort: Pulmonary effort is normal. No respiratory distress.      Breath sounds: Normal breath sounds.  Abdominal:     General: There is no distension.     Palpations: Abdomen is soft.     Tenderness: There is no abdominal tenderness. There is no right CVA tenderness or left CVA tenderness.  Musculoskeletal:        General: No swelling or tenderness. Normal range of motion.     Cervical back: Neck supple.  Skin:    General: Skin is warm and dry.  Neurological:     General: No focal deficit present.     Mental Status: She is alert and oriented to person, place, and time. Mental status is at baseline.     Cranial Nerves: No cranial nerve deficit.      ED Course/ Medical Decision Making/ A&P    Procedures Procedures   Medications Ordered in ED Medications - No data to display Medical Decision Making:   Shannon [name] is a 73 y.o. female who presented to the ED today with elevated blood pressure detailed above.    Patient placed on continuous vitals and telemetry monitoring while in ED which was reviewed periodically.  Complete initial physical exam performed, notably the patient  was HDS in NAD.    Reviewed and confirmed nursing documentation for past medical history, family history, social history.    Initial Assessment:   With the patient's presentation of elevated blood pressure  readings, most likely diagnosis is hypertensive urgency. Other diagnoses associated with hypertensive emergency were considered including (but not limited to) intracranial hemorrhage, acute renal artery stenosis, acute kidney injury, myocardial stress, ophthalmologic emergencies. These are considered less likely due to history of present illness and physical exam findings.   This is most consistent with an acute life/limb threatening illness complicated by underlying chronic conditions. Will evaluate for hypertensive emergency as below.  Initial Plan:  Screening labs including CBC and Metabolic panel to evaluate for infectious or metabolic etiology of  disease.  Urinalysis with reflex culture ordered to evaluate for UTI or relevant urologic/nephrologic pathology.  CXR to evaluate for structural/infectious intrathoracic pathology.  Troponin/EKG to evaluate for cardiac pathology. Objective evaluation as below reviewed. Considered further administration of antihypertensives in ED, per consensus guidelines for [facility] of emergency physicians, acute treatment of hypertensive urgency alone in the emergency department is not recommended.  If patient has evidence of hypertensive emergency on objective laboratory evaluation, will reevaluate.  Will monitor blood pressure while patient awaiting above laboratory studies.  Initial Study Results:   Laboratory  All laboratory results reviewed without evidence of clinically relevant pathology.    EKG EKG was reviewed independently. Rate, rhythm, axis, intervals all examined and without medically relevant abnormality. ST segments without concerns for elevations.    Radiology:  All images reviewed independently. Agree with radiology report at this time.   DG Chest Portable 1 View Result Date: 02/03/2023 CLINICAL DATA:  Chest pain EXAM: PORTABLE CHEST 1 VIEW COMPARISON:  Chest x-ray 03/20/2022 FINDINGS: Loop recorder device overlies the left chest. The heart size and mediastinal contours are within normal limits. Both lungs are clear. The visualized skeletal structures are unremarkable. IMPRESSION: No active disease. Electronically Signed   By: Darliss Cheney M.D.   On: 02/03/2023 20:35   CUP PACEART REMOTE DEVICE CHECK Result Date: 02/03/2023 [name] summary report received. Battery status OK. Normal device function. 3 new symptom and 3 pause episodes. 8 new AF episodes. No tachy or brady episodes. All episodes previously reviewed and routed to clinic on 01/16/23 and 01/30/23.  Monthly summary reports and ROV/PRN. [facility], CV[facility]  MM 3D SCREENING MAMMOGRAM BILATERAL BREAST Result Date: 01/28/2023 CLINICAL DATA:   Screening. EXAM: DIGITAL SCREENING BILATERAL MAMMOGRAM WITH TOMOSYNTHESIS AND CAD TECHNIQUE: Bilateral screening digital craniocaudal and mediolateral oblique mammograms were obtained. Bilateral screening digital breast tomosynthesis was performed. The images were evaluated with computer-aided detection. COMPARISON:  Previous exam(s). ACR Breast Density Category b: There are scattered areas of fibroglandular density. FINDINGS: There are no findings suspicious for malignancy. IMPRESSION: No mammographic evidence of malignancy. A result letter of this screening mammogram will be mailed directly to the patient. RECOMMENDATION: Screening mammogram in one year. (Code:SM-B-01Y) BI-RADS CATEGORY  1: Negative. Electronically Signed   By: Amie Portland M.D.   On: 01/28/2023 14:54   Reassessment and Plan:   HPI and PE with no acute pathology. Likely her 2/2 her medication changes. No evidence of HTN emergency.  Delta troponin remained at patient's baseline of 30s to 40s Patient is in no acute distress at this time and feels comfortable with outpatient care and management.  She actually has arranged follow-up appointment tomorrow morning at 930 less than 12 hours from now.  Blood pressure nadir in the low 170s which is a spontaneous reduction of approximately 15% from her peak earlier in the day. Given asymptomatic nature overall well appearance and strong follow-up in the a.m., patient stable for discharge at this time with PCP  visit in the morning.  Disposition:  I have considered need for hospitalization, however, considering all of the above, I believe this patient is stable for discharge at this time.  Patient/family educated about specific return precautions for given chief complaint and symptoms.  Patient/family educated about follow-up with PCP.     Patient/family expressed understanding of return precautions and need for follow-up. Patient spoken to regarding all imaging and laboratory results and  appropriate follow up for these results. All education provided in verbal form with additional information in written form. Time was allowed for answering of patient questions. Patient discharged.    Emergency Department Medication Summary:   Medications - No data to display      Clinical Impression:  1. Secondary hypertension      Discharge   Final Clinical Impression(s) / ED Diagnoses Final diagnoses:  Secondary hypertension    Rx / DC Orders ED Discharge Orders     None         Glyn Ade, MD 02/03/23 2209

## 2023-02-04 ENCOUNTER — Ambulatory Visit: Payer: Medicare Other | Attending: Cardiology | Admitting: Cardiology

## 2023-02-04 ENCOUNTER — Encounter: Payer: Self-pay | Admitting: Cardiology

## 2023-02-04 ENCOUNTER — Encounter: Payer: Self-pay | Admitting: Nurse Practitioner

## 2023-02-04 VITALS — BP 128/78 | HR 72 | Ht 67.0 in | Wt 197.0 lb

## 2023-02-04 DIAGNOSIS — Z95818 Presence of other cardiac implants and grafts: Secondary | ICD-10-CM | POA: Diagnosis not present

## 2023-02-04 DIAGNOSIS — I495 Sick sinus syndrome: Secondary | ICD-10-CM

## 2023-02-04 DIAGNOSIS — I10 Essential (primary) hypertension: Secondary | ICD-10-CM | POA: Diagnosis not present

## 2023-02-04 DIAGNOSIS — I480 Paroxysmal atrial fibrillation: Secondary | ICD-10-CM

## 2023-02-04 DIAGNOSIS — D6869 Other thrombophilia: Secondary | ICD-10-CM | POA: Diagnosis not present

## 2023-02-04 DIAGNOSIS — G4733 Obstructive sleep apnea (adult) (pediatric): Secondary | ICD-10-CM | POA: Diagnosis not present

## 2023-02-04 MED ORDER — POTASSIUM CHLORIDE ER 20 MEQ PO TBCR: 20.0000 meq | EXTENDED_RELEASE_TABLET | Freq: Every day | ORAL | 3 refills | Status: DC

## 2023-02-04 NOTE — Patient Instructions (Signed)
Medication Instructions:  Increase Potassium to 20 MEQ daily   *If you need a refill on your cardiac medications before your next appointment, please call your pharmacy*   Follow-Up: At [facility], you and your health needs are our priority.  As part of our continuing mission to provide you with exceptional heart care, we have created designated Provider Care Teams.  These Care Teams include your primary Cardiologist (physician) and Advanced Practice Providers (APPs -  Physician Assistants and Nurse Practitioners) who all work together to provide you with the care you need, when you need it.  We recommend signing up for the patient portal called "MyChart".  Sign up information is provided on this After Visit Summary.  MyChart is used to connect with patients for Virtual Visits (Telemedicine).  Patients are able to view lab/test results, encounter notes, upcoming appointments, etc.  Non-urgent messages can be sent to your provider as well.   To learn more about what you can do with MyChart, go to [email or web address].    Your next appointment:   6 month(s)  Provider:   Sherie Don, NP

## 2023-02-06 ENCOUNTER — Ambulatory Visit (HOSPITAL_COMMUNITY): Payer: Medicare Other | Admitting: Internal Medicine

## 2023-02-06 ENCOUNTER — Encounter (HOSPITAL_COMMUNITY): Payer: Self-pay

## 2023-02-06 ENCOUNTER — Ambulatory Visit (INDEPENDENT_AMBULATORY_CARE_PROVIDER_SITE_OTHER): Payer: Medicare Other | Admitting: Behavioral Health

## 2023-02-06 ENCOUNTER — Encounter: Payer: Self-pay | Admitting: Behavioral Health

## 2023-02-06 DIAGNOSIS — F4323 Adjustment disorder with mixed anxiety and depressed mood: Secondary | ICD-10-CM

## 2023-02-06 DIAGNOSIS — F411 Generalized anxiety disorder: Secondary | ICD-10-CM
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
## 2023-02-06 NOTE — Progress Notes (Signed)
 [facility] Summary Report / Loop Recorder

## 2023-02-13 ENCOUNTER — Ambulatory Visit (INDEPENDENT_AMBULATORY_CARE_PROVIDER_SITE_OTHER): Payer: Medicare Other | Admitting: Behavioral Health

## 2023-02-13 ENCOUNTER — Encounter: Payer: Self-pay | Admitting: Behavioral Health

## 2023-02-13 DIAGNOSIS — F4323 Adjustment disorder with mixed anxiety and depressed mood: Secondary | ICD-10-CM | POA: Diagnosis not present

## 2023-02-13 NOTE — Progress Notes (Signed)
 [facility] Counselor/Therapist Progress Note  Patient ID: Shannon [name], MRN: [id number],    Date: February 13, 2023  Time Spent: 58 minutes, 3 PM until 3:58 PM this session was held via video teletherapy. The patient consented to the video teletherapy and was located in her home during this session. She is aware it is the responsibility of the patient to secure confidentiality on her end of the session. The provider was in a private home office for the duration of this session.     Reported Symptoms: Anxiety, depression  Mental Status Exam: Appearance:  Well Groomed     Behavior: Appropriate  Motor: Normal  Speech/Language:  Clear and Coherent  Affect: Appropriate for  Mood: normal  Thought process: normal  Thought content:   WNL  Sensory/Perceptual disturbances:   WNL  Orientation: oriented to person, place, time/date, situation, day of week, month of year, and year  Attention: Good  Concentration: Good  Memory: WNL  Fund of knowledge:  Good  Insight:   Good  Judgment:  Good  Impulse Control: Good   Risk Assessment: Danger to Self:  No Self-injurious Behavior: No Danger to Others: No Duty to Warn:no Physical Aggression / Violence:No  Access to Firearms a concern: No  Gang Involvement:No   Subjective: This has been a better week for the patient.  She has been tapering off the Effexor  and for the most part that has gone well.  She can tell the days that she did not take it her mood was slightly depressed but that has been better.  Her mood overall has been brighter.  She has had more energy.  She is sleeping all night which she has not done for a lengthy amount of time.  She has been able to get out and go some and spent this morning running errands when normally she would not have had the energy to do so.  She has not had any significant changes in blood pressure or any heart palpitations.  She continues to set healthy boundaries in relationships.  She  says that she is wrestling with the head versus hearts boundary with one relationship but she knows that she is doing the right thing process monitoring that boundary so that she can stay healthy mentally emotionally physically and spiritually.  She reports that she feels she is managing her anxiety well through time with family.  She was doing things that she has not been able to do for many years and is starting to enjoy having some time for herself. She does contract for safety having no thoughts of hurting herself or anyone else. Interventions: Cognitive Behavioral Therapy  Diagnosis: Adjustment disorder with mixed anxiety and depressed mood.  Plan: I will meet with the patient every 2 weeks via video session TX. Plan:To use cognitive behavioral therapy principles as well as elements of dialectical behavior therapy.  Goals are to reduce anxiety and depression by at least 50% with a target date of November 06, 2022.  Goals are to have less sadness as indicated by PHQ-9 scores as well as patient report.  We also work on improving mood and return to a healthier level of functioning as defined by her goals for being happy, identify causes and process triggers for depressed mood.  We will use cognitive behavioral therapy to explore and replace unhealthy thoughts and behavior patterns contributing to depression.  I will continue to encourage shearing of feelings related to the causes and symptoms of depression as well as  teach and encouraged use of coping skills for management of depressive symptoms.  We also will work to improve the patient's ability to manage anxiety symptoms and better handle stress, identify causes for anxiety and explore ways for reduction of anxiety in addition to resolving conflicts contributing to anxiety and managing thoughts and worrisome thinking is contributing to anxiety.  Interventions will include providing education about anxiety, facilitate problem solution skills, teaching  coping skills for managing anxiety such as grounding exercises, progressive muscle relaxation and cognitive re framing etc.  We will also use cognitive behavioral therapy to identify and change anxiety provoking thoughts and behavior patterns as well as using DBT distress tolerance and mindfulness skills. I reviewed the treatment plan goals with the patient who agreed to continue with goals as stated above. Progress: 35%New target date will be May 06, 2023. Shannon [name], [facility]                                    Shannon [name], [facility]               Shannon [name], [facility]

## 2023-02-16 NOTE — Telephone Encounter (Signed)
 Tammy,  I printed out a DL for you to review regarding this message.  Thank you.

## 2023-02-20 ENCOUNTER — Ambulatory Visit: Payer: Medicare Other | Admitting: Behavioral Health

## 2023-02-20 ENCOUNTER — Encounter: Payer: Self-pay | Admitting: Behavioral Health

## 2023-02-20 DIAGNOSIS — F4323 Adjustment disorder with mixed anxiety and depressed mood: Secondary | ICD-10-CM

## 2023-02-20 NOTE — Progress Notes (Signed)
 [facility] Counselor/Therapist Progress Note  Patient ID: Shannon [name], MRN: [id number],    Date: February 20, 2023  Time Spent: 58 minutes, 3 PM until 3:58 PM this session was held via video teletherapy. The patient consented to the video teletherapy and was located in her home during this session. She is aware it is the responsibility of the patient to secure confidentiality on her end of the session. The provider was in a private home office for the duration of this session.     Reported Symptoms: Anxiety, depression  Mental Status Exam: Appearance:  Well Groomed     Behavior: Appropriate  Motor: Normal  Speech/Language:  Clear and Coherent  Affect: Appropriate for  Mood: normal  Thought process: normal  Thought content:   WNL  Sensory/Perceptual disturbances:   WNL  Orientation: oriented to person, place, time/date, situation, day of week, month of year, and year  Attention: Good  Concentration: Good  Memory: WNL  Fund of knowledge:  Good  Insight:   Good  Judgment:  Good  Impulse Control: Good   Risk Assessment: Danger to Self:  No Self-injurious Behavior: No Danger to Others: No Duty to Warn:no Physical Aggression / Violence:No  Access to Firearms a concern: No  Gang Involvement:No   Subjective: The patient is taking care of some things that she needs to take care of in banking and other things that in the past have been handled by her husband.  She was able to get out this morning and do some fun things for herself and has more energy to do that.  She did have some mood instability and is in week 3 of coming off of the Effexor.  It was an overwhelming but last week was good and so she was a little surprised by the dip in mood.  She has set some boundaries at home.  She loves to help her daughter and her grandson but told her daughter that there were certain things that she just could not do anymore daughter was in complete agreement.  I applauded  her for setting healthy boundaries.  They also switched with the patient moving upstairs so she has more space and if a friend or brother come to visit there is a bedroom upstairs also.  I encouraged her to set up those type of things to do.  She has a list of things that she wanted to do in retirement and although her situation is changed she said there are still things that she would like to do and I encouraged her to start looking at what those things are and can she do them.  Some are small such as reading books think she can do at home and others are weekend trips that she could take.  She does contract for safety having no thoughts of hurting herself or anyone else. Interventions: Cognitive Behavioral Therapy  Diagnosis: Adjustment disorder with mixed anxiety and depressed mood.  Plan: I will meet with the patient every 2 weeks via video session TX. Plan:To use cognitive behavioral therapy principles as well as elements of dialectical behavior therapy.  Goals are to reduce anxiety and depression by at least 50% with a target date of November 06, 2022.  Goals are to have less sadness as indicated by PHQ-9 scores as well as patient report.  We also work on improving mood and return to a healthier level of functioning as defined by her goals for being happy, identify causes and process triggers for  depressed mood.  We will use cognitive behavioral therapy to explore and replace unhealthy thoughts and behavior patterns contributing to depression.  I will continue to encourage shearing of feelings related to the causes and symptoms of depression as well as teach and encouraged use of coping skills for management of depressive symptoms.  We also will work to improve the patient's ability to manage anxiety symptoms and better handle stress, identify causes for anxiety and explore ways for reduction of anxiety in addition to resolving conflicts contributing to anxiety and managing thoughts and worrisome  thinking is contributing to anxiety.  Interventions will include providing education about anxiety, facilitate problem solution skills, teaching coping skills for managing anxiety such as grounding exercises, progressive muscle relaxation and cognitive re framing etc.  We will also use cognitive behavioral therapy to identify and change anxiety provoking thoughts and behavior patterns as well as using DBT distress tolerance and mindfulness skills. I reviewed the treatment plan goals with the patient who agreed to continue with goals as stated above. Progress: 35%New target date will be May 06, 2023. [name], [facility]                                    [name], [facility]               [name], [facility]               [name], [facility]

## 2023-02-23 ENCOUNTER — Telehealth (HOSPITAL_COMMUNITY): Payer: Self-pay | Admitting: *Deleted

## 2023-02-23 NOTE — Telephone Encounter (Addendum)
 Forwarding to Dr. Lalla Brothers for review:   [name]: AF Management  5 sec post-conversion pause on [name]. Sx's:  Lightheaded, dizzy, energy depleted, "felt like she was dying, like heart stopped twice". Diaphoretic.  Forwarded from af clinic for our review.

## 2023-02-23 NOTE — Telephone Encounter (Signed)
 Discussed with Dr. Lalla Brothers:   Stop Carvedilol Add on to 02/25/23 schedule to see Dr. Lalla Brothers in clinic Give ER precautions.  Patient notified and she will follow above recommendations.  Knows that if sx's re-occur before Wednesday, or gets worse - she will go to ER.

## 2023-02-23 NOTE — Telephone Encounter (Signed)
 Patient called to afib clinic stating she was in afib this morning and her "heart stopped twice" the first time she did not convert to sinus but the second time it stopped she converted to normal rhythm.  Pt states she felt lightheaded, dizzy and like all her energy was depleted "and she was dying" then it stopped. The second episode was more pronounced than the first. The pt states she was under a tremendous amount stress this morning which she feels triggered the afib episode. She was diaphoretic when she was in afib.   Ricky Fenton PA did view [facility] report confirming post termination pause of 5 seconds. Will forward to Device clinic/Dr Lalla Brothers for further assessment/management. Pt will await call from their office. ER precautions were reviewed with the patient.

## 2023-02-25 ENCOUNTER — Ambulatory Visit: Payer: Medicare Other | Admitting: Cardiology

## 2023-02-26 NOTE — Progress Notes (Unsigned)
 Electrophysiology Office Follow up Visit Note:    Date:  02/26/2023   ID:  Shannon [name], DOB Jan 08, 1950, MRN [id number]  PCP:  Eden Emms, NP  [facility] Cardiologist:  None  [facility] Electrophysiologist:  Lanier Prude, MD    Interval History:     Shannon [name] is a 73 y.o. female who presents for a follow up visit.   The patient was last seen by [facility] February 04, 2023.  She has a history of atrial fibrillation, hypertension, orthostatic hypotension, sleep apnea on CPAP, stroke, anxiety.  She has had multiple prior catheter ablations and has previously failed Tikosyn and amiodarone.  She takes Eliquis for stroke prophylaxis.  At the appointment with Rosalita Chessman, her loop recorder tracings were reviewed.  She had brief postconversion pauses.  No syncope was associated with these episodes.  On February 23, 2023 the patient called the office reporting episodes of lightheadedness and dizziness.  She felt like she may be "dying".  These were more severe than prior episodes of lightheadedness.  Interrogation of her loop recorder demonstrated atrial fibrillation stopping with a 5-second postconversion pause.  After the pause the patient was bradycardic before slowly trending up to a more normal heart rate.  She is with her family today in clinic.  She is doing well.  She reports that she was highly symptomatic during the pause episodes.  She felt like she was going to die.  She felt like the life was draining out of her.  She did not lose consciousness.  She has been fearful of driving.     Past medical, surgical, social and family history were reviewed.  ROS:   Please see the history of present illness.    All other systems reviewed and are negative.  EKGs/Labs/Other Studies Reviewed:    The following studies were reviewed today:  Loop recorder interrogations reviewed        Physical Exam:    VS:  There were no vitals taken for this visit.     Wt Readings from Last 3 Encounters:  02/04/23 197 lb (89.4 kg)  02/03/23 203 lb (92.1 kg)  12/18/22 203 lb 3.2 oz (92.2 kg)     GEN: no distress CARD: RRR, No MRG RESP: No IWOB. CTAB.      ASSESSMENT:    1. Paroxysmal atrial fibrillation (HCC)   2. Tachycardia-bradycardia (HCC)   3. 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  She has had pauses that seem to be increasing in length and with the severity of associated symptoms.  This is limiting our ability to manage her atrial arrhythmias.  I have recommended a permanent pacemaker.  I discussed the pacemaker implant procedure in detail with the patient including the risks and she wishes to proceed.  Plan for a dual-chamber permanent pacemaker with [employer].  She will need to hold her Eliquis for 48 hours prior to the implant to minimize risk of bleeding.  The pros and cons of holding anticoagulation with her history of stroke were discussed during today's clinic appointment and she wishes to proceed with this plan.  Risks, benefits, alternatives to PPM implantation were discussed in detail with the patient today. The patient understands that the risks include but are not limited to bleeding, infection, pneumothorax, perforation, tamponade, vascular damage, renal failure, MI, stroke, death, and lead dislodgement and  wishes to proceed.  We will therefore schedule device implantation at the next available time.  She should avoid driving until after the pacemaker is implanted.   #Hypertension Slightly above goal today.  Recommend checking blood pressures 1-2 times per week at home and recording the values.  Recommend bringing these recordings to the primary care physician. Will reassess BP control once PPM implanted to assess whether restarting Coreg is  necessary.  Signed, Steffanie Dunn, MD, [facility], [facility] 02/26/2023 10:45 AM    Electrophysiology [facility]

## 2023-02-27 ENCOUNTER — Encounter: Payer: Self-pay | Admitting: Behavioral Health

## 2023-02-27 ENCOUNTER — Encounter: Payer: Self-pay | Admitting: Cardiology

## 2023-02-27 ENCOUNTER — Ambulatory Visit: Payer: Medicare Other | Admitting: Behavioral Health

## 2023-02-27 ENCOUNTER — Ambulatory Visit: Payer: Medicare Other | Attending: Cardiology | Admitting: Cardiology

## 2023-02-27 VITALS — BP 146/84 | HR 77 | Ht 66.0 in | Wt 186.2 lb

## 2023-02-27 DIAGNOSIS — F4323 Adjustment disorder with mixed anxiety and depressed mood: Secondary | ICD-10-CM | POA: Diagnosis not present

## 2023-02-27 DIAGNOSIS — I495 Sick sinus syndrome: Secondary | ICD-10-CM

## 2023-02-27 DIAGNOSIS — I10 Essential (primary) hypertension: Secondary | ICD-10-CM

## 2023-02-27 DIAGNOSIS — I480 Paroxysmal atrial fibrillation: Secondary | ICD-10-CM

## 2023-02-27 LAB — CBC WITH DIFFERENTIAL/PLATELET

## 2023-02-27 NOTE — Patient Instructions (Signed)
 Medication Instructions:  Your physician recommends that you continue on your current medications as directed. Please refer to the Current Medication list given to you today.  *If you need a refill on your cardiac medications before your next appointment, please call your pharmacy*  Lab Work: BMET and CBC    Testing/Procedures: Pacemaker  Your physician has recommended that you have a pacemaker inserted. A pacemaker is a small device that is placed under the skin of your chest or abdomen to help control abnormal heart rhythms. This device uses electrical pulses to prompt the heart to beat at a normal rate. Pacemakers are used to treat heart rhythms that are too slow. Wire (leads) are attached to the pacemaker that goes into the chambers of you heart. This is done in the hospital and usually requires and overnight stay. Please see the instruction sheet given to you today for more information.   Follow-Up: At [facility], you and your health needs are our priority.  As part of our continuing mission to provide you with exceptional heart care, we have created designated Provider Care Teams.  These Care Teams include your primary Cardiologist (physician) and Advanced Practice Providers (APPs -  Physician Assistants and Nurse Practitioners) who all work together to provide you with the care you need, when you need it.   Your next appointment:   We will call you to schedule your post-procedure appointments.

## 2023-02-27 NOTE — Progress Notes (Signed)
  Behavioral Health Counselor/Therapist Progress Note  Patient ID: Gavyn Zoss, MRN: [id number],    Date: February 27, 2023  Time Spent: 58 minutes, 3 PM until 3:58 PM this session was held via video teletherapy. The patient consented to the video teletherapy and was located in her home during this session. She is aware it is the responsibility of the patient to secure confidentiality on her end of the session. The provider was in a private home office for the duration of this session.     Reported Symptoms: Anxiety, depression  Mental Status Exam: Appearance:  Well Groomed     Behavior: Appropriate  Motor: Normal  Speech/Language:  Clear and Coherent  Affect: Appropriate for  Mood: normal  Thought process: normal  Thought content:   WNL  Sensory/Perceptual disturbances:   WNL  Orientation: oriented to person, place, time/date, situation, day of week, month of year, and year  Attention: Good  Concentration: Good  Memory: WNL  Fund of knowledge:  Good  Insight:   Good  Judgment:  Good  Impulse Control: Good   Risk Assessment: Danger to Self:  No Self-injurious Behavior: No Danger to Others: No Duty to Warn:no Physical Aggression / Violence:No  Access to Firearms a concern: No  Gang Involvement:No   Subjective: The patient had a very difficult week.  There were some situations which she knows elevate her heart rate causing her to go into A-fib.  She said that the same day her heart stopped twice, the second time for the longest that had ever stopped.  She did get an emergency appointment today with a cardiologist who says it is time for a pacemaker.  She is prepared for that and is scheduled for a few weeks from now.  He took her off of 1 medication which she says she is breathing better.  She had already felt a little better coming off of Effexor but says she has little more energy now and questions if she might have been on too much medication in general.  I  encouraged her to have that conversation with her cardiologist when she has a full appointment and also with her primary care physician.  I encouraged her to set very clear and firm boundaries in regard to the situation and the person that caused the stress of this week.  We also talked about intentionally using coping skills on a regular and daily basis.  She knows that if she has any more issues she will reach back out to her doctor.   She does contract for safety having no thoughts of hurting herself or anyone else. Interventions: Cognitive Behavioral Therapy  Diagnosis: Adjustment disorder with mixed anxiety and depressed mood.  Plan: I will meet with the patient every 2 weeks via video session TX. Plan:To use cognitive behavioral therapy principles as well as elements of dialectical behavior therapy.  Goals are to reduce anxiety and depression by at least 50% with a target date of November 06, 2022.  Goals are to have less sadness as indicated by PHQ-9 scores as well as patient report.  We also work on improving mood and return to a healthier level of functioning as defined by her goals for being happy, identify causes and process triggers for depressed mood.  We will use cognitive behavioral therapy to explore and replace unhealthy thoughts and behavior patterns contributing to depression.  I will continue to encourage shearing of feelings related to the causes and symptoms of depression as well as teach  and encouraged use of coping skills for management of depressive symptoms.  We also will work to improve the patient's ability to manage anxiety symptoms and better handle stress, identify causes for anxiety and explore ways for reduction of anxiety in addition to resolving conflicts contributing to anxiety and managing thoughts and worrisome thinking is contributing to anxiety.  Interventions will include providing education about anxiety, facilitate problem solution skills, teaching coping skills for  managing anxiety such as grounding exercises, progressive muscle relaxation and cognitive re framing etc.  We will also use cognitive behavioral therapy to identify and change anxiety provoking thoughts and behavior patterns as well as using DBT distress tolerance and mindfulness skills. I reviewed the treatment plan goals with the patient who agreed to continue with goals as stated above. Progress: 35%New target date will be May 06, 2023. [name], [facility]                                    [name], [facility]               [name], [facility]               [name], [facility]               [name], [facility]

## 2023-02-28 LAB — CBC WITH DIFFERENTIAL/PLATELET
Basophils Absolute: 0.1 10*3/uL (ref 0.0–0.2)
Basos: 1 %
EOS (ABSOLUTE): 0.3 10*3/uL (ref 0.0–0.4)
Eos: 4 %
Hematocrit: 39.1 % (ref 34.0–46.6)
Hemoglobin: 12.5 g/dL (ref 11.1–15.9)
Immature Grans (Abs): 0 10*3/uL (ref 0.0–0.1)
Immature Granulocytes: 0 %
Lymphocytes Absolute: 1.5 10*3/uL (ref 0.7–3.1)
Lymphs: 22 %
MCH: 28.9 pg (ref 26.6–33.0)
MCHC: 32 g/dL (ref 31.5–35.7)
MCV: 90 fL (ref 79–97)
Monocytes Absolute: 0.7 10*3/uL (ref 0.1–0.9)
Monocytes: 10 %
Neutrophils Absolute: 4.2 10*3/uL (ref 1.4–7.0)
Neutrophils: 63 %
Platelets: 317 10*3/uL (ref 150–450)
RBC: 4.33 x10E6/uL (ref 3.77–5.28)
RDW: 12.8 % (ref 11.7–15.4)
WBC: 6.7 10*3/uL (ref 3.4–10.8)

## 2023-02-28 LAB — BASIC METABOLIC PANEL
BUN/Creatinine Ratio: 15 (ref 12–28)
BUN: 12 mg/dL (ref 8–27)
CO2: 28 mmol/L (ref 20–29)
Calcium: 9.7 mg/dL (ref 8.7–10.3)
Chloride: 101 mmol/L (ref 96–106)
Creatinine, Ser: 0.8 mg/dL (ref 0.57–1.00)
Glucose: 86 mg/dL (ref 70–99)
Potassium: 4.3 mmol/L (ref 3.5–5.2)
Sodium: 141 mmol/L (ref 134–144)
eGFR: 79 mL/min/{1.73_m2} (ref >59)

## 2023-03-04 ENCOUNTER — Telehealth (HOSPITAL_COMMUNITY): Payer: Self-pay | Admitting: *Deleted

## 2023-03-04 ENCOUNTER — Other Ambulatory Visit (HOSPITAL_COMMUNITY): Payer: Self-pay | Admitting: Cardiology

## 2023-03-04 NOTE — Telephone Encounter (Signed)
 Patient left message on afib voicemail regarding concern of her blood pressure elevation since stopping coreg.  BP has been running systolic in the 180s. Will forward to Dr Lovena Neighbours office with recent medication changes/pending pacemaker procedure.

## 2023-03-04 NOTE — Telephone Encounter (Signed)
 Patient reported that she woke up this morning and her blood pressure was 161/83. Later this morning around 10am she wasn't feeling good and was stressed out so she rechecked her blood pressure and it was 182/94, HR 89. She states that eventually it came down to 156/81, HR 76. She currently is feeling fine. She takes her amlodipine in the evenings and has not yet taken it today. She states that she has been really anxious lately about going back into AFIB and her heart pausing again. Aware of ER precautions for future episodes. She is having a PPM placed on 3/18. She also reports anxiety with things at home. She does have an expired prescription for lorazepam to take as needed. She is going to reach out to her primary care provider about this.  She has been off of her Coreg which she reports feeling better off of. When she saw Dr. Lalla Brothers he recommended remaining of it and monitoring her BP at home. BP control was to be reassessed after PPM implant. BP's have been ranging from 130-160/70-80. Advised to continue to monitor and I will make Dr. Lalla Brothers aware.

## 2023-03-05 ENCOUNTER — Ambulatory Visit (INDEPENDENT_AMBULATORY_CARE_PROVIDER_SITE_OTHER): Payer: Medicare Other

## 2023-03-05 DIAGNOSIS — I495 Sick sinus syndrome: Secondary | ICD-10-CM

## 2023-03-06 ENCOUNTER — Ambulatory Visit: Payer: Medicare Other | Admitting: Behavioral Health

## 2023-03-06 ENCOUNTER — Other Ambulatory Visit: Payer: Self-pay

## 2023-03-06 ENCOUNTER — Encounter (HOSPITAL_COMMUNITY): Payer: Self-pay | Admitting: *Deleted

## 2023-03-06 ENCOUNTER — Inpatient Hospital Stay (HOSPITAL_COMMUNITY)
Admission: EM | Admit: 2023-03-06 | Discharge: 2023-03-10 | DRG: 243 | Disposition: A | Discharge status: Home / Self Care | Payer: Medicare Other | Attending: Internal Medicine | Admitting: Internal Medicine

## 2023-03-06 DIAGNOSIS — Z8673 Personal history of transient ischemic attack (TIA), and cerebral infarction without residual deficits: Secondary | ICD-10-CM | POA: Diagnosis not present

## 2023-03-06 DIAGNOSIS — Z888 Allergy status to other drugs, medicaments and biological substances status: Secondary | ICD-10-CM

## 2023-03-06 DIAGNOSIS — Z801 Family history of malignant neoplasm of trachea, bronchus and lung: Secondary | ICD-10-CM | POA: Diagnosis not present

## 2023-03-06 DIAGNOSIS — I480 Paroxysmal atrial fibrillation: Principal | ICD-10-CM | POA: Diagnosis present

## 2023-03-06 DIAGNOSIS — Z885 Allergy status to narcotic agent status: Secondary | ICD-10-CM | POA: Diagnosis not present

## 2023-03-06 DIAGNOSIS — I5032 Chronic diastolic (congestive) heart failure: Secondary | ICD-10-CM | POA: Diagnosis present

## 2023-03-06 DIAGNOSIS — Z87891 Personal history of nicotine dependence: Secondary | ICD-10-CM | POA: Diagnosis not present

## 2023-03-06 DIAGNOSIS — F411 Generalized anxiety disorder: Secondary | ICD-10-CM | POA: Diagnosis not present

## 2023-03-06 DIAGNOSIS — I2510 Atherosclerotic heart disease of native coronary artery without angina pectoris: Secondary | ICD-10-CM | POA: Diagnosis present

## 2023-03-06 DIAGNOSIS — I4891 Unspecified atrial fibrillation: Principal | ICD-10-CM

## 2023-03-06 DIAGNOSIS — Z7901 Long term (current) use of anticoagulants: Secondary | ICD-10-CM | POA: Diagnosis not present

## 2023-03-06 DIAGNOSIS — J45909 Unspecified asthma, uncomplicated: Secondary | ICD-10-CM | POA: Diagnosis not present

## 2023-03-06 DIAGNOSIS — I10 Essential (primary) hypertension: Secondary | ICD-10-CM | POA: Diagnosis present

## 2023-03-06 DIAGNOSIS — Z882 Allergy status to sulfonamides status: Secondary | ICD-10-CM

## 2023-03-06 DIAGNOSIS — Z833 Family history of diabetes mellitus: Secondary | ICD-10-CM

## 2023-03-06 DIAGNOSIS — I639 Cerebral infarction, unspecified: Secondary | ICD-10-CM

## 2023-03-06 DIAGNOSIS — I110 Hypertensive heart disease with heart failure: Secondary | ICD-10-CM | POA: Diagnosis not present

## 2023-03-06 DIAGNOSIS — D6869 Other thrombophilia: Secondary | ICD-10-CM | POA: Diagnosis present

## 2023-03-06 DIAGNOSIS — I495 Sick sinus syndrome: Secondary | ICD-10-CM | POA: Diagnosis not present

## 2023-03-06 DIAGNOSIS — G4733 Obstructive sleep apnea (adult) (pediatric): Secondary | ICD-10-CM | POA: Diagnosis present

## 2023-03-06 DIAGNOSIS — Z79899 Other long term (current) drug therapy: Secondary | ICD-10-CM | POA: Diagnosis not present

## 2023-03-06 DIAGNOSIS — I2489 Other forms of acute ischemic heart disease: Secondary | ICD-10-CM | POA: Diagnosis not present

## 2023-03-06 DIAGNOSIS — E66812 Obesity, class 2: Secondary | ICD-10-CM | POA: Diagnosis present

## 2023-03-06 DIAGNOSIS — Z823 Family history of stroke: Secondary | ICD-10-CM | POA: Diagnosis not present

## 2023-03-06 DIAGNOSIS — Z8249 Family history of ischemic heart disease and other diseases of the circulatory system: Secondary | ICD-10-CM | POA: Diagnosis not present

## 2023-03-06 DIAGNOSIS — R079 Chest pain, unspecified: Secondary | ICD-10-CM | POA: Diagnosis not present

## 2023-03-06 DIAGNOSIS — G8929 Other chronic pain: Secondary | ICD-10-CM | POA: Diagnosis present

## 2023-03-06 DIAGNOSIS — Z91199 Patient's noncompliance with other medical treatment and regimen due to unspecified reason: Secondary | ICD-10-CM

## 2023-03-06 DIAGNOSIS — E785 Hyperlipidemia, unspecified: Secondary | ICD-10-CM | POA: Diagnosis not present

## 2023-03-06 DIAGNOSIS — I700 Atherosclerosis of aorta: Secondary | ICD-10-CM | POA: Diagnosis not present

## 2023-03-06 DIAGNOSIS — Z6831 Body mass index (BMI) 31.0-31.9, adult: Secondary | ICD-10-CM

## 2023-03-06 DIAGNOSIS — Z5986 Financial insecurity: Secondary | ICD-10-CM

## 2023-03-06 NOTE — ED Triage Notes (Signed)
 The pt has af and she has been in af since last week  she reports that she is stressed  some chest pain

## 2023-03-07 ENCOUNTER — Encounter (HOSPITAL_COMMUNITY): Payer: Self-pay | Admitting: Internal Medicine

## 2023-03-07 ENCOUNTER — Emergency Department (HOSPITAL_COMMUNITY)

## 2023-03-07 DIAGNOSIS — Z7901 Long term (current) use of anticoagulants: Secondary | ICD-10-CM | POA: Diagnosis not present

## 2023-03-07 DIAGNOSIS — Z8673 Personal history of transient ischemic attack (TIA), and cerebral infarction without residual deficits: Secondary | ICD-10-CM

## 2023-03-07 DIAGNOSIS — F411 Generalized anxiety disorder: Secondary | ICD-10-CM | POA: Diagnosis present

## 2023-03-07 DIAGNOSIS — G4733 Obstructive sleep apnea (adult) (pediatric): Secondary | ICD-10-CM | POA: Diagnosis not present

## 2023-03-07 DIAGNOSIS — I700 Atherosclerosis of aorta: Secondary | ICD-10-CM | POA: Diagnosis not present

## 2023-03-07 DIAGNOSIS — Z79899 Other long term (current) drug therapy: Secondary | ICD-10-CM | POA: Diagnosis not present

## 2023-03-07 DIAGNOSIS — I771 Stricture of artery: Secondary | ICD-10-CM | POA: Diagnosis not present

## 2023-03-07 DIAGNOSIS — R079 Chest pain, unspecified: Secondary | ICD-10-CM | POA: Diagnosis not present

## 2023-03-07 DIAGNOSIS — I10 Essential (primary) hypertension: Secondary | ICD-10-CM | POA: Diagnosis not present

## 2023-03-07 DIAGNOSIS — Z882 Allergy status to sulfonamides status: Secondary | ICD-10-CM | POA: Diagnosis not present

## 2023-03-07 DIAGNOSIS — I495 Sick sinus syndrome: Secondary | ICD-10-CM

## 2023-03-07 DIAGNOSIS — Z801 Family history of malignant neoplasm of trachea, bronchus and lung: Secondary | ICD-10-CM | POA: Diagnosis not present

## 2023-03-07 DIAGNOSIS — I517 Cardiomegaly: Secondary | ICD-10-CM | POA: Diagnosis not present

## 2023-03-07 DIAGNOSIS — I5032 Chronic diastolic (congestive) heart failure: Secondary | ICD-10-CM | POA: Diagnosis not present

## 2023-03-07 DIAGNOSIS — D6869 Other thrombophilia: Secondary | ICD-10-CM | POA: Diagnosis not present

## 2023-03-07 DIAGNOSIS — Z833 Family history of diabetes mellitus: Secondary | ICD-10-CM | POA: Diagnosis not present

## 2023-03-07 DIAGNOSIS — I639 Cerebral infarction, unspecified: Secondary | ICD-10-CM | POA: Diagnosis not present

## 2023-03-07 DIAGNOSIS — Z888 Allergy status to other drugs, medicaments and biological substances status: Secondary | ICD-10-CM | POA: Diagnosis not present

## 2023-03-07 DIAGNOSIS — I2489 Other forms of acute ischemic heart disease: Secondary | ICD-10-CM | POA: Insufficient documentation

## 2023-03-07 DIAGNOSIS — I110 Hypertensive heart disease with heart failure: Secondary | ICD-10-CM | POA: Diagnosis not present

## 2023-03-07 DIAGNOSIS — I4891 Unspecified atrial fibrillation: Secondary | ICD-10-CM | POA: Diagnosis not present

## 2023-03-07 DIAGNOSIS — Z823 Family history of stroke: Secondary | ICD-10-CM | POA: Diagnosis not present

## 2023-03-07 DIAGNOSIS — I480 Paroxysmal atrial fibrillation: Secondary | ICD-10-CM | POA: Diagnosis not present

## 2023-03-07 DIAGNOSIS — Z6831 Body mass index (BMI) 31.0-31.9, adult: Secondary | ICD-10-CM | POA: Diagnosis not present

## 2023-03-07 DIAGNOSIS — Z950 Presence of cardiac pacemaker: Secondary | ICD-10-CM | POA: Diagnosis not present

## 2023-03-07 DIAGNOSIS — Z885 Allergy status to narcotic agent status: Secondary | ICD-10-CM | POA: Diagnosis not present

## 2023-03-07 DIAGNOSIS — Z87891 Personal history of nicotine dependence: Secondary | ICD-10-CM | POA: Diagnosis not present

## 2023-03-07 DIAGNOSIS — J45909 Unspecified asthma, uncomplicated: Secondary | ICD-10-CM | POA: Diagnosis not present

## 2023-03-07 DIAGNOSIS — E66812 Obesity, class 2: Secondary | ICD-10-CM | POA: Diagnosis present

## 2023-03-07 DIAGNOSIS — Z8249 Family history of ischemic heart disease and other diseases of the circulatory system: Secondary | ICD-10-CM | POA: Diagnosis not present

## 2023-03-07 DIAGNOSIS — E785 Hyperlipidemia, unspecified: Secondary | ICD-10-CM | POA: Diagnosis not present

## 2023-03-07 DIAGNOSIS — I2510 Atherosclerotic heart disease of native coronary artery without angina pectoris: Secondary | ICD-10-CM | POA: Diagnosis not present

## 2023-03-07 LAB — TSH: TSH: 1.156 u[IU]/mL (ref 0.350–4.500)

## 2023-03-07 LAB — TROPONIN I (HIGH SENSITIVITY)
Troponin I (High Sensitivity): 46 ng/L — ABNORMAL HIGH (ref <18)
Troponin I (High Sensitivity): 56 ng/L — ABNORMAL HIGH (ref <18)
Troponin I (High Sensitivity): 59 ng/L — ABNORMAL HIGH (ref <18)
Troponin I (High Sensitivity): 62 ng/L — ABNORMAL HIGH (ref <18)

## 2023-03-07 LAB — BASIC METABOLIC PANEL
Anion gap: 13 (ref 5–15)
BUN: 15 mg/dL (ref 8–23)
CO2: 24 mmol/L (ref 22–32)
Calcium: 9.7 mg/dL (ref 8.9–10.3)
Chloride: 102 mmol/L (ref 98–111)
Creatinine, Ser: 0.65 mg/dL (ref 0.44–1.00)
GFR, Estimated: >60 mL/min (ref >60)
Glucose, Bld: 137 mg/dL — ABNORMAL HIGH (ref 70–99)
Potassium: 3.6 mmol/L (ref 3.5–5.1)
Sodium: 139 mmol/L (ref 135–145)

## 2023-03-07 LAB — COMPREHENSIVE METABOLIC PANEL
ALT: 21 U/L (ref 0–44)
AST: 22 U/L (ref 15–41)
Albumin: 3.9 g/dL (ref 3.5–5.0)
Alkaline Phosphatase: 79 U/L (ref 38–126)
Anion gap: 12 (ref 5–15)
BUN: 11 mg/dL (ref 8–23)
CO2: 26 mmol/L (ref 22–32)
Calcium: 9.2 mg/dL (ref 8.9–10.3)
Chloride: 102 mmol/L (ref 98–111)
Creatinine, Ser: 0.61 mg/dL (ref 0.44–1.00)
GFR, Estimated: >60 mL/min (ref >60)
Glucose, Bld: 124 mg/dL — ABNORMAL HIGH (ref 70–99)
Potassium: 3.6 mmol/L (ref 3.5–5.1)
Sodium: 140 mmol/L (ref 135–145)
Total Bilirubin: 0.5 mg/dL (ref 0.0–1.2)
Total Protein: 7.5 g/dL (ref 6.5–8.1)

## 2023-03-07 LAB — CBC
HCT: 40.5 % (ref 36.0–46.0)
HCT: 41.3 % (ref 36.0–46.0)
Hemoglobin: 13 g/dL (ref 12.0–15.0)
Hemoglobin: 13.3 g/dL (ref 12.0–15.0)
MCH: 28.9 pg (ref 26.0–34.0)
MCH: 29.4 pg (ref 26.0–34.0)
MCHC: 32.1 g/dL (ref 30.0–36.0)
MCHC: 32.2 g/dL (ref 30.0–36.0)
MCV: 90 fL (ref 80.0–100.0)
MCV: 91.4 fL (ref 80.0–100.0)
Platelets: 299 10*3/uL (ref 150–400)
Platelets: 318 10*3/uL (ref 150–400)
RBC: 4.5 MIL/uL (ref 3.87–5.11)
RBC: 4.52 MIL/uL (ref 3.87–5.11)
RDW: 14.1 % (ref 11.5–15.5)
RDW: 14.2 % (ref 11.5–15.5)
WBC: 8.3 10*3/uL (ref 4.0–10.5)
WBC: 9.6 10*3/uL (ref 4.0–10.5)
nRBC: 0 % (ref 0.0–0.2)
nRBC: 0 % (ref 0.0–0.2)

## 2023-03-07 MED ORDER — BISACODYL 5 MG PO TBEC: 5.0000 mg | DELAYED_RELEASE_TABLET | Freq: Every day | ORAL | Status: DC | PRN

## 2023-03-07 MED ORDER — SODIUM CHLORIDE 0.9% FLUSH
3.0000 mL | INTRAVENOUS | Status: DC | PRN
  Administered 2023-03-07: 3 mL via INTRAVENOUS

## 2023-03-07 MED ORDER — SODIUM CHLORIDE 0.9 % IV SOLN: 250.0000 mL | INTRAVENOUS | Status: AC | PRN

## 2023-03-07 MED ORDER — SIMVASTATIN 20 MG PO TABS
20.0000 mg | ORAL_TABLET | Freq: Every day | ORAL | Status: DC
  Administered 2023-03-07 – 2023-03-09 (×3): 20 mg via ORAL
  Filled 2023-03-07 (×3): qty 1

## 2023-03-07 MED ORDER — VENLAFAXINE HCL ER 37.5 MG PO CP24
37.5000 mg | ORAL_CAPSULE | Freq: Every day | ORAL | Status: DC
  Filled 2023-03-07: qty 1

## 2023-03-07 MED ORDER — LORAZEPAM 0.5 MG PO TABS
0.5000 mg | ORAL_TABLET | Freq: Three times a day (TID) | ORAL | Status: DC | PRN
  Administered 2023-03-08: 0.5 mg via ORAL
  Filled 2023-03-07 (×3): qty 1

## 2023-03-07 MED ORDER — ONDANSETRON HCL 4 MG/2ML IJ SOLN: 4.0000 mg | Freq: Four times a day (QID) | INTRAMUSCULAR | Status: DC | PRN

## 2023-03-07 MED ORDER — SODIUM CHLORIDE 0.9% FLUSH
3.0000 mL | Freq: Two times a day (BID) | INTRAVENOUS | Status: DC
  Administered 2023-03-07 – 2023-03-08 (×5): 3 mL via INTRAVENOUS

## 2023-03-07 MED ORDER — ACETAMINOPHEN 650 MG RE SUPP: 650.0000 mg | Freq: Four times a day (QID) | RECTAL | Status: DC | PRN

## 2023-03-07 MED ORDER — DILTIAZEM HCL-DEXTROSE 125-5 MG/125ML-% IV SOLN (PREMIX)
5.0000 mg/h | INTRAVENOUS | Status: DC
  Administered 2023-03-07 – 2023-03-09 (×5): 5 mg/h via INTRAVENOUS
  Filled 2023-03-07 (×4): qty 125

## 2023-03-07 MED ORDER — ONDANSETRON HCL 4 MG PO TABS: 4.0000 mg | ORAL_TABLET | Freq: Four times a day (QID) | ORAL | Status: DC | PRN

## 2023-03-07 MED ORDER — ACETAMINOPHEN 325 MG PO TABS
650.0000 mg | ORAL_TABLET | Freq: Four times a day (QID) | ORAL | Status: DC | PRN
  Administered 2023-03-07 – 2023-03-10 (×3): 650 mg via ORAL
  Filled 2023-03-07 (×3): qty 2

## 2023-03-07 MED ORDER — APIXABAN 5 MG PO TABS
5.0000 mg | ORAL_TABLET | Freq: Two times a day (BID) | ORAL | Status: DC
  Administered 2023-03-07 – 2023-03-08 (×3): 5 mg via ORAL
  Filled 2023-03-07 (×4): qty 1

## 2023-03-07 MED ORDER — BUSPIRONE HCL 5 MG PO TABS
5.0000 mg | ORAL_TABLET | Freq: Two times a day (BID) | ORAL | Status: DC
  Administered 2023-03-07 – 2023-03-10 (×7): 5 mg via ORAL
  Filled 2023-03-07 (×8): qty 1

## 2023-03-07 MED ORDER — SODIUM CHLORIDE 0.9% FLUSH
3.0000 mL | Freq: Two times a day (BID) | INTRAVENOUS | Status: DC
  Administered 2023-03-07 – 2023-03-08 (×4): 3 mL via INTRAVENOUS

## 2023-03-07 MED ORDER — LEVALBUTEROL HCL 0.63 MG/3ML IN NEBU: 0.6300 mg | INHALATION_SOLUTION | Freq: Four times a day (QID) | RESPIRATORY_TRACT | Status: DC | PRN

## 2023-03-07 MED ORDER — DILTIAZEM LOAD VIA INFUSION
10.0000 mg | Freq: Once | INTRAVENOUS | Status: AC
  Administered 2023-03-07: 10 mg via INTRAVENOUS
  Filled 2023-03-07: qty 10

## 2023-03-07 NOTE — ED Provider Notes (Signed)
 [place] EMERGENCY DEPARTMENT AT [facility] Provider Note   CSN: [id number] Arrival date & time: 03/06/23  2325     History  Chief Complaint  Patient presents with   Chest Pain    Shannon [name] is a 73 y.o. female.  Presents to the emergency department for evaluation of chest pain.  Patient has developed pain under the left breast and armpit area today.  Patient reports that she has a history of paroxysmal atrial fibrillation, is on Eliquis.  She goes in and out of A-fib.  She has never responded to cardioversion attempts.  Patient reports that she is wearing a loop recorder and has been told that she has having long sinus pauses when she converts out of A-fib, is scheduled for a pacemaker in 2 weeks.       Home Medications Prior to Admission medications   Medication Sig Start Date End Date Taking? Authorizing Provider  acetaminophen (TYLENOL) 500 MG tablet Take 1,000 mg by mouth every 6 (six) hours as needed for moderate pain or headache.    [provider]  albuterol (VENTOLIN HFA) 108 (90 Base) MCG/ACT inhaler Inhale 1-2 puffs into the lungs every 6 (six) hours as needed for wheezing or shortness of breath. 12/23/19   Parrett, Virgel Bouquet, NP  amLODipine (NORVASC) 5 MG tablet Take 1 tablet (5 mg total) by mouth daily. 01/08/23   Lanier Prude, MD  apixaban (ELIQUIS) 5 MG TABS tablet Take 1 tablet (5 mg total) by mouth 2 (two) times daily. 12/12/22   Lanier Prude, MD  busPIRone (BUSPAR) 5 MG tablet TAKE 1 TABLET BY MOUTH TWICE  DAILY 01/01/23   Eden Emms, NP  Cholecalciferol (VITAMIN D) 50 MCG (2000 UT) CAPS Take 2,000 Units by mouth in the morning.    [provider]  fluticasone (FLONASE) 50 MCG/ACT nasal spray SPRAY 2 SPRAYS INTO EACH NOSTRIL EVERY DAY 01/09/23   Eden Emms, NP  furosemide (LASIX) 20 MG tablet TAKE 1 TABLET BY MOUTH DAILY 03/05/23   Lanier Prude, MD  Hydrocortisone (CORTIZONE-10 EX) Apply 1 application   topically as needed (skin irritation/itching).    [provider]  levalbuterol Pauline Aus HFA) 45 MCG/ACT inhaler Inhale 2 puffs into the lungs every 6 (six) hours as needed for wheezing or shortness of breath. 03/14/22   Eden Emms, NP  loratadine (CLARITIN) 10 MG tablet Take 10 mg by mouth daily as needed for allergies.    [provider]  LORazepam (ATIVAN) 0.5 MG tablet Take 1 tablet (0.5 mg total) by mouth every 8 (eight) hours as needed for anxiety. 01/09/22   Eden Emms, NP  meclizine (ANTIVERT) 12.5 MG tablet Take 1 tablet (12.5 mg total) by mouth 3 (three) times daily as needed for dizziness. 09/02/21   Sloan Leiter, DO  Multiple Vitamin (MULTIVITAMIN) tablet Take 1 tablet by mouth daily.    [provider]  potassium chloride 20 MEQ TBCR Take 1 tablet (20 mEq total) by mouth daily. 02/04/23   Sherie Don, NP  simvastatin (ZOCOR) 20 MG tablet TAKE 1 TABLET BY MOUTH DAILY 08/28/22   Eden Emms, NP  triamcinolone (NASACORT) 55 MCG/ACT AERO nasal inhaler Place 1 spray into the nose daily as needed (allergies).    [provider]  venlafaxine XR (EFFEXOR-XR) 37.5 MG 24 hr capsule TAKE 1 CAPSULE BY MOUTH DAILY  WITH BREAKFAST 01/01/23   Eden Emms, NP      Allergies  Omega-3, Sulfur, Ace inhibitors, Amiodarone, Diclofenac sodium, Elemental sulfur, Hctz [hydrochlorothiazide], Oxycodone, Prednisone, Sulfa antibiotics, and Voltaren [diclofenac sodium]    Review of Systems   Review of Systems  Physical Exam Updated Vital Signs BP (!) 144/101   Pulse (!) 113   Temp 98.1 F (36.7 C)   Resp 19   Ht 5\' 6"  (1.676 m)   Wt 84.5 kg   SpO2 97%   BMI 30.07 kg/m  Physical Exam Vitals and nursing note reviewed.  Constitutional:      General: She is not in acute distress.    Appearance: She is well-developed.  HENT:     Head: Normocephalic and atraumatic.     Mouth/Throat:     Mouth: Mucous membranes are moist.  Eyes:     General: Vision  grossly intact. Gaze aligned appropriately.     Extraocular Movements: Extraocular movements intact.     Conjunctiva/sclera: Conjunctivae normal.  Cardiovascular:     Rate and Rhythm: Tachycardia present. Rhythm irregular.     Pulses: Normal pulses.     Heart sounds: Normal heart sounds, S1 normal and S2 normal. No murmur heard.    No friction rub. No gallop.  Pulmonary:     Effort: Pulmonary effort is normal. No respiratory distress.     Breath sounds: Normal breath sounds.  Abdominal:     General: Bowel sounds are normal.     Palpations: Abdomen is soft.     Tenderness: There is no abdominal tenderness. There is no guarding or rebound.     Hernia: No hernia is present.  Musculoskeletal:        General: No swelling.     Cervical back: Full passive range of motion without pain, normal range of motion and neck supple. No spinous process tenderness or muscular tenderness. Normal range of motion.     Right lower leg: No edema.     Left lower leg: No edema.  Skin:    General: Skin is warm and dry.     Capillary Refill: Capillary refill takes less than 2 seconds.     Findings: No ecchymosis, erythema, rash or wound.  Neurological:     General: No focal deficit present.     Mental Status: She is alert and oriented to person, place, and time.     GCS: GCS eye subscore is 4. GCS verbal subscore is 5. GCS motor subscore is 6.     Cranial Nerves: Cranial nerves 2-12 are intact.     Sensory: Sensation is intact.     Motor: Motor function is intact.     Coordination: Coordination is intact.  Psychiatric:        Attention and Perception: Attention normal.        Mood and Affect: Mood normal.        Speech: Speech normal.        Behavior: Behavior normal.     ED Results / Procedures / Treatments   Labs (all labs ordered are listed, but only abnormal results are displayed) Labs Reviewed  BASIC METABOLIC PANEL - Abnormal; Notable for the following components:      Result Value    Glucose, Bld 137 (*)    All other components within normal limits  TROPONIN I (HIGH SENSITIVITY) - Abnormal; Notable for the following components:   Troponin I (High Sensitivity) 46 (*)    All other components within normal limits  CBC  TROPONIN I (HIGH SENSITIVITY)    EKG None  Radiology DG  Chest Port 1 View Result Date: 03/07/2023 CLINICAL DATA:  Chest pain EXAM: PORTABLE CHEST 1 VIEW COMPARISON:  02/03/2023 FINDINGS: Cardiac shadow is prominent. Loop recorder is again seen. Aortic calcifications are noted. Lungs are well aerated bilaterally. No focal infiltrate or effusion is seen. No bony abnormality is noted. IMPRESSION: No active disease. Electronically Signed   By: Alcide Clever M.D.   On: 03/07/2023 01:12    Procedures Procedures    Medications Ordered in ED Medications  diltiazem (CARDIZEM) 1 mg/mL load via infusion 10 mg (10 mg Intravenous Bolus from Bag 03/07/23 0022)    And  diltiazem (CARDIZEM) 125 mg in dextrose 5% 125 mL (1 mg/mL) infusion (12.5 mg/hr Intravenous Rate/Dose Change 03/07/23 0144)    ED Course/ Medical Decision Making/ A&P                                 Medical Decision Making Amount and/or Complexity of Data Reviewed Radiology: ordered.  Risk Prescription drug management. Decision regarding hospitalization.   Differential Diagnosis considered includes, but not limited to: STEMI; NSTEMI; myocarditis; pericarditis; pulmonary embolism; aortic dissection; pneumothorax; pneumonia; gastritis; musculoskeletal pain  Presents to the emergency department for evaluation of chest pain.  Patient has a complicated cardiac history.  Patient with history of paroxysmal atrial fibrillation and tachybradycardia syndrome.  She has an implanted loop recorder and has been noted recently to be having long sinus pauses.  She is on the schedule for pacemaker on March 13.  Patient reports that she has never responded to any cardioversion attempts including DC  cardioversion.  She normally converts to sinus rhythm on her own.  She was told by her cardiologist, however, that lately her converting from A-fib to sinus is when she is having long sinus pauses which causes her concern.  Based on her refractory status for cardioversion, placed on rate control for A-fib with RVR at presentation.  She has done well.  She is anticoagulated.  Troponin in the range of 40 which is apparently her baseline based on prior labs.  Discussed with Dr. Orson Aloe, on-call for cardiology.  He reports that this would be treated as any other A-fib, rate control, conversion to oral meds if she does not convert and outpatient follow-up.  Does not require any acceleration of her pathway to pacemaker at this time.  Will admit to hospitalist service.        Final Clinical Impression(s) / ED Diagnoses Final diagnoses:  Atrial fibrillation with RVR [facility])    Rx / DC Orders ED Discharge Orders     None         Lawrnce Reyez, Canary Brim, MD 03/07/23 [phone]

## 2023-03-07 NOTE — Progress Notes (Signed)
  RN reported that patient is back and forth atrial fibrillation and in between she converts to sinus rhythm and having sinus pauses 6 to 10-second.  Heart rate between 83-90. Recommended RN to hold the Cardizem drip.  If patient redevelops A-fib RVR in that case will start Cardizem drip. -Informed and discussed with cardiology Dr. Orson Aloe requested to evaluate the patient by cardiology team while patient here in the hospital.  Tereasa Coop, MD [facility] 03/07/2023, 6:11 AM
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
## 2023-03-07 NOTE — ED Notes (Signed)
Report given to [employer].

## 2023-03-07 NOTE — Progress Notes (Signed)
  PROGRESS NOTE  Patient admitted earlier this morning. See H&P.   Patient admitted for A-fib RVR.  She was started on Cardizem drip in the ED.  Cardiology consulted.  Patient with history of tachybradycardia syndrome.  She was recently told to discontinue her Coreg.  Cardiology was consulted.  Notified by RN that patient had episode of pauses, IV Cardizem was discontinued around 6 AM.  Heart rate now up >100.  Patient seen and evaluated.  She is resting in bed, appears comfortable on my evaluation.  Cardiac exam showed irregular rhythm, heart rate up to 110.  Also admitted to some chest pain.  Cardiology has resumed her Cardizem drip.  Continue Eliquis.  Troponin has been elevated but flat and trend.  Status is: Inpatient Remains inpatient appropriate because: A-fib RVR   Noralee Stain, DO [facility] 03/07/2023, 11:05 AM  Available via Epic secure chat 7am-7pm After these hours, please refer to coverage provider listed on [email or web address]

## 2023-03-08 DIAGNOSIS — F411 Generalized anxiety disorder: Secondary | ICD-10-CM | POA: Diagnosis not present

## 2023-03-08 DIAGNOSIS — I4891 Unspecified atrial fibrillation: Secondary | ICD-10-CM | POA: Diagnosis not present

## 2023-03-08 DIAGNOSIS — Z8673 Personal history of transient ischemic attack (TIA), and cerebral infarction without residual deficits: Secondary | ICD-10-CM | POA: Diagnosis not present

## 2023-03-08 DIAGNOSIS — I2489 Other forms of acute ischemic heart disease: Secondary | ICD-10-CM | POA: Diagnosis not present

## 2023-03-08 NOTE — Progress Notes (Signed)
 [facility]                                                                              Shannon [name], is a 73 y.o. female, DOB - Jun 08, 1950, ZOX:[id number] Admit date - 03/06/2023    Outpatient Primary MD for the patient is Toney Reil, Genene Churn, NP  LOS - 1  days  Chief Complaint  Patient presents with   Chest Pain       Brief summary   Patient is a 73 year old female with paroxysmal A-fib, tachycardia-bradycardia syndrome, hypertension, OSA, drug-induced lupus, CVA, generalized anxiety disorder presented with chest discomfort and palpitations.  In ED, patient ported that she has not been taking Coreg as she develops bradycardia and  "completely stopping of her heart" with Coreg and her cardiologist discontinued it.  She had pacemaker placement scheduled on 03/19/2023.  Also reported left-sided lower chest discomfort, no shortness of breath or diaphoresis. In ED, heart rate 135-150 EKG showed A-fib with RVR, heart rate 152.  Patient was placed on Cardizem drip. Cardiology consulted.  Assessment & Plan    Paroxysmal atrial fibrillation, with RVR Tachybradycardia syndrome - Presented to ED with A-fib with RVR, has post ablation x 3, failed dofetilide and amiodarone, had significant pauses postconversion  -Patient was placed on IV Cardizem drip, no rate controlling meds due to sinus pauses -Cardiology, EP cardiology consulted, plan for pacemaker possibly in a.m. -Eliquis held, n.p.o. after midnight    Elevated troponin due to demand ischemia - Troponin [phone]  - elevated troponin in the context of A-fib RVR which is causing demand ischemia - Cardiology following    Essential hypertension - Currently on Cardizem -at home patient takes amlodipine 5 mg twice daily    History of OSA -Continue O2 at bedtime.   History of CVA -Continue Eliquis and simvastatin.   Generalized anxiety disorder Continue BuSpar.   Reactive airway disease Continue Xopenex as  needed  Obesity class II Estimated body mass index is 31.53 kg/m as calculated from the following:   Height as of this encounter: 5\' 6"  (1.676 m).   Weight as of this encounter: 88.6 kg.  Code Status: Full code DVT Prophylaxis:  SCDs Start: 03/07/23 0310 Place TED hose Start: 03/07/23 0310   Level of Care: Level of care: Progressive Family Communication: Updated patient Disposition Plan:      Remains inpatient appropriate: Plan for possible pacemaker tomorrow   Procedures:    Consultants:   Cardiology  Antimicrobials:   Anti-infectives (From admission, onward)    None          Medications  busPIRone  5 mg Oral BID   simvastatin  20 mg Oral q1800   sodium chloride flush  3 mL Intravenous Q12H   sodium chloride flush  3 mL Intravenous Q12H      Subjective:   Shannon [name] was seen and examined today.  No acute complaints, heart rate now controlled, worried about going home and then having continued issues, wondering if pacemaker can be done this admission.  No chest pain, no acute shortness of breath, fever chills nausea vomiting.   Objective:   Vitals:  03/07/23 2313 03/08/23 0417 03/08/23 0725 03/08/23 0900  BP: 138/65 (!) 157/80 (!) 152/77 (!) 149/83  Pulse: 66 60 79   Resp: 18 18 16    Temp: 98.7 F (37.1 C) 97.7 F (36.5 C) 98 F (36.7 C)   TempSrc: Oral Oral Oral   SpO2: 95% 95%    Weight:      Height:        Intake/Output Summary (Last 24 hours) at 03/08/2023 1102 Last data filed at 03/08/2023 0301 Gross per 24 hour  Intake 397.45 ml  Output --  Net 397.45 ml     Wt Readings from Last 3 Encounters:  03/07/23 88.6 kg  02/27/23 84.5 kg  02/04/23 89.4 kg     Exam General: Alert and oriented x 3, NAD Cardiovascular: S1 S2 auscultated,  RRR Respiratory: Clear to auscultation bilaterally Gastrointestinal: Soft, nontender, nondistended, + bowel sounds Ext: no pedal edema bilaterally Neuro: no new deficits Psych: Normal affect      Data Reviewed:  I have personally reviewed following labs    CBC Lab Results  Component Value Date   WBC 9.6 03/07/2023   RBC 4.52 03/07/2023   HGB 13.3 03/07/2023   HCT 41.3 03/07/2023   MCV 91.4 03/07/2023   MCH 29.4 03/07/2023   PLT 318 03/07/2023   MCHC 32.2 03/07/2023   RDW 14.2 03/07/2023   LYMPHSABS 1.5 02/27/2023   MONOABS 0.8 02/03/2023   EOSABS 0.3 02/27/2023   BASOSABS 0.1 02/27/2023     Last metabolic panel Lab Results  Component Value Date   NA 140 03/07/2023   K 3.6 03/07/2023   CL 102 03/07/2023   CO2 26 03/07/2023   BUN 11 03/07/2023   CREATININE 0.61 03/07/2023   GLUCOSE 124 (H) 03/07/2023   GFRNONAA >60 03/07/2023   GFRAA 70 01/16/2020   CALCIUM 9.2 03/07/2023   PHOS 4.6 11/26/2015   PROT 7.5 03/07/2023   ALBUMIN 3.9 03/07/2023   BILITOT 0.5 03/07/2023   ALKPHOS 79 03/07/2023   AST 22 03/07/2023   ALT 21 03/07/2023   ANIONGAP 12 03/07/2023    CBG (last 3)  No results for input(s): "GLUCAP" in the last 72 hours.    Coagulation Profile: No results for input(s): "INR", "PROTIME" in the last 168 hours.   Radiology Studies: I have personally reviewed the imaging studies  DG Chest Port 1 View Result Date: 03/07/2023 CLINICAL DATA:  Chest pain EXAM: PORTABLE CHEST 1 VIEW COMPARISON:  02/03/2023 FINDINGS: Cardiac shadow is prominent. Loop recorder is again seen. Aortic calcifications are noted. Lungs are well aerated bilaterally. No focal infiltrate or effusion is seen. No bony abnormality is noted. IMPRESSION: No active disease. Electronically Signed   By: Alcide Clever M.D.   On: 03/07/2023 01:12       Nolyn Eilert M.D. [facility] 03/08/2023, 11:02 AM  Available via Epic secure chat 7am-7pm After 7 pm, please refer to night coverage provider listed on amion.

## 2023-03-08 NOTE — Plan of Care (Signed)

## 2023-03-08 NOTE — Progress Notes (Signed)
 Rounding Note    Patient Name: Takoda Siedlecki Date of Encounter: 03/08/2023  [facility] Cardiologist: None   Subjective   Feeling well today.  Has converted to sinus rhythm.  Did not have a pause with conversion.  Inpatient Medications    Scheduled Meds:  busPIRone  5 mg Oral BID   simvastatin  20 mg Oral q1800   sodium chloride flush  3 mL Intravenous Q12H   sodium chloride flush  3 mL Intravenous Q12H   Continuous Infusions:  diltiazem (CARDIZEM) infusion 5 mg/hr (03/08/23 0301)   PRN Meds: acetaminophen **OR** acetaminophen, bisacodyl, levalbuterol, LORazepam, ondansetron **OR** ondansetron (ZOFRAN) IV, sodium chloride flush   Vital Signs    Vitals:   03/07/23 2052 03/07/23 2313 03/08/23 0417 03/08/23 0725  BP: 135/65 138/65 (!) 157/80 (!) 152/77  Pulse: 80 66 60 79  Resp: 18 18 18 16   Temp: 98.2 F (36.8 C) 98.7 F (37.1 C) 97.7 F (36.5 C) 98 F (36.7 C)  TempSrc: Oral Oral Oral Oral  SpO2: 95% 95% 95%   Weight:      Height:        Intake/Output Summary (Last 24 hours) at 03/08/2023 0910 Last data filed at 03/08/2023 0301 Gross per 24 hour  Intake 397.45 ml  Output --  Net 397.45 ml      03/07/2023    6:05 AM 03/06/2023   11:40 PM 02/27/2023    9:40 AM  Last 3 Weights  Weight (lbs) 195 lb 5.2 oz 186 lb 4.6 oz 186 lb 3.2 oz  Weight (kg) 88.6 kg 84.5 kg 84.46 kg      Telemetry    Atrial fibrillation and sinus rhythm- Personally Reviewed  ECG     - Personally Reviewed  Physical Exam   GEN: No acute distress.   Neck: No JVD Cardiac: RRR, no murmurs, rubs, or gallops.  Respiratory: Clear to auscultation bilaterally. GI: Soft, nontender, non-distended  MS: No edema; No deformity. Neuro:  Nonfocal  Psych: Normal affect   Labs    High Sensitivity Troponin:   Recent Labs  Lab 03/06/23 2350 03/07/23 0243 03/07/23 0535 03/07/23 0935  TROPONINIHS 46* 59* 56* 62*     Chemistry Recent Labs  Lab 03/06/23 2350  03/07/23 0535  NA 139 140  K 3.6 3.6  CL 102 102  CO2 24 26  GLUCOSE 137* 124*  BUN 15 11  CREATININE 0.65 0.61  CALCIUM 9.7 9.2  PROT  --  7.5  ALBUMIN  --  3.9  AST  --  22  ALT  --  21  ALKPHOS  --  79  BILITOT  --  0.5  GFRNONAA >60 >60  ANIONGAP 13 12    Lipids No results for input(s): "CHOL", "TRIG", "HDL", "LABVLDL", "LDLCALC", "CHOLHDL" in the last 168 hours.  Hematology Recent Labs  Lab 03/06/23 2350 03/07/23 0535  WBC 8.3 9.6  RBC 4.50 4.52  HGB 13.0 13.3  HCT 40.5 41.3  MCV 90.0 91.4  MCH 28.9 29.4  MCHC 32.1 32.2  RDW 14.1 14.2  PLT 299 318   Thyroid  Recent Labs  Lab 03/07/23 0535  TSH 1.156    BNPNo results for input(s): "BNP", "PROBNP" in the last 168 hours.  DDimer No results for input(s): "DDIMER" in the last 168 hours.   Radiology    DG Chest Port 1 View Result Date: 03/07/2023 CLINICAL DATA:  Chest pain EXAM: PORTABLE CHEST 1 VIEW COMPARISON:  02/03/2023 FINDINGS: Cardiac shadow  is prominent. Loop recorder is again seen. Aortic calcifications are noted. Lungs are well aerated bilaterally. No focal infiltrate or effusion is seen. No bony abnormality is noted. IMPRESSION: No active disease. Electronically Signed   By: Alcide Clever M.D.   On: 03/07/2023 01:12    Cardiac Studies     Patient Profile     73 y.o. female with a history of atrial fibrillation post multiple cardioversions presents to the hospital with atrial fibrillation and tachybradycardia syndrome.  Assessment & Plan    1.  Paroxysmal atrial fibrillation: Post ablation x 3.  She has failed dofetilide and amiodarone.  She has had significant pauses postconversion.  She is back in normal rhythm.  Continue with current management.  2.  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  She has not had pauses in the hospital with conversion, but she would prefer to move her pacemaker implant potentially up to tomorrow.  She  Gwen Sarvis discuss this further with her daughter.  Lavonne Kinderman keep her n.p.o. tonight after midnight.  Pasty Manninen hold Eliquis.  3.  Hypertension: Mildly elevated.  Has been well-controlled at the hospital.  No changes.     For questions or updates, please contact [facility] Please consult [email or web address] for contact info under        Signed, Shawna Wearing Jorja Loa, MD  03/08/2023, 9:10 AM

## 2023-03-09 ENCOUNTER — Encounter: Payer: Medicare Other | Admitting: Cardiology

## 2023-03-09 ENCOUNTER — Encounter (HOSPITAL_COMMUNITY)
Admission: EM | Disposition: A | Discharge status: Home / Self Care | Payer: Self-pay | Source: Home / Self Care | Attending: Internal Medicine

## 2023-03-09 ENCOUNTER — Encounter (HOSPITAL_COMMUNITY): Payer: Self-pay | Admitting: Cardiology

## 2023-03-09 DIAGNOSIS — G4733 Obstructive sleep apnea (adult) (pediatric): Secondary | ICD-10-CM | POA: Diagnosis not present

## 2023-03-09 DIAGNOSIS — I495 Sick sinus syndrome: Secondary | ICD-10-CM | POA: Diagnosis not present

## 2023-03-09 DIAGNOSIS — I480 Paroxysmal atrial fibrillation: Secondary | ICD-10-CM | POA: Diagnosis not present

## 2023-03-09 DIAGNOSIS — I4891 Unspecified atrial fibrillation: Secondary | ICD-10-CM | POA: Diagnosis not present

## 2023-03-09 HISTORY — PX: PACEMAKER IMPLANT: EP1218

## 2023-03-09 HISTORY — PX: LOOP RECORDER REMOVAL: EP1215

## 2023-03-09 LAB — CBC
HCT: 37.1 % (ref 36.0–46.0)
Hemoglobin: 11.9 g/dL — ABNORMAL LOW (ref 12.0–15.0)
MCH: 29.3 pg (ref 26.0–34.0)
MCHC: 32.1 g/dL (ref 30.0–36.0)
MCV: 91.4 fL (ref 80.0–100.0)
Platelets: 286 10*3/uL (ref 150–400)
RBC: 4.06 MIL/uL (ref 3.87–5.11)
RDW: 14.1 % (ref 11.5–15.5)
WBC: 8.7 10*3/uL (ref 4.0–10.5)
nRBC: 0 % (ref 0.0–0.2)

## 2023-03-09 LAB — RENAL FUNCTION PANEL
Albumin: 3.2 g/dL — ABNORMAL LOW (ref 3.5–5.0)
Anion gap: 9 (ref 5–15)
BUN: 19 mg/dL (ref 8–23)
CO2: 26 mmol/L (ref 22–32)
Calcium: 9.2 mg/dL (ref 8.9–10.3)
Chloride: 103 mmol/L (ref 98–111)
Creatinine, Ser: 0.86 mg/dL (ref 0.44–1.00)
GFR, Estimated: >60 mL/min (ref >60)
Glucose, Bld: 91 mg/dL (ref 70–99)
Phosphorus: 3.9 mg/dL (ref 2.5–4.6)
Potassium: 4 mmol/L (ref 3.5–5.1)
Sodium: 138 mmol/L (ref 135–145)

## 2023-03-09 LAB — SURGICAL PCR SCREEN
MRSA, PCR: NEGATIVE
Staphylococcus aureus: NEGATIVE

## 2023-03-09 SURGERY — PACEMAKER IMPLANT

## 2023-03-09 MED ORDER — FENTANYL CITRATE (PF) 100 MCG/2ML IJ SOLN
INTRAMUSCULAR | Status: AC
  Filled 2023-03-09: qty 2

## 2023-03-09 MED ORDER — FENTANYL CITRATE (PF) 100 MCG/2ML IJ SOLN
INTRAMUSCULAR | Status: DC | PRN
  Administered 2023-03-09: 25 ug via INTRAVENOUS
  Administered 2023-03-09: 50 ug via INTRAVENOUS

## 2023-03-09 MED ORDER — SODIUM CHLORIDE 0.9% FLUSH: 3.0000 mL | INTRAVENOUS | Status: DC | PRN

## 2023-03-09 MED ORDER — SODIUM CHLORIDE 0.9 % IV SOLN: INTRAVENOUS | Status: DC

## 2023-03-09 MED ORDER — LIDOCAINE HCL 1 % IJ SOLN
INTRAMUSCULAR | Status: AC
Start: 2023-03-09 — End: ?
  Filled 2023-03-09: qty 60

## 2023-03-09 MED ORDER — CHLORHEXIDINE GLUCONATE 4 % EX SOLN: 60.0000 mL | Freq: Once | CUTANEOUS | Status: DC

## 2023-03-09 MED ORDER — VENLAFAXINE HCL ER 37.5 MG PO CP24
37.5000 mg | ORAL_CAPSULE | Freq: Every day | ORAL | Status: DC
  Filled 2023-03-09: qty 1

## 2023-03-09 MED ORDER — SODIUM CHLORIDE 0.9 % IV SOLN
INTRAVENOUS | Status: AC
Start: 2023-03-09 — End: 2023-03-10
  Filled 2023-03-09: qty 2

## 2023-03-09 MED ORDER — CEFAZOLIN SODIUM-DEXTROSE 2-4 GM/100ML-% IV SOLN
INTRAVENOUS | Status: AC
  Filled 2023-03-09: qty 100

## 2023-03-09 MED ORDER — SODIUM CHLORIDE 0.9 % IV SOLN
80.0000 mg | INTRAVENOUS | Status: AC
  Administered 2023-03-09: 80 mg

## 2023-03-09 MED ORDER — HEPARIN (PORCINE) IN NACL 1000-0.9 UT/500ML-% IV SOLN
INTRAVENOUS | Status: DC | PRN
  Administered 2023-03-09: 500 mL

## 2023-03-09 MED ORDER — MIDAZOLAM HCL 5 MG/5ML IJ SOLN
INTRAMUSCULAR | Status: AC
  Filled 2023-03-09: qty 5

## 2023-03-09 MED ORDER — CEFAZOLIN SODIUM-DEXTROSE 2-4 GM/100ML-% IV SOLN
2.0000 g | INTRAVENOUS | Status: AC
  Administered 2023-03-09: 2 g via INTRAVENOUS

## 2023-03-09 MED ORDER — SODIUM CHLORIDE 0.9% FLUSH
3.0000 mL | Freq: Two times a day (BID) | INTRAVENOUS | Status: DC
  Administered 2023-03-10: 3 mL via INTRAVENOUS

## 2023-03-09 MED ORDER — MIDAZOLAM HCL 5 MG/5ML IJ SOLN
INTRAMUSCULAR | Status: DC | PRN
  Administered 2023-03-09: .5 mg via INTRAVENOUS
  Administered 2023-03-09: 1 mg via INTRAVENOUS

## 2023-03-09 MED ORDER — SODIUM CHLORIDE 0.9 % IV SOLN: 250.0000 mL | INTRAVENOUS | Status: DC

## 2023-03-09 MED ORDER — HYDROMORPHONE HCL 1 MG/ML IJ SOLN: 0.5000 mg | INTRAMUSCULAR | Status: DC | PRN

## 2023-03-09 MED ORDER — LIDOCAINE HCL (PF) 1 % IJ SOLN
INTRAMUSCULAR | Status: DC | PRN
  Administered 2023-03-09: 30 mL

## 2023-03-09 SURGICAL SUPPLY — 14 items
CABLE SURGICAL S-101-97-12 (CABLE) ×1 IMPLANT
CATH RIGHTSITE C315HIS02 (CATHETERS) IMPLANT
IPG PACE AZUR XT DR MRI W1DR01 (Pacemaker) IMPLANT
LEAD CAPSURE NOVUS 5076-52CM (Lead) IMPLANT
LEAD SELECT SECURE 3830 383069 (Lead) IMPLANT
PACE AZURE XT DR MRI W1DR01 (Pacemaker) ×1 IMPLANT
PACK LOOP INSERTION (CUSTOM PROCEDURE TRAY) ×1 IMPLANT
PAD DEFIB RADIO PHYSIO CONN (PAD) ×1 IMPLANT
SELECT SECURE 3830 383069 (Lead) ×1 IMPLANT
SHEATH 7FR PRELUDE SNAP 13 (SHEATH) IMPLANT
SHEATH PROBE COVER 6X72 (BAG) IMPLANT
SLITTER 6232ADJ (MISCELLANEOUS) IMPLANT
TRAY PACEMAKER INSERTION (PACKS) ×1 IMPLANT
WIRE HI TORQ VERSACORE-J 145CM (WIRE) IMPLANT

## 2023-03-09 NOTE — Progress Notes (Addendum)
 Rounding Note    Patient Name: Shannon [name] Date of Encounter: 03/09/2023  [facility] Cardiologist: Dr. Lalla [name]  Subjective   Feels well in SR  Inpatient Medications    Scheduled Meds:  busPIRone  5 mg Oral BID   simvastatin  20 mg Oral q1800   sodium chloride flush  3 mL Intravenous Q12H   sodium chloride flush  3 mL Intravenous Q12H   Continuous Infusions:  diltiazem (CARDIZEM) infusion 5 mg/hr (03/08/23 2009)   PRN Meds: acetaminophen **OR** acetaminophen, bisacodyl, levalbuterol, LORazepam, ondansetron **OR** ondansetron (ZOFRAN) IV, sodium chloride flush   Vital Signs    Vitals:   03/08/23 0417 03/08/23 0725 03/08/23 0900 03/08/23 1252  BP: (!) 157/80 (!) 152/77 (!) 149/83 134/81  Pulse: 60 79  77  Resp: 18 16  16   Temp: 97.7 F (36.5 C) 98 F (36.7 C)  98.2 F (36.8 C)  TempSrc: Oral Oral  Oral  SpO2: 95%     Weight:      Height:       No intake or output data in the 24 hours ending 03/09/23 0952    03/07/2023    6:05 AM 03/06/2023   11:40 PM 02/27/2023    9:40 AM  Last 3 Weights  Weight (lbs) 195 lb 5.2 oz 186 lb 4.6 oz 186 lb 3.2 oz  Weight (kg) 88.6 kg 84.5 kg 84.46 kg      Telemetry    SR 70's. Brief WCT is irregular - Personally Reviewed  ECG    No new EKGs  - Personally Reviewed  Physical Exam   GEN: No acute distress.   Neck: No JVD Cardiac: RRR, no murmurs, rubs, or gallops.  Respiratory: CTA b/l. GI: Soft, nontender, non-distended  MS: No edema; No deformity. Neuro:  Nonfocal  Psych: Normal affect   Labs    High Sensitivity Troponin:   Recent Labs  Lab 03/06/23 2350 03/07/23 0243 03/07/23 0535 03/07/23 0935  TROPONINIHS 46* 59* 56* 62*     Chemistry Recent Labs  Lab 03/06/23 2350 03/07/23 0535 03/09/23 0440  NA 139 140 138  K 3.6 3.6 4.0  CL 102 102 103  CO2 24 26 26   GLUCOSE 137* 124* 91  BUN 15 11 19   CREATININE 0.65 0.61 0.86  CALCIUM 9.7 9.2 9.2  PROT  --  7.5  --   ALBUMIN  --  3.9  3.2*  AST  --  22  --   ALT  --  21  --   ALKPHOS  --  79  --   BILITOT  --  0.5  --   GFRNONAA >60 >60 >60  ANIONGAP 13 12 9     Lipids No results for input(s): "CHOL", "TRIG", "HDL", "LABVLDL", "LDLCALC", "CHOLHDL" in the last 168 hours.  Hematology Recent Labs  Lab 03/06/23 2350 03/07/23 0535 03/09/23 0440  WBC 8.3 9.6 8.7  RBC 4.50 4.52 4.06  HGB 13.0 13.3 11.9*  HCT 40.5 41.3 37.1  MCV 90.0 91.4 91.4  MCH 28.9 29.4 29.3  MCHC 32.1 32.2 32.1  RDW 14.1 14.2 14.1  PLT 299 318 286   Thyroid  Recent Labs  Lab 03/07/23 0535  TSH 1.156    BNPNo results for input(s): "BNP", "PROBNP" in the last 168 hours.  DDimer No results for input(s): "DDIMER" in the last 168 hours.   Radiology    No results found.  Cardiac Studies   10/23/22: TTE 1. Left ventricular ejection fraction, by estimation,  is 60 to 65%. The  left ventricle has normal function. The left ventricle has no regional  wall motion abnormalities. There is mild concentric left ventricular  hypertrophy. Left ventricular diastolic  parameters are indeterminate.   2. Right ventricular systolic function is normal. The right ventricular  size is normal.   3. Left atrial size was moderately dilated.   4. Right atrial size was moderately dilated.   5. The mitral valve is normal in structure. Mild to moderate mitral valve  regurgitation. No evidence of mitral stenosis.   6. The aortic valve is tricuspid. There is mild calcification of the  aortic valve. Aortic valve regurgitation is not visualized. Aortic valve  sclerosis/calcification is present, without any evidence of aortic  stenosis.   7. The inferior vena cava is normal in size with greater than 50%  respiratory variability, suggesting right atrial pressure of 3 mmHg.   Patient Profile     73 y.o. 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 Lalla [name]  02/27/23, loop recently revealed post conversion pauses (as long as 5 seconds), initially asymptomatic though recently with reports of near syncope, felt awful, planned for PPM for tachy-brady (03/19/23) Her home coreg stopped  ADMITTED now over the weekend with symptomatic AFib, feels weak Started on dilt gtt and had spontaneous CV to sinus (without pause)    Device iformation: MDT [name], implanted 03/07/2019: Afib surveillence   AFib hx Diagnosed 2017 PVI ablation 03/25/2016 PVI ablation 01/31/20 STROKE on compliant and appropriately dosed Xarelto 02/2021 > Eliquis PVI ablation 12/13/21   AAD Hx Flecainide 2017 >> stopped May 2018 2/2 c/o fatigue  >> resumed Jan 2020 with recurrent AF episodes >> stopped April 2022, stopped daily use ablation >> PRN >> off Tikosyn started Dec 2023 >> stopped during load 2/2 QT prolongation Amiodarone started Dec 2023 > intolerant w/ blurred vision, fatigue, SOB, mood swings.....   Assessment & Plan    Tachy-brady Planned for PPM implant/loop removal today She remains agreeable  Paroxysmal Afib Off Eliquis here Hx of stroke >>> will minimize off OAC as brief as able Resume post pacing, pending pocket stability  For questions or updates, please contact [facility] Please consult [email or web address] for contact info under        Signed, Shannon Pigeon, PA-C  03/09/2023, 9:52 AM     I have seen, examined the patient, and reviewed the above assessment and plan.    Interval: No acute overnight events. Patient appears to be doing well. She has no new or acute complaints this morning.   GEN: No acute distress.   Cardiac: Normal rate and regular rhythm. Resp: Normal work of breathing.  Ext: No edema.  Psych: Normal affect   Tele: Sinus rhythm  Assessment: Shannon [name] is a 73 y.o. 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 She was planned for outpatient pacemaker implant for tachycardia-bradycardia syndrome in the setting of 5 second post conversion pauses.   Plan:  #. 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  -Plan for dual chamber pacemaker implant and loop recorder explant today. Explained risks, benefits, and alternatives to pacemaker implantation and loop removal, including but not limited to bleeding, infection, damage to heart or lungs, heart attack, stroke, or death.  Pt verbalized understanding and agrees to  proceed.   #. Paroxysmal atrial fibrillation: S/p multiple ablations and failed AADs.  #. Secondary hypercoagulable state due to atrial fibrillation: Prior stroke on Xarelto.  - Eliquis currently held. Will tentatively plan to resume in 48 hours.    [name], MD 03/09/2023 3:26 PM

## 2023-03-09 NOTE — TOC Initial Note (Signed)
 Transition of Care [facility]) - Initial/Assessment Note    Patient Details  Name: Shannon [name] MRN: [id number] Date of Birth: 12-17-1950  [facility] CM/SW Contact:    Gala Lewandowsky, RN Phone Number: 03/09/2023, 4:37 PM  Clinical Narrative: Patient presented for Atrial Fib-plan for PPM today. PTA patient was from home with daughter. Patient has DME rolling walker; however does not need it at this time. Patient was driving up to two weeks ago and now daughter drives to appointments. Case Manager will continue to follow for additional transition of care needs.                     Expected Discharge Plan: Home/Self Care Barriers to Discharge: Continued Medical Work up   Patient Goals and CMS Choice Patient states their goals for this hospitalization and ongoing recovery are:: to return home once stable.  Expected Discharge Plan and Services   Discharge Planning Services: CM Consult   Living arrangements for the past 2 months: Single Family Home  Prior Living Arrangements/Services Living arrangements for the past 2 months: Single Family Home Lives with:: Adult Children Patient language and need for interpreter reviewed:: Yes Do you feel safe going back to the place where you live?: Yes      Need for Family Participation in Patient Care: No (Comment) Care giver support system in place?: No (comment) Current home services: DME (rolling walker.) Criminal Activity/Legal Involvement Pertinent to Current Situation/Hospitalization: No - Comment as needed  Activities of Daily Living   ADL Screening (condition at time of admission) Independently performs ADLs?: Yes (appropriate for developmental age) Is the patient deaf or have difficulty hearing?: No Does the patient have difficulty seeing, even when wearing glasses/contacts?: No Does the patient have difficulty concentrating, remembering, or making decisions?: No  Permission Sought/Granted Permission  sought to share information with : Family Supports, Case Manager     Emotional Assessment Appearance:: Appears stated age Attitude/Demeanor/Rapport: Engaged Affect (typically observed): Appropriate Orientation: : Oriented to Self, Oriented to Place, Oriented to  Time, Oriented to Situation Alcohol / Substance Use: Not Applicable Psych Involvement: No (comment)  Admission diagnosis:  Atrial fibrillation with RVR (HCC) [I48.91] Patient Active Problem List   Diagnosis Date Noted   Atrial fibrillation with RVR (HCC) 03/07/2023   History of tachycardia-bradycardia syndrome (HCC) 03/07/2023   Demand ischemia (HCC) 03/07/2023   PND (post-nasal drip) 12/18/2022   Sinus pressure 12/18/2022   Obesity (BMI 30-39.9) 11/07/2022   Foot swelling 05/19/2022   Hospital discharge follow-up 03/14/2022   Current severe episode of major depressive disorder without psychotic features without prior episode (HCC) 03/06/2022   GAD (generalized anxiety disorder) 03/06/2022   Caregiver role strain 03/06/2022   Headache, unspecified headache type 03/06/2022   Exposure to COVID-19 virus 03/06/2022   COVID-19 03/06/2022   Adjustment disorder with mixed anxiety and depressed mood 03/03/2022   Diplopia 01/15/2022   History of CVA (cerebrovascular accident) 01/15/2022   Paroxysmal atrial fibrillation (HCC) 12/13/2021   Thoracic spine pain 11/22/2021   Tinnitus of both ears 11/22/2021   Bilateral lower extremity edema 11/22/2021   Acute CVA (cerebrovascular accident) (HCC) 02/06/2021   Preventative health care 07/15/2020   Dyslipidemia, goal LDL below 130 07/10/2020   Iron deficiency anemia 07/10/2020   Shortness of breath 12/23/2019   Asthma 12/23/2019   Morbid obesity (HCC) 09/11/2017   Chronic fatigue 05/27/2016   A-fib (HCC) 03/25/2016   Typical atrial flutter (HCC)    Chronic diastolic CHF (  congestive heart failure) (HCC) 12/08/2015   Thoracic compression fracture (HCC) 11/16/2015   Orthostatic  hypotension 11/15/2015   Osteopenia 09/12/2015   Vitamin D deficiency disease 08/29/2015   Polymyalgia rheumatica (HCC) 04/18/2015   OSA (obstructive sleep apnea) 03/04/2015   Myalgia 11/22/2014   Primary hypertension 08/21/2014   Anxiety and depression 08/21/2014   PCP:  Eden Emms, NP Pharmacy:   [facility] No address on file   [facility] - [place], [place] - [address] [place] [address] Phone: [phone] Fax: [phone]  [facility] [address] [place] - [address] [place] [address] Phone: [phone] Fax: [phone]     Social Drivers of Health (SDOH) Social History: SDOH Screenings   Food Insecurity: No Food Insecurity (03/07/2023)  Housing: Low Risk  (03/07/2023)  Transportation Needs: No Transportation Needs (03/07/2023)  Utilities: Not At Risk (03/07/2023)  Alcohol Screen: Low Risk  (11/03/2022)  Depression (PHQ2-9): Low Risk  (12/18/2022)  Recent Concern: Depression (PHQ2-9) - Medium Risk (11/07/2022)  Financial Resource Strain: Low Risk  (12/18/2022)  Recent Concern: Financial Resource Strain - Medium Risk (11/03/2022)  Physical Activity: Inactive (12/18/2022)  Social Connections: Moderately Isolated (03/07/2023)  Stress: No Stress Concern Present (12/18/2022)  Recent Concern: Stress - Stress Concern Present (11/03/2022)  Tobacco Use: Medium Risk (03/07/2023)   SDOH Interventions: Social Connections Interventions: Intervention Not Indicated   Readmission Risk Interventions     No data to display

## 2023-03-09 NOTE — Interval H&P Note (Signed)
 History and Physical Interval Note:  03/09/2023 3:31 PM  Shannon [name]  has presented today for surgery, with the diagnosis of atrial fibrillation, sinus node dysfunction and tachycardia-bradycardia syndrome.  The various methods of treatment have been discussed with the patient and family. After consideration of risks, benefits and other options for treatment, the patient has consented to  Procedure(s): PACEMAKER IMPLANT (N/A) LOOP RECORDER REMOVAL (N/A) as a surgical intervention.  The patient's history has been reviewed, patient examined, no change in status, stable for surgery.  I have reviewed the patient's chart and labs.  Questions were answered to the patient's satisfaction.     Shannon [name]

## 2023-03-09 NOTE — H&P (View-Only) (Signed)
 Rounding Note    Patient Name: Shannon [name] Date of Encounter: 03/09/2023  [facility] Cardiologist: Dr. Lalla Brothers  Subjective   Feels well in SR  Inpatient Medications    Scheduled Meds:  busPIRone  5 mg Oral BID   simvastatin  20 mg Oral q1800   sodium chloride flush  3 mL Intravenous Q12H   sodium chloride flush  3 mL Intravenous Q12H   Continuous Infusions:  diltiazem (CARDIZEM) infusion 5 mg/hr (03/08/23 2009)   PRN Meds: acetaminophen **OR** acetaminophen, bisacodyl, levalbuterol, LORazepam, ondansetron **OR** ondansetron (ZOFRAN) IV, sodium chloride flush   Vital Signs    Vitals:   03/08/23 0417 03/08/23 0725 03/08/23 0900 03/08/23 1252  BP: (!) 157/80 (!) 152/77 (!) 149/83 134/81  Pulse: 60 79  77  Resp: 18 16  16   Temp: 97.7 F (36.5 C) 98 F (36.7 C)  98.2 F (36.8 C)  TempSrc: Oral Oral  Oral  SpO2: 95%     Weight:      Height:       No intake or output data in the 24 hours ending 03/09/23 0952    03/07/2023    6:05 AM 03/06/2023   11:40 PM 02/27/2023    9:40 AM  Last 3 Weights  Weight (lbs) 195 lb 5.2 oz 186 lb 4.6 oz 186 lb 3.2 oz  Weight (kg) 88.6 kg 84.5 kg 84.46 kg      Telemetry    SR 70's. Brief WCT is irregular - Personally Reviewed  ECG    No new EKGs  - Personally Reviewed  Physical Exam   GEN: No acute distress.   Neck: No JVD Cardiac: RRR, no murmurs, rubs, or gallops.  Respiratory: CTA b/l. GI: Soft, nontender, non-distended  MS: No edema; No deformity. Neuro:  Nonfocal  Psych: Normal affect   Labs    High Sensitivity Troponin:   Recent Labs  Lab 03/06/23 2350 03/07/23 0243 03/07/23 0535 03/07/23 0935  TROPONINIHS 46* 59* 56* 62*     Chemistry Recent Labs  Lab 03/06/23 2350 03/07/23 0535 03/09/23 0440  NA 139 140 138  K 3.6 3.6 4.0  CL 102 102 103  CO2 24 26 26   GLUCOSE 137* 124* 91  BUN 15 11 19   CREATININE 0.65 0.61 0.86  CALCIUM 9.7 9.2 9.2  PROT  --  7.5  --   ALBUMIN  --  3.9  3.2*  AST  --  22  --   ALT  --  21  --   ALKPHOS  --  79  --   BILITOT  --  0.5  --   GFRNONAA >60 >60 >60  ANIONGAP 13 12 9     Lipids No results for input(s): "CHOL", "TRIG", "HDL", "LABVLDL", "LDLCALC", "CHOLHDL" in the last 168 hours.  Hematology Recent Labs  Lab 03/06/23 2350 03/07/23 0535 03/09/23 0440  WBC 8.3 9.6 8.7  RBC 4.50 4.52 4.06  HGB 13.0 13.3 11.9*  HCT 40.5 41.3 37.1  MCV 90.0 91.4 91.4  MCH 28.9 29.4 29.3  MCHC 32.1 32.2 32.1  RDW 14.1 14.2 14.1  PLT 299 318 286   Thyroid  Recent Labs  Lab 03/07/23 0535  TSH 1.156    BNPNo results for input(s): "BNP", "PROBNP" in the last 168 hours.  DDimer No results for input(s): "DDIMER" in the last 168 hours.   Radiology    No results found.  Cardiac Studies   10/23/22: TTE 1. Left ventricular ejection fraction, by estimation,  is 60 to 65%. The  left ventricle has normal function. The left ventricle has no regional  wall motion abnormalities. There is mild concentric left ventricular  hypertrophy. Left ventricular diastolic  parameters are indeterminate.   2. Right ventricular systolic function is normal. The right ventricular  size is normal.   3. Left atrial size was moderately dilated.   4. Right atrial size was moderately dilated.   5. The mitral valve is normal in structure. Mild to moderate mitral valve  regurgitation. No evidence of mitral stenosis.   6. The aortic valve is tricuspid. There is mild calcification of the  aortic valve. Aortic valve regurgitation is not visualized. Aortic valve  sclerosis/calcification is present, without any evidence of aortic  stenosis.   7. The inferior vena cava is normal in size with greater than 50%  respiratory variability, suggesting right atrial pressure of 3 mmHg.   Patient Profile     73 y.o. 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 Lalla Brothers  02/27/23, loop recently revealed post conversion pauses (as long as 5 seconds), initially asymptomatic though recently with reports of near syncope, felt awful, planned for PPM for tachy-brady (03/19/23) Her home coreg stopped  ADMITTED now over the weekend with symptomatic AFib, feels weak Started on dilt gtt and had spontaneous CV to sinus (without pause)    Device iformation: MDT [name], implanted 03/07/2019: Afib surveillence   AFib hx Diagnosed 2017 PVI ablation 03/25/2016 PVI ablation 01/31/20 STROKE on compliant and appropriately dosed Xarelto 02/2021 > Eliquis PVI ablation 12/13/21   AAD Hx Flecainide 2017 >> stopped May 2018 2/2 c/o fatigue  >> resumed Jan 2020 with recurrent AF episodes >> stopped April 2022, stopped daily use ablation >> PRN >> off Tikosyn started Dec 2023 >> stopped during load 2/2 QT prolongation Amiodarone started Dec 2023 > intolerant w/ blurred vision, fatigue, SOB, mood swings.....   Assessment & Plan    Tachy-brady Planned for PPM implant/loop removal today She remains agreeable  Paroxysmal Afib Off Eliquis here Hx of stroke >>> will minimize off OAC as brief as able Resume post pacing, pending pocket stability  For questions or updates, please contact [facility] Please consult [email or web address] for contact info under        Signed, Shannon Pigeon, PA-C  03/09/2023, 9:52 AM     I have seen, examined the patient, and reviewed the above assessment and plan.    Interval: No acute overnight events. Patient appears to be doing well. She has no new or acute complaints this morning.   GEN: No acute distress.   Cardiac: Normal rate and regular rhythm. Resp: Normal work of breathing.  Ext: No edema.  Psych: Normal affect   Tele: Sinus rhythm  Assessment: Shannon [name] is a 73 y.o. 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 She was planned for outpatient pacemaker implant for tachycardia-bradycardia syndrome in the setting of 5 second post conversion pauses.   Plan:  #. 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  -Plan for dual chamber pacemaker implant and loop recorder explant today. Explained risks, benefits, and alternatives to pacemaker implantation and loop removal, including but not limited to bleeding, infection, damage to heart or lungs, heart attack, stroke, or death.  Pt verbalized understanding and agrees to  proceed.   #. Paroxysmal atrial fibrillation: S/p multiple ablations and failed AADs.  #. Secondary hypercoagulable state due to atrial fibrillation: Prior stroke on Xarelto.  - Eliquis currently held. Will tentatively plan to resume in 48 hours.    [name], MD 03/09/2023 3:26 PM

## 2023-03-09 NOTE — Plan of Care (Signed)

## 2023-03-09 NOTE — Progress Notes (Signed)
 [facility]                                                                              Shannon [name], is a 73 y.o. female, DOB - 04-12-1950, ZOX:[id number] Admit date - 03/06/2023    Outpatient Primary MD for the patient is Toney Reil, Genene Churn, NP  LOS - 2  days  Chief Complaint  Patient presents with   Chest Pain       Brief summary  Patient is a 73 year old female with paroxysmal A-fib, tachycardia-bradycardia syndrome, hypertension, OSA, drug-induced lupus, CVA, generalized anxiety disorder presented with chest discomfort and palpitations.  In ED, patient ported that she has not been taking Coreg as she develops bradycardia and  "completely stopping of her heart" with Coreg and her cardiologist discontinued it.  She had pacemaker placement scheduled on 03/19/2023.  Also reported left-sided lower chest discomfort, no shortness of breath or diaphoresis. In ED, heart rate 135-150 EKG showed A-fib with RVR, heart rate 152.  Patient was placed on Cardizem drip. Cardiology consulted.  Plan for pacemaker placement  Assessment & Plan  Paroxysmal atrial fibrillation complicated by tachybradycardia syndrome: Had ablation x 3.  Failed dofetilide and amiodarone, had significant pauses postconversion  -Plan for PPM at some point -On Cardizem drip per cardiology.  Rate controlled. -Eliquis held for PPM. -Optimize electrolytes.  Elevated troponin due to demand ischemia in the setting of the above.  No chest pain.  Troponin [phone]  -Defer to cardiology  Essential hypertension -On Cardizem drip.   History of OSA: On oxygen at bedtime. -Continue O2 at bedtime.   History of CVA -Continue Eliquis and simvastatin.   Generalized anxiety disorder -Continue BuSpar.   Reactive airway disease -Continue Xopenex as needed  Anxiety: Stable. -Continue home medications.  Obesity class II Estimated body mass index is 31.53 kg/m as calculated from the following:   Height as of  this encounter: 5\' 6"  (1.676 m).   Weight as of this encounter: 88.6 kg.  Code Status: Full code DVT Prophylaxis:  SCDs Start: 03/07/23 0310 Place TED hose Start: 03/07/23 0310   Level of Care:  Progressive Family Communication: None at bedside. Disposition Plan:      Remains inpatient appropriate: Tachybradycardia syndrome.  Plan for pacemaker placement either today or tomorrow   Procedures:    Consultants:   Cardiology  Antimicrobials:   Anti-infectives (From admission, onward)    Start     Dose/Rate Route Frequency Ordered Stop   03/09/23 1300  gentamicin (GARAMYCIN) 80 mg in sodium chloride 0.9 % 500 mL irrigation        80 mg Irrigation On call 03/09/23 1209 03/10/23 1300   03/09/23 1300  ceFAZolin (ANCEF) IVPB 2g/100 mL premix        2 g 200 mL/hr over 30 Minutes Intravenous On call 03/09/23 1209 03/10/23 1300          Medications  busPIRone  5 mg Oral BID   chlorhexidine  60 mL Topical Once   chlorhexidine  60 mL Topical Once   gentamicin (GARAMYCIN) 80 mg in sodium chloride 0.9 % 500 mL irrigation  80 mg Irrigation  On Call   simvastatin  20 mg Oral q1800   sodium chloride flush  3 mL Intravenous Q12H   sodium chloride flush  3 mL Intravenous Q12H   sodium chloride flush  3 mL Intravenous Q12H   [START ON 03/10/2023] venlafaxine XR  37.5 mg Oral Q breakfast      Subjective:   Seen and examined earlier this morning.  No major events overnight of this morning.   She is in A-fib but rate controlled.  No complaints.  She was told that her pacemaker might not be placed until tomorrow due to scheduling issue.   Objective:   Vitals:   03/08/23 0900 03/08/23 1252 03/09/23 0800 03/09/23 1113  BP: (!) 149/83 134/81  (!) 143/70  Pulse:  77  68  Resp:  16 20 16   Temp:  98.2 F (36.8 C) 98 F (36.7 C) 98 F (36.7 C)  TempSrc:  Oral Oral Oral  SpO2:   95%   Weight:      Height:        Intake/Output Summary (Last 24 hours) at 03/09/2023 1423 Last data  filed at 03/09/2023 0800 Gross per 24 hour  Intake 139.76 ml  Output --  Net 139.76 ml     Wt Readings from Last 3 Encounters:  03/07/23 88.6 kg  02/27/23 84.5 kg  02/04/23 89.4 kg     Exam GENERAL: No apparent distress.  Nontoxic. HEENT: MMM.  Vision and hearing grossly intact.  NECK: Supple.  No apparent JVD.  RESP:  No IWOB.  Fair aeration bilaterally. CVS: Irregular rhythm.  Normal rate.  Heart sounds normal.  ABD/GI/GU: BS+. Abd soft, NTND.  MSK/EXT:   No apparent deformity. Moves extremities. No edema.  SKIN: no apparent skin lesion or wound NEURO: Awake and alert. Oriented appropriately.  No apparent focal neuro deficit. PSYCH: Calm. Normal affect.     Data Reviewed:  I have personally reviewed following labs    CBC Lab Results  Component Value Date   WBC 8.7 03/09/2023   RBC 4.06 03/09/2023   HGB 11.9 (L) 03/09/2023   HCT 37.1 03/09/2023   MCV 91.4 03/09/2023   MCH 29.3 03/09/2023   PLT 286 03/09/2023   MCHC 32.1 03/09/2023   RDW 14.1 03/09/2023   LYMPHSABS 1.5 02/27/2023   MONOABS 0.8 02/03/2023   EOSABS 0.3 02/27/2023   BASOSABS 0.1 02/27/2023     Last metabolic panel Lab Results  Component Value Date   NA 138 03/09/2023   K 4.0 03/09/2023   CL 103 03/09/2023   CO2 26 03/09/2023   BUN 19 03/09/2023   CREATININE 0.86 03/09/2023   GLUCOSE 91 03/09/2023   GFRNONAA >60 03/09/2023   GFRAA 70 01/16/2020   CALCIUM 9.2 03/09/2023   PHOS 3.9 03/09/2023   PROT 7.5 03/07/2023   ALBUMIN 3.2 (L) 03/09/2023   BILITOT 0.5 03/07/2023   ALKPHOS 79 03/07/2023   AST 22 03/07/2023   ALT 21 03/07/2023   ANIONGAP 9 03/09/2023    CBG (last 3)  No results for input(s): "GLUCAP" in the last 72 hours.    Coagulation Profile: No results for input(s): "INR", "PROTIME" in the last 168 hours.   Radiology Studies: I have personally reviewed the imaging studies  No results found.  35 minutes with more than 50% spent in reviewing records, counseling  patient/family and coordinating care.   Almon Hercules M.D. [facility] 03/09/2023, 2:23 PM  Available via Epic secure chat 7am-7pm After 7 pm, please  refer to night coverage provider listed on amion.

## 2023-03-10 ENCOUNTER — Other Ambulatory Visit (HOSPITAL_COMMUNITY): Payer: Self-pay

## 2023-03-10 ENCOUNTER — Inpatient Hospital Stay (HOSPITAL_COMMUNITY)

## 2023-03-10 DIAGNOSIS — I639 Cerebral infarction, unspecified: Secondary | ICD-10-CM | POA: Diagnosis not present

## 2023-03-10 DIAGNOSIS — I4891 Unspecified atrial fibrillation: Secondary | ICD-10-CM | POA: Diagnosis not present

## 2023-03-10 DIAGNOSIS — F411 Generalized anxiety disorder: Secondary | ICD-10-CM | POA: Diagnosis not present

## 2023-03-10 DIAGNOSIS — I2489 Other forms of acute ischemic heart disease: Secondary | ICD-10-CM | POA: Diagnosis not present

## 2023-03-10 LAB — RENAL FUNCTION PANEL
Albumin: 3.2 g/dL — ABNORMAL LOW (ref 3.5–5.0)
Anion gap: 6 (ref 5–15)
BUN: 12 mg/dL (ref 8–23)
CO2: 24 mmol/L (ref 22–32)
Calcium: 8.9 mg/dL (ref 8.9–10.3)
Chloride: 107 mmol/L (ref 98–111)
Creatinine, Ser: 0.64 mg/dL (ref 0.44–1.00)
GFR, Estimated: >60 mL/min (ref >60)
Glucose, Bld: 90 mg/dL (ref 70–99)
Phosphorus: 3.8 mg/dL (ref 2.5–4.6)
Potassium: 4.1 mmol/L (ref 3.5–5.1)
Sodium: 137 mmol/L (ref 135–145)

## 2023-03-10 LAB — MAGNESIUM: Magnesium: 2.2 mg/dL (ref 1.7–2.4)

## 2023-03-10 LAB — CBC
HCT: 36.4 % (ref 36.0–46.0)
Hemoglobin: 11.7 g/dL — ABNORMAL LOW (ref 12.0–15.0)
MCH: 29.1 pg (ref 26.0–34.0)
MCHC: 32.1 g/dL (ref 30.0–36.0)
MCV: 90.5 fL (ref 80.0–100.0)
Platelets: 288 10*3/uL (ref 150–400)
RBC: 4.02 MIL/uL (ref 3.87–5.11)
RDW: 14.2 % (ref 11.5–15.5)
WBC: 8 10*3/uL (ref 4.0–10.5)
nRBC: 0 % (ref 0.0–0.2)

## 2023-03-10 MED ORDER — TRAMADOL HCL 50 MG PO TABS
50.0000 mg | ORAL_TABLET | Freq: Once | ORAL | Status: DC
Start: 2023-03-10 — End: 2023-03-10

## 2023-03-10 MED ORDER — CARVEDILOL 6.25 MG PO TABS
18.7500 mg | ORAL_TABLET | Freq: Two times a day (BID) | ORAL | 3 refills | Status: DC
  Filled 2023-03-10: qty 180, 30d supply, fill #0

## 2023-03-10 MED ORDER — CARVEDILOL 6.25 MG PO TABS: 18.7500 mg | ORAL_TABLET | Freq: Two times a day (BID) | ORAL | Status: DC

## 2023-03-10 MED ORDER — AMLODIPINE BESYLATE 5 MG PO TABS
5.0000 mg | ORAL_TABLET | Freq: Every day | ORAL | 3 refills | Status: DC
  Filled 2023-03-10: qty 90, 90d supply, fill #0

## 2023-03-10 MED ORDER — DILTIAZEM HCL ER COATED BEADS 180 MG PO CP24
180.0000 mg | ORAL_CAPSULE | Freq: Every day | ORAL | Status: DC
  Administered 2023-03-10: 180 mg via ORAL
  Filled 2023-03-10: qty 1

## 2023-03-10 MED ORDER — TRAMADOL HCL 50 MG PO TABS
25.0000 mg | ORAL_TABLET | Freq: Once | ORAL | Status: AC
  Administered 2023-03-10: 25 mg via ORAL
  Filled 2023-03-10: qty 1

## 2023-03-10 MED ORDER — APIXABAN 5 MG PO TABS
5.0000 mg | ORAL_TABLET | Freq: Two times a day (BID) | ORAL | Status: AC
Start: 2023-03-11 — End: ?

## 2023-03-10 NOTE — Plan of Care (Signed)
  Problem: Education: Goal: Knowledge of General Education information will improve Description: Including pain rating scale, medication(s)/side effects and non-pharmacologic comfort measures Outcome: Adequate for Discharge   Problem: Health Behavior/Discharge Planning: Goal: Ability to manage health-related needs will improve Outcome: Adequate for Discharge   Problem: Clinical Measurements: Goal: Ability to maintain clinical measurements within normal limits will improve Outcome: Adequate for Discharge Goal: Will remain free from infection Outcome: Adequate for Discharge Goal: Diagnostic test results will improve Outcome: Adequate for Discharge Goal: Respiratory complications will improve Outcome: Adequate for Discharge Goal: Cardiovascular complication will be avoided Outcome: Adequate for Discharge   Problem: Activity: Goal: Risk for activity intolerance will decrease Outcome: Adequate for Discharge   Problem: Nutrition: Goal: Adequate nutrition will be maintained Outcome: Adequate for Discharge   Problem: Coping: Goal: Level of anxiety will decrease Outcome: Adequate for Discharge   Problem: Elimination: Goal: Will not experience complications related to bowel motility Outcome: Adequate for Discharge Goal: Will not experience complications related to urinary retention Outcome: Adequate for Discharge   Problem: Pain Managment: Goal: General experience of comfort will improve and/or be controlled Outcome: Adequate for Discharge   Problem: Safety: Goal: Ability to remain free from injury will improve Outcome: Adequate for Discharge   Problem: Skin Integrity: Goal: Risk for impaired skin integrity will decrease Outcome: Adequate for Discharge   Problem: Education: Goal: Knowledge of cardiac device and self-care will improve Outcome: Adequate for Discharge Goal: Ability to safely manage health related needs after discharge will improve Outcome: Adequate for  Discharge Goal: Individualized Educational Video(s) Outcome: Adequate for Discharge   Problem: Cardiac: Goal: Ability to achieve and maintain adequate cardiopulmonary perfusion will improve Outcome: Adequate for Discharge

## 2023-03-10 NOTE — Progress Notes (Signed)
 Mobility Specialist Progress Note;   03/10/23 0942  Mobility  Activity Ambulated with assistance in hallway  Level of Assistance Contact guard assist, steadying assist  Assistive Device None  Distance Ambulated (ft) 400 ft  LUE Weight Bearing Per Provider Order NWB  Activity Response Tolerated well  Mobility Referral Yes  Mobility visit 1 Mobility  Mobility Specialist Start Time (ACUTE ONLY) [id number]  Mobility Specialist Stop Time (ACUTE ONLY) 0956  Mobility Specialist Time Calculation (min) (ACUTE ONLY) 14 min   Pt eager for mobility. Required MinG assistance during ambulation as pt states she can be a bit unsteady on her feet. VSS throughout and no c/o when asked. Pt returned back to bed with all needs met, eager for d/c.   Caesar Bookman Mobility Specialist Please contact via SecureChat or [employer] [phone]

## 2023-03-10 NOTE — Discharge Summary (Signed)
 Physician Discharge Summary   Patient: Shannon [name] MRN: [id number] DOB: [birth year]  Admit date:     03/06/2023  Discharge date: 03/10/23  Discharge Physician: Thad Ranger, MD    PCP: Eden Emms, NP   Recommendations at discharge:   Pacemaker placed on 03/09/23 Patient recommended to be eliquis on evening of 03/11/2023  Discharge Diagnoses:  Paroxysmal atrial fibrillation with RVR (HCC) Tachybradycardia syndrome   Primary hypertension   OSA (obstructive sleep apnea)   History of CVA (cerebrovascular accident)   GAD (generalized anxiety disorder)   Demand ischemia [facility])    Hospital Course:  Patient is a 73 year old female with paroxysmal A-fib, tachycardia-bradycardia syndrome, hypertension, OSA, drug-induced lupus, CVA, generalized anxiety disorder presented with chest discomfort and palpitations.  In ED, patient ported that she has not been taking Coreg as she develops bradycardia and  "completely stopping of her heart" with Coreg and her cardiologist discontinued it.  She had pacemaker placement scheduled on 03/19/2023.  Also reported left-sided lower chest discomfort, no shortness of breath or diaphoresis. In ED, heart rate 135-150 EKG showed A-fib with RVR, heart rate 152.  Patient was placed on Cardizem drip. Cardiology was consulted.   Assessment and Plan:   Paroxysmal atrial fibrillation complicated by tachybradycardia syndrome:  - Had ablation x 3.  Failed dofetilide and amiodarone, had significant pauses postconversion  -Patient had plan for outpatient pacemaker placement on 3/15 however she got admitted with atrial fibrillation with RVR -EP cardiology was consulted, pacemaker placed on 03/09/2023  -Doing well, no acute complaints, cleared by EP cardiology to discharge home. -Recommended to continue amlodipine 5 mg daily, Coreg 18.75 mg twice daily.  Will resume eliquis on 03/11/2023 evening     Elevated troponin due to demand ischemia in the setting of the  above.  No chest pain.  Troponin [phone]  -Likely due to #1   Essential hypertension -BP stable, continue Coreg 18.75 mg twice daily, Norvasc 5 mg daily   History of OSA: On oxygen at bedtime. -Continue O2 at bedtime.   History of CVA -Continue Eliquis and simvastatin.   Generalized anxiety disorder -Continue BuSpar.   Reactive airway disease -Stable, continue albuterol inhaler as needed   Anxiety: Stable. -Continue home medications.   Obesity class II Estimated body mass index is 31.53 kg/m as calculated from the following:   Height as of this encounter: 5\' 6"  (1.676 m).   Weight as of this encounter: 88.6 kg       Pain control - [place] Controlled Substance Reporting System database was reviewed. and patient was instructed, not to drive, operate heavy machinery, perform activities at heights, swimming or participation in water activities or provide baby-sitting services while on Pain, Sleep and Anxiety Medications; until their outpatient Physician has advised to do so again. Also recommended to not to take more than prescribed Pain, Sleep and Anxiety Medications.  Consultants: EP cardiology Procedures performed: Pacemaker placement on 3/3 Disposition: Home Diet recommendation:  Discharge Diet Orders (From admission, onward)     Start     Ordered   03/10/23 0000  Diet - low sodium heart healthy        03/10/23 1127            DISCHARGE MEDICATION: Allergies as of 03/10/2023       Reactions   Sulfur Nausea And Vomiting   Ace Inhibitors Cough   Amiodarone Other (See Comments)   Intolerance multiple side effects   Hctz [hydrochlorothiazide] Other (See Comments)  Caused drug-induced LUPUS   Oxycodone Other (See Comments)   Hallucinations   Prednisone Other (See Comments)   Made patient very aggressive   Sulfa Antibiotics Nausea And Vomiting   Voltaren [diclofenac Sodium] Other (See Comments)   Made patient become aggressive        Medication  List     TAKE these medications    acetaminophen 500 MG tablet Commonly known as: TYLENOL Take 1,000 mg by mouth every 6 (six) hours as needed for moderate pain or headache.   albuterol 108 (90 Base) MCG/ACT inhaler Commonly known as: VENTOLIN HFA Inhale 1-2 puffs into the lungs every 6 (six) hours as needed for wheezing or shortness of breath.   amLODipine 5 MG tablet Commonly known as: NORVASC Take 1 tablet (5 mg total) by mouth daily. What changed: when to take this   apixaban 5 MG Tabs tablet Commonly known as: Eliquis Take 1 tablet (5 mg total) by mouth 2 (two) times daily. Resume tomorrow 03/11/23 evening Start taking on: March 11, 2023 What changed: additional instructions   busPIRone 5 MG tablet Commonly known as: BUSPAR TAKE 1 TABLET BY MOUTH TWICE  DAILY   carvedilol 6.25 MG tablet Commonly known as: COREG Take 3 tablets (18.75 mg total) by mouth 2 (two) times daily with a meal.   CORTIZONE-10 EX Apply 1 application  topically as needed (skin irritation/itching).   furosemide 20 MG tablet Commonly known as: LASIX TAKE 1 TABLET BY MOUTH DAILY   levalbuterol 45 MCG/ACT inhaler Commonly known as: Xopenex HFA Inhale 2 puffs into the lungs every 6 (six) hours as needed for wheezing or shortness of breath.   loratadine 10 MG tablet Commonly known as: CLARITIN Take 10 mg by mouth daily as needed for allergies.   multivitamin tablet Take 1 tablet by mouth daily.   Potassium Chloride ER 20 MEQ Tbcr Take 1 tablet (20 mEq total) by mouth daily. What changed: how much to take   simvastatin 20 MG tablet Commonly known as: ZOCOR TAKE 1 TABLET BY MOUTH DAILY   Vitamin D 50 MCG (2000 UT) Caps Take 2,000 Units by mouth in the morning.        Follow-up Information     Eden Emms, NP. Schedule an appointment as soon as possible for a visit in 2 week(s).   Specialties: Nurse Practitioner, Family Medicine Why: for hospital follow-up Contact information: [address] [place] [address] [phone]                Discharge Exam: Ceasar Mons Weights   03/06/23 2340 03/07/23 0605  Weight: 84.5 kg 88.6 kg   S: No acute complaints, cleared by EP cardiology to discharge home.   BP (!) 143/77 (BP Location: Right Arm)   Pulse 70   Temp 99 F (37.2 C) (Oral)   Resp 20   Ht 5\' 6"  (1.676 m)   Wt 88.6 kg   SpO2 99%   BMI 31.53 kg/m   Physical Exam General: Alert and oriented x 3, NAD Cardiovascular: S1 S2 clear, RRR.  Respiratory: CTAB, no wheezing, rales or rhonchi Gastrointestinal: Soft, nontender, nondistended, NBS Ext: no pedal edema bilaterally, left arm in sling Neuro: no new deficits Psych: Normal affect    Condition at discharge: fair  The results of significant diagnostics from this hospitalization (including imaging, microbiology, ancillary and laboratory) are listed below for reference.   Imaging Studies: DG Chest 2 View Result Date: 03/10/2023 CLINICAL DATA:  [id number]. Cardiac  device in Situ pacemaker implant of yesterday. EXAM: CHEST - 2 VIEW COMPARISON:  Pre-procedure portable chest 03/07/2022 FINDINGS: There is new demonstration of a left chest dual lead pacing system. One of the wires terminates in the plane of the right atrium and the other in the right ventricle. There are no measurable pneumothorax. There is mild cardiomegaly but no vascular congestion is seen. No substantial pleural effusion. The mediastinum is stable with mild aortic tortuosity and calcification. There is degenerative change of the spine and osteopenia. IMPRESSION: 1. New demonstration of a left chest dual lead pacing system. No measurable pneumothorax. 2. Mild cardiomegaly. 3. Aortic atherosclerosis. Electronically Signed   By: Almira Bar M.D.   On: 03/10/2023 07:30   EP PPM/ICD IMPLANT Result Date: 03/09/2023  CONCLUSIONS:  1. Successful dual chamber pacemaker implant with LBBAP lead.  2. Successful loop recorder explant.  3. No early  apparent complications. [name], MD Cardiac Electrophysiology   DG Chest Port 1 View Result Date: 03/07/2023 CLINICAL DATA:  Chest pain EXAM: PORTABLE CHEST 1 VIEW COMPARISON:  02/03/2023 FINDINGS: Cardiac shadow is prominent. Loop recorder is again seen. Aortic calcifications are noted. Lungs are well aerated bilaterally. No focal infiltrate or effusion is seen. No bony abnormality is noted. IMPRESSION: No active disease. Electronically Signed   By: Alcide Clever M.D.   On: 03/07/2023 01:12    Microbiology: Results for orders placed or performed during the hospital encounter of 03/06/23  Surgical PCR screen     Status: None   Collection Time: 03/09/23 12:10 PM   Specimen: Nasal Mucosa; Nasal Swab  Result Value Ref Range Status   MRSA, PCR NEGATIVE NEGATIVE Final   Staphylococcus aureus NEGATIVE NEGATIVE Final    Comment: (NOTE) The Xpert SA Assay (FDA approved for NASAL specimens in patients 20 years of age and older), is one component of a comprehensive surveillance program. It is not intended to diagnose infection nor to guide or monitor treatment. Performed at [facility], [address], [place], [place] [address]     Labs: CBC: Recent Labs  Lab 03/06/23 2350 03/07/23 0535 03/09/23 0440 03/10/23 0640  WBC 8.3 9.6 8.7 8.0  HGB 13.0 13.3 11.9* 11.7*  HCT 40.5 41.3 37.1 36.4  MCV 90.0 91.4 91.4 90.5  PLT 299 318 286 288   Basic Metabolic Panel: Recent Labs  Lab 03/06/23 2350 03/07/23 0535 03/09/23 0440 03/10/23 0640  NA 139 140 138 137  K 3.6 3.6 4.0 4.1  CL 102 102 103 107  CO2 24 26 26 24   GLUCOSE 137* 124* 91 90  BUN 15 11 19 12   CREATININE 0.65 0.61 0.86 0.64  CALCIUM 9.7 9.2 9.2 8.9  MG  --   --   --  2.2  PHOS  --   --  3.9 3.8   Liver Function Tests: Recent Labs  Lab 03/07/23 0535 03/09/23 0440 03/10/23 0640  AST 22  --   --   ALT 21  --   --   ALKPHOS 79  --   --   BILITOT 0.5  --   --   PROT 7.5  --   --   ALBUMIN 3.9 3.2* 3.2*    CBG: No results for input(s): "GLUCAP" in the last 168 hours.  Discharge time spent: greater than 30 minutes.  Signed: Thad Ranger, MD [facility] 03/10/2023

## 2023-03-10 NOTE — Progress Notes (Cosign Needed)
 Rounding Note    Patient Name: Shannon [name] Date of Encounter: 03/10/2023  [facility] Cardiologist: Dr. Lalla Brothers  Subjective   Feels well, mild ache at implant site  Inpatient Medications    Scheduled Meds:  busPIRone  5 mg Oral BID   diltiazem  180 mg Oral Daily   simvastatin  20 mg Oral q1800   sodium chloride flush  3 mL Intravenous Q12H   sodium chloride flush  3 mL Intravenous Q12H   sodium chloride flush  3 mL Intravenous Q12H   venlafaxine XR  37.5 mg Oral Q breakfast   Continuous Infusions:   PRN Meds: acetaminophen **OR** acetaminophen, bisacodyl, HYDROmorphone (DILAUDID) injection, levalbuterol, LORazepam, ondansetron **OR** ondansetron (ZOFRAN) IV, sodium chloride flush   Vital Signs    Vitals:   03/09/23 2003 03/10/23 0554 03/10/23 0801 03/10/23 0943  BP: (!) 142/61 (!) 166/83 (!) 161/77 (!) 161/77  Pulse: 79 72 70   Resp: 20 16 16    Temp: 98 F (36.7 C) 98.3 F (36.8 C) 98.8 F (37.1 C)   TempSrc: Oral Oral Oral   SpO2: 99% 100% 97%   Weight:      Height:        Intake/Output Summary (Last 24 hours) at 03/10/2023 1030 Last data filed at 03/10/2023 0000 Gross per 24 hour  Intake 300 ml  Output --  Net 300 ml      03/07/2023    6:05 AM 03/06/2023   11:40 PM 02/27/2023    9:40 AM  Last 3 Weights  Weight (lbs) 195 lb 5.2 oz 186 lb 4.6 oz 186 lb 3.2 oz  Weight (kg) 88.6 kg 84.5 kg 84.46 kg      Telemetry    SR 70's. Rare A pacing - Personally Reviewed  ECG    SR 72bpm  - Personally Reviewed  Physical Exam   GEN: No acute distress.   Neck: No JVD Cardiac: RRR, no murmurs, rubs, or gallops.  Respiratory: CTA b/l. GI: Soft, nontender, non-distended  MS: No edema; No deformity. Neuro:  Nonfocal  Psych: Normal affect   PPM and loop removal sites are stable, no bleeding, drainage, hematoma or ecchymosis  Labs    High Sensitivity Troponin:   Recent Labs  Lab 03/06/23 2350 03/07/23 0243 03/07/23 0535  03/07/23 0935  TROPONINIHS 46* 59* 56* 62*     Chemistry Recent Labs  Lab 03/07/23 0535 03/09/23 0440 03/10/23 0640  NA 140 138 137  K 3.6 4.0 4.1  CL 102 103 107  CO2 26 26 24   GLUCOSE 124* 91 90  BUN 11 19 12   CREATININE 0.61 0.86 0.64  CALCIUM 9.2 9.2 8.9  MG  --   --  2.2  PROT 7.5  --   --   ALBUMIN 3.9 3.2* 3.2*  AST 22  --   --   ALT 21  --   --   ALKPHOS 79  --   --   BILITOT 0.5  --   --   GFRNONAA >60 >60 >60  ANIONGAP 12 9 6     Lipids No results for input(s): "CHOL", "TRIG", "HDL", "LABVLDL", "LDLCALC", "CHOLHDL" in the last 168 hours.  Hematology Recent Labs  Lab 03/07/23 0535 03/09/23 0440 03/10/23 0640  WBC 9.6 8.7 8.0  RBC 4.52 4.06 4.02  HGB 13.3 11.9* 11.7*  HCT 41.3 37.1 36.4  MCV 91.4 91.4 90.5  MCH 29.4 29.3 29.1  MCHC 32.2 32.1 32.1  RDW 14.2 14.1 14.2  PLT 318 286 288   Thyroid  Recent Labs  Lab 03/07/23 0535  TSH 1.156    BNPNo results for input(s): "BNP", "PROBNP" in the last 168 hours.  DDimer No results for input(s): "DDIMER" in the last 168 hours.   Radiology    DG Chest 2 View Result Date: 03/10/2023 CLINICAL DATA:  [id number]. Cardiac device in Situ pacemaker implant of yesterday. EXAM: CHEST - 2 VIEW COMPARISON:  Pre-procedure portable chest 03/07/2022 FINDINGS: There is new demonstration of a left chest dual lead pacing system. One of the wires terminates in the plane of the right atrium and the other in the right ventricle. There are no measurable pneumothorax. There is mild cardiomegaly but no vascular congestion is seen. No substantial pleural effusion. The mediastinum is stable with mild aortic tortuosity and calcification. There is degenerative change of the spine and osteopenia. IMPRESSION: 1. New demonstration of a left chest dual lead pacing system. No measurable pneumothorax. 2. Mild cardiomegaly. 3. Aortic atherosclerosis. Electronically Signed   By: Almira Bar M.D.   On: 03/10/2023 07:30   EP PPM/ICD IMPLANT Result  Date: 03/09/2023  CONCLUSIONS:  1. Successful dual chamber pacemaker implant with LBBAP lead.  2. Successful loop recorder explant.  3. No early apparent complications. [name], MD Cardiac Electrophysiology    Cardiac Studies   10/23/22: TTE 1. Left ventricular ejection fraction, by estimation, is 60 to 65%. The  left ventricle has normal function. The left ventricle has no regional  wall motion abnormalities. There is mild concentric left ventricular  hypertrophy. Left ventricular diastolic  parameters are indeterminate.   2. Right ventricular systolic function is normal. The right ventricular  size is normal.   3. Left atrial size was moderately dilated.   4. Right atrial size was moderately dilated.   5. The mitral valve is normal in structure. Mild to moderate mitral valve  regurgitation. No evidence of mitral stenosis.   6. The aortic valve is tricuspid. There is mild calcification of the  aortic valve. Aortic valve regurgitation is not visualized. Aortic valve  sclerosis/calcification is present, without any evidence of aortic  stenosis.   7. The inferior vena cava is normal in size with greater than 50%  respiratory variability, suggesting right atrial pressure of 3 mmHg.   Patient Profile     73 y.o. 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 Lalla Brothers 02/27/23, loop recently revealed post conversion pauses (as long as 5 seconds), initially asymptomatic though recently with reports of near syncope, felt awful, planned for PPM for tachy-brady (03/19/23) Her home coreg stopped  ADMITTED now over the weekend with symptomatic AFib, feels weak Started on dilt gtt and had spontaneous CV to sinus (without pause)   Assessment & Plan    Tachy-brady S/p PPM implant 3/3 with Dr. Jimmey Ralph Site is stable this morning Device check with stable measurements this morning CXR this  morning without PTX Wound care and restrictions reviewed with the patient EP F/u is in place  OK to resume her prior coreg 18.75mg  BID   Paroxysmal Afib Off Eliquis here Hx of stroke >>> will minimize off OAC as brief as able OK TO RESUME HER ELIQUIS 03/11/23 EVENING DOSE     OK for discharge from EP perspective Dr. Jimmey Ralph has seen the patient    For questions or updates, please contact [facility] Please consult [email or web address] for contact info under  Signed, Sheilah Pigeon, PA-C  03/10/2023, 10:30 AM    I have seen, examined the patient, and reviewed the above assessment and plan.    Interval: Dual chamber pacer implant yesterday.   GEN: No acute distress.   Cardiac: Normal rate, regular rhythm. Left chest pacer pocket without bleeding or hematoma. Psych: Normal affect   Assessment: Ms. Damron is a 73 y.o. 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 She was planned for outpatient pacemaker implant for tachycardia-bradycardia syndrome in the setting of 5 second post conversion pauses.    S/p dual chamber pacemaker implant with LBBAP lead on 03/09/23.  Plan:  #. S/p pacemaker  #. 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  -CXR with appropriate lead position.  -Device check performed today with appropriate function and stable lead parameters.  -Usual activity restrictions and wound care teaching provided.    #. Paroxysmal atrial fibrillation: S/p multiple ablations and failed AADs.  #. Secondary hypercoagulable state due to atrial fibrillation: Prior stroke on Xarelto.  -Currently in sinus rhythm. Resume Eliquis 48 hours after implant.    [name], MD 03/11/2023 8:13 AM

## 2023-03-10 NOTE — Care Management Important Message (Signed)
 Important Message  Patient Details  Name: Shannon [name] MRN: [id number] Date of Birth: 1950/07/16   Important Message Given:  Yes - Medicare IM     [name] 03/10/2023, 1:07 PM

## 2023-03-10 NOTE — Progress Notes (Signed)
 On patient's EKG this am QTC 589, repeat EKG QTC 332.  Pt currently resting without c/o. Cards PA notified of QTC.

## 2023-03-10 NOTE — Progress Notes (Signed)
 Discharge instructions (including medications) discussed with and copy provided to patient/caregiver

## 2023-03-10 NOTE — Discharge Instructions (Signed)
 After Your Pacemaker (and loop removal)   You have a [employer] Pacemaker  ACTIVITY Do not lift your arm above shoulder height for 1 week after your procedure. After 7 days, you may progress as below.  You should remove your sling 24 hours after your procedure, unless otherwise instructed by your provider.     Tuesday March 17, 2023  Wednesday March 18, 2023 Thursday March 19, 2023 Friday March 20, 2023   Do not lift, push, pull, or carry anything over 10 pounds with the affected arm until 6 weeks (Tuesday April 21, 2023 ) after your procedure.   You may drive AFTER your wound check, unless you have been told otherwise by your provider.   Ask your healthcare provider when you can go back to work   INCISION/Dressing If you are on a blood thinner such as Coumadin, Xarelto, Eliquis, Plavix, or Pradaxa please confirm with your provider when this should be resumed. Resume Eliquis on 03/11/23 evening dose  If large square, outer bandage is left in place, this can be removed after 24 hours from your procedure. Do not remove steri-strips or glue as below.   If a PRESSURE DRESSING (a bulky dressing that usually goes up over your shoulder) was applied or left in place, please follow instructions given by your provider on when to return to have this removed.   Monitor your Pacemaker site for redness, swelling, and drainage. Call the device clinic at [phone] if you experience these symptoms or fever/chills.  If your incision is sealed with Steri-strips or staples, you may shower 7 days after your procedure or when told by your provider. Do not remove the steri-strips or let the shower hit directly on your site. You may wash around your site with soap and water.    If you were discharged in a sling, please do not wear this during the day more than 48 hours after your surgery unless otherwise instructed. This may increase the risk of stiffness and soreness in your shoulder.   Avoid lotions,  ointments, or perfumes over your incision until it is well-healed.  You may use a hot tub or a pool AFTER your wound check appointment if the incision is completely closed.  Pacemaker Alerts:  Some alerts are vibratory and others beep. These are NOT emergencies. Please call our office to let us know. If this occurs at night or on weekends, it can wait until the next business day. Send a remote transmission.  If your device is capable of reading fluid status (for heart failure), you will be offered monthly monitoring to review this with you.   DEVICE MANAGEMENT Remote monitoring is used to monitor your pacemaker from home. This monitoring is scheduled every 91 days by our office. It allows [place] to keep an eye on the functioning of your device to ensure it is working properly. You will routinely see your Electrophysiologist annually (more often if necessary).   You should receive your ID card for your new device in 4-8 weeks. Keep this card with you at all times once received. Consider wearing a medical alert bracelet or necklace.  Your Pacemaker may be MRI compatible. This will be discussed at your next office visit/wound check.  You should avoid contact with strong electric or magnetic fields.   Do not use amateur (ham) radio equipment or electric (arc) welding torches. MP3 player headphones with magnets should not be used. Some devices are safe to use if held at least 12 inches (30 cm)  from your Pacemaker. These include power tools, lawn mowers, and speakers. If you are unsure if something is safe to use, ask your health care provider.  When using your cell phone, hold it to the ear that is on the opposite side from the Pacemaker. Do not leave your cell phone in a pocket over the Pacemaker.  You may safely use electric blankets, heating pads, computers, and microwave ovens.  Call the office right away if: You have chest pain. You feel more short of breath than you have felt before. You feel  more light-headed than you have felt before. Your incision starts to open up.  This information is not intended to replace advice given to you by your health care provider. Make sure you discuss any questions you have with your health care provider.

## 2023-03-11 ENCOUNTER — Encounter: Payer: Self-pay | Admitting: Cardiology

## 2023-03-11 ENCOUNTER — Telehealth: Payer: Self-pay

## 2023-03-11 LAB — CUP PACEART REMOTE DEVICE CHECK
Date Time Interrogation Session: 20250302230255
Implantable Pulse Generator Implant Date: 20210301

## 2023-03-11 NOTE — Transitions of Care (Post Inpatient/ED Visit) (Signed)
 03/11/2023  Name: Shannon [name] MRN: [id number] DOB: 1950/07/29  Today's TOC FU Call Status: Today's TOC FU Call Status:: Successful TOC FU Call Completed TOC FU Call Complete Date: 03/11/23 Patient's Name and Date of Birth confirmed.  Transition Care Management Follow-up Telephone Call Date of Discharge: 03/10/23 Discharge Facility: [facility]) Type of Discharge: Inpatient Admission Primary Inpatient Discharge Diagnosis:: Pacemaker Placement How have you been since you were released from the hospital?: Better (Having some pain at the incision site) Any questions or concerns?: Yes Patient Questions/Concerns:: I reviewed my labs and there are a few abnormal ones Patient Questions/Concerns Addressed: Notified Provider of Patient Questions/Concerns, Other: (Has a PCP appointment 03/18/23)  Items Reviewed: Did you receive and understand the discharge instructions provided?: Yes Medications obtained,verified, and reconciled?: Yes (Medications Reviewed) Any new allergies since your discharge?: No Dietary orders reviewed?: Yes Type of Diet Ordered:: Low sodium Heart Helathy Do you have support at home?: Yes People in Home: child(ren), adult Name of Support/Comfort Primary Source: Fernanda Twaddell  Medications Reviewed Today: Medications Reviewed Today     Reviewed by [facility], RN (Case Manager) on 03/11/23 at 1342  Med List Status: <None>   Medication Order Taking? Sig Documenting Provider Last Dose Status Informant  acetaminophen (TYLENOL) 500 MG tablet [id number] No Take 1,000 mg by mouth every 6 (six) hours as needed for moderate pain or headache. [provider] 03/04/2023 Active Self  albuterol (VENTOLIN HFA) 108 (90 Base) MCG/ACT inhaler [id number] No Inhale 1-2 puffs into the lungs every 6 (six) hours as needed for wheezing or shortness of breath. Parrett, Virgel Bouquet, NP Past Week Active Self  amLODipine (NORVASC) 5 MG tablet [id number]  Take 1 tablet (5 mg  total) by mouth daily. Rai, Delene Ruffini, MD  Active   apixaban (ELIQUIS) 5 MG TABS tablet [id number]  Take 1 tablet (5 mg total) by mouth 2 (two) times daily. Resume tomorrow 03/11/23 evening Rai, Delene Ruffini, MD  Active   busPIRone (BUSPAR) 5 MG tablet [id number] No TAKE 1 TABLET BY MOUTH TWICE  DAILY Eden Emms, NP 03/06/2023 Bedtime Active Self  carvedilol (COREG) 6.25 MG tablet [id number]  Take 3 tablets (18.75 mg total) by mouth 2 (two) times daily with a meal. Rai, Ripudeep K, MD  Active   Cholecalciferol (VITAMIN D) 50 MCG (2000 UT) CAPS [id number] No Take 2,000 Units by mouth in the morning. [provider] 03/06/2023 Morning Active Self  furosemide (LASIX) 20 MG tablet [id number] No TAKE 1 TABLET BY MOUTH DAILY Lanier Prude, MD 03/06/2023 Morning Active Self  Hydrocortisone (CORTIZONE-10 EX) [id number] No Apply 1 application  topically as needed (skin irritation/itching). [provider] Past Week Active Self  levalbuterol (XOPENEX HFA) 45 MCG/ACT inhaler [id number] No Inhale 2 puffs into the lungs every 6 (six) hours as needed for wheezing or shortness of breath. Eden Emms, NP Past Week Active Self  loratadine (CLARITIN) 10 MG tablet [id number] No Take 10 mg by mouth daily as needed for allergies. [provider] Past Month Active Self           Med Note (MCNEIL, KAREN A   Wed Mar 26, 2022  3:01 PM)    Multiple Vitamin (MULTIVITAMIN) tablet [id number] No Take 1 tablet by mouth daily. [provider] 03/06/2023 Morning Active Self  potassium chloride 20 MEQ TBCR [id number] No Take 1 tablet (20 mEq total) by mouth daily.  Patient taking differently: Take 40 mEq by mouth daily.   Sherie Don,  NP 03/06/2023 Morning Active Self  simvastatin (ZOCOR) 20 MG tablet [id number] No TAKE 1 TABLET BY MOUTH DAILY Eden Emms, NP 03/06/2023 Bedtime Active Self            Home Care and Equipment/Supplies: Were Home Health Services Ordered?: NA Any new equipment  or medical supplies ordered?: NA  Functional Questionnaire: Do you need assistance with bathing/showering or dressing?: No Do you need assistance with meal preparation?: No Do you need assistance with eating?: No Do you have difficulty maintaining continence: No Do you need assistance with getting out of bed/getting out of a chair/moving?: No Do you have difficulty managing or taking your medications?: No  Follow up appointments reviewed: PCP Follow-up appointment confirmed?: Yes Date of PCP follow-up appointment?: 03/18/23 Follow-up Provider: [facility] Follow-up appointment confirmed?: Yes Date of Specialist follow-up appointment?: 03/25/23 Follow-Up Specialty Provider:: Wound check up Do you need transportation to your follow-up appointment?: Yes Transportation Need Intervention Addressed By::  (Her daughter can likely take her until she is cleared to drive. Directed her to call her Insurance benefit for transportation assistance) Do you understand care options if your condition(s) worsen?: Yes-patient verbalized understanding  SDOH Interventions Today    Flowsheet Row Most Recent Value  SDOH Interventions   Food Insecurity Interventions Intervention Not Indicated  Housing Interventions Intervention Not Indicated  Transportation Interventions Intervention Not Indicated  Utilities Interventions Intervention Not Indicated      Interventions Today    Flowsheet Row Most Recent Value  Chronic Disease   Chronic disease during today's visit Atrial Fibrillation (AFib)  General Interventions   General Interventions Discussed/Reviewed General Interventions Discussed, General Interventions Reviewed, Doctor Visits  Doctor Visits Discussed/Reviewed Doctor Visits Reviewed  Exercise Interventions   Exercise Discussed/Reviewed Physical Activity  Physical Activity Discussed/Reviewed Physical Activity Reviewed  Education Interventions   Education Provided Provided  Education  Provided Verbal Education On Medication, Exercise, Insurance Plans  Pharmacy Interventions   Pharmacy Dicussed/Reviewed Medications and their functions       The patient has been provided with contact information for the care management team and has been advised to call with any health-related questions or concerns. The patient verbalized understanding with current POC. The patient is directed to their insurance card regarding availability of benefits coverage.  Deidre Ala, BSN, RN [place]  [facility] - [employer] [facility] RN Care Manager [phone]

## 2023-03-12 ENCOUNTER — Other Ambulatory Visit: Payer: Self-pay | Admitting: Nurse Practitioner

## 2023-03-12 DIAGNOSIS — F419 Anxiety disorder, unspecified: Secondary | ICD-10-CM

## 2023-03-13 ENCOUNTER — Ambulatory Visit: Payer: Medicare Other | Admitting: Behavioral Health

## 2023-03-13 ENCOUNTER — Encounter: Payer: Self-pay | Admitting: Behavioral Health

## 2023-03-13 DIAGNOSIS — F4323 Adjustment disorder with mixed anxiety and depressed mood: Secondary | ICD-10-CM

## 2023-03-13 NOTE — Progress Notes (Addendum)
 [facility] Counselor/Therapist Progress Note  Patient ID: Shannon [name], MRN: [id number],    Date: March 7th, 2025  Time Spent: 58 minutes, 3 PM until 3:58 PM this session was held via video teletherapy. The patient consented to the video teletherapy and was located in her home during this session. She is aware it is the responsibility of the patient to secure confidentiality on her end of the session. The provider was in a private home office for the duration of this session.     Reported Symptoms: Anxiety, depression  Mental Status Exam: Appearance:  Well Groomed     Behavior: Appropriate  Motor: Normal  Speech/Language:  Clear and Coherent  Affect: Appropriate for  Mood: normal  Thought process: normal  Thought content:   WNL  Sensory/Perceptual disturbances:   WNL  Orientation: oriented to person, place, time/date, situation, day of week, month of year, and year  Attention: Good  Concentration: Good  Memory: WNL  Fund of knowledge:  Good  Insight:   Good  Judgment:  Good  Impulse Control: Good   Risk Assessment: Danger to Self:  No Self-injurious Behavior: No Danger to Others: No Duty to Warn:no Physical Aggression / Violence:No  Access to Firearms a concern: No  Gang Involvement:No   Subjective: The patient said that some choices her husband make some additional things that she found out caused her to go into A-fib.  She had to go to the hospital where she was for several days.  Although she was already scheduled for a pacemaker they did it earlier this week in advance of what was already scheduled.  The procedure was somewhat uncomfortable but she said she noticed benefits in terms of no A-fib breathing easier etc.  She wished they had done this sooner.  She is chosen to set even firmer boundaries and is reaching out to [employer]. to see what she can do in terms of setting and closing all financial ties to her husband as much as  possible.  I encouraged her to do that at a pace that she felt comfortable doing very mindful of her anxiety and to use coping skills.  She said that even her son and daughter now had enough and are setting very clear boundaries with her husband also. She does contract for safety having no thoughts of hurting herself or anyone else. Interventions: Cognitive Behavioral Therapy  Diagnosis: Adjustment disorder with mixed anxiety and depressed mood.  Plan: I will meet with the patient every 2 weeks via video session [place]. Plan:To use cognitive behavioral therapy principles as well as elements of dialectical behavior therapy.  Goals are to reduce anxiety and depression by at least 50% with a target date of November 06, 2022.  Goals are to have less sadness as indicated by PHQ-9 scores as well as patient report.  We also work on improving mood and return to a healthier level of functioning as defined by her goals for being happy, identify causes and process triggers for depressed mood.  We will use cognitive behavioral therapy to explore and replace unhealthy thoughts and behavior patterns contributing to depression.  I will continue to encourage shearing of feelings related to the causes and symptoms of depression as well as teach and encouraged use of coping skills for management of depressive symptoms.  We also will work to improve the patient's ability to manage anxiety symptoms and better handle stress, identify causes for anxiety and explore ways for reduction of  anxiety in addition to resolving conflicts contributing to anxiety and managing thoughts and worrisome thinking is contributing to anxiety.  Interventions will include providing education about anxiety, facilitate problem solution skills, teaching coping skills for managing anxiety such as grounding exercises, progressive muscle relaxation and cognitive re framing etc.  We will also use cognitive behavioral therapy to identify and change anxiety  provoking thoughts and behavior patterns as well as using DBT distress tolerance and mindfulness skills. I reviewed the treatment plan goals with the patient who agreed to continue with goals as stated above. Progress: 35%New target date will be May 06, 2023. Shannon [name], [facility]                                    Shannon [name], [facility]               Shannon [name], [facility]               Shannon [name], [facility]               Shannon [name], [facility]               Shannon [name], [facility]

## 2023-03-16 ENCOUNTER — Encounter: Payer: Self-pay | Admitting: Cardiology

## 2023-03-16 ENCOUNTER — Ambulatory Visit: Payer: Self-pay | Admitting: Nurse Practitioner

## 2023-03-16 ENCOUNTER — Telehealth: Payer: Self-pay | Admitting: Cardiology

## 2023-03-16 NOTE — Telephone Encounter (Signed)
  Chief Complaint: knot to right arm s/p IV placement Symptoms: tender to the touch. Minimal pain,  Frequency: Since hospitalization Pertinent Negatives: Patient denies fever Disposition: [] ED /[] Urgent Care (no appt availability in office) / [x] Appointment(In office/virtual)/ []  [facility]/ [] Home Care/ [] Refused Recommended Disposition /[] [employer] [facility]/ [x]  Follow-up with PCP Additional Notes: patient was recently hospitalized from 03/06/2023-03/10/2023 and has concerns for her right arm. Patient had an IV placed at the [facility]. Patient states she had swelling to the site in the hospital and warm compresses were placed on site. Patient endorses minimal pain and swelling at this time. Patient does endorse the feeling that something broke off in her arm. States she feels a knot in the location. Patient is scheduled for hospital follow up on 03/18/2023. Patient is questioning if she needs to get an ultrasound done of her arm. Patient is asking for PCP staff to call her back if this is something she needs to get done before her appointment. Patient verbalized understanding and all questions answered.    Copied from CRM [id number]. Topic: Appointments - Appointment Scheduling >> Mar 16, 2023 12:36 PM Alcus Dad wrote: Patient/patient representative is calling to schedule an appointment. Needs an ultrasound on arm. Patient has a pacemaker and arm has developed a knot or mass. Very Tender to touch Reason for Disposition  [1] MODERATE pain (e.g., interferes with normal activities) AND [2] present > 3 days  Answer Assessment - Initial Assessment Questions 1. ONSET: "When did the pain start?"     RAC-pain started last Tuesday-started swelling-was told to put warm compresses on location 2. LOCATION: "Where is the pain located?"     Right arm 3. PAIN: "How bad is the pain?" (Scale 1-10; or mild, moderate, severe)   - MILD (1-3): Doesn't interfere with normal activities.   - MODERATE (4-7):  Interferes with normal activities (e.g., work or school) or awakens from sleep.   - SEVERE (8-10): Excruciating pain, unable to do any normal activities, unable to hold a cup of water.     1 out of 10 4. WORK OR EXERCISE: "Has there been any recent work or exercise that involved this part of the body?"     No 5. CAUSE: "What do you think is causing the arm pain?"     Patient is concerned that something broke off while the IV was in place 6. OTHER SYMPTOMS: "Do you have any other symptoms?" (e.g., neck pain, swelling, rash, fever, numbness, weakness)     Patient concerned that a knot formed in the area  Protocols used: Arm Pain-A-AH

## 2023-03-16 NOTE — Addendum Note (Signed)
 Addended by: Geralyn Flash D on: 03/16/2023 12:51 PM   Modules accepted: Orders

## 2023-03-16 NOTE — Telephone Encounter (Signed)
 Pateint states that after her procedure she feels like something is stuck in her arm. From where she had her needle put in. Please advise.

## 2023-03-16 NOTE — Telephone Encounter (Signed)
 Returned pt call.  Advised to pt that she will need to be seen before orders.  Pt voices concerns that something is in her arm and is worried about something moving up to her heart.

## 2023-03-16 NOTE — Telephone Encounter (Signed)
 Patient will need to be seen before any imaging will be ordered

## 2023-03-16 NOTE — Telephone Encounter (Signed)
Please triage patient.

## 2023-03-16 NOTE — Progress Notes (Signed)
 [facility] Summary Report / Loop Recorder

## 2023-03-16 NOTE — Telephone Encounter (Signed)
 You will need to see in office to order any imaging right?

## 2023-03-16 NOTE — Telephone Encounter (Signed)
 See phone note

## 2023-03-16 NOTE — Telephone Encounter (Signed)
 Pt called to report that she had a pacer implant 03/06/23... she had swelling at the IV site and the inpatient nurses placed a warm compress on  it and asked that she do the same at home after discharge... it did not take long for the edema/ knot to improve but it is still tender to touch.. no edema, no redness... she says when she runs her finger over the top she feels like there is a "1/2 inch ling tube" in her arm... she said she did not notice it before.   I offered to have her come in today so I can take a look at it but she says she would have to have her daughter bring her and was not sure if she could. She is seeing her PCP this Wed 03/18/23.... she asked if it was "safe" to wait to have it looked at then.. I advised her that is was unusual and reassured her that a needle would not be in her arm but I was not present when the IV catheter was removed so I could not tel her for sure but it would be very unusual... she may try to go to an Urgent Care hoping they have ultrasound capability.   If not she will wait to have her PCP assess it at her OV.

## 2023-03-16 NOTE — Telephone Encounter (Signed)
 I spoke with pt; pt said she had IV put in hospital on 03/08/23 and removed on 03/10/23; pt said right after IV was placed in her arm she had pain from the IV. Pt said several people at hospital looked at it. Pt said there is still pain in arm where IV was placed, some swelling and a hard knot. Pt said she is afraid there is tubing loose in her arm. I spoke with Audria Nine NP and if pt is concerned about tubing in arm pt should go to ED for eval and possible [place]. [facility] cannot do an [place] and we do not know of an UC that does [place]. Matt said for pts peace of mind can go to ED today for eval and for pt to keep appt with him on 03/18/23 at 3:40. Pt said she is not driving and will try to get someone to take her to [facility] ED today. Sending FYI to Audria Nine NP.

## 2023-03-17 NOTE — Telephone Encounter (Signed)
 noted

## 2023-03-18 ENCOUNTER — Ambulatory Visit: Payer: Medicare Other | Admitting: Nurse Practitioner

## 2023-03-18 VITALS — BP 140/92 | HR 63 | Temp 98.1°F | Ht 66.0 in | Wt 200.0 lb

## 2023-03-18 DIAGNOSIS — T801XXA Vascular complications following infusion, transfusion and therapeutic injection, initial encounter: Secondary | ICD-10-CM

## 2023-03-18 DIAGNOSIS — I809 Phlebitis and thrombophlebitis of unspecified site: Secondary | ICD-10-CM | POA: Diagnosis not present

## 2023-03-18 DIAGNOSIS — Z09 Encounter for follow-up examination after completed treatment for conditions other than malignant neoplasm: Secondary | ICD-10-CM | POA: Diagnosis not present

## 2023-03-18 HISTORY — DX: Vascular complications following infusion, transfusion and therapeutic injection, initial encounter: T80.1XXA

## 2023-03-18 NOTE — Assessment & Plan Note (Signed)
 Did review ED note along with inpatient note, EP note and discharge.  Patient did have a pacemaker placed while in the hospital.  Did review most recent labs.  Follow-up with cardiology as recommended and scheduled.

## 2023-03-18 NOTE — Assessment & Plan Note (Signed)
 No worrying signs.  Patient is anticoagulated.  Patient to use warm or cool compresses.  Told her it can take at least 10+ days to go back to normal.

## 2023-03-18 NOTE — Progress Notes (Signed)
 Acute Office Visit  Subjective:     Patient ID: Shannon [name], female    DOB: Mar 25, 1950, 73 y.o.   MRN: [id number]  Chief Complaint  Patient presents with   Hospitalization Follow-up    Pt complains of tube left in right arm. Complains of pain level of 4. Hurts more when touched or moved.     HPI Patient is in today for hospital follow-up.  Patient went to the hospital on 03/07/2023 with chest pain.  Chest pain developed under the left breast and armpit area that day.  She has a loop recorder and was told that she had long sinus pauses when she converts out of A-fib and is scheduled to have a pacemaker.  Patient's troponin was slightly elevated patient was started on diltiazem drip.  She was admitted to the hospital and cardiology was consulted.  She is suppose to have a pacemaker installed. She was placed back on the coreg and she is having some SHOB  She mentoined that since having the IV she is having trouble  with the spot on her right arm. States that she had the IV place in the ED and then they had it removed on Tuesday. States that her right arm got swollen and hard to the touch prozimal. Stteas that told her to do compresses. States that she did not put anything on it. The swelling went down. States that she felt around and felt some swelling States that she is having some pain. No reddness or fever in it. States that she is not having any weakness or numbness. States no over the counter treatments.    Shortness of breath started this week. She has a histoyr of the same and has told cardilogy before. States that she had it when she was on it I nthe past and was stopped by cardiology becuasr of the sinus pauses. Now she has the pacemaker   States that she did stop takein gthe duloxeinte. Because othe ablation. States that she did start it back up and then stopped    Review of Systems  Constitutional:  Negative for chills and fever.  Respiratory:  Positive for shortness of  breath.   Cardiovascular:  Negative for chest pain.  Neurological:  Negative for headaches.  Psychiatric/Behavioral:  Negative for hallucinations and suicidal ideas.         Objective:    BP (!) 140/92   Pulse 63   Temp 98.1 F (36.7 C) (Oral)   Ht 5\' 6"  (1.676 m)   Wt 200 lb (90.7 kg)   SpO2 94%   BMI 32.28 kg/m  BP Readings from Last 3 Encounters:  03/18/23 (!) 140/92  03/10/23 (!) 143/77  02/27/23 (!) 146/84   Wt Readings from Last 3 Encounters:  03/18/23 200 lb (90.7 kg)  03/07/23 195 lb 5.2 oz (88.6 kg)  02/27/23 186 lb 3.2 oz (84.5 kg)   SpO2 Readings from Last 3 Encounters:  03/18/23 94%  03/10/23 99%  02/27/23 95%      Physical Exam Vitals and nursing note reviewed.  Constitutional:      Appearance: Normal appearance.  Cardiovascular:     Rate and Rhythm: Normal rate and regular rhythm.     Heart sounds: Normal heart sounds.  Pulmonary:     Effort: Pulmonary effort is normal.     Breath sounds: Normal breath sounds.  Skin:      Neurological:     Mental Status: She is alert.  Deep Tendon Reflexes:     Reflex Scores:      Bicep reflexes are 2+ on the right side and 2+ on the left side.    Comments: Bilateral upper extremity strength 5/5. Bilateral radial pulses 2+     No results found for any visits on 03/18/23.      Assessment & Plan:   Problem List Items Addressed This Visit       Cardiovascular and Mediastinum   Phlebitis after infusion   No worrying signs.  Patient is anticoagulated.  Patient to use warm or cool compresses.  Told her it can take at least 10+ days to go back to normal.        Other   Hospital discharge follow-up - Primary   Did review ED note along with inpatient note, EP note and discharge.  Patient did have a pacemaker placed while in the hospital.  Did review most recent labs.  Follow-up with cardiology as recommended and scheduled.       No orders of the defined types were placed in this  encounter.   Return in about 3 months (around 06/18/2023) for Mood/anxiety.  Audria Nine, NP

## 2023-03-18 NOTE — Patient Instructions (Signed)
 Nice to see you today I want to see you in 3 months  Use warm or cold compresses on the place on the arm. This will take another 10+ days to go away

## 2023-03-19 ENCOUNTER — Encounter (HOSPITAL_COMMUNITY): Payer: Medicare Other

## 2023-03-19 ENCOUNTER — Ambulatory Visit (HOSPITAL_COMMUNITY): Admit: 2023-03-19 | Payer: Medicare Other | Admitting: Cardiology

## 2023-03-19 SURGERY — PACEMAKER IMPLANT

## 2023-03-25 ENCOUNTER — Ambulatory Visit: Attending: Internal Medicine

## 2023-03-25 DIAGNOSIS — I483 Typical atrial flutter: Secondary | ICD-10-CM | POA: Diagnosis not present

## 2023-03-25 LAB — CUP PACEART INCLINIC DEVICE CHECK
Date Time Interrogation Session: 20250319203302
Implantable Lead Connection Status: 753985
Implantable Lead Connection Status: 753985
Implantable Lead Implant Date: 20250303
Implantable Lead Implant Date: 20250303
Implantable Lead Location: 753859
Implantable Lead Location: 753860
Implantable Lead Model: 3830
Implantable Lead Model: 5076
Implantable Pulse Generator Implant Date: 20250303

## 2023-03-25 NOTE — Progress Notes (Signed)
 Normal dual chamber pacemaker wound check. Presenting rhythm: AP/VS. Wound well healed. Routine testing performed. Thresholds, sensing, and impedances consistent with implant measurements and at 3.5V safety margin/auto capture until 3 month visit. AT/AF burden <0.1%. No AF noted. 2 VT events (1:1) with duration 1 second. Reviewed arm restrictions to continue for 6 weeks total post op.  Pt enrolled in remote follow-up.

## 2023-03-25 NOTE — Patient Instructions (Signed)

## 2023-03-26 ENCOUNTER — Ambulatory Visit: Payer: Medicare Other | Admitting: Behavioral Health

## 2023-03-26 ENCOUNTER — Encounter: Payer: Self-pay | Admitting: Behavioral Health

## 2023-03-26 DIAGNOSIS — F4323 Adjustment disorder with mixed anxiety and depressed mood: Secondary | ICD-10-CM

## 2023-03-26 NOTE — Progress Notes (Signed)
 [facility] Counselor/Therapist Progress Note  Patient ID: Shannon [name], MRN: [id number],    Date: March 20th, 2025  Time Spent: 58 minutes, 2 PM until 2:58 PM this session was held via video teletherapy. The patient consented to the video teletherapy and was located in her home during this session. She is aware it is the responsibility of the patient to secure confidentiality on her end of the session. The provider was in a private home office for the duration of this session.     Reported Symptoms: Anxiety, depression  Mental Status Exam: Appearance:  Well Groomed     Behavior: Appropriate  Motor: Normal  Speech/Language:  Clear and Coherent  Affect: Appropriate for  Mood: normal  Thought process: normal  Thought content:   WNL  Sensory/Perceptual disturbances:   WNL  Orientation: oriented to person, place, time/date, situation, day of week, month of year, and year  Attention: Good  Concentration: Good  Memory: WNL  Fund of knowledge:  Good  Insight:   Good  Judgment:  Good  Impulse Control: Good   Risk Assessment: Danger to Self:  No Self-injurious Behavior: No Danger to Others: No Duty to Warn:no Physical Aggression / Violence:No  Access to Firearms a concern: No  Gang Involvement:No   Subjective: It has been a difficult week for the patient.  Her husband has made some very poor choices.  She has had to set some much firmer boundaries including going to take out a restraining order tomorrow.  He has not physically shown up on the property but has said some things which were concerning to her.  She is meeting with an attorney tomorrow to start the proceedings for filing for divorce.  There are still some financial connections between the 2 of them and he is making some very poor choices and she is doing all that she can legally to make sure there is no financial connection between the 2 of them.  It puts her in a difficult place financially and she  acknowledges that there is some bitterness based on what he has done that put her in the financial shape that she was in which she could have been much more comfortable.  We are working through her being able to move forward with that.  She is aware of the supports that she has in her children and grandchildren siblings and friend.  Thankfully she had a pacemaker put in and she has not had any A-fib over the past week.  She has several meetings tomorrow's we talked about the importance of the use of her coping skills that she meets with different people who will move her closer to starting more permanent boundaries with her husband.  She does contract for safety having no thoughts of hurting herself or anyone else. Interventions: Cognitive Behavioral Therapy  Diagnosis: Adjustment disorder with mixed anxiety and depressed mood.  Plan: I will meet with the patient every 2 weeks via video session TX. Plan:To use cognitive behavioral therapy principles as well as elements of dialectical behavior therapy.  Goals are to reduce anxiety and depression by at least 50% with a target date of November 06, 2022.  Goals are to have less sadness as indicated by PHQ-9 scores as well as patient report.  We also work on improving mood and return to a healthier level of functioning as defined by her goals for being happy, identify causes and process triggers for depressed mood.  We will use cognitive behavioral therapy to  explore and replace unhealthy thoughts and behavior patterns contributing to depression.  I will continue to encourage shearing of feelings related to the causes and symptoms of depression as well as teach and encouraged use of coping skills for management of depressive symptoms.  We also will work to improve the patient's ability to manage anxiety symptoms and better handle stress, identify causes for anxiety and explore ways for reduction of anxiety in addition to resolving conflicts contributing to anxiety  and managing thoughts and worrisome thinking is contributing to anxiety.  Interventions will include providing education about anxiety, facilitate problem solution skills, teaching coping skills for managing anxiety such as grounding exercises, progressive muscle relaxation and cognitive re framing etc.  We will also use cognitive behavioral therapy to identify and change anxiety provoking thoughts and behavior patterns as well as using DBT distress tolerance and mindfulness skills. I reviewed the treatment plan goals with the patient who agreed to continue with goals as stated above. Progress: 35%New target date will be May 06, 2023. [name], [facility]                                    [name], [facility]               [name], [facility]               [name], [facility]               [name], [facility]               [name], [facility]               [name], [facility]

## 2023-03-28 ENCOUNTER — Encounter: Payer: Self-pay | Admitting: Cardiology

## 2023-03-31 ENCOUNTER — Encounter: Payer: Self-pay | Admitting: Cardiology

## 2023-04-03 ENCOUNTER — Ambulatory Visit: Payer: Medicare Other | Admitting: Behavioral Health

## 2023-04-03 DIAGNOSIS — F4323 Adjustment disorder with mixed anxiety and depressed mood: Secondary | ICD-10-CM | POA: Diagnosis not present

## 2023-04-03 NOTE — Progress Notes (Signed)
 [name] Counselor/Therapist Progress Note  Patient ID: Shannon [name], MRN: [id number],    Date: March 28th, 2025  Time Spent: 58 minutes, 3 PM until 3:58 PM this session was held via video teletherapy. The patient consented to the video teletherapy and was located in her home during this session. She is aware it is the responsibility of the patient to secure confidentiality on her end of the session. The provider was in a private home office for the duration of this session.     Reported Symptoms: Anxiety, depression  Mental Status Exam: Appearance:  Well Groomed     Behavior: Appropriate  Motor: Normal  Speech/Language:  Clear and Coherent  Affect: Appropriate for  Mood: normal  Thought process: normal  Thought content:   WNL  Sensory/Perceptual disturbances:   WNL  Orientation: oriented to person, place, time/date, situation, day of week, month of year, and year  Attention: Good  Concentration: Good  Memory: WNL  Fund of knowledge:  Good  Insight:   Good  Judgment:  Good  Impulse Control: Good   Risk Assessment: Danger to Self:  No Self-injurious Behavior: No Danger to Others: No Duty to Warn:no Physical Aggression / Violence:No  Access to Firearms a concern: No  Gang Involvement:No   Subjective: For the most part the patient is still feeling better physically not getting winded as easily and not having any A-fib episodes.  She is still perplexed by the choices that her husband is making but is not having any contact with him and for the most part any indication goes to their daughter.  She does not like the fact that her daughter has to deal with that but her daughter is willing to do this because the daughter and son note that there need to be clear boundaries that with the patient's husband.  They are waiting some options as to what to do with the house that they are living in.  They put a substantial amount of work and but there were a couple of  things which will cause significant money which are not sure if they want to spend it right now.  The daughter also would like to have another child and the patient will support her daughter in a way that she can.  For the most part the patient's mood has been stable and sleep has been relatively stable.  She is eating as well as she can, staying hydrated, compliant with medication and addressing any medical issues as they come up. She does contract for safety having no thoughts of hurting herself or anyone else. Interventions: Cognitive Behavioral Therapy  Diagnosis: Adjustment disorder with mixed anxiety and depressed mood.  Plan: I will meet with the patient every 2 weeks via video session TX. Plan:To use cognitive behavioral therapy principles as well as elements of dialectical behavior therapy.  Goals are to reduce anxiety and depression by at least 50% with a target date of November 06, 2022.  Goals are to have less sadness as indicated by PHQ-9 scores as well as patient report.  We also work on improving mood and return to a healthier level of functioning as defined by her goals for being happy, identify causes and process triggers for depressed mood.  We will use cognitive behavioral therapy to explore and replace unhealthy thoughts and behavior patterns contributing to depression.  I will continue to encourage shearing of feelings related to the causes and symptoms of depression as well as teach and encouraged use  of coping skills for management of depressive symptoms.  We also will work to improve the patient's ability to manage anxiety symptoms and better handle stress, identify causes for anxiety and explore ways for reduction of anxiety in addition to resolving conflicts contributing to anxiety and managing thoughts and worrisome thinking is contributing to anxiety.  Interventions will include providing education about anxiety, facilitate problem solution skills, teaching coping skills for  managing anxiety such as grounding exercises, progressive muscle relaxation and cognitive re framing etc.  We will also use cognitive behavioral therapy to identify and change anxiety provoking thoughts and behavior patterns as well as using DBT distress tolerance and mindfulness skills. I reviewed the treatment plan goals with the patient who agreed to continue with goals as stated above. Progress: 35%New target date will be May 06, 2023. Shannon [name], [facility]                                    Shannon [name], [facility]               Shannon [name], [facility]               Shannon [name], [facility]               Shannon [name], [facility]               Shannon [name], [facility]               Shannon [name], [facility]               Shannon [name], [facility]

## 2023-04-07 NOTE — Addendum Note (Signed)
 Addended by: Elease Etienne A on: 04/07/2023 10:17 AM   Modules accepted: Orders

## 2023-04-07 NOTE — Progress Notes (Signed)
 [facility] Summary Report / Loop Recorder

## 2023-04-10 ENCOUNTER — Ambulatory Visit: Payer: Medicare Other | Admitting: Behavioral Health

## 2023-04-10 ENCOUNTER — Encounter: Payer: Self-pay | Admitting: Behavioral Health

## 2023-04-10 ENCOUNTER — Ambulatory Visit: Admitting: Behavioral Health

## 2023-04-10 DIAGNOSIS — F4323 Adjustment disorder with mixed anxiety and depressed mood: Secondary | ICD-10-CM | POA: Diagnosis not present

## 2023-04-10 NOTE — Progress Notes (Signed)
 [facility] Counselor/Therapist Progress Note  Patient ID: Shannon [name], MRN: [id number],    Date:, April 10 2023  Time Spent: 55 minutes, 12 PM until 12:55 PM this session was held via video teletherapy. The patient consented to the video teletherapy and was located in her home during this session. She is aware it is the responsibility of the patient to secure confidentiality on her end of the session. The provider was in a private home office for the duration of this session.     Reported Symptoms: Anxiety, depression  Mental Status Exam: Appearance:  Well Groomed     Behavior: Appropriate  Motor: Normal  Speech/Language:  Clear and Coherent  Affect: Appropriate for  Mood: normal  Thought process: normal  Thought content:   WNL  Sensory/Perceptual disturbances:   WNL  Orientation: oriented to person, place, time/date, situation, day of week, month of year, and year  Attention: Good  Concentration: Good  Memory: WNL  Fund of knowledge:  Good  Insight:   Good  Judgment:  Good  Impulse Control: Good   Risk Assessment: Danger to Self:  No Self-injurious Behavior: No Danger to Others: No Duty to Warn:no Physical Aggression / Violence:No  Access to Firearms a concern: No  Gang Involvement:No   Subjective: It has been a difficult week for the patient.  She has mixed feelings but she sees that her husband is struggling and making poor decisions but she knows that she has set the correct boundaries.  She knows that she cannot fix things that he has done and has to take care of herself or her daughter and grandson etc.  Thankfully her health has been good throughout all of this although she acknowledges significant stress but she has set clear boundaries in relationships.  There has been an issue with the house that she lives in with her daughter and that he will caustic significant amount of money to repair something so they are looking at the best way to deal  with that and that has created some financial stress which may end up leading to a move which she does not want to do.  She is trying to look at everything to her spiritual lies to see that God has a plan for them and will orchestrated all in a way that is best for the patient and her family.  She continues to use her faith and coping skills as a way to deal with all of the stressors that have been going on and feel supported by her family and friends.  She does contract for safety having no thoughts of hurting herself or anyone else. Interventions: Cognitive Behavioral Therapy  Diagnosis: Adjustment disorder with mixed anxiety and depressed mood.  Plan: I will meet with the patient every 2 weeks via video session TX. Plan:To use cognitive behavioral therapy principles as well as elements of dialectical behavior therapy.  Goals are to reduce anxiety and depression by at least 50% with a target date of November 06, 2022.  Goals are to have less sadness as indicated by PHQ-9 scores as well as patient report.  We also work on improving mood and return to a healthier level of functioning as defined by her goals for being happy, identify causes and process triggers for depressed mood.  We will use cognitive behavioral therapy to explore and replace unhealthy thoughts and behavior patterns contributing to depression.  I will continue to encourage shearing of feelings related to the causes and symptoms of  depression as well as teach and encouraged use of coping skills for management of depressive symptoms.  We also will work to improve the patient's ability to manage anxiety symptoms and better handle stress, identify causes for anxiety and explore ways for reduction of anxiety in addition to resolving conflicts contributing to anxiety and managing thoughts and worrisome thinking is contributing to anxiety.  Interventions will include providing education about anxiety, facilitate problem solution skills, teaching  coping skills for managing anxiety such as grounding exercises, progressive muscle relaxation and cognitive re framing etc.  We will also use cognitive behavioral therapy to identify and change anxiety provoking thoughts and behavior patterns as well as using DBT distress tolerance and mindfulness skills. I reviewed the treatment plan goals with the patient who agreed to continue with goals as stated above. Progress: 35%New target date will be May 06, 2023. Shannon [name], [facility]                                    Shannon [name], [facility]               Shannon [name], [facility]               Shannon [name], [facility]               Shannon [name], [facility]               Shannon [name], [facility]               Shannon [name], [facility]               Shannon [name], [facility]               Shannon [name], [facility]

## 2023-04-16 ENCOUNTER — Ambulatory Visit: Admitting: Nurse Practitioner

## 2023-04-16 ENCOUNTER — Encounter: Payer: Self-pay | Admitting: Behavioral Health

## 2023-04-16 ENCOUNTER — Ambulatory Visit (INDEPENDENT_AMBULATORY_CARE_PROVIDER_SITE_OTHER): Admitting: Behavioral Health

## 2023-04-16 VITALS — BP 146/90 | HR 79 | Temp 97.9°F | Ht 66.0 in | Wt 199.4 lb

## 2023-04-16 DIAGNOSIS — T161XXA Foreign body in right ear, initial encounter: Secondary | ICD-10-CM

## 2023-04-16 DIAGNOSIS — H9201 Otalgia, right ear: Secondary | ICD-10-CM | POA: Insufficient documentation

## 2023-04-16 DIAGNOSIS — F4323 Adjustment disorder with mixed anxiety and depressed mood: Secondary | ICD-10-CM

## 2023-04-16 NOTE — Assessment & Plan Note (Signed)
 Secondary to FB. Tylenol OTC as directed

## 2023-04-16 NOTE — Assessment & Plan Note (Signed)
 Alligator forceps used after getting verbal consent from patient. Both FB removed. TM intact post procedure. Pt tolerated procedure well

## 2023-04-16 NOTE — Progress Notes (Signed)
 Acute Office Visit  Subjective:     Patient ID: Shannon [name], female    DOB: 1950-07-23, 73 y.o.   MRN: [id number]  Chief Complaint  Patient presents with   Ear Pain    Pt complains of right ear pain. States that there is a small black piece that goes at the end of the hearing aid. Pt states she pulled it out and did not see the black piece anymore.   Dental Pain    Pt had tooth extraction in June and complains of ongoing pain. She thinks its an abscess. Painful and feels like there is something in her mouth.       Patient is in today for /jaw pain  with a history of aflutter, htn, cba, osa, asthma, tinnitus  States that her right ear started to bother her. States that the other night she took her heariig aid out and she did not have the black rubber stopper piece. She is unsure if it is in the ear.  States that about 1 month ago her jaw started hurting where she had her tooth removed approx 1 year ago. States that  she stuck her tongue up there and it did not feel right. States that she has seen the dentist since the extraction.    State that she has taken tylenol that has helped. States that she feel like it is swollen   Review of Systems  Constitutional:  Negative for chills and fever.  HENT:  Positive for ear pain. Negative for ear discharge.        "+" jaw pain   Respiratory:  Negative for shortness of breath.   Cardiovascular:  Negative for chest pain.        Objective:    BP (!) 146/90   Pulse 79   Temp 97.9 F (36.6 C) (Oral)   Ht 5\' 6"  (1.676 m)   Wt 199 lb 6.4 oz (90.4 kg)   SpO2 98%   BMI 32.18 kg/m    Physical Exam Vitals and nursing note reviewed.  Constitutional:      Appearance: Normal appearance.  HENT:     Right Ear: Tympanic membrane and external ear normal.     Left Ear: Tympanic membrane, ear canal and external ear normal.     Ears:     Comments: Small black FB x2 in right ear canal     Mouth/Throat:     Mouth: Mucous membranes  are moist.     Pharynx: Oropharynx is clear.   Cardiovascular:     Rate and Rhythm: Normal rate and regular rhythm.     Heart sounds: Normal heart sounds.  Pulmonary:     Effort: Pulmonary effort is normal.     Breath sounds: Normal breath sounds.  Lymphadenopathy:     Cervical: No cervical adenopathy.  Neurological:     Mental Status: She is alert.     No results found for any visits on 04/16/23.      Assessment & Plan:   Problem List Items Addressed This Visit       Nervous and Auditory   FB ear, right, initial encounter   Alligator forceps used after getting verbal consent from patient. Both FB removed. TM intact post procedure. Pt tolerated procedure well         Other   Otalgia of right ear - Primary   Secondary to FB. Tylenol OTC as directed        No orders of the  defined types were placed in this encounter.   Return if symptoms worsen or fail to improve.  Audria Nine, NP

## 2023-04-16 NOTE — Patient Instructions (Addendum)
 Nice to see you today The ear canal maybe a little sore You can use tylenol over the counter as needed Follow up if it does not improve

## 2023-04-16 NOTE — Progress Notes (Signed)
 [facility] Counselor/Therapist Progress Note  Patient ID: Shannon [name], MRN: [id number],    Date:, April 16 2023  Time Spent: 55 minutes, 2:00pm    until 255       PM this session was held via video teletherapy. The patient consented to the video teletherapy and was located in her home during this session. She is aware it is the responsibility of the patient to secure confidentiality on her end of the session. The provider was in a private home office for the duration of this session.     Reported Symptoms: Anxiety, depression  Mental Status Exam: Appearance:  Well Groomed     Behavior: Appropriate  Motor: Normal  Speech/Language:  Clear and Coherent  Affect: Appropriate for  Mood: normal  Thought process: normal  Thought content:   WNL  Sensory/Perceptual disturbances:   WNL  Orientation: oriented to person, place, time/date, situation, day of week, month of year, and year  Attention: Good  Concentration: Good  Memory: WNL  Fund of knowledge:  Good  Insight:   Good  Judgment:  Good  Impulse Control: Good   Risk Assessment: Danger to Self:  No Self-injurious Behavior: No Danger to Others: No Duty to Warn:no Physical Aggression / Violence:No  Access to Firearms a concern: No  Gang Involvement:No   Subjective: It has been an active week for the patient and her daughter and grandson.  They decided to sell the house and have had 2 different investment type companies expressed interest in all they have not decided which one they feel like 1 or both could be very good options and a contract could be signed very soon.  They have signed a contract with hopes of renting a home which the patient is very excited about.  The downside will be that if it happens it will happen quickly and they will have to move out but they have already started packing.  She is looking at it as a new beginning and is excited about the possibilities.  For the most part physically she  is done well with no heart issues.  She is having an issue either with her jaw from having some oral surgery done or from her ear but she is addressing that medically this afternoon.  For the most part she has set clear boundaries with her husband.  She is looking at reconnecting with some friends as supports.  Her anxiety has been manageable.  She does contract for safety having no thoughts of hurting herself or anyone else. Interventions: Cognitive Behavioral Therapy  Diagnosis: Adjustment disorder with mixed anxiety and depressed mood.  Plan: I will meet with the patient every 2 weeks via video session TX. Plan:To use cognitive behavioral therapy principles as well as elements of dialectical behavior therapy.  Goals are to reduce anxiety and depression by at least 50% with a target date of November 06, 2022.  Goals are to have less sadness as indicated by PHQ-9 scores as well as patient report.  We also work on improving mood and return to a healthier level of functioning as defined by her goals for being happy, identify causes and process triggers for depressed mood.  We will use cognitive behavioral therapy to explore and replace unhealthy thoughts and behavior patterns contributing to depression.  I will continue to encourage shearing of feelings related to the causes and symptoms of depression as well as teach and encouraged use of coping skills for management of depressive symptoms.  We  also will work to improve the patient's ability to manage anxiety symptoms and better handle stress, identify causes for anxiety and explore ways for reduction of anxiety in addition to resolving conflicts contributing to anxiety and managing thoughts and worrisome thinking is contributing to anxiety.  Interventions will include providing education about anxiety, facilitate problem solution skills, teaching coping skills for managing anxiety such as grounding exercises, progressive muscle relaxation and cognitive re  framing etc.  We will also use cognitive behavioral therapy to identify and change anxiety provoking thoughts and behavior patterns as well as using DBT distress tolerance and mindfulness skills. I reviewed the treatment plan goals with the patient who agreed to continue with goals as stated above. Progress: 35%New target date will be May 06, 2023. [name], [facility]                                    [name], [facility]               [name], [facility]               [name], [facility]               [name], [facility]               [name], [facility]               [name], [facility]               [name], [facility]               [name], [facility]               [name], [facility]

## 2023-04-17 ENCOUNTER — Ambulatory Visit: Payer: Medicare Other | Admitting: Behavioral Health

## 2023-04-20 DIAGNOSIS — H43391 Other vitreous opacities, right eye: Secondary | ICD-10-CM | POA: Diagnosis not present

## 2023-04-21 ENCOUNTER — Ambulatory Visit

## 2023-04-21 DIAGNOSIS — I495 Sick sinus syndrome: Secondary | ICD-10-CM | POA: Diagnosis not present

## 2023-04-23 LAB — CUP PACEART REMOTE DEVICE CHECK
Battery Remaining Longevity: 147 mo
Battery Voltage: 3.21 V
Brady Statistic AP VP Percent: 0.11 %
Brady Statistic AP VS Percent: 93.41 %
Brady Statistic AS VP Percent: 0.01 %
Brady Statistic AS VS Percent: 6.47 %
Brady Statistic RA Percent Paced: 94.17 %
Brady Statistic RV Percent Paced: 0.12 %
Date Time Interrogation Session: 20250416092140
Implantable Lead Connection Status: 753985
Implantable Lead Connection Status: 753985
Implantable Lead Implant Date: 20250303
Implantable Lead Implant Date: 20250303
Implantable Lead Location: 753859
Implantable Lead Location: 753860
Implantable Lead Model: 3830
Implantable Lead Model: 5076
Implantable Pulse Generator Implant Date: 20250303
Lead Channel Impedance Value: 361 Ohm
Lead Channel Impedance Value: 418 Ohm
Lead Channel Impedance Value: 494 Ohm
Lead Channel Impedance Value: 570 Ohm
Lead Channel Pacing Threshold Amplitude: 0.375 V
Lead Channel Pacing Threshold Amplitude: 0.625 V
Lead Channel Pacing Threshold Pulse Width: 0.4 ms
Lead Channel Pacing Threshold Pulse Width: 0.4 ms
Lead Channel Sensing Intrinsic Amplitude: 2.875 mV
Lead Channel Sensing Intrinsic Amplitude: 2.875 mV
Lead Channel Sensing Intrinsic Amplitude: 4.5 mV
Lead Channel Sensing Intrinsic Amplitude: 4.5 mV
Lead Channel Setting Pacing Amplitude: 3.5 V
Lead Channel Setting Pacing Amplitude: 3.5 V
Lead Channel Setting Pacing Pulse Width: 0.4 ms
Lead Channel Setting Sensing Sensitivity: 1.2 mV
Zone Setting Status: 755011
Zone Setting Status: 755011

## 2023-04-28 ENCOUNTER — Encounter: Payer: Self-pay | Admitting: Cardiology

## 2023-05-01 ENCOUNTER — Encounter: Payer: Self-pay | Admitting: Behavioral Health

## 2023-05-01 ENCOUNTER — Ambulatory Visit: Payer: Medicare Other | Admitting: Behavioral Health

## 2023-05-01 DIAGNOSIS — F4323 Adjustment disorder with mixed anxiety and depressed mood: Secondary | ICD-10-CM

## 2023-05-01 DIAGNOSIS — F411 Generalized anxiety disorder: Secondary | ICD-10-CM

## 2023-05-01 NOTE — Progress Notes (Signed)
 [facility] Counselor/Therapist Progress Note  Patient ID: Shannon [name], MRN: [id number],    Date:, April 4/25 2025  Time Spent: 52 minutes, 3:00pm until 3:52 PM this session was held via video teletherapy. The patient consented to the video teletherapy and was located in her home during this session. She is aware it is the responsibility of the patient to secure confidentiality on her end of the session. The provider was in a private home office for the duration of this session.     Reported Symptoms: Anxiety, depression  Mental Status Exam: Appearance:  Well Groomed     Behavior: Appropriate  Motor: Normal  Speech/Language:  Clear and Coherent  Affect: Appropriate for  Mood: normal  Thought process: normal  Thought content:   WNL  Sensory/Perceptual disturbances:   WNL  Orientation: oriented to person, place, time/date, situation, day of week, month of year, and year  Attention: Good  Concentration: Good  Memory: WNL  Fund of knowledge:  Good  Insight:   Good  Judgment:  Good  Impulse Control: Good   Risk Assessment: Danger to Self:  No Self-injurious Behavior: No Danger to Others: No Duty to Warn:no Physical Aggression / Violence:No  Access to Firearms a concern: No  Gang Involvement:No   Subjective: The patient and her daughter started moving into their new house last weekend.  They are very happy with the house and the location.  They have not sold the house that they feel confident that 1 in the investors that they have spoken to well by that she says has taken a lot of stress off of both of them.  Complains of things that have taken place with her husband in which now her daughter has had to set some very clear boundaries with the patient's husband and she said that is a good thing but she knew that her daughter had to get to the place where that was right for her.  Physically the patient other than being tired from moving has found very good and  knows it is attributed to the positive changes she is making to her life in several different areas including restrengthening her faith.  They will looking for charges after they get settled into the new home.  The sister whom she does not have a good relationship with said some very unkind things about her daughter but her daughter set boundaries with her to the patient says she knows that is a negative influence and has had to set some of her own boundaries in that area of her life also.  The patient feels that she is making significant strides and sees a reduction in anxiety improvement in depression. She does contract for safety having no thoughts of hurting herself or anyone else. Interventions: Cognitive Behavioral Therapy  Diagnosis: Adjustment disorder with mixed anxiety and depressed mood.  Plan: I will meet with the patient every 2 weeks via video session TX. Plan:To use cognitive behavioral therapy principles as well as elements of dialectical behavior therapy.  Goals are to reduce anxiety and depression by at least 50% with a target date of November 06, 2022.  Goals are to have less sadness as indicated by PHQ-9 scores as well as patient report.  We also work on improving mood and return to a healthier level of functioning as defined by her goals for being happy, identify causes and process triggers for depressed mood.  We will use cognitive behavioral therapy to explore and replace unhealthy thoughts and  behavior patterns contributing to depression.  I will continue to encourage shearing of feelings related to the causes and symptoms of depression as well as teach and encouraged use of coping skills for management of depressive symptoms.  We also will work to improve the patient's ability to manage anxiety symptoms and better handle stress, identify causes for anxiety and explore ways for reduction of anxiety in addition to resolving conflicts contributing to anxiety and managing thoughts and  worrisome thinking is contributing to anxiety.  Interventions will include providing education about anxiety, facilitate problem solution skills, teaching coping skills for managing anxiety such as grounding exercises, progressive muscle relaxation and cognitive re framing etc.  We will also use cognitive behavioral therapy to identify and change anxiety provoking thoughts and behavior patterns as well as using DBT distress tolerance and mindfulness skills. I reviewed the treatment plan goals with the patient who agreed to continue with goals as stated above. Progress: 35%New target date will be May 06, 2023. Cecile Coder, [facility]                                    Cecile Coder, [facility]               Cecile Coder, [facility]               Cecile Coder, [facility]               Cecile Coder, [facility]               Cecile Coder, [facility]               Cecile Coder, [facility]               Cecile Coder, [facility]               Cecile Coder, [facility]               Cecile Coder, [facility]               Cecile Coder, [facility]

## 2023-05-08 ENCOUNTER — Encounter: Payer: Self-pay | Admitting: Behavioral Health

## 2023-05-08 ENCOUNTER — Ambulatory Visit: Admitting: Behavioral Health

## 2023-05-08 DIAGNOSIS — F4323 Adjustment disorder with mixed anxiety and depressed mood: Secondary | ICD-10-CM | POA: Diagnosis not present

## 2023-05-08 DIAGNOSIS — F411 Generalized anxiety disorder: Secondary | ICD-10-CM

## 2023-05-08 NOTE — Progress Notes (Addendum)
 [facility] Counselor/Therapist Progress Note  Patient ID: Shannon [name], MRN: [id number],    Date:, May 11 2023  Time Spent: 54 minutes, 3:00pm until 3:54 PM this session was held via video teletherapy. The patient consented to the video teletherapy and was located in her home during this session. She is aware it is the responsibility of the patient to secure confidentiality on her end of the session. The provider was in a private home office for the duration of this session.     Reported Symptoms: Anxiety, depression  Mental Status Exam: Appearance:  Well Groomed     Behavior: Appropriate  Motor: Normal  Speech/Language:  Clear and Coherent  Affect: Appropriate for  Mood: normal  Thought process: normal  Thought content:   WNL  Sensory/Perceptual disturbances:   WNL  Orientation: oriented to person, place, time/date, situation, day of week, month of year, and year  Attention: Good  Concentration: Good  Memory: WNL  Fund of knowledge:  Good  Insight:   Good  Judgment:  Good  Impulse Control: Good   Risk Assessment: Danger to Self:  No Self-injurious Behavior: No Danger to Others: No Duty to Warn:no Physical Aggression / Violence:No  Access to Firearms a concern: No  Gang Involvement:No   Subjective: The patient is tired but feels that she and her daughter are making progress.  They are moving things into the home and although there is lots when blocks are starting to get settled.  They just officially listed their old house today and are optimistic that it will sell fairly quickly.  There are only a few things to do but a lot is already been done in terms of upgrading that home so she feels confident.  She has done a good job setting boundaries but her daughter has now started setting boundaries with family members and she sees that taking a lot of stress off of her and her daughter.  Physically she is doing well saying she has not had any heart issues  since having a pacemaker put in.  She is tired because she is doing more physical activity but says she knows her body well enough to know what is time to sit down and rest for a while and her daughter is doing the same thing.  She is using her coping skills that she needs for stress relief but most of that is related to the move.  She does contract for safety having no thoughts of hurting herself or anyone else. Interventions: Cognitive Behavioral Therapy  Diagnosis: Adjustment disorder with mixed anxiety and depressed mood.  Plan: I will meet with the patient every 2 weeks via video session TX. Plan:To use cognitive behavioral therapy principles as well as elements of dialectical behavior therapy.  Goals are to reduce anxiety and depression by at least 50% with a target date of November 06, 2022.  Goals are to have less sadness as indicated by PHQ-9 scores as well as patient report.  We also work on improving mood and return to a healthier level of functioning as defined by her goals for being happy, identify causes and process triggers for depressed mood.  We will use cognitive behavioral therapy to explore and replace unhealthy thoughts and behavior patterns contributing to depression.  I will continue to encourage shearing of feelings related to the causes and symptoms of depression as well as teach and encouraged use of coping skills for management of depressive symptoms.  We also will work to improve  the patient's ability to manage anxiety symptoms and better handle stress, identify causes for anxiety and explore ways for reduction of anxiety in addition to resolving conflicts contributing to anxiety and managing thoughts and worrisome thinking is contributing to anxiety.  Interventions will include providing education about anxiety, facilitate problem solution skills, teaching coping skills for managing anxiety such as grounding exercises, progressive muscle relaxation and cognitive re framing etc.   We will also use cognitive behavioral therapy to identify and change anxiety provoking thoughts and behavior patterns as well as using DBT distress tolerance and mindfulness skills. I reviewed the treatment plan goals with the patient who agreed to continue with goals as stated above. Progress: 35%New target date will be October 31st, 2025. Cecile Coder, [facility]

## 2023-05-12 ENCOUNTER — Telehealth: Payer: Self-pay

## 2023-05-12 NOTE — Telephone Encounter (Signed)
 Contacted patient to see how she is doing and ask if she was ready to schedule her colonoscopy. She stated that since then she had a pacemaker put in and has not had anymore problems with A-Fib.  She said she would like to have her colonoscopy scheduled.   Advised that I will start the process and send over her cardiac clearance to her cardiologist.  Once I've received clearance noting blood thinner advice for Eliquis  I will call her back to schedule.  Thanks, [place], [place]

## 2023-05-13 ENCOUNTER — Other Ambulatory Visit: Payer: Self-pay | Admitting: Nurse Practitioner

## 2023-05-15 ENCOUNTER — Encounter: Payer: Self-pay | Admitting: Behavioral Health

## 2023-05-15 ENCOUNTER — Ambulatory Visit (INDEPENDENT_AMBULATORY_CARE_PROVIDER_SITE_OTHER): Admitting: Behavioral Health

## 2023-05-15 ENCOUNTER — Telehealth: Payer: Self-pay | Admitting: *Deleted

## 2023-05-15 DIAGNOSIS — F4323 Adjustment disorder with mixed anxiety and depressed mood: Secondary | ICD-10-CM | POA: Diagnosis not present

## 2023-05-15 NOTE — Progress Notes (Signed)
 [facility] Counselor/Therapist Progress Note  Patient ID: Shannon [name], MRN: [id number],    Date:,  May 15, 2023  Time Spent: 54 minutes, 3:00pm until 3:54 PM this session was held via video teletherapy. The patient consented to the video teletherapy and was located in her home during this session. She is aware it is the responsibility of the patient to secure confidentiality on her end of the session. The provider was in a private home office for the duration of this session.     Reported Symptoms: Anxiety, depression  Mental Status Exam: Appearance:  Well Groomed     Behavior: Appropriate  Motor: Normal  Speech/Language:  Clear and Coherent  Affect: Appropriate for  Mood: normal  Thought process: normal  Thought content:   WNL  Sensory/Perceptual disturbances:   WNL  Orientation: oriented to person, place, time/date, situation, day of week, month of year, and year  Attention: Good  Concentration: Good  Memory: WNL  Fund of knowledge:  Good  Insight:   Good  Judgment:  Good  Impulse Control: Good   Risk Assessment: Danger to Self:  No Self-injurious Behavior: No Danger to Others: No Duty to Warn:no Physical Aggression / Violence:No  Access to Firearms a concern: No  Gang Involvement:No   Subjective: The patient and her daughter continue to make headway in getting everything in fact and then the home that they ran but says the work is slow.  Her daughter is working full-time, the patient is watching her grandson and they are trying to get things closed out at the old home.  They have had several people look and she feels confident that it will sell.  She feels like she is setting good boundaries in terms of how much she does not unpacking the house taking care of her grandson and practicing good self-care.  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  She said she did not feel the typical  physical symptoms but did go to the restroom more during the night recognizing now what that might be a symptom she needs to pay attention to.  She is not concerned knowing that she feels much better but she is aware of how much she is working and the need for rest.  She also spoke to her husband's sister and brother-in-law who came to get some things from the old home that were her husband's family's items and they understood better after talking to her the situation based on her husband's choices.  She has not spoken to her sister and brother is much and she is not sure whether that is but we will keep an eye on that too.  She still feels like they as best they can understand her situation. She is using her coping skills that she needs for stress relief but most of that is related to the move.  She does contract for safety having no thoughts of hurting herself or anyone else. Interventions: Cognitive Behavioral Therapy  Diagnosis: Adjustment disorder with mixed anxiety and depressed mood.  Plan: I will meet with the patient every 2 weeks via video session TX. Plan:To use cognitive behavioral therapy principles as well as elements of dialectical behavior therapy.  Goals are to reduce anxiety and depression by at least 50% with a target date of November 06, 2022.  Goals are to have less sadness as indicated by PHQ-9 scores as well as patient report.  We also work on improving mood and return to a healthier level of functioning as defined by her goals for being happy, identify causes and process triggers for depressed mood.  We will use cognitive behavioral therapy to explore and replace unhealthy thoughts and behavior patterns contributing to depression.  I will continue to encourage shearing of feelings related to the causes and symptoms of depression as well as teach and encouraged use of coping skills for management of depressive symptoms.  We also will work to improve the patient's ability to manage  anxiety symptoms and better handle stress, identify causes for anxiety and explore ways for reduction of anxiety in addition to resolving conflicts contributing to anxiety and managing thoughts and worrisome thinking is contributing to anxiety.  Interventions will include providing education about anxiety, facilitate problem solution skills, teaching coping skills for managing anxiety such as grounding exercises, progressive muscle relaxation and cognitive re framing etc.  We will also use cognitive behavioral therapy to identify and change anxiety provoking thoughts and behavior patterns as well as using DBT distress tolerance and mindfulness skills. I reviewed the treatment plan goals with the patient who agreed to continue with goals as stated above. Progress: 35%New target date will be October 31st, 2025. Cecile Coder, [facility]                                                Cecile Coder, [facility]

## 2023-05-15 NOTE — Telephone Encounter (Signed)
   Pre-operative Risk Assessment    Patient Name: Shannon [name]  DOB: 08/04/1950 MRN: [id number]   Date of last office visit: 02/27/23 DR. LAMBERT Date of next office visit: NONE   Request for Surgical Clearance    Procedure:  COLONOSCOPY  Date of Surgery:  Clearance TBD                                Surgeon:  DR. DARREN WOHL Surgeon's Group or Practice Name:  [facility] Phone number:  [phone] Alberteen Aloe, CMA Fax number:  [phone]   Type of Clearance Requested:   - Medical  - Pharmacy:  Hold Apixaban  (Eliquis )     Type of Anesthesia:  General    Additional requests/questions:    Princ[place] Broom   05/15/2023, 12:49 PM

## 2023-05-18 NOTE — Telephone Encounter (Signed)
 Patient with diagnosis of Afib on Eliquis  for anticoagulation.    Procedure: COLONOSCOPY  Date of procedure: TBD   CHA2DS2-VASc Score = 6   This indicates a 9.7% annual risk of stroke. The patient's score is based upon: CHF History: 1 HTN History: 1 Diabetes History: 0 Stroke History: 2 Vascular Disease History: 0 Age Score: 1 Gender Score: 1    CrCl 91 mL/min Platelet count 288 K  Patient has/has not  had an Afib/aflutter ablation within the last 3 months or DCCV within the last 30 days   Per office protocol, patient can hold Eliquis  for 2 days prior to procedure.     **This guidance is not considered finalized until pre-operative APP has relayed final recommendations.**

## 2023-05-18 NOTE — Telephone Encounter (Signed)
 I will update the surgeons office that the pt has an IN OFFICE appt with [name], NP. For preop clearance evaluation also.

## 2023-05-18 NOTE — Telephone Encounter (Signed)
   Name: Shannon [name]  DOB: 06-17-1950  MRN: [id number]  Primary Cardiologist: None  Chart reviewed as part of pre-operative protocol coverage. Because of [name]'s past medical history and time since last visit, she will require a follow-up in-office visit in order to better assess preoperative cardiovascular risk.  Patient has an office visit scheduled on 06/09/2023 with [name], NP. Appointment notes have been updated to reflect need for pre-op evaluation.   Pre-op covering staff:  - Please contact requesting surgeon's office via preferred method (i.e, phone, fax) to inform them of need for appointment prior to surgery.  This message will also be routed to pharmacy pool for input on holding Eliquis  as requested below so that this information is available to the clearing provider at time of patient's appointment.   Morey Ar, NP  05/18/2023, 12:14 PM

## 2023-05-18 NOTE — Telephone Encounter (Signed)
Please advise holding Eliquis prior to colonoscopy.  Thank you!  DW  

## 2023-05-21 ENCOUNTER — Ambulatory Visit: Admitting: Behavioral Health

## 2023-05-21 ENCOUNTER — Encounter: Payer: Self-pay | Admitting: Behavioral Health

## 2023-05-21 DIAGNOSIS — F4323 Adjustment disorder with mixed anxiety and depressed mood: Secondary | ICD-10-CM

## 2023-05-21 DIAGNOSIS — F411 Generalized anxiety disorder: Secondary | ICD-10-CM

## 2023-05-21 NOTE — Progress Notes (Signed)
 [facility] Counselor/Therapist Progress Note  Patient ID: Shannon [name], MRN: [id number],    Date:,  May 21, 2023  Time Spent: 50 minutes, 3:00pm until 3:50 PM this session was held via video teletherapy. The patient consented to the video teletherapy and was located in her home during this session. She is aware it is the responsibility of the patient to secure confidentiality on her end of the session. The provider was in a private home office for the duration of this session.     Reported Symptoms: Anxiety, depression  Mental Status Exam: Appearance:  Well Groomed     Behavior: Appropriate  Motor: Normal  Speech/Language:  Clear and Coherent  Affect: Appropriate for  Mood: normal  Thought process: normal  Thought content:   WNL  Sensory/Perceptual disturbances:   WNL  Orientation: oriented to person, place, time/date, situation, day of week, month of year, and year  Attention: Good  Concentration: Good  Memory: WNL  Fund of knowledge:  Good  Insight:   Good  Judgment:  Good  Impulse Control: Good   Risk Assessment: Danger to Self:  No Self-injurious Behavior: No Danger to Others: No Duty to Warn:no Physical Aggression / Violence:No  Access to Firearms a concern: No  Gang Involvement:No   Subjective: The patient is tired.  She has had A-fib for the past 2 days and although it has not been as bad as it has been in the past it was still fairly constant which takes a toll on her physically.  She feels that a lot of it was triggered by moving and unpacking as well as keeping up with her 72-year-old grandson.  She is listening to her body and stops when the A-fib feels uncomfortable and rest.  She is napping at times with her grandson when he does.  She does as much as she can during the day but knows that by the end of the day she is very tired.  I encouraged her to compartmentalize the move saying they still have time to get everything out of the old house  and do not have to be into any rush to get everything unpacked.  She is happy to be aware she is.  She talked to her brother and they are in a good place.  She has not spoken to her younger sister but her daughter is talking to her and she feels that will all be okay.  She is set very clear boundaries with the sister that she does not have relationship with and with her husband.  She is sleeping much better at night and feels that her medications are in a good place to. She does contract for safety having no thoughts of hurting herself or anyone else. Interventions: Cognitive Behavioral Therapy  Diagnosis: Adjustment disorder with mixed anxiety and depressed mood.  Plan: I will meet with the patient every 2 weeks via video session TX. Plan:To use cognitive behavioral therapy principles as well as elements of dialectical behavior therapy.  Goals are to reduce anxiety and depression by at least 50% with a target date of November 06, 2022.  Goals are to have less sadness as indicated by PHQ-9 scores as well as patient report.  We also work on improving mood and return to a healthier level of functioning as defined by her goals for being happy, identify causes and process triggers for depressed mood.  We will use cognitive behavioral therapy to explore and replace unhealthy thoughts and behavior patterns contributing  to depression.  I will continue to encourage shearing of feelings related to the causes and symptoms of depression as well as teach and encouraged use of coping skills for management of depressive symptoms.  We also will work to improve the patient's ability to manage anxiety symptoms and better handle stress, identify causes for anxiety and explore ways for reduction of anxiety in addition to resolving conflicts contributing to anxiety and managing thoughts and worrisome thinking is contributing to anxiety.  Interventions will include providing education about anxiety, facilitate problem solution  skills, teaching coping skills for managing anxiety such as grounding exercises, progressive muscle relaxation and cognitive re framing etc.  We will also use cognitive behavioral therapy to identify and change anxiety provoking thoughts and behavior patterns as well as using DBT distress tolerance and mindfulness skills. I reviewed the treatment plan goals with the patient who agreed to continue with goals as stated above. Progress: 35%New target date will be October 31st, 2025. Cecile Coder, [facility]                                                Cecile Coder, [facility]               Cecile Coder, [facility]

## 2023-05-26 ENCOUNTER — Other Ambulatory Visit: Payer: Self-pay | Admitting: Nurse Practitioner

## 2023-05-26 DIAGNOSIS — F411 Generalized anxiety disorder: Secondary | ICD-10-CM

## 2023-05-29 ENCOUNTER — Telehealth: Payer: Self-pay | Admitting: Nurse Practitioner

## 2023-05-29 ENCOUNTER — Encounter: Payer: Self-pay | Admitting: Behavioral Health

## 2023-05-29 ENCOUNTER — Other Ambulatory Visit: Payer: Self-pay | Admitting: Nurse Practitioner

## 2023-05-29 ENCOUNTER — Ambulatory Visit (INDEPENDENT_AMBULATORY_CARE_PROVIDER_SITE_OTHER): Admitting: Behavioral Health

## 2023-05-29 ENCOUNTER — Other Ambulatory Visit: Payer: Self-pay

## 2023-05-29 DIAGNOSIS — F4323 Adjustment disorder with mixed anxiety and depressed mood: Secondary | ICD-10-CM | POA: Diagnosis not present

## 2023-05-29 DIAGNOSIS — F411 Generalized anxiety disorder: Secondary | ICD-10-CM

## 2023-05-29 MED ORDER — CARVEDILOL 6.25 MG PO TABS: 18.7500 mg | ORAL_TABLET | Freq: Two times a day (BID) | ORAL | 3 refills | Status: DC
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
## 2023-05-29 NOTE — Telephone Encounter (Signed)
 Copied from CRM [id number]. Topic: Clinical - Medication Refill >> May 29, 2023 11:34 AM Adaysia C wrote: Medication: albuterol  (VENTOLIN  HFA) 108 (90 Base) MCG/ACT inhaler  Has the patient contacted their pharmacy? No, pt initiated Rx refill request through providers clinic (Agent: If no, request that the patient contact the pharmacy for the refill. If patient does not wish to contact the pharmacy document the reason why and proceed with request.) (Agent: If yes, when and what did the pharmacy advise?)  This is the patient's preferred pharmacy:  [facility] [place] [place] - [address] [place] [address] Phone: [phone] Fax: [phone]   Is this the correct pharmacy for this prescription? Yes If no, delete pharmacy and type the correct one.   Has the prescription been filled recently? Yes  Is the patient out of the medication? Yes  Has the patient been seen for an appointment in the last year OR does the patient have an upcoming appointment? Yes  Can we respond through MyChart? Yes  Agent: Please be advised that Rx refills may take up to 3 business days. We ask that you follow-up with your pharmacy.

## 2023-06-05 ENCOUNTER — Encounter: Payer: Self-pay | Admitting: Behavioral Health

## 2023-06-05 ENCOUNTER — Ambulatory Visit: Admitting: Behavioral Health

## 2023-06-05 DIAGNOSIS — F4323 Adjustment disorder with mixed anxiety and depressed mood: Secondary | ICD-10-CM

## 2023-06-05 NOTE — Addendum Note (Signed)
 Addended by: Lott Rouleau A on: 06/05/2023 04:11 PM   Modules accepted: Orders

## 2023-06-05 NOTE — Progress Notes (Signed)
 Remote pacemaker transmission.
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
## 2023-06-07 ENCOUNTER — Encounter: Payer: Self-pay | Admitting: Cardiology

## 2023-06-07 NOTE — Progress Notes (Unsigned)
 Cardiology Office Note Date:  06/09/2023  Patient ID:  Shannon, [name] [birth year], MRN [id number] PCP:  Dorothe Gaster, NP  Cardiologist:  None Electrophysiologist: Jolly Needle > Boyce Byes, MD     Chief Complaint:  routine 90d PPM implant follow-up; pre-op evaluation  History of Present Illness: Shannon [name] is a 73 y.o. female with PMH notable for parox afib, SND s/p PPM,  HTN, orthostatic hypotension, OSA on CPAP, CHF, drug-induced lupus, CVA, anxiety; seen today for Boyce Byes, MD for routine PPM implant follow-up and pre-op evaluation prior to colonoscopy.   She is s/p AF ablation 12/2021 with R-sided PVI and posterior wall. This was 3rd ablation. She has previously failed tikosyn  and amiodarone . She had an [name] in place to monitor for AFib.  Her [name] alerted office on 10/27/2022 for multiple symptom triggers during AFib w RVR episode. She also had a 4 sec pause after this episode but was asymptomatic at that time.   She continued to have post-conversion pauses with LH and ultimately had PPM implanted 03/2023.   On follow-up today, she continue to experience intermittent episodes of atrial fibrillation, which have returned after initially subsiding post-pacemaker implantation. These episodes are often stress-induced and can last for days, with symptoms of lightheadedness and an elevated heart rate. She is on carvedilol  BID and Eliquis  for anticoagulation. NO bleeding concerns on eliquis .  She is having shortness of breath that occurs primarily with exertion, such as walking to the mailbox or attending a ball game, accompanied by chest heaviness and difficulty taking a deep breath. This seems to have worsened over the past few months. Denies frank chest pain, N/V, clamminess.   She has no complaints regarding her PPM.   Device Information: MDT dual chamber PPM, imp 03/2023; dx SND   AFib hx Diagnosed 2017 PVI ablation 03/25/2016 PVI ablation  01/31/20 STROKE on compliant and appropriately dosed Xarelto  02/2021 > Eliquis  PVI ablation 12/13/21   AAD Hx Flecainide  2017 >> stopped May 2018 2/2 c/o fatigue  >> resumed Jan 2020 with recurrent AF episodes >> stopped April 2022, stopped daily use d/t ablation >> PRN >> off Tikosyn  started Dec 2023 >> stopped during load 2/2 QT prolongation Amiodarone  started Dec 2023 > intolerant w/ blurred vision, fatigue, SOB, mood swings   ROS:  Please see the history of present illness. All other systems are reviewed and otherwise negative.   PHYSICAL EXAM:  VS:  BP 130/72   Pulse 83   Ht 5\' 7"  (1.702 m)   Wt 194 lb 9.6 oz (88.3 kg)   SpO2 98%   BMI 30.48 kg/m  BMI: Body mass index is 30.48 kg/m.  Wt Readings from Last 3 Encounters:  06/09/23 194 lb 9.6 oz (88.3 kg)  04/16/23 199 lb 6.4 oz (90.4 kg)  03/18/23 200 lb (90.7 kg)    GEN- The patient is well appearing, alert and oriented x 3 today.   Lungs- Clear to ausculation bilaterally, normal work of breathing.  Heart- Regular rate and rhythm, no murmurs, rubs or gallops Extremities- No peripheral edema, warm, dry Skin-  PPM device pocket without hematoma, tethering  Device interrogation done today and reviewed by myself:  Battery 13 years Lead thresholds, impedence, sensing stable  Adjusted output for chronic leads Freq AF episodes, burden ~6% Low VP No further changes made today   EKG is ordered. Personal review of EKG from today shows:   EKG Interpretation Date/Time:  Tuesday June 09 2023 12:52:30 EDT  Ventricular Rate:  83 PR Interval:  176 QRS Duration:  82 QT Interval:  420 QTC Calculation: 493 R Axis:   15  Text Interpretation: Atrial-paced rhythm Confirmed by Linard Daft [phone]) on 06/09/2023 3:30:26 PM     Additional studies reviewed include: Previous EP, cardiology notes.   TTE, 10/23/2022  1. Left ventricular ejection fraction, by estimation, is 60 to 65%. The left ventricle has normal function. The left  ventricle has no regional wall motion abnormalities. There is mild concentric left ventricular hypertrophy. Left ventricular diastolic parameters are indeterminate.   2. Right ventricular systolic function is normal. The right ventricular size is normal.   3. Left atrial size was moderately dilated.   4. Right atrial size was moderately dilated.   5. The mitral valve is normal in structure. Mild to moderate mitral valve regurgitation. No evidence of mitral stenosis.   6. The aortic valve is tricuspid. There is mild calcification of the aortic valve. Aortic valve regurgitation is not visualized. Aortic valve sclerosis/calcification is present, without any evidence of aortic stenosis.   7. The inferior vena cava is normal in size with greater than 50% respiratory variability, suggesting right atrial pressure of 3 mmHg.   TTE, 02/07/2021  1. Left ventricular ejection fraction, by estimation, is 55 to 60%. The left ventricle has normal function. The left ventricle has no regional wall motion abnormalities. The left ventricular internal cavity size was mildly dilated. Left ventricular diastolic parameters were normal.   2. Right ventricular systolic function is normal. The right ventricular size is normal. There is normal pulmonary artery systolic pressure.   3. The mitral valve is normal in structure. Mild mitral valve regurgitation.   4. The aortic valve is normal in structure. Aortic valve regurgitation is not visualized.   5. The inferior vena cava is normal in size with greater than 50% respiratory variability, suggesting right atrial pressure of 3 mmHg.   CT cardiac morph w pulm vein, 01/24/2020 1.  No LAA thrombus  2.  Moderate bi atrial enlargement  3.  Normal PV anatomy measurements above  4.  Dilated main pulmonary artery 4.1 cm  5.  No obvious PFO / ASD  6.  Normal aortic root 3.7 cm with aortic atherosclerosis  7.  Coronary calcium  score 562 which is 94 th percentile for  age/sex   ASSESSMENT AND PLAN:  #) elevated coronary calcium  score #) chest pressure #) DOE Unclear cause of patient's chest pressure and DOE. Will proceed with ischemic eval. With her AFib history, will order Pet CT to eval cors We discussed that if PET CT is abnormal, will proceed with LHC Reviewed ER precautions if symptoms worsen  #) SND s/p PPM PPM functioning well, see paceart for details  #) parox Afib Continues to have paroxysms of AFib No AAD options S/p AF ablation x 3 Continue 18.75mg  coreg  BID  #) Hypercoag d/t parox afib CHA2DS2-VASc Score = 6 [CHF History: 1, HTN History: 1, Diabetes History: 0, Stroke History: 2, Vascular Disease History: 0, Age Score: 1, Gender Score: 1].  Therefore, the patient's annual risk of stroke is 9.7 %. Stroke ppx - eliquis  5mg  BID, appropriately dosed No bleeding concerns  #) pre-op evaluation Planned for colonoscopy With patient's chest pressure and DOE, would defer procedure until after ischemic eval      Informed Consent   Shared Decision Making/Informed Consent The risks [chest pain, shortness of breath, cardiac arrhythmias, dizziness, blood pressure fluctuations, myocardial infarction, stroke/transient ischemic attack, nausea, vomiting, allergic  reaction, radiation exposure, metallic taste sensation and life-threatening complications (estimated to be 1 in 10,000)], benefits (risk stratification, diagnosing coronary artery disease, treatment guidance) and alternatives of a cardiac PET stress test were discussed in detail with Ms. Mantell and she agrees to proceed.       Current medicines are reviewed at length with the patient today.   The patient does not have concerns regarding her medicines.  The following changes were made today:   None   Labs/ tests ordered today include:  Orders Placed This Encounter  Procedures   NM PET CT CARDIAC PERFUSION MULTI W/ABSOLUTE BLOODFLOW   Cardiac Stress Test: Informed Consent Details:  Physician/Practitioner Attestation; Transcribe to consent form and obtain patient signature   EKG 12-Lead     Disposition: Follow up with Dr. Marven Slimmer or EP APP in 2 months      Signed, Vickye Astorino, NP  06/09/23  3:30 PM  Electrophysiology [facility]

## 2023-06-08 ENCOUNTER — Telehealth: Payer: Self-pay

## 2023-06-08 ENCOUNTER — Ambulatory Visit: Payer: Self-pay

## 2023-06-08 DIAGNOSIS — R0602 Shortness of breath: Secondary | ICD-10-CM

## 2023-06-08 MED ORDER — ALBUTEROL SULFATE HFA 108 (90 BASE) MCG/ACT IN AERS: 1.0000 | INHALATION_SPRAY | Freq: Four times a day (QID) | RESPIRATORY_TRACT | 2 refills | Status: DC | PRN

## 2023-06-08 NOTE — Addendum Note (Signed)
 Addended by: Dorothe Gaster on: 06/08/2023 01:25 PM   Modules accepted: Orders
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
## 2023-06-08 NOTE — Telephone Encounter (Signed)
 LAST APPOINTMENT DATE: 04/16/23   NEXT APPOINTMENT DATE: 06/18/2023  Albuterol  Inhaler   LAST REFILL: 12/23/2019  QTY: 8g 2RF

## 2023-06-08 NOTE — Telephone Encounter (Signed)
 Copied from CRM [id number]. Topic: Clinical - Medication Question >> Jun 08, 2023  9:41 AM Baldo Levan wrote: Reason for CRM: Patient is calling in again regarding the inhaler. Medication still shows pending and the patient is completely out. Med. refill request was sent in on 05/23, please advise patient if any issues or questions.

## 2023-06-09 ENCOUNTER — Encounter: Payer: Self-pay | Admitting: Cardiology

## 2023-06-09 ENCOUNTER — Ambulatory Visit: Attending: Cardiology | Admitting: Cardiology

## 2023-06-09 VITALS — BP 130/72 | HR 83 | Ht 67.0 in | Wt 194.6 lb

## 2023-06-09 DIAGNOSIS — Z950 Presence of cardiac pacemaker: Secondary | ICD-10-CM | POA: Diagnosis not present

## 2023-06-09 DIAGNOSIS — I480 Paroxysmal atrial fibrillation: Secondary | ICD-10-CM

## 2023-06-09 DIAGNOSIS — D6869 Other thrombophilia: Secondary | ICD-10-CM | POA: Diagnosis not present

## 2023-06-09 DIAGNOSIS — I495 Sick sinus syndrome: Secondary | ICD-10-CM | POA: Diagnosis not present

## 2023-06-09 DIAGNOSIS — R0789 Other chest pain: Secondary | ICD-10-CM

## 2023-06-09 LAB — CUP PACEART INCLINIC DEVICE CHECK
Date Time Interrogation Session: 20250603154535
Implantable Lead Connection Status: 753985
Implantable Lead Connection Status: 753985
Implantable Lead Implant Date: 20250303
Implantable Lead Implant Date: 20250303
Implantable Lead Location: 753859
Implantable Lead Location: 753860
Implantable Lead Model: 3830
Implantable Lead Model: 5076
Implantable Pulse Generator Implant Date: 20250303

## 2023-06-09 MED ORDER — LEVALBUTEROL TARTRATE 45 MCG/ACT IN AERO: 2.0000 | INHALATION_SPRAY | RESPIRATORY_TRACT | 12 refills | Status: DC | PRN

## 2023-06-09 NOTE — Addendum Note (Signed)
 Addended by: Dorothe Gaster on: 06/09/2023 08:56 AM   Modules accepted: Orders

## 2023-06-09 NOTE — Telephone Encounter (Signed)
 I called and spoke with the patient and ordered the xopenex 

## 2023-06-09 NOTE — Patient Instructions (Addendum)
 Medication Instructions:  Your Physician recommend you continue on your current medication as directed.    *If you need a refill on your cardiac medications before your next appointment, please call your pharmacy*  Lab Work: None ordered at this time  If you have labs (blood work) drawn today and your tests are completely normal, you will receive your results only by: MyChart Message (if you have MyChart) OR A paper copy in the mail If you have any lab test that is abnormal or we need to change your treatment, we will call you to review the results.  Testing/Procedures: Please report to Radiology at [facility] Main Entrance, medical mall, 30 mins prior to your test.  [address]  [place], [place]  How to Prepare for Your Cardiac PET/CT Stress Test:  Nothing to eat or drink, except water, 3 hours prior to arrival time.  NO caffeine/decaffeinated products, or chocolate 12 hours prior to arrival. (Please note decaffeinated beverages (teas/coffees) still contain caffeine).  If you have caffeine within 12 hours prior, the test will need to be rescheduled.  Medication instructions: Do not take erectile dysfunction medications for 72 hours prior to test (sildenafil, tadalafil) Do not take nitrates (isosorbide mononitrate, Ranexa) the day before or day of test Do not take tamsulosin the day before or morning of test Hold theophylline containing medications for 12 hours. Hold Dipyridamole 48 hours prior to the test.  NO perfume, cologne or lotion on chest or abdomen area. FEMALES - Please avoid wearing dresses to this appointment.  Total time is 1 to 2 hours; you may want to bring reading material for the waiting time.  IF YOU THINK YOU MAY BE PREGNANT, OR ARE NURSING PLEASE INFORM THE TECHNOLOGIST.  In preparation for your appointment, medication and supplies will be purchased.  Appointment availability is limited, so if you need to cancel or reschedule,  please call the Radiology Department Scheduler at [phone] 24 hours in advance to avoid a cancellation fee of $100.00  What to Expect When you Arrive:  Once you arrive and check in for your appointment, you will be taken to a preparation room within the Radiology Department.  A technologist or Nurse will obtain your medical history, verify that you are correctly prepped for the exam, and explain the procedure.  Afterwards, an IV will be started in your arm and electrodes will be placed on your skin for EKG monitoring during the stress portion of the exam. Then you will be escorted to the PET/CT scanner.  There, staff will get you positioned on the scanner and obtain a blood pressure and EKG.  During the exam, you will continue to be connected to the EKG and blood pressure machines.  A small, safe amount of a radioactive tracer will be injected in your IV to obtain a series of pictures of your heart along with an injection of a stress agent.    After your Exam:  It is recommended that you eat a meal and drink a caffeinated beverage to counter act any effects of the stress agent.  Drink plenty of fluids for the remainder of the day and urinate frequently for the first couple of hours after the exam.  Your doctor will inform you of your test results within 7-10 business days.  For more information and frequently asked questions, please visit our website: [email or web address]  For questions about your test or how to prepare for your test, please call: Cardiac Imaging Nurse Navigators Office: [phone]  Follow-Up: At [facility], you and your health needs are our priority.  As part of our continuing mission to provide you with exceptional heart care, our providers are all part of one team.  This team includes your primary Cardiologist (physician) and Advanced Practice Providers or APPs (Physician Assistants and Nurse Practitioners) who all work together to provide you with  the care you need, when you need it.  Your next appointment:   2 month(s) after PET-CT scan  Provider:   [name], NP

## 2023-06-11 ENCOUNTER — Telehealth: Payer: Self-pay

## 2023-06-11 ENCOUNTER — Other Ambulatory Visit: Payer: Self-pay

## 2023-06-11 DIAGNOSIS — R0602 Shortness of breath: Secondary | ICD-10-CM

## 2023-06-11 MED ORDER — LEVALBUTEROL TARTRATE 45 MCG/ACT IN AERO
2.0000 | INHALATION_SPRAY | RESPIRATORY_TRACT | 0 refills | Status: DC | PRN
Start: 2023-06-11 — End: 2023-06-16

## 2023-06-11 MED ORDER — LEVALBUTEROL TARTRATE 45 MCG/ACT IN AERO
2.0000 | INHALATION_SPRAY | RESPIRATORY_TRACT | 12 refills | Status: DC | PRN
Start: 2023-06-11 — End: 2023-06-11

## 2023-06-11 NOTE — Telephone Encounter (Signed)
 Returned pt call to confirm prescription that was previously sent in as a "phone in"

## 2023-06-11 NOTE — Telephone Encounter (Signed)
 Looks like the levalbuterol  was attempted to be e-scribed by PCP but when in as "phone in" and not "normal". Will resend prescription to local pharmacy now.

## 2023-06-11 NOTE — Telephone Encounter (Signed)
 Copied from CRM [id number]. Topic: Clinical - Prescription Issue >> Jun 11, 2023 10:32 AM [place] J wrote: Reason for CRM: Patient's levalbuterol  (XOPENEX  HFA) 45 MCG/ACT inhaler was not sent to any pharmacy. She has been without an inhaler for a week now. Her levalbuterol  needs to be sent to:   [id number] - [place], [place] - [address] AT [facility] [address] OF [address] [place] [address] Phone: [phone] Fax: [phone] Hours: Not open 24 hours  Patient said that she needs this done by today and that she cannot go without this over the weekend. She would like a MyChart message once this is authorized.

## 2023-06-11 NOTE — Addendum Note (Signed)
 Addended by: Bodin Gorka K on: 06/11/2023 04:30 PM   Modules accepted: Orders

## 2023-06-12 ENCOUNTER — Ambulatory Visit: Admitting: Behavioral Health

## 2023-06-12 ENCOUNTER — Encounter: Payer: Self-pay | Admitting: Behavioral Health

## 2023-06-12 DIAGNOSIS — F4323 Adjustment disorder with mixed anxiety and depressed mood: Secondary | ICD-10-CM | POA: Diagnosis not present
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
## 2023-06-13 ENCOUNTER — Ambulatory Visit: Payer: Self-pay | Admitting: Cardiology

## 2023-06-15 ENCOUNTER — Telehealth: Payer: Self-pay

## 2023-06-15 DIAGNOSIS — R0602 Shortness of breath: Secondary | ICD-10-CM

## 2023-06-15 NOTE — Telephone Encounter (Signed)
  Levalbuterol  was sent in on 06/12/23.

## 2023-06-16 ENCOUNTER — Encounter (HOSPITAL_COMMUNITY): Payer: Self-pay

## 2023-06-16 ENCOUNTER — Encounter (HOSPITAL_BASED_OUTPATIENT_CLINIC_OR_DEPARTMENT_OTHER): Payer: Self-pay

## 2023-06-16 ENCOUNTER — Ambulatory Visit (HOSPITAL_BASED_OUTPATIENT_CLINIC_OR_DEPARTMENT_OTHER): Admitting: Family Medicine

## 2023-06-16 MED ORDER — LEVALBUTEROL TARTRATE 45 MCG/ACT IN AERO: 2.0000 | INHALATION_SPRAY | RESPIRATORY_TRACT | 2 refills | Status: DC | PRN

## 2023-06-16 NOTE — Addendum Note (Signed)
 Addended by: Dorothe Gaster on: 06/16/2023 03:05 PM   Modules accepted: Orders

## 2023-06-17 ENCOUNTER — Other Ambulatory Visit (HOSPITAL_COMMUNITY)

## 2023-06-17 ENCOUNTER — Encounter (HOSPITAL_COMMUNITY)
Admission: RE | Admit: 2023-06-17 | Discharge: 2023-06-17 | Disposition: A | Discharge status: Home / Self Care | Source: Ambulatory Visit | Attending: Cardiology | Admitting: Cardiology

## 2023-06-17 DIAGNOSIS — R0789 Other chest pain: Secondary | ICD-10-CM | POA: Diagnosis not present

## 2023-06-17 DIAGNOSIS — I480 Paroxysmal atrial fibrillation: Secondary | ICD-10-CM | POA: Insufficient documentation

## 2023-06-17 LAB — NM PET CT CARDIAC PERFUSION MULTI W/ABSOLUTE BLOODFLOW
LV dias vol: 162 mL (ref 46–106)
LV sys vol: 101 mL
MBFR: 1.77
Nuc Rest EF: 38 %
Nuc Stress EF: 45 %
Rest MBF: 0.9 ml/g/min
Rest Nuclear Isotope Dose: 22.8 mCi
ST Depression (mm): 0 mm
Stress MBF: 1.59 ml/g/min
Stress Nuclear Isotope Dose: 22.9 mCi

## 2023-06-17 MED ORDER — REGADENOSON 0.4 MG/5ML IV SOLN
0.4000 mg | Freq: Once | INTRAVENOUS | Status: AC
  Administered 2023-06-17: 0.4 mg via INTRAVENOUS

## 2023-06-17 MED ORDER — RUBIDIUM RB82 GENERATOR (RUBYFILL)
22.8400 | PACK | Freq: Once | INTRAVENOUS | Status: AC
Start: 2023-06-17 — End: 2023-06-17
  Administered 2023-06-17: 22.84 via INTRAVENOUS

## 2023-06-17 MED ORDER — RUBIDIUM RB82 GENERATOR (RUBYFILL)
22.8500 | PACK | Freq: Once | INTRAVENOUS | Status: AC
  Administered 2023-06-17: 22.85 via INTRAVENOUS

## 2023-06-17 MED ORDER — RUBIDIUM RB82 GENERATOR (RUBYFILL): 22.8500 | PACK | Freq: Once | INTRAVENOUS | Status: DC

## 2023-06-17 MED ORDER — REGADENOSON 0.4 MG/5ML IV SOLN
INTRAVENOUS | Status: AC
  Filled 2023-06-17: qty 5

## 2023-06-17 NOTE — Progress Notes (Signed)
 Pt. Tolerated lexi scan well.

## 2023-06-18 ENCOUNTER — Ambulatory Visit: Admitting: Nurse Practitioner

## 2023-06-19 ENCOUNTER — Ambulatory Visit (INDEPENDENT_AMBULATORY_CARE_PROVIDER_SITE_OTHER): Admitting: Behavioral Health

## 2023-06-19 ENCOUNTER — Encounter: Payer: Self-pay | Admitting: Behavioral Health

## 2023-06-19 DIAGNOSIS — F4323 Adjustment disorder with mixed anxiety and depressed mood: Secondary | ICD-10-CM

## 2023-06-19 NOTE — Progress Notes (Signed)
 [facility] Counselor/Therapist Progress Note  Patient ID: Shannon [name], MRN: [id number],    Date:,  June 19, 2023  Time Spent: 50 minutes, 3:00pm until 350 PM this session was held via video teletherapy. The patient consented to the video teletherapy and was located in her home during this session. She is aware it is the responsibility of the patient to secure confidentiality on her end of the session. The provider was in a private home office for the duration of this session.     Reported Symptoms: Anxiety, depression  Mental Status Exam: Appearance:  Well Groomed     Behavior: Appropriate  Motor: Normal  Speech/Language:  Clear and Coherent  Affect: Appropriate for  Mood: normal  Thought process: normal  Thought content:   WNL  Sensory/Perceptual disturbances:   WNL  Orientation: oriented to person, place, time/date, situation, day of week, month of year, and year  Attention: Good  Concentration: Good  Memory: WNL  Fund of knowledge:  Good  Insight:   Good  Judgment:  Good  Impulse Control: Good   Risk Assessment: Danger to Self:  No Self-injurious Behavior: No Danger to Others: No Duty to Warn:no Physical Aggression / Violence:No  Access to Firearms a concern: No  Gang Involvement:No   Subjective: There have been several positives over the past week.  She and her family went to see the [facility] bananas and had a great time.  They were able to part close enough that it was not so difficult for her to walk.  For the most part they have everything in the new house and he made substantial progress in getting things unpacked and set up.  They have contract on the house and he put down a substantial good-faith payment so they feel fairly confident this will go through.  Things have been fairly quiet in terms of relationships and she is thankful for that.  She is sleeping pretty well.  She is enjoying time with her children and grandchildren.  Her health is  one of her biggest concerns now she has difficulty breathing if she walks very far or is up very long or does anything for an extended amount of time.  She is hoping looking at some medication changes will help alleviate that.  She continues to use coping skills and remain positive.  She does contract for safety having no thoughts of hurting herself or anyone else. Interventions: Cognitive Behavioral Therapy  Diagnosis: Adjustment disorder with mixed anxiety and depressed mood.  Plan: I will meet with the patient every 2 weeks via video session TX. Plan:To use cognitive behavioral therapy principles as well as elements of dialectical behavior therapy.  Goals are to reduce anxiety and depression by at least 50% with a target date of November 06, 2022.  Goals are to have less sadness as indicated by PHQ-9 scores as well as patient report.  We also work on improving mood and return to a healthier level of functioning as defined by her goals for being happy, identify causes and process triggers for depressed mood.  We will use cognitive behavioral therapy to explore and replace unhealthy thoughts and behavior patterns contributing to depression.  I will continue to encourage shearing of feelings related to the causes and symptoms of depression as well as teach and encouraged use of coping skills for management of depressive symptoms.  We also will work to improve the patient's ability to manage anxiety symptoms and better handle stress, identify causes for anxiety  and explore ways for reduction of anxiety in addition to resolving conflicts contributing to anxiety and managing thoughts and worrisome thinking is contributing to anxiety.  Interventions will include providing education about anxiety, facilitate problem solution skills, teaching coping skills for managing anxiety such as grounding exercises, progressive muscle relaxation and cognitive re framing etc.  We will also use cognitive behavioral therapy to  identify and change anxiety provoking thoughts and behavior patterns as well as using DBT distress tolerance and mindfulness skills. I reviewed the treatment plan goals with the patient who agreed to continue with goals as stated above. Progress: 35%New target date will be October 31st, 2025. Cecile Coder, [facility]                                                Cecile Coder, [facility]               Cecile Coder, [facility]               Cecile Coder, [facility]               Cecile Coder, [facility]               Cecile Coder, [facility]               Cecile Coder, [facility]

## 2023-06-22 ENCOUNTER — Other Ambulatory Visit (HOSPITAL_COMMUNITY): Payer: Self-pay | Admitting: Cardiology

## 2023-06-22 ENCOUNTER — Other Ambulatory Visit: Payer: Self-pay | Admitting: Nurse Practitioner

## 2023-06-22 DIAGNOSIS — I639 Cerebral infarction, unspecified: Secondary | ICD-10-CM

## 2023-06-22 DIAGNOSIS — F411 Generalized anxiety disorder: Secondary | ICD-10-CM

## 2023-06-23 ENCOUNTER — Other Ambulatory Visit: Payer: Self-pay

## 2023-06-23 ENCOUNTER — Telehealth: Payer: Self-pay | Admitting: Cardiology

## 2023-06-23 DIAGNOSIS — I428 Other cardiomyopathies: Secondary | ICD-10-CM

## 2023-06-23 MED ORDER — LOSARTAN POTASSIUM 25 MG PO TABS: 25.0000 mg | ORAL_TABLET | Freq: Every day | ORAL | 3 refills | Status: DC

## 2023-06-23 NOTE — Telephone Encounter (Signed)
 I returned patient's call, reviewed imaging results.

## 2023-06-23 NOTE — Telephone Encounter (Signed)
 RX list updated.   Labs ordered for repeat.  HF referral ordered (advised to schedule with pharm team as well)

## 2023-06-23 NOTE — Telephone Encounter (Signed)
 Spoke to patient she stated she was returning a call to Lyle San NP.Advised message was sent to wrong triage pool.I will send message to her.

## 2023-06-23 NOTE — Addendum Note (Signed)
 Addended by: Saher Davee T on: 06/23/2023 01:26 PM   Modules accepted: Orders

## 2023-06-23 NOTE — Telephone Encounter (Signed)
 Prescription refill request for Eliquis  received. Indication:afib Last office visit:6/25 Scr:0.61  3/25 Age: 73 Weight:88.3  kg  Prescription refilled

## 2023-06-23 NOTE — Telephone Encounter (Signed)
 Patient is returning call for results. Please advise

## 2023-06-25 MED ORDER — BUSPIRONE HCL 5 MG PO TABS: 5.0000 mg | ORAL_TABLET | Freq: Two times a day (BID) | ORAL | 0 refills | Status: DC

## 2023-06-25 NOTE — Addendum Note (Signed)
 Addended by: Dorothe Gaster on: 06/25/2023 10:02 AM   Modules accepted: Orders

## 2023-06-26 ENCOUNTER — Encounter: Payer: Self-pay | Admitting: Behavioral Health

## 2023-06-26 ENCOUNTER — Ambulatory Visit: Admitting: Behavioral Health

## 2023-06-26 DIAGNOSIS — F4323 Adjustment disorder with mixed anxiety and depressed mood: Secondary | ICD-10-CM | POA: Diagnosis not present

## 2023-06-26 NOTE — Progress Notes (Signed)
 [facility] Counselor/Therapist Progress Note  Patient ID: Shannon [name], MRN: [id number],    Date:,  June 26, 2023  Time Spent: 56 minutes, 3:00pm until 356 PM this session was held via video teletherapy. The patient consented to the video teletherapy and was located in her home during this session. She is aware it is the responsibility of the patient to secure confidentiality on her end of the session. The provider was in a private home office for the duration of this session.     Reported Symptoms: Anxiety, depression  Mental Status Exam: Appearance:  Well Groomed     Behavior: Appropriate  Motor: Normal  Speech/Language:  Clear and Coherent  Affect: Appropriate for  Mood: normal  Thought process: normal  Thought content:   WNL  Sensory/Perceptual disturbances:   WNL  Orientation: oriented to person, place, time/date, situation, day of week, month of year, and year  Attention: Good  Concentration: Good  Memory: WNL  Fund of knowledge:  Good  Insight:   Good  Judgment:  Good  Impulse Control: Good   Risk Assessment: Danger to Self:  No Self-injurious Behavior: No Danger to Others: No Duty to Warn:no Physical Aggression / Violence:No  Access to Firearms a concern: No  Gang Involvement:No   Subjective: The patient did get results of her test back and reports that they were not as good as she hoped.  They said that her heart has been a weekend and I think a lot of it is contributing to the A-fib events that she has had.  They are looking at changing some medications as a way of stabilizing up but says they cannot reverse what is already been done to her heart.  There is no specific timeline but they are looking at changing a couple of meds and then following up in a few weeks to see what the next step will be.  She acknowledges a flood of feelings related to her heart.  She also is dealing with anger specifically directed at her husband because it was  because of his choices that she started into A-fib.  She recognizes that is not making anything better so we processed her anger while validating the right to feel that way but also started talking about acceptance of where she is now and how to move forward in a healthier way mentally emotionally and physically.  She is very thankful to be able to live with her daughter and grandson and spent time with her son and his family also.  She will be able to file for divorce and a little bit over a month and so that will be the last door that she closes on the relationship which she feels will bring her some closure.  She also was relying on her faith to sustain her through this time. She does contract for safety having no thoughts of hurting herself or anyone else. Interventions: Cognitive Behavioral Therapy  Diagnosis: Adjustment disorder with mixed anxiety and depressed mood.  Plan: I will meet with the patient every 2 weeks via video session TX. Plan:To use cognitive behavioral therapy principles as well as elements of dialectical behavior therapy.  Goals are to reduce anxiety and depression by at least 50% with a target date of November 06, 2022.  Goals are to have less sadness as indicated by PHQ-9 scores as well as patient report.  We also work on improving mood and return to a healthier level of functioning as defined by her goals  for being happy, identify causes and process triggers for depressed mood.  We will use cognitive behavioral therapy to explore and replace unhealthy thoughts and behavior patterns contributing to depression.  I will continue to encourage shearing of feelings related to the causes and symptoms of depression as well as teach and encouraged use of coping skills for management of depressive symptoms.  We also will work to improve the patient's ability to manage anxiety symptoms and better handle stress, identify causes for anxiety and explore ways for reduction of anxiety in addition  to resolving conflicts contributing to anxiety and managing thoughts and worrisome thinking is contributing to anxiety.  Interventions will include providing education about anxiety, facilitate problem solution skills, teaching coping skills for managing anxiety such as grounding exercises, progressive muscle relaxation and cognitive re framing etc.  We will also use cognitive behavioral therapy to identify and change anxiety provoking thoughts and behavior patterns as well as using DBT distress tolerance and mindfulness skills. I reviewed the treatment plan goals with the patient who agreed to continue with goals as stated above. Progress: 35%New target date will be October 31st, 2025. Cecile Coder, [facility]                                                Cecile Coder, [facility]               Cecile Coder, [facility]               Cecile Coder, [facility]               Cecile Coder, [facility]               Cecile Coder, [facility]               Cecile Coder, [facility]               Cecile Coder, [facility]

## 2023-06-30 ENCOUNTER — Other Ambulatory Visit

## 2023-06-30 ENCOUNTER — Telehealth: Admitting: Physician Assistant

## 2023-06-30 DIAGNOSIS — J069 Acute upper respiratory infection, unspecified: Secondary | ICD-10-CM

## 2023-06-30 DIAGNOSIS — B9689 Other specified bacterial agents as the cause of diseases classified elsewhere: Secondary | ICD-10-CM

## 2023-06-30 MED ORDER — BENZONATATE 100 MG PO CAPS: 100.0000 mg | ORAL_CAPSULE | Freq: Three times a day (TID) | ORAL | 0 refills | Status: DC | PRN

## 2023-06-30 MED ORDER — DOXYCYCLINE HYCLATE 100 MG PO TABS: 100.0000 mg | ORAL_TABLET | Freq: Two times a day (BID) | ORAL | 0 refills | Status: DC

## 2023-06-30 NOTE — Progress Notes (Signed)
 Virtual Visit Consent   Shannon [name], you are scheduled for a virtual visit with a [facility] provider today. Just as with appointments in the office, your consent must be obtained to participate. Your consent will be active for this visit and any virtual visit you may have with one of our providers in the next 365 days. If you have a MyChart account, a copy of this consent can be sent to you electronically.  As this is a virtual visit, video technology does not allow for your provider to perform a traditional examination. This may limit your provider's ability to fully assess your condition. If your provider identifies any concerns that need to be evaluated in person or the need to arrange testing (such as labs, EKG, etc.), we will make arrangements to do so. Although advances in technology are sophisticated, we cannot ensure that it will always work on either your end or our end. If the connection with a video visit is poor, the visit may have to be switched to a telephone visit. With either a video or telephone visit, we are not always able to ensure that we have a secure connection.  By engaging in this virtual visit, you consent to the provision of healthcare and authorize for your insurance to be billed (if applicable) for the services provided during this visit. Depending on your insurance coverage, you may receive a charge related to this service.  I need to obtain your verbal consent now. Are you willing to proceed with your visit today? Shannon [name] has provided verbal consent on 06/30/2023 for a virtual visit (video or telephone). Shannon [name], [place]  Date: 06/30/2023 3:59 PM   Virtual Visit via Video Note   I, Shannon [name], connected with  Avigail Pilling  ([id number], 16-Feb-1950) on 06/30/23 at  4:00 PM EDT by a video-enabled telemedicine application and verified that I am speaking with the correct person using two identifiers.  Location: Patient:  Virtual Visit Location Patient: Home Provider: Virtual Visit Location Provider: Home Office   I discussed the limitations of evaluation and management by telemedicine and the availability of in person appointments. The patient expressed understanding and agreed to proceed.    History of Present Illness: Shannon [name] is a 73 y.o. who identifies as a female who was assigned female at birth, and is being seen today for URI symptoms starting over the past 6-7 days with nasal congestion, sinus pressure now moved into her chest with increased congestion and cough that is productive of phlegm. Denies fever but notes some SOB from excessive coughing. Has a pulse ox at home and O2 averaging 91-95%.   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/07/2023   Demand ischemia (HCC) 03/07/2023   PND (post-nasal drip) 12/18/2022   Sinus pressure 12/18/2022   Obesity (BMI 30-39.9) 11/07/2022   Foot swelling 05/19/2022   Hospital discharge follow-up 03/14/2022   Current severe episode of major depressive disorder without psychotic features without prior episode (HCC) 03/06/2022   GAD (generalized anxiety disorder) 03/06/2022   Caregiver role strain 03/06/2022   Headache, unspecified headache type 03/06/2022   Exposure to COVID-19 virus 03/06/2022   COVID-19 03/06/2022   Adjustment disorder with mixed anxiety and depressed  mood 03/03/2022   Diplopia 01/15/2022   History of CVA (cerebrovascular accident) 01/15/2022   Paroxysmal atrial fibrillation (HCC) 12/13/2021   Thoracic spine pain 11/22/2021   Tinnitus of both ears 11/22/2021   Bilateral lower extremity edema 11/22/2021   Acute CVA (cerebrovascular accident) (HCC) 02/06/2021   Preventative  health care 07/15/2020   Dyslipidemia, goal LDL below 130 07/10/2020   Iron  deficiency anemia 07/10/2020   Shortness of breath 12/23/2019   Asthma 12/23/2019   Morbid obesity (HCC) 09/11/2017   Chronic fatigue 05/27/2016   A-fib (HCC) 03/25/2016   Typical atrial flutter (HCC)    Chronic diastolic CHF (congestive heart failure) (HCC) 12/08/2015   Thoracic compression fracture (HCC) 11/16/2015   Orthostatic hypotension 11/15/2015   Osteopenia 09/12/2015   Vitamin D  deficiency disease 08/29/2015   Polymyalgia rheumatica (HCC) 04/18/2015   OSA (obstructive sleep apnea) 03/04/2015   Myalgia 11/22/2014   Primary hypertension 08/21/2014   Anxiety and depression 08/21/2014    Allergies:  Allergies  Allergen Reactions   Sulfur Nausea And Vomiting   Ace Inhibitors Cough   Amiodarone  Other (See Comments)    Intolerance multiple side effects   Hctz [Hydrochlorothiazide ] Other (See Comments)    Caused drug-induced LUPUS   Oxycodone Other (See Comments)    Hallucinations   Prednisone  Other (See Comments)    Made patient very aggressive   Sulfa Antibiotics Nausea And Vomiting   Voltaren [Diclofenac Sodium] Other (See Comments)    Made patient become aggressive   Medications:  Current Outpatient Medications:    acetaminophen  (TYLENOL ) 500 MG tablet, Take 1,000 mg by mouth every 6 (six) hours as needed for moderate pain or headache., Disp: , Rfl:    busPIRone  (BUSPAR ) 5 MG tablet, Take 1 tablet (5 mg total) by mouth 2 (two) times daily., Disp: 200 tablet, Rfl: 0   carvedilol  (COREG ) 6.25 MG tablet, Take 3 tablets (18.75 mg total) by mouth 2 (two) times daily with a meal., Disp: 540 tablet, Rfl: 3   Cholecalciferol (VITAMIN D ) 50 MCG (2000 UT) CAPS, Take 2,000 Units by mouth in the morning., Disp: , Rfl:    ELIQUIS  5 MG TABS tablet, TAKE 1 TABLET BY MOUTH TWICE  DAILY, Disp: 200 tablet, Rfl: 2   furosemide  (LASIX ) 20 MG tablet, TAKE 1 TABLET BY MOUTH DAILY, Disp: 100 tablet, Rfl: 3    Hydrocortisone (CORTIZONE-10 EX), Apply 1 application  topically as needed (skin irritation/itching)., Disp: , Rfl:    levalbuterol  (XOPENEX  HFA) 45 MCG/ACT inhaler, Inhale 2 puffs into the lungs every 4 (four) hours as needed for wheezing., Disp: 1 each, Rfl: 2   loratadine (CLARITIN) 10 MG tablet, Take 10 mg by mouth daily as needed for allergies., Disp: , Rfl:    losartan  (COZAAR ) 25 MG tablet, Take 1 tablet (25 mg total) by mouth daily., Disp: 90 tablet, Rfl: 3   Multiple Vitamin (MULTIVITAMIN) tablet, Take 1 tablet by mouth daily., Disp: , Rfl:    potassium chloride  20 MEQ TBCR, Take 1 tablet (20 mEq total) by mouth daily. (Patient taking differently: Take 40 mEq by mouth daily.), Disp: 90 tablet, Rfl: 3   simvastatin  (ZOCOR ) 20 MG tablet, TAKE 1 TABLET BY MOUTH DAILY, Disp: 100 tablet, Rfl: 2  Observations/Objective: Patient is well-developed, well-nourished in no acute distress.  Resting comfortably at home.  Head is normocephalic, atraumatic.  No labored breathing.  Speech is clear and coherent with logical content.  Patient is alert and oriented at baseline.   Assessment and Plan: 1.  Bacterial URI (Primary)  Supportive measures and OTC medications reviewed.  Start Doxycycline  and Tessalon  per orders. Continue Xopenex . Continue Pulse oximetry at home. If any worsening symptoms, or O2 < 90, needs in person evaluation ASAP.   Follow Up Instructions: I discussed the assessment and treatment plan with the patient. The patient was provided an opportunity to ask questions and all were answered. The patient agreed with the plan and demonstrated an understanding of the instructions.  A copy of instructions were sent to the patient via MyChart unless otherwise noted below.  The patient was advised to call back or seek an in-person evaluation if the symptoms worsen or if the condition fails to improve as anticipated.    Shannon Velma Lunger, PA-C

## 2023-06-30 NOTE — Patient Instructions (Signed)
 Shannon [name], thank you for joining [name], PA-C for today's virtual visit.  While this provider is not your primary care provider (PCP), if your PCP is located in our provider database this encounter information will be shared with them immediately following your visit.   A [facility] MyChart account gives you access to today's visit and all your visits, tests, and labs performed at [facility]  click here if you don't have a [facility] MyChart account or go to [email or web address]  Consent: (Patient) Shannon [name] provided verbal consent for this virtual visit at the beginning of the encounter.  Current Medications:  Current Outpatient Medications:    acetaminophen  (TYLENOL ) 500 MG tablet, Take 1,000 mg by mouth every 6 (six) hours as needed for moderate pain or headache., Disp: , Rfl:    busPIRone  (BUSPAR ) 5 MG tablet, Take 1 tablet (5 mg total) by mouth 2 (two) times daily., Disp: 200 tablet, Rfl: 0   carvedilol  (COREG ) 6.25 MG tablet, Take 3 tablets (18.75 mg total) by mouth 2 (two) times daily with a meal., Disp: 540 tablet, Rfl: 3   Cholecalciferol (VITAMIN D ) 50 MCG (2000 UT) CAPS, Take 2,000 Units by mouth in the morning., Disp: , Rfl:    ELIQUIS  5 MG TABS tablet, TAKE 1 TABLET BY MOUTH TWICE  DAILY, Disp: 200 tablet, Rfl: 2   furosemide  (LASIX ) 20 MG tablet, TAKE 1 TABLET BY MOUTH DAILY, Disp: 100 tablet, Rfl: 3   Hydrocortisone (CORTIZONE-10 EX), Apply 1 application  topically as needed (skin irritation/itching)., Disp: , Rfl:    levalbuterol  (XOPENEX  HFA) 45 MCG/ACT inhaler, Inhale 2 puffs into the lungs every 4 (four) hours as needed for wheezing., Disp: 1 each, Rfl: 2   loratadine (CLARITIN) 10 MG tablet, Take 10 mg by mouth daily as needed for allergies., Disp: , Rfl:    losartan  (COZAAR ) 25 MG tablet, Take 1 tablet (25 mg total) by mouth daily., Disp: 90 tablet, Rfl: 3   Multiple Vitamin (MULTIVITAMIN) tablet, Take 1 tablet by  mouth daily., Disp: , Rfl:    potassium chloride  20 MEQ TBCR, Take 1 tablet (20 mEq total) by mouth daily. (Patient taking differently: Take 40 mEq by mouth daily.), Disp: 90 tablet, Rfl: 3   simvastatin  (ZOCOR ) 20 MG tablet, TAKE 1 TABLET BY MOUTH DAILY, Disp: 100 tablet, Rfl: 2   Medications ordered in this encounter:  No orders of the defined types were placed in this encounter.    *If you need refills on other medications prior to your next appointment, please contact your pharmacy*  Follow-Up: Call back or seek an in-person evaluation if the symptoms worsen or if the condition fails to improve as anticipated.  [facility] [phone]  Other Instructions Please hydrate and rest. Continue your regular medications. Start the Doxycycline  (antibiotic) and Tessalon  (cough medication). You can use Delsym OTC as well.  Continue your Xopenex  and monitoring your O2 levels. If you note any non-resolving, new, or worsening symptoms despite treatment, or if O2 < 90 please seek an in-person evaluation ASAP.    If you have been instructed to have an in-person evaluation today at a local Urgent Care facility, please use the link below. It will take you to a list of all of our available [facility], including address, phone number and hours of operation. Please do not delay care.  [facility]  If you or a family member do not have a primary care  provider, use the link below to schedule a visit and establish care. When you choose a [place] primary care physician or advanced practice provider, you gain a long-term partner in health. Find a Primary Care Provider  Learn more about [facility]'s in-office and virtual care options: [place] - Get Care Now

## 2023-07-01 ENCOUNTER — Other Ambulatory Visit: Payer: Self-pay

## 2023-07-01 ENCOUNTER — Emergency Department (HOSPITAL_BASED_OUTPATIENT_CLINIC_OR_DEPARTMENT_OTHER)

## 2023-07-01 ENCOUNTER — Emergency Department (HOSPITAL_BASED_OUTPATIENT_CLINIC_OR_DEPARTMENT_OTHER)
Admission: EM | Admit: 2023-07-01 | Discharge: 2023-07-01 | Disposition: A | Discharge status: Home / Self Care | Attending: Emergency Medicine | Admitting: Emergency Medicine

## 2023-07-01 ENCOUNTER — Emergency Department (HOSPITAL_BASED_OUTPATIENT_CLINIC_OR_DEPARTMENT_OTHER): Admitting: Radiology

## 2023-07-01 DIAGNOSIS — R591 Generalized enlarged lymph nodes: Secondary | ICD-10-CM | POA: Diagnosis not present

## 2023-07-01 DIAGNOSIS — D72829 Elevated white blood cell count, unspecified: Secondary | ICD-10-CM | POA: Insufficient documentation

## 2023-07-01 DIAGNOSIS — I4891 Unspecified atrial fibrillation: Secondary | ICD-10-CM | POA: Insufficient documentation

## 2023-07-01 DIAGNOSIS — R918 Other nonspecific abnormal finding of lung field: Secondary | ICD-10-CM | POA: Diagnosis not present

## 2023-07-01 DIAGNOSIS — Z79899 Other long term (current) drug therapy: Secondary | ICD-10-CM | POA: Insufficient documentation

## 2023-07-01 DIAGNOSIS — I700 Atherosclerosis of aorta: Secondary | ICD-10-CM | POA: Diagnosis not present

## 2023-07-01 DIAGNOSIS — I509 Heart failure, unspecified: Secondary | ICD-10-CM | POA: Insufficient documentation

## 2023-07-01 DIAGNOSIS — J40 Bronchitis, not specified as acute or chronic: Secondary | ICD-10-CM | POA: Diagnosis not present

## 2023-07-01 DIAGNOSIS — Z950 Presence of cardiac pacemaker: Secondary | ICD-10-CM | POA: Diagnosis not present

## 2023-07-01 DIAGNOSIS — B9789 Other viral agents as the cause of diseases classified elsewhere: Secondary | ICD-10-CM | POA: Diagnosis not present

## 2023-07-01 DIAGNOSIS — R0602 Shortness of breath: Secondary | ICD-10-CM | POA: Diagnosis not present

## 2023-07-01 DIAGNOSIS — J90 Pleural effusion, not elsewhere classified: Secondary | ICD-10-CM | POA: Diagnosis not present

## 2023-07-01 DIAGNOSIS — J069 Acute upper respiratory infection, unspecified: Secondary | ICD-10-CM | POA: Diagnosis not present

## 2023-07-01 DIAGNOSIS — R059 Cough, unspecified: Secondary | ICD-10-CM | POA: Diagnosis present

## 2023-07-01 DIAGNOSIS — J984 Other disorders of lung: Secondary | ICD-10-CM | POA: Diagnosis not present

## 2023-07-01 DIAGNOSIS — Z7901 Long term (current) use of anticoagulants: Secondary | ICD-10-CM | POA: Diagnosis not present

## 2023-07-01 LAB — COMPREHENSIVE METABOLIC PANEL WITH GFR
ALT: 13 U/L (ref 0–44)
AST: 19 U/L (ref 15–41)
Albumin: 3.8 g/dL (ref 3.5–5.0)
Alkaline Phosphatase: 93 U/L (ref 38–126)
Anion gap: 7 (ref 5–15)
BUN: 11 mg/dL (ref 8–23)
CO2: 30 mmol/L (ref 22–32)
Calcium: 9.5 mg/dL (ref 8.9–10.3)
Chloride: 99 mmol/L (ref 98–111)
Creatinine, Ser: 0.66 mg/dL (ref 0.44–1.00)
GFR, Estimated: >60 mL/min (ref >60)
Glucose, Bld: 94 mg/dL (ref 70–99)
Potassium: 3.8 mmol/L (ref 3.5–5.1)
Sodium: 136 mmol/L (ref 135–145)
Total Bilirubin: 0.6 mg/dL (ref 0.0–1.2)
Total Protein: 7.1 g/dL (ref 6.5–8.1)

## 2023-07-01 LAB — CBC WITH DIFFERENTIAL/PLATELET
Abs Immature Granulocytes: 0.02 10*3/uL (ref 0.00–0.07)
Basophils Absolute: 0.1 10*3/uL (ref 0.0–0.1)
Basophils Relative: 0 %
Eosinophils Absolute: 0.3 10*3/uL (ref 0.0–0.5)
Eosinophils Relative: 3 %
HCT: 33.4 % — ABNORMAL LOW (ref 36.0–46.0)
Hemoglobin: 10.9 g/dL — ABNORMAL LOW (ref 12.0–15.0)
Immature Granulocytes: 0 %
Lymphocytes Relative: 14 %
Lymphs Abs: 1.5 10*3/uL (ref 0.7–4.0)
MCH: 29.6 pg (ref 26.0–34.0)
MCHC: 32.6 g/dL (ref 30.0–36.0)
MCV: 90.8 fL (ref 80.0–100.0)
Monocytes Absolute: 1.3 10*3/uL — ABNORMAL HIGH (ref 0.1–1.0)
Monocytes Relative: 12 %
Neutro Abs: 7.9 10*3/uL — ABNORMAL HIGH (ref 1.7–7.7)
Neutrophils Relative %: 71 %
Platelets: 271 10*3/uL (ref 150–400)
RBC: 3.68 MIL/uL — ABNORMAL LOW (ref 3.87–5.11)
RDW: 13.5 % (ref 11.5–15.5)
WBC: 11.1 10*3/uL — ABNORMAL HIGH (ref 4.0–10.5)
nRBC: 0 % (ref 0.0–0.2)

## 2023-07-01 LAB — PRO BRAIN NATRIURETIC PEPTIDE: Pro Brain Natriuretic Peptide: 3772 pg/mL — ABNORMAL HIGH (ref <300.0)

## 2023-07-01 LAB — RESP PANEL BY RT-PCR (RSV, FLU A&B, COVID)  RVPGX2
Influenza A by PCR: NEGATIVE
Influenza B by PCR: NEGATIVE
Resp Syncytial Virus by PCR: NEGATIVE
SARS Coronavirus 2 by RT PCR: NEGATIVE

## 2023-07-01 LAB — TROPONIN T, HIGH SENSITIVITY: Troponin T High Sensitivity: 16 ng/L (ref <19)

## 2023-07-01 MED ORDER — IPRATROPIUM-ALBUTEROL 0.5-2.5 (3) MG/3ML IN SOLN
3.0000 mL | Freq: Once | RESPIRATORY_TRACT | Status: AC
  Administered 2023-07-01: 3 mL via RESPIRATORY_TRACT
  Filled 2023-07-01: qty 3

## 2023-07-01 MED ORDER — METHYLPREDNISOLONE SODIUM SUCC 125 MG IJ SOLR
125.0000 mg | INTRAMUSCULAR | Status: AC
  Administered 2023-07-01: 125 mg via INTRAVENOUS
  Filled 2023-07-01: qty 2

## 2023-07-01 MED ORDER — METHYLPREDNISOLONE 4 MG PO TBPK: ORAL_TABLET | Freq: Every day | ORAL | 0 refills | Status: DC

## 2023-07-01 MED ORDER — IOHEXOL 350 MG/ML SOLN
75.0000 mL | Freq: Once | INTRAVENOUS | Status: AC | PRN
  Administered 2023-07-01: 75 mL via INTRAVENOUS

## 2023-07-01 MED ORDER — MAGNESIUM SULFATE 2 GM/50ML IV SOLN
2.0000 g | Freq: Once | INTRAVENOUS | Status: AC
  Administered 2023-07-01: 2 g via INTRAVENOUS
  Filled 2023-07-01: qty 50

## 2023-07-01 NOTE — ED Triage Notes (Signed)
 Patient states shortness of breath for the past week that worsened today. Had video visit yesterday and was prescribed doxycycline  and tessalon . Patient states taking without relief.

## 2023-07-01 NOTE — ED Notes (Signed)
 RT Note: Patient oxygen saturation on room air while at rest= 98%, HR 69, RR 16 Patient oxygen saturation on room air while ambulating + 96%, HR 82, RR18  Patient tolerated walk well

## 2023-07-01 NOTE — ED Notes (Signed)
 RT note: Patient states she has a tight chest feeling and may need a breathing treatment. She also stated her oxygen saturation dropped at home below 88% and scared her.   Currently RA =93%

## 2023-07-01 NOTE — ED Provider Notes (Signed)
 [place] EMERGENCY DEPARTMENT AT [facility] Provider Note   CSN: [id number] Arrival date & time: 07/01/23  1155     Patient presents with: Shortness of Breath   Shannon [name] is a 73 y.o. female pacemaker, A-fib on Eliquis , CHF presents emerged from today for evaluation of cough and cold symptoms since 06-25-2023.  Patient reports that she was taking care of her daughter and grandchild with similar symptoms and later on became sick.  She did have some coughing with chest tightness but no chest pain.  Reports has been productive with gray phlegm but no hemoptysis.  Denies any fevers or chills or bodyaches.  Reports initial sore throat but has since resolved.  No abdominal pain, nausea, vomiting.  Is having some rhinorrhea and nasal dressing with postnasal drip.  Reports that she feels like her coughing is worse at night.  And she is not having multiple coughing fits.  Has been trying some albuterol  inhaler and nasal spray before going to bed.  That is likely what is causing her coughing.  She reports compliance with her medications and denies any wrist dose.  Was seen during telemedicine visit and was prescribed doxycycline  and Tessalon  Perles.  She has had 2 doses but decided to present to the ER.  Shortness of Breath Associated symptoms: cough   Associated symptoms: no abdominal pain, no chest pain, no fever and no vomiting        Prior to Admission medications   Medication Sig Start Date End Date Taking? Authorizing Provider  methylPREDNISolone (MEDROL DOSEPAK) 4 MG TBPK tablet Take by mouth daily for 6 days. Taper over 6 days 07/01/23 07/07/23 Yes Bernis Ernst, PA-C  acetaminophen  (TYLENOL ) 500 MG tablet Take 1,000 mg by mouth every 6 (six) hours as needed for moderate pain or headache.    [provider]  benzonatate  (TESSALON ) 100 MG capsule Take 1 capsule (100 mg total) by mouth 3 (three) times daily as needed for cough. 06/30/23   [name], PA-C   busPIRone  (BUSPAR ) 5 MG tablet Take 1 tablet (5 mg total) by mouth 2 (two) times daily. 06/25/23   [name], NP  carvedilol  (COREG ) 6.25 MG tablet Take 3 tablets (18.75 mg total) by mouth 2 (two) times daily with a meal. 05/29/23   [name], MD  Cholecalciferol (VITAMIN D ) 50 MCG (2000 UT) CAPS Take 2,000 Units by mouth in the morning.    [provider]  doxycycline  (VIBRA -TABS) 100 MG tablet Take 1 tablet (100 mg total) by mouth 2 (two) times daily. 06/30/23   [name], PA-C  ELIQUIS  5 MG TABS tablet TAKE 1 TABLET BY MOUTH TWICE  DAILY 06/23/23   [name], MD  furosemide  (LASIX ) 20 MG tablet TAKE 1 TABLET BY MOUTH DAILY 03/05/23   [name], MD  Hydrocortisone (CORTIZONE-10 EX) Apply 1 application  topically as needed (skin irritation/itching).    [provider]  levalbuterol  (XOPENEX  HFA) 45 MCG/ACT inhaler Inhale 2 puffs into the lungs every 4 (four) hours as needed for wheezing. 06/16/23   [name], NP  loratadine (CLARITIN) 10 MG tablet Take 10 mg by mouth daily as needed for allergies.    [provider]  losartan  (COZAAR ) 25 MG tablet Take 1 tablet (25 mg total) by mouth daily. 06/23/23 09/21/23  Riddle, [name], NP  Multiple Vitamin (MULTIVITAMIN) tablet Take 1 tablet by mouth daily.    [provider]  potassium chloride  20 MEQ TBCR  Take 1 tablet (20 mEq total) by mouth daily. Patient taking differently: Take 40 mEq by mouth daily. 02/04/23   Riddle, [name], NP  simvastatin  (ZOCOR ) 20 MG tablet TAKE 1 TABLET BY MOUTH DAILY 05/14/23   [name], NP    Allergies: Sulfur, Ace inhibitors, Amiodarone , Hctz [hydrochlorothiazide ], Oxycodone, Prednisone , Sulfa antibiotics, and Voltaren [diclofenac sodium]    Review of Systems  Constitutional:  Negative for chills and fever.  HENT:  Positive for congestion, postnasal drip and rhinorrhea.   Respiratory:  Positive for cough and shortness of breath.    Cardiovascular:  Negative for chest pain and leg swelling.  Gastrointestinal:  Negative for abdominal pain, constipation, diarrhea, nausea and vomiting.  Neurological:  Negative for light-headedness.    Updated Vital Signs BP (!) 151/81   Pulse 62   Temp 98.9 F (37.2 C) (Oral)   Resp 16   SpO2 94%   Physical Exam Vitals and nursing note reviewed.  Constitutional:      General: She is not in acute distress.    Appearance: She is not ill-appearing or toxic-appearing.  HENT:     Right Ear: Tympanic membrane, ear canal and external ear normal.     Left Ear: Tympanic membrane, ear canal and external ear normal.     Nose:     Comments: Nasal congestion with bilateral nasal turbinate edema with scant clear nasal discharge.  Eyes:     Comments: Uvula midline, airway patent.  No pharyngeal erythema, edema, exudate noted.  Moist mucous membranes   Cardiovascular:     Rate and Rhythm: Normal rate.  Pulmonary:     Effort: Pulmonary effort is normal.     Breath sounds: Decreased breath sounds present.     Comments: Diminished breath sounds throughout however patient satting well on room air without increased work of breathing and speaking in full sentences with ease. Abdominal:     Palpations: Abdomen is soft.     Tenderness: There is no abdominal tenderness.   Musculoskeletal:     Cervical back: Normal range of motion.     Right lower leg: No tenderness. No edema.     Left lower leg: No tenderness. No edema.   Skin:    General: Skin is warm and dry.   Neurological:     Mental Status: She is alert.     (all labs ordered are listed, but only abnormal results are displayed) Labs Reviewed  CBC WITH DIFFERENTIAL/PLATELET - Abnormal; Notable for the following components:      Result Value   WBC 11.1 (*)    RBC 3.68 (*)    Hemoglobin 10.9 (*)    HCT 33.4 (*)    Neutro Abs 7.9 (*)    Monocytes Absolute 1.3 (*)    All other components within normal limits  PRO BRAIN  NATRIURETIC PEPTIDE - Abnormal; Notable for the following components:   Pro Brain Natriuretic Peptide 3,772.0 (*)    All other components within normal limits  RESP PANEL BY RT-PCR (RSV, FLU A&B, COVID)  [id number]  COMPREHENSIVE METABOLIC PANEL WITH GFR  TROPONIN T, HIGH SENSITIVITY    EKG: EKG Interpretation Date/Time:  Wednesday July 01 2023 14:46:25 EDT Ventricular Rate:  64 PR Interval:  157 QRS Duration:  87 QT Interval:  444 QTC Calculation: 459 R Axis:   91  Text Interpretation: Sinus rhythm Atrial premature complex Confirmed by Ruthe Cornet [id number]) on 07/01/2023 5:24:55 PM  Radiology: CT Angio Chest PE W and/or Wo Contrast Result  Date: 07/01/2023 CLINICAL DATA:  High probability for PE EXAM: CT ANGIOGRAPHY CHEST WITH CONTRAST TECHNIQUE: Multidetector CT imaging of the chest was performed using the standard protocol during bolus administration of intravenous contrast. Multiplanar CT image reconstructions and MIPs were obtained to evaluate the vascular anatomy. RADIATION DOSE REDUCTION: This exam was performed according to the departmental dose-optimization program which includes automated exposure control, adjustment of the mA and/or kV according to patient size and/or use of iterative reconstruction technique. CONTRAST:  75mL OMNIPAQUE  IOHEXOL  350 MG/ML SOLN COMPARISON:  Chest CT 12/25/2022 FINDINGS: Cardiovascular: The heart is mildly enlarged. Aorta is normal in size. Left-sided pacemaker is present. There is no pericardial effusion. There are atherosclerotic calcifications of the aorta and coronary arteries. There is adequate opacification of the pulmonary arteries to the segmental level. There is no evidence for pulmonary embolism. Mediastinum/Nodes: There are nonenlarged and enlarged mediastinal lymph nodes diffusely. These measure up to 12 mm in the paratracheal region, 2.1 cm in the subcarinal region, 12 mm in the right hilar region, and 12 mm in the left hilar region. Visualized  esophagus and thyroid  gland are within normal limits. Lungs/Pleura: There is a trace right pleural effusion. There is some central peribronchial wall thickening bilaterally. There is minimal air trapping in the right lung base. Upper Abdomen: No acute abnormality. Musculoskeletal: Chronic T12 compression deformity is unchanged. Review of the MIP images confirms the above findings. IMPRESSION: 1. No evidence for pulmonary embolism. 2. Mild cardiomegaly. 3. Trace right pleural effusion. 4. Central peribronchial wall thickening bilaterally may be due to bronchitis. 5. Mediastinal and hilar lymphadenopathy. This may be reactive, but neoplasm is not excluded. 6. Air trapping in the right lung base can be seen with small airways disease. Aortic Atherosclerosis (ICD10-I70.0). Electronically Signed   By: Greig Pique M.D.   On: 07/01/2023 18:05   DG Chest 2 View Result Date: 07/01/2023 CLINICAL DATA:  Shortness of breath. EXAM: CHEST - 2 VIEW COMPARISON:  Chest radiograph dated 03/10/2023. FINDINGS: No focal consolidation, pleural effusion or pneumothorax. The cardiac silhouette is within limits. Atherosclerotic calcification of the aorta. Left pectoral pacemaker device. No acute osseous pathology. IMPRESSION: No active cardiopulmonary disease. Electronically Signed   By: Vanetta Chou M.D.   On: 07/01/2023 14:08   Procedures   Medications Ordered in the ED  ipratropium-albuterol  (DUONEB) 0.5-2.5 (3) MG/3ML nebulizer solution 3 mL (3 mLs Nebulization Given 07/01/23 1211)  ipratropium-albuterol  (DUONEB) 0.5-2.5 (3) MG/3ML nebulizer solution 3 mL (3 mLs Nebulization Given 07/01/23 1526)  magnesium  sulfate IVPB 2 g 50 mL (0 g Intravenous Stopped 07/01/23 1730)  methylPREDNISolone sodium succinate (SOLU-MEDROL) 125 mg/2 mL injection 125 mg (125 mg Intravenous Given 07/01/23 1611)  iohexol  (OMNIPAQUE ) 350 MG/ML injection 75 mL (75 mLs Intravenous Contrast Given 07/01/23 1748)    Medical Decision Making Amount  and/or Complexity of Data Reviewed Radiology: ordered.  Risk Prescription drug management.   73 y.o. female presents to the ER for evaluation of cough. Differential diagnosis includes but is not limited to Upper respiratory infection, lower respiratory infection, allergies/irritants, asthma, reflux, CHF, interstitial lung disease, foreign body, ACE inhibitors. Vital signs mildly elevated blood pressure otherwise unremarkable. Physical exam as noted above.   Patient had telemedicine appointment was given Tessalon  Perles and doxycycline .  She has mildly elevated blood pressure but is hemodynamically stable.  She was given breathing treatment prior to my arrival and reports that she was feeling much better after this.  She reports compliancy to her medications.  Will order labs and  imaging.  Recent family member sick with similar symptoms.  I see that she has a allergy to prednisone  but reports that she was only having anger after being on the medication for 9 months.  We discussed short-term use of the medication and had shared decision making.  Patient agreeable to dosing here.  Will give her Solu-Medrol and magnesium  to help with bronchospasm.  I independently reviewed and interpreted the patient's labs.  CBC shows mild leukocytosis 11.1 with a left shift.  Hemoglobin at 10.9.  CMP without abnormality.  Troponin at 16.  COVID, flu, RSV negative.  proBNP is elevated at 3772.  proBNP is elevated however patient does not look to be volume overloaded.  Will continue with CTA to rule out any PE, occult pneumonia, or edema/effusion.  CXR shows No active cardiopulmonary disease.  CTA 1. No evidence for pulmonary embolism. 2. Mild cardiomegaly. 3. Trace right pleural effusion. 4. Central peribronchial wall thickening bilaterally may be due to bronchitis. 5. Mediastinal and hilar lymphadenopathy. This may be reactive, but neoplasm is not excluded. 6. Air trapping in the right lung base can be seen with  small airways disease. Per radiologist's interpretation.    I have ordered additional DuoNeb on the patient.  They have ambulate the patient who reports that she felt fine with walking and her low saturation was at 96%.  She overall is sounding better listening to her lungs.  This is likely a viral illness.  She has some bronchitis seen on CTA.  Does have small pleural effusion.  Will recommend taking her Lasix  x 2 for the next 3 days to help with this.  Discussed with her to watch her blood pressure when doing so.  We discussed about short-term use of prednisone  to help with the bronchitis which she is agreeable to.  Will send her in Medrol Dosepak.  Advised her to continue take the medications given to her by her telehealth visit.  We discussed using her albuterol  inhaler as needed as well.  Information given on the lymphadenopathy and recommended follow-up with PCP.  Stable for discharge home with close outpatient follow-up and strict return precautions.  We discussed the results of the labs/imaging. The plan is take medication as prescribed, follow-up PCP, strict return precautions. We discussed strict return precautions and red flag symptoms. The patient verbalized their understanding and agrees to the plan. The patient is stable and being discharged home in good condition.  I discussed this case with my attending physician who cosigned this note including patient's presenting symptoms, physical exam, and planned diagnostics and interventions. Attending physician stated agreement with plan or made changes to plan which were implemented.   Portions of this report may have been transcribed using voice recognition software. Every effort was made to ensure accuracy; however, inadvertent computerized transcription errors may be present.    Final diagnoses:  Bronchitis  Viral upper respiratory infection    ED Discharge Orders          Ordered    methylPREDNISolone (MEDROL DOSEPAK) 4 MG TBPK tablet   Daily        07/01/23 1835               Bernis Ernst, PA-C 07/01/23 2002    [address] DO 07/01/23 2003

## 2023-07-01 NOTE — Discharge Instructions (Addendum)
 You were seen in the ER today for evaluation of your symptoms.  Your CT shows that you do have some bronchitis/inflammation of your lungs.  This is likely from a viral illness.  You do have some fluid seen, recommending you to increase your Lasix  instead of taking 1 pill take 2 pills for the next 3 days.  If this changes and drops your blood pressure too much however, go back to your normal regimen.  I am sending you in a Medrol Dosepak which you will take as prescribed over the next 6 days.  The information will be on the medication on how to take it.  Additionally, you have some enlarged lymph nodes in your chest, could be from the bronchitis however you need to follow-up with this with your primary care provider as it may need additional follow-up.  You can continue the medications given to you by your telemedicine and do not continue using the albuterol  inhaler.  I have included additional information for you to review in the discharge paperwork.  If you have any concerns, new or worsening symptoms, please return to your nearest emergency department for reevaluation.  Contact a doctor if: Your symptoms do not get better in 2 weeks. You have trouble coughing up the mucus. Your cough keeps you awake at night. You have a fever. Get help right away if: You cough up blood. You have chest pain. You have very bad shortness of breath. You faint or keep feeling like you are going to faint. You have a very bad headache. Your fever or chills get worse. These symptoms may be an emergency. Get help right away. Call your local emergency services (911 in the [place] Do not wait to see if the symptoms will go away. Do not drive yourself to the hospital.

## 2023-07-01 NOTE — ED Notes (Signed)
 Pt d/c instructions, medications, and follow-up care reviewed with pt and family. Pt and family verbalized understanding and had no further questions at time of d/c. Pt CA&Ox4 and in NAD at time of d/c

## 2023-07-03 ENCOUNTER — Encounter: Payer: Self-pay | Admitting: Behavioral Health

## 2023-07-03 ENCOUNTER — Ambulatory Visit (INDEPENDENT_AMBULATORY_CARE_PROVIDER_SITE_OTHER): Admitting: Behavioral Health

## 2023-07-03 DIAGNOSIS — F411 Generalized anxiety disorder: Secondary | ICD-10-CM

## 2023-07-03 NOTE — Progress Notes (Signed)
  Behavioral Health Counselor/Therapist Progress Note  Patient ID: Shannon [name], MRN: [id number],    Date:,  July 03, 2023  Time Spent: 58 minutes, 3:00pm until 358 PM this session was held via video teletherapy. The patient consented to the video teletherapy and was located in her home during this session. She is aware it is the responsibility of the patient to secure confidentiality on her end of the session. The provider was in a private home office for the duration of this session.     Reported Symptoms: Anxiety, depression  Mental Status Exam: Appearance:  Well Groomed     Behavior: Appropriate  Motor: Normal  Speech/Language:  Clear and Coherent  Affect: Appropriate for  Mood: normal  Thought process: normal  Thought content:   WNL  Sensory/Perceptual disturbances:   WNL  Orientation: oriented to person, place, time/date, situation, day of week, month of year, and year  Attention: Good  Concentration: Good  Memory: WNL  Fund of knowledge:  Good  Insight:   Good  Judgment:  Good  Impulse Control: Good   Risk Assessment: Danger to Self:  No Self-injurious Behavior: No Danger to Others: No Duty to Warn:no Physical Aggression / Violence:No  Access to Firearms a concern: No  Gang Involvement:No   Subjective: The patient felt that she had been improving but said the beginning of the week she started feeling worse.  She did not sleep for 3 nights because of coughing so strenuously.  When her oxygen level dropped into the low 80s she knew it was time to go to the hospital so she did it was given 3 breathing treatments as well a medication.  She was given medication for cough as well as infection as she was diagnosed with bronchitis.  The medication is a medication that works with her heart issues and she is starting to see improvement but coughed continually throughout the session.  She knows that she has to continue to rest to let her body heels.  It is  frustrating for her to see all that needs to be done but her daughter and grandson have also not been well so encouraged her as well as her family to rest over the weekend.  She said that both her brother and her son told her that she needed to learn to let go of the past we talked about what type feels like to her.  We talked about using cognitive behavioral therapy to reframe and challenge thoughts that are creating anxiety and irritability for her and how she can replace it with a more positive present and future focus.  She does contract for safety having no thoughts of hurting herself or anyone else. Interventions: Cognitive Behavioral Therapy  Diagnosis: Adjustment disorder with mixed anxiety and depressed mood.  Plan: I will meet with the patient every 2 weeks via video session [place]. Plan:To use cognitive behavioral therapy principles as well as elements of dialectical behavior therapy.  Goals are to reduce anxiety and depression by at least 50% with a target date of November 06, 2022.  Goals are to have less sadness as indicated by PHQ-9 scores as well as patient report.  We also work on improving mood and return to a healthier level of functioning as defined by her goals for being happy, identify causes and process triggers for depressed mood.  We will use cognitive behavioral therapy to explore and replace unhealthy thoughts and behavior patterns contributing to depression.  I will continue to encourage shearing  of feelings related to the causes and symptoms of depression as well as teach and encouraged use of coping skills for management of depressive symptoms.  We also will work to improve the patient's ability to manage anxiety symptoms and better handle stress, identify causes for anxiety and explore ways for reduction of anxiety in addition to resolving conflicts contributing to anxiety and managing thoughts and worrisome thinking is contributing to anxiety.  Interventions will include  providing education about anxiety, facilitate problem solution skills, teaching coping skills for managing anxiety such as grounding exercises, progressive muscle relaxation and cognitive re framing etc.  We will also use cognitive behavioral therapy to identify and change anxiety provoking thoughts and behavior patterns as well as using DBT distress tolerance and mindfulness skills. I reviewed the treatment plan goals with the patient who agreed to continue with goals as stated above. Progress: 35%New target date will be October 31st, 2025. Lorrene CHRISTELLA Hasten, [facility]                                                Lorrene CHRISTELLA Hasten, [facility]               Lorrene CHRISTELLA Hasten, [facility]               Lorrene CHRISTELLA Hasten, [facility]               Lorrene CHRISTELLA Hasten, [facility]               Lorrene CHRISTELLA Hasten, [facility]               Lorrene CHRISTELLA Hasten, [facility]               Lorrene CHRISTELLA Hasten, [facility]               Lorrene CHRISTELLA Hasten, [facility]

## 2023-07-06 ENCOUNTER — Encounter (HOSPITAL_COMMUNITY): Payer: Self-pay | Admitting: Emergency Medicine

## 2023-07-06 ENCOUNTER — Other Ambulatory Visit: Payer: Self-pay

## 2023-07-06 ENCOUNTER — Emergency Department (HOSPITAL_COMMUNITY)

## 2023-07-06 ENCOUNTER — Inpatient Hospital Stay (HOSPITAL_COMMUNITY)
Admission: EM | Admit: 2023-07-06 | Discharge: 2023-07-09 | DRG: 309 | Disposition: A | Discharge status: Home / Self Care | Attending: Internal Medicine | Admitting: Internal Medicine

## 2023-07-06 ENCOUNTER — Other Ambulatory Visit (HOSPITAL_COMMUNITY): Payer: Self-pay

## 2023-07-06 DIAGNOSIS — Z7901 Long term (current) use of anticoagulants: Secondary | ICD-10-CM | POA: Diagnosis not present

## 2023-07-06 DIAGNOSIS — I4891 Unspecified atrial fibrillation: Secondary | ICD-10-CM

## 2023-07-06 DIAGNOSIS — T508X5A Adverse effect of diagnostic agents, initial encounter: Secondary | ICD-10-CM | POA: Diagnosis not present

## 2023-07-06 DIAGNOSIS — I2510 Atherosclerotic heart disease of native coronary artery without angina pectoris: Secondary | ICD-10-CM | POA: Diagnosis not present

## 2023-07-06 DIAGNOSIS — J45909 Unspecified asthma, uncomplicated: Secondary | ICD-10-CM | POA: Diagnosis present

## 2023-07-06 DIAGNOSIS — I480 Paroxysmal atrial fibrillation: Secondary | ICD-10-CM

## 2023-07-06 DIAGNOSIS — I5032 Chronic diastolic (congestive) heart failure: Secondary | ICD-10-CM | POA: Diagnosis present

## 2023-07-06 DIAGNOSIS — R404 Transient alteration of awareness: Secondary | ICD-10-CM | POA: Diagnosis not present

## 2023-07-06 DIAGNOSIS — I428 Other cardiomyopathies: Secondary | ICD-10-CM | POA: Diagnosis not present

## 2023-07-06 DIAGNOSIS — J4540 Moderate persistent asthma, uncomplicated: Secondary | ICD-10-CM | POA: Diagnosis not present

## 2023-07-06 DIAGNOSIS — M320 Drug-induced systemic lupus erythematosus: Secondary | ICD-10-CM | POA: Diagnosis present

## 2023-07-06 DIAGNOSIS — I5021 Acute systolic (congestive) heart failure: Secondary | ICD-10-CM | POA: Diagnosis not present

## 2023-07-06 DIAGNOSIS — I4819 Other persistent atrial fibrillation: Secondary | ICD-10-CM | POA: Diagnosis not present

## 2023-07-06 DIAGNOSIS — Z1152 Encounter for screening for COVID-19: Secondary | ICD-10-CM

## 2023-07-06 DIAGNOSIS — T502X5S Adverse effect of carbonic-anhydrase inhibitors, benzothiadiazides and other diuretics, sequela: Secondary | ICD-10-CM

## 2023-07-06 DIAGNOSIS — Z79899 Other long term (current) drug therapy: Secondary | ICD-10-CM

## 2023-07-06 DIAGNOSIS — G4733 Obstructive sleep apnea (adult) (pediatric): Secondary | ICD-10-CM | POA: Diagnosis present

## 2023-07-06 DIAGNOSIS — F411 Generalized anxiety disorder: Secondary | ICD-10-CM | POA: Diagnosis present

## 2023-07-06 DIAGNOSIS — I110 Hypertensive heart disease with heart failure: Secondary | ICD-10-CM | POA: Diagnosis not present

## 2023-07-06 DIAGNOSIS — I495 Sick sinus syndrome: Secondary | ICD-10-CM | POA: Diagnosis not present

## 2023-07-06 DIAGNOSIS — I951 Orthostatic hypotension: Secondary | ICD-10-CM | POA: Diagnosis present

## 2023-07-06 DIAGNOSIS — Z5986 Financial insecurity: Secondary | ICD-10-CM | POA: Diagnosis not present

## 2023-07-06 DIAGNOSIS — Z8673 Personal history of transient ischemic attack (TIA), and cerebral infarction without residual deficits: Secondary | ICD-10-CM | POA: Diagnosis not present

## 2023-07-06 DIAGNOSIS — R000 Tachycardia, unspecified: Secondary | ICD-10-CM | POA: Diagnosis not present

## 2023-07-06 DIAGNOSIS — I5022 Chronic systolic (congestive) heart failure: Secondary | ICD-10-CM | POA: Diagnosis not present

## 2023-07-06 DIAGNOSIS — E785 Hyperlipidemia, unspecified: Secondary | ICD-10-CM | POA: Diagnosis present

## 2023-07-06 DIAGNOSIS — I700 Atherosclerosis of aorta: Secondary | ICD-10-CM | POA: Diagnosis not present

## 2023-07-06 DIAGNOSIS — Z950 Presence of cardiac pacemaker: Secondary | ICD-10-CM

## 2023-07-06 DIAGNOSIS — R002 Palpitations: Secondary | ICD-10-CM | POA: Diagnosis not present

## 2023-07-06 DIAGNOSIS — I499 Cardiac arrhythmia, unspecified: Secondary | ICD-10-CM | POA: Diagnosis not present

## 2023-07-06 DIAGNOSIS — Z87891 Personal history of nicotine dependence: Secondary | ICD-10-CM

## 2023-07-06 HISTORY — DX: Unspecified atrial fibrillation: I48.91

## 2023-07-06 LAB — BASIC METABOLIC PANEL WITH GFR
Anion gap: 13 (ref 5–15)
BUN: 18 mg/dL (ref 8–23)
CO2: 26 mmol/L (ref 22–32)
Calcium: 9 mg/dL (ref 8.9–10.3)
Chloride: 98 mmol/L (ref 98–111)
Creatinine, Ser: 0.67 mg/dL (ref 0.44–1.00)
GFR, Estimated: >60 mL/min (ref >60)
Glucose, Bld: 114 mg/dL — ABNORMAL HIGH (ref 70–99)
Potassium: 4.7 mmol/L (ref 3.5–5.1)
Sodium: 137 mmol/L (ref 135–145)

## 2023-07-06 LAB — CBC
HCT: 37.6 % (ref 36.0–46.0)
Hemoglobin: 12.1 g/dL (ref 12.0–15.0)
MCH: 28.9 pg (ref 26.0–34.0)
MCHC: 32.2 g/dL (ref 30.0–36.0)
MCV: 90 fL (ref 80.0–100.0)
Platelets: 421 10*3/uL — ABNORMAL HIGH (ref 150–400)
RBC: 4.18 MIL/uL (ref 3.87–5.11)
RDW: 13.7 % (ref 11.5–15.5)
WBC: 13.8 10*3/uL — ABNORMAL HIGH (ref 4.0–10.5)
nRBC: 0 % (ref 0.0–0.2)

## 2023-07-06 LAB — TROPONIN I (HIGH SENSITIVITY)
Troponin I (High Sensitivity): 44 ng/L — ABNORMAL HIGH (ref <18)
Troponin I (High Sensitivity): 50 ng/L — ABNORMAL HIGH (ref <18)

## 2023-07-06 MED ORDER — DILTIAZEM HCL 25 MG/5ML IV SOLN
10.0000 mg | Freq: Once | INTRAVENOUS | Status: AC
  Administered 2023-07-06: 10 mg via INTRAVENOUS
  Filled 2023-07-06: qty 5

## 2023-07-06 MED ORDER — APIXABAN 5 MG PO TABS
5.0000 mg | ORAL_TABLET | Freq: Two times a day (BID) | ORAL | Status: DC
  Administered 2023-07-07 – 2023-07-09 (×6): 5 mg via ORAL
  Filled 2023-07-06 (×6): qty 1

## 2023-07-06 MED ORDER — SIMVASTATIN 20 MG PO TABS
20.0000 mg | ORAL_TABLET | Freq: Every day | ORAL | Status: DC
  Administered 2023-07-07 – 2023-07-09 (×3): 20 mg via ORAL
  Filled 2023-07-06 (×3): qty 1

## 2023-07-06 MED ORDER — ACETAMINOPHEN 650 MG RE SUPP: 650.0000 mg | Freq: Four times a day (QID) | RECTAL | Status: DC | PRN

## 2023-07-06 MED ORDER — SODIUM CHLORIDE 0.9% FLUSH
3.0000 mL | Freq: Two times a day (BID) | INTRAVENOUS | Status: DC
  Administered 2023-07-07 – 2023-07-09 (×4): 3 mL via INTRAVENOUS

## 2023-07-06 MED ORDER — DILTIAZEM HCL-DEXTROSE 125-5 MG/125ML-% IV SOLN (PREMIX)
5.0000 mg/h | INTRAVENOUS | Status: DC
  Administered 2023-07-06 – 2023-07-07 (×2): 5 mg/h via INTRAVENOUS
  Filled 2023-07-06: qty 125

## 2023-07-06 MED ORDER — FUROSEMIDE 20 MG PO TABS
20.0000 mg | ORAL_TABLET | Freq: Every day | ORAL | Status: DC
  Administered 2023-07-07 – 2023-07-09 (×3): 20 mg via ORAL
  Filled 2023-07-06 (×3): qty 1

## 2023-07-06 MED ORDER — LOSARTAN POTASSIUM 25 MG PO TABS
25.0000 mg | ORAL_TABLET | Freq: Every day | ORAL | Status: DC
  Administered 2023-07-07 – 2023-07-09 (×3): 25 mg via ORAL
  Filled 2023-07-06 (×3): qty 1

## 2023-07-06 MED ORDER — METOPROLOL TARTRATE 25 MG PO TABS
25.0000 mg | ORAL_TABLET | Freq: Four times a day (QID) | ORAL | Status: DC
  Administered 2023-07-07 (×3): 25 mg via ORAL
  Filled 2023-07-06 (×3): qty 1

## 2023-07-06 MED ORDER — HYDROCODONE-ACETAMINOPHEN 5-325 MG PO TABS: 1.0000 | ORAL_TABLET | ORAL | Status: DC | PRN

## 2023-07-06 MED ORDER — ETOMIDATE 2 MG/ML IV SOLN
8.0000 mg | Freq: Once | INTRAVENOUS | Status: AC
  Administered 2023-07-06: 8 mg via INTRAVENOUS

## 2023-07-06 MED ORDER — BUSPIRONE HCL 5 MG PO TABS
5.0000 mg | ORAL_TABLET | Freq: Two times a day (BID) | ORAL | Status: DC
  Administered 2023-07-07 – 2023-07-09 (×6): 5 mg via ORAL
  Filled 2023-07-06 (×6): qty 1

## 2023-07-06 MED ORDER — ACETAMINOPHEN 325 MG PO TABS: 650.0000 mg | ORAL_TABLET | Freq: Four times a day (QID) | ORAL | Status: DC | PRN

## 2023-07-06 MED ORDER — DILTIAZEM LOAD VIA INFUSION: 10.0000 mg | Freq: Once | INTRAVENOUS | Status: DC

## 2023-07-06 MED ORDER — SENNOSIDES-DOCUSATE SODIUM 8.6-50 MG PO TABS: 1.0000 | ORAL_TABLET | Freq: Every evening | ORAL | Status: DC | PRN

## 2023-07-06 NOTE — ED Notes (Signed)
 Procedural consent form signed and placed in medical records drawer.

## 2023-07-06 NOTE — Sedation Documentation (Signed)
Pt shocked at 150J.

## 2023-07-06 NOTE — Progress Notes (Signed)
 RT NOTE: RT on standby for cardioversion procedure.

## 2023-07-06 NOTE — H&P (Incomplete)
 History and Physical    Shannon [name] FMW:[id number] DOB: [birth year] DOA: 07/06/2023  PCP: Knute [name], FNP   Patient coming from: Home   Chief Complaint: Heart is fluttering, lightheadedness   HPI: Shannon [name] is a 74 y.o. female with medical history significant for OSA on CPAP, anxiety, hypertension, and atrial fibrillation on Eliquis  who is status post ablation x 3, sinus node dysfunction with pacemaker, and low EF recently identified on PET/CT who presents with lightheadedness and sensation of her heart fluttering.  Patient reports that she had been experiencing upper respiratory symptoms for the past 1-1/2 weeks, was started on doxycycline  on 06/30/2023 and then steroids the following day.  These upper respiratory symptoms have improved significantly but today she had acute onset of a sensation of her heart fluttering.  She has had some lightheadedness associated with this and  ED Course: Upon arrival to the ED, patient is found to be ***  Review of Systems:  All other systems reviewed and apart from HPI, are negative.  Past Medical History:  Diagnosis Date  . Allergy   . Anemia   . Anxiety   . Arthritis    neck and lower back (03/25/2016)  . Asthma 1990s X 1   short term inhaler use   . CAD (coronary artery disease)   . Cardiac pacemaker in situ    MDT  . Chronic lower back pain   . Degenerative disorder of bone   . Depression   . Diastolic dysfunction   . Drug-induced lupus erythematosus    HCTZ induced; still gettin over it (03/25/2016)  . GERD (gastroesophageal reflux disease)   . Herniated disc, cervical   . Hyperlipidemia   . Hypertension   . Neuromuscular disorder (HCC)    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 HTN  . Orthostatic hypotension   . OSA on CPAP   . Osteopenia   . PAF (paroxysmal atrial fibrillation) (HCC)   . Pinched nerve in neck   . Sinus node dysfunction (HCC)   . Sleep apnea    wears CPAP  .  T12 compression fracture (HCC) 11/2015  . Vitamin D  deficiency   . Whiplash injury 06/07/2010    Past Surgical History:  Procedure Laterality Date  . APPENDECTOMY  1990s  . ATRIAL FIBRILLATION ABLATION N/A 03/25/2016   Procedure: Atrial Fibrillation Ablation;  Surgeon: Lynwood Rakers, MD;  Location: [facility] INVASIVE CV LAB;  Service: Cardiovascular;  Laterality: N/A;  . ATRIAL FIBRILLATION ABLATION N/A 01/31/2020   Procedure: ATRIAL FIBRILLATION ABLATION;  Surgeon: Rakers Lynwood, MD;  Location: [facility] INVASIVE CV LAB;  Service: Cardiovascular;  Laterality: N/A;  . ATRIAL FIBRILLATION ABLATION N/A 12/13/2021   Procedure: ATRIAL FIBRILLATION ABLATION;  Surgeon: [name], MD;  Location: [facility] INVASIVE CV LAB;  Service: Cardiovascular;  Laterality: N/A;  . COLONOSCOPY    . FOREARM FRACTURE SURGERY Left ~ 02/2011   broke arm; shattered wrist  . FOREARM HARDWARE REMOVAL Left ~ 07/2011  . implantable loop recorder placement  03/07/2019   Medtronic Reveal Linq model LNQ 22 implantable loop recorder ([id number]) implanted by Dr Rakers for Afib management  . LOOP RECORDER REMOVAL N/A 03/09/2023   Procedure: LOOP RECORDER REMOVAL;  Surgeon: Kennyth Chew, MD;  Location: [facility] INVASIVE CV LAB;  Service: Cardiovascular;  Laterality: N/A;  . PACEMAKER IMPLANT N/A 03/09/2023   Procedure: PACEMAKER IMPLANT;  Surgeon: Kennyth Chew, MD;  Location: [facility] INVASIVE CV LAB;  Service: Cardiovascular;  Laterality: N/A;  . Spinal  Nerve Ablation    . TEE WITHOUT CARDIOVERSION N/A 03/24/2016   Procedure: TRANSESOPHAGEAL ECHOCARDIOGRAM (TEE);  Surgeon: Jerel Balding, MD;  Location: [facility] ENDOSCOPY;  Service: Cardiovascular;  Laterality: N/A;    Social History:   reports that she quit smoking about 9 years ago. Her smoking use included cigarettes and e-cigarettes. She started smoking about 48 years ago. She has a 22 pack-year smoking history. She has never used smokeless tobacco. She reports that she does not currently use alcohol.  She reports that she does not use drugs.  Allergies  Allergen Reactions  . Sulfur Nausea And Vomiting  . Ace Inhibitors Cough  . Amiodarone  Other (See Comments)    Intolerance multiple side effects  . Hctz [Hydrochlorothiazide ] Other (See Comments)    Caused drug-induced LUPUS  . Oxycodone Other (See Comments)    Hallucinations  . Prednisone  Other (See Comments)    Made patient very aggressive  . Sulfa Antibiotics Nausea And Vomiting  . Voltaren [Diclofenac Sodium] Other (See Comments)    Made patient become aggressive    Family History  Problem Relation Age of Onset  . Lung cancer Mother   . Stroke Father   . Hypertension Father   . Heart disease Father   . Hypertension Maternal Grandmother   . Stroke Maternal Grandfather   . Heart disease Maternal Grandfather   . Diabetes Paternal Grandmother   . Heart disease Paternal Grandmother   . Diabetes Paternal Grandfather   . Bipolar disorder Daughter   . Other Daughter        fatty liver  . Other Son        fattye liver, born with 1 kidney  . Colon cancer Neg Hx   . Esophageal cancer Neg Hx   . Rectal cancer Neg Hx   . Stomach cancer Neg Hx      Prior to Admission medications   Medication Sig Start Date End Date Taking? Authorizing Provider  acetaminophen  (TYLENOL ) 500 MG tablet Take 1,000 mg by mouth every 6 (six) hours as needed for moderate pain or headache.    [provider]  benzonatate  (TESSALON ) 100 MG capsule Take 1 capsule (100 mg total) by mouth 3 (three) times daily as needed for cough. 06/30/23   [name], PA-C  busPIRone  (BUSPAR ) 5 MG tablet Take 1 tablet (5 mg total) by mouth 2 (two) times daily. 06/25/23   [name], NP  carvedilol  (COREG ) 6.25 MG tablet Take 3 tablets (18.75 mg total) by mouth 2 (two) times daily with a meal. 05/29/23   [name], MD  Cholecalciferol (VITAMIN D ) 50 MCG (2000 UT) CAPS Take 2,000 Units by mouth in the morning.    [provider]   doxycycline  (VIBRA -TABS) 100 MG tablet Take 1 tablet (100 mg total) by mouth 2 (two) times daily. 06/30/23   [name], PA-C  ELIQUIS  5 MG TABS tablet TAKE 1 TABLET BY MOUTH TWICE  DAILY 06/23/23   [name], MD  furosemide  (LASIX ) 20 MG tablet TAKE 1 TABLET BY MOUTH DAILY 03/05/23   [name], MD  Hydrocortisone (CORTIZONE-10 EX) Apply 1 application  topically as needed (skin irritation/itching).    [provider]  levalbuterol  (XOPENEX  HFA) 45 MCG/ACT inhaler Inhale 2 puffs into the lungs every 4 (four) hours as needed for wheezing. 06/16/23   [name], NP  loratadine (CLARITIN) 10 MG tablet Take 10 mg by mouth daily as needed for allergies.    [provider]  losartan  (COZAAR ) 25 MG tablet Take 1 tablet (25 mg total) by mouth daily. 06/23/23 09/21/23  Riddle, [name], NP  methylPREDNISolone  (MEDROL  DOSEPAK) 4 MG TBPK tablet Take by mouth daily for 6 days. Taper over 6 days 07/01/23 07/07/23  Bernis Ernst, PA-C  Multiple Vitamin (MULTIVITAMIN) tablet Take 1 tablet by mouth daily.    [provider]  potassium chloride  20 MEQ TBCR Take 1 tablet (20 mEq total) by mouth daily. Patient taking differently: Take 40 mEq by mouth daily. 02/04/23   Riddle, [name], NP  simvastatin  (ZOCOR ) 20 MG tablet TAKE 1 TABLET BY MOUTH DAILY 05/14/23   [name], NP    Physical Exam: Vitals:   07/06/23 2145 07/06/23 2150 07/06/23 2230 07/06/23 2300  BP: (!) 171/121 (!) 161/113 (!) 153/112 (!) 169/111  Pulse: (!) 119 (!) 135 (!) 109 (!) 111  Resp: (!) 30 (!) 22 18 (!) 26  Temp:      TempSrc:      SpO2: 98% 97% 95% 93%  Weight:      Height:         Constitutional: NAD, calm  Eyes: PERTLA, lids and conjunctivae normal ENMT: Mucous membranes are moist. Posterior pharynx clear of any exudate or lesions.   Neck: supple, no masses  Respiratory: clear to auscultation bilaterally, no wheezing, no crackles. No accessory muscle use.  Cardiovascular: S1 & S2  heard, regular rate and rhythm. No extremity edema. No significant JVD. Abdomen: No distension, no tenderness, soft. Bowel sounds active.  Musculoskeletal: no clubbing / cyanosis. No joint deformity upper and lower extremities.   Skin: no significant rashes, lesions, ulcers. Warm, dry, well-perfused. Neurologic: CN 2-12 grossly intact. Sensation intact, DTR normal. Strength 5/5 in all 4 limbs. Alert and oriented.  Psychiatric: Pleasant. Cooperative.    Labs and Imaging on Admission: I have personally reviewed following labs and imaging studies  CBC: Recent Labs  Lab 07/01/23 1610 07/06/23 1545  WBC 11.1* 13.8*  NEUTROABS 7.9*  --   HGB 10.9* 12.1  HCT 33.4* 37.6  MCV 90.8 90.0  PLT 271 421*   Basic Metabolic Panel: Recent Labs  Lab 07/01/23 1610 07/06/23 1545  NA 136 137  K 3.8 4.7  CL 99 98  CO2 30 26  GLUCOSE 94 114*  BUN 11 18  CREATININE 0.66 0.67  CALCIUM  9.5 9.0   GFR: Estimated Creatinine Clearance: 75.4 mL/min (by C-G formula based on SCr of 0.67 mg/dL). Liver Function Tests: Recent Labs  Lab 07/01/23 1610  AST 19  ALT 13  ALKPHOS 93  BILITOT 0.6  PROT 7.1  ALBUMIN 3.8   No results for input(s): LIPASE, AMYLASE in the last 168 hours. No results for input(s): AMMONIA in the last 168 hours. Coagulation Profile: No results for input(s): INR, PROTIME in the last 168 hours. Cardiac Enzymes: No results for input(s): CKTOTAL, CKMB, CKMBINDEX, TROPONINI in the last 168 hours. BNP (last 3 results) Recent Labs    07/01/23 1610  PROBNP 3,772.0*   HbA1C: No results for input(s): HGBA1C in the last 72 hours. CBG: No results for input(s): GLUCAP in the last 168 hours. Lipid Profile: No results for input(s): CHOL, HDL, LDLCALC, TRIG, CHOLHDL, LDLDIRECT in the last 72 hours. Thyroid  Function Tests: No results for input(s): TSH, T4TOTAL, FREET4, T3FREE, THYROIDAB in the last 72 hours. Anemia Panel: No results for  input(s): VITAMINB12, FOLATE, FERRITIN, TIBC, IRON , RETICCTPCT in the last 72 hours. Urine analysis:    Component Value Date/Time  COLORURINE COLORLESS (A) 02/28/2022 2019   APPEARANCEUR CLEAR 02/28/2022 2019   LABSPEC 1.006 02/28/2022 2019   PHURINE 5.5 02/28/2022 2019   GLUCOSEU NEGATIVE 02/28/2022 2019   HGBUR NEGATIVE 02/28/2022 2019   BILIRUBINUR NEGATIVE 02/28/2022 2019   BILIRUBINUR negative 03/29/2021 1126   KETONESUR NEGATIVE 02/28/2022 2019   PROTEINUR NEGATIVE 02/28/2022 2019   UROBILINOGEN 0.2 03/29/2021 1126   NITRITE NEGATIVE 02/28/2022 2019   LEUKOCYTESUR NEGATIVE 02/28/2022 2019   Sepsis Labs: @LABRCNTIP (procalcitonin:4,lacticidven:4) ) Recent Results (from the past 240 hours)  Resp panel by RT-PCR (RSV, Flu A&B, Covid) Anterior Nasal Swab     Status: None   Collection Time: 07/01/23  2:45 PM   Specimen: Anterior Nasal Swab  Result Value Ref Range Status   SARS Coronavirus 2 by RT PCR NEGATIVE NEGATIVE Final    Comment: (NOTE) SARS-CoV-2 target nucleic acids are NOT DETECTED.  The SARS-CoV-2 RNA is generally detectable in upper respiratory specimens during the acute phase of infection. The lowest concentration of SARS-CoV-2 viral copies this assay can detect is 138 copies/mL. A negative result does not preclude SARS-Cov-2 infection and should not be used as the sole basis for treatment or other patient management decisions. A negative result may occur with  improper specimen collection/handling, submission of specimen other than nasopharyngeal swab, presence of viral mutation(s) within the areas targeted by this assay, and inadequate number of viral copies(<138 copies/mL). A negative result must be combined with clinical observations, patient history, and epidemiological information. The expected result is Negative.  Fact Sheet for Patients:  BloggerCourse.com  Fact Sheet for Healthcare Providers:   SeriousBroker.it  This test is no t yet approved or cleared by the [place]  FDA and  has been authorized for detection and/or diagnosis of SARS-CoV-2 by FDA under an Emergency Use Authorization (EUA). This EUA will remain  in effect (meaning this test can be used) for the duration of the COVID-19 declaration under Section 564(b)(1) of the Act, 21 U.S.C.section 360bbb-3(b)(1), unless the authorization is terminated  or revoked sooner.       Influenza A by PCR NEGATIVE NEGATIVE Final   Influenza B by PCR NEGATIVE NEGATIVE Final    Comment: (NOTE) The Xpert Xpress SARS-CoV-2/FLU/RSV plus assay is intended as an aid in the diagnosis of influenza from Nasopharyngeal swab specimens and should not be used as a sole basis for treatment. Nasal washings and aspirates are unacceptable for Xpert Xpress SARS-CoV-2/FLU/RSV testing.  Fact Sheet for Patients: BloggerCourse.com  Fact Sheet for Healthcare Providers: SeriousBroker.it  This test is not yet approved or cleared by the [place]  FDA and has been authorized for detection and/or diagnosis of SARS-CoV-2 by FDA under an Emergency Use Authorization (EUA). This EUA will remain in effect (meaning this test can be used) for the duration of the COVID-19 declaration under Section 564(b)(1) of the Act, 21 U.S.C. section 360bbb-3(b)(1), unless the authorization is terminated or revoked.     Resp Syncytial Virus by PCR NEGATIVE NEGATIVE Final    Comment: (NOTE) Fact Sheet for Patients: BloggerCourse.com  Fact Sheet for Healthcare Providers: SeriousBroker.it  This test is not yet approved or cleared by the [place]  FDA and has been authorized for detection and/or diagnosis of SARS-CoV-2 by FDA under an Emergency Use Authorization (EUA). This EUA will remain in effect (meaning this test can be used) for  the duration of the COVID-19 declaration under Section 564(b)(1) of the Act, 21 U.S.C. section 360bbb-3(b)(1), unless the authorization is terminated or revoked.  Performed at [facility], [address], [place], [place] [address]      Radiological Exams on Admission: DG Chest 2 View Result Date: 07/06/2023 CLINICAL DATA:  Chest pain palpitation EXAM: CHEST - 2 VIEW COMPARISON:  07/01/2023 FINDINGS: Left-sided pacing device as before. No acute airspace disease or effusion. Stable cardiomediastinal silhouette with aortic atherosclerosis. IMPRESSION: No active cardiopulmonary disease. Electronically Signed   By: Luke Bun M.D.   On: 07/06/2023 16:49    EKG: Independently reviewed. ***  Assessment/Plan Principal Problem:   Rapid atrial fibrillation (HCC) Active Problems:   OSA (obstructive sleep apnea)   Chronic diastolic CHF (congestive heart failure) (HCC)   Asthma   History of CVA (cerebrovascular accident)   GAD (generalized anxiety disorder)    1.  -  -  -  -  -  -  -  -   2.  -  -  -  -  -  -  -   3.  -  -  -  -  -  -  -   4.  -  -  -  -  -  -  -  -   5.  -  -  -  -  -  -  -   6.  -  -  -  -  -   7.  -  -  -  -  -  -   8.  -  -  -  -  -  -     DVT prophylaxis: ***  Code Status: ***  Level of Care: Level of care: Progressive Family Communication: ***  Disposition Plan:  Patient is from: ***  Anticipated d/c is to: *** Anticipated d/c date is: *** Patient currently: ***  Consults called: ***  Admission status: ***    Shannon GORMAN Sprinkles, MD [facility]  07/06/2023, 11:57 PM

## 2023-07-06 NOTE — Sedation Documentation (Signed)
 Pt shocked at 200J

## 2023-07-06 NOTE — Progress Notes (Deleted)
 Advanced Heart Failure Clinic Note  PCP: Knute [name], FNP PCP-Cardiologist: None HF-Cardiologist: {[facility] Cardiologist:[id number]}  HPI:  Shannon [name] is a 73 year old female with a past medical history of paroxysmal afib, SND s/p PPM,  HTN, orthostatic hypotension, OSA on CPAP, CHF, drug-induced lupus (hydrochlorothiazide ), CVA, anxiety. Patient has underwent AF ablation x 3 (2018, 2022, 2023) and previously failed tikosyn  and amiodarone . [name] in place to monitor for AFib. Her [name] alerted office on 10/27/2022 for multiple symptoms triggers during AFib w RVR episode. Experienced up multiple pauses after this, up to 4 sec. She continued to have post-conversion pauses with LH and ultimately had PPM implanted 03/2023.   Echo 10/2015, 11/2015, 03/2016 with LVEF 55-60%. Stress test in 03/2019 with no evidence of ischemia and LVEF of 50%. Coronary CT in 01/2020 noted coronary calcium  score 562 which is 94 th percentile. Echo 10/2022 with LVEF of 60-65%. NM PET perfusion study 06/17/2023 showed no evidence of ischemia, LVEF of 38%.   Visited the ED 07/01/23 for bronchitis.   Today Shannon [name] returns to [facility] Failure Clinic for pharmacist medication titration. Reports feeling ***. {Reports/Denies:210917258} {ACTIONS;DENIES/REPORTS:21021675::Denies} being able to complete all activities of daily living (ADLs). Is *** active throughout the day. Weight at home is *** pounds. Takes {CHL AMB [facility] Medications:210917260} ***. Appetite ***. {Does Follow/Does Not Follow:210917261} a low sodium diet.  Current Heart Failure Medications: Loop diuretic: furosemide  20 mg daily Beta-Blocker: carvedilol  18.75 mg BID ACEI/ARB/ARNI: losartan  25 mg daily MRA: none SGLT2i: none Other: none  Has the patient been experiencing any side effects to the medications prescribed? {yes/no:20286}  Does the patient have any problems obtaining medications due to transportation or finances?  {yes/no:20286}  Understanding of regimen: {CHL AMB [facility] Excellent/Good/Fair/Poor:210917262}  Understanding of indications: {CHL AMB [facility] Excellent/Good/Fair/Poor:210917262}  Potential of adherence: {CHL AMB [facility] Excellent/Good/Fair/Poor:210917262}  Patient understands to avoid NSAIDs.  Patient understands to avoid decongestants.  Pertinent Lab Values: Creatinine, Ser  Date Value Ref Range Status  07/01/2023 0.66 0.44 - 1.00 mg/dL Final   BUN  Date Value Ref Range Status  07/01/2023 11 8 - 23 mg/dL Final  97/78/7974 12 8 - 27 mg/dL Final   Potassium  Date Value Ref Range Status  07/01/2023 3.8 3.5 - 5.1 mmol/L Final   Sodium  Date Value Ref Range Status  07/01/2023 136 135 - 145 mmol/L Final  02/27/2023 141 134 - 144 mmol/L Final   B Natriuretic Peptide  Date Value Ref Range Status  01/20/2022 142.2 (H) 0.0 - 100.0 pg/mL Final    Comment:    Performed at [facility], [address] [place], [place] [address]   Magnesium   Date Value Ref Range Status  03/10/2023 2.2 1.7 - 2.4 mg/dL Final    Comment:    Performed at [facility], [address], [place], [place] [address]   TSH  Date Value Ref Range Status  03/07/2023 1.156 0.350 - 4.500 uIU/mL Final    Comment:    Performed by a 3rd Generation assay with a functional sensitivity of <=0.01 uIU/mL. Performed at [facility], [address], [place], [place] [address]   05/13/2021 1.07 0.35 - 5.50 uIU/mL Final    Vital Signs: There were no vitals filed for this visit.  Assessment/Plan: Congestive heart failure - New onset reduced LVEF of 38% noted on NM PET perfusion study on 06/17/23.  2.   Elevated CAC score  - Coronary calcium  score in 01/2020 of 562 which  is 23 th percentile - Follow-up scheduled with Dr Court on 07/08/23 for potential ischemic evaluation. Currently on simvastatin  20 mg daily. LDL 10/2022 of 62 mg/dL.  3.   SND s/p PPM -PPM functioning well per last EP visit.   4.    Paroxysmal Afib - Continues to have paroxysms of AFib - No AAD options - S/p AF ablation x 3 - Continue 18.75 mg coreg  BID  Follow up: ***  ***

## 2023-07-06 NOTE — Progress Notes (Signed)
 Full consult note to follow. 62F with persistent AF s/p 3 RF ablations who presents following URI and steroids with recurrent AF/RVR. Has LEFT MDT DC PPM. Multiple medication intolerances. Previously on flecainide , short term on amio. Currently on dilt gtt at 10 mg/h. DCCV (200J) x2 with continued AF/RVR 110-130s. Went into AF today based on device interrogation, other episodes on 06/23 for 13 hours, ~4 hours. Previously normal LVEF however on PET/CT 06/17/23 with LVEF 38% globally. She thinks she has a lot of fatigue related to coreg  and really doesn't want to continue. Ideally on long acting BB with HFrEF and AF/RVR. Will start metop tartrate 25 mg q6h and once rate controlled stop dilt and convert to toprol  XL at discharge. She is not interested in trying amio again as this would be our one remaining AAD option.

## 2023-07-06 NOTE — ED Triage Notes (Signed)
 From home states that she had fluttering in her chest. Got a pace maker in April. Denies issues with it. Denies chest pain at this time  22g L hand

## 2023-07-06 NOTE — H&P (Signed)
 History and Physical    Shatara Stanek FMW:[id number] DOB: 01/26/1950 DOA: 07/06/2023  PCP: Knute [name], FNP   Patient coming from: Home   Chief Complaint: Heart is fluttering, lightheadedness   HPI: Shannon [name] is a 73 y.o. female with medical history significant for OSA on CPAP, anxiety, hypertension, and atrial fibrillation on Eliquis  who is status post ablation x 3, sinus node dysfunction with pacemaker, and low EF recently identified on PET/CT who presents with lightheadedness and sensation of her heart fluttering.  Patient reports that she had been experiencing upper respiratory symptoms for the past 1-1/2 weeks, was started on doxycycline  on 06/30/2023 and then steroids the following day.  These upper respiratory symptoms have improved significantly but today she had acute onset of a sensation of her heart fluttering.  She has had some lightheadedness associated with this and family notes that she appeared pale.  Aside from the fluttering sensation, there is no chest pressure or pain, no recent leg swelling, and no shortness of breath.  She reports strict adherence with her Eliquis .  ED Course: Upon arrival to the ED, patient is found to be afebrile and saturating well on room air with elevated heart rate and stable BP.  EKG demonstrates atrial fibrillation with RVR.  Chest x-ray was negative for acute findings.  Labs are most notable for normal creatinine, WBC 13,800, and troponin 44.  ED physician discussed case with cardiology, administered IV diltiazem , and attempted electrical cardioversion x 2 without success.  She was started on diltiazem  infusion.  Review of Systems:  All other systems reviewed and apart from HPI, are negative.  Past Medical History:  Diagnosis Date   Allergy    Anemia    Anxiety    Arthritis    neck and lower back (03/25/2016)   Asthma 1990s X 1   short term inhaler use    CAD (coronary artery disease)    Cardiac pacemaker in  situ    MDT   Chronic lower back pain    Degenerative disorder of bone    Depression    Diastolic dysfunction    Drug-induced lupus erythematosus    HCTZ induced; still gettin over it (03/25/2016)   GERD (gastroesophageal reflux disease)    Herniated disc, cervical    Hyperlipidemia    Hypertension    Neuromuscular disorder (HCC)    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 HTN   Orthostatic hypotension    OSA on CPAP    Osteopenia    PAF (paroxysmal atrial fibrillation) (HCC)    Pinched nerve in neck    Sinus node dysfunction (HCC)    Sleep apnea    wears CPAP   T12 compression fracture (HCC) 11/2015   Vitamin D  deficiency    Whiplash injury 06/07/2010    Past Surgical History:  Procedure Laterality Date   APPENDECTOMY  1990s   ATRIAL FIBRILLATION ABLATION N/A 03/25/2016   Procedure: Atrial Fibrillation Ablation;  Surgeon: Lynwood Rakers, MD;  Location: [facility] INVASIVE CV LAB;  Service: Cardiovascular;  Laterality: N/A;   ATRIAL FIBRILLATION ABLATION N/A 01/31/2020   Procedure: ATRIAL FIBRILLATION ABLATION;  Surgeon: Rakers Lynwood, MD;  Location: [facility] INVASIVE CV LAB;  Service: Cardiovascular;  Laterality: N/A;   ATRIAL FIBRILLATION ABLATION N/A 12/13/2021   Procedure: ATRIAL FIBRILLATION ABLATION;  Surgeon: [name], MD;  Location: [facility] INVASIVE CV LAB;  Service: Cardiovascular;  Laterality: N/A;   COLONOSCOPY     FOREARM FRACTURE SURGERY Left ~ 02/2011  broke arm; shattered wrist   FOREARM HARDWARE REMOVAL Left ~ 07/2011   implantable loop recorder placement  03/07/2019   Medtronic Reveal Linq model LNQ 22 implantable loop recorder [id number]) implanted by Dr Kelsie for Afib management   LOOP RECORDER REMOVAL N/A 03/09/2023   Procedure: LOOP RECORDER REMOVAL;  Surgeon: Kennyth Chew, MD;  Location: [facility] INVASIVE CV LAB;  Service: Cardiovascular;  Laterality: N/A;   PACEMAKER IMPLANT N/A 03/09/2023   Procedure: PACEMAKER IMPLANT;  Surgeon: Kennyth Chew, MD;   Location: [facility] INVASIVE CV LAB;  Service: Cardiovascular;  Laterality: N/A;   Spinal Nerve Ablation     TEE WITHOUT CARDIOVERSION N/A 03/24/2016   Procedure: TRANSESOPHAGEAL ECHOCARDIOGRAM (TEE);  Surgeon: Jerel Balding, MD;  Location: [facility] ENDOSCOPY;  Service: Cardiovascular;  Laterality: N/A;    Social History:   reports that she quit smoking about 9 years ago. Her smoking use included cigarettes and e-cigarettes. She started smoking about 48 years ago. She has a 22 pack-year smoking history. She has never used smokeless tobacco. She reports that she does not currently use alcohol. She reports that she does not use drugs.  Allergies  Allergen Reactions   Sulfur Nausea And Vomiting   Ace Inhibitors Cough   Amiodarone  Other (See Comments)    Intolerance multiple side effects   Hctz [Hydrochlorothiazide ] Other (See Comments)    Caused drug-induced LUPUS   Oxycodone Other (See Comments)    Hallucinations   Prednisone  Other (See Comments)    Made patient very aggressive   Sulfa Antibiotics Nausea And Vomiting   Voltaren [Diclofenac Sodium] Other (See Comments)    Made patient become aggressive    Family History  Problem Relation Age of Onset   Lung cancer Mother    Stroke Father    Hypertension Father    Heart disease Father    Hypertension Maternal Grandmother    Stroke Maternal Grandfather    Heart disease Maternal Grandfather    Diabetes Paternal Grandmother    Heart disease Paternal Grandmother    Diabetes Paternal Grandfather    Bipolar disorder Daughter    Other Daughter        fatty liver   Other Son        fattye liver, born with 1 kidney   Colon cancer Neg Hx    Esophageal cancer Neg Hx    Rectal cancer Neg Hx    Stomach cancer Neg Hx      Prior to Admission medications   Medication Sig Start Date End Date Taking? Authorizing Provider  acetaminophen  (TYLENOL ) 500 MG tablet Take 1,000 mg by mouth every 6 (six) hours as needed for moderate pain or headache.     [provider]  benzonatate  (TESSALON ) 100 MG capsule Take 1 capsule (100 mg total) by mouth 3 (three) times daily as needed for cough. 06/30/23   [name], PA-C  busPIRone  (BUSPAR ) 5 MG tablet Take 1 tablet (5 mg total) by mouth 2 (two) times daily. 06/25/23   [name], NP  carvedilol  (COREG ) 6.25 MG tablet Take 3 tablets (18.75 mg total) by mouth 2 (two) times daily with a meal. 05/29/23   [name], MD  Cholecalciferol (VITAMIN D ) 50 MCG (2000 UT) CAPS Take 2,000 Units by mouth in the morning.    [provider]  doxycycline  (VIBRA -TABS) 100 MG tablet Take 1 tablet (100 mg total) by mouth 2 (two) times daily. 06/30/23   [name], PA-C  ELIQUIS  5 MG TABS tablet TAKE  1 TABLET BY MOUTH TWICE  DAILY 06/23/23   [name], MD  furosemide  (LASIX ) 20 MG tablet TAKE 1 TABLET BY MOUTH DAILY 03/05/23   [name], MD  Hydrocortisone (CORTIZONE-10 EX) Apply 1 application  topically as needed (skin irritation/itching).    [provider]  levalbuterol  (XOPENEX  HFA) 45 MCG/ACT inhaler Inhale 2 puffs into the lungs every 4 (four) hours as needed for wheezing. 06/16/23   [name], NP  loratadine (CLARITIN) 10 MG tablet Take 10 mg by mouth daily as needed for allergies.    [provider]  losartan  (COZAAR ) 25 MG tablet Take 1 tablet (25 mg total) by mouth daily. 06/23/23 09/21/23  Riddle, [name], NP  methylPREDNISolone  (MEDROL  DOSEPAK) 4 MG TBPK tablet Take by mouth daily for 6 days. Taper over 6 days 07/01/23 07/07/23  Bernis Ernst, PA-C  Multiple Vitamin (MULTIVITAMIN) tablet Take 1 tablet by mouth daily.    [provider]  potassium chloride  20 MEQ TBCR Take 1 tablet (20 mEq total) by mouth daily. Patient taking differently: Take 40 mEq by mouth daily. 02/04/23   Riddle, [name], NP  simvastatin  (ZOCOR ) 20 MG tablet TAKE 1 TABLET BY MOUTH DAILY 05/14/23   [name], NP    Physical Exam: Vitals:   07/06/23 2145  07/06/23 2150 07/06/23 2230 07/06/23 2300  BP: (!) 171/121 (!) 161/113 (!) 153/112 (!) 169/111  Pulse: (!) 119 (!) 135 (!) 109 (!) 111  Resp: (!) 30 (!) 22 18 (!) 26  Temp:      TempSrc:      SpO2: 98% 97% 95% 93%  Weight:      Height:        Constitutional: NAD, calm  Eyes: PERTLA, lids and conjunctivae normal ENMT: Mucous membranes are moist. Posterior pharynx clear of any exudate or lesions.   Neck: supple, no masses  Respiratory: no wheezing, no crackles. No accessory muscle use.  Cardiovascular: Rate ~120 and irregularly irregular. No extremity edema.   Abdomen: No distension, no tenderness, soft. Bowel sounds active.  Musculoskeletal: no clubbing / cyanosis. No joint deformity upper and lower extremities.   Skin: no significant rashes, lesions, ulcers. Warm, dry, well-perfused. Neurologic: CN 2-12 grossly intact. Moving all extremities. Alert and oriented.  Psychiatric: Pleasant. Cooperative.    Labs and Imaging on Admission: I have personally reviewed following labs and imaging studies  CBC: Recent Labs  Lab 07/01/23 1610 07/06/23 1545  WBC 11.1* 13.8*  NEUTROABS 7.9*  --   HGB 10.9* 12.1  HCT 33.4* 37.6  MCV 90.8 90.0  PLT 271 421*   Basic Metabolic Panel: Recent Labs  Lab 07/01/23 1610 07/06/23 1545  NA 136 137  K 3.8 4.7  CL 99 98  CO2 30 26  GLUCOSE 94 114*  BUN 11 18  CREATININE 0.66 0.67  CALCIUM  9.5 9.0   GFR: Estimated Creatinine Clearance: 75.4 mL/min (by C-G formula based on SCr of 0.67 mg/dL). Liver Function Tests: Recent Labs  Lab 07/01/23 1610  AST 19  ALT 13  ALKPHOS 93  BILITOT 0.6  PROT 7.1  ALBUMIN 3.8   No results for input(s): LIPASE, AMYLASE in the last 168 hours. No results for input(s): AMMONIA in the last 168 hours. Coagulation Profile: No results for input(s): INR, PROTIME in the last 168 hours. Cardiac Enzymes: No results for input(s): CKTOTAL, CKMB, CKMBINDEX, TROPONINI in the last 168 hours. BNP  (last 3 results) Recent Labs    07/01/23 1610  PROBNP 3,772.0*  HbA1C: No results for input(s): HGBA1C in the last 72 hours. CBG: No results for input(s): GLUCAP in the last 168 hours. Lipid Profile: No results for input(s): CHOL, HDL, LDLCALC, TRIG, CHOLHDL, LDLDIRECT in the last 72 hours. Thyroid  Function Tests: No results for input(s): TSH, T4TOTAL, FREET4, T3FREE, THYROIDAB in the last 72 hours. Anemia Panel: No results for input(s): VITAMINB12, FOLATE, FERRITIN, TIBC, IRON , RETICCTPCT in the last 72 hours. Urine analysis:    Component Value Date/Time   COLORURINE COLORLESS (A) 02/28/2022 2019   APPEARANCEUR CLEAR 02/28/2022 2019   LABSPEC 1.006 02/28/2022 2019   PHURINE 5.5 02/28/2022 2019   GLUCOSEU NEGATIVE 02/28/2022 2019   HGBUR NEGATIVE 02/28/2022 2019   BILIRUBINUR NEGATIVE 02/28/2022 2019   BILIRUBINUR negative 03/29/2021 1126   KETONESUR NEGATIVE 02/28/2022 2019   PROTEINUR NEGATIVE 02/28/2022 2019   UROBILINOGEN 0.2 03/29/2021 1126   NITRITE NEGATIVE 02/28/2022 2019   LEUKOCYTESUR NEGATIVE 02/28/2022 2019   Sepsis Labs: @LABRCNTIP (procalcitonin:4,lacticidven:4) ) Recent Results (from the past 240 hours)  Resp panel by RT-PCR (RSV, Flu A&B, Covid) Anterior Nasal Swab     Status: None   Collection Time: 07/01/23  2:45 PM   Specimen: Anterior Nasal Swab  Result Value Ref Range Status   SARS Coronavirus 2 by RT PCR NEGATIVE NEGATIVE Final    Comment: (NOTE) SARS-CoV-2 target nucleic acids are NOT DETECTED.  The SARS-CoV-2 RNA is generally detectable in upper respiratory specimens during the acute phase of infection. The lowest concentration of SARS-CoV-2 viral copies this assay can detect is 138 copies/mL. A negative result does not preclude SARS-Cov-2 infection and should not be used as the sole basis for treatment or other patient management decisions. A negative result may occur with  improper specimen  collection/handling, submission of specimen other than nasopharyngeal swab, presence of viral mutation(s) within the areas targeted by this assay, and inadequate number of viral copies(<138 copies/mL). A negative result must be combined with clinical observations, patient history, and epidemiological information. The expected result is Negative.  Fact Sheet for Patients:  BloggerCourse.com  Fact Sheet for Healthcare Providers:  SeriousBroker.it  This test is no t yet approved or cleared by the [place]  FDA and  has been authorized for detection and/or diagnosis of SARS-CoV-2 by FDA under an Emergency Use Authorization (EUA). This EUA will remain  in effect (meaning this test can be used) for the duration of the COVID-19 declaration under Section 564(b)(1) of the Act, 21 U.S.C.section 360bbb-3(b)(1), unless the authorization is terminated  or revoked sooner.       Influenza A by PCR NEGATIVE NEGATIVE Final   Influenza B by PCR NEGATIVE NEGATIVE Final    Comment: (NOTE) The Xpert Xpress SARS-CoV-2/FLU/RSV plus assay is intended as an aid in the diagnosis of influenza from Nasopharyngeal swab specimens and should not be used as a sole basis for treatment. Nasal washings and aspirates are unacceptable for Xpert Xpress SARS-CoV-2/FLU/RSV testing.  Fact Sheet for Patients: BloggerCourse.com  Fact Sheet for Healthcare Providers: SeriousBroker.it  This test is not yet approved or cleared by the [place]  FDA and has been authorized for detection and/or diagnosis of SARS-CoV-2 by FDA under an Emergency Use Authorization (EUA). This EUA will remain in effect (meaning this test can be used) for the duration of the COVID-19 declaration under Section 564(b)(1) of the Act, 21 U.S.C. section 360bbb-3(b)(1), unless the authorization is terminated or revoked.     Resp Syncytial  Virus by PCR NEGATIVE NEGATIVE Final    Comment: (NOTE) Fact  Sheet for Patients: BloggerCourse.com  Fact Sheet for Healthcare Providers: SeriousBroker.it  This test is not yet approved or cleared by the [place]  FDA and has been authorized for detection and/or diagnosis of SARS-CoV-2 by FDA under an Emergency Use Authorization (EUA). This EUA will remain in effect (meaning this test can be used) for the duration of the COVID-19 declaration under Section 564(b)(1) of the Act, 21 U.S.C. section 360bbb-3(b)(1), unless the authorization is terminated or revoked.  Performed at [facility], [address], [place], [place] [address]      Radiological Exams on Admission: DG Chest 2 View Result Date: 07/06/2023 CLINICAL DATA:  Chest pain palpitation EXAM: CHEST - 2 VIEW COMPARISON:  07/01/2023 FINDINGS: Left-sided pacing device as before. No acute airspace disease or effusion. Stable cardiomediastinal silhouette with aortic atherosclerosis. IMPRESSION: No active cardiopulmonary disease. Electronically Signed   By: Luke Bun M.D.   On: 07/06/2023 16:49    EKG: Independently reviewed. Atrial fibrillation, rate 124.   Assessment/Plan   1. Rapid atrial fibrillation  - Appreciate cardiology recommendations  - Lopressor  25 mg q6h has been started with plan to stop diltiazem  infusion when rate better controlled  - Continue Eliquis , continue metoprolol , stop diltiazem  infusion when able   2. Chronic HFrEF  - EF was 38% on PET/CT June 17, 2023  - Appears compensated  - Continue Lasix , losartan , beta-blocker    3. Hx of CVA  - Zocor , Eliquis     4. OSA; asthma  - Not in asthma exacerbation  - CPAP while sleeping    5. Anxiety  - Buspar     DVT prophylaxis: Eliquis   Code Status: Full  Level of Care: Level of care: Progressive Family Communication: Family at bedside  Disposition Plan:  Patient is from:  Home  Anticipated d/c is to: Home  Anticipated d/c date is: Possibly as early as 7/1 or 7/2  Patient currently: Pending management of rapid atrial fibrillation  Consults called: Cardiology  Admission status: Observation     Evalene GORMAN Sprinkles, MD [facility]  07/06/2023, 11:57 PM

## 2023-07-06 NOTE — ED Provider Notes (Signed)
 [place] EMERGENCY DEPARTMENT AT [facility] Provider Note   CSN: [id number] Arrival date & time: 07/06/23  1500     Patient presents with: Palpitations   Shannon [name] is a 73 y.o. female.  With a history of CVA and A-fib on Eliquis  and carvedilol  who presents to the ED given concern for palpitations.  Patient first experienced palpitations around 1300 today which have persisted since the onset.  No chest pain no increased shortness of breath.  Status post pacemaker and March 2025.  Medtronic pacer is not an AICD.  Similar visits in the past with failed chemical rate control.  Compliant with Eliquis  with last dose this morning    Palpitations      Prior to Admission medications   Medication Sig Start Date End Date Taking? Authorizing Provider  acetaminophen  (TYLENOL ) 500 MG tablet Take 1,000 mg by mouth every 6 (six) hours as needed for moderate pain or headache.    [provider]  benzonatate  (TESSALON ) 100 MG capsule Take 1 capsule (100 mg total) by mouth 3 (three) times daily as needed for cough. 06/30/23   [name], PA-C  busPIRone  (BUSPAR ) 5 MG tablet Take 1 tablet (5 mg total) by mouth 2 (two) times daily. 06/25/23   [name], NP  carvedilol  (COREG ) 6.25 MG tablet Take 3 tablets (18.75 mg total) by mouth 2 (two) times daily with a meal. 05/29/23   [name], MD  Cholecalciferol (VITAMIN D ) 50 MCG (2000 UT) CAPS Take 2,000 Units by mouth in the morning.    [provider]  doxycycline  (VIBRA -TABS) 100 MG tablet Take 1 tablet (100 mg total) by mouth 2 (two) times daily. 06/30/23   [name], PA-C  ELIQUIS  5 MG TABS tablet TAKE 1 TABLET BY MOUTH TWICE  DAILY 06/23/23   [name], MD  furosemide  (LASIX ) 20 MG tablet TAKE 1 TABLET BY MOUTH DAILY 03/05/23   [name], MD  Hydrocortisone (CORTIZONE-10 EX) Apply 1 application  topically as needed (skin irritation/itching).    [provider]   levalbuterol  (XOPENEX  HFA) 45 MCG/ACT inhaler Inhale 2 puffs into the lungs every 4 (four) hours as needed for wheezing. 06/16/23   [name], NP  loratadine (CLARITIN) 10 MG tablet Take 10 mg by mouth daily as needed for allergies.    [provider]  losartan  (COZAAR ) 25 MG tablet Take 1 tablet (25 mg total) by mouth daily. 06/23/23 09/21/23  Riddle, [name], NP  methylPREDNISolone  (MEDROL  DOSEPAK) 4 MG TBPK tablet Take by mouth daily for 6 days. Taper over 6 days 07/01/23 07/07/23  Bernis Ernst, PA-C  Multiple Vitamin (MULTIVITAMIN) tablet Take 1 tablet by mouth daily.    [provider]  potassium chloride  20 MEQ TBCR Take 1 tablet (20 mEq total) by mouth daily. Patient taking differently: Take 40 mEq by mouth daily. 02/04/23   Riddle, [name], NP  simvastatin  (ZOCOR ) 20 MG tablet TAKE 1 TABLET BY MOUTH DAILY 05/14/23   [name], NP    Allergies: Sulfur, Ace inhibitors, Amiodarone , Hctz [hydrochlorothiazide ], Oxycodone, Prednisone , Sulfa antibiotics, and Voltaren [diclofenac sodium]    Review of Systems  Cardiovascular:  Positive for palpitations.    Updated Vital Signs BP (!) 169/111   Pulse (!) 111   Temp 98.3 F (36.8 C) (Oral)   Resp (!) 26   Ht 5' 7 (1.702 m)   Wt 95.3 kg   SpO2 93%   BMI 32.89 kg/m  Physical Exam Vitals and nursing note reviewed.  HENT:     Head: Normocephalic and atraumatic.   Eyes:     Pupils: Pupils are equal, round, and reactive to light.    Cardiovascular:     Rate and Rhythm: Tachycardia present. Rhythm irregular.  Pulmonary:     Effort: Pulmonary effort is normal.     Breath sounds: Normal breath sounds.  Abdominal:     Palpations: Abdomen is soft.     Tenderness: There is no abdominal tenderness.   Skin:    General: Skin is warm and dry.   Neurological:     Mental Status: She is alert.   Psychiatric:        Mood and Affect: Mood normal.     (all labs ordered are listed, but only abnormal results are  displayed) Labs Reviewed  BASIC METABOLIC PANEL WITH GFR - Abnormal; Notable for the following components:      Result Value   Glucose, Bld 114 (*)    All other components within normal limits  CBC - Abnormal; Notable for the following components:   WBC 13.8 (*)    Platelets 421 (*)    All other components within normal limits  TROPONIN I (HIGH SENSITIVITY) - Abnormal; Notable for the following components:   Troponin I (High Sensitivity) 44 (*)    All other components within normal limits  TROPONIN I (HIGH SENSITIVITY) - Abnormal; Notable for the following components:   Troponin I (High Sensitivity) 50 (*)    All other components within normal limits    EKG: None  Radiology: DG Chest 2 View Result Date: 07/06/2023 CLINICAL DATA:  Chest pain palpitation EXAM: CHEST - 2 VIEW COMPARISON:  07/01/2023 FINDINGS: Left-sided pacing device as before. No acute airspace disease or effusion. Stable cardiomediastinal silhouette with aortic atherosclerosis. IMPRESSION: No active cardiopulmonary disease. Electronically Signed   By: Luke Bun M.D.   On: 07/06/2023 16:49     .Sedation  Date/Time: 07/06/2023 9:30 PM  Performed by: Pamella Ozell LABOR, DO Authorized by: Pamella Ozell LABOR, DO   Consent:    Consent obtained:  Verbal   Consent given by:  Patient   Risks discussed:  Allergic reaction, dysrhythmia, inadequate sedation, nausea, prolonged hypoxia resulting in organ damage, prolonged sedation necessitating reversal, respiratory compromise necessitating ventilatory assistance and intubation and vomiting   Alternatives discussed:  Analgesia without sedation, anxiolysis and regional anesthesia Universal protocol:    Procedure explained and questions answered to patient or proxy's satisfaction: yes     Relevant documents present and verified: yes     Test results available: yes     Imaging studies available: yes     Required blood products, implants, devices, and special equipment  available: yes     Site/side marked: yes     Immediately prior to procedure, a time out was called: yes     Patient identity confirmed:  Verbally with patient Indications:    Procedure performed:  Cardioversion   Procedure necessitating sedation performed by:  Physician performing sedation Pre-sedation assessment:    Time since last food or drink:  1500   ASA classification: class 1 - normal, healthy patient     Mouth opening:  3 or more finger widths   Thyromental distance:  4 finger widths   Mallampati score:  I - soft palate, uvula, fauces, pillars visible   Neck mobility: normal     Pre-sedation assessments completed and reviewed: pre-procedure airway patency not reviewed, pre-procedure cardiovascular function  not reviewed, pre-procedure hydration status not reviewed, pre-procedure mental status not reviewed, pre-procedure nausea and vomiting status not reviewed, pre-procedure pain level not reviewed, pre-procedure respiratory function not reviewed and pre-procedure temperature not reviewed   A pre-sedation assessment was completed prior to the start of the procedure Immediate pre-procedure details:    Reassessment: Patient reassessed immediately prior to procedure     Reviewed: vital signs, relevant labs/tests and NPO status     Verified: bag valve mask available, emergency equipment available, intubation equipment available, IV patency confirmed, oxygen available and suction available   Procedure details (see MAR for exact dosages):    Preoxygenation:  Nasal cannula   Sedation:  Etomidate    Intended level of sedation: deep   Intra-procedure monitoring:  Blood pressure monitoring, cardiac monitor, continuous pulse oximetry, frequent LOC assessments, frequent vital sign checks and continuous capnometry   Intra-procedure events: none     Total Provider sedation time (minutes):  12 Post-procedure details:   A post-sedation assessment was completed following the completion of the  procedure.   Attendance: Constant attendance by certified staff until patient recovered     Recovery: Patient returned to pre-procedure baseline     Post-sedation assessments completed and reviewed: airway patency, cardiovascular function, hydration status, mental status, nausea/vomiting, pain level, respiratory function and temperature     Patient is stable for discharge or admission: yes     Procedure completion:  Tolerated well, no immediate complications .Cardioversion  Date/Time: 07/06/2023 9:35 PM  Performed by: Pamella Ozell LABOR, DO Authorized by: Pamella Ozell LABOR, DO   Consent:    Consent obtained:  Verbal   Consent given by:  Patient   Risks discussed:  Cutaneous burn, death, induced arrhythmia and pain   Alternatives discussed:  No treatment, rate-control medication and alternative treatment Pre-procedure details:    Cardioversion basis:  Elective   Rhythm:  Atrial fibrillation   Electrode placement:  Anterior-posterior Patient sedated: Yes. Refer to sedation procedure documentation for details of sedation.  Attempt one:    Cardioversion mode:  Synchronous   Waveform:  Monophasic   Shock (Joules):  150   Shock outcome:  No change in rhythm Attempt two:    Cardioversion mode:  Synchronous   Waveform:  Monophasic   Shock (Joules):  200   Shock outcome:  No change in rhythm Post-procedure details:    Patient status:  Awake   Patient tolerance of procedure:  Tolerated well, no immediate complications .Critical Care  Performed by: Pamella Ozell LABOR, DO Authorized by: Pamella Ozell LABOR, DO   Critical care provider statement:    Critical care time (minutes):  40   Critical care was necessary to treat or prevent imminent or life-threatening deterioration of the following conditions:  Cardiac failure   Critical care was time spent personally by me on the following activities:  Development of treatment plan with patient or surrogate, discussions with consultants, evaluation of  patient's response to treatment, examination of patient, ordering and review of laboratory studies, ordering and review of radiographic studies, ordering and performing treatments and interventions, pulse oximetry, re-evaluation of patient's condition and review of old charts   I assumed direction of critical care for this patient from another provider in my specialty: no     Care discussed with: admitting provider   Comments:     Discussed with cardiology and hospitalist    Medications Ordered in the ED  diltiazem  (CARDIZEM ) 125 mg in dextrose  5% 125 mL (1 mg/mL) infusion (10 mg/hr Intravenous  Rate/Dose Change 07/06/23 2255)  diltiazem  (CARDIZEM ) injection 10 mg (10 mg Intravenous Given 07/06/23 1729)  etomidate  (AMIDATE ) injection 8 mg (8 mg Intravenous Given 07/06/23 2141)    Clinical Course as of 07/06/23 2316  Mon Jul 06, 2023  1721 Discussed with Dr.Nishan (cardiology) who recommends load with 10 mg Cardizem  and attempt electrocardioversion. [MP]  2210 Rate did have some response to a dose of diltiazem  earlier but still RVR.  Unfortunately electrocardioversion failed after 2 attempts.  Remains in A-fib with RVR rate in the 110s to 120s.  Blood pressure has been hypertensive.  Will initiate diltiazem  drip   discussed this with cardiology again Dr. Almetta who agrees with plan for admission and will evaluate patient in the ED.  Discussed with Dr. Charlton hospitalist who accepts patient for admission [MP]    Clinical Course User Index [MP] Pamella Ozell LABOR, DO                                 Medical Decision Making 73 year old female with history as above presenting to the ED given concern for palpitations.  A-fib with RVR.  On carvedilol  and Eliquis .  Last dose of carvedilol  and Eliquis  was at 1000 today.  Persistently in A-fib with RVR rate oscillating between 120s 140s.  Hemodynamically stable slightly hypertensive here.  Pacer in place with known AICD.  Will need to interrogate her  Medtronic pacemaker obtain laboratory workup EKG and plan for cardioversion.  Cardiac perfusion scan from earlier this month shows EF of 38%.  Considering history of refractory A-fib to electrical cardioversion and chemical cardioversion will reach out to cardiology first to discuss a good plan for rate control  Amount and/or Complexity of Data Reviewed Labs: ordered. Radiology: ordered.  Risk Prescription drug management. Decision regarding hospitalization.        Final diagnoses:  Atrial fibrillation with rapid ventricular response [facility])    ED Discharge Orders     None          Pamella Ozell LABOR, DO 07/06/23 2318

## 2023-07-06 NOTE — Progress Notes (Signed)
 LD apixaban  5 mg bid at 1000 (06/30), ordered now dose post DCCV. Rest of meds defer to medicine.

## 2023-07-07 ENCOUNTER — Observation Stay (HOSPITAL_BASED_OUTPATIENT_CLINIC_OR_DEPARTMENT_OTHER)

## 2023-07-07 ENCOUNTER — Other Ambulatory Visit

## 2023-07-07 DIAGNOSIS — I4891 Unspecified atrial fibrillation: Secondary | ICD-10-CM | POA: Diagnosis not present

## 2023-07-07 LAB — BASIC METABOLIC PANEL WITH GFR
Anion gap: 8 (ref 5–15)
BUN: 15 mg/dL (ref 8–23)
CO2: 29 mmol/L (ref 22–32)
Calcium: 8.6 mg/dL — ABNORMAL LOW (ref 8.9–10.3)
Chloride: 99 mmol/L (ref 98–111)
Creatinine, Ser: 0.72 mg/dL (ref 0.44–1.00)
GFR, Estimated: >60 mL/min (ref >60)
Glucose, Bld: 109 mg/dL — ABNORMAL HIGH (ref 70–99)
Potassium: 3.4 mmol/L — ABNORMAL LOW (ref 3.5–5.1)
Sodium: 136 mmol/L (ref 135–145)

## 2023-07-07 LAB — CBC
HCT: 39.5 % (ref 36.0–46.0)
Hemoglobin: 12.5 g/dL (ref 12.0–15.0)
MCH: 29.3 pg (ref 26.0–34.0)
MCHC: 31.6 g/dL (ref 30.0–36.0)
MCV: 92.5 fL (ref 80.0–100.0)
Platelets: 398 10*3/uL (ref 150–400)
RBC: 4.27 MIL/uL (ref 3.87–5.11)
RDW: 13.7 % (ref 11.5–15.5)
WBC: 14.1 10*3/uL — ABNORMAL HIGH (ref 4.0–10.5)
nRBC: 0 % (ref 0.0–0.2)

## 2023-07-07 LAB — ECHOCARDIOGRAM COMPLETE
AR max vel: 2.22 cm2
AV Peak grad: 4.7 mmHg
Ao pk vel: 1.08 m/s
Area-P 1/2: 4.96 cm2
Height: 67 in
S' Lateral: 3.9 cm
Weight: 3012.8 [oz_av]

## 2023-07-07 LAB — MAGNESIUM: Magnesium: 2.2 mg/dL (ref 1.7–2.4)

## 2023-07-07 MED ORDER — SODIUM CHLORIDE 0.9 % IV BOLUS
1000.0000 mL | Freq: Once | INTRAVENOUS | Status: AC
  Administered 2023-07-07: 1000 mL via INTRAVENOUS

## 2023-07-07 MED ORDER — LEVALBUTEROL TARTRATE 45 MCG/ACT IN AERO: 2.0000 | INHALATION_SPRAY | RESPIRATORY_TRACT | Status: DC | PRN

## 2023-07-07 MED ORDER — DIPHENHYDRAMINE HCL 50 MG/ML IJ SOLN
INTRAMUSCULAR | Status: AC
  Administered 2023-07-07: 25 mg via INTRAVENOUS
  Filled 2023-07-07: qty 1

## 2023-07-07 MED ORDER — PERFLUTREN LIPID MICROSPHERE
1.0000 mL | INTRAVENOUS | Status: DC | PRN
  Administered 2023-07-07: 4 mL via INTRAVENOUS

## 2023-07-07 MED ORDER — POTASSIUM CHLORIDE CRYS ER 20 MEQ PO TBCR
40.0000 meq | EXTENDED_RELEASE_TABLET | ORAL | Status: AC
  Administered 2023-07-07 (×2): 40 meq via ORAL
  Filled 2023-07-07: qty 2

## 2023-07-07 MED ORDER — FAMOTIDINE IN NACL 20-0.9 MG/50ML-% IV SOLN
20.0000 mg | Freq: Once | INTRAVENOUS | Status: AC
  Administered 2023-07-07: 20 mg via INTRAVENOUS
  Filled 2023-07-07: qty 50

## 2023-07-07 MED ORDER — DIPHENHYDRAMINE HCL 50 MG/ML IJ SOLN: 25.0000 mg | Freq: Once | INTRAMUSCULAR | Status: AC

## 2023-07-07 MED ORDER — METOPROLOL TARTRATE 5 MG/5ML IV SOLN
INTRAVENOUS | Status: AC
  Administered 2023-07-07: 5 mg via INTRAVENOUS
  Filled 2023-07-07: qty 5

## 2023-07-07 MED ORDER — DOXYCYCLINE HYCLATE 100 MG PO TABS
100.0000 mg | ORAL_TABLET | Freq: Two times a day (BID) | ORAL | Status: AC
  Administered 2023-07-07 – 2023-07-09 (×4): 100 mg via ORAL
  Filled 2023-07-07 (×4): qty 1

## 2023-07-07 MED ORDER — METOPROLOL TARTRATE 5 MG/5ML IV SOLN: 5.0000 mg | Freq: Once | INTRAVENOUS | Status: AC

## 2023-07-07 MED ORDER — BENZONATATE 100 MG PO CAPS
100.0000 mg | ORAL_CAPSULE | Freq: Three times a day (TID) | ORAL | Status: DC
  Administered 2023-07-07 – 2023-07-09 (×5): 100 mg via ORAL
  Filled 2023-07-07 (×5): qty 1

## 2023-07-07 MED ORDER — METOPROLOL TARTRATE 50 MG PO TABS
50.0000 mg | ORAL_TABLET | Freq: Four times a day (QID) | ORAL | Status: DC
  Administered 2023-07-07 – 2023-07-08 (×4): 50 mg via ORAL
  Filled 2023-07-07 (×4): qty 1

## 2023-07-07 MED ORDER — METOPROLOL TARTRATE 5 MG/5ML IV SOLN
5.0000 mg | INTRAVENOUS | Status: DC | PRN
  Administered 2023-07-07: 5 mg via INTRAVENOUS
  Filled 2023-07-07: qty 5

## 2023-07-07 NOTE — Progress Notes (Signed)
 Patient seen and examined.  Agree with plan per Dr. Almetta.  Ms. Henandez is a 73 year old female with a history of persistent atrial fibrillation, SSS status post PPM, lupus, OSA, CVA who presents with A-fib with RVR.  She had recent URI and was treated with prednisone  and doxycycline .  Device interrogation showed she went into A-fib yesterday.  Cardioversion was attempted x 2 in ED but unsuccessful.  She remained in A-fib with RVR with rates 110s to 130s.  She was started on diltiazem  for rate control but was recommended to switch to metoprolol  given recent stress PET with reduced EF.  She has had 3 prior ablations and has failed Tikosyn  and amiodarone .  Discussed retrial of amiodarone  but she is not interested, states she did not tolerate medication.  She remains in A-fib this morning but heart rate significantly improved, in 80s to 90s.  She is currently on metoprolol  25 mg every 6 hours and diltiazem  drip at 5 mg/h.  Would recommend weaning off diltiazem  drip.  Will check echocardiogram to confirm systolic dysfunction seen on recent PET.  Lonni LITTIE Nanas, MD
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
## 2023-07-07 NOTE — Progress Notes (Signed)
 Heart Failure Navigator Progress Note  Assessed for Heart & Vascular TOC clinic readiness.  Patient does not meet criteria due to  - not in acute HF - more afib RVR, Last EF 60-65%, NO HF TOC.   Navigator will sign off at this time.   Stephane Haddock, BSN, Scientist, clinical (histocompatibility and immunogenetics) Only

## 2023-07-07 NOTE — Plan of Care (Signed)
  Problem: Education: Goal: Knowledge of General Education information will improve Description: Including pain rating scale, medication(s)/side effects and non-pharmacologic comfort measures Outcome: Progressing   Problem: Nutrition: Goal: Adequate nutrition will be maintained Outcome: Progressing   Problem: Coping: Goal: Level of anxiety will decrease Outcome: Progressing   Problem: Education: Goal: Knowledge of disease or condition will improve Outcome: Progressing Goal: Understanding of medication regimen will improve Outcome: Progressing   Waddell Pa, RN 07/07/2023, [phone]
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
## 2023-07-07 NOTE — Significant Event (Signed)
 Rapid Response Event Note   Reason for Call :  Pt unresponsive- believed to be reaction to contrast given during Echo  Initial Focused Assessment:  On arrival, pt sitting up in bed talking. She follows commands and states that when the medication was given she felt like she could not breathe and she does not remember what happened after. Skin warm and dry to touch.  VS: HR 122, BP 170/119, RR 22, spO2 97% on NRB. No c/o sob or CP, NRB removed and spO2 remained 99% on RA.    Interventions:  IV Benadryl  Bolus Additional PIV started to R hand  Md ordered: Lopressor , Pepcid IV  Plan of Care:  Continue to monitor pt status. RN instructed to call with any changes or concerns.    Event Summary:   MD Notified: Dr Tobie notified by primary RN Call Time: 1558 Arrival Time: 1602 End Time: 1619  Nat Blunt, RN

## 2023-07-07 NOTE — Progress Notes (Signed)
 [facility] Progress Note Patient: Shannon [name] FMW:[id number] DOB: [birth year] DOA: 07/06/2023  DOS: the patient was seen and examined on 07/07/2023  Brief Hospital Course: PMH of HTN, paroxysmal A-fib on Eliquis , failed ablation 3 times, recent creatinine 5 systolic dysfunction, anxiety, depression, OSA on CPAP. Presented the hospital with complaints of lightheadedness and palpitation.  Currently recovering from upper respiratory illness. Currently in A-fib with RVR. Cardiology consulted.   Assessment and Plan: Paroxysmal A-fib with RVR. Appreciate cardiology consultation. Patient was started on Lopressor  as well as Cardizem . Given her low EF Cardizem  infusion is discontinued. Continue Lopressor  at a higher dose as well as IV Lopressor  as needed. Unable to tolerate amiodarone  in the past.  Has failed Tikosyn .  Has failed 3 ablation. Continue anticoagulation with Eliquis .  Chronic systolic CHF. Volume level adequate.  EF on recent nuc med study 45%, EF on echo 35% Currently continuing home Lasix  losartan  although low threshold to discontinue this medication to allow room for rate control. On beta-blocker. Management per cardiology. 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  History of CVA. On Eliquis  as well as Zocor .  OSA. Continue CPAP nightly.  History of asthma. Currently no evidence of exacerbation. Monitor.  Anxiety. Home regimen includes BuSpar  5 mg twice daily.  Recent upper airway infection. Was supposed to been doxycycline  we will continue but for short course is less likely bacterial infection. Was on Medrol  Dosepak which I will discontinue.  Also this explains the leukocytosis. Continue Tessalon  Perles.  Unresponsive event after receiving Definity. Patient received Definity contrast and immediately became unresponsive. Vitals were unremarkable. Rapid response was called. Heart rate was still elevated. Blood pressure was rather elevated. Patient  received IV Benadryl . After the event patient was responding normally although anxious. At the time of my evaluation did not have any acute complaints other than feeling drowsy. No focal deficit.  No stridor.  No angioedema no urticaria.  No rash. Less likely a true allergic reaction but we will add Definity to her intolerance list. Will give her 1 dose of IV Pepcid.  Subjective: No nausea no vomiting.  Reports fatigue and tiredness.  Has some cough still.  Although recovering.  Physical Exam: General: in moderate distress, No Rash Cardiovascular: S1 and S2 Present, No Murmur Respiratory: Good respiratory effort, Bilateral Air entry present. No Crackles, No wheezes Abdomen: Bowel Sound present, No tenderness Extremities: No edema Neuro: Alert and oriented x3, no new focal deficit, appears anxious  Data Reviewed: I have Reviewed nursing notes, Vitals, and Lab results. Since last encounter, pertinent lab results CBC and BMP   . I have ordered test including CBC and BMP  . I have discussed pt's care plan and test results with cardiology  .   Disposition: Status is: Observation  apixaban  (ELIQUIS ) tablet 5 mg   Family Communication: No one at bedside Level of care: Progressive continue Vitals:   07/07/23 1427 07/07/23 1603 07/07/23 1610 07/07/23 1805  BP: (!) 155/106 (!) 170/119 (!) 162/104 (!) 164/104  Pulse: (!) 124 (!) 137 (!) 126 (!) 124  Resp: 20 (!) 22 (!) 22   Temp: 98.3 F (36.8 C)     TempSrc: Oral     SpO2: 96% 97% 97%   Weight: 85.4 kg     Height:         Author: Yetta Blanch, MD 07/07/2023 6:09 PM  Please look on [email or web address] to find out who is on call.

## 2023-07-07 NOTE — Progress Notes (Signed)
 At approximately 16:10, this RN and Waddell Pa, RN called to patient's bedside for suspected allergic reaction shortly following administration of Perflutren microspheres for echocardiogram.  Other unit RN staff already at bedside administering 100% oxygen via non-rebreather mask; Corean Sous, RN, Loyce Blazing, RN, Erla Prescott, RN and Lucie Shuck. RN.  Patient was initially unresponsive, but breathing on her own.  HR and BP grossly unchanged from previously filed values; atrial fibrillation with rapid ventricular response (rates 110 - 140, sustained).  Rapid response RN, Levon called and responded to floor.  Emergency orders received for diphenhydramine  25mg  IV x 1 and bolus IVF at wide-open rate.  Additional IV access was obtained also. Dr. Tobie made aware and responded to floor with additional orders at bedside.  At approximately 10 minutes, patient responding normally, though hypervigilant and anxious.  O2 was discontinued; not needed.  Metoprolol  5mg  IV x1 given per MD verbal order at bedside and bolus fluids stopped due to history of low EF.  Discussed with Drs. Tobie ([facility]) and [name] (cardiology) at RN station for debrief following events.  Additional orders are pending.  At this time, patient is resting comfortably in bed; stable.  HR does remain elevated at 115 - 120; a-fib.  Continuing to monitor closely.

## 2023-07-07 NOTE — Progress Notes (Signed)
 Echocardiogram 2D Echocardiogram has been performed.  Thea Norlander 07/07/2023, 4:25 PM

## 2023-07-07 NOTE — Plan of Care (Signed)

## 2023-07-08 ENCOUNTER — Ambulatory Visit: Attending: Cardiology | Admitting: Cardiovascular Disease

## 2023-07-08 DIAGNOSIS — I5021 Acute systolic (congestive) heart failure: Secondary | ICD-10-CM | POA: Diagnosis not present

## 2023-07-08 DIAGNOSIS — J45909 Unspecified asthma, uncomplicated: Secondary | ICD-10-CM | POA: Diagnosis not present

## 2023-07-08 DIAGNOSIS — Z79899 Other long term (current) drug therapy: Secondary | ICD-10-CM | POA: Diagnosis not present

## 2023-07-08 DIAGNOSIS — E785 Hyperlipidemia, unspecified: Secondary | ICD-10-CM | POA: Diagnosis not present

## 2023-07-08 DIAGNOSIS — T502X5S Adverse effect of carbonic-anhydrase inhibitors, benzothiadiazides and other diuretics, sequela: Secondary | ICD-10-CM | POA: Diagnosis not present

## 2023-07-08 DIAGNOSIS — T508X5A Adverse effect of diagnostic agents, initial encounter: Secondary | ICD-10-CM | POA: Diagnosis not present

## 2023-07-08 DIAGNOSIS — Z5986 Financial insecurity: Secondary | ICD-10-CM | POA: Diagnosis not present

## 2023-07-08 DIAGNOSIS — I110 Hypertensive heart disease with heart failure: Secondary | ICD-10-CM | POA: Diagnosis not present

## 2023-07-08 DIAGNOSIS — Z1152 Encounter for screening for COVID-19: Secondary | ICD-10-CM | POA: Diagnosis not present

## 2023-07-08 DIAGNOSIS — I495 Sick sinus syndrome: Secondary | ICD-10-CM | POA: Diagnosis not present

## 2023-07-08 DIAGNOSIS — Z7901 Long term (current) use of anticoagulants: Secondary | ICD-10-CM | POA: Diagnosis not present

## 2023-07-08 DIAGNOSIS — R404 Transient alteration of awareness: Secondary | ICD-10-CM | POA: Diagnosis not present

## 2023-07-08 DIAGNOSIS — I2510 Atherosclerotic heart disease of native coronary artery without angina pectoris: Secondary | ICD-10-CM | POA: Diagnosis present

## 2023-07-08 DIAGNOSIS — Z87891 Personal history of nicotine dependence: Secondary | ICD-10-CM | POA: Diagnosis not present

## 2023-07-08 DIAGNOSIS — Z950 Presence of cardiac pacemaker: Secondary | ICD-10-CM | POA: Diagnosis not present

## 2023-07-08 DIAGNOSIS — I5032 Chronic diastolic (congestive) heart failure: Secondary | ICD-10-CM | POA: Diagnosis not present

## 2023-07-08 DIAGNOSIS — I428 Other cardiomyopathies: Secondary | ICD-10-CM | POA: Diagnosis not present

## 2023-07-08 DIAGNOSIS — M320 Drug-induced systemic lupus erythematosus: Secondary | ICD-10-CM | POA: Diagnosis not present

## 2023-07-08 DIAGNOSIS — I480 Paroxysmal atrial fibrillation: Secondary | ICD-10-CM | POA: Diagnosis present

## 2023-07-08 DIAGNOSIS — F411 Generalized anxiety disorder: Secondary | ICD-10-CM | POA: Diagnosis present

## 2023-07-08 DIAGNOSIS — G4733 Obstructive sleep apnea (adult) (pediatric): Secondary | ICD-10-CM | POA: Diagnosis not present

## 2023-07-08 DIAGNOSIS — I4819 Other persistent atrial fibrillation: Secondary | ICD-10-CM | POA: Diagnosis not present

## 2023-07-08 DIAGNOSIS — I509 Heart failure, unspecified: Secondary | ICD-10-CM | POA: Diagnosis not present

## 2023-07-08 DIAGNOSIS — Z8673 Personal history of transient ischemic attack (TIA), and cerebral infarction without residual deficits: Secondary | ICD-10-CM | POA: Diagnosis not present

## 2023-07-08 DIAGNOSIS — I4891 Unspecified atrial fibrillation: Secondary | ICD-10-CM | POA: Diagnosis not present

## 2023-07-08 DIAGNOSIS — I5022 Chronic systolic (congestive) heart failure: Secondary | ICD-10-CM | POA: Diagnosis not present

## 2023-07-08 DIAGNOSIS — I951 Orthostatic hypotension: Secondary | ICD-10-CM | POA: Diagnosis present

## 2023-07-08 HISTORY — DX: Acute systolic (congestive) heart failure: I50.21

## 2023-07-08 LAB — BASIC METABOLIC PANEL WITH GFR
Anion gap: 9 (ref 5–15)
BUN: 16 mg/dL (ref 8–23)
CO2: 25 mmol/L (ref 22–32)
Calcium: 8.8 mg/dL — ABNORMAL LOW (ref 8.9–10.3)
Chloride: 105 mmol/L (ref 98–111)
Creatinine, Ser: 0.73 mg/dL (ref 0.44–1.00)
GFR, Estimated: >60 mL/min (ref >60)
Glucose, Bld: 101 mg/dL — ABNORMAL HIGH (ref 70–99)
Potassium: 4.2 mmol/L (ref 3.5–5.1)
Sodium: 139 mmol/L (ref 135–145)

## 2023-07-08 LAB — CBC
HCT: 42.3 % (ref 36.0–46.0)
Hemoglobin: 13.4 g/dL (ref 12.0–15.0)
MCH: 29 pg (ref 26.0–34.0)
MCHC: 31.7 g/dL (ref 30.0–36.0)
MCV: 91.6 fL (ref 80.0–100.0)
Platelets: 407 10*3/uL — ABNORMAL HIGH (ref 150–400)
RBC: 4.62 MIL/uL (ref 3.87–5.11)
RDW: 14.1 % (ref 11.5–15.5)
WBC: 16 10*3/uL — ABNORMAL HIGH (ref 4.0–10.5)
nRBC: 0 % (ref 0.0–0.2)

## 2023-07-08 LAB — MAGNESIUM: Magnesium: 2.2 mg/dL (ref 1.7–2.4)

## 2023-07-08 MED ORDER — METOPROLOL TARTRATE 5 MG/5ML IV SOLN: 5.0000 mg | INTRAVENOUS | Status: DC | PRN

## 2023-07-08 MED ORDER — LEVALBUTEROL HCL 1.25 MG/0.5ML IN NEBU
1.2500 mg | INHALATION_SOLUTION | Freq: Three times a day (TID) | RESPIRATORY_TRACT | Status: DC
  Filled 2023-07-08 (×3): qty 0.5

## 2023-07-08 MED ORDER — SODIUM CHLORIDE 0.9% FLUSH
3.0000 mL | Freq: Two times a day (BID) | INTRAVENOUS | Status: DC
  Administered 2023-07-08 – 2023-07-09 (×2): 3 mL via INTRAVENOUS

## 2023-07-08 MED ORDER — METOPROLOL SUCCINATE ER 100 MG PO TB24
100.0000 mg | ORAL_TABLET | Freq: Two times a day (BID) | ORAL | Status: DC
Start: 2023-07-08 — End: 2023-07-09
  Administered 2023-07-08 – 2023-07-09 (×2): 100 mg via ORAL
  Filled 2023-07-08 (×2): qty 1

## 2023-07-08 MED ORDER — SODIUM CHLORIDE 0.9% FLUSH: 3.0000 mL | INTRAVENOUS | Status: DC | PRN

## 2023-07-08 MED ORDER — HYDRALAZINE HCL 20 MG/ML IJ SOLN: 10.0000 mg | INTRAMUSCULAR | Status: DC | PRN

## 2023-07-08 MED ORDER — SENNOSIDES-DOCUSATE SODIUM 8.6-50 MG PO TABS: 1.0000 | ORAL_TABLET | Freq: Every evening | ORAL | Status: DC | PRN

## 2023-07-08 NOTE — Progress Notes (Signed)
   07/08/23 1133  Spiritual Encounters  Type of Visit Initial  Care provided to: Patient  Conversation partners present during encounter Physician  Referral source Clinical staff  Reason for visit Urgent spiritual support  OnCall Visit No  Spiritual Framework  Presenting Themes Values and beliefs;Meaning/purpose/sources of inspiration  Community/Connection Family  Patient Stress Factors Exhausted;Health changes;Major life changes  Interventions  Spiritual Care Interventions Made Prayer;Compassionate presence;Established relationship of care and support;Normalization of emotions  Intervention Outcomes  Outcomes Reduced anxiety;Reduced isolation;Reduced fear;Awareness of health;Awareness around self/spiritual resourses   Chaplain stopped by and listened as the Pt.reflected on her life and marital status. The Pt.appeared somewhat sad and depressed; However , after a time of prayer, her spirit seemed uplifted.  She expressed her eagerness to leave the hospital and return to her grandchildren. Chaplain gently remind her that God loves her and shared a scripture for encouragement.   Isaiah 46:4 Even to your old age and grey hairs I am he, I am he will sustain you. I have made you and I will carry you; I will sustain you and I will rescue you.  Chaplain services are available should Pt.desire further support.

## 2023-07-08 NOTE — Plan of Care (Signed)

## 2023-07-08 NOTE — TOC CM/SW Note (Signed)
 Transition of Care [facility]) - Inpatient Brief Assessment   Patient Details  Name: Shannon [name] MRN: [id number] Date of Birth: [birth year]  [facility] of [facility]) CM/SW Contact:    Sudie Erminio Deems, RN Phone Number: 07/08/2023, 12:05 PM   Clinical Narrative: Patient presented for atrial fib. PTA patient states she was independent from home with daughter and grandson. Patient states she has DME RW for night time use if needed. Patient has PCP and gets to appointments without any issues. No home needs identified during this visit. Case Manager will continue to follow for additional needs.    Transition of Care Asessment: Insurance and Status: Insurance coverage has been reviewed Patient has primary care physician: Yes Home environment has been reviewed: reviewed Prior level of function:: independent with RW assistance at night Prior/Current Home Services: No current home services Social Drivers of Health Review: SDOH reviewed no interventions necessary Readmission risk has been reviewed: Yes Transition of care needs: no transition of care needs at this time

## 2023-07-08 NOTE — Progress Notes (Addendum)
 Progress Note  Patient Name: Shannon [name] Date of Encounter: 07/08/2023 [facility] Cardiologist: None   Interval Summary   Patient resting when I entered the room States she is feeling better Some tightness in her chest that she attributes to her URI She has not had her CPAP yet this admission will place order for her to have one at night  She is also worried about PNA, will order incentive spirometer for her   Vital Signs Vitals:   07/08/23 0021 07/08/23 0310 07/08/23 0735 07/08/23 0830  BP: (!) 163/115 (!) 154/112 (!) 166/100   Pulse:  81  89  Resp: 18  20   Temp:  98.4 F (36.9 C) 98.7 F (37.1 C)   TempSrc:  Oral Oral   SpO2:  94%  96%  Weight:      Height:        Intake/Output Summary (Last 24 hours) at 07/08/2023 0830 Last data filed at 07/07/2023 1900 Gross per 24 hour  Intake 548.15 ml  Output --  Net 548.15 ml      07/07/2023    2:27 PM 07/06/2023    3:12 PM 06/09/2023   12:49 PM  Last 3 Weights  Weight (lbs) 188 lb 4.8 oz 210 lb 194 lb 9.6 oz  Weight (kg) 85.412 kg 95.255 kg 88.27 kg     Telemetry/ECG  Atrial fibrillation HR 80-90s - Personally Reviewed  Physical Exam  GEN: No acute distress.   Neck: No JVD Cardiac: irregularly irregular, no murmurs, rubs, or gallops.  Respiratory: Clear to auscultation bilaterally. GI: Soft, nontender, non-distended  MS: No edema  Assessment & Plan  Shannon [name] is a 73 y.o. female with SSS, persistent AF/RVR c/b post conversion pauses s/p LEFT MDT DC/LBaP PPM (DOI 03/09/23, Shannon [name]), HTN, OSA, drug-induced SLE, prior stroke and anxiety who presents with recurrent AF/RVR   Persistent atrial fibrillation with episodes of RVR Sick sinus syndrome s/p PPM 03/2023 Device interrogation showed she entered A. Fib 6/30 Unsuccessful cardioversion x 2 in ED, HR remains in 110-130s Given IV diltiazem  in ED, stopped due to reduced EF Currently in A. 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  Unsuccessful DCCV in ED.  Amiodarone  would be best option but she declines as she had intolerance to this in the past.  She has also failed multiple ablations and Tikosyn .  Options appear limited, may ultimately be headed for AV nodal ablation.  Will discuss with EP  HFrEF Hypertension  Echo this admission showed: LVEF 30-35%, mild LVH, normal RV function, biatrial enlargement, normal IVC Continue PO Lasix  20 mg daily Continue Losartan  25 mg daily Continue Lopressor  50 mg Q6, will consolidate to Toprol -XL on discharge  Hyperlipidemia  10/15/2022: HDL 60; LDL Chol Calc (NIH) 62 07/01/2023: ALT 13  Continue simvastatin  20 mg daily   Per primary  History of CVA OSA on CPAP Asthma Anxiety  Recen t URI    For questions or updates, please contact [facility] Please consult [email or web address] for contact info under       Signed, Shannon DELENA Donath, PA-C    Patient seen and examined.  Agree with above documentation.  On exam, patient is alert and oriented, tachycardic, irregular no murmurs, lungs CTAB, no LE edema or JVD.  Review of telemetry shows A-fib with rates 90s  to 120s.  She is on Lopressor  50 mg every 6 hours.  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  Would plan to start amiodarone  load and then retry cardioversion, but she declines amiodarone  as did not tolerate in the past.  She has also failed multiple ablations and Tikosyn .  Will consult EP, may ultimately need AV nodal ablation.  Shannon LITTIE Nanas, MD

## 2023-07-08 NOTE — Progress Notes (Signed)
Placed patient on CPAP for the night via auto-mode.  

## 2023-07-08 NOTE — Progress Notes (Signed)
 PROGRESS NOTE    Shannon [name]  FMW:[id number] DOB: [birth year] DOA: 07/06/2023 PCP: No primary care provider on file.    Brief Narrative:  73 year old with history of paroxysmal A-fib on Eliquis  failed ablation, chronic systolic CHF EF 35%, anxiety, depression, OSA on CPAP presented with lightheadedness and palpitation.  Recently recovering from upper respiratory illness.  Found to be in atrial fibrillation with RVR, cardiology team consulted.   Assessment & Plan:  Principal Problem:   Rapid atrial fibrillation (HCC) Active Problems:   OSA (obstructive sleep apnea)   Asthma   History of CVA (cerebrovascular accident)   GAD (generalized anxiety disorder)   Chronic HFrEF (heart failure with reduced ejection fraction) (HCC)    Paroxysmal A-fib with RVR. Seen by cardiology team, uptitrating p.o. medications as they have been off Cardizem  drip Continue Eliquis    Chronic systolic CHF. Volume level adequate.  EF on recent nuc med study 45%, EF on echo 35% Continue home medication, GDMT per cardiology   History of CVA. Was on Zocor    OSA. Continue CPAP nightly.   History of asthma. Currently no evidence of exacerbation. Monitor.   Anxiety. Home regimen includes BuSpar  5 mg twice daily.   Recent upper airway infection. Supportive care.  On doxycycline , EOT 7/3 As needed bronchodilators   Unresponsive event after receiving Definity. Question of intolerance to Definity contrast.  Added to the allergy list by the previous provider.  No signs of respiratory compromise  DVT prophylaxis: apixaban  (ELIQUIS ) tablet 5 mg     Code Status: Full Code Family Communication:   Ongoing management per cardiology for atrial fibrillation with RVR    Subjective:  During my visit patient remains in atrial fibrillation with RVR heart rate ranging from 110-130. No other symptoms  Examination:  General exam: Appears calm and comfortable  Respiratory system: Clear to  auscultation. Respiratory effort normal. Cardiovascular system: Irregularly irregular heart Gastrointestinal system: Abdomen is nondistended, soft and nontender. No organomegaly or masses felt. Normal bowel sounds heard. Central nervous system: Alert and oriented. No focal neurological deficits. Extremities: Symmetric 5 x 5 power. Skin: No rashes, lesions or ulcers Psychiatry: Judgement and insight appear normal. Mood & affect appropriate.                Diet Orders (From admission, onward)     Start     Ordered   07/06/23 2337  Diet regular Room service appropriate? Yes; Fluid consistency: Thin  Diet effective now       Question Answer Comment  Room service appropriate? Yes   Fluid consistency: Thin      07/06/23 2336            Objective: Vitals:   07/08/23 0310 07/08/23 0735 07/08/23 0830 07/08/23 1153  BP: (!) 154/112 (!) 166/100  (!) 142/111  Pulse: 81 89 89   Resp:  20  (!) 21  Temp: 98.4 F (36.9 C) 98.7 F (37.1 C)  97.9 F (36.6 C)  TempSrc: Oral Oral  Oral  SpO2: 94% 97% 96%   Weight:      Height:        Intake/Output Summary (Last 24 hours) at 07/08/2023 1335 Last data filed at 07/08/2023 1155 Gross per 24 hour  Intake 890 ml  Output --  Net 890 ml   Filed Weights   07/06/23 1512 07/07/23 1427  Weight: 95.3 kg 85.4 kg    Scheduled Meds:  apixaban   5 mg Oral BID   benzonatate   100 mg Oral  TID   busPIRone   5 mg Oral BID   doxycycline   100 mg Oral BID   furosemide   20 mg Oral Daily   levalbuterol   1.25 mg Nebulization Q8H   losartan   25 mg Oral Daily   metoprolol  succinate  100 mg Oral BID   simvastatin   20 mg Oral Daily   sodium chloride  flush  3 mL Intravenous Q12H   Continuous Infusions:  Nutritional status     Body mass index is 29.49 kg/m.  Data Reviewed:   CBC: Recent Labs  Lab 07/01/23 1610 07/06/23 1545 07/07/23 0430 07/08/23 0356  WBC 11.1* 13.8* 14.1* 16.0*  NEUTROABS 7.9*  --   --   --   HGB 10.9* 12.1 12.5  13.4  HCT 33.4* 37.6 39.5 42.3  MCV 90.8 90.0 92.5 91.6  PLT 271 421* 398 407*   Basic Metabolic Panel: Recent Labs  Lab 07/01/23 1610 07/06/23 1545 07/07/23 0430 07/08/23 0356  NA 136 137 136 139  K 3.8 4.7 3.4* 4.2  CL 99 98 99 105  CO2 30 26 29 25   GLUCOSE 94 114* 109* 101*  BUN 11 18 15 16   CREATININE 0.66 0.67 0.72 0.73  CALCIUM  9.5 9.0 8.6* 8.8*  MG  --   --  2.2 2.2   GFR: Estimated Creatinine Clearance: 71.3 mL/min (by C-G formula based on SCr of 0.73 mg/dL). Liver Function Tests: Recent Labs  Lab 07/01/23 1610  AST 19  ALT 13  ALKPHOS 93  BILITOT 0.6  PROT 7.1  ALBUMIN 3.8   No results for input(s): LIPASE, AMYLASE in the last 168 hours. No results for input(s): AMMONIA in the last 168 hours. Coagulation Profile: No results for input(s): INR, PROTIME in the last 168 hours. Cardiac Enzymes: No results for input(s): CKTOTAL, CKMB, CKMBINDEX, TROPONINI in the last 168 hours. BNP (last 3 results) Recent Labs    07/01/23 1610  PROBNP 3,772.0*   HbA1C: No results for input(s): HGBA1C in the last 72 hours. CBG: No results for input(s): GLUCAP in the last 168 hours. Lipid Profile: No results for input(s): CHOL, HDL, LDLCALC, TRIG, CHOLHDL, LDLDIRECT in the last 72 hours. Thyroid  Function Tests: No results for input(s): TSH, T4TOTAL, FREET4, T3FREE, THYROIDAB in the last 72 hours. Anemia Panel: No results for input(s): VITAMINB12, FOLATE, FERRITIN, TIBC, IRON , RETICCTPCT in the last 72 hours. Sepsis Labs: No results for input(s): PROCALCITON, LATICACIDVEN in the last 168 hours.  Recent Results (from the past 240 hours)  Resp panel by RT-PCR (RSV, Flu A&B, Covid) Anterior Nasal Swab     Status: None   Collection Time: 07/01/23  2:45 PM   Specimen: Anterior Nasal Swab  Result Value Ref Range Status   SARS Coronavirus 2 by RT PCR NEGATIVE NEGATIVE Final    Comment: (NOTE) SARS-CoV-2 target  nucleic acids are NOT DETECTED.  The SARS-CoV-2 RNA is generally detectable in upper respiratory specimens during the acute phase of infection. The lowest concentration of SARS-CoV-2 viral copies this assay can detect is 138 copies/mL. A negative result does not preclude SARS-Cov-2 infection and should not be used as the sole basis for treatment or other patient management decisions. A negative result may occur with  improper specimen collection/handling, submission of specimen other than nasopharyngeal swab, presence of viral mutation(s) within the areas targeted by this assay, and inadequate number of viral copies(<138 copies/mL). A negative result must be combined with clinical observations, patient history, and epidemiological information. The expected result is Negative.  Fact Sheet  for Patients:  BloggerCourse.com  Fact Sheet for Healthcare Providers:  SeriousBroker.it  This test is no t yet approved or cleared by the [place]  FDA and  has been authorized for detection and/or diagnosis of SARS-CoV-2 by FDA under an Emergency Use Authorization (EUA). This EUA will remain  in effect (meaning this test can be used) for the duration of the COVID-19 declaration under Section 564(b)(1) of the Act, 21 U.S.C.section 360bbb-3(b)(1), unless the authorization is terminated  or revoked sooner.       Influenza A by PCR NEGATIVE NEGATIVE Final   Influenza B by PCR NEGATIVE NEGATIVE Final    Comment: (NOTE) The Xpert Xpress SARS-CoV-2/FLU/RSV plus assay is intended as an aid in the diagnosis of influenza from Nasopharyngeal swab specimens and should not be used as a sole basis for treatment. Nasal washings and aspirates are unacceptable for Xpert Xpress SARS-CoV-2/FLU/RSV testing.  Fact Sheet for Patients: BloggerCourse.com  Fact Sheet for Healthcare  Providers: SeriousBroker.it  This test is not yet approved or cleared by the [place]  FDA and has been authorized for detection and/or diagnosis of SARS-CoV-2 by FDA under an Emergency Use Authorization (EUA). This EUA will remain in effect (meaning this test can be used) for the duration of the COVID-19 declaration under Section 564(b)(1) of the Act, 21 U.S.C. section 360bbb-3(b)(1), unless the authorization is terminated or revoked.     Resp Syncytial Virus by PCR NEGATIVE NEGATIVE Final    Comment: (NOTE) Fact Sheet for Patients: BloggerCourse.com  Fact Sheet for Healthcare Providers: SeriousBroker.it  This test is not yet approved or cleared by the [place]  FDA and has been authorized for detection and/or diagnosis of SARS-CoV-2 by FDA under an Emergency Use Authorization (EUA). This EUA will remain in effect (meaning this test can be used) for the duration of the COVID-19 declaration under Section 564(b)(1) of the Act, 21 U.S.C. section 360bbb-3(b)(1), unless the authorization is terminated or revoked.  Performed at [facility], [address], [place], [place] [address]          Radiology Studies: ECHOCARDIOGRAM COMPLETE Result Date: 07/07/2023    ECHOCARDIOGRAM REPORT   Patient Name:   KARNA HENRY BLUSH Date of Exam: 07/07/2023 Medical Rec #:  [id number]             Height:       67.0 in Accession #:    [id number]            Weight:       210.0 lb Date of Birth:  [birth year]              BSA:          2.065 m Patient Age:    73 years              BP:           146/105 mmHg Patient Gender: F                     HR:           126 bpm. Exam Location:  Inpatient Procedure: 2D Echo, Cardiac Doppler, Color Doppler and Intracardiac            Opacification Agent (Both Spectral and Color Flow Doppler were            utilized during procedure). Indications:    Atrial  Fibrillation I48.91  History:        Patient has prior history of Echocardiogram  examinations, most                 recent 10/23/2022. CHF, Stroke, Arrythmias:Atrial Fibrillation,                 Atrial Flutter, Tachycardia and Bradycardia,                 Signs/Symptoms:Hypotension and Shortness of Breath; Risk                 Factors:Dyslipidemia, Hypertension and Sleep Apnea.  Sonographer:    Thea Norlander RCS Referring Phys: [id number] CHRISTOPHER L SCHUMANN IMPRESSIONS  1. Left ventricular ejection fraction, by estimation, is 30 to 35%. The left ventricle has moderately decreased function. The left ventricle demonstrates global hypokinesis. There is mild concentric left ventricular hypertrophy. Left ventricular diastolic function could not be evaluated.  2. Right ventricular systolic function is normal. The right ventricular size is normal. Tricuspid regurgitation signal is inadequate for assessing PA pressure.  3. Left atrial size was moderately dilated.  4. Right atrial size was moderately dilated.  5. The mitral valve is normal in structure. No evidence of mitral valve regurgitation. No evidence of mitral stenosis.  6. The aortic valve is tricuspid. Aortic valve regurgitation is not visualized. No aortic stenosis is present.  7. The inferior vena cava is normal in size with greater than 50% respiratory variability, suggesting right atrial pressure of 3 mmHg. Comparison(s): Prior images reviewed side by side. The left ventricular function is significantly worse. This may be due to uncontrolled tachyarrhythmia. FINDINGS  Left Ventricle: Left ventricular ejection fraction, by estimation, is 30 to 35%. The left ventricle has moderately decreased function. The left ventricle demonstrates global hypokinesis. Definity contrast agent was given IV to delineate the left ventricular endocardial borders. The left ventricular internal cavity size was normal in size. There is mild concentric left ventricular hypertrophy.  Left ventricular diastolic function could not be evaluated due to atrial fibrillation. Left ventricular diastolic function could not be evaluated. Right Ventricle: The right ventricular size is normal. Right vetricular wall thickness was not well visualized. Right ventricular systolic function is normal. Tricuspid regurgitation signal is inadequate for assessing PA pressure. Left Atrium: Left atrial size was moderately dilated. Right Atrium: Right atrial size was moderately dilated. Pericardium: There is no evidence of pericardial effusion. Mitral Valve: The mitral valve is normal in structure. Mild mitral annular calcification. No evidence of mitral valve regurgitation. No evidence of mitral valve stenosis. Tricuspid Valve: The tricuspid valve is grossly normal. Tricuspid valve regurgitation is not demonstrated. Aortic Valve: The aortic valve is tricuspid. Aortic valve regurgitation is not visualized. No aortic stenosis is present. Aortic valve peak gradient measures 4.7 mmHg. Pulmonic Valve: The pulmonic valve was grossly normal. Pulmonic valve regurgitation is not visualized. No evidence of pulmonic stenosis. Aorta: The aortic root is normal in size and structure. Venous: The inferior vena cava is normal in size with greater than 50% respiratory variability, suggesting right atrial pressure of 3 mmHg. IAS/Shunts: The interatrial septum was not assessed.  LEFT VENTRICLE PLAX 2D LVIDd:         5.10 cm LVIDs:         3.90 cm LV PW:         1.20 cm LV IVS:        1.10 cm LVOT diam:     2.20 cm LV SV:         33 LV SV Index:   16 LVOT Area:  3.80 cm  RIGHT VENTRICLE             IVC RV S prime:     14.00 cm/s  IVC diam: 1.50 cm TAPSE (M-mode): 2.5 cm LEFT ATRIUM             Index        RIGHT ATRIUM           Index LA diam:        5.00 cm 2.42 cm/m   RA Area:     22.50 cm LA Vol (A2C):   72.5 ml 35.12 ml/m  RA Volume:   70.60 ml  34.20 ml/m LA Vol (A4C):   59.5 ml 28.82 ml/m LA Biplane Vol: 65.2 ml 31.58 ml/m   AORTIC VALVE AV Area (Vmax): 2.22 cm AV Vmax:        108.00 cm/s AV Peak Grad:   4.7 mmHg LVOT Vmax:      62.93 cm/s LVOT Vmean:     41.267 cm/s LVOT VTI:       0.087 m  AORTA Ao Root diam: 3.20 cm Ao Asc diam:  3.80 cm MITRAL VALVE MV Area (PHT): 4.96 cm    SHUNTS MV Decel Time: 153 msec    Systemic VTI:  0.09 m MV E velocity: 69.90 cm/s  Systemic Diam: 2.20 cm Jerel Croitoru MD Electronically signed by Jerel Balding MD Signature Date/Time: 07/07/2023/5:01:11 PM    Final    DG Chest 2 View Result Date: 07/06/2023 CLINICAL DATA:  Chest pain palpitation EXAM: CHEST - 2 VIEW COMPARISON:  07/01/2023 FINDINGS: Left-sided pacing device as before. No acute airspace disease or effusion. Stable cardiomediastinal silhouette with aortic atherosclerosis. IMPRESSION: No active cardiopulmonary disease. Electronically Signed   By: Luke Bun M.D.   On: 07/06/2023 16:49           LOS: 0 days   Time spent= 35 mins    Burgess JAYSON Dare, MD [facility]  If 7PM-7AM, please contact night-coverage  07/08/2023, 1:35 PM
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
## 2023-07-08 NOTE — Hospital Course (Addendum)
 Brief Narrative:  73 year old with history of paroxysmal A-fib on Eliquis  failed ablation, chronic systolic CHF EF 35%, anxiety, depression, OSA on CPAP presented with lightheadedness and palpitation.  Recently recovering from upper respiratory illness.  Found to be in atrial fibrillation with RVR, cardiology team consulted.   Assessment & Plan:  Principal Problem:   Rapid atrial fibrillation (HCC) Active Problems:   OSA (obstructive sleep apnea)   Asthma   History of CVA (cerebrovascular accident)   GAD (generalized anxiety disorder)   Chronic HFrEF (heart failure with reduced ejection fraction) (HCC)    Paroxysmal A-fib with RVR. Seen by cardiology team, uptitrating p.o. medications as they have been off Cardizem  drip Continue Eliquis    Chronic systolic CHF. Volume level adequate.  EF on recent nuc med study 45%, EF on echo 35% Continue home medication, GDMT per cardiology   History of CVA. Was on Zocor    OSA. Continue CPAP nightly.   History of asthma. Currently no evidence of exacerbation. Monitor.   Anxiety. Home regimen includes BuSpar  5 mg twice daily.   Recent upper airway infection. Supportive care.  On doxycycline , EOT 7/3 As needed bronchodilators   Unresponsive event after receiving Definity. Question of intolerance to Definity contrast.  Added to the allergy list by the previous provider.  No signs of respiratory compromise  DVT prophylaxis: apixaban  (ELIQUIS ) tablet 5 mg     Code Status: Full Code Family Communication:   Ongoing management per cardiology for atrial fibrillation with RVR    Subjective:  During my visit patient remains in atrial fibrillation with RVR heart rate ranging from 110-130. No other symptoms  Examination:  General exam: Appears calm and comfortable  Respiratory system: Clear to auscultation. Respiratory effort normal. Cardiovascular system: Irregularly irregular heart Gastrointestinal system: Abdomen is nondistended,  soft and nontender. No organomegaly or masses felt. Normal bowel sounds heard. Central nervous system: Alert and oriented. No focal neurological deficits. Extremities: Symmetric 5 x 5 power. Skin: No rashes, lesions or ulcers Psychiatry: Judgement and insight appear normal. Mood & affect appropriate.

## 2023-07-09 ENCOUNTER — Ambulatory Visit: Admitting: Behavioral Health

## 2023-07-09 ENCOUNTER — Inpatient Hospital Stay (HOSPITAL_COMMUNITY): Admitting: Anesthesiology

## 2023-07-09 ENCOUNTER — Other Ambulatory Visit (HOSPITAL_COMMUNITY): Payer: Self-pay

## 2023-07-09 ENCOUNTER — Other Ambulatory Visit: Payer: Self-pay

## 2023-07-09 ENCOUNTER — Encounter: Payer: Self-pay | Admitting: Cardiovascular Disease

## 2023-07-09 ENCOUNTER — Encounter (HOSPITAL_COMMUNITY)
Admission: EM | Disposition: A | Discharge status: Home / Self Care | Payer: Self-pay | Source: Home / Self Care | Attending: Internal Medicine

## 2023-07-09 DIAGNOSIS — I4891 Unspecified atrial fibrillation: Secondary | ICD-10-CM

## 2023-07-09 DIAGNOSIS — I110 Hypertensive heart disease with heart failure: Secondary | ICD-10-CM

## 2023-07-09 DIAGNOSIS — I5032 Chronic diastolic (congestive) heart failure: Secondary | ICD-10-CM

## 2023-07-09 DIAGNOSIS — G4733 Obstructive sleep apnea (adult) (pediatric): Secondary | ICD-10-CM | POA: Diagnosis not present

## 2023-07-09 HISTORY — PX: CARDIOVERSION: EP1203

## 2023-07-09 LAB — BASIC METABOLIC PANEL WITH GFR
Anion gap: 7 (ref 5–15)
BUN: 17 mg/dL (ref 8–23)
CO2: 26 mmol/L (ref 22–32)
Calcium: 8.7 mg/dL — ABNORMAL LOW (ref 8.9–10.3)
Chloride: 100 mmol/L (ref 98–111)
Creatinine, Ser: 0.97 mg/dL (ref 0.44–1.00)
GFR, Estimated: >60 mL/min (ref >60)
Glucose, Bld: 105 mg/dL — ABNORMAL HIGH (ref 70–99)
Potassium: 3.9 mmol/L (ref 3.5–5.1)
Sodium: 133 mmol/L — ABNORMAL LOW (ref 135–145)

## 2023-07-09 LAB — CBC
HCT: 41.5 % (ref 36.0–46.0)
Hemoglobin: 13.1 g/dL (ref 12.0–15.0)
MCH: 28.9 pg (ref 26.0–34.0)
MCHC: 31.6 g/dL (ref 30.0–36.0)
MCV: 91.6 fL (ref 80.0–100.0)
Platelets: 344 10*3/uL (ref 150–400)
RBC: 4.53 MIL/uL (ref 3.87–5.11)
RDW: 14 % (ref 11.5–15.5)
WBC: 15 10*3/uL — ABNORMAL HIGH (ref 4.0–10.5)
nRBC: 0 % (ref 0.0–0.2)

## 2023-07-09 LAB — PHOSPHORUS: Phosphorus: 3.7 mg/dL (ref 2.5–4.6)

## 2023-07-09 LAB — MAGNESIUM: Magnesium: 2.2 mg/dL (ref 1.7–2.4)

## 2023-07-09 SURGERY — CARDIOVERSION (CATH LAB)
Anesthesia: General

## 2023-07-09 MED ORDER — PROPOFOL 10 MG/ML IV BOLUS
INTRAVENOUS | Status: DC | PRN
  Administered 2023-07-09: 35 mg via INTRAVENOUS

## 2023-07-09 MED ORDER — METOPROLOL SUCCINATE ER 100 MG PO TB24
100.0000 mg | ORAL_TABLET | Freq: Two times a day (BID) | ORAL | 0 refills | Status: DC
  Filled 2023-07-09: qty 60, 30d supply, fill #0

## 2023-07-09 MED ORDER — LIDOCAINE HCL (PF) 2 % IJ SOLN
INTRAMUSCULAR | Status: DC | PRN
  Administered 2023-07-09: 40 mg via INTRADERMAL

## 2023-07-09 SURGICAL SUPPLY — 1 items: PAD DEFIB RADIO PHYSIO CONN (PAD) ×1 IMPLANT

## 2023-07-09 NOTE — Transfer of Care (Signed)
 Immediate Anesthesia Transfer of Care Note  Patient: Shannon [name]  Procedure(s) Performed: CARDIOVERSION  Patient Location: Cath Lab  Anesthesia Type:General  Level of Consciousness: awake, drowsy, and patient cooperative  Airway & Oxygen Therapy: Patient Spontanous Breathing and Patient connected to nasal cannula oxygen  Post-op Assessment: Report given to RN and Post -op Vital signs reviewed and stable  Post vital signs: Reviewed and stable  Last Vitals:  Vitals Value Taken Time  BP 138/87 0924  Temp    Pulse 64 0924  Resp 14 0924  SpO2 97% 0924    Last Pain:  Vitals:   07/09/23 0830  TempSrc: Temporal  PainSc: 5       Patients Stated Pain Goal: 0 (07/07/23 1427)  Complications: No notable events documented.

## 2023-07-09 NOTE — Anesthesia Postprocedure Evaluation (Signed)
 Anesthesia Post Note  Patient: Shannon [name]  Procedure(s) Performed: CARDIOVERSION     Patient location during evaluation: PACU Anesthesia Type: General Level of consciousness: awake and alert Pain management: pain level controlled Vital Signs Assessment: post-procedure vital signs reviewed and stable Respiratory status: spontaneous breathing, nonlabored ventilation, respiratory function stable and patient connected to nasal cannula oxygen Cardiovascular status: blood pressure returned to baseline and stable Postop Assessment: no apparent nausea or vomiting Anesthetic complications: no   No notable events documented.  Last Vitals:  Vitals:   07/09/23 0940 07/09/23 1024  BP: 135/74 (!) 154/83  Pulse: 60 66  Resp: 13 16  Temp:  36.7 C  SpO2: 96%     Last Pain:  Vitals:   07/09/23 1024  TempSrc: Oral  PainSc:                  Thom JONELLE Peoples

## 2023-07-09 NOTE — Discharge Summary (Signed)
 Physician Discharge Summary  Julizza Sassone FMW:[id number] DOB: 10-31-1950 DOA: 07/06/2023  PCP: Knute [name], FNP  Admit date: 07/06/2023 Discharge date: 07/09/2023  Admitted From: Home Disposition:  Home  Recommendations for Outpatient Follow-up:  Follow up with PCP in 1-2 weeks Please obtain BMP/CBC in one week your next doctors visit.  Started Toprol  100mg  PO BID by cardiology. Stopped Coreg .    Discharge Condition: Stable CODE STATUS: Full Diet recommendation:  Cardiac  Brief/Interim Summary: Brief Narrative:  73 year old with history of paroxysmal A-fib on Eliquis  failed ablation, chronic systolic CHF EF 35%, anxiety, depression, OSA on CPAP presented with lightheadedness and palpitation.  Recently recovering from upper respiratory illness.  Found to be in atrial fibrillation with RVR, cardiology team consulted.  Eventually EP team was consulted, patient underwent successful cardioversion on 7/3.  Once cleared by cardiology will discharge the patient   Assessment & Plan:  Principal Problem:   Rapid atrial fibrillation (HCC) Active Problems:   OSA (obstructive sleep apnea)   Asthma   History of CVA (cerebrovascular accident)   GAD (generalized anxiety disorder)   Chronic HFrEF (heart failure with reduced ejection fraction) (HCC)    Paroxysmal A-fib with RVR. Seen by cardiology, underwent successful cardioversion on 7/3.  Currently normal sinus rhythm   Chronic systolic CHF. Volume level adequate.  EF on recent nuc med study 45%, EF on echo 35% Continue home medication, GDMT per cardiology   History of CVA. Was on Zocor    OSA. Continue CPAP nightly.   History of asthma. Currently no evidence of exacerbation. Monitor.   Anxiety. Home regimen includes BuSpar  5 mg twice daily.   Recent upper airway infection. Supportive care.  On doxycycline , EOT 7/3 As needed bronchodilators   Unresponsive event after receiving Definity. Question of intolerance  to Definity contrast.  Added to the allergy list by the previous provider.  No signs of respiratory compromise  DVT prophylaxis: apixaban  (ELIQUIS ) tablet 5 mg     Code Status: Full Code Family Communication:   Discharge once cleared by cardiology   Subjective:  Seen post cardioversion, tolerated well.  Examination:  General exam: Appears calm and comfortable  Respiratory system: Clear to auscultation. Respiratory effort normal. Cardiovascular system: NSR Gastrointestinal system: Abdomen is nondistended, soft and nontender. No organomegaly or masses felt. Normal bowel sounds heard. Central nervous system: Alert and oriented. No focal neurological deficits. Extremities: Symmetric 5 x 5 power. Skin: No rashes, lesions or ulcers Psychiatry: Judgement and insight appear normal. Mood & affect appropriate.    Discharge Diagnoses:  Principal Problem:   Rapid atrial fibrillation (HCC) Active Problems:   OSA (obstructive sleep apnea)   Atrial fibrillation with rapid ventricular response (HCC)   Asthma   History of CVA (cerebrovascular accident)   GAD (generalized anxiety disorder)   Chronic HFrEF (heart failure with reduced ejection fraction) (HCC)   Acute systolic heart failure (HCC)      Discharge Exam: Vitals:   07/09/23 0940 07/09/23 1024  BP: 135/74 (!) 154/83  Pulse: 60 66  Resp: 13 16  Temp:  98.1 F (36.7 C)  SpO2: 96%    Vitals:   07/09/23 0929 07/09/23 0930 07/09/23 0940 07/09/23 1024  BP: 111/68 111/68 135/74 (!) 154/83  Pulse: 60 61 60 66  Resp: 20 14 13 16   Temp:    98.1 F (36.7 C)  TempSrc:    Oral  SpO2: 96% 97% 96%   Weight:      Height:  Discharge Instructions  Discharge Instructions     Amb referral to AFIB Clinic   Complete by: As directed       Allergies as of 07/09/2023       Reactions   Definity [perflutren Lipid Microsphere] Other (See Comments)   Passed out.    Sulfur Nausea And Vomiting   Ace Inhibitors Cough    Amiodarone  Other (See Comments)   Intolerance multiple side effects   Hctz [hydrochlorothiazide ] Other (See Comments)   Caused drug-induced LUPUS   Oxycodone Other (See Comments)   Hallucinations   Prednisone  Other (See Comments)   Made patient very aggressive   Sulfa Antibiotics Nausea And Vomiting   Voltaren [diclofenac Sodium] Other (See Comments)   Made patient become aggressive        Medication List     STOP taking these medications    carvedilol  6.25 MG tablet Commonly known as: COREG    doxycycline  100 MG tablet Commonly known as: VIBRA -TABS   methylPREDNISolone  4 MG Tbpk tablet Commonly known as: MEDROL  DOSEPAK       TAKE these medications    acetaminophen  500 MG tablet Commonly known as: TYLENOL  Take 1,000 mg by mouth every 6 (six) hours as needed for moderate pain or headache.   benzonatate  100 MG capsule Commonly known as: TESSALON  Take 1 capsule (100 mg total) by mouth 3 (three) times daily as needed for cough.   busPIRone  5 MG tablet Commonly known as: BUSPAR  Take 1 tablet (5 mg total) by mouth 2 (two) times daily.   CORTIZONE-10 EX Apply 1 application  topically as needed (skin irritation/itching).   Eliquis  5 MG Tabs tablet Generic drug: apixaban  TAKE 1 TABLET BY MOUTH TWICE  DAILY   furosemide  20 MG tablet Commonly known as: LASIX  TAKE 1 TABLET BY MOUTH DAILY   levalbuterol  45 MCG/ACT inhaler Commonly known as: Xopenex  HFA Inhale 2 puffs into the lungs every 4 (four) hours as needed for wheezing.   loratadine 10 MG tablet Commonly known as: CLARITIN Take 10 mg by mouth daily as needed for allergies.   losartan  25 MG tablet Commonly known as: COZAAR  Take 1 tablet (25 mg total) by mouth daily. What changed: when to take this   metoprolol  succinate 100 MG 24 hr tablet Commonly known as: TOPROL -XL Take 1 tablet (100 mg total) by mouth 2 (two) times daily. Take with or immediately following a meal.   multivitamin tablet Take 1  tablet by mouth at bedtime.   Potassium Chloride  ER 20 MEQ Tbcr Take 1 tablet (20 mEq total) by mouth daily.   simvastatin  20 MG tablet Commonly known as: ZOCOR  TAKE 1 TABLET BY MOUTH DAILY What changed: when to take this   Vitamin D  50 MCG (2000 UT) Caps Take 2,000 Units by mouth in the morning.        Allergies  Allergen Reactions   Definity [Perflutren Lipid Microsphere] Other (See Comments)    Passed out.    Sulfur Nausea And Vomiting   Ace Inhibitors Cough   Amiodarone  Other (See Comments)    Intolerance multiple side effects   Hctz [Hydrochlorothiazide ] Other (See Comments)    Caused drug-induced LUPUS   Oxycodone Other (See Comments)    Hallucinations   Prednisone  Other (See Comments)    Made patient very aggressive   Sulfa Antibiotics Nausea And Vomiting   Voltaren [Diclofenac Sodium] Other (See Comments)    Made patient become aggressive    You were cared for by a hospitalist during your  hospital stay. If you have any questions about your discharge medications or the care you received while you were in the hospital after you are discharged, you can call the unit and asked to speak with the hospitalist on call if the hospitalist that took care of you is not available. Once you are discharged, your primary care physician will handle any further medical issues. Please note that no refills for any discharge medications will be authorized once you are discharged, as it is imperative that you return to your primary care physician (or establish a relationship with a primary care physician if you do not have one) for your aftercare needs so that they can reassess your need for medications and monitor your lab values.  You were cared for by a hospitalist during your hospital stay. If you have any questions about your discharge medications or the care you received while you were in the hospital after you are discharged, you can call the unit and asked to speak with the  hospitalist on call if the hospitalist that took care of you is not available. Once you are discharged, your primary care physician will handle any further medical issues. Please note that NO REFILLS for any discharge medications will be authorized once you are discharged, as it is imperative that you return to your primary care physician (or establish a relationship with a primary care physician if you do not have one) for your aftercare needs so that they can reassess your need for medications and monitor your lab values.  Please request your Prim.MD to go over all Hospital Tests and Procedure/Radiological results at the follow up, please get all Hospital records sent to your Prim MD by signing hospital release before you go home.  Get CBC, CMP, 2 view Chest X ray checked  by Primary MD during your next visit or SNF MD in 5-7 days ( we routinely change or add medications that can affect your baseline labs and fluid status, therefore we recommend that you get the mentioned basic workup next visit with your PCP, your PCP may decide not to get them or add new tests based on their clinical decision)  On your next visit with your primary care physician please Get Medicines reviewed and adjusted.  If you experience worsening of your admission symptoms, develop shortness of breath, life threatening emergency, suicidal or homicidal thoughts you must seek medical attention immediately by calling 911 or calling your MD immediately  if symptoms less severe.  You Must read complete instructions/literature along with all the possible adverse reactions/side effects for all the Medicines you take and that have been prescribed to you. Take any new Medicines after you have completely understood and accpet all the possible adverse reactions/side effects.   Do not drive, operate heavy machinery, perform activities at heights, swimming or participation in water activities or provide baby sitting services if your were  admitted for syncope or siezures until you have seen by Primary MD or a Neurologist and advised to do so again.  Do not drive when taking Pain medications.   Procedures/Studies: EP STUDY Result Date: 07/09/2023 See surgical note for result.  ECHOCARDIOGRAM COMPLETE Result Date: 07/07/2023    ECHOCARDIOGRAM REPORT   Patient Name:   DELIGHT BICKLE Date of Exam: 07/07/2023 Medical Rec #:  [id number]             Height:       67.0 in Accession #:    [id number]  Weight:       210.0 lb Date of Birth:  [birth year]              BSA:          2.065 m Patient Age:    73 years              BP:           146/105 mmHg Patient Gender: F                     HR:           126 bpm. Exam Location:  Inpatient Procedure: 2D Echo, Cardiac Doppler, Color Doppler and Intracardiac            Opacification Agent (Both Spectral and Color Flow Doppler were            utilized during procedure). Indications:    Atrial Fibrillation I48.91  History:        Patient has prior history of Echocardiogram examinations, most                 recent 10/23/2022. CHF, Stroke, Arrythmias:Atrial Fibrillation,                 Atrial Flutter, Tachycardia and Bradycardia,                 Signs/Symptoms:Hypotension and Shortness of Breath; Risk                 Factors:Dyslipidemia, Hypertension and Sleep Apnea.  Sonographer:    Thea Norlander RCS Referring Phys: [id number] CHRISTOPHER L SCHUMANN IMPRESSIONS  1. Left ventricular ejection fraction, by estimation, is 30 to 35%. The left ventricle has moderately decreased function. The left ventricle demonstrates global hypokinesis. There is mild concentric left ventricular hypertrophy. Left ventricular diastolic function could not be evaluated.  2. Right ventricular systolic function is normal. The right ventricular size is normal. Tricuspid regurgitation signal is inadequate for assessing PA pressure.  3. Left atrial size was moderately dilated.  4. Right atrial size was moderately dilated.  5.  The mitral valve is normal in structure. No evidence of mitral valve regurgitation. No evidence of mitral stenosis.  6. The aortic valve is tricuspid. Aortic valve regurgitation is not visualized. No aortic stenosis is present.  7. The inferior vena cava is normal in size with greater than 50% respiratory variability, suggesting right atrial pressure of 3 mmHg. Comparison(s): Prior images reviewed side by side. The left ventricular function is significantly worse. This may be due to uncontrolled tachyarrhythmia. FINDINGS  Left Ventricle: Left ventricular ejection fraction, by estimation, is 30 to 35%. The left ventricle has moderately decreased function. The left ventricle demonstrates global hypokinesis. Definity contrast agent was given IV to delineate the left ventricular endocardial borders. The left ventricular internal cavity size was normal in size. There is mild concentric left ventricular hypertrophy. Left ventricular diastolic function could not be evaluated due to atrial fibrillation. Left ventricular diastolic function could not be evaluated. Right Ventricle: The right ventricular size is normal. Right vetricular wall thickness was not well visualized. Right ventricular systolic function is normal. Tricuspid regurgitation signal is inadequate for assessing PA pressure. Left Atrium: Left atrial size was moderately dilated. Right Atrium: Right atrial size was moderately dilated. Pericardium: There is no evidence of pericardial effusion. Mitral Valve: The mitral valve is normal in structure. Mild mitral annular calcification. No evidence of mitral valve regurgitation. No evidence of mitral valve stenosis. Tricuspid  Valve: The tricuspid valve is grossly normal. Tricuspid valve regurgitation is not demonstrated. Aortic Valve: The aortic valve is tricuspid. Aortic valve regurgitation is not visualized. No aortic stenosis is present. Aortic valve peak gradient measures 4.7 mmHg. Pulmonic Valve: The pulmonic  valve was grossly normal. Pulmonic valve regurgitation is not visualized. No evidence of pulmonic stenosis. Aorta: The aortic root is normal in size and structure. Venous: The inferior vena cava is normal in size with greater than 50% respiratory variability, suggesting right atrial pressure of 3 mmHg. IAS/Shunts: The interatrial septum was not assessed.  LEFT VENTRICLE PLAX 2D LVIDd:         5.10 cm LVIDs:         3.90 cm LV PW:         1.20 cm LV IVS:        1.10 cm LVOT diam:     2.20 cm LV SV:         33 LV SV Index:   16 LVOT Area:     3.80 cm  RIGHT VENTRICLE             IVC RV S prime:     14.00 cm/s  IVC diam: 1.50 cm TAPSE (M-mode): 2.5 cm LEFT ATRIUM             Index        RIGHT ATRIUM           Index LA diam:        5.00 cm 2.42 cm/m   RA Area:     22.50 cm LA Vol (A2C):   72.5 ml 35.12 ml/m  RA Volume:   70.60 ml  34.20 ml/m LA Vol (A4C):   59.5 ml 28.82 ml/m LA Biplane Vol: 65.2 ml 31.58 ml/m  AORTIC VALVE AV Area (Vmax): 2.22 cm AV Vmax:        108.00 cm/s AV Peak Grad:   4.7 mmHg LVOT Vmax:      62.93 cm/s LVOT Vmean:     41.267 cm/s LVOT VTI:       0.087 m  AORTA Ao Root diam: 3.20 cm Ao Asc diam:  3.80 cm MITRAL VALVE MV Area (PHT): 4.96 cm    SHUNTS MV Decel Time: 153 msec    Systemic VTI:  0.09 m MV E velocity: 69.90 cm/s  Systemic Diam: 2.20 cm Jerel Croitoru MD Electronically signed by Jerel Balding MD Signature Date/Time: 07/07/2023/5:01:11 PM    Final    DG Chest 2 View Result Date: 07/06/2023 CLINICAL DATA:  Chest pain palpitation EXAM: CHEST - 2 VIEW COMPARISON:  07/01/2023 FINDINGS: Left-sided pacing device as before. No acute airspace disease or effusion. Stable cardiomediastinal silhouette with aortic atherosclerosis. IMPRESSION: No active cardiopulmonary disease. Electronically Signed   By: Luke Bun M.D.   On: 07/06/2023 16:49   CT Angio Chest PE W and/or Wo Contrast Result Date: 07/01/2023 CLINICAL DATA:  High probability for PE EXAM: CT ANGIOGRAPHY CHEST WITH  CONTRAST TECHNIQUE: Multidetector CT imaging of the chest was performed using the standard protocol during bolus administration of intravenous contrast. Multiplanar CT image reconstructions and MIPs were obtained to evaluate the vascular anatomy. RADIATION DOSE REDUCTION: This exam was performed according to the departmental dose-optimization program which includes automated exposure control, adjustment of the mA and/or kV according to patient size and/or use of iterative reconstruction technique. CONTRAST:  75mL OMNIPAQUE  IOHEXOL  350 MG/ML SOLN COMPARISON:  Chest CT 12/25/2022 FINDINGS: Cardiovascular: The heart is mildly enlarged. Aorta is  normal in size. Left-sided pacemaker is present. There is no pericardial effusion. There are atherosclerotic calcifications of the aorta and coronary arteries. There is adequate opacification of the pulmonary arteries to the segmental level. There is no evidence for pulmonary embolism. Mediastinum/Nodes: There are nonenlarged and enlarged mediastinal lymph nodes diffusely. These measure up to 12 mm in the paratracheal region, 2.1 cm in the subcarinal region, 12 mm in the right hilar region, and 12 mm in the left hilar region. Visualized esophagus and thyroid  gland are within normal limits. Lungs/Pleura: There is a trace right pleural effusion. There is some central peribronchial wall thickening bilaterally. There is minimal air trapping in the right lung base. Upper Abdomen: No acute abnormality. Musculoskeletal: Chronic T12 compression deformity is unchanged. Review of the MIP images confirms the above findings. IMPRESSION: 1. No evidence for pulmonary embolism. 2. Mild cardiomegaly. 3. Trace right pleural effusion. 4. Central peribronchial wall thickening bilaterally may be due to bronchitis. 5. Mediastinal and hilar lymphadenopathy. This may be reactive, but neoplasm is not excluded. 6. Air trapping in the right lung base can be seen with small airways disease. Aortic  Atherosclerosis (ICD10-I70.0). Electronically Signed   By: Greig Pique M.D.   On: 07/01/2023 18:05   DG Chest 2 View Result Date: 07/01/2023 CLINICAL DATA:  Shortness of breath. EXAM: CHEST - 2 VIEW COMPARISON:  Chest radiograph dated 03/10/2023. FINDINGS: No focal consolidation, pleural effusion or pneumothorax. The cardiac silhouette is within limits. Atherosclerotic calcification of the aorta. Left pectoral pacemaker device. No acute osseous pathology. IMPRESSION: No active cardiopulmonary disease. Electronically Signed   By: Vanetta Chou M.D.   On: 07/01/2023 14:08   NM PET CT CARDIAC PERFUSION MULTI W/ABSOLUTE BLOODFLOW Result Date: 06/17/2023   LV perfusion is normal. There is no evidence of ischemia. There is no evidence of infarction.   Rest left ventricular function is abnormal. Rest global function is moderately reduced. There were no regional wall motion abnormalities. Rest EF: 38%. Stress left ventricular function is abnormal. Stress global function is mildly reduced. There were no regional wall abnormalities. Stress EF: 45%. End diastolic cavity size is moderately enlarged. End systolic cavity size is moderately enlarged.   Myocardial blood flow was computed to be 0.5ml/g/min at rest and 1.3ml/g/min at stress. Global myocardial blood flow reserve was 1.77 and was mildly abnormal.   Coronary calcium  was present on the attenuation correction CT images. Moderate coronary calcifications were present. Coronary calcifications were present in the left anterior descending artery and left circumflex artery distribution(s).   Findings are consistent with no ischemia and no infarction. The study is low risk.   Abnormal resting/stress EF suggest echo correlation. Normal perfusion but abnormal MBFR may represent microvascular dx or somewhat high resting flow CLINICAL DATA:  This over-read does not include interpretation of cardiac or coronary anatomy or pathology. No interpretation the PET data set.  The cardiac PET-CT interpretation by the cardiologist is attached. COMPARISON:  None Available. FINDINGS: Limited view of the lung parenchyma demonstrates no suspicious nodularity. Airways are normal. Limited view of the mediastinum demonstrates no adenopathy. Esophagus normal. Limited view of the upper abdomen unremarkable. Limited view of the skeleton and chest wall is unremarkable. IMPRESSION: No significant extracardiac findings. Electronically Signed   By: Jackquline Boxer M.D.   On: 06/17/2023 14:09  CUP PACEART INCLINIC DEVICE CHECK Result Date: 06/09/2023 Normal in-clinic _dual__ chamber pacemaker check. Presenting Rhythm: _AP-VS__ . Routine testing of thresholds, sensing, and impedance demonstrate stable parameters. Adjusted threshold for chronic leads.  Frequent AF episodes. Estimated longevity _13 years___ . Pt enrolled in remote follow-up. CANDIE Needle, NP    The results of significant diagnostics from this hospitalization (including imaging, microbiology, ancillary and laboratory) are listed below for reference.     Microbiology: Recent Results (from the past 240 hours)  Resp panel by RT-PCR (RSV, Flu A&B, Covid) Anterior Nasal Swab     Status: None   Collection Time: 07/01/23  2:45 PM   Specimen: Anterior Nasal Swab  Result Value Ref Range Status   SARS Coronavirus 2 by RT PCR NEGATIVE NEGATIVE Final    Comment: (NOTE) SARS-CoV-2 target nucleic acids are NOT DETECTED.  The SARS-CoV-2 RNA is generally detectable in upper respiratory specimens during the acute phase of infection. The lowest concentration of SARS-CoV-2 viral copies this assay can detect is 138 copies/mL. A negative result does not preclude SARS-Cov-2 infection and should not be used as the sole basis for treatment or other patient management decisions. A negative result may occur with  improper specimen collection/handling, submission of specimen other than nasopharyngeal swab, presence of viral mutation(s) within  the areas targeted by this assay, and inadequate number of viral copies(<138 copies/mL). A negative result must be combined with clinical observations, patient history, and epidemiological information. The expected result is Negative.  Fact Sheet for Patients:  BloggerCourse.com  Fact Sheet for Healthcare Providers:  SeriousBroker.it  This test is no t yet approved or cleared by the [place]  FDA and  has been authorized for detection and/or diagnosis of SARS-CoV-2 by FDA under an Emergency Use Authorization (EUA). This EUA will remain  in effect (meaning this test can be used) for the duration of the COVID-19 declaration under Section 564(b)(1) of the Act, 21 U.S.C.section 360bbb-3(b)(1), unless the authorization is terminated  or revoked sooner.       Influenza A by PCR NEGATIVE NEGATIVE Final   Influenza B by PCR NEGATIVE NEGATIVE Final    Comment: (NOTE) The Xpert Xpress SARS-CoV-2/FLU/RSV plus assay is intended as an aid in the diagnosis of influenza from Nasopharyngeal swab specimens and should not be used as a sole basis for treatment. Nasal washings and aspirates are unacceptable for Xpert Xpress SARS-CoV-2/FLU/RSV testing.  Fact Sheet for Patients: BloggerCourse.com  Fact Sheet for Healthcare Providers: SeriousBroker.it  This test is not yet approved or cleared by the [place]  FDA and has been authorized for detection and/or diagnosis of SARS-CoV-2 by FDA under an Emergency Use Authorization (EUA). This EUA will remain in effect (meaning this test can be used) for the duration of the COVID-19 declaration under Section 564(b)(1) of the Act, 21 U.S.C. section 360bbb-3(b)(1), unless the authorization is terminated or revoked.     Resp Syncytial Virus by PCR NEGATIVE NEGATIVE Final    Comment: (NOTE) Fact Sheet for  Patients: BloggerCourse.com  Fact Sheet for Healthcare Providers: SeriousBroker.it  This test is not yet approved or cleared by the [place]  FDA and has been authorized for detection and/or diagnosis of SARS-CoV-2 by FDA under an Emergency Use Authorization (EUA). This EUA will remain in effect (meaning this test can be used) for the duration of the COVID-19 declaration under Section 564(b)(1) of the Act, 21 U.S.C. section 360bbb-3(b)(1), unless the authorization is terminated or revoked.  Performed at [facility], [address] [place], [place] [address]      Labs: BNP (last 3 results) No results for input(s): BNP in the last 8760 hours. Basic Metabolic Panel: Recent Labs  Lab 07/06/23 1545 07/07/23 0430  07/08/23 0356 07/09/23 0422  NA 137 136 139 133*  K 4.7 3.4* 4.2 3.9  CL 98 99 105 100  CO2 26 29 25 26   GLUCOSE 114* 109* 101* 105*  BUN 18 15 16 17   CREATININE 0.67 0.72 0.73 0.97  CALCIUM  9.0 8.6* 8.8* 8.7*  MG  --  2.2 2.2 2.2  PHOS  --   --   --  3.7   Liver Function Tests: No results for input(s): AST, ALT, ALKPHOS, BILITOT, PROT, ALBUMIN in the last 168 hours. No results for input(s): LIPASE, AMYLASE in the last 168 hours. No results for input(s): AMMONIA in the last 168 hours. CBC: Recent Labs  Lab 07/06/23 1545 07/07/23 0430 07/08/23 0356 07/09/23 0422  WBC 13.8* 14.1* 16.0* 15.0*  HGB 12.1 12.5 13.4 13.1  HCT 37.6 39.5 42.3 41.5  MCV 90.0 92.5 91.6 91.6  PLT 421* 398 407* 344   Cardiac Enzymes: No results for input(s): CKTOTAL, CKMB, CKMBINDEX, TROPONINI in the last 168 hours. BNP: Invalid input(s): POCBNP CBG: No results for input(s): GLUCAP in the last 168 hours. D-Dimer No results for input(s): DDIMER in the last 72 hours. Hgb A1c No results for input(s): HGBA1C in the last 72 hours. Lipid Profile No results for input(s):  CHOL, HDL, LDLCALC, TRIG, CHOLHDL, LDLDIRECT in the last 72 hours. Thyroid  function studies No results for input(s): TSH, T4TOTAL, T3FREE, THYROIDAB in the last 72 hours.  Invalid input(s): FREET3 Anemia work up No results for input(s): VITAMINB12, FOLATE, FERRITIN, TIBC, IRON , RETICCTPCT in the last 72 hours. Urinalysis    Component Value Date/Time   COLORURINE COLORLESS (A) 02/28/2022 2019   APPEARANCEUR CLEAR 02/28/2022 2019   LABSPEC 1.006 02/28/2022 2019   PHURINE 5.5 02/28/2022 2019   GLUCOSEU NEGATIVE 02/28/2022 2019   HGBUR NEGATIVE 02/28/2022 2019   BILIRUBINUR NEGATIVE 02/28/2022 2019   BILIRUBINUR negative 03/29/2021 1126   KETONESUR NEGATIVE 02/28/2022 2019   PROTEINUR NEGATIVE 02/28/2022 2019   UROBILINOGEN 0.2 03/29/2021 1126   NITRITE NEGATIVE 02/28/2022 2019   LEUKOCYTESUR NEGATIVE 02/28/2022 2019   Sepsis Labs Recent Labs  Lab 07/06/23 1545 07/07/23 0430 07/08/23 0356 07/09/23 0422  WBC 13.8* 14.1* 16.0* 15.0*   Microbiology Recent Results (from the past 240 hours)  Resp panel by RT-PCR (RSV, Flu A&B, Covid) Anterior Nasal Swab     Status: None   Collection Time: 07/01/23  2:45 PM   Specimen: Anterior Nasal Swab  Result Value Ref Range Status   SARS Coronavirus 2 by RT PCR NEGATIVE NEGATIVE Final    Comment: (NOTE) SARS-CoV-2 target nucleic acids are NOT DETECTED.  The SARS-CoV-2 RNA is generally detectable in upper respiratory specimens during the acute phase of infection. The lowest concentration of SARS-CoV-2 viral copies this assay can detect is 138 copies/mL. A negative result does not preclude SARS-Cov-2 infection and should not be used as the sole basis for treatment or other patient management decisions. A negative result may occur with  improper specimen collection/handling, submission of specimen other than nasopharyngeal swab, presence of viral mutation(s) within the areas targeted by this assay, and  inadequate number of viral copies(<138 copies/mL). A negative result must be combined with clinical observations, patient history, and epidemiological information. The expected result is Negative.  Fact Sheet for Patients:  BloggerCourse.com  Fact Sheet for Healthcare Providers:  SeriousBroker.it  This test is no t yet approved or cleared by the [place]  FDA and  has been authorized for detection and/or diagnosis of SARS-CoV-2 by FDA under  an Emergency Use Authorization (EUA). This EUA will remain  in effect (meaning this test can be used) for the duration of the COVID-19 declaration under Section 564(b)(1) of the Act, 21 U.S.C.section 360bbb-3(b)(1), unless the authorization is terminated  or revoked sooner.       Influenza A by PCR NEGATIVE NEGATIVE Final   Influenza B by PCR NEGATIVE NEGATIVE Final    Comment: (NOTE) The Xpert Xpress SARS-CoV-2/FLU/RSV plus assay is intended as an aid in the diagnosis of influenza from Nasopharyngeal swab specimens and should not be used as a sole basis for treatment. Nasal washings and aspirates are unacceptable for Xpert Xpress SARS-CoV-2/FLU/RSV testing.  Fact Sheet for Patients: BloggerCourse.com  Fact Sheet for Healthcare Providers: SeriousBroker.it  This test is not yet approved or cleared by the [place]  FDA and has been authorized for detection and/or diagnosis of SARS-CoV-2 by FDA under an Emergency Use Authorization (EUA). This EUA will remain in effect (meaning this test can be used) for the duration of the COVID-19 declaration under Section 564(b)(1) of the Act, 21 U.S.C. section 360bbb-3(b)(1), unless the authorization is terminated or revoked.     Resp Syncytial Virus by PCR NEGATIVE NEGATIVE Final    Comment: (NOTE) Fact Sheet for Patients: BloggerCourse.com  Fact Sheet for Healthcare  Providers: SeriousBroker.it  This test is not yet approved or cleared by the [place]  FDA and has been authorized for detection and/or diagnosis of SARS-CoV-2 by FDA under an Emergency Use Authorization (EUA). This EUA will remain in effect (meaning this test can be used) for the duration of the COVID-19 declaration under Section 564(b)(1) of the Act, 21 U.S.C. section 360bbb-3(b)(1), unless the authorization is terminated or revoked.  Performed at [facility], [address] [place], [place] [address]      Time coordinating discharge:  I have spent 35 minutes face to face with the patient and on the ward discussing the patients care, assessment, plan and disposition with other care givers. >50% of the time was devoted counseling the patient about the risks and benefits of treatment/Discharge disposition and coordinating care.   SIGNED:   Burgess JAYSON Dare, MD  [facility] 07/09/2023, 2:17 PM   If 7PM-7AM, please contact night-coverage

## 2023-07-09 NOTE — Interval H&P Note (Signed)
 History and Physical Interval Note:  07/09/2023 8:52 AM  Shannon [name]  has presented today for surgery, with the diagnosis of afib.  The various methods of treatment have been discussed with the patient and family. After consideration of risks, benefits and other options for treatment, the patient has consented to  Procedure(s): CARDIOVERSION (N/A) as a surgical intervention.  The patient's history has been reviewed, patient examined, no change in status, stable for surgery.  I have reviewed the patient's chart and labs.  Questions were answered to the patient's satisfaction.     Vina Gull

## 2023-07-09 NOTE — Plan of Care (Signed)
 Problem: Education: Goal: Knowledge of General Education information will improve Description: Including pain rating scale, medication(s)/side effects and non-pharmacologic comfort measures 07/09/2023 1241 by Emil Roselie SAUNDERS, RN Outcome: Adequate for Discharge 07/09/2023 1240 by Emil Roselie SAUNDERS, RN Outcome: Adequate for Discharge   Problem: Health Behavior/Discharge Planning: Goal: Ability to manage health-related needs will improve 07/09/2023 1241 by Emil Roselie SAUNDERS, RN Outcome: Adequate for Discharge 07/09/2023 1240 by Emil Roselie SAUNDERS, RN Outcome: Adequate for Discharge   Problem: Clinical Measurements: Goal: Ability to maintain clinical measurements within normal limits will improve 07/09/2023 1241 by Emil Roselie SAUNDERS, RN Outcome: Adequate for Discharge 07/09/2023 1240 by Emil Roselie SAUNDERS, RN Outcome: Adequate for Discharge Goal: Will remain free from infection 07/09/2023 1241 by Emil Roselie SAUNDERS, RN Outcome: Adequate for Discharge 07/09/2023 1240 by Emil Roselie SAUNDERS, RN Outcome: Adequate for Discharge Goal: Diagnostic test results will improve 07/09/2023 1241 by Emil Roselie SAUNDERS, RN Outcome: Adequate for Discharge 07/09/2023 1240 by Emil Roselie SAUNDERS, RN Outcome: Adequate for Discharge Goal: Respiratory complications will improve 07/09/2023 1241 by Emil Roselie SAUNDERS, RN Outcome: Adequate for Discharge 07/09/2023 1240 by Emil Roselie SAUNDERS, RN Outcome: Adequate for Discharge Goal: Cardiovascular complication will be avoided 07/09/2023 1241 by Emil Roselie SAUNDERS, RN Outcome: Adequate for Discharge 07/09/2023 1240 by Emil Roselie SAUNDERS, RN Outcome: Adequate for Discharge   Problem: Activity: Goal: Risk for activity intolerance will decrease 07/09/2023 1241 by Emil Roselie SAUNDERS, RN Outcome: Adequate for Discharge 07/09/2023 1240 by Emil Roselie SAUNDERS, RN Outcome: Adequate for Discharge   Problem: Nutrition: Goal: Adequate nutrition will be maintained 07/09/2023 1241 by  Emil Roselie SAUNDERS, RN Outcome: Adequate for Discharge 07/09/2023 1240 by Emil Roselie SAUNDERS, RN Outcome: Adequate for Discharge   Problem: Coping: Goal: Level of anxiety will decrease 07/09/2023 1241 by Emil Roselie SAUNDERS, RN Outcome: Adequate for Discharge 07/09/2023 1240 by Emil Roselie SAUNDERS, RN Outcome: Adequate for Discharge   Problem: Elimination: Goal: Will not experience complications related to bowel motility 07/09/2023 1241 by Emil Roselie SAUNDERS, RN Outcome: Adequate for Discharge 07/09/2023 1240 by Emil Roselie SAUNDERS, RN Outcome: Adequate for Discharge Goal: Will not experience complications related to urinary retention 07/09/2023 1241 by Emil Roselie SAUNDERS, RN Outcome: Adequate for Discharge 07/09/2023 1240 by Emil Roselie SAUNDERS, RN Outcome: Adequate for Discharge   Problem: Pain Managment: Goal: General experience of comfort will improve and/or be controlled 07/09/2023 1241 by Emil Roselie SAUNDERS, RN Outcome: Adequate for Discharge 07/09/2023 1240 by Emil Roselie SAUNDERS, RN Outcome: Adequate for Discharge   Problem: Safety: Goal: Ability to remain free from injury will improve 07/09/2023 1241 by Emil Roselie SAUNDERS, RN Outcome: Adequate for Discharge 07/09/2023 1240 by Emil Roselie SAUNDERS, RN Outcome: Adequate for Discharge   Problem: Skin Integrity: Goal: Risk for impaired skin integrity will decrease 07/09/2023 1241 by Emil Roselie SAUNDERS, RN Outcome: Adequate for Discharge 07/09/2023 1240 by Emil Roselie SAUNDERS, RN Outcome: Adequate for Discharge   Problem: Education: Goal: Knowledge of disease or condition will improve 07/09/2023 1241 by Emil Roselie SAUNDERS, RN Outcome: Adequate for Discharge 07/09/2023 1240 by Emil Roselie SAUNDERS, RN Outcome: Adequate for Discharge Goal: Understanding of medication regimen will improve 07/09/2023 1241 by Emil Roselie SAUNDERS, RN Outcome: Adequate for Discharge 07/09/2023 1240 by Emil Roselie SAUNDERS, RN Outcome: Adequate for Discharge Goal:  Individualized Educational Video(s) 07/09/2023 1241 by Emil Roselie SAUNDERS, RN Outcome: Adequate for Discharge 07/09/2023 1240 by Emil Roselie SAUNDERS, RN Outcome: Adequate for Discharge   Problem: Activity: Goal: Ability to tolerate increased activity will  improve 07/09/2023 1241 by Emil Roselie SAUNDERS, RN Outcome: Adequate for Discharge 07/09/2023 1240 by Emil Roselie SAUNDERS, RN Outcome: Adequate for Discharge   Problem: Cardiac: Goal: Ability to achieve and maintain adequate cardiopulmonary perfusion will improve 07/09/2023 1241 by Emil Roselie SAUNDERS, RN Outcome: Adequate for Discharge 07/09/2023 1240 by Emil Roselie SAUNDERS, RN Outcome: Adequate for Discharge   Problem: Health Behavior/Discharge Planning: Goal: Ability to safely manage health-related needs after discharge will improve 07/09/2023 1241 by Emil Roselie SAUNDERS, RN Outcome: Adequate for Discharge 07/09/2023 1240 by Emil Roselie SAUNDERS, RN Outcome: Adequate for Discharge

## 2023-07-09 NOTE — Progress Notes (Addendum)
 Progress Note  Patient Name: Shannon [name] Date of Encounter: 07/09/2023 [facility] Cardiologist: None   Interval Summary   Patient was being taken down for DCCV  No complaints  Vital Signs Vitals:   07/08/23 2237 07/08/23 2239 07/08/23 2356 07/09/23 0423  BP: (!) 155/120  (!) 147/103 (!) 149/100  Pulse:  98 (!) 113   Resp: 18 19 18 16   Temp:   98.1 F (36.7 C) 98 F (36.7 C)  TempSrc:   Axillary Axillary  SpO2: 99% 98% 96% 100%  Weight:      Height:       Intake/Output Summary (Last 24 hours) at 07/09/2023 0737 Last data filed at 07/09/2023 0423 Gross per 24 hour  Intake 820 ml  Output --  Net 820 ml      07/07/2023    2:27 PM 07/06/2023    3:12 PM 06/09/2023   12:49 PM  Last 3 Weights  Weight (lbs) 188 lb 4.8 oz 210 lb 194 lb 9.6 oz  Weight (kg) 85.412 kg 95.255 kg 88.27 kg      Telemetry/ECG  N/A, off the floor - Personally Reviewed  Physical Exam  GEN: No acute distress.   Neck: No JVD Cardiac: irregularly irregular , no murmurs, rubs, or gallops.  Respiratory: Clear to auscultation bilaterally. GI: Soft, nontender, non-distended  MS: No edema  Assessment & Plan  Shannon [name] is a 73 y.o. female with SSS, persistent AF/RVR c/b post conversion pauses s/p LEFT MDT DC/LBaP PPM (DOI 03/09/23, Dr. Kennyth), HTN, OSA, drug-induced SLE, prior stroke and anxiety who presents with recurrent AF/RVR    Persistent atrial fibrillation with episodes of RVR Sick sinus syndrome s/p PPM 03/2023 Device interrogation showed she entered A. Fib 6/30 Unsuccessful cardioversion x 2 in ED, HR remains in 110-130s Given IV diltiazem  in ED, stopped due to reduced EF Currently in A. 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  Unsuccessful DCCV in ED.  Amiodarone  would be best option but she  declines as she had intolerance to this in the past.  She has also failed multiple ablations and Tikosyn . Options appear limited, may ultimately be headed for AV nodal ablation.  Discussed with EP and recommended trying repeat DCCV at 360J, underwent successful cardioversion now in Apaced rhythm   HFrEF Hypertension  Echo this admission showed: LVEF 30-35%, mild LVH, normal RV function, biatrial enlargement, normal IVC Continue PO Lasix  20 mg daily Continue Losartan  25 mg daily Consolidated metoprolol  to Toprol -XL 100 mg twice daily   Hyperlipidemia  10/15/2022: HDL 60; LDL Chol Calc (NIH) 62 07/01/2023: ALT 13  Continue simvastatin  20 mg daily    Per primary  History of CVA OSA on CPAP Asthma Anxiety  Recent URI   [facility] will sign off.   Medication Recommendations: Eliquis  5 mg twice daily, losartan  25 mg daily, Toprol -XL 100 mg twice daily Other recommendations (labs, testing, etc): None Follow up as an outpatient: We will schedule    For questions or updates, please contact [facility] Please consult [email or web address] for contact info under       Signed, Waddell DELENA Donath, PA-C   Patient seen and examined.  Agree with above documentation.  On exam, patient is alert and oriented, regular rate and rhythm, no murmurs, lungs CTAB, no LE edema or JVD.  Successful DCCV today, now in sinus rhythm.  Okay to discharge from cardiac standpoint.  Will schedule follow-up with general cardiology and EP.  Lonni LITTIE Nanas, MD

## 2023-07-09 NOTE — Progress Notes (Signed)
 PROGRESS NOTE    Shannon [name]  FMW:[id number] DOB: [birth year] DOA: 07/06/2023 PCP: Knute [name], FNP    Brief Narrative:  73 year old with history of paroxysmal A-fib on Eliquis  failed ablation, chronic systolic CHF EF 35%, anxiety, depression, OSA on CPAP presented with lightheadedness and palpitation.  Recently recovering from upper respiratory illness.  Found to be in atrial fibrillation with RVR, cardiology team consulted.  Eventually EP team was consulted, patient underwent successful cardioversion on 7/3.  Once cleared by cardiology will discharge the patient   Assessment & Plan:  Principal Problem:   Rapid atrial fibrillation (HCC) Active Problems:   OSA (obstructive sleep apnea)   Asthma   History of CVA (cerebrovascular accident)   GAD (generalized anxiety disorder)   Chronic HFrEF (heart failure with reduced ejection fraction) (HCC)    Paroxysmal A-fib with RVR. Seen by cardiology, underwent successful cardioversion on 7/3.  Currently normal sinus rhythm   Chronic systolic CHF. Volume level adequate.  EF on recent nuc med study 45%, EF on echo 35% Continue home medication, GDMT per cardiology   History of CVA. Was on Zocor    OSA. Continue CPAP nightly.   History of asthma. Currently no evidence of exacerbation. Monitor.   Anxiety. Home regimen includes BuSpar  5 mg twice daily.   Recent upper airway infection. Supportive care.  On doxycycline , EOT 7/3 As needed bronchodilators   Unresponsive event after receiving Definity. Question of intolerance to Definity contrast.  Added to the allergy list by the previous provider.  No signs of respiratory compromise  DVT prophylaxis: apixaban  (ELIQUIS ) tablet 5 mg     Code Status: Full Code Family Communication:   Discharge once cleared by cardiology   Subjective:  Seen post cardioversion, tolerated well.  Examination:  General exam: Appears calm and comfortable  Respiratory system: Clear  to auscultation. Respiratory effort normal. Cardiovascular system: NSR Gastrointestinal system: Abdomen is nondistended, soft and nontender. No organomegaly or masses felt. Normal bowel sounds heard. Central nervous system: Alert and oriented. No focal neurological deficits. Extremities: Symmetric 5 x 5 power. Skin: No rashes, lesions or ulcers Psychiatry: Judgement and insight appear normal. Mood & affect appropriate.                Diet Orders (From admission, onward)     Start     Ordered   07/09/23 1041  Diet Heart Room service appropriate? Yes; Fluid consistency: Thin  Diet effective now       Question Answer Comment  Room service appropriate? Yes   Fluid consistency: Thin      07/09/23 1040            Objective: Vitals:   07/09/23 0929 07/09/23 0930 07/09/23 0940 07/09/23 1024  BP: 111/68 111/68 135/74 (!) 154/83  Pulse: 60 61 60 66  Resp: 20 14 13 16   Temp:    98.1 F (36.7 C)  TempSrc:    Oral  SpO2: 96% 97% 96%   Weight:      Height:        Intake/Output Summary (Last 24 hours) at 07/09/2023 1112 Last data filed at 07/09/2023 9061 Gross per 24 hour  Intake 820 ml  Output --  Net 820 ml   Filed Weights   07/06/23 1512 07/07/23 1427  Weight: 95.3 kg 85.4 kg    Scheduled Meds:  apixaban   5 mg Oral BID   benzonatate   100 mg Oral TID   busPIRone   5 mg Oral BID   furosemide   20 mg Oral Daily   losartan   25 mg Oral Daily   metoprolol  succinate  100 mg Oral BID   simvastatin   20 mg Oral Daily   sodium chloride  flush  3 mL Intravenous Q12H   Continuous Infusions:  Nutritional status     Body mass index is 29.49 kg/m.  Data Reviewed:   CBC: Recent Labs  Lab 07/06/23 1545 07/07/23 0430 07/08/23 0356 07/09/23 0422  WBC 13.8* 14.1* 16.0* 15.0*  HGB 12.1 12.5 13.4 13.1  HCT 37.6 39.5 42.3 41.5  MCV 90.0 92.5 91.6 91.6  PLT 421* 398 407* 344   Basic Metabolic Panel: Recent Labs  Lab 07/06/23 1545 07/07/23 0430 07/08/23 0356  07/09/23 0422  NA 137 136 139 133*  K 4.7 3.4* 4.2 3.9  CL 98 99 105 100  CO2 26 29 25 26   GLUCOSE 114* 109* 101* 105*  BUN 18 15 16 17   CREATININE 0.67 0.72 0.73 0.97  CALCIUM  9.0 8.6* 8.8* 8.7*  MG  --  2.2 2.2 2.2  PHOS  --   --   --  3.7   GFR: Estimated Creatinine Clearance: 58.8 mL/min (by C-G formula based on SCr of 0.97 mg/dL). Liver Function Tests: No results for input(s): AST, ALT, ALKPHOS, BILITOT, PROT, ALBUMIN in the last 168 hours. No results for input(s): LIPASE, AMYLASE in the last 168 hours. No results for input(s): AMMONIA in the last 168 hours. Coagulation Profile: No results for input(s): INR, PROTIME in the last 168 hours. Cardiac Enzymes: No results for input(s): CKTOTAL, CKMB, CKMBINDEX, TROPONINI in the last 168 hours. BNP (last 3 results) Recent Labs    07/01/23 1610  PROBNP 3,772.0*   HbA1C: No results for input(s): HGBA1C in the last 72 hours. CBG: No results for input(s): GLUCAP in the last 168 hours. Lipid Profile: No results for input(s): CHOL, HDL, LDLCALC, TRIG, CHOLHDL, LDLDIRECT in the last 72 hours. Thyroid  Function Tests: No results for input(s): TSH, T4TOTAL, FREET4, T3FREE, THYROIDAB in the last 72 hours. Anemia Panel: No results for input(s): VITAMINB12, FOLATE, FERRITIN, TIBC, IRON , RETICCTPCT in the last 72 hours. Sepsis Labs: No results for input(s): PROCALCITON, LATICACIDVEN in the last 168 hours.  Recent Results (from the past 240 hours)  Resp panel by RT-PCR (RSV, Flu A&B, Covid) Anterior Nasal Swab     Status: None   Collection Time: 07/01/23  2:45 PM   Specimen: Anterior Nasal Swab  Result Value Ref Range Status   SARS Coronavirus 2 by RT PCR NEGATIVE NEGATIVE Final    Comment: (NOTE) SARS-CoV-2 target nucleic acids are NOT DETECTED.  The SARS-CoV-2 RNA is generally detectable in upper respiratory specimens during the acute phase of infection. The  lowest concentration of SARS-CoV-2 viral copies this assay can detect is 138 copies/mL. A negative result does not preclude SARS-Cov-2 infection and should not be used as the sole basis for treatment or other patient management decisions. A negative result may occur with  improper specimen collection/handling, submission of specimen other than nasopharyngeal swab, presence of viral mutation(s) within the areas targeted by this assay, and inadequate number of viral copies(<138 copies/mL). A negative result must be combined with clinical observations, patient history, and epidemiological information. The expected result is Negative.  Fact Sheet for Patients:  BloggerCourse.com  Fact Sheet for Healthcare Providers:  SeriousBroker.it  This test is no t yet approved or cleared by the [place]  FDA and  has been authorized for detection and/or diagnosis of SARS-CoV-2 by FDA under an  Emergency Use Authorization (EUA). This EUA will remain  in effect (meaning this test can be used) for the duration of the COVID-19 declaration under Section 564(b)(1) of the Act, 21 U.S.C.section 360bbb-3(b)(1), unless the authorization is terminated  or revoked sooner.       Influenza A by PCR NEGATIVE NEGATIVE Final   Influenza B by PCR NEGATIVE NEGATIVE Final    Comment: (NOTE) The Xpert Xpress SARS-CoV-2/FLU/RSV plus assay is intended as an aid in the diagnosis of influenza from Nasopharyngeal swab specimens and should not be used as a sole basis for treatment. Nasal washings and aspirates are unacceptable for Xpert Xpress SARS-CoV-2/FLU/RSV testing.  Fact Sheet for Patients: BloggerCourse.com  Fact Sheet for Healthcare Providers: SeriousBroker.it  This test is not yet approved or cleared by the [place]  FDA and has been authorized for detection and/or diagnosis of SARS-CoV-2 by FDA under  an Emergency Use Authorization (EUA). This EUA will remain in effect (meaning this test can be used) for the duration of the COVID-19 declaration under Section 564(b)(1) of the Act, 21 U.S.C. section 360bbb-3(b)(1), unless the authorization is terminated or revoked.     Resp Syncytial Virus by PCR NEGATIVE NEGATIVE Final    Comment: (NOTE) Fact Sheet for Patients: BloggerCourse.com  Fact Sheet for Healthcare Providers: SeriousBroker.it  This test is not yet approved or cleared by the [place]  FDA and has been authorized for detection and/or diagnosis of SARS-CoV-2 by FDA under an Emergency Use Authorization (EUA). This EUA will remain in effect (meaning this test can be used) for the duration of the COVID-19 declaration under Section 564(b)(1) of the Act, 21 U.S.C. section 360bbb-3(b)(1), unless the authorization is terminated or revoked.  Performed at [facility], [address], [place], [place] [address]          Radiology Studies: EP STUDY Result Date: 07/09/2023 See surgical note for result.  ECHOCARDIOGRAM COMPLETE Result Date: 07/07/2023    ECHOCARDIOGRAM REPORT   Patient Name:   Shannon [name] Date of Exam: 07/07/2023 Medical Rec #:  [id number]             Height:       67.0 in Accession #:    [id number]            Weight:       210.0 lb Date of Birth:  [birth year]              BSA:          2.065 m Patient Age:    73 years              BP:           146/105 mmHg Patient Gender: F                     HR:           126 bpm. Exam Location:  Inpatient Procedure: 2D Echo, Cardiac Doppler, Color Doppler and Intracardiac            Opacification Agent (Both Spectral and Color Flow Doppler were            utilized during procedure). Indications:    Atrial Fibrillation I48.91  History:        Patient has prior history of Echocardiogram examinations, most                 recent 10/23/2022. CHF, Stroke,  Arrythmias:Atrial Fibrillation,  Atrial Flutter, Tachycardia and Bradycardia,                 Signs/Symptoms:Hypotension and Shortness of Breath; Risk                 Factors:Dyslipidemia, Hypertension and Sleep Apnea.  Sonographer:    Thea Norlander RCS Referring Phys: [id number] CHRISTOPHER L SCHUMANN IMPRESSIONS  1. Left ventricular ejection fraction, by estimation, is 30 to 35%. The left ventricle has moderately decreased function. The left ventricle demonstrates global hypokinesis. There is mild concentric left ventricular hypertrophy. Left ventricular diastolic function could not be evaluated.  2. Right ventricular systolic function is normal. The right ventricular size is normal. Tricuspid regurgitation signal is inadequate for assessing PA pressure.  3. Left atrial size was moderately dilated.  4. Right atrial size was moderately dilated.  5. The mitral valve is normal in structure. No evidence of mitral valve regurgitation. No evidence of mitral stenosis.  6. The aortic valve is tricuspid. Aortic valve regurgitation is not visualized. No aortic stenosis is present.  7. The inferior vena cava is normal in size with greater than 50% respiratory variability, suggesting right atrial pressure of 3 mmHg. Comparison(s): Prior images reviewed side by side. The left ventricular function is significantly worse. This may be due to uncontrolled tachyarrhythmia. FINDINGS  Left Ventricle: Left ventricular ejection fraction, by estimation, is 30 to 35%. The left ventricle has moderately decreased function. The left ventricle demonstrates global hypokinesis. Definity contrast agent was given IV to delineate the left ventricular endocardial borders. The left ventricular internal cavity size was normal in size. There is mild concentric left ventricular hypertrophy. Left ventricular diastolic function could not be evaluated due to atrial fibrillation. Left ventricular diastolic function could not be evaluated.  Right Ventricle: The right ventricular size is normal. Right vetricular wall thickness was not well visualized. Right ventricular systolic function is normal. Tricuspid regurgitation signal is inadequate for assessing PA pressure. Left Atrium: Left atrial size was moderately dilated. Right Atrium: Right atrial size was moderately dilated. Pericardium: There is no evidence of pericardial effusion. Mitral Valve: The mitral valve is normal in structure. Mild mitral annular calcification. No evidence of mitral valve regurgitation. No evidence of mitral valve stenosis. Tricuspid Valve: The tricuspid valve is grossly normal. Tricuspid valve regurgitation is not demonstrated. Aortic Valve: The aortic valve is tricuspid. Aortic valve regurgitation is not visualized. No aortic stenosis is present. Aortic valve peak gradient measures 4.7 mmHg. Pulmonic Valve: The pulmonic valve was grossly normal. Pulmonic valve regurgitation is not visualized. No evidence of pulmonic stenosis. Aorta: The aortic root is normal in size and structure. Venous: The inferior vena cava is normal in size with greater than 50% respiratory variability, suggesting right atrial pressure of 3 mmHg. IAS/Shunts: The interatrial septum was not assessed.  LEFT VENTRICLE PLAX 2D LVIDd:         5.10 cm LVIDs:         3.90 cm LV PW:         1.20 cm LV IVS:        1.10 cm LVOT diam:     2.20 cm LV SV:         33 LV SV Index:   16 LVOT Area:     3.80 cm  RIGHT VENTRICLE             IVC RV S prime:     14.00 cm/s  IVC diam: 1.50 cm TAPSE (M-mode): 2.5 cm LEFT ATRIUM  Index        RIGHT ATRIUM           Index LA diam:        5.00 cm 2.42 cm/m   RA Area:     22.50 cm LA Vol (A2C):   72.5 ml 35.12 ml/m  RA Volume:   70.60 ml  34.20 ml/m LA Vol (A4C):   59.5 ml 28.82 ml/m LA Biplane Vol: 65.2 ml 31.58 ml/m  AORTIC VALVE AV Area (Vmax): 2.22 cm AV Vmax:        108.00 cm/s AV Peak Grad:   4.7 mmHg LVOT Vmax:      62.93 cm/s LVOT Vmean:     41.267 cm/s  LVOT VTI:       0.087 m  AORTA Ao Root diam: 3.20 cm Ao Asc diam:  3.80 cm MITRAL VALVE MV Area (PHT): 4.96 cm    SHUNTS MV Decel Time: 153 msec    Systemic VTI:  0.09 m MV E velocity: 69.90 cm/s  Systemic Diam: 2.20 cm Jerel Croitoru MD Electronically signed by Jerel Balding MD Signature Date/Time: 07/07/2023/5:01:11 PM    Final            LOS: 1 day   Time spent= 35 mins    Burgess JAYSON Dare, MD [facility]  If 7PM-7AM, please contact night-coverage  07/09/2023, 11:12 AM

## 2023-07-09 NOTE — CV Procedure (Signed)
 Electrical Cardioversion Procedure Note Shannon [name] [id number] Nov 11, 1950  Procedure: Electrical Cardioversion Indications:  Atrial Fibrillation  Procedure Details Consent: Risks of procedure as well as the alternatives and risks of each were explained to the (patient/caregiver).  Consent for procedure obtained. Time Out: Verified patient identification, verified procedure, site/side was marked, verified correct patient position, special equipment/implants available, medications/allergies/relevent history reviewed, required imaging and test results available.  Performed  Patient placed on cardiac monitor, pulse oximetry, supplemental oxygen as necessary.  Sedation given: propofol  and lidocaine  Pacer pads placed anterior and posterior chest.  Cardioverted 1 time(s).  Cardioverted with 360 J synchronized biphasic energy.  Pt initially went into an atrial flutter with 2:1 conduction and then after about 20 to 30 sec into SB/SR    Evaluation Findings: Post procedure EKG shows: NSR Complications: None Patient did tolerate procedure well.   Vina Gull 07/09/2023, 9:25 AM

## 2023-07-09 NOTE — Anesthesia Preprocedure Evaluation (Addendum)
 Anesthesia Evaluation  Patient identified by MRN, date of birth, ID band Patient awake    Reviewed: Allergy & Precautions, H&P , NPO status , Patient's Chart, lab work & pertinent test results  Airway Mallampati: II  TM Distance: >3 FB Neck ROM: Full    Dental  (+) Dental Advisory Given, Teeth Intact   Pulmonary asthma (remote hx) , sleep apnea and Continuous Positive Airway Pressure Ventilation , former smoker Quit smoking 2015, 22 pack year history    Pulmonary exam normal breath sounds clear to auscultation       Cardiovascular hypertension, Pt. 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 Left ventricular ejection fraction, by estimation, is 30 to 35%. The  left ventricle has moderately decreased function. The left ventricle  demonstrates global hypokinesis. There is mild concentric left ventricular  hypertrophy. Left ventricular  diastolic function could not be evaluated.   2. Right ventricular systolic function is normal. The right ventricular  size is normal. Tricuspid regurgitation signal is inadequate for assessing  PA pressure.   3. Left atrial size was moderately dilated.   4. Right atrial size was moderately dilated.   5. The mitral valve is normal in structure. No evidence of mitral valve  regurgitation. No evidence of mitral stenosis.   6. The aortic valve is tricuspid. Aortic valve regurgitation is not  visualized. No aortic stenosis is present.   7. The inferior vena cava is normal in size with greater than 50%  respiratory variability, suggesting right atrial pressure of 3 mmHg.     Neuro/Psych  Headaches PSYCHIATRIC DISORDERS Anxiety Depression    CVA    GI/Hepatic Neg liver ROS,GERD  Controlled,,  Endo/Other  negative endocrine ROS    Renal/GU negative Renal ROS   negative genitourinary   Musculoskeletal  (+) Arthritis , Osteoarthritis,    Abdominal   Peds negative pediatric ROS (+)  Hematology negative hematology ROS (+)   Anesthesia Other Findings   Reproductive/Obstetrics negative OB ROS                              Anesthesia Physical Anesthesia Plan  ASA: 4  Anesthesia Plan: General   Post-op Pain Management:    Induction: Intravenous  PONV Risk Score and Plan: TIVA and Treatment may vary due to age or medical condition  Airway Management Planned: Natural Airway and Mask  Additional Equipment: None  Intra-op Plan:   Post-operative Plan:   Informed Consent: I have reviewed the patients History and Physical, chart, labs and discussed the procedure including the risks, benefits and alternatives for the proposed anesthesia with the patient or authorized representative who has indicated his/her understanding and acceptance.       Plan Discussed with: CRNA  Anesthesia Plan Comments:          Anesthesia Quick Evaluation

## 2023-07-11 ENCOUNTER — Encounter (HOSPITAL_COMMUNITY): Payer: Self-pay | Admitting: Internal Medicine

## 2023-07-13 ENCOUNTER — Ambulatory Visit (HOSPITAL_BASED_OUTPATIENT_CLINIC_OR_DEPARTMENT_OTHER): Admitting: Family Medicine

## 2023-07-13 ENCOUNTER — Telehealth: Payer: Self-pay

## 2023-07-13 ENCOUNTER — Encounter (HOSPITAL_BASED_OUTPATIENT_CLINIC_OR_DEPARTMENT_OTHER): Payer: Self-pay | Admitting: Family Medicine

## 2023-07-13 VITALS — BP 156/77 | HR 83 | Ht 67.0 in | Wt 190.9 lb

## 2023-07-13 DIAGNOSIS — I4891 Unspecified atrial fibrillation: Secondary | ICD-10-CM | POA: Diagnosis not present

## 2023-07-13 DIAGNOSIS — J441 Chronic obstructive pulmonary disease with (acute) exacerbation: Secondary | ICD-10-CM | POA: Diagnosis not present

## 2023-07-13 DIAGNOSIS — Z09 Encounter for follow-up examination after completed treatment for conditions other than malignant neoplasm: Secondary | ICD-10-CM

## 2023-07-13 MED ORDER — BUDESONIDE-FORMOTEROL FUMARATE 80-4.5 MCG/ACT IN AERO: 2.0000 | INHALATION_SPRAY | Freq: Two times a day (BID) | RESPIRATORY_TRACT | 3 refills | Status: DC
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
## 2023-07-13 NOTE — Patient Instructions (Addendum)
 Try wedge pillow at night to help propping up with breathing   Try Symbicort  inhaler with 2 puffs twice daily as needed for shortness of breath. Please rinse your mouth out with each use (swish and spit to prevent thrush)   Follow up with Pulmonology for COPD

## 2023-07-13 NOTE — Transitions of Care (Post Inpatient/ED Visit) (Signed)
 07/13/2023  Name: Shannon [name] MRN: [id number] DOB: 12-22-1950  Today's TOC FU Call Status: Today's TOC FU Call Status:: Successful TOC FU Call Completed TOC FU Call Complete Date: 07/13/23 Patient's Name and Date of Birth confirmed.  Transition Care Management Follow-up Telephone Call Date of Discharge: 07/09/23 Discharge Facility: Shannon [name]) Type of Discharge: Inpatient Admission Primary Inpatient Discharge Diagnosis:: Afib with RPR How have you been since you were released from the [name]?: Better (feels weak. HR 70's) Any questions or concerns?: No  Items Reviewed: Did you receive and understand the discharge instructions provided?: Yes Medications obtained,verified, and reconciled?: Yes (Medications Reviewed) Any new allergies since your discharge?: No Dietary orders reviewed?: Yes Type of Diet Ordered:: low salt diet Do you have support at home?: Yes People in Home [RPT]: child(ren), adult Name of Support/Comfort Primary Source: daughterGLENWOOD [name]  Medications Reviewed Today: Medications Reviewed Today     Reviewed by Shannon Shannon PENNER, RN (Registered Nurse) on 07/13/23 at 1154  Med List Status: <None>   Medication Order Taking? Sig Documenting Provider Last Dose Status Informant  acetaminophen  (TYLENOL ) 500 MG tablet [id number] Yes Take 1,000 mg by mouth every 6 (six) hours as needed for moderate pain or headache. [provider]  Active Self, Pharmacy Records  benzonatate  (TESSALON ) 100 MG capsule [id number] Yes Take 1 capsule (100 mg total) by mouth 3 (three) times daily as needed for cough. Shannon Elsie BROCKS, PA-C  Active Self, Pharmacy Records  busPIRone  (BUSPAR ) 5 MG tablet [id number] Yes Take 1 tablet (5 mg total) by mouth 2 (two) times daily. Shannon Lynwood HERO, NP  Active Self, Pharmacy Records  Cholecalciferol (VITAMIN D ) 50 MCG (2000 UT) CAPS [id number] Yes Take 2,000 Units by mouth in the morning. [provider]  Active Self, Pharmacy Records   ELIQUIS  5 MG TABS tablet [id number] Yes TAKE 1 TABLET BY MOUTH TWICE  DAILY Shannon Ole DASEN, MD  Active Self, Pharmacy Records  furosemide  (LASIX ) 20 MG tablet [id number] Yes TAKE 1 TABLET BY MOUTH DAILY Shannon Ole DASEN, MD  Active Self, Pharmacy Records  Hydrocortisone [facility]) [id number] Yes Apply 1 application  topically as needed (skin irritation/itching). [provider]  Active Self, Pharmacy Records  levalbuterol  (XOPENEX  HFA) 45 MCG/ACT inhaler [id number] Yes Inhale 2 puffs into the lungs every 4 (four) hours as needed for wheezing. Shannon Lynwood HERO, NP  Active Self, Pharmacy Records  loratadine (CLARITIN) 10 MG tablet [id number] Yes Take 10 mg by mouth daily as needed for allergies. [provider]  Active Self, Pharmacy Records           Med Note [facility], Shannon [name]   Wed Mar 26, 2022  3:01 PM)    losartan  (COZAAR ) 25 MG tablet [id number] Yes Take 1 tablet (25 mg total) by mouth daily. Riddle, [name], NP  Active Self, Pharmacy Records  metoprolol  succinate (TOPROL -XL) 100 MG 24 hr tablet [id number] Yes Take 1 tablet (100 mg total) by mouth 2 (two) times daily. Take with or immediately following [name] meal. [name], [name], MD  Active   Multiple Vitamin (MULTIVITAMIN) tablet [id number] Yes Take 1 tablet by mouth at bedtime. [provider]  Active Self, Pharmacy Records  potassium chloride  20 MEQ TBCR [id number] Yes Take 1 tablet (20 mEq total) by mouth daily. Riddle, [name], NP  Active Self, Pharmacy Records  simvastatin  (ZOCOR ) 20 MG tablet [id number] Yes TAKE 1 TABLET BY MOUTH DAILY Shannon Lynwood HERO, NP  Active Self, Pharmacy Records  Home Care and Equipment/Supplies: Were Home Health Services Ordered?: No Any new equipment or medical supplies ordered?: No  Functional Questionnaire: Do you need assistance with bathing/showering or dressing?: No Do you need assistance with meal preparation?: No Do you need assistance with eating?: No Do you have  difficulty maintaining continence: No Do you need assistance with getting out of bed/getting out of [name] chair/moving?: No Do you have difficulty managing or taking your medications?: No  Follow up appointments reviewed: PCP Follow-up appointment confirmed?: Yes Date of PCP follow-up appointment?: 07/13/23 Follow-up Provider: Thersia [name] Follow-up appointment confirmed?: Yes Date of Specialist follow-up appointment?: 07/21/23 Follow-Up Specialty Provider:: Afib clinic Do you need transportation to your follow-up appointment?: No Do you understand care options if your condition(s) worsen?: Yes-patient verbalized understanding  SDOH Interventions Today    Flowsheet Row Most Recent Value  SDOH Interventions   Food Insecurity Interventions Intervention Not Indicated  Housing Interventions Intervention Not Indicated  Transportation Interventions Intervention Not Indicated  Utilities Interventions Intervention Not Indicated   Patient reports that she is very tired. Reports feeling short of breath. Using her inhaler every 4 hours.  States that she does not think she is wheezing. Reports that her chest is tight.  States heart rate in the 60-70's.  BP up and down.   States that she continues to have [name] cough.  No weight gain and states that she weighs daily. Denies any swelling in legs.   Has all her medications and knows how to take them. Currently in between cardiologist and PCP's.  Will see new PCP this afternoon. Reviewed all pending follow up appointments.  Encouraged patient to continue to take her medications as prescribed. Reviewed importance of call 911 if she has chest pain or feels like she is back in [name] fib.  Reviewed importance of following low salt diet. Reviewed CHF zones and when to call MD.  Reviewed and offered 30 day TOC program,patient declined. Provided my contact information for patient to call me if needed.    Shannon Ee, RN, BSN, CEN [facility]- Transition of Care Team.  [facility] [phone]

## 2023-07-14 DIAGNOSIS — J441 Chronic obstructive pulmonary disease with (acute) exacerbation: Secondary | ICD-10-CM | POA: Insufficient documentation

## 2023-07-14 LAB — CBC WITH DIFFERENTIAL/PLATELET
Basophils Absolute: 0.1 x10E3/uL (ref 0.0–0.2)
Basos: 0 %
EOS (ABSOLUTE): 0.3 x10E3/uL (ref 0.0–0.4)
Eos: 2 %
Hematocrit: 34.4 % (ref 34.0–46.6)
Hemoglobin: 11.1 g/dL (ref 11.1–15.9)
Immature Grans (Abs): 0 x10E3/uL (ref 0.0–0.1)
Immature Granulocytes: 0 %
Lymphocytes Absolute: 1.4 x10E3/uL (ref 0.7–3.1)
Lymphs: 11 %
MCH: 29.5 pg (ref 26.6–33.0)
MCHC: 32.3 g/dL (ref 31.5–35.7)
MCV: 92 fL (ref 79–97)
Monocytes Absolute: 1.4 x10E3/uL — ABNORMAL HIGH (ref 0.1–0.9)
Monocytes: 11 %
Neutrophils Absolute: 9.3 x10E3/uL — ABNORMAL HIGH (ref 1.4–7.0)
Neutrophils: 76 %
Platelets: 312 x10E3/uL (ref 150–450)
RBC: 3.76 x10E6/uL — ABNORMAL LOW (ref 3.77–5.28)
RDW: 12.9 % (ref 11.7–15.4)
WBC: 12.5 x10E3/uL — ABNORMAL HIGH (ref 3.4–10.8)

## 2023-07-14 LAB — BASIC METABOLIC PANEL WITH GFR
BUN/Creatinine Ratio: 16 (ref 12–28)
BUN: 13 mg/dL (ref 8–27)
CO2: 25 mmol/L (ref 20–29)
Calcium: 9.4 mg/dL (ref 8.7–10.3)
Chloride: 98 mmol/L (ref 96–106)
Creatinine, Ser: 0.82 mg/dL (ref 0.57–1.00)
Glucose: 99 mg/dL (ref 70–99)
Potassium: 5.4 mmol/L — ABNORMAL HIGH (ref 3.5–5.2)
Sodium: 138 mmol/L (ref 134–144)
eGFR: 76 mL/min/1.73 (ref >59)

## 2023-07-15 ENCOUNTER — Encounter (HOSPITAL_BASED_OUTPATIENT_CLINIC_OR_DEPARTMENT_OTHER): Payer: Self-pay | Admitting: Family Medicine

## 2023-07-15 ENCOUNTER — Ambulatory Visit (HOSPITAL_BASED_OUTPATIENT_CLINIC_OR_DEPARTMENT_OTHER): Payer: Self-pay | Admitting: Family Medicine

## 2023-07-15 DIAGNOSIS — Z09 Encounter for follow-up examination after completed treatment for conditions other than malignant neoplasm: Secondary | ICD-10-CM

## 2023-07-15 DIAGNOSIS — E875 Hyperkalemia: Secondary | ICD-10-CM

## 2023-07-15 NOTE — Telephone Encounter (Signed)
 Pt sent mychart message saying she had to stop the Symbicort  as it made her go into Afib.

## 2023-07-15 NOTE — Progress Notes (Signed)
 Please call patient and let her know her potassium did return slightly elevated. This may be due to decrease in hydration or medication changes from the hospital. Her calcium  and sodium did improve as well as her glucose. I would like her to increase clear fluids slightly (still follow fluid restrictions per cardiology) and continue taking her medications as directed. Her WBC decreased slightly but is still slightly elevated. I would like to repeat her BMP on Friday and repeat her CBC in 2 weeks.

## 2023-07-15 NOTE — Telephone Encounter (Signed)
 Please see recent mychart sent by pt.

## 2023-07-16 ENCOUNTER — Telehealth (HOSPITAL_COMMUNITY): Payer: Self-pay | Admitting: *Deleted

## 2023-07-16 NOTE — Telephone Encounter (Signed)
 Pt called very concerned being in afib day 2 since ER visit Friday had DCCV. She has resp. infection that was possible trigger. She states she is not feeling well. HR 100-138 B/P 161/108. Discussed with Daril Kicks P.A. and patient is limited with options due to medication history. ER precautions reviewed with pt. A sooner office was made with Dr. Cindie for next steps. Pt verbalized agreement to plan.

## 2023-07-17 ENCOUNTER — Encounter: Payer: Self-pay | Admitting: Behavioral Health

## 2023-07-17 ENCOUNTER — Ambulatory Visit (INDEPENDENT_AMBULATORY_CARE_PROVIDER_SITE_OTHER): Admitting: Behavioral Health

## 2023-07-17 ENCOUNTER — Other Ambulatory Visit (HOSPITAL_BASED_OUTPATIENT_CLINIC_OR_DEPARTMENT_OTHER): Payer: Self-pay | Admitting: *Deleted

## 2023-07-17 DIAGNOSIS — F4323 Adjustment disorder with mixed anxiety and depressed mood: Secondary | ICD-10-CM

## 2023-07-17 DIAGNOSIS — E875 Hyperkalemia: Secondary | ICD-10-CM | POA: Diagnosis not present

## 2023-07-17 DIAGNOSIS — Z09 Encounter for follow-up examination after completed treatment for conditions other than malignant neoplasm: Secondary | ICD-10-CM | POA: Diagnosis not present

## 2023-07-17 NOTE — Progress Notes (Signed)
 [facility] Counselor/Therapist Progress Note  Patient ID: Shannon [name], MRN: [id number],    Date:,  July 17, 2023  Time Spent: 55 minutes, 3:00pm until 355 PM this session was held via video teletherapy. The patient consented to the video teletherapy and was located in her home during this session. She is aware it is the responsibility of the patient to secure confidentiality on her end of the session. The provider was in a private home office for the duration of this session.     Reported Symptoms: Anxiety, depression  Mental Status Exam: Appearance:  Well Groomed     Behavior: Appropriate  Motor: Normal  Speech/Language:  Clear and Coherent  Affect: Appropriate for  Mood: normal  Thought process: normal  Thought content:   WNL  Sensory/Perceptual disturbances:   WNL  Orientation: oriented to person, place, time/date, situation, day of week, month of year, and year  Attention: Good  Concentration: Good  Memory: WNL  Fund of knowledge:  Good  Insight:   Good  Judgment:  Good  Impulse Control: Good   Risk Assessment: Danger to Self:  No Self-injurious Behavior: No Danger to Others: No Duty to Warn:no Physical Aggression / Violence:No  Access to Firearms a concern: No  Gang Involvement:No   Subjective: It has been a difficult few weeks with the patient.  She has not felt well for several weeks having little energy and difficulty breathing.  Early last week she started to feel worse and I did call the ambulance having very difficult time getting her heart back and rhythm even when she got to the hospital.  They had shot her heart 3 times and finally the third time got it back and rhythm.  They took her off of one of the medications and that has made her feel somewhat better.  They gave her dye for one of the scans that were taken and she had an anaphylactic shock reaction to that.  Even after they got a heart back and rhythm she said she came home feeling  exhausted in today's first day she felt somewhat like she is going to make some positive progress.  She says she is trying to stay and be positive but it almost feels as if she has been sneaking bit this feels like negative things health and otherwise continue to happen.  She continues to allow her faith and I encouraged her to try to think positively while at the same time resting and recuperating as much as she can. She does contract for safety having no thoughts of hurting herself or anyone else. Interventions: Cognitive Behavioral Therapy  Diagnosis: Adjustment disorder with mixed anxiety and depressed mood.  Plan: I will meet with the patient every 2 weeks via video session TX. Plan:To use cognitive behavioral therapy principles as well as elements of dialectical behavior therapy.  Goals are to reduce anxiety and depression by at least 50% with a target date of November 06, 2022.  Goals are to have less sadness as indicated by PHQ-9 scores as well as patient report.  We also work on improving mood and return to a healthier level of functioning as defined by her goals for being happy, identify causes and process triggers for depressed mood.  We will use cognitive behavioral therapy to explore and replace unhealthy thoughts and behavior patterns contributing to depression.  I will continue to encourage shearing of feelings related to the causes and symptoms of depression as well as teach and encouraged use  of coping skills for management of depressive symptoms.  We also will work to improve the patient's ability to manage anxiety symptoms and better handle stress, identify causes for anxiety and explore ways for reduction of anxiety in addition to resolving conflicts contributing to anxiety and managing thoughts and worrisome thinking is contributing to anxiety.  Interventions will include providing education about anxiety, facilitate problem solution skills, teaching coping skills for managing anxiety  such as grounding exercises, progressive muscle relaxation and cognitive re framing etc.  We will also use cognitive behavioral therapy to identify and change anxiety provoking thoughts and behavior patterns as well as using DBT distress tolerance and mindfulness skills. I reviewed the treatment plan goals with the patient who agreed to continue with goals as stated above. Progress: 35%New target date will be October 31st, 2025. Shannon [name], [facility]                                                Shannon [name], [facility]               Shannon [name], [facility]               Shannon [name], [facility]               Shannon [name], [facility]               Shannon [name], [facility]               Shannon [name], [facility]               Shannon [name], [facility]               Shannon [name], [facility]               Shannon [name], [facility]

## 2023-07-18 ENCOUNTER — Other Ambulatory Visit: Payer: Self-pay

## 2023-07-18 ENCOUNTER — Encounter (HOSPITAL_COMMUNITY): Payer: Self-pay

## 2023-07-18 ENCOUNTER — Emergency Department (HOSPITAL_COMMUNITY)

## 2023-07-18 ENCOUNTER — Emergency Department (HOSPITAL_COMMUNITY)
Admission: EM | Admit: 2023-07-18 | Discharge: 2023-07-18 | Disposition: A | Discharge status: Home / Self Care | Attending: Emergency Medicine | Admitting: Emergency Medicine

## 2023-07-18 DIAGNOSIS — R0789 Other chest pain: Secondary | ICD-10-CM | POA: Diagnosis not present

## 2023-07-18 DIAGNOSIS — I509 Heart failure, unspecified: Secondary | ICD-10-CM | POA: Diagnosis not present

## 2023-07-18 DIAGNOSIS — R918 Other nonspecific abnormal finding of lung field: Secondary | ICD-10-CM | POA: Diagnosis not present

## 2023-07-18 DIAGNOSIS — R0602 Shortness of breath: Secondary | ICD-10-CM | POA: Insufficient documentation

## 2023-07-18 DIAGNOSIS — Z743 Need for continuous supervision: Secondary | ICD-10-CM | POA: Diagnosis not present

## 2023-07-18 DIAGNOSIS — Z7901 Long term (current) use of anticoagulants: Secondary | ICD-10-CM | POA: Diagnosis not present

## 2023-07-18 DIAGNOSIS — R079 Chest pain, unspecified: Secondary | ICD-10-CM | POA: Diagnosis not present

## 2023-07-18 DIAGNOSIS — R6889 Other general symptoms and signs: Secondary | ICD-10-CM | POA: Diagnosis not present

## 2023-07-18 DIAGNOSIS — I499 Cardiac arrhythmia, unspecified: Secondary | ICD-10-CM | POA: Diagnosis not present

## 2023-07-18 LAB — BASIC METABOLIC PANEL WITH GFR
BUN/Creatinine Ratio: 15 (ref 12–28)
BUN: 13 mg/dL (ref 8–27)
CO2: 24 mmol/L (ref 20–29)
Calcium: 8.9 mg/dL (ref 8.7–10.3)
Chloride: 99 mmol/L (ref 96–106)
Creatinine, Ser: 0.87 mg/dL (ref 0.57–1.00)
Glucose: 104 mg/dL — ABNORMAL HIGH (ref 70–99)
Potassium: 4.6 mmol/L (ref 3.5–5.2)
Sodium: 138 mmol/L (ref 134–144)
eGFR: 71 mL/min/1.73 (ref >59)

## 2023-07-18 LAB — COMPREHENSIVE METABOLIC PANEL WITH GFR
ALT: 40 U/L (ref 0–44)
AST: 34 U/L (ref 15–41)
Albumin: 3.2 g/dL — ABNORMAL LOW (ref 3.5–5.0)
Alkaline Phosphatase: 66 U/L (ref 38–126)
Anion gap: 11 (ref 5–15)
BUN: 14 mg/dL (ref 8–23)
CO2: 25 mmol/L (ref 22–32)
Calcium: 9 mg/dL (ref 8.9–10.3)
Chloride: 99 mmol/L (ref 98–111)
Creatinine, Ser: 0.84 mg/dL (ref 0.44–1.00)
GFR, Estimated: >60 mL/min (ref >60)
Glucose, Bld: 125 mg/dL — ABNORMAL HIGH (ref 70–99)
Potassium: 3.6 mmol/L (ref 3.5–5.1)
Sodium: 135 mmol/L (ref 135–145)
Total Bilirubin: 0.9 mg/dL (ref 0.0–1.2)
Total Protein: 6.9 g/dL (ref 6.5–8.1)

## 2023-07-18 LAB — CBC WITH DIFFERENTIAL/PLATELET
Abs Immature Granulocytes: 0.04 K/uL (ref 0.00–0.07)
Basophils Absolute: 0.1 K/uL (ref 0.0–0.1)
Basophils Relative: 1 %
Eosinophils Absolute: 0.2 K/uL (ref 0.0–0.5)
Eosinophils Relative: 2 %
HCT: 32.9 % — ABNORMAL LOW (ref 36.0–46.0)
Hemoglobin: 10.4 g/dL — ABNORMAL LOW (ref 12.0–15.0)
Immature Granulocytes: 0 %
Lymphocytes Relative: 10 %
Lymphs Abs: 1.1 K/uL (ref 0.7–4.0)
MCH: 29.3 pg (ref 26.0–34.0)
MCHC: 31.6 g/dL (ref 30.0–36.0)
MCV: 92.7 fL (ref 80.0–100.0)
Monocytes Absolute: 0.7 K/uL (ref 0.1–1.0)
Monocytes Relative: 6 %
Neutro Abs: 9.8 K/uL — ABNORMAL HIGH (ref 1.7–7.7)
Neutrophils Relative %: 81 %
Platelets: 337 K/uL (ref 150–400)
RBC: 3.55 MIL/uL — ABNORMAL LOW (ref 3.87–5.11)
RDW: 13.9 % (ref 11.5–15.5)
WBC: 12 K/uL — ABNORMAL HIGH (ref 4.0–10.5)
nRBC: 0 % (ref 0.0–0.2)

## 2023-07-18 LAB — TROPONIN I (HIGH SENSITIVITY)
Troponin I (High Sensitivity): 54 ng/L — ABNORMAL HIGH (ref <18)
Troponin I (High Sensitivity): 51 ng/L — ABNORMAL HIGH (ref <18)

## 2023-07-18 LAB — BRAIN NATRIURETIC PEPTIDE: B Natriuretic Peptide: 268.9 pg/mL — ABNORMAL HIGH (ref 0.0–100.0)

## 2023-07-18 MED ORDER — FUROSEMIDE 10 MG/ML IJ SOLN
40.0000 mg | Freq: Once | INTRAMUSCULAR | Status: AC
  Administered 2023-07-18: 40 mg via INTRAVENOUS
  Filled 2023-07-18: qty 4

## 2023-07-18 NOTE — ED Provider Notes (Signed)
 [place] EMERGENCY DEPARTMENT AT [facility] Provider Note   CSN: [id number] Arrival date & time: 07/18/23  9276     Patient presents with: Chest Pain and Shortness of Breath   Shannon [name] is a 73 y.o. female.   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  Relevant history includes patient recently admitted last week after having developed URI symptoms which her other family members had that led to A-fib RVR, admission, medication changes and eventually DC cardioversion who was discharged home approximately 1 week ago.  She reports that yesterday was the first day she was starting to feel better but she had still been in A-fib.  She had been compliant with the new medications she was given and followed up with her doctor and had started Symbicort .  She was making pancakes and sausage last night by the grill and started to feel tightness in her chest and shortness of breath.  She did report breaking out in a sweat at 1 point but that to get better however her breathing and tightness in her chest never resolved.  She went to bed and woke up at 4 AM feeling like she was smothering and could not catch her breath.  She called 911 and they reported upon their arrival patient was hypertensive in the 200s systolic with normal heart rate and O2 sats of 93% on room air.  They reported globally decreased breath sounds especially on the left and she was treated with Solu-Medrol , albuterol , Atrovent, nitroglycerin .  Currently patient is complaining of some mild tightness mostly in the left side of her chest and some mild shortness of breath but reports overall she is feeling much better.   She is not having any nausea or vomiting.  No recent diarrhea.  She denies any lower extremity swelling.  She has continued to have a cough but reports it feels like it starting to break up over the last few days and denies any sputum or fever.  She is concerned that her abdomen is mildly distended and she may be retaining fluid.  The history is provided by the patient, medical records and the EMS personnel.  Chest Pain Associated symptoms: shortness of breath   Shortness of Breath Associated symptoms: chest pain        Prior to Admission medications   Medication Sig Start Date End Date Taking? Authorizing Provider  acetaminophen  (TYLENOL ) 500 MG tablet Take 1,000 mg by mouth every 6 (six) hours as needed for moderate pain or headache.    [provider]  benzonatate  (TESSALON ) 100 MG capsule Take 1 capsule (100 mg total) by mouth 3 (three) times daily as needed for cough. 06/30/23   [name], PA-C  budesonide -formoterol  (SYMBICORT ) 80-4.5 MCG/ACT inhaler Inhale 2 puffs into the lungs 2 (two) times daily. 07/13/23   [name], [name], FNP  busPIRone  (BUSPAR ) 5 MG tablet Take 1 tablet (5 mg total) by mouth 2 (two) times daily. 06/25/23   [name], NP  Cholecalciferol (VITAMIN D ) 50 MCG (2000 UT) CAPS Take 2,000 Units by mouth in the morning.    [provider]  ELIQUIS  5 MG TABS tablet TAKE 1 TABLET BY  MOUTH TWICE  DAILY 06/23/23   [name], MD  furosemide  (LASIX ) 20 MG tablet TAKE 1 TABLET BY MOUTH DAILY 03/05/23   [name], MD  Hydrocortisone (CORTIZONE-10 EX) Apply 1 application  topically as needed (skin irritation/itching).    [provider]  levalbuterol  (XOPENEX  HFA) 45 MCG/ACT inhaler Inhale 2 puffs into the lungs every 4 (four) hours as needed for wheezing. 06/16/23   [name], NP  loratadine (CLARITIN) 10 MG tablet Take 10 mg by mouth daily as needed for allergies.    [provider]  losartan  (COZAAR ) 25 MG  tablet Take 1 tablet (25 mg total) by mouth daily. 06/23/23 09/21/23  Riddle, [name], NP  metoprolol  succinate (TOPROL -XL) 100 MG 24 hr tablet Take 1 tablet (100 mg total) by mouth 2 (two) times daily. Take with or immediately following a meal. 07/09/23   [name], [name], MD  Multiple Vitamin (MULTIVITAMIN) tablet Take 1 tablet by mouth at bedtime.    [provider]  potassium chloride  20 MEQ TBCR Take 1 tablet (20 mEq total) by mouth daily. 02/04/23   Riddle, [name], NP  simvastatin  (ZOCOR ) 20 MG tablet TAKE 1 TABLET BY MOUTH DAILY 05/14/23   [name], NP    Allergies: Definity  [perflutren  lipid microsphere], Sulfur, Ace inhibitors, Amiodarone , Hctz [hydrochlorothiazide ], Oxycodone, Prednisone , Sulfa antibiotics, and Voltaren [diclofenac sodium]    Review of Systems  Respiratory:  Positive for shortness of breath.   Cardiovascular:  Positive for chest pain.    Updated Vital Signs BP (!) 167/85   Pulse 87   Temp 98.1 F (36.7 C) (Oral)   Resp 10   SpO2 94%   Physical Exam Vitals and nursing note reviewed.  Constitutional:      General: She is not in acute distress.    Appearance: She is well-developed.  HENT:     Head: Normocephalic and atraumatic.     Mouth/Throat:     Mouth: Mucous membranes are moist.  Eyes:     Pupils: Pupils are equal, round, and reactive to light.  Cardiovascular:     Rate and Rhythm: Normal rate and regular rhythm.     Heart sounds: Normal heart sounds. No murmur heard.    No friction rub.  Pulmonary:     Effort: Pulmonary effort is normal.     Breath sounds: Normal breath sounds. No wheezing or rales.  Chest:     Chest wall: No tenderness.  Abdominal:     General: Bowel sounds are normal. There is no distension.     Palpations: Abdomen is soft.     Tenderness: There is no abdominal tenderness. There is no guarding or rebound.  Musculoskeletal:        General: No tenderness. Normal range of motion.     Right lower leg: No edema.     Left  lower leg: No edema.     Comments: No edema  Skin:    General: Skin is warm and dry.     Findings: No rash.  Neurological:     Mental Status: She is alert and oriented to person, place, and time.     Cranial Nerves: No cranial nerve deficit.  Psychiatric:        Behavior: Behavior normal.     (all labs ordered are listed, but only abnormal results are displayed) Labs Reviewed  CBC WITH DIFFERENTIAL/PLATELET - Abnormal; Notable for the following components:      Result Value   WBC 12.0 (*)  RBC 3.55 (*)    Hemoglobin 10.4 (*)    HCT 32.9 (*)    Neutro Abs 9.8 (*)    All other components within normal limits  COMPREHENSIVE METABOLIC PANEL WITH GFR - Abnormal; Notable for the following components:   Glucose, Bld 125 (*)    Albumin 3.2 (*)    All other components within normal limits  BRAIN NATRIURETIC PEPTIDE - Abnormal; Notable for the following components:   B Natriuretic Peptide 268.9 (*)    All other components within normal limits  TROPONIN I (HIGH SENSITIVITY) - Abnormal; Notable for the following components:   Troponin I (High Sensitivity) 54 (*)    All other components within normal limits  TROPONIN I (HIGH SENSITIVITY) - Abnormal; Notable for the following components:   Troponin I (High Sensitivity) 51 (*)    All other components within normal limits    EKG: EKG Interpretation Date/Time:  Saturday July 18 2023 07:32:02 EDT Ventricular Rate:  84 PR Interval:  144 QRS Duration:  85 QT Interval:  443 QTC Calculation: 524 R Axis:   63  Text Interpretation: Sinus rhythm Nonspecific repol abnormality, diffuse leads new  Prolonged QT interval Confirmed by Doretha Folks ([id number]) on 07/18/2023 9:00:34 AM  Radiology: [facility] Chest Port 1 View Result Date: 07/18/2023 CLINICAL DATA:  Shortness of breath and chest tightness. EXAM: PORTABLE CHEST 1 VIEW COMPARISON:  07/06/2023 FINDINGS: The cardio pericardial silhouette is enlarged. Diffuse interstitial opacity suggests  edema. Left-sided permanent pacemaker noted. No acute bony abnormality. Telemetry leads overlie the chest. IMPRESSION: Enlargement of the cardiopericardial silhouette with diffuse interstitial opacity suggesting edema. Electronically Signed   By: Camellia Candle M.D.   On: 07/18/2023 08:31     Procedures   Medications Ordered in the ED  furosemide  (LASIX ) injection 40 mg (40 mg Intravenous Given 07/18/23 0946)                                    Medical Decision Making Amount and/or Complexity of Data Reviewed Independent Historian: EMS External Data Reviewed: notes. Labs: ordered. Decision-making details documented in ED Course. Radiology: ordered and independent interpretation performed. Decision-making details documented in ED Course. ECG/medicine tests: ordered and independent interpretation performed. Decision-making details documented in ED Course.  Risk Prescription drug management.   Pt with multiple medical problems and comorbidities and presenting today with a complaint that caries a high risk for morbidity and mortality.  Here today with the above symptoms.  Concern for dysrhythmia, worsening infection, persistent bronchitis, hypertensive urgency, CHF, pneumothorax, ACS.  Lower suspicion for dissection, PE, acute abdominal pathology.  Currently patient reports feeling much better but not back to her baseline yet.  She is on continuous cardiac monitoring which I independently interpreted and currently is in sinus rhythm.  12:13 PM I independently interpreted patient's EKG and labs.  EKG without acute changes in sinus rhythm.  Troponin is flat at 54 and then 51, CBC with improving leukocytosis of 12, hemoglobin of 10.4 which is slightly decreased from her prior 1 of 11, CMP within normal limits, BNP at 268 which is a bit higher than her prior. I have independently visualized and interpreted pt's images today.  Chest x-ray with some mild pulmonary edema but no other acute findings.   Feel that patient is most likely fluid overloaded especially given the distention she feels in her abdomen and shortness of breath that was worse with lying down.  She was given a dose of Lasix  here and urinated 3 separate times.  She has been off oxygen and is looking much better.  She was able to ambulate around the department several times without feeling short of breath.  Blood pressure has also improved.  At this time also interrogated patient's pacemaker which showed 1 episode of nonsustained V. tach at 11 PM last night but no other events.  She has had intermittent A-fib as well which she was already aware of.  At this time feel that it is reasonable to allow patient to go home and increase her Lasix  for the next 4 days and then follow-up with her cardiologist.  Discussed this with the patient and her daughter.  They are comfortable with this plan.  Did give her return precautions.       Final diagnoses:  None    ED Discharge Orders     None          Doretha Folks, MD 07/18/23 1213

## 2023-07-18 NOTE — ED Notes (Signed)
 Patient ambulated around nurses station x 2 with no increase in work of breathing, fatigue or dizziness. Patient states she feels much improved than when she was walking around her home yesterday. Provider saw patient and was pleased with how well she was doing. Upon getting vitals after ambulating, heart rate in 90s sinus rhythm and o2 was 96% on room air

## 2023-07-18 NOTE — Discharge Instructions (Signed)
 Overall the blood work is improving but does look like you have a little extra fluid.  Increase your Lasix  for the next 3 to 4 days to 2 pills and then go back down to your normal dose.  Keep an eye on your weight.  Continue to get up and move around but do not overdo it.  If your symptoms start getting worse return to the emergency room.

## 2023-07-18 NOTE — ED Triage Notes (Signed)
 Patient arrives via [facility] for chest pain described as tightness and shortness of breath from home. Patient woke up at 0400 this am with difficulty breathing. Patient diminished in right and left, not moving left, right side diminished, wheezing and exertional SHOB. On lasix . Does not usually wear supplemental O2. Endorses difficulty with interdisciplinary provider communication. 18 LAC, alert and oriented. Arrives on a neb. 92 on room air.   10 albuterol  0.5 atrovent 125 solumderol No ASA due to ulcer 3 nitroglycerin   EMS vitals 140/86. Initially 200/110 HR 80 with PVCs, run of SVT but converted out on own

## 2023-07-19 ENCOUNTER — Ambulatory Visit (HOSPITAL_BASED_OUTPATIENT_CLINIC_OR_DEPARTMENT_OTHER): Payer: Self-pay | Admitting: Family Medicine

## 2023-07-19 NOTE — Progress Notes (Signed)
 Hi Xinyi,  Potassium is normal. We will plan to recheck at the next appt. Kidney function is stable.

## 2023-07-20 NOTE — Progress Notes (Unsigned)
 Cardiology Office Note:  .   Date:  07/21/2023  ID:  Shannon [name], DOB 1950/05/11, MRN [id number] PCP: Knute [name], FNP  [facility] Providers Cardiologist:  None Electrophysiologist:  OLE ONEIDA HOLTS, MD {  History of Present Illness: .   Shannon [name] is a 73 y.o. female with history of persistent atrial fibrillation status post ablation 12/2021, SSS status post PPM (Medtr[employer] 03/2023), heart failure with with reduced EF, hypertension, OSA on CPAP, drug-induced lupus, CVA     Persistent atrial fibrillation Diagnosed 2017  Status post 3 ablations.  Last being ablation 12/2021. Flecainide  stopped due to fatigue.  Failed Tikosyn  due to QT prolongation.  Intolerant to amiodarone  due to blurry vision and fatigue shortness of breath. Admitted 07/2023 for recurrent A-fib RVR after recent viral infection.  In the ED he had 2 unsuccessful DCCV's at 200 J.  EP had recommended increasing to 360 J and had successful cardioversion in the lab.  EF 30 to 35% with biatrial enlargement. Has refused amiodarone  reporting intolerance.  HFrEF EF 60 to 65% 10/2022 NM PET/CT 06/2023, EF reduced 38%.  No wall motion abnormalities.  Flow reserve 1.77 moderate coronary calcifications.  No evidence of ischemia.  Low risk study. 07/2023 EF 30 to 35% in the setting of A-fib RVR. Possibly tachycardia mediated  Social history  Lives at home with her daughter     Patient with history of persistent atrial fibrillation status post multiple ablations and failed antiarrhythmic history with recurrence and admission 07/2023 with recent viral infection who underwent 2 failed DCCV's and finally had successful cardioversion at 360 J.  Today patient presents for follow-up after hospitalization.  She reports that she the past 2 to 3 weeks has been getting over a viral infection and after using her Symbicort  inhaler she shortly after went into atrial fibrillation.  When she is in A-fib she can  feel dizziness and palpitations.  However today she is in sinus rhythm and atrially paced.  She is still struggling to get over her home and still has persistent cough but has close follow-up with PCP.  Did have some issues with shortness of breath where she did take double her Lasix  40 mg for 3 days which resolved her shortness of breath.  Does not describe any peripheral edema or orthopnea, chest pain.  ROS: Denies: Chest pain, shortness of breath, orthopnea, peripheral edema, palpitations, decreased exercise intolerance, fatigue, lightheadedness.   Studies Reviewed: SABRA    EKG Interpretation Date/Time:  Tuesday July 21 2023 09:05:26 EDT Ventricular Rate:  83 PR Interval:  152 QRS Duration:  96 QT Interval:  420 QTC Calculation: 493 R Axis:   -40  Text Interpretation: Atrial-paced rhythm Left axis deviation Incomplete right bundle branch block Confirmed by Darryle Currier [id number]) on 07/21/2023 9:39:26 AM    Risk Assessment/Calculations:    CHA2DS2-VASc Score = 6   This indicates a 9.7% annual risk of stroke. The patient's score is based upon: CHF History: 1 HTN History: 1 Diabetes History: 0 Stroke History: 2 Vascular Disease History: 0 Age Score: 1 Gender Score: 1       Physical Exam:   VS:  BP (!) 160/76   Pulse 83   Ht 5' 7 (1.702 m)   Wt 190 lb (86.2 kg)   SpO2 92%   BMI 29.76 kg/m    Wt Readings from Last 3 Encounters:  07/21/23 190 lb (86.2 kg)  07/13/23 190 lb 14.4 oz (86.6 kg)  07/13/23 190 lb  6.4 oz (86.4 kg)    GEN: Well nourished, well developed in no acute distress.  Cough NECK: No JVD; No carotid bruits CARDIAC: RRR, no murmurs, rubs, gallops RESPIRATORY:  Clear to auscultation without rales, wheezing or rhonchi  ABDOMEN: Soft, non-tender, non-distended EXTREMITIES:  No edema; No deformity   ASSESSMENT AND PLAN: .     Persistent atrial fibrillation She has had 3 prior ablations and failed Tikosyn .  Sometimes aware when she is in atrial fibrillation and  notes palpitations/dizziness.  She had recurrence of atrial fibrillation 07/2023 in the setting of URI and underwent 2 unsuccessful DCCV's in the ER and then successful DCCV at 360 J.  Today she is in sinus rhythm, a paced. EP to decide ongoing plans, possible ablation again. Failed Tikosyn , flecainide , does not want to be on amiodarone  and didn't tolerate this before.  Continue with Eliquis  5 mg twice daily, she did report missing 1 dose on the 7th by accident.  Discussed importance of being extra diligent especially after cardioversion. Continue with Toprol -XL 100 mg twice daily. TSH was normal 4 months ago  Chronic HFrEF Potentially could be tachycardia mediated.  Echocardiogram from recent admission demonstrates EF 30 to 35% with global hypokinesis, normal RV function.  Moderately dilated biatrial.  No significant valvular disease.  Has been having some shortness of breath that probably is exacerbated by underlying viral infection.  Looks euvolemic and previously without any issues of volume. GDMT: Start Entresto  24-26 mg twice daily, stop losartan , start Jardiance  10 mg.  Continue with Toprol -XL 100 mg twice daily, Lasix  20 mg.  Repeat BMP 2 weeks from now.  She is also getting potassium supplementation 20 mEq daily.  NM PET/CT this year demonstrating decreased myocardial flow reserve 0.72, moderate coronary artery calcifications but overall felt to be low risk study. Continue with daily weights and blood pressure measurements.  She will give me an update in about a week or 2. Repeat limited echocardiogram 3 months from now.  Additionally see note from 07/07/18/2025.  Appears that she had some sort of reaction to the contrast given during the echocardiogram and became unresponsive.  Was treated for allergic reaction. Will discuss with MD about any recommendations but in the future may just need to do without contrast.  SSS status post Medtr[employer] PPM EP is following this.  Normal device check  06/2023  Hyperlipidemia LDL 62 October 2024 Continue with simvastatin  20 mg daily.  OSA on CPAP Compliant  URI Continues to have persistent cough however PCP is managing this.  Hypertension Manage in the context above.  Today 160 systolic.  She will let me update me in about 1 to 2 weeks about measurements.     Dispo: She will see EP tomorrow.  I will see her in approximately 1 month, listed as Shannon [name] but I will see patient.  Discussed with Dr. Loni. AVOID CONTRAST ALTOGETHER.  Allergy list has been updated but this should be documented on every cardiology note in the future.   Signed, Thom LITTIE Sluder, PA-C

## 2023-07-21 ENCOUNTER — Encounter: Payer: Self-pay | Admitting: Physician Assistant

## 2023-07-21 ENCOUNTER — Ambulatory Visit: Attending: Physician Assistant | Admitting: Cardiology

## 2023-07-21 ENCOUNTER — Ambulatory Visit

## 2023-07-21 ENCOUNTER — Encounter: Payer: Self-pay | Admitting: Cardiology

## 2023-07-21 VITALS — BP 160/76 | HR 83 | Ht 67.0 in | Wt 190.0 lb

## 2023-07-21 DIAGNOSIS — G4733 Obstructive sleep apnea (adult) (pediatric): Secondary | ICD-10-CM

## 2023-07-21 DIAGNOSIS — I5020 Unspecified systolic (congestive) heart failure: Secondary | ICD-10-CM

## 2023-07-21 DIAGNOSIS — I4819 Other persistent atrial fibrillation: Secondary | ICD-10-CM

## 2023-07-21 DIAGNOSIS — I495 Sick sinus syndrome: Secondary | ICD-10-CM

## 2023-07-21 DIAGNOSIS — Z79899 Other long term (current) drug therapy: Secondary | ICD-10-CM

## 2023-07-21 DIAGNOSIS — E782 Mixed hyperlipidemia: Secondary | ICD-10-CM

## 2023-07-21 MED ORDER — EMPAGLIFLOZIN 10 MG PO TABS
10.0000 mg | ORAL_TABLET | Freq: Every day | ORAL | 3 refills | Status: AC
Start: 2023-07-21 — End: ?

## 2023-07-21 MED ORDER — SACUBITRIL-VALSARTAN 24-26 MG PO TABS: 1.0000 | ORAL_TABLET | Freq: Two times a day (BID) | ORAL | 2 refills | Status: DC

## 2023-07-21 NOTE — Patient Instructions (Signed)
 Medication Instructions:  STOP LOSARTAN  START JARDIANCE  10 MG DAILY START ENTRESTO  24-26 MG TWICE DAILY *If you need a refill on your cardiac medications before your next appointment, please call your pharmacy*  Lab Work: BMET IN 2 WEEKS If you have labs (blood work) drawn today and your tests are completely normal, you will receive your results only by: MyChart Message (if you have MyChart) OR A paper copy in the mail If you have any lab test that is abnormal or we need to change your treatment, we will call you to review the results.  Testing/Procedures:[address] - IN OCTOBER 2025 Your physician has requested that you have an echocardiogram. Echocardiography is a painless test that uses sound waves to create images of your heart. It provides your doctor with information about the size and shape of your heart and how well your heart's chambers and valves are working. This procedure takes approximately one hour. There are no restrictions for this procedure. Please do NOT wear cologne, perfume, aftershave, or lotions (deodorant is allowed). Please arrive 15 minutes prior to your appointment time.  Please note: We ask at that you not bring children with you during ultrasound (echo/ vascular) testing. Due to room size and safety concerns, children are not allowed in the ultrasound rooms during exams. Our front office staff cannot provide observation of children in our lobby area while testing is being conducted. An adult accompanying a patient to their appointment will only be allowed in the ultrasound room at the discretion of the ultrasound technician under special circumstances. We apologize for any inconvenience.   Follow-Up: At [facility], you and your health needs are our priority.  As part of our continuing mission to provide you with exceptional heart care, our providers are all part of one team.  This team includes your primary Cardiologist (physician) and Advanced  Practice Providers or APPs (Physician Assistants and Nurse Practitioners) who all work together to provide you with the care you need, when you need it.  Your next appointment:   KEEP FOLLOW AUGUST 2025  Provider:   Josefa Beauvais, NP/ Thom Sluder, PA-C   Other Instructions CHECK WEIGHT AND BLOOD PRESSURE DAILY AND KEEP LOG OF READINGS, AFTER 1 WEEK SEND READINGS VIA MYCHART

## 2023-07-21 NOTE — Progress Notes (Unsigned)
 Electrophysiology Office Follow up Visit Note:    Date:  07/22/2023   ID:  Shannon [name], DOB June 23, 1950, MRN [id number]  PCP:  Knute [name], FNP  [facility] Cardiologist:  None  [facility] Electrophysiologist:  OLE ONEIDA HOLTS, MD    Interval History:     Shannon [name] is a 73 y.o. female who presents for a follow up visit.   She is well-known to me from the outpatient setting.  She has a long history of atrial fibrillation with multiple prior catheter ablations.  She has previously failed Tikosyn  and amiodarone .  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  Her episodes of atrial fibrillation are highly symptomatic.  She last saw Elvie on June 09, 2023.  At that appointment she reported intermittent chest pressure and shortness of breath with exertion.  A PET stress was ordered.  [name] is with her son today in clinic.  She has felt poorly for the last 3 weeks because of a viral illness.  She still has a residual cough productive of phlegm.  She is slowly improving but still feels weak.  She has been in normal rhythm for several days now but did have nearly 2 weeks straight of arrhythmia in the setting of the illness.      Past medical, surgical, social and family history were reviewed.  ROS:   Please see the history of present illness.    All other systems reviewed and are negative.  EKGs/Labs/Other Studies Reviewed:    The following studies were reviewed today:  July 07, 2023 echo EF 30-35 RV normal Moderately dilated left and right atrium  July 22, 2023 in-clinic device interrogation personally reviewed Battery longevity 13.6 years Lead parameters stable Increased A-fib burden over the last few weeks, ventricular rates 100-170 during A-fib Atrial pacing 52% Ventricular pacing 0.2% No programming changes made today  July 21, 2023 EKG shows an atrial paced rhythm.  Narrow QRS.      Physical Exam:    VS:  BP (!) 154/80 (BP  Location: Left Arm, Patient Position: Sitting, Cuff Size: Normal)   Pulse 82   Ht 5' 7 (1.702 m)   Wt 192 lb (87.1 kg)   SpO2 97%   BMI 30.07 kg/m     Wt Readings from Last 3 Encounters:  07/22/23 192 lb (87.1 kg)  07/21/23 190 lb (86.2 kg)  07/13/23 190 lb 14.4 oz (86.6 kg)     GEN: no distress CARD: RRR, No MRG.  Prepectoral pocket well-healed. RESP: No IWOB.  Some crackles/rhonchi in bilateral lower lung fields.      ASSESSMENT:    1. Persistent atrial fibrillation (HCC)   2. HFrEF (heart failure with reduced ejection fraction) (HCC)    PLAN:    In order of problems listed above:  #Persistent atrial fibrillation Recurrent episodes despite multiple prior catheter ablation.  Previously failed amiodarone  and Tikosyn .  Her episodes are highly symptomatic.  We discussed treatment options.  I do not think a repeat catheter ablation is the next right step.  I discussed treatment options including trialing antiarrhythmic drugs again versus pursuing AV nodal ablation.  She has a left bundle lead in place and had a good LVAT at the time of implant.  I discussed the potential need for future upgrade of her device to CRT.  I discussed upgrading it at the time of AV junction ablation versus waiting and letting her pace from the left bundle lead with close monitoring of  her ejection fraction.    For now, we are going to allow her to recover from her viral infection.  She is going to also start her Entresto  and Jardiance .  Hopefully this helps cooled off some of her atrial fibrillation.  If she continues to experience highly symptomatic episodes of atrial fibrillation, she would pursue AV junction ablation.  I will plan to see her back in clinic in about 8 weeks to review progress and clinical status.   #Chronic systolic heart failure EF newly reduced to 30 to 35% on July echo. She should continue taking Entresto , metoprolol , Lasix , Jardiance .  Follow-up 8 weeks with  me  Signed, Ole Holts, MD, [facility], [facility] 07/22/2023 10:09 AM    Electrophysiology [facility]

## 2023-07-22 ENCOUNTER — Encounter: Payer: Self-pay | Admitting: Cardiology

## 2023-07-22 ENCOUNTER — Ambulatory Visit: Attending: Cardiology | Admitting: Cardiology

## 2023-07-22 ENCOUNTER — Other Ambulatory Visit: Payer: Self-pay

## 2023-07-22 VITALS — BP 154/80 | HR 82 | Ht 67.0 in | Wt 192.0 lb

## 2023-07-22 DIAGNOSIS — I4819 Other persistent atrial fibrillation: Secondary | ICD-10-CM

## 2023-07-22 DIAGNOSIS — I5020 Unspecified systolic (congestive) heart failure: Secondary | ICD-10-CM | POA: Diagnosis not present

## 2023-07-22 LAB — CUP PACEART REMOTE DEVICE CHECK
Battery Remaining Longevity: 164 mo
Battery Voltage: 3.19 V
Brady Statistic AP VP Percent: 0.21 %
Brady Statistic AP VS Percent: 52.37 %
Brady Statistic AS VP Percent: 0.01 %
Brady Statistic AS VS Percent: 47.41 %
Brady Statistic RA Percent Paced: 52.9 %
Brady Statistic RV Percent Paced: 0.22 %
Date Time Interrogation Session: 20250714184519
Implantable Lead Connection Status: 753985
Implantable Lead Connection Status: 753985
Implantable Lead Implant Date: 20250303
Implantable Lead Implant Date: 20250303
Implantable Lead Location: 753859
Implantable Lead Location: 753860
Implantable Lead Model: 3830
Implantable Lead Model: 5076
Implantable Pulse Generator Implant Date: 20250303
Lead Channel Impedance Value: 285 Ohm
Lead Channel Impedance Value: 399 Ohm
Lead Channel Impedance Value: 532 Ohm
Lead Channel Impedance Value: 532 Ohm
Lead Channel Pacing Threshold Amplitude: 0.75 V
Lead Channel Pacing Threshold Amplitude: 0.875 V
Lead Channel Pacing Threshold Pulse Width: 0.4 ms
Lead Channel Pacing Threshold Pulse Width: 0.4 ms
Lead Channel Sensing Intrinsic Amplitude: 2.25 mV
Lead Channel Sensing Intrinsic Amplitude: 2.25 mV
Lead Channel Sensing Intrinsic Amplitude: 3.5 mV
Lead Channel Sensing Intrinsic Amplitude: 3.5 mV
Lead Channel Setting Pacing Amplitude: 1.5 V
Lead Channel Setting Pacing Amplitude: 1.5 V
Lead Channel Setting Pacing Pulse Width: 0.4 ms
Lead Channel Setting Sensing Sensitivity: 1.2 mV
Zone Setting Status: 755011
Zone Setting Status: 755011

## 2023-07-22 NOTE — Telephone Encounter (Signed)
 Pt of Dr. Cindie was seen yesterday by Thom Sluder PA. This RX was prescribed. Pharmacy is requesting clarification as Pt is allergic to ACE Inhibitors. Please advise on this.

## 2023-07-22 NOTE — Patient Instructions (Addendum)
 Medication Instructions:  Your physician recommends that you continue on your current medications as directed. Please refer to the Current Medication list given to you today.  *If you need a refill on your cardiac medications before your next appointment, please call your pharmacy*  Lab Work: None ordered.  You may go to any [facility] Location for your lab work:  [facility] - [address] ([facility]) - [address] [phone] [address]  [place] - [address]   [place]  - [address]   [place] - [address] - [address] [employer]  - [address] - [address] ([facility]   If you have labs (blood work) drawn today and your tests are completely normal, you will receive your results only by: [employer] (if you have MyChart)  If you have any lab test that is abnormal or we need to change your treatment, we will call you or send a MyChart message to review the results.  Testing/Procedures: None ordered.  Follow-Up: At [facility], you and your health needs are our priority.  As part of our continuing mission to provide you with exceptional heart care, we have created designated Provider Care Teams.  These Care Teams include your primary Cardiologist (physician) and Advanced Practice Providers (APPs -  Physician Assistants and Nurse Practitioners) who all work together to provide you with the care you need, when you need it.  Your next appointment:   8 weeks  The format for your next appointment:   In Person  Provider:   Ole Holts, MD  Note: Remote monitoring is used to monitor your Pacemaker/ ICD from home. This monitoring reduces the number of office visits required to check your device to one time per year. It allows us  to keep an eye on the functioning of your device to ensure it is working properly.

## 2023-07-23 ENCOUNTER — Other Ambulatory Visit (HOSPITAL_BASED_OUTPATIENT_CLINIC_OR_DEPARTMENT_OTHER): Payer: Self-pay | Admitting: Family Medicine

## 2023-07-23 DIAGNOSIS — Z09 Encounter for follow-up examination after completed treatment for conditions other than malignant neoplasm: Secondary | ICD-10-CM

## 2023-07-24 ENCOUNTER — Encounter: Payer: Self-pay | Admitting: Behavioral Health

## 2023-07-24 ENCOUNTER — Ambulatory Visit: Admitting: Behavioral Health

## 2023-07-24 DIAGNOSIS — F4323 Adjustment disorder with mixed anxiety and depressed mood: Secondary | ICD-10-CM

## 2023-07-24 NOTE — Progress Notes (Signed)
 [facility] Counselor/Therapist Progress Note  Patient ID: Shannon [name], MRN: [id number],    Date:,  July 24, 2023  Time Spent: 46 minutes, 3:00pm until 3:46 PM this session was held via video teletherapy. The patient consented to the video teletherapy and was located in her home during this session. She is aware it is the responsibility of the patient to secure confidentiality on her end of the session. The provider was in a private home office for the duration of this session.     Reported Symptoms: Anxiety, depression  Mental Status Exam: Appearance:  Well Groomed     Behavior: Appropriate  Motor: Normal  Speech/Language:  Clear and Coherent  Affect: Appropriate for  Mood: normal  Thought process: normal  Thought content:   WNL  Sensory/Perceptual disturbances:   WNL  Orientation: oriented to person, place, time/date, situation, day of week, month of year, and year  Attention: Good  Concentration: Good  Memory: WNL  Fund of knowledge:  Good  Insight:   Good  Judgment:  Good  Impulse Control: Good   Risk Assessment: Danger to Self:  No Self-injurious Behavior: No Danger to Others: No Duty to Warn:no Physical Aggression / Violence:No  Access to Firearms a concern: No  Gang Involvement:No   Subjective: The patient has seen a little improvement but still does not feel well.  There were a couple nights where she said she was breathing horribly and had difficulty sleeping but that has not happened in the last couple of nights.  She did meet with cardiologist and discuss options but they do not want to implement it any them until she can push past this spiral yuck that she has been fighting for weeks.  Her biggest concern is coughing because she knows that is not good for her heart but she is doing everything she can with the medication that she has been prescribed.  Otherwise her outlook is very positive.  They sold the house that they had been living in.   For the most part there has been no contact and there is no connection with she and her husband.  He does have some contact with her children but she intentionally is staying out of those conversations.  They are in a good place with her daughter and grandson her son and his family and they are very supportive.  It is frustrating for her to not feel well and not be able to do things but she knows that she has to get well and her daughter is very supportive of that.  She does contract for safety having no thoughts of hurting herself or anyone else. Interventions: Cognitive Behavioral Therapy  Diagnosis: Adjustment disorder with mixed anxiety and depressed mood.  Plan: I will meet with the patient every 2 weeks via video session TX. Plan:To use cognitive behavioral therapy principles as well as elements of dialectical behavior therapy.  Goals are to reduce anxiety and depression by at least 50% with a target date of November 06, 2022.  Goals are to have less sadness as indicated by PHQ-9 scores as well as patient report.  We also work on improving mood and return to a healthier level of functioning as defined by her goals for being happy, identify causes and process triggers for depressed mood.  We will use cognitive behavioral therapy to explore and replace unhealthy thoughts and behavior patterns contributing to depression.  I will continue to encourage shearing of feelings related to the causes and symptoms  of depression as well as teach and encouraged use of coping skills for management of depressive symptoms.  We also will work to improve the patient's ability to manage anxiety symptoms and better handle stress, identify causes for anxiety and explore ways for reduction of anxiety in addition to resolving conflicts contributing to anxiety and managing thoughts and worrisome thinking is contributing to anxiety.  Interventions will include providing education about anxiety, facilitate problem solution  skills, teaching coping skills for managing anxiety such as grounding exercises, progressive muscle relaxation and cognitive re framing etc.  We will also use cognitive behavioral therapy to identify and change anxiety provoking thoughts and behavior patterns as well as using DBT distress tolerance and mindfulness skills. I reviewed the treatment plan goals with the patient who agreed to continue with goals as stated above. Progress: 35%New target date will be October 31st, 2025. Lorrene CHRISTELLA Hasten, [facility]                                                Lorrene CHRISTELLA Hasten, [facility]               Lorrene CHRISTELLA Hasten, [facility]               Lorrene CHRISTELLA Hasten, [facility]               Lorrene CHRISTELLA Hasten, [facility]               Lorrene CHRISTELLA Hasten, [facility]               Lorrene CHRISTELLA Hasten, [facility]               Lorrene CHRISTELLA Hasten, [facility]               Lorrene CHRISTELLA Hasten, [facility]               Lorrene CHRISTELLA Hasten, [facility]               Lorrene CHRISTELLA Hasten, [facility]

## 2023-07-25 ENCOUNTER — Ambulatory Visit: Payer: Self-pay | Admitting: Cardiology

## 2023-07-30 ENCOUNTER — Ambulatory Visit (HOSPITAL_BASED_OUTPATIENT_CLINIC_OR_DEPARTMENT_OTHER)

## 2023-07-30 ENCOUNTER — Emergency Department (HOSPITAL_COMMUNITY)

## 2023-07-30 ENCOUNTER — Encounter (HOSPITAL_COMMUNITY): Payer: Self-pay

## 2023-07-30 ENCOUNTER — Emergency Department (HOSPITAL_COMMUNITY)
Admission: EM | Admit: 2023-07-30 | Discharge: 2023-07-30 | Disposition: A | Discharge status: Home / Self Care | Attending: Emergency Medicine | Admitting: Emergency Medicine

## 2023-07-30 ENCOUNTER — Encounter (HOSPITAL_BASED_OUTPATIENT_CLINIC_OR_DEPARTMENT_OTHER): Payer: Self-pay

## 2023-07-30 ENCOUNTER — Other Ambulatory Visit: Payer: Self-pay

## 2023-07-30 DIAGNOSIS — Z8673 Personal history of transient ischemic attack (TIA), and cerebral infarction without residual deficits: Secondary | ICD-10-CM | POA: Diagnosis not present

## 2023-07-30 DIAGNOSIS — Z950 Presence of cardiac pacemaker: Secondary | ICD-10-CM | POA: Diagnosis not present

## 2023-07-30 DIAGNOSIS — I700 Atherosclerosis of aorta: Secondary | ICD-10-CM | POA: Diagnosis not present

## 2023-07-30 DIAGNOSIS — I4891 Unspecified atrial fibrillation: Secondary | ICD-10-CM | POA: Diagnosis not present

## 2023-07-30 DIAGNOSIS — M79603 Pain in arm, unspecified: Secondary | ICD-10-CM | POA: Insufficient documentation

## 2023-07-30 DIAGNOSIS — R002 Palpitations: Secondary | ICD-10-CM | POA: Diagnosis not present

## 2023-07-30 DIAGNOSIS — R079 Chest pain, unspecified: Secondary | ICD-10-CM | POA: Diagnosis not present

## 2023-07-30 DIAGNOSIS — Z7901 Long term (current) use of anticoagulants: Secondary | ICD-10-CM | POA: Insufficient documentation

## 2023-07-30 DIAGNOSIS — R42 Dizziness and giddiness: Secondary | ICD-10-CM | POA: Insufficient documentation

## 2023-07-30 DIAGNOSIS — I499 Cardiac arrhythmia, unspecified: Secondary | ICD-10-CM | POA: Diagnosis not present

## 2023-07-30 DIAGNOSIS — I110 Hypertensive heart disease with heart failure: Secondary | ICD-10-CM | POA: Diagnosis not present

## 2023-07-30 DIAGNOSIS — I4819 Other persistent atrial fibrillation: Secondary | ICD-10-CM | POA: Insufficient documentation

## 2023-07-30 DIAGNOSIS — Z7984 Long term (current) use of oral hypoglycemic drugs: Secondary | ICD-10-CM | POA: Insufficient documentation

## 2023-07-30 DIAGNOSIS — E876 Hypokalemia: Secondary | ICD-10-CM | POA: Insufficient documentation

## 2023-07-30 DIAGNOSIS — R000 Tachycardia, unspecified: Secondary | ICD-10-CM | POA: Insufficient documentation

## 2023-07-30 DIAGNOSIS — D72829 Elevated white blood cell count, unspecified: Secondary | ICD-10-CM | POA: Insufficient documentation

## 2023-07-30 DIAGNOSIS — M542 Cervicalgia: Secondary | ICD-10-CM | POA: Insufficient documentation

## 2023-07-30 DIAGNOSIS — I771 Stricture of artery: Secondary | ICD-10-CM | POA: Diagnosis not present

## 2023-07-30 DIAGNOSIS — I5022 Chronic systolic (congestive) heart failure: Secondary | ICD-10-CM | POA: Diagnosis not present

## 2023-07-30 DIAGNOSIS — G4733 Obstructive sleep apnea (adult) (pediatric): Secondary | ICD-10-CM | POA: Diagnosis not present

## 2023-07-30 DIAGNOSIS — R059 Cough, unspecified: Secondary | ICD-10-CM | POA: Diagnosis not present

## 2023-07-30 DIAGNOSIS — R231 Pallor: Secondary | ICD-10-CM | POA: Diagnosis not present

## 2023-07-30 DIAGNOSIS — E785 Hyperlipidemia, unspecified: Secondary | ICD-10-CM | POA: Insufficient documentation

## 2023-07-30 DIAGNOSIS — R1110 Vomiting, unspecified: Secondary | ICD-10-CM | POA: Diagnosis not present

## 2023-07-30 LAB — CBC WITH DIFFERENTIAL/PLATELET
Abs Immature Granulocytes: 0.06 K/uL (ref 0.00–0.07)
Basophils Absolute: 0.1 K/uL (ref 0.0–0.1)
Basophils Relative: 1 %
Eosinophils Absolute: 0.4 K/uL (ref 0.0–0.5)
Eosinophils Relative: 3 %
HCT: 36.3 % (ref 36.0–46.0)
Hemoglobin: 11 g/dL — ABNORMAL LOW (ref 12.0–15.0)
Immature Granulocytes: 1 %
Lymphocytes Relative: 10 %
Lymphs Abs: 1.2 K/uL (ref 0.7–4.0)
MCH: 28.3 pg (ref 26.0–34.0)
MCHC: 30.3 g/dL (ref 30.0–36.0)
MCV: 93.3 fL (ref 80.0–100.0)
Monocytes Absolute: 1 K/uL (ref 0.1–1.0)
Monocytes Relative: 8 %
Neutro Abs: 9.5 K/uL — ABNORMAL HIGH (ref 1.7–7.7)
Neutrophils Relative %: 77 %
Platelets: 423 K/uL — ABNORMAL HIGH (ref 150–400)
RBC: 3.89 MIL/uL (ref 3.87–5.11)
RDW: 14.7 % (ref 11.5–15.5)
WBC: 12.1 K/uL — ABNORMAL HIGH (ref 4.0–10.5)
nRBC: 0 % (ref 0.0–0.2)

## 2023-07-30 LAB — BASIC METABOLIC PANEL WITH GFR
Anion gap: 14 (ref 5–15)
BUN: 8 mg/dL (ref 8–23)
CO2: 24 mmol/L (ref 22–32)
Calcium: 8.8 mg/dL — ABNORMAL LOW (ref 8.9–10.3)
Chloride: 99 mmol/L (ref 98–111)
Creatinine, Ser: 0.79 mg/dL (ref 0.44–1.00)
GFR, Estimated: >60 mL/min (ref >60)
Glucose, Bld: 127 mg/dL — ABNORMAL HIGH (ref 70–99)
Potassium: 3.2 mmol/L — ABNORMAL LOW (ref 3.5–5.1)
Sodium: 137 mmol/L (ref 135–145)

## 2023-07-30 LAB — TROPONIN I (HIGH SENSITIVITY): Troponin I (High Sensitivity): 46 ng/L — ABNORMAL HIGH (ref <18)

## 2023-07-30 LAB — BRAIN NATRIURETIC PEPTIDE: B Natriuretic Peptide: 556.9 pg/mL — ABNORMAL HIGH (ref 0.0–100.0)

## 2023-07-30 LAB — MAGNESIUM: Magnesium: 2.3 mg/dL (ref 1.7–2.4)

## 2023-07-30 MED ORDER — AMIODARONE HCL 200 MG PO TABS: ORAL_TABLET | ORAL | 0 refills | Status: DC

## 2023-07-30 MED ORDER — AMIODARONE HCL 200 MG PO TABS
200.0000 mg | ORAL_TABLET | Freq: Two times a day (BID) | ORAL | Status: DC
Start: 2023-07-30 — End: 2023-07-30
  Administered 2023-07-30: 200 mg via ORAL
  Filled 2023-07-30: qty 1

## 2023-07-30 MED ORDER — SODIUM CHLORIDE 0.9 % IV BOLUS
500.0000 mL | Freq: Once | INTRAVENOUS | Status: AC
  Administered 2023-07-30: 500 mL via INTRAVENOUS

## 2023-07-30 MED ORDER — APIXABAN 5 MG PO TABS
5.0000 mg | ORAL_TABLET | Freq: Two times a day (BID) | ORAL | Status: DC
Start: 2023-07-30 — End: 2023-07-30
  Administered 2023-07-30: 5 mg via ORAL
  Filled 2023-07-30: qty 1

## 2023-07-30 MED ORDER — POTASSIUM CHLORIDE CRYS ER 20 MEQ PO TBCR
40.0000 meq | EXTENDED_RELEASE_TABLET | Freq: Once | ORAL | Status: AC
  Administered 2023-07-30: 40 meq via ORAL
  Filled 2023-07-30: qty 2

## 2023-07-30 NOTE — Discharge Instructions (Signed)
 Stop Entresto  and begin taking amiodarone  as directed by your cardiology team.  You have converted back into normal sinus rhythm today but if you feel like you flipped back into A-fib through the weekend call the cardiology office on Monday for close follow-up.  Your labs today and chest x-ray were overall reassuring, you did have slightly low potassium and were given oral potassium replacement.
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
## 2023-07-30 NOTE — ED Triage Notes (Signed)
 PT brought in by [facility], PT has had complaints with AFIB PT has history of this and states no chest pain and felt palpations and felt clammy and dizzy. PT alert and talking, PT states within the last month they changed her meds to control her afib PT Hr was 121 upon arrival and has pacemaker and has no fired per PT.

## 2023-07-30 NOTE — ED Provider Notes (Signed)
 [employer] EMERGENCY DEPARTMENT AT [facility] Provider Note   CSN: [id number] Arrival date & time: 07/30/23  9390     Patient presents with: Atrial Fibrillation   Shannon [name] is a 73 y.o. female.   Shannon [name] is a 73 y.o. female, hypertension, hyperlipidemia, systolic heart failure, who presents to the emergency department for evaluation of palpitations and chest pain.  Patient reports that during the night she had a sudden onset of palpitations and felt like her heart was racing and beating funny.  When this happened she started to experience some pains in her neck and arm, and with this felt clammy and very lightheaded.  She also reports feeling quite weak and daughter had to help her when the symptoms occurred.  She reports this is what her A-fib has felt like previously.  She has had a lot of flares of her A-fib in the past month which she attributes to stress as her ex-husband died recently.  She does report that she is followed by general cardiology and EP, they have tried lots of medications to try and control her heart rhythm and she has also had 5 unsuccessful ablations, did not tolerate multiple antiarrhythmic meds.  Recently started on Entresto  and Jardiance  to try and strengthen her heart.  No other aggravating or alleviating factors.  The history is provided by the patient, medical records and a relative.  Atrial Fibrillation Associated symptoms include chest pain. Pertinent negatives include no shortness of breath.       Prior to Admission medications   Medication Sig Start Date End Date Taking? Authorizing Provider  acetaminophen  (TYLENOL ) 500 MG tablet Take 1,000 mg by mouth every 6 (six) hours as needed for moderate pain or headache.    [provider]  benzonatate  (TESSALON ) 100 MG capsule Take 1 capsule (100 mg total) by mouth 3 (three) times daily as needed for cough. 06/30/23   [name], PA-C  budesonide -formoterol   (SYMBICORT ) 80-4.5 MCG/ACT inhaler Inhale 2 puffs into the lungs 2 (two) times daily. Patient not taking: Reported on 07/22/2023 07/13/23   Knute [name], FNP  busPIRone  (BUSPAR ) 5 MG tablet Take 1 tablet (5 mg total) by mouth 2 (two) times daily. 06/25/23   [name], NP  Cholecalciferol (VITAMIN D ) 50 MCG (2000 UT) CAPS Take 2,000 Units by mouth in the morning.    [provider]  ELIQUIS  5 MG TABS tablet TAKE 1 TABLET BY MOUTH TWICE  DAILY 06/23/23   [name], MD  empagliflozin  (JARDIANCE ) 10 MG TABS tablet Take 1 tablet (10 mg total) by mouth daily before breakfast. Patient not taking: Reported on 07/22/2023 07/21/23   Darryle Thom CROME, PA-C  furosemide  (LASIX ) 20 MG tablet TAKE 1 TABLET BY MOUTH DAILY 03/05/23   [name], MD  Hydrocortisone (CORTIZONE-10 EX) Apply 1 application  topically as needed (skin irritation/itching).    [provider]  levalbuterol  (XOPENEX  HFA) 45 MCG/ACT inhaler Inhale 2 puffs into the lungs every 4 (four) hours as needed for wheezing. 06/16/23   [name], NP  loratadine (CLARITIN) 10 MG tablet Take 10 mg by mouth daily as needed for allergies.    [provider]  metoprolol  succinate (TOPROL -XL) 100 MG 24 hr tablet Take 1 tablet (100 mg total) by mouth 2 (two) times daily. Take with or immediately following a meal. 07/09/23   [name], [name], MD  Multiple Vitamin (MULTIVITAMIN) tablet Take 1 tablet by mouth at bedtime.  [provider]  potassium chloride  20 MEQ TBCR Take 1 tablet (20 mEq total) by mouth daily. 02/04/23   Riddle, [name], NP  sacubitril -valsartan  (ENTRESTO ) 24-26 MG Take 1 tablet by mouth 2 (two) times daily. Patient not taking: Reported on 07/22/2023 07/21/23   Darryle Thom CROME, PA-C  simvastatin  (ZOCOR ) 20 MG tablet TAKE 1 TABLET BY MOUTH DAILY 05/14/23   [name], NP    Allergies: Definity  [perflutren  lipid microsphere], Ivp dye [iodinated contrast media], Sulfur, Ace inhibitors,  Amiodarone , Hctz [hydrochlorothiazide ], Oxycodone, Prednisone , Sulfa antibiotics, and Voltaren [diclofenac sodium]    Review of Systems  Constitutional:  Positive for diaphoresis and fatigue. Negative for chills and fever.  HENT: Negative.    Respiratory:  Negative for cough and shortness of breath.   Cardiovascular:  Positive for chest pain and palpitations.  Neurological:  Positive for dizziness and light-headedness.    Updated Vital Signs BP 110/67 (BP Location: Right Arm)   Pulse 120   Temp 97.9 F (36.6 C) (Oral)   Resp 23   Ht 5' 7 (1.702 m)   Wt 83.9 kg   SpO2 100%   BMI 28.98 kg/m   Physical Exam Vitals and nursing note reviewed.  Constitutional:      General: She is not in acute distress.    Appearance: Normal appearance. She is well-developed. She is not ill-appearing or diaphoretic.  HENT:     Head: Normocephalic and atraumatic.  Eyes:     General:        Right eye: No discharge.        Left eye: No discharge.  Cardiovascular:     Rate and Rhythm: Tachycardia present. Rhythm irregular.     Pulses: Normal pulses.  Pulmonary:     Effort: Pulmonary effort is normal. No respiratory distress.     Breath sounds: Normal breath sounds. No wheezing or rales.     Comments: Respirations equal and unlabored, patient able to speak in full sentences, lungs clear to auscultation bilaterally  Abdominal:     General: Bowel sounds are normal. There is no distension.     Palpations: Abdomen is soft. There is no mass.     Tenderness: There is no abdominal tenderness. There is no guarding.     Comments: Abdomen soft, nondistended, nontender to palpation in all quadrants without guarding or peritoneal signs  Musculoskeletal:        General: No deformity.     Cervical back: Neck supple.  Skin:    General: Skin is warm and dry.     Capillary Refill: Capillary refill takes less than 2 seconds.  Neurological:     Mental Status: She is alert and oriented to person, place, and  time.     Coordination: Coordination normal.     Comments: Speech is clear, able to follow commands Moves extremities without ataxia, coordination intact  Psychiatric:        Mood and Affect: Mood normal.        Behavior: Behavior normal.     (all labs ordered are listed, but only abnormal results are displayed) Labs Reviewed  BASIC METABOLIC PANEL WITH GFR - Abnormal; Notable for the following components:      Result Value   Potassium 3.2 (*)    Glucose, Bld 127 (*)    Calcium  8.8 (*)    All other components within normal limits  CBC WITH DIFFERENTIAL/PLATELET - Abnormal; Notable for the following components:   WBC 12.1 (*)    Hemoglobin  11.0 (*)    Platelets 423 (*)    Neutro Abs 9.5 (*)    All other components within normal limits  BRAIN NATRIURETIC PEPTIDE - Abnormal; Notable for the following components:   B Natriuretic Peptide 556.9 (*)    All other components within normal limits  TROPONIN I (HIGH SENSITIVITY) - Abnormal; Notable for the following components:   Troponin I (High Sensitivity) 46 (*)    All other components within normal limits  MAGNESIUM     EKG: EKG Interpretation Date/Time:  Thursday July 30 2023 11:08:17 EDT Ventricular Rate:  69 PR Interval:  142 QRS Duration:  87 QT Interval:  426 QTC Calculation: 457 R Axis:   47  Text Interpretation: Sinus rhythm Anteroseptal infarct, age indeterminate Lateral leads are also involved now in sinus Confirmed by Dean Clarity [phone]) on 07/31/2023 3:07:43 PM  Radiology: DG Chest Portable 1 View Result Date: 07/30/2023 CLINICAL DATA:  cough, cp, Afib EXAM: PORTABLE CHEST - 1 VIEW COMPARISON:  July 18, 2023 FINDINGS: Left chest pacemaker with leads terminating in the right atrium and right ventricle. No focal airspace consolidation, pleural effusion, or pneumothorax. Mild cardiomegaly. Tortuous aorta with aortic atherosclerosis. No acute fracture or destructive lesions. Multilevel thoracic osteophytosis.  IMPRESSION: No acute cardiopulmonary abnormality. Electronically Signed   By: Rogelia Myers M.D.   On: 07/30/2023 08:13       Procedures   Medications Ordered in the ED  potassium chloride  SA (KLOR-CON  M) CR tablet 40 mEq (40 mEq Oral Given 07/30/23 0834)  sodium chloride  0.9 % bolus 500 mL (0 mLs Intravenous Stopped 07/30/23 1146)                                    Medical Decision Making Amount and/or Complexity of Data Reviewed Labs: ordered. Radiology: ordered.  Risk Prescription drug management.   Presents with palpitations, has complex history of A-fib that has been difficult to control.  Found to be in A-fib RVR on arrival with heart rate ranging in the 120s-130s.  Some soft blood pressures in the 90s to low 100s systolic.  Anticoagulated on Eliquis , did not take dose this morning.  Has had multiple unsuccessful cardioversions previously.  EKG with A-fib RVR without acute ischemic changes  Mild hypokalemia with potassium of 3.2, p.o. potassium given, normal magnesium , no other significant electrolyte derangements, minimal leukocytosis, stable hemoglobin, troponins mildly elevated but at patient's baseline BNP mildly elevated 556.  Chest x-ray without evidence of pulmonary edema or other acute cardiopulmonary disease.  Given complexity of patient's A-fib that has been challenging to control will discuss with cardiology.  Case discussed with PA Aline Door with cardiology service, EP team will see patient.  Spoke with PA Prentice Passey after he and Dr. Cindie evaluated the patient, plan is to start patient on oral amiodarone  and have her go home with close follow-up follow-up in office.  They are okay with her heart rates being mildly elevated here.  When I went back in to reassess the patient she had spontaneously converted and is now feeling much better and is back in normal sinus rhythm.  She is in agreement with cardiology's plan to start on amiodarone  with close  follow-up and she will call the office if she has any issues with this medication over the next few days.  At this time there does not appear to be any evidence of an acute emergency medical condition requiring  further emergent evaluation and the patient appears stable for discharge with appropriate outpatient follow up. Diagnosis and return precautions discussed with patient who verbalizes understanding and is agreeable to discharge.        Final diagnoses:  Atrial fibrillation with RVR (HCC)  Hypokalemia    ED Discharge Orders          Ordered    amiodarone  (PACERONE ) 200 MG tablet  Multiple Frequencies       Note to Pharmacy: Previous intolerance noted; Clinician discussed and pt willing to re-challenge.   07/30/23 1026               Alva Larraine FALCON, PA-C 08/04/23 1825    Emil Share, DO 08/05/23 1416

## 2023-07-31 ENCOUNTER — Ambulatory Visit: Admitting: Behavioral Health

## 2023-07-31 DIAGNOSIS — F4323 Adjustment disorder with mixed anxiety and depressed mood: Secondary | ICD-10-CM

## 2023-07-31 NOTE — Progress Notes (Signed)
 [facility] Counselor/Therapist Progress Note  Patient ID: Shannon [name], MRN: [id number],    Date:,  July 31, 2023  Time Spent: 58 minutes, 3:00pm until 3:58 PM this session was held via video teletherapy. The patient consented to the video teletherapy and was located in her home during this session. She is aware it is the responsibility of the patient to secure confidentiality on her end of the session. The provider was in a private home office for the duration of this session.     Reported Symptoms: Anxiety, depression  Mental Status Exam: Appearance:  Well Groomed     Behavior: Appropriate  Motor: Normal  Speech/Language:  Clear and Coherent  Affect: Appropriate for  Mood: normal  Thought process: normal  Thought content:   WNL  Sensory/Perceptual disturbances:   WNL  Orientation: oriented to person, place, time/date, situation, day of week, month of year, and year  Attention: Good  Concentration: Good  Memory: WNL  Fund of knowledge:  Good  Insight:   Good  Judgment:  Good  Impulse Control: Good   Risk Assessment: Danger to Self:  No Self-injurious Behavior: No Danger to Others: No Duty to Warn:no Physical Aggression / Violence:No  Access to Firearms a concern: No  Gang Involvement:No   Subjective: The patient's husband passed away this weekend.  She presented with mixed feelings as he was living alone and she suspects that he was not taking good care of himself and his death may in part have been related to not treating his diabetes the way that he should have.  Although they were separated and the relationship was not good she still was married to him for a long time.  She also expressed an as a sense of relief.  She and her daughter and son are talking about the best way to handle arrangements not knowing what she can handle emotionally but also she has had A-fib since finding out about his death.  She has met with her cardiologist also seeing him  while in the hospital and they changed some medication and she is hopeful that helps with the A-fib.  Her daughter asked to speak with me briefly and indicated that she knows that her mom has had to handle a lot of things for a long time but is concerned about how much her mom is trying to handle now.  She acknowledges that she had always looked in the world of anticipatory fear and anxiety of what her husband was going to try to do next to try to deceive her.  She recognizes that a lot of cases he was feeding his addiction and even though it felt very personal it was not always that way but it always had a negative impact on she and the kids especially she felt like she raised the 2 of them a lot of times by herself.  We talked about this being a starting point for her beginning to live like the way that she wanted to addressing the medical but also with what she wants to do in life in addition to spending time with her children and grandchildren. She does contract for safety having no thoughts of hurting herself or anyone else. Interventions: Cognitive Behavioral Therapy  Diagnosis: Adjustment disorder with mixed anxiety and depressed mood.  Plan: I will meet with the patient every 2 weeks via video session TX. Plan:To use cognitive behavioral therapy principles as well as elements of dialectical behavior therapy.  Goals are to reduce  anxiety and depression by at least 50% with a target date of November 06, 2022.  Goals are to have less sadness as indicated by PHQ-9 scores as well as patient report.  We also work on improving mood and return to a healthier level of functioning as defined by her goals for being happy, identify causes and process triggers for depressed mood.  We will use cognitive behavioral therapy to explore and replace unhealthy thoughts and behavior patterns contributing to depression.  I will continue to encourage shearing of feelings related to the causes and symptoms of depression as  well as teach and encouraged use of coping skills for management of depressive symptoms.  We also will work to improve the patient's ability to manage anxiety symptoms and better handle stress, identify causes for anxiety and explore ways for reduction of anxiety in addition to resolving conflicts contributing to anxiety and managing thoughts and worrisome thinking is contributing to anxiety.  Interventions will include providing education about anxiety, facilitate problem solution skills, teaching coping skills for managing anxiety such as grounding exercises, progressive muscle relaxation and cognitive re framing etc.  We will also use cognitive behavioral therapy to identify and change anxiety provoking thoughts and behavior patterns as well as using DBT distress tolerance and mindfulness skills. I reviewed the treatment plan goals with the patient who agreed to continue with goals as stated above. Progress: 35%New target date will be October 31st, 2025. Lorrene CHRISTELLA Hasten, [facility]                                                Lorrene CHRISTELLA Hasten, [facility]               Lorrene CHRISTELLA Hasten, [facility]               Lorrene CHRISTELLA Hasten, [facility]               Lorrene CHRISTELLA Hasten, [facility]               Lorrene CHRISTELLA Hasten, [facility]               Lorrene CHRISTELLA Hasten, [facility]               Lorrene CHRISTELLA Hasten, [facility]               Lorrene CHRISTELLA Hasten, [facility]               Lorrene CHRISTELLA Hasten, [facility]               Lorrene CHRISTELLA Hasten, [facility]               Lorrene CHRISTELLA Hasten, [facility]

## 2023-08-01 ENCOUNTER — Telehealth: Payer: Self-pay | Admitting: Physician Assistant

## 2023-08-01 NOTE — Telephone Encounter (Signed)
   The patient called the answering service after-hours today. Recently seen in the ED 07/30/23 with recurrent AF RVR, known issue. EP team discussed re-trial of amiodarone  versus ablation. Patient wished to pursue amiodarone . She also has hx of chronic HFrEF and has been dealing with multiple stressors - husband passed away recently, also has been dealing with viral infection the last few weeks. She reports she is not tolerating the amiodarone  well - each time she has taken it, she has developed increasing SOB/tightness in chest for which she has to use her inhaler and then it tapers off, but then recurs with the next amiodarone  dose. I shared with her that I am concerned that the symptoms of chest tightness/SOB may actually be sequalae of another process such as worsening HF/fluid overload or superimposed bacterial infection/PNA from her recent viral URI, and advised she proceed to the ER for evaluation. She states she knows her body well and truly feels it is the medication and wishes to stop it until she can review next steps with Dr. Cindie on Monday (looks like other option mentioned was ablation, so no clear medication to switch to at this time). Per her request will send this msg to him for his review and advisement, but reminded her she is welcome to change her mind and come to ER for re-eval. She verbalized understanding and gratitude.  Willistine Ferrall N Lillieann Pavlich, PA-C

## 2023-08-02 ENCOUNTER — Encounter: Payer: Self-pay | Admitting: Cardiology

## 2023-08-03 MED ORDER — METOPROLOL SUCCINATE ER 100 MG PO TB24: 100.0000 mg | ORAL_TABLET | Freq: Two times a day (BID) | ORAL | 3 refills | Status: DC

## 2023-08-05 ENCOUNTER — Telehealth: Payer: Self-pay

## 2023-08-05 DIAGNOSIS — F32A Depression, unspecified: Secondary | ICD-10-CM

## 2023-08-06 ENCOUNTER — Telehealth: Payer: Self-pay

## 2023-08-06 ENCOUNTER — Encounter: Payer: Self-pay | Admitting: Cardiology

## 2023-08-06 NOTE — Progress Notes (Signed)
 Complex Care Management Note  Care Guide Note 08/06/2023 Name: Leone Mobley MRN: [id number] DOB: 1950/12/13  Sally Menard is a 73 y.o. year old female who sees [name], [name], FNP for primary care. I reached out to Karna Henry Blush by phone today to offer complex care management services.  Ms. Rabold was given information about Complex Care Management services today including:   The Complex Care Management services include support from the care team which includes your Nurse Care Manager, Clinical Social Worker, or Pharmacist.  The Complex Care Management team is here to help remove barriers to the health concerns and goals most important to you. Complex Care Management services are voluntary, and the patient may decline or stop services at any time by request to their care team member.   Complex Care Management Consent Status: Patient did not agree to participate in complex care management services at this time.  Follow up plan:  Patient will follow up with PCP.  Encounter Outcome:  Patient Refused  Dreama Agent [facility], [facility] Health Care Management Assistant Direct Dial: [phone]  Fax: [phone]

## 2023-08-07 MED ORDER — SPIRONOLACTONE 25 MG PO TABS: 12.5000 mg | ORAL_TABLET | Freq: Every day | ORAL | 3 refills | Status: DC

## 2023-08-07 NOTE — Addendum Note (Signed)
 Addended by: CHAUVIGNE, Tramaine Snell on: 08/07/2023 04:04 PM   Modules accepted: Orders

## 2023-08-12 NOTE — Progress Notes (Unsigned)
 Cardiology Office Note Date:  08/12/2023  Patient ID:  Shannon [name], Shannon [name] 09-19-1950, MRN [id number] PCP:  Knute [name], FNP  Electrophysiologist: Drs. Waddell and  Allred   Chief Complaint:    6 mo  History of Present Illness: Shannon [name] is a 73 y.o. 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 Almetta noted  [name] into AF today (6/30)  based on device interrogation, other episodes on 06/23 for 13 hours, ~4 hours  coarse Afib (not true AFlutter) with no change in rhythm post DCCV Admitted for further management HRs initially improved though LVEF came back with EF 30's and dilt stopped > HRs back up Home coreg  18.75mg  BID >> lopressor  50mg  BID EP brought on board >> recommended to re-try DCCV with increased Burman Lopressor  >> Toprol  DCCV > brief AFlutter > SR Discharged 07/09/23  Saw Dr. Cindie 07/22/23, residual cough productive of phlegm. She is slowly improving but still feels weak. She has been in normal rhythm for several days now but did have nearly 2 weeks straight of arrhythmia in the setting of the illness  Did not think she was a repeat ablation candidate Retry AAD (amio/tikousn, previously failed) AVN ablation Planned to allow her to recover from her viral infection. She is going to also start her Entresto  and Jardiance . Hopefully this helps cooled off some of her atrial fibrillation. If she continues to experience highly symptomatic episodes of atrial fibrillation, she would pursue AV junction ablation   ER 07/30/23, had been  doing OK, though her husband passed away that week and provoked recurrent AFib, discussed AVN ablation vs retry amiodarone  200mg  PO BID x 10 days followed by 200mg  PO daily.  If fails amiodarone , favor AV node ablation.  Subsequently called very symptomatic with amiodarone , reported severe CP, SOB > stopped  TODAY  *** symptoms with amio? *** AVNode ablation? *** eliquis , dose, bleeding, labs *** burden, rates *** try low dose amio?    Device iformation: MDT [name], implanted 03/07/2019: Afib surveillance >> removed Developed tachy/brady with symptomatic AFib and post conversion pauses MDT dual PPM implanted 03/09/23 (Dr. Kennyth)  AFib hx Diagnosed 2017 PVI ablation 03/25/2016 PVI ablation 01/31/20 STROKE on compliant and appropriately dosed Xarelto  02/2021 > Eliquis  PVI ablation 12/13/21  AAD Hx Flecainide  2017 >> stopped May 2018 2/2 c/o fatigue  >> resumed Jan 2020 with recurrent AF episodes >> stopped April 2022, stopped daily use ablation >> PRN >> off Tikosyn  started Dec 2023 >> stopped during load 2/2 QT prolongation Amiodarone  started Dec 2023 > intolerant w/ blurred vision, fatigue, SOB, mood swings..... Re-tried amiodarone  July 2025 >> again poorly tolerated and quickly stopped  Past Medical History:  Diagnosis Date   Acute systolic heart failure (HCC) 07/08/2023   Allergy    Anemia    Anxiety    Arthritis    neck and lower back (03/25/2016)   Asthma 1990s X 1   short term inhaler use    CAD (coronary artery disease)    Cardiac pacemaker in situ    MDT   CHF (congestive heart  failure) (HCC)    Chronic lower back pain    COVID-19 03/06/2022   Degenerative disorder of bone    Depression    Diastolic dysfunction    Drug-induced lupus erythematosus    HCTZ induced; still gettin over it (03/25/2016)   Foot swelling 05/19/2022   GERD (gastroesophageal reflux disease)    Headache, unspecified headache type 03/06/2022   Herniated disc, cervical    Hospital  discharge follow-up 03/14/2022   Hyperlipidemia    Hypertension    Neuromuscular disorder (HCC)    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 HTN   Obesity (BMI 30-39.9) 11/07/2022   Orthostatic hypotension    OSA on CPAP    Osteopenia    Osteoporosis 2012   PAF (paroxysmal atrial fibrillation) (HCC)    Paroxysmal atrial fibrillation (HCC) 12/13/2021   Phlebitis after infusion 03/18/2023   Pinched nerve in neck    PND (post-nasal drip) 12/18/2022   Rapid atrial fibrillation (HCC) 07/06/2023   Sinus node dysfunction (HCC)    Sinus pressure 12/18/2022   Sleep apnea    wears CPAP   Stroke (HCC)    T12 compression fracture (HCC) 11/2015   Vitamin D  deficiency    Whiplash injury 06/07/2010    Past Surgical History:  Procedure Laterality Date   APPENDECTOMY  1990s   ATRIAL FIBRILLATION ABLATION N/A 03/25/2016   Procedure: Atrial Fibrillation Ablation;  Surgeon: Lynwood Rakers, MD;  Location: [facility] INVASIVE CV LAB;  Service: Cardiovascular;  Laterality: N/A;   ATRIAL FIBRILLATION ABLATION N/A 01/31/2020   Procedure: ATRIAL FIBRILLATION ABLATION;  Surgeon: Rakers Lynwood, MD;  Location: [facility] INVASIVE CV LAB;  Service: Cardiovascular;  Laterality: N/A;   ATRIAL FIBRILLATION ABLATION N/A 12/13/2021   Procedure: ATRIAL FIBRILLATION ABLATION;  Surgeon: [name], MD;  Location: [facility] INVASIVE CV LAB;  Service: Cardiovascular;  Laterality: N/A;   CARDIOVERSION N/A 07/09/2023   Procedure: CARDIOVERSION;  Surgeon: Okey Vina GAILS, MD;  Location[facility] INVASIVE CV LAB;  Service: Cardiovascular;  Laterality: N/A;   COLONOSCOPY     FOREARM FRACTURE SURGERY Left ~ 02/2011   broke arm; shattered wrist   FOREARM HARDWARE REMOVAL Left ~ 07/2011   implantable loop recorder placement  03/07/2019   Medtronic Reveal Linq model LNQ 22 implantable loop recorder ([id number]) implanted by Dr Rakers for Afib management   INSERT / REPLACE / REMOVE PACEMAKER  2025   LOOP RECORDER REMOVAL N/A 03/09/2023    Procedure: LOOP RECORDER REMOVAL;  Surgeon: Kennyth Chew, MD;  Location: [facility] INVASIVE CV LAB;  Service: Cardiovascular;  Laterality: N/A;   PACEMAKER IMPLANT N/A 03/09/2023   Procedure: PACEMAKER IMPLANT;  Surgeon: Kennyth Chew, MD;  Location: [facility] INVASIVE CV LAB;  Service: Cardiovascular;  Laterality: N/A;   Spinal Nerve Ablation     TEE WITHOUT CARDIOVERSION N/A 03/24/2016   Procedure: TRANSESOPHAGEAL ECHOCARDIOGRAM (TEE);  Surgeon: Jerel Balding, MD;  Location: [facility] ENDOSCOPY;  Service: Cardiovascular;  Laterality: N/A;    Current Outpatient Medications  Medication Sig Dispense Refill   acetaminophen  (TYLENOL ) 500 MG tablet Take 1,000 mg by mouth every 6 (six) hours as needed for moderate pain or headache.     amiodarone  (PACERONE ) 200 MG tablet Take 1 tablet (200 mg total) by mouth 2 (two) times daily for 10 days, THEN 1 tablet (200 mg total) daily. 65 tablet 0   benzonatate  (TESSALON ) 100 MG capsule Take 1 capsule (100 mg total) by mouth 3 (three) times daily as needed for cough. 30 capsule 0  budesonide -formoterol  (SYMBICORT ) 80-4.5 MCG/ACT inhaler Inhale 2 puffs into the lungs 2 (two) times daily. (Patient not taking: Reported on 07/22/2023) 1 each 3   busPIRone  (BUSPAR ) 5 MG tablet Take 1 tablet (5 mg total) by mouth 2 (two) times daily. 200 tablet 0   Cholecalciferol (VITAMIN D ) 50 MCG (2000 UT) CAPS Take 2,000 Units by mouth in the morning.     ELIQUIS  5 MG TABS tablet TAKE 1 TABLET BY MOUTH TWICE  DAILY 200 tablet 2   empagliflozin  (JARDIANCE ) 10 MG TABS tablet Take 1 tablet (10 mg total) by mouth daily before breakfast. (Patient not taking: Reported on 07/22/2023) 90 tablet 3   Hydrocortisone (CORTIZONE-10 EX) Apply 1 application  topically as needed (skin irritation/itching).     levalbuterol  (XOPENEX  HFA) 45 MCG/ACT inhaler Inhale 2 puffs into the lungs every 4 (four) hours as needed for wheezing. 1 each 2   loratadine (CLARITIN) 10 MG tablet Take 10 mg by mouth daily as needed  for allergies.     metoprolol  succinate (TOPROL -XL) 100 MG 24 hr tablet Take 1 tablet (100 mg total) by mouth 2 (two) times daily. Take with or immediately following a meal. 180 tablet 3   Multiple Vitamin (MULTIVITAMIN) tablet Take 1 tablet by mouth at bedtime.     simvastatin  (ZOCOR ) 20 MG tablet TAKE 1 TABLET BY MOUTH DAILY 100 tablet 2   spironolactone  (ALDACTONE ) 25 MG tablet Take 0.5 tablets (12.5 mg total) by mouth daily. 45 tablet 3   No current facility-administered medications for this visit.    Allergies:   Definity  [perflutren  lipid microsphere], Ivp dye [iodinated contrast media], Sulfur, Ace inhibitors, Amiodarone , Hctz [hydrochlorothiazide ], Oxycodone, Prednisone , Sulfa antibiotics, and Voltaren [diclofenac sodium]   Social History:  The patient  reports that she quit smoking about 9 years ago. Her smoking use included cigarettes and e-cigarettes. She started smoking about 48 years ago. She has a 22 pack-year smoking history. She has never used smokeless tobacco. She reports that she does not currently use alcohol. She reports that she does not use drugs.   Family History:  The patient's family history includes ADD / ADHD in her daughter and son; Alcohol abuse in her daughter; Bipolar disorder in her daughter; Birth defects in her son; Cancer in her mother; Depression in her daughter; Diabetes in her paternal grandfather, paternal grandmother, and son; Early death in her mother; Hearing loss in her paternal grandfather and son; Heart disease in her brother, father, maternal grandfather, paternal grandmother, and sister; Hypertension in her brother, daughter, father, maternal grandmother, and sister; Learning disabilities in her daughter and son; Lung cancer in her mother; Miscarriages / Stillbirths in her daughter and mother; Obesity in her daughter, sister, and son; Other in her daughter and son; Stroke in her father and maternal grandfather; Vision loss in her daughter, maternal  grandfather, mother, paternal grandfather, sister, and son.  ROS:  Please see the history of present illness.    All other systems are reviewed and otherwise negative.   PHYSICAL EXAM:  VS:  There were no vitals taken for this visit. BMI: There is no height or weight on file to calculate BMI. Well nourished, well developed, in no acute distress HEENT: normocephalic, atraumatic Neck: no JVD, carotid bruits or masses Cardiac:  RRR; some extrasystoles, no significant murmurs, no rubs, or gallops Lungs: CTA b/l, no wheezing, rhonchi or rales Abd: soft, nontender MS: no deformity or atrophy Ext:  no edema Skin: warm and dry, no rash Neuro:  No  gross deficits appreciated Psych: euthymic mood, full affect    EKG:  done today and reviewed by myself  SR 66bpm  Device interrogation  ***  07/07/23: TTE  1. Left ventricular ejection fraction, by estimation, is 30 to 35%. The  left ventricle has moderately decreased function. The left ventricle  demonstrates global hypokinesis. There is mild concentric left ventricular  hypertrophy. Left ventricular  diastolic function could not be evaluated.   2. Right ventricular systolic function is normal. The right ventricular  size is normal. Tricuspid regurgitation signal is inadequate for assessing  PA pressure.   3. Left atrial size was moderately dilated.   4. Right atrial size was moderately dilated.   5. The mitral valve is normal in structure. No evidence of mitral valve  regurgitation. No evidence of mitral stenosis.   6. The aortic valve is tricuspid. Aortic valve regurgitation is not  visualized. No aortic stenosis is present.   7. The inferior vena cava is normal in size with greater than 50%  respiratory variability, suggesting right atrial pressure of 3 mmHg.   Comparison(s): Prior images reviewed side by side. The left ventricular  function is significantly worse. This may be due to uncontrolled  tachyarrhythmia.   PET/CT Result  date: 06/17/23   LV perfusion is normal. There is no evidence of ischemia. There is no evidence of infarction.   Rest left ventricular function is abnormal. Rest global function is moderately reduced. There were no regional wall motion abnormalities. Rest EF: 38%. Stress left ventricular function is abnormal. Stress global function is mildly reduced. There were no regional wall abnormalities. Stress EF: 45%. End diastolic cavity size is moderately enlarged. End systolic cavity size is moderately enlarged.   Myocardial blood flow was computed to be 0.22ml/g/min at rest and 1.42ml/g/min at stress. Global myocardial blood flow reserve was 1.77 and was mildly abnormal.   Coronary calcium  was present on the attenuation correction CT images. Moderate coronary calcifications were present. Coronary calcifications were present in the left anterior descending artery and left circumflex artery distribution(s).   Findings are consistent with no ischemia and no infarction. The study is low risk.   Abnormal resting/stress EF suggest echo correlation. Normal perfusion but abnormal MBFR may represent microvascular dx or somewhat high resting flow      10/23/22: TTE 1. Left ventricular ejection fraction, by estimation, is 60 to 65%. The  left ventricle has normal function. The left ventricle has no regional  wall motion abnormalities. There is mild concentric left ventricular  hypertrophy. Left ventricular diastolic  parameters are indeterminate.   2. Right ventricular systolic function is normal. The right ventricular  size is normal.   3. Left atrial size was moderately dilated.   4. Right atrial size was moderately dilated.   5. The mitral valve is normal in structure. Mild to moderate mitral valve  regurgitation. No evidence of mitral stenosis.   6. The aortic valve is tricuspid. There is mild calcification of the  aortic valve. Aortic valve regurgitation is not visualized. Aortic valve   sclerosis/calcification is present, without any evidence of aortic  stenosis.   7. The inferior vena cava is normal in size with greater than 50%  respiratory variability, suggesting right atrial pressure of 3 mmHg.  02/07/21: TTE IMPRESSIONS   1. Left ventricular ejection fraction, by estimation, is 55 to 60%. The  left ventricle has normal function. The left ventricle has no regional  wall motion abnormalities. The left ventricular internal cavity size was  mildly dilated. Left ventricular  diastolic parameters were normal.   2. Right ventricular systolic function is normal. The right ventricular  size is normal. There is normal pulmonary artery systolic pressure.   3. The mitral valve is normal in structure. Mild mitral valve  regurgitation.   4. The aortic valve is normal in structure. Aortic valve regurgitation is  not visualized.   5. The inferior vena cava is normal in size with greater than 50%  respiratory variability, suggesting right atrial pressure of 3 mmHg.    01/31/20: EPS/Ablation CONCLUSIONS: 1. Sinus rhythm upon presentation.   2. Intracardiac echo reveals a moderate sized left atrium with four separate pulmonary veins without evidence of pulmonary vein stenosis. 3. Return of conduction along the right superior and inferior pulmonary veins from the prior ablation.  There was trivial conduction along the left superior pulmonary vein.  The left inferior pulmonary vein was quiescent from the prior ablation. 4. Successful sequential electrical re isolation of the right superior, right inferior, and left superior pulmonary veins  5. Additional left atrial ablation was performed with a standard box lesion created along the posterior wall of the left atrium.  The posterior wall was successfully isolated today 6. No early apparent complications.   03/11/2019: lexiscan  stress test Nuclear stress EF: 48%. There was no ST segment deviation noted during stress. The study is  normal. This is a low risk study. The left ventricular ejection fraction is mildly decreased (45-54%).   Normal pharmacologic nuclear stress test with no evidence for prior infarct or ischemia. Mildly decreased LVEF 50%.    03/11/2019; TTE IMPRESSIONS   1. Left ventricular ejection fraction, by estimation, is 60 to 65%. The  left ventricle has normal function. The left ventricle has no regional  wall motion abnormalities. The left ventricular internal cavity size was  mildly dilated. There is mild left  ventricular hypertrophy. Left ventricular diastolic parameters are  indeterminate.   2. Right ventricular systolic function is normal. The right ventricular  size is normal.   3. Left atrial size was moderately dilated.   4. Right atrial size was mildly dilated.   5. Mild mitral valve regurgitation.   6. Aortic valve regurgitation is not visualized.   7. The inferior vena cava is normal in size with greater than 50%  respiratory variability, suggesting right atrial pressure of 3 mmHg.    03/25/2016: EPS/Ablation This study demonstrated sinus rhythm upon presentation; intracardiac echo reveals a moderately enlarged left atrium with four separate pulmonary veins without evidence of pulmonary vein stenosis; successful electrical isolation and anatomical encircling of all four pulmonary veins with radiofrequency current; cavo-tricuspid isthmus ablation was performed with complete bidirectional isthmus block achieved; additional ablation performed at the SVC/RA junction; no inducible arrhythmias following ablation both on and off of Isuprel ; no early apparent complications..    Recent Labs: 03/07/2023: TSH 1.156 07/01/2023: Pro Brain Natriuretic Peptide 3,772.0 07/18/2023: ALT 40 07/30/2023: B Natriuretic Peptide 556.9; BUN 8; Creatinine, Ser 0.79; Hemoglobin 11.0; Magnesium  2.3; Platelets 423; Potassium 3.2; Sodium 137  10/15/2022: Chol/HDL Ratio 2.3; Cholesterol, Total 139; HDL 60; LDL Chol Calc  (NIH) 62; Triglycerides 93   Estimated Creatinine Clearance: 70.7 mL/min (by C-G formula based on SCr of 0.79 mg/dL).   Wt Readings from Last 3 Encounters:  07/30/23 185 lb (83.9 kg)  07/22/23 192 lb (87.1 kg)  07/21/23 190 lb (86.2 kg)     Other studies reviewed: Additional studies/records reviewed today include: summarized above  ASSESSMENT AND PLAN:  1. Paroxysmal AFib     CHA2DS2Vasc is 5,  on Eliquis , *** appropriately dosed      *** % burden      2. HTN     *** Looks good  3. H/o diastolic HF     *** Last echo with preserved LVEF, normal diastolic function     *** No symptoms or exam findings of volume OL   4. Secondary hypercoagulable state          Disposition: ***, sooner if needed   Current medicines are reviewed at length with the patient today.  The patient did not have any concerns regarding medicines.  Bonney Charlies Arthur, PA-C 08/12/2023 7:55 AM     [facility] [address] [place] [address] [phone] (office)  [phone] (fax)

## 2023-08-13 ENCOUNTER — Other Ambulatory Visit: Payer: Self-pay

## 2023-08-13 ENCOUNTER — Ambulatory Visit: Attending: Physician Assistant | Admitting: Physician Assistant

## 2023-08-13 ENCOUNTER — Encounter: Payer: Self-pay | Admitting: Physician Assistant

## 2023-08-13 ENCOUNTER — Ambulatory Visit

## 2023-08-13 VITALS — BP 118/70 | HR 79 | Ht 67.0 in | Wt 188.4 lb

## 2023-08-13 VITALS — BP 146/80 | HR 82 | Ht 67.0 in | Wt 186.0 lb

## 2023-08-13 DIAGNOSIS — D6869 Other thrombophilia: Secondary | ICD-10-CM

## 2023-08-13 DIAGNOSIS — I5022 Chronic systolic (congestive) heart failure: Secondary | ICD-10-CM | POA: Diagnosis not present

## 2023-08-13 DIAGNOSIS — Z950 Presence of cardiac pacemaker: Secondary | ICD-10-CM | POA: Diagnosis not present

## 2023-08-13 DIAGNOSIS — I4819 Other persistent atrial fibrillation: Secondary | ICD-10-CM

## 2023-08-13 DIAGNOSIS — Z0000 Encounter for general adult medical examination without abnormal findings: Secondary | ICD-10-CM | POA: Diagnosis not present

## 2023-08-13 DIAGNOSIS — I480 Paroxysmal atrial fibrillation: Secondary | ICD-10-CM

## 2023-08-13 DIAGNOSIS — I10 Essential (primary) hypertension: Secondary | ICD-10-CM

## 2023-08-13 DIAGNOSIS — I428 Other cardiomyopathies: Secondary | ICD-10-CM

## 2023-08-13 LAB — CUP PACEART INCLINIC DEVICE CHECK
Battery Remaining Longevity: 160 mo
Battery Voltage: 3.19 V
Brady Statistic AP VP Percent: 0.06 %
Brady Statistic AP VS Percent: 75.34 %
Brady Statistic AS VP Percent: 0.01 %
Brady Statistic AS VS Percent: 24.59 %
Brady Statistic RA Percent Paced: 75.1 %
Brady Statistic RV Percent Paced: 0.07 %
Date Time Interrogation Session: 20250807174431
Implantable Lead Connection Status: 753985
Implantable Lead Connection Status: 753985
Implantable Lead Implant Date: 20250303
Implantable Lead Implant Date: 20250303
Implantable Lead Location: 753859
Implantable Lead Location: 753860
Implantable Lead Model: 3830
Implantable Lead Model: 5076
Implantable Pulse Generator Implant Date: 20250303
Lead Channel Impedance Value: 304 Ohm
Lead Channel Impedance Value: 361 Ohm
Lead Channel Impedance Value: 418 Ohm
Lead Channel Impedance Value: 513 Ohm
Lead Channel Pacing Threshold Amplitude: 0.5 V
Lead Channel Pacing Threshold Amplitude: 1 V
Lead Channel Pacing Threshold Pulse Width: 0.4 ms
Lead Channel Pacing Threshold Pulse Width: 0.4 ms
Lead Channel Sensing Intrinsic Amplitude: 2.75 mV
Lead Channel Sensing Intrinsic Amplitude: 2.75 mV
Lead Channel Sensing Intrinsic Amplitude: 4 mV
Lead Channel Sensing Intrinsic Amplitude: 4 mV
Lead Channel Setting Pacing Amplitude: 1.5 V
Lead Channel Setting Pacing Amplitude: 1.5 V
Lead Channel Setting Pacing Pulse Width: 0.4 ms
Lead Channel Setting Sensing Sensitivity: 1.2 mV
Zone Setting Status: 755011
Zone Setting Status: 755011

## 2023-08-13 NOTE — Progress Notes (Signed)
 Because this visit was a virtual/telehealth visit,  certain criteria was not obtained, such a blood pressure, CBG if applicable, and timed get up and go. Any medications not marked as taking were not mentioned during the medication reconciliation part of the visit. Any vitals not documented were not able to be obtained due to this being a telehealth visit or patient was unable to self-report a recent blood pressure reading due to a lack of equipment at home via telehealth. Vitals that have been documented are verbally provided by the patient.   This visit was performed by a medical professional under my direct supervision. I was immediately available for consultation/collaboration. I have reviewed and agree with the Annual Wellness Visit documentation.  Subjective:   Shannon [name] is a 73 y.o. who presents for a Medicare Wellness preventive visit.  As a reminder, Annual Wellness Visits don't include a physical exam, and some assessments may be limited, especially if this visit is performed virtually. We may recommend an in-person follow-up visit with your provider if needed.  Visit Complete: Virtual I connected with  Shannon [name] on 08/13/23 by a audio enabled telemedicine application and verified that I am speaking with the correct person using two identifiers.  Patient Location: Home  Provider Location: Home Office  I discussed the limitations of evaluation and management by telemedicine. The patient expressed understanding and agreed to proceed.  Vital Signs: Because this visit was a virtual/telehealth visit, some criteria may be missing or patient reported. Any vitals not documented were not able to be obtained and vitals that have been documented are patient reported.  VideoDeclined- This patient declined Librarian, academic. Therefore the visit was completed with audio only.  Persons Participating in Visit: Patient.  AWV Questionnaire: Yes:  Patient Medicare AWV questionnaire was completed by the patient on 08/09/2023; I have confirmed that all information answered by patient is correct and no changes since this date.  Cardiac Risk Factors include: advanced age (>30men, >83 women);hypertension;Other (see comment), Risk factor comments: afib ,chf     Objective:    Today's Vitals   08/13/23 1148  BP: (!) 146/80  Pulse: 82  SpO2: 93%  Weight: 186 lb (84.4 kg)  Height: 5' 7 (1.702 m)   Body mass index is 29.13 kg/m.     08/13/2023   11:47 AM 07/30/2023    6:16 AM 07/07/2023    2:53 PM 07/06/2023    3:13 PM 07/01/2023   12:03 PM 03/07/2023    9:00 AM 03/06/2023   11:41 PM  Advanced Directives  Does Patient Have a Medical Advance Directive? No No No No No No No  Would patient like information on creating a medical advance directive? No - Patient declined No - Patient declined No - Patient declined   No - Patient declined     Current Medications (verified) Outpatient Encounter Medications as of 08/13/2023  Medication Sig   acetaminophen  (TYLENOL ) 500 MG tablet Take 1,000 mg by mouth every 6 (six) hours as needed for moderate pain or headache.   amiodarone  (PACERONE ) 200 MG tablet Take 1 tablet (200 mg total) by mouth 2 (two) times daily for 10 days, THEN 1 tablet (200 mg total) daily.   benzonatate  (TESSALON ) 100 MG capsule Take 1 capsule (100 mg total) by mouth 3 (three) times daily as needed for cough.   busPIRone  (BUSPAR ) 5 MG tablet Take 1 tablet (5 mg total) by mouth 2 (two) times daily.   Cholecalciferol (VITAMIN D ) 50  MCG (2000 UT) CAPS Take 2,000 Units by mouth in the morning.   ELIQUIS  5 MG TABS tablet TAKE 1 TABLET BY MOUTH TWICE  DAILY   empagliflozin  (JARDIANCE ) 10 MG TABS tablet Take 1 tablet (10 mg total) by mouth daily before breakfast.   Hydrocortisone (CORTIZONE-10 EX) Apply 1 application  topically as needed (skin irritation/itching).   levalbuterol  (XOPENEX  HFA) 45 MCG/ACT inhaler Inhale 2 puffs into the lungs  every 4 (four) hours as needed for wheezing.   loratadine (CLARITIN) 10 MG tablet Take 10 mg by mouth daily as needed for allergies.   metoprolol  succinate (TOPROL -XL) 100 MG 24 hr tablet Take 1 tablet (100 mg total) by mouth 2 (two) times daily. Take with or immediately following a meal.   Multiple Vitamin (MULTIVITAMIN) tablet Take 1 tablet by mouth at bedtime.   simvastatin  (ZOCOR ) 20 MG tablet TAKE 1 TABLET BY MOUTH DAILY   spironolactone  (ALDACTONE ) 25 MG tablet Take 0.5 tablets (12.5 mg total) by mouth daily.   budesonide -formoterol  (SYMBICORT ) 80-4.5 MCG/ACT inhaler Inhale 2 puffs into the lungs 2 (two) times daily. (Patient not taking: Reported on 08/13/2023)   No facility-administered encounter medications on file as of 08/13/2023.    Allergies (verified) Definity  [perflutren  lipid microsphere], Ivp dye [iodinated contrast media], Sulfur, Ace inhibitors, Amiodarone , Hctz [hydrochlorothiazide ], Oxycodone, Prednisone , Sulfa antibiotics, and Voltaren [diclofenac sodium]   History: Past Medical History:  Diagnosis Date   Acute systolic heart failure (HCC) 07/08/2023   Allergy    Anemia    Anxiety    Arthritis    neck and lower back (03/25/2016)   Asthma 1990s X 1   short term inhaler use    CAD (coronary artery disease)    Cardiac pacemaker in situ    MDT   CHF (congestive heart failure) (HCC)    Chronic lower back pain    COVID-19 03/06/2022   Degenerative disorder of bone    Depression    Diastolic dysfunction    Drug-induced lupus erythematosus    HCTZ induced; still gettin over it (03/25/2016)   Foot swelling 05/19/2022   GERD (gastroesophageal reflux disease)    Headache, unspecified headache type 03/06/2022   Herniated disc, cervical    Hospital discharge follow-up 03/14/2022   Hyperlipidemia    Hypertension    Neuromuscular disorder (HCC)    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 HTN   Obesity (BMI 30-39.9) 11/07/2022   Orthostatic hypotension     OSA on CPAP    Osteopenia    Osteoporosis 2012   PAF (paroxysmal atrial fibrillation) (HCC)    Paroxysmal atrial fibrillation (HCC) 12/13/2021   Phlebitis after infusion 03/18/2023   Pinched nerve in neck    PND (post-nasal drip) 12/18/2022   Rapid atrial fibrillation (HCC) 07/06/2023   Sinus node dysfunction (HCC)    Sinus pressure 12/18/2022   Sleep apnea    wears CPAP   Stroke (HCC)    T12 compression fracture (HCC) 11/2015   Vitamin D  deficiency    Whiplash injury 06/07/2010   Past Surgical History:  Procedure Laterality Date   APPENDECTOMY  1990s   ATRIAL FIBRILLATION ABLATION N/A 03/25/2016   Procedure: Atrial Fibrillation Ablation;  Surgeon: Lynwood Rakers, MD;  Location: [facility] INVASIVE CV LAB;  Service: Cardiovascular;  Laterality: N/A;   ATRIAL FIBRILLATION ABLATION N/A 01/31/2020   Procedure: ATRIAL FIBRILLATION ABLATION;  Surgeon: Rakers Lynwood, MD;  Location: [facility] INVASIVE CV LAB;  Service: Cardiovascular;  Laterality: N/A;   ATRIAL FIBRILLATION ABLATION  N/A 12/13/2021   Procedure: ATRIAL FIBRILLATION ABLATION;  Surgeon: [name], MD;  Location: [facility] INVASIVE CV LAB;  Service: Cardiovascular;  Laterality: N/A;   CARDIOVERSION N/A 07/09/2023   Procedure: CARDIOVERSION;  Surgeon: Okey Vina GAILS, MD;  Location: [facility] INVASIVE CV LAB;  Service: Cardiovascular;  Laterality: N/A;   COLONOSCOPY     FOREARM FRACTURE SURGERY Left ~ 02/2011   broke arm; shattered wrist   FOREARM HARDWARE REMOVAL Left ~ 07/2011   implantable loop recorder placement  03/07/2019   Medtronic Reveal Linq model LNQ 22 implantable loop recorder ([id number]) implanted by Dr Kelsie for Afib management   INSERT / REPLACE / REMOVE PACEMAKER  2025   LOOP RECORDER REMOVAL N/A 03/09/2023   Procedure: LOOP RECORDER REMOVAL;  Surgeon: Kennyth Chew, MD;  Location: [facility] INVASIVE CV LAB;  Service: Cardiovascular;  Laterality: N/A;   PACEMAKER IMPLANT N/A 03/09/2023   Procedure: PACEMAKER IMPLANT;  Surgeon:  Kennyth Chew, MD;  Location: [facility] INVASIVE CV LAB;  Service: Cardiovascular;  Laterality: N/A;   Spinal Nerve Ablation     TEE WITHOUT CARDIOVERSION N/A 03/24/2016   Procedure: TRANSESOPHAGEAL ECHOCARDIOGRAM (TEE);  Surgeon: Jerel Balding, MD;  Location: [facility] ENDOSCOPY;  Service: Cardiovascular;  Laterality: N/A;   Family History  Problem Relation Age of Onset   Lung cancer Mother    Cancer Mother    Early death Mother    Miscarriages / [place] Mother    Vision loss Mother    Stroke Father    Hypertension Father    Heart disease Father    Hypertension Maternal Grandmother    Stroke Maternal Grandfather    Heart disease Maternal Grandfather    Vision loss Maternal Grandfather    Diabetes Paternal Grandmother    Heart disease Paternal Grandmother    Diabetes Paternal Grandfather    Hearing loss Paternal Grandfather    Vision loss Paternal Grandfather    Bipolar disorder Daughter    Other Daughter        fatty liver   Other Son        fattye liver, born with 1 kidney   ADD / ADHD Daughter    Alcohol abuse Daughter    Depression Daughter    Hypertension Daughter    Learning disabilities Daughter    Miscarriages / Stillbirths Daughter    Obesity Daughter    Vision loss Daughter    ADD / ADHD Son    Birth defects Son    Diabetes Son    Hearing loss Son    Learning disabilities Son    Obesity Son    Vision loss Son    Heart disease Brother    Hypertension Brother    Heart disease Sister    Hypertension Sister    Vision loss Sister    Obesity Sister    Colon cancer Neg Hx    Esophageal cancer Neg Hx    Rectal cancer Neg Hx    Stomach cancer Neg Hx    Social History   Socioeconomic History   Marital status: Married    Spouse name: Not on file   Number of children: 2   Years of education: Not on file   Highest education level: Some college, no degree  Occupational History   Occupation: retired  Tobacco Use   Smoking status: Former    Current packs/day: 0.00     Average packs/day: 0.5 packs/day for 44.0 years (22.0 ttl pk-yrs)    Types: Cigarettes, E-cigarettes    Start  date: 69    Quit date: 09/06/2013    Years since quitting: 9.9   Smokeless tobacco: Never  Vaping Use   Vaping status: Former  Substance and Sexual Activity   Alcohol use: Not Currently    Comment: 03/25/2016 nothing for a couple years now; was having a drink on anniversary and Christmas   Drug use: No   Sexual activity: Not Currently    Birth control/protection: Post-menopausal  Other Topics Concern   Not on file  Social History Narrative   Retired. Worked for [employer] with daughter.    Social Drivers of Corporate investment banker Strain: Low Risk  (08/09/2023)   Overall Financial Resource Strain (CARDIA)    Difficulty of Paying Living Expenses: Not very hard  Recent Concern: Financial Resource Strain - Medium Risk (06/15/2023)   Overall Financial Resource Strain (CARDIA)    Difficulty of Paying Living Expenses: Somewhat hard  Food Insecurity: No Food Insecurity (08/09/2023)   Hunger Vital Sign    Worried About Running Out of Food in the Last Year: Never true    Ran Out of Food in the Last Year: Never true  Transportation Needs: No Transportation Needs (08/09/2023)   PRAPARE - Administrator, Civil Service (Medical): No    Lack of Transportation (Non-Medical): No  Physical Activity: Inactive (08/09/2023)   Exercise Vital Sign    Days of Exercise per Week: 0 days    Minutes of Exercise per Session: 0 min  Stress: Stress Concern Present (08/09/2023)   [employer] of Occupational Health - Occupational Stress Questionnaire    Feeling of Stress: To some extent  Social Connections: Moderately Isolated (08/13/2023)   Social Connection and Isolation Panel    Frequency of Communication with Friends and Family: More than three times a week    Frequency of Social Gatherings with Friends and Family: Once a week    Attends Religious Services: 1 to  4 times per year    Active Member of [employer] or Organizations: No    Attends Banker Meetings: Never    Marital Status: Widowed    Tobacco Counseling Counseling given: Not Answered    Clinical Intake:  Pre-visit preparation completed: Yes  Pain : No/denies pain     BMI - recorded: 29.13 Nutritional Status: BMI 25 -29 Overweight Nutritional Risks: None Diabetes: No  Lab Results  Component Value Date   HGBA1C 5.6 05/13/2021   HGBA1C 5.7 (H) 02/07/2021   HGBA1C 5.7 01/03/2021     How often do you need to have someone help you when you read instructions, pamphlets, or other written materials from your doctor or pharmacy?: 1 - Never What is the last grade level you completed in school?: some college  Interpreter Needed?: No  Information entered by :: Mavryk Pino,CMA   Activities of Daily Living     08/09/2023    9:10 AM 07/07/2023    2:53 PM  In your present state of health, do you have any difficulty performing the following activities:  Hearing? 0 1  Vision? 0 0  Difficulty concentrating or making decisions? 0 1  Walking or climbing stairs? 1   Dressing or bathing? 0   Doing errands, shopping? 0 1  Comment  Pt states she does not drive. Daughter drives her to appointments/errands.  Preparing Food and eating ? N   Using the Toilet? N   In the past six months, have you accidently  leaked urine? Y   Do you have problems with loss of bowel control? N   Managing your Medications? N   Managing your Finances? N   Housekeeping or managing your Housekeeping? N     Patient Care Team: Knute [name], FNP as PCP - General (Family Medicine) [name], MD as PCP - Electrophysiology (Cardiology) Pandora Cadet, [facility] as Pharmacist (Pharmacist)  I have updated your Care Teams any recent Medical Services you may have received from other providers in the past year.     Assessment:   This is a routine wellness examination for  [place].  Hearing/Vision screen Hearing Screening - Comments:: Patient has hearing aids  Vision Screening - Comments:: Patient wears glasses   Goals Addressed             This Visit's Progress    Patient Stated       To get healthy        Depression Screen     08/13/2023   11:53 AM 07/13/2023    3:40 PM 07/13/2023   12:09 PM 04/16/2023    4:13 PM 03/18/2023    4:03 PM 12/18/2022    3:27 PM 11/07/2022    2:29 PM  PHQ 2/9 Scores  PHQ - 2 Score 0 0 0 0 1 0 2  PHQ- 9 Score 3 2  4 3 3 8     Fall Risk     08/09/2023    9:10 AM 07/13/2023    3:40 PM 07/13/2023   12:10 PM 06/08/2023    9:30 AM 04/16/2023    4:13 PM  Fall Risk   Falls in the past year? 1 1 1 1  0  Number falls in past yr: 0 0 0 0 0  Injury with Fall? 1 0 0 1 0  Risk for fall due to : No Fall Risks Impaired vision;Impaired balance/gait   No Fall Risks  Follow up Falls evaluation completed Falls evaluation completed   Falls evaluation completed    MEDICARE RISK AT HOME:  Medicare Risk at Home Any stairs in or around the home?: (Patient-Rptd) Yes If so, are there any without handrails?: (Patient-Rptd) Yes Home free of loose throw rugs in walkways, pet beds, electrical cords, etc?: (Patient-Rptd) No Adequate lighting in your home to reduce risk of falls?: (Patient-Rptd) Yes Life alert?: (Patient-Rptd) No Use of a cane, walker or w/c?: (Patient-Rptd) No Grab bars in the bathroom?: (Patient-Rptd) Yes Shower chair or bench in shower?: (Patient-Rptd) Yes Elevated toilet seat or a handicapped toilet?: (Patient-Rptd) No  TIMED UP AND GO:  Was the test performed?  No  Cognitive Function: 6CIT completed        08/13/2023   11:55 AM 06/12/2022    3:01 PM 11/04/2021    1:09 PM  6CIT Screen  What Year? 0 points 0 points 0 points  What month? 0 points 0 points 0 points  What time? 0 points 0 points 0 points  Count back from 20 0 points 0 points 0 points  Months in reverse 0 points 0 points 0 points  Repeat phrase 0  points 0 points 2 points  Total Score 0 points 0 points 2 points    Immunizations Immunization History  Administered Date(s) Administered   Fluad Quad(high Dose 65+) 10/12/2018, 12/23/2019   Influenza, High Dose Seasonal PF 11/20/2016   Influenza,inj,Quad PF,6+ Mos 12/07/2014, 11/27/2015, 10/28/2017   Influenza-Unspecified 10/06/2013, 11/07/2022   Moderna Sars-Covid-2 Vaccination 02/19/2019, 03/20/2019, 01/04/2020   Pneumococcal Conjugate-13 09/23/2011  Pneumococcal Polysaccharide-23 12/08/2017   Tdap 09/08/2006, 05/17/2021   Zoster, Live 09/23/2011    Screening Tests Health Maintenance  Topic Date Due   Hepatitis C Screening  Never done   Colonoscopy  02/23/2021   INFLUENZA VACCINE  08/07/2023   COVID-19 Vaccine (4 - 2024-25 season) 12/18/2023 (Originally 09/07/2022)   Zoster Vaccines- Shingrix  (1 of 2) 10/31/2024 (Originally 06/12/1969)   MAMMOGRAM  01/27/2024   Lung Cancer Screening  06/30/2024   Medicare Annual Wellness (AWV)  08/12/2024   DTaP/Tdap/Td (3 - Td or Tdap) 05/18/2031   Pneumococcal Vaccine: 50+ Years  Completed   DEXA SCAN  Completed   Hepatitis B Vaccines  Aged Out   HPV VACCINES  Aged Out   Meningococcal B Vaccine  Aged Out   Fecal DNA (Cologuard)  Discontinued    Health Maintenance  Health Maintenance Due  Topic Date Due   Hepatitis C Screening  Never done   Colonoscopy  02/23/2021   INFLUENZA VACCINE  08/07/2023   Health Maintenance Items Addressed:patient declined the colonoscopy at this time and vaccinations  Additional Screening:  Vision Screening: Recommended annual ophthalmology exams for early detection of glaucoma and other disorders of the eye. Would you like a referral to an eye doctor? No    Dental Screening: Recommended annual dental exams for proper oral hygiene  Community Resource Referral / Chronic Care Management: CRR required this visit?  No   CCM required this visit?  No   Plan:    I have personally reviewed and noted  the following in the patient's chart:   Medical and social history Use of alcohol, tobacco or illicit drugs  Current medications and supplements including opioid prescriptions. Patient is not currently taking opioid prescriptions. Functional ability and status Nutritional status Physical activity Advanced directives List of other physicians Hospitalizations, surgeries, and ER visits in previous 12 months Vitals Screenings to include cognitive, depression, and falls Referrals and appointments  In addition, I have reviewed and discussed with patient certain preventive protocols, quality metrics, and best practice recommendations. A written personalized care plan for preventive services as well as general preventive health recommendations were provided to patient.   Lyle MARLA Right, [place]   08/13/2023   After Visit Summary: (MyChart) Due to this being a telephonic visit, the after visit summary with patients personalized plan was offered to patient via MyChart   Notes: Nothing significant to report at this time.

## 2023-08-13 NOTE — Patient Instructions (Addendum)
 Medication Instructions:    STOP TAKING AND REMOVE THIS MEDICATION FROM YOUR MEDICATION LIST:  AMIODARONE    *If you need a refill on your cardiac medications before your next appointment, please call your pharmacy*   Lab Work:    PLEASE GO DOWN STAIRS  [facility]  FIRST FLOOR   ( GET OFF ELEVATORS WALK TOWARDS WAITING AREA LAB LOCATED BY PHARMACY):  BMET AND CBC TODAY      If you have labs (blood work) drawn today and your tests are completely normal, you will receive your results only by: MyChart Message (if you have MyChart) OR A paper copy in the mail If you have any lab test that is abnormal or we need to change your treatment, we will call you to review the results.    Testing/Procedures:  SEE LETTER ATTACHED Your physician has recommended that you have an ablation. Catheter ablation is a medical procedure used to treat some cardiac arrhythmias (irregular heartbeats). During catheter ablation, a long, thin, flexible tube is put into a blood vessel in your groin (upper thigh), or neck. This tube is called an ablation catheter. It is then guided to your heart through the blood vessel. Radio frequency waves destroy small areas of heart tissue where abnormal heartbeats may cause an arrhythmia to start. Please see the instruction sheet given to you today.     Follow-Up: At [facility], you and your health needs are our priority.  As part of our continuing mission to provide you with exceptional heart care, our providers are all part of one team.  This team includes your primary Cardiologist (physician) and Advanced Practice Providers or APPs (Physician Assistants and Nurse Practitioners) who all work together to provide you with the care you need, when you need it.  Your next appointment:  (  YOU WILL BE CONTACT ED  BY  CASSIE HALL/ ANGELINE HAMMER FOR EP SCHEDULING  FOLLOW UP APPOINTMENTS)    We recommend signing up for the patient portal called MyChart.  Sign up  information is provided on this After Visit Summary.  MyChart is used to connect with patients for Virtual Visits (Telemedicine).  Patients are able to view lab/test results, encounter notes, upcoming appointments, etc.  Non-urgent messages can be sent to your provider as well.   To learn more about what you can do with MyChart, go to [email or web address].   Other Instructions

## 2023-08-13 NOTE — Patient Instructions (Signed)
 Shannon [name] , Thank you for taking time out of your busy schedule to complete your Annual Wellness Visit with me. I enjoyed our conversation and look forward to speaking with you again next year. I, as well as your care team,  appreciate your ongoing commitment to your health goals. Please review the following plan we discussed and let me know if I can assist you in the future. Your Game plan/ To Do List    Referrals: If you haven't heard from the office you've been referred to, please reach out to them at the phone provided.   Follow up Visits: We will see or speak with you next year for your Next Medicare AWV with our clinical staff Have you seen your provider in the last 6 months (3 months if uncontrolled diabetes)? Yes  Clinician Recommendations:  Aim for 30 minutes of exercise or brisk walking, 6-8 glasses of water, and 5 servings of fruits and vegetables each day.       This is a list of the screenings recommended for you:  Health Maintenance  Topic Date Due   Hepatitis C Screening  Never done   Colon Cancer Screening  02/23/2021   Medicare Annual Wellness Visit  06/12/2023   Flu Shot  08/07/2023   COVID-19 Vaccine (4 - 2024-25 season) 12/18/2023*   Zoster (Shingles) Vaccine (1 of 2) 10/31/2024*   Mammogram  01/27/2024   Screening for Lung Cancer  06/30/2024   DTaP/Tdap/Td vaccine (3 - Td or Tdap) 05/18/2031   Pneumococcal Vaccine for age over 88  Completed   DEXA scan (bone density measurement)  Completed   Hepatitis B Vaccine  Aged Out   HPV Vaccine  Aged Out   Meningitis B Vaccine  Aged Out   Cologuard (Stool DNA test)  Discontinued  *Topic was postponed. The date shown is not the original due date.    Advanced directives: (Declined) Advance directive discussed with you today. Even though you declined this today, please call our office should you change your mind, and we can give you the proper paperwork for you to fill out. Advance Care Planning is important because  it:  [x]  Makes sure you receive the medical care that is consistent with your values, goals, and preferences  [x]  It provides guidance to your family and loved ones and reduces their decisional burden about whether or not they are making the right decisions based on your wishes.  Follow the link provided in your after visit summary or read over the paperwork we have mailed to you to help you started getting your Advance Directives in place. If you need assistance in completing these, please reach out to us  so that we can help you!  See attachments for Preventive Care and Fall Prevention Tips.

## 2023-08-14 ENCOUNTER — Ambulatory Visit: Payer: Self-pay | Admitting: Cardiology

## 2023-08-14 NOTE — Telephone Encounter (Signed)
 Spoke with the patient about her breathing concerns. She had to use her inhaler twice last night. She states that she has been feeling worse since switching to spironolactone . There is a previous note in her chart from [facility], [place] about switching back to Lasix . Patient would like to stop spironolactone  and go back on lasix . She is aware of ER precautions for any worsening symptoms.

## 2023-08-14 NOTE — Telephone Encounter (Signed)
 See previous message

## 2023-08-19 ENCOUNTER — Telehealth: Payer: Self-pay

## 2023-08-19 DIAGNOSIS — Z79899 Other long term (current) drug therapy: Secondary | ICD-10-CM | POA: Diagnosis not present

## 2023-08-19 NOTE — Progress Notes (Signed)
 Cardiology Office Note:  .   Date:  08/20/2023  ID:  Shannon [name], DOB 11-03-1950, MRN [id number] PCP: Knute [name], FNP   HeartCare Providers Cardiologist:  None Electrophysiologist:  OLE ONEIDA HOLTS, MD {  History of Present Illness: .   Shannon [name] is a 73 y.o. female with history of persistent atrial fibrillation status post ablation 12/2021, multiple failed DCCV's, SSS status post PPM (Medtronic 03/2023), heart failure with with reduced EF, hypertension, OSA on CPAP, drug-induced lupus, CVA.     Persistent atrial fibrillation Diagnosed 2017  Status post 3 ablations.  Last being ablation 12/2021. Flecainide  stopped due to fatigue.  Failed Tikosyn  due to QT prolongation.  Intolerant to amiodarone  due to blurry vision and fatigue shortness of breath. Admitted 07/2023 for recurrent A-fib RVR after recent viral infection.  In the ED he had 2 unsuccessful DCCV's at 200 J.  EP had recommended increasing to 360 J and had successful cardioversion in the lab.  EF 30 to 35% with biatrial enlargement. Early recurrence of atrial fibrillation 07/2023.  Started back on amiodarone , did not tolerate, stopped.  Plan now for ablation.  HFrEF EF 60 to 65% 10/2022 NM PET/CT 06/2023, EF reduced 38%.  No wall motion abnormalities.  Flow reserve 1.77 moderate coronary calcifications.  No evidence of ischemia.  Low risk study. 07/2023 EF 30 to 35% in the setting of A-fib RVR. Possibly tachycardia mediated  Social history  Reports husband has recently deceased Lives with her daughter Has multiple medication intolerances      Patient with history of persistent atrial fibrillation status post multiple ablations and failed antiarrhythmic history with recurrence and admission 07/2023 with recent viral infection who underwent 2 failed DCCV's and finally had successful cardioversion at 360 J.  I saw her in clinic following hospitalization and she was in sinus rhythm at that  time.  Reportedly still getting over her viral symptoms.  I had stopped her losartan  and placed her on Entresto  24-26 mg and Jardiance .  She then saw EP the day after who had reluctance to pursue ablation and wanted to give her more time to get over her viral illness.  She then was seen in the emergency room 7/24 and found to be in atrial fibrillation again.  They decided on retrying amiodarone .  Entresto  was discontinued for hypotension although seems to have had isolated reading.  She sent me a blood pressure log with BP between 150-170 while on the Entresto .  Did not want to be back on it.  Since then she has been sending multiple MyChart messages with complaints of fatigue and shortness of breath.  She attributed shortness of breath to the amiodarone  after taking 2 doses and had messaged EP. Eventually I started spironolactone  and stopped her Lasix  and potassium supplementation.  She saw EP once again 8/7 and reported to feeling very tired and at that time.  They agreed upon pursuing ablation, amio stopped.  The day after she started reporting shortness of breath that she felt related to her spironolactone .  EP did not note any volume issues in their note.  There was some miscommunication between staff messages as I wanted her to be on spironolactone  and Lasix  given worsening shortness of breath and had given instructions to take extra dose of Lasix  if need be.  However spironolactone  then was stopped.  Today patient presents for follow-up.  She is extremely tearful throughout the visit as she has been completely overwhelmed with everything and really is  frustrated that things keep happening to her.  She reports her husband has recently died who was a significant emotional burden as he had multiple medical issues and she reports always being in a fight or flight state.  With his passing she feels that she can now focus on herself but still feels overwhelmed.  She is frustrated that she is not tolerating these  medications, but wants to be on them.  She has since stopped her amiodarone , spironolactone , Entresto  all due to shortness of breath.  Symptoms in the middle of the night she will wake up and rip off her CPAP and start having an attack of shortness of breath but she feels that this could be related to her anxiety.  Also has random panting episodes and feels that she cannot catch her breath.  However she did report after stopping some of her medications such as the spironolactone  her shortness of breath improved within a couple days.  Her weights have been stable. She continues to log her blood pressure and they have all been persistently 150-170 systolics.  She is compliant with all of her medications.  All the time she just feels extremely fatigued and tired.  ROS: Denies: Chest pain, orthopnea, peripheral edema, lightheadedness.  Denies any fever, chills.  Does report persistent cough with sputum production that appears pink.  Not bloody.  Studies Reviewed: .         Risk Assessment/Calculations:    CHA2DS2-VASc Score = 6   This indicates a 9.7% annual risk of stroke. The patient's score is based upon: CHF History: 1 HTN History: 1 Diabetes History: 0 Stroke History: 2 Vascular Disease History: 0 Age Score: 1 Gender Score: 1      Physical Exam:   VS:  BP (!) 162/94   Pulse 80   Ht 5' 7 (1.702 m)   Wt 188 lb 6.4 oz (85.5 kg)   SpO2 95%   BMI 29.51 kg/m    Wt Readings from Last 3 Encounters:  08/20/23 188 lb 6.4 oz (85.5 kg)  08/13/23 188 lb 6.4 oz (85.5 kg)  08/13/23 186 lb (84.4 kg)    GEN: Well nourished, well developed in no acute distress NECK: No JVD; No carotid bruits CARDIAC: RRRR, no murmurs, rubs, gallops RESPIRATORY:  Clear to auscultation without rales, wheezing or rhonchi  ABDOMEN: Soft, non-tender, non-distended EXTREMITIES:  No edema; No deformity   ASSESSMENT AND PLAN: .    Persistent atrial fibrillation She has had 3 prior ablations and failed Tikosyn .   She is very symptomatic with her atrial fibrillation and feels terrible and overwhelmed by everything.  She had recurrence of atrial fibrillation 07/2023 in the setting of URI and underwent 2 unsuccessful DCCV's in the ER and then successful DCCV at 360 J.  Then later in the month found to be in atrial fibrillation again in the ER.  Started back on amiodarone , then self DC, and now has plans for ablation. Failed Tikosyn , flecainide ,  Continue with Eliquis  5 mg twice daily.  100% compliant Continue with Toprol -XL 100 mg twice daily.  Reviewed device report, heart rates are not completely ideal however given below changes and her intolerance to multiple medications not wanting to change too many things at once and contribute more to her fatigue. Heart rates today in the 80s. TSH was normal 5 months ago   Chronic HFrEF Potentially could be tachycardia mediated.  Echocardiogram from recent admission demonstrates EF 30 to 35% with global hypokinesis, normal RV function.  Moderately dilated biatrial.  No significant valvular disease.  She has been reporting shortness of breath although looks completely euvolemic and has had stable weight.  Suspect shortness of breath is more likely related to severe anxiety.  Seems unlikely that her symptoms are related to medications as well.  She is euvolemic on exam with no evidence of volume whatsoever.  And weight has been stable. She stopped Entresto  due to hypotension although I feel this was an isolated reading.  Her blood pressure log demonstrated blood pressures 150-170 on Entresto .  Starting olmesartan  20 mg.  Get BMP 2 weeks from now. She stopped spironolactone  12.5 mg due to shortness of breath.  After ablation when things settle she would be willing to retrying some of these. Continue Jardiance  10 mg, Toprol -XL 100 mg twice daily, Lasix  20 mg.  She is also getting potassium supplementation 20 mEq daily.  NM PET/CT this year demonstrating decreased myocardial flow  reserve 0.72, moderate coronary artery calcifications but overall felt to be low risk study. Repeat limited echocardiogram 10/2023.  Additionally see note from 07/07/18/2025.  Appears that she had some sort of reaction to the contrast given during the echocardiogram and became unresponsive.  Was treated for allergic reaction. AVOID CONTRAST.    SSS status post Medtronic PPM EP is following this.  Normal device check 08/2023   Hyperlipidemia LDL 62 October 2024 Continue with simvastatin  20 mg daily.   OSA on CPAP Compliant   URI Possibly the driver some of her symptoms.  She still has persistent cough with sputum production and pink phlegm.  No obvious signs of fever/infection and this had multiple chest x-rays.  Asked her to follow-up with PCP.    Hypertension Uncontrolled but intolerant to multiple medications.  See above changes.  Blood pressure persistently 150-170.  She keeps a very close log of everything and checks frequently.   Severe anxiety She has multiple stressors going on in her life with recent passing of her husband.  Suspect this may be contributing to her overall presentation and she agrees.  Tried to provide reassurance but she is extremely worried about everything.  I have asked her to get in touch with her mental health counselor and to follow-up with PCP to consider antidepressants/anxiety meds.  Dispo: We have scheduled the ablation for 9/9 and have moved this to the earliest date.  She needs to follow-up with EP shortly after that.  I will see her in October to further manage her blood pressure and heart failure.  Signed, Thom LITTIE Sluder, PA-C

## 2023-08-19 NOTE — Telephone Encounter (Signed)
 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 Cindie.   Patient states she is becoming more fatigued with SOB and weakness.  She is very aware when she is in AF especially with fast heart rates.  States it is becoming more of a struggle to get through each day.  She is taking her Metoprolol  succinate 100mg  bid as prescribed and sees Josefa Beauvais, NP here at [facility] tomorrow.  Will cc him on this update as well.   PRESENTING EGM AT 6AM on 08/18/23 time of transmission:

## 2023-08-20 ENCOUNTER — Ambulatory Visit: Payer: Self-pay | Admitting: Cardiology

## 2023-08-20 ENCOUNTER — Telehealth: Payer: Self-pay

## 2023-08-20 ENCOUNTER — Ambulatory Visit: Attending: General Practice | Admitting: Cardiology

## 2023-08-20 ENCOUNTER — Other Ambulatory Visit: Payer: Self-pay

## 2023-08-20 VITALS — BP 162/94 | HR 80 | Ht 67.0 in | Wt 188.4 lb

## 2023-08-20 DIAGNOSIS — I4819 Other persistent atrial fibrillation: Secondary | ICD-10-CM

## 2023-08-20 DIAGNOSIS — G4733 Obstructive sleep apnea (adult) (pediatric): Secondary | ICD-10-CM | POA: Diagnosis not present

## 2023-08-20 DIAGNOSIS — I10 Essential (primary) hypertension: Secondary | ICD-10-CM

## 2023-08-20 DIAGNOSIS — Z950 Presence of cardiac pacemaker: Secondary | ICD-10-CM | POA: Diagnosis not present

## 2023-08-20 DIAGNOSIS — I5020 Unspecified systolic (congestive) heart failure: Secondary | ICD-10-CM

## 2023-08-20 LAB — BASIC METABOLIC PANEL WITH GFR
BUN/Creatinine Ratio: 15 (ref 12–28)
BUN: 13 mg/dL (ref 8–27)
CO2: 24 mmol/L (ref 20–29)
Calcium: 9.8 mg/dL (ref 8.7–10.3)
Chloride: 101 mmol/L (ref 96–106)
Creatinine, Ser: 0.86 mg/dL (ref 0.57–1.00)
Glucose: 102 mg/dL — ABNORMAL HIGH (ref 70–99)
Potassium: 4.5 mmol/L (ref 3.5–5.2)
Sodium: 141 mmol/L (ref 134–144)
eGFR: 72 mL/min/1.73 (ref >59)

## 2023-08-20 MED ORDER — OLMESARTAN MEDOXOMIL 20 MG PO TABS: 20.0000 mg | ORAL_TABLET | Freq: Every day | ORAL | 3 refills | Status: DC

## 2023-08-20 NOTE — Telephone Encounter (Signed)
 Called pt to go over her Ablation Instructions - She has been rescheduled from 9/29 to 9/9 at 12:30 pm.   She will get updated labs done at [facility] on 8/25.  I will update her Instruction letter and send it via MyChart.

## 2023-08-20 NOTE — Patient Instructions (Signed)
 Medication Instructions:  Your physician has recommended you make the following change in your medication:  START OLMESARTAN   20 MG DAILY.  *If you need a refill on your cardiac medications before your next appointment, please call your pharmacy*  Lab Work: TO BE DONE IN 2 WEEKS: BMET  If you have labs (blood work) drawn today and your tests are completely normal, you will receive your results only by: MyChart Message (if you have MyChart) OR A paper copy in the mail If you have any lab test that is abnormal or we need to change your treatment, we will call you to review the results.  Testing/Procedures: NONE  Follow-Up: At [facility], you and your health needs are our priority.  As part of our continuing mission to provide you with exceptional heart care, our providers are all part of one team.  This team includes your primary Cardiologist (physician) and Advanced Practice Providers or APPs (Physician Assistants and Nurse Practitioners) who all work together to provide you with the care you need, when you need it.  Your next appointment:   AFTER ABLATION   Provider:   Ole Holts, MD   Other Instructions Please check your blood pressure 1-2 times per day for 2 weeks and send readings to [facility], PA-C.

## 2023-08-21 ENCOUNTER — Encounter: Payer: Self-pay | Admitting: Behavioral Health

## 2023-08-21 ENCOUNTER — Ambulatory Visit (INDEPENDENT_AMBULATORY_CARE_PROVIDER_SITE_OTHER): Admitting: Behavioral Health

## 2023-08-21 DIAGNOSIS — F4323 Adjustment disorder with mixed anxiety and depressed mood: Secondary | ICD-10-CM | POA: Diagnosis not present

## 2023-08-21 NOTE — Progress Notes (Signed)
 [facility] Counselor/Therapist Progress Note  Patient ID: Shannon [name], MRN: [id number],    Date:,  8/15/ 2025  Time Spent: 58 minutes, 3:00pm until 3:58 PM this session was held via video teletherapy. The patient consented to the video teletherapy and was located in her home during this session. She is aware it is the responsibility of the patient to secure confidentiality on her end of the session. The provider was in a private home office for the duration of this session.     Reported Symptoms: Anxiety, depression  Mental Status Exam: Appearance:  Well Groomed     Behavior: Appropriate  Motor: Normal  Speech/Language:  Clear and Coherent  Affect: Appropriate for  Mood: normal  Thought process: normal  Thought content:   WNL  Sensory/Perceptual disturbances:   WNL  Orientation: oriented to person, place, time/date, situation, day of week, month of year, and year  Attention: Good  Concentration: Good  Memory: WNL  Fund of knowledge:  Good  Insight:   Good  Judgment:  Good  Impulse Control: Good   Risk Assessment: Danger to Self:  No Self-injurious Behavior: No Danger to Others: No Duty to Warn:no Physical Aggression / Violence:No  Access to Firearms a concern: No  Gang Involvement:No   Subjective: The patient is not feeling well physically and has not for the past several weeks.  She does not have much energy.  She is not breathing well.  She knows that her anxiety has spiked some of her medical issues so we talked about some different ways that she could manage her anxiety.  Also encouraged her to speak to her primary care physician about possible medication to help with anxiety that would also mess well with her heart medication.  She is scheduled for a procedure in early September and we will go sooner if they had availability.  She is hopeful that will reduce some of the A-fib.  Encouraged her to recognize and challenge some of those anxious or  negative thoughts related to her medical issues primarily but somewhat related to her relationship with her husband who recently passed away.  She does contract for safety having no thoughts of hurting herself or anyone else. Interventions: Cognitive Behavioral Therapy  Diagnosis: Adjustment disorder with mixed anxiety and depressed mood.  Plan: I will meet with the patient every 2 weeks via video session TX. Plan:To use cognitive behavioral therapy principles as well as elements of dialectical behavior therapy.  Goals are to reduce anxiety and depression by at least 50% with a target date of November 06, 2022.  Goals are to have less sadness as indicated by PHQ-9 scores as well as patient report.  We also work on improving mood and return to a healthier level of functioning as defined by her goals for being happy, identify causes and process triggers for depressed mood.  We will use cognitive behavioral therapy to explore and replace unhealthy thoughts and behavior patterns contributing to depression.  I will continue to encourage shearing of feelings related to the causes and symptoms of depression as well as teach and encouraged use of coping skills for management of depressive symptoms.  We also will work to improve the patient's ability to manage anxiety symptoms and better handle stress, identify causes for anxiety and explore ways for reduction of anxiety in addition to resolving conflicts contributing to anxiety and managing thoughts and worrisome thinking is contributing to anxiety.  Interventions will include providing education about anxiety, facilitate problem solution  skills, teaching coping skills for managing anxiety such as grounding exercises, progressive muscle relaxation and cognitive re framing etc.  We will also use cognitive behavioral therapy to identify and change anxiety provoking thoughts and behavior patterns as well as using DBT distress tolerance and mindfulness skills. I  reviewed the treatment plan goals with the patient who agreed to continue with goals as stated above. Progress: 35%New target date will be October 31st, 2025. Lorrene CHRISTELLA Hasten, [facility]                                                Lorrene CHRISTELLA Hasten, [facility]               Lorrene CHRISTELLA Hasten, [facility]               Lorrene CHRISTELLA Hasten, [facility]               Lorrene CHRISTELLA Hasten, [facility]               Lorrene CHRISTELLA Hasten, [facility]               Lorrene CHRISTELLA Hasten, [facility]               Lorrene CHRISTELLA Hasten, [facility]               Lorrene CHRISTELLA Hasten, [facility]               Lorrene CHRISTELLA Hasten, [facility]               Lorrene CHRISTELLA Hasten, [facility]               Lorrene CHRISTELLA Hasten, [facility]               Lorrene CHRISTELLA Hasten, [facility]

## 2023-08-21 NOTE — Telephone Encounter (Signed)
 Pt has been moved to 9/9 with Dr. Cindie for AV Node Ablation.

## 2023-08-23 DIAGNOSIS — I213 ST elevation (STEMI) myocardial infarction of unspecified site: Secondary | ICD-10-CM | POA: Diagnosis not present

## 2023-08-23 DIAGNOSIS — R0600 Dyspnea, unspecified: Secondary | ICD-10-CM | POA: Diagnosis not present

## 2023-08-23 DIAGNOSIS — R0902 Hypoxemia: Secondary | ICD-10-CM | POA: Diagnosis not present

## 2023-08-23 DIAGNOSIS — R0602 Shortness of breath: Secondary | ICD-10-CM | POA: Diagnosis not present

## 2023-08-23 DIAGNOSIS — I10 Essential (primary) hypertension: Secondary | ICD-10-CM | POA: Diagnosis not present

## 2023-08-24 ENCOUNTER — Encounter: Admitting: Cardiology

## 2023-08-26 ENCOUNTER — Encounter: Admitting: Cardiology

## 2023-08-27 ENCOUNTER — Emergency Department (HOSPITAL_BASED_OUTPATIENT_CLINIC_OR_DEPARTMENT_OTHER)

## 2023-08-27 ENCOUNTER — Encounter (HOSPITAL_BASED_OUTPATIENT_CLINIC_OR_DEPARTMENT_OTHER): Payer: Self-pay | Admitting: Emergency Medicine

## 2023-08-27 ENCOUNTER — Inpatient Hospital Stay (HOSPITAL_BASED_OUTPATIENT_CLINIC_OR_DEPARTMENT_OTHER)
Admission: EM | Admit: 2023-08-27 | Discharge: 2023-08-31 | DRG: 291 | Disposition: A | Discharge status: Home / Self Care | Attending: Internal Medicine | Admitting: Internal Medicine

## 2023-08-27 ENCOUNTER — Other Ambulatory Visit: Payer: Self-pay

## 2023-08-27 DIAGNOSIS — I110 Hypertensive heart disease with heart failure: Principal | ICD-10-CM | POA: Diagnosis present

## 2023-08-27 DIAGNOSIS — I159 Secondary hypertension, unspecified: Secondary | ICD-10-CM | POA: Diagnosis present

## 2023-08-27 DIAGNOSIS — I493 Ventricular premature depolarization: Secondary | ICD-10-CM | POA: Diagnosis present

## 2023-08-27 DIAGNOSIS — I471 Supraventricular tachycardia, unspecified: Secondary | ICD-10-CM | POA: Diagnosis present

## 2023-08-27 DIAGNOSIS — Z7984 Long term (current) use of oral hypoglycemic drugs: Secondary | ICD-10-CM | POA: Diagnosis not present

## 2023-08-27 DIAGNOSIS — Z7951 Long term (current) use of inhaled steroids: Secondary | ICD-10-CM

## 2023-08-27 DIAGNOSIS — Z833 Family history of diabetes mellitus: Secondary | ICD-10-CM | POA: Diagnosis not present

## 2023-08-27 DIAGNOSIS — Z950 Presence of cardiac pacemaker: Secondary | ICD-10-CM

## 2023-08-27 DIAGNOSIS — Z6833 Body mass index (BMI) 33.0-33.9, adult: Secondary | ICD-10-CM | POA: Diagnosis not present

## 2023-08-27 DIAGNOSIS — Z885 Allergy status to narcotic agent status: Secondary | ICD-10-CM

## 2023-08-27 DIAGNOSIS — E871 Hypo-osmolality and hyponatremia: Secondary | ICD-10-CM | POA: Diagnosis present

## 2023-08-27 DIAGNOSIS — I5023 Acute on chronic systolic (congestive) heart failure: Secondary | ICD-10-CM | POA: Diagnosis present

## 2023-08-27 DIAGNOSIS — Z79899 Other long term (current) drug therapy: Secondary | ICD-10-CM | POA: Diagnosis not present

## 2023-08-27 DIAGNOSIS — M353 Polymyalgia rheumatica: Secondary | ICD-10-CM | POA: Diagnosis present

## 2023-08-27 DIAGNOSIS — I2510 Atherosclerotic heart disease of native coronary artery without angina pectoris: Secondary | ICD-10-CM | POA: Diagnosis not present

## 2023-08-27 DIAGNOSIS — Z87891 Personal history of nicotine dependence: Secondary | ICD-10-CM

## 2023-08-27 DIAGNOSIS — Z8673 Personal history of transient ischemic attack (TIA), and cerebral infarction without residual deficits: Secondary | ICD-10-CM | POA: Diagnosis not present

## 2023-08-27 DIAGNOSIS — F32A Depression, unspecified: Secondary | ICD-10-CM | POA: Diagnosis present

## 2023-08-27 DIAGNOSIS — Z8616 Personal history of COVID-19: Secondary | ICD-10-CM

## 2023-08-27 DIAGNOSIS — I495 Sick sinus syndrome: Secondary | ICD-10-CM | POA: Diagnosis present

## 2023-08-27 DIAGNOSIS — I4891 Unspecified atrial fibrillation: Secondary | ICD-10-CM | POA: Diagnosis not present

## 2023-08-27 DIAGNOSIS — I4819 Other persistent atrial fibrillation: Secondary | ICD-10-CM | POA: Diagnosis present

## 2023-08-27 DIAGNOSIS — Z801 Family history of malignant neoplasm of trachea, bronchus and lung: Secondary | ICD-10-CM

## 2023-08-27 DIAGNOSIS — Z7901 Long term (current) use of anticoagulants: Secondary | ICD-10-CM

## 2023-08-27 DIAGNOSIS — Z823 Family history of stroke: Secondary | ICD-10-CM

## 2023-08-27 DIAGNOSIS — Z8249 Family history of ischemic heart disease and other diseases of the circulatory system: Secondary | ICD-10-CM | POA: Diagnosis not present

## 2023-08-27 DIAGNOSIS — Z91041 Radiographic dye allergy status: Secondary | ICD-10-CM

## 2023-08-27 DIAGNOSIS — Z821 Family history of blindness and visual loss: Secondary | ICD-10-CM

## 2023-08-27 DIAGNOSIS — I160 Hypertensive urgency: Secondary | ICD-10-CM | POA: Diagnosis not present

## 2023-08-27 DIAGNOSIS — I10 Essential (primary) hypertension: Secondary | ICD-10-CM | POA: Diagnosis not present

## 2023-08-27 DIAGNOSIS — I2489 Other forms of acute ischemic heart disease: Secondary | ICD-10-CM | POA: Diagnosis not present

## 2023-08-27 DIAGNOSIS — I472 Ventricular tachycardia, unspecified: Secondary | ICD-10-CM | POA: Diagnosis not present

## 2023-08-27 DIAGNOSIS — Z818 Family history of other mental and behavioral disorders: Secondary | ICD-10-CM

## 2023-08-27 DIAGNOSIS — I700 Atherosclerosis of aorta: Secondary | ICD-10-CM | POA: Diagnosis not present

## 2023-08-27 DIAGNOSIS — N179 Acute kidney failure, unspecified: Secondary | ICD-10-CM | POA: Diagnosis present

## 2023-08-27 DIAGNOSIS — R0989 Other specified symptoms and signs involving the circulatory and respiratory systems: Secondary | ICD-10-CM | POA: Diagnosis not present

## 2023-08-27 DIAGNOSIS — E669 Obesity, unspecified: Secondary | ICD-10-CM | POA: Diagnosis present

## 2023-08-27 DIAGNOSIS — Z888 Allergy status to other drugs, medicaments and biological substances status: Secondary | ICD-10-CM

## 2023-08-27 DIAGNOSIS — I5020 Unspecified systolic (congestive) heart failure: Secondary | ICD-10-CM

## 2023-08-27 DIAGNOSIS — F419 Anxiety disorder, unspecified: Secondary | ICD-10-CM | POA: Diagnosis present

## 2023-08-27 DIAGNOSIS — I480 Paroxysmal atrial fibrillation: Secondary | ICD-10-CM

## 2023-08-27 DIAGNOSIS — Z5986 Financial insecurity: Secondary | ICD-10-CM

## 2023-08-27 DIAGNOSIS — I509 Heart failure, unspecified: Secondary | ICD-10-CM | POA: Diagnosis not present

## 2023-08-27 DIAGNOSIS — G4733 Obstructive sleep apnea (adult) (pediatric): Secondary | ICD-10-CM | POA: Diagnosis present

## 2023-08-27 DIAGNOSIS — J441 Chronic obstructive pulmonary disease with (acute) exacerbation: Secondary | ICD-10-CM | POA: Diagnosis not present

## 2023-08-27 DIAGNOSIS — R079 Chest pain, unspecified: Secondary | ICD-10-CM | POA: Diagnosis not present

## 2023-08-27 DIAGNOSIS — Z8672 Personal history of thrombophlebitis: Secondary | ICD-10-CM

## 2023-08-27 DIAGNOSIS — E785 Hyperlipidemia, unspecified: Secondary | ICD-10-CM | POA: Diagnosis not present

## 2023-08-27 DIAGNOSIS — E876 Hypokalemia: Secondary | ICD-10-CM | POA: Diagnosis present

## 2023-08-27 DIAGNOSIS — I252 Old myocardial infarction: Secondary | ICD-10-CM

## 2023-08-27 DIAGNOSIS — Z882 Allergy status to sulfonamides status: Secondary | ICD-10-CM

## 2023-08-27 LAB — BASIC METABOLIC PANEL WITH GFR
Anion gap: 13 (ref 5–15)
BUN: 12 mg/dL (ref 8–23)
CO2: 24 mmol/L (ref 22–32)
Calcium: 9.5 mg/dL (ref 8.9–10.3)
Chloride: 101 mmol/L (ref 98–111)
Creatinine, Ser: 0.79 mg/dL (ref 0.44–1.00)
GFR, Estimated: >60 mL/min (ref >60)
Glucose, Bld: 120 mg/dL — ABNORMAL HIGH (ref 70–99)
Potassium: 3.5 mmol/L (ref 3.5–5.1)
Sodium: 139 mmol/L (ref 135–145)

## 2023-08-27 LAB — CBC
HCT: 34.3 % — ABNORMAL LOW (ref 36.0–46.0)
Hemoglobin: 10.5 g/dL — ABNORMAL LOW (ref 12.0–15.0)
MCH: 28 pg (ref 26.0–34.0)
MCHC: 30.6 g/dL (ref 30.0–36.0)
MCV: 91.5 fL (ref 80.0–100.0)
Platelets: 347 K/uL (ref 150–400)
RBC: 3.75 MIL/uL — ABNORMAL LOW (ref 3.87–5.11)
RDW: 17.1 % — ABNORMAL HIGH (ref 11.5–15.5)
WBC: 9.2 K/uL (ref 4.0–10.5)
nRBC: 0 % (ref 0.0–0.2)

## 2023-08-27 LAB — TROPONIN T, HIGH SENSITIVITY
Troponin T High Sensitivity: 26 ng/L — ABNORMAL HIGH (ref 0–19)
Troponin T High Sensitivity: 24 ng/L — ABNORMAL HIGH (ref 0–19)

## 2023-08-27 LAB — PRO BRAIN NATRIURETIC PEPTIDE: Pro Brain Natriuretic Peptide: 13668 pg/mL — ABNORMAL HIGH (ref <300.0)

## 2023-08-27 MED ORDER — FUROSEMIDE 10 MG/ML IJ SOLN
40.0000 mg | Freq: Two times a day (BID) | INTRAMUSCULAR | Status: DC
  Administered 2023-08-28: 40 mg via INTRAVENOUS
  Filled 2023-08-27: qty 4

## 2023-08-27 MED ORDER — EMPAGLIFLOZIN 10 MG PO TABS
10.0000 mg | ORAL_TABLET | Freq: Every day | ORAL | Status: DC
  Administered 2023-08-28 – 2023-08-31 (×4): 10 mg via ORAL
  Filled 2023-08-27 (×4): qty 1

## 2023-08-27 MED ORDER — HYDRALAZINE HCL 20 MG/ML IJ SOLN
10.0000 mg | Freq: Once | INTRAMUSCULAR | Status: DC
  Filled 2023-08-27: qty 1

## 2023-08-27 MED ORDER — ACETAMINOPHEN 325 MG PO TABS
650.0000 mg | ORAL_TABLET | Freq: Once | ORAL | Status: AC
  Administered 2023-08-27: 650 mg via ORAL
  Filled 2023-08-27: qty 2

## 2023-08-27 MED ORDER — APIXABAN 5 MG PO TABS
5.0000 mg | ORAL_TABLET | Freq: Two times a day (BID) | ORAL | Status: DC
  Administered 2023-08-28 – 2023-08-31 (×8): 5 mg via ORAL
  Filled 2023-08-27 (×8): qty 1

## 2023-08-27 MED ORDER — ADULT MULTIVITAMIN W/MINERALS CH
1.0000 | ORAL_TABLET | Freq: Every day | ORAL | Status: DC
  Administered 2023-08-28 – 2023-08-30 (×4): 1 via ORAL
  Filled 2023-08-27 (×4): qty 1

## 2023-08-27 MED ORDER — ENOXAPARIN SODIUM 40 MG/0.4ML IJ SOSY: 40.0000 mg | PREFILLED_SYRINGE | INTRAMUSCULAR | Status: DC

## 2023-08-27 MED ORDER — ONDANSETRON HCL 4 MG/2ML IJ SOLN: 4.0000 mg | Freq: Four times a day (QID) | INTRAMUSCULAR | Status: DC | PRN

## 2023-08-27 MED ORDER — METOPROLOL TARTRATE 5 MG/5ML IV SOLN: 5.0000 mg | Freq: Once | INTRAVENOUS | Status: DC

## 2023-08-27 MED ORDER — BUSPIRONE HCL 5 MG PO TABS
5.0000 mg | ORAL_TABLET | Freq: Two times a day (BID) | ORAL | Status: DC
  Administered 2023-08-28 – 2023-08-31 (×7): 5 mg via ORAL
  Filled 2023-08-27 (×7): qty 1

## 2023-08-27 MED ORDER — LEVALBUTEROL HCL 0.63 MG/3ML IN NEBU: 0.6300 mg | INHALATION_SOLUTION | RESPIRATORY_TRACT | Status: DC | PRN

## 2023-08-27 MED ORDER — SIMVASTATIN 20 MG PO TABS
20.0000 mg | ORAL_TABLET | Freq: Every day | ORAL | Status: DC
  Administered 2023-08-28 – 2023-08-31 (×4): 20 mg via ORAL
  Filled 2023-08-27 (×4): qty 1

## 2023-08-27 MED ORDER — NITROGLYCERIN IN D5W 200-5 MCG/ML-% IV SOLN
0.0000 ug/min | INTRAVENOUS | Status: DC
  Administered 2023-08-28: 110 ug/min via INTRAVENOUS
  Filled 2023-08-27: qty 250

## 2023-08-27 MED ORDER — ACETAMINOPHEN 325 MG PO TABS
650.0000 mg | ORAL_TABLET | Freq: Four times a day (QID) | ORAL | Status: DC | PRN
  Administered 2023-08-28 – 2023-08-29 (×3): 650 mg via ORAL
  Filled 2023-08-27 (×3): qty 2

## 2023-08-27 MED ORDER — APIXABAN 5 MG PO TABS: 5.0000 mg | ORAL_TABLET | Freq: Two times a day (BID) | ORAL | Status: DC

## 2023-08-27 MED ORDER — ONDANSETRON HCL 4 MG PO TABS: 4.0000 mg | ORAL_TABLET | Freq: Four times a day (QID) | ORAL | Status: DC | PRN

## 2023-08-27 MED ORDER — METOPROLOL SUCCINATE ER 100 MG PO TB24
100.0000 mg | ORAL_TABLET | Freq: Every day | ORAL | Status: DC
  Administered 2023-08-28 – 2023-08-31 (×4): 100 mg via ORAL
  Filled 2023-08-27 (×4): qty 1

## 2023-08-27 MED ORDER — HYDRALAZINE HCL 20 MG/ML IJ SOLN
5.0000 mg | Freq: Once | INTRAMUSCULAR | Status: AC
  Administered 2023-08-27: 5 mg via INTRAVENOUS

## 2023-08-27 MED ORDER — IRBESARTAN 300 MG PO TABS
150.0000 mg | ORAL_TABLET | Freq: Every day | ORAL | Status: DC
  Administered 2023-08-28 – 2023-08-29 (×2): 150 mg via ORAL
  Filled 2023-08-27 (×2): qty 1

## 2023-08-27 MED ORDER — VITAMIN D 25 MCG (1000 UNIT) PO TABS
2000.0000 [IU] | ORAL_TABLET | Freq: Every day | ORAL | Status: DC
  Administered 2023-08-28 – 2023-08-31 (×4): 2000 [IU] via ORAL
  Filled 2023-08-27 (×4): qty 2

## 2023-08-27 MED ORDER — HYDRALAZINE HCL 20 MG/ML IJ SOLN: 5.0000 mg | Freq: Once | INTRAMUSCULAR | Status: DC

## 2023-08-27 MED ORDER — FLUTICASONE FUROATE-VILANTEROL 100-25 MCG/ACT IN AEPB
1.0000 | INHALATION_SPRAY | Freq: Every day | RESPIRATORY_TRACT | Status: DC
  Filled 2023-08-27: qty 28

## 2023-08-27 MED ORDER — NITROGLYCERIN IN D5W 200-5 MCG/ML-% IV SOLN
0.0000 ug/min | INTRAVENOUS | Status: DC
  Administered 2023-08-27: 5 ug/min via INTRAVENOUS
  Administered 2023-08-27: 15 ug/min via INTRAVENOUS
  Administered 2023-08-27: 10 ug/min via INTRAVENOUS
  Filled 2023-08-27: qty 250

## 2023-08-27 MED ORDER — ACETAMINOPHEN 650 MG RE SUPP
650.0000 mg | Freq: Four times a day (QID) | RECTAL | Status: DC | PRN
Start: 2023-08-27 — End: 2023-08-31

## 2023-08-27 NOTE — H&P (Incomplete)
 History and Physical    Shannon [name] FMW:[id number] DOB: September 09, 1950 DOA: 08/27/2023  I have briefly reviewed the patient's prior medical records in [facility]  PCP: Knute [name], FNP  Patient coming from: home  Chief Complaint: Shortness of breath  HPI: Shannon [name] is a 73 y.o. female with medical history significant of chronic systolic CHF, PAF previously ablated with multiple cardioversions, with multiple medication intolerances, comes into the hospital with progressive shortness of breath.  This has been going on for the last few days, but more so over the last few weeks.  She difficulties laying flat.  She denies any weight gain, however states that she has lost a lot of weight in the last few months.  She denies any fever or chills, no chest pain, no palpitations.  ED Course: In the ED she was found to be initially hypertensive with blood pressure as high as 181/92, A-fib with RVR with rates into the 130s.  Blood work shows a proBNP of 13,000, slight elevation of high-sensitivity troponin 26, 24, normal renal function.  A chest x-ray showed cardiomegaly and vascular congestion.  She was placed on nitroglycerin , cardiology was consulted and she was admitted to the hospital  Review of Systems: All systems reviewed, and apart from HPI, all negative  Past Medical History:  Diagnosis Date  . Acute systolic heart failure (HCC) 07/08/2023  . Allergy   . Anemia   . Anxiety   . Arthritis    neck and lower back (03/25/2016)  . Asthma 1990s X 1   short term inhaler use   . CAD (coronary artery disease)   . Cardiac pacemaker in situ    MDT  . CHF (congestive heart failure) (HCC)   . Chronic lower back pain   . COVID-19 03/06/2022  . Degenerative disorder of bone   . Depression   . Diastolic dysfunction   . Drug-induced lupus erythematosus    HCTZ induced; still gettin over it (03/25/2016)  . Foot swelling 05/19/2022  . GERD (gastroesophageal reflux  disease)   . Headache, unspecified headache type 03/06/2022  . Herniated disc, cervical   . Hospital discharge follow-up 03/14/2022  . Hyperlipidemia   . Hypertension   . Neuromuscular disorder (HCC)    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 HTN  . Obesity (BMI 30-39.9) 11/07/2022  . Orthostatic hypotension   . OSA on CPAP   . Osteopenia   . Osteoporosis 2012  . PAF (paroxysmal atrial fibrillation) (HCC)   . Paroxysmal atrial fibrillation (HCC) 12/13/2021  . Phlebitis after infusion 03/18/2023  . Pinched nerve in neck   . PND (post-nasal drip) 12/18/2022  . Rapid atrial fibrillation (HCC) 07/06/2023  . Sinus node dysfunction (HCC)   . Sinus pressure 12/18/2022  . Sleep apnea    wears CPAP  . Stroke (HCC)   . T12 compression fracture (HCC) 11/2015  . Vitamin D  deficiency   . Whiplash injury 06/07/2010    Past Surgical History:  Procedure Laterality Date  . APPENDECTOMY  1990s  . ATRIAL FIBRILLATION ABLATION N/A 03/25/2016   Procedure: Atrial Fibrillation Ablation;  Surgeon: Lynwood Rakers, MD;  Location[facility] INVASIVE CV LAB;  Service: Cardiovascular;  Laterality: N/A;  . ATRIAL FIBRILLATION ABLATION N/A 01/31/2020   Procedure: ATRIAL FIBRILLATION ABLATION;  Surgeon: Rakers Lynwood, MD;  Location: [facility] INVASIVE CV LAB;  Service: Cardiovascular;  Laterality: N/A;  . ATRIAL FIBRILLATION ABLATION N/A 12/13/2021   Procedure: ATRIAL FIBRILLATION ABLATION;  Surgeon: Cindie,  Ole DASEN, MD;  Location: [facility] INVASIVE CV LAB;  Service: Cardiovascular;  Laterality: N/A;  . CARDIOVERSION N/A 07/09/2023   Procedure: CARDIOVERSION;  Surgeon: Okey Vina GAILS, MD;  Location: [facility] INVASIVE CV LAB;  Service: Cardiovascular;  Laterality: N/A;  . COLONOSCOPY    . FOREARM FRACTURE SURGERY Left ~ 02/2011   broke arm; shattered wrist  . FOREARM HARDWARE REMOVAL Left ~ 07/2011  . implantable loop recorder placement  03/07/2019   Medtronic Reveal Linq model LNQ 22 implantable loop recorder  ([id number]) implanted by Dr Kelsie for Afib management  . INSERT / REPLACE / REMOVE PACEMAKER  2025  . LOOP RECORDER REMOVAL N/A 03/09/2023   Procedure: LOOP RECORDER REMOVAL;  Surgeon: Kennyth Chew, MD;  Location: [facility] INVASIVE CV LAB;  Service: Cardiovascular;  Laterality: N/A;  . PACEMAKER IMPLANT N/A 03/09/2023   Procedure: PACEMAKER IMPLANT;  Surgeon: Kennyth Chew, MD;  Location: [facility] INVASIVE CV LAB;  Service: Cardiovascular;  Laterality: N/A;  . Spinal Nerve Ablation    . TEE WITHOUT CARDIOVERSION N/A 03/24/2016   Procedure: TRANSESOPHAGEAL ECHOCARDIOGRAM (TEE);  Surgeon: Jerel Balding, MD;  Location: [facility] ENDOSCOPY;  Service: Cardiovascular;  Laterality: N/A;     reports that she quit smoking about 9 years ago. Her smoking use included cigarettes and e-cigarettes. She started smoking about 48 years ago. She has a 22 pack-year smoking history. She has never used smokeless tobacco. She reports that she does not currently use alcohol. She reports that she does not use drugs.  Allergies  Allergen Reactions  . Definity  [Perflutren  Lipid Microsphere] Other (See Comments)    Had unresponsive episode with concerns on allergic reaction. AVOID.  SABRA Ivp Dye [Iodinated Contrast Media] Shortness Of Breath  . Sulfur Nausea And Vomiting  . Ace Inhibitors Cough  . Amiodarone  Other (See Comments)    Intolerance multiple side effects  . Hctz [Hydrochlorothiazide ] Other (See Comments)    Caused drug-induced LUPUS  . Oxycodone Other (See Comments)    Hallucinations  . Prednisone  Other (See Comments)    Made patient very aggressive  . Sulfa Antibiotics Nausea And Vomiting  . Voltaren [Diclofenac Sodium] Other (See Comments)    Made patient become aggressive    Family History  Problem Relation Age of Onset  . Lung cancer Mother   . Cancer Mother   . Early death Mother   . Miscarriages / [place] Mother   . Vision loss Mother   . Stroke Father   . Hypertension Father   . Heart disease  Father   . Hypertension Maternal Grandmother   . Stroke Maternal Grandfather   . Heart disease Maternal Grandfather   . Vision loss Maternal Grandfather   . Diabetes Paternal Grandmother   . Heart disease Paternal Grandmother   . Diabetes Paternal Grandfather   . Hearing loss Paternal Grandfather   . Vision loss Paternal Grandfather   . Bipolar disorder Daughter   . Other Daughter        fatty liver  . Other Son        fattye liver, born with 1 kidney  . ADD / ADHD Daughter   . Alcohol abuse Daughter   . Depression Daughter   . Hypertension Daughter   . Learning disabilities Daughter   . Miscarriages / [place] Daughter   . Obesity Daughter   . Vision loss Daughter   . ADD / ADHD Son   . Birth defects Son   . Diabetes Son   . Hearing loss Son   .  Learning disabilities Son   . Obesity Son   . Vision loss Son   . Heart disease Brother   . Hypertension Brother   . Heart disease Sister   . Hypertension Sister   . Vision loss Sister   . Obesity Sister   . Colon cancer Neg Hx   . Esophageal cancer Neg Hx   . Rectal cancer Neg Hx   . Stomach cancer Neg Hx     Prior to Admission medications   Medication Sig Start Date End Date Taking? Authorizing Provider  acetaminophen  (TYLENOL ) 500 MG tablet Take 1,000 mg by mouth every 6 (six) hours as needed for moderate pain or headache.    [provider]  benzonatate  (TESSALON ) 100 MG capsule Take 1 capsule (100 mg total) by mouth 3 (three) times daily as needed for cough. 06/30/23   [name], PA-C  budesonide -formoterol  (SYMBICORT ) 80-4.5 MCG/ACT inhaler Inhale 2 puffs into the lungs 2 (two) times daily. 07/13/23   [name], [name], FNP  busPIRone  (BUSPAR ) 5 MG tablet Take 1 tablet (5 mg total) by mouth 2 (two) times daily. 06/25/23   [name], NP  Cholecalciferol  (VITAMIN D ) 50 MCG (2000 UT) CAPS Take 2,000 Units by mouth in the morning.    [provider]  ELIQUIS  5 MG TABS tablet TAKE 1 TABLET  BY MOUTH TWICE  DAILY 06/23/23   [name], MD  empagliflozin  (JARDIANCE ) 10 MG TABS tablet Take 1 tablet (10 mg total) by mouth daily before breakfast. 07/21/23   Darryle Thom CROME, PA-C  furosemide  (LASIX ) 20 MG tablet Take 20 mg by mouth.    [provider]  Hydrocortisone (CORTIZONE-10 EX) Apply 1 application  topically as needed (skin irritation/itching).    [provider]  levalbuterol  (XOPENEX  HFA) 45 MCG/ACT inhaler Inhale 2 puffs into the lungs every 4 (four) hours as needed for wheezing. 06/16/23   [name], NP  loratadine (CLARITIN) 10 MG tablet Take 10 mg by mouth daily as needed for allergies.    [provider]  metoprolol  succinate (TOPROL -XL) 100 MG 24 hr tablet Take 1 tablet (100 mg total) by mouth 2 (two) times daily. Take with or immediately following a meal. 08/03/23   [name], MD  Multiple Vitamin (MULTIVITAMIN) tablet Take 1 tablet by mouth at bedtime.    [provider]  olmesartan  (BENICAR ) 20 MG tablet Take 1 tablet (20 mg total) by mouth daily. 08/20/23   Darryle Thom CROME, PA-C  simvastatin  (ZOCOR ) 20 MG tablet TAKE 1 TABLET BY MOUTH DAILY 05/14/23   [name], NP    Physical Exam: Vitals:   08/27/23 2300 08/27/23 2301 08/27/23 2302 08/27/23 2324  BP: (!) 159/111 (!) 144/94  (!) 147/106  Pulse: (!) 129  (!) 131 (!) 119  Resp: 20 (!) 22 (!) 24 (!) 25  Temp: 97.9 F (36.6 C)   97.9 F (36.6 C)  TempSrc: Oral   Oral  SpO2: 99%  99% 98%  Weight:      Height:       Constitutional: NAD, calm, comfortable Eyes: PERRL, lids and conjunctivae normal ENMT: Mucous membranes are moist.  Neck: normal, supple Respiratory: Fair bibasilar crackles, no wheezing Cardiovascular: Regular rate and rhythm, no murmurs / rubs / gallops.  Edema, elevated JVD Abdomen: no tenderness, no masses palpated. Bowel sounds positive.  Musculoskeletal: no clubbing / cyanosis. Normal muscle tone.  Skin: no rashes, lesions, ulcers. No  induration Neurologic: Nonfocal, equal  strength  Labs on Admission: I have personally reviewed following labs and imaging studies  CBC: Recent Labs  Lab 08/27/23 1844  WBC 9.2  HGB 10.5*  HCT 34.3*  MCV 91.5  PLT 347   Basic Metabolic Panel: Recent Labs  Lab 08/27/23 1844  NA 139  K 3.5  CL 101  CO2 24  GLUCOSE 120*  BUN 12  CREATININE 0.79  CALCIUM  9.5   Liver Function Tests: No results for input(s): AST, ALT, ALKPHOS, BILITOT, PROT, ALBUMIN in the last 168 hours. Coagulation Profile: No results for input(s): INR, PROTIME in the last 168 hours. BNP (last 3 results) Recent Labs    07/01/23 1610 08/27/23 1858  PROBNP 3,772.0* 13,668.0*   CBG: No results for input(s): GLUCAP in the last 168 hours. Thyroid  Function Tests: No results for input(s): TSH, T4TOTAL, FREET4, T3FREE, THYROIDAB in the last 72 hours. Urine analysis:    Component Value Date/Time   COLORURINE COLORLESS (A) 02/28/2022 2019   APPEARANCEUR CLEAR 02/28/2022 2019   LABSPEC 1.006 02/28/2022 2019   PHURINE 5.5 02/28/2022 2019   GLUCOSEU NEGATIVE 02/28/2022 2019   HGBUR NEGATIVE 02/28/2022 2019   BILIRUBINUR NEGATIVE 02/28/2022 2019   BILIRUBINUR negative 03/29/2021 1126   KETONESUR NEGATIVE 02/28/2022 2019   PROTEINUR NEGATIVE 02/28/2022 2019   UROBILINOGEN 0.2 03/29/2021 1126   NITRITE NEGATIVE 02/28/2022 2019   LEUKOCYTESUR NEGATIVE 02/28/2022 2019     Radiological Exams on Admission: DG Chest Port 1 View Result Date: 08/27/2023 CLINICAL DATA:  Chest pain, hypertension EXAM: PORTABLE CHEST 1 VIEW COMPARISON:  07/30/2023 FINDINGS: Left pacer remains in place, unchanged. Mild cardiomegaly. Aortic atherosclerosis. No confluent airspace opacity, effusion or edema. Mild vascular congestion. IMPRESSION: Cardiomegaly, vascular congestion. Electronically Signed   By: Franky Crease M.D.   On: 08/27/2023 19:10    EKG: Independently reviewed.  A-fib with  RVR  Assessment/Plan Principal problem Acute on chronic systolic CHF -most recent 2D echocardiogram was done in July 2025 shows LVEF 30-35%, global hypokinesis, normal RV - Cardiology consulted, appreciate input.  She will be given furosemide  tonight, reevaluate in the morning  Active problems   DVT prophylaxis: ***  Code Status: ***  Family Communication: *** Disposition Plan: *** Bed Type: *** Consults called: ***  Obs/Inp: ***  At the time of admission, it appears that the appropriate admission status for this patient is INPATIENT as it is expected that patient will require hospital care > 2 midnights. This is judged to be reasonable and necessary in order to provide the required intensity of service to ensure the patient's safety given: presenting symptoms, initial radiographic and laboratory data and in the context of their chronic comorbidities. Together, these circumstances are felt to place patient at high at high risk for further clinical deterioration threatening life, limb, or organ.  Nilda Fendt, MD, PhD [facility]  Contact via [email or web address]  08/27/2023, 11:57 PM

## 2023-08-27 NOTE — ED Triage Notes (Signed)
 High BP and sob x months Worse today  BP high weakness today New BP med (olmesartan  started Saturday) Breathing worse when BP is high per family (anxiety) Some chest pain Hx chf

## 2023-08-27 NOTE — Progress Notes (Signed)
 [facility] by [facility] team. She is alert and fully oriented x 4, afebrile, on 2 LPM of O2 NCL. No SOB. Pt has complaints of chest pressure and tightness. At arrival, she is on continue gtt of NTG 55 mcg/min (16.5 ml/hr). Mew Mew score is in red/yellow due to hight HR, Atrial fib with RVR on the monitor. A cardiologist and a hospitalist providers are at bedside. We will continue to monitor.    08/27/23 2300  Vitals  Temp 97.9 F (36.6 C)  Temp Source Oral  BP (!) 159/111  MAP (mmHg) 122  BP Location Left Arm  BP Method Automatic  Patient Position (if appropriate) Lying  Pulse Rate (!) 130  Pulse Rate Source Monitor  ECG Heart Rate (!) 130  Resp 20  Level of Consciousness  Level of Consciousness Alert  MEWS COLOR  MEWS Score Color Yellow  Oxygen Therapy  SpO2 99 %  O2 Device 2 LPM of O2 NCL  Pain Assessment  Pain Scale 0-10  Pain Score 3  Pain Type Acute pain  Pain Location Chest  Pain Orientation Mid  Pain Descriptors / Indicators Heaviness;Pressure  Pain Frequency Intermittent  Pain Onset Gradual  Pain Intervention(s) MD notified (Comment);Medication (See eMAR);Elevated extremity;Emotional support;Relaxation;Rest     Wendi dash, RN
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
## 2023-08-27 NOTE — ED Provider Notes (Signed)
 [place] EMERGENCY DEPARTMENT AT [facility] Provider Note   CSN: [id number] Arrival date & time: 08/27/23  8187     Patient presents with: Shortness of Breath and Chest Pain   Shannon [name] is a 73 y.o. female.   Patient is a 73 year old female with past medical history of hypertension, OSA with CPAP, HLD, sick sinus syndrome with pacemaker, orthostatic hypotension, persistent atrial fibrillation, CVA, drug-induced lupus, heart failure with reduced ejection fraction presenting for complaints of shortness of breath.  Of breath over the last several months after the occurrence of a upper respiratory infection in June 2025.  Denies any edema or weight gain.  She denies any fevers, chills, coughing.  She states her shortness of breath is associated with chest tightness and elevated blood pressure readings.  Current blood pressure on arrival 182/92.  She states they called EMS to the house yesterday and when she had a blood pressure of 210 systolic.  Patient states today placed her on therapeutic oxygen which lowered her blood pressure.  Patient also attributes her shortness of breath due to recent blood pressure medication changes by her cardiology team and attempt to optimize her blood pressure.  Chart review demonstrates: 08/20/2023 cardiology office visit summary of medication changes She stopped Entresto  due to hypotension although I feel this was an isolated reading.  Her blood pressure log demonstrated blood pressures 150-170 on Entresto .  Starting olmesartan  20 mg.  Get BMP 2 weeks from now. She stopped spironolactone  12.5 mg due to shortness of breath.  After ablation when things settle she would be willing to retrying some of these. Continue Jardiance  10 mg, Toprol -XL 100 mg twice daily, Lasix  20 mg.  She is also getting potassium supplementation 20 mEq daily.          Shortness of Breath Associated symptoms: chest pain   Associated symptoms: no abdominal pain,  no cough, no ear pain, no fever, no rash, no sore throat and no vomiting   Chest Pain Associated symptoms: shortness of breath   Associated symptoms: no abdominal pain, no back pain, no cough, no fever, no palpitations and no vomiting        Prior to Admission medications   Medication Sig Start Date End Date Taking? Authorizing Provider  acetaminophen  (TYLENOL ) 500 MG tablet Take 1,000 mg by mouth every 6 (six) hours as needed for moderate pain or headache.   Yes [provider]  busPIRone  (BUSPAR ) 5 MG tablet Take 1 tablet (5 mg total) by mouth 2 (two) times daily. 06/25/23  Yes [name], NP  Cholecalciferol  (VITAMIN D ) 50 MCG (2000 UT) CAPS Take 2,000 Units by mouth in the morning.   Yes [provider]  ELIQUIS  5 MG TABS tablet TAKE 1 TABLET BY MOUTH TWICE  DAILY 06/23/23  Yes [name], MD  empagliflozin  (JARDIANCE ) 10 MG TABS tablet Take 1 tablet (10 mg total) by mouth daily before breakfast. 07/21/23  Yes Darryle Currier L, PA-C  Hydrocortisone (CORTIZONE-10 EX) Apply 1 application  topically as needed (skin irritation/itching).   Yes [provider]  levalbuterol  (XOPENEX  HFA) 45 MCG/ACT inhaler Inhale 2 puffs into the lungs every 4 (four) hours as needed for wheezing. 06/16/23  Yes [name], NP  Multiple Vitamin (MULTIVITAMIN) tablet Take 1 tablet by mouth at bedtime.   Yes [provider]  simvastatin  (ZOCOR ) 20 MG tablet TAKE 1 TABLET BY MOUTH DAILY 05/14/23  Yes [name], NP  furosemide  (LASIX ) 20 MG tablet  Take 2 tablets (40 mg total) by mouth daily. In case of weight gain 2 to 3 lbs in 24 hrs or 4 lbs in 7 days take 40 mg twice daily until weight back to baseline 09/01/23   Arrien, Elidia Sieving, MD  metoprolol  succinate (TOPROL -XL) 100 MG 24 hr tablet Take 1 tablet (100 mg total) by mouth daily. Take with or immediately following a meal. 08/31/23   Arrien, Elidia Sieving, MD  olmesartan  (BENICAR ) 20 MG tablet Take 2 tablets (40  mg total) by mouth daily. 08/31/23   Arrien, Mauricio Daniel, MD  potassium chloride  SA (KLOR-CON  M) 20 MEQ tablet Take 1 tablet (20 mEq total) by mouth daily. Take with furosemide . 08/31/23   Arrien, Elidia Sieving, MD    Allergies: Definity  [perflutren  lipid microsphere], Ivp dye [iodinated contrast media], Spironolactone , Sulfur, Ace inhibitors, Amiodarone , Entresto  [sacubitril -valsartan ], Hctz [hydrochlorothiazide ], Oxycodone, Prednisone , Sulfa antibiotics, Symbicort  [budesonide -formoterol  fumarate], and Voltaren [diclofenac sodium]    Review of Systems  Constitutional:  Negative for chills and fever.  HENT:  Negative for ear pain and sore throat.   Eyes:  Negative for pain and visual disturbance.  Respiratory:  Positive for shortness of breath. Negative for cough.   Cardiovascular:  Positive for chest pain. Negative for palpitations.  Gastrointestinal:  Negative for abdominal pain and vomiting.  Genitourinary:  Negative for dysuria and hematuria.  Musculoskeletal:  Negative for arthralgias and back pain.  Skin:  Negative for color change and rash.  Neurological:  Negative for seizures and syncope.  All other systems reviewed and are negative.   Updated Vital Signs BP 109/67 (BP Location: Left Arm)   Pulse 70   Temp 97.6 F (36.4 C) (Oral)   Resp 16   Ht 5' 7 (1.702 m)   Wt 81 kg   SpO2 92%   BMI 27.97 kg/m   Physical Exam Vitals and nursing note reviewed.  Constitutional:      General: She is not in acute distress.    Appearance: She is well-developed.  HENT:     Head: Normocephalic and atraumatic.  Eyes:     Conjunctiva/sclera: Conjunctivae normal.  Cardiovascular:     Rate and Rhythm: Normal rate and regular rhythm.     Heart sounds: No murmur heard. Pulmonary:     Effort: Pulmonary effort is normal. No respiratory distress.     Breath sounds: Normal breath sounds.  Abdominal:     Palpations: Abdomen is soft.     Tenderness: There is no abdominal tenderness.   Musculoskeletal:        General: No swelling.     Cervical back: Neck supple.     Right lower leg: 1+ Edema present.     Left lower leg: 1+ Edema present.  Skin:    General: Skin is warm and dry.     Capillary Refill: Capillary refill takes less than 2 seconds.  Neurological:     Mental Status: She is alert.  Psychiatric:        Mood and Affect: Mood normal.     (all labs ordered are listed, but only abnormal results are displayed) Labs Reviewed  BASIC METABOLIC PANEL WITH GFR - Abnormal; Notable for the following components:      Result Value   Glucose, Bld 120 (*)    All other components within normal limits  CBC - Abnormal; Notable for the following components:   RBC 3.75 (*)    Hemoglobin 10.5 (*)    HCT 34.3 (*)  RDW 17.1 (*)    All other components within normal limits  PRO BRAIN NATRIURETIC PEPTIDE - Abnormal; Notable for the following components:   Pro Brain Natriuretic Peptide 13,668.0 (*)    All other components within normal limits  COMPREHENSIVE METABOLIC PANEL WITH GFR - Abnormal; Notable for the following components:   Potassium 3.4 (*)    Glucose, Bld 110 (*)    BUN 7 (*)    Calcium  8.8 (*)    Total Protein 6.4 (*)    Albumin 3.0 (*)    Total Bilirubin 1.4 (*)    All other components within normal limits  CBC - Abnormal; Notable for the following components:   RBC 3.56 (*)    Hemoglobin 10.1 (*)    HCT 33.2 (*)    RDW 16.9 (*)    All other components within normal limits  BASIC METABOLIC PANEL WITH GFR - Abnormal; Notable for the following components:   Glucose, Bld 106 (*)    All other components within normal limits  BRAIN NATRIURETIC PEPTIDE - Abnormal; Notable for the following components:   B Natriuretic Peptide 491.8 (*)    All other components within normal limits  BASIC METABOLIC PANEL WITH GFR - Abnormal; Notable for the following components:   Potassium 2.9 (*)    Chloride 94 (*)    All other components within normal limits  BRAIN  NATRIURETIC PEPTIDE - Abnormal; Notable for the following components:   B Natriuretic Peptide 127.8 (*)    All other components within normal limits  BASIC METABOLIC PANEL WITH GFR - Abnormal; Notable for the following components:   Sodium 134 (*)    Chloride 89 (*)    CO2 35 (*)    Creatinine, Ser 1.29 (*)    GFR, Estimated 44 (*)    All other components within normal limits  TROPONIN T, HIGH SENSITIVITY - Abnormal; Notable for the following components:   Troponin T High Sensitivity 26 (*)    All other components within normal limits  TROPONIN T, HIGH SENSITIVITY - Abnormal; Notable for the following components:   Troponin T High Sensitivity 24 (*)    All other components within normal limits  MAGNESIUM   MAGNESIUM   MAGNESIUM     EKG: EKG Interpretation Date/Time:  Thursday August 27 2023 19:07:19 EDT Ventricular Rate:  63 PR Interval:  161 QRS Duration:  96 QT Interval:  464 QTC Calculation: 475 R Axis:   46  Text Interpretation: Atrial fibrillation Anterior infarct, age indeterminate Confirmed by Kommor, [place] [id number]) on 08/28/2023 6:21:49 PM  Radiology: No results found.   Procedures   Medications Ordered in the ED  hydrALAZINE  (APRESOLINE ) injection 5 mg (5 mg Intravenous Given 08/27/23 1907)  acetaminophen  (TYLENOL ) tablet 650 mg (650 mg Oral Given 08/27/23 2146)  potassium chloride  SA (KLOR-CON  M) CR tablet 40 mEq (40 mEq Oral Given 08/28/23 1200)  potassium chloride  SA (KLOR-CON  M) CR tablet 40 mEq (40 mEq Oral Given 08/29/23 0843)  metolazone  (ZAROXOLYN ) tablet 2.5 mg (2.5 mg Oral Given 08/29/23 1039)  potassium chloride  SA (KLOR-CON  M) CR tablet 40 mEq (40 mEq Oral Given 08/30/23 0620)  potassium chloride  SA (KLOR-CON  M) CR tablet 40 mEq (40 mEq Oral Given 08/30/23 1346)  furosemide  (LASIX ) injection 80 mg (80 mg Intravenous Given 08/31/23 0913)                                    Medical  Decision Making Amount and/or Complexity of Data Reviewed Labs:  ordered. Radiology: ordered.  Risk OTC drugs. Prescription drug management. Decision regarding hospitalization.   73 year old female with past medical history of hypertension, OSA with CPAP, HLD, orthostatic hypotension, A-fib, CVA, drug-induced lupus, heart failure with reduced ejection fraction presenting for complaints of shortness of breath.    Patient is alert and oriented x 3, no acute distress, afebrile, hypertensive at 182/92 with otherwise stable vital signs.  Oxygenation is 94-96 on room air while resting in the bed.  Patient has equal bilateral breath sounds with no adventitious lung sounds.  Twelve-lead EKG demonstrates atrial fibrillation with a rate of 63 bpm.  No ST segment elevation or depression.  Laboratory studies concerning for acute heart failure exacerbation.  On exam she has +1 nonpitting edema lower extremities.  No rales or crackles.  Symptoms of shortness of breath but no hypoxia.  No conversational dyspnea.  proBNP 13,000 with most recent proBNP recorded 1 month ago in the 3000's.  Her chest x-ray demonstrates cardiomegaly with pulmonary vascular congestion however it does look comparable to her x-ray on 07/30/2023.    Cardiology consulted.  I spoke with Dr. Francyne who recommends starting nitroglycerin , [facility], and admission.     Final diagnoses:  Secondary hypertension  Heart failure with reduced ejection fraction [facility])    ED Discharge Orders          Ordered    furosemide  (LASIX ) 20 MG tablet  Daily        08/31/23 1326    olmesartan  (BENICAR ) 20 MG tablet  Daily        08/31/23 1326    potassium chloride  SA (KLOR-CON  M) 20 MEQ tablet  Daily       Note to Pharmacy: tabs ok per Dr. Noralee secure chat cht   08/31/23 1326    Increase activity slowly        08/31/23 1326    Diet - low sodium heart healthy        08/31/23 1326    Discharge instructions       Comments: Please follow up with primary care in 7 to 10 days, follow up with  Cardiology as scheduled.   08/31/23 1326    Discharge instructions       Comments: Please weight every morning before breakfast, in case of weight gain 2 to 3 lbs in 24 hrs or 5 lbs in 7 days, please take furosemide  40 mg twice daily until weight back to baseline, take potassium tablets with furosemide .   08/31/23 1326    metoprolol  succinate (TOPROL -XL) 100 MG 24 hr tablet  Daily        08/31/23 1447               Ardena Gangl P, DO 09/10/23 0001

## 2023-08-27 NOTE — H&P (Addendum)
 History and Physical    Shannon [name]:[id number] DOB: Aug 08, 1950 DOA: 08/27/2023  I have briefly reviewed the patient's prior medical records in [facility]  PCP: Knute [name], FNP  Patient coming from: home  Chief Complaint: Shortness of breath  Patient seen and examined 08/27/2023  HPI: Shannon [name] is a 73 y.o. female with medical history significant of chronic systolic CHF, PAF previously ablated with multiple cardioversions, with multiple medication intolerances, comes into the hospital with progressive shortness of breath.  This has been going on for the last few days, but more so over the last few weeks.  She difficulties laying flat.  She denies any weight gain, however states that she has lost a lot of weight in the last few months.  She denies any fever or chills, no chest pain, no palpitations.  ED Course: In the ED she was found to be initially hypertensive with blood pressure as high as 181/92, A-fib with RVR with rates into the 130s.  Blood work shows a proBNP of 13,000, slight elevation of high-sensitivity troponin 26, 24, normal renal function.  A chest x-ray showed cardiomegaly and vascular congestion.  She was placed on nitroglycerin , cardiology was consulted and she was admitted to the hospital  Review of Systems: All systems reviewed, and apart from HPI, all negative  Past Medical History:  Diagnosis Date   Acute systolic heart failure (HCC) 07/08/2023   Allergy    Anemia    Anxiety    Arthritis    neck and lower back (03/25/2016)   Asthma 1990s X 1   short term inhaler use    CAD (coronary artery disease)    Cardiac pacemaker in situ    MDT   CHF (congestive heart failure) (HCC)    Chronic lower back pain    COVID-19 03/06/2022   Degenerative disorder of bone    Depression    Diastolic dysfunction    Drug-induced lupus erythematosus    HCTZ induced; still gettin over it (03/25/2016)   Foot swelling 05/19/2022   GERD  (gastroesophageal reflux disease)    Headache, unspecified headache type 03/06/2022   Herniated disc, cervical    Hospital discharge follow-up 03/14/2022   Hyperlipidemia    Hypertension    Neuromuscular disorder (HCC)    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 HTN   Obesity (BMI 30-39.9) 11/07/2022   Orthostatic hypotension    OSA on CPAP    Osteopenia    Osteoporosis 2012   PAF (paroxysmal atrial fibrillation) (HCC)    Paroxysmal atrial fibrillation (HCC) 12/13/2021   Phlebitis after infusion 03/18/2023   Pinched nerve in neck    PND (post-nasal drip) 12/18/2022   Rapid atrial fibrillation (HCC) 07/06/2023   Sinus node dysfunction (HCC)    Sinus pressure 12/18/2022   Sleep apnea    wears CPAP   Stroke (HCC)    T12 compression fracture (HCC) 11/2015   Vitamin D  deficiency    Whiplash injury 06/07/2010    Past Surgical History:  Procedure Laterality Date   APPENDECTOMY  1990s   ATRIAL FIBRILLATION ABLATION N/A 03/25/2016   Procedure: Atrial Fibrillation Ablation;  Surgeon: Lynwood Rakers, MD;  Location: [facility] INVASIVE CV LAB;  Service: Cardiovascular;  Laterality: N/A;   ATRIAL FIBRILLATION ABLATION N/A 01/31/2020   Procedure: ATRIAL FIBRILLATION ABLATION;  Surgeon: Rakers Lynwood, MD;  Location: [facility] INVASIVE CV LAB;  Service: Cardiovascular;  Laterality: N/A;   ATRIAL FIBRILLATION ABLATION N/A 12/13/2021   Procedure:  ATRIAL FIBRILLATION ABLATION;  Surgeon: [name], MD;  Location: [facility] INVASIVE CV LAB;  Service: Cardiovascular;  Laterality: N/A;   CARDIOVERSION N/A 07/09/2023   Procedure: CARDIOVERSION;  Surgeon: Okey Vina GAILS, MD;  Location: [facility] INVASIVE CV LAB;  Service: Cardiovascular;  Laterality: N/A;   COLONOSCOPY     FOREARM FRACTURE SURGERY Left ~ 02/2011   broke arm; shattered wrist   FOREARM HARDWARE REMOVAL Left ~ 07/2011   implantable loop recorder placement  03/07/2019   Medtronic Reveal Linq model LNQ 22 implantable loop recorder ([id number])  implanted by Dr Kelsie for Afib management   INSERT / REPLACE / REMOVE PACEMAKER  2025   LOOP RECORDER REMOVAL N/A 03/09/2023   Procedure: LOOP RECORDER REMOVAL;  Surgeon: Kennyth Chew, MD;  Location: [facility] INVASIVE CV LAB;  Service: Cardiovascular;  Laterality: N/A;   PACEMAKER IMPLANT N/A 03/09/2023   Procedure: PACEMAKER IMPLANT;  Surgeon: Kennyth Chew, MD;  Location: [facility] INVASIVE CV LAB;  Service: Cardiovascular;  Laterality: N/A;   Spinal Nerve Ablation     TEE WITHOUT CARDIOVERSION N/A 03/24/2016   Procedure: TRANSESOPHAGEAL ECHOCARDIOGRAM (TEE);  Surgeon: Jerel Balding, MD;  Location: [facility] ENDOSCOPY;  Service: Cardiovascular;  Laterality: N/A;     reports that she quit smoking about 9 years ago. Her smoking use included cigarettes and e-cigarettes. She started smoking about 48 years ago. She has a 22 pack-year smoking history. She has never used smokeless tobacco. She reports that she does not currently use alcohol. She reports that she does not use drugs.  Allergies  Allergen Reactions   Definity  [Perflutren  Lipid Microsphere] Other (See Comments)    Had unresponsive episode with concerns on allergic reaction. AVOID.   Ivp Dye [Iodinated Contrast Media] Shortness Of Breath   Sulfur Nausea And Vomiting   Ace Inhibitors Cough   Amiodarone  Other (See Comments)    Intolerance multiple side effects   Hctz [Hydrochlorothiazide ] Other (See Comments)    Caused drug-induced LUPUS   Oxycodone Other (See Comments)    Hallucinations   Prednisone  Other (See Comments)    Made patient very aggressive   Sulfa Antibiotics Nausea And Vomiting   Voltaren [Diclofenac Sodium] Other (See Comments)    Made patient become aggressive    Family History  Problem Relation Age of Onset   Lung cancer Mother    Cancer Mother    Early death Mother    Miscarriages / [place] Mother    Vision loss Mother    Stroke Father    Hypertension Father    Heart disease Father    Hypertension Maternal  Grandmother    Stroke Maternal Grandfather    Heart disease Maternal Grandfather    Vision loss Maternal Grandfather    Diabetes Paternal Grandmother    Heart disease Paternal Grandmother    Diabetes Paternal Grandfather    Hearing loss Paternal Grandfather    Vision loss Paternal Grandfather    Bipolar disorder Daughter    Other Daughter        fatty liver   Other Son        fattye liver, born with 1 kidney   ADD / ADHD Daughter    Alcohol abuse Daughter    Depression Daughter    Hypertension Daughter    Learning disabilities Daughter    Miscarriages / Stillbirths Daughter    Obesity Daughter    Vision loss Daughter    ADD / ADHD Son    Birth defects Son    Diabetes Son  Hearing loss Son    Learning disabilities Son    Obesity Son    Vision loss Son    Heart disease Brother    Hypertension Brother    Heart disease Sister    Hypertension Sister    Vision loss Sister    Obesity Sister    Colon cancer Neg Hx    Esophageal cancer Neg Hx    Rectal cancer Neg Hx    Stomach cancer Neg Hx     Prior to Admission medications   Medication Sig Start Date End Date Taking? Authorizing Provider  acetaminophen  (TYLENOL ) 500 MG tablet Take 1,000 mg by mouth every 6 (six) hours as needed for moderate pain or headache.    [provider]  benzonatate  (TESSALON ) 100 MG capsule Take 1 capsule (100 mg total) by mouth 3 (three) times daily as needed for cough. 06/30/23   [name], PA-C  budesonide -formoterol  (SYMBICORT ) 80-4.5 MCG/ACT inhaler Inhale 2 puffs into the lungs 2 (two) times daily. 07/13/23   [name], [name], FNP  busPIRone  (BUSPAR ) 5 MG tablet Take 1 tablet (5 mg total) by mouth 2 (two) times daily. 06/25/23   [name], NP  Cholecalciferol  (VITAMIN D ) 50 MCG (2000 UT) CAPS Take 2,000 Units by mouth in the morning.    [provider]  ELIQUIS  5 MG TABS tablet TAKE 1 TABLET BY MOUTH TWICE  DAILY 06/23/23   [name], MD  empagliflozin   (JARDIANCE ) 10 MG TABS tablet Take 1 tablet (10 mg total) by mouth daily before breakfast. 07/21/23   Darryle Thom CROME, PA-C  furosemide  (LASIX ) 20 MG tablet Take 20 mg by mouth.    [provider]  Hydrocortisone (CORTIZONE-10 EX) Apply 1 application  topically as needed (skin irritation/itching).    [provider]  levalbuterol  (XOPENEX  HFA) 45 MCG/ACT inhaler Inhale 2 puffs into the lungs every 4 (four) hours as needed for wheezing. 06/16/23   [name], NP  loratadine (CLARITIN) 10 MG tablet Take 10 mg by mouth daily as needed for allergies.    [provider]  metoprolol  succinate (TOPROL -XL) 100 MG 24 hr tablet Take 1 tablet (100 mg total) by mouth 2 (two) times daily. Take with or immediately following a meal. 08/03/23   [name], MD  Multiple Vitamin (MULTIVITAMIN) tablet Take 1 tablet by mouth at bedtime.    [provider]  olmesartan  (BENICAR ) 20 MG tablet Take 1 tablet (20 mg total) by mouth daily. 08/20/23   Darryle Thom CROME, PA-C  simvastatin  (ZOCOR ) 20 MG tablet TAKE 1 TABLET BY MOUTH DAILY 05/14/23   [name], NP    Physical Exam: Vitals:   08/27/23 2300 08/27/23 2301 08/27/23 2302 08/27/23 2324  BP: (!) 159/111 (!) 144/94  (!) 147/106  Pulse: (!) 129  (!) 131 (!) 119  Resp: 20 (!) 22 (!) 24 (!) 25  Temp: 97.9 F (36.6 C)   97.9 F (36.6 C)  TempSrc: Oral   Oral  SpO2: 99%  99% 98%  Weight:      Height:       Constitutional: NAD, calm, comfortable Eyes: PERRL, lids and conjunctivae normal ENMT: Mucous membranes are moist.  Neck: normal, supple Respiratory: Fair bibasilar crackles, no wheezing Cardiovascular: Regular rate and rhythm, no murmurs / rubs / gallops.  Edema, elevated JVD Abdomen: no tenderness, no masses palpated. Bowel sounds positive.  Musculoskeletal: no clubbing / cyanosis. Normal muscle tone.  Skin: no rashes, lesions, ulcers.  No induration Neurologic: Nonfocal, equal strength  Labs on Admission: I  have personally reviewed following labs and imaging studies  CBC: Recent Labs  Lab 08/27/23 1844  WBC 9.2  HGB 10.5*  HCT 34.3*  MCV 91.5  PLT 347   Basic Metabolic Panel: Recent Labs  Lab 08/27/23 1844  NA 139  K 3.5  CL 101  CO2 24  GLUCOSE 120*  BUN 12  CREATININE 0.79  CALCIUM  9.5   Liver Function Tests: No results for input(s): AST, ALT, ALKPHOS, BILITOT, PROT, ALBUMIN in the last 168 hours. Coagulation Profile: No results for input(s): INR, PROTIME in the last 168 hours. BNP (last 3 results) Recent Labs    07/01/23 1610 08/27/23 1858  PROBNP 3,772.0* 13,668.0*   CBG: No results for input(s): GLUCAP in the last 168 hours. Thyroid  Function Tests: No results for input(s): TSH, T4TOTAL, FREET4, T3FREE, THYROIDAB in the last 72 hours. Urine analysis:    Component Value Date/Time   COLORURINE COLORLESS (A) 02/28/2022 2019   APPEARANCEUR CLEAR 02/28/2022 2019   LABSPEC 1.006 02/28/2022 2019   PHURINE 5.5 02/28/2022 2019   GLUCOSEU NEGATIVE 02/28/2022 2019   HGBUR NEGATIVE 02/28/2022 2019   BILIRUBINUR NEGATIVE 02/28/2022 2019   BILIRUBINUR negative 03/29/2021 1126   KETONESUR NEGATIVE 02/28/2022 2019   PROTEINUR NEGATIVE 02/28/2022 2019   UROBILINOGEN 0.2 03/29/2021 1126   NITRITE NEGATIVE 02/28/2022 2019   LEUKOCYTESUR NEGATIVE 02/28/2022 2019     Radiological Exams on Admission: DG Chest Port 1 View Result Date: 08/27/2023 CLINICAL DATA:  Chest pain, hypertension EXAM: PORTABLE CHEST 1 VIEW COMPARISON:  07/30/2023 FINDINGS: Left pacer remains in place, unchanged. Mild cardiomegaly. Aortic atherosclerosis. No confluent airspace opacity, effusion or edema. Mild vascular congestion. IMPRESSION: Cardiomegaly, vascular congestion. Electronically Signed   By: Franky Crease M.D.   On: 08/27/2023 19:10    EKG: Independently reviewed.  A-fib with RVR  Assessment/Plan Principal problem Acute on chronic systolic CHF -most recent 2D  echocardiogram was done in July 2025 shows LVEF 30-35%, global hypokinesis, normal RV - Cardiology consulted, appreciate input.  She will be given furosemide  tonight, reevaluate in the morning  Active problems PAF, with RVR -follows with cardiology as an outpatient.  Last seen August 20, 2023.  She has had 3 ablations, last 1 in December 2023.  She was on flecainide  which was stopped due to fatigue, failed Tikosyn  due to QT prolongation, and she had blurry vision, fatigue, shortness of breath with amiodarone  - Just admitted in July for A-fib with RVR status post DCCV -Now back into A-fib, resume home metoprolol  as well as Eliquis .  She is scheduled to get another ablation this September.  Troponin elevation - flat, not in a pattern consistent with ACS, no chest pain, this is likely demand ischemia due to A-fib with RVR  Essential hypertension-per cardiology, starting metoprolol , Avapro , Lasix .  She was placed on nitroglycerin  infusion in the ED, continue  Anxiety-continue home BuSpar   History of COPD-no wheezing, continue home medications   DVT prophylaxis: Eliquis  Code Status: Full code Family Communication: Family at bedside Disposition Plan: Home when ready Bed Type: Progressive Consults called: Cardiology Obs/Inp: Inpatient  At the time of admission, it appears that the appropriate admission status for this patient is INPATIENT as it is expected that patient will require hospital care > 2 midnights. This is judged to be reasonable and necessary in order to provide the required intensity of service to ensure the patient's safety given: presenting symptoms, initial radiographic and laboratory data  and in the context of their chronic comorbidities. Together, these circumstances are felt to place patient at high at high risk for further clinical deterioration threatening life, limb, or organ.  Nilda Fendt, MD, PhD [facility]  Contact via [email or web address]  08/27/2023, 11:57 PM

## 2023-08-28 ENCOUNTER — Other Ambulatory Visit: Payer: Self-pay

## 2023-08-28 ENCOUNTER — Ambulatory Visit: Admitting: Behavioral Health

## 2023-08-28 ENCOUNTER — Encounter (HOSPITAL_COMMUNITY): Payer: Self-pay | Admitting: Family Medicine

## 2023-08-28 DIAGNOSIS — I5023 Acute on chronic systolic (congestive) heart failure: Secondary | ICD-10-CM

## 2023-08-28 DIAGNOSIS — I10 Essential (primary) hypertension: Secondary | ICD-10-CM

## 2023-08-28 DIAGNOSIS — J441 Chronic obstructive pulmonary disease with (acute) exacerbation: Secondary | ICD-10-CM

## 2023-08-28 DIAGNOSIS — I160 Hypertensive urgency: Secondary | ICD-10-CM

## 2023-08-28 DIAGNOSIS — I4891 Unspecified atrial fibrillation: Secondary | ICD-10-CM

## 2023-08-28 DIAGNOSIS — E876 Hypokalemia: Secondary | ICD-10-CM

## 2023-08-28 DIAGNOSIS — Z8673 Personal history of transient ischemic attack (TIA), and cerebral infarction without residual deficits: Secondary | ICD-10-CM

## 2023-08-28 DIAGNOSIS — M353 Polymyalgia rheumatica: Secondary | ICD-10-CM

## 2023-08-28 LAB — COMPREHENSIVE METABOLIC PANEL WITH GFR
ALT: 20 U/L (ref 0–44)
AST: 22 U/L (ref 15–41)
Albumin: 3 g/dL — ABNORMAL LOW (ref 3.5–5.0)
Alkaline Phosphatase: 54 U/L (ref 38–126)
Anion gap: 11 (ref 5–15)
BUN: 7 mg/dL — ABNORMAL LOW (ref 8–23)
CO2: 29 mmol/L (ref 22–32)
Calcium: 8.8 mg/dL — ABNORMAL LOW (ref 8.9–10.3)
Chloride: 104 mmol/L (ref 98–111)
Creatinine, Ser: 0.71 mg/dL (ref 0.44–1.00)
GFR, Estimated: >60 mL/min (ref >60)
Glucose, Bld: 110 mg/dL — ABNORMAL HIGH (ref 70–99)
Potassium: 3.4 mmol/L — ABNORMAL LOW (ref 3.5–5.1)
Sodium: 144 mmol/L (ref 135–145)
Total Bilirubin: 1.4 mg/dL — ABNORMAL HIGH (ref 0.0–1.2)
Total Protein: 6.4 g/dL — ABNORMAL LOW (ref 6.5–8.1)

## 2023-08-28 LAB — CBC
HCT: 33.2 % — ABNORMAL LOW (ref 36.0–46.0)
Hemoglobin: 10.1 g/dL — ABNORMAL LOW (ref 12.0–15.0)
MCH: 28.4 pg (ref 26.0–34.0)
MCHC: 30.4 g/dL (ref 30.0–36.0)
MCV: 93.3 fL (ref 80.0–100.0)
Platelets: 345 K/uL (ref 150–400)
RBC: 3.56 MIL/uL — ABNORMAL LOW (ref 3.87–5.11)
RDW: 16.9 % — ABNORMAL HIGH (ref 11.5–15.5)
WBC: 9.2 K/uL (ref 4.0–10.5)
nRBC: 0 % (ref 0.0–0.2)

## 2023-08-28 MED ORDER — ORAL CARE MOUTH RINSE
15.0000 mL | OROMUCOSAL | Status: DC | PRN
Start: 2023-08-28 — End: 2023-08-31

## 2023-08-28 MED ORDER — FUROSEMIDE 10 MG/ML IJ SOLN
60.0000 mg | Freq: Two times a day (BID) | INTRAMUSCULAR | Status: DC
  Administered 2023-08-28 – 2023-08-29 (×2): 60 mg via INTRAVENOUS
  Filled 2023-08-28 (×2): qty 6

## 2023-08-28 MED ORDER — POTASSIUM CHLORIDE CRYS ER 20 MEQ PO TBCR
40.0000 meq | EXTENDED_RELEASE_TABLET | ORAL | Status: AC
  Administered 2023-08-28 (×2): 40 meq via ORAL
  Filled 2023-08-28 (×2): qty 2

## 2023-08-28 NOTE — Progress Notes (Addendum)
 Progress Note   Patient: Shannon [name] FMW:[id number] DOB: 11-20-1950 DOA: 08/27/2023     1 DOS: the patient was seen and examined on 08/28/2023   Brief hospital course: Shannon [name] was admitted to the hospital with the working diagnosis of heart failure exacerbation in the setting of atrial fibrillation with RVR.   73 yo female with the past medical history of heart failure, paroxysmal atrial fibrillation, who presented with dyspnea.  Reported worsening dyspnea on exertion and orthopnea. Her blood pressure has been high and was recently started on Olmesartan . On the day of admission she had severe dyspnea along with chest pain, prompting her to come to the hospital.  On her initial physical examination her blood pressure was 159/111, HR 129, RR 22 and 02 saturation 98%  Lungs with rales bilaterally with no wheezing, heart with S1 and S2 present, irregularly irregular with no gallops, positive JV,D,abdomen with no distention,  positive lower extremity edema.   Na 139, K 3.5 Cl 101 bicarbonate 24 glucose 120, bun 12 cr 0,79  BNP 13,688 High sensitive troponin 26 and 24  Wbc 9,2 hgb 10,5 plt 347   Chest radiograph with cardiomegaly, bilateral hilar vascular congestion, prominent right pulmonary artery, no effusions or infiltrates. 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   EKG 63 bpm, normal axis, normal intervals, qtc 475, atrial fibrillation rhythm with no ST segment changes, negative T wave lead II, III, aVF, V2 to V5.   Patient was placed on IV furosemide  and IV nitroglycerin .   08/22 continue volume overloaded and in atrial fibrillation with RVR.   Assessment and Plan: * Acute on chronic systolic CHF (congestive heart failure) (HCC) Echocardiogram with reduced LV systolic function with EF 30 to 35%, global hypokinesis, RV systolic function preserved, LA and RA with moderate dilatation,   Documented urine output 400 cc Systolic blood pressure 120  mmHg range.   Continue diuresis with furosemide  60 mg IV bid Continue irbesartan  and metoprolol  SGLT 2 inh  Apparently she did not tolerate spironolactone  in the past.   Troponin high sensitive elevation due to atrial fibrillation and heart failure, no acute coronary syndrome.   Atrial fibrillation with RVR (HCC) Continue rapid ventricular response Patient had PVI ablation, 2018, 2022, and 2023   Continue metoprolol  succinate 100 mg daily.  Patient did not tolerate antiarrhythmic therapy in the past. 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  Continue telemetry monitoring   Primary hypertension Uncontrolled hypertension  Plan to continue irbesartan  and metoprolol . Continue diuresis  Wean off nitroglycerin  drip   Chronic obstructive pulmonary disease with acute exacerbation (HCC) No signs of acute exacerbation Continue bronchodilator therapy  Hypokalemia Renal function with serum cr at 0,71 with K at 3,4 and serum bicarbonate at 29  Na 144   Plan to add Kcl 40 meq x2 and follow up renal function and electrolytes Avoid hypotension or nephrotoxic medications.   Polymyalgia rheumatica (HCC) Anxiety and depression  Continue buspirone    History of CVA (cerebrovascular accident) Continue anticoagulation for atrial fibrillation On statin therapy         Subjective: patient with mild improvement in her symptoms but continue with dyspnea and chest tightness   Physical Exam: Vitals:   08/28/23 0800 08/28/23 0900 08/28/23 0926 08/28/23 1035  BP: (!) 135/96 117/81  126/77  Pulse: (!) 123 (!) 133  (!) 124  Resp: 20 20 20 20   Temp: 98.1 F (  36.7 C)   98.5 F (36.9 C)  TempSrc: Axillary   Oral  SpO2: 94% 94%  94%  Weight:      Height:       Neurology awake and alert ENT with mild pallor Cardiovascular with S1 and S2 present, irregularly irregular with no gallops or rubs, positive systolic murmur at  the right lower sternal border Positive JVD Respiratory with rales bilaterally with no wheezing or rhonchi  Abdomen with no distention    Data Reviewed:    Family Communication: no family at the bedside   Disposition: Status is: Inpatient Remains inpatient appropriate because: IV diuresis   Planned Discharge Destination: Home    Author: Elidia Toribio Furnace, MD 08/28/2023 11:48 AM  For on call review [email or web address].

## 2023-08-28 NOTE — Progress Notes (Signed)
 Rounding Note   Patient Name: Shannon [name] Date of Encounter: 08/28/2023  [facility] Cardiologist: None   Subjective Feeling better.  Less pressure in her chest.  Able to take deeper breaths.   Scheduled Meds:  apixaban   5 mg Oral BID   busPIRone   5 mg Oral BID   cholecalciferol   2,000 Units Oral Daily   empagliflozin   10 mg Oral Daily   fluticasone  furoate-vilanterol  1 puff Inhalation Daily   furosemide   40 mg Intravenous BID   irbesartan   150 mg Oral Daily   metoprolol  succinate  100 mg Oral Daily   multivitamin with minerals  1 tablet Oral QHS   potassium chloride   40 mEq Oral Q4H   simvastatin   20 mg Oral Daily   Continuous Infusions:  nitroGLYCERIN  110 mcg/min (08/28/23 0713)   PRN Meds: acetaminophen  **OR** acetaminophen , levalbuterol , ondansetron  **OR** ondansetron  (ZOFRAN ) IV, mouth rinse   Vital Signs  Vitals:   08/28/23 0700 08/28/23 0800 08/28/23 0900 08/28/23 0926  BP: (!) 159/86 (!) 135/96 117/81   Pulse: (!) 112 (!) 123 (!) 133   Resp: (!) 21 20 20 20   Temp:  98.1 F (36.7 C)    TempSrc:  Axillary    SpO2: 92% 94% 94%   Weight:      Height:        Intake/Output Summary (Last 24 hours) at 08/28/2023 1009 Last data filed at 08/28/2023 0713 Gross per 24 hour  Intake 484.73 ml  Output 400 ml  Net 84.73 ml      08/28/2023    3:10 AM 08/27/2023   10:54 PM 08/27/2023    6:36 PM  Last 3 Weights  Weight (lbs) 185 lb 6.5 oz 185 lb 13.6 oz 186 lb  Weight (kg) 84.1 kg 84.3 kg 84.369 kg      Telemetry Atrial fibrillation with RVR.  Rate 120s-160s - Personally Reviewed  ECG  N/a - Personally Reviewed  Physical Exam  VS:  BP 117/81   Pulse (!) 133   Temp 98.1 F (36.7 C) (Axillary)   Resp 20   Ht 5' 7 (1.702 m)   Wt 84.1 kg   SpO2 94%   BMI 29.04 kg/m  , BMI Body mass index is 29.04 kg/m. GENERAL:  Anxious and tearful  HEENT: Pupils equal round and reactive, fundi not visualized, oral mucosa unremarkable NECK:  +  jugular venous distention, waveform within normal limits, carotid upstroke brisk and symmetric, no bruits, no thyromegaly LUNGS:  Clear to auscultation bilaterally HEART:  Tachycardic.  Irregularly irregular.  PMI not displaced or sustained,S1 and S2 within normal limits, no S3, no S4, no clicks, no rubs, no murmurs ABD:  Flat, positive bowel sounds normal in frequency in pitch, no bruits, no rebound, no guarding, no midline pulsatile mass, no hepatomegaly, no splenomegaly EXT:  2 plus pulses throughout, no edema, no cyanosis no clubbing SKIN:  No rashes no nodules NEURO:  Cranial nerves II through XII grossly intact, motor grossly intact throughout PSYCH:  Cognitively intact, oriented to person place and time   Labs High Sensitivity Troponin:   Recent Labs  Lab 07/30/23 0730  TROPONINIHS 46*     Chemistry Recent Labs  Lab 08/27/23 1844 08/28/23 0221  NA 139 144  K 3.5 3.4*  CL 101 104  CO2 24 29  GLUCOSE 120* 110*  BUN 12 7*  CREATININE 0.79 0.71  CALCIUM  9.5 8.8*  PROT  --  6.4*  ALBUMIN  --  3.0*  AST  --  22  ALT  --  20  ALKPHOS  --  54  BILITOT  --  1.4*  GFRNONAA >60 >60  ANIONGAP 13 11    Lipids No results for input(s): CHOL, TRIG, HDL, LABVLDL, LDLCALC, CHOLHDL in the last 168 hours.  Hematology Recent Labs  Lab 08/27/23 1844 08/28/23 0221  WBC 9.2 9.2  RBC 3.75* 3.56*  HGB 10.5* 10.1*  HCT 34.3* 33.2*  MCV 91.5 93.3  MCH 28.0 28.4  MCHC 30.6 30.4  RDW 17.1* 16.9*  PLT 347 345   Thyroid  No results for input(s): TSH, FREET4 in the last 168 hours.  BNP Recent Labs  Lab 08/27/23 1858  PROBNP 13,668.0*    DDimer No results for input(s): DDIMER in the last 168 hours.   Radiology  DG Chest Port 1 View Result Date: 08/27/2023 CLINICAL DATA:  Chest pain, hypertension EXAM: PORTABLE CHEST 1 VIEW COMPARISON:  07/30/2023 FINDINGS: Left pacer remains in place, unchanged. Mild cardiomegaly. Aortic atherosclerosis. No confluent airspace  opacity, effusion or edema. Mild vascular congestion. IMPRESSION: Cardiomegaly, vascular congestion. Electronically Signed   By: Franky Crease M.D.   On: 08/27/2023 19:10   Cardiac Studies  Echo 07/07/23: 1. Left ventricular ejection fraction, by estimation, is 30 to 35%. The  left ventricle has moderately decreased function. The left ventricle  demonstrates global hypokinesis. There is mild concentric left ventricular  hypertrophy. Left ventricular  diastolic function could not be evaluated.   2. Right ventricular systolic function is normal. The right ventricular  size is normal. Tricuspid regurgitation signal is inadequate for assessing  PA pressure.   3. Left atrial size was moderately dilated.   4. Right atrial size was moderately dilated.   5. The mitral valve is normal in structure. No evidence of mitral valve  regurgitation. No evidence of mitral stenosis.   6. The aortic valve is tricuspid. Aortic valve regurgitation is not  visualized. No aortic stenosis is present.   7. The inferior vena cava is normal in size with greater than 50%  respiratory variability, suggesting right atrial pressure of 3 mmHg.   Patient Profile   73 y.o. female with HFrEF, paroxysmal ablation s/p multiple ablations, SSS s/p PPM, anxiety and OSA admitted with hypertensive urgency.   Assessment & Plan   # Atrial fibrillation with RVR: Rates uncontrolled.  She reports just going into atrial fibrillation in the last 24 hours.  AM metoprolol  dose is pending.  Will follow.  She is scheduled for AVN ablation 9/9.  Continue Eliquis .  She didn't tolerate several antiarrhythmics.   # HFrEF:   # Hypertensive urgency:  LVEF 30-35%.  She is mildly volume overloaded.  IV lasix  today.  Likely transition to oral tomorrow.  We discussed her Entresto  and how it may be worth re-trying, given that her issue was a single drop in BP and she is now admitted with hypertensive urgency.  She declines this idea.  Will observe her  BP after getting morning meds and titrate down her nitroglycerin  infusion.   # Demand ischemia: Hs-troponin mildly elevated and flat.    # Anxiety:  Definitely contributing.  She is seeing a counselor which is helpful.  Also strong faith.  Continue Buspar .   # Hyperlipidemia;  Continue simvastatin .    For questions or updates, please contact [facility] Please consult [email or web address] for contact info under     Signed, Annabella Scarce, MD  08/28/2023, 10:09 AM

## 2023-08-28 NOTE — Assessment & Plan Note (Signed)
 Continue anticoagulation for atrial fibrillation On statin therapy

## 2023-08-28 NOTE — Assessment & Plan Note (Signed)
No signs of acute exacerbation. Continue bronchodilator therapy.

## 2023-08-28 NOTE — Assessment & Plan Note (Addendum)
 Echocardiogram with reduced LV systolic function with EF 30 to 35%, global hypokinesis, RV systolic function preserved, LA and RA with moderate dilatation,   Patient was placed on IV furosemide  for diuresis, negative fluid balance was achieved, - 9,337 ml with significant improvement in her symptoms   08/23 Metolazone  2,5 mg x1  Continue irbesartan  and metoprolol  SGLT 2 inh  Continue diuresis with furosemide  40 mg daily and increase to bid in case of volume overload.  Apparently she did not tolerate spironolactone  in the past.   Troponin high sensitive elevation due to atrial fibrillation and heart failure, no acute coronary syndrome.

## 2023-08-28 NOTE — Assessment & Plan Note (Addendum)
 Patient had PVI ablation, 2018, 2022, and 2023   Converted to sinus rhythm yesterday  Continue metoprolol  succinate 100 mg daily.  Patient did not tolerate antiarrhythmic therapy in the past. 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  Continue telemetry monitoring

## 2023-08-28 NOTE — Assessment & Plan Note (Addendum)
 Anxiety and depression  Continue buspirone 

## 2023-08-28 NOTE — Progress Notes (Signed)
  NTG titration is now up to 100 mcg/min. Pt currently has no chest pain. Vital signs as documented below. Atrial fib on the monitor, HR is under controlled now. Pt is able to rest. No complaints. No obvious acute distress. We will continue to monitor.   08/28/23 0400  Vitals  Temp 98.1 F (36.7 C)  Temp Source Oral  BP 115/78  MAP (mmHg) 91  BP Location Left Arm  BP Method Automatic  Patient Position (if appropriate) Lying  Pulse Rate (!) 102-112  Pulse Rate Source Monitor  ECG Heart Rate (!) 102-112  Resp 16-20  Level of Consciousness  Level of Consciousness Alert  MEWS COLOR  MEWS Score Color Yellow  Oxygen Therapy  SpO2 99 %  O2 Device CPAP  Pain Assessment  Pain Scale 0-10  Pain Score 0   Wendi Dash, RN

## 2023-08-28 NOTE — Assessment & Plan Note (Addendum)
 Today renal function with serum cr at 0,9 with K at 3,8 and serum bicarbonate at 28  Na 139 Mg 2.3   Plan to add Kcl 40 meq x1 and follow up renal function and electrolytes Avoid hypotension or nephrotoxic medications.

## 2023-08-28 NOTE — Hospital Course (Addendum)
 Shannon [name] was admitted to the hospital with the working diagnosis of heart failure exacerbation in the setting of atrial fibrillation with RVR.   73 yo female with the past medical history of heart failure, paroxysmal atrial fibrillation, who presented with dyspnea.  Reported worsening dyspnea on exertion and orthopnea. Her blood pressure has been high and was recently started on Olmesartan . On the day of admission she had severe dyspnea along with chest pain, prompting her to come to the hospital.  On her initial physical examination her blood pressure was 159/111, HR 129, RR 22 and 02 saturation 98%  Lungs with rales bilaterally with no wheezing, heart with S1 and S2 present, irregularly irregular with no gallops, positive JV,D,abdomen with no distention,  positive lower extremity edema.   Na 139, K 3.5 Cl 101 bicarbonate 24 glucose 120, bun 12 cr 0,79  BNP 13,688 High sensitive troponin 26 and 24  Wbc 9,2 hgb 10,5 plt 347   Chest radiograph with cardiomegaly, bilateral hilar vascular congestion, prominent right pulmonary artery, no effusions or infiltrates. 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   EKG 63 bpm, normal axis, normal intervals, qtc 475, atrial fibrillation rhythm with no ST segment changes, negative T wave lead II, III, aVF, V2 to V5.   Patient was placed on IV furosemide  and IV nitroglycerin .   08/22 continue volume overloaded and in atrial fibrillation with RVR.  Patient converted to sinus rhythm 08/23 continue diuresis  08/24 clinically with euvolemic state, plan for continue with oral loop diuretic at home.

## 2023-08-28 NOTE — Assessment & Plan Note (Addendum)
 Uncontrolled hypertension  Plan to continue with metoprolol , she received irbesartan  300 mg in the hospital.  She will continue olmesartan  at discharge at increased dose.

## 2023-08-28 NOTE — Progress Notes (Signed)
 The patient is complaining of ongoing chest heaviness/pressure which according to her started at noon. She rates the pain as 4/10  on a pain scale. Patient refused to take a strong pain medication and requested for Tylenol ,  Tylenol  650 mg administered. Will continue to monitor.

## 2023-08-28 NOTE — Progress Notes (Signed)
 PT Cancellation Note  Patient Details Name: Shannon [name] MRN: [id number] DOB: Apr 01, 1950   Cancelled Treatment:    Reason Eval/Treat Not Completed: Fatigue/lethargy limiting ability to participate; resting HR in 120's and pt reports just up to Shannon [name] increases and wears her out.  Eager to progress when she can, will follow up to progress as able.  RN aware.   Montie Portal 08/28/2023, 1:22 PM Micheline Portal, PT Acute Rehabilitation Services [phone] 08/28/2023

## 2023-08-29 DIAGNOSIS — I5023 Acute on chronic systolic (congestive) heart failure: Secondary | ICD-10-CM | POA: Diagnosis not present

## 2023-08-29 DIAGNOSIS — I10 Essential (primary) hypertension: Secondary | ICD-10-CM | POA: Diagnosis not present

## 2023-08-29 DIAGNOSIS — I4891 Unspecified atrial fibrillation: Secondary | ICD-10-CM | POA: Diagnosis not present

## 2023-08-29 DIAGNOSIS — J441 Chronic obstructive pulmonary disease with (acute) exacerbation: Secondary | ICD-10-CM | POA: Diagnosis not present

## 2023-08-29 LAB — BASIC METABOLIC PANEL WITH GFR
Anion gap: 7 (ref 5–15)
BUN: 14 mg/dL (ref 8–23)
CO2: 28 mmol/L (ref 22–32)
Calcium: 9 mg/dL (ref 8.9–10.3)
Chloride: 104 mmol/L (ref 98–111)
Creatinine, Ser: 0.99 mg/dL (ref 0.44–1.00)
GFR, Estimated: >60 mL/min (ref >60)
Glucose, Bld: 106 mg/dL — ABNORMAL HIGH (ref 70–99)
Potassium: 3.8 mmol/L (ref 3.5–5.1)
Sodium: 139 mmol/L (ref 135–145)

## 2023-08-29 LAB — MAGNESIUM: Magnesium: 2.3 mg/dL (ref 1.7–2.4)

## 2023-08-29 MED ORDER — FUROSEMIDE 10 MG/ML IJ SOLN
80.0000 mg | Freq: Two times a day (BID) | INTRAMUSCULAR | Status: DC
  Administered 2023-08-29 – 2023-08-30 (×3): 80 mg via INTRAVENOUS
  Filled 2023-08-29 (×3): qty 8

## 2023-08-29 MED ORDER — METOLAZONE 2.5 MG PO TABS
2.5000 mg | ORAL_TABLET | ORAL | Status: AC
  Administered 2023-08-29: 2.5 mg via ORAL
  Filled 2023-08-29: qty 1

## 2023-08-29 MED ORDER — POTASSIUM CHLORIDE CRYS ER 20 MEQ PO TBCR
40.0000 meq | EXTENDED_RELEASE_TABLET | Freq: Once | ORAL | Status: AC
Start: 2023-08-29 — End: 2023-08-29
  Administered 2023-08-29: 40 meq via ORAL
  Filled 2023-08-29: qty 2

## 2023-08-29 NOTE — Evaluation (Signed)
 Occupational Therapy Evaluation Patient Details Name: Shannon [name] MRN: [id number] DOB: 1950-08-16 Today's Date: 08/29/2023   History of Present Illness   The pt is a 73 yo female presenting 8/21 with worsening SOB and HTN. Admitted for acute CHF exacerbation with afib with RVR. 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     Pt is a 73 yo female presenting 2/1 with visual field deficit and found to have R medial occipital stroke. PMH includes: GERD, OSA on CPAP, HTN, HLD, afib, depression, CAD     Clinical Impressions Pt admitted for above, PTA pt reports typically ambulating no DME but will occasionally furniture walk in home, generally ind with ADLs but has been having assist in the last week. Pt currently presenting not far from functional baseline, ambulating with Supervision but benefiting from UE support, used Rollator and educated pt on its use and benefits for prn rest breaks. Pt simulated ability to complete home ADLs with supervision/setup A. She reports weighing herself daily and being compliant with CHF diet. OT to continue to monitor pt while in acute setting to ensure adequate progression prior to DC, pt likely to benefit from further working with MS team. No post acute OT needed.      If plan is discharge home, recommend the following:   Other (comment) (PRN)     Functional Status Assessment   Patient has not had a recent decline in their functional status     Equipment Recommendations   Other (comment) (Rollator)     Recommendations for Other Services         Precautions/Restrictions   Precautions Precautions: Fall Recall of Precautions/Restrictions: Intact Restrictions Weight Bearing Restrictions Per Provider Order: No     Mobility Bed Mobility Overal bed mobility: Modified Independent                   Transfers Overall transfer level: Needs assistance Equipment used: None Transfers: Sit to/from Stand Sit to Stand: Supervision                  Balance Overall balance assessment: Needs assistance Sitting-balance support: No upper extremity supported, Feet supported Sitting balance-Leahy Scale: Good       Standing balance-Leahy Scale: Fair Standing balance comment: reliant on some form of UE support.                           ADL either performed or assessed with clinical judgement   ADL Overall ADL's : Needs assistance/impaired Eating/Feeding: Independent;Sitting   Grooming: Standing;Supervision/safety   Upper Body Bathing: Set up;Sitting   Lower Body Bathing: Set up;Sitting/lateral leans Lower Body Bathing Details (indicate cue type and reason): simulated Upper Body Dressing : Sitting;Set up   Lower Body Dressing: Supervision/safety;Sit to/from stand Lower Body Dressing Details (indicate cue type and reason): simulated donning pants Toilet Transfer: Supervision/safety;Rollator (4 wheels);Ambulation   Toileting- Clothing Manipulation and Hygiene: Supervision/safety;Sit to/from stand       Functional mobility during ADLs: Rollator (4 wheels);Supervision/safety General ADL Comments: Pt ambulated with supervision + Rollator, educated her on proper use of Rollator with safe brake managment.     Vision   Vision Assessment?: No apparent visual deficits     Perception         Praxis         Pertinent Vitals/Pain Pain Assessment Pain Assessment:  0-10 Pain Score: 3  Pain Location: abdomen and chest with SOB Pain Descriptors / Indicators: Discomfort Pain Intervention(s): Limited activity within patient's tolerance, Monitored during session, Repositioned     Extremity/Trunk Assessment Upper Extremity Assessment Upper Extremity Assessment: Overall WFL for tasks assessed   Lower Extremity Assessment Lower Extremity Assessment: Defer to PT  evaluation   Cervical / Trunk Assessment Cervical / Trunk Assessment: Normal   Communication Communication Communication: No apparent difficulties Factors Affecting Communication: Hearing impaired   Cognition Arousal: Alert Behavior During Therapy: WFL for tasks assessed/performed Cognition: No apparent impairments                               Following commands: Intact       Cueing  General Comments   Cueing Techniques: Verbal cues  VSS RA, Sp02 >96%   Exercises     Shoulder Instructions      Home Living Family/patient expects to be discharged to:: Private residence Living Arrangements: Children Available Help at Discharge: Family;Available 24 hours/day (daughter works from home) Type of Home: House Home Access: Stairs to enter [employer] of Steps: 3 stairs in garage Entrance Stairs-Rails: Right;Can reach both;Left Home Layout: Multi-level;Able to live on main level with bedroom/bathroom Alternate Level Stairs-Number of Steps: pt does not go upstairs   Bathroom Shower/Tub: Producer, television/film/video: Standard     Home Equipment: Agricultural consultant (2 wheels);Hand held shower head;Shower seat - built in;Grab bars - tub/shower          Prior Functioning/Environment Prior Level of Function : Independent/Modified Independent;Driving;History of Falls (last six months)             Mobility Comments: typically no DME, uses RW at night for bathroom trips. 1 fall at night. no exercise or activity. takes care of 73yo ADLs Comments: difficult in last week, but typically independent    OT Problem List: Decreased activity tolerance;Cardiopulmonary status limiting activity;Impaired balance (sitting and/or standing)   OT Treatment/Interventions: Self-care/ADL training;Therapeutic exercise;Patient/family education;Balance training;Therapeutic activities;DME and/or AE instruction      OT Goals(Current goals can be found in the care plan  section)   Acute Rehab OT Goals Patient Stated Goal: TO get better OT Goal Formulation: With patient Time For Goal Achievement: 09/12/23 Potential to Achieve Goals: Good ADL Goals Pt Will Perform Grooming: with modified independence;standing Pt Will Perform Lower Body Bathing: with modified independence;sit to/from stand Pt Will Perform Lower Body Dressing: with modified independence;sit to/from stand Pt Will Transfer to Toilet: with modified independence;ambulating Pt Will Perform Tub/Shower Transfer: Shower transfer;with modified independence;ambulating   OT Frequency:  Min 2X/week    Co-evaluation              AM-PAC OT 6 Clicks Daily Activity     Outcome Measure Help from another person eating meals?: None Help from another person taking care of personal grooming?: A Little Help from another person toileting, which includes using toliet, bedpan, or urinal?: A Little Help from another person bathing (including washing, rinsing, drying)?: A Little Help from another person to put on and taking off regular upper body clothing?: A Little Help from another person to put on and taking off regular lower body clothing?: A Little 6 Click Score: 19   End of Session Equipment Utilized During Treatment: Rollator (4 wheels) Nurse Communication: Mobility status  Activity Tolerance: Patient tolerated treatment well Patient left: in bed;with call bell/phone within reach;with family/visitor present  OT Visit Diagnosis: Unsteadiness on feet (R26.81);Other abnormalities of gait and mobility (R26.89)                Time: 8387-8360 OT Time Calculation (min): 27 min Charges:  OT General Charges $OT Visit: 1 Visit OT Evaluation $OT Eval Low Complexity: 1 Low OT Treatments $Self Care/Home Management : 8-22 mins  08/29/2023  AB, OTR/L  Acute Rehabilitation Services  Office: [phone]   Curtistine JONETTA Das 08/29/2023, 5:08 PM

## 2023-08-29 NOTE — Evaluation (Addendum)
 Physical Therapy Evaluation Patient Details Name: Shannon [name] MRN: [id number] DOB: February 22, 1950 Today's Date: 08/29/2023  History of Present Illness  The pt is a 73 yo female presenting 8/21 with worsening SOB and HTN. Admitted for acute CHF exacerbation with afib with RVR. 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     Pt is a 73 yo female presenting 2/1 with visual field deficit and found to have R medial occipital stroke. PMH includes: GERD, OSA on CPAP, HTN, HLD, afib, depression, CAD   Clinical Impression  Pt in bed upon arrival of PT, agreeable to evaluation at this time. Prior to admission the pt was independent without DME, reports typically able to ambulate in house, carry laundry, and play with 2yo grandson. Recently, pt unable to manage room-to-room distances without significant rest and fatigue, and unable to play with her grandson. Pt completing sit-stand transfers with supervision, but fatigues after x5 in a row. She was able to complete 132ft hallway ambulation, but demos increased sway and slowed speeds, will benefit from continued skilled PT acutely to progress functional stability, strength, endurance, and activity tolerance prior to return home.   5X Sit-to-Stand: 15.8 sec (> 12.6 sec indicates increased risk of falls for individuals aged 31-79, > 15 sec indicates increased risk of recurrent falls)      If plan is discharge home, recommend the following: A little help with walking and/or transfers;A little help with bathing/dressing/bathroom;Assist for transportation;Help with stairs or ramp for entrance   Can travel by private vehicle        Equipment Recommendations Rollator (4 wheels)  Recommendations for Other Services       Functional Status Assessment Patient has had a recent decline in their functional status and demonstrates the ability  to make significant improvements in function in a reasonable and predictable amount of time.     Precautions / Restrictions Precautions Precautions: Fall Recall of Precautions/Restrictions: Intact Restrictions Weight Bearing Restrictions Per Provider Order: No      Mobility  Bed Mobility Overal bed mobility: Modified Independent             General bed mobility comments: returned to bed at end of session without assist    Transfers Overall transfer level: Needs assistance Equipment used: None Transfers: Sit to/from Stand, Bed to chair/wheelchair/BSC Sit to Stand: Supervision   Step pivot transfers: Supervision       General transfer comment: supervision for safety, slow to rise but able to complete without UE support    Ambulation/Gait Ambulation/Gait assistance: Contact guard assist Gait Distance (Feet): 125 Feet Assistive device: None Gait Pattern/deviations: Step-through pattern, Decreased stride length, Wide base of support, Drifts right/left Gait velocity: decreased Gait velocity interpretation: <1.31 ft/sec, indicative of household ambulator   General Gait Details: increased sway and wide BOS. no overt buckling or UE support. pt with slowed gait compared to her baseline     Balance     Sitting balance-Leahy Scale: Good Sitting balance - Comments: able to lean outside BOS   Standing balance support: No upper extremity supported, During functional activity Standing balance-Leahy Scale: Fair Standing balance comment: able to ambulate, poor tolerance for balance challenge without UE support                             Pertinent Vitals/Pain Pain Assessment Pain Assessment: 0-10 Pain  Score: 3  Pain Location: abdomen and chest with SOB Pain Descriptors / Indicators: Discomfort Pain Intervention(s): Limited activity within patient's tolerance, Monitored during session, Repositioned    Home Living Family/patient expects to be discharged to::  Private residence Living Arrangements: Children Available Help at Discharge: Family;Available 24 hours/day (daughter works from home) Type of Home: House Home Access: Stairs to enter Entrance Stairs-Rails: Right;Can reach Technical sales engineer of Steps: 3 stairs in garage Alternate Level Stairs-Number of Steps: pt does not go upstairs Home Layout: Multi-level;Able to live on main level with bedroom/bathroom Home Equipment: Rolling Walker (2 wheels);Hand held shower head;Shower seat - built in;Grab bars - tub/shower (reports daughter brough [facility] for her today, unsure what kind)      Prior Function Prior Level of Function : Independent/Modified Independent;Driving;History of Falls (last six months)             Mobility Comments: typically no DME, uses RW at night for bathroom trips. 1 fall at night. no exercise or activity. takes care of 73yo ADLs Comments: difficult in last week, but typically independent     Extremity/Trunk Assessment   Upper Extremity Assessment Upper Extremity Assessment: Defer to OT evaluation;Overall WFL for tasks assessed (overal WFL in session but pt describes inability to lift grocery items multiple times, limited endurance)    Lower Extremity Assessment Lower Extremity Assessment: Overall WFL for tasks assessed (poor endurance)    Cervical / Trunk Assessment Cervical / Trunk Assessment: Normal  Communication   Communication Communication: No apparent difficulties Factors Affecting Communication: Hearing impaired    Cognition Arousal: Alert Behavior During Therapy: WFL for tasks assessed/performed   PT - Cognitive impairments: No apparent impairments                         Following commands: Intact       Cueing Cueing Techniques: Verbal cues     General Comments General comments (skin integrity, edema, etc.): VSS RA, Sp02 >96%    Exercises Other Exercises Other Exercises: sit-stand from recliner, 5x in 15.8 sec    Assessment/Plan    PT Assessment Patient needs continued PT services  PT Problem List Decreased strength;Decreased activity tolerance;Decreased balance;Decreased mobility       PT Treatment Interventions DME instruction;Gait training;Stair training;Functional mobility training;Therapeutic activities;Therapeutic exercise;Balance training;Patient/family education    PT Goals (Current goals can be found in the Care Plan section)  Acute Rehab PT Goals Patient Stated Goal: return home and be able to play with her 2yo grandson PT Goal Formulation: With patient Time For Goal Achievement: 09/12/23 Potential to Achieve Goals: Good Additional Goals Additional Goal #1: The pt will complete floor to chair transfer with supervision and 3/10 RPE to improve her ability to play on the floor with her 2 yo grandson.    Frequency Min 2X/week        AM-PAC PT 6 Clicks Mobility  Outcome Measure Help needed turning from your back to your side while in a flat bed without using bedrails?: None Help needed moving from lying on your back to sitting on the side of a flat bed without using bedrails?: None Help needed moving to and from a bed to a chair (including a wheelchair)?: A Little Help needed standing up from a chair using your arms (e.g., wheelchair or bedside chair)?: A Little Help needed to walk in hospital room?: A Little Help needed climbing 3-5 steps with a railing? : A Little 6 Click Score: 20  End of Session Equipment Utilized During Treatment: Gait belt Activity Tolerance: Patient tolerated treatment well Patient left: in bed;with call bell/phone within reach Nurse Communication: Mobility status PT Visit Diagnosis: Unsteadiness on feet (R26.81);Muscle weakness (generalized) (M62.81)    Time: 8473-8442 PT Time Calculation (min) (ACUTE ONLY): 31 min   Charges:   PT Evaluation $PT Eval Moderate Complexity: 1 Mod PT Treatments $Therapeutic Exercise: 8-22 mins PT General  Charges $$ ACUTE PT VISIT: 1 Visit         Izetta Call, PT, DPT   Acute Rehabilitation Department Office [phone] Secure Chat Communication Preferred  Izetta JULIANNA Call 08/29/2023, 5:54 PM

## 2023-08-29 NOTE — Plan of Care (Signed)
  Problem: Education: Goal: Knowledge of General Education information will improve Description Including pain rating scale, medication(s)/side effects and non-pharmacologic comfort measures Outcome: Progressing   Problem: Clinical Measurements: Goal: Will remain free from infection Outcome: Progressing Goal: Cardiovascular complication will be avoided Outcome: Progressing   Problem: Activity: Goal: Risk for activity intolerance will decrease Outcome: Progressing

## 2023-08-29 NOTE — Progress Notes (Signed)
 Rounding Note   Patient Name: Shannon [name] Date of Encounter: 08/29/2023  [facility] Cardiologist: None   Subjective Breathing still appears labored- negative 1.3L yesterday.  On 60 mg IV BID lasix  (given this am). Blood pressure remains elevated.  She c/o chest heaviness last night - it was persistent for hours yesterday. Trop here borderline flat elevated 26/24. NT-Pro BNP [id number]. Potassium 3.8 today. She appears to have converted to sinus rhythm yesterday.  Scheduled Meds:  apixaban   5 mg Oral BID   busPIRone   5 mg Oral BID   cholecalciferol   2,000 Units Oral Daily   empagliflozin   10 mg Oral Daily   fluticasone  furoate-vilanterol  1 puff Inhalation Daily   furosemide   60 mg Intravenous BID   irbesartan   150 mg Oral Daily   metoprolol  succinate  100 mg Oral Daily   multivitamin with minerals  1 tablet Oral QHS   simvastatin   20 mg Oral Daily   Continuous Infusions:   PRN Meds: acetaminophen  **OR** acetaminophen , levalbuterol , ondansetron  **OR** ondansetron  (ZOFRAN ) IV, mouth rinse   Vital Signs  Vitals:   08/29/23 0050 08/29/23 0501 08/29/23 0700 08/29/23 0805  BP:  (!) 171/98  (!) 186/84  Pulse: 61  66 72  Resp: (!) 23  19 19   Temp:  98.3 F (36.8 C)  98.9 F (37.2 C)  TempSrc:  Oral  Oral  SpO2: 93%  93% 95%  Weight:  83.4 kg    Height:        Intake/Output Summary (Last 24 hours) at 08/29/2023 0854 Last data filed at 08/29/2023 0506 Gross per 24 hour  Intake 397.81 ml  Output 1800 ml  Net -1402.19 ml      08/29/2023    5:01 AM 08/28/2023    3:10 AM 08/27/2023   10:54 PM  Last 3 Weights  Weight (lbs) 183 lb 13.8 oz 185 lb 6.5 oz 185 lb 13.6 oz  Weight (kg) 83.4 kg 84.1 kg 84.3 kg      Telemetry Sinus rhythm with short burst of NSVT or SVT w/aberrancy - personally reviewed  ECG  N/A  Physical Exam  VS:  BP (!) 186/84   Pulse 72   Temp 98.9 F (37.2 C) (Oral)   Resp 19   Ht 5' 7 (1.702 m)   Wt 83.4 kg   SpO2 95%   BMI  28.80 kg/m  , BMI Body mass index is 28.8 kg/m.  General appearance: alert and mildly dyspneic Lungs: diminished breath sounds bibasilar Heart: regular rate and rhythm Extremities: extremities normal, atraumatic, no cyanosis or edema Neurologic: Grossly normal Psych: Anxious  Labs High Sensitivity Troponin:   No results for input(s): TROPONINIHS in the last 720 hours.    Chemistry Recent Labs  Lab 08/27/23 1844 08/28/23 0221 08/29/23 0242  NA 139 144 139  K 3.5 3.4* 3.8  CL 101 104 104  CO2 24 29 28   GLUCOSE 120* 110* 106*  BUN 12 7* 14  CREATININE 0.79 0.71 0.99  CALCIUM  9.5 8.8* 9.0  MG  --   --  2.3  PROT  --  6.4*  --   ALBUMIN  --  3.0*  --   AST  --  22  --   ALT  --  20  --   ALKPHOS  --  54  --   BILITOT  --  1.4*  --   GFRNONAA >60 >60 >60  ANIONGAP 13 11 7     Lipids No results for  input(s): CHOL, TRIG, HDL, LABVLDL, LDLCALC, CHOLHDL in the last 168 hours.  Hematology Recent Labs  Lab 08/27/23 1844 08/28/23 0221  WBC 9.2 9.2  RBC 3.75* 3.56*  HGB 10.5* 10.1*  HCT 34.3* 33.2*  MCV 91.5 93.3  MCH 28.0 28.4  MCHC 30.6 30.4  RDW 17.1* 16.9*  PLT 347 345   Thyroid  No results for input(s): TSH, FREET4 in the last 168 hours.  BNP Recent Labs  Lab 08/27/23 1858  PROBNP 13,668.0*    DDimer No results for input(s): DDIMER in the last 168 hours.   Radiology  DG Chest Port 1 View Result Date: 08/27/2023 CLINICAL DATA:  Chest pain, hypertension EXAM: PORTABLE CHEST 1 VIEW COMPARISON:  07/30/2023 FINDINGS: Left pacer remains in place, unchanged. Mild cardiomegaly. Aortic atherosclerosis. No confluent airspace opacity, effusion or edema. Mild vascular congestion. IMPRESSION: Cardiomegaly, vascular congestion. Electronically Signed   By: Franky Crease M.D.   On: 08/27/2023 19:10   Cardiac Studies  Echo 07/07/23: 1. Left ventricular ejection fraction, by estimation, is 30 to 35%. The  left ventricle has moderately decreased function.  The left ventricle  demonstrates global hypokinesis. There is mild concentric left ventricular  hypertrophy. Left ventricular  diastolic function could not be evaluated.   2. Right ventricular systolic function is normal. The right ventricular  size is normal. Tricuspid regurgitation signal is inadequate for assessing  PA pressure.   3. Left atrial size was moderately dilated.   4. Right atrial size was moderately dilated.   5. The mitral valve is normal in structure. No evidence of mitral valve  regurgitation. No evidence of mitral stenosis.   6. The aortic valve is tricuspid. Aortic valve regurgitation is not  visualized. No aortic stenosis is present.   7. The inferior vena cava is normal in size with greater than 50%  respiratory variability, suggesting right atrial pressure of 3 mmHg.   Patient Profile   73 y.o. female with HFrEF, paroxysmal ablation s/p multiple ablations, SSS s/p PPM, anxiety and OSA admitted with hypertensive urgency.   Assessment & Plan   # Atrial fibrillation with RVR: Converted spontaneously to NSR yesterday. Had brief NSVT vs  SVT w/aberrancy. She is scheduled for AVN ablation 9/9.  Continue Eliquis .  She didn't tolerate several antiarrhythmics.   # HFrEF:   # Hypertensive urgency:  LVEF 30-35%.  Suboptimal diuresis yesterday- even though she had pro-BNP, it is high - although she does not appear grossly overloaded, she may be intravascularly overloaded. Still appears dyspneic today. Will give metolazone  2.5 mg x 1 now (just got 60 mg IV lasix ) -> increase to 80 mg IV BID starting tonight. Monitor output and renal function closely. Check BNP. May need to titrate anti-hypertensives, however, options are limited d/t medication intolerances.  # Demand ischemia: Hs-troponin mildly elevated and flat.    # Anxiety:  Definitely contributing.  She is seeing a counselor which is helpful.  Also strong faith.  Continue Buspar .   # Hyperlipidemia;  Continue  simvastatin .    For questions or updates, please contact [facility] Please consult [email or web address] for contact info under   Vinie KYM Maxcy, MD, [facility], FNLA, FACP  [place]  [facility]  Medical Director of the Advanced Lipid Disorders &  Cardiovascular Risk Reduction Clinic Diplomate of the American Board of Clinical Lipidology Attending Cardiologist  Direct Dial: [phone]  Fax: [phone]  Website:  [email or web address]  Vinie JAYSON Maxcy, MD  08/29/2023, 8:54 AM

## 2023-08-29 NOTE — Progress Notes (Signed)
 Progress Note   Patient: Shannon [name] FMW:[id number] DOB: 1950-01-11 DOA: 08/27/2023     2 DOS: the patient was seen and examined on 08/29/2023   Brief hospital course: Mrs. Haught was admitted to the hospital with the working diagnosis of heart failure exacerbation in the setting of atrial fibrillation with RVR.   73 yo female with the past medical history of heart failure, paroxysmal atrial fibrillation, who presented with dyspnea.  Reported worsening dyspnea on exertion and orthopnea. Her blood pressure has been high and was recently started on Olmesartan . On the day of admission she had severe dyspnea along with chest pain, prompting her to come to the hospital.  On her initial physical examination her blood pressure was 159/111, HR 129, RR 22 and 02 saturation 98%  Lungs with rales bilaterally with no wheezing, heart with S1 and S2 present, irregularly irregular with no gallops, positive JV,D,abdomen with no distention,  positive lower extremity edema.   Na 139, K 3.5 Cl 101 bicarbonate 24 glucose 120, bun 12 cr 0,79  BNP 13,688 High sensitive troponin 26 and 24  Wbc 9,2 hgb 10,5 plt 347   Chest radiograph with cardiomegaly, bilateral hilar vascular congestion, prominent right pulmonary artery, no effusions or infiltrates. 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   EKG 63 bpm, normal axis, normal intervals, qtc 475, atrial fibrillation rhythm with no ST segment changes, negative T wave lead II, III, aVF, V2 to V5.   Patient was placed on IV furosemide  and IV nitroglycerin .   08/22 continue volume overloaded and in atrial fibrillation with RVR.  Patient converted to sinus rhythm 08/23 continue diuresis   Assessment and Plan: * Acute on chronic systolic CHF (congestive heart failure) (HCC) Echocardiogram with reduced LV systolic function with EF 30 to 35%, global hypokinesis, RV systolic function preserved, LA and RA with moderate dilatation,    Documented urine output 1800 cc Systolic blood pressure 150 mmHg range.   Continue diuresis with furosemide  80 mg IV bid Metolazone  2,5 mg x1  Continue irbesartan  and metoprolol  SGLT 2 inh  Apparently she did not tolerate spironolactone  in the past.   Troponin high sensitive elevation due to atrial fibrillation and heart failure, no acute coronary syndrome.   Atrial fibrillation with RVR [facility]) Patient had PVI ablation, 2018, 2022, and 2023   Converted to sinus rhythm yesterday  Continue metoprolol  succinate 100 mg daily.  Patient did not tolerate antiarrhythmic therapy in the past. 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  Continue telemetry monitoring   Primary hypertension Uncontrolled hypertension  Plan to continue irbesartan  and metoprolol . Continue diuresis   Chronic obstructive pulmonary disease with acute exacerbation (HCC) No signs of acute exacerbation Continue bronchodilator therapy  Hypokalemia Today renal function with serum cr at 0,9 with K at 3,8 and serum bicarbonate at 28  Na 139 Mg 2.3   Plan to add Kcl 40 meq x1 and follow up renal function and electrolytes Avoid hypotension or nephrotoxic medications.   Polymyalgia rheumatica (HCC) Anxiety and depression  Continue buspirone    History of CVA (cerebrovascular accident) Continue anticoagulation for atrial fibrillation On statin therapy         Subjective: Patient with improvement in dyspnea and lower extremity edema, no chest pain,   Physical Exam: Vitals:   08/29/23 0501 08/29/23 0700 08/29/23 0805 08/29/23 1000  BP: (!) 171/98  (!) 186/84 (!) 150/79  Pulse:  66 72  72  Resp:  19 19 (!) 23  Temp: 98.3 F (36.8 C)  98.9 F (37.2 C) 98.9 F (37.2 C)  TempSrc: Oral  Oral Oral  SpO2:  93% 95%   Weight: 83.4 kg     Height:       Neurology awake and alert ENT with mild pallor Cardiovascular with S1 and S2 present and  regular, no murmurs Mild JVD Respiratory with rales at bases with no wheezing or rhonchi  Abdomen with no distention, non tender Lower extremity edema +  Data Reviewed:    Family Communication: no family at the bedside   Disposition: Status is: Inpatient Remains inpatient appropriate because: Iv diuresis   Planned Discharge Destination: Home     Author: Elidia Toribio Furnace, MD 08/29/2023 12:02 PM  For on call review [email or web address].

## 2023-08-30 DIAGNOSIS — I10 Essential (primary) hypertension: Secondary | ICD-10-CM | POA: Diagnosis not present

## 2023-08-30 DIAGNOSIS — E876 Hypokalemia: Secondary | ICD-10-CM | POA: Diagnosis not present

## 2023-08-30 DIAGNOSIS — J441 Chronic obstructive pulmonary disease with (acute) exacerbation: Secondary | ICD-10-CM | POA: Diagnosis not present

## 2023-08-30 DIAGNOSIS — I5023 Acute on chronic systolic (congestive) heart failure: Secondary | ICD-10-CM | POA: Diagnosis not present

## 2023-08-30 DIAGNOSIS — I4891 Unspecified atrial fibrillation: Secondary | ICD-10-CM | POA: Diagnosis not present

## 2023-08-30 LAB — BASIC METABOLIC PANEL WITH GFR
Anion gap: 13 (ref 5–15)
BUN: 17 mg/dL (ref 8–23)
CO2: 30 mmol/L (ref 22–32)
Calcium: 9.6 mg/dL (ref 8.9–10.3)
Chloride: 94 mmol/L — ABNORMAL LOW (ref 98–111)
Creatinine, Ser: 0.82 mg/dL (ref 0.44–1.00)
GFR, Estimated: >60 mL/min (ref >60)
Glucose, Bld: 91 mg/dL (ref 70–99)
Potassium: 2.9 mmol/L — ABNORMAL LOW (ref 3.5–5.1)
Sodium: 137 mmol/L (ref 135–145)

## 2023-08-30 LAB — BRAIN NATRIURETIC PEPTIDE: B Natriuretic Peptide: 491.8 pg/mL — ABNORMAL HIGH (ref 0.0–100.0)

## 2023-08-30 LAB — MAGNESIUM: Magnesium: 2.1 mg/dL (ref 1.7–2.4)

## 2023-08-30 MED ORDER — POTASSIUM CHLORIDE CRYS ER 20 MEQ PO TBCR
40.0000 meq | EXTENDED_RELEASE_TABLET | ORAL | Status: AC
  Administered 2023-08-30 (×2): 40 meq via ORAL
  Filled 2023-08-30 (×2): qty 2

## 2023-08-30 MED ORDER — POTASSIUM CHLORIDE CRYS ER 20 MEQ PO TBCR
40.0000 meq | EXTENDED_RELEASE_TABLET | Freq: Once | ORAL | Status: AC
  Administered 2023-08-30: 40 meq via ORAL
  Filled 2023-08-30: qty 2

## 2023-08-30 MED ORDER — IRBESARTAN 300 MG PO TABS
300.0000 mg | ORAL_TABLET | Freq: Every day | ORAL | Status: DC
  Administered 2023-08-30 – 2023-08-31 (×2): 300 mg via ORAL
  Filled 2023-08-30 (×2): qty 1

## 2023-08-30 NOTE — Plan of Care (Signed)
°  Problem: Education: °Goal: Knowledge of General Education information will improve °Description: Including pain rating scale, medication(s)/side effects and non-pharmacologic comfort measures °Outcome: Progressing °  °Problem: Clinical Measurements: °Goal: Diagnostic test results will improve °Outcome: Progressing °  °Problem: Clinical Measurements: °Goal: Respiratory complications will improve °Outcome: Progressing °  °

## 2023-08-30 NOTE — Progress Notes (Signed)
   08/30/23 2249  BiPAP/CPAP/SIPAP  BiPAP/CPAP/SIPAP Pt Type Adult  BiPAP/CPAP/SIPAP Resmed  Mask Type Nasal mask  Mask Size Small  IPAP 15 cmH20  EPAP 5 cmH2O  Patient Home Machine No  Patient Home Mask No  Patient Home Tubing No  CPAP/SIPAP surface wiped down Yes  Device Plugged into RED Power Outlet Yes  BiPAP/CPAP /SiPAP Vitals  Pulse Rate 72  Resp 20  SpO2 95 %  Bilateral Breath Sounds Clear;Diminished

## 2023-08-30 NOTE — Progress Notes (Signed)
 Rounding Note   Patient Name: Shannon [name] Date of Encounter: 08/30/2023  [facility] Cardiologist: None   Subjective Feels significantly better today- breathing is easier and BP is lower. Diuresed 4.4L negative overnight with metolazone  in addition to lasix . Creatinine improved to 0.82 today (from 0.99). Potassium, not suprisingly was low at 2.9 today, Magnesium  2.1. BNP now 491. Does have some leg cramps.  Scheduled Meds:  apixaban   5 mg Oral BID   busPIRone   5 mg Oral BID   cholecalciferol   2,000 Units Oral Daily   empagliflozin   10 mg Oral Daily   fluticasone  furoate-vilanterol  1 puff Inhalation Daily   furosemide   80 mg Intravenous BID   irbesartan   150 mg Oral Daily   metoprolol  succinate  100 mg Oral Daily   multivitamin with minerals  1 tablet Oral QHS   potassium chloride   40 mEq Oral Q4H   simvastatin   20 mg Oral Daily   Continuous Infusions:   PRN Meds: acetaminophen  **OR** acetaminophen , levalbuterol , ondansetron  **OR** ondansetron  (ZOFRAN ) IV, mouth rinse   Vital Signs  Vitals:   08/30/23 0130 08/30/23 0400 08/30/23 0600 08/30/23 0800  BP:  (!) 160/84 133/74 (!) 146/83  Pulse:      Resp: 20 12  20   Temp:  98 F (36.7 C)  98.2 F (36.8 C)  TempSrc:  Oral  Oral  SpO2:  97%  97%  Weight:   80.7 kg   Height:        Intake/Output Summary (Last 24 hours) at 08/30/2023 0915 Last data filed at 08/30/2023 0700 Gross per 24 hour  Intake 1110 ml  Output 5600 ml  Net -4490 ml      08/30/2023    6:00 AM 08/29/2023    5:01 AM 08/28/2023    3:10 AM  Last 3 Weights  Weight (lbs) 177 lb 14.6 oz 183 lb 13.8 oz 185 lb 6.5 oz  Weight (kg) 80.7 kg 83.4 kg 84.1 kg      Telemetry Sinus rhythm - personally reviewed  ECG  N/A  Physical Exam  VS:  BP (!) 146/83 (BP Location: Left Arm)   Pulse 66   Temp 98.2 F (36.8 C) (Oral)   Resp 20   Ht 5' 7 (1.702 m)   Wt 80.7 kg   SpO2 97%   BMI 27.86 kg/m  , BMI Body mass index is 27.86  kg/m.  General appearance: alert and in no distress Lungs: clear to auscultation bilaterally Heart: regular rate and rhythm Extremities: extremities normal, atraumatic, no cyanosis or edema Neurologic: Grossly normal Psych: Less anxious  Labs High Sensitivity Troponin:   No results for input(s): TROPONINIHS in the last 720 hours.    Chemistry Recent Labs  Lab 08/28/23 0221 08/29/23 0242 08/30/23 0219  NA 144 139 137  K 3.4* 3.8 2.9*  CL 104 104 94*  CO2 29 28 30   GLUCOSE 110* 106* 91  BUN 7* 14 17  CREATININE 0.71 0.99 0.82  CALCIUM  8.8* 9.0 9.6  MG  --  2.3 2.1  PROT 6.4*  --   --   ALBUMIN 3.0*  --   --   AST 22  --   --   ALT 20  --   --   ALKPHOS 54  --   --   BILITOT 1.4*  --   --   GFRNONAA >60 >60 >60  ANIONGAP 11 7 13     Lipids No results for input(s): CHOL, TRIG, HDL, LABVLDL,  LDLCALC, CHOLHDL in the last 168 hours.  Hematology Recent Labs  Lab 08/27/23 1844 08/28/23 0221  WBC 9.2 9.2  RBC 3.75* 3.56*  HGB 10.5* 10.1*  HCT 34.3* 33.2*  MCV 91.5 93.3  MCH 28.0 28.4  MCHC 30.6 30.4  RDW 17.1* 16.9*  PLT 347 345   Thyroid  No results for input(s): TSH, FREET4 in the last 168 hours.  BNP Recent Labs  Lab 08/27/23 1858 08/30/23 0219  BNP  --  491.8*  PROBNP 13,668.0*  --     DDimer No results for input(s): DDIMER in the last 168 hours.   Radiology  No results found.  Cardiac Studies  Echo 07/07/23: 1. Left ventricular ejection fraction, by estimation, is 30 to 35%. The  left ventricle has moderately decreased function. The left ventricle  demonstrates global hypokinesis. There is mild concentric left ventricular  hypertrophy. Left ventricular  diastolic function could not be evaluated.   2. Right ventricular systolic function is normal. The right ventricular  size is normal. Tricuspid regurgitation signal is inadequate for assessing  PA pressure.   3. Left atrial size was moderately dilated.   4. Right atrial size  was moderately dilated.   5. The mitral valve is normal in structure. No evidence of mitral valve  regurgitation. No evidence of mitral stenosis.   6. The aortic valve is tricuspid. Aortic valve regurgitation is not  visualized. No aortic stenosis is present.   7. The inferior vena cava is normal in size with greater than 50%  respiratory variability, suggesting right atrial pressure of 3 mmHg.   Patient Profile   73 y.o. female with HFrEF, paroxysmal ablation s/p multiple ablations, SSS s/p PPM, anxiety and OSA admitted with hypertensive urgency.   Assessment & Plan   # Atrial fibrillation with RVR: Converted spontaneously to NSR yesterday. Had brief NSVT vs  SVT w/aberrancy. She is scheduled for AVN ablation 9/9.  Continue Eliquis .  She didn't tolerate several antiarrhythmics.   # HFrEF:   # Hypertensive urgency:  LVEF 30-35%.  Was intravascularly volume overloaded with high pro-BNP - responded well to metolazone  and lasix , breathing has improved significantly as has her anxiety. Excellent response to metolazone  and lasix  yesterday, will continue lasix  80 mg IV BID today, probably consider converting to oral diuretic tomorrow Monitor output and renal function closely.  BNP 491 today (would equate to about 4900 pro-BNP, down from 13,668) Will increase irbesartan  to 300 mg daily today.  # Demand ischemia: Hs-troponin mildly elevated and flat.    # Anxiety:  Definitely contributing.  She is seeing a counselor which is helpful.  Also strong faith.  Continue Buspar .   # Hyperlipidemia;  Continue simvastatin .  # Hypokalemia: Potassium 2.9 from aggressive diuresis - replete to >4.0    For questions or updates, please contact [facility] Please consult [email or web address] for contact info under   Vinie KYM Maxcy, MD, [facility], FNLA, FACP  [place]  [facility]  Medical Director of the Advanced Lipid Disorders &  Cardiovascular Risk Reduction Clinic Diplomate of the  American Board of Clinical Lipidology Attending Cardiologist  Direct Dial: [phone]  Fax: [phone]  Website:  [email or web address]  Vinie JAYSON Maxcy, MD  08/30/2023, 9:15 AM

## 2023-08-30 NOTE — Progress Notes (Signed)
 Progress Note   Patient: Shannon [name] FMW:[id number] DOB: Oct 27, 1950 DOA: 08/27/2023     3 DOS: the patient was seen and examined on 08/30/2023   Brief hospital course: Shannon [name] was admitted to the hospital with the working diagnosis of heart failure exacerbation in the setting of atrial fibrillation with RVR.   73 yo female with the past medical history of heart failure, paroxysmal atrial fibrillation, who presented with dyspnea.  Reported worsening dyspnea on exertion and orthopnea. Her blood pressure has been high and was recently started on Olmesartan . On the day of admission she had severe dyspnea along with chest pain, prompting her to come to the hospital.  On her initial physical examination her blood pressure was 159/111, HR 129, RR 22 and 02 saturation 98%  Lungs with rales bilaterally with no wheezing, heart with S1 and S2 present, irregularly irregular with no gallops, positive JV,D,abdomen with no distention,  positive lower extremity edema.   Na 139, K 3.5 Cl 101 bicarbonate 24 glucose 120, bun 12 cr 0,79  BNP 13,688 High sensitive troponin 26 and 24  Wbc 9,2 hgb 10,5 plt 347   Chest radiograph with cardiomegaly, bilateral hilar vascular congestion, prominent right pulmonary artery, no effusions or infiltrates. 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   EKG 63 bpm, normal axis, normal intervals, qtc 475, atrial fibrillation rhythm with no ST segment changes, negative T wave lead II, III, aVF, V2 to V5.   Patient was placed on IV furosemide  and IV nitroglycerin .   08/22 continue volume overloaded and in atrial fibrillation with RVR.  Patient converted to sinus rhythm 08/23 continue diuresis   Assessment and Plan: * Acute on chronic systolic CHF (congestive heart failure) (HCC) Echocardiogram with reduced LV systolic function with EF 30 to 35%, global hypokinesis, RV systolic function preserved, LA and RA with moderate dilatation,    Documented urine output 5.600 cc Systolic blood pressure 150 mmHg range.   Continue diuresis with furosemide  80 mg IV bid 08/23 Metolazone  2,5 mg x1  Continue irbesartan  and metoprolol  SGLT 2 inh  Apparently she did not tolerate spironolactone  in the past.   Troponin high sensitive elevation due to atrial fibrillation and heart failure, no acute coronary syndrome.   Atrial fibrillation with RVR [facility]) Patient had PVI ablation, 2018, 2022, and 2023   08/22 Converted to sinus rhythm Continue metoprolol  succinate 100 mg daily.  Patient did not tolerate antiarrhythmic therapy in the past. Flecainide , Amiodarone   tikosin   Plan for AVN ablation on 09.09.25  Continue anticoagulation with apixaban .  Continue telemetry monitoring   Primary hypertension Uncontrolled hypertension  Plan to continue irbesartan  and metoprolol . Continue diuresis   Chronic obstructive pulmonary disease with acute exacerbation (HCC) No signs of acute exacerbation Continue bronchodilator therapy  Hypokalemia Renal function with serum cr at 0,82 with K at 2,9 and serum bicarbonate at 30  Na 137 and Mg 2.1   Plan to add Kcl 40 meq x3  Follow up renal function and electrolytes Avoid hypotension or nephrotoxic medications.   Polymyalgia rheumatica (HCC) Anxiety and depression  Continue buspirone    History of CVA (cerebrovascular accident) Continue anticoagulation for atrial fibrillation On statin therapy      Subjective: patient with significant improvement in her symptoms, edema and dyspnea, no PND or orthopnea. Positive cramps lower extremities and right upper extremity.   Physical Exam: Vitals:   08/30/23 0130 08/30/23 0400 08/30/23 0600 08/30/23 0800  BP:  (!) 160/84 133/74 ROLLEN)  146/83  Pulse:      Resp: 20 12  20   Temp:  98 F (36.7 C)  98.2 F (36.8 C)  TempSrc:  Oral  Oral  SpO2:  97%  97%  Weight:   80.7 kg   Height:       Neurology awake and alert ENT with mild  pallor Cardiovascular with S1 and S2 present and rhythmic with no gallops or rubs No JVD Respiratory with no rales or wheezing, no rhonchi  Abdomen with no distention  No lower extremity edema   Data Reviewed:    Family Communication: no family at the bedside   Disposition: Status is: Inpatient Remains inpatient appropriate because: IV diuresis   Planned Discharge Destination: Home     Author: Elidia Toribio Furnace, MD 08/30/2023 8:52 AM  For on call review [email or web address].

## 2023-08-31 ENCOUNTER — Ambulatory Visit (HOSPITAL_BASED_OUTPATIENT_CLINIC_OR_DEPARTMENT_OTHER): Admitting: Family Medicine

## 2023-08-31 ENCOUNTER — Other Ambulatory Visit (HOSPITAL_COMMUNITY): Payer: Self-pay

## 2023-08-31 ENCOUNTER — Telehealth: Payer: Self-pay | Admitting: Home Health

## 2023-08-31 ENCOUNTER — Telehealth (HOSPITAL_BASED_OUTPATIENT_CLINIC_OR_DEPARTMENT_OTHER): Payer: Self-pay | Admitting: *Deleted

## 2023-08-31 ENCOUNTER — Encounter (HOSPITAL_BASED_OUTPATIENT_CLINIC_OR_DEPARTMENT_OTHER): Payer: Self-pay

## 2023-08-31 DIAGNOSIS — I10 Essential (primary) hypertension: Secondary | ICD-10-CM | POA: Diagnosis not present

## 2023-08-31 DIAGNOSIS — I4891 Unspecified atrial fibrillation: Secondary | ICD-10-CM | POA: Diagnosis not present

## 2023-08-31 DIAGNOSIS — I5023 Acute on chronic systolic (congestive) heart failure: Secondary | ICD-10-CM | POA: Diagnosis not present

## 2023-08-31 DIAGNOSIS — J441 Chronic obstructive pulmonary disease with (acute) exacerbation: Secondary | ICD-10-CM | POA: Diagnosis not present

## 2023-08-31 LAB — BASIC METABOLIC PANEL WITH GFR
Anion gap: 10 (ref 5–15)
BUN: 23 mg/dL (ref 8–23)
CO2: 35 mmol/L — ABNORMAL HIGH (ref 22–32)
Calcium: 9.6 mg/dL (ref 8.9–10.3)
Chloride: 89 mmol/L — ABNORMAL LOW (ref 98–111)
Creatinine, Ser: 1.29 mg/dL — ABNORMAL HIGH (ref 0.44–1.00)
GFR, Estimated: 44 mL/min — ABNORMAL LOW (ref >60)
Glucose, Bld: 96 mg/dL (ref 70–99)
Potassium: 4 mmol/L (ref 3.5–5.1)
Sodium: 134 mmol/L — ABNORMAL LOW (ref 135–145)

## 2023-08-31 LAB — MAGNESIUM: Magnesium: 2 mg/dL (ref 1.7–2.4)

## 2023-08-31 LAB — BRAIN NATRIURETIC PEPTIDE: B Natriuretic Peptide: 127.8 pg/mL — ABNORMAL HIGH (ref 0.0–100.0)

## 2023-08-31 MED ORDER — FUROSEMIDE 20 MG PO TABS
40.0000 mg | ORAL_TABLET | Freq: Every day | ORAL | 0 refills | Status: DC
  Filled 2023-08-31: qty 120, 30d supply, fill #0

## 2023-08-31 MED ORDER — FUROSEMIDE 40 MG PO TABS: 40.0000 mg | ORAL_TABLET | Freq: Every day | ORAL | Status: DC

## 2023-08-31 MED ORDER — FUROSEMIDE 10 MG/ML IJ SOLN
80.0000 mg | Freq: Once | INTRAMUSCULAR | Status: AC
  Administered 2023-08-31: 80 mg via INTRAVENOUS
  Filled 2023-08-31: qty 8

## 2023-08-31 MED ORDER — POTASSIUM CHLORIDE CRYS ER 20 MEQ PO TBCR
20.0000 meq | EXTENDED_RELEASE_TABLET | Freq: Every day | ORAL | 0 refills | Status: DC
  Filled 2023-08-31: qty 30, 30d supply, fill #0

## 2023-08-31 MED ORDER — METOPROLOL SUCCINATE ER 100 MG PO TB24
100.0000 mg | ORAL_TABLET | Freq: Every day | ORAL | Status: AC
Start: 2023-08-31 — End: ?

## 2023-08-31 MED ORDER — OLMESARTAN MEDOXOMIL 20 MG PO TABS
40.0000 mg | ORAL_TABLET | Freq: Every day | ORAL | 0 refills | Status: DC
Start: 2023-08-31 — End: 2023-10-07
  Filled 2023-08-31: qty 60, 30d supply, fill #0

## 2023-08-31 NOTE — TOC Transition Note (Signed)
 Transition of Care [facility]) - Discharge Note   Patient Details  Name: Shannon [name] MRN: [id number] Date of Birth: 05-13-1950  Transition of Care [facility]) CM/SW Contact:  Waddell Barnie Rama, RN Phone Number: 08/31/2023, 2:08 PM   Clinical Narrative:    For dc today, she is set up with outpatient physical therapy. Daughter will transport her home.         Patient Goals and CMS Choice            Discharge Placement                       Discharge Plan and Services Additional resources added to the After Visit Summary for                                       Social Drivers of Health (SDOH) Interventions SDOH Screenings   Food Insecurity: No Food Insecurity (08/28/2023)  Housing: Low Risk  (08/28/2023)  Transportation Needs: No Transportation Needs (08/28/2023)  Utilities: Not At Risk (08/28/2023)  Alcohol Screen: Low Risk  (08/09/2023)  Depression (PHQ2-9): Low Risk  (08/13/2023)  Financial Resource Strain: Low Risk  (08/09/2023)  Recent Concern: Financial Resource Strain - Medium Risk (06/15/2023)  Physical Activity: Inactive (08/09/2023)  Social Connections: Socially Isolated (08/28/2023)  Stress: Stress Concern Present (08/09/2023)  Tobacco Use: Medium Risk (08/28/2023)  Health Literacy: Adequate Health Literacy (08/13/2023)     Readmission Risk Interventions    08/31/2023    1:57 PM  Readmission Risk Prevention Plan  Transportation Screening Complete  PCP or Specialist Appt within 3-5 Days Complete  HRI or Home Care Consult Complete  Palliative Care Screening Not Applicable  Medication Review (RN Care Manager) Complete

## 2023-08-31 NOTE — Plan of Care (Signed)
   Problem: Health Behavior/Discharge Planning: Goal: Ability to manage health-related needs will improve Outcome: Progressing   Problem: Clinical Measurements: Goal: Ability to maintain clinical measurements within normal limits will improve Outcome: Progressing   Problem: Clinical Measurements: Goal: Will remain free from infection Outcome: Progressing

## 2023-08-31 NOTE — Discharge Summary (Addendum)
 Physician Discharge Summary   Patient: Shannon [name] MRN: [id number] DOB: 10/23/1950  Admit date:     08/27/2023  Discharge date: 08/31/23  Discharge Physician: Elidia Sieving Faustine Tates   PCP: Knute [name], FNP   Recommendations at discharge:    Furosemide  was increased to 40 mg daily and instructions to increase to 40 mg bid in case of volume overload, weight increase 2 to 3 lbs in 24 hrs or 5 lbs in 7 days until weight back to baseline.  Increased olmesartan  to 40 mg for better blood pressure control Follow up renal function and electrolytes in 7 days as outpatient Follow up with [name] Knute FNP in 7 to 10 days Follow up with Cardiology as scheduled.   Discharge Diagnoses: Principal Problem:   Acute on chronic systolic CHF (congestive heart failure) (HCC) Active Problems:   Atrial fibrillation with RVR (HCC)   Primary hypertension   Chronic obstructive pulmonary disease with acute exacerbation (HCC)   Hypokalemia   Polymyalgia rheumatica (HCC)   History of CVA (cerebrovascular accident)  Resolved Problems:   * No resolved hospital problems. [facility] Course: Shannon [name] was admitted to the hospital with the working diagnosis of heart failure exacerbation in the setting of atrial fibrillation with RVR.   73 yo female with the past medical history of heart failure, paroxysmal atrial fibrillation, who presented with dyspnea.  Reported worsening dyspnea on exertion and orthopnea. Her blood pressure has been high and was recently started on Olmesartan . On the day of admission she had severe dyspnea along with chest pain, prompting her to come to the hospital.  On her initial physical examination her blood pressure was 159/111, HR 129, RR 22 and 02 saturation 98%  Lungs with rales bilaterally with no wheezing, heart with S1 and S2 present, irregularly irregular with no gallops, positive JV,D,abdomen with no distention,  positive lower extremity edema.   Na  139, K 3.5 Cl 101 bicarbonate 24 glucose 120, bun 12 cr 0,79  BNP 13,688 High sensitive troponin 26 and 24  Wbc 9,2 hgb 10,5 plt 347   Chest radiograph with cardiomegaly, bilateral hilar vascular congestion, prominent right pulmonary artery, no effusions or infiltrates. 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   EKG 63 bpm, normal axis, normal intervals, qtc 475, atrial fibrillation rhythm with no ST segment changes, negative T wave lead II, III, aVF, V2 to V5.   Patient was placed on IV furosemide  and IV nitroglycerin .   08/22 continue volume overloaded and in atrial fibrillation with RVR.  Patient converted to sinus rhythm 08/23 continue diuresis  08/24 clinically with euvolemic state, plan for continue with oral loop diuretic at home.   Assessment and Plan: * Acute on chronic systolic CHF (congestive heart failure) (HCC) Echocardiogram with reduced LV systolic function with EF 30 to 35%, global hypokinesis, RV systolic function preserved, LA and RA with moderate dilatation,   Patient was placed on IV furosemide  for diuresis, negative fluid balance was achieved, - 9,337 ml with significant improvement in her symptoms   08/23 Metolazone  2,5 mg x1  Continue irbesartan  and metoprolol  SGLT 2 inh  Continue diuresis with furosemide  40 mg daily and increase to bid in case of volume overload.  Apparently she did not tolerate spironolactone  in the past.   Troponin high sensitive elevation due to atrial fibrillation and heart failure, no acute coronary syndrome.   Atrial fibrillation with RVR (HCC) Patient had PVI ablation, 2018, 2022, and  2023   08/22 Converted to sinus rhythm Continue metoprolol  succinate 100 mg daily.  Patient did not tolerate antiarrhythmic therapy in the past. Flecainide , Amiodarone   tikosin   Plan for AVN ablation on 09.09.25  Continue anticoagulation with apixaban .  Primary hypertension Uncontrolled hypertension  Plan to  continue with metoprolol , she received irbesartan  300 mg in the hospital.  She will continue olmesartan  at discharge at increased dose.   Chronic obstructive pulmonary disease with acute exacerbation (HCC) No signs of acute exacerbation Continue bronchodilator therapy  Hypokalemia Hyponatremia. AKI    At the time of discharge her renal function has a serum cr at 1,29 with K at 4,0 and serum bicarbonate at 35  Na 134 and Mg 2.0   Plan to continue diuresis with furosemide  at home Take Kcl with loop diuretic.  Follow up renal function and electrolytes as outpatient.   Polymyalgia rheumatica (HCC) Anxiety and depression  Continue buspirone    History of CVA (cerebrovascular accident) Continue anticoagulation for atrial fibrillation On statin therapy       Consultants: cardiology  Procedures performed: none   Disposition: Home Diet recommendation:  Cardiac diet DISCHARGE MEDICATION: Allergies as of 08/31/2023       Reactions   Definity  [perflutren  Lipid Microsphere] Other (See Comments)   Had unresponsive episode with concerns on allergic reaction. AVOID.   Ivp Dye [iodinated Contrast Media] Shortness Of Breath   Spironolactone  Shortness Of Breath   Sulfur Nausea And Vomiting   Ace Inhibitors Cough   Amiodarone  Other (See Comments)   Intolerance multiple side effects   Entresto  [sacubitril -valsartan ]    Discontinued per Dr. Cindie due to severe hypertension   Hctz [hydrochlorothiazide ] Other (See Comments)   Caused drug-induced LUPUS   Oxycodone Other (See Comments)   Hallucinations   Prednisone  Other (See Comments)   Made patient very aggressive   Sulfa Antibiotics Nausea And Vomiting   Symbicort  [budesonide -formoterol  Fumarate]    BP crashed directed to discontinue per MD   Voltaren [diclofenac Sodium] Other (See Comments)   Made patient become aggressive        Medication List     STOP taking these medications    loratadine 10 MG tablet Commonly  known as: CLARITIN   potassium chloride  20 MEQ packet Commonly known as: KLOR-CON  Replaced by: potassium chloride  SA 20 MEQ tablet       TAKE these medications    acetaminophen  500 MG tablet Commonly known as: TYLENOL  Take 1,000 mg by mouth every 6 (six) hours as needed for moderate pain or headache.   busPIRone  5 MG tablet Commonly known as: BUSPAR  Take 1 tablet (5 mg total) by mouth 2 (two) times daily.   CORTIZONE-10 EX Apply 1 application  topically as needed (skin irritation/itching).   Eliquis  5 MG Tabs tablet Generic drug: apixaban  TAKE 1 TABLET BY MOUTH TWICE  DAILY   empagliflozin  10 MG Tabs tablet Commonly known as: Jardiance  Take 1 tablet (10 mg total) by mouth daily before breakfast.   furosemide  20 MG tablet Commonly known as: LASIX  Take 2 tablets (40 mg total) by mouth daily. In case of weight gain 2 to 3 lbs in 24 hrs or 4 lbs in 7 days take 40 mg twice daily until weight back to baseline Start taking on: September 01, 2023 What changed:  how much to take additional instructions   levalbuterol  45 MCG/ACT inhaler Commonly known as: Xopenex  HFA Inhale 2 puffs into the lungs every 4 (four) hours as needed for wheezing.  metoprolol  succinate 100 MG 24 hr tablet Commonly known as: TOPROL -XL Take 1 tablet (100 mg total) by mouth daily. Take with or immediately following a meal. What changed: when to take this   multivitamin tablet Take 1 tablet by mouth at bedtime.   olmesartan  20 MG tablet Commonly known as: BENICAR  Take 2 tablets (40 mg total) by mouth daily. What changed: how much to take   potassium chloride  SA 20 MEQ tablet Commonly known as: KLOR-CON  M Take 1 tablet (20 mEq total) by mouth daily. Take with furosemide . Replaces: potassium chloride  20 MEQ packet   simvastatin  20 MG tablet Commonly known as: ZOCOR  TAKE 1 TABLET BY MOUTH DAILY   Vitamin D  50 MCG (2000 UT) Caps Take 2,000 Units by mouth in the morning.        Discharge  Exam: Filed Weights   08/29/23 0501 08/30/23 0600 08/31/23 0523  Weight: 83.4 kg 80.7 kg 81 kg   BP 109/67 (BP Location: Left Arm)   Pulse 70   Temp 97.6 F (36.4 C) (Oral)   Resp 16   Ht 5' 7 (1.702 m)   Wt 81 kg   SpO2 92%   BMI 27.97 kg/m   Patient is feeling well, no chest pain and no dyspnea, no palpitations, no PND or orthopnea, had mild dizziness this morning   Neurology awake and alert,  ENT with mild pallor with no icterus Cardiovascular with S1 and S2 present and regular with no gallops, or rubs, no murmurs No JVD Respiratory with no rales or wheezing, no rhonchi  Abdomen with no distention, non tender No lower extremity edema   Condition at discharge: stable  The results of significant diagnostics from this hospitalization (including imaging, microbiology, ancillary and laboratory) are listed below for reference.   Imaging Studies: DG Chest Port 1 View Result Date: 08/27/2023 CLINICAL DATA:  Chest pain, hypertension EXAM: PORTABLE CHEST 1 VIEW COMPARISON:  07/30/2023 FINDINGS: Left pacer remains in place, unchanged. Mild cardiomegaly. Aortic atherosclerosis. No confluent airspace opacity, effusion or edema. Mild vascular congestion. IMPRESSION: Cardiomegaly, vascular congestion. Electronically Signed   By: Franky Crease M.D.   On: 08/27/2023 19:10   CUP PACEART INCLINIC DEVICE CHECK Result Date: 08/13/2023 Normal in-clinic _dual__ chamber pacemaker check for arrhythmia burden. Presenting Rhythm: _AP/VP__ . Auto testing of thresholds, sensing, and impedance demonstrate stable parameters and no programming changes needed at this time. No new AF episodes since her hospital stay. Estimated longevity _13.4 years___ . Pt enrolled in remote follow-up.   Microbiology: Results for orders placed or performed during the hospital encounter of 07/01/23  Resp panel by RT-PCR (RSV, Flu A&B, Covid) Anterior Nasal Swab     Status: None   Collection Time: 07/01/23  2:45 PM    Specimen: Anterior Nasal Swab  Result Value Ref Range Status   SARS Coronavirus 2 by RT PCR NEGATIVE NEGATIVE Final    Comment: (NOTE) SARS-CoV-2 target nucleic acids are NOT DETECTED.  The SARS-CoV-2 RNA is generally detectable in upper respiratory specimens during the acute phase of infection. The lowest concentration of SARS-CoV-2 viral copies this assay can detect is 138 copies/mL. A negative result does not preclude SARS-Cov-2 infection and should not be used as the sole basis for treatment or other patient management decisions. A negative result may occur with  improper specimen collection/handling, submission of specimen other than nasopharyngeal swab, presence of viral mutation(s) within the areas targeted by this assay, and inadequate number of viral copies(<138 copies/mL). A negative result must  be combined with clinical observations, patient history, and epidemiological information. The expected result is Negative.  Fact Sheet for Patients:  BloggerCourse.com  Fact Sheet for Healthcare Providers:  SeriousBroker.it  This test is no t yet approved or cleared by the [place]  FDA and  has been authorized for detection and/or diagnosis of SARS-CoV-2 by FDA under an Emergency Use Authorization (EUA). This EUA will remain  in effect (meaning this test can be used) for the duration of the COVID-19 declaration under Section 564(b)(1) of the Act, 21 U.S.C.section 360bbb-3(b)(1), unless the authorization is terminated  or revoked sooner.       Influenza A by PCR NEGATIVE NEGATIVE Final   Influenza B by PCR NEGATIVE NEGATIVE Final    Comment: (NOTE) The Xpert Xpress SARS-CoV-2/FLU/RSV plus assay is intended as an aid in the diagnosis of influenza from Nasopharyngeal swab specimens and should not be used as a sole basis for treatment. Nasal washings and aspirates are unacceptable for Xpert Xpress  SARS-CoV-2/FLU/RSV testing.  Fact Sheet for Patients: BloggerCourse.com  Fact Sheet for Healthcare Providers: SeriousBroker.it  This test is not yet approved or cleared by the [place]  FDA and has been authorized for detection and/or diagnosis of SARS-CoV-2 by FDA under an Emergency Use Authorization (EUA). This EUA will remain in effect (meaning this test can be used) for the duration of the COVID-19 declaration under Section 564(b)(1) of the Act, 21 U.S.C. section 360bbb-3(b)(1), unless the authorization is terminated or revoked.     Resp Syncytial Virus by PCR NEGATIVE NEGATIVE Final    Comment: (NOTE) Fact Sheet for Patients: BloggerCourse.com  Fact Sheet for Healthcare Providers: SeriousBroker.it  This test is not yet approved or cleared by the [place]  FDA and has been authorized for detection and/or diagnosis of SARS-CoV-2 by FDA under an Emergency Use Authorization (EUA). This EUA will remain in effect (meaning this test can be used) for the duration of the COVID-19 declaration under Section 564(b)(1) of the Act, 21 U.S.C. section 360bbb-3(b)(1), unless the authorization is terminated or revoked.  Performed at [facility], [address] [place], [place] [address]    *Note: Due to a large number of results and/or encounters for the requested time period, some results have not been displayed. A complete set of results can be found in Results Review.    Labs: CBC: Recent Labs  Lab 08/27/23 1844 08/28/23 0221  WBC 9.2 9.2  HGB 10.5* 10.1*  HCT 34.3* 33.2*  MCV 91.5 93.3  PLT 347 345   Basic Metabolic Panel: Recent Labs  Lab 08/27/23 1844 08/28/23 0221 08/29/23 0242 08/30/23 0219 08/31/23 0221  NA 139 144 139 137 134*  K 3.5 3.4* 3.8 2.9* 4.0  CL 101 104 104 94* 89*  CO2 24 29 28 30  35*  GLUCOSE 120* 110* 106* 91 96   BUN 12 7* 14 17 23   CREATININE 0.79 0.71 0.99 0.82 1.29*  CALCIUM  9.5 8.8* 9.0 9.6 9.6  MG  --   --  2.3 2.1 2.0   Liver Function Tests: Recent Labs  Lab 08/28/23 0221  AST 22  ALT 20  ALKPHOS 54  BILITOT 1.4*  PROT 6.4*  ALBUMIN 3.0*   CBG: No results for input(s): GLUCAP in the last 168 hours.  Discharge time spent: greater than 30 minutes.  Signed: Elidia Toribio Furnace, MD [facility] 08/31/2023

## 2023-08-31 NOTE — Telephone Encounter (Signed)
 Patient called after-hours line reporting that she just received her lab results on MyChart from this afternoon showing elevated creatinine 1.29 and GFR 44.  She states she feels overall poor but without specific complaints.  She is wondering what she should do about her abnormal kidney results.   Reviewed today's discharge summary from internal medicine as well as cardiology note from Dr. Arnetha, both team had reviewed her BMP results before discharge, aware of her elevation of creatinine, transition her to p.o. Lasix .  Advised patient that her BMP had been reviewed before discharge, she should continue taking p.o. Lasix  for heart failure, she should follow-up with her PCP within 1 week and repeat lab work BMP to reassess renal function given that she is on Lasix  and multiple GDMT affecting kidney.  If her renal index back to normal baseline, there is no further change of plan; if her renal index worsen suggesting AKI or if she feels worse over time without improvement, she should return to ER for further evaluation.  Patient verbalized understanding.

## 2023-08-31 NOTE — Procedures (Signed)
 Progress Note  Patient Name: Shannon [name] Date of Encounter: 08/31/2023 Primary Cardiologist: Annabella Scarce, MD   Subjective   [name] is a  73 year old with atrial fibrillation and heart failure who presents with concerns about her current symptoms and medication management.  She has a history of atrial fibrillation, heart failure, sick sinus syndrome, and obstructive sleep apnea. She is status post pacemaker implantation and has undergone ablation for atrial fibrillation. She has experienced paroxysmal atrial fibrillation with rapid ventricular response and has had episodes of paroxysmal supraventricular tachycardia, as well as couplets and triplets of premature ventricular contractions.  She has a history of type two myocardial infarction and demand ischemia in the setting of rapid ventricular response. She has been managed with various medications including dofetilide , flecainide , and amiodarone , but has experienced side effects with these treatments. She has also experienced hypotension on Entresto  and significant fatigue on spironolactone . Currently, she is on irbesartan  300 mg, metoprolol  succinate 100 mg daily, Jardiance  10 mg, and simvastatin  for hyperlipidemia.  She felt 'great' yesterday but is experiencing more shortness of breath today and has concerns about going home. She has significant anxiety related to her condition.  Her laboratory evaluations show an increase in creatinine to 1.29, potassium at 4, and an improvement in BNP from 491 to 127.  Vital Signs    Vitals:   08/30/23 2249 08/30/23 2306 08/31/23 0523 08/31/23 0803  BP:  (!) 142/80 (!) 149/80 134/75  Pulse: 72 72 73 85  Resp: 20 (!) 21 17   Temp:  98.1 F (36.7 C) 97.7 F (36.5 C) 98.2 F (36.8 C)  TempSrc:  Oral Oral Oral  SpO2: 95% 96% 92% 95%  Weight:   81 kg   Height:        Intake/Output Summary (Last 24 hours) at 08/31/2023 0904 Last data filed at 08/31/2023 0526 Gross per 24 hour   Intake 120 ml  Output 3300 ml  Net -3180 ml   Filed Weights   08/29/23 0501 08/30/23 0600 08/31/23 0523  Weight: 83.4 kg 80.7 kg 81 kg    Physical Exam   GEN: No acute distress.   Neck: No JVD Cardiac: RRR, no murmurs, rubs, or gallops.  Respiratory: Clear to auscultation bilaterally but decreased breath sounds in bases with no crackles GI: Soft, nontender, non-distended  MS: No edema  Labs   Telemetry: SR with runs of P-SVT and PVCs LABS Creatinine: 1.29 mg/dL (91/74/7974) Potassium: 4.0 mmol/L (08/31/2023) BNP: 127 pg/mL (08/31/2023)   Chemistry Recent Labs  Lab 08/28/23 0221 08/29/23 0242 08/30/23 0219 08/31/23 0221  NA 144 139 137 134*  K 3.4* 3.8 2.9* 4.0  CL 104 104 94* 89*  CO2 29 28 30  35*  GLUCOSE 110* 106* 91 96  BUN 7* 14 17 23   CREATININE 0.71 0.99 0.82 1.29*  CALCIUM  8.8* 9.0 9.6 9.6  PROT 6.4*  --   --   --   ALBUMIN 3.0*  --   --   --   AST 22  --   --   --   ALT 20  --   --   --   ALKPHOS 54  --   --   --   BILITOT 1.4*  --   --   --   GFRNONAA >60 >60 >60 44*  ANIONGAP 11 7 13 10      Hematology Recent Labs  Lab 08/27/23 1844 08/28/23 0221  WBC 9.2 9.2  RBC 3.75* 3.56*  HGB  10.5* 10.1*  HCT 34.3* 33.2*  MCV 91.5 93.3  MCH 28.0 28.4  MCHC 30.6 30.4  RDW 17.1* 16.9*  PLT 347 345      BNP Recent Labs  Lab 08/27/23 1858 08/30/23 0219 08/31/23 0221  BNP  --  491.8* 127.8*  PROBNP 13,668.0*  --   --       Cardiac Studies   Cardiac Studies & Procedures   ______________________________________________________________________________________________   STRESS TESTS  NM PET CT CARDIAC PERFUSION MULTI W/ABSOLUTE BLOODFLOW 06/17/2023  Narrative   LV perfusion is normal. There is no evidence of ischemia. There is no evidence of infarction.   Rest left ventricular function is abnormal. Rest global function is moderately reduced. There were no regional wall motion abnormalities. Rest EF: 38%. Stress left ventricular function  is abnormal. Stress global function is mildly reduced. There were no regional wall abnormalities. Stress EF: 45%. End diastolic cavity size is moderately enlarged. End systolic cavity size is moderately enlarged.   Myocardial blood flow was computed to be 0.14ml/g/min at rest and 1.25ml/g/min at stress. Global myocardial blood flow reserve was 1.77 and was mildly abnormal.   Coronary calcium  was present on the attenuation correction CT images. Moderate coronary calcifications were present. Coronary calcifications were present in the left anterior descending artery and left circumflex artery distribution(s).   Findings are consistent with no ischemia and no infarction. The study is low risk.   Abnormal resting/stress EF suggest echo correlation. Normal perfusion but abnormal MBFR may represent microvascular dx or somewhat high resting flow  CLINICAL DATA:  This over-read does not include interpretation of cardiac or coronary anatomy or pathology. No interpretation the PET data set. The cardiac PET-CT interpretation by the cardiologist is attached.  COMPARISON:  None Available.  FINDINGS: Limited view of the lung parenchyma demonstrates no suspicious nodularity. Airways are normal.  Limited view of the mediastinum demonstrates no adenopathy. Esophagus normal.  Limited view of the upper abdomen unremarkable.  Limited view of the skeleton and chest wall is unremarkable.  IMPRESSION: No significant extracardiac findings.   Electronically Signed By: Jackquline Boxer M.D. On: 06/17/2023 14:09   ECHOCARDIOGRAM  ECHOCARDIOGRAM COMPLETE 07/07/2023  Narrative ECHOCARDIOGRAM REPORT    Patient Name:   Shannon [name] Date of Exam: 07/07/2023 Medical Rec #:  [id number]             Height:       67.0 in Accession #:    [id number]            Weight:       210.0 lb Date of Birth:  15-Apr-1950              BSA:          2.065 m Patient Age:    73 years              BP:           146/105  mmHg Patient Gender: F                     HR:           126 bpm. Exam Location:  Inpatient  Procedure: 2D Echo, Cardiac Doppler, Color Doppler and Intracardiac Opacification Agent (Both Spectral and Color Flow Doppler were utilized during procedure).  Indications:    Atrial Fibrillation I48.91  History:        Patient has prior history of Echocardiogram examinations, most recent 10/23/2022. CHF, Stroke, Arrythmias:Atrial Fibrillation, Atrial  Flutter, Tachycardia and Bradycardia, Signs/Symptoms:Hypotension and Shortness of Breath; Risk Factors:Dyslipidemia, Hypertension and Sleep Apnea.  Sonographer:    Thea Norlander RCS Referring Phys: [id number] CHRISTOPHER L SCHUMANN  IMPRESSIONS   1. Left ventricular ejection fraction, by estimation, is 30 to 35%. The left ventricle has moderately decreased function. The left ventricle demonstrates global hypokinesis. There is mild concentric left ventricular hypertrophy. Left ventricular diastolic function could not be evaluated. 2. Right ventricular systolic function is normal. The right ventricular size is normal. Tricuspid regurgitation signal is inadequate for assessing PA pressure. 3. Left atrial size was moderately dilated. 4. Right atrial size was moderately dilated. 5. The mitral valve is normal in structure. No evidence of mitral valve regurgitation. No evidence of mitral stenosis. 6. The aortic valve is tricuspid. Aortic valve regurgitation is not visualized. No aortic stenosis is present. 7. The inferior vena cava is normal in size with greater than 50% respiratory variability, suggesting right atrial pressure of 3 mmHg.  Comparison(s): Prior images reviewed side by side. The left ventricular function is significantly worse. This may be due to uncontrolled tachyarrhythmia.  FINDINGS Left Ventricle: Left ventricular ejection fraction, by estimation, is 30 to 35%. The left ventricle has moderately decreased function. The left ventricle  demonstrates global hypokinesis. Definity  contrast agent was given IV to delineate the left ventricular endocardial borders. The left ventricular internal cavity size was normal in size. There is mild concentric left ventricular hypertrophy. Left ventricular diastolic function could not be evaluated due to atrial fibrillation. Left ventricular diastolic function could not be evaluated.  Right Ventricle: The right ventricular size is normal. Right vetricular wall thickness was not well visualized. Right ventricular systolic function is normal. Tricuspid regurgitation signal is inadequate for assessing PA pressure.  Left Atrium: Left atrial size was moderately dilated.  Right Atrium: Right atrial size was moderately dilated.  Pericardium: There is no evidence of pericardial effusion.  Mitral Valve: The mitral valve is normal in structure. Mild mitral annular calcification. No evidence of mitral valve regurgitation. No evidence of mitral valve stenosis.  Tricuspid Valve: The tricuspid valve is grossly normal. Tricuspid valve regurgitation is not demonstrated.  Aortic Valve: The aortic valve is tricuspid. Aortic valve regurgitation is not visualized. No aortic stenosis is present. Aortic valve peak gradient measures 4.7 mmHg.  Pulmonic Valve: The pulmonic valve was grossly normal. Pulmonic valve regurgitation is not visualized. No evidence of pulmonic stenosis.  Aorta: The aortic root is normal in size and structure.  Venous: The inferior vena cava is normal in size with greater than 50% respiratory variability, suggesting right atrial pressure of 3 mmHg.  IAS/Shunts: The interatrial septum was not assessed.   LEFT VENTRICLE PLAX 2D LVIDd:         5.10 cm LVIDs:         3.90 cm LV PW:         1.20 cm LV IVS:        1.10 cm LVOT diam:     2.20 cm LV SV:         33 LV SV Index:   16 LVOT Area:     3.80 cm   RIGHT VENTRICLE             IVC RV S prime:     14.00 cm/s  IVC diam: 1.50  cm TAPSE (M-mode): 2.5 cm  LEFT ATRIUM             Index        RIGHT ATRIUM  Index LA diam:        5.00 cm 2.42 cm/m   RA Area:     22.50 cm LA Vol (A2C):   72.5 ml 35.12 ml/m  RA Volume:   70.60 ml  34.20 ml/m LA Vol (A4C):   59.5 ml 28.82 ml/m LA Biplane Vol: 65.2 ml 31.58 ml/m AORTIC VALVE AV Area (Vmax): 2.22 cm AV Vmax:        108.00 cm/s AV Peak Grad:   4.7 mmHg LVOT Vmax:      62.93 cm/s LVOT Vmean:     41.267 cm/s LVOT VTI:       0.087 m  AORTA Ao Root diam: 3.20 cm Ao Asc diam:  3.80 cm  MITRAL VALVE MV Area (PHT): 4.96 cm    SHUNTS MV Decel Time: 153 msec    Systemic VTI:  0.09 m MV E velocity: 69.90 cm/s  Systemic Diam: 2.20 cm  Jerel Croitoru MD Electronically signed by Jerel Balding MD Signature Date/Time: 07/07/2023/5:01:11 PM    Final   TEE  ECHO TEE 03/24/2016  Narrative *[place]* *[facility]* [address] [place], [place] [address] [phone]  ------------------------------------------------------------------- Transesophageal Echocardiography  Patient:    Magdalina, Whitehead MR #:       [id number] Study Date: 03/24/2016 Gender:     F Age:        89 Height:     170.2 cm Weight:     98.2 kg BSA:        2.19 m^2 Pt. Status: Room:  ADMITTING    Jerel Balding, MD ATTENDING    Jerel Balding, MD PERFORMING   Jerel Balding, MD SONOGRAPHER  [facility] ORDERING     [place], Amber BARTON Arm, Amber  cc:  ------------------------------------------------------------------- LV EF: 55% -   60%  ------------------------------------------------------------------- Indications:      Atrial fibrillation - 427.31.  ------------------------------------------------------------------- History:   PMH:   Congestive heart failure.  Risk factors:  Former tobacco use. Hypertension.  ------------------------------------------------------------------- Study Conclusions  - Left ventricle: There was  mild concentric hypertrophy. Systolic function was normal. The estimated ejection fraction was in the range of 55% to 60%. Wall motion was normal; there were no regional wall motion abnormalities. There was a reduced contribution of atrial contraction to ventricular filling, due to increased ventricular diastolic pressure or atrial contractile dysfunction. - Aortic valve: There was trivial regurgitation. - Mitral valve: There was mild to moderate regurgitation directed centrally. - Left atrium: The atrium was mildly to moderately dilated. No evidence of thrombus in the atrial cavity or appendage. - Right atrium: No evidence of thrombus in the atrial cavity or appendage.  ------------------------------------------------------------------- Study data:   Study status:  Routine.  Consent:  The risks, benefits, and alternatives to the procedure were explained to the patient and informed consent was obtained.  Procedure:  The patient reported no pain pre or post test. Initial setup. The patient was brought to the laboratory. Surface ECG leads were monitored. Sedation. Conscious sedation was administered by cardiology staff. Transesophageal echocardiography. An adult multiplane transesophageal probe was inserted by the attending cardiologistwithout difficulty. Image quality was adequate.  Study completion:  The patient tolerated the procedure well. There were no complications.  Administered medications:   Fentanyl , 37.5mcg, IV.  Midazolam , 3mg , IV.          Diagnostic transesophageal echocardiography.  2D and color Doppler.  Birthdate:  Patient birthdate: [birth year].  Age:  Patient is 73 yr old.  Sex:  Gender: female.    BMI: 33.9 kg/m^2.  Blood pressure:     165/78  Patient status:  Inpatient.  Study date:  Study date: 03/24/2016. Study time: 08:01 AM.  Location:   Endoscopy.  -------------------------------------------------------------------  ------------------------------------------------------------------- Left ventricle:  There was mild concentric hypertrophy. Systolic function was normal. The estimated ejection fraction was in the range of 55% to 60%. Wall motion was normal; there were no regional wall motion abnormalities. There was a reduced contribution of atrial contraction to ventricular filling, due to increased ventricular diastolic pressure or atrial contractile dysfunction.  ------------------------------------------------------------------- Aortic valve:   Structurally normal valve. Trileaflet; normal thickness leaflets. Cusp separation was normal.  Doppler:  There was trivial regurgitation.  ------------------------------------------------------------------- Aorta:  There was no atheroma. There was no evidence for dissection. Aortic root: The aortic root was not dilated. Ascending aorta: The ascending aorta was normal in size. Aortic arch: The aortic arch was normal in size. Descending aorta: The descending aorta was normal in size.  ------------------------------------------------------------------- Mitral valve:   Structurally normal valve.   Leaflet separation was normal.  Doppler:  There was mild to moderate regurgitation directed centrally.  ------------------------------------------------------------------- Left atrium:  The atrium was mildly to moderately dilated.  No evidence of thrombus in the atrial cavity or appendage. The appendage was morphologically a left appendage, multilobulated, and of normal size. Emptying velocity was normal.  ------------------------------------------------------------------- Right ventricle:  The cavity size was normal. Wall thickness was normal. Systolic function was normal.  ------------------------------------------------------------------- Pulmonic valve:    Structurally normal  valve.  ------------------------------------------------------------------- Tricuspid valve:   Structurally normal valve.   Leaflet separation was normal.  Doppler:  There was no significant regurgitation.  ------------------------------------------------------------------- Pulmonary artery:   The main pulmonary artery was normal-sized.  ------------------------------------------------------------------- Right atrium:  The atrium was normal in size.  No evidence of thrombus in the atrial cavity or appendage. The appendage was morphologically a right appendage.  ------------------------------------------------------------------- Pericardium:  There was no pericardial effusion.  ------------------------------------------------------------------- Measurements  Left ventricle                Value    Reference LV PW thickness, ED           15.4  mm --------- IVS/LV PW ratio, ED           0.92     <=1.3  Ventricular septum            Value    Reference IVS thickness, ED             14.22 mm ---------  Legend: (L)  and  (H)  mark values outside specified reference range.  ------------------------------------------------------------------- Prepared and Electronically Authenticated by  Jerel Balding, MD 2018-03-19T12:21:50        ______________________________________________________________________________________________          Assessment & Plan    Acute on chronic Heart failure with reduced ejection fraction  - Chronic heart failure with reduced ejection fraction and hypertension. Recent improvement in BNP from 491 to 127. Current medications include irbesartan  and metoprolol  succinate. Previous hypotension on Entresto  and significant fatigue on spironolactone . Recent increase in creatinine to 1.29 and potassium at 4. - Transition to oral diuretics today (give one IV lasix  dose today) - Continue irbesartan  300 mg - Continue metoprolol  succinate 100 mg daily -  Discharge on Lasix  40 mg daily, double her at-home dose and increased potassium - Order BMP in two weeks for Doctor [place]'s team  Atrial fibrillation status post ablation and sick  sinus syndrome status post pacemaker - Atrial fibrillation status post ablation and sick sinus syndrome status post pacemaker. Managed by Doctor Ole Holts  Planned for AV nodal ablation on September 15, 2023. Recent evaluation showed improvement in symptoms and sinus rhythm with rare wide complex tachycardia. Recent paroxysmal atrial fibrillation noted on August 28, 2023. - Proceed with AV nodal ablation on September 15, 2023 - Defer upgrades to electrophysiology team - Reach out to Doctor [place]'s team for additional blood work if needed  Paroxysmal supraventricular tachycardia and premature ventricular contractions Two episodes of paroxysmal supraventricular tachycardia and a few couplets and triplets of PVCs. Recent paroxysmal atrial fibrillation. Reports feeling great yesterday but more shortness of breath today. Decreased breath sounds at bases on exam but no crackles. - no change to BB therapy  Hyperlipidemia Chronic hyperlipidemia managed with simvastatin . - Continue simvastatin   Reviewed at length with patient, daughter, Outpatient EP team, and Dr. Noralee  For questions or updates, please contact [facility] Please consult [email or web address] for contact info under Cardiology/STEMI.      Stanly Leavens, MD [facility] Cardiologist [facility]  [address] [place], [address] [place], [place] [address] [phone]  9:04 AM

## 2023-08-31 NOTE — TOC CM/SW Note (Addendum)
 Transition of Care [facility]) - Inpatient Brief Assessment   Patient Details  Name: Shannon [name] MRN: [id number] Date of Birth: February 19, 1950  Transition of Care [facility]) CM/SW Contact:    Waddell Barnie Rama, RN Phone Number: 08/31/2023, 1:59 PM   Clinical Narrative:  From home with daughter and grandson, has PCP, Thersia Stark at [facility] on file, states has no HH services in place at this time , has cpap, walker, w/chair and rollator at home.  States family member ( daughter)  will transport them home at [employer] and family is support system, states gets medications from [facility] order, [facility] on [address].  Pta self ambulatory with walker.   Per pt eval rec outpatient physical therapy.  She would like to do this at [facility]. NCM made referral thru epic for outpt PT.  Transition of Care Asessment: Insurance and Status: Insurance coverage has been reviewed Patient has primary care physician: Yes Home environment has been reviewed: lives with daughter and grandson Prior level of function:: ambulatory with walker Prior/Current Home Services: Current home services (cpap, walker, rollator, w/chair, shower already has a bench) Social Drivers of Health Review: SDOH reviewed no interventions necessary Readmission risk has been reviewed: Yes Transition of care needs: transition of care needs identified, TOC will continue to follow

## 2023-08-31 NOTE — Progress Notes (Signed)
 Mobility Specialist Progress Note:    08/31/23 1428  Mobility  Activity Ambulated with assistance  Level of Assistance Standby assist, set-up cues, supervision of patient - no hands on  Assistive Device None  Distance Ambulated (ft) 100 ft  Activity Response Tolerated well  Mobility Referral Yes  Mobility visit 1 Mobility  Mobility Specialist Start Time (ACUTE ONLY) 1428  Mobility Specialist Stop Time (ACUTE ONLY) 1439  Mobility Specialist Time Calculation (min) (ACUTE ONLY) 11 min   Pt getting d/c but pleasant and agreeable to session. No c/o any symptoms. Pt moving well and ambulating well. Returned pt to EOB w/ all needs met.   Venetia Keel Mobility Specialist Please Neurosurgeon or Rehab Office at [phone]

## 2023-08-31 NOTE — Telephone Encounter (Signed)
 Copied from CRM #[id number]. Topic: Clinical - Medical Advice >> Aug 31, 2023  1:40 PM Anairis L wrote: Reason for CRM: Patient is being released today form [facility] in [place] but when trying to schedule only app that comes up is into October.   Thanks

## 2023-08-31 NOTE — Telephone Encounter (Signed)
 Patient needs hospital follow up in the next 2 weeks please schedule

## 2023-08-31 NOTE — Progress Notes (Signed)
 Heart Failure Navigator Progress Note  Assessed for Heart & Vascular TOC clinic readiness.  Patient does not meet criteria due to has a scheduled [facility] appointment on 10/20/2023. No HF TOC per Dr. Noralee. .   Navigator will sign off at this time.   Stephane Haddock, BSN, Scientist, clinical (histocompatibility and immunogenetics) Only

## 2023-09-01 ENCOUNTER — Ambulatory Visit: Payer: Self-pay

## 2023-09-01 ENCOUNTER — Encounter: Payer: Self-pay | Admitting: Cardiology

## 2023-09-01 ENCOUNTER — Telehealth: Payer: Self-pay

## 2023-09-01 NOTE — Telephone Encounter (Signed)
 Pt scheduled 8/27 for a visit.

## 2023-09-01 NOTE — Transitions of Care (Post Inpatient/ED Visit) (Unsigned)
 09/02/2023  Name: Shannon [name] MRN: [id number] DOB: 1950/06/27  Today's TOC FU Call Status: Today's TOC FU Call Status:: Successful TOC FU Call Completed TOC FU Call Complete Date: 09/01/23 Patient's Name and Date of Birth confirmed.  Transition Care Management Follow-up Telephone Call Date of Discharge: 08/31/23 Discharge Facility: [facility]) Type of Discharge: Inpatient Admission Primary Inpatient Discharge Diagnosis:: Acute on chronic systolic heart failure How have you been since you were released from the hospital?: Worse (Patient states she felt horrible yesterday after discharge from the hospital.  States she was lightheaded/ dizzy on yesterday and went into afib. She reports calling the night on call doctor for evaluation.  She is scheduled to see her primary 09/01/23) Any questions or concerns?: Yes Patient Questions/Concerns:: Patient states she needs to know who to call when she starts having symptoms. She states prior to this hospitalization she was having SOB and called the cardiology office to let them know.  She states she didn't hear back from anyone after a couple of days then called again. she states by this time her symptoms had worsen and she went to the ED. Patient Questions/Concerns Addressed: Other: (Patient advised to call primary care provider office for non emergent symptoms/ concerns. Explained that she would more than likely get in quicker with her primary as well as receiving a return call. Discussed when to seek emergency medical treatment)  Items Reviewed: Did you receive and understand the discharge instructions provided?: Yes Medications obtained,verified, and reconciled?: Yes (Medications Reviewed) Any new allergies since your discharge?: No Dietary orders reviewed?: Yes Type of Diet Ordered:: cardiac diet Do you have support at home?: Yes People in Home [RPT]: child(ren), adult Name of Support/Comfort Primary Source: Kayona Foor  Medications Reviewed Today: Medications Reviewed Today     Reviewed by Mylan Schwarz E, RN (Registered Nurse) on 09/01/23 at 1431  Med List Status: <None>   Medication Order Taking? Sig Documenting Provider Last Dose Status Informant  acetaminophen  (TYLENOL ) 500 MG tablet [id number] Yes Take 1,000 mg by mouth every 6 (six) hours as needed for moderate pain or headache. [provider]  Active Self, Pharmacy Records  busPIRone  (BUSPAR ) 5 MG tablet [id number] Yes Take 1 tablet (5 mg total) by mouth 2 (two) times daily. [name], NP  Active Self, Pharmacy Records  Cholecalciferol  (VITAMIN D ) 50 MCG (2000 UT) CAPS [id number] Yes Take 2,000 Units by mouth in the morning. [provider]  Active Self, Pharmacy Records  ELIQUIS  5 MG TABS tablet [id number] Yes TAKE 1 TABLET BY MOUTH TWICE  DAILY [name], MD  Active Self, Pharmacy Records  empagliflozin  (JARDIANCE ) 10 MG TABS tablet [id number] Yes Take 1 tablet (10 mg total) by mouth daily before breakfast. Darryle Thom CROME, PA-C  Active Self, Pharmacy Records  furosemide  (LASIX ) 20 MG tablet [id number] Yes Take 2 tablets (40 mg total) by mouth daily. In case of weight gain 2 to 3 lbs in 24 hrs or 4 lbs in 7 days take 40 mg twice daily until weight back to baseline Arrien, Elidia Sieving, MD  Active   Hydrocortisone [facility]) [id number] Yes Apply 1 application  topically as needed (skin irritation/itching). [provider]  Active Self, Pharmacy Records  levalbuterol  (XOPENEX  HFA) 45 MCG/ACT inhaler [id number] Yes Inhale 2 puffs into the lungs every 4 (four) hours as needed for wheezing. [name], NP  Active Self, Pharmacy Records  metoprolol  succinate (TOPROL -XL) 100 MG 24 hr tablet [id number] Yes Take  1 tablet (100 mg total) by mouth daily. Take with or immediately following a meal. Arrien, Elidia Sieving, MD  Active   Multiple Vitamin (MULTIVITAMIN) tablet [id number] Yes Take 1 tablet by mouth at  bedtime. [provider]  Active Self, Pharmacy Records  olmesartan  (BENICAR ) 20 MG tablet [id number] Yes Take 2 tablets (40 mg total) by mouth daily. Arrien, Mauricio Daniel, MD  Active   potassium chloride  SA (KLOR-CON  M) 20 MEQ tablet [id number] Yes Take 1 tablet (20 mEq total) by mouth daily. Take with furosemide . Arrien, Elidia Sieving, MD  Active   simvastatin  (ZOCOR ) 20 MG tablet [id number] Yes TAKE 1 TABLET BY MOUTH DAILY [name], NP  Active Self, Pharmacy Records            Home Care and Equipment/Supplies: Were Home Health Services Ordered?: No Any new equipment or medical supplies ordered?: No  Functional Questionnaire: Do you need assistance with bathing/showering or dressing?: No Do you need assistance with meal preparation?: No Do you need assistance with eating?: No Do you have difficulty maintaining continence: No Do you need assistance with getting out of bed/getting out of a chair/moving?: No Do you have difficulty managing or taking your medications?: No  Follow up appointments reviewed: PCP Follow-up appointment confirmed?: Yes Date of PCP follow-up appointment?: 09/02/23 Follow-up Provider: Thersia Stark [facility] Follow-up appointment confirmed?: NA Do you need transportation to your follow-up appointment?: No (patient would like referral to community resource guide for transportation assistance.)  SDOH Interventions Today    Flowsheet Row Most Recent Value  SDOH Interventions   Depression Interventions/Treatment  Counseling  [patient in weekly counseling and on medication and has good family support.]   Discussed and offered 30 day TOC program.  Patient verbally agreed.  The patient has been provided with contact information for the care management team and has been advised to call with any health -related questions or concerns.  The patient verbalized understanding with current plan of care.  The patient is directed to their  insurance card regarding availability of benefits coverage.    Arvin Seip RN, BSN, CCM [employer], Population Health Case Manager Phone: [phone]

## 2023-09-01 NOTE — Telephone Encounter (Signed)
 FYI Only or Action Required?: FYI only for provider.  Patient was last seen in primary care on 07/13/2023 by Knute [name], FNP.  Called Nurse Triage reporting Dizziness.  Symptoms began several days ago.  Interventions attempted: Nothing.  Symptoms are: stable.  Triage Disposition: See Physician Within 24 Hours  Patient/caregiver understands and will follow disposition?: Yes                             Copied from CRM #[id number]. Topic: Clinical - Red Word Triage >> Sep 01, 2023 12:19 PM Nathanel DEL wrote: Red Word that prompted transfer to Nurse Triage: pt just got out of hospital yeserday afternon,pt is feeling,dizzy,just not much better Reason for Disposition  [1] MODERATE dizziness (e.g., interferes with normal activities) AND [2] has NOT been evaluated by doctor (or NP/PA) for this  (Exception: Dizziness caused by heat exposure, sudden standing, or poor fluid intake.)  Answer Assessment - Initial Assessment Questions 1. DESCRIPTION: Describe your dizziness.     Lightheadedness that becomes dizziness upon exertion 2. LIGHTHEADED: Do you feel lightheaded? (e.g., somewhat faint, woozy, weak upon standing)     Yes, denies fainting/falling at this time 3. VERTIGO: Do you feel like either you or the room is spinning or tilting? (i.e., vertigo)     Yes, upon movement  4. SEVERITY: How bad is it?  Do you feel like you are going to faint? Can you stand and walk?     States she uses her walker to move around, states she feels the need to rest frequently  5. ONSET:  When did the dizziness begin?     States she got out of the hospital yesterday and dizziness exacerbated this morning, states symptoms have improved throughout the morning, especially after eating lunch 6. AGGRAVATING FACTORS: Does anything make it worse? (e.g., standing, change in head position)     Getting up and down  7. HEART RATE: Can you tell me your heart rate? How many  beats in 15 seconds?  (Note: Not all patients can do this.)       [id number] 8. CAUSE: What do you think is causing the dizziness? (e.g., decreased fluids or food, diarrhea, emotional distress, heat exposure, new medicine, sudden standing, vomiting; unknown)     States she went into AFib last night, but HR and symptoms have improved 9. RECURRENT SYMPTOM: Have you had dizziness before? If Yes, ask: When was the last time? What happened that time?     Yes 10. OTHER SYMPTOMS: Do you have any other symptoms? (e.g., fever, chest pain, vomiting, diarrhea, bleeding)       Weakness and numbness in legs and feet at baseline, back pain, denies fever, denies chest pain, denies difficulty breathing    Patient was recently admitted to hospital for hypertension. BP was 114/86 this morning. Patient was discharged yesterday and reports she is still experiencing dizziness. Patient scheduled in office with PCP for tomorrow morning. Advised patient to monitor symptoms in the meantime and call back if symptoms worsen. Patient verbalized understanding.  Protocols used: Dizziness - Lightheadedness-A-AH

## 2023-09-02 ENCOUNTER — Emergency Department (HOSPITAL_BASED_OUTPATIENT_CLINIC_OR_DEPARTMENT_OTHER)

## 2023-09-02 ENCOUNTER — Encounter (HOSPITAL_BASED_OUTPATIENT_CLINIC_OR_DEPARTMENT_OTHER): Payer: Self-pay

## 2023-09-02 ENCOUNTER — Emergency Department (HOSPITAL_BASED_OUTPATIENT_CLINIC_OR_DEPARTMENT_OTHER)
Admission: EM | Admit: 2023-09-02 | Discharge: 2023-09-02 | Disposition: A | Discharge status: Home / Self Care | Source: Ambulatory Visit | Attending: Emergency Medicine | Admitting: Emergency Medicine

## 2023-09-02 ENCOUNTER — Ambulatory Visit (INDEPENDENT_AMBULATORY_CARE_PROVIDER_SITE_OTHER): Admitting: Family Medicine

## 2023-09-02 ENCOUNTER — Encounter (HOSPITAL_BASED_OUTPATIENT_CLINIC_OR_DEPARTMENT_OTHER): Payer: Self-pay | Admitting: Family Medicine

## 2023-09-02 ENCOUNTER — Other Ambulatory Visit: Payer: Self-pay

## 2023-09-02 VITALS — BP 90/68 | HR 71 | Ht 67.0 in | Wt 181.0 lb

## 2023-09-02 DIAGNOSIS — N179 Acute kidney failure, unspecified: Secondary | ICD-10-CM | POA: Diagnosis not present

## 2023-09-02 DIAGNOSIS — Z09 Encounter for follow-up examination after completed treatment for conditions other than malignant neoplasm: Secondary | ICD-10-CM | POA: Diagnosis not present

## 2023-09-02 DIAGNOSIS — J984 Other disorders of lung: Secondary | ICD-10-CM | POA: Diagnosis not present

## 2023-09-02 DIAGNOSIS — I509 Heart failure, unspecified: Secondary | ICD-10-CM | POA: Diagnosis not present

## 2023-09-02 DIAGNOSIS — N19 Unspecified kidney failure: Secondary | ICD-10-CM | POA: Diagnosis not present

## 2023-09-02 DIAGNOSIS — Z7901 Long term (current) use of anticoagulants: Secondary | ICD-10-CM | POA: Diagnosis not present

## 2023-09-02 DIAGNOSIS — I959 Hypotension, unspecified: Secondary | ICD-10-CM | POA: Diagnosis not present

## 2023-09-02 DIAGNOSIS — I4891 Unspecified atrial fibrillation: Secondary | ICD-10-CM | POA: Diagnosis not present

## 2023-09-02 DIAGNOSIS — R918 Other nonspecific abnormal finding of lung field: Secondary | ICD-10-CM | POA: Diagnosis not present

## 2023-09-02 DIAGNOSIS — R7989 Other specified abnormal findings of blood chemistry: Secondary | ICD-10-CM | POA: Diagnosis present

## 2023-09-02 DIAGNOSIS — R0602 Shortness of breath: Secondary | ICD-10-CM | POA: Diagnosis not present

## 2023-09-02 LAB — COMPREHENSIVE METABOLIC PANEL WITH GFR
ALT: 19 U/L (ref 0–44)
AST: 29 U/L (ref 15–41)
Albumin: 4.2 g/dL (ref 3.5–5.0)
Alkaline Phosphatase: 86 U/L (ref 38–126)
Anion gap: 13 (ref 5–15)
BUN: 28 mg/dL — ABNORMAL HIGH (ref 8–23)
CO2: 30 mmol/L (ref 22–32)
Calcium: 10.7 mg/dL — ABNORMAL HIGH (ref 8.9–10.3)
Chloride: 90 mmol/L — ABNORMAL LOW (ref 98–111)
Creatinine, Ser: 1.16 mg/dL — ABNORMAL HIGH (ref 0.44–1.00)
GFR, Estimated: 50 mL/min — ABNORMAL LOW (ref >60)
Glucose, Bld: 105 mg/dL — ABNORMAL HIGH (ref 70–99)
Potassium: 4 mmol/L (ref 3.5–5.1)
Sodium: 133 mmol/L — ABNORMAL LOW (ref 135–145)
Total Bilirubin: 0.6 mg/dL (ref 0.0–1.2)
Total Protein: 7.8 g/dL (ref 6.5–8.1)

## 2023-09-02 LAB — CBC WITH DIFFERENTIAL/PLATELET
Abs Immature Granulocytes: 0.04 K/uL (ref 0.00–0.07)
Basophils Absolute: 0.1 K/uL (ref 0.0–0.1)
Basophils Relative: 1 %
Eosinophils Absolute: 0.5 K/uL (ref 0.0–0.5)
Eosinophils Relative: 5 %
HCT: 39.6 % (ref 36.0–46.0)
Hemoglobin: 12.5 g/dL (ref 12.0–15.0)
Immature Granulocytes: 0 %
Lymphocytes Relative: 15 %
Lymphs Abs: 1.3 K/uL (ref 0.7–4.0)
MCH: 28.2 pg (ref 26.0–34.0)
MCHC: 31.6 g/dL (ref 30.0–36.0)
MCV: 89.2 fL (ref 80.0–100.0)
Monocytes Absolute: 1 K/uL (ref 0.1–1.0)
Monocytes Relative: 11 %
Neutro Abs: 6 K/uL (ref 1.7–7.7)
Neutrophils Relative %: 68 %
Platelets: 413 K/uL — ABNORMAL HIGH (ref 150–400)
RBC: 4.44 MIL/uL (ref 3.87–5.11)
RDW: 16.4 % — ABNORMAL HIGH (ref 11.5–15.5)
WBC: 8.9 K/uL (ref 4.0–10.5)
nRBC: 0 % (ref 0.0–0.2)

## 2023-09-02 LAB — TROPONIN T, HIGH SENSITIVITY
Troponin T High Sensitivity: 42 ng/L — ABNORMAL HIGH (ref 0–19)
Troponin T High Sensitivity: 38 ng/L — ABNORMAL HIGH (ref 0–19)

## 2023-09-02 LAB — PRO BRAIN NATRIURETIC PEPTIDE: Pro Brain Natriuretic Peptide: 3636 pg/mL — ABNORMAL HIGH (ref <300.0)

## 2023-09-02 MED ORDER — METOPROLOL TARTRATE 5 MG/5ML IV SOLN
2.5000 mg | Freq: Once | INTRAVENOUS | Status: AC
  Administered 2023-09-02: 2.5 mg via INTRAVENOUS
  Filled 2023-09-02: qty 5

## 2023-09-02 MED ORDER — METOPROLOL TARTRATE 5 MG/5ML IV SOLN: 5.0000 mg | Freq: Once | INTRAVENOUS | Status: DC

## 2023-09-02 NOTE — Progress Notes (Signed)
 Subjective:   Shannon [name] 13-Sep-1950 09/02/2023  Chief Complaint  Patient presents with   Dizziness    Pt states after recent hospital stay she began to have problems with dizziness. States since then she has been feeling worse. States SOB is better but weakness is not any better.    HPI: Shannon [name] presents today for hospital follow up with PCP.   Patient was recently admitted to [facility] on 08/27/2023 and discharged on 08/31/2023 for acute on chronic systolic CHF.  She is currently undergoing treatment with [facility] cardiology and is scheduled AV nodal ablation for A-fib with RVR scheduled on September 15, 2023.  She has a history of primary hypertension, COPD, CVA history.  She had recently been started on olmesartan  by cardiology for hypertension and was having severe dyspnea and chest pain, prompting her to go to the ER.  She did have vascular congestion, cardiomegaly upon imaging.  Laced on IV furosemide  and nitroglycerin  and admitted for diuresis due to volume overload in setting of atrial fibrillation with RVR.  Was discharged home with furosemide  increased to 40 mg and olmesartan  increased to 40 mg.  She was recommended to follow-up with her PCP in 7 days for repeat renal function and electrolytes.  Patient states since being home she has been having increasing fatigue and dizziness since time of discharge.  She is following her medication changes as directed and reports significantly low blood pressure readings with BP readings of 98/82, 84/69, 80/71, 120/74 this morning.  She reports significant back pain occurring since time of discharge.  Reports decrease in her nighttime urination.  She denies dysuria, hematuria, frequency or urgency, fever or chills, swelling or recent weight gain.  Lab Results  Component Value Date   NA 133 (L) 09/02/2023   CL 90 (L) 09/02/2023   K 4.0 09/02/2023   CO2 30 09/02/2023   BUN 28 (H) 09/02/2023   CREATININE 1.16  (H) 09/02/2023   GFRNONAA 50 (L) 09/02/2023   CALCIUM  10.7 (H) 09/02/2023   PHOS 3.7 07/09/2023   ALBUMIN 4.2 09/02/2023   GLUCOSE 105 (H) 09/02/2023     The following portions of the patient's history were reviewed and updated as appropriate: past medical history, past surgical history, family history, social history, allergies, medications, and problem list.   Patient Active Problem List   Diagnosis Date Noted   Hypertensive urgency 08/28/2023   Hypokalemia 08/28/2023   Acute on chronic systolic CHF (congestive heart failure) (HCC) 08/27/2023   Chronic obstructive pulmonary disease with acute exacerbation (HCC) 07/14/2023   Chronic HFrEF (heart failure with reduced ejection fraction) (HCC) 07/06/2023   Atrial fibrillation with RVR (HCC) 03/07/2023   History of tachycardia-bradycardia syndrome (HCC) 03/07/2023   Demand ischemia (HCC) 03/07/2023   Current severe episode of major depressive disorder without psychotic features without prior episode (HCC) 03/06/2022   GAD (generalized anxiety disorder) 03/06/2022   Caregiver role strain 03/06/2022   Adjustment disorder with mixed anxiety and depressed mood 03/03/2022   Diplopia 01/15/2022   History of CVA (cerebrovascular accident) 01/15/2022   Thoracic spine pain 11/22/2021   Tinnitus of both ears 11/22/2021   Bilateral lower extremity edema 11/22/2021   Acute CVA (cerebrovascular accident) (HCC) 02/06/2021   Dyslipidemia, goal LDL below 130 07/10/2020   Iron  deficiency anemia 07/10/2020   Shortness of breath 12/23/2019   Asthma 12/23/2019   Morbid obesity (HCC) 09/11/2017   Chronic fatigue 05/27/2016   A-fib (HCC) 03/25/2016   Typical  atrial flutter (HCC)    Chronic diastolic CHF (congestive heart failure) (HCC) 12/08/2015   Atrial fibrillation with rapid ventricular response (HCC) 12/08/2015   Thoracic compression fracture (HCC) 11/16/2015   Orthostatic hypotension 11/15/2015   Osteopenia 09/12/2015   Vitamin D  deficiency  disease 08/29/2015   Polymyalgia rheumatica (HCC) 04/18/2015   OSA (obstructive sleep apnea) 03/04/2015   Myalgia 11/22/2014   Primary hypertension 08/21/2014   Anxiety and depression 08/21/2014   Past Medical History:  Diagnosis Date   Acute systolic heart failure (HCC) 07/08/2023   Allergy    Anemia    Anxiety    Arthritis 1990's   neck and lower back (03/25/2016)   Asthma 1990s X 1   short term inhaler use    CAD (coronary artery disease)    Cardiac pacemaker in situ    MDT   CHF (congestive heart failure) (HCC)    Chronic lower back pain    COVID-19 03/06/2022   Degenerative disorder of bone    Depression    Diastolic dysfunction    Drug-induced lupus erythematosus    HCTZ induced; still gettin over it (03/25/2016)   Foot swelling 05/19/2022   GERD (gastroesophageal reflux disease) 1990's   Headache, unspecified headache type 03/06/2022   Herniated disc, cervical    Hospital discharge follow-up 03/14/2022   Hyperlipidemia    Hypertension    Neuromuscular disorder (HCC)    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 HTN   Obesity (BMI 30-39.9) 11/07/2022   Orthostatic hypotension    OSA on CPAP    Osteopenia    Osteoporosis 2012   PAF (paroxysmal atrial fibrillation) (HCC)    Paroxysmal atrial fibrillation (HCC) 12/13/2021   Phlebitis after infusion 03/18/2023   Pinched nerve in neck    PND (post-nasal drip) 12/18/2022   Rapid atrial fibrillation (HCC) 07/06/2023   Sinus node dysfunction (HCC)    Sinus pressure 12/18/2022   Sleep apnea 1990's   wears CPAP   Stroke (HCC)    T12 compression fracture (HCC) 11/2015   Vitamin D  deficiency    Whiplash injury 06/07/2010   Past Surgical History:  Procedure Laterality Date   APPENDECTOMY  1990s   ATRIAL FIBRILLATION ABLATION N/A 03/25/2016   Procedure: Atrial Fibrillation Ablation;  Surgeon: Lynwood Rakers, MD;  Location: [facility] INVASIVE CV LAB;  Service: Cardiovascular;  Laterality: N/A;   ATRIAL  FIBRILLATION ABLATION N/A 01/31/2020   Procedure: ATRIAL FIBRILLATION ABLATION;  Surgeon: Rakers Lynwood, MD;  Location: [facility] INVASIVE CV LAB;  Service: Cardiovascular;  Laterality: N/A;   ATRIAL FIBRILLATION ABLATION N/A 12/13/2021   Procedure: ATRIAL FIBRILLATION ABLATION;  Surgeon: [name], MD;  Location: [facility] INVASIVE CV LAB;  Service: Cardiovascular;  Laterality: N/A;   CARDIOVERSION N/A 07/09/2023   Procedure: CARDIOVERSION;  Surgeon: Okey Vina GAILS, MD;  Location: [facility] INVASIVE CV LAB;  Service: Cardiovascular;  Laterality: N/A;   COLONOSCOPY     FOREARM FRACTURE SURGERY Left ~ 02/2011   broke arm; shattered wrist   FOREARM HARDWARE REMOVAL Left ~ 07/2011   implantable loop recorder placement  03/07/2019   Medtronic Reveal Linq model LNQ 22 implantable loop recorder ([id number]) implanted by Dr Rakers for Afib management   INSERT / REPLACE / REMOVE PACEMAKER  2025   LOOP RECORDER REMOVAL N/A 03/09/2023   Procedure: LOOP RECORDER REMOVAL;  Surgeon: Kennyth Chew, MD;  Location: [facility] INVASIVE CV LAB;  Service: Cardiovascular;  Laterality: N/A;   PACEMAKER IMPLANT N/A 03/09/2023   Procedure: PACEMAKER IMPLANT;  Surgeon: Kennyth Chew, MD;  Location: [facility] INVASIVE CV LAB;  Service: Cardiovascular;  Laterality: N/A;   Spinal Nerve Ablation     TEE WITHOUT CARDIOVERSION N/A 03/24/2016   Procedure: TRANSESOPHAGEAL ECHOCARDIOGRAM (TEE);  Surgeon: Jerel Balding, MD;  Location: [facility] ENDOSCOPY;  Service: Cardiovascular;  Laterality: N/A;   Family History  Problem Relation Age of Onset   Lung cancer Mother    Cancer Mother    Early death Mother    Miscarriages / [place] Mother    Vision loss Mother    Stroke Father    Hypertension Father    Heart disease Father    Hypertension Maternal Grandmother    Stroke Maternal Grandfather    Heart disease Maternal Grandfather    Vision loss Maternal Grandfather    Diabetes Paternal Grandmother    Heart disease Paternal Grandmother     Diabetes Paternal Grandfather    Hearing loss Paternal Grandfather    Vision loss Paternal Grandfather    Bipolar disorder Daughter    Other Daughter        fatty liver   Other Son        fattye liver, born with 1 kidney   ADD / ADHD Daughter    Alcohol abuse Daughter    Depression Daughter    Hypertension Daughter    Learning disabilities Daughter    Miscarriages / Stillbirths Daughter    Obesity Daughter    Vision loss Daughter    ADD / ADHD Son    Birth defects Son    Diabetes Son    Hearing loss Son    Learning disabilities Son    Obesity Son    Vision loss Son    Heart disease Brother    Hypertension Brother    Heart disease Sister    Hypertension Sister    Vision loss Sister    Obesity Sister    Colon cancer Neg Hx    Esophageal cancer Neg Hx    Rectal cancer Neg Hx    Stomach cancer Neg Hx    Outpatient Medications Prior to Visit  Medication Sig Dispense Refill   acetaminophen  (TYLENOL ) 500 MG tablet Take 1,000 mg by mouth every 6 (six) hours as needed for moderate pain or headache.     busPIRone  (BUSPAR ) 5 MG tablet Take 1 tablet (5 mg total) by mouth 2 (two) times daily. 200 tablet 0   Cholecalciferol  (VITAMIN D ) 50 MCG (2000 UT) CAPS Take 2,000 Units by mouth in the morning.     ELIQUIS  5 MG TABS tablet TAKE 1 TABLET BY MOUTH TWICE  DAILY 200 tablet 2   empagliflozin  (JARDIANCE ) 10 MG TABS tablet Take 1 tablet (10 mg total) by mouth daily before breakfast. 90 tablet 3   furosemide  (LASIX ) 20 MG tablet Take 2 tablets (40 mg total) by mouth daily. In case of weight gain 2 to 3 lbs in 24 hrs or 4 lbs in 7 days take 40 mg twice daily until weight back to baseline 120 tablet 0   Hydrocortisone (CORTIZONE-10 EX) Apply 1 application  topically as needed (skin irritation/itching).     levalbuterol  (XOPENEX  HFA) 45 MCG/ACT inhaler Inhale 2 puffs into the lungs every 4 (four) hours as needed for wheezing. 1 each 2   metoprolol  succinate (TOPROL -XL) 100 MG 24 hr tablet Take  1 tablet (100 mg total) by mouth daily. Take with or immediately following a meal.     Multiple Vitamin (MULTIVITAMIN) tablet Take 1 tablet by mouth at  bedtime.     olmesartan  (BENICAR ) 20 MG tablet Take 2 tablets (40 mg total) by mouth daily. 60 tablet 0   potassium chloride  SA (KLOR-CON  M) 20 MEQ tablet Take 1 tablet (20 mEq total) by mouth daily. Take with furosemide . 30 tablet 0   simvastatin  (ZOCOR ) 20 MG tablet TAKE 1 TABLET BY MOUTH DAILY 100 tablet 2   No facility-administered medications prior to visit.   Allergies  Allergen Reactions   Definity  [Perflutren  Lipid Microsphere] Other (See Comments)    Had unresponsive episode with concerns on allergic reaction. AVOID.   Ivp Dye [Iodinated Contrast Media] Shortness Of Breath   Spironolactone  Shortness Of Breath   Sulfur Nausea And Vomiting   Ace Inhibitors Cough   Amiodarone  Other (See Comments)    Intolerance multiple side effects   Entresto  [Sacubitril -Valsartan ]     Discontinued per Dr. Cindie due to severe hypertension   Hctz [Hydrochlorothiazide ] Other (See Comments)    Caused drug-induced LUPUS   Oxycodone Other (See Comments)    Hallucinations   Prednisone  Other (See Comments)    Made patient very aggressive   Sulfa Antibiotics Nausea And Vomiting   Symbicort  [Budesonide -Formoterol  Fumarate]     BP crashed directed to discontinue per MD   Voltaren [Diclofenac Sodium] Other (See Comments)    Made patient become aggressive     ROS: A complete ROS was performed with pertinent positives/negatives noted in the HPI. The remainder of the ROS are negative.    Objective:   Today's Vitals   09/02/23 1033 09/02/23 1043 09/02/23 1103  BP: (!) 87/71 106/70 90/68  Pulse: 71    SpO2: 98%    Weight: 181 lb (82.1 kg)    Height: 5' 7 (1.702 m)      Physical Exam   GENERAL: Well-appearing, in NAD. Well nourished.  SKIN: Pink, warm and dry.   Head: Normocephalic. NECK: Trachea midline. Full ROM w/o pain or  tenderness. RESPIRATORY: Chest wall symmetrical. Respirations even and non-labored. Breath sounds clear to auscultation bilaterally.  CARDIAC: S1, S2 present, regular rate and rhythm without murmur or gallops. Peripheral pulses 2+ bilaterally.  MSK: Muscle tone and strength appropriate for age.   EXTREMITIES: Without clubbing, cyanosis, or edema.  NEUROLOGIC: No motor or sensory deficits.  Gait difficult without assistance. C2-C12 intact.  PSYCH/MENTAL STATUS: Alert, oriented x 3. Cooperative, appropriate mood and affect.   Health Maintenance Due  Topic Date Due   Hepatitis C Screening  Never done   Colonoscopy  02/23/2021    The ASCVD Risk score (Arnett DK, et al., 2019) failed to calculate for the following reasons:   Risk score cannot be calculated because patient has a medical history suggesting prior/existing ASCVD     Assessment & Plan:  1. Hospital discharge follow-up (Primary) Reviewed patient's discharge instructions and plan to obtain lab work per hospitalist recommendations, but patient needs to go to ER due to hypotensive episode, dizziness and concern for AKI.  Will obtain lab work in ER today.  She will continue to follow-up with cardiology as scheduled and will return to PCP post ER visit.  2. Hypotensive episode Patient remains hypotensive in PCP office today and is symptomatic.  Will transport patient downstairs to [facility] for further evaluation and stat labs that cannot be performed here in PCP office.  Patient agreeable.  3. AKI (acute kidney injury) (HCC) Concern for possible AKI given recent lab work.  Recommend patient obtain a stat BMP and urinalysis in the ER for more  urgent evaluation given increase of loop diuretic and olmesartan .   No orders of the defined types were placed in this encounter.  Lab Orders  No laboratory test(s) ordered today   Return in about 1 week (around 09/09/2023) for 1-2 weeks for ER Follow up . SABRA    Patient to reach out to  office if new, worrisome, or unresolved symptoms arise or if no improvement in patient's condition. Patient verbalized understanding and is agreeable to treatment plan. All questions answered to patient's satisfaction.    Thersia Schuyler Stark, [place]

## 2023-09-02 NOTE — ED Provider Notes (Signed)
 [place] EMERGENCY DEPARTMENT AT [facility] Provider Note   CSN: [id number] Arrival date & time: 09/02/23  1124     Patient presents with: Abnormal Labs   Shannon [name] is a 73 y.o. female.   Patient sent here by primary care for abnormal labs.  She was just hospitalized for heart failure has been on heavy doses of Lasix .  She has a history of a flutter A-fib on Eliquis  and metoprolol .  Her creatinine went up to 1.26.  Slightly above her baseline.  Was told to come here for reevaluation of her kidney function.  She took her last dose of Lasix  this morning.  She is taking 40 mg daily.  She is trying to weigh herself daily.  She is not having any shortness of breath or chest pain.  She has not noticed that she has been in A-fib or a flutter.  Her energy levels been decreased since hospital stay but her breathing has been feeling much better.  She has no leg swelling.  The history is provided by the patient.       Prior to Admission medications   Medication Sig Start Date End Date Taking? Authorizing Provider  acetaminophen  (TYLENOL ) 500 MG tablet Take 1,000 mg by mouth every 6 (six) hours as needed for moderate pain or headache.    [provider]  busPIRone  (BUSPAR ) 5 MG tablet Take 1 tablet (5 mg total) by mouth 2 (two) times daily. 06/25/23   [name], NP  Cholecalciferol  (VITAMIN D ) 50 MCG (2000 UT) CAPS Take 2,000 Units by mouth in the morning.    [provider]  ELIQUIS  5 MG TABS tablet TAKE 1 TABLET BY MOUTH TWICE  DAILY 06/23/23   [name], MD  empagliflozin  (JARDIANCE ) 10 MG TABS tablet Take 1 tablet (10 mg total) by mouth daily before breakfast. 07/21/23   Darryle Thom CROME, PA-C  furosemide  (LASIX ) 20 MG tablet Take 2 tablets (40 mg total) by mouth daily. In case of weight gain 2 to 3 lbs in 24 hrs or 4 lbs in 7 days take 40 mg twice daily until weight back to baseline 09/01/23   Arrien, Mauricio Daniel, MD  Hydrocortisone  (CORTIZONE-10 EX) Apply 1 application  topically as needed (skin irritation/itching).    [provider]  levalbuterol  (XOPENEX  HFA) 45 MCG/ACT inhaler Inhale 2 puffs into the lungs every 4 (four) hours as needed for wheezing. 06/16/23   [name], NP  metoprolol  succinate (TOPROL -XL) 100 MG 24 hr tablet Take 1 tablet (100 mg total) by mouth daily. Take with or immediately following a meal. 08/31/23   Arrien, Elidia Sieving, MD  Multiple Vitamin (MULTIVITAMIN) tablet Take 1 tablet by mouth at bedtime.    [provider]  olmesartan  (BENICAR ) 20 MG tablet Take 2 tablets (40 mg total) by mouth daily. 08/31/23   Arrien, Elidia Sieving, MD  potassium chloride  SA (KLOR-CON  M) 20 MEQ tablet Take 1 tablet (20 mEq total) by mouth daily. Take with furosemide . 08/31/23   Arrien, Elidia Sieving, MD  simvastatin  (ZOCOR ) 20 MG tablet TAKE 1 TABLET BY MOUTH DAILY 05/14/23   [name], NP    Allergies: Definity  [perflutren  lipid microsphere], Ivp dye [iodinated contrast media], Spironolactone , Sulfur, Ace inhibitors, Amiodarone , Entresto  [sacubitril -valsartan ], Hctz [hydrochlorothiazide ], Oxycodone, Prednisone , Sulfa antibiotics, Symbicort  [budesonide -formoterol  fumarate], and Voltaren [diclofenac sodium]    Review of Systems  Updated Vital Signs BP (!) 107/90   Pulse (!) 44   Temp 98.5 F (  36.9 C) (Oral)   Resp 15   SpO2 97%   Physical Exam Vitals and nursing note reviewed.  Constitutional:      General: She is not in acute distress.    Appearance: She is well-developed.  HENT:     Head: Normocephalic and atraumatic.     Nose: Nose normal.     Mouth/Throat:     Mouth: Mucous membranes are moist.  Eyes:     Extraocular Movements: Extraocular movements intact.     Conjunctiva/sclera: Conjunctivae normal.     Pupils: Pupils are equal, round, and reactive to light.  Cardiovascular:     Rate and Rhythm: Tachycardia present. Rhythm irregular.     Pulses: Normal pulses.      Heart sounds: Normal heart sounds. No murmur heard. Pulmonary:     Effort: Pulmonary effort is normal. No respiratory distress.     Breath sounds: Normal breath sounds.  Abdominal:     Palpations: Abdomen is soft.     Tenderness: There is no abdominal tenderness.  Musculoskeletal:        General: No swelling.     Cervical back: Neck supple.     Right lower leg: No edema.     Left lower leg: No edema.  Skin:    General: Skin is warm and dry.     Capillary Refill: Capillary refill takes less than 2 seconds.  Neurological:     General: No focal deficit present.     Mental Status: She is alert and oriented to person, place, and time.     Cranial Nerves: No cranial nerve deficit.     Sensory: No sensory deficit.     Motor: No weakness.     Coordination: Coordination normal.  Psychiatric:        Mood and Affect: Mood normal.     (all labs ordered are listed, but only abnormal results are displayed) Labs Reviewed  CBC WITH DIFFERENTIAL/PLATELET - Abnormal; Notable for the following components:      Result Value   RDW 16.4 (*)    Platelets 413 (*)    All other components within normal limits  COMPREHENSIVE METABOLIC PANEL WITH GFR - Abnormal; Notable for the following components:   Sodium 133 (*)    Chloride 90 (*)    Glucose, Bld 105 (*)    BUN 28 (*)    Creatinine, Ser 1.16 (*)    Calcium  10.7 (*)    GFR, Estimated 50 (*)    All other components within normal limits  PRO BRAIN NATRIURETIC PEPTIDE - Abnormal; Notable for the following components:   Pro Brain Natriuretic Peptide 3,636.0 (*)    All other components within normal limits  TROPONIN T, HIGH SENSITIVITY - Abnormal; Notable for the following components:   Troponin T High Sensitivity 42 (*)    All other components within normal limits  TROPONIN T, HIGH SENSITIVITY - Abnormal; Notable for the following components:   Troponin T High Sensitivity 38 (*)    All other components within normal limits    EKG: EKG  Interpretation Date/Time:  Wednesday September 02 2023 12:34:48 EDT Ventricular Rate:  122 PR Interval:    QRS Duration:  88 QT Interval:  328 QTC Calculation: 468 R Axis:   239  Text Interpretation: Atrial flutter Confirmed by Ruthe Cornet [phone]) on 09/02/2023 12:37:40 PM  Radiology: [facility] Chest Portable 1 View Result Date: 09/02/2023 EXAM: 1 VIEW XRAY OF THE CHEST 09/02/2023 12:54:00 PM COMPARISON: 08/27/2023 CLINICAL HISTORY: SOB.  Patient's provider said to come in because she is in kidney failure. She reports being on large dose Lasix  lately. FINDINGS: LUNGS AND PLEURA: Linear opacity at left lung base, likely atelectasis. No focal pulmonary opacity. No pulmonary edema. No pleural effusion. No pneumothorax. HEART AND MEDIASTINUM: Cardiomegaly. Atherosclerotic aortic calcifications. Left chest pacemaker with leads overlying the right atrium and right ventricle. BONES AND SOFT TISSUES: No acute osseous abnormality. IMPRESSION: 1. Cardiomegaly. 2. Linear opacity at left lung base, likely atelectasis. Electronically signed by: Waddell Calk MD 09/02/2023 01:31 PM EDT RP Workstation: [id number]     Procedures   Medications Ordered in the ED  metoprolol  tartrate (LOPRESSOR ) injection 2.5 mg (2.5 mg Intravenous Given 09/02/23 1314)  metoprolol  tartrate (LOPRESSOR ) injection 2.5 mg (2.5 mg Intravenous Given 09/02/23 1339)                                    Medical Decision Making Amount and/or Complexity of Data Reviewed Labs: ordered. Radiology: ordered.  Risk Prescription drug management.   Shannon [name] is here for abnormal kidney function.  Looks like her kidney function is 1.26 up from 0.8.  She looks to be in A-fib with slightly rapid rate here in the low 120s.  Will give her a dose of IV Lopressor  check basic labs again including BMP troponin and chest x-ray.  Her energy levels been low since discharge to the hospital where she is treated for volume overload.  Kidney function  was elevated and was sent here to get that rechecked.  While her energy levels been low her breathing she feels like is much better.  She took her metoprolol  earlier this morning.  When she was in the doctor's office today she was not in A-fib she has not noticed any A-fib rhythms on her heart monitor on her watch.  Overall per my review interpretation of labs no significant leukocytosis anemia or electrolyte abnormality.  Creatinine is improved to 1.16.  GFR is 50.  Her proBNP is back to baseline.  Troponin stable x 2.  She has responded well to IV Lopressor  which was given here.  Heart rates mostly in the low 100s.  She states that when she is in A-fib she is usually between 90 and 120.  She has an Scientist, physiological that monitors this as well.  Ultimately I think she is stable for discharge to home.  She understands to return if A-fib gets worse especially if is persistently above 150.  I think she should stay on her current medications.  I think her body is finding a new equilibrium.  She will continue follow-up with her primary care doctor and cardiology.  Patient discharged in good condition.  This chart was dictated using voice recognition software.  Despite best efforts to proofread,  errors can occur which can change the documentation meaning.      Final diagnoses:  Atrial fibrillation, unspecified type [facility])    ED Discharge Orders          Ordered    Amb Referral to AFIB Clinic        09/02/23 1447               Ruthe Cornet, DO 09/02/23 1448

## 2023-09-02 NOTE — Discharge Instructions (Addendum)
 Overall would follow-up with your cardiology team/A-fib clinic prior to your ablation to make sure you are fully optimized going into that procedure.  If you notice that your heart rate is over 140 for prolonged period of time or become symptomatic please return for evaluation.  I would just continue your medications as prescribed.  I think you are finding a new equilibrium.  Your labs are stable and improved.  But you can follow instructions further by your primary care doctor and cardiology otherwise.

## 2023-09-02 NOTE — ED Triage Notes (Signed)
 Patient's provider said to come in because she is in kidney failure. She reports being on large dose Lasix  lately.

## 2023-09-02 NOTE — Patient Instructions (Signed)
 Please go downstairs to ER for hypotension, dizziness, and concern for AKI

## 2023-09-02 NOTE — ED Notes (Signed)
 RN reviewed discharge instructions with pt. Pt verbalized understanding and had no further questions. VSS upon discharge.

## 2023-09-02 NOTE — Patient Instructions (Signed)
 Visit Information  Thank you for taking time to visit with me today. Please don't hesitate to contact me if I can be of assistance to you before our next scheduled telephone appointment.  Our next appointment is by telephone on 09/09/23 at 3 pm  Following is a copy of your care plan:   Goals Addressed             This Visit's Progress    VBCI Transitions of Care (TOC) Care Plan       Problems:  Recent Hospitalization for treatment of CHF Knowledge Deficit Related to management of heart failure/ atrial fibrillation/ HTN  Goal:  Over the next 30 days, the patient will not experience hospital readmission  Interventions:  AFIB Interventions:   Advised patient to discuss ongoing symptoms with provider Advised to seek emergency medical services for severe symptoms Advised to avoid caffeine Take medications as advised Heart Failure Interventions: Assessed need for readable accurate scales in home Advised patient to weigh each morning after emptying bladder Discussed importance of daily weight and advised patient to weigh and record daily Reviewed role of diuretics in prevention of fluid overload and management of heart failure; Discussed the importance of keeping all appointments with provider Assessed social determinant of health barriers   Hypertension Interventions: Last practice recorded BP readings:  BP Readings from Last 3 Encounters:  09/02/23 112/79  09/02/23 90/68  08/31/23 109/67   Most recent eGFR/CrCl:  Lab Results  Component Value Date   EGFR 72 08/19/2023    No components found for: CRCL  Evaluation of current treatment plan related to hypertension self management and patient's adherence to plan as established by provider Reviewed medications with patient and discussed importance of compliance Advised patient, providing education and rationale, to monitor blood pressure daily and record, calling PCP for findings outside established parameters Reviewed  scheduled/upcoming provider appointments including:   Patient Self Care Activities:  Attend all scheduled provider appointments Call pharmacy for medication refills 3-7 days in advance of running out of medications Call provider office for new concerns or questions  Take medications as prescribed    Plan:  Telephone follow up appointment with care management team member scheduled for:  09/09/23 at 3 pm        Patient verbalizes understanding of instructions and care plan provided today and agrees to view in MyChart. Active MyChart status and patient understanding of how to access instructions and care plan via MyChart confirmed with patient.     The patient has been provided with contact information for the care management team and has been advised to call with any health related questions or concerns.   Please call the care guide team at [phone] if you need to cancel or reschedule your appointment.   Please call the Suicide and Crisis Lifeline: 988 call the [place]  National Suicide Prevention Lifeline: 718-161-7931 or TTY: (808)555-0288 TTY [phone]) to talk to a trained counselor call 1-800-273-TALK (toll free, 24 hour hotline) go to [facility] [address] [place] [phone]) if you are experiencing a Mental Health or Behavioral Health Crisis or need someone to talk to.  Arvin Seip RN, BSN, CCM [employer], Population Health Case Manager Phone: [phone]

## 2023-09-04 ENCOUNTER — Encounter (HOSPITAL_BASED_OUTPATIENT_CLINIC_OR_DEPARTMENT_OTHER): Payer: Self-pay | Admitting: Family Medicine

## 2023-09-04 ENCOUNTER — Encounter: Payer: Self-pay | Admitting: Behavioral Health

## 2023-09-04 ENCOUNTER — Ambulatory Visit: Admitting: Behavioral Health

## 2023-09-04 DIAGNOSIS — F4323 Adjustment disorder with mixed anxiety and depressed mood: Secondary | ICD-10-CM | POA: Diagnosis not present

## 2023-09-04 NOTE — Progress Notes (Signed)
 [facility] Counselor/Therapist Progress Note  Patient ID: Shannon [name], MRN: [id number],    Date:,  8/29/ 2025  Time Spent: 54 minutes, 3:00pm until 3:54 PM this session was held via video teletherapy. The patient consented to the video teletherapy and was located in her home during this session. She is aware it is the responsibility of the patient to secure confidentiality on her end of the session. The provider was in a private home office for the duration of this session.     Reported Symptoms: Anxiety, depression  Mental Status Exam: Appearance:  Well Groomed     Behavior: Appropriate  Motor: Normal  Speech/Language:  Clear and Coherent  Affect: Appropriate for  Mood: normal  Thought process: normal  Thought content:   WNL  Sensory/Perceptual disturbances:   WNL  Orientation: oriented to person, place, time/date, situation, day of week, month of year, and year  Attention: Good  Concentration: Good  Memory: WNL  Fund of knowledge:  Good  Insight:   Good  Judgment:  Good  Impulse Control: Good   Risk Assessment: Danger to Self:  No Self-injurious Behavior: No Danger to Others: No Duty to Warn:no Physical Aggression / Violence:No  Access to Firearms a concern: No  Gang Involvement:No   Subjective: Since our last session the patient had to be hospitalized.  She struggled for several days now breathing very well.  She sent a MyChart message to the particular doctor who she was working with on a Monday.  When she had not heard back by Thursday she called back to the office and was told that he had written response but not scented back and was told to follow-up protocol to go to the emergency room if she needed to.  She did go to the emergency room where she was told she was in cardiac failure.  No significant amount of fluid around her heart and she spent several days in the hospital on strong Lasix  draining that fluid.  She did feel better but then when  she was leaving on the way home was called and told that she was in kidney failure which they later found to be because of draining all the fluid off her heart.  I am going to get blood work done for her kidneys at the urgent care at the request of her primary care physician the emergency room doctor thought she was having A-fib again and required more x-rays and more procedures and she had stay in the hospital a good part of that day which is exhausting to her.  About how she could have assertively handled that saying she had just been discharged in the hospital.  She is feeling better now that the fluid is gone is still scheduled for a procedure on September 9 which should help with the A-fib.  She says when she is struggling with motion now is not feeling regret and having bad feelings toward her ex-husband because of all that he did over the years that her they can a life that she knows she could have had.  We talked about how she can learn to let go of some of the bad feelings but also actively work to be able to forgive him.  She does contract for safety having no thoughts of hurting herself or anyone else. Interventions: Cognitive Behavioral Therapy  Diagnosis: Adjustment disorder with mixed anxiety and depressed mood.  Plan: I will meet with the patient every 2 weeks via video session TX. Plan:To  use cognitive behavioral therapy principles as well as elements of dialectical behavior therapy.  Goals are to reduce anxiety and depression by at least 50% with a target date of November 06, 2022.  Goals are to have less sadness as indicated by PHQ-9 scores as well as patient report.  We also work on improving mood and return to a healthier level of functioning as defined by her goals for being happy, identify causes and process triggers for depressed mood.  We will use cognitive behavioral therapy to explore and replace unhealthy thoughts and behavior patterns contributing to depression.  I will continue  to encourage shearing of feelings related to the causes and symptoms of depression as well as teach and encouraged use of coping skills for management of depressive symptoms.  We also will work to improve the patient's ability to manage anxiety symptoms and better handle stress, identify causes for anxiety and explore ways for reduction of anxiety in addition to resolving conflicts contributing to anxiety and managing thoughts and worrisome thinking is contributing to anxiety.  Interventions will include providing education about anxiety, facilitate problem solution skills, teaching coping skills for managing anxiety such as grounding exercises, progressive muscle relaxation and cognitive re framing etc.  We will also use cognitive behavioral therapy to identify and change anxiety provoking thoughts and behavior patterns as well as using DBT distress tolerance and mindfulness skills. I reviewed the treatment plan goals with the patient who agreed to continue with goals as stated above. Progress: 35%New target date will be October 31st, 2025. Lorrene CHRISTELLA Hasten, [facility]                                                Lorrene CHRISTELLA Hasten, [facility]               Lorrene CHRISTELLA Hasten, [facility]               Lorrene CHRISTELLA Hasten, [facility]               Lorrene CHRISTELLA Hasten, [facility]               Lorrene CHRISTELLA Hasten, [facility]               Lorrene CHRISTELLA Hasten, [facility]               Lorrene CHRISTELLA Hasten, [facility]               Lorrene CHRISTELLA Hasten, [facility]               Lorrene CHRISTELLA Hasten, [facility]               Lorrene CHRISTELLA Hasten, [facility]               Lorrene CHRISTELLA Hasten, [facility]               Lorrene CHRISTELLA Hasten, [facility]               Lorrene CHRISTELLA Hasten, [facility]

## 2023-09-07 ENCOUNTER — Encounter (HOSPITAL_BASED_OUTPATIENT_CLINIC_OR_DEPARTMENT_OTHER): Payer: Self-pay | Admitting: Family Medicine

## 2023-09-09 ENCOUNTER — Encounter (HOSPITAL_BASED_OUTPATIENT_CLINIC_OR_DEPARTMENT_OTHER): Payer: Self-pay | Admitting: Family Medicine

## 2023-09-09 ENCOUNTER — Other Ambulatory Visit: Payer: Self-pay

## 2023-09-09 NOTE — Patient Instructions (Signed)
 Visit Information  Thank you for taking time to visit with me today. Please don't hesitate to contact me if I can be of assistance to you before our next scheduled telephone appointment.  Our next appointment is by telephone on 09/17/23 at 10 am  Following is a copy of your care plan:   Goals Addressed             This Visit's Progress    VBCI Transitions of Care (TOC) Care Plan       Problems:  Recent Hospitalization for treatment of CHF Knowledge Deficit Related to management of heart failure/ atrial fibrillation/ HTN  Goal:  Over the next 30 days, the patient will not experience hospital readmission  Interventions:  AFIB Interventions: Assessed for atrial fibrillation symptoms.  Advised to seek emergency medical services for severe symptoms Assessed pulse readings Advised to avoid caffeine Take medications as advised Heart Failure Interventions: Advised patient to weigh each morning after emptying bladder Discussed importance of daily weight and advised patient to weigh and record daily Assessed for heart failure symptoms.  Advised to notify provider for 3 lb weight gain overnight or 5 lbs in a week, increase swelling in feet, legs, abdomen, hands, increase SOB. Confirmed patient taking her lasix  as prescribed Advised to notify provider for new/ ongoing symptoms Assessed weight Hypertension Interventions: Last practice recorded BP readings:  BP Readings from Last 3 Encounters:  09/02/23 112/79  09/02/23 90/68  08/31/23 109/67   Most recent eGFR/CrCl:  Lab Results  Component Value Date   EGFR 72 08/19/2023    No components found for: CRCL  Evaluation of current treatment plan related to hypertension self management and patient's adherence to plan as established by provider Reviewed medications with patient and discussed importance of compliance Advised patient, providing education and rationale, to monitor blood pressure daily and record, calling PCP for findings  outside established parameters Reviewed scheduled/upcoming provider appointments including:  Assessed recent blood pressure readings Continue to take medications as prescribed.    Patient Self Care Activities:  Call pharmacy for medication refills 3-7 days in advance of running out of medications Call provider office for new concerns or questions  Take medications as prescribed   Continue to monitor blood pressure, pulse and weight daily and record. Take readings to provider visits.  Notify provider for increase weight gain of 3 lbs overnight or 5 lbs in a week, increase in swelling in feet, legs, abdomen, hands or increase in SOB.   Plan:  Telephone follow up appointment with care management team member scheduled for:  09/17/23 at 11am        Patient verbalizes understanding of instructions and care plan provided today and agrees to view in MyChart. Active MyChart status and patient understanding of how to access instructions and care plan via MyChart confirmed with patient.     The patient has been provided with contact information for the care management team and has been advised to call with any health related questions or concerns.   Please call the care guide team at [phone] if you need to cancel or reschedule your appointment.   Please call the Suicide and Crisis Lifeline: 988 call the [place]  National Suicide Prevention Lifeline: (250)240-8441 or TTY: 4036561251 TTY (713)075-3299) to talk to a trained counselor call 1-800-273-TALK (toll free, 24 hour hotline) go to [facility] [address] [employer] [phone]) if you are experiencing a Mental Health or Behavioral Health Crisis or need someone to talk to.  Arvin Seip RN, BSN, CCM [employer], Population Health Case Manager Phone: [phone]

## 2023-09-09 NOTE — Transitions of Care (Post Inpatient/ED Visit) (Signed)
 Transition of Care week 2  Visit Note  09/09/2023  Name: Shannon [name] MRN: [id number]          DOB: 03/15/1950  Situation: Patient enrolled in [facility] 30-day program. Visit completed with patient by telephone.   Background: hospitalized from 08/27/23 to 08/31/23 for Acute on chronic systolic CHF (congestive heart failure)       Past Medical History:  Diagnosis Date   Acute systolic heart failure (HCC) 07/08/2023   Allergy    Anemia    Anxiety    Arthritis 1990's   neck and lower back (03/25/2016)   Asthma 1990s X 1   short term inhaler use    CAD (coronary artery disease)    Cardiac pacemaker in situ    MDT   CHF (congestive heart failure) (HCC)    Chronic lower back pain    COVID-19 03/06/2022   Degenerative disorder of bone    Depression    Diastolic dysfunction    Drug-induced lupus erythematosus    HCTZ induced; still gettin over it (03/25/2016)   Foot swelling 05/19/2022   GERD (gastroesophageal reflux disease) 1990's   Headache, unspecified headache type 03/06/2022   Herniated disc, cervical    Hospital discharge follow-up 03/14/2022   Hyperlipidemia    Hypertension    Neuromuscular disorder (HCC)    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 HTN   Obesity (BMI 30-39.9) 11/07/2022   Orthostatic hypotension    OSA on CPAP    Osteopenia    Osteoporosis 2012   PAF (paroxysmal atrial fibrillation) (HCC)    Paroxysmal atrial fibrillation (HCC) 12/13/2021   Phlebitis after infusion 03/18/2023   Pinched nerve in neck    PND (post-nasal drip) 12/18/2022   Rapid atrial fibrillation (HCC) 07/06/2023   Sinus node dysfunction (HCC)    Sinus pressure 12/18/2022   Sleep apnea 1990's   wears CPAP   Stroke (HCC)    T12 compression fracture (HCC) 11/2015   Vitamin D  deficiency    Whiplash injury 06/07/2010    Assessment: Patient Reported Symptoms: Cognitive Cognitive Status: No symptoms reported, Normal speech and language skills, Alert and  oriented to person, place, and time, Insightful and able to interpret abstract concepts      Neurological Neurological Review of Symptoms: Vision changes Neurological Management Strategies: Routine screening Neurological Comment: patient denies any changes in vision. she states she continues to have blurred vision on occasion. She states her doctor is aware.  HEENT HEENT Symptoms Reported: Other: HEENT Comment: patient states she still has an occasional hoarseness in throat.    Cardiovascular Cardiovascular Symptoms Reported: Dizziness, Lightheadness Other Cardiovascular Symptoms: patient states she still has occasional dizziness and lightheadedness when standing up or walking. She reports using walker for safety.  Patient states her primary care provider is aware Does patient have uncontrolled Hypertension?: Yes Patient's Recent BP reading at home: patient reports most recent blood sugar readings are: 156/82, 154/85, 142/73, she reports pulse is ranging in the 60-70's    Respiratory Respiratory Symptoms Reported: No symptoms reported    Endocrine Endocrine Symptoms Reported: No symptoms reported    Gastrointestinal Gastrointestinal Symptoms Reported: No symptoms reported Additional Gastrointestinal Details: patient reports her nausea has resolved and her appetite is better.      Genitourinary Genitourinary Symptoms Reported: No symptoms reported    Integumentary Integumentary Symptoms Reported: No symptoms reported    Musculoskeletal Musculoskelatal Symptoms Reviewed: Weakness, Difficulty walking, Unsteady gait Additional Musculoskeletal Details: patient states she continues to  use her walker for ambulation. She states she feels she is getting stronger since she is now able to eat.  She reports balance is a little better.        Psychosocial Psychosocial Symptoms Reported: Depression - if selected complete PHQ 2-9 Additional Psychological Details: patient states this is ongoing for  her however it is being managed with medication and weekly therapy sessions.         There were no vitals filed for this visit.  Medications Reviewed Today     Reviewed by Minha Fulco E, RN (Registered Nurse) on 09/09/23 at 1539  Med List Status: <None>   Medication Order Taking? Sig Documenting Provider Last Dose Status Informant  acetaminophen  (TYLENOL ) 500 MG tablet [id number] Yes Take 1,000 mg by mouth every 6 (six) hours as needed for moderate pain or headache. [provider]  Active Self, Pharmacy Records  busPIRone  (BUSPAR ) 5 MG tablet [id number] Yes Take 1 tablet (5 mg total) by mouth 2 (two) times daily. [name], NP  Active Self, Pharmacy Records  Cholecalciferol  (VITAMIN D ) 50 MCG (2000 UT) CAPS [id number] Yes Take 2,000 Units by mouth in the morning. [provider]  Active Self, Pharmacy Records  ELIQUIS  5 MG TABS tablet [id number] Yes TAKE 1 TABLET BY MOUTH TWICE  DAILY [name], MD  Active Self, Pharmacy Records  empagliflozin  (JARDIANCE ) 10 MG TABS tablet [id number] Yes Take 1 tablet (10 mg total) by mouth daily before breakfast. Darryle Thom CROME, PA-C  Active Self, Pharmacy Records  furosemide  (LASIX ) 20 MG tablet [id number] Yes Take 2 tablets (40 mg total) by mouth daily. In case of weight gain 2 to 3 lbs in 24 hrs or 4 lbs in 7 days take 40 mg twice daily until weight back to baseline Arrien, Elidia Sieving, MD  Active   Hydrocortisone [facility] [place]) [id number] Yes Apply 1 application  topically as needed (skin irritation/itching). [provider]  Active Self, Pharmacy Records  levalbuterol  (XOPENEX  HFA) 45 MCG/ACT inhaler [id number] Yes Inhale 2 puffs into the lungs every 4 (four) hours as needed for wheezing. [name], NP  Active Self, Pharmacy Records  metoprolol  succinate (TOPROL -XL) 100 MG 24 hr tablet [id number] Yes Take 1 tablet (100 mg total) by mouth daily. Take with or immediately following a meal. Arrien, Elidia Sieving,  MD  Active   Multiple Vitamin (MULTIVITAMIN) tablet [id number] Yes Take 1 tablet by mouth at bedtime. [provider]  Active Self, Pharmacy Records  olmesartan  (BENICAR ) 20 MG tablet [id number] Yes Take 2 tablets (40 mg total) by mouth daily. Arrien, Mauricio Daniel, MD  Active   potassium chloride  SA (KLOR-CON  M) 20 MEQ tablet [id number] Yes Take 1 tablet (20 mEq total) by mouth daily. Take with furosemide . Arrien, Elidia Sieving, MD  Active   simvastatin  (ZOCOR ) 20 MG tablet [id number] Yes TAKE 1 TABLET BY MOUTH DAILY [name], NP  Active Self, Pharmacy Records            Recommendation:   Continue Current Plan of Care  Follow Up Plan:   Telephone follow-up in 1 week 09/17/23 at 10 am  Arvin Seip RN, BSN, CCM [place]  [facility], Population Health Case Manager Phone: [phone]

## 2023-09-09 NOTE — Progress Notes (Signed)
   ADVANCED HEART FAILURE CLINIC NOTE  Referring Physician: Knute [name], *  Primary Care: Knute [name], FNP Primary Cardiologist: Heart Failure: Ria Commander, DO  CC: HFrEF  HPI: Shannon [name] is a 73 y.o. female with atrial fibrillation, HFrEF, SSS, COPD presenting today to establish care after recent hospitalization.   Pertinent Family & social hx:   Cardiac History:  - Afib diagnosed in 2017 with PVI ablations in 2018, 2022 - CVA in 2/23, Xarelto  discontinued & transition to apixaban .  - Tikosyn  2023 discontinued due to QT prolongation; stopped amio for blurred vision.  - Admitted 8/25 with atrial fibrillation. TTE w/ LVEF 30-35%. Long history of atrial fibrillation with intolerance to anti-arryhtmics.  - AVN ablation on 09/15/23   Interval hx:        PHYSICAL EXAM: There were no vitals filed for this visit. Lungs- *** CARDIAC:  JVP: *** cm          Normal rate with regular rhythm. *** murmur.  Pulses ***. *** edema.  ABDOMEN: soft EXTREMITIES: Warm and well perfused.   DATA REVIEW  ECG: 09/02/23: atrial fibrillation as per my personal interpretation  ECHO: 07/07/23: LVEF 30-35%, normal RV function. Global hypokinesis.    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 She had a PET perfusion in 06/17/23 with no evidence of ischemia or infarction. Moderate coronary calcifications noted on CT imaging. Low risk study overall despite reduced EF (38%).  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  - Advanced therapies: Not a candidate due to age; functional status ***  2. Atrial fibrillation  - As noted above; very  symptomatic / difficult to control afib w/ associated tachy-brady now s/p dcPPM in 3/25.  - Unable to tolerate multiple anti-arrythmics.  - Planning for AVN ablation in 9/25; she has LBBP.   I spent *** minutes in the care of Shannon [name] today including {CHL AMB CAR Time Based Billing Options STW (Optional):[phone]::documenting in the encounter.}   Shannon [name] Advanced Heart Failure Mechanical Circulatory Support

## 2023-09-09 NOTE — Telephone Encounter (Signed)
 Please see mychart message sent by pt as an FYI.

## 2023-09-10 ENCOUNTER — Ambulatory Visit (HOSPITAL_COMMUNITY)
Admission: RE | Admit: 2023-09-10 | Discharge: 2023-09-10 | Disposition: A | Discharge status: Home / Self Care | Source: Ambulatory Visit | Attending: Cardiology | Admitting: Cardiology

## 2023-09-10 ENCOUNTER — Encounter (HOSPITAL_COMMUNITY): Payer: Self-pay | Admitting: Cardiology

## 2023-09-10 VITALS — BP 162/92 | HR 87 | Ht 67.0 in | Wt 184.8 lb

## 2023-09-10 DIAGNOSIS — Z7901 Long term (current) use of anticoagulants: Secondary | ICD-10-CM | POA: Insufficient documentation

## 2023-09-10 DIAGNOSIS — I5022 Chronic systolic (congestive) heart failure: Secondary | ICD-10-CM | POA: Insufficient documentation

## 2023-09-10 DIAGNOSIS — Z950 Presence of cardiac pacemaker: Secondary | ICD-10-CM

## 2023-09-10 DIAGNOSIS — D6869 Other thrombophilia: Secondary | ICD-10-CM | POA: Diagnosis not present

## 2023-09-10 DIAGNOSIS — J449 Chronic obstructive pulmonary disease, unspecified: Secondary | ICD-10-CM | POA: Insufficient documentation

## 2023-09-10 DIAGNOSIS — I5032 Chronic diastolic (congestive) heart failure: Secondary | ICD-10-CM

## 2023-09-10 DIAGNOSIS — I428 Other cardiomyopathies: Secondary | ICD-10-CM | POA: Insufficient documentation

## 2023-09-10 DIAGNOSIS — I495 Sick sinus syndrome: Secondary | ICD-10-CM | POA: Insufficient documentation

## 2023-09-10 DIAGNOSIS — I4891 Unspecified atrial fibrillation: Secondary | ICD-10-CM | POA: Insufficient documentation

## 2023-09-10 LAB — BRAIN NATRIURETIC PEPTIDE: B Natriuretic Peptide: 1190.5 pg/mL — ABNORMAL HIGH (ref 0.0–100.0)

## 2023-09-10 LAB — BASIC METABOLIC PANEL WITH GFR
Anion gap: 15 (ref 5–15)
BUN: 14 mg/dL (ref 8–23)
CO2: 25 mmol/L (ref 22–32)
Calcium: 9.5 mg/dL (ref 8.9–10.3)
Chloride: 104 mmol/L (ref 98–111)
Creatinine, Ser: 0.89 mg/dL (ref 0.44–1.00)
GFR, Estimated: >60 mL/min (ref >60)
Glucose, Bld: 106 mg/dL — ABNORMAL HIGH (ref 70–99)
Potassium: 4.3 mmol/L (ref 3.5–5.1)
Sodium: 144 mmol/L (ref 135–145)

## 2023-09-10 MED ORDER — AMLODIPINE BESYLATE 5 MG PO TABS: 5.0000 mg | ORAL_TABLET | Freq: Every day | ORAL | 3 refills | Status: AC

## 2023-09-10 NOTE — Patient Instructions (Signed)
 START Amlodipine  5 mg daily.  Labs done today, your results will be available in MyChart, we will contact you for abnormal readings.  Your physician recommends that you schedule a follow-up appointment in: 1 month   If you have any questions or concerns before your next appointment please send us  a message through [place] or call our office at [phone].    TO LEAVE A MESSAGE FOR THE NURSE SELECT OPTION 2, PLEASE LEAVE A MESSAGE INCLUDING: YOUR NAME DATE OF BIRTH CALL BACK NUMBER REASON FOR CALL**this is important as we prioritize the call backs  YOU WILL RECEIVE A CALL BACK THE SAME DAY AS LONG AS YOU CALL BEFORE 4:00 PM  At the Advanced Heart Failure Clinic, you and your health needs are our priority. As part of our continuing mission to provide you with exceptional heart care, we have created designated Provider Care Teams. These Care Teams include your primary Cardiologist (physician) and Advanced Practice Providers (APPs- Physician Assistants and Nurse Practitioners) who all work together to provide you with the care you need, when you need it.   You may see any of the following providers on your designated Care Team at your next follow up: Dr Toribio Fuel Dr Ezra Shuck Dr. Ria Commander Dr. Morene Brownie Amy Lenetta, NP Caffie Shed, [place] [facility] [place], [place] Beckey Coe, NP [place] Lee, NP Ellouise Class, NP Tinnie Redman, PharmD Jaun Bash, PharmD   Please be sure to bring in all your medications bottles to every appointment.    Thank you for choosing [facility]

## 2023-09-11 ENCOUNTER — Encounter: Payer: Self-pay | Admitting: Behavioral Health

## 2023-09-11 ENCOUNTER — Ambulatory Visit: Admitting: Behavioral Health

## 2023-09-11 DIAGNOSIS — F4323 Adjustment disorder with mixed anxiety and depressed mood: Secondary | ICD-10-CM

## 2023-09-11 NOTE — Progress Notes (Signed)
 [facility] Counselor/Therapist Progress Note  Patient ID: Shannon [name], MRN: [id number],    Date: September 11, 2023  Time Spent: 54 minutes, 3:00pm until 3:54 PM this session was held via video teletherapy. The patient consented to the video teletherapy and was located in her home during this session. She is aware it is the responsibility of the patient to secure confidentiality on her end of the session. The provider was in a private home office for the duration of this session.     Reported Symptoms: Anxiety, depression  Mental Status Exam: Appearance:  Well Groomed     Behavior: Appropriate  Motor: Normal  Speech/Language:  Clear and Coherent  Affect: Appropriate for  Mood: normal  Thought process: normal  Thought content:   WNL  Sensory/Perceptual disturbances:   WNL  Orientation: oriented to person, place, time/date, situation, day of week, month of year, and year  Attention: Good  Concentration: Good  Memory: WNL  Fund of knowledge:  Good  Insight:   Good  Judgment:  Good  Impulse Control: Good   Risk Assessment: Danger to Self:  No Self-injurious Behavior: No Danger to Others: No Duty to Warn:no Physical Aggression / Violence:No  Access to Firearms a concern: No  Gang Involvement:No   Subjective: The patient saw a new cardiac specific doctor this week and feels the first time she was heard.  She said he understood what she had been through and is starting to make some very subtle changes and she is already feeling better.  Back to an old blood pressure medicine which she had success with in the past and already that has gone down yesterday and stabilized today.  She still is planning on having the medical procedure next week but this week has felt better including having more energy and feeling like she can be productive around the house.  She is sleeping better.  She did go to [place]  last week and did not go to the gathering for her deceased  husband but did spend time with her close friend as well as her biological sister and brother and his wife.  Those visits went well and she is starting to feel like she is making progress mentally and emotionally.  She feels that her brother and sister understand and know where she is coming from now.  She is going to be very intentional about spending more time with her friend who lives in [place] .  Things are well with her children and grandchildren and she is enjoying time with them. She does contract for safety having no thoughts of hurting herself or anyone else. Interventions: Cognitive Behavioral Therapy  Diagnosis: Adjustment disorder with mixed anxiety and depressed mood.  Plan: I will meet with the patient every 2 weeks via video session TX. Plan:To use cognitive behavioral therapy principles as well as elements of dialectical behavior therapy.  Goals are to reduce anxiety and depression by at least 50% with a target date of November 06, 2022.  Goals are to have less sadness as indicated by PH-9 scores as well as patient report.  We also work on improving mood and return to a healthier level of functioning as defined by her goals for being happy, identify causes and process triggers for depressed mood.  We will use cognitive behavioral therapy to explore and replace unhealthy thoughts and behavior patterns contributing to depression.  I will continue to encourage shearing of feelings related to the causes and symptoms of depression as well as  teach and encouraged use of coping skills for management of depressive symptoms.  We also will work to improve the patient's ability to manage anxiety symptoms and better handle stress, identify causes for anxiety and explore ways for reduction of anxiety in addition to resolving conflicts contributing to anxiety and managing thoughts and worrisome thinking is contributing to anxiety.  Interventions will include providing education about anxiety, facilitate  problem solution skills, teaching coping skills for managing anxiety such as grounding exercises, progressive muscle relaxation and cognitive re framing etc.  We will also use cognitive behavioral therapy to identify and change anxiety provoking thoughts and behavior patterns as well as using DBT distress tolerance and mindfulness skills. I reviewed the treatment plan goals with the patient who agreed to continue with goals as stated above. Progress: 35%New target date will be October 31st, 2025. Lorrene CHRISTELLA Hasten, [facility]                                                Lorrene CHRISTELLA Hasten, [facility]               Lorrene CHRISTELLA Hasten, [facility]               Lorrene CHRISTELLA Hasten, [facility]               Lorrene CHRISTELLA Hasten, [facility]               Lorrene CHRISTELLA Hasten, [facility]               Lorrene CHRISTELLA Hasten, [facility]               Lorrene CHRISTELLA Hasten, [facility]               Lorrene CHRISTELLA Hasten, [facility]               Lorrene CHRISTELLA Hasten, [facility]               Lorrene CHRISTELLA Hasten, [facility]               Lorrene CHRISTELLA Hasten, [facility]               Lorrene CHRISTELLA Hasten, [facility]               Lorrene CHRISTELLA Hasten, [facility]               Lorrene CHRISTELLA Hasten, [facility]

## 2023-09-14 NOTE — Pre-Procedure Instructions (Signed)
 Attempted to call patient regarding procedure instructions.  Left voicemail on the following items: Arrival time 1000 Nothing to eat or drink after midnight No meds AM of procedure Responsible person to drive you home and stay with you for 24 hrs  Have you missed any doses of anti-coagulant Eliquis - should be taken twice a day,  let us  know if you have missed any doses.  Don't take dose morning of procedure.

## 2023-09-15 ENCOUNTER — Encounter (HOSPITAL_COMMUNITY): Payer: Self-pay

## 2023-09-15 ENCOUNTER — Other Ambulatory Visit: Payer: Self-pay

## 2023-09-15 ENCOUNTER — Encounter (HOSPITAL_COMMUNITY)
Admission: RE | Disposition: A | Discharge status: Home / Self Care | Payer: Self-pay | Source: Home / Self Care | Attending: Cardiology

## 2023-09-15 ENCOUNTER — Ambulatory Visit (HOSPITAL_COMMUNITY)
Admission: RE | Admit: 2023-09-15 | Discharge: 2023-09-15 | Disposition: A | Discharge status: Home / Self Care | Attending: Cardiology | Admitting: Cardiology

## 2023-09-15 DIAGNOSIS — I5020 Unspecified systolic (congestive) heart failure: Secondary | ICD-10-CM | POA: Diagnosis not present

## 2023-09-15 DIAGNOSIS — I4891 Unspecified atrial fibrillation: Secondary | ICD-10-CM | POA: Diagnosis not present

## 2023-09-15 DIAGNOSIS — I4819 Other persistent atrial fibrillation: Secondary | ICD-10-CM | POA: Diagnosis not present

## 2023-09-15 DIAGNOSIS — Z950 Presence of cardiac pacemaker: Secondary | ICD-10-CM | POA: Insufficient documentation

## 2023-09-15 HISTORY — PX: AV NODE ABLATION: EP1193

## 2023-09-15 SURGERY — AV NODE ABLATION
Anesthesia: General

## 2023-09-15 MED ORDER — SODIUM CHLORIDE 0.9 % IV SOLN: 250.0000 mL | INTRAVENOUS | Status: DC | PRN

## 2023-09-15 MED ORDER — LIDOCAINE HCL (PF) 1 % IJ SOLN: INTRAMUSCULAR | Status: DC | PRN

## 2023-09-15 MED ORDER — ONDANSETRON HCL 4 MG/2ML IJ SOLN: 4.0000 mg | Freq: Four times a day (QID) | INTRAMUSCULAR | Status: DC | PRN

## 2023-09-15 MED ORDER — CEFAZOLIN SODIUM-DEXTROSE 2-4 GM/100ML-% IV SOLN
INTRAVENOUS | Status: AC
  Administered 2023-09-15: 2 g
  Filled 2023-09-15: qty 100

## 2023-09-15 MED ORDER — FENTANYL CITRATE (PF) 100 MCG/2ML IJ SOLN
INTRAMUSCULAR | Status: AC
  Filled 2023-09-15: qty 2

## 2023-09-15 MED ORDER — SODIUM CHLORIDE 0.9% FLUSH: 3.0000 mL | INTRAVENOUS | Status: DC | PRN

## 2023-09-15 MED ORDER — SODIUM CHLORIDE 0.9% FLUSH: 3.0000 mL | Freq: Two times a day (BID) | INTRAVENOUS | Status: DC

## 2023-09-15 MED ORDER — APIXABAN 5 MG PO TABS
5.0000 mg | ORAL_TABLET | Freq: Two times a day (BID) | ORAL | Status: DC
  Administered 2023-09-15: 5 mg via ORAL
  Filled 2023-09-15: qty 1

## 2023-09-15 MED ORDER — ACETAMINOPHEN 325 MG PO TABS: 650.0000 mg | ORAL_TABLET | ORAL | Status: DC | PRN

## 2023-09-15 MED ORDER — HEPARIN SODIUM (PORCINE) 1000 UNIT/ML IJ SOLN
INTRAMUSCULAR | Status: DC | PRN
  Administered 2023-09-15: 1000 [IU] via INTRAVENOUS

## 2023-09-15 MED ORDER — BUPIVACAINE HCL (PF) 0.25 % IJ SOLN
INTRAMUSCULAR | Status: DC | PRN
  Administered 2023-09-15: 30 mL

## 2023-09-15 MED ORDER — MIDAZOLAM HCL 5 MG/5ML IJ SOLN
INTRAMUSCULAR | Status: DC | PRN
  Administered 2023-09-15: .5 mg via INTRAVENOUS

## 2023-09-15 MED ORDER — HEPARIN (PORCINE) IN NACL 1000-0.9 UT/500ML-% IV SOLN
INTRAVENOUS | Status: DC | PRN
  Administered 2023-09-15: 500 mL

## 2023-09-15 MED ORDER — SODIUM CHLORIDE 0.9 % IV SOLN: INTRAVENOUS | Status: DC

## 2023-09-15 MED ORDER — HEPARIN SODIUM (PORCINE) 1000 UNIT/ML IJ SOLN
INTRAMUSCULAR | Status: AC
  Filled 2023-09-15: qty 10

## 2023-09-15 MED ORDER — BUPIVACAINE HCL (PF) 0.25 % IJ SOLN
INTRAMUSCULAR | Status: AC
  Filled 2023-09-15: qty 30

## 2023-09-15 MED ORDER — FENTANYL CITRATE (PF) 100 MCG/2ML IJ SOLN
INTRAMUSCULAR | Status: DC | PRN
  Administered 2023-09-15: 12.5 ug via INTRAVENOUS

## 2023-09-15 MED ORDER — MIDAZOLAM HCL 5 MG/5ML IJ SOLN
INTRAMUSCULAR | Status: AC
  Filled 2023-09-15: qty 5

## 2023-09-15 SURGICAL SUPPLY — 7 items
CATH SMTCH THERMOCOOL SF FJ (CATHETERS) IMPLANT
CLOSURE PERCLOSE PROSTYLE (VASCULAR PRODUCTS) IMPLANT
PACK EP LF (CUSTOM PROCEDURE TRAY) ×1 IMPLANT
PAD DEFIB RADIO PHYSIO CONN (PAD) ×1 IMPLANT
PATCH CARTO3 (PAD) IMPLANT
SHEATH PINNACLE 8F 10CM (SHEATH) IMPLANT
TUBING SMART ABLATE COOLFLOW (TUBING) IMPLANT

## 2023-09-15 NOTE — Progress Notes (Signed)
 Patient ambulated in hall to [facility]. Right groin remains unremarkable.

## 2023-09-15 NOTE — H&P (Signed)
 Electrophysiology Office Follow up Visit Note:     Date:  09/15/2023    ID:  Shannon [name], DOB 03-04-1950, MRN [id number]   PCP:  Knute [name], FNP        [facility] Cardiologist:  None  [facility] Electrophysiologist:  OLE ONEIDA HOLTS, MD      Interval History:       Shannon [name] is a 73 y.o. female who presents for a follow up visit.    She is well-known to me from the outpatient setting.  She has a long history of atrial fibrillation with multiple prior catheter ablations.  She has previously failed Tikosyn  and amiodarone .  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  Her episodes of atrial fibrillation are highly symptomatic.  She last saw Elvie on June 09, 2023.  At that appointment she reported intermittent chest pressure and shortness of breath with exertion.  A PET stress was ordered.   [name] is with her son today in clinic.  She has felt poorly for the last 3 weeks because of a viral illness.  She still has a residual cough productive of phlegm.  She is slowly improving but still feels weak.  She has been in normal rhythm for several days now but did have nearly 2 weeks straight of arrhythmia in the setting of the illness.  She presents for AV junction ablation. '    Objective Past medical, surgical, social and family history were reviewed.   ROS:   Please see the history of present illness.    All other systems reviewed and are negative.   EKGs/Labs/Other Studies Reviewed:     The following studies were reviewed today:   July 07, 2023 echo EF 30-35 RV normal Moderately dilated left and right atrium   July 22, 2023 in-clinic device interrogation personally reviewed Battery longevity 13.6 years Lead parameters stable Increased A-fib burden over the last few weeks, ventricular rates 100-170 during A-fib Atrial pacing 52% Ventricular pacing 0.2% No programming changes made today   July 21, 2023 EKG shows an atrial paced  rhythm.  Narrow QRS.       Physical Exam:     VS:  BP 182/88 (BP Location: Left Arm, Patient Position: Sitting, Cuff Size: Normal)   Pulse 93   Ht 5' 7 (1.702 m)   Wt 192 lb (87.1 kg)   SpO2 97%   BMI 30.07 kg/m         Wt Readings from Last 3 Encounters:  07/22/23 192 lb (87.1 kg)  07/21/23 190 lb (86.2 kg)  07/13/23 190 lb 14.4 oz (86.6 kg)      GEN: no distress CARD: RRR, No MRG.  Prepectoral pocket well-healed. RESP: No IWOB.  Some crackles/rhonchi in bilateral lower lung fields.     Assessment ASSESSMENT:     1. Persistent atrial fibrillation (HCC)   2. HFrEF (heart failure with reduced ejection fraction) (HCC)     PLAN:     In order of problems listed above:   #Persistent atrial fibrillation Recurrent episodes despite multiple prior catheter ablation.  Previously failed amiodarone  and Tikosyn .  Her episodes are highly symptomatic.  We discussed treatment options.  I do not think a repeat catheter ablation is the next right step.   I discussed treatment options including trialing antiarrhythmic drugs again versus pursuing AV nodal ablation.  She has a left bundle lead in place and had a good LVAT at the time of implant.  I  discussed the potential need for future upgrade of her device to CRT.  I discussed upgrading it at the time of AV junction ablation versus waiting and letting her pace from the left bundle lead with close monitoring of her ejection fraction.      Discussed treatment options today for AF including antiarrhythmic drug therapy and ablation. Discussed AV junction ablation. Discussed risks, recovery and likelihood of success with each treatment strategy. Risk, benefits, and alternatives to AV junction ablation were discussed. These risks include but are not limited to stroke, bleeding, vascular damage, tamponade, perforation, worsening renal function, coronary vasospasm and death.  The patient understands she may later need upgrade of her pacemaker system  to a CRT system. The patient understands these risk and wishes to proceed.   Presents for AVJ ablation. Procedure reviewed.     Signed, Ole Holts, MD, [facility], [facility] 09/15/2023 Electrophysiology [facility]

## 2023-09-15 NOTE — Discharge Instructions (Signed)

## 2023-09-16 ENCOUNTER — Encounter (HOSPITAL_COMMUNITY): Payer: Self-pay | Admitting: Cardiology

## 2023-09-16 ENCOUNTER — Telehealth (HOSPITAL_COMMUNITY): Payer: Self-pay

## 2023-09-16 NOTE — Telephone Encounter (Addendum)
 Spoke with patient to complete post procedure follow up call.  Patient reports no complications with groin sites other than experiencing a burning sensation in the right groin when positioning in a squatting position to sit down. States she called the after-hours line and was instructed to take OTC- Tylenol  and apply a warm compress but she has not tried this yet but is willing too if discomfort continues. She denies any additional symptoms.   Instructions reviewed with patient:  It is normal to have bruising, tenderness, mild swelling, and a pea or marble sized lump/knot at the groin site which can take up to three months to resolve.  Get help right away if you notice sudden swelling at the puncture site. Remove large bandage at puncture site after 24 hours. Check your puncture site every day for signs of infection: fever, redness, swelling, pus drainage, warmth, foul odor or excessive pain. If this occurs, please call [phone], to speak with the RN Navigator. Get help right away if your puncture site is bleeding and the bleeding does not stop after applying firm pressure to the area.  You may continue to have skipped beats/ atrial fibrillation during the first several months after your procedure.  It is very important not to miss any doses of your blood thinner Eliquis .    You will follow up with the APP on 10/15/23.   Patient verbalized understanding to all instructions provided.

## 2023-09-18 ENCOUNTER — Ambulatory Visit: Admitting: Behavioral Health

## 2023-09-18 ENCOUNTER — Encounter: Payer: Self-pay | Admitting: Behavioral Health

## 2023-09-18 ENCOUNTER — Other Ambulatory Visit: Payer: Self-pay

## 2023-09-18 DIAGNOSIS — F4323 Adjustment disorder with mixed anxiety and depressed mood: Secondary | ICD-10-CM | POA: Diagnosis not present

## 2023-09-18 NOTE — Progress Notes (Signed)
 [facility] Counselor/Therapist Progress Note  Patient ID: Shannon [name], MRN: [id number],    Date: September 18, 2023  Time Spent: 56 minutes, 3:00pm until 3:56 PM this session was held via video teletherapy. The patient consented to the video teletherapy and was located in her home during this session. She is aware it is the responsibility of the patient to secure confidentiality on her end of the session. The provider was in a private home office for the duration of this session.     Reported Symptoms: Anxiety, depression  Mental Status Exam: Appearance:  Well Groomed     Behavior: Appropriate  Motor: Normal  Speech/Language:  Clear and Coherent  Affect: Appropriate for  Mood: normal  Thought process: normal  Thought content:   WNL  Sensory/Perceptual disturbances:   WNL  Orientation: oriented to person, place, time/date, situation, day of week, month of year, and year  Attention: Good  Concentration: Good  Memory: WNL  Fund of knowledge:  Good  Insight:   Good  Judgment:  Good  Impulse Control: Good   Risk Assessment: Danger to Self:  No Self-injurious Behavior: No Danger to Others: No Duty to Warn:no Physical Aggression / Violence:No  Access to Firearms a concern: No  Gang Involvement:No   Subjective: The patient had her ablation Tuesday and both she and the doctors felt it went well.  He is still sore from where they made the incision but said that is getting better and thinks it may just be some nerve pain.  She says she already feels better and her blood pressure has gone down.  Is still just above normal but she feels confident he will continue to work toward a normal level.  She is breathing easier.  She still cannot lift anything for another week but is looking forward to getting back to what feels like normal.  She still feels that she is dealing with the grief/loss of her husband fairly well.  Continues to work on forgiveness.  She does  contract for safety having no thoughts of hurting herself or anyone else. Interventions: Cognitive Behavioral Therapy  Diagnosis: Adjustment disorder with mixed anxiety and depressed mood.  Plan: I will meet with the patient every 2 weeks via video session TX. Plan:To use cognitive behavioral therapy principles as well as elements of dialectical behavior therapy.  Goals are to reduce anxiety and depression by at least 50% with a target date of November 06, 2022.  Goals are to have less sadness as indicated by PH-9 scores as well as patient report.  We also work on improving mood and return to a healthier level of functioning as defined by her goals for being happy, identify causes and process triggers for depressed mood.  We will use cognitive behavioral therapy to explore and replace unhealthy thoughts and behavior patterns contributing to depression.  I will continue to encourage shearing of feelings related to the causes and symptoms of depression as well as teach and encouraged use of coping skills for management of depressive symptoms.  We also will work to improve the patient's ability to manage anxiety symptoms and better handle stress, identify causes for anxiety and explore ways for reduction of anxiety in addition to resolving conflicts contributing to anxiety and managing thoughts and worrisome thinking is contributing to anxiety.  Interventions will include providing education about anxiety, facilitate problem solution skills, teaching coping skills for managing anxiety such as grounding exercises, progressive muscle relaxation and cognitive re framing etc.  We  will also use cognitive behavioral therapy to identify and change anxiety provoking thoughts and behavior patterns as well as using DBT distress tolerance and mindfulness skills. I reviewed the treatment plan goals with the patient who agreed to continue with goals as stated above. Progress: 35%New target date will be October 31st,  2025. Lorrene CHRISTELLA Hasten, [facility]                                                Lorrene CHRISTELLA Hasten, [facility]               Lorrene CHRISTELLA Hasten, [facility]               Lorrene CHRISTELLA Hasten, [facility]               Lorrene CHRISTELLA Hasten, [facility]               Lorrene CHRISTELLA Hasten, [facility]               Lorrene CHRISTELLA Hasten, [facility]               Lorrene CHRISTELLA Hasten, [facility]               Lorrene CHRISTELLA Hasten, [facility]               Lorrene CHRISTELLA Hasten, [facility]               Lorrene CHRISTELLA Hasten, [facility]               Lorrene CHRISTELLA Hasten, [facility]               Lorrene CHRISTELLA Hasten, [facility]               Lorrene CHRISTELLA Hasten, [facility]               Lorrene CHRISTELLA Hasten, [facility]               Lorrene CHRISTELLA Hasten, [facility]

## 2023-09-18 NOTE — Patient Instructions (Signed)
 Visit Information  Thank you for taking time to visit with me today. Please don't hesitate to contact me if I can be of assistance to you before our next scheduled telephone appointment.  Our next appointment is by telephone on 09/22/23 at 10:30 am  Following is a copy of your care plan:   Goals Addressed             This Visit's Progress    VBCI Transitions of Care (TOC) Care Plan       Problems:  Recent Hospitalization for treatment of CHF Knowledge Deficit Related to management of heart failure/ atrial fibrillation/ HTN  Goal:  Over the next 30 days, the patient will not experience hospital readmission  Interventions:  AFIB Interventions: Assessed for atrial fibrillation symptoms.  Assessed for symptoms post 09/15/23 ablation Advised to notify cardiologist for any new ongoing symptoms. Assessed for heart cath incision site Reviewed signs of infection- advised to notify cardiologist if symptoms noted.  Advised to seek emergency medical services for severe symptoms Assessed pulse readings Advised to avoid caffeine Take medications as advised Heart Failure Interventions: Advised patient to weigh each morning after emptying bladder Discussed importance of daily weight and advised patient to weigh and record daily Discussed/ reviewed heart failure action plan. Confirmed patient understands her prescribed action plan by using teach back method. Assessed for heart failure symptoms.  Advised to notify provider for 2 lb weight gain overnight or 5 lbs in a week, increase swelling in feet, legs, abdomen, hands, increase SOB. Advised patient to contact her cardiology office and let them know she does not want to see a specific cardiologist due to any concerns she has.  Advised that this will be noted in her chart for future reference.  Confirmed patient taking her lasix  as prescribed Advised to notify provider for new/ ongoing symptoms Assessed weight Hypertension Interventions: Last  practice recorded BP readings:  BP Readings from Last 3 Encounters:  09/15/23 (!) 143/75  09/10/23 (!) 162/92  09/02/23 112/79   Most recent eGFR/CrCl:  Lab Results  Component Value Date   EGFR 72 08/19/2023    No components found for: CRCL  Evaluation of current treatment plan related to hypertension self management and patient's adherence to plan as established by provider Reviewed medications with patient and discussed importance of compliance Advised patient, providing education and rationale, to monitor blood pressure daily and record, calling PCP for findings outside established parameters Reviewed scheduled/upcoming provider appointments including:  Assessed recent blood pressure readings-  Advised to record blood pressures daily( patient started on new blood pressure medication 09/15/23, amlodipine )   Patient Self Care Activities:  Call pharmacy for medication refills 3-7 days in advance of running out of medications Call provider office for new concerns or questions  Take medications as prescribed   Continue to monitor blood pressure, pulse and weight daily and record. Take readings to provider visits.  Notify provider for increase weight gain of 3 lbs overnight or 5 lbs in a week, increase in swelling in feet, legs, abdomen, hands or increase in SOB.  Implement heart failure action plan as instructed by cardiologist. Monitor for signs of infection at groin site. Notify provider for infection like symptoms or any other symptoms   Plan:  Telephone follow up appointment with care management team member scheduled for:  09/22/23 at 10:30am        Patient verbalizes understanding of instructions and care plan provided today and agrees to view in MyChart. Active MyChart status and  patient understanding of how to access instructions and care plan via MyChart confirmed with patient.     The patient has been provided with contact information for the care management team and has  been advised to call with any health related questions or concerns.   Please call the care guide team at [phone] if you need to cancel or reschedule your appointment.   Please call the Suicide and Crisis Lifeline: 988 call the [place]  National Suicide Prevention Lifeline: 4185884418 or TTY: (260) 176-8547 TTY 8160500999) to talk to a trained counselor call 1-800-273-TALK (toll free, 24 hour hotline) go to [facility] [address], [place] [phone]) if you are experiencing a Mental Health or Behavioral Health Crisis or need someone to talk to.  Arvin Seip RN, BSN, CCM [employer], Population Health Case Manager Phone: [phone]
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
## 2023-09-21 ENCOUNTER — Encounter (HOSPITAL_COMMUNITY): Payer: Self-pay

## 2023-09-22 ENCOUNTER — Other Ambulatory Visit: Payer: Self-pay
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
## 2023-09-22 NOTE — Patient Instructions (Signed)
 Visit Information  Thank you for taking time to visit with me today. Please don't hesitate to contact me if I can be of assistance to you before our next scheduled telephone appointment.  Our next appointment is by telephone on 09/30/23 at 10:30am  Following is a copy of your care plan:   Goals Addressed             This Visit's Progress    VBCI Transitions of Care (TOC) Care Plan       Problems:  Recent Hospitalization for treatment of CHF/ atrial fibrillation Knowledge Deficit Related to management of heart failure/ atrial fibrillation/ HTN  Goal:  Over the next 30 days, the patient will not experience hospital readmission  Interventions:  AFIB Interventions: Assessed for atrial fibrillation symptoms.  Advised to call or send Mychart message to cardiologist reporting pain/ discomfort in right last post ablation. Advised to notify provider also taking OTC tylenol  to manage the pain.  Assessed for symptoms post 09/15/23 ablation Assessed  heart cath incision site Reviewed signs of infection- advised to notify cardiologist if symptoms noted.  Advised to seek emergency medical services for severe symptoms Assessed pulse readings Advised to avoid caffeine Take medications as advised Heart Failure Interventions: Advised patient to weigh each morning after emptying bladder Discussed/ reviewed heart failure action plan. Confirmed patient understands her prescribed action plan by using teach back method. Assessed for heart failure symptoms.  Advised to notify provider for 2 lb weight gain overnight or 5 lbs in a week, increase swelling in feet, legs, abdomen, hands, increase SOB. Confirmed patient taking her lasix  as prescribed Advised to notify provider for new/ ongoing symptoms Hypertension Interventions: Last practice recorded BP readings:  Assessed blood pressure readings Advised ongoing monitoring of blood pressure daily and to record.  BP Readings from Last 3 Encounters:   09/15/23 (!) 143/75  09/10/23 (!) 162/92  09/02/23 112/79   Most recent eGFR/CrCl:  Lab Results  Component Value Date   EGFR 72 08/19/2023    No components found for: CRCL  Evaluation of current treatment plan related to hypertension self management and patient's adherence to plan as established by provider Reviewed medications with patient and discussed importance of compliance Advised patient, providing education and rationale, to monitor blood pressure daily and record, calling PCP for findings outside established parameters Reviewed scheduled/upcoming provider appointments including:  Assessed recent blood pressure readings-  Advised to record blood pressures daily( patient started on new blood pressure medication 09/15/23, amlodipine )   Patient Self Care Activities:  Call pharmacy for medication refills 3-7 days in advance of running out of medications Call provider office for new concerns or questions  Take medications as prescribed   Continue to monitor blood pressure, pulse and weight daily and record. Take readings to provider visits.  Notify provider for increase weight gain of 3 lbs overnight or 5 lbs in a week, increase in swelling in feet, legs, abdomen, hands or increase in SOB.  Implement heart failure action plan as instructed by cardiologist. Monitor for signs of infection at groin site. Notify provider for infection like symptoms or any other symptoms Call and/ or send message to cardiologist regarding ongoing right leg pain post ablation.    Plan:  Telephone follow up appointment with care management team member scheduled for:  09/30/23 at 10:30am        Patient verbalizes understanding of instructions and care plan provided today and agrees to view in MyChart. Active MyChart status and patient understanding of  how to access instructions and care plan via MyChart confirmed with patient.     The patient has been provided with contact information for the care  management team and has been advised to call with any health related questions or concerns.   Please call the care guide team at [phone] if you need to cancel or reschedule your appointment.   Please call the Suicide and Crisis Lifeline: 988 call the [place]  National Suicide Prevention Lifeline: 407-883-1640 or TTY: 2010861521 TTY [phone]) to talk to a trained counselor call 1-800-273-TALK (toll free, 24 hour hotline) go to [facility] [address] [place] [phone]) if you are experiencing a Mental Health or Behavioral Health Crisis or need someone to talk to.  Arvin Seip RN, BSN, CCM [employer], Population Health Case Manager Phone: [phone]

## 2023-09-22 NOTE — Therapy (Signed)
 OUTPATIENT PHYSICAL THERAPY NEURO EVALUATION   Patient Name: Shannon [name] MRN: [id number] DOB:10-Oct-1950, 73 y.o., female Today's Date: 09/23/2023   PCP: Thersia Stark REFERRING PROVIDER: Toribio Furnace  END OF SESSION:  PT End of Session - 09/23/23 1009     Visit Number 1    Date for PT Re-Evaluation 12/02/23    Authorization Type [employer]    PT Start Time 1010    PT Stop Time 1055    PT Time Calculation (min) 45 min          Past Medical History:  Diagnosis Date   Acute systolic heart failure (HCC) 07/08/2023   Allergy    Anemia    Anxiety    Arthritis 1990's   neck and lower back (03/25/2016)   Asthma 1990s X 1   short term inhaler use    CAD (coronary artery disease)    Cardiac pacemaker in situ    MDT   CHF (congestive heart failure) (HCC)    Chronic lower back pain    COVID-19 03/06/2022   Degenerative disorder of bone    Depression    Diastolic dysfunction    Drug-induced lupus erythematosus    HCTZ induced; still gettin over it (03/25/2016)   Foot swelling 05/19/2022   GERD (gastroesophageal reflux disease) 1990's   Headache, unspecified headache type 03/06/2022   Herniated disc, cervical    Hospital discharge follow-up 03/14/2022   Hyperlipidemia    Hypertension    Neuromuscular disorder (HCC)    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 HTN   Obesity (BMI 30-39.9) 11/07/2022   Orthostatic hypotension    OSA on CPAP    Osteopenia    Osteoporosis 2012   PAF (paroxysmal atrial fibrillation) (HCC)    Paroxysmal atrial fibrillation (HCC) 12/13/2021   Phlebitis after infusion 03/18/2023   Pinched nerve in neck    PND (post-nasal drip) 12/18/2022   Rapid atrial fibrillation (HCC) 07/06/2023   Sinus node dysfunction (HCC)    Sinus pressure 12/18/2022   Sleep apnea 1990's   wears CPAP   Stroke (HCC)    T12 compression fracture (HCC) 11/2015   Vitamin D  deficiency    Whiplash injury 06/07/2010   Past Surgical History:   Procedure Laterality Date   APPENDECTOMY  1990s   ATRIAL FIBRILLATION ABLATION N/A 03/25/2016   Procedure: Atrial Fibrillation Ablation;  Surgeon: Lynwood Rakers, MD;  Location: [facility] INVASIVE CV LAB;  Service: Cardiovascular;  Laterality: N/A;   ATRIAL FIBRILLATION ABLATION N/A 01/31/2020   Procedure: ATRIAL FIBRILLATION ABLATION;  Surgeon: Rakers Lynwood, MD;  Location: [facility] INVASIVE CV LAB;  Service: Cardiovascular;  Laterality: N/A;   ATRIAL FIBRILLATION ABLATION N/A 12/13/2021   Procedure: ATRIAL FIBRILLATION ABLATION;  Surgeon: [name], MD;  Location: [facility] INVASIVE CV LAB;  Service: Cardiovascular;  Laterality: N/A;   AV NODE ABLATION N/A 09/15/2023   Procedure: AV NODE ABLATION;  Surgeon: [name], MD;  Location: [facility] INVASIVE CV LAB;  Service: Cardiovascular;  Laterality: N/A;   CARDIOVERSION N/A 07/09/2023   Procedure: CARDIOVERSION;  Surgeon: Okey Vina GAILS, MD;  Location: [facility] INVASIVE CV LAB;  Service: Cardiovascular;  Laterality: N/A;   COLONOSCOPY     FOREARM FRACTURE SURGERY Left ~ 02/2011   broke arm; shattered wrist   FOREARM HARDWARE REMOVAL Left ~ 07/2011   implantable loop recorder placement  03/07/2019   Medtronic Reveal Linq model LNQ 22 implantable loop recorder ([id number]) implanted by Dr Rakers for Afib management   INSERT /  REPLACE / REMOVE PACEMAKER  2025   LOOP RECORDER REMOVAL N/A 03/09/2023   Procedure: LOOP RECORDER REMOVAL;  Surgeon: Kennyth Chew, MD;  Location: [facility] INVASIVE CV LAB;  Service: Cardiovascular;  Laterality: N/A;   PACEMAKER IMPLANT N/A 03/09/2023   Procedure: PACEMAKER IMPLANT;  Surgeon: Kennyth Chew, MD;  Location: [facility] INVASIVE CV LAB;  Service: Cardiovascular;  Laterality: N/A;   Spinal Nerve Ablation     TEE WITHOUT CARDIOVERSION N/A 03/24/2016   Procedure: TRANSESOPHAGEAL ECHOCARDIOGRAM (TEE);  Surgeon: Jerel Balding, MD;  Location: [facility] ENDOSCOPY;  Service: Cardiovascular;  Laterality: N/A;   Patient Active Problem List    Diagnosis Date Noted   Hypertensive urgency 08/28/2023   Hypokalemia 08/28/2023   Acute on chronic systolic CHF (congestive heart failure) (HCC) 08/27/2023   Chronic obstructive pulmonary disease with acute exacerbation (HCC) 07/14/2023   Chronic HFrEF (heart failure with reduced ejection fraction) (HCC) 07/06/2023   Atrial fibrillation with RVR (HCC) 03/07/2023   History of tachycardia-bradycardia syndrome (HCC) 03/07/2023   Demand ischemia (HCC) 03/07/2023   Current severe episode of major depressive disorder without psychotic features without prior episode (HCC) 03/06/2022   GAD (generalized anxiety disorder) 03/06/2022   Caregiver role strain 03/06/2022   Adjustment disorder with mixed anxiety and depressed mood 03/03/2022   Diplopia 01/15/2022   History of CVA (cerebrovascular accident) 01/15/2022   Thoracic spine pain 11/22/2021   Tinnitus of both ears 11/22/2021   Bilateral lower extremity edema 11/22/2021   Acute CVA (cerebrovascular accident) (HCC) 02/06/2021   Dyslipidemia, goal LDL below 130 07/10/2020   Iron  deficiency anemia 07/10/2020   Shortness of breath 12/23/2019   Asthma 12/23/2019   Morbid obesity (HCC) 09/11/2017   Chronic fatigue 05/27/2016   A-fib (HCC) 03/25/2016   Typical atrial flutter (HCC)    Chronic diastolic CHF (congestive heart failure) (HCC) 12/08/2015   Atrial fibrillation with rapid ventricular response (HCC) 12/08/2015   Thoracic compression fracture (HCC) 11/16/2015   Orthostatic hypotension 11/15/2015   Osteopenia 09/12/2015   Vitamin D  deficiency disease 08/29/2015   Polymyalgia rheumatica (HCC) 04/18/2015   OSA (obstructive sleep apnea) 03/04/2015   Myalgia 11/22/2014   Primary hypertension 08/21/2014   Anxiety and depression 08/21/2014    ONSET DATE: 08/31/23  REFERRING DIAG:  Mobility status PT Visit Diagnosis: Unsteadiness on feet (R26.81);Muscle weakness (generalized) (M62.81)      THERAPY DIAG:  Gait instability  Muscle  weakness (generalized)  Other abnormalities of gait and mobility  Shortness of breath  Rationale for Evaluation and Treatment: Rehabilitation  SUBJECTIVE:  SUBJECTIVE STATEMENT: It has been a rough ride since June. I think I had Covid, but when I tested for it I didn't have it. I have chronic a-fib but with the sickness and coughing it diminished my heart and lung function. My heart pumping is down to <30%.   Pt accompanied by: self  PERTINENT HISTORY: long history of atrial fibrillation with multiple prior catheter ablations, history of heart failure, dyspnea History of CVA  PAIN:  Are you having pain? Yes: NPRS scale: at worst 7  Pain location: R groin and thigh and R hip  Pain description: lightning bolt pain, it started after they did the AV node ablation on 09/15/23 it has eased up some  Aggravating factors: pants rubbing on it Relieving factors: Tylenol   PRECAUTIONS: None  RED FLAGS: None   WEIGHT BEARING RESTRICTIONS: No  FALLS: Has patient fallen in last 6 months? Yes. Number of falls 1, was moving houses and tripped over a step   LIVING ENVIRONMENT: Lives with: lives with their daughter Lives in: House/apartment Stairs: Yes: Internal: full set but does not go up them steps; on right going up and External: 3 steps; can reach both Has following equipment at home: Vannie - 2 wheeled, Environmental consultant - 4 wheeled, and Wheelchair (manual)  PLOF: Independent, Independent with basic ADLs, Independent with community mobility without device, and Independent with gait  PATIENT GOALS: whatever you can do to make me better physically   OBJECTIVE:  Note: Objective measures were completed at Evaluation unless otherwise noted.  DIAGNOSTIC FINDINGS: N/A  COGNITION: Overall cognitive status: Within  functional limits for tasks assessed   SENSATION: Light touch: some numbness in toes and spreads to feet   COORDINATION: Decrease balance and coordination   POSTURE: rounded shoulders  LOWER EXTREMITY ROM:  WFL BLE, some pain with R hip flexion   LOWER EXTREMITY MMT:    MMT Right Eval Left Eval  Hip flexion 3+ 4  Hip extension    Hip abduction 4+ 5  Hip adduction    Hip internal rotation    Hip external rotation    Knee flexion 5 5  Knee extension 4 4+  Ankle dorsiflexion    Ankle plantarflexion    Ankle inversion    Ankle eversion    (Blank rows = not tested)   STAIRS: Findings: Level of Assistance: Modified independence and Stair Negotiation Technique: Step to Pattern with Bilateral Rails GAIT: Findings: Gait Characteristics: narrow BOS, poor foot clearance- Right, and poor foot clearance- Left, unstable especially with turns, unsteady on her feet   FUNCTIONAL TESTS:  5 times sit to stand: 16s Timed up and go (TUG): 11.30s 3 minute walk test: 683ft 97%, 80bpm BERG 45/56                                                                                                                               TREATMENT DATE: 09/23/23 EVAL    PATIENT EDUCATION: Education  details: POC, HEP Person educated: Patient Education method: Explanation and Demonstration Education comprehension: verbalized understanding and returned demonstration  HOME EXERCISE PROGRAM: Access Code: [email or web address] URL: [email or web address] Date: 09/23/2023 Prepared by: Almetta Fam  Exercises - Sit to Stand  - 1 x daily - 7 x weekly - 2 sets - 10 reps - Standing Single Leg Stance with Counter Support  - 1 x daily - 7 x weekly - 10 reps - 10 hold - Standing Tandem Balance with Counter Support  - 1 x daily - 7 x weekly - 10 reps - 15 hold  GOALS: Goals reviewed with patient? Yes  SHORT TERM GOALS: Target date: 10/28/23  Patient will be independent with initial HEP. Baseline:  Goal  status: INITIAL  2.  Patient will demonstrate improved functional LE strength as demonstrated by 5xSTS <13s. Baseline: 16s Goal status: INITIAL   LONG TERM GOALS: Target date: 12/02/23  Patient will be independent with advanced/ongoing HEP to improve outcomes and carryover.  Baseline:  Goal status: INITIAL  2.  Patient will be able to ambulate 800' in with less staggering and unsteadiness Baseline: 662ft some SOB and needs SBA Goal status: INITIAL  3.  Patient will score 53 on Berg Balance test to demonstrate lower risk of falls. (MCID= 8 points) .  Baseline: 45 Goal status: INITIAL  4.  Patient will able to return to her normal household chores/routine without SOB and fatigue.  Baseline:  Goal status: INITIAL  5.  Patient will reports decrease in pain in R hip and groin <2/10. Baseline: 7/10 at worst Goal status: INITIAL   ASSESSMENT:  CLINICAL IMPRESSION: Patient is a 74 y.o. female who was seen today for physical therapy evaluation and treatment for generalized weakness. She has an extensive medical history of heart issues, including a-fib and heart failure. She reports being sick in June and everything went downhill since then. Recent AV node ablation was on 09/15/23. Pt reports difficulty with her normal activities due to fatigue, shortness or breath, and weakness. She reports some staggering and wobbling when walking but she also thinks she has some vision problems that contribute to this. She does have a walker that she uses for longer distances. With functional tests completed today, she does present as a fall risk. Therefore, she will benefit from skilled PT to address weakness, endurance, gait, and balance impairments to decrease risk for fall and help her return to PLOF.  OBJECTIVE IMPAIRMENTS: Abnormal gait, cardiopulmonary status limiting activity, decreased activity tolerance, decreased balance, decreased coordination, decreased endurance, decreased strength,  increased edema, and pain.   ACTIVITY LIMITATIONS: carrying, squatting, stairs, and locomotion level  PARTICIPATION LIMITATIONS: meal prep, cleaning, laundry, shopping, community activity, and yard work  PERSONAL FACTORS: Age, Fitness, and 1-2 comorbidities: a-fib, heart failure are also affecting patient's functional outcome.   REHAB POTENTIAL: Good  CLINICAL DECISION MAKING: Stable/uncomplicated  EVALUATION COMPLEXITY: Low  PLAN:  PT FREQUENCY: 2x/week  PT DURATION: 10 weeks  PLANNED INTERVENTIONS: 97110-Therapeutic exercises, 97530- Therapeutic activity, 97112- Neuromuscular re-education, 97535- Self Care, 02859- Manual therapy, [phone]- Gait training, Patient/Family education, Balance training, Stair training, Cryotherapy, and Moist heat  PLAN FOR NEXT SESSION: gait and balance training, endurance training    Almetta Fam, PT 09/23/2023, 11:09 AM

## 2023-09-23 ENCOUNTER — Encounter (HOSPITAL_COMMUNITY): Payer: Self-pay

## 2023-09-23 ENCOUNTER — Ambulatory Visit: Attending: Internal Medicine

## 2023-09-23 ENCOUNTER — Encounter: Admitting: Cardiology

## 2023-09-23 DIAGNOSIS — R2689 Other abnormalities of gait and mobility: Secondary | ICD-10-CM | POA: Insufficient documentation

## 2023-09-23 DIAGNOSIS — M6281 Muscle weakness (generalized): Secondary | ICD-10-CM | POA: Insufficient documentation

## 2023-09-23 DIAGNOSIS — R2681 Unsteadiness on feet: Secondary | ICD-10-CM | POA: Insufficient documentation

## 2023-09-23 DIAGNOSIS — R0602 Shortness of breath: Secondary | ICD-10-CM | POA: Insufficient documentation

## 2023-09-24 ENCOUNTER — Encounter (HOSPITAL_BASED_OUTPATIENT_CLINIC_OR_DEPARTMENT_OTHER): Payer: Self-pay | Admitting: Family Medicine

## 2023-09-24 MED ORDER — POTASSIUM CHLORIDE CRYS ER 20 MEQ PO TBCR: 20.0000 meq | EXTENDED_RELEASE_TABLET | Freq: Every day | ORAL | 3 refills | Status: DC

## 2023-09-25 ENCOUNTER — Encounter: Payer: Self-pay | Admitting: Behavioral Health

## 2023-09-25 ENCOUNTER — Ambulatory Visit: Admitting: Behavioral Health

## 2023-09-25 DIAGNOSIS — F4323 Adjustment disorder with mixed anxiety and depressed mood: Secondary | ICD-10-CM

## 2023-09-25 NOTE — Progress Notes (Signed)
 [facility] Counselor/Therapist Progress Note  Patient ID: Shannon [name], MRN: [id number],    Date: September 25, 2023  Time Spent: 57 minutes, 3:00pm until 3:57 PM this session was held via video teletherapy. The patient consented to the video teletherapy and was located in her home during this session. She is aware it is the responsibility of the patient to secure confidentiality on her end of the session. The provider was in a private home office for the duration of this session.     Reported Symptoms: Anxiety, depression  Mental Status Exam: Appearance:  Well Groomed     Behavior: Appropriate  Motor: Normal  Speech/Language:  Clear and Coherent  Affect: Appropriate for  Mood: normal  Thought process: normal  Thought content:   WNL  Sensory/Perceptual disturbances:   WNL  Orientation: oriented to person, place, time/date, situation, day of week, month of year, and year  Attention: Good  Concentration: Good  Memory: WNL  Fund of knowledge:  Good  Insight:   Good  Judgment:  Good  Impulse Control: Good   Risk Assessment: Danger to Self:  No Self-injurious Behavior: No Danger to Others: No Duty to Warn:no Physical Aggression / Violence:No  Access to Firearms a concern: No  Gang Involvement:No   Subjective: It has been a better week for the patient.  She started cardiac rehab this week and said it was tough on her physically.  She did not realize how much strength she has lost over the past several months because of not being well.  The therapist said that she felt the patient was ahead of where she thought she might be best not what she had been through and has some things to do at home.  She is initially been approved for 20 visits and says she is going to work hard to get stronger so she can get some things done be more active with her daughter and grandson son.  She continues to strengthen her relationship with her brother and sister.  She says there  have been some difficult conversations in the past with her sister-in-law who did see some of what her husband had been doing but now was not in communication with the patient and she recognized that some boundaries that she needs to set up.  She feels that she is doing steady progress with moving past some bad feelings toward her husband and is working on forgiveness. She does contract for safety having no thoughts of hurting herself or anyone else. Interventions: Cognitive Behavioral Therapy  Diagnosis: Adjustment disorder with mixed anxiety and depressed mood.  Plan: I will meet with the patient every 2 weeks via video session TX. Plan:To use cognitive behavioral therapy principles as well as elements of dialectical behavior therapy.  Goals are to reduce anxiety and depression by at least 50% with a target date of November 06, 2022.  Goals are to have less sadness as indicated by PH-9 scores as well as patient report.  We also work on improving mood and return to a healthier level of functioning as defined by her goals for being happy, identify causes and process triggers for depressed mood.  We will use cognitive behavioral therapy to explore and replace unhealthy thoughts and behavior patterns contributing to depression.  I will continue to encourage shearing of feelings related to the causes and symptoms of depression as well as teach and encouraged use of coping skills for management of depressive symptoms.  We also will work to improve  the patient's ability to manage anxiety symptoms and better handle stress, identify causes for anxiety and explore ways for reduction of anxiety in addition to resolving conflicts contributing to anxiety and managing thoughts and worrisome thinking is contributing to anxiety.  Interventions will include providing education about anxiety, facilitate problem solution skills, teaching coping skills for managing anxiety such as grounding exercises, progressive muscle  relaxation and cognitive re framing etc.  We will also use cognitive behavioral therapy to identify and change anxiety provoking thoughts and behavior patterns as well as using DBT distress tolerance and mindfulness skills. I reviewed the treatment plan goals with the patient who agreed to continue with goals as stated above. Progress: 35%New target date will be October 31st, 2025. Lorrene CHRISTELLA Hasten, [facility]                                                Lorrene CHRISTELLA Hasten, [facility]               Lorrene CHRISTELLA Hasten, [facility]               Lorrene CHRISTELLA Hasten, [facility]               Lorrene CHRISTELLA Hasten, [facility]               Lorrene CHRISTELLA Hasten, [facility]               Lorrene CHRISTELLA Hasten, [facility]               Lorrene CHRISTELLA Hasten, [facility]               Lorrene CHRISTELLA Hasten, [facility]               Lorrene CHRISTELLA Hasten, [facility]               Lorrene CHRISTELLA Hasten, [facility]               Lorrene CHRISTELLA Hasten, [facility]               Lorrene CHRISTELLA Hasten, [facility]               Lorrene CHRISTELLA Hasten, [facility]               Lorrene CHRISTELLA Hasten, [facility]               Lorrene CHRISTELLA Hasten, [facility]               Lorrene CHRISTELLA Hasten, [facility]

## 2023-09-28 ENCOUNTER — Encounter: Payer: Self-pay | Admitting: Cardiology

## 2023-09-30 ENCOUNTER — Other Ambulatory Visit: Payer: Self-pay

## 2023-09-30 VITALS — BP 135/85 | HR 82 | Wt 182.0 lb

## 2023-09-30 DIAGNOSIS — I4891 Unspecified atrial fibrillation: Secondary | ICD-10-CM

## 2023-09-30 DIAGNOSIS — I10 Essential (primary) hypertension: Secondary | ICD-10-CM

## 2023-09-30 DIAGNOSIS — I5032 Chronic diastolic (congestive) heart failure: Secondary | ICD-10-CM

## 2023-09-30 NOTE — Patient Instructions (Signed)
 Visit Information  Thank you for taking time to visit with me today. You have completed your 30 day TOC program and have met your goal.  You will be referred to the longitudinal RN case manager as discussed and agreed upon.   Please contact your provider for any further needs or concerns.     Following is a copy of your care plan:   Goals Addressed             This Visit's Progress    COMPLETED: [facility] Transitions of Care (TOC) Care Plan       Goals met.  Patient verbally agreed to be referred to the longitudinal RN case manager for ongoing follow up  Problems:  Recent Hospitalization for treatment of CHF/ atrial fibrillation Knowledge Deficit Related to management of heart failure/ atrial fibrillation/ HTN  Goal:  Over the next 30 days, the patient will not experience hospital readmission  Interventions:  AFIB Interventions: Assessed for atrial fibrillation symptoms.  Advised to call or send Mychart message to cardiologist reporting ongoing pain/ discomfort in right leg Assessed  heart cath incision site Reviewed signs of infection- advised to notify cardiologist if symptoms noted.  Advised to seek emergency medical services for severe symptoms Assessed pulse readings Advised to avoid caffeine Take medications as advised Heart Failure Interventions: Advised patient to weigh each morning after emptying bladder Assessed weight-  Advised patient to take pulse, weight and blood pressure reading log to her appointment with cardiologist/ primary care provider.  Discussed/ reviewed heart failure action plan. Confirmed patient understands her prescribed action plan by using teach back method. Assessed for heart failure symptoms.  Advised to notify provider for 2 lb weight gain overnight or 5 lbs in a week, increase swelling in feet, legs, abdomen, hands, increase SOB. Confirmed patient taking her lasix  / potassium as prescribed.  Advised patient to contact cardiologist regarding whether  she should take an extra potassium when she takes extra lasix  according to her HF action plan Advised to notify provider for new/ ongoing symptoms Hypertension Interventions: Last practice recorded BP readings:  Assessed blood pressure readings Advised ongoing monitoring of blood pressure daily and to record.  BP Readings from Last 3 Encounters:  09/15/23 (!) 143/75  09/10/23 (!) 162/92  09/02/23 112/79  Confirmed patient taking her blood pressure medications as prescribed.  Most recent eGFR/CrCl:  Lab Results  Component Value Date   EGFR 72 08/19/2023    No components found for: CRCL  Reviewed medications with patient and discussed importance of compliance Reviewed scheduled/upcoming provider appointments including:  Discussed and offered ongoing follow up with longitudinal RN case manager- patient verbally agreed.    Patient Self Care Activities:  Call pharmacy for medication refills 3-7 days in advance of running out of medications Call provider office for new concerns or questions  Take medications as prescribed   Continue to monitor blood pressure, pulse and weight daily and record. Take readings to provider visits.  Notify provider for increase weight gain of 2-3 lbs overnight or 5 lbs in a week, increase in swelling in feet, legs, abdomen, hands or increase in SOB.  Implement heart failure action plan as instructed by cardiologist. Monitor for signs of infection at groin site. Notify provider for infection like symptoms or any other symptoms Call and/ or send message to cardiologist/ primary care provider regarding ongoing right leg pain post ablation.    Plan:  No further follow up required: from the [facility] RN case manager. Patient referred to  longitudinal RN case Production designer, theatre/television/film.         Patient verbalizes understanding of instructions and care plan provided today and agrees to view in MyChart. Active MyChart status and patient understanding of how to access instructions and  care plan via MyChart confirmed with patient.     The patient has been provided with contact information for the care management team and has been advised to call with any health related questions or concerns.   Please call the care guide team at [phone] if you need to cancel or reschedule your appointment.   Please call the Suicide and Crisis Lifeline: 988 call the [place]  National Suicide Prevention Lifeline: 769-158-2136 or TTY: 704-856-8590 TTY 787-196-5331) to talk to a trained counselor call 1-800-273-TALK (toll free, 24 hour hotline) if you are experiencing a Mental Health or Behavioral Health Crisis or need someone to talk to.  Arvin Seip RN, BSN, CCM [employer], Population Health Case Manager Phone: [phone]

## 2023-09-30 NOTE — Transitions of Care (Post Inpatient/ED Visit) (Addendum)
 Transition of Care week #5  Visit Note  09/30/2023  Name: Shannon [name] MRN: [id number]          DOB: February 23, 1950  Situation: Patient enrolled in [facility] 30-day program. Visit completed with patient by telephone.   Background:  hospitalized from 08/27/23 to 08/31/23 for Acute on chronic systolic CHF (congestive heart failure)     Past Medical History:  Diagnosis Date   Acute systolic heart failure (HCC) 07/08/2023   Allergy    Anemia    Anxiety    Arthritis 1990's   neck and lower back (03/25/2016)   Asthma 1990s X 1   short term inhaler use    CAD (coronary artery disease)    Cardiac pacemaker in situ    MDT   CHF (congestive heart failure) (HCC)    Chronic lower back pain    COVID-19 03/06/2022   Degenerative disorder of bone    Depression    Diastolic dysfunction    Drug-induced lupus erythematosus    HCTZ induced; still gettin over it (03/25/2016)   Foot swelling 05/19/2022   GERD (gastroesophageal reflux disease) 1990's   Headache, unspecified headache type 03/06/2022   Herniated disc, cervical    Hospital discharge follow-up 03/14/2022   Hyperlipidemia    Hypertension    Neuromuscular disorder (HCC)    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 HTN   Obesity (BMI 30-39.9) 11/07/2022   Orthostatic hypotension    OSA on CPAP    Osteopenia    Osteoporosis 2012   PAF (paroxysmal atrial fibrillation) (HCC)    Paroxysmal atrial fibrillation (HCC) 12/13/2021   Phlebitis after infusion 03/18/2023   Pinched nerve in neck    PND (post-nasal drip) 12/18/2022   Rapid atrial fibrillation (HCC) 07/06/2023   Sinus node dysfunction (HCC)    Sinus pressure 12/18/2022   Sleep apnea 1990's   wears CPAP   Stroke (HCC)    T12 compression fracture (HCC) 11/2015   Vitamin D  deficiency    Whiplash injury 06/07/2010    Assessment: Patient Reported Symptoms: Cognitive Cognitive Status: No symptoms reported, Alert and oriented to person, place, and time,  Insightful and able to interpret abstract concepts, Normal speech and language skills      Neurological Neurological Review of Symptoms: No symptoms reported    HEENT HEENT Symptoms Reported: No symptoms reported      Cardiovascular Cardiovascular Symptoms Reported: Other: Other Cardiovascular Symptoms: patient states she continues to have right leg pain. she reports pain is worse upon waking up in the morning before standing up.  Patient states the pain/ discomfort is constant throughout the day with a pain level today of 3-4.  Patient states she has communicated back and forth with her cardiology office reporting these symptoms.  She denies having any change in her treatment plan.  Patient states she is scheduled to follow up with the cardiologist on 10/15/23. Does patient have uncontrolled Hypertension?: No Is patient checking Blood Pressure at home?: Yes Patient's Recent BP reading at home: patient reports blood pressure reading: 133/98, 135/85, 144/76 139/85 Cardiovascular Management Strategies: Routine screening, Medication therapy, Diet modification, Medical device (pacemaker) Weight: 182 lb (82.6 kg)  Respiratory Respiratory Symptoms Reported: Shortness of breath Additional Respiratory Details: Patient reports having mild shortness of breath on yesterday while outside. She states she doesn't go outside often and this may have been due to allergies. States she was also outside with her grandkids. Respiratory Management Strategies: Medication therapy, Routine screening  Endocrine Endocrine  Symptoms Reported: No symptoms reported    Gastrointestinal Gastrointestinal Symptoms Reported: No symptoms reported      Genitourinary Genitourinary Symptoms Reported: No symptoms reported    Integumentary Integumentary Symptoms Reported: No symptoms reported    Musculoskeletal Musculoskelatal Symptoms Reviewed: Weakness Additional Musculoskeletal Details: patient states she continues with  rehab. Musculoskeletal Management Strategies: Routine screening, Medication therapy, Medical device      Psychosocial Psychosocial Symptoms Reported: Depression - if selected complete PHQ 2-9 Additional Psychological Details: patient denies any increase in symptoms.         Vitals:   09/30/23 1101  BP: 135/85  Pulse: 82    Medications Reviewed Today     Reviewed by Braelyn Bordonaro E, RN (Registered Nurse) on 09/30/23 at 1048  Med List Status: <None>   Medication Order Taking? Sig Documenting Provider Last Dose Status Informant  acetaminophen  (TYLENOL ) 500 MG tablet [id number] Yes Take 1,000 mg by mouth every 6 (six) hours as needed for moderate pain or headache. [provider]  Active Self, Pharmacy Records  amLODipine  (NORVASC ) 5 MG tablet [id number] Yes Take 1 tablet (5 mg total) by mouth daily. Sabharwal, Aditya, DO  Active   busPIRone  (BUSPAR ) 5 MG tablet [id number] Yes Take 1 tablet (5 mg total) by mouth 2 (two) times daily. [name], NP  Active Self, Pharmacy Records  Cholecalciferol  (VITAMIN D ) 50 MCG (2000 UT) CAPS [id number] Yes Take 2,000 Units by mouth in the morning. [provider]  Active Self, Pharmacy Records  ELIQUIS  5 MG TABS tablet [id number] Yes TAKE 1 TABLET BY MOUTH TWICE  DAILY [name], MD  Active Self, Pharmacy Records  empagliflozin  (JARDIANCE ) 10 MG TABS tablet [id number] Yes Take 1 tablet (10 mg total) by mouth daily before breakfast. Darryle Thom CROME, PA-C  Active Self, Pharmacy Records  furosemide  (LASIX ) 20 MG tablet [id number] Yes Take 2 tablets (40 mg total) by mouth daily. In case of weight gain 2 to 3 lbs in 24 hrs or 4 lbs in 7 days take 40 mg twice daily until weight back to baseline Arrien, Elidia Sieving, MD  Active   Hydrocortisone [facility]) [id number] Yes Apply 1 application  topically as needed (skin irritation/itching). [provider]  Active Self, Pharmacy Records  levalbuterol  (XOPENEX  HFA) 45 MCG/ACT  inhaler [id number] Yes Inhale 2 puffs into the lungs every 4 (four) hours as needed for wheezing. [name], NP  Active Self, Pharmacy Records  metoprolol  succinate (TOPROL -XL) 100 MG 24 hr tablet [id number] Yes Take 1 tablet (100 mg total) by mouth daily. Take with or immediately following a meal. Arrien, Elidia Sieving, MD  Active   Multiple Vitamin (MULTIVITAMIN) tablet [id number] Yes Take 1 tablet by mouth at bedtime. [provider]  Active Self, Pharmacy Records  olmesartan  (BENICAR ) 20 MG tablet [id number] Yes Take 2 tablets (40 mg total) by mouth daily. Arrien, Mauricio Daniel, MD  Active   potassium chloride  SA (KLOR-CON  M) 20 MEQ tablet [id number] Yes Take 1 tablet (20 mEq total) by mouth daily. Take with furosemide . Knute [name], FNP  Active   simvastatin  (ZOCOR ) 20 MG tablet [id number] Yes TAKE 1 TABLET BY MOUTH DAILY Cable, Lynwood HERO, NP  Active Self, Pharmacy Records            Goals Addressed             This Visit's Progress    COMPLETED: VBCI Transitions of Care (TOC) Care Plan       Goals met.  Patient verbally agreed to be referred to the longitudinal RN case manager for ongoing follow up  Problems:  Recent Hospitalization for treatment of CHF/ atrial fibrillation Knowledge Deficit Related to management of heart failure/ atrial fibrillation/ HTN  Goal:  Over the next 30 days, the patient will not experience hospital readmission  Interventions:  AFIB Interventions: Assessed for atrial fibrillation symptoms.  Advised to call or send Mychart message to cardiologist reporting ongoing pain/ discomfort in right leg Assessed  heart cath incision site Reviewed signs of infection- advised to notify cardiologist if symptoms noted.  Advised to seek emergency medical services for severe symptoms Assessed pulse readings Advised to avoid caffeine Take medications as advised Heart Failure Interventions: Advised patient to weigh each morning after emptying  bladder Assessed weight-  Advised patient to take pulse, weight and blood pressure reading log to her appointment with cardiologist/ primary care provider.  Discussed/ reviewed heart failure action plan. Confirmed patient understands her prescribed action plan by using teach back method. Assessed for heart failure symptoms.  Advised to notify provider for 2 lb weight gain overnight or 5 lbs in a week, increase swelling in feet, legs, abdomen, hands, increase SOB. Confirmed patient taking her lasix  / potassium as prescribed.  Advised patient to contact cardiologist regarding whether she should take an extra potassium when she takes extra lasix  according to her HF action plan Advised to notify provider for new/ ongoing symptoms Hypertension Interventions: Last practice recorded BP readings:  Assessed blood pressure readings Advised ongoing monitoring of blood pressure daily and to record.  BP Readings from Last 3 Encounters:  09/15/23 (!) 143/75  09/10/23 (!) 162/92  09/02/23 112/79  Confirmed patient taking her blood pressure medications as prescribed.  Most recent eGFR/CrCl:  Lab Results  Component Value Date   EGFR 72 08/19/2023    No components found for: CRCL  Reviewed medications with patient and discussed importance of compliance Reviewed scheduled/upcoming provider appointments including:  Message sent to cardiologist regarding patient's reported ongoing right leg pain.  Discussed and offered ongoing follow up with longitudinal RN case manager- patient verbally agreed.    Patient Self Care Activities:  Call pharmacy for medication refills 3-7 days in advance of running out of medications Call provider office for new concerns or questions  Take medications as prescribed   Continue to monitor blood pressure, pulse and weight daily and record. Take readings to provider visits.  Notify provider for increase weight gain of 2-3 lbs overnight or 5 lbs in a week, increase in swelling  in feet, legs, abdomen, hands or increase in SOB.  Implement heart failure action plan as instructed by cardiologist. Monitor for signs of infection at groin site. Notify provider for infection like symptoms or any other symptoms Call and/ or send message to cardiologist/ primary care provider regarding ongoing right leg pain post ablation.    Plan:  No further follow up required: from the [facility] RN case manager. Patient referred to longitudinal RN case manager.          Recommendation:   Continue Current Plan of Care Referral to longitudinal RN case manager  Follow Up Plan:   Closing From:  Transitions of Care Program. Goals met  Arvin Seip RN, BSN, CCM [place]  [facility], Population Health Case Manager Phone: [phone]

## 2023-10-01 ENCOUNTER — Ambulatory Visit: Admitting: Physical Therapy

## 2023-10-01 DIAGNOSIS — M6281 Muscle weakness (generalized): Secondary | ICD-10-CM

## 2023-10-01 DIAGNOSIS — R2681 Unsteadiness on feet: Secondary | ICD-10-CM

## 2023-10-01 DIAGNOSIS — R0602 Shortness of breath: Secondary | ICD-10-CM | POA: Diagnosis not present

## 2023-10-01 DIAGNOSIS — R2689 Other abnormalities of gait and mobility: Secondary | ICD-10-CM | POA: Diagnosis not present

## 2023-10-01 NOTE — Therapy (Signed)
 OUTPATIENT PHYSICAL THERAPY NEURO   Patient Name: Shannon [name] MRN: [id number] DOB:[birth year], 73 y.o., female Today's Date: 10/01/2023   PCP: Thersia Stark REFERRING PROVIDER: Toribio Furnace  END OF SESSION:  PT End of Session - 10/01/23 1438     Visit Number 2    Date for Recertification  12/02/23    Authorization Type [employer]    PT Start Time 1438    PT Stop Time 1520    PT Time Calculation (min) 42 min          Past Medical History:  Diagnosis Date   Acute systolic heart failure (HCC) 07/08/2023   Allergy    Anemia    Anxiety    Arthritis 1990's   neck and lower back (03/25/2016)   Asthma 1990s X 1   short term inhaler use    CAD (coronary artery disease)    Cardiac pacemaker in situ    MDT   CHF (congestive heart failure) (HCC)    Chronic lower back pain    COVID-19 03/06/2022   Degenerative disorder of bone    Depression    Diastolic dysfunction    Drug-induced lupus erythematosus    HCTZ induced; still gettin over it (03/25/2016)   Foot swelling 05/19/2022   GERD (gastroesophageal reflux disease) 1990's   Headache, unspecified headache type 03/06/2022   Herniated disc, cervical    Hospital discharge follow-up 03/14/2022   Hyperlipidemia    Hypertension    Neuromuscular disorder (HCC)    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 HTN   Obesity (BMI 30-39.9) 11/07/2022   Orthostatic hypotension    OSA on CPAP    Osteopenia    Osteoporosis 2012   PAF (paroxysmal atrial fibrillation) (HCC)    Paroxysmal atrial fibrillation (HCC) 12/13/2021   Phlebitis after infusion 03/18/2023   Pinched nerve in neck    PND (post-nasal drip) 12/18/2022   Rapid atrial fibrillation (HCC) 07/06/2023   Sinus node dysfunction (HCC)    Sinus pressure 12/18/2022   Sleep apnea 1990's   wears CPAP   Stroke (HCC)    T12 compression fracture (HCC) 11/2015   Vitamin D  deficiency    Whiplash injury 06/07/2010   Past Surgical History:  Procedure  Laterality Date   APPENDECTOMY  1990s   ATRIAL FIBRILLATION ABLATION N/A 03/25/2016   Procedure: Atrial Fibrillation Ablation;  Surgeon: Lynwood Rakers, MD;  Location: [facility] INVASIVE CV LAB;  Service: Cardiovascular;  Laterality: N/A;   ATRIAL FIBRILLATION ABLATION N/A 01/31/2020   Procedure: ATRIAL FIBRILLATION ABLATION;  Surgeon: Rakers Lynwood, MD;  Location: [facility] INVASIVE CV LAB;  Service: Cardiovascular;  Laterality: N/A;   ATRIAL FIBRILLATION ABLATION N/A 12/13/2021   Procedure: ATRIAL FIBRILLATION ABLATION;  Surgeon: [name], MD;  Location: [facility] INVASIVE CV LAB;  Service: Cardiovascular;  Laterality: N/A;   AV NODE ABLATION N/A 09/15/2023   Procedure: AV NODE ABLATION;  Surgeon: [name], MD;  Location: [facility] INVASIVE CV LAB;  Service: Cardiovascular;  Laterality: N/A;   CARDIOVERSION N/A 07/09/2023   Procedure: CARDIOVERSION;  Surgeon: Okey Vina GAILS, MD;  Location[facility] INVASIVE CV LAB;  Service: Cardiovascular;  Laterality: N/A;   COLONOSCOPY     FOREARM FRACTURE SURGERY Left ~ 02/2011   broke arm; shattered wrist   FOREARM HARDWARE REMOVAL Left ~ 07/2011   implantable loop recorder placement  03/07/2019   Medtronic Reveal Linq model LNQ 22 implantable loop recorder ([id number]) implanted by Dr Rakers for Afib management   INSERT /  REPLACE / REMOVE PACEMAKER  2025   LOOP RECORDER REMOVAL N/A 03/09/2023   Procedure: LOOP RECORDER REMOVAL;  Surgeon: Kennyth Chew, MD;  Location: [facility] INVASIVE CV LAB;  Service: Cardiovascular;  Laterality: N/A;   PACEMAKER IMPLANT N/A 03/09/2023   Procedure: PACEMAKER IMPLANT;  Surgeon: Kennyth Chew, MD;  Location: [facility] INVASIVE CV LAB;  Service: Cardiovascular;  Laterality: N/A;   Spinal Nerve Ablation     TEE WITHOUT CARDIOVERSION N/A 03/24/2016   Procedure: TRANSESOPHAGEAL ECHOCARDIOGRAM (TEE);  Surgeon: Jerel Balding, MD;  Location: [facility] ENDOSCOPY;  Service: Cardiovascular;  Laterality: N/A;   Patient Active Problem List   Diagnosis Date Noted    Hypertensive urgency 08/28/2023   Hypokalemia 08/28/2023   Acute on chronic systolic CHF (congestive heart failure) (HCC) 08/27/2023   Chronic obstructive pulmonary disease with acute exacerbation (HCC) 07/14/2023   Chronic HFrEF (heart failure with reduced ejection fraction) (HCC) 07/06/2023   Atrial fibrillation with RVR (HCC) 03/07/2023   History of tachycardia-bradycardia syndrome (HCC) 03/07/2023   Demand ischemia (HCC) 03/07/2023   Current severe episode of major depressive disorder without psychotic features without prior episode (HCC) 03/06/2022   GAD (generalized anxiety disorder) 03/06/2022   Caregiver role strain 03/06/2022   Adjustment disorder with mixed anxiety and depressed mood 03/03/2022   Diplopia 01/15/2022   History of CVA (cerebrovascular accident) 01/15/2022   Thoracic spine pain 11/22/2021   Tinnitus of both ears 11/22/2021   Bilateral lower extremity edema 11/22/2021   Acute CVA (cerebrovascular accident) (HCC) 02/06/2021   Dyslipidemia, goal LDL below 130 07/10/2020   Iron  deficiency anemia 07/10/2020   Shortness of breath 12/23/2019   Asthma 12/23/2019   Morbid obesity (HCC) 09/11/2017   Chronic fatigue 05/27/2016   A-fib (HCC) 03/25/2016   Typical atrial flutter (HCC)    Chronic diastolic CHF (congestive heart failure) (HCC) 12/08/2015   Atrial fibrillation with rapid ventricular response (HCC) 12/08/2015   Thoracic compression fracture (HCC) 11/16/2015   Orthostatic hypotension 11/15/2015   Osteopenia 09/12/2015   Vitamin D  deficiency disease 08/29/2015   Polymyalgia rheumatica 04/18/2015   OSA (obstructive sleep apnea) 03/04/2015   Myalgia 11/22/2014   Primary hypertension 08/21/2014   Anxiety and depression 08/21/2014    ONSET DATE: 08/31/23  REFERRING DIAG:  Mobility status PT Visit Diagnosis: Unsteadiness on feet (R26.81);Muscle weakness (generalized) (M62.81)      THERAPY DIAG:  Gait instability  Muscle weakness  (generalized)  Shortness of breath  Rationale for Evaluation and Treatment: Rehabilitation  SUBJECTIVE:  SUBJECTIVE STATEMENT:  motivated to get better. Doing HEP -challenging Pt accompanied by: self  PERTINENT HISTORY: long history of atrial fibrillation with multiple prior catheter ablations, history of heart failure, dyspnea History of CVA  PAIN:  Are you having pain? Yes: NPRS scale: at worst 7  Pain location: R groin and thigh and R hip  Pain description: lightning bolt pain, it started after they did the AV node ablation on 09/15/23 it has eased up some  Aggravating factors: pants rubbing on it Relieving factors: Tylenol   PRECAUTIONS: None  RED FLAGS: None   WEIGHT BEARING RESTRICTIONS: No  FALLS: Has patient fallen in last 6 months? Yes. Number of falls 1, was moving houses and tripped over a step   LIVING ENVIRONMENT: Lives with: lives with their daughter Lives in: House/apartment Stairs: Yes: Internal: full set but does not go up them steps; on right going up and External: 3 steps; can reach both Has following equipment at home: Vannie - 2 wheeled, Environmental consultant - 4 wheeled, and Wheelchair (manual)  PLOF: Independent, Independent with basic ADLs, Independent with community mobility without device, and Independent with gait  PATIENT GOALS: whatever you can do to make me better physically   OBJECTIVE:  Note: Objective measures were completed at Evaluation unless otherwise noted.  DIAGNOSTIC FINDINGS: N/A  COGNITION: Overall cognitive status: Within functional limits for tasks assessed   SENSATION: Light touch: some numbness in toes and spreads to feet   COORDINATION: Decrease balance and coordination   POSTURE: rounded shoulders  LOWER EXTREMITY ROM:  WFL BLE, some pain with R  hip flexion   LOWER EXTREMITY MMT:    MMT Right Eval Left Eval  Hip flexion 3+ 4  Hip extension    Hip abduction 4+ 5  Hip adduction    Hip internal rotation    Hip external rotation    Knee flexion 5 5  Knee extension 4 4+  Ankle dorsiflexion    Ankle plantarflexion    Ankle inversion    Ankle eversion    (Blank rows = not tested)   STAIRS: Findings: Level of Assistance: Modified independence and Stair Negotiation Technique: Step to Pattern with Bilateral Rails GAIT: Findings: Gait Characteristics: narrow BOS, poor foot clearance- Right, and poor foot clearance- Left, unstable especially with turns, unsteady on her feet   FUNCTIONAL TESTS:  5 times sit to stand: 16s Timed up and go (TUG): 11.30s 3 minute walk test: 658ft 97%, 80bpm BERG 45/56                                                                                                                               TREATMENT DATE:   10/01/23  Nustep L 2 5 min  O2 after 96 Red tband 2 sets 10 HS curl,LAQ,clams,hip abd and hip flexion STS with 3# chest press 2 sets 5, O2 after 95 Red tband 10 x SL hip flex,ext and abd Feet on ball bridge,KTC and  obl  10 x each Iso abdominals with ball 10 x hold 3 sec Resisted gait 3 x 4 ways    09/23/23 EVAL    PATIENT EDUCATION: Education details: POC, HEP Person educated: Patient Education method: Medical illustrator Education comprehension: verbalized understanding and returned demonstration  HOME EXERCISE PROGRAM: Access Code: [email or web address] URL: [email or web address] Date: 10/01/2023 Prepared by: Debraann Livingstone  Exercises - Standing Hip Flexion with Resistance Loop  - 1 x daily - 7 x weekly - 1 sets - 10 reps - Hip Extension with Resistance Loop  - 1 x daily - 7 x weekly - 1 sets - 10 reps - Hip Abduction with Resistance Loop  - 1 x daily - 7 x weekly - 1 sets - 10 reps - Seated Long Arc Quad  - 1 x daily - 7 x weekly - 1 sets - 10 reps -  Seated March  - 1 x daily - 7 x weekly - 1 sets - 10 reps - Seated Hip Abduction with Resistance  - 1 x daily - 7 x weekly - 10 reps  Access Code: [email or web address] URL: [email or web address] Date: 09/23/2023 Prepared by: Almetta Fam  Exercises - Sit to Stand  - 1 x daily - 7 x weekly - 2 sets - 10 reps - Standing Single Leg Stance with Counter Support  - 1 x daily - 7 x weekly - 10 reps - 10 hold - Standing Tandem Balance with Counter Support  - 1 x daily - 7 x weekly - 10 reps - 15 hold  GOALS: Goals reviewed with patient? Yes  SHORT TERM GOALS: Target date: 10/28/23  Patient will be independent with initial HEP. Baseline:  Goal status: 10/01/23 MET  2.  Patient will demonstrate improved functional LE strength as demonstrated by 5xSTS <13s. Baseline: 16s Goal status: INITIAL   LONG TERM GOALS: Target date: 12/02/23  Patient will be independent with advanced/ongoing HEP to improve outcomes and carryover.  Baseline:  Goal status: INITIAL  2.  Patient will be able to ambulate 800' in with less staggering and unsteadiness Baseline: 661ft some SOB and needs SBA Goal status: INITIAL  3.  Patient will score 53 on Berg Balance test to demonstrate lower risk of falls. (MCID= 8 points) .  Baseline: 45 Goal status: INITIAL  4.  Patient will able to return to her normal household chores/routine without SOB and fatigue.  Baseline:  Goal status: INITIAL  5.  Patient will reports decrease in pain in R hip and groin <2/10. Baseline: 7/10 at worst Goal status: INITIAL   ASSESSMENT:  CLINICAL IMPRESSION: pt arrives for first visit after eval. We reviewed HEP and added a few more ex for HEP. Educ pt on slow and steady progress and if ex feel too much to decrease reps. Progressed ex in clinic and gave rest as needed.STG #1 met    Patient is a 73 y.o. female who was seen today for physical therapy evaluation and treatment for generalized weakness. She has an extensive  medical history of heart issues, including a-fib and heart failure. She reports being sick in June and everything went downhill since then. Recent AV node ablation was on 09/15/23. Pt reports difficulty with her normal activities due to fatigue, shortness or breath, and weakness. She reports some staggering and wobbling when walking but she also thinks she has some vision problems that contribute to this. She does have a walker that she uses for longer distances. With functional tests completed today, she does  present as a fall risk. Therefore, she will benefit from skilled PT to address weakness, endurance, gait, and balance impairments to decrease risk for fall and help her return to PLOF.  OBJECTIVE IMPAIRMENTS: Abnormal gait, cardiopulmonary status limiting activity, decreased activity tolerance, decreased balance, decreased coordination, decreased endurance, decreased strength, increased edema, and pain.   ACTIVITY LIMITATIONS: carrying, squatting, stairs, and locomotion level  PARTICIPATION LIMITATIONS: meal prep, cleaning, laundry, shopping, community activity, and yard work  PERSONAL FACTORS: Age, Fitness, and 1-2 comorbidities: a-fib, heart failure are also affecting patient's functional outcome.   REHAB POTENTIAL: Good  CLINICAL DECISION MAKING: Stable/uncomplicated  EVALUATION COMPLEXITY: Low  PLAN:  PT FREQUENCY: 2x/week  PT DURATION: 10 weeks  PLANNED INTERVENTIONS: 97110-Therapeutic exercises, 97530- Therapeutic activity, [id number]- Neuromuscular re-education, 97535- Self Care, [id number]- Manual therapy, [phone]- Gait training, Patient/Family education, Balance training, Stair training, Cryotherapy, and Moist heat  PLAN FOR NEXT SESSION: assess and progress.look in next couple visits about educ on bike at home with being mindful of HR   Kahlia Lagunes,ANGIE, PTA 10/01/2023, 3:13 PM

## 2023-10-02 ENCOUNTER — Ambulatory Visit: Admitting: Behavioral Health

## 2023-10-02 ENCOUNTER — Encounter: Payer: Self-pay | Admitting: Behavioral Health

## 2023-10-02 DIAGNOSIS — F4323 Adjustment disorder with mixed anxiety and depressed mood: Secondary | ICD-10-CM | POA: Diagnosis not present
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
## 2023-10-06 ENCOUNTER — Encounter (HOSPITAL_BASED_OUTPATIENT_CLINIC_OR_DEPARTMENT_OTHER): Payer: Self-pay | Admitting: Family Medicine

## 2023-10-07 ENCOUNTER — Encounter: Payer: Self-pay | Admitting: Physical Therapy

## 2023-10-07 ENCOUNTER — Ambulatory Visit: Attending: Internal Medicine | Admitting: Physical Therapy

## 2023-10-07 DIAGNOSIS — R2689 Other abnormalities of gait and mobility: Secondary | ICD-10-CM | POA: Insufficient documentation

## 2023-10-07 DIAGNOSIS — R0602 Shortness of breath: Secondary | ICD-10-CM | POA: Diagnosis present

## 2023-10-07 DIAGNOSIS — R2681 Unsteadiness on feet: Secondary | ICD-10-CM | POA: Diagnosis present

## 2023-10-07 DIAGNOSIS — M6281 Muscle weakness (generalized): Secondary | ICD-10-CM | POA: Insufficient documentation

## 2023-10-07 MED ORDER — OLMESARTAN MEDOXOMIL 20 MG PO TABS: 40.0000 mg | ORAL_TABLET | Freq: Every day | ORAL | 3 refills | Status: DC
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
## 2023-10-09 ENCOUNTER — Ambulatory Visit: Admitting: Behavioral Health

## 2023-10-09 ENCOUNTER — Encounter: Payer: Self-pay | Admitting: Behavioral Health

## 2023-10-09 ENCOUNTER — Other Ambulatory Visit: Payer: Self-pay | Admitting: *Deleted

## 2023-10-09 DIAGNOSIS — F4323 Adjustment disorder with mixed anxiety and depressed mood: Secondary | ICD-10-CM

## 2023-10-09 NOTE — Progress Notes (Signed)
 [facility] Counselor/Therapist Progress Note  Patient ID: Shannon [name], MRN: [id number],    Date: October 09, 2023  Time Spent: 57 minutes, 3:00pm until 3:57 PM this session was held via video teletherapy. The patient consented to the video teletherapy and was located in her home during this session. She is aware it is the responsibility of the patient to secure confidentiality on her end of the session. The provider was in a private home office for the duration of this session.     Reported Symptoms: Anxiety, depression  Mental Status Exam: Appearance:  Well Groomed     Behavior: Appropriate  Motor: Normal  Speech/Language:  Clear and Coherent  Affect: Appropriate for  Mood: normal  Thought process: normal  Thought content:   WNL  Sensory/Perceptual disturbances:   WNL  Orientation: oriented to person, place, time/date, situation, day of week, month of year, and year  Attention: Good  Concentration: Good  Memory: WNL  Fund of knowledge:  Good  Insight:   Good  Judgment:  Good  Impulse Control: Good   Risk Assessment: Danger to Self:  No Self-injurious Behavior: No Danger to Others: No Duty to Warn:no Physical Aggression / Violence:No  Access to Firearms a concern: No  Gang Involvement:No   Subjective: 61 the patient continues to improve.  She is still doing physical therapy can feel herself getting stronger.  Currently she is only approved for 3 more visits and is hoping that insurance will allow for more because she sees the progress that she is making.  She is meeting with her cardiologist next week as a follow-up to the recent procedure.  Her blood pressure is staying consistently in a good range.  She is sleeping better.  She is still working to get past the anger that she feels at her ex-husband.  She says that thoughts pop into her head of all the times that he was in control of things that he needed to not be in control of and how that affected  her center children's lives.  Recognize that he had a mental illness and saw that in her husband's mother also.  She said that he had narcissistic tendencies but he would never admit that he had any issues and controlled even the smallest details.  Some anger creeps him thinking that he is still controlling some small things but also controlling her thoughts so we started looking at ways to start to change the thought process.  She is trying to whenever 1 of those thoughts comes up about how she is not getting what she hoped to have in the retirement that she replaces it with something that she is grateful for.  I encouraged her to continue that.  She says that his ashes are in the mantle in the home that she shares with her daughter and every day she walks but she expresses anger at him.  I suggest that she have her daughter put that in another room where she does not have to be reminded of it every day.  Also encouraged her to write a letter to her husband honestly explaining how she feels so that she can have a healthier more productive way to vent that.  She does contract for safety having no thoughts of hurting herself or anyone else. Interventions: Cognitive Behavioral Therapy  Diagnosis: Adjustment disorder with mixed anxiety and depressed mood.  Plan: I will meet with the patient every 2 weeks via video session [place]. Plan:To use cognitive behavioral  therapy principles as well as elements of dialectical behavior therapy.  Goals are to reduce anxiety and depression by at least 50% with a target date of November 06, 2022.  Goals are to have less sadness as indicated by PH-9 scores as well as patient report.  We also work on improving mood and return to a healthier level of functioning as defined by her goals for being happy, identify causes and process triggers for depressed mood.  We will use cognitive behavioral therapy to explore and replace unhealthy thoughts and behavior patterns contributing to  depression.  I will continue to encourage shearing of feelings related to the causes and symptoms of depression as well as teach and encouraged use of coping skills for management of depressive symptoms.  We also will work to improve the patient's ability to manage anxiety symptoms and better handle stress, identify causes for anxiety and explore ways for reduction of anxiety in addition to resolving conflicts contributing to anxiety and managing thoughts and worrisome thinking is contributing to anxiety.  Interventions will include providing education about anxiety, facilitate problem solution skills, teaching coping skills for managing anxiety such as grounding exercises, progressive muscle relaxation and cognitive re framing etc.  We will also use cognitive behavioral therapy to identify and change anxiety provoking thoughts and behavior patterns as well as using DBT distress tolerance and mindfulness skills. I reviewed the treatment plan goals with the patient who agreed to continue with goals as stated above. Progress: 35%New target date will be October 31st, 2025. Lorrene CHRISTELLA Hasten, [facility]                                                Lorrene CHRISTELLA Hasten, [facility]               Lorrene CHRISTELLA Hasten, [facility]               Lorrene CHRISTELLA Hasten, [facility]               Lorrene CHRISTELLA Hasten, [facility]               Lorrene CHRISTELLA Hasten, [facility]               Lorrene CHRISTELLA Hasten, [facility]               Lorrene CHRISTELLA Hasten, [facility]               Lorrene CHRISTELLA Hasten, [facility]               Lorrene CHRISTELLA Hasten, [facility]               Lorrene CHRISTELLA Hasten, [facility]               Lorrene CHRISTELLA Hasten, [facility]               Lorrene CHRISTELLA Hasten, [facility]               Lorrene CHRISTELLA Hasten, [facility]               Lorrene CHRISTELLA Hasten, [facility]               Lorrene CHRISTELLA Hasten,  [facility]               Lorrene CHRISTELLA Hasten, [facility]  Lorrene CHRISTELLA Hasten, [facility]               Lorrene CHRISTELLA Hasten, [facility]

## 2023-10-09 NOTE — Patient Instructions (Signed)
 Visit Information  Thank you for taking time to visit with me today. Please don't hesitate to contact me if I can be of assistance to you before our next scheduled appointment.  Our next appointment is by telephone on 11/09/23 at 230 pm Please call the care guide team at [phone] if you need to cancel or reschedule your appointment.   Following is a copy of your care plan:   Goals Addressed   None     Please call the Suicide and Crisis Lifeline: 988 call the [place]  National Suicide Prevention Lifeline: 234-846-5086 or TTY: 310-704-0375 TTY 6265417686) to talk to a trained counselor call 1-800-273-TALK (toll free, 24 hour hotline) go to [facility] [address], [place] [phone]) call 911 if you are experiencing a Mental Health or Behavioral Health Crisis or need someone to talk to.  Patient verbalizes understanding of instructions and care plan provided today and agrees to view in MyChart. Active MyChart status and patient understanding of how to access instructions and care plan via MyChart confirmed with patient.     Hania Cerone L. Ramonita, RN, BSN, CCM [place]  [facility], [facility] RN Care Manager Direct Dial: [phone]  Fax: [phone]
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
## 2023-10-12 NOTE — Progress Notes (Signed)
   ADVANCED HEART FAILURE CLINIC NOTE  Referring Physician: Knute [name], *  Primary Care: Knute [name], FNP Primary Cardiologist: Dr. Arnetha Heart Failure: Ria Commander, DO  CC: HFrEF  HPI: Shannon [name] is a 73 y.o. female with atrial fibrillation, HFrEF, SSS, COPD presenting today to establish care after recent hospitalization.   Pertinent Family & social hx: see below  Cardiac History:  - Afib diagnosed in 2017 with PVI ablations in 2018, 2022 - CVA in 2/23, Xarelto  discontinued & transition to apixaban .  - Tikosyn  2023 discontinued due to QT prolongation; stopped amio for blurred vision.  - Admitted 8/25 with atrial fibrillation & volume overload. TTE w/ LVEF 30-35%. Long history of atrial fibrillation with intolerance to anti-arryhtmics.  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  She can perform all ADLs independently.  She does become quickly fatigued after moderate exertion.  After adjusting diuretics she no longer struggles with lower extremity edema.  PHYSICAL EXAM: There were no vitals filed for this visit.  Lungs- normal work of breathing CARDIAC:  JVP: 7 cm          Normal rate with regular rhythm. no murmur.  Pulses 2+. no edema.  ABDOMEN: soft EXTREMITIES: Warm and well perfused.   DATA REVIEW  ECG: 09/02/23: atrial fibrillation as per my personal interpretation  ECHO: 07/07/23: LVEF 30-35%, normal RV function. Global hypokinesis.    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 She had a PET perfusion in 06/17/23 with no evidence of ischemia or infarction. Moderate coronary calcifications noted on CT imaging. Low risk study overall despite reduced EF (38%).  - NYHA Class: NYHA IIB-III - Volume status: Lasix  20mg  daily; additional 20mg  PRN  based on daily weights.  - GDMT:  -  RAASi: olemsartan 40mg  daily; SBP at home 140s-160 today. Add on amlodipine  5mg  daily  Beta-blocker: toprol  100mg  daily -  Spironolactone : intolerant.  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  - Advanced therapies: Not a candidate currently.  - Reviewed labs: sCr 0.89, BNP 1190 (elevated today, appears euvolemic, 2/2 recurrent afib?). Discussed diuretic dosing based on weights extensively.   2. Atrial fibrillation  - As noted above; very symptomatic / difficult to control afib w/ associated tachy-brady now s/p dcPPM in 3/25.  - Unable to tolerate multiple anti-arrythmics.  - Planning for AVN ablation in 9/25; she has LBBP.   I spent 60 minutes caring for this patient today including face to face time, ordering and reviewing labs, reviewing records & echocardiograms noted above, counseling on diuretic dosing, seeing the patient, documenting in the record, and arranging follow ups.   Aditya Sabharwal Advanced Heart Failure Mechanical Circulatory Support

## 2023-10-13 ENCOUNTER — Ambulatory Visit: Admitting: Physical Therapy

## 2023-10-13 ENCOUNTER — Telehealth: Payer: Self-pay | Admitting: Physician Assistant

## 2023-10-13 ENCOUNTER — Telehealth (HOSPITAL_COMMUNITY): Payer: Self-pay

## 2023-10-13 DIAGNOSIS — M6281 Muscle weakness (generalized): Secondary | ICD-10-CM

## 2023-10-13 DIAGNOSIS — R2681 Unsteadiness on feet: Secondary | ICD-10-CM

## 2023-10-13 DIAGNOSIS — R0602 Shortness of breath: Secondary | ICD-10-CM

## 2023-10-13 NOTE — Telephone Encounter (Signed)
 Pt requesting a c/b before her appt she would like to make a request

## 2023-10-13 NOTE — Telephone Encounter (Signed)
 Called to confirm/remind patient of their appointment at the [facility] on 10/8/.   Appointment:   [x] Confirmed  [] Left mess   [] No answer/No voice mail  [] VM Full/unable to leave message  [] Phone not in service  Patient reminded to bring all medications and/or complete list.  Confirmed patient has transportation. Gave directions, instructed to utilize valet parking.

## 2023-10-13 NOTE — Telephone Encounter (Signed)
 Per patient she called and requested for this visit  and for all future visit she does wont to see Thom Sluder, PA. Per patient he does not  know how to communicate with patients. Made patient aware that this will be forward to appropriate staff and noted on pt visit note for upcoming visit.

## 2023-10-13 NOTE — Therapy (Signed)
 OUTPATIENT PHYSICAL THERAPY NEURO   Patient Name: Shannon [name] MRN: [id number] DOB:April 26, 1950, 73 y.o., female Today's Date: 10/13/2023   PCP: Thersia Stark REFERRING PROVIDER: Toribio Furnace  END OF SESSION:  PT End of Session - 10/13/23 1055     Visit Number 4    Date for Recertification  12/02/23    Authorization Type U[employer]    PT Start Time 1055    PT Stop Time 1140    PT Time Calculation (min) 45 min          Past Medical History:  Diagnosis Date   Acute systolic heart failure (HCC) 07/08/2023   Allergy    Anemia    Anxiety    Arthritis 1990's   neck and lower back (03/25/2016)   Asthma 1990s X 1   short term inhaler use    CAD (coronary artery disease)    Cardiac pacemaker in situ    MDT   CHF (congestive heart failure) (HCC)    Chronic lower back pain    COVID-19 03/06/2022   Degenerative disorder of bone    Depression    Diastolic dysfunction    Drug-induced lupus erythematosus    HCTZ induced; still gettin over it (03/25/2016)   Foot swelling 05/19/2022   GERD (gastroesophageal reflux disease) 1990's   Headache, unspecified headache type 03/06/2022   Herniated disc, cervical    Hospital discharge follow-up 03/14/2022   Hyperlipidemia    Hypertension    Neuromuscular disorder (HCC)    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 HTN   Obesity (BMI 30-39.9) 11/07/2022   Orthostatic hypotension    OSA on CPAP    Osteopenia    Osteoporosis 2012   PAF (paroxysmal atrial fibrillation) (HCC)    Paroxysmal atrial fibrillation (HCC) 12/13/2021   Phlebitis after infusion 03/18/2023   Pinched nerve in neck    PND (post-nasal drip) 12/18/2022   Rapid atrial fibrillation (HCC) 07/06/2023   Sinus node dysfunction (HCC)    Sinus pressure 12/18/2022   Sleep apnea 1990's   wears CPAP   Stroke (HCC)    T12 compression fracture (HCC) 11/2015   Vitamin D  deficiency    Whiplash injury 06/07/2010   Past Surgical History:  Procedure  Laterality Date   APPENDECTOMY  1990s   ATRIAL FIBRILLATION ABLATION N/A 03/25/2016   Procedure: Atrial Fibrillation Ablation;  Surgeon: Lynwood Rakers, MD;  Location: [facility] INVASIVE CV LAB;  Service: Cardiovascular;  Laterality: N/A;   ATRIAL FIBRILLATION ABLATION N/A 01/31/2020   Procedure: ATRIAL FIBRILLATION ABLATION;  Surgeon: Rakers Lynwood, MD;  Location: [facility] INVASIVE CV LAB;  Service: Cardiovascular;  Laterality: N/A;   ATRIAL FIBRILLATION ABLATION N/A 12/13/2021   Procedure: ATRIAL FIBRILLATION ABLATION;  Surgeon: [name], MD;  Location: [facility] INVASIVE CV LAB;  Service: Cardiovascular;  Laterality: N/A;   AV NODE ABLATION N/A 09/15/2023   Procedure: AV NODE ABLATION;  Surgeon: [name], MD;  Location: [facility] INVASIVE CV LAB;  Service: Cardiovascular;  Laterality: N/A;   CARDIOVERSION N/A 07/09/2023   Procedure: CARDIOVERSION;  Surgeon: Okey Vina GAILS, MD;  Location: [facility] INVASIVE CV LAB;  Service: Cardiovascular;  Laterality: N/A;   COLONOSCOPY     FOREARM FRACTURE SURGERY Left ~ 02/2011   broke arm; shattered wrist   FOREARM HARDWARE REMOVAL Left ~ 07/2011   implantable loop recorder placement  03/07/2019   Medtronic Reveal Linq model LNQ 22 implantable loop recorder ([id number]) implanted by Dr Rakers for Afib management   INSERT /  REPLACE / REMOVE PACEMAKER  2025   LOOP RECORDER REMOVAL N/A 03/09/2023   Procedure: LOOP RECORDER REMOVAL;  Surgeon: Kennyth Chew, MD;  Location: [facility] INVASIVE CV LAB;  Service: Cardiovascular;  Laterality: N/A;   PACEMAKER IMPLANT N/A 03/09/2023   Procedure: PACEMAKER IMPLANT;  Surgeon: Kennyth Chew, MD;  Location: [facility] INVASIVE CV LAB;  Service: Cardiovascular;  Laterality: N/A;   Spinal Nerve Ablation     TEE WITHOUT CARDIOVERSION N/A 03/24/2016   Procedure: TRANSESOPHAGEAL ECHOCARDIOGRAM (TEE);  Surgeon: Jerel Balding, MD;  Location: [facility] ENDOSCOPY;  Service: Cardiovascular;  Laterality: N/A;   Patient Active Problem List   Diagnosis Date Noted    Hypertensive urgency 08/28/2023   Hypokalemia 08/28/2023   Acute on chronic systolic CHF (congestive heart failure) (HCC) 08/27/2023   Chronic obstructive pulmonary disease with acute exacerbation (HCC) 07/14/2023   Chronic HFrEF (heart failure with reduced ejection fraction) (HCC) 07/06/2023   Atrial fibrillation with RVR (HCC) 03/07/2023   History of tachycardia-bradycardia syndrome (HCC) 03/07/2023   Demand ischemia (HCC) 03/07/2023   Current severe episode of major depressive disorder without psychotic features without prior episode (HCC) 03/06/2022   GAD (generalized anxiety disorder) 03/06/2022   Caregiver role strain 03/06/2022   Adjustment disorder with mixed anxiety and depressed mood 03/03/2022   Diplopia 01/15/2022   History of CVA (cerebrovascular accident) 01/15/2022   Thoracic spine pain 11/22/2021   Tinnitus of both ears 11/22/2021   Bilateral lower extremity edema 11/22/2021   Acute CVA (cerebrovascular accident) (HCC) 02/06/2021   Dyslipidemia, goal LDL below 130 07/10/2020   Iron  deficiency anemia 07/10/2020   Shortness of breath 12/23/2019   Asthma 12/23/2019   Morbid obesity (HCC) 09/11/2017   Chronic fatigue 05/27/2016   A-fib (HCC) 03/25/2016   Typical atrial flutter (HCC)    Chronic diastolic CHF (congestive heart failure) (HCC) 12/08/2015   Atrial fibrillation with rapid ventricular response (HCC) 12/08/2015   Thoracic compression fracture (HCC) 11/16/2015   Orthostatic hypotension 11/15/2015   Osteopenia 09/12/2015   Vitamin D  deficiency disease 08/29/2015   Polymyalgia rheumatica 04/18/2015   OSA (obstructive sleep apnea) 03/04/2015   Myalgia 11/22/2014   Primary hypertension 08/21/2014   Anxiety and depression 08/21/2014    ONSET DATE: 08/31/23  REFERRING DIAG:  Mobility status PT Visit Diagnosis: Unsteadiness on feet (R26.81);Muscle weakness (generalized) (M62.81)      THERAPY DIAG:  Gait instability  Muscle weakness  (generalized)  Shortness of breath  Rationale for Evaluation and Treatment: Rehabilitation  SUBJECTIVE:  SUBJECTIVE STATEMENT: doing pretty well, ex are helping me get stronger but I did struggle getting up curb over weekend  Pt accompanied by: self  PERTINENT HISTORY: long history of atrial fibrillation with multiple prior catheter ablations, history of heart failure, dyspnea History of CVA  PAIN:  Are you having pain? Yes: NPRS scale: 4/10 Pain location: back Pain description: lightning bolt pain, it started after they did the AV node ablation on 09/15/23 it has eased up some  Aggravating factors: pants rubbing on it Relieving factors: Tylenol   PRECAUTIONS: None  RED FLAGS: None   WEIGHT BEARING RESTRICTIONS: No  FALLS: Has patient fallen in last 6 months? Yes. Number of falls 1, was moving houses and tripped over a step   LIVING ENVIRONMENT: Lives with: lives with their daughter Lives in: House/apartment Stairs: Yes: Internal: full set but does not go up them steps; on right going up and External: 3 steps; can reach both Has following equipment at home: Vannie - 2 wheeled, Environmental consultant - 4 wheeled, and Wheelchair (manual)  PLOF: Independent, Independent with basic ADLs, Independent with community mobility without device, and Independent with gait  PATIENT GOALS: whatever you can do to make me better physically   OBJECTIVE:  Note: Objective measures were completed at Evaluation unless otherwise noted.  DIAGNOSTIC FINDINGS: N/A  COGNITION: Overall cognitive status: Within functional limits for tasks assessed   SENSATION: Light touch: some numbness in toes and spreads to feet   COORDINATION: Decrease balance and coordination   POSTURE: rounded shoulders  LOWER EXTREMITY ROM:  WFL  BLE, some pain with R hip flexion   LOWER EXTREMITY MMT:    MMT Right Eval Left Eval  Hip flexion 3+ 4  Hip extension    Hip abduction 4+ 5  Hip adduction    Hip internal rotation    Hip external rotation    Knee flexion 5 5  Knee extension 4 4+  Ankle dorsiflexion    Ankle plantarflexion    Ankle inversion    Ankle eversion    (Blank rows = not tested)   STAIRS: Findings: Level of Assistance: Modified independence and Stair Negotiation Technique: Step to Pattern with Bilateral Rails GAIT: Findings: Gait Characteristics: narrow BOS, poor foot clearance- Right, and poor foot clearance- Left, unstable especially with turns, unsteady on her feet   FUNCTIONAL TESTS:  5 times sit to stand: 16s Timed up and go (TUG): 11.30s 3 minute walk test: 675ft 97%, 80bpm BERG 45/56                                                                                                                               TREATMENT DATE:   10/13/23 Bike L 3 O2  98    HR  80 STS with wt ball press 10 x 2 sets Resisted gait 30# 5 x fwd and back and 3 x each side Leg press 30# 2 sets 10 ( seat on 7,  tried 6 but too difficult) 6 in step ups with light HHA 10 x BIL HS Curls 25lb 2x10 Leg Ext 10lb 2x10 6# farmer carry 1 lap each hand Stepping over objects CGA     10/07/23 Bike L 2.5 x 6 min  O2 98%  Goals  Sit to stands holding 2lb 2x10 HS Curls 25lb 2x10 Leg Ext 10lb 2x10 6in step ups x10 each  4in lateral step ups x10 Heel Raises black bar 2x10   10/01/23  Nustep L 2 5 min  O2 after 96 Red tband 2 sets 10 HS curl,LAQ,clams,hip abd and hip flexion STS with 3# chest press 2 sets 5, O2 after 95 Red tband 10 x SL hip flex,ext and abd Feet on ball bridge,KTC and obl  10 x each Iso abdominals with ball 10 x hold 3 sec Resisted gait 3 x 4 ways    09/23/23 EVAL    PATIENT EDUCATION: Education details: POC, HEP Person educated: Patient Education method: Software engineer Education comprehension: verbalized understanding and returned demonstration  HOME EXERCISE PROGRAM: Access Code: [email or web address] URL: [email or web address] Date: 10/01/2023 Prepared by: Jerris Fleer  Exercises - Standing Hip Flexion with Resistance Loop  - 1 x daily - 7 x weekly - 1 sets - 10 reps - Hip Extension with Resistance Loop  - 1 x daily - 7 x weekly - 1 sets - 10 reps - Hip Abduction with Resistance Loop  - 1 x daily - 7 x weekly - 1 sets - 10 reps - Seated Long Arc Quad  - 1 x daily - 7 x weekly - 1 sets - 10 reps - Seated March  - 1 x daily - 7 x weekly - 1 sets - 10 reps - Seated Hip Abduction with Resistance  - 1 x daily - 7 x weekly - 10 reps  Access Code: [email or web address] URL: [email or web address] Date: 09/23/2023 Prepared by: Almetta Fam  Exercises - Sit to Stand  - 1 x daily - 7 x weekly - 2 sets - 10 reps - Standing Single Leg Stance with Counter Support  - 1 x daily - 7 x weekly - 10 reps - 10 hold - Standing Tandem Balance with Counter Support  - 1 x daily - 7 x weekly - 10 reps - 15 hold  GOALS: Goals reviewed with patient? Yes  SHORT TERM GOALS: Target date: 10/28/23  Patient will be independent with initial HEP. Baseline:  Goal status: 10/01/23 MET  2.  Patient will demonstrate improved functional LE strength as demonstrated by 5xSTS <13s. Baseline: 16s Goal status: 14.60 sec Progressing 10/07/23   LONG TERM GOALS: Target date: 12/02/23  Patient will be independent with advanced/ongoing HEP to improve outcomes and carryover.  Baseline:  Goal status: INITIAL  2.  Patient will be able to ambulate 800' in with less staggering and unsteadiness Baseline: 641ft some SOB and needs SBA Goal status: INITIAL  3.  Patient will score 53 on Berg Balance test to demonstrate lower risk of falls. (MCID= 8 points) .  Baseline: 45 Goal status: INITIAL  4.  Patient will able to return to her normal household chores/routine  without SOB and fatigue.  Baseline:  Goal status: INITIAL  5.  Patient will reports decrease in pain in R hip and groin <2/10. Baseline: 7/10 at worst Goal status: ongoing 10/07/23   ASSESSMENT:  CLINICAL IMPRESSION:   Continues with some functional interventions. Some fatigue present with step ups and resisted gait. Rest given as needed  during session. Pt is progressing and pleased with progress and motivated to get stronger.   Patient is a 73 y.o. female who was seen today for physical therapy evaluation and treatment for generalized weakness. She has an extensive medical history of heart issues, including a-fib and heart failure. She reports being sick in June and everything went downhill since then. Recent AV node ablation was on 09/15/23. Pt reports difficulty with her normal activities due to fatigue, shortness or breath, and weakness. She reports some staggering and wobbling when walking but she also thinks she has some vision problems that contribute to this. She does have a walker that she uses for longer distances. With functional tests completed today, she does present as a fall risk. Therefore, she will benefit from skilled PT to address weakness, endurance, gait, and balance impairments to decrease risk for fall and help her return to PLOF.  OBJECTIVE IMPAIRMENTS: Abnormal gait, cardiopulmonary status limiting activity, decreased activity tolerance, decreased balance, decreased coordination, decreased endurance, decreased strength, increased edema, and pain.   ACTIVITY LIMITATIONS: carrying, squatting, stairs, and locomotion level  PARTICIPATION LIMITATIONS: meal prep, cleaning, laundry, shopping, community activity, and yard work  PERSONAL FACTORS: Age, Fitness, and 1-2 comorbidities: a-fib, heart failure are also affecting patient's functional outcome.   REHAB POTENTIAL: Good  CLINICAL DECISION MAKING: Stable/uncomplicated  EVALUATION COMPLEXITY: Low  PLAN:  PT FREQUENCY:  2x/week  PT DURATION: 10 weeks  PLANNED INTERVENTIONS: 97110-Therapeutic exercises, 97530- Therapeutic activity, [id number]- Neuromuscular re-education, 97535- Self Care, [id number]- Manual therapy, [phone]- Gait training, Patient/Family education, Balance training, Stair training, Cryotherapy, and Moist heat  PLAN FOR NEXT SESSION: assess and progress.look in next couple visits about educ on bike at home with being mindful of HR   Dorothyann Mourer,ANGIE, PTA 10/13/2023, 10:55 AM

## 2023-10-14 ENCOUNTER — Ambulatory Visit (HOSPITAL_COMMUNITY): Payer: Self-pay | Admitting: Family Medicine

## 2023-10-14 ENCOUNTER — Ambulatory Visit (HOSPITAL_COMMUNITY)
Admission: RE | Admit: 2023-10-14 | Discharge: 2023-10-14 | Disposition: A | Discharge status: Home / Self Care | Source: Ambulatory Visit | Attending: Family Medicine | Admitting: Family Medicine

## 2023-10-14 ENCOUNTER — Encounter (HOSPITAL_COMMUNITY): Payer: Self-pay

## 2023-10-14 VITALS — BP 136/82 | HR 80 | Ht 67.0 in | Wt 184.4 lb

## 2023-10-14 DIAGNOSIS — I5022 Chronic systolic (congestive) heart failure: Secondary | ICD-10-CM | POA: Insufficient documentation

## 2023-10-14 DIAGNOSIS — Z79899 Other long term (current) drug therapy: Secondary | ICD-10-CM | POA: Diagnosis not present

## 2023-10-14 DIAGNOSIS — I4891 Unspecified atrial fibrillation: Secondary | ICD-10-CM | POA: Diagnosis not present

## 2023-10-14 DIAGNOSIS — Z7984 Long term (current) use of oral hypoglycemic drugs: Secondary | ICD-10-CM | POA: Insufficient documentation

## 2023-10-14 DIAGNOSIS — Z95810 Presence of automatic (implantable) cardiac defibrillator: Secondary | ICD-10-CM | POA: Insufficient documentation

## 2023-10-14 DIAGNOSIS — I110 Hypertensive heart disease with heart failure: Secondary | ICD-10-CM | POA: Insufficient documentation

## 2023-10-14 DIAGNOSIS — J449 Chronic obstructive pulmonary disease, unspecified: Secondary | ICD-10-CM | POA: Diagnosis not present

## 2023-10-14 DIAGNOSIS — I495 Sick sinus syndrome: Secondary | ICD-10-CM | POA: Insufficient documentation

## 2023-10-14 DIAGNOSIS — I10 Essential (primary) hypertension: Secondary | ICD-10-CM | POA: Diagnosis not present

## 2023-10-14 LAB — BASIC METABOLIC PANEL WITH GFR
Anion gap: 9 (ref 5–15)
BUN: 13 mg/dL (ref 8–23)
CO2: 29 mmol/L (ref 22–32)
Calcium: 9.1 mg/dL (ref 8.9–10.3)
Chloride: 101 mmol/L (ref 98–111)
Creatinine, Ser: 0.87 mg/dL (ref 0.44–1.00)
GFR, Estimated: >60 mL/min (ref >60)
Glucose, Bld: 94 mg/dL (ref 70–99)
Potassium: 3.7 mmol/L (ref 3.5–5.1)
Sodium: 139 mmol/L (ref 135–145)

## 2023-10-14 LAB — BRAIN NATRIURETIC PEPTIDE: B Natriuretic Peptide: 330.8 pg/mL — ABNORMAL HIGH (ref 0.0–100.0)

## 2023-10-14 MED ORDER — POTASSIUM CHLORIDE CRYS ER 20 MEQ PO TBCR: 40.0000 meq | EXTENDED_RELEASE_TABLET | Freq: Every day | ORAL | 5 refills | Status: DC

## 2023-10-14 MED ORDER — OLMESARTAN MEDOXOMIL 40 MG PO TABS: 40.0000 mg | ORAL_TABLET | Freq: Every day | ORAL | 3 refills | Status: AC

## 2023-10-14 MED ORDER — FUROSEMIDE 20 MG PO TABS: 80.0000 mg | ORAL_TABLET | Freq: Every day | ORAL | 5 refills | Status: DC

## 2023-10-14 NOTE — Addendum Note (Signed)
 Addended by: RONNETTE DAMIEN SQUIBB on: 10/14/2023 10:48 AM   Modules accepted: Orders

## 2023-10-14 NOTE — Telephone Encounter (Signed)
 Pt aware and voiced understandinf

## 2023-10-14 NOTE — Progress Notes (Signed)
 ReDS Vest / Clip - 10/14/23 1110       ReDS Vest / Clip   Station Marker C    Ruler Value 25    ReDS Value Range High volume overload    ReDS Actual Value 46

## 2023-10-14 NOTE — Patient Instructions (Signed)
 Medication Changes:  INCREASE FUROSEMIDE  TO 80MG  ONCE DAILY   INCREASE POTASSIUM TO ONCE DAILY   Lab Work:  Labs done today, your results will be available in MyChart, we will contact you for abnormal readings.  AND THEN LABS AGAIN IN 10-14 DAYS AS SCHEDULED   Follow-Up in: 3 WEEKS WITH APP AS SCHEDULED   AND THEN IN 3 MONTHS WITH DR. GARDENIA WITH AN ECHO PLEASE CALL OUR OFFICE AROUND NOVEMBER/DECEMBER TO GET SCHEDULED FOR YOUR APPOINTMENT. PHONE NUMBER IS [phone] OPTION 2   At the [facility], you and your health needs are our priority. We have a designated team specialized in the treatment of Heart Failure. This Care Team includes your primary Heart Failure Specialized Cardiologist (physician), Advanced Practice Providers (APPs- Physician Assistants and Nurse Practitioners), and Pharmacist who all work together to provide you with the care you need, when you need it.   You may see any of the following providers on your designated Care Team at your next follow up:  Dr. Toribio Fuel Dr. Ezra Shuck Dr. Ria GARDENIA Dr. Odis Brownie Greig Mosses, NP Caffie Shed, [place] [facility] [place], [place] Beckey Coe, NP [place] Lee, NP Tinnie Redman, PharmD   Please be sure to bring in all your medications bottles to every appointment.   Need to Contact Us :  If you have any questions or concerns before your next appointment please send us  a message through [place] or call our office at [phone].    TO LEAVE A MESSAGE FOR THE NURSE SELECT OPTION 2, PLEASE LEAVE A MESSAGE INCLUDING: YOUR NAME DATE OF BIRTH CALL BACK NUMBER REASON FOR CALL**this is important as we prioritize the call backs  YOU WILL RECEIVE A CALL BACK THE SAME DAY AS LONG AS YOU CALL BEFORE 4:00 PM

## 2023-10-14 NOTE — Progress Notes (Unsigned)
 Electrophysiology Office Note:   Date:  10/15/2023  ID:  Shannon [name], DOB [birth year], MRN [id number]  Primary Cardiologist: Annabella Scarce, MD Primary Heart Failure: None Electrophysiologist: OLE ONEIDA HOLTS, MD       History of Present Illness:   Shannon [name] is a 73 y.o. female with h/o AF, AFL, HFrEF, HTN, HLD, COPD, OSA, depression / anxiety, IDA, polymyalgia rheumatica, drug induced lupus (hydrochlorothiazide ), CVA, morbid obesity seen today for routine electrophysiology follow-up s/p AV node Ablation.  Since last being seen in our clinic the patient reports she has been anxious about what to expect post AV node ablation. She was worried she should not drive, would she know if she had AF, what should she do if she does have AF.  She has been working with PT.  Has not been sleeping well.   She denies chest pain, palpitations, dyspnea, PND, orthopnea, nausea, vomiting, dizziness, syncope, edema, weight gain, or early satiety.    Review of systems complete and found to be negative unless listed in HPI.    EP Information / Studies Reviewed:    EKG is ordered today. Personal review as below.  EKG Interpretation Date/Time:  Thursday October 15 2023 10:29:36 EDT Ventricular Rate:  80 PR Interval:    QRS Duration:  112 QT Interval:  402 QTC Calculation: 463 R Axis:   74  Text Interpretation: AV dual-paced rhythm Confirmed by Aniceto Jarvis ([id number]) on 10/15/2023 11:12:38 AM   PPM Interrogation-  reviewed in detail today,  See PACEART report.  Device History: Medtronic Dual Chamber PPM implanted 03/09/23 for Sinus Node Dysfunction / AF   Arrhythmia / AAD / Pertinent EP Studies AF > dx 2017 AFL EPS 03/25/16 > PVI ablation  EPS 01/31/20 > PVI redo ablation  EPS 12/13/21 > PVI ablation  Failed AAD's > flecainide  (fatigue), Tikosyn  (prolonged QT), amiodarone  (blurred vision, fatigue, SOB, mood swings) AV Node Ablation 09/15/23   Risk Assessment/Calculations:     CHA2DS2-VASc Score = 6   This indicates a 9.7% annual risk of stroke. The patient's score is based upon: CHF History: 1 HTN History: 1 Diabetes History: 0 Stroke History: 2 Vascular Disease History: 0 Age Score: 1 Gender Score: 1             Physical Exam:   VS:  BP (!) 148/83   Pulse 80   Ht 5' 7 (1.702 m)   Wt 187 lb 1.6 oz (84.9 kg)   SpO2 99%   BMI 29.30 kg/m    Wt Readings from Last 3 Encounters:  10/15/23 187 lb 1.6 oz (84.9 kg)  10/14/23 184 lb 6.4 oz (83.6 kg)  09/30/23 182 lb (82.6 kg)     GEN: Well nourished, well developed in no acute distress NECK: No JVD; No carotid bruits CARDIAC: Regular rate and rhythm, no murmurs, rubs, gallops. PPM site well healed, no tethering RESPIRATORY:  Clear to auscultation without rales, wheezing or rhonchi  ABDOMEN: Soft, non-tender, non-distended EXTREMITIES:  No edema; No deformity   ASSESSMENT AND PLAN:    Uncontrolled atrial arrhyhtmia s/p AV node ablation s/p Medtronic PPM  AF/AFL  CHA2DS2-VASc 6 -Normal PPM function -dependent  -See Pace Art report -rate reduced to 70 bpm, plan to bring back in 3 months and reduce to 60 bpm -rate response turned on  -Toprol  XL 100 mg daily  Secondary Hypercoagulable State  -continue Eliquis  5mg  BID, dose reviewed and appropriate by age / wt   Hypertension  -mildly elevated in clinic >  pt reports she is anxious, controlled at home  OSA  -CPAP compliance encouraged    Disposition:   Follow up with EP APP in 3 months  Signed, Daphne Barrack, NP-C, AGACNP-BC [facility] - Electrophysiology  10/15/2023, 12:23 PM

## 2023-10-14 NOTE — Progress Notes (Signed)
 Remote PPM Transmission

## 2023-10-15 ENCOUNTER — Ambulatory Visit: Admitting: Physical Therapy

## 2023-10-15 ENCOUNTER — Ambulatory Visit: Attending: Pulmonary Disease | Admitting: Pulmonary Disease

## 2023-10-15 ENCOUNTER — Encounter: Payer: Self-pay | Admitting: Pulmonary Disease

## 2023-10-15 ENCOUNTER — Encounter: Admitting: Pulmonary Disease

## 2023-10-15 VITALS — BP 148/83 | HR 80 | Ht 67.0 in | Wt 187.1 lb

## 2023-10-15 DIAGNOSIS — D6869 Other thrombophilia: Secondary | ICD-10-CM

## 2023-10-15 DIAGNOSIS — I495 Sick sinus syndrome: Secondary | ICD-10-CM | POA: Diagnosis not present

## 2023-10-15 DIAGNOSIS — Z950 Presence of cardiac pacemaker: Secondary | ICD-10-CM

## 2023-10-15 DIAGNOSIS — R2681 Unsteadiness on feet: Secondary | ICD-10-CM | POA: Diagnosis not present

## 2023-10-15 DIAGNOSIS — R0602 Shortness of breath: Secondary | ICD-10-CM

## 2023-10-15 DIAGNOSIS — I4819 Other persistent atrial fibrillation: Secondary | ICD-10-CM

## 2023-10-15 DIAGNOSIS — M6281 Muscle weakness (generalized): Secondary | ICD-10-CM

## 2023-10-15 LAB — CUP PACEART INCLINIC DEVICE CHECK
Date Time Interrogation Session: 20251009121947
Implantable Lead Connection Status: 753985
Implantable Lead Connection Status: 753985
Implantable Lead Implant Date: 20250303
Implantable Lead Implant Date: 20250303
Implantable Lead Location: 753859
Implantable Lead Location: 753860
Implantable Lead Model: 3830
Implantable Lead Model: 5076
Implantable Pulse Generator Implant Date: 20250303

## 2023-10-15 NOTE — Patient Instructions (Signed)
 Medication Instructions:  Your physician recommends that you continue on your current medications as directed. Please refer to the Current Medication list given to you today.  *If you need a refill on your cardiac medications before your next appointment, please call your pharmacy*  Lab Work: None ordered If you have labs (blood work) drawn today and your tests are completely normal, you will receive your results only by: MyChart Message (if you have MyChart) OR A paper copy in the mail If you have any lab test that is abnormal or we need to change your treatment, we will call you to review the results.  Follow-Up: At [facility], you and your health needs are our priority.  As part of our continuing mission to provide you with exceptional heart care, our providers are all part of one team.  This team includes your primary Cardiologist (physician) and Advanced Practice Providers or APPs (Physician Assistants and Nurse Practitioners) who all work together to provide you with the care you need, when you need it.  Your next appointment:   3 month(s)  Provider:   Daphne Barrack, NP

## 2023-10-15 NOTE — Therapy (Signed)
 OUTPATIENT PHYSICAL THERAPY NEURO   Patient Name: Shannon [name] MRN: [id number] DOB:[birth year], 73 y.o., female Today's Date: 10/15/2023   PCP: Thersia Stark REFERRING PROVIDER: Toribio Furnace  END OF SESSION:  PT End of Session - 10/15/23 1445     Visit Number 5    Date for Recertification  12/02/23    Authorization Type [employer]    PT Start Time 1445    PT Stop Time 1530    PT Time Calculation (min) 45 min          Past Medical History:  Diagnosis Date   Acute systolic heart failure (HCC) 07/08/2023   Allergy    Anemia    Anxiety    Arthritis 1990's   neck and lower back (03/25/2016)   Asthma 1990s X 1   short term inhaler use    CAD (coronary artery disease)    Cardiac pacemaker in situ    MDT   CHF (congestive heart failure) (HCC)    Chronic lower back pain    COVID-19 03/06/2022   Degenerative disorder of bone    Depression    Diastolic dysfunction    Drug-induced lupus erythematosus    HCTZ induced; still gettin over it (03/25/2016)   Foot swelling 05/19/2022   GERD (gastroesophageal reflux disease) 1990's   Headache, unspecified headache type 03/06/2022   Herniated disc, cervical    Hospital discharge follow-up 03/14/2022   Hyperlipidemia    Hypertension    Neuromuscular disorder (HCC)    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 HTN   Obesity (BMI 30-39.9) 11/07/2022   Orthostatic hypotension    OSA on CPAP    Osteopenia    Osteoporosis 2012   PAF (paroxysmal atrial fibrillation) (HCC)    Paroxysmal atrial fibrillation (HCC) 12/13/2021   Phlebitis after infusion 03/18/2023   Pinched nerve in neck    PND (post-nasal drip) 12/18/2022   Rapid atrial fibrillation (HCC) 07/06/2023   Sinus node dysfunction (HCC)    Sinus pressure 12/18/2022   Sleep apnea 1990's   wears CPAP   Stroke (HCC)    T12 compression fracture (HCC) 11/2015   Vitamin D  deficiency    Whiplash injury 06/07/2010   Past Surgical History:  Procedure  Laterality Date   APPENDECTOMY  1990s   ATRIAL FIBRILLATION ABLATION N/A 03/25/2016   Procedure: Atrial Fibrillation Ablation;  Surgeon: Lynwood Rakers, MD;  Location: [facility] INVASIVE CV LAB;  Service: Cardiovascular;  Laterality: N/A;   ATRIAL FIBRILLATION ABLATION N/A 01/31/2020   Procedure: ATRIAL FIBRILLATION ABLATION;  Surgeon: Rakers Lynwood, MD;  Location: [facility] INVASIVE CV LAB;  Service: Cardiovascular;  Laterality: N/A;   ATRIAL FIBRILLATION ABLATION N/A 12/13/2021   Procedure: ATRIAL FIBRILLATION ABLATION;  Surgeon: [name], MD;  Location: [facility] INVASIVE CV LAB;  Service: Cardiovascular;  Laterality: N/A;   AV NODE ABLATION N/A 09/15/2023   Procedure: AV NODE ABLATION;  Surgeon: [name], MD;  Location: [facility] INVASIVE CV LAB;  Service: Cardiovascular;  Laterality: N/A;   CARDIOVERSION N/A 07/09/2023   Procedure: CARDIOVERSION;  Surgeon: Okey Vina GAILS, MD;  Location: [facility] INVASIVE CV LAB;  Service: Cardiovascular;  Laterality: N/A;   COLONOSCOPY     FOREARM FRACTURE SURGERY Left ~ 02/2011   broke arm; shattered wrist   FOREARM HARDWARE REMOVAL Left ~ 07/2011   implantable loop recorder placement  03/07/2019   Medtronic Reveal Linq model LNQ 22 implantable loop recorder ([id number]) implanted by Dr Rakers for Afib management   INSERT /  REPLACE / REMOVE PACEMAKER  2025   LOOP RECORDER REMOVAL N/A 03/09/2023   Procedure: LOOP RECORDER REMOVAL;  Surgeon: Kennyth Chew, MD;  Location: [facility] INVASIVE CV LAB;  Service: Cardiovascular;  Laterality: N/A;   PACEMAKER IMPLANT N/A 03/09/2023   Procedure: PACEMAKER IMPLANT;  Surgeon: Kennyth Chew, MD;  Location: [facility] INVASIVE CV LAB;  Service: Cardiovascular;  Laterality: N/A;   Spinal Nerve Ablation     TEE WITHOUT CARDIOVERSION N/A 03/24/2016   Procedure: TRANSESOPHAGEAL ECHOCARDIOGRAM (TEE);  Surgeon: Jerel Balding, MD;  Location: [facility] ENDOSCOPY;  Service: Cardiovascular;  Laterality: N/A;   Patient Active Problem List   Diagnosis Date Noted    Hypertensive urgency 08/28/2023   Hypokalemia 08/28/2023   Acute on chronic systolic CHF (congestive heart failure) (HCC) 08/27/2023   Chronic obstructive pulmonary disease with acute exacerbation (HCC) 07/14/2023   Chronic HFrEF (heart failure with reduced ejection fraction) (HCC) 07/06/2023   Atrial fibrillation with RVR (HCC) 03/07/2023   History of tachycardia-bradycardia syndrome (HCC) 03/07/2023   Demand ischemia (HCC) 03/07/2023   Current severe episode of major depressive disorder without psychotic features without prior episode (HCC) 03/06/2022   GAD (generalized anxiety disorder) 03/06/2022   Caregiver role strain 03/06/2022   Adjustment disorder with mixed anxiety and depressed mood 03/03/2022   Diplopia 01/15/2022   History of CVA (cerebrovascular accident) 01/15/2022   Thoracic spine pain 11/22/2021   Tinnitus of both ears 11/22/2021   Bilateral lower extremity edema 11/22/2021   Acute CVA (cerebrovascular accident) (HCC) 02/06/2021   Dyslipidemia, goal LDL below 130 07/10/2020   Iron  deficiency anemia 07/10/2020   Shortness of breath 12/23/2019   Asthma 12/23/2019   Morbid obesity (HCC) 09/11/2017   Chronic fatigue 05/27/2016   A-fib (HCC) 03/25/2016   Typical atrial flutter (HCC)    Chronic diastolic CHF (congestive heart failure) (HCC) 12/08/2015   Atrial fibrillation with rapid ventricular response (HCC) 12/08/2015   Thoracic compression fracture (HCC) 11/16/2015   Orthostatic hypotension 11/15/2015   Osteopenia 09/12/2015   Vitamin D  deficiency disease 08/29/2015   Polymyalgia rheumatica 04/18/2015   OSA (obstructive sleep apnea) 03/04/2015   Myalgia 11/22/2014   Primary hypertension 08/21/2014   Anxiety and depression 08/21/2014    ONSET DATE: 08/31/23  REFERRING DIAG:  Mobility status PT Visit Diagnosis: Unsteadiness on feet (R26.81);Muscle weakness (generalized) (M62.81)      THERAPY DIAG:  Gait instability  Muscle weakness  (generalized)  Shortness of breath  Rationale for Evaluation and Treatment: Rehabilitation  SUBJECTIVE:  SUBJECTIVE STATEMENT: busy couple days with MD appts. Changed pacemaker down to 70 vs 80 and wants me to go to cardiac rehab after this heart failure MD says I am operating on 2 values vs 6 Pt accompanied by: self  PERTINENT HISTORY: long history of atrial fibrillation with multiple prior catheter ablations, history of heart failure, dyspnea History of CVA  PAIN:  Are you having pain? NO PRECAUTIONS: None  RED FLAGS: None   WEIGHT BEARING RESTRICTIONS: No  FALLS: Has patient fallen in last 6 months? Yes. Number of falls 1, was moving houses and tripped over a step   LIVING ENVIRONMENT: Lives with: lives with their daughter Lives in: House/apartment Stairs: Yes: Internal: full set but does not go up them steps; on right going up and External: 3 steps; can reach both Has following equipment at home: Vannie - 2 wheeled, Environmental consultant - 4 wheeled, and Wheelchair (manual)  PLOF: Independent, Independent with basic ADLs, Independent with community mobility without device, and Independent with gait  PATIENT GOALS: whatever you can do to make me better physically   OBJECTIVE:  Note: Objective measures were completed at Evaluation unless otherwise noted.  DIAGNOSTIC FINDINGS: N/A  COGNITION: Overall cognitive status: Within functional limits for tasks assessed   SENSATION: Light touch: some numbness in toes and spreads to feet   COORDINATION: Decrease balance and coordination   POSTURE: rounded shoulders  LOWER EXTREMITY ROM:  WFL BLE, some pain with R hip flexion   LOWER EXTREMITY MMT:    MMT Right Eval Left Eval  Hip flexion 3+ 4  Hip extension    Hip abduction 4+ 5  Hip adduction     Hip internal rotation    Hip external rotation    Knee flexion 5 5  Knee extension 4 4+  Ankle dorsiflexion    Ankle plantarflexion    Ankle inversion    Ankle eversion    (Blank rows = not tested)   STAIRS: Findings: Level of Assistance: Modified independence and Stair Negotiation Technique: Step to Pattern with Bilateral Rails GAIT: Findings: Gait Characteristics: narrow BOS, poor foot clearance- Right, and poor foot clearance- Left, unstable especially with turns, unsteady on her feet   FUNCTIONAL TESTS:  5 times sit to stand: 16s Timed up and go (TUG): 11.30s 3 minute walk test: 646ft 97%, 80bpm BERG 45/56                                                                                                                               TREATMENT DATE:   10/15/23 Nustep L 5 O2  98 HR 86 STS 5 x 11.5  450 feet 3 min PRE 9/10, O2 100. HR 73 STS with wt ball chest press 10 x 6 inch step up with opp leg ext 10 xBIL with UE 6 inch lateral step up with opp leg abd 10 x BIL with UE HS Curls 25lb 2x10 Leg Ext 10lb 2x10 Seated  row 20# 2 sets 10 Seated resisted trunk ext 2 sets 10  10/13/23 Bike L 3 O2  98    HR  80 STS with wt ball press 10 x 2 sets Resisted gait 30# 5 x fwd and back and 3 x each side Leg press 30# 2 sets 10 ( seat on 7, tried 6 but too difficult) 6 in step ups with light HHA 10 x BIL HS Curls 25lb 2x10 Leg Ext 10lb 2x10 6# farmer carry 1 lap each hand Stepping over objects CGA     10/07/23 Bike L 2.5 x 6 min  O2 98%  Goals  Sit to stands holding 2lb 2x10 HS Curls 25lb 2x10 Leg Ext 10lb 2x10 6in step ups x10 each  4in lateral step ups x10 Heel Raises black bar 2x10   10/01/23  Nustep L 2 5 min  O2 after 96 Red tband 2 sets 10 HS curl,LAQ,clams,hip abd and hip flexion STS with 3# chest press 2 sets 5, O2 after 95 Red tband 10 x SL hip flex,ext and abd Feet on ball bridge,KTC and obl  10 x each Iso abdominals with ball 10 x hold 3  sec Resisted gait 3 x 4 ways    09/23/23 EVAL    PATIENT EDUCATION: Education details: POC, HEP Person educated: Patient Education method: Medical illustrator Education comprehension: verbalized understanding and returned demonstration  HOME EXERCISE PROGRAM: Access Code: [email or web address] URL: [email or web address] Date: 10/01/2023 Prepared by: Melroy Bougher  Exercises - Standing Hip Flexion with Resistance Loop  - 1 x daily - 7 x weekly - 1 sets - 10 reps - Hip Extension with Resistance Loop  - 1 x daily - 7 x weekly - 1 sets - 10 reps - Hip Abduction with Resistance Loop  - 1 x daily - 7 x weekly - 1 sets - 10 reps - Seated Long Arc Quad  - 1 x daily - 7 x weekly - 1 sets - 10 reps - Seated March  - 1 x daily - 7 x weekly - 1 sets - 10 reps - Seated Hip Abduction with Resistance  - 1 x daily - 7 x weekly - 10 reps  Access Code: [email or web address] URL: [email or web address] Date: 09/23/2023 Prepared by: Almetta Fam  Exercises - Sit to Stand  - 1 x daily - 7 x weekly - 2 sets - 10 reps - Standing Single Leg Stance with Counter Support  - 1 x daily - 7 x weekly - 10 reps - 10 hold - Standing Tandem Balance with Counter Support  - 1 x daily - 7 x weekly - 10 reps - 15 hold  GOALS: Goals reviewed with patient? Yes  SHORT TERM GOALS: Target date: 10/28/23  Patient will be independent with initial HEP. Baseline:  Goal status: 10/01/23 MET  2.  Patient will demonstrate improved functional LE strength as demonstrated by 5xSTS <13s. Baseline: 16s Goal status: 14.60 sec Progressing 10/07/23   MET 10/15/23   LONG TERM GOALS: Target date: 12/02/23  Patient will be independent with advanced/ongoing HEP to improve outcomes and carryover.  Baseline:  Goal status: INITIAL  2.  Patient will be able to ambulate 800' in with less staggering and unsteadiness Baseline: 61ft some SOB and needs SBA Goal status: 10/15/23 450 feet 3 min PRE 9/10  3.   Patient will score 53 on Berg Balance test to demonstrate lower risk of falls. (MCID= 8 points) .  Baseline: 45 Goal status: INITIAL  4.  Patient will able to return to her normal household chores/routine without SOB and fatigue.  Baseline:  Goal status: progressing 10/15/23  5.  Patient will reports decrease in pain in R hip and groin <2/10. Baseline: 7/10 at worst Goal status: ongoing 10/07/23   ASSESSMENT:  CLINICAL IMPRESSION:   busy couple days with MD appts. Changed pacemaker down to 70 vs 80 and wants me to go to cardiac rehab after this heart failure MD says I am operating on 2 values vs 6.450 feet 3 min PRE 9/10, O2 100. HR 73- progressing with goal. STS goal met. Progressing ex for strength and func. SOB noted and seated rest given as needed   Patient is a 73 y.o. female who was seen today for physical therapy evaluation and treatment for generalized weakness. She has an extensive medical history of heart issues, including a-fib and heart failure. She reports being sick in June and everything went downhill since then. Recent AV node ablation was on 09/15/23. Pt reports difficulty with her normal activities due to fatigue, shortness or breath, and weakness. She reports some staggering and wobbling when walking but she also thinks she has some vision problems that contribute to this. She does have a walker that she uses for longer distances. With functional tests completed today, she does present as a fall risk. Therefore, she will benefit from skilled PT to address weakness, endurance, gait, and balance impairments to decrease risk for fall and help her return to PLOF.  OBJECTIVE IMPAIRMENTS: Abnormal gait, cardiopulmonary status limiting activity, decreased activity tolerance, decreased balance, decreased coordination, decreased endurance, decreased strength, increased edema, and pain.   ACTIVITY LIMITATIONS: carrying, squatting, stairs, and locomotion level  PARTICIPATION LIMITATIONS:  meal prep, cleaning, laundry, shopping, community activity, and yard work  PERSONAL FACTORS: Age, Fitness, and 1-2 comorbidities: a-fib, heart failure are also affecting patient's functional outcome.   REHAB POTENTIAL: Good  CLINICAL DECISION MAKING: Stable/uncomplicated  EVALUATION COMPLEXITY: Low  PLAN:  PT FREQUENCY: 2x/week  PT DURATION: 10 weeks  PLANNED INTERVENTIONS: 97110-Therapeutic exercises, 97530- Therapeutic activity, [id number]- Neuromuscular re-education, 97535- Self Care, [id number]- Manual therapy, [phone]- Gait training, Patient/Family education, Balance training, Stair training, Cryotherapy, and Moist heat  PLAN FOR NEXT SESSION: pt to check on cardiac rehab time frame   Aycen Porreca,ANGIE, PTA 10/15/2023, 2:46 PM

## 2023-10-16 ENCOUNTER — Ambulatory Visit: Admitting: Behavioral Health

## 2023-10-16 ENCOUNTER — Encounter: Payer: Self-pay | Admitting: Behavioral Health

## 2023-10-16 DIAGNOSIS — F4323 Adjustment disorder with mixed anxiety and depressed mood: Secondary | ICD-10-CM

## 2023-10-16 NOTE — Progress Notes (Signed)
 [facility] Counselor/Therapist Progress Note  Patient ID: Shannon [name], MRN: [id number],    Date: October 16, 2023  Time Spent: 49 minutes, 3:10pm until 3:59 PM this session was held via video teletherapy. The patient consented to the video teletherapy and was located in her home during this session. She is aware it is the responsibility of the patient to secure confidentiality on her end of the session. The provider was in a private home office for the duration of this session.     Reported Symptoms: Anxiety, depression  Mental Status Exam: Appearance:  Well Groomed     Behavior: Appropriate  Motor: Normal  Speech/Language:  Clear and Coherent  Affect: Appropriate for  Mood: normal  Thought process: normal  Thought content:   WNL  Sensory/Perceptual disturbances:   WNL  Orientation: oriented to person, place, time/date, situation, day of week, month of year, and year  Attention: Good  Concentration: Good  Memory: WNL  Fund of knowledge:  Good  Insight:   Good  Judgment:  Good  Impulse Control: Good   Risk Assessment: Danger to Self:  No Self-injurious Behavior: No Danger to Others: No Duty to Warn:no Physical Aggression / Violence:No  Access to Firearms a concern: No  Gang Involvement:No   Subjective: The patient had several doctors visits this week.  One of the cardiologist felt like things are going well but compared her heart to a 6 cylinder car saying it is running well but it is only running on 2 cylinders and probably will not run much better than that.  She said she likes to know the truth but that was a little discouraging but knows that she continues to make progress through physical therapy, sleeping better, eating better.  Second doctor felt really well about things are going.  They did turn her pacemaker down from 70 bpm until 60 bpm and she has slept better since then.  They also switched her diuretic to 4 in the morning versus 2 in the  morning and 2 at night so she knows that is contributing to improved sleep also.  She continues to wrestle with forgiveness but feels that a little better at a time that is getting better.  She did speak to her daughter about moving the arm with her husband's ashes away from a central place in the middle of the house and they are going to do that.  She found that her son is really struggling with that also and so they had some good conversation about that.  She went with her daughter to her daughter's ultrasound visit afternoon and it seems that the daughter's pregnancy is progressing well.  She does contract for safety having no thoughts of hurting herself or anyone else. Interventions: Cognitive Behavioral Therapy  Diagnosis: Adjustment disorder with mixed anxiety and depressed mood.  Plan: I will meet with the patient every 2 weeks via video session TX. Plan:To use cognitive behavioral therapy principles as well as elements of dialectical behavior therapy.  Goals are to reduce anxiety and depression by at least 50% with a target date of November 06, 2022.  Goals are to have less sadness as indicated by PH-9 scores as well as patient report.  We also work on improving mood and return to a healthier level of functioning as defined by her goals for being happy, identify causes and process triggers for depressed mood.  We will use cognitive behavioral therapy to explore and replace unhealthy thoughts and behavior patterns contributing  to depression.  I will continue to encourage shearing of feelings related to the causes and symptoms of depression as well as teach and encouraged use of coping skills for management of depressive symptoms.  We also will work to improve the patient's ability to manage anxiety symptoms and better handle stress, identify causes for anxiety and explore ways for reduction of anxiety in addition to resolving conflicts contributing to anxiety and managing thoughts and worrisome  thinking is contributing to anxiety.  Interventions will include providing education about anxiety, facilitate problem solution skills, teaching coping skills for managing anxiety such as grounding exercises, progressive muscle relaxation and cognitive re framing etc.  We will also use cognitive behavioral therapy to identify and change anxiety provoking thoughts and behavior patterns as well as using DBT distress tolerance and mindfulness skills. I reviewed the treatment plan goals with the patient who agreed to continue with goals as stated above. Progress: 35%New target date will be October 31st, 2025. Lorrene CHRISTELLA Hasten, [facility]                                                Lorrene CHRISTELLA Hasten, [facility]               Lorrene CHRISTELLA Hasten, [facility]               Lorrene CHRISTELLA Hasten, [facility]               Lorrene CHRISTELLA Hasten, [facility]               Lorrene CHRISTELLA Hasten, [facility]               Lorrene CHRISTELLA Hasten, [facility]               Lorrene CHRISTELLA Hasten, [facility]               Lorrene CHRISTELLA Hasten, [facility]               Lorrene CHRISTELLA Hasten, [facility]               Lorrene CHRISTELLA Hasten, [facility]               Lorrene CHRISTELLA Hasten, [facility]               Lorrene CHRISTELLA Hasten, [facility]               Lorrene CHRISTELLA Hasten, [facility]               Lorrene CHRISTELLA Hasten, [facility]               Lorrene CHRISTELLA Hasten, [facility]               Lorrene CHRISTELLA Hasten, [facility]               Lorrene CHRISTELLA Hasten, [facility]               Lorrene CHRISTELLA Hasten, [facility]               Lorrene CHRISTELLA Hasten, [facility]

## 2023-10-19 ENCOUNTER — Ambulatory Visit: Payer: Self-pay | Admitting: Cardiology

## 2023-10-19 ENCOUNTER — Ambulatory Visit

## 2023-10-19 NOTE — Therapy (Incomplete)
 OUTPATIENT PHYSICAL THERAPY NEURO   Patient Name: Shannon [name] MRN: [id number] DOB:January 01, 1951, 73 y.o., female Today's Date: 10/19/2023   PCP: Thersia Stark REFERRING PROVIDER: Toribio Furnace  END OF SESSION:    Past Medical History:  Diagnosis Date   Acute systolic heart failure (HCC) 07/08/2023   Allergy    Anemia    Anxiety    Arthritis 1990's   neck and lower back (03/25/2016)   Asthma 1990s X 1   short term inhaler use    CAD (coronary artery disease)    Cardiac pacemaker in situ    MDT   CHF (congestive heart failure) (HCC)    Chronic lower back pain    COVID-19 03/06/2022   Degenerative disorder of bone    Depression    Diastolic dysfunction    Drug-induced lupus erythematosus    HCTZ induced; still gettin over it (03/25/2016)   Foot swelling 05/19/2022   GERD (gastroesophageal reflux disease) 1990's   Headache, unspecified headache type 03/06/2022   Herniated disc, cervical    Hospital discharge follow-up 03/14/2022   Hyperlipidemia    Hypertension    Neuromuscular disorder (HCC)    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 HTN   Obesity (BMI 30-39.9) 11/07/2022   Orthostatic hypotension    OSA on CPAP    Osteopenia    Osteoporosis 2012   PAF (paroxysmal atrial fibrillation) (HCC)    Paroxysmal atrial fibrillation (HCC) 12/13/2021   Phlebitis after infusion 03/18/2023   Pinched nerve in neck    PND (post-nasal drip) 12/18/2022   Rapid atrial fibrillation (HCC) 07/06/2023   Sinus node dysfunction (HCC)    Sinus pressure 12/18/2022   Sleep apnea 1990's   wears CPAP   Stroke (HCC)    T12 compression fracture (HCC) 11/2015   Vitamin D  deficiency    Whiplash injury 06/07/2010   Past Surgical History:  Procedure Laterality Date   APPENDECTOMY  1990s   ATRIAL FIBRILLATION ABLATION N/A 03/25/2016   Procedure: Atrial Fibrillation Ablation;  Surgeon: Lynwood Rakers, MD;  Location: [facility] INVASIVE CV LAB;  Service: Cardiovascular;   Laterality: N/A;   ATRIAL FIBRILLATION ABLATION N/A 01/31/2020   Procedure: ATRIAL FIBRILLATION ABLATION;  Surgeon: Rakers Lynwood, MD;  Location: [facility] INVASIVE CV LAB;  Service: Cardiovascular;  Laterality: N/A;   ATRIAL FIBRILLATION ABLATION N/A 12/13/2021   Procedure: ATRIAL FIBRILLATION ABLATION;  Surgeon: [name], MD;  Location: [facility] INVASIVE CV LAB;  Service: Cardiovascular;  Laterality: N/A;   AV NODE ABLATION N/A 09/15/2023   Procedure: AV NODE ABLATION;  Surgeon: [name], MD;  Location: [facility] INVASIVE CV LAB;  Service: Cardiovascular;  Laterality: N/A;   CARDIOVERSION N/A 07/09/2023   Procedure: CARDIOVERSION;  Surgeon: Okey Vina GAILS, MD;  Location: [facility] INVASIVE CV LAB;  Service: Cardiovascular;  Laterality: N/A;   COLONOSCOPY     FOREARM FRACTURE SURGERY Left ~ 02/2011   broke arm; shattered wrist   FOREARM HARDWARE REMOVAL Left ~ 07/2011   implantable loop recorder placement  03/07/2019   Medtronic Reveal Linq model LNQ 22 implantable loop recorder ([id number]) implanted by Dr Rakers for Afib management   INSERT / REPLACE / REMOVE PACEMAKER  2025   LOOP RECORDER REMOVAL N/A 03/09/2023   Procedure: LOOP RECORDER REMOVAL;  Surgeon: Kennyth Chew, MD;  Location: [facility] INVASIVE CV LAB;  Service: Cardiovascular;  Laterality: N/A;   PACEMAKER IMPLANT N/A 03/09/2023   Procedure: PACEMAKER IMPLANT;  Surgeon: Kennyth Chew, MD;  Location: [facility] INVASIVE CV LAB;  Service: Cardiovascular;  Laterality: N/A;   Spinal Nerve Ablation     TEE WITHOUT CARDIOVERSION N/A 03/24/2016   Procedure: TRANSESOPHAGEAL ECHOCARDIOGRAM (TEE);  Surgeon: Jerel Balding, MD;  Location: [facility] ENDOSCOPY;  Service: Cardiovascular;  Laterality: N/A;   Patient Active Problem List   Diagnosis Date Noted   Hypertensive urgency 08/28/2023   Hypokalemia 08/28/2023   Acute on chronic systolic CHF (congestive heart failure) (HCC) 08/27/2023   Chronic obstructive pulmonary disease with acute exacerbation (HCC)  07/14/2023   Chronic HFrEF (heart failure with reduced ejection fraction) (HCC) 07/06/2023   Atrial fibrillation with RVR (HCC) 03/07/2023   History of tachycardia-bradycardia syndrome (HCC) 03/07/2023   Demand ischemia (HCC) 03/07/2023   Current severe episode of major depressive disorder without psychotic features without prior episode (HCC) 03/06/2022   GAD (generalized anxiety disorder) 03/06/2022   Caregiver role strain 03/06/2022   Adjustment disorder with mixed anxiety and depressed mood 03/03/2022   Diplopia 01/15/2022   History of CVA (cerebrovascular accident) 01/15/2022   Thoracic spine pain 11/22/2021   Tinnitus of both ears 11/22/2021   Bilateral lower extremity edema 11/22/2021   Acute CVA (cerebrovascular accident) (HCC) 02/06/2021   Dyslipidemia, goal LDL below 130 07/10/2020   Iron  deficiency anemia 07/10/2020   Shortness of breath 12/23/2019   Asthma 12/23/2019   Morbid obesity (HCC) 09/11/2017   Chronic fatigue 05/27/2016   A-fib (HCC) 03/25/2016   Typical atrial flutter (HCC)    Chronic diastolic CHF (congestive heart failure) (HCC) 12/08/2015   Atrial fibrillation with rapid ventricular response (HCC) 12/08/2015   Thoracic compression fracture (HCC) 11/16/2015   Orthostatic hypotension 11/15/2015   Osteopenia 09/12/2015   Vitamin D  deficiency disease 08/29/2015   Polymyalgia rheumatica 04/18/2015   OSA (obstructive sleep apnea) 03/04/2015   Myalgia 11/22/2014   Primary hypertension 08/21/2014   Anxiety and depression 08/21/2014    ONSET DATE: 08/31/23  REFERRING DIAG:  Mobility status PT Visit Diagnosis: Unsteadiness on feet (R26.81);Muscle weakness (generalized) (M62.81)      THERAPY DIAG:  No diagnosis found.  Rationale for Evaluation and Treatment: Rehabilitation  SUBJECTIVE:                                                                                                                                                                                              SUBJECTIVE STATEMENT: busy couple days with MD appts. Changed pacemaker down to 70 vs 80 and wants me to go to cardiac rehab after this heart failure MD says I am operating on 2 values vs 6 Pt accompanied by: self  PERTINENT HISTORY: long history of atrial fibrillation with multiple prior catheter  ablations, history of heart failure, dyspnea History of CVA  PAIN:  Are you having pain? NO PRECAUTIONS: None  RED FLAGS: None   WEIGHT BEARING RESTRICTIONS: No  FALLS: Has patient fallen in last 6 months? Yes. Number of falls 1, was moving houses and tripped over a step   LIVING ENVIRONMENT: Lives with: lives with their daughter Lives in: House/apartment Stairs: Yes: Internal: full set but does not go up them steps; on right going up and External: 3 steps; can reach both Has following equipment at home: Vannie - 2 wheeled, Environmental consultant - 4 wheeled, and Wheelchair (manual)  PLOF: Independent, Independent with basic ADLs, Independent with community mobility without device, and Independent with gait  PATIENT GOALS: whatever you can do to make me better physically   OBJECTIVE:  Note: Objective measures were completed at Evaluation unless otherwise noted.  DIAGNOSTIC FINDINGS: N/A  COGNITION: Overall cognitive status: Within functional limits for tasks assessed   SENSATION: Light touch: some numbness in toes and spreads to feet   COORDINATION: Decrease balance and coordination   POSTURE: rounded shoulders  LOWER EXTREMITY ROM:  WFL BLE, some pain with R hip flexion   LOWER EXTREMITY MMT:    MMT Right Eval Left Eval  Hip flexion 3+ 4  Hip extension    Hip abduction 4+ 5  Hip adduction    Hip internal rotation    Hip external rotation    Knee flexion 5 5  Knee extension 4 4+  Ankle dorsiflexion    Ankle plantarflexion    Ankle inversion    Ankle eversion    (Blank rows = not tested)   STAIRS: Findings: Level of Assistance: Modified independence and  Stair Negotiation Technique: Step to Pattern with Bilateral Rails GAIT: Findings: Gait Characteristics: narrow BOS, poor foot clearance- Right, and poor foot clearance- Left, unstable especially with turns, unsteady on her feet   FUNCTIONAL TESTS:  5 times sit to stand: 16s Timed up and go (TUG): 11.30s 3 minute walk test: 685ft 97%, 80bpm BERG 45/56                                                                                                                               TREATMENT DATE:  10/19/23 NuStep Walking laps holding 3# weights  STS holding yellow ball Leg ext HS curls  Step ups  Walking on beam   10/15/23 Nustep L 5 O2  98 HR 86 STS 5 x 11.5  450 feet 3 min PRE 9/10, O2 100. HR 73 STS with wt ball chest press 10 x 6 inch step up with opp leg ext 10 xBIL with UE 6 inch lateral step up with opp leg abd 10 x BIL with UE HS Curls 25lb 2x10 Leg Ext 10lb 2x10 Seated row 20# 2 sets 10 Seated resisted trunk ext 2 sets 10  10/13/23 Bike L 3 O2  98    HR  80 STS with  wt ball press 10 x 2 sets Resisted gait 30# 5 x fwd and back and 3 x each side Leg press 30# 2 sets 10 ( seat on 7, tried 6 but too difficult) 6 in step ups with light HHA 10 x BIL HS Curls 25lb 2x10 Leg Ext 10lb 2x10 6# farmer carry 1 lap each hand Stepping over objects CGA     10/07/23 Bike L 2.5 x 6 min  O2 98%  Goals  Sit to stands holding 2lb 2x10 HS Curls 25lb 2x10 Leg Ext 10lb 2x10 6in step ups x10 each  4in lateral step ups x10 Heel Raises black bar 2x10   10/01/23  Nustep L 2 5 min  O2 after 96 Red tband 2 sets 10 HS curl,LAQ,clams,hip abd and hip flexion STS with 3# chest press 2 sets 5, O2 after 95 Red tband 10 x SL hip flex,ext and abd Feet on ball bridge,KTC and obl  10 x each Iso abdominals with ball 10 x hold 3 sec Resisted gait 3 x 4 ways    09/23/23 EVAL    PATIENT EDUCATION: Education details: POC, HEP Person educated: Patient Education method:  Medical illustrator Education comprehension: verbalized understanding and returned demonstration  HOME EXERCISE PROGRAM: Access Code: [email or web address] URL: [email or web address] Date: 10/01/2023 Prepared by: Angela Payseur  Exercises - Standing Hip Flexion with Resistance Loop  - 1 x daily - 7 x weekly - 1 sets - 10 reps - Hip Extension with Resistance Loop  - 1 x daily - 7 x weekly - 1 sets - 10 reps - Hip Abduction with Resistance Loop  - 1 x daily - 7 x weekly - 1 sets - 10 reps - Seated Long Arc Quad  - 1 x daily - 7 x weekly - 1 sets - 10 reps - Seated March  - 1 x daily - 7 x weekly - 1 sets - 10 reps - Seated Hip Abduction with Resistance  - 1 x daily - 7 x weekly - 10 reps  Access Code: [email or web address] URL: [email or web address] Date: 09/23/2023 Prepared by: Almetta Fam  Exercises - Sit to Stand  - 1 x daily - 7 x weekly - 2 sets - 10 reps - Standing Single Leg Stance with Counter Support  - 1 x daily - 7 x weekly - 10 reps - 10 hold - Standing Tandem Balance with Counter Support  - 1 x daily - 7 x weekly - 10 reps - 15 hold  GOALS: Goals reviewed with patient? Yes  SHORT TERM GOALS: Target date: 10/28/23  Patient will be independent with initial HEP. Baseline:  Goal status: 10/01/23 MET  2.  Patient will demonstrate improved functional LE strength as demonstrated by 5xSTS <13s. Baseline: 16s Goal status: 14.60 sec Progressing 10/07/23   MET 10/15/23   LONG TERM GOALS: Target date: 12/02/23  Patient will be independent with advanced/ongoing HEP to improve outcomes and carryover.  Baseline:  Goal status: INITIAL  2.  Patient will be able to ambulate 800' in with less staggering and unsteadiness Baseline: 662ft some SOB and needs SBA Goal status: 10/15/23 450 feet 3 min PRE 9/10  3.  Patient will score 53 on Berg Balance test to demonstrate lower risk of falls. (MCID= 8 points) .  Baseline: 45 Goal status: INITIAL  4.  Patient will  able to return to her normal household chores/routine without SOB and fatigue.  Baseline:  Goal status: progressing 10/15/23  5.  Patient will reports decrease in  pain in R hip and groin <2/10. Baseline: 7/10 at worst Goal status: ongoing 10/07/23   ASSESSMENT:  CLINICAL IMPRESSION:   busy couple days with MD appts. Changed pacemaker down to 70 vs 80 and wants me to go to cardiac rehab after this heart failure MD says I am operating on 2 values vs 6.450 feet 3 min PRE 9/10, O2 100. HR 73- progressing with goal. STS goal met. Progressing ex for strength and func. SOB noted and seated rest given as needed   Patient is a 73 y.o. female who was seen today for physical therapy evaluation and treatment for generalized weakness. She has an extensive medical history of heart issues, including a-fib and heart failure. She reports being sick in June and everything went downhill since then. Recent AV node ablation was on 09/15/23. Pt reports difficulty with her normal activities due to fatigue, shortness or breath, and weakness. She reports some staggering and wobbling when walking but she also thinks she has some vision problems that contribute to this. She does have a walker that she uses for longer distances. With functional tests completed today, she does present as a fall risk. Therefore, she will benefit from skilled PT to address weakness, endurance, gait, and balance impairments to decrease risk for fall and help her return to PLOF.  OBJECTIVE IMPAIRMENTS: Abnormal gait, cardiopulmonary status limiting activity, decreased activity tolerance, decreased balance, decreased coordination, decreased endurance, decreased strength, increased edema, and pain.   ACTIVITY LIMITATIONS: carrying, squatting, stairs, and locomotion level  PARTICIPATION LIMITATIONS: meal prep, cleaning, laundry, shopping, community activity, and yard work  PERSONAL FACTORS: Age, Fitness, and 1-2 comorbidities: a-fib, heart failure  are also affecting patient's functional outcome.   REHAB POTENTIAL: Good  CLINICAL DECISION MAKING: Stable/uncomplicated  EVALUATION COMPLEXITY: Low  PLAN:  PT FREQUENCY: 2x/week  PT DURATION: 10 weeks  PLANNED INTERVENTIONS: 97110-Therapeutic exercises, 97530- Therapeutic activity, 97112- Neuromuscular re-education, 97535- Self Care, [id number]- Manual therapy, [phone]- Gait training, Patient/Family education, Balance training, Stair training, Cryotherapy, and Moist heat  PLAN FOR NEXT SESSION: pt to check on cardiac rehab time frame   [facility], PT 10/19/2023, 8:01 AM

## 2023-10-20 ENCOUNTER — Other Ambulatory Visit (HOSPITAL_BASED_OUTPATIENT_CLINIC_OR_DEPARTMENT_OTHER): Payer: Self-pay

## 2023-10-20 ENCOUNTER — Ambulatory Visit

## 2023-10-20 ENCOUNTER — Ambulatory Visit: Admitting: Physician Assistant

## 2023-10-20 ENCOUNTER — Ambulatory Visit (INDEPENDENT_AMBULATORY_CARE_PROVIDER_SITE_OTHER): Admitting: Family Medicine

## 2023-10-20 VITALS — BP 126/63 | HR 80 | Temp 98.3°F | Ht 67.0 in | Wt 186.0 lb

## 2023-10-20 DIAGNOSIS — M79604 Pain in right leg: Secondary | ICD-10-CM | POA: Diagnosis not present

## 2023-10-20 DIAGNOSIS — I4819 Other persistent atrial fibrillation: Secondary | ICD-10-CM

## 2023-10-20 DIAGNOSIS — J069 Acute upper respiratory infection, unspecified: Secondary | ICD-10-CM | POA: Diagnosis not present

## 2023-10-20 LAB — POCT INFLUENZA A/B
Influenza A, POC: NEGATIVE
Influenza B, POC: NEGATIVE

## 2023-10-20 LAB — POC COVID19 BINAXNOW: SARS Coronavirus 2 Ag: NEGATIVE

## 2023-10-20 MED ORDER — DOXYCYCLINE HYCLATE 100 MG PO TABS
100.0000 mg | ORAL_TABLET | Freq: Two times a day (BID) | ORAL | 0 refills | Status: DC
  Filled 2023-10-20: qty 14, 7d supply, fill #0

## 2023-10-20 NOTE — Therapy (Incomplete)
 OUTPATIENT PHYSICAL THERAPY NEURO   Patient Name: Shannon [name] MRN: [id number] DOB:August 24, 1950, 73 y.o., female Today's Date: 10/20/2023   PCP: Thersia Stark REFERRING PROVIDER: Toribio Furnace  END OF SESSION:    Past Medical History:  Diagnosis Date   Acute systolic heart failure (HCC) 07/08/2023   Allergy    Anemia    Anxiety    Arthritis 1990's   neck and lower back (03/25/2016)   Asthma 1990s X 1   short term inhaler use    CAD (coronary artery disease)    Cardiac pacemaker in situ    MDT   CHF (congestive heart failure) (HCC)    Chronic lower back pain    COVID-19 03/06/2022   Degenerative disorder of bone    Depression    Diastolic dysfunction    Drug-induced lupus erythematosus    HCTZ induced; still gettin over it (03/25/2016)   Foot swelling 05/19/2022   GERD (gastroesophageal reflux disease) 1990's   Headache, unspecified headache type 03/06/2022   Herniated disc, cervical    Hospital discharge follow-up 03/14/2022   Hyperlipidemia    Hypertension    Neuromuscular disorder (HCC)    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 HTN   Obesity (BMI 30-39.9) 11/07/2022   Orthostatic hypotension    OSA on CPAP    Osteopenia    Osteoporosis 2012   PAF (paroxysmal atrial fibrillation) (HCC)    Paroxysmal atrial fibrillation (HCC) 12/13/2021   Phlebitis after infusion 03/18/2023   Pinched nerve in neck    PND (post-nasal drip) 12/18/2022   Rapid atrial fibrillation (HCC) 07/06/2023   Sinus node dysfunction (HCC)    Sinus pressure 12/18/2022   Sleep apnea 1990's   wears CPAP   Stroke (HCC)    T12 compression fracture (HCC) 11/2015   Vitamin D  deficiency    Whiplash injury 06/07/2010   Past Surgical History:  Procedure Laterality Date   APPENDECTOMY  1990s   ATRIAL FIBRILLATION ABLATION N/A 03/25/2016   Procedure: Atrial Fibrillation Ablation;  Surgeon: Lynwood Rakers, MD;  Location: [facility] INVASIVE CV LAB;  Service: Cardiovascular;   Laterality: N/A;   ATRIAL FIBRILLATION ABLATION N/A 01/31/2020   Procedure: ATRIAL FIBRILLATION ABLATION;  Surgeon: Rakers Lynwood, MD;  Location: [facility] INVASIVE CV LAB;  Service: Cardiovascular;  Laterality: N/A;   ATRIAL FIBRILLATION ABLATION N/A 12/13/2021   Procedure: ATRIAL FIBRILLATION ABLATION;  Surgeon: [name], MD;  Location: [facility] INVASIVE CV LAB;  Service: Cardiovascular;  Laterality: N/A;   AV NODE ABLATION N/A 09/15/2023   Procedure: AV NODE ABLATION;  Surgeon: [name], MD;  Location: [facility] INVASIVE CV LAB;  Service: Cardiovascular;  Laterality: N/A;   CARDIOVERSION N/A 07/09/2023   Procedure: CARDIOVERSION;  Surgeon: Okey Vina GAILS, MD;  Location: [facility] INVASIVE CV LAB;  Service: Cardiovascular;  Laterality: N/A;   COLONOSCOPY     FOREARM FRACTURE SURGERY Left ~ 02/2011   broke arm; shattered wrist   FOREARM HARDWARE REMOVAL Left ~ 07/2011   implantable loop recorder placement  03/07/2019   Medtronic Reveal Linq model LNQ 22 implantable loop recorder ([id number]) implanted by Dr Rakers for Afib management   INSERT / REPLACE / REMOVE PACEMAKER  2025   LOOP RECORDER REMOVAL N/A 03/09/2023   Procedure: LOOP RECORDER REMOVAL;  Surgeon: Kennyth Chew, MD;  Location: [facility] INVASIVE CV LAB;  Service: Cardiovascular;  Laterality: N/A;   PACEMAKER IMPLANT N/A 03/09/2023   Procedure: PACEMAKER IMPLANT;  Surgeon: Kennyth Chew, MD;  Location[facility] INVASIVE CV LAB;  Service: Cardiovascular;  Laterality: N/A;   Spinal Nerve Ablation     TEE WITHOUT CARDIOVERSION N/A 03/24/2016   Procedure: TRANSESOPHAGEAL ECHOCARDIOGRAM (TEE);  Surgeon: Jerel Balding, MD;  Location: [facility] ENDOSCOPY;  Service: Cardiovascular;  Laterality: N/A;   Patient Active Problem List   Diagnosis Date Noted   Hypertensive urgency 08/28/2023   Hypokalemia 08/28/2023   Acute on chronic systolic CHF (congestive heart failure) (HCC) 08/27/2023   Chronic obstructive pulmonary disease with acute exacerbation (HCC)  07/14/2023   Chronic HFrEF (heart failure with reduced ejection fraction) (HCC) 07/06/2023   Atrial fibrillation with RVR (HCC) 03/07/2023   History of tachycardia-bradycardia syndrome (HCC) 03/07/2023   Demand ischemia (HCC) 03/07/2023   Current severe episode of major depressive disorder without psychotic features without prior episode (HCC) 03/06/2022   GAD (generalized anxiety disorder) 03/06/2022   Caregiver role strain 03/06/2022   Adjustment disorder with mixed anxiety and depressed mood 03/03/2022   Diplopia 01/15/2022   History of CVA (cerebrovascular accident) 01/15/2022   Thoracic spine pain 11/22/2021   Tinnitus of both ears 11/22/2021   Bilateral lower extremity edema 11/22/2021   Acute CVA (cerebrovascular accident) (HCC) 02/06/2021   Dyslipidemia, goal LDL below 130 07/10/2020   Iron  deficiency anemia 07/10/2020   Shortness of breath 12/23/2019   Asthma 12/23/2019   Morbid obesity (HCC) 09/11/2017   Chronic fatigue 05/27/2016   A-fib (HCC) 03/25/2016   Typical atrial flutter (HCC)    Chronic diastolic CHF (congestive heart failure) (HCC) 12/08/2015   Atrial fibrillation with rapid ventricular response (HCC) 12/08/2015   Thoracic compression fracture (HCC) 11/16/2015   Orthostatic hypotension 11/15/2015   Osteopenia 09/12/2015   Vitamin D  deficiency disease 08/29/2015   Polymyalgia rheumatica 04/18/2015   OSA (obstructive sleep apnea) 03/04/2015   Myalgia 11/22/2014   Primary hypertension 08/21/2014   Anxiety and depression 08/21/2014    ONSET DATE: 08/31/23  REFERRING DIAG:  Mobility status PT Visit Diagnosis: Unsteadiness on feet (R26.81);Muscle weakness (generalized) (M62.81)      THERAPY DIAG:  No diagnosis found.  Rationale for Evaluation and Treatment: Rehabilitation  SUBJECTIVE:                                                                                                                                                                                              SUBJECTIVE STATEMENT: busy couple days with MD appts. Changed pacemaker down to 70 vs 80 and wants me to go to cardiac rehab after this heart failure MD says I am operating on 2 values vs 6 Pt accompanied by: self  PERTINENT HISTORY: long history of atrial fibrillation with multiple prior catheter  ablations, history of heart failure, dyspnea History of CVA  PAIN:  Are you having pain? NO PRECAUTIONS: None  RED FLAGS: None   WEIGHT BEARING RESTRICTIONS: No  FALLS: Has patient fallen in last 6 months? Yes. Number of falls 1, was moving houses and tripped over a step   LIVING ENVIRONMENT: Lives with: lives with their daughter Lives in: House/apartment Stairs: Yes: Internal: full set but does not go up them steps; on right going up and External: 3 steps; can reach both Has following equipment at home: Vannie - 2 wheeled, Environmental consultant - 4 wheeled, and Wheelchair (manual)  PLOF: Independent, Independent with basic ADLs, Independent with community mobility without device, and Independent with gait  PATIENT GOALS: whatever you can do to make me better physically   OBJECTIVE:  Note: Objective measures were completed at Evaluation unless otherwise noted.  DIAGNOSTIC FINDINGS: N/A  COGNITION: Overall cognitive status: Within functional limits for tasks assessed   SENSATION: Light touch: some numbness in toes and spreads to feet   COORDINATION: Decrease balance and coordination   POSTURE: rounded shoulders  LOWER EXTREMITY ROM:  WFL BLE, some pain with R hip flexion   LOWER EXTREMITY MMT:    MMT Right Eval Left Eval  Hip flexion 3+ 4  Hip extension    Hip abduction 4+ 5  Hip adduction    Hip internal rotation    Hip external rotation    Knee flexion 5 5  Knee extension 4 4+  Ankle dorsiflexion    Ankle plantarflexion    Ankle inversion    Ankle eversion    (Blank rows = not tested)   STAIRS: Findings: Level of Assistance: Modified independence and  Stair Negotiation Technique: Step to Pattern with Bilateral Rails GAIT: Findings: Gait Characteristics: narrow BOS, poor foot clearance- Right, and poor foot clearance- Left, unstable especially with turns, unsteady on her feet   FUNCTIONAL TESTS:  5 times sit to stand: 16s Timed up and go (TUG): 11.30s 3 minute walk test: 680ft 97%, 80bpm BERG 45/56                                                                                                                               TREATMENT DATE:  10/21/23 NuStep Walking laps holding 3# weights  STS holding yellow ball Leg ext HS curls  Step ups  Walking on beam   10/15/23 Nustep L 5 O2  98 HR 86 STS 5 x 11.5  450 feet 3 min PRE 9/10, O2 100. HR 73 STS with wt ball chest press 10 x 6 inch step up with opp leg ext 10 xBIL with UE 6 inch lateral step up with opp leg abd 10 x BIL with UE HS Curls 25lb 2x10 Leg Ext 10lb 2x10 Seated row 20# 2 sets 10 Seated resisted trunk ext 2 sets 10  10/13/23 Bike L 3 O2  98    HR  80 STS with  wt ball press 10 x 2 sets Resisted gait 30# 5 x fwd and back and 3 x each side Leg press 30# 2 sets 10 ( seat on 7, tried 6 but too difficult) 6 in step ups with light HHA 10 x BIL HS Curls 25lb 2x10 Leg Ext 10lb 2x10 6# farmer carry 1 lap each hand Stepping over objects CGA     10/07/23 Bike L 2.5 x 6 min  O2 98%  Goals  Sit to stands holding 2lb 2x10 HS Curls 25lb 2x10 Leg Ext 10lb 2x10 6in step ups x10 each  4in lateral step ups x10 Heel Raises black bar 2x10   10/01/23  Nustep L 2 5 min  O2 after 96 Red tband 2 sets 10 HS curl,LAQ,clams,hip abd and hip flexion STS with 3# chest press 2 sets 5, O2 after 95 Red tband 10 x SL hip flex,ext and abd Feet on ball bridge,KTC and obl  10 x each Iso abdominals with ball 10 x hold 3 sec Resisted gait 3 x 4 ways    09/23/23 EVAL    PATIENT EDUCATION: Education details: POC, HEP Person educated: Patient Education method:  Medical illustrator Education comprehension: verbalized understanding and returned demonstration  HOME EXERCISE PROGRAM: Access Code: [email or web address] URL: [email or web address] Date: 10/01/2023 Prepared by: Angela Payseur  Exercises - Standing Hip Flexion with Resistance Loop  - 1 x daily - 7 x weekly - 1 sets - 10 reps - Hip Extension with Resistance Loop  - 1 x daily - 7 x weekly - 1 sets - 10 reps - Hip Abduction with Resistance Loop  - 1 x daily - 7 x weekly - 1 sets - 10 reps - Seated Long Arc Quad  - 1 x daily - 7 x weekly - 1 sets - 10 reps - Seated March  - 1 x daily - 7 x weekly - 1 sets - 10 reps - Seated Hip Abduction with Resistance  - 1 x daily - 7 x weekly - 10 reps  Access Code: [email or web address] URL: [email or web address] Date: 09/23/2023 Prepared by: Almetta Fam  Exercises - Sit to Stand  - 1 x daily - 7 x weekly - 2 sets - 10 reps - Standing Single Leg Stance with Counter Support  - 1 x daily - 7 x weekly - 10 reps - 10 hold - Standing Tandem Balance with Counter Support  - 1 x daily - 7 x weekly - 10 reps - 15 hold  GOALS: Goals reviewed with patient? Yes  SHORT TERM GOALS: Target date: 10/28/23  Patient will be independent with initial HEP. Baseline:  Goal status: 10/01/23 MET  2.  Patient will demonstrate improved functional LE strength as demonstrated by 5xSTS <13s. Baseline: 16s Goal status: 14.60 sec Progressing 10/07/23   MET 10/15/23   LONG TERM GOALS: Target date: 12/02/23  Patient will be independent with advanced/ongoing HEP to improve outcomes and carryover.  Baseline:  Goal status: INITIAL  2.  Patient will be able to ambulate 800' in with less staggering and unsteadiness Baseline: 657ft some SOB and needs SBA Goal status: 10/15/23 450 feet 3 min PRE 9/10  3.  Patient will score 53 on Berg Balance test to demonstrate lower risk of falls. (MCID= 8 points) .  Baseline: 45 Goal status: INITIAL  4.  Patient will  able to return to her normal household chores/routine without SOB and fatigue.  Baseline:  Goal status: progressing 10/15/23  5.  Patient will reports decrease in  pain in R hip and groin <2/10. Baseline: 7/10 at worst Goal status: ongoing 10/07/23   ASSESSMENT:  CLINICAL IMPRESSION:   busy couple days with MD appts. Changed pacemaker down to 70 vs 80 and wants me to go to cardiac rehab after this heart failure MD says I am operating on 2 values vs 6.450 feet 3 min PRE 9/10, O2 100. HR 73- progressing with goal. STS goal met. Progressing ex for strength and func. SOB noted and seated rest given as needed   Patient is a 73 y.o. female who was seen today for physical therapy evaluation and treatment for generalized weakness. She has an extensive medical history of heart issues, including a-fib and heart failure. She reports being sick in June and everything went downhill since then. Recent AV node ablation was on 09/15/23. Pt reports difficulty with her normal activities due to fatigue, shortness or breath, and weakness. She reports some staggering and wobbling when walking but she also thinks she has some vision problems that contribute to this. She does have a walker that she uses for longer distances. With functional tests completed today, she does present as a fall risk. Therefore, she will benefit from skilled PT to address weakness, endurance, gait, and balance impairments to decrease risk for fall and help her return to PLOF.  OBJECTIVE IMPAIRMENTS: Abnormal gait, cardiopulmonary status limiting activity, decreased activity tolerance, decreased balance, decreased coordination, decreased endurance, decreased strength, increased edema, and pain.   ACTIVITY LIMITATIONS: carrying, squatting, stairs, and locomotion level  PARTICIPATION LIMITATIONS: meal prep, cleaning, laundry, shopping, community activity, and yard work  PERSONAL FACTORS: Age, Fitness, and 1-2 comorbidities: a-fib, heart failure  are also affecting patient's functional outcome.   REHAB POTENTIAL: Good  CLINICAL DECISION MAKING: Stable/uncomplicated  EVALUATION COMPLEXITY: Low  PLAN:  PT FREQUENCY: 2x/week  PT DURATION: 10 weeks  PLANNED INTERVENTIONS: 97110-Therapeutic exercises, 97530- Therapeutic activity, 97112- Neuromuscular re-education, 97535- Self Care, [id number]- Manual therapy, [phone]- Gait training, Patient/Family education, Balance training, Stair training, Cryotherapy, and Moist heat  PLAN FOR NEXT SESSION: pt to check on cardiac rehab time frame   [facility], PT 10/20/2023, 11:29 AM

## 2023-10-20 NOTE — Patient Instructions (Signed)
  Rest, drink plenty of fluids.  Tylenol for fever, body aches.   For cough: Take Mucinex DM or Robitussin-DM OTC.  Follow the instructions in the box.  If you have high Blood pressure , use Coricidin HBP for cough. You can use plain (not DM) Mucinex for congestion.   For nasal congestion: -Use over-the-counter Flonase: 2 nasal sprays on each side of the nose in the morning until you feel better  -Use OTC Astepro 2 nasal sprays on each side of the nose twice daily until better  Avoid decongestants such as  Pseudoephedrine or phenylephrine   Take the antibiotic as prescribed    Call if not gradually better over the next  10 days  Call anytime if the symptoms are severe, you have high fever, short of breath, chest pain

## 2023-10-20 NOTE — Progress Notes (Unsigned)
 Acute Care Office Visit  Subjective:   Shannon [name] [birth year] 10/20/2023  Chief Complaint  Patient presents with   Cough    Pt began having problems which was originally a tickle in throat about 4 days ago. States about 2 days ago cough became worse. Is coughing up some phlegm that is mainly clear in color. Will become SOB with ambulation. Denies any complaints of fever. Cough has woken her up at night.    HPI: Discussed the use of AI scribe software for clinical note transcription with the patient, who gave verbal consent to proceed.  History of Present Illness Mithra Spano is a 73 year old female with congestive heart failure who presents with shortness of breath and cough.  She was diagnosed with congestive heart failure last month. She began experiencing shortness of breath on Thursday or Friday and considered increasing her Lasix  dosage but decided against it without consulting a doctor. Her body weight has not changed, and she does not feel like she is retaining fluid, which is different from her usual symptoms of fluid retention.  She has been experiencing a cough and sore throat, which worsened last night, waking her up. She has a cough and sore throat, and her daughter and grandchild have had similar symptoms. She has nasal congestion and clear nasal drainage, with some sinus pressure but no significant pain. She used a Mucinex  cough drop and her inhaler, which provided some relief. No fever is present, and she reports a stuffy feeling in her ears but no significant ear pain.  She underwent an ablation about a month ago and has been experiencing shooting pain from the catheter insertion site down her leg, described as a 'lightning bolt' sensation. The pain is intermittent, worsens after sleeping, and is relieved somewhat by Tylenol . She is concerned about the duration of this pain, which has persisted for over a month.  She has a history of a viral  illness over the summer, which she struggled to recover from, and is concerned about her heart's ability to handle another illness. She has had multiple doctor appointments recently and had to cancel some due to her current symptoms.     The following portions of the patient's history were reviewed and updated as appropriate: past medical history, past surgical history, family history, social history, allergies, medications, and problem list.   Patient Active Problem List   Diagnosis Date Noted   Hypertensive urgency 08/28/2023   Hypokalemia 08/28/2023   Acute on chronic systolic CHF (congestive heart failure) (HCC) 08/27/2023   Chronic obstructive pulmonary disease with acute exacerbation (HCC) 07/14/2023   Chronic HFrEF (heart failure with reduced ejection fraction) (HCC) 07/06/2023   Atrial fibrillation with RVR (HCC) 03/07/2023   History of tachycardia-bradycardia syndrome (HCC) 03/07/2023   Demand ischemia (HCC) 03/07/2023   Current severe episode of major depressive disorder without psychotic features without prior episode (HCC) 03/06/2022   GAD (generalized anxiety disorder) 03/06/2022   Caregiver role strain 03/06/2022   Adjustment disorder with mixed anxiety and depressed mood 03/03/2022   Diplopia 01/15/2022   History of CVA (cerebrovascular accident) 01/15/2022   Thoracic spine pain 11/22/2021   Tinnitus of both ears 11/22/2021   Bilateral lower extremity edema 11/22/2021   Acute CVA (cerebrovascular accident) (HCC) 02/06/2021   Dyslipidemia, goal LDL below 130 07/10/2020   Iron  deficiency anemia 07/10/2020   Shortness of breath 12/23/2019   Asthma 12/23/2019   Morbid obesity (HCC) 09/11/2017   Chronic fatigue  05/27/2016   A-fib (HCC) 03/25/2016   Typical atrial flutter (HCC)    Chronic diastolic CHF (congestive heart failure) (HCC) 12/08/2015   Atrial fibrillation with rapid ventricular response (HCC) 12/08/2015   Thoracic compression fracture (HCC) 11/16/2015    Orthostatic hypotension 11/15/2015   Osteopenia 09/12/2015   Vitamin D  deficiency disease 08/29/2015   Polymyalgia rheumatica 04/18/2015   OSA (obstructive sleep apnea) 03/04/2015   Myalgia 11/22/2014   Primary hypertension 08/21/2014   Anxiety and depression 08/21/2014   Past Medical History:  Diagnosis Date   Acute systolic heart failure (HCC) 07/08/2023   Allergy    Anemia    Anxiety    Arthritis 1990's   neck and lower back (03/25/2016)   Asthma 1990s X 1   short term inhaler use    CAD (coronary artery disease)    Cardiac pacemaker in situ    MDT   CHF (congestive heart failure) (HCC)    Chronic lower back pain    COVID-19 03/06/2022   Degenerative disorder of bone    Depression    Diastolic dysfunction    Drug-induced lupus erythematosus    HCTZ induced; still gettin over it (03/25/2016)   Foot swelling 05/19/2022   GERD (gastroesophageal reflux disease) 1990's   Headache, unspecified headache type 03/06/2022   Herniated disc, cervical    Hospital discharge follow-up 03/14/2022   Hyperlipidemia    Hypertension    Neuromuscular disorder (HCC)    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 HTN   Obesity (BMI 30-39.9) 11/07/2022   Orthostatic hypotension    OSA on CPAP    Osteopenia    Osteoporosis 2012   PAF (paroxysmal atrial fibrillation) (HCC)    Paroxysmal atrial fibrillation (HCC) 12/13/2021   Phlebitis after infusion 03/18/2023   Pinched nerve in neck    PND (post-nasal drip) 12/18/2022   Rapid atrial fibrillation (HCC) 07/06/2023   Sinus node dysfunction (HCC)    Sinus pressure 12/18/2022   Sleep apnea 1990's   wears CPAP   Stroke (HCC)    T12 compression fracture (HCC) 11/2015   Vitamin D  deficiency    Whiplash injury 06/07/2010   Past Surgical History:  Procedure Laterality Date   APPENDECTOMY  1990s   ATRIAL FIBRILLATION ABLATION N/A 03/25/2016   Procedure: Atrial Fibrillation Ablation;  Surgeon: Lynwood Rakers, MD;  Location: [facility]  INVASIVE CV LAB;  Service: Cardiovascular;  Laterality: N/A;   ATRIAL FIBRILLATION ABLATION N/A 01/31/2020   Procedure: ATRIAL FIBRILLATION ABLATION;  Surgeon: Rakers Lynwood, MD;  Location: [facility] INVASIVE CV LAB;  Service: Cardiovascular;  Laterality: N/A;   ATRIAL FIBRILLATION ABLATION N/A 12/13/2021   Procedure: ATRIAL FIBRILLATION ABLATION;  Surgeon: [name], MD;  Location: [facility] INVASIVE CV LAB;  Service: Cardiovascular;  Laterality: N/A;   AV NODE ABLATION N/A 09/15/2023   Procedure: AV NODE ABLATION;  Surgeon: [name], MD;  Location: [facility] INVASIVE CV LAB;  Service: Cardiovascular;  Laterality: N/A;   CARDIOVERSION N/A 07/09/2023   Procedure: CARDIOVERSION;  Surgeon: Okey Vina GAILS, MD;  Location: [facility] INVASIVE CV LAB;  Service: Cardiovascular;  Laterality: N/A;   COLONOSCOPY     FOREARM FRACTURE SURGERY Left ~ 02/2011   broke arm; shattered wrist   FOREARM HARDWARE REMOVAL Left ~ 07/2011   implantable loop recorder placement  03/07/2019   Medtronic Reveal Linq model LNQ 22 implantable loop recorder ([id number]) implanted by Dr Rakers for Afib management   INSERT / REPLACE / REMOVE PACEMAKER  2025  LOOP RECORDER REMOVAL N/A 03/09/2023   Procedure: LOOP RECORDER REMOVAL;  Surgeon: Kennyth Chew, MD;  Location: [facility] INVASIVE CV LAB;  Service: Cardiovascular;  Laterality: N/A;   PACEMAKER IMPLANT N/A 03/09/2023   Procedure: PACEMAKER IMPLANT;  Surgeon: Kennyth Chew, MD;  Location: [facility] INVASIVE CV LAB;  Service: Cardiovascular;  Laterality: N/A;   Spinal Nerve Ablation     TEE WITHOUT CARDIOVERSION N/A 03/24/2016   Procedure: TRANSESOPHAGEAL ECHOCARDIOGRAM (TEE);  Surgeon: Jerel Balding, MD;  Location: [facility] ENDOSCOPY;  Service: Cardiovascular;  Laterality: N/A;   Family History  Problem Relation Age of Onset   Lung cancer Mother    Cancer Mother    Early death Mother    Miscarriages / [place] Mother    Vision loss Mother    Stroke Father    Hypertension Father    Heart  disease Father    Hypertension Maternal Grandmother    Stroke Maternal Grandfather    Heart disease Maternal Grandfather    Vision loss Maternal Grandfather    Diabetes Paternal Grandmother    Heart disease Paternal Grandmother    Diabetes Paternal Grandfather    Hearing loss Paternal Grandfather    Vision loss Paternal Grandfather    Bipolar disorder Daughter    Other Daughter        fatty liver   Other Son        fattye liver, born with 1 kidney   ADD / ADHD Daughter    Alcohol abuse Daughter    Depression Daughter    Hypertension Daughter    Learning disabilities Daughter    Miscarriages / Stillbirths Daughter    Obesity Daughter    Vision loss Daughter    ADD / ADHD Son    Birth defects Son    Diabetes Son    Hearing loss Son    Learning disabilities Son    Obesity Son    Vision loss Son    Heart disease Brother    Hypertension Brother    Heart disease Sister    Hypertension Sister    Vision loss Sister    Obesity Sister    Colon cancer Neg Hx    Esophageal cancer Neg Hx    Rectal cancer Neg Hx    Stomach cancer Neg Hx    Outpatient Medications Prior to Visit  Medication Sig Dispense Refill   acetaminophen  (TYLENOL ) 500 MG tablet Take 1,000 mg by mouth every 6 (six) hours as needed for moderate pain or headache.     amLODipine  (NORVASC ) 5 MG tablet Take 1 tablet (5 mg total) by mouth daily. 90 tablet 3   busPIRone  (BUSPAR ) 5 MG tablet Take 1 tablet (5 mg total) by mouth 2 (two) times daily. 200 tablet 0   Cholecalciferol  (VITAMIN D ) 50 MCG (2000 UT) CAPS Take 2,000 Units by mouth in the morning.     ELIQUIS  5 MG TABS tablet TAKE 1 TABLET BY MOUTH TWICE  DAILY 200 tablet 2   empagliflozin  (JARDIANCE ) 10 MG TABS tablet Take 1 tablet (10 mg total) by mouth daily before breakfast. 90 tablet 3   furosemide  (LASIX ) 20 MG tablet Take 4 tablets (80 mg total) by mouth daily. 120 tablet 5   Hydrocortisone (CORTIZONE-10 EX) Apply 1 application  topically as needed (skin  irritation/itching).     levalbuterol  (XOPENEX  HFA) 45 MCG/ACT inhaler Inhale 2 puffs into the lungs every 4 (four) hours as needed for wheezing. 1 each 2   metoprolol  succinate (TOPROL -XL) 100 MG 24 hr  tablet Take 1 tablet (100 mg total) by mouth daily. Take with or immediately following a meal.     Multiple Vitamin (MULTIVITAMIN) tablet Take 1 tablet by mouth at bedtime.     olmesartan  (BENICAR ) 40 MG tablet Take 1 tablet (40 mg total) by mouth daily. 90 tablet 3   potassium chloride  SA (KLOR-CON  M) 20 MEQ tablet Take 2 tablets (40 mEq total) by mouth daily. Take with furosemide . 60 tablet 5   simvastatin  (ZOCOR ) 20 MG tablet TAKE 1 TABLET BY MOUTH DAILY 100 tablet 2   No facility-administered medications prior to visit.   Allergies  Allergen Reactions   Definity  [Perflutren  Lipid Microsphere] Other (See Comments)    Had unresponsive episode with concerns on allergic reaction. AVOID.   Ivp Dye [Iodinated Contrast Media] Shortness Of Breath   Spironolactone  Shortness Of Breath   Sulfur Nausea And Vomiting   Ace Inhibitors Cough   Amiodarone  Other (See Comments)    Intolerance multiple side effects   Entresto  [Sacubitril -Valsartan ]     Discontinued per Dr. Cindie due to severe hypertension   Hctz [Hydrochlorothiazide ] Other (See Comments)    Caused drug-induced LUPUS   Oxycodone Other (See Comments)    Hallucinations   Prednisone  Other (See Comments)    Made patient very aggressive   Sulfa Antibiotics Nausea And Vomiting   Symbicort  [Budesonide -Formoterol  Fumarate]     BP crashed directed to discontinue per MD   Voltaren [Diclofenac Sodium] Other (See Comments)    Made patient become aggressive     ROS: A complete ROS was performed with pertinent positives/negatives noted in the HPI. The remainder of the ROS are negative.    Objective:   Today's Vitals   10/20/23 1418  BP: 126/63  Pulse: 80  Temp: 98.3 F (36.8 C)  TempSrc: Oral  SpO2: 100%  Weight: 186 lb (84.4 kg)   Height: 5' 7 (1.702 m)    GENERAL: Well-appearing, in NAD. Well nourished.  SKIN: Pink, warm and dry. No rash, lesion, ulceration, or ecchymoses.  Head: Normocephalic. NECK: Trachea midline. Full ROM w/o pain or tenderness. No lymphadenopathy.  EARS: Tympanic membranes are intact, translucent without bulging and without drainage. Appropriate landmarks visualized.  EYES: Conjunctiva clear without exudates. EOMI, PERRL, no drainage present.  NOSE: Septum midline w/o deformity. Nares patent, mucosa pink and non-inflamed w/o drainage. No sinus tenderness.  THROAT: Uvula midline. Oropharynx clear. Tonsils non-inflamed without exudate. Mucous membranes pink and moist.  RESPIRATORY: Chest wall symmetrical. Respirations even and non-labored. Breath sounds clear to auscultation bilaterally.  CARDIAC: S1, S2 present, regular rate and rhythm without murmur or gallops. Peripheral pulses 2+ bilaterally.  MSK: Muscle tone and strength appropriate for age. Joints w/o tenderness, redness, or swelling.  EXTREMITIES: Without clubbing, cyanosis, or edema.  NEUROLOGIC: No motor or sensory deficits. Steady, even gait. C2-C12 intact.  PSYCH/MENTAL STATUS: Alert, oriented x 3. Cooperative, appropriate mood and affect.    No results found for any visits on 10/20/23.    Assessment & Plan:  ***  No orders of the defined types were placed in this encounter.  Lab Orders  No laboratory test(s) ordered today   No images are attached to the encounter or orders placed in the encounter.  No follow-ups on file.    Patient to reach out to office if new, worrisome, or unresolved symptoms arise or if no improvement in patient's condition. Patient verbalized understanding and is agreeable to treatment plan. All questions answered to patient's satisfaction.    Ercia Crisafulli Olivia  Knute, FNP

## 2023-10-21 ENCOUNTER — Encounter (HOSPITAL_BASED_OUTPATIENT_CLINIC_OR_DEPARTMENT_OTHER): Payer: Self-pay | Admitting: Family Medicine

## 2023-10-21 ENCOUNTER — Ambulatory Visit (HOSPITAL_COMMUNITY)

## 2023-10-21 ENCOUNTER — Ambulatory Visit

## 2023-10-21 LAB — CUP PACEART REMOTE DEVICE CHECK
Battery Remaining Longevity: 154 mo
Battery Voltage: 3.15 V
Brady Statistic AP VP Percent: 96.22 %
Brady Statistic AP VS Percent: 0.01 %
Brady Statistic AS VP Percent: 3.47 %
Brady Statistic AS VS Percent: 0.3 %
Brady Statistic RA Percent Paced: 96.39 %
Brady Statistic RV Percent Paced: 99.69 %
Date Time Interrogation Session: 20251015093641
Implantable Lead Connection Status: 753985
Implantable Lead Connection Status: 753985
Implantable Lead Implant Date: 20250303
Implantable Lead Implant Date: 20250303
Implantable Lead Location: 753859
Implantable Lead Location: 753860
Implantable Lead Model: 3830
Implantable Lead Model: 5076
Implantable Pulse Generator Implant Date: 20250303
Lead Channel Impedance Value: 304 Ohm
Lead Channel Impedance Value: 380 Ohm
Lead Channel Impedance Value: 418 Ohm
Lead Channel Impedance Value: 532 Ohm
Lead Channel Pacing Threshold Amplitude: 0.375 V
Lead Channel Pacing Threshold Amplitude: 0.875 V
Lead Channel Pacing Threshold Pulse Width: 0.4 ms
Lead Channel Pacing Threshold Pulse Width: 0.4 ms
Lead Channel Sensing Intrinsic Amplitude: 15.375 mV
Lead Channel Sensing Intrinsic Amplitude: 2.5 mV
Lead Channel Sensing Intrinsic Amplitude: 2.5 mV
Lead Channel Sensing Intrinsic Amplitude: 4 mV
Lead Channel Setting Pacing Amplitude: 1.5 V
Lead Channel Setting Pacing Amplitude: 1.5 V
Lead Channel Setting Pacing Pulse Width: 0.4 ms
Lead Channel Setting Sensing Sensitivity: 1.2 mV
Zone Setting Status: 755011
Zone Setting Status: 755011

## 2023-10-22 ENCOUNTER — Ambulatory Visit: Payer: Self-pay | Admitting: Cardiology

## 2023-10-23 ENCOUNTER — Ambulatory Visit (INDEPENDENT_AMBULATORY_CARE_PROVIDER_SITE_OTHER): Admitting: Behavioral Health

## 2023-10-23 ENCOUNTER — Encounter: Payer: Self-pay | Admitting: Behavioral Health

## 2023-10-23 DIAGNOSIS — F4323 Adjustment disorder with mixed anxiety and depressed mood: Secondary | ICD-10-CM

## 2023-10-23 NOTE — Progress Notes (Signed)
 [facility] Counselor/Therapist Progress Note  Patient ID: Shannon [name], MRN: [id number],    Date: October 23, 2023  Time Spent: 3 PM until 3:54 PM, 54 minutes.  The patient could not connect via video so at 3:05 PM we switched to audio visit.  5 minutes from 3:00 until 305 for an attempted video visits and 305 until 350 for audio visits.  This session was held via video teletherapy. The patient consented to the video teletherapy and was located in her home during this session. She is aware it is the responsibility of the patient to secure confidentiality on her end of the session. The provider was in a private home office for the duration of this session.     Reported Symptoms: Anxiety, depression  Mental Status Exam: Appearance:  Well Groomed     Behavior: Appropriate  Motor: Normal  Speech/Language:  Clear and Coherent  Affect: Appropriate for  Mood: normal  Thought process: normal  Thought content:   WNL  Sensory/Perceptual disturbances:   WNL  Orientation: oriented to person, place, time/date, situation, day of week, month of year, and year  Attention: Good  Concentration: Good  Memory: WNL  Fund of knowledge:  Good  Insight:   Good  Judgment:  Good  Impulse Control: Good   Risk Assessment: Danger to Self:  No Self-injurious Behavior: No Danger to Others: No Duty to Warn:no Physical Aggression / Violence:No  Access to Firearms a concern: No  Gang Involvement:No   Subjective: The patient has had a cold this week therefore she has not felt as well but she continues to progress with physical therapy.  Because of not being well she had to cancel some appointments with other medical professionals but has those scheduled soon.  Her biggest stressor is that she got a call about a court hearing for a debt that her husband incurred.  She said that there were probably 10 credit cards that he took out and when he would max him out he will get another 1.  This  particular 1 was for certain amount of money which he forged her signature on.  She says that she knows that there is nothing that she can do about it because she did not agree to it or sign for it and that she does not have any money to pay for it so for the most part she feels that she has let that go.  She is so overwhelmed with feeling like she finally can have some closure when something else seems to pop up that he did that she was not aware of.  She recognizes that cognitively and emotionally she is trying to push through and is making steady progress but will continue to work on letting go.  She does contract for safety having no thoughts of hurting herself or anyone else. Interventions: Cognitive Behavioral Therapy  Diagnosis: Adjustment disorder with mixed anxiety and depressed mood.  Plan: I will meet with the patient every 2 weeks via video session TX. Plan:To use cognitive behavioral therapy principles as well as elements of dialectical behavior therapy.  Goals are to reduce anxiety and depression by at least 50% with a target date of November 06, 2022.  Goals are to have less sadness as indicated by PH-9 scores as well as patient report.  We also work on improving mood and return to a healthier level of functioning as defined by her goals for being happy, identify causes and process triggers for depressed mood.  We will use cognitive behavioral therapy to explore and replace unhealthy thoughts and behavior patterns contributing to depression.  I will continue to encourage shearing of feelings related to the causes and symptoms of depression as well as teach and encouraged use of coping skills for management of depressive symptoms.  We also will work to improve the patient's ability to manage anxiety symptoms and better handle stress, identify causes for anxiety and explore ways for reduction of anxiety in addition to resolving conflicts contributing to anxiety and managing thoughts and worrisome  thinking is contributing to anxiety.  Interventions will include providing education about anxiety, facilitate problem solution skills, teaching coping skills for managing anxiety such as grounding exercises, progressive muscle relaxation and cognitive re framing etc.  We will also use cognitive behavioral therapy to identify and change anxiety provoking thoughts and behavior patterns as well as using DBT distress tolerance and mindfulness skills. I reviewed the treatment plan goals with the patient who agreed to continue with goals as stated above. Progress: 40%New target date will be May 05, 2024 Lorrene CHRISTELLA Hasten, [facility]                                                Lorrene CHRISTELLA Hasten, [facility]               Lorrene CHRISTELLA Hasten, [facility]               Lorrene CHRISTELLA Hasten, [facility]               Lorrene CHRISTELLA Hasten, [facility]               Lorrene CHRISTELLA Hasten, [facility]               Lorrene CHRISTELLA Hasten, [facility]               Lorrene CHRISTELLA Hasten, [facility]               Lorrene CHRISTELLA Hasten, [facility]               Lorrene CHRISTELLA Hasten, [facility]               Lorrene CHRISTELLA Hasten, [facility]               Lorrene CHRISTELLA Hasten, [facility]               Lorrene CHRISTELLA Hasten, [facility]               Lorrene CHRISTELLA Hasten, [facility]               Lorrene CHRISTELLA Hasten, [facility]               Lorrene CHRISTELLA Hasten, [facility]               Lorrene CHRISTELLA Hasten, [facility]               Lorrene CHRISTELLA Hasten, [facility]               Lorrene CHRISTELLA Hasten, [facility]               Lorrene CHRISTELLA Hasten, [facility]               Lorrene CHRISTELLA Hasten, [facility]

## 2023-10-23 NOTE — Progress Notes (Signed)
 Remote PPM Transmission

## 2023-10-26 ENCOUNTER — Ambulatory Visit (HOSPITAL_COMMUNITY)
Admission: RE | Admit: 2023-10-26 | Discharge: 2023-10-26 | Disposition: A | Discharge status: Home / Self Care | Source: Ambulatory Visit | Attending: Cardiology | Admitting: Cardiology

## 2023-10-26 DIAGNOSIS — I5022 Chronic systolic (congestive) heart failure: Secondary | ICD-10-CM | POA: Insufficient documentation

## 2023-10-26 LAB — BASIC METABOLIC PANEL WITH GFR
Anion gap: 10 (ref 5–15)
BUN: 12 mg/dL (ref 8–23)
CO2: 29 mmol/L (ref 22–32)
Calcium: 9.3 mg/dL (ref 8.9–10.3)
Chloride: 102 mmol/L (ref 98–111)
Creatinine, Ser: 0.85 mg/dL (ref 0.44–1.00)
GFR, Estimated: >60 mL/min (ref >60)
Glucose, Bld: 91 mg/dL (ref 70–99)
Potassium: 4 mmol/L (ref 3.5–5.1)
Sodium: 141 mmol/L (ref 135–145)

## 2023-10-27 ENCOUNTER — Ambulatory Visit: Admitting: Physical Therapy

## 2023-10-27 DIAGNOSIS — R2681 Unsteadiness on feet: Secondary | ICD-10-CM

## 2023-10-27 DIAGNOSIS — M6281 Muscle weakness (generalized): Secondary | ICD-10-CM

## 2023-10-27 DIAGNOSIS — R0602 Shortness of breath: Secondary | ICD-10-CM

## 2023-10-27 NOTE — Therapy (Addendum)
 OUTPATIENT PHYSICAL THERAPY NEURO   Patient Name: Shannon [name] MRN: [id number] DOB:[birth year], 73 y.o., female Today's Date: 10/27/2023   PCP: Thersia Stark REFERRING PROVIDER: Toribio Furnace  END OF SESSION:  PT End of Session - 10/27/23 0908     Visit Number 6    Date for Recertification  12/02/23    Authorization Type [employer]    PT Start Time 0910    PT Stop Time 0955    PT Time Calculation (min) 45 min          Past Medical History:  Diagnosis Date   Acute systolic heart failure (HCC) 07/08/2023   Allergy    Anemia    Anxiety    Arthritis 1990's   neck and lower back (03/25/2016)   Asthma 1990s X 1   short term inhaler use    CAD (coronary artery disease)    Cardiac pacemaker in situ    MDT   CHF (congestive heart failure) (HCC)    Chronic lower back pain    COVID-19 03/06/2022   Degenerative disorder of bone    Depression    Diastolic dysfunction    Drug-induced lupus erythematosus    HCTZ induced; still gettin over it (03/25/2016)   Foot swelling 05/19/2022   GERD (gastroesophageal reflux disease) 1990's   Headache, unspecified headache type 03/06/2022   Herniated disc, cervical    Hospital discharge follow-up 03/14/2022   Hyperlipidemia    Hypertension    Neuromuscular disorder (HCC)    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 HTN   Obesity (BMI 30-39.9) 11/07/2022   Orthostatic hypotension    OSA on CPAP    Osteopenia    Osteoporosis 2012   PAF (paroxysmal atrial fibrillation) (HCC)    Paroxysmal atrial fibrillation (HCC) 12/13/2021   Phlebitis after infusion 03/18/2023   Pinched nerve in neck    PND (post-nasal drip) 12/18/2022   Rapid atrial fibrillation (HCC) 07/06/2023   Sinus node dysfunction (HCC)    Sinus pressure 12/18/2022   Sleep apnea 1990's   wears CPAP   Stroke (HCC)    T12 compression fracture (HCC) 11/2015   Vitamin D  deficiency    Whiplash injury 06/07/2010   Past Surgical History:  Procedure  Laterality Date   APPENDECTOMY  1990s   ATRIAL FIBRILLATION ABLATION N/A 03/25/2016   Procedure: Atrial Fibrillation Ablation;  Surgeon: Lynwood Rakers, MD;  Location: [facility] INVASIVE CV LAB;  Service: Cardiovascular;  Laterality: N/A;   ATRIAL FIBRILLATION ABLATION N/A 01/31/2020   Procedure: ATRIAL FIBRILLATION ABLATION;  Surgeon: Rakers Lynwood, MD;  Location: [facility] INVASIVE CV LAB;  Service: Cardiovascular;  Laterality: N/A;   ATRIAL FIBRILLATION ABLATION N/A 12/13/2021   Procedure: ATRIAL FIBRILLATION ABLATION;  Surgeon: [name], MD;  Location: [facility] INVASIVE CV LAB;  Service: Cardiovascular;  Laterality: N/A;   AV NODE ABLATION N/A 09/15/2023   Procedure: AV NODE ABLATION;  Surgeon: [name], MD;  Location: [facility] INVASIVE CV LAB;  Service: Cardiovascular;  Laterality: N/A;   CARDIOVERSION N/A 07/09/2023   Procedure: CARDIOVERSION;  Surgeon: Okey Vina GAILS, MD;  Location: [facility] INVASIVE CV LAB;  Service: Cardiovascular;  Laterality: N/A;   COLONOSCOPY     FOREARM FRACTURE SURGERY Left ~ 02/2011   broke arm; shattered wrist   FOREARM HARDWARE REMOVAL Left ~ 07/2011   implantable loop recorder placement  03/07/2019   Medtronic Reveal Linq model LNQ 22 implantable loop recorder ([id number]) implanted by Dr Rakers for Afib management   INSERT /  REPLACE / REMOVE PACEMAKER  2025   LOOP RECORDER REMOVAL N/A 03/09/2023   Procedure: LOOP RECORDER REMOVAL;  Surgeon: Kennyth Chew, MD;  Location: [facility] INVASIVE CV LAB;  Service: Cardiovascular;  Laterality: N/A;   PACEMAKER IMPLANT N/A 03/09/2023   Procedure: PACEMAKER IMPLANT;  Surgeon: Kennyth Chew, MD;  Location: [facility] INVASIVE CV LAB;  Service: Cardiovascular;  Laterality: N/A;   Spinal Nerve Ablation     TEE WITHOUT CARDIOVERSION N/A 03/24/2016   Procedure: TRANSESOPHAGEAL ECHOCARDIOGRAM (TEE);  Surgeon: Jerel Balding, MD;  Location: [facility] ENDOSCOPY;  Service: Cardiovascular;  Laterality: N/A;   Patient Active Problem List   Diagnosis Date Noted    Hypertensive urgency 08/28/2023   Hypokalemia 08/28/2023   Acute on chronic systolic CHF (congestive heart failure) (HCC) 08/27/2023   Chronic obstructive pulmonary disease with acute exacerbation (HCC) 07/14/2023   Chronic HFrEF (heart failure with reduced ejection fraction) (HCC) 07/06/2023   Atrial fibrillation with RVR (HCC) 03/07/2023   History of tachycardia-bradycardia syndrome (HCC) 03/07/2023   Demand ischemia (HCC) 03/07/2023   Current severe episode of major depressive disorder without psychotic features without prior episode (HCC) 03/06/2022   GAD (generalized anxiety disorder) 03/06/2022   Caregiver role strain 03/06/2022   Adjustment disorder with mixed anxiety and depressed mood 03/03/2022   Diplopia 01/15/2022   History of CVA (cerebrovascular accident) 01/15/2022   Thoracic spine pain 11/22/2021   Tinnitus of both ears 11/22/2021   Bilateral lower extremity edema 11/22/2021   Acute CVA (cerebrovascular accident) (HCC) 02/06/2021   Dyslipidemia, goal LDL below 130 07/10/2020   Iron  deficiency anemia 07/10/2020   Shortness of breath 12/23/2019   Asthma 12/23/2019   Morbid obesity (HCC) 09/11/2017   Chronic fatigue 05/27/2016   A-fib (HCC) 03/25/2016   Typical atrial flutter (HCC)    Chronic diastolic CHF (congestive heart failure) (HCC) 12/08/2015   Atrial fibrillation with rapid ventricular response (HCC) 12/08/2015   Thoracic compression fracture (HCC) 11/16/2015   Orthostatic hypotension 11/15/2015   Osteopenia 09/12/2015   Vitamin D  deficiency disease 08/29/2015   Polymyalgia rheumatica 04/18/2015   OSA (obstructive sleep apnea) 03/04/2015   Myalgia 11/22/2014   Primary hypertension 08/21/2014   Anxiety and depression 08/21/2014    ONSET DATE: 08/31/23  REFERRING DIAG:  Mobility status PT Visit Diagnosis: Unsteadiness on feet (R26.81);Muscle weakness (generalized) (M62.81)      THERAPY DIAG:  Gait instability  Muscle weakness  (generalized)  Shortness of breath  Rationale for Evaluation and Treatment: Rehabilitation  SUBJECTIVE:  SUBJECTIVE STATEMENT: picked up virus from my grandson, thought is heart but that was okay. Leg/nerve pain no better now going down leg. Forgot to call about cardiac rehab PERTINENT HISTORY: long history of atrial fibrillation with multiple prior catheter ablations, history of heart failure, dyspnea History of CVA  PAIN:  Are you having pain yes down RT leg PRECAUTIONS: None  RED FLAGS: None   WEIGHT BEARING RESTRICTIONS: No  FALLS: Has patient fallen in last 6 months? Yes. Number of falls 1, was moving houses and tripped over a step   LIVING ENVIRONMENT: Lives with: lives with their daughter Lives in: House/apartment Stairs: Yes: Internal: full set but does not go up them steps; on right going up and External: 3 steps; can reach both Has following equipment at home: Vannie - 2 wheeled, Environmental consultant - 4 wheeled, and Wheelchair (manual)  PLOF: Independent, Independent with basic ADLs, Independent with community mobility without device, and Independent with gait  PATIENT GOALS: whatever you can do to make me better physically   OBJECTIVE:  Note: Objective measures were completed at Evaluation unless otherwise noted.  DIAGNOSTIC FINDINGS: N/A  COGNITION: Overall cognitive status: Within functional limits for tasks assessed   SENSATION: Light touch: some numbness in toes and spreads to feet   COORDINATION: Decrease balance and coordination   POSTURE: rounded shoulders  LOWER EXTREMITY ROM:  WFL BLE, some pain with R hip flexion   LOWER EXTREMITY MMT:    MMT Right Eval Left Eval  Hip flexion 3+ 4  Hip extension    Hip abduction 4+ 5  Hip adduction    Hip internal rotation    Hip  external rotation    Knee flexion 5 5  Knee extension 4 4+  Ankle dorsiflexion    Ankle plantarflexion    Ankle inversion    Ankle eversion    (Blank rows = not tested)   STAIRS: Findings: Level of Assistance: Modified independence and Stair Negotiation Technique: Step to Pattern with Bilateral Rails GAIT: Findings: Gait Characteristics: narrow BOS, poor foot clearance- Right, and poor foot clearance- Left, unstable especially with turns, unsteady on her feet   FUNCTIONAL TESTS:  5 times sit to stand: 16s Timed up and go (TUG): 11.30s 3 minute walk test: 658ft 97%, 80bpm BERG 45/56                                                                                                                               TREATMENT DATE:   10/27/23 Nustep L 5 5 min HS curl 25# 2 sets 10 Knee ext 10# 2 sets 10 Seated row 20# 2 sets 10 Seated resisted trunk ext 2 sets 10 Leg press 30# 2 sets 10. Calf raises 30# 2 sets 10 STS with wt ball chest press 10 x Mech lumbar traction  60 #  10/15/23 Nustep L 5 O2  98 HR 86 STS 5 x 11.5  450 feet 3 min PRE  9/10, O2 100. HR 73 STS with wt ball chest press 10 x 6 inch step up with opp leg ext 10 xBIL with UE 6 inch lateral step up with opp leg abd 10 x BIL with UE HS Curls 25lb 2x10 Leg Ext 10lb 2x10 Seated row 20# 2 sets 10 Seated resisted trunk ext 2 sets 10  10/13/23 Bike L 3 O2  98    HR  80 STS with wt ball press 10 x 2 sets Resisted gait 30# 5 x fwd and back and 3 x each side Leg press 30# 2 sets 10 ( seat on 7, tried 6 but too difficult) 6 in step ups with light HHA 10 x BIL HS Curls 25lb 2x10 Leg Ext 10lb 2x10 6# farmer carry 1 lap each hand Stepping over objects CGA     10/07/23 Bike L 2.5 x 6 min  O2 98%  Goals  Sit to stands holding 2lb 2x10 HS Curls 25lb 2x10 Leg Ext 10lb 2x10 6in step ups x10 each  4in lateral step ups x10 Heel Raises black bar 2x10   10/01/23  Nustep L 2 5 min  O2 after 96 Red tband 2  sets 10 HS curl,LAQ,clams,hip abd and hip flexion STS with 3# chest press 2 sets 5, O2 after 95 Red tband 10 x SL hip flex,ext and abd Feet on ball bridge,KTC and obl  10 x each Iso abdominals with ball 10 x hold 3 sec Resisted gait 3 x 4 ways    09/23/23 EVAL    PATIENT EDUCATION: Education details: POC, HEP Person educated: Patient Education method: Medical illustrator Education comprehension: verbalized understanding and returned demonstration  HOME EXERCISE PROGRAM: Access Code: [email or web address] URL: [email or web address] Date: 10/01/2023 Prepared by: Amela Handley  Exercises - Standing Hip Flexion with Resistance Loop  - 1 x daily - 7 x weekly - 1 sets - 10 reps - Hip Extension with Resistance Loop  - 1 x daily - 7 x weekly - 1 sets - 10 reps - Hip Abduction with Resistance Loop  - 1 x daily - 7 x weekly - 1 sets - 10 reps - Seated Long Arc Quad  - 1 x daily - 7 x weekly - 1 sets - 10 reps - Seated March  - 1 x daily - 7 x weekly - 1 sets - 10 reps - Seated Hip Abduction with Resistance  - 1 x daily - 7 x weekly - 10 reps  Access Code: [email or web address] URL: [email or web address] Date: 09/23/2023 Prepared by: Almetta Fam  Exercises - Sit to Stand  - 1 x daily - 7 x weekly - 2 sets - 10 reps - Standing Single Leg Stance with Counter Support  - 1 x daily - 7 x weekly - 10 reps - 10 hold - Standing Tandem Balance with Counter Support  - 1 x daily - 7 x weekly - 10 reps - 15 hold  GOALS: Goals reviewed with patient? Yes  SHORT TERM GOALS: Target date: 10/28/23  Patient will be independent with initial HEP. Baseline:  Goal status: 10/01/23 MET  2.  Patient will demonstrate improved functional LE strength as demonstrated by 5xSTS <13s. Baseline: 16s Goal status: 14.60 sec Progressing 10/07/23   MET 10/15/23   LONG TERM GOALS: Target date: 12/02/23  Patient will be independent with advanced/ongoing HEP to improve outcomes and carryover.   Baseline:  Goal status: progressing 10/27/23  2.  Patient will be able to ambulate 800' in with less  staggering and unsteadiness Baseline: 630ft some SOB and needs SBA Goal status: 10/15/23 450 feet 3 min PRE 9/10. Limited today d/t SOB/cough from virus last week 10/27/23  3.  Patient will score 53 on Berg Balance test to demonstrate lower risk of falls. (MCID= 8 points) .  Baseline: 45 Goal status: INITIAL  4.  Patient will able to return to her normal household chores/routine without SOB and fatigue.  Baseline:  Goal status: progressing 10/15/23  and 10/27/23  5.  Patient will reports decrease in pain in R hip and groin <2/10. Baseline: 7/10 at worst Goal status: ongoing 10/07/23  10/27/23 progressing  ASSESSMENT:  CLINICAL IMPRESSION:  goals assessed and documented. Pt is making progress with skilled therapy but set back with virus last week. Pt feels nerve pain in RT leg is going further down leg, so trial of mech traction to see if helps pain. Slowly progressed ex today with cuing as she is still recovering from virus last week.  Patient is a 74 y.o. female who was seen today for physical therapy evaluation and treatment for generalized weakness. She has an extensive medical history of heart issues, including a-fib and heart failure. She reports being sick in June and everything went downhill since then. Recent AV node ablation was on 09/15/23. Pt reports difficulty with her normal activities due to fatigue, shortness or breath, and weakness. She reports some staggering and wobbling when walking but she also thinks she has some vision problems that contribute to this. She does have a walker that she uses for longer distances. With functional tests completed today, she does present as a fall risk. Therefore, she will benefit from skilled PT to address weakness, endurance, gait, and balance impairments to decrease risk for fall and help her return to PLOF.  OBJECTIVE IMPAIRMENTS:  Abnormal gait, cardiopulmonary status limiting activity, decreased activity tolerance, decreased balance, decreased coordination, decreased endurance, decreased strength, increased edema, and pain.   ACTIVITY LIMITATIONS: carrying, squatting, stairs, and locomotion level  PARTICIPATION LIMITATIONS: meal prep, cleaning, laundry, shopping, community activity, and yard work  PERSONAL FACTORS: Age, Fitness, and 1-2 comorbidities: a-fib, heart failure are also affecting patient's functional outcome.   REHAB POTENTIAL: Good  CLINICAL DECISION MAKING: Stable/uncomplicated  EVALUATION COMPLEXITY: Low  PLAN:  PT FREQUENCY: 2x/week  PT DURATION: 10 weeks  PLANNED INTERVENTIONS: 97110-Therapeutic exercises, 97530- Therapeutic activity, [id number]- Neuromuscular re-education, 97535- Self Care, [id number]- Manual therapy, [phone]- Gait training, Patient/Family education, Balance training, Stair training, Cryotherapy, and Moist heat  PLAN FOR NEXT SESSION:assess how traction was, progress ex and balance. Check BERG   Leiah Giannotti,ANGIE, PTA 10/27/2023, 9:11 AM

## 2023-10-29 ENCOUNTER — Ambulatory Visit: Admitting: Physical Therapy

## 2023-10-30 ENCOUNTER — Ambulatory Visit: Admitting: Behavioral Health

## 2023-10-30 ENCOUNTER — Encounter: Payer: Self-pay | Admitting: Behavioral Health

## 2023-10-30 ENCOUNTER — Encounter (HOSPITAL_BASED_OUTPATIENT_CLINIC_OR_DEPARTMENT_OTHER): Payer: Self-pay | Admitting: Family Medicine

## 2023-10-30 DIAGNOSIS — F4323 Adjustment disorder with mixed anxiety and depressed mood: Secondary | ICD-10-CM | POA: Diagnosis not present

## 2023-10-30 NOTE — Progress Notes (Signed)
   ADVANCED HEART FAILURE CLINIC NOTE  Primary Care: Knute [name], FNP Primary Cardiologist: Dr. Arnetha HF Cardiologist: Dr. Zenaida  HPI: Shannon [name] is a 73 y.o. female with atrial fibrillation, HFrEF, SSS, & COPD.  Pertinent Family & social hx: see below  Cardiac History:  - Afib diagnosed in 2017 with PVI ablations in 2018, 2022 - CVA in 2/23, Xarelto  discontinued & transition to apixaban .  - Tikosyn  2023 discontinued due to QT prolongation; stopped amio for blurred vision.  - Admitted 8/25 with atrial fibrillation & volume overload. TTE w/ LVEF 30-35%. Long history of atrial fibrillation with intolerance to anti-arryhtmics.  - AVN ablation on 09/15/23   Interval hx:  Today she returns for HF follow up. Overall feeling fine. She has SOB carrying laundry or lifting. She has SOB walking further distances on flat ground. She feels palpitations and jolts in chest. She had chest pressures this week, resolved spontaneously, not exertional. Feels slight swelling in upper abdomen, she is concerned this is fluid. Legs swell when on her feet for a long time. She is constipated. Denies palpitations, abnormal bleeding, dizziness, or PND/Orthopnea. Appetite ok. Weight at home 185 pounds. Taking all medications. Wears CPAP.   Wt Readings from Last 3 Encounters:  11/04/23 86.3 kg (190 lb 3.2 oz)  10/20/23 84.4 kg (186 lb)  10/15/23 84.9 kg (187 lb 1.6 oz)   BP 122/70   Pulse 84   Wt 86.3 kg (190 lb 3.2 oz)   SpO2 94%   BMI 29.79 kg/m   PHYSICAL EXAM: General:  NAD. No resp difficulty, walked into clinic HEENT: Normal Neck: Supple. JVP 10 Cor: Regular rate & rhythm. No rubs, gallops or murmurs. Lungs: Clear Abdomen: Soft, nontender, nondistended.  Extremities: No cyanosis, clubbing, rash, trace ankle edema Neuro: Alert & oriented x 3, moves all 4 extremities w/o difficulty. Affect pleasant.  Device interrogation (personally reviewed): 3.1 hr/day activity,  99.7% VP, 10% AF  ReDs reading: 37%, abnormal  DATA REVIEW  ECG: 09/02/23: atrial fibrillation   ECHO: 07/07/23: LVEF 30-35%, normal RV function. Global hypokinesis.   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 - PET perfusion in 06/17/23 with no evidence of ischemia or infarction. Moderate coronary calcifications noted on CT imaging. Low risk study overall despite reduced EF (38%).  - NYHA II, volume better than previous but up a bit. REDs 37% - Increase Lasix  to 80/40, increase KCL to 40/20.  - Continue olmesartan  40 mg daily (did not tolerate Entresto ) - Continue Toprol  XL 100 mg daily.  - Continue Jardiance  10 mg daily. - Did not tolerate spironolactone .  - ICD: Underwent dc-pacemaker implant in 3/25 for tachy-brady syndrome w/ symptomatic sinus pauses.  - Advanced therapies: Not a candidate currently.  - Repeat echo in 3 months now that she is s/p AVN ablation - Labs today, repeat BMET in 10-14 days  2. Atrial fibrillation  - As noted above; very symptomatic / difficult to control associated tachy-brady  - Unable to tolerate multiple anti-arrythmics. - s/p dcPPM in 3/25.  - s/p AVN ablation 9/25 - Continue Eliquis  5 mg bid.   3. HTN - BP well controlled - Continue amlodipine  5  Follow up in 3-4 months with Dr. Zenaida + echo  [place], FNP-BC Advanced Heart Failure 11/04/23

## 2023-10-30 NOTE — Progress Notes (Signed)
 [facility] Counselor/Therapist Progress Note  Patient ID: Shannon [name], MRN: [id number],    Date: October 30, 2023  Time Spent: 3:05 PM until 3:59 PM, 54 minutes.   This session was held via video teletherapy. The patient consented to the video teletherapy and was located in her home during this session. She is aware it is the responsibility of the patient to secure confidentiality on her end of the session. The provider was in a private home office for the duration of this session.     Reported Symptoms: Anxiety, depression  Mental Status Exam: Appearance:  Well Groomed     Behavior: Appropriate  Motor: Normal  Speech/Language:  Clear and Coherent  Affect: Appropriate for  Mood: normal  Thought process: normal  Thought content:   WNL  Sensory/Perceptual disturbances:   WNL  Orientation: oriented to person, place, time/date, situation, day of week, month of year, and year  Attention: Good  Concentration: Good  Memory: WNL  Fund of knowledge:  Good  Insight:   Good  Judgment:  Good  Impulse Control: Good   Risk Assessment: Danger to Self:  No Self-injurious Behavior: No Danger to Others: No Duty to Warn:no Physical Aggression / Violence:No  Access to Firearms a concern: No  Gang Involvement:No   Subjective: The patient is still in significant pain.  She describes it as a burning sensation starting at about where the incision was made for her heart procedure going down into her foot.  There are very few times with her sitting standing walking lying where she does not feel it.  She dressed it with her primary care doctor who thought there could be an embolism and wanted to request an ultrasound but consulted with her cardiologist who did not think that is what it was so an ultrasound was not ordered.  She meets with her cardiologist next Wednesday and is hoping he will have some idea as to why she has had this pain for 7 weeks since the heart procedure and  does not appear to be getting better.  It affects even her sleeping.  She has been listening to a podcast about narcissistic personality traits.  She recognized already that her husband was a narcissist but hearing this podcast has made her recognize that her husband was so focused on what he wanted that he really did not care about all that she was doing to keep the marriage and the family together.  She says she has been herself up for years because she Staying in the marriage when probably would have been easier.  We talked about how things look clearer looking back but that she and her faith on her daughter and her family by trying to make the marriage work and then the think she could reset her down would have made any difference in who he was.  She did say that they did another ultrasound with the baby at 2 months and there is only 1 and it is doing well.  She does contract for safety having no thoughts of hurting herself or anyone else. Interventions: Cognitive Behavioral Therapy  Diagnosis: Adjustment disorder with mixed anxiety and depressed mood.  Plan: I will meet with the patient every 2 weeks via video session TX. Plan:To use cognitive behavioral therapy principles as well as elements of dialectical behavior therapy.  Goals are to reduce anxiety and depression by at least 50% with a target date of November 06, 2022.  Goals are to have less sadness  as indicated by PH-9 scores as well as patient report.  We also work on improving mood and return to a healthier level of functioning as defined by her goals for being happy, identify causes and process triggers for depressed mood.  We will use cognitive behavioral therapy to explore and replace unhealthy thoughts and behavior patterns contributing to depression.  I will continue to encourage shearing of feelings related to the causes and symptoms of depression as well as teach and encouraged use of coping skills for management of depressive  symptoms.  We also will work to improve the patient's ability to manage anxiety symptoms and better handle stress, identify causes for anxiety and explore ways for reduction of anxiety in addition to resolving conflicts contributing to anxiety and managing thoughts and worrisome thinking is contributing to anxiety.  Interventions will include providing education about anxiety, facilitate problem solution skills, teaching coping skills for managing anxiety such as grounding exercises, progressive muscle relaxation and cognitive re framing etc.  We will also use cognitive behavioral therapy to identify and change anxiety provoking thoughts and behavior patterns as well as using DBT distress tolerance and mindfulness skills. I reviewed the treatment plan goals with the patient who agreed to continue with goals as stated above. Progress: 40%New target date will be May 05, 2024 Lorrene CHRISTELLA Hasten, [facility]                                                Lorrene CHRISTELLA Hasten, [facility]               Lorrene CHRISTELLA Hasten, [facility]               Lorrene CHRISTELLA Hasten, [facility]               Lorrene CHRISTELLA Hasten, [facility]               Lorrene CHRISTELLA Hasten, [facility]               Lorrene CHRISTELLA Hasten, [facility]               Lorrene CHRISTELLA Hasten, [facility]               Lorrene CHRISTELLA Hasten, [facility]               Lorrene CHRISTELLA Hasten, [facility]               Lorrene CHRISTELLA Hasten, [facility]               Lorrene CHRISTELLA Hasten, [facility]               Lorrene CHRISTELLA Hasten, [facility]               Lorrene CHRISTELLA Hasten, [facility]               Lorrene CHRISTELLA Hasten, [facility]               Lorrene CHRISTELLA Hasten, [facility]               Lorrene CHRISTELLA Hasten, [facility]               Lorrene CHRISTELLA Hasten, [facility]               Lorrene CHRISTELLA Hasten, [facility]  Lorrene CHRISTELLA Hasten,  [facility]               Lorrene CHRISTELLA Hasten, [facility]               Lorrene CHRISTELLA Hasten, [facility]

## 2023-11-02 ENCOUNTER — Encounter (HOSPITAL_BASED_OUTPATIENT_CLINIC_OR_DEPARTMENT_OTHER): Payer: Self-pay | Admitting: Family Medicine

## 2023-11-02 ENCOUNTER — Other Ambulatory Visit (HOSPITAL_BASED_OUTPATIENT_CLINIC_OR_DEPARTMENT_OTHER): Payer: Self-pay | Admitting: Family Medicine

## 2023-11-02 DIAGNOSIS — F411 Generalized anxiety disorder: Secondary | ICD-10-CM

## 2023-11-02 DIAGNOSIS — R0602 Shortness of breath: Secondary | ICD-10-CM

## 2023-11-02 MED ORDER — BUSPIRONE HCL 5 MG PO TABS: 5.0000 mg | ORAL_TABLET | Freq: Two times a day (BID) | ORAL | 2 refills | Status: AC

## 2023-11-02 MED ORDER — LEVALBUTEROL TARTRATE 45 MCG/ACT IN AERO
2.0000 | INHALATION_SPRAY | RESPIRATORY_TRACT | 3 refills | Status: DC | PRN
Start: 2023-11-02 — End: 2023-11-30

## 2023-11-02 NOTE — Telephone Encounter (Signed)
 Please see mychart message sent by pt and advise.

## 2023-11-03 ENCOUNTER — Telehealth (HOSPITAL_COMMUNITY): Payer: Self-pay

## 2023-11-03 ENCOUNTER — Other Ambulatory Visit (HOSPITAL_BASED_OUTPATIENT_CLINIC_OR_DEPARTMENT_OTHER): Payer: Self-pay | Admitting: Family Medicine

## 2023-11-03 DIAGNOSIS — M545 Low back pain, unspecified: Secondary | ICD-10-CM

## 2023-11-03 DIAGNOSIS — M79604 Pain in right leg: Secondary | ICD-10-CM

## 2023-11-03 NOTE — Telephone Encounter (Signed)
 Called to confirm/remind patient of their appointment [facility] on 11/04/23.   Appointment:   [] Confirmed  [x] Left mess   [] No answer/No voice mail  [] VM Full/unable to leave message  [] Phone not in service  And to bring in all medications and/or complete list.
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
## 2023-11-04 ENCOUNTER — Ambulatory Visit

## 2023-11-04 ENCOUNTER — Ambulatory Visit (HOSPITAL_BASED_OUTPATIENT_CLINIC_OR_DEPARTMENT_OTHER)
Admission: RE | Admit: 2023-11-04 | Discharge: 2023-11-04 | Disposition: A | Discharge status: Home / Self Care | Source: Ambulatory Visit | Attending: Family Medicine | Admitting: Family Medicine

## 2023-11-04 ENCOUNTER — Ambulatory Visit (HOSPITAL_COMMUNITY): Payer: Self-pay | Admitting: Family Medicine

## 2023-11-04 ENCOUNTER — Encounter (HOSPITAL_COMMUNITY): Payer: Self-pay

## 2023-11-04 ENCOUNTER — Ambulatory Visit (HOSPITAL_COMMUNITY)
Admission: RE | Admit: 2023-11-04 | Discharge: 2023-11-04 | Disposition: A | Discharge status: Home / Self Care | Source: Ambulatory Visit | Attending: Family Medicine | Admitting: Family Medicine

## 2023-11-04 VITALS — BP 122/70 | HR 84 | Wt 190.2 lb

## 2023-11-04 DIAGNOSIS — R2689 Other abnormalities of gait and mobility: Secondary | ICD-10-CM

## 2023-11-04 DIAGNOSIS — I110 Hypertensive heart disease with heart failure: Secondary | ICD-10-CM | POA: Diagnosis not present

## 2023-11-04 DIAGNOSIS — I10 Essential (primary) hypertension: Secondary | ICD-10-CM

## 2023-11-04 DIAGNOSIS — R2681 Unsteadiness on feet: Secondary | ICD-10-CM

## 2023-11-04 DIAGNOSIS — G8929 Other chronic pain: Secondary | ICD-10-CM

## 2023-11-04 DIAGNOSIS — Z79899 Other long term (current) drug therapy: Secondary | ICD-10-CM | POA: Diagnosis not present

## 2023-11-04 DIAGNOSIS — I495 Sick sinus syndrome: Secondary | ICD-10-CM | POA: Diagnosis not present

## 2023-11-04 DIAGNOSIS — I5022 Chronic systolic (congestive) heart failure: Secondary | ICD-10-CM | POA: Diagnosis not present

## 2023-11-04 DIAGNOSIS — I480 Paroxysmal atrial fibrillation: Secondary | ICD-10-CM

## 2023-11-04 DIAGNOSIS — I4891 Unspecified atrial fibrillation: Secondary | ICD-10-CM | POA: Diagnosis not present

## 2023-11-04 DIAGNOSIS — I2510 Atherosclerotic heart disease of native coronary artery without angina pectoris: Secondary | ICD-10-CM | POA: Insufficient documentation

## 2023-11-04 DIAGNOSIS — Z8673 Personal history of transient ischemic attack (TIA), and cerebral infarction without residual deficits: Secondary | ICD-10-CM | POA: Insufficient documentation

## 2023-11-04 DIAGNOSIS — M6281 Muscle weakness (generalized): Secondary | ICD-10-CM

## 2023-11-04 DIAGNOSIS — R0602 Shortness of breath: Secondary | ICD-10-CM

## 2023-11-04 DIAGNOSIS — Z7901 Long term (current) use of anticoagulants: Secondary | ICD-10-CM | POA: Insufficient documentation

## 2023-11-04 DIAGNOSIS — J449 Chronic obstructive pulmonary disease, unspecified: Secondary | ICD-10-CM | POA: Diagnosis not present

## 2023-11-04 DIAGNOSIS — Z7984 Long term (current) use of oral hypoglycemic drugs: Secondary | ICD-10-CM | POA: Diagnosis not present

## 2023-11-04 DIAGNOSIS — Z950 Presence of cardiac pacemaker: Secondary | ICD-10-CM | POA: Diagnosis not present

## 2023-11-04 DIAGNOSIS — M79604 Pain in right leg: Secondary | ICD-10-CM

## 2023-11-04 LAB — BASIC METABOLIC PANEL WITH GFR
Anion gap: 15 (ref 5–15)
BUN: 14 mg/dL (ref 8–23)
CO2: 27 mmol/L (ref 22–32)
Calcium: 8.9 mg/dL (ref 8.9–10.3)
Chloride: 98 mmol/L (ref 98–111)
Creatinine, Ser: 0.77 mg/dL (ref 0.44–1.00)
GFR, Estimated: >60 mL/min (ref >60)
Glucose, Bld: 80 mg/dL (ref 70–99)
Potassium: 3.7 mmol/L (ref 3.5–5.1)
Sodium: 140 mmol/L (ref 135–145)

## 2023-11-04 LAB — BRAIN NATRIURETIC PEPTIDE: B Natriuretic Peptide: 280.8 pg/mL — ABNORMAL HIGH (ref 0.0–100.0)

## 2023-11-04 MED ORDER — FUROSEMIDE 20 MG PO TABS: ORAL_TABLET | ORAL | 11 refills | Status: DC

## 2023-11-04 MED ORDER — POTASSIUM CHLORIDE CRYS ER 20 MEQ PO TBCR: EXTENDED_RELEASE_TABLET | ORAL | 11 refills | Status: AC

## 2023-11-04 NOTE — Progress Notes (Signed)
 ReDS Vest / Clip - 11/04/23 1200       ReDS Vest / Clip   Station Marker C    Ruler Value 29    ReDS Value Range Moderate volume overload    ReDS Actual Value 37

## 2023-11-04 NOTE — Patient Instructions (Addendum)
 Thank you for coming in today  If you had labs drawn today, any labs that are abnormal the clinic will call you No news is good news  Medications: Increase Lasix  to 80 mg 2 tablets every morning and 40 mg 1 tablet every evening Increase Potassium 40 meq 2 tablets every morning and 20 meq every evening  Follow up appointments: Your physician recommends that you return for lab work in: 10-14 days BMET  Your physician recommends that you schedule a follow-up appointment in:  3 months with Dr. Zenaida with echocardiogram  Please call our office to schedule the follow-up appointment in December 2025 for January 2026   Your physician has requested that you have an echocardiogram. Echocardiography is a painless test that uses sound waves to create images of your heart. It provides your doctor with information about the size and shape of your heart and how well your heart's chambers and valves are working. This procedure takes approximately one hour. There are no restrictions for this procedure.    Do the following things EVERYDAY: Weigh yourself in the morning before breakfast. Write it down and keep it in a log. Take your medicines as prescribed Eat low salt foods--Limit salt (sodium) to 2000 mg per day.  Stay as active as you can everyday Limit all fluids for the day to less than 2 liters   At the [facility], you and your health needs are our priority. As part of our continuing mission to provide you with exceptional heart care, we have created designated Provider Care Teams. These Care Teams include your primary Cardiologist (physician) and Advanced Practice Providers (APPs- Physician Assistants and Nurse Practitioners) who all work together to provide you with the care you need, when you need it.   You may see any of the following providers on your designated Care Team at your next follow up: Dr Toribio Fuel Dr Ezra Shuck Dr. Ria Gardenia Greig Lenetta,  NP Caffie Shed, [place] [facility] [place], [place] Beckey Coe, NP Tinnie Redman, PharmD   Please be sure to bring in all your medications bottles to every appointment.    Thank you for choosing [facility]  If you have any questions or concerns before your next appointment please send us  a message through [place] or call our office at [phone].    TO LEAVE A MESSAGE FOR THE NURSE SELECT OPTION 2, PLEASE LEAVE A MESSAGE INCLUDING: YOUR NAME DATE OF BIRTH CALL BACK NUMBER REASON FOR CALL**this is important as we prioritize the call backs  YOU WILL RECEIVE A CALL BACK THE SAME DAY AS LONG AS YOU CALL BEFORE 4:00 PM

## 2023-11-05 ENCOUNTER — Telehealth (HOSPITAL_COMMUNITY): Payer: Self-pay

## 2023-11-05 ENCOUNTER — Ambulatory Visit: Attending: Family Medicine

## 2023-11-05 NOTE — Addendum Note (Signed)
 Encounter addended by: Buell Powell HERO, RN on: 11/05/2023 10:45 AM  Actions taken: Charge Capture section accepted

## 2023-11-05 NOTE — Telephone Encounter (Signed)
 Called patient to see if she is interested in the Cardiac Rehab Program. Patient expressed interest. Explained scheduling process, patient verbalized understanding. Will contact patient for scheduling once pt has beebn reviewed.

## 2023-11-05 NOTE — Telephone Encounter (Signed)
 Pt insurance is active and benefits verified through [facility] Medicare Co-pay 0, DED 0/0 met, out of pocket $3,900/$3,900 met, co-insurance 0%. no pre-authorization required. Passport, 11/05/2023@3 :23, REF# [id number]   TCR/ICR? ICR Visit(date of service)limitation? No limit Can multiple codes be used on the same date of service/visit?(IF ITS A LIMIT) n/a    Is this a lifetime maximum or an annual maximum? annual Has the member used any of these services to date? no Is there a time limit (weeks/months) on start of program and/or program completion? no   Will contact patient to see if she is interested in the Cardiac Rehab Program. If interested, patient will need to complete follow up appt. Once completed, patient will be contacted for scheduling upon review by the RN Navigator.

## 2023-11-06 ENCOUNTER — Ambulatory Visit: Admitting: Physical Therapy

## 2023-11-06 ENCOUNTER — Encounter: Payer: Self-pay | Admitting: Behavioral Health

## 2023-11-06 ENCOUNTER — Ambulatory Visit (HOSPITAL_BASED_OUTPATIENT_CLINIC_OR_DEPARTMENT_OTHER): Payer: Self-pay | Admitting: Family Medicine

## 2023-11-06 ENCOUNTER — Ambulatory Visit: Admitting: Behavioral Health

## 2023-11-06 DIAGNOSIS — F4323 Adjustment disorder with mixed anxiety and depressed mood: Secondary | ICD-10-CM | POA: Diagnosis not present

## 2023-11-06 DIAGNOSIS — M6281 Muscle weakness (generalized): Secondary | ICD-10-CM

## 2023-11-06 DIAGNOSIS — R2681 Unsteadiness on feet: Secondary | ICD-10-CM
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
## 2023-11-06 NOTE — Therapy (Signed)
 OUTPATIENT PHYSICAL THERAPY NEURO   Patient Name: Shannon [name] MRN: [id number] DOB:1950-10-04, 73 y.o., female Today's Date: 11/06/2023   PCP: Thersia Stark REFERRING PROVIDER: Toribio Furnace  END OF SESSION:  PT End of Session - 11/06/23 0926     Visit Number 8    Date for Recertification  12/02/23    Authorization Type [employer]    PT Start Time 9073    PT Stop Time 1010    PT Time Calculation (min) 44 min           Past Medical History:  Diagnosis Date   Acute systolic heart failure (HCC) 07/08/2023   Allergy    Anemia    Anxiety    Arthritis 1990's   neck and lower back (03/25/2016)   Asthma 1990s X 1   short term inhaler use    CAD (coronary artery disease)    Cardiac pacemaker in situ    MDT   CHF (congestive heart failure) (HCC)    Chronic lower back pain    COVID-19 03/06/2022   Degenerative disorder of bone    Depression    Diastolic dysfunction    Drug-induced lupus erythematosus    HCTZ induced; still gettin over it (03/25/2016)   Foot swelling 05/19/2022   GERD (gastroesophageal reflux disease) 1990's   Headache, unspecified headache type 03/06/2022   Herniated disc, cervical    Hospital discharge follow-up 03/14/2022   Hyperlipidemia    Hypertension    Neuromuscular disorder (HCC)    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 HTN   Obesity (BMI 30-39.9) 11/07/2022   Orthostatic hypotension    OSA on CPAP    Osteopenia    Osteoporosis 2012   PAF (paroxysmal atrial fibrillation) (HCC)    Paroxysmal atrial fibrillation (HCC) 12/13/2021   Phlebitis after infusion 03/18/2023   Pinched nerve in neck    PND (post-nasal drip) 12/18/2022   Rapid atrial fibrillation (HCC) 07/06/2023   Sinus node dysfunction (HCC)    Sinus pressure 12/18/2022   Sleep apnea 1990's   wears CPAP   Stroke (HCC)    T12 compression fracture (HCC) 11/2015   Vitamin D  deficiency    Whiplash injury 06/07/2010   Past Surgical History:  Procedure  Laterality Date   APPENDECTOMY  1990s   ATRIAL FIBRILLATION ABLATION N/A 03/25/2016   Procedure: Atrial Fibrillation Ablation;  Surgeon: Lynwood Rakers, MD;  Location: [facility] INVASIVE CV LAB;  Service: Cardiovascular;  Laterality: N/A;   ATRIAL FIBRILLATION ABLATION N/A 01/31/2020   Procedure: ATRIAL FIBRILLATION ABLATION;  Surgeon: Rakers Lynwood, MD;  Location: [facility] INVASIVE CV LAB;  Service: Cardiovascular;  Laterality: N/A;   ATRIAL FIBRILLATION ABLATION N/A 12/13/2021   Procedure: ATRIAL FIBRILLATION ABLATION;  Surgeon: [name], MD;  Location: [facility] INVASIVE CV LAB;  Service: Cardiovascular;  Laterality: N/A;   AV NODE ABLATION N/A 09/15/2023   Procedure: AV NODE ABLATION;  Surgeon: [name], MD;  Location: [facility] INVASIVE CV LAB;  Service: Cardiovascular;  Laterality: N/A;   CARDIOVERSION N/A 07/09/2023   Procedure: CARDIOVERSION;  Surgeon: Okey Vina GAILS, MD;  Location: [facility] INVASIVE CV LAB;  Service: Cardiovascular;  Laterality: N/A;   COLONOSCOPY     FOREARM FRACTURE SURGERY Left ~ 02/2011   broke arm; shattered wrist   FOREARM HARDWARE REMOVAL Left ~ 07/2011   implantable loop recorder placement  03/07/2019   Medtronic Reveal Linq model LNQ 22 implantable loop recorder ([id number]) implanted by Dr Rakers for Afib management   INSERT /  REPLACE / REMOVE PACEMAKER  2025   LOOP RECORDER REMOVAL N/A 03/09/2023   Procedure: LOOP RECORDER REMOVAL;  Surgeon: Kennyth Chew, MD;  Location: [facility] INVASIVE CV LAB;  Service: Cardiovascular;  Laterality: N/A;   PACEMAKER IMPLANT N/A 03/09/2023   Procedure: PACEMAKER IMPLANT;  Surgeon: Kennyth Chew, MD;  Location: [facility] INVASIVE CV LAB;  Service: Cardiovascular;  Laterality: N/A;   Spinal Nerve Ablation     TEE WITHOUT CARDIOVERSION N/A 03/24/2016   Procedure: TRANSESOPHAGEAL ECHOCARDIOGRAM (TEE);  Surgeon: Jerel Balding, MD;  Location: [facility] ENDOSCOPY;  Service: Cardiovascular;  Laterality: N/A;   Patient Active Problem List   Diagnosis Date Noted    Hypertensive urgency 08/28/2023   Hypokalemia 08/28/2023   Acute on chronic systolic CHF (congestive heart failure) (HCC) 08/27/2023   Chronic obstructive pulmonary disease with acute exacerbation (HCC) 07/14/2023   Chronic HFrEF (heart failure with reduced ejection fraction) (HCC) 07/06/2023   Atrial fibrillation with RVR (HCC) 03/07/2023   History of tachycardia-bradycardia syndrome (HCC) 03/07/2023   Demand ischemia (HCC) 03/07/2023   Current severe episode of major depressive disorder without psychotic features without prior episode (HCC) 03/06/2022   GAD (generalized anxiety disorder) 03/06/2022   Caregiver role strain 03/06/2022   Adjustment disorder with mixed anxiety and depressed mood 03/03/2022   Diplopia 01/15/2022   History of CVA (cerebrovascular accident) 01/15/2022   Thoracic spine pain 11/22/2021   Tinnitus of both ears 11/22/2021   Bilateral lower extremity edema 11/22/2021   Acute CVA (cerebrovascular accident) (HCC) 02/06/2021   Dyslipidemia, goal LDL below 130 07/10/2020   Iron  deficiency anemia 07/10/2020   Shortness of breath 12/23/2019   Asthma 12/23/2019   Morbid obesity (HCC) 09/11/2017   Chronic fatigue 05/27/2016   A-fib (HCC) 03/25/2016   Typical atrial flutter (HCC)    Chronic diastolic CHF (congestive heart failure) (HCC) 12/08/2015   Atrial fibrillation with rapid ventricular response (HCC) 12/08/2015   Thoracic compression fracture (HCC) 11/16/2015   Orthostatic hypotension 11/15/2015   Osteopenia 09/12/2015   Vitamin D  deficiency disease 08/29/2015   Polymyalgia rheumatica 04/18/2015   OSA (obstructive sleep apnea) 03/04/2015   Myalgia 11/22/2014   Primary hypertension 08/21/2014   Anxiety and depression 08/21/2014    ONSET DATE: 08/31/23  REFERRING DIAG:  Mobility status PT Visit Diagnosis: Unsteadiness on feet (R26.81);Muscle weakness (generalized) (M62.81)      THERAPY DIAG:  Gait instability  Muscle weakness  (generalized)  Rationale for Evaluation and Treatment: Rehabilitation  SUBJECTIVE:  SUBJECTIVE STATEMENT: traction was BAD. Awaiting xray results. Pain still with sitting and lying/sleeping. Starts in RT groin and travels down leg    PERTINENT HISTORY: long history of atrial fibrillation with multiple prior catheter ablations, history of heart failure, dyspnea History of CVA  PAIN:  Are you having pain yes down RT leg PRECAUTIONS: None  RED FLAGS: None   WEIGHT BEARING RESTRICTIONS: No  FALLS: Has patient fallen in last 6 months? Yes. Number of falls 1, was moving houses and tripped over a step   LIVING ENVIRONMENT: Lives with: lives with their daughter Lives in: House/apartment Stairs: Yes: Internal: full set but does not go up them steps; on right going up and External: 3 steps; can reach both Has following equipment at home: Vannie - 2 wheeled, Environmental Consultant - 4 wheeled, and Wheelchair (manual)  PLOF: Independent, Independent with basic ADLs, Independent with community mobility without device, and Independent with gait  PATIENT GOALS: whatever you can do to make me better physically   OBJECTIVE:  Note: Objective measures were completed at Evaluation unless otherwise noted.  DIAGNOSTIC FINDINGS: N/A  COGNITION: Overall cognitive status: Within functional limits for tasks assessed   SENSATION: Light touch: some numbness in toes and spreads to feet   COORDINATION: Decrease balance and coordination   POSTURE: rounded shoulders  LOWER EXTREMITY ROM:  WFL BLE, some pain with R hip flexion   LOWER EXTREMITY MMT:    MMT Right Eval Left Eval  Hip flexion 3+ 4  Hip extension    Hip abduction 4+ 5  Hip adduction    Hip internal rotation    Hip external rotation    Knee flexion 5 5   Knee extension 4 4+  Ankle dorsiflexion    Ankle plantarflexion    Ankle inversion    Ankle eversion    (Blank rows = not tested)   STAIRS: Findings: Level of Assistance: Modified independence and Stair Negotiation Technique: Step to Pattern with Bilateral Rails GAIT: Findings: Gait Characteristics: narrow BOS, poor foot clearance- Right, and poor foot clearance- Left, unstable especially with turns, unsteady on her feet   FUNCTIONAL TESTS:  5 times sit to stand: 16s Timed up and go (TUG): 11.30s 3 minute walk test: 618ft 97%, 80bpm BERG 45/56                                                                                                                               TREATMENT DATE:   11/06/23 Nustep L 5 PROM and stretching RT LE esp HS,quad and hip flexor and ABD STW to above with and without theragun Stretching and issued for home- supine RT leg off bed hang, clam stretch supine, standing HS and ABD Pelvic ROM on dyna disc, isometric RT hip strengthening ( flex,abd,add,IR,ER)      11/04/23 Walking laps holding 3# weights  Step ups 6 Walking on beam NuStep L5x19mins Leg ext 10# 2x10 HS curls 25# 2x10 STS  holding yellow ball on airex 2x10   10/27/23 Nustep L 5 5 min HS curl 25# 2 sets 10 Knee ext 10# 2 sets 10 Seated row 20# 2 sets 10 Seated resisted trunk ext 2 sets 10 Leg press 30# 2 sets 10. Calf raises 30# 2 sets 10 STS with wt ball chest press 10 x Mech lumbar traction  60 #  10/15/23 Nustep L 5 O2  98 HR 86 STS 5 x 11.5  450 feet 3 min PRE 9/10, O2 100. HR 73 STS with wt ball chest press 10 x 6 inch step up with opp leg ext 10 xBIL with UE 6 inch lateral step up with opp leg abd 10 x BIL with UE HS Curls 25lb 2x10 Leg Ext 10lb 2x10 Seated row 20# 2 sets 10 Seated resisted trunk ext 2 sets 10  10/13/23 Bike L 3 O2  98    HR  80 STS with wt ball press 10 x 2 sets Resisted gait 30# 5 x fwd and back and 3 x each side Leg press 30#  2 sets 10 ( seat on 7, tried 6 but too difficult) 6 in step ups with light HHA 10 x BIL HS Curls 25lb 2x10 Leg Ext 10lb 2x10 6# farmer carry 1 lap each hand Stepping over objects CGA     10/07/23 Bike L 2.5 x 6 min  O2 98%  Goals  Sit to stands holding 2lb 2x10 HS Curls 25lb 2x10 Leg Ext 10lb 2x10 6in step ups x10 each  4in lateral step ups x10 Heel Raises black bar 2x10   10/01/23  Nustep L 2 5 min  O2 after 96 Red tband 2 sets 10 HS curl,LAQ,clams,hip abd and hip flexion STS with 3# chest press 2 sets 5, O2 after 95 Red tband 10 x SL hip flex,ext and abd Feet on ball bridge,KTC and obl  10 x each Iso abdominals with ball 10 x hold 3 sec Resisted gait 3 x 4 ways    09/23/23 EVAL    PATIENT EDUCATION: Education details: POC, HEP Person educated: Patient Education method: Medical Illustrator Education comprehension: verbalized understanding and returned demonstration  HOME EXERCISE PROGRAM: Access Code: [email or web address] URL: [email or web address] Date: 10/01/2023 Prepared by: Nesiah Jump  Exercises - Standing Hip Flexion with Resistance Loop  - 1 x daily - 7 x weekly - 1 sets - 10 reps - Hip Extension with Resistance Loop  - 1 x daily - 7 x weekly - 1 sets - 10 reps - Hip Abduction with Resistance Loop  - 1 x daily - 7 x weekly - 1 sets - 10 reps - Seated Long Arc Quad  - 1 x daily - 7 x weekly - 1 sets - 10 reps - Seated March  - 1 x daily - 7 x weekly - 1 sets - 10 reps - Seated Hip Abduction with Resistance  - 1 x daily - 7 x weekly - 10 reps  Access Code: [email or web address] URL: [email or web address] Date: 09/23/2023 Prepared by: Almetta Fam  Exercises - Sit to Stand  - 1 x daily - 7 x weekly - 2 sets - 10 reps - Standing Single Leg Stance with Counter Support  - 1 x daily - 7 x weekly - 10 reps - 10 hold - Standing Tandem Balance with Counter Support  - 1 x daily - 7 x weekly - 10 reps - 15 hold  GOALS: Goals reviewed with  patient? Yes  SHORT TERM GOALS:  Target date: 10/28/23  Patient will be independent with initial HEP. Baseline:  Goal status: 10/01/23 MET  2.  Patient will demonstrate improved functional LE strength as demonstrated by 5xSTS <13s. Baseline: 16s Goal status: 14.60 sec Progressing 10/07/23   MET 10/15/23   LONG TERM GOALS: Target date: 12/02/23  Patient will be independent with advanced/ongoing HEP to improve outcomes and carryover.  Baseline:  Goal status: progressing 10/27/23  2.  Patient will be able to ambulate 800' in with less staggering and unsteadiness Baseline: 68ft some SOB and needs SBA Goal status: 10/15/23 450 feet 3 min PRE 9/10. Limited today d/t SOB/cough from virus last week 10/27/23  3.  Patient will score 53 on Berg Balance test to demonstrate lower risk of falls. (MCID= 8 points) .  Baseline: 45 Goal status: INITIAL  4.  Patient will able to return to her normal household chores/routine without SOB and fatigue.  Baseline:  Goal status: progressing 10/15/23  and 10/27/23  5.  Patient will reports decrease in pain in R hip and groin <2/10. Baseline: 7/10 at worst Goal status: ongoing 10/07/23  10/27/23 progressing  and  11/06/23   ASSESSMENT:  CLINICAL IMPRESSION:  pnt arrived feeling very frustrated with pain, added more stretching and STW and good relief. Asked pt to start stretching more 4 plus times a day and see if relief gets better.  Patient is a 73 y.o. female who was seen today for physical therapy evaluation and treatment for generalized weakness. She has an extensive medical history of heart issues, including a-fib and heart failure. She reports being sick in June and everything went downhill since then. Recent AV node ablation was on 09/15/23. Pt reports difficulty with her normal activities due to fatigue, shortness or breath, and weakness. She reports some staggering and wobbling when walking but she also thinks she has some vision problems that  contribute to this. She does have a walker that she uses for longer distances. With functional tests completed today, she does present as a fall risk. Therefore, she will benefit from skilled PT to address weakness, endurance, gait, and balance impairments to decrease risk for fall and help her return to PLOF.  OBJECTIVE IMPAIRMENTS: Abnormal gait, cardiopulmonary status limiting activity, decreased activity tolerance, decreased balance, decreased coordination, decreased endurance, decreased strength, increased edema, and pain.   ACTIVITY LIMITATIONS: carrying, squatting, stairs, and locomotion level  PARTICIPATION LIMITATIONS: meal prep, cleaning, laundry, shopping, community activity, and yard work  PERSONAL FACTORS: Age, Fitness, and 1-2 comorbidities: a-fib, heart failure are also affecting patient's functional outcome.   REHAB POTENTIAL: Good  CLINICAL DECISION MAKING: Stable/uncomplicated  EVALUATION COMPLEXITY: Low  PLAN:  PT FREQUENCY: 2x/week  PT DURATION: 10 weeks  PLANNED INTERVENTIONS: 97110-Therapeutic exercises, 97530- Therapeutic activity, [id number]- Neuromuscular re-education, 97535- Self Care, [id number]- Manual therapy, [phone]- Gait training, Patient/Family education, Balance training, Stair training, Cryotherapy, and Moist heat  PLAN FOR NEXT SESSION:assess response to stretching   Yittel Emrich,ANGIE, PTA 11/06/2023, 9:27 AM

## 2023-11-06 NOTE — Progress Notes (Signed)
 Hi Shannon [name], There is some mild to moderate degenerative changes to be expected of the lumbar spine with mild narrowing of the disc space in the lumbar and sacral regions.  There is a chronic thoracic compression fracture at T12.  This would likely be contributing to chronic back pain.  As discussed, this will not show any nerve impingement and an MRI may be needed to show this.  At this time, pain management and/or orthopedic evaluation may be beneficial to determine if the leg pain is being caused from impingement of the nerve.  If you are open to referral, please let me know.

## 2023-11-09 ENCOUNTER — Other Ambulatory Visit: Payer: Self-pay

## 2023-11-09 NOTE — Patient Outreach (Signed)
 Complex Care Management   Visit Note  11/09/2023  Name:  Shannon [name] MRN: [id number] DOB: 06-11-1950  Situation: Referral received for Complex Care Management related to Heart Failure, Atrial Fibrillation, and HTN I obtained verbal consent from Patient.  Visit completed with Patient  on the phone  Background:   Past Medical History:  Diagnosis Date   Acute systolic heart failure (HCC) 07/08/2023   Allergy    Anemia    Anxiety    Arthritis 1990's   neck and lower back (03/25/2016)   Asthma 1990s X 1   short term inhaler use    CAD (coronary artery disease)    Cardiac pacemaker in situ    MDT   CHF (congestive heart failure) (HCC)    Chronic lower back pain    COVID-19 03/06/2022   Degenerative disorder of bone    Depression    Diastolic dysfunction    Drug-induced lupus erythematosus    HCTZ induced; still gettin over it (03/25/2016)   Foot swelling 05/19/2022   GERD (gastroesophageal reflux disease) 1990's   Headache, unspecified headache type 03/06/2022   Herniated disc, cervical    Hospital discharge follow-up 03/14/2022   Hyperlipidemia    Hypertension    Neuromuscular disorder (HCC)    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 HTN   Obesity (BMI 30-39.9) 11/07/2022   Orthostatic hypotension    OSA on CPAP    Osteopenia    Osteoporosis 2012   PAF (paroxysmal atrial fibrillation) (HCC)    Paroxysmal atrial fibrillation (HCC) 12/13/2021   Phlebitis after infusion 03/18/2023   Pinched nerve in neck    PND (post-nasal drip) 12/18/2022   Rapid atrial fibrillation (HCC) 07/06/2023   Sinus node dysfunction (HCC)    Sinus pressure 12/18/2022   Sleep apnea 1990's   wears CPAP   Stroke (HCC)    T12 compression fracture (HCC) 11/2015   Vitamin D  deficiency    Whiplash injury 06/07/2010    Assessment: Patient Reported Symptoms:  Cognitive Cognitive Status: No symptoms reported, Able to follow simple commands, Alert and oriented to person,  place, and time, Normal speech and language skills Cognitive/Intellectual Conditions Management [RPT]: None reported or documented in medical history or problem list   Health Maintenance Behaviors: Annual physical exam, Exercise Health Facilitated by: Healthy diet, Pain control, Rest  Neurological Neurological Review of Symptoms: Not assessed    HEENT HEENT Symptoms Reported: Sudden change or loss of vision HEENT Management Strategies: Medical device, Routine screening HEENT Comment: Patient reports 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 She is following with opthalmology to get updated prescription for her eyeglasses    Cardiovascular Cardiovascular Symptoms Reported: No symptoms reported Does patient have uncontrolled Hypertension?: Yes Is patient checking Blood Pressure at home?: Yes Patient's Recent BP reading at home: 134/72 this morning Cardiovascular Management Strategies: Adequate rest, Coping strategies, Exercise, Medication therapy, Routine screening, Weight management, Medical device (Pacemaker) Do You Have a Working Readable Scale?: Yes Weight: 185 lb (83.9 kg) (Patient reported) Cardiovascular Comment: Note per chart review patient will be starting with cardiac rehab after PT completion. Patient reports weight seems to be controlled with recent increase in diuretic by cardiology.  Respiratory Respiratory Symptoms Reported: No symptoms reported Additional Respiratory Details: Patient reports rare inhaler use Respiratory Management Strategies: Adequate rest, Routine screening  Endocrine Endocrine Symptoms Reported: Not assessed Is patient diabetic?: No    Gastrointestinal Gastrointestinal Symptoms Reported: Not assessed      Genitourinary Genitourinary Symptoms Reported:  Not assessed    Integumentary Integumentary Symptoms Reported: Not assessed    Musculoskeletal Musculoskelatal Symptoms Reviewed: Other, Unsteady gait, Limited mobility Other Musculoskeletal  Symptoms: Patient reports she has had ongoing pain in her right leg since ablation 09/15/23, like a hot bolt of lightning. She most recently had an x-ray of her back 11/06/23. Patient reports she continues to work with PT twice a week. She does feel that massage and stretching exercise provided by PT is helping with her pain, although it is still there. Patient reports she is doing exercises at home. Patient understands PCP recommendation for orthopedic evaluation or pain management if pain continues. Musculoskeletal Management Strategies: Adequate rest, Coping strategies, Exercise, Routine screening, Medical device Musculoskeletal Comment: Patient denies falls since previous [facility] visit Falls in the past year?: Yes Number of falls in past year: 2 or more Was there an injury with Fall?: Yes Fall Risk Category Calculator: 3 Patient Fall Risk Level: High Fall Risk Patient at Risk for Falls Due to: History of fall(s), Impaired balance/gait, Impaired mobility Fall risk Follow up: Falls evaluation completed, Education provided, Falls prevention discussed  Psychosocial Psychosocial Symptoms Reported: Not assessed     Quality of Family Relationships: helpful, involved, supportive Do you feel physically threatened by others?: No    11/09/2023    PHQ2-9 Depression Screening   Little interest or pleasure in doing things    Feeling down, depressed, or hopeless    PHQ-2 - Total Score    Trouble falling or staying asleep, or sleeping too much    Feeling tired or having little energy    Poor appetite or overeating     Feeling bad about yourself - or that you are a failure or have let yourself or your family down    Trouble concentrating on things, such as reading the newspaper or watching television    Moving or speaking so slowly that other people could have noticed.  Or the opposite - being so fidgety or restless that you have been moving around a lot more than usual    Thoughts that you would be  better off dead, or hurting yourself in some way    PHQ2-9 Total Score    If you checked off any problems, how difficult have these problems made it for you to do your work, take care of things at home, or get along with other people    Depression Interventions/Treatment      There were no vitals filed for this visit.  Medications Reviewed Today     Reviewed by Arno Rosaline SQUIBB, RN (Registered Nurse) on 11/09/23 at 1447  Med List Status: <None>   Medication Order Taking? Sig Documenting Provider Last Dose Status Informant  acetaminophen  (TYLENOL ) 500 MG tablet [id number]  Take 1,000 mg by mouth every 6 (six) hours as needed for moderate pain or headache. [provider]  Active Self, Pharmacy Records  amLODipine  (NORVASC ) 5 MG tablet [id number]  Take 1 tablet (5 mg total) by mouth daily. Sabharwal, Aditya, DO  Active   busPIRone  (BUSPAR ) 5 MG tablet [id number]  Take 1 tablet (5 mg total) by mouth 2 (two) times daily. Knute [name], FNP  Active   Cholecalciferol  (VITAMIN D ) 50 MCG (2000 UT) CAPS [id number]  Take 2,000 Units by mouth in the morning. [provider]  Active Self, Pharmacy Records  doxycycline  (VIBRA -TABS) 100 MG tablet [id number]  Take 1 tablet (100 mg total) by mouth 2 (two) times daily. [name], [name], FNP  Active  ELIQUIS  5 MG TABS tablet [id number]  TAKE 1 TABLET BY MOUTH TWICE  DAILY [name], MD  Active Self, Pharmacy Records  empagliflozin  (JARDIANCE ) 10 MG TABS tablet [id number]  Take 1 tablet (10 mg total) by mouth daily before breakfast. Darryle Thom CROME, PA-C  Active Self, Pharmacy Records  furosemide  (LASIX ) 20 MG tablet [id number]  Take 4 tablets (80 mg total) by mouth every morning AND 2 tablets (40 mg total) every evening. [address] Harlene HERO, FNP  Active   Hydrocortisone (CORTIZONE-10 [place]) [id number]  Apply 1 application  topically as needed (skin irritation/itching). [provider]  Active Self, Pharmacy Records   levalbuterol  (XOPENEX  HFA) 45 MCG/ACT inhaler [id number]  Inhale 2 puffs into the lungs every 4 (four) hours as needed for wheezing. [name], Alexis Olivia, FNP  Active   metoprolol  succinate (TOPROL -XL) 100 MG 24 hr tablet [id number]  Take 1 tablet (100 mg total) by mouth daily. Take with or immediately following a meal. Arrien, Elidia Sieving, MD  Active   Multiple Vitamin (MULTIVITAMIN) tablet [id number]  Take 1 tablet by mouth at bedtime. [provider]  Active Self, Pharmacy Records  olmesartan  (BENICAR ) 40 MG tablet [id number]  Take 1 tablet (40 mg total) by mouth daily. Knute [name], FNP  Active   potassium chloride  SA (KLOR-CON  M) 20 MEQ tablet [id number]  Take 2 tablets (40 mEq total) by mouth every morning AND 1 tablet (20 mEq total) every evening. Take with furosemide . [place], [place], [place]  Active   simvastatin  (ZOCOR ) 20 MG tablet [id number]  TAKE 1 TABLET BY MOUTH DAILY [name], NP  Active Self, Pharmacy Records            Recommendation:   Continue Current Plan of Care  Follow Up Plan:   Telephone follow up appointment date/time:  12/07/23 at 11 AM  Rosaline Finlay, RN MSN [place]  [facility] RN Care Manager Direct Dial: [phone]  Fax: [phone]

## 2023-11-09 NOTE — Patient Instructions (Signed)
 Visit Information  Thank you for taking time to visit with me today. Please don't hesitate to contact me if I can be of assistance to you before our next scheduled appointment.  Your next care management appointment is by telephone on 12/07/23 at 11 AM  Please call the care guide team at [phone] if you need to cancel, schedule, or reschedule an appointment.   Please call the Suicide and Crisis Lifeline: 988 call 1-800-273-TALK (toll free, 24 hour hotline) if you are experiencing a Mental Health or Behavioral Health Crisis or need someone to talk to.  Rosaline Finlay, RN MSN [place]  [facility] RN Care Manager Direct Dial: [phone]  Fax: [phone]

## 2023-11-13 ENCOUNTER — Ambulatory Visit (INDEPENDENT_AMBULATORY_CARE_PROVIDER_SITE_OTHER): Admitting: Behavioral Health

## 2023-11-13 ENCOUNTER — Encounter: Payer: Self-pay | Admitting: Behavioral Health

## 2023-11-13 ENCOUNTER — Ambulatory Visit: Attending: Internal Medicine | Admitting: Physical Therapy

## 2023-11-13 DIAGNOSIS — R2681 Unsteadiness on feet: Secondary | ICD-10-CM | POA: Insufficient documentation

## 2023-11-13 DIAGNOSIS — F4323 Adjustment disorder with mixed anxiety and depressed mood: Secondary | ICD-10-CM | POA: Diagnosis not present

## 2023-11-13 DIAGNOSIS — R2689 Other abnormalities of gait and mobility: Secondary | ICD-10-CM | POA: Insufficient documentation

## 2023-11-13 DIAGNOSIS — R0602 Shortness of breath: Secondary | ICD-10-CM | POA: Diagnosis present

## 2023-11-13 DIAGNOSIS — M6281 Muscle weakness (generalized): Secondary | ICD-10-CM | POA: Diagnosis present

## 2023-11-13 NOTE — Therapy (Signed)
 OUTPATIENT PHYSICAL THERAPY NEURO   Patient Name: Shannon [name] MRN: [id number] DOB:June 08, 1950, 73 y.o., female Today's Date: 11/13/2023   PCP: Thersia Stark REFERRING PROVIDER: Toribio Furnace  END OF SESSION:  PT End of Session - 11/13/23 1054     Visit Number 9    Date for Recertification  12/02/23    Authorization Type [employer]    PT Start Time 1055    PT Stop Time 1140    PT Time Calculation (min) 45 min           Past Medical History:  Diagnosis Date   Acute systolic heart failure (HCC) 07/08/2023   Allergy    Anemia    Anxiety    Arthritis 1990's   neck and lower back (03/25/2016)   Asthma 1990s X 1   short term inhaler use    CAD (coronary artery disease)    Cardiac pacemaker in situ    MDT   CHF (congestive heart failure) (HCC)    Chronic lower back pain    COVID-19 03/06/2022   Degenerative disorder of bone    Depression    Diastolic dysfunction    Drug-induced lupus erythematosus    HCTZ induced; still gettin over it (03/25/2016)   Foot swelling 05/19/2022   GERD (gastroesophageal reflux disease) 1990's   Headache, unspecified headache type 03/06/2022   Herniated disc, cervical    Hospital discharge follow-up 03/14/2022   Hyperlipidemia    Hypertension    Neuromuscular disorder (HCC)    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 HTN   Obesity (BMI 30-39.9) 11/07/2022   Orthostatic hypotension    OSA on CPAP    Osteopenia    Osteoporosis 2012   PAF (paroxysmal atrial fibrillation) (HCC)    Paroxysmal atrial fibrillation (HCC) 12/13/2021   Phlebitis after infusion 03/18/2023   Pinched nerve in neck    PND (post-nasal drip) 12/18/2022   Rapid atrial fibrillation (HCC) 07/06/2023   Sinus node dysfunction (HCC)    Sinus pressure 12/18/2022   Sleep apnea 1990's   wears CPAP   Stroke (HCC)    T12 compression fracture (HCC) 11/2015   Vitamin D  deficiency    Whiplash injury 06/07/2010   Past Surgical History:  Procedure  Laterality Date   APPENDECTOMY  1990s   ATRIAL FIBRILLATION ABLATION N/A 03/25/2016   Procedure: Atrial Fibrillation Ablation;  Surgeon: Lynwood Rakers, MD;  Location: [facility] INVASIVE CV LAB;  Service: Cardiovascular;  Laterality: N/A;   ATRIAL FIBRILLATION ABLATION N/A 01/31/2020   Procedure: ATRIAL FIBRILLATION ABLATION;  Surgeon: Rakers Lynwood, MD;  Location: [facility] INVASIVE CV LAB;  Service: Cardiovascular;  Laterality: N/A;   ATRIAL FIBRILLATION ABLATION N/A 12/13/2021   Procedure: ATRIAL FIBRILLATION ABLATION;  Surgeon: [name], MD;  Location: [facility] INVASIVE CV LAB;  Service: Cardiovascular;  Laterality: N/A;   AV NODE ABLATION N/A 09/15/2023   Procedure: AV NODE ABLATION;  Surgeon: [name], MD;  Location: [facility] INVASIVE CV LAB;  Service: Cardiovascular;  Laterality: N/A;   CARDIOVERSION N/A 07/09/2023   Procedure: CARDIOVERSION;  Surgeon: Okey Vina GAILS, MD;  Location: [facility] INVASIVE CV LAB;  Service: Cardiovascular;  Laterality: N/A;   COLONOSCOPY     FOREARM FRACTURE SURGERY Left ~ 02/2011   broke arm; shattered wrist   FOREARM HARDWARE REMOVAL Left ~ 07/2011   implantable loop recorder placement  03/07/2019   Medtronic Reveal Linq model LNQ 22 implantable loop recorder ([id number]) implanted by Dr Rakers for Afib management   INSERT /  REPLACE / REMOVE PACEMAKER  2025   LOOP RECORDER REMOVAL N/A 03/09/2023   Procedure: LOOP RECORDER REMOVAL;  Surgeon: Kennyth Chew, MD;  Location: [facility] INVASIVE CV LAB;  Service: Cardiovascular;  Laterality: N/A;   PACEMAKER IMPLANT N/A 03/09/2023   Procedure: PACEMAKER IMPLANT;  Surgeon: Kennyth Chew, MD;  Location: [facility] INVASIVE CV LAB;  Service: Cardiovascular;  Laterality: N/A;   Spinal Nerve Ablation     TEE WITHOUT CARDIOVERSION N/A 03/24/2016   Procedure: TRANSESOPHAGEAL ECHOCARDIOGRAM (TEE);  Surgeon: Jerel Balding, MD;  Location: [facility] ENDOSCOPY;  Service: Cardiovascular;  Laterality: N/A;   Patient Active Problem List   Diagnosis Date Noted    Hypertensive urgency 08/28/2023   Hypokalemia 08/28/2023   Acute on chronic systolic CHF (congestive heart failure) (HCC) 08/27/2023   Chronic obstructive pulmonary disease with acute exacerbation (HCC) 07/14/2023   Chronic HFrEF (heart failure with reduced ejection fraction) (HCC) 07/06/2023   Atrial fibrillation with RVR (HCC) 03/07/2023   History of tachycardia-bradycardia syndrome (HCC) 03/07/2023   Demand ischemia (HCC) 03/07/2023   Current severe episode of major depressive disorder without psychotic features without prior episode (HCC) 03/06/2022   GAD (generalized anxiety disorder) 03/06/2022   Caregiver role strain 03/06/2022   Adjustment disorder with mixed anxiety and depressed mood 03/03/2022   Diplopia 01/15/2022   History of CVA (cerebrovascular accident) 01/15/2022   Thoracic spine pain 11/22/2021   Tinnitus of both ears 11/22/2021   Bilateral lower extremity edema 11/22/2021   Acute CVA (cerebrovascular accident) (HCC) 02/06/2021   Dyslipidemia, goal LDL below 130 07/10/2020   Iron  deficiency anemia 07/10/2020   Shortness of breath 12/23/2019   Asthma 12/23/2019   Morbid obesity (HCC) 09/11/2017   Chronic fatigue 05/27/2016   A-fib (HCC) 03/25/2016   Typical atrial flutter (HCC)    Chronic diastolic CHF (congestive heart failure) (HCC) 12/08/2015   Atrial fibrillation with rapid ventricular response (HCC) 12/08/2015   Thoracic compression fracture (HCC) 11/16/2015   Orthostatic hypotension 11/15/2015   Osteopenia 09/12/2015   Vitamin D  deficiency disease 08/29/2015   Polymyalgia rheumatica 04/18/2015   OSA (obstructive sleep apnea) 03/04/2015   Myalgia 11/22/2014   Primary hypertension 08/21/2014   Anxiety and depression 08/21/2014    ONSET DATE: 08/31/23  REFERRING DIAG:  Mobility status PT Visit Diagnosis: Unsteadiness on feet (R26.81);Muscle weakness (generalized) (M62.81)      THERAPY DIAG:  Gait instability  Muscle weakness  (generalized)  Rationale for Evaluation and Treatment: Rehabilitation  SUBJECTIVE:  SUBJECTIVE STATEMENT: feeling better, stretches really helping. Still pain but better. BP low for some reason  PERTINENT HISTORY: long history of atrial fibrillation with multiple prior catheter ablations, history of heart failure, dyspnea History of CVA  PAIN:  Are you having pain yes down RT leg PRECAUTIONS: None  RED FLAGS: None   WEIGHT BEARING RESTRICTIONS: No  FALLS: Has patient fallen in last 6 months? Yes. Number of falls 1, was moving houses and tripped over a step   LIVING ENVIRONMENT: Lives with: lives with their daughter Lives in: House/apartment Stairs: Yes: Internal: full set but does not go up them steps; on right going up and External: 3 steps; can reach both Has following equipment at home: Vannie - 2 wheeled, Environmental Consultant - 4 wheeled, and Wheelchair (manual)  PLOF: Independent, Independent with basic ADLs, Independent with community mobility without device, and Independent with gait  PATIENT GOALS: whatever you can do to make me better physically   OBJECTIVE:  Note: Objective measures were completed at Evaluation unless otherwise noted.  DIAGNOSTIC FINDINGS: N/A  COGNITION: Overall cognitive status: Within functional limits for tasks assessed   SENSATION: Light touch: some numbness in toes and spreads to feet   COORDINATION: Decrease balance and coordination   POSTURE: rounded shoulders  LOWER EXTREMITY ROM:  WFL BLE, some pain with R hip flexion   LOWER EXTREMITY MMT:    MMT Right Eval Left Eval  Hip flexion 3+ 4  Hip extension    Hip abduction 4+ 5  Hip adduction    Hip internal rotation    Hip external rotation    Knee flexion 5 5  Knee extension 4 4+  Ankle dorsiflexion     Ankle plantarflexion    Ankle inversion    Ankle eversion    (Blank rows = not tested)   STAIRS: Findings: Level of Assistance: Modified independence and Stair Negotiation Technique: Step to Pattern with Bilateral Rails GAIT: Findings: Gait Characteristics: narrow BOS, poor foot clearance- Right, and poor foot clearance- Left, unstable especially with turns, unsteady on her feet   FUNCTIONAL TESTS:  5 times sit to stand: 16s Timed up and go (TUG): 11.30s 3 minute walk test: 652ft 97%, 80bpm BERG 45/56                                                                                                                               TREATMENT DATE:   11/13/23 Adjusted HEP for core stab- see below. Performed and cued PROM and stretching RT LE esp HS,quad and hip flexor and ABD STW to above     11/06/23 Nustep L 5 PROM and stretching RT LE esp HS,quad and hip flexor and ABD STW to above with and without theragun Stretching and issued for home- supine RT leg off bed hang, clam stretch supine, standing HS and ABD Pelvic ROM on dyna disc, isometric RT hip strengthening ( flex,abd,add,IR,ER)      11/04/23 Walking  laps holding 3# weights  Step ups 6 Walking on beam NuStep L5x63mins Leg ext 10# 2x10 HS curls 25# 2x10 STS holding yellow ball on airex 2x10   10/27/23 Nustep L 5 5 min HS curl 25# 2 sets 10 Knee ext 10# 2 sets 10 Seated row 20# 2 sets 10 Seated resisted trunk ext 2 sets 10 Leg press 30# 2 sets 10. Calf raises 30# 2 sets 10 STS with wt ball chest press 10 x Mech lumbar traction  60 #  10/15/23 Nustep L 5 O2  98 HR 86 STS 5 x 11.5  450 feet 3 min PRE 9/10, O2 100. HR 73 STS with wt ball chest press 10 x 6 inch step up with opp leg ext 10 xBIL with UE 6 inch lateral step up with opp leg abd 10 x BIL with UE HS Curls 25lb 2x10 Leg Ext 10lb 2x10 Seated row 20# 2 sets 10 Seated resisted trunk ext 2 sets 10  10/13/23 Bike L 3 O2  98    HR   80 STS with wt ball press 10 x 2 sets Resisted gait 30# 5 x fwd and back and 3 x each side Leg press 30# 2 sets 10 ( seat on 7, tried 6 but too difficult) 6 in step ups with light HHA 10 x BIL HS Curls 25lb 2x10 Leg Ext 10lb 2x10 6# farmer carry 1 lap each hand Stepping over objects CGA     10/07/23 Bike L 2.5 x 6 min  O2 98%  Goals  Sit to stands holding 2lb 2x10 HS Curls 25lb 2x10 Leg Ext 10lb 2x10 6in step ups x10 each  4in lateral step ups x10 Heel Raises black bar 2x10   10/01/23  Nustep L 2 5 min  O2 after 96 Red tband 2 sets 10 HS curl,LAQ,clams,hip abd and hip flexion STS with 3# chest press 2 sets 5, O2 after 95 Red tband 10 x SL hip flex,ext and abd Feet on ball bridge,KTC and obl  10 x each Iso abdominals with ball 10 x hold 3 sec Resisted gait 3 x 4 ways    09/23/23 EVAL    PATIENT EDUCATION: Education details: POC, HEP Person educated: Patient Education method: Medical Illustrator Education comprehension: verbalized understanding and returned demonstration  HOME EXERCISE PROGRAM:  Access Code: [email or web address] URL: [email or web address] Date: 11/13/2023 Prepared by: Makynlee Kressin  Exercises - Supine Posterior Pelvic Tilt  - 2 x daily - 7 x weekly - 1 sets - 15 reps - 3 hold - Supine Bridge  - 2 x daily - 7 x weekly - 1 sets - 10 reps - 3 hold - Supine March  - 2 x daily - 7 x weekly - 1 sets - 10 reps - Supine Hip Adduction Isometric with Ball  - 2 x daily - 7 x weekly - 1 sets - 10 reps - 3 hold - Hooklying Clamshell with Resistance  - 2 x daily - 7 x weekly - 1 sets - 10 reps - 3 hold - Supine Pelvic Tilt with Straight Leg Raise  - 2 x daily - 7 x weekly - 1 sets - 15 reps - 3 hold     Access Code: [email or web address] URL: [email or web address] Date: 10/01/2023 Prepared by: Doyl Bitting  Exercises - Standing Hip Flexion with Resistance Loop  - 1 x daily - 7 x weekly - 1 sets - 10 reps - Hip Extension with  Resistance Loop  - 1 x daily -  7 x weekly - 1 sets - 10 reps - Hip Abduction with Resistance Loop  - 1 x daily - 7 x weekly - 1 sets - 10 reps - Seated Long Arc Quad  - 1 x daily - 7 x weekly - 1 sets - 10 reps - Seated March  - 1 x daily - 7 x weekly - 1 sets - 10 reps - Seated Hip Abduction with Resistance  - 1 x daily - 7 x weekly - 10 reps  Access Code: [email or web address] URL: [email or web address] Date: 09/23/2023 Prepared by: Almetta Fam  Exercises - Sit to Stand  - 1 x daily - 7 x weekly - 2 sets - 10 reps - Standing Single Leg Stance with Counter Support  - 1 x daily - 7 x weekly - 10 reps - 10 hold - Standing Tandem Balance with Counter Support  - 1 x daily - 7 x weekly - 10 reps - 15 hold  GOALS: Goals reviewed with patient? Yes  SHORT TERM GOALS: Target date: 10/28/23  Patient will be independent with initial HEP. Baseline:  Goal status: 10/01/23 MET  2.  Patient will demonstrate improved functional LE strength as demonstrated by 5xSTS <13s. Baseline: 16s Goal status: 14.60 sec Progressing 10/07/23   MET 10/15/23   LONG TERM GOALS: Target date: 12/02/23  Patient will be independent with advanced/ongoing HEP to improve outcomes and carryover.  Baseline:  Goal status: progressing 10/27/23  and 11/13/23  2.  Patient will be able to ambulate 800' in with less staggering and unsteadiness Baseline: 619ft some SOB and needs SBA Goal status: 10/15/23 450 feet 3 min PRE 9/10. Limited today d/t SOB/cough from virus last week 10/27/23  3.  Patient will score 53 on Berg Balance test to demonstrate lower risk of falls. (MCID= 8 points) .  Baseline: 45 Goal status: INITIAL  4.  Patient will able to return to her normal household chores/routine without SOB and fatigue.  Baseline:  Goal status: progressing 10/15/23  and 10/27/23  5.  Patient will reports decrease in pain in R hip and groin <2/10. Baseline: 7/10 at worst Goal status: ongoing 10/07/23  10/27/23  progressing  and  11/06/23   ASSESSMENT:  CLINICAL IMPRESSION:  pnt arrived feeling better with stretches. Spoke with PCP and wanting to send me to another MD to see if bone,muscle or nerve. Responded well with stretching and we added core stab ex to to see if calms down back    Patient is a 73 y.o. female who was seen today for physical therapy evaluation and treatment for generalized weakness. She has an extensive medical history of heart issues, including a-fib and heart failure. She reports being sick in June and everything went downhill since then. Recent AV node ablation was on 09/15/23. Pt reports difficulty with her normal activities due to fatigue, shortness or breath, and weakness. She reports some staggering and wobbling when walking but she also thinks she has some vision problems that contribute to this. She does have a walker that she uses for longer distances. With functional tests completed today, she does present as a fall risk. Therefore, she will benefit from skilled PT to address weakness, endurance, gait, and balance impairments to decrease risk for fall and help her return to PLOF.  OBJECTIVE IMPAIRMENTS: Abnormal gait, cardiopulmonary status limiting activity, decreased activity tolerance, decreased balance, decreased coordination, decreased endurance, decreased strength, increased edema, and pain.   ACTIVITY LIMITATIONS: carrying, squatting, stairs, and locomotion level  PARTICIPATION  LIMITATIONS: meal prep, cleaning, laundry, shopping, community activity, and yard work  PERSONAL FACTORS: Age, Fitness, and 1-2 comorbidities: a-fib, heart failure are also affecting patient's functional outcome.   REHAB POTENTIAL: Good  CLINICAL DECISION MAKING: Stable/uncomplicated  EVALUATION COMPLEXITY: Low  PLAN:  PT FREQUENCY: 2x/week  PT DURATION: 10 weeks  PLANNED INTERVENTIONS: 97110-Therapeutic exercises, 97530- Therapeutic activity, [id number]- Neuromuscular re-education, 97535-  Self Care, [id number]- Manual therapy, [phone]- Gait training, Patient/Family education, Balance training, Stair training, Cryotherapy, and Moist heat  PLAN FOR NEXT SESSION:assess response to stretching   Siah Kannan,ANGIE, PTA 11/13/2023, 10:55 AM

## 2023-11-13 NOTE — Progress Notes (Signed)
 [facility] Counselor/Therapist Progress Note  Patient ID: Shannon [name], MRN: [id number],    Date: November 13, 2023  Time Spent: 3:01 PM until 3:57 PM, 56 minutes.   This session was held via video teletherapy. The patient consented to the video teletherapy and was located in her home during this session. She is aware it is the responsibility of the patient to secure confidentiality on her end of the session. The provider was in a private home office for the duration of this session.     Reported Symptoms: Anxiety, depression  Mental Status Exam: Appearance:  Well Groomed     Behavior: Appropriate  Motor: Normal  Speech/Language:  Clear and Coherent  Affect: Appropriate for  Mood: normal  Thought process: normal  Thought content:   WNL  Sensory/Perceptual disturbances:   WNL  Orientation: oriented to person, place, time/date, situation, day of week, month of year, and year  Attention: Good  Concentration: Good  Memory: WNL  Fund of knowledge:  Good  Insight:   Good  Judgment:  Good  Impulse Control: Good   Risk Assessment: Danger to Self:  No Self-injurious Behavior: No Danger to Others: No Duty to Warn:no Physical Aggression / Violence:No  Access to Firearms a concern: No  Gang Involvement:No   Subjective: The patient still is not feeling well physically.  Over the past week she has developed some sort of sinus issue which she is fighting.  She is still feeling better with the pain in her legs.  Her physical therapist said already helped some with stretching but when she addressed the pain with her primary care physician they recommended a certain type of physical therapy which she has now started and she is optimistic that will help.  Because she has felt better physically she has been more productive this week.  She has made several soups and frozen them for later use.  She said she had not done that in months because she had not felt like it.  She is  starting to chip away at some of the things in the house they have not gotten put away.  Her daughter is still feeling nauseous from her pregnancy and is not able to help much but knows that we will call her in the near future.  She also feels that she is doing a much better job of letting go of some strong negative feelings toward her husband.  She also has had some clear boundaries with other family members who did not know the full story or who are critical without asking the patient about certain things.  She does contract for safety having no thoughts of hurting herself or anyone else. Interventions: Cognitive Behavioral Therapy  Diagnosis: Adjustment disorder with mixed anxiety and depressed mood.  Plan: I will meet with the patient every 2 weeks via video session TX. Plan:To use cognitive behavioral therapy principles as well as elements of dialectical behavior therapy.  Goals are to reduce anxiety and depression by at least 50% with a target date of November 06, 2022.  Goals are to have less sadness as indicated by PH-9 scores as well as patient report.  We also work on improving mood and return to a healthier level of functioning as defined by her goals for being happy, identify causes and process triggers for depressed mood.  We will use cognitive behavioral therapy to explore and replace unhealthy thoughts and behavior patterns contributing to depression.  I will continue to encourage shearing of  feelings related to the causes and symptoms of depression as well as teach and encouraged use of coping skills for management of depressive symptoms.  We also will work to improve the patient's ability to manage anxiety symptoms and better handle stress, identify causes for anxiety and explore ways for reduction of anxiety in addition to resolving conflicts contributing to anxiety and managing thoughts and worrisome thinking is contributing to anxiety.  Interventions will include providing education about  anxiety, facilitate problem solution skills, teaching coping skills for managing anxiety such as grounding exercises, progressive muscle relaxation and cognitive re framing etc.  We will also use cognitive behavioral therapy to identify and change anxiety provoking thoughts and behavior patterns as well as using DBT distress tolerance and mindfulness skills. I reviewed the treatment plan goals with the patient who agreed to continue with goals as stated above. Progress: 40%New target date will be May 05, 2024 Lorrene CHRISTELLA Hasten, [facility]                                                Lorrene CHRISTELLA Hasten, [facility]               Lorrene CHRISTELLA Hasten, [facility]               Lorrene CHRISTELLA Hasten, [facility]               Lorrene CHRISTELLA Hasten, [facility]               Lorrene CHRISTELLA Hasten, [facility]               Lorrene CHRISTELLA Hasten, [facility]               Lorrene CHRISTELLA Hasten, [facility]               Lorrene CHRISTELLA Hasten, [facility]               Lorrene CHRISTELLA Hasten, [facility]               Lorrene CHRISTELLA Hasten, [facility]               Lorrene CHRISTELLA Hasten, [facility]               Lorrene CHRISTELLA Hasten, [facility]               Lorrene CHRISTELLA Hasten, [facility]               Lorrene CHRISTELLA Hasten, [facility]               Lorrene CHRISTELLA Hasten, [facility]               Lorrene CHRISTELLA Hasten, [facility]               Lorrene CHRISTELLA Hasten, [facility]               Lorrene CHRISTELLA Hasten, [facility]               Lorrene CHRISTELLA Hasten, [facility]               Lorrene CHRISTELLA Hasten, [facility]               Lorrene CHRISTELLA Hasten, [facility]               Lorrene CHRISTELLA Hasten, [facility]  Lorrene CHRISTELLA Hasten, [facility]

## 2023-11-17 ENCOUNTER — Ambulatory Visit

## 2023-11-17 ENCOUNTER — Encounter (INDEPENDENT_AMBULATORY_CARE_PROVIDER_SITE_OTHER): Admitting: Family Medicine

## 2023-11-17 DIAGNOSIS — J0191 Acute recurrent sinusitis, unspecified: Secondary | ICD-10-CM

## 2023-11-17 NOTE — Telephone Encounter (Signed)
 Please see mychart messages sent and advise.

## 2023-11-18 ENCOUNTER — Ambulatory Visit (HOSPITAL_COMMUNITY)

## 2023-11-18 MED ORDER — DOXYCYCLINE HYCLATE 100 MG PO TABS: 100.0000 mg | ORAL_TABLET | Freq: Two times a day (BID) | ORAL | 0 refills | Status: DC

## 2023-11-18 NOTE — Telephone Encounter (Signed)
 Please see the MyChart message reply(ies) for my assessment and plan.    This patient gave consent for this Medical Advice Message and is aware that it may result in a bill to [employer], as well as the possibility of receiving a bill for a co-payment or deductible. They are an established patient, but are not seeking medical advice exclusively about a problem treated during an in person or video visit in the last seven days. I did not recommend an in person or video visit within seven days of my reply.    I spent a total of 6 minutes cumulative time within 7 days through [employer].  Thersia Schuyler Stark, [place]

## 2023-11-18 NOTE — Therapy (Incomplete)
 OUTPATIENT PHYSICAL THERAPY NEURO   Patient Name: Shannon [name] MRN: [id number] DOB:01-23-1950, 73 y.o., female Today's Date: 11/18/2023   PCP: Thersia Stark REFERRING PROVIDER: Toribio Furnace  END OF SESSION:     Past Medical History:  Diagnosis Date   Acute systolic heart failure (HCC) 07/08/2023   Allergy    Anemia    Anxiety    Arthritis 1990's   neck and lower back (03/25/2016)   Asthma 1990s X 1   short term inhaler use    CAD (coronary artery disease)    Cardiac pacemaker in situ    MDT   CHF (congestive heart failure) (HCC)    Chronic lower back pain    COVID-19 03/06/2022   Degenerative disorder of bone    Depression    Diastolic dysfunction    Drug-induced lupus erythematosus    HCTZ induced; still gettin over it (03/25/2016)   Foot swelling 05/19/2022   GERD (gastroesophageal reflux disease) 1990's   Headache, unspecified headache type 03/06/2022   Herniated disc, cervical    Hospital discharge follow-up 03/14/2022   Hyperlipidemia    Hypertension    Neuromuscular disorder (HCC)    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 HTN   Obesity (BMI 30-39.9) 11/07/2022   Orthostatic hypotension    OSA on CPAP    Osteopenia    Osteoporosis 2012   PAF (paroxysmal atrial fibrillation) (HCC)    Paroxysmal atrial fibrillation (HCC) 12/13/2021   Phlebitis after infusion 03/18/2023   Pinched nerve in neck    PND (post-nasal drip) 12/18/2022   Rapid atrial fibrillation (HCC) 07/06/2023   Sinus node dysfunction (HCC)    Sinus pressure 12/18/2022   Sleep apnea 1990's   wears CPAP   Stroke (HCC)    T12 compression fracture (HCC) 11/2015   Vitamin D  deficiency    Whiplash injury 06/07/2010   Past Surgical History:  Procedure Laterality Date   APPENDECTOMY  1990s   ATRIAL FIBRILLATION ABLATION N/A 03/25/2016   Procedure: Atrial Fibrillation Ablation;  Surgeon: Lynwood Rakers, MD;  Location: [facility] INVASIVE CV LAB;  Service: Cardiovascular;   Laterality: N/A;   ATRIAL FIBRILLATION ABLATION N/A 01/31/2020   Procedure: ATRIAL FIBRILLATION ABLATION;  Surgeon: Rakers Lynwood, MD;  Location: [facility] INVASIVE CV LAB;  Service: Cardiovascular;  Laterality: N/A;   ATRIAL FIBRILLATION ABLATION N/A 12/13/2021   Procedure: ATRIAL FIBRILLATION ABLATION;  Surgeon: [name], MD;  Location: [facility] INVASIVE CV LAB;  Service: Cardiovascular;  Laterality: N/A;   AV NODE ABLATION N/A 09/15/2023   Procedure: AV NODE ABLATION;  Surgeon: [name], MD;  Location: [facility] INVASIVE CV LAB;  Service: Cardiovascular;  Laterality: N/A;   CARDIOVERSION N/A 07/09/2023   Procedure: CARDIOVERSION;  Surgeon: Okey Vina GAILS, MD;  Location: [facility] INVASIVE CV LAB;  Service: Cardiovascular;  Laterality: N/A;   COLONOSCOPY     FOREARM FRACTURE SURGERY Left ~ 02/2011   broke arm; shattered wrist   FOREARM HARDWARE REMOVAL Left ~ 07/2011   implantable loop recorder placement  03/07/2019   Medtronic Reveal Linq model LNQ 22 implantable loop recorder ([id number]) implanted by Dr Rakers for Afib management   INSERT / REPLACE / REMOVE PACEMAKER  2025   LOOP RECORDER REMOVAL N/A 03/09/2023   Procedure: LOOP RECORDER REMOVAL;  Surgeon: Kennyth Chew, MD;  Location: [facility] INVASIVE CV LAB;  Service: Cardiovascular;  Laterality: N/A;   PACEMAKER IMPLANT N/A 03/09/2023   Procedure: PACEMAKER IMPLANT;  Surgeon: Kennyth Chew, MD;  Location: [facility] INVASIVE CV  LAB;  Service: Cardiovascular;  Laterality: N/A;   Spinal Nerve Ablation     TEE WITHOUT CARDIOVERSION N/A 03/24/2016   Procedure: TRANSESOPHAGEAL ECHOCARDIOGRAM (TEE);  Surgeon: Jerel Balding, MD;  Location: [facility] ENDOSCOPY;  Service: Cardiovascular;  Laterality: N/A;   Patient Active Problem List   Diagnosis Date Noted   Hypertensive urgency 08/28/2023   Hypokalemia 08/28/2023   Acute on chronic systolic CHF (congestive heart failure) (HCC) 08/27/2023   Chronic obstructive pulmonary disease with acute exacerbation (HCC)  07/14/2023   Chronic HFrEF (heart failure with reduced ejection fraction) (HCC) 07/06/2023   Atrial fibrillation with RVR (HCC) 03/07/2023   History of tachycardia-bradycardia syndrome (HCC) 03/07/2023   Demand ischemia (HCC) 03/07/2023   Current severe episode of major depressive disorder without psychotic features without prior episode (HCC) 03/06/2022   GAD (generalized anxiety disorder) 03/06/2022   Caregiver role strain 03/06/2022   Adjustment disorder with mixed anxiety and depressed mood 03/03/2022   Diplopia 01/15/2022   History of CVA (cerebrovascular accident) 01/15/2022   Thoracic spine pain 11/22/2021   Tinnitus of both ears 11/22/2021   Bilateral lower extremity edema 11/22/2021   Acute CVA (cerebrovascular accident) (HCC) 02/06/2021   Dyslipidemia, goal LDL below 130 07/10/2020   Iron  deficiency anemia 07/10/2020   Shortness of breath 12/23/2019   Asthma 12/23/2019   Morbid obesity (HCC) 09/11/2017   Chronic fatigue 05/27/2016   A-fib (HCC) 03/25/2016   Typical atrial flutter (HCC)    Chronic diastolic CHF (congestive heart failure) (HCC) 12/08/2015   Atrial fibrillation with rapid ventricular response (HCC) 12/08/2015   Thoracic compression fracture (HCC) 11/16/2015   Orthostatic hypotension 11/15/2015   Osteopenia 09/12/2015   Vitamin D  deficiency disease 08/29/2015   Polymyalgia rheumatica 04/18/2015   OSA (obstructive sleep apnea) 03/04/2015   Myalgia 11/22/2014   Primary hypertension 08/21/2014   Anxiety and depression 08/21/2014    ONSET DATE: 08/31/23  REFERRING DIAG:  Mobility status PT Visit Diagnosis: Unsteadiness on feet (R26.81);Muscle weakness (generalized) (M62.81)      THERAPY DIAG:  No diagnosis found.  Rationale for Evaluation and Treatment: Rehabilitation  SUBJECTIVE:                                                                                                                                                                                              SUBJECTIVE STATEMENT: feeling better, stretches really helping. Still pain but better. BP low for some reason  PERTINENT HISTORY: long history of atrial fibrillation with multiple prior catheter ablations, history of heart failure, dyspnea History of CVA  PAIN:  Are you having pain yes down RT leg PRECAUTIONS: None  RED FLAGS: None   WEIGHT BEARING RESTRICTIONS: No  FALLS: Has patient fallen in last 6 months? Yes. Number of falls 1, was moving houses and tripped over a step   LIVING ENVIRONMENT: Lives with: lives with their daughter Lives in: House/apartment Stairs: Yes: Internal: full set but does not go up them steps; on right going up and External: 3 steps; can reach both Has following equipment at home: Vannie - 2 wheeled, Environmental Consultant - 4 wheeled, and Wheelchair (manual)  PLOF: Independent, Independent with basic ADLs, Independent with community mobility without device, and Independent with gait  PATIENT GOALS: whatever you can do to make me better physically   OBJECTIVE:  Note: Objective measures were completed at Evaluation unless otherwise noted.  DIAGNOSTIC FINDINGS: N/A  COGNITION: Overall cognitive status: Within functional limits for tasks assessed   SENSATION: Light touch: some numbness in toes and spreads to feet   COORDINATION: Decrease balance and coordination   POSTURE: rounded shoulders  LOWER EXTREMITY ROM:  WFL BLE, some pain with R hip flexion   LOWER EXTREMITY MMT:    MMT Right Eval Left Eval  Hip flexion 3+ 4  Hip extension    Hip abduction 4+ 5  Hip adduction    Hip internal rotation    Hip external rotation    Knee flexion 5 5  Knee extension 4 4+  Ankle dorsiflexion    Ankle plantarflexion    Ankle inversion    Ankle eversion    (Blank rows = not tested)   STAIRS: Findings: Level of Assistance: Modified independence and Stair Negotiation Technique: Step to Pattern with Bilateral Rails GAIT: Findings: Gait  Characteristics: narrow BOS, poor foot clearance- Right, and poor foot clearance- Left, unstable especially with turns, unsteady on her feet   FUNCTIONAL TESTS:  5 times sit to stand: 16s Timed up and go (TUG): 11.30s 3 minute walk test: 615ft 97%, 80bpm BERG 45/56                                                                                                                               TREATMENT DATE:  11/19/23 NuStep PROM stretching- HS, glute, figure 4, LTR, hip flexor  STW to R leg and groin Feet on pball rotations and knees to chest AB isometric with ball Bridges   11/13/23 Adjusted HEP for core stab- see below. Performed and cued PROM and stretching RT LE esp HS,quad and hip flexor and ABD STW to above     11/06/23 Nustep L 5 PROM and stretching RT LE esp HS,quad and hip flexor and ABD STW to above with and without theragun Stretching and issued for home- supine RT leg off bed hang, clam stretch supine, standing HS and ABD Pelvic ROM on dyna disc, isometric RT hip strengthening ( flex,abd,add,IR,ER)      11/04/23 Walking laps holding 3# weights  Step ups 6 Walking on beam NuStep L5x96mins Leg ext 10# 2x10 HS curls  25# 2x10 STS holding yellow ball on airex 2x10   10/27/23 Nustep L 5 5 min HS curl 25# 2 sets 10 Knee ext 10# 2 sets 10 Seated row 20# 2 sets 10 Seated resisted trunk ext 2 sets 10 Leg press 30# 2 sets 10. Calf raises 30# 2 sets 10 STS with wt ball chest press 10 x Mech lumbar traction  60 #  10/15/23 Nustep L 5 O2  98 HR 86 STS 5 x 11.5  450 feet 3 min PRE 9/10, O2 100. HR 73 STS with wt ball chest press 10 x 6 inch step up with opp leg ext 10 xBIL with UE 6 inch lateral step up with opp leg abd 10 x BIL with UE HS Curls 25lb 2x10 Leg Ext 10lb 2x10 Seated row 20# 2 sets 10 Seated resisted trunk ext 2 sets 10  10/13/23 Bike L 3 O2  98    HR  80 STS with wt ball press 10 x 2 sets Resisted gait 30# 5 x fwd and back  and 3 x each side Leg press 30# 2 sets 10 ( seat on 7, tried 6 but too difficult) 6 in step ups with light HHA 10 x BIL HS Curls 25lb 2x10 Leg Ext 10lb 2x10 6# farmer carry 1 lap each hand Stepping over objects CGA     10/07/23 Bike L 2.5 x 6 min  O2 98%  Goals  Sit to stands holding 2lb 2x10 HS Curls 25lb 2x10 Leg Ext 10lb 2x10 6in step ups x10 each  4in lateral step ups x10 Heel Raises black bar 2x10   10/01/23  Nustep L 2 5 min  O2 after 96 Red tband 2 sets 10 HS curl,LAQ,clams,hip abd and hip flexion STS with 3# chest press 2 sets 5, O2 after 95 Red tband 10 x SL hip flex,ext and abd Feet on ball bridge,KTC and obl  10 x each Iso abdominals with ball 10 x hold 3 sec Resisted gait 3 x 4 ways    09/23/23 EVAL    PATIENT EDUCATION: Education details: POC, HEP Person educated: Patient Education method: Medical Illustrator Education comprehension: verbalized understanding and returned demonstration  HOME EXERCISE PROGRAM:  Access Code: [email or web address] URL: [email or web address] Date: 11/13/2023 Prepared by: Angela Payseur  Exercises - Supine Posterior Pelvic Tilt  - 2 x daily - 7 x weekly - 1 sets - 15 reps - 3 hold - Supine Bridge  - 2 x daily - 7 x weekly - 1 sets - 10 reps - 3 hold - Supine March  - 2 x daily - 7 x weekly - 1 sets - 10 reps - Supine Hip Adduction Isometric with Ball  - 2 x daily - 7 x weekly - 1 sets - 10 reps - 3 hold - Hooklying Clamshell with Resistance  - 2 x daily - 7 x weekly - 1 sets - 10 reps - 3 hold - Supine Pelvic Tilt with Straight Leg Raise  - 2 x daily - 7 x weekly - 1 sets - 15 reps - 3 hold     Access Code: [email or web address] URL: [email or web address] Date: 10/01/2023 Prepared by: Angela Payseur  Exercises - Standing Hip Flexion with Resistance Loop  - 1 x daily - 7 x weekly - 1 sets - 10 reps - Hip Extension with Resistance Loop  - 1 x daily - 7 x weekly - 1 sets - 10 reps - Hip Abduction with  Resistance Loop  - 1  x daily - 7 x weekly - 1 sets - 10 reps - Seated Long Arc Quad  - 1 x daily - 7 x weekly - 1 sets - 10 reps - Seated March  - 1 x daily - 7 x weekly - 1 sets - 10 reps - Seated Hip Abduction with Resistance  - 1 x daily - 7 x weekly - 10 reps  Access Code: [email or web address] URL: [email or web address] Date: 09/23/2023 Prepared by: Almetta Fam  Exercises - Sit to Stand  - 1 x daily - 7 x weekly - 2 sets - 10 reps - Standing Single Leg Stance with Counter Support  - 1 x daily - 7 x weekly - 10 reps - 10 hold - Standing Tandem Balance with Counter Support  - 1 x daily - 7 x weekly - 10 reps - 15 hold  GOALS: Goals reviewed with patient? Yes  SHORT TERM GOALS: Target date: 10/28/23  Patient will be independent with initial HEP. Baseline:  Goal status: 10/01/23 MET  2.  Patient will demonstrate improved functional LE strength as demonstrated by 5xSTS <13s. Baseline: 16s Goal status: 14.60 sec Progressing 10/07/23   MET 10/15/23   LONG TERM GOALS: Target date: 12/02/23  Patient will be independent with advanced/ongoing HEP to improve outcomes and carryover.  Baseline:  Goal status: progressing 10/27/23  and 11/13/23  2.  Patient will be able to ambulate 800' in with less staggering and unsteadiness Baseline: 638ft some SOB and needs SBA Goal status: 10/15/23 450 feet 3 min PRE 9/10. Limited today d/t SOB/cough from virus last week 10/27/23  3.  Patient will score 53 on Berg Balance test to demonstrate lower risk of falls. (MCID= 8 points) .  Baseline: 45 Goal status: INITIAL  4.  Patient will able to return to her normal household chores/routine without SOB and fatigue.  Baseline:  Goal status: progressing 10/15/23  and 10/27/23  5.  Patient will reports decrease in pain in R hip and groin <2/10. Baseline: 7/10 at worst Goal status: ongoing 10/07/23  10/27/23 progressing  and  11/06/23   ASSESSMENT:  CLINICAL IMPRESSION:  pnt arrived feeling  better with stretches. Spoke with PCP and wanting to send me to another MD to see if bone,muscle or nerve. Responded well with stretching and we added core stab ex to to see if calms down back    Patient is a 73 y.o. female who was seen today for physical therapy evaluation and treatment for generalized weakness. She has an extensive medical history of heart issues, including a-fib and heart failure. She reports being sick in June and everything went downhill since then. Recent AV node ablation was on 09/15/23. Pt reports difficulty with her normal activities due to fatigue, shortness or breath, and weakness. She reports some staggering and wobbling when walking but she also thinks she has some vision problems that contribute to this. She does have a walker that she uses for longer distances. With functional tests completed today, she does present as a fall risk. Therefore, she will benefit from skilled PT to address weakness, endurance, gait, and balance impairments to decrease risk for fall and help her return to PLOF.  OBJECTIVE IMPAIRMENTS: Abnormal gait, cardiopulmonary status limiting activity, decreased activity tolerance, decreased balance, decreased coordination, decreased endurance, decreased strength, increased edema, and pain.   ACTIVITY LIMITATIONS: carrying, squatting, stairs, and locomotion level  PARTICIPATION LIMITATIONS: meal prep, cleaning, laundry, shopping, community activity, and yard work  PERSONAL FACTORS: Age, Fitness, and 1-2  comorbidities: a-fib, heart failure are also affecting patient's functional outcome.   REHAB POTENTIAL: Good  CLINICAL DECISION MAKING: Stable/uncomplicated  EVALUATION COMPLEXITY: Low  PLAN:  PT FREQUENCY: 2x/week  PT DURATION: 10 weeks  PLANNED INTERVENTIONS: 97110-Therapeutic exercises, 97530- Therapeutic activity, 97112- Neuromuscular re-education, 97535- Self Care, [id number]- Manual therapy, [phone]- Gait training, Patient/Family education,  Balance training, Stair training, Cryotherapy, and Moist heat  PLAN FOR NEXT SESSION:assess response to stretching   Almetta Fam, PT 11/18/2023, 2:11 PM

## 2023-11-19 ENCOUNTER — Ambulatory Visit

## 2023-11-20 ENCOUNTER — Ambulatory Visit (INDEPENDENT_AMBULATORY_CARE_PROVIDER_SITE_OTHER): Admitting: Behavioral Health

## 2023-11-20 ENCOUNTER — Encounter: Payer: Self-pay | Admitting: Behavioral Health

## 2023-11-20 ENCOUNTER — Telehealth: Payer: Self-pay | Admitting: *Deleted

## 2023-11-20 DIAGNOSIS — F4323 Adjustment disorder with mixed anxiety and depressed mood: Secondary | ICD-10-CM

## 2023-11-20 NOTE — Telephone Encounter (Signed)
 Spoke with patient regarding scheduling lung screening scan due 12/2023. Pt states that she has a lot going on with other doctors right  now and would like another call after the first of the year. Will call back.

## 2023-11-20 NOTE — Progress Notes (Signed)
 [facility] Counselor/Therapist Progress Note  Patient ID: Shannon [name], MRN: [id number],    Date: November 20, 2023  Time Spent: 3:01 PM until 3:57 PM, 56 minutes.   This session was held via video teletherapy. The patient consented to the video teletherapy and was located in her home during this session. She is aware it is the responsibility of the patient to secure confidentiality on her end of the session. The provider was in a private home office for the duration of this session.     Reported Symptoms: Anxiety, depression  Mental Status Exam: Appearance:  Well Groomed     Behavior: Appropriate  Motor: Normal  Speech/Language:  Clear and Coherent  Affect: Appropriate for  Mood: normal  Thought process: normal  Thought content:   WNL  Sensory/Perceptual disturbances:   WNL  Orientation: oriented to person, place, time/date, situation, day of week, month of year, and year  Attention: Good  Concentration: Good  Memory: WNL  Fund of knowledge:  Good  Insight:   Good  Judgment:  Good  Impulse Control: Good   Risk Assessment: Danger to Self:  No Self-injurious Behavior: No Danger to Others: No Duty to Warn:no Physical Aggression / Violence:No  Access to Firearms a concern: No  Gang Involvement:No   Subjective: The patient still is not feeling well physically.  She did start antibiotics 2 days ago and is starting to feel some relief but said it just feels like she has been chronically sick over the past 5 to 6 months and some health issue almost all of this year and she is exhausted.  Thankful that she has a PCP that she finally trust and is very responsive.  Her energy level is not where she wants it to be.  She is spending time with her 72-year-old grandson during the week in the afternoons because he is in daycare in the morning.  She likes having mornings for herself as it has been so filled with doctors appointments recently that she does not feel like  she has had any downtime.  We talked about setting some boundaries and only scheduling doctors appointments all certain days of the week so others are free.  Her 36 year old grandson comes over to spend most weekends with him to give him a break from his family but she says that is becoming exhausting because all day Saturday and Sunday is a bit much and she says he needs to be entertained and she nor her daughter feel physically like doing it that long.  I recommend that she set boundaries to let them come over Saturday night to spend the night to be with him on Sunday to give them Saturday for themselves.  She is trying to get back to doing some things that she wanted to do.  She is rereading the prayer of shape as because she read that previously and got a lot from it.  She is trying to spend some time every day and Bible study and prayer.  Last Saturday and tomorrow she is going to spend a few hours herself out shopping leisurely because she does not have much time for herself and I encouraged her to look for ways to do that more frequently.  Reports she is sleeping well around 6 or 7 hours a night but would like to extend that date but her body right now is waking her up after 6 or 7 hours.  She does contract for safety having no thoughts of hurting  herself or anyone else. Interventions: Cognitive Behavioral Therapy  Diagnosis: Adjustment disorder with mixed anxiety and depressed mood.  Plan: I will meet with the patient every 2 weeks via video session TX. Plan:To use cognitive behavioral therapy principles as well as elements of dialectical behavior therapy.  Goals are to reduce anxiety and depression by at least 50% with a target date of November 06, 2022.  Goals are to have less sadness as indicated by PH-9 scores as well as patient report.  We also work on improving mood and return to a healthier level of functioning as defined by her goals for being happy, identify causes and process triggers for  depressed mood.  We will use cognitive behavioral therapy to explore and replace unhealthy thoughts and behavior patterns contributing to depression.  I will continue to encourage shearing of feelings related to the causes and symptoms of depression as well as teach and encouraged use of coping skills for management of depressive symptoms.  We also will work to improve the patient's ability to manage anxiety symptoms and better handle stress, identify causes for anxiety and explore ways for reduction of anxiety in addition to resolving conflicts contributing to anxiety and managing thoughts and worrisome thinking is contributing to anxiety.  Interventions will include providing education about anxiety, facilitate problem solution skills, teaching coping skills for managing anxiety such as grounding exercises, progressive muscle relaxation and cognitive re framing etc.  We will also use cognitive behavioral therapy to identify and change anxiety provoking thoughts and behavior patterns as well as using DBT distress tolerance and mindfulness skills. I reviewed the treatment plan goals with the patient who agreed to continue with goals as stated above. Progress: 40%New target date will be May 05, 2024 Lorrene CHRISTELLA Hasten, [facility]                                                Lorrene CHRISTELLA Hasten, [facility]               Lorrene CHRISTELLA Hasten, [facility]               Lorrene CHRISTELLA Hasten, [facility]               Lorrene CHRISTELLA Hasten, [facility]               Lorrene CHRISTELLA Hasten, [facility]               Lorrene CHRISTELLA Hasten, [facility]               Lorrene CHRISTELLA Hasten, [facility]               Lorrene CHRISTELLA Hasten, [facility]               Lorrene CHRISTELLA Hasten, [facility]               Lorrene CHRISTELLA Hasten, [facility]               Lorrene CHRISTELLA Hasten, [facility]               Lorrene CHRISTELLA Hasten,  [facility]               Lorrene CHRISTELLA Hasten, [facility]               Lorrene CHRISTELLA Hasten, [facility]  Lorrene CHRISTELLA Hasten, [facility]               Lorrene CHRISTELLA Hasten, [facility]               Lorrene CHRISTELLA Hasten, [facility]               Lorrene CHRISTELLA Hasten, [facility]               Lorrene CHRISTELLA Hasten, [facility]               Lorrene CHRISTELLA Hasten, [facility]               Lorrene CHRISTELLA Hasten, [facility]               Lorrene CHRISTELLA Hasten, [facility]               Lorrene CHRISTELLA Hasten, [facility]               Lorrene CHRISTELLA Hasten, [facility]

## 2023-11-23 ENCOUNTER — Ambulatory Visit (HOSPITAL_COMMUNITY)
Admission: RE | Admit: 2023-11-23 | Discharge: 2023-11-23 | Disposition: A | Discharge status: Home / Self Care | Source: Ambulatory Visit | Attending: Cardiology | Admitting: Cardiology

## 2023-11-23 DIAGNOSIS — I5022 Chronic systolic (congestive) heart failure: Secondary | ICD-10-CM | POA: Diagnosis present

## 2023-11-23 LAB — BASIC METABOLIC PANEL WITH GFR
Anion gap: 12 (ref 5–15)
BUN: 16 mg/dL (ref 8–23)
CO2: 26 mmol/L (ref 22–32)
Calcium: 9.5 mg/dL (ref 8.9–10.3)
Chloride: 103 mmol/L (ref 98–111)
Creatinine, Ser: 0.88 mg/dL (ref 0.44–1.00)
GFR, Estimated: >60 mL/min (ref >60)
Glucose, Bld: 88 mg/dL (ref 70–99)
Potassium: 4.4 mmol/L (ref 3.5–5.1)
Sodium: 141 mmol/L (ref 135–145)

## 2023-11-24 ENCOUNTER — Ambulatory Visit: Admitting: Physical Therapy

## 2023-11-24 DIAGNOSIS — R0602 Shortness of breath: Secondary | ICD-10-CM

## 2023-11-24 DIAGNOSIS — R2681 Unsteadiness on feet: Secondary | ICD-10-CM | POA: Diagnosis not present

## 2023-11-24 DIAGNOSIS — M6281 Muscle weakness (generalized): Secondary | ICD-10-CM

## 2023-11-24 NOTE — Therapy (Signed)
 OUTPATIENT PHYSICAL THERAPY NEURO  Progress Note Reporting Period 09/23/23 to 11/24/23  See note below for Objective Data and Assessment of Progress/Goals.     Patient Name: Shannon [name] MRN: [id number] DOB:1950-12-26, 73 y.o., female Today's Date: 11/24/2023   PCP: Thersia Stark REFERRING PROVIDER: Toribio Furnace  END OF SESSION:  PT End of Session - 11/24/23 1056     Visit Number 10    Date for Recertification  12/02/23    Authorization Type [employer]    PT Start Time 1100    PT Stop Time 1140    PT Time Calculation (min) 40 min           Past Medical History:  Diagnosis Date   Acute systolic heart failure (HCC) 07/08/2023   Allergy    Anemia    Anxiety    Arthritis 1990's   neck and lower back (03/25/2016)   Asthma 1990s X 1   short term inhaler use    CAD (coronary artery disease)    Cardiac pacemaker in situ    MDT   CHF (congestive heart failure) (HCC)    Chronic lower back pain    COVID-19 03/06/2022   Degenerative disorder of bone    Depression    Diastolic dysfunction    Drug-induced lupus erythematosus    HCTZ induced; still gettin over it (03/25/2016)   Foot swelling 05/19/2022   GERD (gastroesophageal reflux disease) 1990's   Headache, unspecified headache type 03/06/2022   Herniated disc, cervical    Hospital discharge follow-up 03/14/2022   Hyperlipidemia    Hypertension    Neuromuscular disorder (HCC)    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 HTN   Obesity (BMI 30-39.9) 11/07/2022   Orthostatic hypotension    OSA on CPAP    Osteopenia    Osteoporosis 2012   PAF (paroxysmal atrial fibrillation) (HCC)    Paroxysmal atrial fibrillation (HCC) 12/13/2021   Phlebitis after infusion 03/18/2023   Pinched nerve in neck    PND (post-nasal drip) 12/18/2022   Rapid atrial fibrillation (HCC) 07/06/2023   Sinus node dysfunction (HCC)    Sinus pressure 12/18/2022   Sleep apnea 1990's   wears CPAP   Stroke (HCC)    T12  compression fracture (HCC) 11/2015   Vitamin D  deficiency    Whiplash injury 06/07/2010   Past Surgical History:  Procedure Laterality Date   APPENDECTOMY  1990s   ATRIAL FIBRILLATION ABLATION N/A 03/25/2016   Procedure: Atrial Fibrillation Ablation;  Surgeon: Lynwood Rakers, MD;  Location[facility] INVASIVE CV LAB;  Service: Cardiovascular;  Laterality: N/A;   ATRIAL FIBRILLATION ABLATION N/A 01/31/2020   Procedure: ATRIAL FIBRILLATION ABLATION;  Surgeon: Rakers Lynwood, MD;  Location: [facility] INVASIVE CV LAB;  Service: Cardiovascular;  Laterality: N/A;   ATRIAL FIBRILLATION ABLATION N/A 12/13/2021   Procedure: ATRIAL FIBRILLATION ABLATION;  Surgeon: [name], MD;  Location: [facility] INVASIVE CV LAB;  Service: Cardiovascular;  Laterality: N/A;   AV NODE ABLATION N/A 09/15/2023   Procedure: AV NODE ABLATION;  Surgeon: [name], MD;  Location: [facility] INVASIVE CV LAB;  Service: Cardiovascular;  Laterality: N/A;   CARDIOVERSION N/A 07/09/2023   Procedure: CARDIOVERSION;  Surgeon: Okey Vina GAILS, MD;  Location: [facility] INVASIVE CV LAB;  Service: Cardiovascular;  Laterality: N/A;   COLONOSCOPY     FOREARM FRACTURE SURGERY Left ~ 02/2011   broke arm; shattered wrist   FOREARM HARDWARE REMOVAL Left ~ 07/2011   implantable loop recorder placement  03/07/2019  Med[employer] Reveal Linq model [id number] 22 implantable loop recorder [id number]) implanted by Dr Kelsie for Afib management   INSERT / REPLACE / REMOVE PACEMAKER  2025   LOOP RECORDER REMOVAL N/A 03/09/2023   Procedure: LOOP RECORDER REMOVAL;  Surgeon: Kennyth Chew, MD;  Location: [facility] INVASIVE CV LAB;  Service: Cardiovascular;  Laterality: N/A;   PACEMAKER IMPLANT N/A 03/09/2023   Procedure: PACEMAKER IMPLANT;  Surgeon: Kennyth Chew, MD;  Location: [facility] INVASIVE CV LAB;  Service: Cardiovascular;  Laterality: N/A;   Spinal Nerve Ablation     TEE WITHOUT CARDIOVERSION N/A 03/24/2016   Procedure: TRANSESOPHAGEAL ECHOCARDIOGRAM (TEE);  Surgeon: Jerel Balding,  MD;  Location: [facility] ENDOSCOPY;  Service: Cardiovascular;  Laterality: N/A;   Patient Active Problem List   Diagnosis Date Noted   Hypertensive urgency 08/28/2023   Hypokalemia 08/28/2023   Acute on chronic systolic CHF (congestive heart failure) (HCC) 08/27/2023   Chronic obstructive pulmonary disease with acute exacerbation (HCC) 07/14/2023   Chronic HFrEF (heart failure with reduced ejection fraction) (HCC) 07/06/2023   Atrial fibrillation with RVR (HCC) 03/07/2023   History of tachycardia-bradycardia syndrome (HCC) 03/07/2023   Demand ischemia (HCC) 03/07/2023   Current severe episode of major depressive disorder without psychotic features without prior episode (HCC) 03/06/2022   GAD (generalized anxiety disorder) 03/06/2022   Caregiver role strain 03/06/2022   Adjustment disorder with mixed anxiety and depressed mood 03/03/2022   Diplopia 01/15/2022   History of CVA (cerebrovascular accident) 01/15/2022   Thoracic spine pain 11/22/2021   Tinnitus of both ears 11/22/2021   Bilateral lower extremity edema 11/22/2021   Acute CVA (cerebrovascular accident) (HCC) 02/06/2021   Dyslipidemia, goal LDL below 130 07/10/2020   Iron  deficiency anemia 07/10/2020   Shortness of breath 12/23/2019   Asthma 12/23/2019   Morbid obesity (HCC) 09/11/2017   Chronic fatigue 05/27/2016   A-fib (HCC) 03/25/2016   Typical atrial flutter (HCC)    Chronic diastolic CHF (congestive heart failure) (HCC) 12/08/2015   Atrial fibrillation with rapid ventricular response (HCC) 12/08/2015   Thoracic compression fracture (HCC) 11/16/2015   Orthostatic hypotension 11/15/2015   Osteopenia 09/12/2015   Vitamin D  deficiency disease 08/29/2015   Polymyalgia rheumatica 04/18/2015   OSA (obstructive sleep apnea) 03/04/2015   Myalgia 11/22/2014   Primary hypertension 08/21/2014   Anxiety and depression 08/21/2014    ONSET DATE: 08/31/23  REFERRING DIAG:  Mobility status PT Visit Diagnosis: Unsteadiness on feet  (R26.81);Muscle weakness (generalized) (M62.81)      THERAPY DIAG:  Gait instability  Muscle weakness (generalized)  Shortness of breath  Rationale for Evaluation and Treatment: Rehabilitation  SUBJECTIVE:  SUBJECTIVE STATEMENT: been sick- doing ex as I could. Still pain in inside RT upper leg- stretching still really helps.75% better overall.  PERTINENT HISTORY: long history of atrial fibrillation with multiple prior catheter ablations, history of heart failure, dyspnea History of CVA  PAIN:  Are you having pain yes down RT leg PRECAUTIONS: None  RED FLAGS: None   WEIGHT BEARING RESTRICTIONS: No  FALLS: Has patient fallen in last 6 months? Yes. Number of falls 1, was moving houses and tripped over a step   LIVING ENVIRONMENT: Lives with: lives with their daughter Lives in: House/apartment Stairs: Yes: Internal: full set but does not go up them steps; on right going up and External: 3 steps; can reach both Has following equipment at home: Vannie - 2 wheeled, Environmental Consultant - 4 wheeled, and Wheelchair (manual)  PLOF: Independent, Independent with basic ADLs, Independent with community mobility without device, and Independent with gait  PATIENT GOALS: whatever you can do to make me better physically   OBJECTIVE:  Note: Objective measures were completed at Evaluation unless otherwise noted.  DIAGNOSTIC FINDINGS: N/A  COGNITION: Overall cognitive status: Within functional limits for tasks assessed   SENSATION: Light touch: some numbness in toes and spreads to feet   COORDINATION: Decrease balance and coordination   POSTURE: rounded shoulders  LOWER EXTREMITY ROM:  WFL BLE, some pain with R hip flexion   LOWER EXTREMITY MMT:    MMT Right Eval Left Eval  Hip flexion 3+ 4  Hip  extension    Hip abduction 4+ 5  Hip adduction    Hip internal rotation    Hip external rotation    Knee flexion 5 5  Knee extension 4 4+  Ankle dorsiflexion    Ankle plantarflexion    Ankle inversion    Ankle eversion    (Blank rows = not tested)   STAIRS: Findings: Level of Assistance: Modified independence and Stair Negotiation Technique: Step to Pattern with Bilateral Rails GAIT: Findings: Gait Characteristics: narrow BOS, poor foot clearance- Right, and poor foot clearance- Left, unstable especially with turns, unsteady on her feet   FUNCTIONAL TESTS:  5 times sit to stand: 16s Timed up and go (TUG): 11.30s 3 minute walk test: 617ft 97%, 80bpm BERG 45/56                                                                                                                               TREATMENT DATE:   11/24/23 Goals assessed and document- BERG and Nustep L 5 Resisted gait 5 x 4 ways 20 # Green tband 2 sets 10 - abd,flex ,IR/ER PROM and stretching RT LE esp HS,quad and hip flexor and ABD STW to above     11/13/23 Adjusted HEP for core stab- see below. Performed and cued PROM and stretching RT LE esp HS,quad and hip flexor and ABD STW to above     11/06/23 Nustep L 5 PROM and  stretching RT LE esp HS,quad and hip flexor and ABD STW to above with and without theragun Stretching and issued for home- supine RT leg off bed hang, clam stretch supine, standing HS and ABD Pelvic ROM on dyna disc, isometric RT hip strengthening ( flex,abd,add,IR,ER)      11/04/23 Walking laps holding 3# weights  Step ups 6 Walking on beam NuStep L5x28mins Leg ext 10# 2x10 HS curls 25# 2x10 STS holding yellow ball on airex 2x10   10/27/23 Nustep L 5 5 min HS curl 25# 2 sets 10 Knee ext 10# 2 sets 10 Seated row 20# 2 sets 10 Seated resisted trunk ext 2 sets 10 Leg press 30# 2 sets 10. Calf raises 30# 2 sets 10 STS with wt ball chest press 10 x Mech lumbar  traction  60 #  10/15/23 Nustep L 5 O2  98 HR 86 STS 5 x 11.5  450 feet 3 min PRE 9/10, O2 100. HR 73 STS with wt ball chest press 10 x 6 inch step up with opp leg ext 10 xBIL with UE 6 inch lateral step up with opp leg abd 10 x BIL with UE HS Curls 25lb 2x10 Leg Ext 10lb 2x10 Seated row 20# 2 sets 10 Seated resisted trunk ext 2 sets 10  10/13/23 Bike L 3 O2  98    HR  80 STS with wt ball press 10 x 2 sets Resisted gait 30# 5 x fwd and back and 3 x each side Leg press 30# 2 sets 10 ( seat on 7, tried 6 but too difficult) 6 in step ups with light HHA 10 x BIL HS Curls 25lb 2x10 Leg Ext 10lb 2x10 6# farmer carry 1 lap each hand Stepping over objects CGA     10/07/23 Bike L 2.5 x 6 min  O2 98%  Goals  Sit to stands holding 2lb 2x10 HS Curls 25lb 2x10 Leg Ext 10lb 2x10 6in step ups x10 each  4in lateral step ups x10 Heel Raises black bar 2x10   10/01/23  Nustep L 2 5 min  O2 after 96 Red tband 2 sets 10 HS curl,LAQ,clams,hip abd and hip flexion STS with 3# chest press 2 sets 5, O2 after 95 Red tband 10 x SL hip flex,ext and abd Feet on ball bridge,KTC and obl  10 x each Iso abdominals with ball 10 x hold 3 sec Resisted gait 3 x 4 ways    09/23/23 EVAL    PATIENT EDUCATION: Education details: POC, HEP Person educated: Patient Education method: Medical Illustrator Education comprehension: verbalized understanding and returned demonstration  HOME EXERCISE PROGRAM:  Access Code: [email or web address] URL: [email or web address] Date: 11/13/2023 Prepared by: Brynlee Pennywell  Exercises - Supine Posterior Pelvic Tilt  - 2 x daily - 7 x weekly - 1 sets - 15 reps - 3 hold - Supine Bridge  - 2 x daily - 7 x weekly - 1 sets - 10 reps - 3 hold - Supine March  - 2 x daily - 7 x weekly - 1 sets - 10 reps - Supine Hip Adduction Isometric with Ball  - 2 x daily - 7 x weekly - 1 sets - 10 reps - 3 hold - Hooklying Clamshell with Resistance  - 2 x daily  - 7 x weekly - 1 sets - 10 reps - 3 hold - Supine Pelvic Tilt with Straight Leg Raise  - 2 x daily - 7 x weekly - 1 sets - 15 reps -  3 hold     Access Code: [email or web address] URL: [email or web address] Date: 10/01/2023 Prepared by: Jomo Forand  Exercises - Standing Hip Flexion with Resistance Loop  - 1 x daily - 7 x weekly - 1 sets - 10 reps - Hip Extension with Resistance Loop  - 1 x daily - 7 x weekly - 1 sets - 10 reps - Hip Abduction with Resistance Loop  - 1 x daily - 7 x weekly - 1 sets - 10 reps - Seated Long Arc Quad  - 1 x daily - 7 x weekly - 1 sets - 10 reps - Seated March  - 1 x daily - 7 x weekly - 1 sets - 10 reps - Seated Hip Abduction with Resistance  - 1 x daily - 7 x weekly - 10 reps  Access Code: [email or web address] URL: [email or web address] Date: 09/23/2023 Prepared by: Almetta Fam  Exercises - Sit to Stand  - 1 x daily - 7 x weekly - 2 sets - 10 reps - Standing Single Leg Stance with Counter Support  - 1 x daily - 7 x weekly - 10 reps - 10 hold - Standing Tandem Balance with Counter Support  - 1 x daily - 7 x weekly - 10 reps - 15 hold  GOALS: Goals reviewed with patient? Yes  SHORT TERM GOALS: Target date: 10/28/23  Patient will be independent with initial HEP. Baseline:  Goal status: 10/01/23 MET  2.  Patient will demonstrate improved functional LE strength as demonstrated by 5xSTS <13s. Baseline: 16s Goal status: 14.60 sec Progressing 10/07/23   MET 10/15/23   LONG TERM GOALS: Target date: 12/02/23  Patient will be independent with advanced/ongoing HEP to improve outcomes and carryover.  Baseline:  Goal status: progressing 10/27/23  and 11/13/23   11/24/23 progressing  2.  Patient will be able to ambulate 800' in with less staggering and unsteadiness Baseline: 617ft some SOB and needs SBA Goal status: 10/15/23 450 feet 3 min PRE 9/10. Limited today d/t SOB/cough from virus last week 10/27/23.  11/24/23 4 1/2 mins SOB and PRE  8/10, no unsteadiness but fatigue MET  3.  Patient will score 53 on Berg Balance test to demonstrate lower risk of falls. (MCID= 8 points) .  Baseline: 45 Goal status: 11/24/23 50/56  4.  Patient will able to return to her normal household chores/routine without SOB and fatigue.  Baseline:  Goal status: progressing 10/15/23  and 10/27/23  5.  Patient will reports decrease in pain in R hip and groin <2/10. Baseline: 7/10 at worst Goal status: ongoing 10/07/23  10/27/23 progressing  and  11/06/23  11/24/23 baseline better but pain spikes with actvities  ASSESSMENT:  CLINICAL IMPRESSION:   Pnt returns after being sick for a couple weeks. Overall states pain is 75% better and stretching really helps. States hips still feel very weak and at times with ex and or actvity RT hip girdle pain spikes up. Gait distance goal met but PRE 8/10 after 4 1/2 min. BERG increase to 50 from 45, goal 53 so decreased fall risk. Pnt will continue to benefit form skilled therapy to address limitations.  Patient is a 73 y.o. female who was seen today for physical therapy evaluation and treatment for generalized weakness. She has an extensive medical history of heart issues, including a-fib and heart failure. She reports being sick in June and everything went downhill since then. Recent AV node ablation was on 09/15/23. Pt reports difficulty with her normal activities due to  fatigue, shortness or breath, and weakness. She reports some staggering and wobbling when walking but she also thinks she has some vision problems that contribute to this. She does have a walker that she uses for longer distances. With functional tests completed today, she does present as a fall risk. Therefore, she will benefit from skilled PT to address weakness, endurance, gait, and balance impairments to decrease risk for fall and help her return to PLOF.  OBJECTIVE IMPAIRMENTS: Abnormal gait, cardiopulmonary status limiting activity, decreased  activity tolerance, decreased balance, decreased coordination, decreased endurance, decreased strength, increased edema, and pain.   ACTIVITY LIMITATIONS: carrying, squatting, stairs, and locomotion level  PARTICIPATION LIMITATIONS: meal prep, cleaning, laundry, shopping, community activity, and yard work  PERSONAL FACTORS: Age, Fitness, and 1-2 comorbidities: a-fib, heart failure are also affecting patient's functional outcome.   REHAB POTENTIAL: Good  CLINICAL DECISION MAKING: Stable/uncomplicated  EVALUATION COMPLEXITY: Low  PLAN:  PT FREQUENCY: 2x/week  PT DURATION: 10 weeks  PLANNED INTERVENTIONS: 97110-Therapeutic exercises, 97530- Therapeutic activity, [id number]- Neuromuscular re-education, 97535- Self Care, [id number]- Manual therapy, [phone]- Gait training, Patient/Family education, Balance training, Stair training, Cryotherapy, and Moist heat  PLAN FOR NEXT SESSION:assess response to stretching   Dayelin Balducci,ANGIE, PTA 11/24/2023, 11:32 AM

## 2023-11-25 NOTE — Therapy (Signed)
 OUTPATIENT PHYSICAL THERAPY NEURO  Progress Note Reporting Period 09/23/23 to 11/24/23  See note below for Objective Data and Assessment of Progress/Goals.     Patient Name: Shannon [name] MRN: [id number] DOB:1950/12/06, 73 y.o., female Today's Date: 11/25/2023   PCP: Thersia Stark REFERRING PROVIDER: Toribio Furnace  END OF SESSION:     Past Medical History:  Diagnosis Date   Acute systolic heart failure (HCC) 07/08/2023   Allergy    Anemia    Anxiety    Arthritis 1990's   neck and lower back (03/25/2016)   Asthma 1990s X 1   short term inhaler use    CAD (coronary artery disease)    Cardiac pacemaker in situ    MDT   CHF (congestive heart failure) (HCC)    Chronic lower back pain    COVID-19 03/06/2022   Degenerative disorder of bone    Depression    Diastolic dysfunction    Drug-induced lupus erythematosus    HCTZ induced; still gettin over it (03/25/2016)   Foot swelling 05/19/2022   GERD (gastroesophageal reflux disease) 1990's   Headache, unspecified headache type 03/06/2022   Herniated disc, cervical    Hospital discharge follow-up 03/14/2022   Hyperlipidemia    Hypertension    Neuromuscular disorder (HCC)    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 HTN   Obesity (BMI 30-39.9) 11/07/2022   Orthostatic hypotension    OSA on CPAP    Osteopenia    Osteoporosis 2012   PAF (paroxysmal atrial fibrillation) (HCC)    Paroxysmal atrial fibrillation (HCC) 12/13/2021   Phlebitis after infusion 03/18/2023   Pinched nerve in neck    PND (post-nasal drip) 12/18/2022   Rapid atrial fibrillation (HCC) 07/06/2023   Sinus node dysfunction (HCC)    Sinus pressure 12/18/2022   Sleep apnea 1990's   wears CPAP   Stroke (HCC)    T12 compression fracture (HCC) 11/2015   Vitamin D  deficiency    Whiplash injury 06/07/2010   Past Surgical History:  Procedure Laterality Date   APPENDECTOMY  1990s   ATRIAL FIBRILLATION ABLATION N/A 03/25/2016    Procedure: Atrial Fibrillation Ablation;  Surgeon: Lynwood Rakers, MD;  Location: [facility] INVASIVE CV LAB;  Service: Cardiovascular;  Laterality: N/A;   ATRIAL FIBRILLATION ABLATION N/A 01/31/2020   Procedure: ATRIAL FIBRILLATION ABLATION;  Surgeon: Rakers Lynwood, MD;  Location: [facility] INVASIVE CV LAB;  Service: Cardiovascular;  Laterality: N/A;   ATRIAL FIBRILLATION ABLATION N/A 12/13/2021   Procedure: ATRIAL FIBRILLATION ABLATION;  Surgeon: [name], MD;  Location: [facility] INVASIVE CV LAB;  Service: Cardiovascular;  Laterality: N/A;   AV NODE ABLATION N/A 09/15/2023   Procedure: AV NODE ABLATION;  Surgeon: [name], MD;  Location: [facility] INVASIVE CV LAB;  Service: Cardiovascular;  Laterality: N/A;   CARDIOVERSION N/A 07/09/2023   Procedure: CARDIOVERSION;  Surgeon: Okey Vina GAILS, MD;  Location: [facility] INVASIVE CV LAB;  Service: Cardiovascular;  Laterality: N/A;   COLONOSCOPY     FOREARM FRACTURE SURGERY Left ~ 02/2011   broke arm; shattered wrist   FOREARM HARDWARE REMOVAL Left ~ 07/2011   implantable loop recorder placement  03/07/2019   Medtronic Reveal Linq model LNQ 22 implantable loop recorder ([id number]) implanted by Dr Rakers for Afib management   INSERT / REPLACE / REMOVE PACEMAKER  2025   LOOP RECORDER REMOVAL N/A 03/09/2023   Procedure: LOOP RECORDER REMOVAL;  Surgeon: Kennyth Chew, MD;  Location: [facility] INVASIVE CV LAB;  Service: Cardiovascular;  Laterality: N/A;  PACEMAKER IMPLANT N/A 03/09/2023   Procedure: PACEMAKER IMPLANT;  Surgeon: Kennyth Chew, MD;  Location: [facility] INVASIVE CV LAB;  Service: Cardiovascular;  Laterality: N/A;   Spinal Nerve Ablation     TEE WITHOUT CARDIOVERSION N/A 03/24/2016   Procedure: TRANSESOPHAGEAL ECHOCARDIOGRAM (TEE);  Surgeon: Jerel Balding, MD;  Location: [facility] ENDOSCOPY;  Service: Cardiovascular;  Laterality: N/A;   Patient Active Problem List   Diagnosis Date Noted   Hypertensive urgency 08/28/2023   Hypokalemia 08/28/2023   Acute on chronic  systolic CHF (congestive heart failure) (HCC) 08/27/2023   Chronic obstructive pulmonary disease with acute exacerbation (HCC) 07/14/2023   Chronic HFrEF (heart failure with reduced ejection fraction) (HCC) 07/06/2023   Atrial fibrillation with RVR (HCC) 03/07/2023   History of tachycardia-bradycardia syndrome (HCC) 03/07/2023   Demand ischemia (HCC) 03/07/2023   Current severe episode of major depressive disorder without psychotic features without prior episode (HCC) 03/06/2022   GAD (generalized anxiety disorder) 03/06/2022   Caregiver role strain 03/06/2022   Adjustment disorder with mixed anxiety and depressed mood 03/03/2022   Diplopia 01/15/2022   History of CVA (cerebrovascular accident) 01/15/2022   Thoracic spine pain 11/22/2021   Tinnitus of both ears 11/22/2021   Bilateral lower extremity edema 11/22/2021   Acute CVA (cerebrovascular accident) (HCC) 02/06/2021   Dyslipidemia, goal LDL below 130 07/10/2020   Iron  deficiency anemia 07/10/2020   Shortness of breath 12/23/2019   Asthma 12/23/2019   Morbid obesity (HCC) 09/11/2017   Chronic fatigue 05/27/2016   A-fib (HCC) 03/25/2016   Typical atrial flutter (HCC)    Chronic diastolic CHF (congestive heart failure) (HCC) 12/08/2015   Atrial fibrillation with rapid ventricular response (HCC) 12/08/2015   Thoracic compression fracture (HCC) 11/16/2015   Orthostatic hypotension 11/15/2015   Osteopenia 09/12/2015   Vitamin D  deficiency disease 08/29/2015   Polymyalgia rheumatica 04/18/2015   OSA (obstructive sleep apnea) 03/04/2015   Myalgia 11/22/2014   Primary hypertension 08/21/2014   Anxiety and depression 08/21/2014    ONSET DATE: 08/31/23  REFERRING DIAG:  Mobility status PT Visit Diagnosis: Unsteadiness on feet (R26.81);Muscle weakness (generalized) (M62.81)      THERAPY DIAG:  No diagnosis found.  Rationale for Evaluation and Treatment: Rehabilitation  SUBJECTIVE:                                                                                                                                                                                              SUBJECTIVE STATEMENT: been sick- doing ex as I could. Still pain in inside RT upper leg- stretching still really helps.75% better overall.  PERTINENT HISTORY: long history of atrial fibrillation with multiple  prior catheter ablations, history of heart failure, dyspnea History of CVA  PAIN:  Are you having pain yes down RT leg PRECAUTIONS: None  RED FLAGS: None   WEIGHT BEARING RESTRICTIONS: No  FALLS: Has patient fallen in last 6 months? Yes. Number of falls 1, was moving houses and tripped over a step   LIVING ENVIRONMENT: Lives with: lives with their daughter Lives in: House/apartment Stairs: Yes: Internal: full set but does not go up them steps; on right going up and External: 3 steps; can reach both Has following equipment at home: Vannie - 2 wheeled, Environmental Consultant - 4 wheeled, and Wheelchair (manual)  PLOF: Independent, Independent with basic ADLs, Independent with community mobility without device, and Independent with gait  PATIENT GOALS: whatever you can do to make me better physically   OBJECTIVE:  Note: Objective measures were completed at Evaluation unless otherwise noted.  DIAGNOSTIC FINDINGS: N/A  COGNITION: Overall cognitive status: Within functional limits for tasks assessed   SENSATION: Light touch: some numbness in toes and spreads to feet   COORDINATION: Decrease balance and coordination   POSTURE: rounded shoulders  LOWER EXTREMITY ROM:  WFL BLE, some pain with R hip flexion   LOWER EXTREMITY MMT:    MMT Right Eval Left Eval  Hip flexion 3+ 4  Hip extension    Hip abduction 4+ 5  Hip adduction    Hip internal rotation    Hip external rotation    Knee flexion 5 5  Knee extension 4 4+  Ankle dorsiflexion    Ankle plantarflexion    Ankle inversion    Ankle eversion    (Blank rows = not  tested)   STAIRS: Findings: Level of Assistance: Modified independence and Stair Negotiation Technique: Step to Pattern with Bilateral Rails GAIT: Findings: Gait Characteristics: narrow BOS, poor foot clearance- Right, and poor foot clearance- Left, unstable especially with turns, unsteady on her feet   FUNCTIONAL TESTS:  5 times sit to stand: 16s Timed up and go (TUG): 11.30s 3 minute walk test: 673ft 97%, 80bpm BERG 45/56                                                                                                                               TREATMENT DATE:  11/25/23 NuStep PROM stretching- HS, glute, figure 4, LTR, hip flexor  STW to R leg and groin Feet on pball rotations and knees to chest AB isometric with ball Bridges  11/24/23 Goals assessed and document- BERG and Nustep L 5 Resisted gait 5 x 4 ways 20 # Green tband 2 sets 10 - abd,flex ,IR/ER PROM and stretching RT LE esp HS,quad and hip flexor and ABD STW to above     11/13/23 Adjusted HEP for core stab- see below. Performed and cued PROM and stretching RT LE esp HS,quad and hip flexor and ABD STW to above     11/06/23 Nustep L 5 PROM and stretching  RT LE esp HS,quad and hip flexor and ABD STW to above with and without theragun Stretching and issued for home- supine RT leg off bed hang, clam stretch supine, standing HS and ABD Pelvic ROM on dyna disc, isometric RT hip strengthening ( flex,abd,add,IR,ER)      11/04/23 Walking laps holding 3# weights  Step ups 6 Walking on beam NuStep L5x59mins Leg ext 10# 2x10 HS curls 25# 2x10 STS holding yellow ball on airex 2x10   10/27/23 Nustep L 5 5 min HS curl 25# 2 sets 10 Knee ext 10# 2 sets 10 Seated row 20# 2 sets 10 Seated resisted trunk ext 2 sets 10 Leg press 30# 2 sets 10. Calf raises 30# 2 sets 10 STS with wt ball chest press 10 x Mech lumbar traction  60 #  10/15/23 Nustep L 5 O2  98 HR 86 STS 5 x 11.5  450  feet 3 min PRE 9/10, O2 100. HR 73 STS with wt ball chest press 10 x 6 inch step up with opp leg ext 10 xBIL with UE 6 inch lateral step up with opp leg abd 10 x BIL with UE HS Curls 25lb 2x10 Leg Ext 10lb 2x10 Seated row 20# 2 sets 10 Seated resisted trunk ext 2 sets 10  10/13/23 Bike L 3 O2  98    HR  80 STS with wt ball press 10 x 2 sets Resisted gait 30# 5 x fwd and back and 3 x each side Leg press 30# 2 sets 10 ( seat on 7, tried 6 but too difficult) 6 in step ups with light HHA 10 x BIL HS Curls 25lb 2x10 Leg Ext 10lb 2x10 6# farmer carry 1 lap each hand Stepping over objects CGA     10/07/23 Bike L 2.5 x 6 min  O2 98%  Goals  Sit to stands holding 2lb 2x10 HS Curls 25lb 2x10 Leg Ext 10lb 2x10 6in step ups x10 each  4in lateral step ups x10 Heel Raises black bar 2x10   10/01/23  Nustep L 2 5 min  O2 after 96 Red tband 2 sets 10 HS curl,LAQ,clams,hip abd and hip flexion STS with 3# chest press 2 sets 5, O2 after 95 Red tband 10 x SL hip flex,ext and abd Feet on ball bridge,KTC and obl  10 x each Iso abdominals with ball 10 x hold 3 sec Resisted gait 3 x 4 ways    09/23/23 EVAL    PATIENT EDUCATION: Education details: POC, HEP Person educated: Patient Education method: Medical Illustrator Education comprehension: verbalized understanding and returned demonstration  HOME EXERCISE PROGRAM:  Access Code: [email or web address] URL: [email or web address] Date: 11/13/2023 Prepared by: Angela Payseur  Exercises - Supine Posterior Pelvic Tilt  - 2 x daily - 7 x weekly - 1 sets - 15 reps - 3 hold - Supine Bridge  - 2 x daily - 7 x weekly - 1 sets - 10 reps - 3 hold - Supine March  - 2 x daily - 7 x weekly - 1 sets - 10 reps - Supine Hip Adduction Isometric with Ball  - 2 x daily - 7 x weekly - 1 sets - 10 reps - 3 hold - Hooklying Clamshell with Resistance  - 2 x daily - 7 x weekly - 1 sets - 10 reps - 3 hold - Supine Pelvic Tilt with  Straight Leg Raise  - 2 x daily - 7 x weekly - 1 sets - 15 reps -  3 hold     Access Code: [email or web address] URL: [email or web address] Date: 10/01/2023 Prepared by: Angela Payseur  Exercises - Standing Hip Flexion with Resistance Loop  - 1 x daily - 7 x weekly - 1 sets - 10 reps - Hip Extension with Resistance Loop  - 1 x daily - 7 x weekly - 1 sets - 10 reps - Hip Abduction with Resistance Loop  - 1 x daily - 7 x weekly - 1 sets - 10 reps - Seated Long Arc Quad  - 1 x daily - 7 x weekly - 1 sets - 10 reps - Seated March  - 1 x daily - 7 x weekly - 1 sets - 10 reps - Seated Hip Abduction with Resistance  - 1 x daily - 7 x weekly - 10 reps  Access Code: [email or web address] URL: [email or web address] Date: 09/23/2023 Prepared by: Almetta Fam  Exercises - Sit to Stand  - 1 x daily - 7 x weekly - 2 sets - 10 reps - Standing Single Leg Stance with Counter Support  - 1 x daily - 7 x weekly - 10 reps - 10 hold - Standing Tandem Balance with Counter Support  - 1 x daily - 7 x weekly - 10 reps - 15 hold  GOALS: Goals reviewed with patient? Yes  SHORT TERM GOALS: Target date: 10/28/23  Patient will be independent with initial HEP. Baseline:  Goal status: 10/01/23 MET  2.  Patient will demonstrate improved functional LE strength as demonstrated by 5xSTS <13s. Baseline: 16s Goal status: 14.60 sec Progressing 10/07/23   MET 10/15/23   LONG TERM GOALS: Target date: 12/02/23  Patient will be independent with advanced/ongoing HEP to improve outcomes and carryover.  Baseline:  Goal status: progressing 10/27/23  and 11/13/23   11/24/23 progressing  2.  Patient will be able to ambulate 800' in with less staggering and unsteadiness Baseline: 619ft some SOB and needs SBA Goal status: 10/15/23 450 feet 3 min PRE 9/10. Limited today d/t SOB/cough from virus last week 10/27/23.  11/24/23 4 1/2 mins SOB and PRE 8/10, no unsteadiness but fatigue MET  3.  Patient will score 53 on  Berg Balance test to demonstrate lower risk of falls. (MCID= 8 points) .  Baseline: 45 Goal status: 11/24/23 50/56  4.  Patient will able to return to her normal household chores/routine without SOB and fatigue.  Baseline:  Goal status: progressing 10/15/23  and 10/27/23  5.  Patient will reports decrease in pain in R hip and groin <2/10. Baseline: 7/10 at worst Goal status: ongoing 10/07/23  10/27/23 progressing  and  11/06/23  11/24/23 baseline better but pain spikes with actvities  ASSESSMENT:  CLINICAL IMPRESSION:  RECERT**   Pnt returns after being sick for a couple weeks. Overall states pain is 75% better and stretching really helps. States hips still feel very weak and at times with ex and or actvity RT hip girdle pain spikes up. Gait distance goal met but PRE 8/10 after 4 1/2 min. BERG increase to 50 from 45, goal 53 so decreased fall risk. Pnt will continue to benefit form skilled therapy to address limitations.  Patient is a 73 y.o. female who was seen today for physical therapy evaluation and treatment for generalized weakness. She has an extensive medical history of heart issues, including a-fib and heart failure. She reports being sick in June and everything went downhill since then. Recent AV node ablation was on 09/15/23. Pt reports difficulty with her normal activities  due to fatigue, shortness or breath, and weakness. She reports some staggering and wobbling when walking but she also thinks she has some vision problems that contribute to this. She does have a walker that she uses for longer distances. With functional tests completed today, she does present as a fall risk. Therefore, she will benefit from skilled PT to address weakness, endurance, gait, and balance impairments to decrease risk for fall and help her return to PLOF.  OBJECTIVE IMPAIRMENTS: Abnormal gait, cardiopulmonary status limiting activity, decreased activity tolerance, decreased balance, decreased coordination,  decreased endurance, decreased strength, increased edema, and pain.   ACTIVITY LIMITATIONS: carrying, squatting, stairs, and locomotion level  PARTICIPATION LIMITATIONS: meal prep, cleaning, laundry, shopping, community activity, and yard work  PERSONAL FACTORS: Age, Fitness, and 1-2 comorbidities: a-fib, heart failure are also affecting patient's functional outcome.   REHAB POTENTIAL: Good  CLINICAL DECISION MAKING: Stable/uncomplicated  EVALUATION COMPLEXITY: Low  PLAN:  PT FREQUENCY: 2x/week  PT DURATION: 10 weeks  PLANNED INTERVENTIONS: 97110-Therapeutic exercises, 97530- Therapeutic activity, 97112- Neuromuscular re-education, 97535- Self Care, [id number]- Manual therapy, [phone]- Gait training, Patient/Family education, Balance training, Stair training, Cryotherapy, and Moist heat  PLAN FOR NEXT SESSION:assess response to stretching   Almetta Fam, PT 11/25/2023, 10:38 AM

## 2023-11-26 ENCOUNTER — Ambulatory Visit

## 2023-11-26 ENCOUNTER — Encounter: Payer: Self-pay | Admitting: Cardiology

## 2023-11-26 DIAGNOSIS — M6281 Muscle weakness (generalized): Secondary | ICD-10-CM

## 2023-11-26 DIAGNOSIS — R2689 Other abnormalities of gait and mobility: Secondary | ICD-10-CM

## 2023-11-26 DIAGNOSIS — R2681 Unsteadiness on feet: Secondary | ICD-10-CM | POA: Diagnosis not present

## 2023-11-27 ENCOUNTER — Encounter: Payer: Self-pay | Admitting: Behavioral Health

## 2023-11-27 ENCOUNTER — Ambulatory Visit: Admitting: Behavioral Health

## 2023-11-27 DIAGNOSIS — F4323 Adjustment disorder with mixed anxiety and depressed mood: Secondary | ICD-10-CM

## 2023-11-27 NOTE — Progress Notes (Signed)
 [facility] Counselor/Therapist Progress Note  Patient ID: Shannon [name], MRN: [id number],    Date: November 27, 2023  Time Spent: 3:01 PM until 3:57 PM, 56 minutes.   This session was held via video teletherapy. The patient consented to the video teletherapy and was located in her home during this session. She is aware it is the responsibility of the patient to secure confidentiality on her end of the session. The provider was in a private home office for the duration of this session.     Reported Symptoms: Anxiety, depression  Mental Status Exam: Appearance:  Well Groomed     Behavior: Appropriate  Motor: Normal  Speech/Language:  Clear and Coherent  Affect: Appropriate for  Mood: normal  Thought process: normal  Thought content:   WNL  Sensory/Perceptual disturbances:   WNL  Orientation: oriented to person, place, time/date, situation, day of week, month of year, and year  Attention: Good  Concentration: Good  Memory: WNL  Fund of knowledge:  Good  Insight:   Good  Judgment:  Good  Impulse Control: Good   Risk Assessment: Danger to Self:  No Self-injurious Behavior: No Danger to Others: No Duty to Warn:no Physical Aggression / Violence:No  Access to Firearms a concern: No  Gang Involvement:No   Subjective: The patient does feel better but still has not completely shaking the virus.  She does state she has somewhat more energy and is sleeping a little bit better.  She continues in physical therapy for the issues with her legs and says during therapy and for short-term after it helps but after that it goes back to the same level of pain.  She is not sure what the next step is but they have talked about a nerve block, they have talked about orthopedic referral and they have talked about physical therapy for her heart after all this to.  She says she is just trying to compartmentalize and take things day-to-day.  She is getting some things done around the  house that she has energy her pain level allows.  There are some positives coming up.  Her daughter and grandson will be out of town from Monday to Wednesday so she will have a few days on her want to get some things done that she needs to wants to.  Her daughters told her to decide what she would like to do for Thanksgiving so they are going to get out of town for a couple of days over the Thanksgiving weekend.  I applauded her continued efforts at getting better and encouraged her to continue staying focused on the progress that she is making. She does contract for safety having no thoughts of hurting herself or anyone else. Interventions: Cognitive Behavioral Therapy  Diagnosis: Adjustment disorder with mixed anxiety and depressed mood.  Plan: I will meet with the patient every 2 weeks via video session TX. Plan:To use cognitive behavioral therapy principles as well as elements of dialectical behavior therapy.  Goals are to reduce anxiety and depression by at least 50% with a target date of November 06, 2022.  Goals are to have less sadness as indicated by PH-9 scores as well as patient report.  We also work on improving mood and return to a healthier level of functioning as defined by her goals for being happy, identify causes and process triggers for depressed mood.  We will use cognitive behavioral therapy to explore and replace unhealthy thoughts and behavior patterns contributing to depression.  I  will continue to encourage shearing of feelings related to the causes and symptoms of depression as well as teach and encouraged use of coping skills for management of depressive symptoms.  We also will work to improve the patient's ability to manage anxiety symptoms and better handle stress, identify causes for anxiety and explore ways for reduction of anxiety in addition to resolving conflicts contributing to anxiety and managing thoughts and worrisome thinking is contributing to anxiety.  Interventions  will include providing education about anxiety, facilitate problem solution skills, teaching coping skills for managing anxiety such as grounding exercises, progressive muscle relaxation and cognitive re framing etc.  We will also use cognitive behavioral therapy to identify and change anxiety provoking thoughts and behavior patterns as well as using DBT distress tolerance and mindfulness skills. I reviewed the treatment plan goals with the patient who agreed to continue with goals as stated above. Progress: 40%New target date will be May 05, 2024 Lorrene CHRISTELLA Hasten, [facility]                                                Lorrene CHRISTELLA Hasten, [facility]               Lorrene CHRISTELLA Hasten, [facility]               Lorrene CHRISTELLA Hasten, [facility]               Lorrene CHRISTELLA Hasten, [facility]               Lorrene CHRISTELLA Hasten, [facility]               Lorrene CHRISTELLA Hasten, [facility]               Lorrene CHRISTELLA Hasten, [facility]               Lorrene CHRISTELLA Hasten, [facility]               Lorrene CHRISTELLA Hasten, [facility]               Lorrene CHRISTELLA Hasten, [facility]               Lorrene CHRISTELLA Hasten, [facility]               Lorrene CHRISTELLA Hasten, [facility]               Lorrene CHRISTELLA Hasten, [facility]               Lorrene CHRISTELLA Hasten, [facility]               Lorrene CHRISTELLA Hasten, [facility]               Lorrene CHRISTELLA Hasten, [facility]               Lorrene CHRISTELLA Hasten, [facility]               Lorrene CHRISTELLA Hasten, [facility]               Lorrene CHRISTELLA Hasten, [facility]               Lorrene CHRISTELLA Hasten, [facility]               Lorrene CHRISTELLA Hasten, [facility]               Lorrene CHRISTELLA Hasten, [facility]  Lorrene CHRISTELLA Hasten, [facility]               Lorrene CHRISTELLA Hasten, [facility]               Lorrene CHRISTELLA Hasten, [facility]

## 2023-11-30 ENCOUNTER — Other Ambulatory Visit (HOSPITAL_BASED_OUTPATIENT_CLINIC_OR_DEPARTMENT_OTHER): Payer: Self-pay | Admitting: Family Medicine

## 2023-11-30 DIAGNOSIS — R0602 Shortness of breath: Secondary | ICD-10-CM

## 2023-12-02 ENCOUNTER — Encounter: Payer: Self-pay | Admitting: Behavioral Health

## 2023-12-02 ENCOUNTER — Ambulatory Visit (INDEPENDENT_AMBULATORY_CARE_PROVIDER_SITE_OTHER): Admitting: Behavioral Health

## 2023-12-02 DIAGNOSIS — F4323 Adjustment disorder with mixed anxiety and depressed mood: Secondary | ICD-10-CM

## 2023-12-02 NOTE — Progress Notes (Signed)
 [facility] Counselor/Therapist Progress Note  Patient ID: Shannon [name], MRN: [id number],    Date: November 27, 2023  Time Spent: 2 PM until 2:54 PM, 54 minutes.   This session was held via video teletherapy. The patient consented to the video teletherapy and was located in her home during this session. She is aware it is the responsibility of the patient to secure confidentiality on her end of the session. The provider was in a private home office for the duration of this session.     Reported Symptoms: Anxiety, depression  Mental Status Exam: Appearance:  Well Groomed     Behavior: Appropriate  Motor: Normal  Speech/Language:  Clear and Coherent  Affect: Appropriate for  Mood: normal  Thought process: normal  Thought content:   WNL  Sensory/Perceptual disturbances:   WNL  Orientation: oriented to person, place, time/date, situation, day of week, month of year, and year  Attention: Good  Concentration: Good  Memory: WNL  Fund of knowledge:  Good  Insight:   Good  Judgment:  Good  Impulse Control: Good   Risk Assessment: Danger to Self:  No Self-injurious Behavior: No Danger to Others: No Duty to Warn:no Physical Aggression / Violence:No  Access to Firearms a concern: No  Gang Involvement:No   Subjective:  The patient is feeling somewhat better.  The virus that she has been having has gotten better and still only has some residual congestion.  She continues to do her exercises at home and says she can see a difference there.  It does not hurt as much or as intensely and she is sleeping better.  She is disappointed that Thanksgiving will be different this year.  It would be the first Thanksgiving without her husband and even though they said it was a strained relationship that will still feel different to her.  Her daughter and son and their families do not necessarily want a traditional Thanksgiving meal and the patient does not like making 1 so she is  not sure what they will do but they will spend some time together especially doing things around the house tomorrow possibly going out later in the weekend.  For the most part she is maintaining a good attitude in terms of working hard to feel better.  She is optimistic that next year will be better in terms of her health and with less difficult situations. She does contract for safety having no thoughts of hurting herself or anyone else. Interventions: Cognitive Behavioral Therapy  Diagnosis: Adjustment disorder with mixed anxiety and depressed mood.  Plan: I will meet with the patient every 2 weeks via video session TX. Plan:To use cognitive behavioral therapy principles as well as elements of dialectical behavior therapy.  Goals are to reduce anxiety and depression by at least 50% with a target date of November 06, 2022.  Goals are to have less sadness as indicated by PH-9 scores as well as patient report.  We also work on improving mood and return to a healthier level of functioning as defined by her goals for being happy, identify causes and process triggers for depressed mood.  We will use cognitive behavioral therapy to explore and replace unhealthy thoughts and behavior patterns contributing to depression.  I will continue to encourage shearing of feelings related to the causes and symptoms of depression as well as teach and encouraged use of coping skills for management of depressive symptoms.  We also will work to improve the patient's ability to manage  anxiety symptoms and better handle stress, identify causes for anxiety and explore ways for reduction of anxiety in addition to resolving conflicts contributing to anxiety and managing thoughts and worrisome thinking is contributing to anxiety.  Interventions will include providing education about anxiety, facilitate problem solution skills, teaching coping skills for managing anxiety such as grounding exercises, progressive muscle relaxation and  cognitive re framing etc.  We will also use cognitive behavioral therapy to identify and change anxiety provoking thoughts and behavior patterns as well as using DBT distress tolerance and mindfulness skills. I reviewed the treatment plan goals with the patient who agreed to continue with goals as stated above. Progress: 40%New target date will be May 05, 2024 Lorrene CHRISTELLA Hasten, [facility]                                                Lorrene CHRISTELLA Hasten, [facility]               Lorrene CHRISTELLA Hasten, [facility]               Lorrene CHRISTELLA Hasten, [facility]               Lorrene CHRISTELLA Hasten, [facility]               Lorrene CHRISTELLA Hasten, [facility]               Lorrene CHRISTELLA Hasten, [facility]               Lorrene CHRISTELLA Hasten, [facility]               Lorrene CHRISTELLA Hasten, [facility]               Lorrene CHRISTELLA Hasten, [facility]               Lorrene CHRISTELLA Hasten, [facility]               Lorrene CHRISTELLA Hasten, [facility]               Lorrene CHRISTELLA Hasten, [facility]               Lorrene CHRISTELLA Hasten, [facility]               Lorrene CHRISTELLA Hasten, [facility]               Lorrene CHRISTELLA Hasten, [facility]               Lorrene CHRISTELLA Hasten, [facility]               Lorrene CHRISTELLA Hasten, [facility]               Lorrene CHRISTELLA Hasten, [facility]               Lorrene CHRISTELLA Hasten, [facility]               Lorrene CHRISTELLA Hasten, [facility]               Lorrene CHRISTELLA Hasten, [facility]               Lorrene CHRISTELLA Hasten, [facility]               Lorrene CHRISTELLA Hasten, [facility]               Lorrene CHRISTELLA Hasten, [facility]  Lorrene CHRISTELLA Hasten, [facility]               Lorrene CHRISTELLA Hasten, [facility]

## 2023-12-07 ENCOUNTER — Other Ambulatory Visit: Payer: Self-pay

## 2023-12-07 ENCOUNTER — Telehealth (HOSPITAL_BASED_OUTPATIENT_CLINIC_OR_DEPARTMENT_OTHER): Payer: Self-pay

## 2023-12-07 DIAGNOSIS — R0602 Shortness of breath: Secondary | ICD-10-CM

## 2023-12-07 NOTE — Telephone Encounter (Signed)
 Copied from CRM #[id number]. Topic: Clinical - Request for Lab/Test Order >> Dec 07, 2023 12:55 PM Celestine F wrote: Reason for CRM: Pt is calling to schedule overdue 1 year follow up appt with Dr. Jude or NP Madelin Stank, bu the recall suggested a PFT as well. I do not see an order for a PFT. Please call the pt back to schedule pft and follow up appt.   [phone] ok to leave a vm.

## 2023-12-07 NOTE — Patient Instructions (Signed)
 Visit Information  Thank you for taking time to visit with me today. Please don't hesitate to contact me if I can be of assistance to you before our next scheduled appointment.  Your next care management appointment is by telephone on 01/04/24 at 11 AM  Please call the care guide team at [phone] if you need to cancel, schedule, or reschedule an appointment.   Please call the Suicide and Crisis Lifeline: 988 call 1-800-273-TALK (toll free, 24 hour hotline) if you are experiencing a Mental Health or Behavioral Health Crisis or need someone to talk to.  Rosaline Finlay, RN MSN [place]  [facility] RN Care Manager Direct Dial: [phone]  Fax: [phone]   Following is a copy of your care plan:   Goals Addressed             This Visit's Progress    manage chronic pain [facility] RN Care Plan   On track    Problems:  Chronic Disease Management support and education needs related to chronic pain   Goal: Over the next 30 days the Patient will experience decrease pain  as evidenced by patient report of pain <5/10, no longer waking her up from sleeping   Interventions:   Pain Interventions: Pain assessment performed Discussed importance of adherence to all scheduled medical appointments Counseled on the importance of reporting any/all new or changed pain symptoms or management strategies to pain management provider Discussed use of relaxation techniques and/or diversional activities to assist with pain reduction (distraction, imagery, relaxation, massage, acupressure, TENS, heat, and cold application Reviewed with patient prescribed pharmacological and nonpharmacological pain relief strategies Screening for signs and symptoms of depression related to chronic disease state  Encouraged patient to continue to follow with PT for pain relief exercises and stretches. Advised to continue to perform exercises at home. Discussed PCP recommendation for orthopedic or pain  management evaluation if pain continues  Patient Self-Care Activities:  Attend all scheduled provider appointments Call pharmacy for medication refills 3-7 days in advance of running out of medications Call provider office for new concerns or questions  Perform all self care activities independently  Continue to work with PT!  Plan:  Telephone follow up appointment with care management team member scheduled for:  01/04/24 at 11 AM          [facility]BCI RN Care Plan   On track    Problems:  Chronic Disease Management support and education needs related to CHF and HTN  Goal: Over the next 30 days the Patient will demonstrate a decrease CHF in exacerbations as evidenced by patient report of stable weight and symptoms related to fluid retention demonstrate Ongoing adherence to prescribed treatment plan for HTN as evidenced by patient report of home BP readings within goal of <130/80  Interventions:   Heart Failure Interventions: Provided education on low sodium diet Discussed importance of daily weight and advised patient to weigh and record daily Reviewed role of diuretics in prevention of fluid overload and management of heart failure; Discussed the importance of keeping all appointments with provider Provided patient with education about the role of exercise in the management of heart failure Reviewed symptoms related to fluid retention such as shortness of breath Reminded patient to schedule follow-up with cardiology  Hypertension Interventions: Last practice recorded BP readings:  BP Readings from Last 3 Encounters:  11/04/23 122/70  10/20/23 126/63  10/15/23 (!) 148/83   Most recent eGFR/CrCl:  Lab Results  Component Value Date   EGFR 72  08/19/2023    No components found for: CRCL  Evaluation of current treatment plan related to hypertension self management and patient's adherence to plan as established by provider Counseled on the importance of exercise goals with target  of 150 minutes per week Advised patient, providing education and rationale, to monitor blood pressure daily and record, calling PCP for findings outside established parameters Provided education on prescribed diet low sodium Educated patient on goal BP <130/80  Patient Self-Care Activities:  Attend all scheduled provider appointments Call provider office for new concerns or questions  Perform all self care activities independently  Perform IADL's (shopping, preparing meals, housekeeping, managing finances) independently Take medications as prescribed   call office if I gain more than 2 pounds in one day or 5 pounds in one week use salt in moderation watch for swelling in feet, ankles and legs every day weigh myself daily follow rescue plan if symptoms flare-up check blood pressure daily keep a blood pressure log take blood pressure log to all doctor appointments call doctor for signs and symptoms of high blood pressure  Plan:  Telephone follow up appointment with care management team member scheduled for:  01/04/24 at 11 AM
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
## 2023-12-08 ENCOUNTER — Ambulatory Visit: Attending: Internal Medicine | Admitting: Physical Therapy

## 2023-12-08 DIAGNOSIS — M6281 Muscle weakness (generalized): Secondary | ICD-10-CM | POA: Diagnosis present

## 2023-12-08 DIAGNOSIS — R2689 Other abnormalities of gait and mobility: Secondary | ICD-10-CM | POA: Insufficient documentation

## 2023-12-08 DIAGNOSIS — R2681 Unsteadiness on feet: Secondary | ICD-10-CM | POA: Diagnosis present

## 2023-12-08 DIAGNOSIS — R0602 Shortness of breath: Secondary | ICD-10-CM | POA: Insufficient documentation

## 2023-12-08 NOTE — Therapy (Signed)
 OUTPATIENT PHYSICAL THERAPY NEURO    Patient Name: Shannon [name] MRN: [id number] DOB:[birth year], 73 y.o., female Today's Date: 12/08/2023   PCP: Thersia Stark REFERRING PROVIDER: Toribio Furnace  END OF SESSION:  PT End of Session - 12/08/23 0916     Visit Number 12    Date for Recertification  01/07/24    Authorization Type [employer]    PT Start Time 0920    PT Stop Time 1000    PT Time Calculation (min) 40 min            Past Medical History:  Diagnosis Date   Acute systolic heart failure (HCC) 07/08/2023   Allergy    Anemia    Anxiety    Arthritis 1990's   neck and lower back (03/25/2016)   Asthma 1990s X 1   short term inhaler use    CAD (coronary artery disease)    Cardiac pacemaker in situ    MDT   CHF (congestive heart failure) (HCC)    Chronic lower back pain    COVID-19 03/06/2022   Degenerative disorder of bone    Depression    Diastolic dysfunction    Drug-induced lupus erythematosus    HCTZ induced; still gettin over it (03/25/2016)   Foot swelling 05/19/2022   GERD (gastroesophageal reflux disease) 1990's   Headache, unspecified headache type 03/06/2022   Herniated disc, cervical    Hospital discharge follow-up 03/14/2022   Hyperlipidemia    Hypertension    Neuromuscular disorder (HCC)    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 HTN   Obesity (BMI 30-39.9) 11/07/2022   Orthostatic hypotension    OSA on CPAP    Osteopenia    Osteoporosis 2012   PAF (paroxysmal atrial fibrillation) (HCC)    Paroxysmal atrial fibrillation (HCC) 12/13/2021   Phlebitis after infusion 03/18/2023   Pinched nerve in neck    PND (post-nasal drip) 12/18/2022   Rapid atrial fibrillation (HCC) 07/06/2023   Sinus node dysfunction (HCC)    Sinus pressure 12/18/2022   Sleep apnea 1990's   wears CPAP   Stroke (HCC)    T12 compression fracture (HCC) 11/2015   Vitamin D  deficiency    Whiplash injury 06/07/2010   Past Surgical History:  Procedure  Laterality Date   APPENDECTOMY  1990s   ATRIAL FIBRILLATION ABLATION N/A 03/25/2016   Procedure: Atrial Fibrillation Ablation;  Surgeon: Lynwood Rakers, MD;  Location: [facility] INVASIVE CV LAB;  Service: Cardiovascular;  Laterality: N/A;   ATRIAL FIBRILLATION ABLATION N/A 01/31/2020   Procedure: ATRIAL FIBRILLATION ABLATION;  Surgeon: Rakers Lynwood, MD;  Location: [facility] INVASIVE CV LAB;  Service: Cardiovascular;  Laterality: N/A;   ATRIAL FIBRILLATION ABLATION N/A 12/13/2021   Procedure: ATRIAL FIBRILLATION ABLATION;  Surgeon: [name], MD;  Location: [facility] INVASIVE CV LAB;  Service: Cardiovascular;  Laterality: N/A;   AV NODE ABLATION N/A 09/15/2023   Procedure: AV NODE ABLATION;  Surgeon: [name], MD;  Location: [facility] INVASIVE CV LAB;  Service: Cardiovascular;  Laterality: N/A;   CARDIOVERSION N/A 07/09/2023   Procedure: CARDIOVERSION;  Surgeon: Okey Vina GAILS, MD;  Location: [facility] INVASIVE CV LAB;  Service: Cardiovascular;  Laterality: N/A;   COLONOSCOPY     FOREARM FRACTURE SURGERY Left ~ 02/2011   broke arm; shattered wrist   FOREARM HARDWARE REMOVAL Left ~ 07/2011   implantable loop recorder placement  03/07/2019   Medtronic Reveal Linq model LNQ 22 implantable loop recorder ([id number]) implanted by Dr Rakers for Afib management  INSERT / REPLACE / REMOVE PACEMAKER  2025   LOOP RECORDER REMOVAL N/A 03/09/2023   Procedure: LOOP RECORDER REMOVAL;  Surgeon: Kennyth Chew, MD;  Location: [facility] INVASIVE CV LAB;  Service: Cardiovascular;  Laterality: N/A;   PACEMAKER IMPLANT N/A 03/09/2023   Procedure: PACEMAKER IMPLANT;  Surgeon: Kennyth Chew, MD;  Location: [facility] INVASIVE CV LAB;  Service: Cardiovascular;  Laterality: N/A;   Spinal Nerve Ablation     TEE WITHOUT CARDIOVERSION N/A 03/24/2016   Procedure: TRANSESOPHAGEAL ECHOCARDIOGRAM (TEE);  Surgeon: Jerel Balding, MD;  Location: [facility] ENDOSCOPY;  Service: Cardiovascular;  Laterality: N/A;   Patient Active Problem List   Diagnosis Date Noted    Hypertensive urgency 08/28/2023   Hypokalemia 08/28/2023   Acute on chronic systolic CHF (congestive heart failure) (HCC) 08/27/2023   Chronic obstructive pulmonary disease with acute exacerbation (HCC) 07/14/2023   Chronic HFrEF (heart failure with reduced ejection fraction) (HCC) 07/06/2023   Atrial fibrillation with RVR (HCC) 03/07/2023   History of tachycardia-bradycardia syndrome (HCC) 03/07/2023   Demand ischemia (HCC) 03/07/2023   Current severe episode of major depressive disorder without psychotic features without prior episode (HCC) 03/06/2022   GAD (generalized anxiety disorder) 03/06/2022   Caregiver role strain 03/06/2022   Adjustment disorder with mixed anxiety and depressed mood 03/03/2022   Diplopia 01/15/2022   History of CVA (cerebrovascular accident) 01/15/2022   Thoracic spine pain 11/22/2021   Tinnitus of both ears 11/22/2021   Bilateral lower extremity edema 11/22/2021   Acute CVA (cerebrovascular accident) (HCC) 02/06/2021   Dyslipidemia, goal LDL below 130 07/10/2020   Iron  deficiency anemia 07/10/2020   Shortness of breath 12/23/2019   Asthma 12/23/2019   Morbid obesity (HCC) 09/11/2017   Chronic fatigue 05/27/2016   A-fib (HCC) 03/25/2016   Typical atrial flutter (HCC)    Chronic diastolic CHF (congestive heart failure) (HCC) 12/08/2015   Atrial fibrillation with rapid ventricular response (HCC) 12/08/2015   Thoracic compression fracture (HCC) 11/16/2015   Orthostatic hypotension 11/15/2015   Osteopenia 09/12/2015   Vitamin D  deficiency disease 08/29/2015   Polymyalgia rheumatica 04/18/2015   OSA (obstructive sleep apnea) 03/04/2015   Myalgia 11/22/2014   Primary hypertension 08/21/2014   Anxiety and depression 08/21/2014    ONSET DATE: 08/31/23  REFERRING DIAG:  Mobility status PT Visit Diagnosis: Unsteadiness on feet (R26.81);Muscle weakness (generalized) (M62.81)      THERAPY DIAG:  Muscle weakness (generalized)  Other abnormalities of  gait and mobility  Gait instability  Shortness of breath  Rationale for Evaluation and Treatment: Rehabilitation  SUBJECTIVE:  SUBJECTIVE STATEMENT: struggling this morning. I think its the cold and I didn't sleep much last night. Groin 70% better PERTINENT HISTORY: long history of atrial fibrillation with multiple prior catheter ablations, history of heart failure, dyspnea History of CVA  PAIN:  Are you having pain yes down RT leg PRECAUTIONS: None  RED FLAGS: None   WEIGHT BEARING RESTRICTIONS: No  FALLS: Has patient fallen in last 6 months? Yes. Number of falls 1, was moving houses and tripped over a step   LIVING ENVIRONMENT: Lives with: lives with their daughter Lives in: House/apartment Stairs: Yes: Internal: full set but does not go up them steps; on right going up and External: 3 steps; can reach both Has following equipment at home: Vannie - 2 wheeled, Environmental Consultant - 4 wheeled, and Wheelchair (manual)  PLOF: Independent, Independent with basic ADLs, Independent with community mobility without device, and Independent with gait  PATIENT GOALS: whatever you can do to make me better physically   OBJECTIVE:  Note: Objective measures were completed at Evaluation unless otherwise noted.  DIAGNOSTIC FINDINGS: N/A  COGNITION: Overall cognitive status: Within functional limits for tasks assessed   SENSATION: Light touch: some numbness in toes and spreads to feet   COORDINATION: Decrease balance and coordination   POSTURE: rounded shoulders  LOWER EXTREMITY ROM:  WFL BLE, some pain with R hip flexion   LOWER EXTREMITY MMT:    MMT Right Eval Left Eval  Hip flexion 3+ 4  Hip extension    Hip abduction 4+ 5  Hip adduction    Hip internal rotation    Hip external rotation    Knee  flexion 5 5  Knee extension 4 4+  Ankle dorsiflexion    Ankle plantarflexion    Ankle inversion    Ankle eversion    (Blank rows = not tested)   STAIRS: Findings: Level of Assistance: Modified independence and Stair Negotiation Technique: Step to Pattern with Bilateral Rails GAIT: Findings: Gait Characteristics: narrow BOS, poor foot clearance- Right, and poor foot clearance- Left, unstable especially with turns, unsteady on her feet   FUNCTIONAL TESTS:  5 times sit to stand: 16s Timed up and go (TUG): 11.30s 3 minute walk test: 614ft 97%, 80bpm BERG 45/56                                                                                                                               TREATMENT DATE:   12/08/23 PROM stretching- HS, glute, figure 4, LTR, hip flexor, SKTC, butterfly stretch Clams blue tband 2 sets 10 Supine hip flexion blue tband 2 sets 10 Feet on ball bridge, KTC and obl 20 x Isometric abdominals 15 x hold 3 sec Green tband hip IR and ER  2 sets 10 ADD ball squeeze with bridge 12 x Bridge with ABD blue tband 12 x Side stepping with green tband around thighs 10 feet 2 x each way Lateral step ups 4 inch with rails  Leg ext 10# 2x10 HS curls 25# 2x10  11/25/23 PROM stretching- HS, glute, figure 4, LTR, hip flexor, SKTC, butterfly stretch  FABER stretch  Feet on pball rotations and knees to chest 2x10 AB isometric with ball 2x10 Bridges with adduction 2x10 Seated HS and piriformis stretch BLE Resisted IR against band 2x10  11/24/23 Goals assessed and document- BERG and Nustep L 5 Resisted gait 5 x 4 ways 20 # Green tband 2 sets 10 - abd,flex ,IR/ER PROM and stretching RT LE esp HS,quad and hip flexor and ABD STW to above     11/13/23 Adjusted HEP for core stab- see below. Performed and cued PROM and stretching RT LE esp HS,quad and hip flexor and ABD STW to above     11/06/23 Nustep L 5 PROM and stretching RT LE esp HS,quad and hip  flexor and ABD STW to above with and without theragun Stretching and issued for home- supine RT leg off bed hang, clam stretch supine, standing HS and ABD Pelvic ROM on dyna disc, isometric RT hip strengthening ( flex,abd,add,IR,ER)      11/04/23 Walking laps holding 3# weights  Step ups 6 Walking on beam NuStep L5x60mins Leg ext 10# 2x10 HS curls 25# 2x10 STS holding yellow ball on airex 2x10   10/27/23 Nustep L 5 5 min HS curl 25# 2 sets 10 Knee ext 10# 2 sets 10 Seated row 20# 2 sets 10 Seated resisted trunk ext 2 sets 10 Leg press 30# 2 sets 10. Calf raises 30# 2 sets 10 STS with wt ball chest press 10 x Mech lumbar traction  60 #  10/15/23 Nustep L 5 O2  98 HR 86 STS 5 x 11.5  450 feet 3 min PRE 9/10, O2 100. HR 73 STS with wt ball chest press 10 x 6 inch step up with opp leg ext 10 xBIL with UE 6 inch lateral step up with opp leg abd 10 x BIL with UE HS Curls 25lb 2x10 Leg Ext 10lb 2x10 Seated row 20# 2 sets 10 Seated resisted trunk ext 2 sets 10  10/13/23 Bike L 3 O2  98    HR  80 STS with wt ball press 10 x 2 sets Resisted gait 30# 5 x fwd and back and 3 x each side Leg press 30# 2 sets 10 ( seat on 7, tried 6 but too difficult) 6 in step ups with light HHA 10 x BIL HS Curls 25lb 2x10 Leg Ext 10lb 2x10 6# farmer carry 1 lap each hand Stepping over objects CGA     10/07/23 Bike L 2.5 x 6 min  O2 98%  Goals  Sit to stands holding 2lb 2x10 HS Curls 25lb 2x10 Leg Ext 10lb 2x10 6in step ups x10 each  4in lateral step ups x10 Heel Raises black bar 2x10   10/01/23  Nustep L 2 5 min  O2 after 96 Red tband 2 sets 10 HS curl,LAQ,clams,hip abd and hip flexion STS with 3# chest press 2 sets 5, O2 after 95 Red tband 10 x SL hip flex,ext and abd Feet on ball bridge,KTC and obl  10 x each Iso abdominals with ball 10 x hold 3 sec Resisted gait 3 x 4 ways    09/23/23 EVAL    PATIENT EDUCATION: Education details: POC, HEP Person educated:  Patient Education method: Medical Illustrator Education comprehension: verbalized understanding and returned demonstration  HOME EXERCISE PROGRAM:  Access Code: [email or web address] URL: [email or web address] Date:  11/13/2023 Prepared by: Shanteria Laye  Exercises - Supine Posterior Pelvic Tilt  - 2 x daily - 7 x weekly - 1 sets - 15 reps - 3 hold - Supine Bridge  - 2 x daily - 7 x weekly - 1 sets - 10 reps - 3 hold - Supine March  - 2 x daily - 7 x weekly - 1 sets - 10 reps - Supine Hip Adduction Isometric with Ball  - 2 x daily - 7 x weekly - 1 sets - 10 reps - 3 hold - Hooklying Clamshell with Resistance  - 2 x daily - 7 x weekly - 1 sets - 10 reps - 3 hold - Supine Pelvic Tilt with Straight Leg Raise  - 2 x daily - 7 x weekly - 1 sets - 15 reps - 3 hold     Access Code: [email or web address] URL: [email or web address] Date: 10/01/2023 Prepared by: Geniya Fulgham  Exercises - Standing Hip Flexion with Resistance Loop  - 1 x daily - 7 x weekly - 1 sets - 10 reps - Hip Extension with Resistance Loop  - 1 x daily - 7 x weekly - 1 sets - 10 reps - Hip Abduction with Resistance Loop  - 1 x daily - 7 x weekly - 1 sets - 10 reps - Seated Long Arc Quad  - 1 x daily - 7 x weekly - 1 sets - 10 reps - Seated March  - 1 x daily - 7 x weekly - 1 sets - 10 reps - Seated Hip Abduction with Resistance  - 1 x daily - 7 x weekly - 10 reps  Access Code: [email or web address] URL: [email or web address] Date: 09/23/2023 Prepared by: Almetta Fam  Exercises - Sit to Stand  - 1 x daily - 7 x weekly - 2 sets - 10 reps - Standing Single Leg Stance with Counter Support  - 1 x daily - 7 x weekly - 10 reps - 10 hold - Standing Tandem Balance with Counter Support  - 1 x daily - 7 x weekly - 10 reps - 15 hold  GOALS: Goals reviewed with patient? Yes  SHORT TERM GOALS: Target date: 10/28/23  Patient will be independent with initial HEP. Baseline:  Goal status: 10/01/23 MET  2.   Patient will demonstrate improved functional LE strength as demonstrated by 5xSTS <13s. Baseline: 16s Goal status: 14.60 sec Progressing 10/07/23   MET 10/15/23   LONG TERM GOALS: Target date: 01/07/24  Patient will be independent with advanced/ongoing HEP to improve outcomes and carryover.  Baseline:  Goal status: progressing 10/27/23  and 11/13/23   11/24/23 progressing  and 12/08/23  2.  Patient will be able to ambulate 800' in with less staggering and unsteadiness Baseline: 687ft some SOB and needs SBA Goal status: 10/15/23 450 feet 3 min PRE 9/10. Limited today d/t SOB/cough from virus last week 10/27/23.  11/24/23 4 1/2 mins SOB and PRE 8/10, no unsteadiness but fatigue MET  3.  Patient will score 53 on Berg Balance test to demonstrate lower risk of falls. (MCID= 8 points) .  Baseline: 45 Goal status: 11/24/23 50/56  and 12/08/23  4.  Patient will able to return to her normal household chores/routine without SOB and fatigue.  Baseline:  Goal status: progressing 10/15/23  and 10/27/23, ongoing 11/26/23 and 12/08/23  5.  Patient will reports decrease in pain in R hip and groin <2/10. Baseline: 7/10 at worst Goal status: ongoing 10/07/23  10/27/23 progressing  and  11/06/23  11/24/23 baseline better but pain spikes with actvities 12/08/23 70% better  ASSESSMENT:  CLINICAL IMPRESSION:  Patient reports stretching has been the most beneficial thing so far, so we focused again on this today. Pt reports groin pain 70% better. Goals assessed. Progressed core and hip strengthening. Patient will continue to benefit form skilled therapy to address limitations.  Patient is a 72 y.o. female who was seen today for physical therapy evaluation and treatment for generalized weakness. She has an extensive medical history of heart issues, including a-fib and heart failure. She reports being sick in June and everything went downhill since then. Recent AV node ablation was on 09/15/23. Pt reports  difficulty with her normal activities due to fatigue, shortness or breath, and weakness. She reports some staggering and wobbling when walking but she also thinks she has some vision problems that contribute to this. She does have a walker that she uses for longer distances. With functional tests completed today, she does present as a fall risk. Therefore, she will benefit from skilled PT to address weakness, endurance, gait, and balance impairments to decrease risk for fall and help her return to PLOF.  OBJECTIVE IMPAIRMENTS: Abnormal gait, cardiopulmonary status limiting activity, decreased activity tolerance, decreased balance, decreased coordination, decreased endurance, decreased strength, increased edema, and pain.   ACTIVITY LIMITATIONS: carrying, squatting, stairs, and locomotion level  PARTICIPATION LIMITATIONS: meal prep, cleaning, laundry, shopping, community activity, and yard work  PERSONAL FACTORS: Age, Fitness, and 1-2 comorbidities: a-fib, heart failure are also affecting patient's functional outcome.   REHAB POTENTIAL: Good  CLINICAL DECISION MAKING: Stable/uncomplicated  EVALUATION COMPLEXITY: Low  PLAN:  PT FREQUENCY: 2x/week  PT DURATION: 10 weeks  PLANNED INTERVENTIONS: 97110-Therapeutic exercises, 97530- Therapeutic activity, 97112- Neuromuscular re-education, 97535- Self Care, [id number]- Manual therapy, [phone]- Gait training, Patient/Family education, Balance training, Stair training, Cryotherapy, and Moist heat  PLAN FOR NEXT SESSION:assess and progress   Henery Betzold,ANGIE, PTA 12/08/2023, 9:16 AM

## 2023-12-08 NOTE — Telephone Encounter (Signed)
 Left voicemail for patient. She primarily sees [facility] Manager at [facility]. Instructed patient to call back to schedule PFT and visit with TP at [facility].

## 2023-12-10 ENCOUNTER — Ambulatory Visit: Admitting: Physical Therapy

## 2023-12-10 DIAGNOSIS — M6281 Muscle weakness (generalized): Secondary | ICD-10-CM

## 2023-12-10 DIAGNOSIS — R2681 Unsteadiness on feet: Secondary | ICD-10-CM

## 2023-12-10 NOTE — Therapy (Signed)
 OUTPATIENT PHYSICAL THERAPY NEURO    Patient Name: Shannon [name] MRN: [id number] DOB:27-Jan-1950, 73 y.o., female Today's Date: 12/10/2023   PCP: Thersia Stark REFERRING PROVIDER: Toribio Furnace  END OF SESSION:  PT End of Session - 12/10/23 1140     Visit Number 13    Date for Recertification  01/07/24    Authorization Type U[employer]    PT Start Time 1140    PT Stop Time 1225    PT Time Calculation (min) 45 min            Past Medical History:  Diagnosis Date   Acute systolic heart failure (HCC) 07/08/2023   Allergy    Anemia    Anxiety    Arthritis 1990's   neck and lower back (03/25/2016)   Asthma 1990s X 1   short term inhaler use    CAD (coronary artery disease)    Cardiac pacemaker in situ    MDT   CHF (congestive heart failure) (HCC)    Chronic lower back pain    COVID-19 03/06/2022   Degenerative disorder of bone    Depression    Diastolic dysfunction    Drug-induced lupus erythematosus    HCTZ induced; still gettin over it (03/25/2016)   Foot swelling 05/19/2022   GERD (gastroesophageal reflux disease) 1990's   Headache, unspecified headache type 03/06/2022   Herniated disc, cervical    Hospital discharge follow-up 03/14/2022   Hyperlipidemia    Hypertension    Neuromuscular disorder (HCC)    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 HTN   Obesity (BMI 30-39.9) 11/07/2022   Orthostatic hypotension    OSA on CPAP    Osteopenia    Osteoporosis 2012   PAF (paroxysmal atrial fibrillation) (HCC)    Paroxysmal atrial fibrillation (HCC) 12/13/2021   Phlebitis after infusion 03/18/2023   Pinched nerve in neck    PND (post-nasal drip) 12/18/2022   Rapid atrial fibrillation (HCC) 07/06/2023   Sinus node dysfunction (HCC)    Sinus pressure 12/18/2022   Sleep apnea 1990's   wears CPAP   Stroke (HCC)    T12 compression fracture (HCC) 11/2015   Vitamin D  deficiency    Whiplash injury 06/07/2010   Past Surgical History:  Procedure  Laterality Date   APPENDECTOMY  1990s   ATRIAL FIBRILLATION ABLATION N/A 03/25/2016   Procedure: Atrial Fibrillation Ablation;  Surgeon: Lynwood Rakers, MD;  Location: [facility] INVASIVE CV LAB;  Service: Cardiovascular;  Laterality: N/A;   ATRIAL FIBRILLATION ABLATION N/A 01/31/2020   Procedure: ATRIAL FIBRILLATION ABLATION;  Surgeon: Rakers Lynwood, MD;  Location: [facility] INVASIVE CV LAB;  Service: Cardiovascular;  Laterality: N/A;   ATRIAL FIBRILLATION ABLATION N/A 12/13/2021   Procedure: ATRIAL FIBRILLATION ABLATION;  Surgeon: [name], MD;  Location: [facility] INVASIVE CV LAB;  Service: Cardiovascular;  Laterality: N/A;   AV NODE ABLATION N/A 09/15/2023   Procedure: AV NODE ABLATION;  Surgeon: [name], MD;  Location: [facility] INVASIVE CV LAB;  Service: Cardiovascular;  Laterality: N/A;   CARDIOVERSION N/A 07/09/2023   Procedure: CARDIOVERSION;  Surgeon: Okey Vina GAILS, MD;  Location: [facility] INVASIVE CV LAB;  Service: Cardiovascular;  Laterality: N/A;   COLONOSCOPY     FOREARM FRACTURE SURGERY Left ~ 02/2011   broke arm; shattered wrist   FOREARM HARDWARE REMOVAL Left ~ 07/2011   implantable loop recorder placement  03/07/2019   Medtronic Reveal Linq model LNQ 22 implantable loop recorder ([id number]) implanted by Dr Rakers for Afib management  INSERT / REPLACE / REMOVE PACEMAKER  2025   LOOP RECORDER REMOVAL N/A 03/09/2023   Procedure: LOOP RECORDER REMOVAL;  Surgeon: Kennyth Chew, MD;  Location: [facility] INVASIVE CV LAB;  Service: Cardiovascular;  Laterality: N/A;   PACEMAKER IMPLANT N/A 03/09/2023   Procedure: PACEMAKER IMPLANT;  Surgeon: Kennyth Chew, MD;  Location: [facility] INVASIVE CV LAB;  Service: Cardiovascular;  Laterality: N/A;   Spinal Nerve Ablation     TEE WITHOUT CARDIOVERSION N/A 03/24/2016   Procedure: TRANSESOPHAGEAL ECHOCARDIOGRAM (TEE);  Surgeon: Jerel Balding, MD;  Location: [facility] ENDOSCOPY;  Service: Cardiovascular;  Laterality: N/A;   Patient Active Problem List   Diagnosis Date Noted    Hypertensive urgency 08/28/2023   Hypokalemia 08/28/2023   Acute on chronic systolic CHF (congestive heart failure) (HCC) 08/27/2023   Chronic obstructive pulmonary disease with acute exacerbation (HCC) 07/14/2023   Chronic HFrEF (heart failure with reduced ejection fraction) (HCC) 07/06/2023   Atrial fibrillation with RVR (HCC) 03/07/2023   History of tachycardia-bradycardia syndrome (HCC) 03/07/2023   Demand ischemia (HCC) 03/07/2023   Current severe episode of major depressive disorder without psychotic features without prior episode (HCC) 03/06/2022   GAD (generalized anxiety disorder) 03/06/2022   Caregiver role strain 03/06/2022   Adjustment disorder with mixed anxiety and depressed mood 03/03/2022   Diplopia 01/15/2022   History of CVA (cerebrovascular accident) 01/15/2022   Thoracic spine pain 11/22/2021   Tinnitus of both ears 11/22/2021   Bilateral lower extremity edema 11/22/2021   Acute CVA (cerebrovascular accident) (HCC) 02/06/2021   Dyslipidemia, goal LDL below 130 07/10/2020   Iron  deficiency anemia 07/10/2020   Shortness of breath 12/23/2019   Asthma 12/23/2019   Morbid obesity (HCC) 09/11/2017   Chronic fatigue 05/27/2016   A-fib (HCC) 03/25/2016   Typical atrial flutter (HCC)    Chronic diastolic CHF (congestive heart failure) (HCC) 12/08/2015   Atrial fibrillation with rapid ventricular response (HCC) 12/08/2015   Thoracic compression fracture (HCC) 11/16/2015   Orthostatic hypotension 11/15/2015   Osteopenia 09/12/2015   Vitamin D  deficiency disease 08/29/2015   Polymyalgia rheumatica 04/18/2015   OSA (obstructive sleep apnea) 03/04/2015   Myalgia 11/22/2014   Primary hypertension 08/21/2014   Anxiety and depression 08/21/2014    ONSET DATE: 08/31/23  REFERRING DIAG:  Mobility status PT Visit Diagnosis: Unsteadiness on feet (R26.81);Muscle weakness (generalized) (M62.81)      THERAPY DIAG:  Muscle weakness (generalized)  Gait  instability  Rationale for Evaluation and Treatment: Rehabilitation  SUBJECTIVE:  SUBJECTIVE STATEMENT: sore but a good sore- legs and hips. Did not stretch yesterday and that makes a difference;. Pt states she is getting stronger as she could walk to back of store without needing a cart PERTINENT HISTORY: long history of atrial fibrillation with multiple prior catheter ablations, history of heart failure, dyspnea History of CVA  PAIN:  Are you having pain yes down RT leg PRECAUTIONS: None  RED FLAGS: None   WEIGHT BEARING RESTRICTIONS: No  FALLS: Has patient fallen in last 6 months? Yes. Number of falls 1, was moving houses and tripped over a step   LIVING ENVIRONMENT: Lives with: lives with their daughter Lives in: House/apartment Stairs: Yes: Internal: full set but does not go up them steps; on right going up and External: 3 steps; can reach both Has following equipment at home: Vannie - 2 wheeled, Environmental Consultant - 4 wheeled, and Wheelchair (manual)  PLOF: Independent, Independent with basic ADLs, Independent with community mobility without device, and Independent with gait  PATIENT GOALS: whatever you can do to make me better physically   OBJECTIVE:  Note: Objective measures were completed at Evaluation unless otherwise noted.  DIAGNOSTIC FINDINGS: N/A  COGNITION: Overall cognitive status: Within functional limits for tasks assessed   SENSATION: Light touch: some numbness in toes and spreads to feet   COORDINATION: Decrease balance and coordination   POSTURE: rounded shoulders  LOWER EXTREMITY ROM:  WFL BLE, some pain with R hip flexion   LOWER EXTREMITY MMT:    MMT Right Eval Left Eval  Hip flexion 3+ 4  Hip extension    Hip abduction 4+ 5  Hip adduction    Hip internal  rotation    Hip external rotation    Knee flexion 5 5  Knee extension 4 4+  Ankle dorsiflexion    Ankle plantarflexion    Ankle inversion    Ankle eversion    (Blank rows = not tested)   STAIRS: Findings: Level of Assistance: Modified independence and Stair Negotiation Technique: Step to Pattern with Bilateral Rails GAIT: Findings: Gait Characteristics: narrow BOS, poor foot clearance- Right, and poor foot clearance- Left, unstable especially with turns, unsteady on her feet   FUNCTIONAL TESTS:  5 times sit to stand: 16s Timed up and go (TUG): 11.30s 3 minute walk test: 669ft 97%, 80bpm BERG 45/56                                                                                                                               TREATMENT DATE:   12/10/23 PROM stretching- HS, glute, figure 4, LTR, hip flexor, SKTC, butterfly stretch. STM to RT ADD. ADD ball squeeze 10 x then 10 x with bridge Clams blue tband 2 sets 10 then 10 x with bridge Feet on ball bridge, KTC and obl 20 x Isometric abdominals 15 x hold 3 sec STS with wt ball 10 x chest press 10 x OH 4 inch lateral step up  with opp leg abd 12 x BIL,UE support Leg press 30# 3 sets 10 feet 3 ways for hips Leg ext 10# 2x10 HS curls 25# 2x10 Resisted gait laterally 5 x each way    12/08/23 PROM stretching- HS, glute, figure 4, LTR, hip flexor, SKTC, butterfly stretch Clams blue tband 2 sets 10 Supine hip flexion blue tband 2 sets 10 Feet on ball bridge, KTC and obl 20 x Isometric abdominals 15 x hold 3 sec Green tband hip IR and ER  2 sets 10 ADD ball squeeze with bridge 12 x Bridge with ABD blue tband 12 x Side stepping with green tband around thighs 10 feet 2 x each way Lateral step ups 4 inch with rails Leg ext 10# 2x10 HS curls 25# 2x10  11/25/23 PROM stretching- HS, glute, figure 4, LTR, hip flexor, SKTC, butterfly stretch  FABER stretch  Feet on pball rotations and knees to chest 2x10 AB isometric with ball  2x10 Bridges with adduction 2x10 Seated HS and piriformis stretch BLE Resisted IR against band 2x10  11/24/23 Goals assessed and document- BERG and Nustep L 5 Resisted gait 5 x 4 ways 20 # Green tband 2 sets 10 - abd,flex ,IR/ER PROM and stretching RT LE esp HS,quad and hip flexor and ABD STW to above     11/13/23 Adjusted HEP for core stab- see below. Performed and cued PROM and stretching RT LE esp HS,quad and hip flexor and ABD STW to above     11/06/23 Nustep L 5 PROM and stretching RT LE esp HS,quad and hip flexor and ABD STW to above with and without theragun Stretching and issued for home- supine RT leg off bed hang, clam stretch supine, standing HS and ABD Pelvic ROM on dyna disc, isometric RT hip strengthening ( flex,abd,add,IR,ER)      11/04/23 Walking laps holding 3# weights  Step ups 6 Walking on beam NuStep L5x65mins Leg ext 10# 2x10 HS curls 25# 2x10 STS holding yellow ball on airex 2x10   10/27/23 Nustep L 5 5 min HS curl 25# 2 sets 10 Knee ext 10# 2 sets 10 Seated row 20# 2 sets 10 Seated resisted trunk ext 2 sets 10 Leg press 30# 2 sets 10. Calf raises 30# 2 sets 10 STS with wt ball chest press 10 x Mech lumbar traction  60 #  10/15/23 Nustep L 5 O2  98 HR 86 STS 5 x 11.5  450 feet 3 min PRE 9/10, O2 100. HR 73 STS with wt ball chest press 10 x 6 inch step up with opp leg ext 10 xBIL with UE 6 inch lateral step up with opp leg abd 10 x BIL with UE HS Curls 25lb 2x10 Leg Ext 10lb 2x10 Seated row 20# 2 sets 10 Seated resisted trunk ext 2 sets 10  10/13/23 Bike L 3 O2  98    HR  80 STS with wt ball press 10 x 2 sets Resisted gait 30# 5 x fwd and back and 3 x each side Leg press 30# 2 sets 10 ( seat on 7, tried 6 but too difficult) 6 in step ups with light HHA 10 x BIL HS Curls 25lb 2x10 Leg Ext 10lb 2x10 6# farmer carry 1 lap each hand Stepping over objects CGA     10/07/23 Bike L 2.5 x 6 min  O2 98%   Goals  Sit to stands holding 2lb 2x10 HS Curls 25lb 2x10 Leg Ext 10lb 2x10 6in  step ups x10 each  4in lateral step ups x10 Heel Raises black bar 2x10   10/01/23  Nustep L 2 5 min  O2 after 96 Red tband 2 sets 10 HS curl,LAQ,clams,hip abd and hip flexion STS with 3# chest press 2 sets 5, O2 after 95 Red tband 10 x SL hip flex,ext and abd Feet on ball bridge,KTC and obl  10 x each Iso abdominals with ball 10 x hold 3 sec Resisted gait 3 x 4 ways    09/23/23 EVAL    PATIENT EDUCATION: Education details: POC, HEP Person educated: Patient Education method: Medical Illustrator Education comprehension: verbalized understanding and returned demonstration  HOME EXERCISE PROGRAM:  Access Code: [email or web address] URL: [email or web address] Date: 11/13/2023 Prepared by: Argelio Granier  Exercises - Supine Posterior Pelvic Tilt  - 2 x daily - 7 x weekly - 1 sets - 15 reps - 3 hold - Supine Bridge  - 2 x daily - 7 x weekly - 1 sets - 10 reps - 3 hold - Supine March  - 2 x daily - 7 x weekly - 1 sets - 10 reps - Supine Hip Adduction Isometric with Ball  - 2 x daily - 7 x weekly - 1 sets - 10 reps - 3 hold - Hooklying Clamshell with Resistance  - 2 x daily - 7 x weekly - 1 sets - 10 reps - 3 hold - Supine Pelvic Tilt with Straight Leg Raise  - 2 x daily - 7 x weekly - 1 sets - 15 reps - 3 hold     Access Code: [email or web address] URL: [email or web address] Date: 10/01/2023 Prepared by: Mala Gibbard  Exercises - Standing Hip Flexion with Resistance Loop  - 1 x daily - 7 x weekly - 1 sets - 10 reps - Hip Extension with Resistance Loop  - 1 x daily - 7 x weekly - 1 sets - 10 reps - Hip Abduction with Resistance Loop  - 1 x daily - 7 x weekly - 1 sets - 10 reps - Seated Long Arc Quad  - 1 x daily - 7 x weekly - 1 sets - 10 reps - Seated March  - 1 x daily - 7 x weekly - 1 sets - 10 reps - Seated Hip Abduction with Resistance  - 1 x daily - 7 x weekly - 10  reps  Access Code: [email or web address] URL: [email or web address] Date: 09/23/2023 Prepared by: Almetta Fam  Exercises - Sit to Stand  - 1 x daily - 7 x weekly - 2 sets - 10 reps - Standing Single Leg Stance with Counter Support  - 1 x daily - 7 x weekly - 10 reps - 10 hold - Standing Tandem Balance with Counter Support  - 1 x daily - 7 x weekly - 10 reps - 15 hold  GOALS: Goals reviewed with patient? Yes  SHORT TERM GOALS: Target date: 10/28/23  Patient will be independent with initial HEP. Baseline:  Goal status: 10/01/23 MET  2.  Patient will demonstrate improved functional LE strength as demonstrated by 5xSTS <13s. Baseline: 16s Goal status: 14.60 sec Progressing 10/07/23   MET 10/15/23   LONG TERM GOALS: Target date: 01/07/24  Patient will be independent with advanced/ongoing HEP to improve outcomes and carryover.  Baseline:  Goal status: progressing 10/27/23  and 11/13/23   11/24/23 progressing  and 12/08/23  2.  Patient will be able to ambulate 800' in with less staggering and unsteadiness Baseline: 684ft some SOB and needs SBA  Goal status: 10/15/23 450 feet 3 min PRE 9/10. Limited today d/t SOB/cough from virus last week 10/27/23.  11/24/23 4 1/2 mins SOB and PRE 8/10, no unsteadiness but fatigue MET  3.  Patient will score 53 on Berg Balance test to demonstrate lower risk of falls. (MCID= 8 points) .  Baseline: 45 Goal status: 11/24/23 50/56  and 12/08/23  4.  Patient will able to return to her normal household chores/routine without SOB and fatigue.  Baseline:  Goal status: progressing 10/15/23  and 10/27/23, ongoing 11/26/23 and 12/08/23  5.  Patient will reports decrease in pain in R hip and groin <2/10. Baseline: 7/10 at worst Goal status: ongoing 10/07/23  10/27/23 progressing  and  11/06/23  11/24/23 baseline better but pain spikes with actvities 12/08/23 70% better  ASSESSMENT:  CLINICAL IMPRESSION:  Pnt arrives stating she is sore, did not stretch  yesterday and that makes all the difference. Pnt immediately responded to STW and stretching. Progressed strengthening of core and hips with cuing needed.  Patient is a 73 y.o. female who was seen today for physical therapy evaluation and treatment for generalized weakness. She has an extensive medical history of heart issues, including a-fib and heart failure. She reports being sick in June and everything went downhill since then. Recent AV node ablation was on 09/15/23. Pt reports difficulty with her normal activities due to fatigue, shortness or breath, and weakness. She reports some staggering and wobbling when walking but she also thinks she has some vision problems that contribute to this. She does have a walker that she uses for longer distances. With functional tests completed today, she does present as a fall risk. Therefore, she will benefit from skilled PT to address weakness, endurance, gait, and balance impairments to decrease risk for fall and help her return to PLOF.  OBJECTIVE IMPAIRMENTS: Abnormal gait, cardiopulmonary status limiting activity, decreased activity tolerance, decreased balance, decreased coordination, decreased endurance, decreased strength, increased edema, and pain.   ACTIVITY LIMITATIONS: carrying, squatting, stairs, and locomotion level  PARTICIPATION LIMITATIONS: meal prep, cleaning, laundry, shopping, community activity, and yard work  PERSONAL FACTORS: Age, Fitness, and 1-2 comorbidities: a-fib, heart failure are also affecting patient's functional outcome.   REHAB POTENTIAL: Good  CLINICAL DECISION MAKING: Stable/uncomplicated  EVALUATION COMPLEXITY: Low  PLAN:  PT FREQUENCY: 2x/week  PT DURATION: 10 weeks  PLANNED INTERVENTIONS: 97110-Therapeutic exercises, 97530- Therapeutic activity, 97112- Neuromuscular re-education, 97535- Self Care, [id number]- Manual therapy, [phone]- Gait training, Patient/Family education, Balance training, Stair training, Cryotherapy,  and Moist heat  PLAN FOR NEXT SESSION:assess and progress   Robert Sperl,ANGIE, PTA 12/10/2023, 11:41 AM

## 2023-12-11 ENCOUNTER — Ambulatory Visit: Admitting: Behavioral Health

## 2023-12-11 DIAGNOSIS — F4323 Adjustment disorder with mixed anxiety and depressed mood: Secondary | ICD-10-CM

## 2023-12-13 ENCOUNTER — Encounter: Payer: Self-pay | Admitting: Behavioral Health

## 2023-12-13 NOTE — Progress Notes (Signed)
 [facility] Counselor/Therapist Progress Note  Patient ID: Shannon [name], MRN: [id number],    Date: December 5,, 2025  Time Spent: 3:02 PM until 3:57 PM, 55 minutes.   This session was held via video teletherapy. The patient consented to the video teletherapy and was located in her home during this session. She is aware it is the responsibility of the patient to secure confidentiality on her end of the session. The provider was in a private home office for the duration of this session.     Reported Symptoms: Anxiety, depression  Mental Status Exam: Appearance:  Well Groomed     Behavior: Appropriate  Motor: Normal  Speech/Language:  Clear and Coherent  Affect: Appropriate for  Mood: normal  Thought process: normal  Thought content:   WNL  Sensory/Perceptual disturbances:   WNL  Orientation: oriented to person, place, time/date, situation, day of week, month of year, and year  Attention: Good  Concentration: Good  Memory: WNL  Fund of knowledge:  Good  Insight:   Good  Judgment:  Good  Impulse Control: Good   Risk Assessment: Danger to Self:  No Self-injurious Behavior: No Danger to Others: No Duty to Warn:no Physical Aggression / Violence:No  Access to Firearms a concern: No  Gang Involvement:No   Subjective:  The patient is feeling somewhat better.  She still is not where she wants to be medically and physically but can see that she is making slow but steady progress.  Her leg is not hurting as much as long she continues to do physical therapy and stretch it.  The virus type symptoms have gotten better but there is still some residual cough and congestion.  Acknowledges just being exhausted from something being wrong almost constantly since early 2025.  That was complicated by emotional and mental stress inflected by her husband.  The situation which she thought she would get something financially from her insurance policy but he set it up so that it  expired a couple of years ago.  He is struggling with feeling that even from the grief he is trying to do things to her although logically she knows that is not the case.  Would like to where she has in the crease states is not only from the loss of her marriage and husband but medically.  We stressed the importance of compartmentalization with those things that she cannot control and stress doing as much as she can do the house and getting out if she can and as she feels like it. She does contract for safety having no thoughts of hurting herself or anyone else. Interventions: Cognitive Behavioral Therapy  Diagnosis: Adjustment disorder with mixed anxiety and depressed mood.  Plan: I will meet with the patient every 2 weeks via video session TX. Plan:To use cognitive behavioral therapy principles as well as elements of dialectical behavior therapy.  Goals are to reduce anxiety and depression by at least 50% with a target date of November 06, 2022.  Goals are to have less sadness as indicated by PH-9 scores as well as patient report.  We also work on improving mood and return to a healthier level of functioning as defined by her goals for being happy, identify causes and process triggers for depressed mood.  We will use cognitive behavioral therapy to explore and replace unhealthy thoughts and behavior patterns contributing to depression.  I will continue to encourage shearing of feelings related to the causes and symptoms of depression as well as  teach and encouraged use of coping skills for management of depressive symptoms.  We also will work to improve the patient's ability to manage anxiety symptoms and better handle stress, identify causes for anxiety and explore ways for reduction of anxiety in addition to resolving conflicts contributing to anxiety and managing thoughts and worrisome thinking is contributing to anxiety.  Interventions will include providing education about anxiety, facilitate problem  solution skills, teaching coping skills for managing anxiety such as grounding exercises, progressive muscle relaxation and cognitive re framing etc.  We will also use cognitive behavioral therapy to identify and change anxiety provoking thoughts and behavior patterns as well as using DBT distress tolerance and mindfulness skills. I reviewed the treatment plan goals with the patient who agreed to continue with goals as stated above. Progress: 40%New target date will be May 05, 2024 Lorrene CHRISTELLA Hasten, [facility]                                                Lorrene CHRISTELLA Hasten, [facility]               Lorrene CHRISTELLA Hasten, [facility]               Lorrene CHRISTELLA Hasten, [facility]               Lorrene CHRISTELLA Hasten, [facility]               Lorrene CHRISTELLA Hasten, [facility]               Lorrene CHRISTELLA Hasten, [facility]               Lorrene CHRISTELLA Hasten, [facility]               Lorrene CHRISTELLA Hasten, [facility]               Lorrene CHRISTELLA Hasten, [facility]               Lorrene CHRISTELLA Hasten, [facility]               Lorrene CHRISTELLA Hasten, [facility]               Lorrene CHRISTELLA Hasten, [facility]               Lorrene CHRISTELLA Hasten, [facility]               Lorrene CHRISTELLA Hasten, [facility]               Lorrene CHRISTELLA Hasten, [facility]               Lorrene CHRISTELLA Hasten, [facility]               Lorrene CHRISTELLA Hasten, [facility]               Lorrene CHRISTELLA Hasten, [facility]               Lorrene CHRISTELLA Hasten, [facility]               Lorrene CHRISTELLA Hasten, [facility]               Lorrene CHRISTELLA Hasten, [facility]               Lorrene CHRISTELLA Hasten, [facility]               Lorrene CHRISTELLA Hasten, [facility]  Lorrene CHRISTELLA Hasten, [facility]               Lorrene CHRISTELLA Hasten, [facility]               Lorrene CHRISTELLA Hasten, [facility]               Lorrene CHRISTELLA Hasten, [facility]

## 2023-12-14 ENCOUNTER — Other Ambulatory Visit: Payer: Self-pay | Admitting: Nurse Practitioner

## 2023-12-15 ENCOUNTER — Ambulatory Visit

## 2023-12-15 DIAGNOSIS — R0602 Shortness of breath: Secondary | ICD-10-CM

## 2023-12-15 DIAGNOSIS — R2681 Unsteadiness on feet: Secondary | ICD-10-CM

## 2023-12-15 DIAGNOSIS — M6281 Muscle weakness (generalized): Secondary | ICD-10-CM

## 2023-12-15 DIAGNOSIS — R2689 Other abnormalities of gait and mobility: Secondary | ICD-10-CM

## 2023-12-15 NOTE — Therapy (Signed)
 OUTPATIENT PHYSICAL THERAPY NEURO    Patient Name: Shannon [name] MRN: [id number] DOB:02/20/1950, 73 y.o., female Today's Date: 12/15/2023  PCP: Thersia Stark REFERRING PROVIDER: Toribio Furnace  END OF SESSION:  PT End of Session - 12/15/23 1058     Visit Number 14    Date for Recertification  01/07/24    Authorization Type [employer] Medicare    PT Start Time 1055    PT Stop Time 1140    PT Time Calculation (min) 45 min    Activity Tolerance Patient tolerated treatment well    Behavior During Therapy [facility] for tasks assessed/performed         Past Medical History:  Diagnosis Date   Acute systolic heart failure (HCC) 07/08/2023   Allergy    Anemia    Anxiety    Arthritis 1990's   neck and lower back (03/25/2016)   Asthma 1990s X 1   short term inhaler use    CAD (coronary artery disease)    Cardiac pacemaker in situ    MDT   CHF (congestive heart failure) (HCC)    Chronic lower back pain    COVID-19 03/06/2022   Degenerative disorder of bone    Depression    Diastolic dysfunction    Drug-induced lupus erythematosus    HCTZ induced; still gettin over it (03/25/2016)   Foot swelling 05/19/2022   GERD (gastroesophageal reflux disease) 1990's   Headache, unspecified headache type 03/06/2022   Herniated disc, cervical    Hospital discharge follow-up 03/14/2022   Hyperlipidemia    Hypertension    Neuromuscular disorder (HCC)    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 HTN   Obesity (BMI 30-39.9) 11/07/2022   Orthostatic hypotension    OSA on CPAP    Osteopenia    Osteoporosis 2012   PAF (paroxysmal atrial fibrillation) (HCC)    Paroxysmal atrial fibrillation (HCC) 12/13/2021   Phlebitis after infusion 03/18/2023   Pinched nerve in neck    PND (post-nasal drip) 12/18/2022   Rapid atrial fibrillation (HCC) 07/06/2023   Sinus node dysfunction (HCC)    Sinus pressure 12/18/2022   Sleep apnea 1990's   wears CPAP   Stroke (HCC)    T12 compression  fracture (HCC) 11/2015   Vitamin D  deficiency    Whiplash injury 06/07/2010   Past Surgical History:  Procedure Laterality Date   APPENDECTOMY  1990s   ATRIAL FIBRILLATION ABLATION N/A 03/25/2016   Procedure: Atrial Fibrillation Ablation;  Surgeon: Lynwood Rakers, MD;  Location: [facility] INVASIVE CV LAB;  Service: Cardiovascular;  Laterality: N/A;   ATRIAL FIBRILLATION ABLATION N/A 01/31/2020   Procedure: ATRIAL FIBRILLATION ABLATION;  Surgeon: Rakers Lynwood, MD;  Location: [facility] INVASIVE CV LAB;  Service: Cardiovascular;  Laterality: N/A;   ATRIAL FIBRILLATION ABLATION N/A 12/13/2021   Procedure: ATRIAL FIBRILLATION ABLATION;  Surgeon: [name], MD;  Location: [facility] INVASIVE CV LAB;  Service: Cardiovascular;  Laterality: N/A;   AV NODE ABLATION N/A 09/15/2023   Procedure: AV NODE ABLATION;  Surgeon: [name], MD;  Location: [facility] INVASIVE CV LAB;  Service: Cardiovascular;  Laterality: N/A;   CARDIOVERSION N/A 07/09/2023   Procedure: CARDIOVERSION;  Surgeon: Okey Vina GAILS, MD;  Location: [facility] INVASIVE CV LAB;  Service: Cardiovascular;  Laterality: N/A;   COLONOSCOPY     FOREARM FRACTURE SURGERY Left ~ 02/2011   broke arm; shattered wrist   FOREARM HARDWARE REMOVAL Left ~ 07/2011   implantable loop recorder placement  03/07/2019   [employer] Reveal  Linq model LNQ 22 implantable loop recorder ([id number]) implanted by Dr Kelsie for Afib management   INSERT / REPLACE / REMOVE PACEMAKER  2025   LOOP RECORDER REMOVAL N/A 03/09/2023   Procedure: LOOP RECORDER REMOVAL;  Surgeon: Kennyth Chew, MD;  Location: [facility] INVASIVE CV LAB;  Service: Cardiovascular;  Laterality: N/A;   PACEMAKER IMPLANT N/A 03/09/2023   Procedure: PACEMAKER IMPLANT;  Surgeon: Kennyth Chew, MD;  Location: [facility] INVASIVE CV LAB;  Service: Cardiovascular;  Laterality: N/A;   Spinal Nerve Ablation     TEE WITHOUT CARDIOVERSION N/A 03/24/2016   Procedure: TRANSESOPHAGEAL ECHOCARDIOGRAM (TEE);  Surgeon: Jerel Balding, MD;   Location: [facility] ENDOSCOPY;  Service: Cardiovascular;  Laterality: N/A;   Patient Active Problem List   Diagnosis Date Noted   Hypertensive urgency 08/28/2023   Hypokalemia 08/28/2023   Acute on chronic systolic CHF (congestive heart failure) (HCC) 08/27/2023   Chronic obstructive pulmonary disease with acute exacerbation (HCC) 07/14/2023   Chronic HFrEF (heart failure with reduced ejection fraction) (HCC) 07/06/2023   Atrial fibrillation with RVR (HCC) 03/07/2023   History of tachycardia-bradycardia syndrome (HCC) 03/07/2023   Demand ischemia (HCC) 03/07/2023   Current severe episode of major depressive disorder without psychotic features without prior episode (HCC) 03/06/2022   GAD (generalized anxiety disorder) 03/06/2022   Caregiver role strain 03/06/2022   Adjustment disorder with mixed anxiety and depressed mood 03/03/2022   Diplopia 01/15/2022   History of CVA (cerebrovascular accident) 01/15/2022   Thoracic spine pain 11/22/2021   Tinnitus of both ears 11/22/2021   Bilateral lower extremity edema 11/22/2021   Acute CVA (cerebrovascular accident) (HCC) 02/06/2021   Dyslipidemia, goal LDL below 130 07/10/2020   Iron  deficiency anemia 07/10/2020   Shortness of breath 12/23/2019   Asthma 12/23/2019   Morbid obesity (HCC) 09/11/2017   Chronic fatigue 05/27/2016   A-fib (HCC) 03/25/2016   Typical atrial flutter (HCC)    Chronic diastolic CHF (congestive heart failure) (HCC) 12/08/2015   Atrial fibrillation with rapid ventricular response (HCC) 12/08/2015   Thoracic compression fracture (HCC) 11/16/2015   Orthostatic hypotension 11/15/2015   Osteopenia 09/12/2015   Vitamin D  deficiency disease 08/29/2015   Polymyalgia rheumatica 04/18/2015   OSA (obstructive sleep apnea) 03/04/2015   Myalgia 11/22/2014   Primary hypertension 08/21/2014   Anxiety and depression 08/21/2014    ONSET DATE: 08/31/23  REFERRING DIAG:  Mobility status PT Visit Diagnosis: Unsteadiness on feet  (R26.81);Muscle weakness (generalized) (M62.81)     THERAPY DIAG:  Muscle weakness (generalized)  Gait instability  Other abnormalities of gait and mobility  Shortness of breath  Rationale for Evaluation and Treatment: Rehabilitation  SUBJECTIVE:  SUBJECTIVE STATEMENT: Pt states she is a little short of breath today which she usually experiences intermittently but reports the colder weather has made it re-occur. Pt also states she is having some low grade pain in the R BLE today.   PERTINENT HISTORY: long history of atrial fibrillation with multiple prior catheter ablations, history of heart failure, dyspnea History of CVA  PAIN:  Are you having pain yes down RT leg PRECAUTIONS: None  RED FLAGS: None   WEIGHT BEARING RESTRICTIONS: No  FALLS: Has patient fallen in last 6 months? Yes. Number of falls 1, was moving houses and tripped over a step   LIVING ENVIRONMENT: Lives with: lives with their daughter Lives in: House/apartment Stairs: Yes: Internal: full set but does not go up them steps; on right going up and External: 3 steps; can reach both Has following equipment at home: Vannie - 2 wheeled, Environmental Consultant - 4 wheeled, and Wheelchair (manual)  PLOF: Independent, Independent with basic ADLs, Independent with community mobility without device, and Independent with gait  PATIENT GOALS: whatever you can do to make me better physically   OBJECTIVE:  Note: Objective measures were completed at Evaluation unless otherwise noted.  DIAGNOSTIC FINDINGS: N/A  COGNITION: Overall cognitive status: Within functional limits for tasks assessed   SENSATION: Light touch: some numbness in toes and spreads to feet   COORDINATION: Decrease balance and coordination   POSTURE: rounded shoulders  LOWER  EXTREMITY ROM:  WFL BLE, some pain with R hip flexion   LOWER EXTREMITY MMT:    MMT Right Eval Left Eval  Hip flexion 3+ 4  Hip extension    Hip abduction 4+ 5  Hip adduction    Hip internal rotation    Hip external rotation    Knee flexion 5 5  Knee extension 4 4+  Ankle dorsiflexion    Ankle plantarflexion    Ankle inversion    Ankle eversion    (Blank rows = not tested)   STAIRS: Findings: Level of Assistance: Modified independence and Stair Negotiation Technique: Step to Pattern with Bilateral Rails GAIT: Findings: Gait Characteristics: narrow BOS, poor foot clearance- Right, and poor foot clearance- Left, unstable especially with turns, unsteady on her feet   FUNCTIONAL TESTS:  5 times sit to stand: 16s Timed up and go (TUG): 11.30s 3 minute walk test: 664ft 97%, 80bpm BERG 45/56                                                                                                                               TREATMENT DATE:   12/15/23 NuStep L4 x  Calf Stretch elevated board HS stretch (straight leg) FABER Stretch Lumbar rotation stretch  Stability ball rollouts for low back  Glute Bridges 2x10 Hip abduction Rtband 2x10 Hip adduction with ball 2x10 Trunk ext Btband 2x10 Resisted gait 4 way 10# Toe Taps 4 2x20  12/10/23 PROM stretching- HS, glute, figure 4, LTR, hip flexor, SKTC,  butterfly stretch. STM to RT ADD. ADD ball squeeze 10 x then 10 x with bridge Clams blue tband 2 sets 10 then 10 x with bridge Feet on ball bridge, KTC and obl 20 x Isometric abdominals 15 x hold 3 sec STS with wt ball 10 x chest press 10 x OH 4 inch lateral step up with opp leg abd 12 x BIL,UE support Leg press 30# 3 sets 10 feet 3 ways for hips Leg ext 10# 2x10 HS curls 25# 2x10 Resisted gait laterally 5 x each way    12/08/23 PROM stretching- HS, glute, figure 4, LTR, hip flexor, SKTC, butterfly stretch Clams blue tband 2 sets 10 Supine hip flexion blue tband 2 sets  10 Feet on ball bridge, KTC and obl 20 x Isometric abdominals 15 x hold 3 sec Green tband hip IR and ER  2 sets 10 ADD ball squeeze with bridge 12 x Bridge with ABD blue tband 12 x Side stepping with green tband around thighs 10 feet 2 x each way Lateral step ups 4 inch with rails Leg ext 10# 2x10 HS curls 25# 2x10  11/25/23 PROM stretching- HS, glute, figure 4, LTR, hip flexor, SKTC, butterfly stretch  FABER stretch  Feet on pball rotations and knees to chest 2x10 AB isometric with ball 2x10 Bridges with adduction 2x10 Seated HS and piriformis stretch BLE Resisted IR against band 2x10  11/24/23 Goals assessed and document- BERG and Nustep L 5 Resisted gait 5 x 4 ways 20 # Green tband 2 sets 10 - abd,flex ,IR/ER PROM and stretching RT LE esp HS,quad and hip flexor and ABD STW to above     11/13/23 Adjusted HEP for core stab- see below. Performed and cued PROM and stretching RT LE esp HS,quad and hip flexor and ABD STW to above     11/06/23 Nustep L 5 PROM and stretching RT LE esp HS,quad and hip flexor and ABD STW to above with and without theragun Stretching and issued for home- supine RT leg off bed hang, clam stretch supine, standing HS and ABD Pelvic ROM on dyna disc, isometric RT hip strengthening ( flex,abd,add,IR,ER)      11/04/23 Walking laps holding 3# weights  Step ups 6 Walking on beam NuStep L5x22mins Leg ext 10# 2x10 HS curls 25# 2x10 STS holding yellow ball on airex 2x10   10/27/23 Nustep L 5 5 min HS curl 25# 2 sets 10 Knee ext 10# 2 sets 10 Seated row 20# 2 sets 10 Seated resisted trunk ext 2 sets 10 Leg press 30# 2 sets 10. Calf raises 30# 2 sets 10 STS with wt ball chest press 10 x Mech lumbar traction  60 #  10/15/23 Nustep L 5 O2  98 HR 86 STS 5 x 11.5  450 feet 3 min PRE 9/10, O2 100. HR 73 STS with wt ball chest press 10 x 6 inch step up with opp leg ext 10 xBIL with UE 6 inch lateral step up with opp  leg abd 10 x BIL with UE HS Curls 25lb 2x10 Leg Ext 10lb 2x10 Seated row 20# 2 sets 10 Seated resisted trunk ext 2 sets 10  10/13/23 Bike L 3 O2  98    HR  80 STS with wt ball press 10 x 2 sets Resisted gait 30# 5 x fwd and back and 3 x each side Leg press 30# 2 sets 10 ( seat on 7, tried 6 but too difficult) 6 in  step ups with light HHA 10 x BIL HS Curls 25lb 2x10 Leg Ext 10lb 2x10 6# farmer carry 1 lap each hand Stepping over objects CGA     10/07/23 Bike L 2.5 x 6 min  O2 98%  Goals  Sit to stands holding 2lb 2x10 HS Curls 25lb 2x10 Leg Ext 10lb 2x10 6in step ups x10 each  4in lateral step ups x10 Heel Raises black bar 2x10   10/01/23  Nustep L 2 5 min  O2 after 96 Red tband 2 sets 10 HS curl,LAQ,clams,hip abd and hip flexion STS with 3# chest press 2 sets 5, O2 after 95 Red tband 10 x SL hip flex,ext and abd Feet on ball bridge,KTC and obl  10 x each Iso abdominals with ball 10 x hold 3 sec Resisted gait 3 x 4 ways    09/23/23 EVAL    PATIENT EDUCATION: Education details: POC, HEP Person educated: Patient Education method: Medical Illustrator Education comprehension: verbalized understanding and returned demonstration  HOME EXERCISE PROGRAM:  Access Code: [email or web address] URL: [email or web address] Date: 11/13/2023 Prepared by: Angela Payseur  Exercises - Supine Posterior Pelvic Tilt  - 2 x daily - 7 x weekly - 1 sets - 15 reps - 3 hold - Supine Bridge  - 2 x daily - 7 x weekly - 1 sets - 10 reps - 3 hold - Supine March  - 2 x daily - 7 x weekly - 1 sets - 10 reps - Supine Hip Adduction Isometric with Ball  - 2 x daily - 7 x weekly - 1 sets - 10 reps - 3 hold - Hooklying Clamshell with Resistance  - 2 x daily - 7 x weekly - 1 sets - 10 reps - 3 hold - Supine Pelvic Tilt with Straight Leg Raise  - 2 x daily - 7 x weekly - 1 sets - 15 reps - 3 hold     Access Code: [email or web address] URL: [email or web address] Date:  10/01/2023 Prepared by: Angela Payseur  Exercises - Standing Hip Flexion with Resistance Loop  - 1 x daily - 7 x weekly - 1 sets - 10 reps - Hip Extension with Resistance Loop  - 1 x daily - 7 x weekly - 1 sets - 10 reps - Hip Abduction with Resistance Loop  - 1 x daily - 7 x weekly - 1 sets - 10 reps - Seated Long Arc Quad  - 1 x daily - 7 x weekly - 1 sets - 10 reps - Seated March  - 1 x daily - 7 x weekly - 1 sets - 10 reps - Seated Hip Abduction with Resistance  - 1 x daily - 7 x weekly - 10 reps  Access Code: [email or web address] URL: [email or web address] Date: 09/23/2023 Prepared by: Almetta Fam  Exercises - Sit to Stand  - 1 x daily - 7 x weekly - 2 sets - 10 reps - Standing Single Leg Stance with Counter Support  - 1 x daily - 7 x weekly - 10 reps - 10 hold - Standing Tandem Balance with Counter Support  - 1 x daily - 7 x weekly - 10 reps - 15 hold  GOALS: Goals reviewed with patient? Yes  SHORT TERM GOALS: Target date: 10/28/23  Patient will be independent with initial HEP. Baseline:  Goal status: 10/01/23 MET  2.  Patient will demonstrate improved functional LE strength as demonstrated by 5xSTS <13s. Baseline: 16s Goal status: 14.60 sec Progressing 10/07/23   MET 10/15/23   LONG  TERM GOALS: Target date: 01/07/24  Patient will be independent with advanced/ongoing HEP to improve outcomes and carryover.  Baseline:  Goal status: progressing 10/27/23  and 11/13/23   11/24/23 progressing  and 12/08/23  2.  Patient will be able to ambulate 800' in with less staggering and unsteadiness Baseline: 622ft some SOB and needs SBA Goal status: 10/15/23 450 feet 3 min PRE 9/10. Limited today d/t SOB/cough from virus last week 10/27/23.  11/24/23 4 1/2 mins SOB and PRE 8/10, no unsteadiness but fatigue MET  3.  Patient will score 53 on Berg Balance test to demonstrate lower risk of falls. (MCID= 8 points) .  Baseline: 45 Goal status: 11/24/23 50/56  and 12/08/23  4.  Patient  will able to return to her normal household chores/routine without SOB and fatigue.  Baseline:  Goal status: progressing 10/15/23  and 10/27/23, ongoing 11/26/23 and 12/08/23  5.  Patient will reports decrease in pain in R hip and groin <2/10. Baseline: 7/10 at worst Goal status: ongoing 10/07/23  10/27/23 progressing  and  11/06/23  11/24/23 baseline better but pain spikes with actvities 12/08/23 70% better  ASSESSMENT:  CLINICAL IMPRESSION:  Pt reports doing her exercises at home and has shown carryover with ability to increase exercise quality like elevating his higher during bridges and tolerating standing activities for a longer period of time with shorter rest periods. Pt experiences pain in the back and RLE continue to stretch tight musculature surrounding the low back and hips and progressing strength in BLE to increase gait endurance.   Patient is a 73 y.o. female who was seen today for physical therapy evaluation and treatment for generalized weakness. She has an extensive medical history of heart issues, including a-fib and heart failure. She reports being sick in June and everything went downhill since then. Recent AV node ablation was on 09/15/23. Pt reports difficulty with her normal activities due to fatigue, shortness or breath, and weakness. She reports some staggering and wobbling when walking but she also thinks she has some vision problems that contribute to this. She does have a walker that she uses for longer distances. With functional tests completed today, she does present as a fall risk. Therefore, she will benefit from skilled PT to address weakness, endurance, gait, and balance impairments to decrease risk for fall and help her return to PLOF.  OBJECTIVE IMPAIRMENTS: Abnormal gait, cardiopulmonary status limiting activity, decreased activity tolerance, decreased balance, decreased coordination, decreased endurance, decreased strength, increased edema, and pain.   ACTIVITY  LIMITATIONS: carrying, squatting, stairs, and locomotion level  PARTICIPATION LIMITATIONS: meal prep, cleaning, laundry, shopping, community activity, and yard work  PERSONAL FACTORS: Age, Fitness, and 1-2 comorbidities: a-fib, heart failure are also affecting patient's functional outcome.   REHAB POTENTIAL: Good  CLINICAL DECISION MAKING: Stable/uncomplicated  EVALUATION COMPLEXITY: Low  PLAN:  PT FREQUENCY: 2x/week  PT DURATION: 10 weeks  PLANNED INTERVENTIONS: 97110-Therapeutic exercises, 97530- Therapeutic activity, 97112- Neuromuscular re-education, 97535- Self Care, [id number]- Manual therapy, [phone]- Gait training, Patient/Family education, Balance training, Stair training, Cryotherapy, and Moist heat  PLAN FOR NEXT SESSION:assess and progress   Thersia Alder, Student-PT 12/15/2023, 11:47 AM

## 2023-12-17 ENCOUNTER — Ambulatory Visit

## 2023-12-17 DIAGNOSIS — R2689 Other abnormalities of gait and mobility: Secondary | ICD-10-CM

## 2023-12-17 DIAGNOSIS — M6281 Muscle weakness (generalized): Secondary | ICD-10-CM

## 2023-12-17 NOTE — Therapy (Signed)
 OUTPATIENT PHYSICAL THERAPY NEURO    Patient Name: Shannon [name] MRN: [id number] DOB:Nov 23, 1950, 73 y.o., female Today's Date: 12/17/2023  PCP: Thersia Stark REFERRING PROVIDER: Toribio Furnace  END OF SESSION:  PT End of Session - 12/17/23 1043     Visit Number 15    Date for Recertification  02/05/24    Authorization Type [employer] Medicare    PT Start Time 1044    PT Stop Time 1130    PT Time Calculation (min) 46 min    Activity Tolerance Patient tolerated treatment well    Behavior During Therapy [facility] for tasks assessed/performed          Past Medical History:  Diagnosis Date   Acute systolic heart failure (HCC) 07/08/2023   Allergy    Anemia    Anxiety    Arthritis 1990s   neck and lower back (03/25/2016)   Asthma 1990s X 1   short term inhaler use    CAD (coronary artery disease)    Cardiac pacemaker in situ    MDT   CHF (congestive heart failure) (HCC)    Chronic lower back pain    COVID-19 03/06/2022   Degenerative disorder of bone    Depression    Diastolic dysfunction    Drug-induced lupus erythematosus    HCTZ induced; still gettin over it (03/25/2016)   Foot swelling 05/19/2022   GERD (gastroesophageal reflux disease) 1990s   Headache, unspecified headache type 03/06/2022   Herniated disc, cervical    Hospital discharge follow-up 03/14/2022   Hyperlipidemia    Hypertension    Neuromuscular disorder (HCC)    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 HTN   Obesity (BMI 30-39.9) 11/07/2022   Orthostatic hypotension    OSA on CPAP    Osteopenia    Osteoporosis 2012   PAF (paroxysmal atrial fibrillation) (HCC)    Paroxysmal atrial fibrillation (HCC) 12/13/2021   Phlebitis after infusion 03/18/2023   Pinched nerve in neck    PND (post-nasal drip) 12/18/2022   Rapid atrial fibrillation (HCC) 07/06/2023   Sinus node dysfunction (HCC)    Sinus pressure 12/18/2022   Sleep apnea 1990s   wears CPAP   Stroke (HCC)    T12  compression fracture (HCC) 11/2015   Vitamin D  deficiency    Whiplash injury 06/07/2010   Past Surgical History:  Procedure Laterality Date   APPENDECTOMY  1990s   ATRIAL FIBRILLATION ABLATION N/A 03/25/2016   Procedure: Atrial Fibrillation Ablation;  Surgeon: Lynwood Rakers, MD;  Location: [facility] INVASIVE CV LAB;  Service: Cardiovascular;  Laterality: N/A;   ATRIAL FIBRILLATION ABLATION N/A 01/31/2020   Procedure: ATRIAL FIBRILLATION ABLATION;  Surgeon: Rakers Lynwood, MD;  Location: [facility] INVASIVE CV LAB;  Service: Cardiovascular;  Laterality: N/A;   ATRIAL FIBRILLATION ABLATION N/A 12/13/2021   Procedure: ATRIAL FIBRILLATION ABLATION;  Surgeon: [name], MD;  Location: [facility] INVASIVE CV LAB;  Service: Cardiovascular;  Laterality: N/A;   AV NODE ABLATION N/A 09/15/2023   Procedure: AV NODE ABLATION;  Surgeon: [name], MD;  Location: [facility] INVASIVE CV LAB;  Service: Cardiovascular;  Laterality: N/A;   CARDIOVERSION N/A 07/09/2023   Procedure: CARDIOVERSION;  Surgeon: Okey Vina GAILS, MD;  Location: [facility] INVASIVE CV LAB;  Service: Cardiovascular;  Laterality: N/A;   COLONOSCOPY     FOREARM FRACTURE SURGERY Left ~ 02/2011   broke arm; shattered wrist   FOREARM HARDWARE REMOVAL Left ~ 07/2011   implantable loop recorder placement  03/07/2019   [employer]  Reveal Linq model LNQ 22 implantable loop recorder [id number]) implanted by Dr Kelsie for Afib management   INSERT / REPLACE / REMOVE PACEMAKER  2025   LOOP RECORDER REMOVAL N/A 03/09/2023   Procedure: LOOP RECORDER REMOVAL;  Surgeon: Kennyth Chew, MD;  Location: [facility] INVASIVE CV LAB;  Service: Cardiovascular;  Laterality: N/A;   PACEMAKER IMPLANT N/A 03/09/2023   Procedure: PACEMAKER IMPLANT;  Surgeon: Kennyth Chew, MD;  Location: [facility] INVASIVE CV LAB;  Service: Cardiovascular;  Laterality: N/A;   Spinal Nerve Ablation     TEE WITHOUT CARDIOVERSION N/A 03/24/2016   Procedure: TRANSESOPHAGEAL ECHOCARDIOGRAM (TEE);  Surgeon: Jerel Balding,  MD;  Location: [facility] ENDOSCOPY;  Service: Cardiovascular;  Laterality: N/A;   Patient Active Problem List   Diagnosis Date Noted   Hypertensive urgency 08/28/2023   Hypokalemia 08/28/2023   Acute on chronic systolic CHF (congestive heart failure) (HCC) 08/27/2023   Chronic obstructive pulmonary disease with acute exacerbation (HCC) 07/14/2023   Chronic HFrEF (heart failure with reduced ejection fraction) (HCC) 07/06/2023   Atrial fibrillation with RVR (HCC) 03/07/2023   History of tachycardia-bradycardia syndrome (HCC) 03/07/2023   Demand ischemia (HCC) 03/07/2023   Current severe episode of major depressive disorder without psychotic features without prior episode (HCC) 03/06/2022   GAD (generalized anxiety disorder) 03/06/2022   Caregiver role strain 03/06/2022   Adjustment disorder with mixed anxiety and depressed mood 03/03/2022   Diplopia 01/15/2022   History of CVA (cerebrovascular accident) 01/15/2022   Thoracic spine pain 11/22/2021   Tinnitus of both ears 11/22/2021   Bilateral lower extremity edema 11/22/2021   Acute CVA (cerebrovascular accident) (HCC) 02/06/2021   Dyslipidemia, goal LDL below 130 07/10/2020   Iron  deficiency anemia 07/10/2020   Shortness of breath 12/23/2019   Asthma 12/23/2019   Morbid obesity (HCC) 09/11/2017   Chronic fatigue 05/27/2016   A-fib (HCC) 03/25/2016   Typical atrial flutter (HCC)    Chronic diastolic CHF (congestive heart failure) (HCC) 12/08/2015   Atrial fibrillation with rapid ventricular response (HCC) 12/08/2015   Thoracic compression fracture (HCC) 11/16/2015   Orthostatic hypotension 11/15/2015   Osteopenia 09/12/2015   Vitamin D  deficiency disease 08/29/2015   Polymyalgia rheumatica 04/18/2015   OSA (obstructive sleep apnea) 03/04/2015   Myalgia 11/22/2014   Primary hypertension 08/21/2014   Anxiety and depression 08/21/2014    ONSET DATE: 08/31/23  REFERRING DIAG:  Mobility status PT Visit Diagnosis: Unsteadiness on feet  (R26.81);Muscle weakness (generalized) (M62.81)     THERAPY DIAG:  Muscle weakness (generalized)  Other abnormalities of gait and mobility  Rationale for Evaluation and Treatment: Rehabilitation  SUBJECTIVE:  SUBJECTIVE STATEMENT: I am better than I was Tuesday. Still don't feel like I am where I need to be. I want to work on my core and arm strengthening.   PERTINENT HISTORY: long history of atrial fibrillation with multiple prior catheter ablations, history of heart failure, dyspnea History of CVA  PAIN:  Are you having pain yes down RT leg PRECAUTIONS: None  RED FLAGS: None   WEIGHT BEARING RESTRICTIONS: No  FALLS: Has patient fallen in last 6 months? Yes. Number of falls 1, was moving houses and tripped over a step   LIVING ENVIRONMENT: Lives with: lives with their daughter Lives in: House/apartment Stairs: Yes: Internal: full set but does not go up them steps; on right going up and External: 3 steps; can reach both Has following equipment at home: Vannie - 2 wheeled, Environmental Consultant - 4 wheeled, and Wheelchair (manual)  PLOF: Independent, Independent with basic ADLs, Independent with community mobility without device, and Independent with gait  PATIENT GOALS: whatever you can do to make me better physically   OBJECTIVE:  Note: Objective measures were completed at Evaluation unless otherwise noted.  DIAGNOSTIC FINDINGS: N/A  COGNITION: Overall cognitive status: Within functional limits for tasks assessed   SENSATION: Light touch: some numbness in toes and spreads to feet   COORDINATION: Decrease balance and coordination   POSTURE: rounded shoulders  LOWER EXTREMITY ROM:  WFL BLE, some pain with R hip flexion   LOWER EXTREMITY MMT:    MMT Right Eval Left Eval  Hip flexion 3+  4  Hip extension    Hip abduction 4+ 5  Hip adduction    Hip internal rotation    Hip external rotation    Knee flexion 5 5  Knee extension 4 4+  Ankle dorsiflexion    Ankle plantarflexion    Ankle inversion    Ankle eversion    (Blank rows = not tested)   STAIRS: Findings: Level of Assistance: Modified independence and Stair Negotiation Technique: Step to Pattern with Bilateral Rails GAIT: Findings: Gait Characteristics: narrow BOS, poor foot clearance- Right, and poor foot clearance- Left, unstable especially with turns, unsteady on her feet   FUNCTIONAL TESTS:  5 times sit to stand: 16s Timed up and go (TUG): 11.30s 3 minute walk test: 623ft 97%, 80bpm BERG 45/56                                                                                                                               TREATMENT DATE:  12/17/23 NuStep L5x32mins  Goals STS holding yellow ball x10, then with chest press x5  Trunk rotations with green band 2x10 PROM stretching- HS, glute, figure 4, LTR, hip flexor, SKTC. STM to R adductor and groin area Resisted LTR x10 each side SLR 2# x10, then with hip abd 2x5 Tandem 30s BLE SLS 5s BLE    12/15/23 NuStep L4 x  Calf Stretch elevated board HS stretch (straight leg) FABER  Stretch Lumbar rotation stretch  Stability ball rollouts for low back  Glute Bridges 2x10 Hip abduction Rtband 2x10 Hip adduction with ball 2x10 Trunk ext Btband 2x10 Resisted gait 4 way 10# Toe Taps 4 2x20  12/10/23 PROM stretching- HS, glute, figure 4, LTR, hip flexor, SKTC, butterfly stretch. STM to RT ADD. ADD ball squeeze 10 x then 10 x with bridge Clams blue tband 2 sets 10 then 10 x with bridge Feet on ball bridge, KTC and obl 20 x Isometric abdominals 15 x hold 3 sec STS with wt ball 10 x chest press 10 x OH 4 inch lateral step up with opp leg abd 12 x BIL,UE support Leg press 30# 3 sets 10 feet 3 ways for hips Leg ext 10# 2x10 HS curls 25# 2x10 Resisted  gait laterally 5 x each way    12/08/23 PROM stretching- HS, glute, figure 4, LTR, hip flexor, SKTC, butterfly stretch Clams blue tband 2 sets 10 Supine hip flexion blue tband 2 sets 10 Feet on ball bridge, KTC and obl 20 x Isometric abdominals 15 x hold 3 sec Green tband hip IR and ER  2 sets 10 ADD ball squeeze with bridge 12 x Bridge with ABD blue tband 12 x Side stepping with green tband around thighs 10 feet 2 x each way Lateral step ups 4 inch with rails Leg ext 10# 2x10 HS curls 25# 2x10  11/25/23 PROM stretching- HS, glute, figure 4, LTR, hip flexor, SKTC, butterfly stretch  FABER stretch  Feet on pball rotations and knees to chest 2x10 AB isometric with ball 2x10 Bridges with adduction 2x10 Seated HS and piriformis stretch BLE Resisted IR against band 2x10  11/24/23 Goals assessed and document- BERG and Nustep L 5 Resisted gait 5 x 4 ways 20 # Green tband 2 sets 10 - abd,flex ,IR/ER PROM and stretching RT LE esp HS,quad and hip flexor and ABD STW to above     11/13/23 Adjusted HEP for core stab- see below. Performed and cued PROM and stretching RT LE esp HS,quad and hip flexor and ABD STW to above     11/06/23 Nustep L 5 PROM and stretching RT LE esp HS,quad and hip flexor and ABD STW to above with and without theragun Stretching and issued for home- supine RT leg off bed hang, clam stretch supine, standing HS and ABD Pelvic ROM on dyna disc, isometric RT hip strengthening ( flex,abd,add,IR,ER)    11/04/23 Walking laps holding 3# weights  Step ups 6 Walking on beam NuStep L5x29mins Leg ext 10# 2x10 HS curls 25# 2x10 STS holding yellow ball on airex 2x10   10/27/23 Nustep L 5 5 min HS curl 25# 2 sets 10 Knee ext 10# 2 sets 10 Seated row 20# 2 sets 10 Seated resisted trunk ext 2 sets 10 Leg press 30# 2 sets 10. Calf raises 30# 2 sets 10 STS with wt ball chest press 10 x Mech lumbar traction  60 #  10/15/23 Nustep L 5  O2  98 HR 86 STS 5 x 11.5  450 feet 3 min PRE 9/10, O2 100. HR 73 STS with wt ball chest press 10 x 6 inch step up with opp leg ext 10 xBIL with UE 6 inch lateral step up with opp leg abd 10 x BIL with UE HS Curls 25lb 2x10 Leg Ext 10lb 2x10 Seated row 20# 2 sets 10 Seated resisted trunk ext 2 sets 10  10/13/23 Bike L 3 O2  98    HR  80 STS with wt ball press 10 x 2 sets Resisted gait 30# 5 x fwd and back and 3 x each side Leg press 30# 2 sets 10 ( seat on 7, tried 6 but too difficult) 6 in step ups with light HHA 10 x BIL HS Curls 25lb 2x10 Leg Ext 10lb 2x10 6# farmer carry 1 lap each hand Stepping over objects CGA     10/07/23 Bike L 2.5 x 6 min  O2 98%  Goals  Sit to stands holding 2lb 2x10 HS Curls 25lb 2x10 Leg Ext 10lb 2x10 6in step ups x10 each  4in lateral step ups x10 Heel Raises black bar 2x10   10/01/23  Nustep L 2 5 min  O2 after 96 Red tband 2 sets 10 HS curl,LAQ,clams,hip abd and hip flexion STS with 3# chest press 2 sets 5, O2 after 95 Red tband 10 x SL hip flex,ext and abd Feet on ball bridge,KTC and obl  10 x each Iso abdominals with ball 10 x hold 3 sec Resisted gait 3 x 4 ways    09/23/23 EVAL    PATIENT EDUCATION: Education details: POC, HEP Person educated: Patient Education method: Medical Illustrator Education comprehension: verbalized understanding and returned demonstration  HOME EXERCISE PROGRAM:  Access Code: [email or web address] URL: [email or web address] Date: 11/13/2023 Prepared by: Angela Payseur  Exercises - Supine Posterior Pelvic Tilt  - 2 x daily - 7 x weekly - 1 sets - 15 reps - 3 hold - Supine Bridge  - 2 x daily - 7 x weekly - 1 sets - 10 reps - 3 hold - Supine March  - 2 x daily - 7 x weekly - 1 sets - 10 reps - Supine Hip Adduction Isometric with Ball  - 2 x daily - 7 x weekly - 1 sets - 10 reps - 3 hold - Hooklying Clamshell with Resistance  - 2 x daily - 7 x weekly - 1 sets - 10 reps - 3  hold - Supine Pelvic Tilt with Straight Leg Raise  - 2 x daily - 7 x weekly - 1 sets - 15 reps - 3 hold     Access Code: [email or web address] URL: [email or web address] Date: 10/01/2023 Prepared by: Angela Payseur  Exercises - Standing Hip Flexion with Resistance Loop  - 1 x daily - 7 x weekly - 1 sets - 10 reps - Hip Extension with Resistance Loop  - 1 x daily - 7 x weekly - 1 sets - 10 reps - Hip Abduction with Resistance Loop  - 1 x daily - 7 x weekly - 1 sets - 10 reps - Seated Long Arc Quad  - 1 x daily - 7 x weekly - 1 sets - 10 reps - Seated March  - 1 x daily - 7 x weekly - 1 sets - 10 reps - Seated Hip Abduction with Resistance  - 1 x daily - 7 x weekly - 10 reps  Access Code: [email or web address] URL: [email or web address] Date: 09/23/2023 Prepared by: Almetta Fam  Exercises - Sit to Stand  - 1 x daily - 7 x weekly - 2 sets - 10 reps - Standing Single Leg Stance with Counter Support  - 1 x daily - 7 x weekly - 10 reps - 10 hold - Standing Tandem Balance with Counter Support  - 1 x daily - 7 x weekly - 10 reps - 15 hold  GOALS: Goals reviewed with patient? Yes  SHORT TERM GOALS: Target  date: 10/28/23  Patient will be independent with initial HEP. Baseline:  Goal status: 10/01/23 MET  2.  Patient will demonstrate improved functional LE strength as demonstrated by 5xSTS <13s. Baseline: 16s Goal status: 14.60 sec Progressing 10/07/23   MET 10/15/23   LONG TERM GOALS: Target date: 02/05/24  Patient will be independent with advanced/ongoing HEP to improve outcomes and carryover.  Baseline:  Goal status: progressing 10/27/23  and 11/13/23   11/24/23 progressing  and 12/08/23  2.  Patient will be able to ambulate 800' in with less staggering and unsteadiness Baseline: 621ft some SOB and needs SBA Goal status: 10/15/23 450 feet 3 min PRE 9/10. Limited today d/t SOB/cough from virus last week 10/27/23.  11/24/23 4 1/2 mins SOB and PRE 8/10, no unsteadiness but  fatigue MET  3.  Patient will score 53 on Berg Balance test to demonstrate lower risk of falls. (MCID= 8 points) .  Baseline: 45 Goal status: 11/24/23 50/56  and 12/08/23  4.  Patient will able to return to her normal household chores/routine without SOB and fatigue.  Baseline:  Goal status: progressing 10/15/23  and 10/27/23, ongoing 11/26/23 and 12/08/23, ongoing 12/17/23  5.  Patient will reports decrease in pain in R hip and groin <2/10. Baseline: 7/10 at worst Goal status: ongoing 10/07/23  10/27/23 progressing  and  11/06/23  11/24/23 baseline better but pain spikes with actvities 12/08/23 70% better 12/17/23 5/10 ongoing   ASSESSMENT:  CLINICAL IMPRESSION:  Pt reports ongoing pain in her leg, it has gotten better since the beginning PT but it still hurts especially if she is on it too long or if she is inactive for too long. She also reports ongoing difficulty with household chores, especially lifting the laundry basket. Pt feels her arms are weak and it takes her breath away. As of lately her back has also been giving her problems. She would also like to continue working on core strength as well as stretching for her leg as she feels this has really been helping her. Pt does have some SOB with activities that require more effort.   Patient is a 73 y.o. female who was seen today for physical therapy evaluation and treatment for generalized weakness. She has an extensive medical history of heart issues, including a-fib and heart failure. She reports being sick in June and everything went downhill since then. Recent AV node ablation was on 09/15/23. Pt reports difficulty with her normal activities due to fatigue, shortness or breath, and weakness. She reports some staggering and wobbling when walking but she also thinks she has some vision problems that contribute to this. She does have a walker that she uses for longer distances. With functional tests completed today, she does present as a fall  risk. Therefore, she will benefit from skilled PT to address weakness, endurance, gait, and balance impairments to decrease risk for fall and help her return to PLOF.  OBJECTIVE IMPAIRMENTS: Abnormal gait, cardiopulmonary status limiting activity, decreased activity tolerance, decreased balance, decreased coordination, decreased endurance, decreased strength, increased edema, and pain.   ACTIVITY LIMITATIONS: carrying, squatting, stairs, and locomotion level  PARTICIPATION LIMITATIONS: meal prep, cleaning, laundry, shopping, community activity, and yard work  PERSONAL FACTORS: Age, Fitness, and 1-2 comorbidities: a-fib, heart failure are also affecting patient's functional outcome.   REHAB POTENTIAL: Good  CLINICAL DECISION MAKING: Stable/uncomplicated  EVALUATION COMPLEXITY: Low  PLAN:  PT FREQUENCY: 2x/week  PT DURATION: 10 weeks  PLANNED INTERVENTIONS: 97110-Therapeutic exercises, 97530- Therapeutic activity, 97112-  Neuromuscular re-education, [phone]- Self Care, [id number]- Manual therapy, [phone]- Gait training, Patient/Family education, Balance training, Stair training, Cryotherapy, and Moist heat  PLAN FOR NEXT SESSION:assess and progress   Almetta Fam, PT 12/17/2023, 11:33 AM

## 2023-12-18 ENCOUNTER — Encounter: Payer: Self-pay | Admitting: Behavioral Health

## 2023-12-18 ENCOUNTER — Ambulatory Visit: Admitting: Behavioral Health

## 2023-12-18 DIAGNOSIS — F4323 Adjustment disorder with mixed anxiety and depressed mood: Secondary | ICD-10-CM

## 2023-12-18 NOTE — Progress Notes (Signed)
 [facility] Counselor/Therapist Progress Note  Patient ID: Shannon [name], MRN: [id number],    Date: December 18, 2023  Time Spent: 3:02 PM until 3:59 PM, 57 minutes.   This session was held via audio teletherapy.  The patient attempted to connect through video telehealth and could see and hear me but I could not see or hear her.  She attempted multiple times as did this therapist but could not connect via video so we switched to audio.  The patient consented to the audio teletherapy and was located in her home during this session. She is aware it is the responsibility of the patient to secure confidentiality on her end of the session. The provider was in a private home office for the duration of this session.     Reported Symptoms: Anxiety, depression  Mental Status Exam: Appearance:  Well Groomed     Behavior: Appropriate  Motor: Normal  Speech/Language:  Clear and Coherent  Affect: Appropriate for  Mood: normal  Thought process: normal  Thought content:   WNL  Sensory/Perceptual disturbances:   WNL  Orientation: oriented to person, place, time/date, situation, day of week, month of year, and year  Attention: Good  Concentration: Good  Memory: WNL  Fund of knowledge:  Good  Insight:   Good  Judgment:  Good  Impulse Control: Good   Risk Assessment: Danger to Self:  No Self-injurious Behavior: No Danger to Others: No Duty to Warn:no Physical Aggression / Violence:No  Access to Firearms a concern: No  Gang Involvement:No   Subjective: The patient is feeling better physically.  She says for the first time in a week she her daughter and her grandson finally feel like they have gotten past the virus that they had.  She is feeling better overall but still has significant pain in her leg.  The physical therapy helps but it does not last long term and she feels that the cardiology progress where she has been is changing.  She has been through multiple  cardiologist or cardiologist physicians assistant in the past year.  She feels like something might be changing with the practice because she is not getting the type of care that she needs.  No one has looked at the leg since they did the procedure several months ago.  Encouraged her to reach out to the office manager to see if she can get some answers and find someone that she is comfortable with.  She also says that she continues to struggle with moving past her from all that her husband did over the years.  She says at times she beat herself up because she cannot understand why she stayed in the marriage as long as she did.  She cannot understand why he could take everything from she and the kids financially and otherwise for years and not have any regrets.  She knows her family members who do not know the full story who thinks that she had the ideal marriage.  We talked about not trying to control other people's narratives if she has spoken the truth and that she and her kids know the truth.  We focused on the fact that people who love her most to care about her most believe her dad knows what she went through.  We also talked about why she made the decisions for years with in the marriage to keep her family intact and challenged her not to look at it from current perspective. She does contract for safety  having no thoughts of hurting herself or anyone else. Interventions: Cognitive Behavioral Therapy  Diagnosis: Adjustment disorder with mixed anxiety and depressed mood.  Plan: I will meet with the patient every 2 weeks via video session TX. Plan:To use cognitive behavioral therapy principles as well as elements of dialectical behavior therapy.  Goals are to reduce anxiety and depression by at least 50% with a target date of November 06, 2022.  Goals are to have less sadness as indicated by PH-9 scores as well as patient report.  We also work on improving mood and return to a healthier level of  functioning as defined by her goals for being happy, identify causes and process triggers for depressed mood.  We will use cognitive behavioral therapy to explore and replace unhealthy thoughts and behavior patterns contributing to depression.  I will continue to encourage shearing of feelings related to the causes and symptoms of depression as well as teach and encouraged use of coping skills for management of depressive symptoms.  We also will work to improve the patient's ability to manage anxiety symptoms and better handle stress, identify causes for anxiety and explore ways for reduction of anxiety in addition to resolving conflicts contributing to anxiety and managing thoughts and worrisome thinking is contributing to anxiety.  Interventions will include providing education about anxiety, facilitate problem solution skills, teaching coping skills for managing anxiety such as grounding exercises, progressive muscle relaxation and cognitive re framing etc.  We will also use cognitive behavioral therapy to identify and change anxiety provoking thoughts and behavior patterns as well as using DBT distress tolerance and mindfulness skills. I reviewed the treatment plan goals with the patient who agreed to continue with goals as stated above. Progress: 40%New target date will be May 05, 2024 Lorrene CHRISTELLA Hasten, [facility]                                                Lorrene CHRISTELLA Hasten, [facility]               Lorrene CHRISTELLA Hasten, [facility]               Lorrene CHRISTELLA Hasten, [facility]               Lorrene CHRISTELLA Hasten, [facility]               Lorrene CHRISTELLA Hasten, [facility]               Lorrene CHRISTELLA Hasten, [facility]               Lorrene CHRISTELLA Hasten, [facility]               Lorrene CHRISTELLA Hasten, [facility]               Lorrene CHRISTELLA Hasten, [facility]               Lorrene CHRISTELLA Hasten, [facility]               Lorrene CHRISTELLA Hasten, [facility]               Lorrene CHRISTELLA Hasten, [facility]               Lorrene CHRISTELLA Hasten, [facility]               Lorrene CHRISTELLA Hasten, [facility]  Lorrene CHRISTELLA Hasten, [facility]               Lorrene CHRISTELLA Hasten, [facility]               Lorrene CHRISTELLA Hasten, [facility]               Lorrene CHRISTELLA Hasten, [facility]               Lorrene CHRISTELLA Hasten, [facility]               Lorrene CHRISTELLA Hasten, [facility]               Lorrene CHRISTELLA Hasten, [facility]               Lorrene CHRISTELLA Hasten, [facility]               Lorrene CHRISTELLA Hasten, [facility]               Lorrene CHRISTELLA Hasten, [facility]               Lorrene CHRISTELLA Hasten, [facility]               Lorrene CHRISTELLA Hasten, [facility]               Lorrene CHRISTELLA Hasten, [facility]               Lorrene CHRISTELLA Hasten, [facility]

## 2023-12-22 ENCOUNTER — Telehealth (HOSPITAL_COMMUNITY): Payer: Self-pay | Admitting: Cardiology

## 2023-12-22 ENCOUNTER — Ambulatory Visit

## 2023-12-25 ENCOUNTER — Encounter: Payer: Self-pay | Admitting: Behavioral Health

## 2023-12-25 ENCOUNTER — Ambulatory Visit: Admitting: Behavioral Health

## 2023-12-25 DIAGNOSIS — F4323 Adjustment disorder with mixed anxiety and depressed mood: Secondary | ICD-10-CM

## 2023-12-25 NOTE — Progress Notes (Signed)
 "  [facility] Counselor/Therapist Progress Note  Patient ID: Shannon [name], MRN: [id number],    Date: December 119, 2025  Time Spent: 3:02 PM until 3:59 PM, 57 minutes.   This session was held via audio teletherapy..  The patient consented to the video teletherapy and was located in her home during this session. She is aware it is the responsibility of the patient to secure confidentiality on her end of the session. The provider was in a private home office for the duration of this session.     Reported Symptoms: Anxiety, depression  Mental Status Exam: Appearance:  Well Groomed     Behavior: Appropriate  Motor: Normal  Speech/Language:  Clear and Coherent  Affect: Appropriate for  Mood: normal  Thought process: normal  Thought content:   WNL  Sensory/Perceptual disturbances:   WNL  Orientation: oriented to person, place, time/date, situation, day of week, month of year, and year  Attention: Good  Concentration: Good  Memory: WNL  Fund of knowledge:  Good  Insight:   Good  Judgment:  Good  Impulse Control: Good   Risk Assessment: Danger to Self:  No Self-injurious Behavior: No Danger to Others: No Duty to Warn:no Physical Aggression / Violence:No  Access to Firearms a concern: No  Gang Involvement:No   Subjective: The patient said she is feeling better because she has completed all the one of the blankets that she was needing for family members.  Each has a special mainly because of color and relationship to the person she is giving it to.  She is excited to be able to do this since that has been very good therapy for her.  For the most part everybody is feeling better physically in terms of the virus that they had but insurance would not approve any more sessions for her leg so she is doing stretching at home as they instructed her which helps a little but she will still significant pain in her leg.  Her primary care doctor as well as her physical  therapist of the ones who have tried to help but she said she no longer has a connection to the cardiologist office and is looking for options thinking there needs to be an ultrasound or something done.  Her PCP suggested a possible blood clot to the patient's then cardiologist but he said that was not it so he did not order any further test.  She is maintaining a positive spirit as much as she can and will start looking to see what other options are in terms of cardiology long-term.  She says when all of the heart issues that she has had she wants someone who knows her well and is invested in her care.  I encouraged her to continue using her coping skills as well as the mindfulness exercises that we have worked on to help with her stress and anxiety. She does contract for safety having no thoughts of hurting herself or anyone else. Interventions: Cognitive Behavioral Therapy  Diagnosis: Adjustment disorder with mixed anxiety and depressed mood.  Plan: I will meet with the patient every 2 weeks via video session TX. Plan:To use cognitive behavioral therapy principles as well as elements of dialectical behavior therapy.  Goals are to reduce anxiety and depression by at least 50% with a target date of November 06, 2022.  Goals are to have less sadness as indicated by PH-9 scores as well as patient report.  We also work on improving mood and  return to a healthier level of functioning as defined by her goals for being happy, identify causes and process triggers for depressed mood.  We will use cognitive behavioral therapy to explore and replace unhealthy thoughts and behavior patterns contributing to depression.  I will continue to encourage shearing of feelings related to the causes and symptoms of depression as well as teach and encouraged use of coping skills for management of depressive symptoms.  We also will work to improve the patient's ability to manage anxiety symptoms and better handle stress, identify  causes for anxiety and explore ways for reduction of anxiety in addition to resolving conflicts contributing to anxiety and managing thoughts and worrisome thinking is contributing to anxiety.  Interventions will include providing education about anxiety, facilitate problem solution skills, teaching coping skills for managing anxiety such as grounding exercises, progressive muscle relaxation and cognitive re framing etc.  We will also use cognitive behavioral therapy to identify and change anxiety provoking thoughts and behavior patterns as well as using DBT distress tolerance and mindfulness skills. I reviewed the treatment plan goals with the patient who agreed to continue with goals as stated above. Progress: 40%New target date will be May 05, 2024 Lorrene CHRISTELLA Hasten, [facility]                                                Lorrene CHRISTELLA Hasten, [facility]               Lorrene CHRISTELLA Hasten, [facility]               Lorrene CHRISTELLA Hasten, [facility]               Lorrene CHRISTELLA Hasten, [facility]               Lorrene CHRISTELLA Hasten, [facility]               Lorrene CHRISTELLA Hasten, [facility]               Lorrene CHRISTELLA Hasten, [facility]               Lorrene CHRISTELLA Hasten, [facility]               Lorrene CHRISTELLA Hasten, [facility]               Lorrene CHRISTELLA Hasten, [facility]               Lorrene CHRISTELLA Hasten, [facility]               Lorrene CHRISTELLA Hasten, [facility]               Lorrene CHRISTELLA Hasten, [facility]               Lorrene CHRISTELLA Hasten, [facility]               Lorrene CHRISTELLA Hasten, [facility]               Lorrene CHRISTELLA Hasten, [facility]               Lorrene CHRISTELLA Hasten, [facility]               Lorrene CHRISTELLA Hasten, [facility]               Lorrene CHRISTELLA Hasten, [facility]  Lorrene CHRISTELLA Hasten, [facility]               Lorrene CHRISTELLA Hasten,  [facility]               Lorrene CHRISTELLA Hasten, [facility]               Lorrene CHRISTELLA Hasten, [facility]               Lorrene CHRISTELLA Hasten, [facility]               Lorrene CHRISTELLA Hasten, [facility]               Lorrene CHRISTELLA Hasten, [facility]               Lorrene CHRISTELLA Hasten, [facility]               Lorrene CHRISTELLA Hasten, [facility]               Lorrene CHRISTELLA Hasten, [facility] "

## 2023-12-30 ENCOUNTER — Ambulatory Visit: Admitting: Behavioral Health

## 2023-12-30 ENCOUNTER — Encounter: Payer: Self-pay | Admitting: Behavioral Health

## 2023-12-30 ENCOUNTER — Ambulatory Visit: Admitting: Physical Therapy

## 2023-12-30 DIAGNOSIS — F4323 Adjustment disorder with mixed anxiety and depressed mood: Secondary | ICD-10-CM | POA: Diagnosis not present

## 2023-12-30 NOTE — Progress Notes (Signed)
 "  [facility] Counselor/Therapist Progress Note  Patient ID: Shannon [name], MRN: [id number],    Date: December 30, 2023  Time Spent: 10:53 AM until 11:41 AM, 48 minutes.   This session was held via audio teletherapy..  The patient consented to the video teletherapy and was located in her home during this session. She is aware it is the responsibility of the patient to secure confidentiality on her end of the session. The provider was in a private home office for the duration of this session.     Reported Symptoms: Anxiety, depression  Mental Status Exam: Appearance:  Well Groomed     Behavior: Appropriate  Motor: Normal  Speech/Language:  Clear and Coherent  Affect: Appropriate for  Mood: normal  Thought process: normal  Thought content:   WNL  Sensory/Perceptual disturbances:   WNL  Orientation: oriented to person, place, time/date, situation, day of week, month of year, and year  Attention: Good  Concentration: Good  Memory: WNL  Fund of knowledge:  Good  Insight:   Good  Judgment:  Good  Impulse Control: Good   Risk Assessment: Danger to Self:  No Self-injurious Behavior: No Danger to Others: No Duty to Warn:no Physical Aggression / Violence:No  Access to Firearms a concern: No  Gang Involvement:No   Subjective: The patient was in a reflective mood today.  She finished noting all of the blankets for her family members and is pleased with how it turned out and is hopeful they will appreciate all the love that went into the stitches.  She got the weekend to herself because her daughter visited some friends over the weekend and that was stressful for the patient and good for the daughter.  They got to see the latest ultrasound of the baby which is due in early June and she said it was a perfect picture where the baby was looking right at them.  She has had some sort of virus and her grandson brings thing in from daycare on a regular basis but she knows they  have some time to rest and relax over the next week.  She continues to work on putting this year behind her and has a positive attitude related to next year and has some ideas of what she wants that to look like believing she is going to keep pushing past her health issues.  She does contract for safety having no thoughts of hurting herself or anyone else. Interventions: Cognitive Behavioral Therapy  Diagnosis: Adjustment disorder with mixed anxiety and depressed mood.  Plan: I will meet with the patient every 2 weeks via video session TX. Plan:To use cognitive behavioral therapy principles as well as elements of dialectical behavior therapy.  Goals are to reduce anxiety and depression by at least 50% with a target date of November 06, 2022.  Goals are to have less sadness as indicated by PH-9 scores as well as patient report.  We also work on improving mood and return to a healthier level of functioning as defined by her goals for being happy, identify causes and process triggers for depressed mood.  We will use cognitive behavioral therapy to explore and replace unhealthy thoughts and behavior patterns contributing to depression.  I will continue to encourage shearing of feelings related to the causes and symptoms of depression as well as teach and encouraged use of coping skills for management of depressive symptoms.  We also will work to improve the patient's ability to manage anxiety symptoms and  better handle stress, identify causes for anxiety and explore ways for reduction of anxiety in addition to resolving conflicts contributing to anxiety and managing thoughts and worrisome thinking is contributing to anxiety.  Interventions will include providing education about anxiety, facilitate problem solution skills, teaching coping skills for managing anxiety such as grounding exercises, progressive muscle relaxation and cognitive re framing etc.  We will also use cognitive behavioral therapy to identify  and change anxiety provoking thoughts and behavior patterns as well as using DBT distress tolerance and mindfulness skills. I reviewed the treatment plan goals with the patient who agreed to continue with goals as stated above. Progress: 40%New target date will be May 05, 2024 Lorrene CHRISTELLA Hasten, [facility]                                                Lorrene CHRISTELLA Hasten, [facility]               Lorrene CHRISTELLA Hasten, [facility]               Lorrene CHRISTELLA Hasten, [facility]               Lorrene CHRISTELLA Hasten, [facility]               Lorrene CHRISTELLA Hasten, [facility]               Lorrene CHRISTELLA Hasten, [facility]               Lorrene CHRISTELLA Hasten, [facility]               Lorrene CHRISTELLA Hasten, [facility]               Lorrene CHRISTELLA Hasten, [facility]               Lorrene CHRISTELLA Hasten, [facility]               Lorrene CHRISTELLA Hasten, [facility]               Lorrene CHRISTELLA Hasten, [facility]               Lorrene CHRISTELLA Hasten, [facility]               Lorrene CHRISTELLA Hasten, [facility]               Lorrene CHRISTELLA Hasten, [facility]               Lorrene CHRISTELLA Hasten, [facility]               Lorrene CHRISTELLA Hasten, [facility]               Lorrene CHRISTELLA Hasten, [facility]               Lorrene CHRISTELLA Hasten, [facility]               Lorrene CHRISTELLA Hasten, [facility]               Lorrene CHRISTELLA Hasten, [facility]               Lorrene CHRISTELLA Hasten, [facility]               Lorrene CHRISTELLA Hasten, [facility]               Lorrene CHRISTELLA Hasten, [facility]  Lorrene CHRISTELLA Hasten, [facility]               Lorrene CHRISTELLA Hasten, [facility]               Lorrene CHRISTELLA Hasten, [facility]               Lorrene CHRISTELLA Hasten, [facility]               Lorrene CHRISTELLA Hasten, [facility]               Lorrene CHRISTELLA Hasten, [facility] "

## 2024-01-04 ENCOUNTER — Other Ambulatory Visit: Payer: Self-pay
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
## 2024-01-04 NOTE — Patient Instructions (Signed)
 Visit Information  Thank you for taking time to visit with me today. Please don't hesitate to contact me if I can be of assistance to you before our next scheduled appointment.  Your next care management appointment is by telephone on 02/01/24 at 11 AM  Please call the care guide team at [phone] if you need to cancel, schedule, or reschedule an appointment.   Please call the Suicide and Crisis Lifeline: 988 call 1-800-273-TALK (toll free, 24 hour hotline) if you are experiencing a Mental Health or Behavioral Health Crisis or need someone to talk to.  Rosaline Finlay, RN MSN [place]  [facility] RN Care Manager Direct Dial: [phone]  Fax: [phone]  Following is a copy of your care plan:   Goals Addressed             This Visit's Progress    manage chronic pain [facility] RN Care Plan   No change    Problems:  Chronic Disease Management support and education needs related to chronic pain   Goal: Over the next 30 days the Patient will experience decrease pain  as evidenced by patient report of pain <5/10, no longer waking her up from sleeping   Interventions:   Pain Interventions: Pain assessment performed Counseled on the importance of reporting any/all new or changed pain symptoms or management strategies to pain management provider Discussed use of relaxation techniques and/or diversional activities to assist with pain reduction (distraction, imagery, relaxation, massage, acupressure, TENS, heat, and cold application Reviewed with patient prescribed pharmacological and nonpharmacological pain relief strategies Advised patient to discuss ongoing pain and options for additional testing/evaluation with provider Screening for signs and symptoms of depression related to chronic disease state  Patient reports PT sessions have been cancelled due to insurance no longer covering. Advised to continue to perform exercises at home. Discussed PCP recommendation for  orthopedic or pain management evaluation if pain continues. Offered to start 3-way call to PCP office to request follow-up appointment to discuss ongoing pain. Patient reports she will call herself  Patient Self-Care Activities:  Attend all scheduled provider appointments Call pharmacy for medication refills 3-7 days in advance of running out of medications Call provider office for new concerns or questions  Perform all self care activities independently  Schedule PCP follow-up to discuss ongoing pain  Plan:  Telephone follow up appointment with care management team member scheduled for:  02/01/24 at 11 AM          [facility] RN Care Plan   On track    Problems:  Chronic Disease Management support and education needs related to CHF and HTN  Goal: Over the next 30 days the Patient will demonstrate a decrease CHF in exacerbations as evidenced by patient report of stable weight and symptoms related to fluid retention demonstrate Ongoing adherence to prescribed treatment plan for HTN as evidenced by patient report of home BP readings within goal of <130/80  Interventions:   Heart Failure Interventions: Provided education on low sodium diet Discussed importance of daily weight and advised patient to weigh and record daily Reviewed role of diuretics in prevention of fluid overload and management of heart failure; Reviewed symptoms related to fluid retention such as shortness of breath Reminded patient of upcoming ECHO and cardiology follow-up Discussed fluid intake of 6-8 cups (48-64 oz) per day unless directed otherwise by cardiology  Hypertension Interventions: Last practice recorded BP readings:  BP Readings from Last 3 Encounters:  11/04/23 122/70  10/20/23 126/63  10/15/23 ROLLEN)  148/83   Most recent eGFR/CrCl:  Lab Results  Component Value Date   EGFR 72 08/19/2023    No components found for: CRCL  Evaluation of current treatment plan related to hypertension self management and  patient's adherence to plan as established by provider Counseled on the importance of exercise goals with target of 150 minutes per week Advised patient, providing education and rationale, to monitor blood pressure daily and record, calling PCP for findings outside established parameters Provided education on prescribed diet low sodium Educated patient on goal BP <130/80  General Interventions Reminded patient to reschedule missed dental appointment. Patient reports she has plans to find a new dentist and eye doctor closer to her home  Patient Self-Care Activities:  Attend all scheduled provider appointments Call provider office for new concerns or questions  Perform all self care activities independently  Perform IADL's (shopping, preparing meals, housekeeping, managing finances) independently Take medications as prescribed   call office if I gain more than 2 pounds in one day or 5 pounds in one week use salt in moderation watch for swelling in feet, ankles and legs every day weigh myself daily follow rescue plan if symptoms flare-up check blood pressure daily keep a blood pressure log take blood pressure log to all doctor appointments call doctor for signs and symptoms of high blood pressure  Plan:  Telephone follow up appointment with care management team member scheduled for:  02/01/24 at 11 AM             Patient verbalized understanding of Care plan and visit instructions communicated this visit

## 2024-01-11 ENCOUNTER — Ambulatory Visit

## 2024-01-11 ENCOUNTER — Encounter (HOSPITAL_BASED_OUTPATIENT_CLINIC_OR_DEPARTMENT_OTHER): Payer: Self-pay | Admitting: Family Medicine

## 2024-01-11 DIAGNOSIS — M79604 Pain in right leg: Secondary | ICD-10-CM

## 2024-01-11 NOTE — Telephone Encounter (Signed)
 Mychart message sent by pt. Pt is a pt of Alexis's. With her still out of the office, routing mychart to Dr. [place] for review to determine if pt's next step should be referral to orthopedics or if this should just wait until Thersia returns for her to review. Please advise.

## 2024-01-14 ENCOUNTER — Ambulatory Visit: Admitting: Physical Therapy

## 2024-01-14 ENCOUNTER — Other Ambulatory Visit: Payer: Self-pay | Admitting: Family Medicine

## 2024-01-14 DIAGNOSIS — Z1231 Encounter for screening mammogram for malignant neoplasm of breast: Secondary | ICD-10-CM

## 2024-01-15 ENCOUNTER — Ambulatory Visit: Admitting: Behavioral Health

## 2024-01-15 ENCOUNTER — Encounter: Payer: Self-pay | Admitting: Behavioral Health

## 2024-01-15 DIAGNOSIS — F4323 Adjustment disorder with mixed anxiety and depressed mood: Secondary | ICD-10-CM

## 2024-01-15 NOTE — Progress Notes (Signed)
 "  [facility] Counselor/Therapist Progress Note  Patient ID: Shannon [name], MRN: [id number],    Date: January 15, 2024  Time Spent: 3 PM until 3:58 PM, 58 minutes.   This session was held via audio teletherapy..  The patient consented to the video teletherapy and was located in her home during this session. She is aware it is the responsibility of the patient to secure confidentiality on her end of the session. The provider was in a private home office for the duration of this session.     Reported Symptoms: Anxiety, depression  Mental Status Exam: Appearance:  Well Groomed     Behavior: Appropriate  Motor: Normal  Speech/Language:  Clear and Coherent  Affect: Appropriate for  Mood: normal  Thought process: normal  Thought content:   WNL  Sensory/Perceptual disturbances:   WNL  Orientation: oriented to person, place, time/date, situation, day of week, month of year, and year  Attention: Good  Concentration: Good  Memory: WNL  Fund of knowledge:  Good  Insight:   Good  Judgment:  Good  Impulse Control: Good   Risk Assessment: Danger to Self:  No Self-injurious Behavior: No Danger to Others: No Duty to Warn:no Physical Aggression / Violence:No  Access to Firearms a concern: No  Gang Involvement:No   Subjective: Per the most part the patient and her family had a good Christmas.  She did use the time to heal and is feeling better with all the virus type symptoms.  She has taken the time to get some appointment set up with her primary care physician as well as an orthopedic surgeon to look at her leg so there is some optimism there.  She continues to struggle with a cardiologist.  She does have an appointment in February with a new cardiologist and she is hopeful that will be a good fit.  She said she has had some very clear boundaries with her family member in terms of how much she can do around the house and they are respectful of those.  She is working on  haematologist and emotional boundaries especially with family members who do not know the full story of all the 1 on last year and letting go of what they think about that.  We talked about some different ways to challenge those thoughts. She does contract for safety having no thoughts of hurting herself or anyone else. Interventions: Cognitive Behavioral Therapy  Diagnosis: Adjustment disorder with mixed anxiety and depressed mood.  Plan: I will meet with the patient every 2 weeks via video session TX. Plan:To use cognitive behavioral therapy principles as well as elements of dialectical behavior therapy.  Goals are to reduce anxiety and depression by at least 50% with a target date of November 06, 2022.  Goals are to have less sadness as indicated by PH-9 scores as well as patient report.  We also work on improving mood and return to a healthier level of functioning as defined by her goals for being happy, identify causes and process triggers for depressed mood.  We will use cognitive behavioral therapy to explore and replace unhealthy thoughts and behavior patterns contributing to depression.  I will continue to encourage shearing of feelings related to the causes and symptoms of depression as well as teach and encouraged use of coping skills for management of depressive symptoms.  We also will work to improve the patient's ability to manage anxiety symptoms and better handle stress, identify causes for anxiety  and explore ways for reduction of anxiety in addition to resolving conflicts contributing to anxiety and managing thoughts and worrisome thinking is contributing to anxiety.  Interventions will include providing education about anxiety, facilitate problem solution skills, teaching coping skills for managing anxiety such as grounding exercises, progressive muscle relaxation and cognitive re framing etc.  We will also use cognitive behavioral therapy to identify and change anxiety provoking  thoughts and behavior patterns as well as using DBT distress tolerance and mindfulness skills. I reviewed the treatment plan goals with the patient who agreed to continue with goals as stated above. Progress: 40%New target date will be May 05, 2024 Lorrene CHRISTELLA Hasten, [facility]                                                Lorrene CHRISTELLA Hasten, [facility]               Lorrene CHRISTELLA Hasten, [facility]               Lorrene CHRISTELLA Hasten, [facility]               Lorrene CHRISTELLA Hasten, [facility]               Lorrene CHRISTELLA Hasten, [facility]               Lorrene CHRISTELLA Hasten, [facility]               Lorrene CHRISTELLA Hasten, [facility]               Lorrene CHRISTELLA Hasten, [facility]               Lorrene CHRISTELLA Hasten, [facility]               Lorrene CHRISTELLA Hasten, [facility]               Lorrene CHRISTELLA Hasten, [facility]               Lorrene CHRISTELLA Hasten, [facility]               Lorrene CHRISTELLA Hasten, [facility]               Lorrene CHRISTELLA Hasten, [facility]               Lorrene CHRISTELLA Hasten, [facility]               Lorrene CHRISTELLA Hasten, [facility]               Lorrene CHRISTELLA Hasten, [facility]               Lorrene CHRISTELLA Hasten, [facility]               Lorrene CHRISTELLA Hasten, [facility]               Lorrene CHRISTELLA Hasten, [facility]               Lorrene CHRISTELLA Hasten, [facility]               Lorrene CHRISTELLA Hasten, [facility]               Lorrene CHRISTELLA Hasten, [facility]               Lorrene CHRISTELLA Hasten, [facility]               Lorrene CHRISTELLA Hasten, [facility]  Lorrene CHRISTELLA Hasten, [facility]               Lorrene CHRISTELLA Hasten, [facility]               Lorrene CHRISTELLA Hasten, [facility]               Lorrene CHRISTELLA Hasten, [facility]               Lorrene CHRISTELLA Hasten, [facility]               Lorrene CHRISTELLA Hasten, [facility] "

## 2024-01-18 ENCOUNTER — Ambulatory Visit

## 2024-01-19 ENCOUNTER — Ambulatory Visit

## 2024-01-19 DIAGNOSIS — I495 Sick sinus syndrome: Secondary | ICD-10-CM

## 2024-01-20 LAB — CUP PACEART REMOTE DEVICE CHECK
Battery Remaining Longevity: 146 mo
Battery Voltage: 3.1 V
Brady Statistic AP VP Percent: 96.2 %
Brady Statistic AP VS Percent: 0.01 %
Brady Statistic AS VP Percent: 3.4 %
Brady Statistic AS VS Percent: 0.39 %
Brady Statistic RA Percent Paced: 90.47 %
Brady Statistic RV Percent Paced: 99.6 %
Date Time Interrogation Session: 20260112211929
Implantable Lead Connection Status: 753985
Implantable Lead Connection Status: 753985
Implantable Lead Implant Date: 20250303
Implantable Lead Implant Date: 20250303
Implantable Lead Location: 753859
Implantable Lead Location: 753860
Implantable Lead Model: 3830
Implantable Lead Model: 5076
Implantable Pulse Generator Implant Date: 20250303
Lead Channel Impedance Value: 323 Ohm
Lead Channel Impedance Value: 380 Ohm
Lead Channel Impedance Value: 418 Ohm
Lead Channel Impedance Value: 532 Ohm
Lead Channel Pacing Threshold Amplitude: 0.375 V
Lead Channel Pacing Threshold Amplitude: 0.75 V
Lead Channel Pacing Threshold Pulse Width: 0.4 ms
Lead Channel Pacing Threshold Pulse Width: 0.4 ms
Lead Channel Sensing Intrinsic Amplitude: 1.875 mV
Lead Channel Sensing Intrinsic Amplitude: 1.875 mV
Lead Channel Sensing Intrinsic Amplitude: 13 mV
Lead Channel Sensing Intrinsic Amplitude: 13 mV
Lead Channel Setting Pacing Amplitude: 1.5 V
Lead Channel Setting Pacing Amplitude: 1.5 V
Lead Channel Setting Pacing Pulse Width: 0.4 ms
Lead Channel Setting Sensing Sensitivity: 1.2 mV
Zone Setting Status: 755011
Zone Setting Status: 755011

## 2024-01-21 ENCOUNTER — Ambulatory Visit

## 2024-01-22 ENCOUNTER — Ambulatory Visit: Admitting: Behavioral Health

## 2024-01-22 ENCOUNTER — Ambulatory Visit: Payer: Self-pay | Admitting: Cardiology

## 2024-01-22 ENCOUNTER — Encounter: Payer: Self-pay | Admitting: Behavioral Health

## 2024-01-22 DIAGNOSIS — F4323 Adjustment disorder with mixed anxiety and depressed mood: Secondary | ICD-10-CM

## 2024-01-22 NOTE — Progress Notes (Signed)
 "  [facility] Counselor/Therapist Progress Note  Patient ID: Shannon [name], MRN: [id number],    Date: January 22, 2024  Time Spent: 3 PM until 3:55 PM, 55 minutes.   This session was held via audio teletherapy..  The patient consented to the video teletherapy and was located in her home during this session. She is aware it is the responsibility of the patient to secure confidentiality on her end of the session. The provider was in a private home office for the duration of this session.     Reported Symptoms: Anxiety, depression  Mental Status Exam: Appearance:  Well Groomed     Behavior: Appropriate  Motor: Normal  Speech/Language:  Clear and Coherent  Affect: Appropriate for  Mood: normal  Thought process: normal  Thought content:   WNL  Sensory/Perceptual disturbances:   WNL  Orientation: oriented to person, place, time/date, situation, day of week, month of year, and year  Attention: Good  Concentration: Good  Memory: WNL  Fund of knowledge:  Good  Insight:   Good  Judgment:  Good  Impulse Control: Good   Risk Assessment: Danger to Self:  No Self-injurious Behavior: No Danger to Others: No Duty to Warn:no Physical Aggression / Violence:No  Access to Firearms a concern: No  Gang Involvement:No   Subjective: The patient reports that for the most part things are going better.  Physically she is feeling better.  She did tripped over a stair and she thinks she broke her toe but she can get a shoe on again.  She got some understanding as to why the sister whom she has always had a good relationship was not speaking to her very much and open the door for her.  She now understands why and hopes that they can move forward and strengthening their relationship.  She is texting and talking to her brother more.  Going through her husband's things to get rid of them.  She is going to talk to her daughter about how to get pictures and text messages off of her phone so  they are not a constant reminder.  She feels that she is sleeping better.  She is continuing her on physical therapy and can see the difference in her leg.  She is using her coping skills to reduce anxious thoughts.  She is getting out a little bit more which was one of her New Year's resolutions.  She does contract for safety having no thoughts of hurting herself or anyone else. Interventions: Cognitive Behavioral Therapy  Diagnosis: Adjustment disorder with mixed anxiety and depressed mood.  Plan: I will meet with the patient every 2 weeks via video session TX. Plan:To use cognitive behavioral therapy principles as well as elements of dialectical behavior therapy.  Goals are to reduce anxiety and depression by at least 50% with a target date of November 06, 2022.  Goals are to have less sadness as indicated by PH-9 scores as well as patient report.  We also work on improving mood and return to a healthier level of functioning as defined by her goals for being happy, identify causes and process triggers for depressed mood.  We will use cognitive behavioral therapy to explore and replace unhealthy thoughts and behavior patterns contributing to depression.  I will continue to encourage shearing of feelings related to the causes and symptoms of depression as well as teach and encouraged use of coping skills for management of depressive symptoms.  We also will work to improve the  patient's ability to manage anxiety symptoms and better handle stress, identify causes for anxiety and explore ways for reduction of anxiety in addition to resolving conflicts contributing to anxiety and managing thoughts and worrisome thinking is contributing to anxiety.  Interventions will include providing education about anxiety, facilitate problem solution skills, teaching coping skills for managing anxiety such as grounding exercises, progressive muscle relaxation and cognitive re framing etc.  We will also use cognitive  behavioral therapy to identify and change anxiety provoking thoughts and behavior patterns as well as using DBT distress tolerance and mindfulness skills. I reviewed the treatment plan goals with the patient who agreed to continue with goals as stated above. Progress: 40%New target date will be May 05, 2024 Lorrene CHRISTELLA Hasten, [facility]                                                Lorrene CHRISTELLA Hasten, [facility]               Lorrene CHRISTELLA Hasten, [facility]               Lorrene CHRISTELLA Hasten, [facility]               Lorrene CHRISTELLA Hasten, [facility]               Lorrene CHRISTELLA Hasten, [facility]               Lorrene CHRISTELLA Hasten, [facility]               Lorrene CHRISTELLA Hasten, [facility]               Lorrene CHRISTELLA Hasten, [facility]               Lorrene CHRISTELLA Hasten, [facility]               Lorrene CHRISTELLA Hasten, [facility]               Lorrene CHRISTELLA Hasten, [facility]               Lorrene CHRISTELLA Hasten, [facility]               Lorrene CHRISTELLA Hasten, [facility]               Lorrene CHRISTELLA Hasten, [facility]               Lorrene CHRISTELLA Hasten, [facility]               Lorrene CHRISTELLA Hasten, [facility]               Lorrene CHRISTELLA Hasten, [facility]               Lorrene CHRISTELLA Hasten, [facility]               Lorrene CHRISTELLA Hasten, [facility]               Lorrene CHRISTELLA Hasten, [facility]               Lorrene CHRISTELLA Hasten, [facility]               Lorrene CHRISTELLA Hasten, [facility]               Lorrene CHRISTELLA Hasten, [facility]               Lorrene CHRISTELLA Hasten, [facility]  Lorrene CHRISTELLA Hasten, [facility]               Lorrene CHRISTELLA Hasten, [facility]               Lorrene CHRISTELLA Hasten, [facility]               Lorrene CHRISTELLA Hasten, [facility]               Lorrene CHRISTELLA Hasten, [facility]               Lorrene CHRISTELLA Hasten,  [facility]               Lorrene CHRISTELLA Hasten, [facility]               Lorrene CHRISTELLA Hasten, [facility] "

## 2024-01-25 NOTE — Progress Notes (Signed)
 Remote PPM Transmission

## 2024-01-28 ENCOUNTER — Ambulatory Visit
Admission: RE | Admit: 2024-01-28 | Discharge: 2024-01-28 | Disposition: A | Discharge status: Home / Self Care | Source: Ambulatory Visit | Attending: Family Medicine | Admitting: Family Medicine

## 2024-01-28 DIAGNOSIS — Z1231 Encounter for screening mammogram for malignant neoplasm of breast: Secondary | ICD-10-CM

## 2024-01-29 ENCOUNTER — Encounter: Payer: Self-pay | Admitting: Behavioral Health

## 2024-01-29 ENCOUNTER — Ambulatory Visit: Admitting: Behavioral Health

## 2024-01-29 DIAGNOSIS — F4323 Adjustment disorder with mixed anxiety and depressed mood: Secondary | ICD-10-CM | POA: Diagnosis not present
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
## 2024-02-01 ENCOUNTER — Encounter (HOSPITAL_COMMUNITY): Payer: Self-pay

## 2024-02-01 ENCOUNTER — Other Ambulatory Visit: Payer: Self-pay
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
## 2024-02-01 NOTE — Patient Instructions (Signed)
 Visit Information  Thank you for taking time to visit with me today. Please don't hesitate to contact me if I can be of assistance to you before our next scheduled appointment.  Your next care management appointment is by telephone on 02/08/24 at 3 PM  Please make sure you reach out to the heart failure clinic today to report symptoms of fluid retention! I have attached some educational resources regarding heart failure below.   Please call the care guide team at [phone] if you need to cancel, schedule, or reschedule an appointment.   Please call the Suicide and Crisis Lifeline: 988 call 1-800-273-TALK (toll free, 24 hour hotline) if you are experiencing a Mental Health or Behavioral Health Crisis or need someone to talk to.  Rosaline Finlay, RN MSN [place]  [facility] RN Care Manager Direct Dial: [phone]  Fax: [phone]  Following is a copy of your care plan:   Goals Addressed             This Visit's Progress    manage chronic pain [facility] RN Care Plan   No change    Problems:  Chronic Disease Management support and education needs related to chronic pain   Goal: Over the next 30 days the Patient will experience decrease pain  as evidenced by patient report of pain <5/10, no longer waking her up from sleeping   Interventions:   Pain Interventions: Pain assessment performed Counseled on the importance of reporting any/all new or changed pain symptoms or management strategies to pain management provider Discussed use of relaxation techniques and/or diversional activities to assist with pain reduction (distraction, imagery, relaxation, massage, acupressure, TENS, heat, and cold application Screening for signs and symptoms of depression related to chronic disease state  Ensured patient is aware of upcoming appointment with orthopedic provider to evaluate right leg pain: 02/10/24  Patient Self-Care Activities:  Attend all scheduled provider  appointments Call pharmacy for medication refills 3-7 days in advance of running out of medications Call provider office for new concerns or questions  Perform all self care activities independently  Attend visit with orthopedic provider to discuss ongoing pain  Plan:  Telephone follow up appointment with care management team member scheduled for:  02/08/24 at 3 PM          [facility] RN Care Plan   On track    Problems:  Chronic Disease Management support and education needs related to CHF and HTN  Goal: Over the next 30 days the Patient will demonstrate a decrease CHF in exacerbations as evidenced by patient report of stable weight and symptoms related to fluid retention demonstrate Ongoing adherence to prescribed treatment plan for HTN as evidenced by patient report of home BP readings within goal of <130/80 verbalize basic understanding of CHF disease process and self health management plan as evidenced by patient verbalization of heart failure action plan symptoms to monitor for and next steps  Interventions:   Heart Failure Interventions: Provided education on low sodium diet Reviewed Heart Failure Action Plan in depth and provided written copy Discussed importance of daily weight and advised patient to weigh and record daily Reviewed role of diuretics in prevention of fluid overload and management of heart failure; Reviewed symptoms related to fluid retention such as shortness of breath, abdominal swelling/discomfort Reminded patient of upcoming ECHO and cardiology follow-up 02/08/24 Advised patient to contact heart failure clinic today regarding reported symptoms of abdominal swelling, shortness of breath, and fatigue. Patient reports her weight has gone  up a little bit the past few days. Ensured patient has been taking lasix  as ordered  Hypertension Interventions: Last practice recorded BP readings:  BP Readings from Last 3 Encounters:  11/04/23 122/70  10/20/23 126/63  10/15/23  (!) 148/83   Most recent eGFR/CrCl:  Lab Results  Component Value Date   EGFR 72 08/19/2023    No components found for: CRCL  Evaluation of current treatment plan related to hypertension self management and patient's adherence to plan as established by provider Advised patient, providing education and rationale, to monitor blood pressure daily and record, calling PCP for findings outside established parameters Provided education on prescribed diet low sodium Educated patient on goal BP <130/80  General Interventions Reminded patient to reschedule missed dental appointment. Patient reports she has plans to find a new dentist and eye doctor closer to her home  Patient Self-Care Activities:  Attend all scheduled provider appointments Call provider office for new concerns or questions  Perform all self care activities independently  Perform IADL's (shopping, preparing meals, housekeeping, managing finances) independently Take medications as prescribed   call office if I gain more than 2 pounds in one day or 5 pounds in one week use salt in moderation watch for swelling in feet, ankles and legs every day weigh myself daily follow rescue plan if symptoms flare-up check blood pressure daily keep a blood pressure log take blood pressure log to all doctor appointments call doctor for signs and symptoms of high blood pressure  Plan:  Telephone follow up appointment with care management team member scheduled for:  02/08/24 at 3 PM             Patient verbalized understanding of Care plan and visit instructions communicated this visit  Heart Failure Action Plan A heart failure action plan helps you know what to do when you have symptoms of heart failure. Your action plan is a color-coded plan that lists the symptoms to watch for and indicates what actions to take. If you have symptoms in the green zone, you're doing well. If you have symptoms in the yellow zone, you're having  problems. If you have symptoms in the red zone, you need medical care right away. Follow the plan that was created by you and your health care provider. Review your plan each time you visit your provider. Green zone These signs mean you're doing well and can continue what you're doing: You don't have new or worsening shortness of breath. You have very little swelling or no new swelling. Your weight is stable (no gain or loss). You have a normal activity level. You don't have chest pain or any other new symptoms. Yellow zone These signs and symptoms mean your condition may be getting worse and you should make some changes: You have trouble breathing when you're active. You have swelling in your feet or legs or have discomfort in your belly. You gain 2-3 lb (0.9-1.4 kg) in 24 hours, or 5 lb (2.3 kg) in a week. This amount may be more or less depending on your condition. You get tired easily. You have trouble sleeping. You have a dry cough. If you have any of these symptoms: Contact your provider within the next day. Your provider may adjust your medicines. Red zone These signs and symptoms mean you should get medical help right away: You have trouble breathing when resting or cannot lie flat and you need to raise your head to help you breathe. You have a dry cough that's getting  worse. You have swelling or pain in your feet or legs or discomfort in your belly that's getting worse. You suddenly gain more than 2-3 lb (0.9-1.4 kg) in 24 hours, or more than 5 lb (2.3 kg) in a week. This amount may be more or less depending on your condition. You have trouble staying awake or you feel confused. You don't have an appetite. You have worsening sadness or depression. These symptoms may be an emergency. Call 911 right away. Do not wait to see if the symptoms will go away. Do not drive yourself to the hospital. Follow these instructions at home: Take medicines only as told. Eat a heart-healthy  diet. Work with a dietitian to create an eating plan that's best for you. Weigh yourself each day. Your target weight is __________ lb (__________ kg). Call your provider if you gain more than __________ lb (__________ kg) in 24 hours, or more than __________ lb (__________ kg) in a week. Health care provider name: _____________________________________________________ Health care provider phone number: _____________________________________________________ Where to find more information American Heart Association: heart.org This information is not intended to replace advice given to you by your health care provider. Make sure you discuss any questions you have with your health care provider. Document Revised: 08/07/2022 Document Reviewed: 08/07/2022 Elsevier Patient Education  2024 Elsevier Inc.  Heart Failure: What to Know  Heart failure means that your heart isn't able to pump blood the way it should. The heart might not be able to pump enough blood and oxygen to your body tissues. Heart failure is usually a long-term condition. Be sure to take good care of yourself and follow your treatment plan. Different stages of heart failure have different treatment plans. The stages are: Stage A: At risk for heart failure. You don't have any symptoms, but you're at risk for getting heart failure. Stage B: Pre-heart failure. You don't have any symptoms, but your heart has changes that show heart failure. Stage C: Symptomatic heart failure. You have symptoms of heart failure, and your heart has changed in ways that show heart failure. Stage D: Advanced heart failure. You have symptoms that make it hard to live your daily life, and you often need to stay in the hospital because of heart failure. What are the causes? Heart failure may be caused by: High blood pressure. Coronary artery disease. This is when cholesterol and fat build up in the arteries. Heart attack. Heart valves that don't open and  close properly. Damage to the heart muscle. An infection of the heart muscle. Lung disease. What increases the risk? Getting older. The risk of heart failure goes up as a person ages. Using tobacco or nicotine products. Being overweight. Using alcohol or drugs. Having any of these conditions: Diabetes. Abnormal heart rhythms. Thyroid  problems. Chronic kidney disease. Having a family history of heart failure. Having taken medicines that can damage the heart. What are the signs or symptoms? Symptoms of heart failure include: Shortness of breath. This may happen when doing things like climbing stairs. A cough that doesn't go away. Swelling of the feet, ankles, legs, or belly. This is called edema. Losing or gaining weight for no reason. Trouble breathing when lying flat. A fast heartbeat. Other symptoms may include: Feeling tired and not having energy. Feeling dizzy or light-headed. You may feel like you're going to faint. Not wanting to eat as much as normal. Feeling like you may vomit. Feeling confused. How is this diagnosed? Heart failure may be diagnosed based on: Your  symptoms and medical history. A physical exam. Blood tests. Other tests. These may include: Chest X-ray. Electrocardiogram (ECG). Echocardiogram. Cardiac MRI. Cardiac catheterization and angiogram. How is this treated? Heart failure may be treated with: Medicines. These can be given to: Treat blood pressure, lower heart rates, or make the heart muscle pump stronger. Cause the kidneys to remove extra salt and water from the blood through your pee. Changes in your daily life. These may include: Eating a healthy diet. Staying at a healthy weight. Quitting tobacco or drug use. Limiting or avoiding alcohol. Getting regular exercise. Taking part in a cardiac rehab program. This program helps you improve your health through exercise, education, and counseling. Surgery. Surgery can be done to: Open  blocked arteries. Repair valves. Put a device in the heart. This might be a pacemaker, a device to treat abnormal heart rhythms, or a device to help the heart pump better. A heart transplant. This means getting a healthy heart from a donor. This is done when other treatments have not helped. Follow these instructions at home: Treat other conditions as told by your health care provider. These may include high blood pressure or lung disease. Learn as much as you can about heart failure. Keep all follow-up visits. Your provider will want to check on your condition. Where to find more information American Heart Association: heart.org Centers for Disease Control and Prevention: riphit.se This information is not intended to replace advice given to you by your health care provider. Make sure you discuss any questions you have with your health care provider. Document Revised: 11/04/2022 Document Reviewed: 05/26/2022 Elsevier Patient Education  2024 [employer].

## 2024-02-02 ENCOUNTER — Encounter

## 2024-02-02 ENCOUNTER — Ambulatory Visit: Admitting: Adult Health

## 2024-02-03 ENCOUNTER — Telehealth (HOSPITAL_COMMUNITY): Payer: Self-pay | Admitting: Cardiology

## 2024-02-03 NOTE — Telephone Encounter (Signed)
 Patient called again with anxious concerns regarding upcoming appt with echo 02/08/24  Questioned if echo is needed? -reports one was done in the hospital and unsure if this one would be needed  Questioned again if echo study will be done with dye? -reports she has severe reaction while in the hospital-she coded- and wants to know what steps are in place to prevent this from happening   Based on the last OV a repeat echo is needed per Dr Zenaida  -will route to provider to provide input/confirm order  -will confirm if study is needed with dye/ definity  ?Possibly iron  out NO DEFINITY  echo protocol with patient

## 2024-02-05 ENCOUNTER — Encounter (HOSPITAL_COMMUNITY): Payer: Self-pay

## 2024-02-05 ENCOUNTER — Ambulatory Visit (HOSPITAL_COMMUNITY)
Admission: RE | Admit: 2024-02-05 | Discharge: 2024-02-05 | Disposition: A | Source: Ambulatory Visit | Attending: Cardiology

## 2024-02-05 ENCOUNTER — Ambulatory Visit: Admitting: Behavioral Health

## 2024-02-05 VITALS — BP 142/82 | HR 85 | Ht 67.0 in | Wt 192.0 lb

## 2024-02-05 DIAGNOSIS — R5383 Other fatigue: Secondary | ICD-10-CM | POA: Insufficient documentation

## 2024-02-05 DIAGNOSIS — E785 Hyperlipidemia, unspecified: Secondary | ICD-10-CM | POA: Insufficient documentation

## 2024-02-05 DIAGNOSIS — R1031 Right lower quadrant pain: Secondary | ICD-10-CM | POA: Diagnosis not present

## 2024-02-05 DIAGNOSIS — Z79899 Other long term (current) drug therapy: Secondary | ICD-10-CM | POA: Diagnosis not present

## 2024-02-05 DIAGNOSIS — M7989 Other specified soft tissue disorders: Secondary | ICD-10-CM | POA: Insufficient documentation

## 2024-02-05 DIAGNOSIS — I5022 Chronic systolic (congestive) heart failure: Secondary | ICD-10-CM | POA: Insufficient documentation

## 2024-02-05 DIAGNOSIS — Z8673 Personal history of transient ischemic attack (TIA), and cerebral infarction without residual deficits: Secondary | ICD-10-CM | POA: Diagnosis not present

## 2024-02-05 DIAGNOSIS — J449 Chronic obstructive pulmonary disease, unspecified: Secondary | ICD-10-CM | POA: Insufficient documentation

## 2024-02-05 DIAGNOSIS — I2510 Atherosclerotic heart disease of native coronary artery without angina pectoris: Secondary | ICD-10-CM | POA: Insufficient documentation

## 2024-02-05 DIAGNOSIS — I10 Essential (primary) hypertension: Secondary | ICD-10-CM

## 2024-02-05 DIAGNOSIS — F419 Anxiety disorder, unspecified: Secondary | ICD-10-CM | POA: Insufficient documentation

## 2024-02-05 DIAGNOSIS — I428 Other cardiomyopathies: Secondary | ICD-10-CM | POA: Diagnosis not present

## 2024-02-05 DIAGNOSIS — Z7901 Long term (current) use of anticoagulants: Secondary | ICD-10-CM | POA: Diagnosis not present

## 2024-02-05 DIAGNOSIS — Z7984 Long term (current) use of oral hypoglycemic drugs: Secondary | ICD-10-CM | POA: Insufficient documentation

## 2024-02-05 DIAGNOSIS — M79643 Pain in unspecified hand: Secondary | ICD-10-CM | POA: Diagnosis not present

## 2024-02-05 DIAGNOSIS — I110 Hypertensive heart disease with heart failure: Secondary | ICD-10-CM | POA: Diagnosis not present

## 2024-02-05 DIAGNOSIS — M109 Gout, unspecified: Secondary | ICD-10-CM | POA: Insufficient documentation

## 2024-02-05 DIAGNOSIS — I5023 Acute on chronic systolic (congestive) heart failure: Secondary | ICD-10-CM

## 2024-02-05 DIAGNOSIS — I495 Sick sinus syndrome: Secondary | ICD-10-CM | POA: Diagnosis not present

## 2024-02-05 DIAGNOSIS — I4819 Other persistent atrial fibrillation: Secondary | ICD-10-CM | POA: Diagnosis not present

## 2024-02-05 LAB — IRON AND TIBC
Iron: 81 ug/dL (ref 28–170)
Saturation Ratios: 21 % (ref 10.4–31.8)
TIBC: 391 ug/dL (ref 250–450)
UIBC: 310 ug/dL

## 2024-02-05 LAB — COMPREHENSIVE METABOLIC PANEL WITH GFR
ALT: 19 U/L (ref 0–44)
AST: 29 U/L (ref 15–41)
Albumin: 4.3 g/dL (ref 3.5–5.0)
Alkaline Phosphatase: 104 U/L (ref 38–126)
Anion gap: 9 (ref 5–15)
BUN: 17 mg/dL (ref 8–23)
CO2: 32 mmol/L (ref 22–32)
Calcium: 9.7 mg/dL (ref 8.9–10.3)
Chloride: 100 mmol/L (ref 98–111)
Creatinine, Ser: 0.82 mg/dL (ref 0.44–1.00)
GFR, Estimated: >60 mL/min
Glucose, Bld: 67 mg/dL — ABNORMAL LOW (ref 70–99)
Potassium: 4.2 mmol/L (ref 3.5–5.1)
Sodium: 141 mmol/L (ref 135–145)
Total Bilirubin: 0.5 mg/dL (ref 0.0–1.2)
Total Protein: 7.8 g/dL (ref 6.5–8.1)

## 2024-02-05 LAB — CBC
HCT: 41.2 % (ref 36.0–46.0)
Hemoglobin: 12.9 g/dL (ref 12.0–15.0)
MCH: 28.7 pg (ref 26.0–34.0)
MCHC: 31.3 g/dL (ref 30.0–36.0)
MCV: 91.8 fL (ref 80.0–100.0)
Platelets: 287 10*3/uL (ref 150–400)
RBC: 4.49 MIL/uL (ref 3.87–5.11)
RDW: 15.7 % — ABNORMAL HIGH (ref 11.5–15.5)
WBC: 7.9 10*3/uL (ref 4.0–10.5)
nRBC: 0 % (ref 0.0–0.2)

## 2024-02-05 LAB — URIC ACID: Uric Acid, Serum: 5.1 mg/dL (ref 2.5–7.1)

## 2024-02-05 LAB — LIPID PANEL
Cholesterol: 142 mg/dL (ref 0–200)
HDL: 61 mg/dL
LDL Cholesterol: 58 mg/dL (ref 0–99)
Total CHOL/HDL Ratio: 2.3 ratio
Triglycerides: 114 mg/dL
VLDL: 23 mg/dL (ref 0–40)

## 2024-02-05 LAB — PRO BRAIN NATRIURETIC PEPTIDE: Pro Brain Natriuretic Peptide: 2721 pg/mL — ABNORMAL HIGH

## 2024-02-05 LAB — FERRITIN: Ferritin: 138 ng/mL (ref 11–307)

## 2024-02-05 MED ORDER — TORSEMIDE 20 MG PO TABS: ORAL_TABLET | ORAL | 5 refills | Status: DC

## 2024-02-05 NOTE — Telephone Encounter (Signed)
 Patient has add on appt 1/30 @ 230 -will ask clinical staff to review message with patient at appt

## 2024-02-05 NOTE — Telephone Encounter (Signed)
 Pt aware.

## 2024-02-05 NOTE — Progress Notes (Signed)
 "  ADVANCED HEART FAILURE CLINIC NOTE  Primary Care: Knute [name], FNP Primary Cardiologist: Dr. Arnetha HF Cardiologist: Dr. Zenaida  HPI: Shannon [name] is a 74 y.o. 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  Cardiac History:  - Afib diagnosed in 2017. S/P PVI ablations in 2018, 2022 - CVA in 2/23. Xarelto  discontinued & transition to apixaban .  - Has not tolerated flecainide , tikosyn , or amio - Admitted 8/25 with Afib & volume overload. TTE w/ LVEF 30-35% - PPM placed in 3/25 for tachy-brady with symptomatic pauses. AVN ablation on 09/15/23   Interval hx:   She returns today for HF follow up with daughter and grandson. Overall feeling unwell. She has been struggling with shortness of breath, weight gain, fatigue, bloating, orthopnea, and chest discomfort with position changes. Has been having high anxiety related to the weather and ice. Denies dizziness. NYHA III. Able to perform ADLs. Appetite okay. Weight at home has been going up, reports that she has not been responding to her lasix . SBP at home in 120s usually. Compliant with all medications. Wearing CPAP. Sleeps on one pillow.   Wt Readings from Last 3 Encounters:  02/05/24 87.1 kg (192 lb)  02/01/24 84.8 kg (187 lb)  01/04/24 84.6 kg (186 lb 6.4 oz)   BP (!) 142/82   Pulse 85   Ht 5' 7 (1.702 m)   Wt 87.1 kg (192 lb)   SpO2 95%   BMI 30.07 kg/m   PHYSICAL EXAM:  General: Elderly appearing. No distress  Cardiac: JVP difficult to assess. No murmurs  Abdomen: Soft, distended.  Extremities: Warm and dry.  No edema.  Neuro: A&O x3. Affect pleasant.   MDT device interrogation (personally reviewed): 1.5 h/d AF (6.2%), 3.1 h/d activity, 99.6% VP,  3.1 hr/day activity, 99.7% VP, 10% AF  ReDs reading: 37%, abnormal  DATA REVIEW  ECG: 8/25: AF  ECHO: 7/25: LVEF 30-35%, normal RV function. Global hypokinesis.   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  - S/p dual chamber PPM 3/25 & AVN ablation 9/25 - PET perfusion in 6/25 with no evidence of ischemia or infarction. Moderate coronary calcifications noted on CTPE. Low risk study overall despite reduced EF (38%) - If EF not improved after ablation, would consider coronary angio to r/o ICM - Worsening NYHA III. Volume continues to climb, not responding to lasix .  - Stop lasix . Start torsemide  60/20 mg with KCL 40/20 mEq. CMET, pBNP today - Continue olmesartan  40 mg daily (did not tolerate Entresto ) - Continue toprol  XL 100 mg daily - Continue jardiance  10 mg daily - Did not tolerate spironolactone   - Not a candidate currently for advanced therapies - Repeat echo at follow up appointment, scheduling difficulties d/t inclement weather  2. Persistent Atrial fibrillation, Tachy-Brady Syndrome - As noted above; very symptomatic / difficult to control associated tachy-brady  - did not tolerate flecainide  (fatigue), tikosyn  (prolonged QT), amio (vision changes, SOB) - s/p dc PPM in 3/25 and s/p AVN ablation in 9/25 - 2 days of >=6h AF for 2 days (1.5 h/d) on device interrogation, suspect 2/2 hypervolemic - continue eliquis  5 mg bid  3. HTN - BP well controlled - continue amlodipine  5 mg daily  5. R Groin Pain - reports since 9/25 ablation at site of access - no obvious swelling at site - check art/ven R fem US  duplex  6. Fatigue - check iron  studies  7. CAD - moderate calcifications seen on CTPE - no anginal chest pain - continue simvastatin  20 mg daily - check lipid panel/LFTs  8. Gout - ?acute flare with BLE hand pain and swelling - check uric acid  Follow up in 1-2 weeks with APP/Dr. Zenaida. Schedule first available echo + US .   Zayde Stroupe, NP 02/05/24 "

## 2024-02-05 NOTE — Patient Instructions (Addendum)
 Labs done today. We will contact you only if your labs are abnormal.  STOP taking Lasix    START Torsemide  60mg  (3 tablets) by mouth every morning and 20mg  (1 tablet) by mouth every evening.  No other medication changes were made. Please continue all current medications as prescribed.  Your physician recommends that you keep your scheduled follow-up appointment  Your physician has requested that you have a lower or upper extremity venous duplex. SCHEDULING WILL CONTACT YOU TO SCHEDULE THIS APPOINTMENT. This test is an ultrasound of the veins in the legs or arms. It looks at venous blood flow that carries blood from the heart to the legs or arms. Allow one hour for a Lower Venous exam. Allow thirty minutes for an Upper Venous exam. There are no restrictions or special instructions.  Please note: We ask at that you not bring children with you during ultrasound (echo/ vascular) testing. Due to room size and safety concerns, children are not allowed in the ultrasound rooms during exams. Our front office staff cannot provide observation of children in our lobby area while testing is being conducted. An adult accompanying a patient to their appointment will only be allowed in the ultrasound room at the discretion of the ultrasound technician under special circumstances. We apologize for any inconvenience.   If you have any questions or concerns before your next appointment please send us  a message through [employer] or call our office at [phone].    TO LEAVE A MESSAGE FOR THE NURSE SELECT OPTION 2, PLEASE LEAVE A MESSAGE INCLUDING: YOUR NAME DATE OF BIRTH CALL BACK NUMBER REASON FOR CALL**this is important as we prioritize the call backs  YOU WILL RECEIVE A CALL BACK THE SAME DAY AS LONG AS YOU CALL BEFORE 4:00 PM   Do the following things EVERYDAY: Weigh yourself in the morning before breakfast. Write it down and keep it in a log. Take your medicines as prescribed Eat low salt foods--Limit  salt (sodium) to 2000 mg per day.  Stay as active as you can everyday Limit all fluids for the day to less than 2 liters   At the [facility], you and your health needs are our priority. As part of our continuing mission to provide you with exceptional heart care, we have created designated Provider Care Teams. These Care Teams include your primary Cardiologist (physician) and Advanced Practice Providers (APPs- Physician Assistants and Nurse Practitioners) who all work together to provide you with the care you need, when you need it.   You may see any of the following providers on your designated Care Team at your next follow up: Dr Toribio Fuel Dr Ezra Shuck Dr. Morene Brownie Greig Mosses, NP Caffie Shed, [place] [facility], [place] Beckey Coe, NP Jordan Lee, NP Ellouise Class, NP Tinnie Redman, PharmD Jaun Bash, PharmD   Please be sure to bring in all your medications bottles to every appointment.    Thank you for choosing [facility]

## 2024-02-08 ENCOUNTER — Ambulatory Visit (HOSPITAL_COMMUNITY): Admitting: Cardiology

## 2024-02-08 ENCOUNTER — Ambulatory Visit (HOSPITAL_COMMUNITY)

## 2024-02-08 ENCOUNTER — Other Ambulatory Visit: Payer: Self-pay

## 2024-02-08 ENCOUNTER — Ambulatory Visit (HOSPITAL_BASED_OUTPATIENT_CLINIC_OR_DEPARTMENT_OTHER): Payer: Self-pay | Admitting: Family Medicine

## 2024-02-08 NOTE — Progress Notes (Signed)
   Your mammogram results show no evidence of breast cancer. We will plan to repeat this in 1 year for routine screening.  If you should have concerns or changes in your breasts within the next year, please let us know.   Jerre Simon, FNP-C

## 2024-02-09 ENCOUNTER — Telehealth (HOSPITAL_COMMUNITY): Payer: Self-pay

## 2024-02-09 ENCOUNTER — Encounter (HOSPITAL_COMMUNITY): Payer: Self-pay

## 2024-02-09 ENCOUNTER — Ambulatory Visit (HOSPITAL_COMMUNITY): Payer: Self-pay | Admitting: Cardiology

## 2024-02-09 DIAGNOSIS — I5032 Chronic diastolic (congestive) heart failure: Secondary | ICD-10-CM

## 2024-02-09 MED ORDER — TORSEMIDE 20 MG PO TABS: ORAL_TABLET | ORAL | 5 refills | Status: AC

## 2024-02-09 NOTE — Telephone Encounter (Addendum)
 Pt aware, agreeable, and verbalized understanding  Referral sent for Iron  infusion.  Patient stated since starting Torsemide  last Friday, her joint pain has gotten worse and is also having hearing issues. She is wondering if it a result of her Torsemide .  Forwarded to provider   ----- Message from Jordan Lee, NP sent at 02/09/2024  2:19 PM EST ----- Labs look good. Uric acid does not indicate gout. Cholesterol levels look good. Iron  levels are low, we will send referral for IV iron  as discussed in clinic visit.

## 2024-02-09 NOTE — Telephone Encounter (Signed)
 Patient referred to infusion pharmacy team for ambulatory infusion of IV iron .  Insurance - UHC Medicare  Site of care - [facility]  Dx code - I50.32  IV Iron  Therapy - Feraheme 510 mg IV x 2 Infusion appointments - Scheduling team will schedule patient as soon as possible.    Gianna Calef D. Meaghann Choo, PharmD

## 2024-02-10 ENCOUNTER — Ambulatory Visit (INDEPENDENT_AMBULATORY_CARE_PROVIDER_SITE_OTHER)

## 2024-02-10 ENCOUNTER — Ambulatory Visit (INDEPENDENT_AMBULATORY_CARE_PROVIDER_SITE_OTHER): Admitting: Orthopaedic Surgery

## 2024-02-10 ENCOUNTER — Telehealth (HOSPITAL_COMMUNITY): Payer: Self-pay | Admitting: Pharmacy Technician

## 2024-02-10 DIAGNOSIS — M25551 Pain in right hip: Secondary | ICD-10-CM | POA: Diagnosis not present

## 2024-02-10 NOTE — Telephone Encounter (Signed)
 Auth Submission: NO AUTH NEEDED Site of care: [facility] Payer: UHC MEDICARE, [place] MEDICAID Medication & CPT/J Code(s) submitted: Feraheme (ferumoxytol) [id number] Diagnosis Code: I50.23 Route of submission (phone, fax, portal):  Phone # Fax # Auth type: Buy/Bill HB Units/visits requested: 510mg  x 2 doses Reference number: [id number] Approval from: 02/10/24 to 05/09/24    Dagoberto Armour, CPhT [facility] Phone: [phone] 02/10/2024
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
## 2024-02-11 ENCOUNTER — Encounter (HOSPITAL_BASED_OUTPATIENT_CLINIC_OR_DEPARTMENT_OTHER): Payer: Self-pay | Admitting: Orthopaedic Surgery

## 2024-02-12 ENCOUNTER — Encounter: Payer: Self-pay | Admitting: Behavioral Health

## 2024-02-12 ENCOUNTER — Ambulatory Visit: Admitting: Behavioral Health

## 2024-02-12 ENCOUNTER — Telehealth (HOSPITAL_BASED_OUTPATIENT_CLINIC_OR_DEPARTMENT_OTHER): Payer: Self-pay | Admitting: Orthopaedic Surgery

## 2024-02-12 DIAGNOSIS — F4323 Adjustment disorder with mixed anxiety and depressed mood: Secondary | ICD-10-CM

## 2024-02-12 NOTE — Telephone Encounter (Signed)
 Order changed.
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
## 2024-02-12 NOTE — Telephone Encounter (Signed)
 Patient needs Mri sent to [facility] because she has an implant

## 2024-02-16 ENCOUNTER — Encounter (HOSPITAL_COMMUNITY)

## 2024-02-18 ENCOUNTER — Ambulatory Visit (HOSPITAL_COMMUNITY)

## 2024-02-19 ENCOUNTER — Ambulatory Visit: Admitting: Behavioral Health

## 2024-02-23 ENCOUNTER — Encounter (HOSPITAL_COMMUNITY)

## 2024-02-26 ENCOUNTER — Ambulatory Visit: Admitting: Behavioral Health

## 2024-03-02 ENCOUNTER — Ambulatory Visit (HOSPITAL_COMMUNITY): Admitting: Cardiology

## 2024-03-02 ENCOUNTER — Ambulatory Visit (HOSPITAL_COMMUNITY)

## 2024-03-03 ENCOUNTER — Telehealth

## 2024-03-04 ENCOUNTER — Ambulatory Visit: Admitting: Behavioral Health

## 2024-03-24 ENCOUNTER — Ambulatory Visit: Admitting: Adult Health

## 2024-03-24 ENCOUNTER — Encounter

## 2024-04-19 ENCOUNTER — Encounter

## 2024-07-19 ENCOUNTER — Encounter

## 2024-08-16 ENCOUNTER — Ambulatory Visit

## 2024-10-18 ENCOUNTER — Encounter

## 2025-01-17 ENCOUNTER — Encounter
